# Patient Record
Sex: Male | Born: 1945 | Race: White | Hispanic: No | Marital: Married | Smoking: Former smoker
Health system: Southern US, Community
[De-identification: ages and names within clinical notes are randomized; demographics above are authoritative.]

## PROBLEM LIST (undated history)

## (undated) DIAGNOSIS — Z8719 Personal history of other diseases of the digestive system: Secondary | ICD-10-CM

## (undated) DIAGNOSIS — I350 Nonrheumatic aortic (valve) stenosis: Secondary | ICD-10-CM

## (undated) DIAGNOSIS — T8859XA Other complications of anesthesia, initial encounter: Secondary | ICD-10-CM

## (undated) DIAGNOSIS — T4145XA Adverse effect of unspecified anesthetic, initial encounter: Secondary | ICD-10-CM

## (undated) DIAGNOSIS — K219 Gastro-esophageal reflux disease without esophagitis: Secondary | ICD-10-CM

## (undated) DIAGNOSIS — D469 Myelodysplastic syndrome, unspecified: Secondary | ICD-10-CM

## (undated) DIAGNOSIS — M1990 Unspecified osteoarthritis, unspecified site: Secondary | ICD-10-CM

## (undated) DIAGNOSIS — I10 Essential (primary) hypertension: Secondary | ICD-10-CM

## (undated) DIAGNOSIS — I2510 Atherosclerotic heart disease of native coronary artery without angina pectoris: Secondary | ICD-10-CM

## (undated) DIAGNOSIS — E119 Type 2 diabetes mellitus without complications: Secondary | ICD-10-CM

## (undated) DIAGNOSIS — D649 Anemia, unspecified: Secondary | ICD-10-CM

## (undated) HISTORY — DX: Atherosclerotic heart disease of native coronary artery without angina pectoris: I25.10

---
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
## 2008-04-26 ENCOUNTER — Ambulatory Visit: Payer: Self-pay | Admitting: Internal Medicine

## 2008-08-26 ENCOUNTER — Ambulatory Visit: Payer: Self-pay

## 2008-11-26 HISTORY — PX: JOINT REPLACEMENT: SHX530

## 2010-02-20 ENCOUNTER — Ambulatory Visit: Payer: Self-pay | Admitting: General Practice

## 2010-03-14 ENCOUNTER — Inpatient Hospital Stay: Payer: Self-pay | Admitting: General Practice

## 2010-03-20 ENCOUNTER — Ambulatory Visit: Payer: Self-pay | Admitting: General Practice

## 2010-03-23 ENCOUNTER — Ambulatory Visit: Payer: Self-pay | Admitting: General Practice

## 2010-03-28 ENCOUNTER — Ambulatory Visit: Payer: Self-pay

## 2010-03-30 ENCOUNTER — Ambulatory Visit: Payer: Self-pay | Admitting: General Practice

## 2010-04-06 ENCOUNTER — Encounter: Payer: Self-pay | Admitting: General Practice

## 2010-04-26 ENCOUNTER — Encounter: Payer: Self-pay | Admitting: General Practice

## 2010-11-26 HISTORY — PX: CARPAL TUNNEL RELEASE: SHX101

## 2011-03-07 ENCOUNTER — Ambulatory Visit: Payer: Self-pay | Admitting: General Practice

## 2014-08-16 DIAGNOSIS — E782 Mixed hyperlipidemia: Secondary | ICD-10-CM | POA: Insufficient documentation

## 2014-08-16 DIAGNOSIS — I10 Essential (primary) hypertension: Secondary | ICD-10-CM | POA: Insufficient documentation

## 2014-08-16 DIAGNOSIS — N289 Disorder of kidney and ureter, unspecified: Secondary | ICD-10-CM | POA: Insufficient documentation

## 2016-02-14 DIAGNOSIS — E1121 Type 2 diabetes mellitus with diabetic nephropathy: Secondary | ICD-10-CM | POA: Insufficient documentation

## 2018-07-14 DIAGNOSIS — I482 Chronic atrial fibrillation, unspecified: Secondary | ICD-10-CM | POA: Insufficient documentation

## 2018-07-14 DIAGNOSIS — I6523 Occlusion and stenosis of bilateral carotid arteries: Secondary | ICD-10-CM | POA: Insufficient documentation

## 2019-07-22 ENCOUNTER — Other Ambulatory Visit: Payer: Self-pay

## 2019-07-22 ENCOUNTER — Encounter: Payer: Self-pay | Admitting: Dietician

## 2019-07-22 ENCOUNTER — Encounter: Payer: Medicare Other | Attending: Internal Medicine | Admitting: Dietician

## 2019-07-22 VITALS — Ht 69.0 in | Wt 229.1 lb

## 2019-07-22 DIAGNOSIS — E669 Obesity, unspecified: Secondary | ICD-10-CM | POA: Diagnosis not present

## 2019-07-22 DIAGNOSIS — Z713 Dietary counseling and surveillance: Secondary | ICD-10-CM | POA: Insufficient documentation

## 2019-07-22 DIAGNOSIS — E6609 Other obesity due to excess calories: Secondary | ICD-10-CM

## 2019-07-22 DIAGNOSIS — Z6833 Body mass index (BMI) 33.0-33.9, adult: Secondary | ICD-10-CM | POA: Diagnosis not present

## 2019-07-22 NOTE — Progress Notes (Signed)
Medical Nutrition Therapy: Visit start time: 0900  end time: 1000  Assessment:  Diagnosis: obesity Past medical history: Type 2 diabetes, HTN, HLD, gout Psychosocial issues/ stress concerns: none  Preferred learning method:  Nicki [place] . Hands-on  Current weight: 229.1lbs Height: 5'9.5" Medications, supplements: reconciled list in medical record  Progress and evaluation:   Patient reports stable weight for much of his adult life; not much change even when he changes his food intake;   He is working to lose weight now due to abdominal pressure affecting his breathing; he reports having a hiatal hernia but has been advised to lose weight prior to having hernia repair surgery.  Patient does not want to count calories or weigh foods, wants simple strategy.   Diabetes is in good control per patient, most BGs in 140s and HbA1C of 7%.   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   Wife has her own business and often is working until about 7pm evenings; patient eats simple and quick meals when eating alone.     Physical activity: ADLs -- yardwork and gardening  Dietary Intake:  Usual eating pattern includes 3 meals and 1-2 snacks per day. Dining out frequency: 1 meals per week.  Breakfast: cereal; occ biscuit if away from home in am, drinks lactose free skim milk Snack: Premier protein drink Lunch: cheese crackers Snack: usually none -- occasional piece of cake  Supper: quick, easy meal wife works late -- Community education officer; chicken in crock pot + green beans, peas, other vegetables (fresh or home canned); occasionally seafood (fried); loves ice cream Snack: none Beverages: water, diet coke, coffee or tea only rarely -- decaf with Stevia; orange juice with am meds; skim milk in am and occasionally for snack  Nutrition Care Education: Topics covered: weight control Basic nutrition: basic food groups, appropriate nutrient  balance, appropriate meal and snack schedule, general nutrition guidelines    Weight control: benefits of weight control, importance of low fat and low sugar food choices; portion control; calculated energy needs for weight loss at about 1500kcal and provided guidance for 45% CHO, 25% protein, and 30% fat; balanced meal options; role of physical activity; factors affecting weight and metabolism Diabetes:  appropriate meal and snack schedule, appropriate carb intake and balance  Nutritional Diagnosis:  Ken Caryl-3.3 Overweight/obesity As related to history of excess calories.  As evidenced by patient with current BMI of 33.8.  Intervention:   Instruction and discussion as noted above.  Patient states his weight today is several pounds less than at his last MD visit.  Established goals for improved nutritional balance in meals.   Patient will work on diet changes for 1-2 months, and schedule RD follow-up later if needed.  Education Materials given:  . Plate Planner with food lists . Sample meal pattern/ menus . Snacking handout . Goals/ instructions   Learner/ who was taught:  . Patient    Level of understanding: Marland Kitchen Verbalizes/ demonstrates competency   Demonstrated degree of understanding via:   Teach back Learning barriers: . None   Willingness to learn/ readiness for change: . Eager, change in progress   Monitoring and Evaluation:  Dietary intake, exercise, BG control, and body weight      follow up: prn

## 2019-07-22 NOTE — Patient Instructions (Addendum)
   Include a source of protein with each meal; can be simple such as some nuts or lowfat cheese.   Use menus to plan for healthy nutritional balance in meals.   Stay active each day to keep up metabolism/ calorie burning.

## 2019-10-07 ENCOUNTER — Emergency Department: Payer: Medicare Other

## 2019-10-07 ENCOUNTER — Emergency Department
Admission: EM | Admit: 2019-10-07 | Discharge: 2019-10-07 | Disposition: A | Discharge status: Home / Self Care | Payer: Medicare Other | Attending: Emergency Medicine | Admitting: Emergency Medicine

## 2019-10-07 ENCOUNTER — Other Ambulatory Visit: Payer: Self-pay

## 2019-10-07 ENCOUNTER — Encounter: Payer: Self-pay | Admitting: Emergency Medicine

## 2019-10-07 DIAGNOSIS — Y9389 Activity, other specified: Secondary | ICD-10-CM | POA: Diagnosis not present

## 2019-10-07 DIAGNOSIS — R202 Paresthesia of skin: Secondary | ICD-10-CM | POA: Insufficient documentation

## 2019-10-07 DIAGNOSIS — R200 Anesthesia of skin: Secondary | ICD-10-CM | POA: Diagnosis not present

## 2019-10-07 DIAGNOSIS — Y998 Other external cause status: Secondary | ICD-10-CM | POA: Diagnosis not present

## 2019-10-07 DIAGNOSIS — M25511 Pain in right shoulder: Secondary | ICD-10-CM | POA: Diagnosis not present

## 2019-10-07 DIAGNOSIS — Y92410 Unspecified street and highway as the place of occurrence of the external cause: Secondary | ICD-10-CM | POA: Insufficient documentation

## 2019-10-07 DIAGNOSIS — M542 Cervicalgia: Secondary | ICD-10-CM | POA: Insufficient documentation

## 2019-10-07 DIAGNOSIS — Z87891 Personal history of nicotine dependence: Secondary | ICD-10-CM | POA: Diagnosis not present

## 2019-10-07 DIAGNOSIS — Z7901 Long term (current) use of anticoagulants: Secondary | ICD-10-CM | POA: Insufficient documentation

## 2019-10-07 DIAGNOSIS — Z7984 Long term (current) use of oral hypoglycemic drugs: Secondary | ICD-10-CM | POA: Insufficient documentation

## 2019-10-07 DIAGNOSIS — Z79899 Other long term (current) drug therapy: Secondary | ICD-10-CM | POA: Diagnosis not present

## 2019-10-07 MED ORDER — ONDANSETRON HCL 4 MG PO TABS: 4.0000 mg | ORAL_TABLET | Freq: Three times a day (TID) | ORAL | 0 refills | Status: AC | PRN

## 2019-10-07 MED ORDER — HYDROCODONE-ACETAMINOPHEN 5-325 MG PO TABS: 1.0000 | ORAL_TABLET | Freq: Four times a day (QID) | ORAL | 0 refills | Status: AC | PRN

## 2019-10-07 MED ORDER — ONDANSETRON 4 MG PO TBDP
4.0000 mg | ORAL_TABLET | Freq: Once | ORAL | Status: AC
  Administered 2019-10-07: 4 mg via ORAL
  Filled 2019-10-07: qty 1

## 2019-10-07 MED ORDER — OXYCODONE-ACETAMINOPHEN 5-325 MG PO TABS
1.0000 | ORAL_TABLET | Freq: Once | ORAL | Status: AC
  Administered 2019-10-07: 18:00:00 1 via ORAL
  Filled 2019-10-07: qty 1
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
## 2019-10-21 ENCOUNTER — Other Ambulatory Visit: Payer: Self-pay | Admitting: Orthopedic Surgery

## 2019-10-21 DIAGNOSIS — M25511 Pain in right shoulder: Secondary | ICD-10-CM

## 2019-10-27 ENCOUNTER — Other Ambulatory Visit: Payer: Self-pay

## 2019-10-27 ENCOUNTER — Ambulatory Visit
Admission: RE | Admit: 2019-10-27 | Discharge: 2019-10-27 | Disposition: A | Discharge status: Home / Self Care | Payer: Medicare Other | Source: Ambulatory Visit | Attending: Orthopedic Surgery | Admitting: Orthopedic Surgery

## 2019-10-27 DIAGNOSIS — M25511 Pain in right shoulder: Secondary | ICD-10-CM

## 2019-11-02 ENCOUNTER — Other Ambulatory Visit: Payer: Self-pay | Admitting: Orthopedic Surgery

## 2019-11-05 ENCOUNTER — Other Ambulatory Visit: Payer: Self-pay

## 2019-11-05 ENCOUNTER — Encounter
Admission: RE | Admit: 2019-11-05 | Discharge: 2019-11-05 | Disposition: A | Discharge status: Home / Self Care | Payer: Medicare Other | Source: Ambulatory Visit | Attending: Orthopedic Surgery | Admitting: Orthopedic Surgery

## 2019-11-05 DIAGNOSIS — Z20828 Contact with and (suspected) exposure to other viral communicable diseases: Secondary | ICD-10-CM | POA: Insufficient documentation

## 2019-11-05 DIAGNOSIS — I4891 Unspecified atrial fibrillation: Secondary | ICD-10-CM | POA: Diagnosis not present

## 2019-11-05 DIAGNOSIS — Z01818 Encounter for other preprocedural examination: Secondary | ICD-10-CM | POA: Diagnosis present

## 2019-11-05 DIAGNOSIS — R001 Bradycardia, unspecified: Secondary | ICD-10-CM | POA: Insufficient documentation

## 2019-11-05 HISTORY — DX: Unspecified osteoarthritis, unspecified site: M19.90

## 2019-11-05 HISTORY — DX: Other complications of anesthesia, initial encounter: T88.59XA

## 2019-11-05 HISTORY — DX: Gastro-esophageal reflux disease without esophagitis: K21.9

## 2019-11-05 HISTORY — DX: Personal history of other diseases of the digestive system: Z87.19

## 2019-11-05 HISTORY — DX: Type 2 diabetes mellitus without complications: E11.9

## 2019-11-05 HISTORY — DX: Essential (primary) hypertension: I10

## 2019-11-05 HISTORY — DX: Anemia, unspecified: D64.9

## 2019-11-05 LAB — HEMOGLOBIN: Hemoglobin: 9.4 g/dL — ABNORMAL LOW (ref 13.0–17.0)

## 2019-11-05 LAB — SARS CORONAVIRUS 2 (TAT 6-24 HRS): SARS Coronavirus 2: NEGATIVE

## 2019-11-05 NOTE — Pre-Procedure Instructions (Signed)
ECG 12-lead9/30/2020 [place] Component Name Value Ref Range  Vent Rate (bpm) 40   QRS Interval (msec) 70   QT Interval (msec) 452   QTc (msec) 368   Other Result Information  This result has an attachment that is not available.  Result Narrative  Atrial fibrillation with slow ventricular response Low voltage QRS Septal infarct (cited on or before 14-Jul-2018) Abnormal ECG When compared with ECG of 14-Jul-2018 11:40, No significant change was found I reviewed and concur with this report. Electronically signed [id number] MD, Darnell Level [phone]) on 09/01/2019 2:08:30 PM  Status Results Details   Encounter Summary
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
## 2019-11-05 NOTE — Patient Instructions (Signed)
Your procedure is scheduled on: 11-09-19 MONDAY Report to Same Day Surgery 2nd floor medical mall [facility] Entrance-take elevator on left to 2nd floor.  Check in with surgery information desk.) To find out your arrival time please call [phone] between 1PM - 3PM on 11-06-19 FRIDAY  Remember: Instructions that are not followed completely may result in serious medical risk, up to and including death, or upon the discretion of your surgeon and anesthesiologist your surgery may need to be rescheduled.    _x___ 1. Do not eat food after midnight the night before your procedure. NO GUM OR CANDY AFTER MIDNIGHT. You may drink WATER up to 2 hours before you are scheduled to arrive at the hospital for your procedure.  Do not drink WATER within 2 hours of your scheduled arrival to the hospital.  Type 1 and type 2 diabetics should only drink water.   ____Ensure clear carbohydrate drink on the way to the hospital for bariatric patients  _X___GATORADE G2 drink 3 hours PRIOR TO ARRIVAL TIME TO HOSPITAL     __x__ 2. No Alcohol for 24 hours before or after surgery.   __x__3. No Smoking or e-cigarettes for 24 prior to surgery.  Do not use any chewable tobacco products for at least 6 hour prior to surgery   ____  4. Bring all medications with you on the day of surgery if instructed.    __x__ 5. Notify your doctor if there is any change in your medical condition     (cold, fever, infections).    x___6. On the morning of surgery brush your teeth with toothpaste and water.  You may rinse your mouth with mouth wash if you wish.  Do not swallow any toothpaste or mouthwash.   Do not wear jewelry, make-up, hairpins, clips or nail polish.  Do not wear lotions, powders, or perfumes.   Do not shave 48 hours prior to surgery. Men may shave face and neck.  Do not bring valuables to the hospital.    [facility] is not responsible for any belongings or valuables.               Contacts, dentures or  bridgework may not be worn into surgery.  Leave your suitcase in the car. After surgery it may be brought to your room.  For patients admitted to the hospital, discharge time is determined by your  treatment team.  _  Patients discharged the day of surgery will not be allowed to drive home.  You will need someone to drive you home and stay with you the night of your procedure.    Please read over the following fact sheets that you were given:   [facility] Preparing for Surgery   _x___ TAKE THE FOLLOWING MEDICATION THE MORNING OF SURGERY WITH A SMALL SIP OF WATER. These include:  1. METOPROLOL  2.  3.  4.  5.  6.  ____Fleets enema or Magnesium Citrate as directed.   _x___ Use CHG Soap or sage wipes as directed on instruction sheet   ____ Use inhalers on the day of surgery and bring to hospital day of surgery  _X___ Stop Metformin 2 days prior to surgery-LAST DOSE ON Friday 11-06-19    ____ Take 1/2 of usual insulin dose the night before surgery and none on the morning surgery.   _x___ Follow recommendations from Cardiologist, Pulmonologist or PCP regarding stopping Aspirin, Coumadin, Plavix ,Eliquis, Effient, or Pradaxa, and Pletal-PT WAS INSTRUCTED BY OFFICE TO STOP ELIQUIS  3 DAYS PRIOR TO SURGERY-LAST DOSE TODAY (11-05-19)  X____Stop Anti-inflammatories such as Advil, Aleve, Ibuprofen, Motrin, Naproxen, Naprosyn, Goodies powders or aspirin products NOW- OK to take Tylenol    ____ Stop supplements until after surgery.    ____ Bring C-Pap to the hospital.

## 2019-11-05 NOTE — Pre-Procedure Instructions (Signed)
Progress Notes - documented in this encounter Vernon [name], Vernon [name] - 08/26/2019 11:00 AM EDT Formatting of this note might be different from the original. Established Patient Visit   Chief Complaint: Chief Complaint  Patient presents with  . Atrial Fibrillation  Date of Service: 08/26/2019 Date of Birth: [birth year] PCP: Velna Ochs, MD   History of Present Illness: Vernon [name] is a 73 y.o. male with a history of permanent atrial fibrillation, anticoagulated on Eliquis, HTN, HLD, and diabetes mellitus, who presents for a routine six month follow up visit, feeling generally well from a cardiac standpoint. However, he does report having ongoing fatigue, which has been gradually progressive during the last six to eight months. If he is not outside working, he feels very sleepy and tired during the day. He has continued to stay active despite this fatigue, working on his farm on a daily basis. He denies any obvious changes to his exercise tolerance. He also denies exertional chest pain or shortness of breath. He also denies any lightheadedness, syncope, palpitations, or leg swelling.   Of note, he is currently on metoprolol tartrate 50 mg BID for heart rate control. His heart rate is 45 BPM today. His heart rate has been similarly low during the last few visits.   Past Medical and Surgical History  Past Medical History Past Medical History:  Diagnosis Date  . Anemia  . Complete rupture of rotator cuff  . 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 Obesity  . Osteoarthrosis, unspecified whether generalized or localized, lower leg  . Renal insufficiency   Past Surgical History He has a past surgical history that includes Knee arthroscopy (Right) and Endoscopic Carpal Tunnel Release (Left).   Medications and Allergies  Current Medications  Current Outpatient Medications  Medication Sig Dispense Refill  . allopurinol (ZYLOPRIM) 300 MG tablet Take 300 mg by mouth once daily.  Marland Kitchen  apixaban (ELIQUIS) 5 mg tablet Take 1 tablet (5 mg total) by mouth 2 (two) times daily. 60 tablet 11  . ferrous sulfate 325 (65 FE) MG tablet Take 325 mg by mouth daily with breakfast  . flash glucose sensor Kit Use 1 kit as needed. 1 kit 0  . lisinopril (PRINIVIL,ZESTRIL) 40 MG tablet Take 40 mg by mouth once daily.  . metFORMIN (GLUCOPHAGE) 500 MG tablet Take 1 tablet (500 mg total) by mouth 2 (two) times daily with meals 180 tablet 3  . metoprolol tartrate (LOPRESSOR) 50 MG tablet Take 0.5 tablets (25 mg total) by mouth 2 (two) times daily 45 tablet 1  . multivit-mins/iron/folic/lycop (CENTRUM MEN ORAL) Take 1 tablet by mouth once daily  . simvastatin (ZOCOR) 80 MG tablet Take 40 mg by mouth nightly.   No current facility-administered medications for this visit.   Allergies: Patient has no known allergies.  Social and Family History  Social History reports that he quit smoking about 50 years ago. His smoking use included cigarettes. He has a 1.00 pack-year smoking history. He has never used smokeless tobacco. He reports that he does not drink alcohol.  Family History Family History  Problem Relation Age of Onset  . Stroke Mother   Review of Systems   Review of Systems:  The patient denies chest pain, shortness of breath, orthopnea, paroxysmal nocturnal dyspnea, pedal edema, palpitations, heart racing, dizziness, lightheadedness, presyncope, syncope, leg pain, leg cramping.  Review of 12 Systems is negative except as described above.  Physical Examination   Vitals:BP 112/64 (BP Location: Left upper arm, Patient Position:  Sitting, BP Cuff Size: Adult)  Pulse (!) 45  Resp 14  Ht 175.3 cm (_0 )  Wt (!) 106 kg (233 lb 9.6 oz)  SpO2 92%  BMI 34.50 kg/m  Ht:175.3 cm (_1 ) Wt:(!) 106 kg (233 lb 9.6 oz) ZLD:JTTS surface area is 2.27 meters squared. Body mass index is 34.5 kg/m.  Constitutional: Alert, well developed, in no acute distress HEENT: Pupils equally reactive to  light and accomodation  Neck: Supple. Carotid pulse 2+ bilaterally Lungs: Clear to auscultation bilaterally; no wheezes, rales, rhonchi Heart: Regular rate and rhythm. 3/6 systolic murmur heard throughout precordium with radiation to the carotid arteries.  Extremities: No lower extremity edema. No cyanosis.  Peripheral Pulses: 2+ in upper extremities, 2+ in lower extremities   Assessment   73 y.o. male with  1. Chronic a-fib (CMS-HCC)  2. Essential hypertension, benign  3. Type 2 diabetes with nephropathy (CMS-HCC)  4. Hyperlipemia, mixed   EKG as read by me: Atrial fibrillation, Rate of 40 BPM, Normal axis, diffuse T wave flattening ( no significant change from prior EKG done in 06/2018)   Plan   1. Permanent atrial fibrillation: a. Fatigue - CHADS-VASc score of 3 (age 32-74 - 1, HTN - 1, DM - 1). He remains anticoagulated with Eliquis 5 mg BID. No dose reduction needed given age <80 years, weight >60 kg, and creatinine <1.5. He reports no bleeding symptoms while on Eliquis.  - Rate controlled with metoprolol tartrate 50 mg BID. Heart rate was 45 BPM in clinic today. Review of prior office vitals shows bradycardia in the 40s during last two visits. Patient also reports increased fatigue during the last six months, so we will plan to decrease metoprolol dosing to minimize bradycardia and fatigue.  - Decrease metoprolol from 50 mg BID to 25 mg BID  - Return to clinic in 4 weeks, plan to reassess heart rate. If he remains bradycardic, will likely discontinue daily metoprolol use and only use PRN. Consider holter monitoring if patient remains bradycardic without use of beta blocker to evaluate for sick sinus syndrome.   2. HTN: Currently on lisinopril 40 mg daily and metoprolol tartrate 50 mg BID for blood pressure control. Blood pressure is well controlled today. Continue current regimen.   3. HLD: Currently on moderate intensity statin therapy with simvastatin 80 mg daily. Last lipid  panel from 02/2019 reviewed - total cholesterol 79, HDL 36, LDL 26. Tolerating statin therapy well. Continue current regimen.   4. Diabetes mellitus:  Last A1c from 05/2019 was 7.1%. Goal A1c is between <7.0% to minimize cardiovascular risk. Continue current medications, diet, and lifestyle modifications.  Orders Placed This Encounter  Procedures  . ECG 12-lead   Return in about 4 weeks (around 09/23/2019) for [place].  MICHELLE Jimmey Ralph, PA-C    Electronically signed by Vernon [name], [place] at 08/26/2019 2:12 PM EDT

## 2019-11-09 ENCOUNTER — Ambulatory Visit: Payer: Medicare Other | Admitting: Anesthesiology

## 2019-11-09 ENCOUNTER — Encounter: Payer: Self-pay | Admitting: Orthopedic Surgery

## 2019-11-09 ENCOUNTER — Ambulatory Visit: Payer: Medicare Other

## 2019-11-09 ENCOUNTER — Encounter
Admission: RE | Disposition: A | Discharge status: Home / Self Care | Payer: Self-pay | Source: Home / Self Care | Attending: Orthopedic Surgery

## 2019-11-09 ENCOUNTER — Ambulatory Visit
Admission: RE | Admit: 2019-11-09 | Discharge: 2019-11-09 | Disposition: A | Discharge status: Home / Self Care | Payer: Medicare Other | Attending: Orthopedic Surgery | Admitting: Orthopedic Surgery

## 2019-11-09 ENCOUNTER — Other Ambulatory Visit: Payer: Self-pay

## 2019-11-09 DIAGNOSIS — I10 Essential (primary) hypertension: Secondary | ICD-10-CM | POA: Diagnosis not present

## 2019-11-09 DIAGNOSIS — Z9889 Other specified postprocedural states: Secondary | ICD-10-CM

## 2019-11-09 DIAGNOSIS — Z6832 Body mass index (BMI) 32.0-32.9, adult: Secondary | ICD-10-CM | POA: Insufficient documentation

## 2019-11-09 DIAGNOSIS — Z7901 Long term (current) use of anticoagulants: Secondary | ICD-10-CM | POA: Diagnosis not present

## 2019-11-09 DIAGNOSIS — S46211A Strain of muscle, fascia and tendon of other parts of biceps, right arm, initial encounter: Secondary | ICD-10-CM | POA: Diagnosis not present

## 2019-11-09 DIAGNOSIS — S46011A Strain of muscle(s) and tendon(s) of the rotator cuff of right shoulder, initial encounter: Secondary | ICD-10-CM | POA: Insufficient documentation

## 2019-11-09 DIAGNOSIS — E669 Obesity, unspecified: Secondary | ICD-10-CM | POA: Diagnosis not present

## 2019-11-09 DIAGNOSIS — E119 Type 2 diabetes mellitus without complications: Secondary | ICD-10-CM | POA: Insufficient documentation

## 2019-11-09 DIAGNOSIS — M25811 Other specified joint disorders, right shoulder: Secondary | ICD-10-CM | POA: Insufficient documentation

## 2019-11-09 DIAGNOSIS — I4891 Unspecified atrial fibrillation: Secondary | ICD-10-CM | POA: Insufficient documentation

## 2019-11-09 DIAGNOSIS — M1990 Unspecified osteoarthritis, unspecified site: Secondary | ICD-10-CM | POA: Diagnosis not present

## 2019-11-09 HISTORY — PX: SHOULDER ARTHROSCOPY WITH ROTATOR CUFF REPAIR AND OPEN BICEPS TENODESIS: SHX6677

## 2019-11-09 LAB — GLUCOSE, CAPILLARY
Glucose-Capillary: 137 mg/dL — ABNORMAL HIGH (ref 70–99)
Glucose-Capillary: 140 mg/dL — ABNORMAL HIGH (ref 70–99)

## 2019-11-09 SURGERY — SHOULDER ARTHROSCOPY WITH ROTATOR CUFF REPAIR AND OPEN BICEPS TENODESIS
Anesthesia: General | Laterality: Right

## 2019-11-09 MED ORDER — ONDANSETRON 4 MG PO TBDP: 4.0000 mg | ORAL_TABLET | Freq: Three times a day (TID) | ORAL | 0 refills | Status: DC | PRN

## 2019-11-09 MED ORDER — BUPIVACAINE LIPOSOME 1.3 % IJ SUSP
INTRAMUSCULAR | Status: DC | PRN
  Administered 2019-11-09: 20 mL via PERINEURAL

## 2019-11-09 MED ORDER — BUPIVACAINE-EPINEPHRINE (PF) 0.5% -1:200000 IJ SOLN
INTRAMUSCULAR | Status: AC
  Filled 2019-11-09: qty 30

## 2019-11-09 MED ORDER — FAMOTIDINE 20 MG PO TABS
ORAL_TABLET | ORAL | Status: AC
  Filled 2019-11-09: qty 1

## 2019-11-09 MED ORDER — EPINEPHRINE PF 1 MG/ML IJ SOLN
INTRAMUSCULAR | Status: AC
  Filled 2019-11-09: qty 4

## 2019-11-09 MED ORDER — SODIUM CHLORIDE 0.9 % IV SOLN
INTRAVENOUS | Status: DC
  Administered 2019-11-09 (×2): via INTRAVENOUS

## 2019-11-09 MED ORDER — FAMOTIDINE 20 MG PO TABS
20.0000 mg | ORAL_TABLET | Freq: Once | ORAL | Status: AC
  Administered 2019-11-09: 20 mg via ORAL

## 2019-11-09 MED ORDER — OXYCODONE HCL 5 MG PO TABS: 5.0000 mg | ORAL_TABLET | ORAL | 0 refills | Status: DC | PRN

## 2019-11-09 MED ORDER — LIDOCAINE HCL (PF) 2 % IJ SOLN
INTRAMUSCULAR | Status: AC
  Filled 2019-11-09: qty 10

## 2019-11-09 MED ORDER — NEOMYCIN-POLYMYXIN B GU 40-200000 IR SOLN
Status: AC
  Filled 2019-11-09: qty 20

## 2019-11-09 MED ORDER — PROPOFOL 10 MG/ML IV BOLUS
INTRAVENOUS | Status: DC | PRN
  Administered 2019-11-09: 20 mg via INTRAVENOUS
  Administered 2019-11-09: 160 mg via INTRAVENOUS

## 2019-11-09 MED ORDER — CEFAZOLIN SODIUM-DEXTROSE 2-4 GM/100ML-% IV SOLN
INTRAVENOUS | Status: AC
  Filled 2019-11-09: qty 100

## 2019-11-09 MED ORDER — LIDOCAINE HCL (CARDIAC) PF 100 MG/5ML IV SOSY
PREFILLED_SYRINGE | INTRAVENOUS | Status: DC | PRN
  Administered 2019-11-09: 100 mg via INTRAVENOUS

## 2019-11-09 MED ORDER — MIDAZOLAM HCL 2 MG/2ML IJ SOLN
INTRAMUSCULAR | Status: AC
  Filled 2019-11-09: qty 2

## 2019-11-09 MED ORDER — ONDANSETRON HCL 4 MG/2ML IJ SOLN: 4.0000 mg | Freq: Once | INTRAMUSCULAR | Status: DC | PRN

## 2019-11-09 MED ORDER — FENTANYL CITRATE (PF) 100 MCG/2ML IJ SOLN
50.0000 ug | Freq: Once | INTRAMUSCULAR | Status: AC
  Administered 2019-11-09: 50 ug via INTRAVENOUS

## 2019-11-09 MED ORDER — PHENYLEPHRINE HCL (PRESSORS) 10 MG/ML IV SOLN
INTRAVENOUS | Status: AC
  Filled 2019-11-09: qty 1

## 2019-11-09 MED ORDER — CEFAZOLIN SODIUM-DEXTROSE 2-4 GM/100ML-% IV SOLN
2.0000 g | INTRAVENOUS | Status: AC
  Administered 2019-11-09: 2 g via INTRAVENOUS

## 2019-11-09 MED ORDER — EPHEDRINE SULFATE 50 MG/ML IJ SOLN
INTRAMUSCULAR | Status: DC | PRN
  Administered 2019-11-09 (×2): 5 mg via INTRAVENOUS

## 2019-11-09 MED ORDER — BUPIVACAINE LIPOSOME 1.3 % IJ SUSP
INTRAMUSCULAR | Status: AC
  Filled 2019-11-09: qty 20

## 2019-11-09 MED ORDER — FENTANYL CITRATE (PF) 100 MCG/2ML IJ SOLN: 25.0000 ug | INTRAMUSCULAR | Status: DC | PRN

## 2019-11-09 MED ORDER — CHLORHEXIDINE GLUCONATE 4 % EX LIQD
60.0000 mL | Freq: Once | CUTANEOUS | Status: AC
  Administered 2019-11-09: 4 via TOPICAL

## 2019-11-09 MED ORDER — LIDOCAINE HCL (PF) 1 % IJ SOLN
INTRAMUSCULAR | Status: DC | PRN
  Administered 2019-11-09: 3 mL

## 2019-11-09 MED ORDER — LIDOCAINE HCL (PF) 1 % IJ SOLN
INTRAMUSCULAR | Status: AC
  Filled 2019-11-09: qty 30

## 2019-11-09 MED ORDER — BUPIVACAINE HCL (PF) 0.5 % IJ SOLN
INTRAMUSCULAR | Status: DC | PRN
  Administered 2019-11-09: 10 mL

## 2019-11-09 MED ORDER — MIDAZOLAM HCL 2 MG/2ML IJ SOLN
1.0000 mg | Freq: Once | INTRAMUSCULAR | Status: AC
  Administered 2019-11-09: 1 mg via INTRAVENOUS

## 2019-11-09 MED ORDER — ACETAMINOPHEN 500 MG PO TABS: 1000.0000 mg | ORAL_TABLET | Freq: Three times a day (TID) | ORAL | 2 refills | Status: DC

## 2019-11-09 MED ORDER — SUGAMMADEX SODIUM 200 MG/2ML IV SOLN
INTRAVENOUS | Status: AC
  Filled 2019-11-09: qty 2

## 2019-11-09 MED ORDER — ROCURONIUM BROMIDE 50 MG/5ML IV SOLN
INTRAVENOUS | Status: AC
  Filled 2019-11-09: qty 2

## 2019-11-09 MED ORDER — GLYCOPYRROLATE 0.2 MG/ML IJ SOLN
INTRAMUSCULAR | Status: AC
  Filled 2019-11-09: qty 1

## 2019-11-09 MED ORDER — ONDANSETRON HCL 4 MG/2ML IJ SOLN
INTRAMUSCULAR | Status: DC | PRN
  Administered 2019-11-09: 4 mg via INTRAVENOUS

## 2019-11-09 MED ORDER — GLYCOPYRROLATE 0.2 MG/ML IJ SOLN
INTRAMUSCULAR | Status: DC | PRN
  Administered 2019-11-09: .2 mg via INTRAVENOUS

## 2019-11-09 MED ORDER — LACTATED RINGERS IV SOLN
INTRAVENOUS | Status: DC | PRN
  Administered 2019-11-09: 08:00:00 4 mL

## 2019-11-09 MED ORDER — NEOMYCIN-POLYMYXIN B GU 40-200000 IR SOLN
Status: DC | PRN
  Administered 2019-11-09: 2 mL

## 2019-11-09 MED ORDER — BUPIVACAINE HCL (PF) 0.5 % IJ SOLN
INTRAMUSCULAR | Status: AC
  Filled 2019-11-09: qty 10

## 2019-11-09 MED ORDER — FENTANYL CITRATE (PF) 100 MCG/2ML IJ SOLN
INTRAMUSCULAR | Status: AC
  Filled 2019-11-09: qty 2

## 2019-11-09 MED ORDER — ROCURONIUM BROMIDE 100 MG/10ML IV SOLN
INTRAVENOUS | Status: DC | PRN
  Administered 2019-11-09: 20 mg via INTRAVENOUS
  Administered 2019-11-09: 80 mg via INTRAVENOUS

## 2019-11-09 MED ORDER — SUGAMMADEX SODIUM 200 MG/2ML IV SOLN
INTRAVENOUS | Status: DC | PRN
  Administered 2019-11-09: 200 mg via INTRAVENOUS

## 2019-11-09 MED ORDER — PHENYLEPHRINE HCL-NACL 10-0.9 MG/250ML-% IV SOLN
INTRAVENOUS | Status: DC | PRN
  Administered 2019-11-09: 30 ug/min via INTRAVENOUS

## 2019-11-09 MED ORDER — FENTANYL CITRATE (PF) 250 MCG/5ML IJ SOLN
INTRAMUSCULAR | Status: AC
  Filled 2019-11-09: qty 5

## 2019-11-09 MED ORDER — PROPOFOL 10 MG/ML IV BOLUS
INTRAVENOUS | Status: AC
  Filled 2019-11-09: qty 20

## 2019-11-09 MED ORDER — FENTANYL CITRATE (PF) 100 MCG/2ML IJ SOLN
INTRAMUSCULAR | Status: DC | PRN
  Administered 2019-11-09: 25 ug via INTRAVENOUS
  Administered 2019-11-09: 50 ug via INTRAVENOUS
  Administered 2019-11-09: 25 ug via INTRAVENOUS

## 2019-11-09 MED ORDER — LIDOCAINE HCL (PF) 1 % IJ SOLN
INTRAMUSCULAR | Status: AC
  Filled 2019-11-09: qty 5

## 2019-11-09 SURGICAL SUPPLY — 71 items
ADAPTER IRRIG TUBE 2 SPIKE SOL (ADAPTER) ×4 IMPLANT
ANCHOR 2.3 SP SGL 1.2 XBRAID (Anchor) ×4 IMPLANT
ANCHOR SUT BIO SW 4.75X19.1 (Anchor) ×2 IMPLANT
BRUSH SCRUB EZ  4% CHG (MISCELLANEOUS)
BRUSH SCRUB EZ 4% CHG (MISCELLANEOUS) IMPLANT
BUR BR 5.5 12 FLUTE (BURR) IMPLANT
BUR RADIUS 4.0X18.5 (BURR) ×2 IMPLANT
BUR RADIUS 5.5 (BURR) IMPLANT
CANNULA PART THRD DISP 5.75X7 (CANNULA) ×4 IMPLANT
CANNULA PARTIAL THREAD 2X7 (CANNULA) ×4 IMPLANT
CANNULA TWIST IN 8.25X9CM (CANNULA) IMPLANT
CHLORAPREP W/TINT 26 (MISCELLANEOUS) ×2 IMPLANT
COOLER ICEMAN CLASSIC (MISCELLANEOUS) ×2 IMPLANT
COVER WAND RF STERILE (DRAPES) ×2 IMPLANT
CRADLE LAMINECT ARM (MISCELLANEOUS) ×2 IMPLANT
DEVICE SUCT BLK HOLE OR FLOOR (MISCELLANEOUS) IMPLANT
DRAPE 3/4 80X56 (DRAPES) ×2 IMPLANT
DRAPE INCISE IOBAN 66X45 STRL (DRAPES) ×2 IMPLANT
DRAPE SPLIT 6X30 W/TAPE (DRAPES) ×4 IMPLANT
DRAPE STERI 35X30 U-POUCH (DRAPES) ×2 IMPLANT
DRSG TEGADERM 4X4.75 (GAUZE/BANDAGES/DRESSINGS) ×4 IMPLANT
ELECT REM PT RETURN 9FT ADLT (ELECTROSURGICAL)
ELECTRODE REM PT RTRN 9FT ADLT (ELECTROSURGICAL) IMPLANT
GAUZE SPONGE 4X4 12PLY STRL (GAUZE/BANDAGES/DRESSINGS) ×2 IMPLANT
GAUZE XEROFORM 1X8 LF (GAUZE/BANDAGES/DRESSINGS) ×2 IMPLANT
GLOVE BIO SURGEON STRL SZ7.5 (GLOVE) ×2 IMPLANT
GLOVE BIOGEL PI IND STRL 8 (GLOVE) ×2 IMPLANT
GLOVE BIOGEL PI INDICATOR 8 (GLOVE) ×2
GLOVE SURG SYN 7.5  E (GLOVE) ×1
GLOVE SURG SYN 7.5 E (GLOVE) ×1 IMPLANT
GOWN STRL REUS W/ TWL LRG LVL3 (GOWN DISPOSABLE) ×2 IMPLANT
GOWN STRL REUS W/TWL LRG LVL3 (GOWN DISPOSABLE) ×2
GOWN STRL REUS W/TWL LRG LVL4 (GOWN DISPOSABLE) ×2 IMPLANT
IV LACTATED RINGER IRRG 3000ML (IV SOLUTION) ×10
IV LR IRRIG 3000ML ARTHROMATIC (IV SOLUTION) ×10 IMPLANT
KIT STABILIZATION SHOULDER (MISCELLANEOUS) ×2 IMPLANT
KIT SUTURETAK 3.0 INSERT PERC (KITS) IMPLANT
KIT TURNOVER KIT A (KITS) ×2 IMPLANT
MANIFOLD NEPTUNE II (INSTRUMENTS) ×2 IMPLANT
MASK FACE SPIDER DISP (MASK) ×2 IMPLANT
MAT ABSORB  FLUID 56X50 GRAY (MISCELLANEOUS) ×2
MAT ABSORB FLUID 56X50 GRAY (MISCELLANEOUS) ×2 IMPLANT
NDL SAFETY ECLIPSE 18X1.5 (NEEDLE) ×1 IMPLANT
NEEDLE HYPO 18GX1.5 SHARP (NEEDLE) ×1
NEEDLE HYPO 22GX1.5 SAFETY (NEEDLE) ×2 IMPLANT
NEEDLE SCORPION MULTI FIRE (NEEDLE) ×2 IMPLANT
NS IRRIG 500ML POUR BTL (IV SOLUTION) ×2 IMPLANT
PACK ARTHROSCOPY SHOULDER (MISCELLANEOUS) ×2 IMPLANT
PAD ABD DERMACEA PRESS 5X9 (GAUZE/BANDAGES/DRESSINGS) ×2 IMPLANT
PAD WRAPON POLAR SHDR XLG (MISCELLANEOUS) ×1 IMPLANT
PENCIL SMOKE ULTRAEVAC 22 CON (MISCELLANEOUS) ×2 IMPLANT
SET TUBE SUCT SHAVER OUTFL 24K (TUBING) ×2 IMPLANT
SET TUBE TIP INTRA-ARTICULAR (MISCELLANEOUS) ×2 IMPLANT
SLING ULTRA II M (MISCELLANEOUS) IMPLANT
STRAP SAFETY 5IN WIDE (MISCELLANEOUS) ×2 IMPLANT
SUT ETHILON 3-0 FS-10 30 BLK (SUTURE) ×2
SUT LASSO 90 DEG SD STR (SUTURE) IMPLANT
SUT MNCRL 4-0 (SUTURE) ×1
SUT MNCRL 4-0 27XMFL (SUTURE) ×1
SUT PDS AB 0 CT1 27 (SUTURE) ×2 IMPLANT
SUTURE EHLN 3-0 FS-10 30 BLK (SUTURE) ×1 IMPLANT
SUTURE MNCRL 4-0 27XMF (SUTURE) ×1 IMPLANT
SUTURE TAPE 1.3 40 TPR END (SUTURE) ×2 IMPLANT
SUTURETAPE 1.3 40 TPR END (SUTURE) ×4
SYR 10ML LL (SYRINGE) ×2 IMPLANT
SYSTEM ANCHOR SUT KNTLESS 4.75 (Anchor) ×4 IMPLANT
TAPE CLOTH 3X10 WHT NS LF (GAUZE/BANDAGES/DRESSINGS) ×2 IMPLANT
TUBING ARTHRO INFLOW-ONLY STRL (TUBING) ×2 IMPLANT
TUBING CONNECTING 10 (TUBING) ×2 IMPLANT
WAND WEREWOLF FLOW 90D (MISCELLANEOUS) IMPLANT
WRAPON POLAR PAD SHDR XLG (MISCELLANEOUS) ×2
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
## 2019-11-09 NOTE — Anesthesia Post-op Follow-up Note (Signed)
Anesthesia QCDR form completed.        

## 2019-11-09 NOTE — Anesthesia Postprocedure Evaluation (Signed)
Anesthesia Post Note  Patient: Vernon [name]  Procedure(s) Performed: RIGHT SHOULDER ARTHROSCOPY WITH SUBSCAPULARIS REPAIR, SUBACROMIAL DECOMPRESSION,MINI OPEN ROTATOR CUFF REPAIR (Right )  Patient location during evaluation: PACU Anesthesia Type: General Level of consciousness: awake and alert and oriented Pain management: pain level controlled Vital Signs Assessment: post-procedure vital signs reviewed and stable Respiratory status: spontaneous breathing, nonlabored ventilation and respiratory function stable Cardiovascular status: blood pressure returned to baseline and stable Postop Assessment: no signs of nausea or vomiting Anesthetic complications: no     Last Vitals:  Vitals:   11/09/19 1210 11/09/19 1216  BP: (!) 111/54 (!) 120/48  Pulse: (!) 57 (!) 52  Resp: (!) 22 14  Temp: (!) 36.2 C 36.6 C  SpO2: 97% 93%    Last Pain:  Vitals:   11/09/19 1216  TempSrc: Temporal  PainSc: 0-No pain                 Alesandro Stueve

## 2019-11-09 NOTE — Transfer of Care (Signed)
Immediate Anesthesia Transfer of Care Note  Patient: Vernon [name]  Procedure(s) Performed: RIGHT SHOULDER ARTHROSCOPY WITH SUBSCAPULARIS REPAIR, SUBACROMIAL DECOMPRESSION,MINI OPEN ROTATOR CUFF REPAIR (Right )  Patient Location: PACU  Anesthesia Type:General  Level of Consciousness: awake, alert  and oriented  Airway & Oxygen Therapy: Patient Spontanous Breathing and Patient connected to face mask oxygen  Post-op Assessment: Report given to RN and Post -op Vital signs reviewed and stable  Post vital signs: Reviewed and stable  Last Vitals:  Vitals Value Taken Time  BP    Temp    Pulse 53 11/09/19 1054  Resp 24 11/09/19 1054  SpO2 96 % 11/09/19 1054  Vitals shown include unvalidated device data.  Last Pain:  Vitals:   11/09/19 0617  TempSrc: Tympanic  PainSc: 0-No pain         Complications: No apparent anesthesia complications

## 2019-11-09 NOTE — Anesthesia Procedure Notes (Signed)
Anesthesia Regional Block: Interscalene brachial plexus block   Pre-Anesthetic Checklist: ,, timeout performed, Correct Patient, Correct Site, Correct Laterality, Correct Procedure, Correct Position, site marked, Risks and benefits discussed,  Surgical consent,  Pre-op evaluation,  At surgeon's request and post-op pain management  Laterality: Right  Prep: chloraprep       Needles:  Injection technique: Single-shot  Needle Type: Stimiplex     Needle Length: 10cm  Needle Gauge: 21     Additional Needles:   Procedures:,,,, ultrasound used (permanent image in chart),,,,  Narrative:  Start time: 11/09/2019 7:27 AM End time: 11/09/2019 7:31 AM Injection made incrementally with aspirations every 5 mL.  Performed by: Personally  Anesthesiologist: Emmie Niemann, MD  Additional Notes: Functioning IV was confirmed and monitors were applied.  A Stimuplex needle was used. Sterile prep and drape,hand hygiene and sterile gloves were used.  Negative aspiration and negative test dose prior to incremental administration of local anesthetic. The patient tolerated the procedure well.

## 2019-11-09 NOTE — Discharge Instructions (Addendum)
Post-Op Instructions - Rotator Cuff Repair  1. Bracing: You will wear a shoulder immobilizer or sling for 6 weeks.   2. Driving: No driving for 3 weeks post-op. When driving, do not wear the immobilizer. Ideally, we recommend no driving for 6 weeks while sling is in place as one arm will be immobilized.   3. Activity: No active lifting for 2 months. Wrist, hand, and elbow motion only. Avoid lifting the upper arm away from the body except for hygiene. You are permitted to bend and straighten the elbow passively only (no active elbow motion). You may use your hand and wrist for typing, writing, and managing utensils (cutting food). Do not lift more than a coffee cup for 8 weeks.  When sleeping or resting, inclined positions (recliner chair or wedge pillow) and a pillow under the forearm for support may provide better comfort for up to 4 weeks.  Avoid long distance travel for 4 weeks.  Return to normal activities after rotator cuff repair repair normally takes 6 months on average. If rehab goes very well, may be able to do most activities at 4 months, except overhead or contact sports.  4. Physical Therapy: Begins 3-4 days after surgery, and proceed 1 time per week for the first 6 weeks, then 1-2 times per week from weeks 6-20 post-op.  5. Medications:  - You will be provided a prescription for narcotic pain medicine. After surgery, take 1-2 narcotic tablets every 4 hours if needed for severe pain.  - A prescription for anti-nausea medication will be provided in case the narcotic medicine causes nausea - take 1 tablet every 6 hours only if nauseated.   - Take tylenol 1000 mg (2 Extra Strength tablets or 3 regular strength) every 8 hours for pain.  May decrease or stop tylenol 5 days after surgery if you are having minimal pain. - Resume Eliquis the day after surgery.  - DO NOT take ANY nonsteroidal anti-inflammatory pain medications (Advil, Motrin, Ibuprofen, Aleve, Naproxen, or Naprosyn). These  medicines can inhibit healing of your shoulder repair.    If you are taking prescription medication for anxiety, depression, insomnia, muscle spasm, chronic pain, or for attention deficit disorder, you are advised that you are at a higher risk of adverse effects with use of narcotics post-op, including narcotic addiction/dependence, depressed breathing, death. If you use non-prescribed substances: alcohol, marijuana, cocaine, heroin, methamphetamines, etc., you are at a higher risk of adverse effects with use of narcotics post-op, including narcotic addiction/dependence, depressed breathing, death. You are advised that taking > 50 morphine milligram equivalents (MME) of narcotic pain medication per day results in twice the risk of overdose or death. For your prescription provided: oxycodone 5 mg - taking more than 6 tablets per day would result in > 50 morphine milligram equivalents (MME) of narcotic pain medication. Be advised that we will prescribe narcotics short-term, for acute post-operative pain only - 3 weeks for major operations such as shoulder repair/reconstruction surgeries.     6. Post-Op Appointment:  Your first post-op appointment will be 10-14 days post-op. Please call the office at [phone] if there is any question about appointments.  7. Work or School: For most, but not all procedures, we advise staying out of work or school for at least 1 to 2 weeks in order to recover from the stress of surgery and to allow time for healing.   If you need a work or school note this can be provided.   8. Smoking: If you are  a smoker, you need to refrain from smoking in the postoperative period. The nicotine in cigarettes will inhibit healing of your shoulder repair and decrease the chance of successful repair. Similarly, nicotine containing products (gum, patches) should be avoided.   Post-operative Brace: Apply and remove the brace you received as you were instructed to at the time of  fitting and as described in detail as the brace's instructions for use indicate.  Wear the brace for the period of time prescribed by your physician.  The brace can be cleaned with soap and water and allowed to air dry only.  Should the brace result in increased pain, decreased feeling (numbness/tingling), increased swelling or an overall worsening of your medical condition, please contact your doctor immediately.  If an emergency situation occurs as a result of wearing the brace after normal business hours, please dial 911 and seek immediate medical attention.  Let your doctor know if you have any further questions about the brace issued to you. Refer to the shoulder sling instructions for use if you have any questions regarding the correct fit of your shoulder sling.  [facility] [facility]: [phone]  Video that illustrates how to properly use a shoulder sling: "Instructions for Proper Use of an Orthopaedic Sling" [email or web address]   AMBULATORY SURGERY  DISCHARGE INSTRUCTIONS   1) The drugs that you were given will stay in your system until tomorrow so for the next 24 hours you should not:  A) Drive an automobile B) Make any legal decisions C) Drink any alcoholic beverage   2) You may resume regular meals tomorrow.  Today it is better to start with liquids and gradually work up to solid foods.  You may eat anything you prefer, but it is better to start with liquids, then soup and crackers, and gradually work up to solid foods.   3) Please notify your doctor immediately if you have any unusual bleeding, trouble breathing, redness and pain at the surgery site, drainage, fever, or pain not relieved by medication.    4) Additional Instructions:        Please contact your physician with any problems or Same Day Surgery at [phone], Monday through Friday 6 am to 4 pm, or [place] at [facility] number at [phone].

## 2019-11-09 NOTE — OR Nursing (Signed)
Updated Dr. Randa Lynn regarding pts o2 sats and hypotension.Sats have been stable on RA 92 to 98 % no longer displaying signs of dyspnea.  Blood pressure 103/52  Pt is awake and alert move from bed to chair without incident no signs of being orthostatic. Pt cleared to move to the post -op area.

## 2019-11-09 NOTE — H&P (Signed)
Paper H&P to be scanned into permanent record. H&P reviewed. No significant changes noted.  

## 2019-11-09 NOTE — Anesthesia Procedure Notes (Signed)
Procedure Name: Intubation Date/Time: 11/09/2019 7:53 AM Performed by: Bernardo Heater, CRNA Pre-anesthesia Checklist: Patient identified and Emergency Drugs available Patient Re-evaluated:Patient Re-evaluated prior to induction Oxygen Delivery Method: Circle system utilized Preoxygenation: Pre-oxygenation with 100% oxygen Induction Type: IV induction Laryngoscope Size: Mac and 3 Grade View: Grade I Tube type: Oral Tube size: 7.0 mm Number of attempts: 1 Placement Confirmation: ETT inserted through vocal cords under direct vision,  positive ETCO2 and breath sounds checked- equal and bilateral Secured at: 21 cm Tube secured with: Tape Dental Injury: Teeth and Oropharynx as per pre-operative assessment

## 2019-11-09 NOTE — Anesthesia Preprocedure Evaluation (Signed)
Anesthesia Evaluation  Patient identified by MRN, date of birth, ID band Patient awake    Reviewed: Allergy & Precautions, NPO status , Patient's Chart, lab work & pertinent test results  History of Anesthesia Complications Negative for: history of anesthetic complications  Airway Mallampati: II  TM Distance: >3 FB Neck ROM: Full    Dental  (+) Edentulous Upper, Poor Dentition, Missing   Pulmonary neg sleep apnea, neg COPD, former smoker,    breath sounds clear to auscultation- rhonchi (-) wheezing      Cardiovascular hypertension, Pt. on medications (-) CAD, (-) Past MI, (-) Cardiac Stents and (-) CABG + dysrhythmias Atrial Fibrillation  Rhythm:Regular Rate:Normal - Systolic murmurs and - Diastolic murmurs    Neuro/Psych neg Seizures negative neurological ROS  negative psych ROS   GI/Hepatic Neg liver ROS, hiatal hernia, GERD  ,  Endo/Other  diabetes, Type 2, Oral Hypoglycemic Agents  Renal/GU negative Renal ROS     Musculoskeletal  (+) Arthritis ,   Abdominal (+) + obese,   Peds  Hematology  (+) anemia ,   Anesthesia Other Findings Past Medical History: No date: Anemia No date: Arthritis No date: Complication of anesthesia     Comment:  hard time getting bp up after knee replacement No date: Diabetes mellitus without complication (HCC) No date: GERD (gastroesophageal reflux disease)     Comment:  occ tums prn No date: History of hiatal hernia No date: Hypertension   Reproductive/Obstetrics                             Anesthesia Physical Anesthesia Plan  ASA: III  Anesthesia Plan: General   Post-op Pain Management:  Regional for Post-op pain   Induction: Intravenous  PONV Risk Score and Plan: 1 and Ondansetron and Dexamethasone  Airway Management Planned: Oral ETT  Additional Equipment:   Intra-op Plan:   Post-operative Plan: Extubation in OR  Informed Consent: I  have reviewed the patients History and Physical, chart, labs and discussed the procedure including the risks, benefits and alternatives for the proposed anesthesia with the patient or authorized representative who has indicated his/her understanding and acceptance.     Dental advisory given  Plan Discussed with: CRNA and Anesthesiologist  Anesthesia Plan Comments:         Anesthesia Quick Evaluation

## 2020-01-12 ENCOUNTER — Other Ambulatory Visit: Payer: Self-pay

## 2020-01-13 ENCOUNTER — Inpatient Hospital Stay: Payer: Medicare Other | Attending: Internal Medicine | Admitting: Internal Medicine

## 2020-01-13 ENCOUNTER — Encounter (INDEPENDENT_AMBULATORY_CARE_PROVIDER_SITE_OTHER): Payer: Self-pay

## 2020-01-13 ENCOUNTER — Inpatient Hospital Stay: Payer: Medicare Other

## 2020-01-13 ENCOUNTER — Other Ambulatory Visit: Payer: Self-pay

## 2020-01-13 ENCOUNTER — Encounter: Payer: Self-pay | Admitting: Internal Medicine

## 2020-01-13 DIAGNOSIS — D539 Nutritional anemia, unspecified: Secondary | ICD-10-CM

## 2020-01-13 DIAGNOSIS — Z87891 Personal history of nicotine dependence: Secondary | ICD-10-CM

## 2020-01-13 DIAGNOSIS — Z7901 Long term (current) use of anticoagulants: Secondary | ICD-10-CM | POA: Insufficient documentation

## 2020-01-13 DIAGNOSIS — Z79899 Other long term (current) drug therapy: Secondary | ICD-10-CM | POA: Diagnosis not present

## 2020-01-13 DIAGNOSIS — E119 Type 2 diabetes mellitus without complications: Secondary | ICD-10-CM | POA: Diagnosis not present

## 2020-01-13 DIAGNOSIS — I4891 Unspecified atrial fibrillation: Secondary | ICD-10-CM | POA: Diagnosis not present

## 2020-01-13 DIAGNOSIS — I10 Essential (primary) hypertension: Secondary | ICD-10-CM

## 2020-01-13 DIAGNOSIS — R161 Splenomegaly, not elsewhere classified: Secondary | ICD-10-CM | POA: Insufficient documentation

## 2020-01-13 LAB — TECHNOLOGIST SMEAR REVIEW: Plt Morphology: ADEQUATE

## 2020-01-13 LAB — RETICULOCYTES
Immature Retic Fract: 14.2 % (ref 2.3–15.9)
RBC.: 2.55 MIL/uL — ABNORMAL LOW (ref 4.22–5.81)
Retic Count, Absolute: 30.1 10*3/uL (ref 19.0–186.0)
Retic Ct Pct: 1.2 % (ref 0.4–3.1)

## 2020-01-13 LAB — COMPREHENSIVE METABOLIC PANEL
ALT: 14 U/L (ref 0–44)
AST: 18 U/L (ref 15–41)
Albumin: 4.5 g/dL (ref 3.5–5.0)
Alkaline Phosphatase: 87 U/L (ref 38–126)
Anion gap: 9 (ref 5–15)
BUN: 29 mg/dL — ABNORMAL HIGH (ref 8–23)
CO2: 21 mmol/L — ABNORMAL LOW (ref 22–32)
Calcium: 9.5 mg/dL (ref 8.9–10.3)
Chloride: 110 mmol/L (ref 98–111)
Creatinine, Ser: 1.22 mg/dL (ref 0.61–1.24)
GFR calc Af Amer: >60 mL/min (ref >60)
GFR calc non Af Amer: 58 mL/min — ABNORMAL LOW (ref >60)
Glucose, Bld: 122 mg/dL — ABNORMAL HIGH (ref 70–99)
Potassium: 5 mmol/L (ref 3.5–5.1)
Sodium: 140 mmol/L (ref 135–145)
Total Bilirubin: 2.8 mg/dL — ABNORMAL HIGH (ref 0.3–1.2)
Total Protein: 7.5 g/dL (ref 6.5–8.1)

## 2020-01-13 LAB — CBC WITH DIFFERENTIAL/PLATELET
Abs Immature Granulocytes: 0.02 10*3/uL (ref 0.00–0.07)
Basophils Absolute: 0 10*3/uL (ref 0.0–0.1)
Basophils Relative: 0 %
Eosinophils Absolute: 0.1 10*3/uL (ref 0.0–0.5)
Eosinophils Relative: 1 %
HCT: 26.4 % — ABNORMAL LOW (ref 39.0–52.0)
Hemoglobin: 8.1 g/dL — ABNORMAL LOW (ref 13.0–17.0)
Immature Granulocytes: 0 %
Lymphocytes Relative: 20 %
Lymphs Abs: 1 10*3/uL (ref 0.7–4.0)
MCH: 31.9 pg (ref 26.0–34.0)
MCHC: 30.7 g/dL (ref 30.0–36.0)
MCV: 103.9 fL — ABNORMAL HIGH (ref 80.0–100.0)
Monocytes Absolute: 0.4 10*3/uL (ref 0.1–1.0)
Monocytes Relative: 9 %
Neutro Abs: 3.5 10*3/uL (ref 1.7–7.7)
Neutrophils Relative %: 70 %
Platelets: 245 10*3/uL (ref 150–400)
RBC: 2.54 MIL/uL — ABNORMAL LOW (ref 4.22–5.81)
RDW: 26.1 % — ABNORMAL HIGH (ref 11.5–15.5)
WBC: 5 10*3/uL (ref 4.0–10.5)
nRBC: 0.6 % — ABNORMAL HIGH (ref 0.0–0.2)

## 2020-01-13 LAB — DAT, POLYSPECIFIC AHG (ARMC ONLY): Polyspecific AHG test: NEGATIVE

## 2020-01-13 LAB — VITAMIN B12: Vitamin B-12: 1027 pg/mL — ABNORMAL HIGH (ref 180–914)

## 2020-01-13 LAB — LACTATE DEHYDROGENASE: LDH: 151 U/L (ref 98–192)

## 2020-01-13 LAB — FOLATE: Folate: 22 ng/mL (ref >5.9)

## 2020-01-13 NOTE — Assessment & Plan Note (Addendum)
#  Macrocytic anemia-chronic/slowly trending down most recent hemoglobin February 2021 8.3.  No evidence of iron deficiency.  Had a long discussion with the patient regarding multiple etiologies of anemia including but not limited ZD-G64 deficiency folic acid deficiency liver disease/hypersplenism; hemolysis and also MDS.  #Recommend checking CBC CMP LDH; haptoglobin Q03 folic acid; reticulocyte count review of peripheral smear; also abdomen ultrasound.  #Discussed with the patient that treatment options-we will place upon the etiology of his anemia.  We will hold off blood transfusion hemoglobin is 8.3/patient is asymptomatic.  Also discussed the possible need for bone marrow biopsy if above work-up is negative.  Thank you Dr.Johnston for allowing me to participate in the care of your pleasant patient. Please do not hesitate to contact me with questions or concerns in the interim.  # DISPOSITION: # Labs today # [place] asap # follow up in 2 weeks- MD; no labs- Dr.B

## 2020-01-13 NOTE — Progress Notes (Signed)
[place] [place] NOTE  Patient Care Team: Baxter Hire, MD as PCP - General (Internal Medicine)  CHIEF COMPLAINTS/PURPOSE OF CONSULTATION:    HEMATOLOGY HISTORY  # MACROCYTIC ANEMIA ~102- chronic; FEB 2021- IDA- Normal/PCP] EGD-none; colonoscopy-never [Cologurad x2- NEG]  # A.fib on eliquis.   HISTORY OF PRESENTING ILLNESS:  Vernon [name] 74 y.o.  male has been referred to [place] for further evaluation/work-up for anemia.  Patient noted to have fatigue many months ago-however since taking off metoprolol seems to have improved his fatigue  Denies any worsening shortness of breath or cough.  Chronic mild swelling in the legs.  No blood in stools no black or stools.  Never had a colonoscopy.  Blood in stools: none Change in bowel habits- None Blood in urine: None Difficulty swallowing: None Abnormal weight loss: None Iron supplementation:none Prior Blood transfusions: none   Review of Systems  Constitutional: Negative for chills, diaphoresis, fever, malaise/fatigue and weight loss.  HENT: Negative for nosebleeds and sore throat.   Eyes: Negative for double vision.  Respiratory: Negative for cough, hemoptysis, sputum production, shortness of breath and wheezing.   Cardiovascular: Negative for chest pain, palpitations, orthopnea and leg swelling.  Gastrointestinal: Negative for abdominal pain, blood in stool, constipation, diarrhea, heartburn, melena, nausea and vomiting.  Genitourinary: Negative for dysuria, frequency and urgency.  Musculoskeletal: Negative for back pain and joint pain.  Skin: Negative.  Negative for itching and rash.  Neurological: Negative for dizziness, tingling, focal weakness, weakness and headaches.  Endo/Heme/Allergies: Does not bruise/bleed easily.  Psychiatric/Behavioral: Negative for depression. The patient is not nervous/anxious and does not have insomnia.     MEDICAL HISTORY:  Past Medical History:  Diagnosis Date  . Anemia    . Arthritis   . Complication of anesthesia    hard time getting bp up after knee replacement  . Diabetes mellitus without complication ([place])   . GERD (gastroesophageal reflux disease)    occ tums prn  . History of hiatal hernia   . Hypertension     SURGICAL HISTORY: Past Surgical History:  Procedure Laterality Date  . CARPAL TUNNEL RELEASE  2012  . JOINT REPLACEMENT Right 2010  . SHOULDER ARTHROSCOPY WITH ROTATOR CUFF REPAIR AND OPEN BICEPS TENODESIS Right 11/09/2019   Procedure: RIGHT SHOULDER ARTHROSCOPY WITH SUBSCAPULARIS REPAIR, SUBACROMIAL DECOMPRESSION,MINI OPEN ROTATOR CUFF REPAIR;  Surgeon: Leim Fabry, MD;  Location: [facility] ORS;  Service: Orthopedics;  Laterality: Right;    SOCIAL HISTORY: Social History   Socioeconomic History  . Marital status: Married    Spouse name: Not on file  . Number of children: Not on file  . Years of education: Not on file  . Highest education level: Not on file  Occupational History  . Not on file  Tobacco Use  . Smoking status: Former Smoker    Packs/day: 0.50    Years: 8.00    Pack years: 4.00    Types: Cigarettes    Quit date: 07/21/1978    Years since quitting: 41.5  . Smokeless tobacco: Never Used  Substance and Sexual Activity  . Alcohol use: Yes    Alcohol/week: 0.0 - 1.0 standard drinks    Comment: rare beer  . Drug use: Never  . Sexual activity: Not on file  Other Topics Concern  . Not on file  Social History Narrative   Lives in [place]; with wife; quit smoking in early 75s; ocassional/ rare [may be 1 a month beer]. 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  Social Determinants of Health   Financial Resource Strain:   . Difficulty of Paying Living Expenses: Not on file  Food Insecurity:   . Worried About Charity fundraiser in the Last Year: Not on file  . Ran Out of Food in the Last Year: Not on file  Transportation Needs:   . Lack of Transportation (Medical): Not on file  . Lack of Transportation  (Non-Medical): Not on file  Physical Activity:   . Days of Exercise per Week: Not on file  . Minutes of Exercise per Session: Not on file  Stress:   . Feeling of Stress : Not on file  Social Connections:   . Frequency of Communication with Friends and Family: Not on file  . Frequency of Social Gatherings with Friends and Family: Not on file  . Attends Religious Services: Not on file  . Active Member of Clubs or Organizations: Not on file  . Attends Archivist Meetings: Not on file  . Marital Status: Not on file  Intimate Partner Violence:   . Fear of Current or Ex-Partner: Not on file  . Emotionally Abused: Not on file  . Physically Abused: Not on file  . Sexually Abused: Not on file    FAMILY HISTORY: Family History  Problem Relation Age of Onset  . Cancer Maternal Uncle     ALLERGIES:  has No Known Allergies.  MEDICATIONS:  Current Outpatient Medications  Medication Sig Dispense Refill  . acetaminophen (TYLENOL) 500 MG tablet Take 2 tablets (1,000 mg total) by mouth every 8 (eight) hours. 90 tablet 2  . allopurinol (ZYLOPRIM) 300 MG tablet Take 300 mg by mouth at bedtime.     Marland Kitchen apixaban (ELIQUIS) 5 MG TABS tablet Take 5 mg by mouth 2 (two) times daily.     . blood glucose meter kit and supplies Use 1 kit as needed.    . Cyanocobalamin (B-12) 3000 MCG CAPS Take 3,000 mcg by mouth daily.    Marland Kitchen lisinopril (ZESTRIL) 40 MG tablet Take 40 mg by mouth at bedtime.     . Multiple Vitamins-Minerals (CENTRUM SILVER PO) Take 1 tablet by mouth daily.    . simvastatin (ZOCOR) 80 MG tablet Take 40 mg by mouth daily at 6 PM.     . metFORMIN (GLUCOPHAGE) 500 MG tablet Take 500 mg by mouth 2 (two) times daily with a meal.      No current facility-administered medications for this visit.      PHYSICAL EXAMINATION:   Vitals:   01/13/20 1120  BP: (!) 158/76  Pulse: 80  Temp: (!) 97 F (36.1 C)   Filed Weights   01/13/20 1120  Weight: 226 lb (102.5 kg)    Physical  Exam  Constitutional: He is oriented to person, place, and time and well-developed, well-nourished, and in no distress.  HENT:  Head: Normocephalic and atraumatic.  Mouth/Throat: Oropharynx is clear and moist. No oropharyngeal exudate.  Eyes: Pupils are equal, round, and reactive to light.  Cardiovascular: Normal rate and regular rhythm.  Pulmonary/Chest: Effort normal and breath sounds normal. No respiratory distress. He has no wheezes.  Abdominal: Soft. Bowel sounds are normal. He exhibits no distension and no mass. There is no abdominal tenderness. There is no rebound and no guarding.  Musculoskeletal:        General: No tenderness or edema. Normal range of motion.     Cervical back: Normal range of motion and neck supple.  Neurological: He is alert and oriented  to person, place, and time.  Skin: Skin is warm.  Psychiatric: Affect normal.   LABORATORY DATA:  I have reviewed the data as listed Lab Results  Component Value Date   HGB 9.4 (L) 11/05/2019   No results for input(s): NA, K, CL, CO2, GLUCOSE, BUN, CREATININE, CALCIUM, GFRNONAA, GFRAA, PROT, ALBUMIN, AST, ALT, ALKPHOS, BILITOT, BILIDIR, IBILI in the last 8760 hours.   No results found.  Macrocytic anemia #Macrocytic anemia-chronic/slowly trending down most recent hemoglobin February 2021 8.3.  No evidence of iron deficiency.  Had a long discussion with the patient regarding multiple etiologies of anemia including but not limited WU-J81 deficiency folic acid deficiency liver disease/hypersplenism; hemolysis and also MDS.  #Recommend checking CBC CMP LDH; haptoglobin X91 folic acid; reticulocyte count review of peripheral smear; also abdomen ultrasound.  #Discussed with the patient that treatment options-we will place upon the etiology of his anemia.  We will hold off blood transfusion hemoglobin is 8.3/patient is asymptomatic.  Also discussed the possible need for bone marrow biopsy if above work-up is negative.  Thank you  Dr.Johnston for allowing me to participate in the care of your pleasant patient. Please do not hesitate to contact me with questions or concerns in the interim.  # DISPOSITION: # Labs today # [place] as[place] # follow up in 2 weeks- MD; no labs- Dr.B  All questions were answered. The patient knows to call the clinic with any problems, questions or concerns.    Cammie Sickle, MD 01/13/2020 12:40 PM

## 2020-01-14 LAB — HAPTOGLOBIN: Haptoglobin: 88 mg/dL (ref 34–355)

## 2020-01-15 ENCOUNTER — Ambulatory Visit: Payer: Medicare Other | Attending: Internal Medicine

## 2020-01-15 ENCOUNTER — Telehealth: Payer: Self-pay | Admitting: *Deleted

## 2020-01-15 NOTE — Telephone Encounter (Signed)
Pt returned phone call. He has PT on every Wed and Fridays. He said that he was unable to do this u/s on Friday  He would appreciate a phone call back to r/s his u/s either on a Monday Tuesday or Thursday.

## 2020-01-15 NOTE — Telephone Encounter (Signed)
Kim form [place] called reporting that patient did not show up for his appointment this morning.

## 2020-01-15 NOTE — Telephone Encounter (Signed)
Colette - contact patient to determine when he is able to r/s this u/s

## 2020-01-19 ENCOUNTER — Ambulatory Visit
Admission: RE | Admit: 2020-01-19 | Discharge: 2020-01-19 | Disposition: A | Discharge status: Home / Self Care | Payer: Medicare Other | Source: Ambulatory Visit | Attending: Internal Medicine | Admitting: Internal Medicine

## 2020-01-19 ENCOUNTER — Other Ambulatory Visit: Payer: Self-pay

## 2020-01-19 DIAGNOSIS — R161 Splenomegaly, not elsewhere classified: Secondary | ICD-10-CM

## 2020-01-19 DIAGNOSIS — D539 Nutritional anemia, unspecified: Secondary | ICD-10-CM | POA: Insufficient documentation

## 2020-01-27 ENCOUNTER — Encounter: Payer: Self-pay | Admitting: Internal Medicine

## 2020-01-27 ENCOUNTER — Other Ambulatory Visit: Payer: Self-pay

## 2020-01-27 ENCOUNTER — Inpatient Hospital Stay: Payer: Medicare Other | Attending: Internal Medicine | Admitting: Internal Medicine

## 2020-01-27 DIAGNOSIS — Z79899 Other long term (current) drug therapy: Secondary | ICD-10-CM | POA: Insufficient documentation

## 2020-01-27 DIAGNOSIS — E119 Type 2 diabetes mellitus without complications: Secondary | ICD-10-CM | POA: Diagnosis not present

## 2020-01-27 DIAGNOSIS — N183 Chronic kidney disease, stage 3 unspecified: Secondary | ICD-10-CM | POA: Insufficient documentation

## 2020-01-27 DIAGNOSIS — D539 Nutritional anemia, unspecified: Secondary | ICD-10-CM

## 2020-01-27 DIAGNOSIS — Z7901 Long term (current) use of anticoagulants: Secondary | ICD-10-CM | POA: Diagnosis not present

## 2020-01-27 DIAGNOSIS — Z87891 Personal history of nicotine dependence: Secondary | ICD-10-CM | POA: Insufficient documentation

## 2020-01-27 DIAGNOSIS — D469 Myelodysplastic syndrome, unspecified: Secondary | ICD-10-CM | POA: Diagnosis not present

## 2020-01-27 DIAGNOSIS — Z7984 Long term (current) use of oral hypoglycemic drugs: Secondary | ICD-10-CM | POA: Diagnosis not present

## 2020-01-27 DIAGNOSIS — I10 Essential (primary) hypertension: Secondary | ICD-10-CM | POA: Diagnosis not present

## 2020-01-27 DIAGNOSIS — R5383 Other fatigue: Secondary | ICD-10-CM | POA: Diagnosis not present

## 2020-01-27 NOTE — Progress Notes (Signed)
[place] NOTE  Patient Care Team: Baxter Hire, MD as PCP - General (Internal Medicine)  CHIEF COMPLAINTS/PURPOSE OF CONSULTATION: Macrocytic anemia  HEMATOLOGY HISTORY  # MACROCYTIC ANEMIA ~102- chronic; FEB 2021- IDA- Normal/PCP] EGD-none; colonoscopy-never [Cologurad x2- NEG];FEB 2021- US- abdomen-no liver disease/ Mild splenic enlargement [460cc; kidney cysts]; G64 folic acid/LDH haptoglobin normal.   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   HISTORY OF PRESENTING ILLNESS:  Vernon [name] 74 y.o.  male with history of macrocytic anemia is here for follow-up.  Patient continues to have mild fatigue.  However his fatigue is improved since stopping his metoprolol.  Denies any worsening shortness of breath or cough.  Chronic mild swelling in the legs.   Review of Systems  Constitutional: Positive for malaise/fatigue. Negative for chills, diaphoresis, fever and weight loss.  HENT: Negative for nosebleeds and sore throat.   Eyes: Negative for double vision.  Respiratory: Negative for cough, hemoptysis, sputum production, shortness of breath and wheezing.   Cardiovascular: Negative for chest pain, palpitations, orthopnea and leg swelling.  Gastrointestinal: Negative for abdominal pain, blood in stool, constipation, diarrhea, heartburn, melena, nausea and vomiting.  Genitourinary: Negative for dysuria, frequency and urgency.  Musculoskeletal: Negative for back pain and joint pain.  Skin: Negative.  Negative for itching and rash.  Neurological: Negative for dizziness, tingling, focal weakness, weakness and headaches.  Endo/Heme/Allergies: Does not bruise/bleed easily.  Psychiatric/Behavioral: Negative for depression. The patient is not nervous/anxious and does not have insomnia.     MEDICAL HISTORY:  Past Medical History:  Diagnosis Date  . Anemia   . Arthritis   . Complication of anesthesia    hard time getting bp up after knee replacement  .  Diabetes mellitus without complication ([place])   . GERD (gastroesophageal reflux disease)    occ tums prn  . History of hiatal hernia   . Hypertension     SURGICAL HISTORY: Past Surgical History:  Procedure Laterality Date  . CARPAL TUNNEL RELEASE  2012  . JOINT REPLACEMENT Right 2010  . SHOULDER ARTHROSCOPY WITH ROTATOR CUFF REPAIR AND OPEN BICEPS TENODESIS Right 11/09/2019   Procedure: RIGHT SHOULDER ARTHROSCOPY WITH SUBSCAPULARIS REPAIR, SUBACROMIAL DECOMPRESSION,MINI OPEN ROTATOR CUFF REPAIR;  Surgeon: Leim Fabry, MD;  Location: [facility] ORS;  Service: Orthopedics;  Laterality: Right;    SOCIAL HISTORY: Social History   Socioeconomic History  . Marital status: Married    Spouse name: Not on file  . Number of children: Not on file  . Years of education: Not on file  . Highest education level: Not on file  Occupational History  . Not on file  Tobacco Use  . Smoking status: Former Smoker    Packs/day: 0.50    Years: 8.00    Pack years: 4.00    Types: Cigarettes    Quit date: 07/21/1978    Years since quitting: 41.5  . Smokeless tobacco: Never Used  Substance and Sexual Activity  . Alcohol use: Yes    Alcohol/week: 0.0 - 1.0 standard drinks    Comment: rare beer  . Drug use: Never  . Sexual activity: Not on file  Other Topics Concern  . Not on file  Social History Narrative   Lives in [place]; with wife; quit smoking in early 28s; ocassional/ rare [may be 1 a month beer]. 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    Social Determinants of Health   Financial Resource Strain:   . Difficulty of Paying Living  Expenses: Not on file  Food Insecurity:   . Worried About Charity fundraiser in the Last Year: Not on file  . Ran Out of Food in the Last Year: Not on file  Transportation Needs:   . Lack of Transportation (Medical): Not on file  . Lack of Transportation (Non-Medical): Not on file  Physical Activity:   . Days of Exercise per Week: Not on file  . Minutes of  Exercise per Session: Not on file  Stress:   . Feeling of Stress : Not on file  Social Connections:   . Frequency of Communication with Friends and Family: Not on file  . Frequency of Social Gatherings with Friends and Family: Not on file  . Attends Religious Services: Not on file  . Active Member of Clubs or Organizations: Not on file  . Attends Archivist Meetings: Not on file  . Marital Status: Not on file  Intimate Partner Violence:   . Fear of Current or Ex-Partner: Not on file  . Emotionally Abused: Not on file  . Physically Abused: Not on file  . Sexually Abused: Not on file    FAMILY HISTORY: Family History  Problem Relation Age of Onset  . Cancer Maternal Uncle     ALLERGIES:  has No Known Allergies.  MEDICATIONS:  Current Outpatient Medications  Medication Sig Dispense Refill  . acetaminophen (TYLENOL) 500 MG tablet Take 2 tablets (1,000 mg total) by mouth every 8 (eight) hours. 90 tablet 2  . allopurinol (ZYLOPRIM) 300 MG tablet Take 300 mg by mouth at bedtime.     Marland Kitchen apixaban (ELIQUIS) 5 MG TABS tablet Take 5 mg by mouth 2 (two) times daily.     . blood glucose meter kit and supplies Use 1 kit as needed.    . Cyanocobalamin (B-12) 3000 MCG CAPS Take 3,000 mcg by mouth daily.    Marland Kitchen lisinopril (ZESTRIL) 40 MG tablet Take 40 mg by mouth at bedtime.     . Multiple Vitamins-Minerals (CENTRUM SILVER PO) Take 1 tablet by mouth daily.    . simvastatin (ZOCOR) 80 MG tablet Take 40 mg by mouth daily at 6 PM.     . metFORMIN (GLUCOPHAGE) 500 MG tablet Take 500 mg by mouth 2 (two) times daily with a meal.      No current facility-administered medications for this visit.      PHYSICAL EXAMINATION:   Vitals:   01/27/20 1022  BP: (!) 137/57  Pulse: (!) 54  Temp: 97.7 F (36.5 C)   Filed Weights   01/27/20 1022  Weight: 226 lb (102.5 kg)    Physical Exam  Constitutional: He is oriented to person, place, and time and well-developed, well-nourished, and in  no distress.  HENT:  Head: Normocephalic and atraumatic.  Mouth/Throat: Oropharynx is clear and moist. No oropharyngeal exudate.  Eyes: Pupils are equal, round, and reactive to light.  Cardiovascular: Normal rate and regular rhythm.  Pulmonary/Chest: Effort normal and breath sounds normal. No respiratory distress. He has no wheezes.  Abdominal: Soft. Bowel sounds are normal. He exhibits no distension and no mass. There is no abdominal tenderness. There is no rebound and no guarding.  Musculoskeletal:        General: No tenderness or edema. Normal range of motion.     Cervical back: Normal range of motion and neck supple.  Neurological: He is alert and oriented to person, place, and time.  Skin: Skin is warm.  Psychiatric: Affect normal.  LABORATORY DATA:  I have reviewed the data as listed Lab Results  Component Value Date   WBC 5.0 01/13/2020   HGB 8.1 (L) 01/13/2020   HCT 26.4 (L) 01/13/2020   MCV 103.9 (H) 01/13/2020   PLT 245 01/13/2020   Recent Labs    01/13/20 1250  NA 140  K 5.0  CL 110  CO2 21*  GLUCOSE 122*  BUN 29*  CREATININE 1.22  CALCIUM 9.5  GFRNONAA 58*  GFRAA >60  PROT 7.5  ALBUMIN 4.5  AST 18  ALT 14  ALKPHOS 87  BILITOT 2.8*     US Abdomen Complete  Result Date: 01/19/2020 CLINICAL DATA:  Macrocytic anemia, splenic enlargement EXAM: ABDOMEN ULTRASOUND COMPLETE COMPARISON:  None FINDINGS: Gallbladder: Normally distended without stones or wall thickening. No pericholecystic fluid or sonographic Murphy sign. Common bile duct: Diameter: 4 mm, normal Liver: Normal echogenicity without mass. No intrahepatic biliary dilatation. Portal vein is patent on color Doppler imaging with normal direction of blood flow towards the liver. IVC: Normal appearance Pancreas: Normal appearance Spleen: No focal abnormalities. 12.3 x 12.3 x 5.8 cm (volume = 460 cm3). Right Kidney: Length: 11.2 cm. Cortical thinning. Increased cortical echogenicity. Two adjacent cysts at  inferior pole with a thin intervening septation, measuring 6.7 x 6.2 x 5.0 cm and 6.5 x 6.6 x 7.4 cm. Smaller cyst at upper pole. No hydronephrosis. Left Kidney: Length: 11.8 cm. Cortical thinning. Increased cortical echogenicity. Cyst at inferior pole LEFT kidney 6.7 x 5.4 x 6.4 cm. Additional smaller cyst at upper pole 4.1 x 3.8 x 4.0 cm. No hydronephrosis. Abdominal aorta: Normal caliber Other findings: Small amount of ascites. IMPRESSION: Large BILATERAL renal cysts. Mild splenic enlargement. Medical renal disease changes of both kidneys. Electronically Signed   By: Lavonia Dana M.D.   On: 01/19/2020 16:53    Macrocytic anemia #Macrocytic anemia-chronic/slowly trending down most recent hemoglobin February 2021 8.3.  No obvious etiology noted based upon most recent blood work to explain his ongoing macrocytic anemia.  Ultrasound showed mild splenomegaly which would not explain the severe macrocytic anemia.  Reviewed the work-up blood work with the patient in detail.  #Again reviewed with the patient the need for bone marrow biopsy given the absence of any clear explanation for his ongoing macrocytic anemia.  I discussed my significant concerns regarding low-grade MDS as a possibility.  Also discussed that if this is truly low-grade MDS-patient might benefit from erythropoietin stimulating injections.  # DISPOSITION: # bone marrow in 2 weeks # follow up in 3 weeks- MD; labs- cbc/hold tube/ epo level- dr.B  All questions were answered. The patient knows to call the clinic with any problems, questions or concerns.    Cammie Sickle, MD 02/02/2020 7:24 AM

## 2020-01-27 NOTE — Assessment & Plan Note (Addendum)
#  Macrocytic anemia-chronic/slowly trending down most recent hemoglobin February 2021 8.3.  No obvious etiology noted based upon most recent blood work to explain his ongoing macrocytic anemia.  Ultrasound showed mild splenomegaly which would not explain the severe macrocytic anemia.  Reviewed the work-up blood work with the patient in detail.  #Again reviewed with the patient the need for bone marrow biopsy given the absence of any clear explanation for his ongoing macrocytic anemia.  I discussed my significant concerns regarding low-grade MDS as a possibility.  Also discussed that if this is truly low-grade MDS-patient might benefit from erythropoietin stimulating injections.  # DISPOSITION: # bone marrow in 2 weeks # follow up in 3 weeks- MD; labs- cbc/hold tube/ epo level- dr.B

## 2020-02-02 ENCOUNTER — Other Ambulatory Visit: Payer: Self-pay | Admitting: Radiology

## 2020-02-03 ENCOUNTER — Ambulatory Visit
Admission: RE | Admit: 2020-02-03 | Discharge: 2020-02-03 | Disposition: A | Discharge status: Home / Self Care | Payer: Medicare Other | Source: Ambulatory Visit | Attending: Internal Medicine | Admitting: Internal Medicine

## 2020-02-03 ENCOUNTER — Other Ambulatory Visit: Payer: Self-pay

## 2020-02-03 DIAGNOSIS — I10 Essential (primary) hypertension: Secondary | ICD-10-CM | POA: Diagnosis not present

## 2020-02-03 DIAGNOSIS — D539 Nutritional anemia, unspecified: Secondary | ICD-10-CM | POA: Diagnosis present

## 2020-02-03 DIAGNOSIS — E119 Type 2 diabetes mellitus without complications: Secondary | ICD-10-CM | POA: Diagnosis not present

## 2020-02-03 DIAGNOSIS — Z87891 Personal history of nicotine dependence: Secondary | ICD-10-CM | POA: Diagnosis not present

## 2020-02-03 DIAGNOSIS — Z96659 Presence of unspecified artificial knee joint: Secondary | ICD-10-CM | POA: Insufficient documentation

## 2020-02-03 DIAGNOSIS — Z7984 Long term (current) use of oral hypoglycemic drugs: Secondary | ICD-10-CM | POA: Diagnosis not present

## 2020-02-03 DIAGNOSIS — K219 Gastro-esophageal reflux disease without esophagitis: Secondary | ICD-10-CM | POA: Insufficient documentation

## 2020-02-03 DIAGNOSIS — M1990 Unspecified osteoarthritis, unspecified site: Secondary | ICD-10-CM | POA: Insufficient documentation

## 2020-02-03 DIAGNOSIS — D469 Myelodysplastic syndrome, unspecified: Secondary | ICD-10-CM | POA: Diagnosis not present

## 2020-02-03 DIAGNOSIS — Z7901 Long term (current) use of anticoagulants: Secondary | ICD-10-CM | POA: Diagnosis not present

## 2020-02-03 DIAGNOSIS — Z79899 Other long term (current) drug therapy: Secondary | ICD-10-CM | POA: Diagnosis not present

## 2020-02-03 LAB — CBC WITH DIFFERENTIAL/PLATELET
Abs Immature Granulocytes: 0.05 10*3/uL (ref 0.00–0.07)
Basophils Absolute: 0.1 10*3/uL (ref 0.0–0.1)
Basophils Relative: 1 %
Eosinophils Absolute: 0.2 10*3/uL (ref 0.0–0.5)
Eosinophils Relative: 3 %
HCT: 26.8 % — ABNORMAL LOW (ref 39.0–52.0)
Hemoglobin: 8.5 g/dL — ABNORMAL LOW (ref 13.0–17.0)
Immature Granulocytes: 1 %
Lymphocytes Relative: 21 %
Lymphs Abs: 1.4 10*3/uL (ref 0.7–4.0)
MCH: 31.8 pg (ref 26.0–34.0)
MCHC: 31.7 g/dL (ref 30.0–36.0)
MCV: 100.4 fL — ABNORMAL HIGH (ref 80.0–100.0)
Monocytes Absolute: 0.5 10*3/uL (ref 0.1–1.0)
Monocytes Relative: 7 %
Neutro Abs: 4.3 10*3/uL (ref 1.7–7.7)
Neutrophils Relative %: 67 %
Platelets: 245 10*3/uL (ref 150–400)
RBC: 2.67 MIL/uL — ABNORMAL LOW (ref 4.22–5.81)
RDW: 26.3 % — ABNORMAL HIGH (ref 11.5–15.5)
WBC: 6.4 10*3/uL (ref 4.0–10.5)
nRBC: 0.6 % — ABNORMAL HIGH (ref 0.0–0.2)

## 2020-02-03 LAB — GLUCOSE, CAPILLARY: Glucose-Capillary: 132 mg/dL — ABNORMAL HIGH (ref 70–99)

## 2020-02-03 MED ORDER — HEPARIN SOD (PORK) LOCK FLUSH 100 UNIT/ML IV SOLN
INTRAVENOUS | Status: AC
  Filled 2020-02-03: qty 5

## 2020-02-03 MED ORDER — FENTANYL CITRATE (PF) 100 MCG/2ML IJ SOLN
INTRAMUSCULAR | Status: AC | PRN
  Administered 2020-02-03 (×2): 50 ug via INTRAVENOUS

## 2020-02-03 MED ORDER — SODIUM CHLORIDE 0.9 % IV SOLN: INTRAVENOUS | Status: DC

## 2020-02-03 MED ORDER — MIDAZOLAM HCL 2 MG/2ML IJ SOLN
INTRAMUSCULAR | Status: AC | PRN
  Administered 2020-02-03 (×2): 1 mg via INTRAVENOUS

## 2020-02-03 MED ORDER — FENTANYL CITRATE (PF) 100 MCG/2ML IJ SOLN
INTRAMUSCULAR | Status: AC
  Filled 2020-02-03: qty 2

## 2020-02-03 MED ORDER — MIDAZOLAM HCL 2 MG/2ML IJ SOLN
INTRAMUSCULAR | Status: AC
  Filled 2020-02-03: qty 2

## 2020-02-03 NOTE — Discharge Instructions (Signed)
Bone Marrow Aspiration and Bone Marrow Biopsy, Adult, Care After This sheet gives you information about how to care for yourself after your procedure. Your health care provider may also give you more specific instructions. If you have problems or questions, contact your health care provider. What can I expect after the procedure? After the procedure, it is common to have:  Mild pain and tenderness.  Swelling.  Bruising. Follow these instructions at home: Puncture site care   Follow instructions from your health care provider about how to take care of the puncture site. Make sure you: ? Wash your hands with soap and water before and after you change your bandage (dressing). If soap and water are not available, use hand sanitizer. ? Change your dressing as told by your health care provider.  Check your puncture site every day for signs of infection. Check for: ? More redness, swelling, or pain. ? Fluid or blood. ? Warmth. ? Pus or a bad smell. Activity  Return to your normal activities as told by your health care provider. Ask your health care provider what activities are safe for you.  Do not lift anything that is heavier than 10 lb (4.5 kg), or the limit that you are told, until your health care provider says that it is safe.  Do not drive for 24 hours if you were given a sedative during your procedure. General instructions   Take over-the-counter and prescription medicines only as told by your health care provider.  Do not take baths, swim, or use a hot tub until your health care provider approves. Ask your health care provider if you may take showers. You may only be allowed to take sponge baths.  If directed, put ice on the affected area. To do this: ? Put ice in a plastic bag. ? Place a towel between your skin and the bag. ? Leave the ice on for 20 minutes, 2-3 times a day.  Keep all follow-up visits as told by your health care provider. This is important. Contact a  health care provider if:  Your pain is not controlled with medicine.  You have a fever.  You have more redness, swelling, or pain around the puncture site.  You have fluid or blood coming from the puncture site.  Your puncture site feels warm to the touch.  You have pus or a bad smell coming from the puncture site. Summary  After the procedure, it is common to have mild pain, tenderness, swelling, and bruising.  Follow instructions from your health care provider about how to take care of the puncture site and what activities are safe for you.  Take over-the-counter and prescription medicines only as told by your health care provider.  Contact a health care provider if you have any signs of infection, such as fluid or blood coming from the puncture site. This information is not intended to replace advice given to you by your health care provider. Make sure you discuss any questions you have with your health care provider. Document Revised: 03/31/2019 Document Reviewed: 03/31/2019 Elsevier Patient Education  2020 Elsevier Inc.  

## 2020-02-03 NOTE — Procedures (Signed)
Interventional Radiology Procedure Note  Procedure: CT guided aspirate and core biopsy of right posterior iliac bone Complications: None Recommendations: - Bedrest supine x 1 hrs - OTC's PRN  Pain - Follow biopsy results  Signed,  Tiffney Haughton S. Catherine Oak, DO    

## 2020-02-03 NOTE — H&P (Signed)
Chief Complaint: Macrocytic anemia  Referring Physician(s): Cammie Sickle  Supervising Physician: Corrie Mckusick  Patient Status: [facility] - Out-pt  History of Present Illness: Vernon [name] is a 74 y.o. male with history of macrocytic anemia, presenting for bone marrow biopsy.   He denies any new symptoms.     Past Medical History:  Diagnosis Date  . Anemia   . Arthritis   . Complication of anesthesia    hard time getting bp up after knee replacement  . Diabetes mellitus without complication ([place])   . GERD (gastroesophageal reflux disease)    occ tums prn  . History of hiatal hernia   . Hypertension     Past Surgical History:  Procedure Laterality Date  . CARPAL TUNNEL RELEASE  2012  . JOINT REPLACEMENT Right 2010  . SHOULDER ARTHROSCOPY WITH ROTATOR CUFF REPAIR AND OPEN BICEPS TENODESIS Right 11/09/2019   Procedure: RIGHT SHOULDER ARTHROSCOPY WITH SUBSCAPULARIS REPAIR, SUBACROMIAL DECOMPRESSION,MINI OPEN ROTATOR CUFF REPAIR;  Surgeon: Leim Fabry, MD;  Location: [facility] ORS;  Service: Orthopedics;  Laterality: Right;    Allergies: Patient has no known allergies.  Medications: Prior to Admission medications   Medication Sig Start Date End Date Taking? Authorizing Provider  allopurinol (ZYLOPRIM) 300 MG tablet Take 300 mg by mouth at bedtime.    Yes [provider]  apixaban (ELIQUIS) 5 MG TABS tablet Take 5 mg by mouth 2 (two) times daily.  02/06/17  Yes [provider]  Cyanocobalamin (B-12) 3000 MCG CAPS Take 3,000 mcg by mouth daily.   Yes [provider]  metFORMIN (GLUCOPHAGE) 500 MG tablet Take 500 mg by mouth 2 (two) times daily with a meal.  09/19/18 02/03/20 Yes [provider]  Multiple Vitamins-Minerals (CENTRUM SILVER PO) Take 1 tablet by mouth daily.   Yes [provider]  acetaminophen (TYLENOL) 500 MG tablet Take 2 tablets (1,000 mg total) by mouth every 8 (eight) hours. 11/09/19 11/08/20  Leim Fabry,  MD  blood glucose meter kit and supplies Use 1 kit as needed. 08/16/17   [provider]  lisinopril (ZESTRIL) 40 MG tablet Take 40 mg by mouth at bedtime.     [provider]  simvastatin (ZOCOR) 80 MG tablet Take 40 mg by mouth daily at 6 PM.     [provider]     Family History  Problem Relation Age of Onset  . Cancer Maternal Uncle     Social History   Socioeconomic History  . Marital status: Married    Spouse name: Not on file  . Number of children: Not on file  . Years of education: Not on file  . Highest education level: Not on file  Occupational History  . Not on file  Tobacco Use  . Smoking status: Former Smoker    Packs/day: 0.50    Years: 8.00    Pack years: 4.00    Types: Cigarettes    Quit date: 07/21/1978    Years since quitting: 41.5  . Smokeless tobacco: Never Used  Substance and Sexual Activity  . Alcohol use: Yes    Alcohol/week: 0.0 - 1.0 standard drinks    Comment: rare beer  . Drug use: Never  . Sexual activity: Not on file  Other Topics Concern  . Not on file  Social History Narrative   Lives in [place]; with wife; quit smoking in early 65s; ocassional/ rare [may be 1 a month beer]. 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    Social Determinants  of Health   Financial Resource Strain:   . Difficulty of Paying Living Expenses: Not on file  Food Insecurity:   . Worried About Charity fundraiser in the Last Year: Not on file  . Ran Out of Food in the Last Year: Not on file  Transportation Needs:   . Lack of Transportation (Medical): Not on file  . Lack of Transportation (Non-Medical): Not on file  Physical Activity:   . Days of Exercise per Week: Not on file  . Minutes of Exercise per Session: Not on file  Stress:   . Feeling of Stress : Not on file  Social Connections:   . Frequency of Communication with Friends and Family: Not on file  . Frequency of Social Gatherings with Friends and Family: Not on file  .  Attends Religious Services: Not on file  . Active Member of Clubs or Organizations: Not on file  . Attends Archivist Meetings: Not on file  . Marital Status: Not on file      Review of Systems: A 12 point ROS discussed and pertinent positives are indicated in the HPI above.  All other systems are negative.  Review of Systems  Vital Signs: BP (!) 158/67   Pulse 70   Temp 98 F (36.7 C) (Oral)   Ht '5\' 9"'$  (1.753 m)   Wt 102.5 kg   SpO2 98%   BMI 33.37 kg/m   Physical Exam General: 74 yo male appearing stated age.  Well-developed, well-nourished.  No distress. HEENT: Atraumatic, normocephalic.  Conjugate gaze, extra-ocular motor intact. No scleral icterus or scleral injection. No lesions on external ears, nose, lips, or gums.  Oral mucosa moist, pink.  Neck: Symmetric with no goiter enlargement.  Chest/Lungs:  Symmetric chest with inspiration/expiration.  No labored breathing.  Clear to auscultation with no wheezes, rhonchi, or rales.  Heart:  RRR, with mild systolic murmur appreciated. No JVD appreciated.  Abdomen:  Soft, NT/ND, with + bowel sounds.   Genito-urinary: Deferred Neurologic: Alert & Oriented to person, place, and time.   Normal affect and insight.  Appropriate questions.  Moving all 4 extremities with gross sensory intact.     Imaging: US Abdomen Complete  Result Date: 01/19/2020 CLINICAL DATA:  Macrocytic anemia, splenic enlargement EXAM: ABDOMEN ULTRASOUND COMPLETE COMPARISON:  None FINDINGS: Gallbladder: Normally distended without stones or wall thickening. No pericholecystic fluid or sonographic Murphy sign. Common bile duct: Diameter: 4 mm, normal Liver: Normal echogenicity without mass. No intrahepatic biliary dilatation. Portal vein is patent on color Doppler imaging with normal direction of blood flow towards the liver. IVC: Normal appearance Pancreas: Normal appearance Spleen: No focal abnormalities. 12.3 x 12.3 x 5.8 cm (volume = 460 cm3). Right  Kidney: Length: 11.2 cm. Cortical thinning. Increased cortical echogenicity. Two adjacent cysts at inferior pole with a thin intervening septation, measuring 6.7 x 6.2 x 5.0 cm and 6.5 x 6.6 x 7.4 cm. Smaller cyst at upper pole. No hydronephrosis. Left Kidney: Length: 11.8 cm. Cortical thinning. Increased cortical echogenicity. Cyst at inferior pole LEFT kidney 6.7 x 5.4 x 6.4 cm. Additional smaller cyst at upper pole 4.1 x 3.8 x 4.0 cm. No hydronephrosis. Abdominal aorta: Normal caliber Other findings: Small amount of ascites. IMPRESSION: Large BILATERAL renal cysts. Mild splenic enlargement. Medical renal disease changes of both kidneys. Electronically Signed   By: Lavonia Dana M.D.   On: 01/19/2020 16:53    Labs:  CBC: Recent Labs    11/05/19 1118 01/13/20 1250 02/03/20  0740  WBC  --  5.0 6.4  HGB 9.4* 8.1* 8.5*  HCT  --  26.4* 26.8*  PLT  --  245 245    COAGS: No results for input(s): INR, APTT in the last 8760 hours.  BMP: Recent Labs    01/13/20 1250  NA 140  K 5.0  CL 110  CO2 21*  GLUCOSE 122*  BUN 29*  CALCIUM 9.5  CREATININE 1.22  GFRNONAA 58*  GFRAA >60    LIVER FUNCTION TESTS: Recent Labs    01/13/20 1250  BILITOT 2.8*  AST 18  ALT 14  ALKPHOS 87  PROT 7.5  ALBUMIN 4.5    TUMOR MARKERS: No results for input(s): AFPTM, CEA, CA199, CHROMGRNA in the last 8760 hours.  Assessment and Plan:  Vernon [name] is 74 yo male with macrocytic anemia, presenting for bone marrow biopsy.   Risks and benefits of bone marrow biopsy was discussed with the patient and/or patient's family including, but not limited to bleeding, infection, damage to adjacent structures or low yield requiring additional tests.  All of the questions were answered and there is agreement to proceed.  Consent signed and in chart.    Thank you for this interesting consult.  I greatly enjoyed meeting KAYLIN SCHELLENBERG and look forward to participating in their care.  A copy of this report was sent  to the requesting provider on this date.  Electronically Signed: Corrie Mckusick, DO 02/03/2020, 8:11 AM   I spent a total of  15 Minutes   in face to face in clinical consultation, greater than 50% of which was counseling/coordinating care for bone marrow biopsy

## 2020-02-04 LAB — SURGICAL PATHOLOGY

## 2020-02-11 ENCOUNTER — Encounter (HOSPITAL_COMMUNITY): Payer: Self-pay | Admitting: Internal Medicine

## 2020-02-17 ENCOUNTER — Ambulatory Visit: Payer: Medicare Other | Admitting: Internal Medicine

## 2020-02-17 ENCOUNTER — Other Ambulatory Visit: Payer: Medicare Other

## 2020-02-18 ENCOUNTER — Encounter: Payer: Self-pay | Admitting: Internal Medicine

## 2020-02-18 ENCOUNTER — Inpatient Hospital Stay: Payer: Medicare Other

## 2020-02-18 ENCOUNTER — Other Ambulatory Visit: Payer: Self-pay

## 2020-02-18 ENCOUNTER — Inpatient Hospital Stay (HOSPITAL_BASED_OUTPATIENT_CLINIC_OR_DEPARTMENT_OTHER): Payer: Medicare Other | Admitting: Internal Medicine

## 2020-02-18 DIAGNOSIS — D4620 Refractory anemia with excess of blasts, unspecified: Secondary | ICD-10-CM | POA: Insufficient documentation

## 2020-02-18 DIAGNOSIS — D469 Myelodysplastic syndrome, unspecified: Secondary | ICD-10-CM | POA: Insufficient documentation

## 2020-02-18 DIAGNOSIS — D539 Nutritional anemia, unspecified: Secondary | ICD-10-CM

## 2020-02-18 LAB — CBC WITH DIFFERENTIAL/PLATELET
Abs Immature Granulocytes: 0.04 10*3/uL (ref 0.00–0.07)
Basophils Absolute: 0 10*3/uL (ref 0.0–0.1)
Basophils Relative: 1 %
Eosinophils Absolute: 0.1 10*3/uL (ref 0.0–0.5)
Eosinophils Relative: 2 %
HCT: 27.2 % — ABNORMAL LOW (ref 39.0–52.0)
Hemoglobin: 8.9 g/dL — ABNORMAL LOW (ref 13.0–17.0)
Immature Granulocytes: 1 %
Lymphocytes Relative: 21 %
Lymphs Abs: 1.4 10*3/uL (ref 0.7–4.0)
MCH: 32.1 pg (ref 26.0–34.0)
MCHC: 32.7 g/dL (ref 30.0–36.0)
MCV: 98.2 fL (ref 80.0–100.0)
Monocytes Absolute: 0.5 10*3/uL (ref 0.1–1.0)
Monocytes Relative: 7 %
Neutro Abs: 4.8 10*3/uL (ref 1.7–7.7)
Neutrophils Relative %: 68 %
Platelets: 265 10*3/uL (ref 150–400)
RBC: 2.77 MIL/uL — ABNORMAL LOW (ref 4.22–5.81)
RDW: 26.7 % — ABNORMAL HIGH (ref 11.5–15.5)
WBC: 7 10*3/uL (ref 4.0–10.5)
nRBC: 0.6 % — ABNORMAL HIGH (ref 0.0–0.2)

## 2020-02-18 LAB — SAMPLE TO BLOOD BANK

## 2020-02-18 NOTE — Assessment & Plan Note (Addendum)
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  Normal FISH-very low risk.  Will order NGS at next visit.  #Reviewed the natural history of low-grade myelodysplastic syndrome-reviewed the revised IPSS-very low risk ~median survival around 8 years.   #Patient hemoglobin 8.5.  Patient is symptomatic.  Recommend starting Retacrit to improve hemoglobin-which will improve his energy levels/fatigue.  Discussed the potential side effects of Retacrit including but not limited to-fluid retention elevated blood pressure; rare risk of stroke/cardiac events if hemoglobin above 12.   # DISPOSITION: #  1 week- labs-H&H; iron studies/ ferritin- Aranesp  # follow up in 3 weeks- MD; labs-cbc/ aranesp- dr.B

## 2020-02-18 NOTE — Progress Notes (Signed)
[facility] NOTE  Patient Care Team: Baxter Hire, MD as PCP - General (Internal Medicine)  CHIEF COMPLAINTS/PURPOSE OF CONSULTATION: MDS  Oncology History Overview Note  # EGD-none; colonoscopy-never [[facility] x2- NEG];FEB 2021- US- abdomen-no liver disease/ Mild splenic enlargement [460cc; kidney cysts]; F63 folic acid/LDH haptoglobin normal.  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   # NGS/MOLECULAR TESTS:P  # PALLIATIVE CARE EVALUATION: NA   DIAGNOSIS: MDS low risk   GOALS: Control  CURRENT/MOST RECENT THERAPY : Retacrit    MDS (myelodysplastic syndrome), low grade ([place])  02/18/2020 Initial Diagnosis   MDS (myelodysplastic syndrome), low grade ([facility])       HISTORY OF PRESENTING ILLNESS:  Vernon [name] 74 y.o.  male with history of macrocytic anemia i is accompanied by his wife to discuss the results of his bone marrow biopsy.  Patient continues to have mild fatigue.  He denies any worsening shortness of breath or cough but denies any blood in stool stools or black or stools.   Review of Systems  Constitutional: Positive for malaise/fatigue. Negative for chills, diaphoresis, fever and weight loss.  HENT: Negative for nosebleeds and sore throat.   Eyes: Negative for double vision.  Respiratory: Negative for cough, hemoptysis, sputum production, shortness of breath and wheezing.   Cardiovascular: Negative for chest pain, palpitations, orthopnea and leg swelling.  Gastrointestinal: Negative for abdominal pain, blood in stool, constipation, diarrhea, heartburn, melena, nausea and vomiting.  Genitourinary: Negative for dysuria, frequency and urgency.   Musculoskeletal: Negative for back pain and joint pain.  Skin: Negative.  Negative for itching and rash.  Neurological: Negative for dizziness, tingling, focal weakness, weakness and headaches.  Endo/Heme/Allergies: Does not bruise/bleed easily.  Psychiatric/Behavioral: Negative for depression. The patient is not nervous/anxious and does not have insomnia.     MEDICAL HISTORY:  Past Medical History:  Diagnosis Date  . Anemia   . Arthritis   . Complication of anesthesia    hard time getting bp up after knee replacement  . Diabetes mellitus without complication ([place])   . GERD (gastroesophageal reflux disease)    occ tums prn  . History of hiatal hernia   . Hypertension     SURGICAL HISTORY: Past Surgical History:  Procedure Laterality Date  . CARPAL TUNNEL RELEASE  2012  . JOINT REPLACEMENT Right 2010  . SHOULDER ARTHROSCOPY WITH ROTATOR CUFF REPAIR AND OPEN BICEPS TENODESIS Right 11/09/2019   Procedure: RIGHT SHOULDER ARTHROSCOPY WITH SUBSCAPULARIS REPAIR, SUBACROMIAL DECOMPRESSION,MINI OPEN ROTATOR CUFF REPAIR;  Surgeon: Leim Fabry, MD;  Location: [facility] ORS;  Service: Orthopedics;  Laterality: Right;    SOCIAL HISTORY: Social History   Socioeconomic History  . Marital status: Married    Spouse name: Not on file  . Number of children: Not on file  . Years of education: Not on file  . Highest education level: Not on file  Occupational History  . Not on file  Tobacco Use  . Smoking status: Former Smoker    Packs/day: 0.50    Years: 8.00    Pack years: 4.00    Types: Cigarettes    Quit date: 07/21/1978    Years since quitting: 41.6  . Smokeless tobacco: Never Used  Substance and Sexual Activity  .  Alcohol use: Yes    Alcohol/week: 0.0 - 1.0 standard drinks    Comment: rare beer  . Drug use: Never  . Sexual activity: Not on file  Other Topics Concern  . Not on file  Social History Narrative   Lives in [place]; with wife; quit smoking in early 91s;  ocassional/ rare [may be 1 a month beer]. 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    Social Determinants of Health   Financial Resource Strain:   . Difficulty of Paying Living Expenses:   Food Insecurity:   . Worried About Charity fundraiser in the Last Year:   . Arboriculturist in the Last Year:   Transportation Needs:   . Film/video editor (Medical):   Marland Kitchen Lack of Transportation (Non-Medical):   Physical Activity:   . Days of Exercise per Week:   . Minutes of Exercise per Session:   Stress:   . Feeling of Stress :   Social Connections:   . Frequency of Communication with Friends and Family:   . Frequency of Social Gatherings with Friends and Family:   . Attends Religious Services:   . Active Member of Clubs or Organizations:   . Attends Archivist Meetings:   Marland Kitchen Marital Status:   Intimate Partner Violence:   . Fear of Current or Ex-Partner:   . Emotionally Abused:   Marland Kitchen Physically Abused:   . Sexually Abused:     FAMILY HISTORY: Family History  Problem Relation Age of Onset  . Cancer Maternal Uncle     ALLERGIES:  has No Known Allergies.  MEDICATIONS:  Current Outpatient Medications  Medication Sig Dispense Refill  . acetaminophen (TYLENOL) 500 MG tablet Take 2 tablets (1,000 mg total) by mouth every 8 (eight) hours. 90 tablet 2  . allopurinol (ZYLOPRIM) 300 MG tablet Take 300 mg by mouth at bedtime.     Marland Kitchen apixaban (ELIQUIS) 5 MG TABS tablet Take 5 mg by mouth 2 (two) times daily.     . blood glucose meter kit and supplies Use 1 kit as needed.    . Cyanocobalamin (B-12) 3000 MCG CAPS Take 3,000 mcg by mouth daily.    . ferrous sulfate 325 (65 FE) MG EC tablet Take 325 mg by mouth 3 (three) times daily with meals.    Marland Kitchen lisinopril (ZESTRIL) 40 MG tablet Take 40 mg by mouth at bedtime.     . metFORMIN (GLUCOPHAGE) 500 MG tablet Take 500 mg by mouth 2 (two) times daily with a meal.     . Multiple Vitamins-Minerals (CENTRUM SILVER PO) Take 1 tablet by mouth  daily.    . simvastatin (ZOCOR) 80 MG tablet Take 40 mg by mouth daily at 6 PM.      No current facility-administered medications for this visit.      PHYSICAL EXAMINATION:   Vitals:   02/18/20 0946  BP: (!) 142/79  Pulse: (!) 59  Resp: 20  Temp: 98.2 F (36.8 C)   Filed Weights   02/18/20 0946  Weight: 218 lb (98.9 kg)    Physical Exam  Constitutional: He is oriented to person, place, and time and well-developed, well-nourished, and in no distress.  HENT:  Head: Normocephalic and atraumatic.  Mouth/Throat: Oropharynx is clear and moist. No oropharyngeal exudate.  Eyes: Pupils are equal, round, and reactive to light.  Cardiovascular: Normal rate and regular rhythm.  Pulmonary/Chest: Effort normal and breath sounds normal. No respiratory distress. He has no wheezes.  Abdominal:  Soft. Bowel sounds are normal. He exhibits no distension and no mass. There is no abdominal tenderness. There is no rebound and no guarding.  Musculoskeletal:        General: No tenderness or edema. Normal range of motion.     Cervical back: Normal range of motion and neck supple.  Neurological: He is alert and oriented to person, place, and time.  Skin: Skin is warm.  Psychiatric: Affect normal.   LABORATORY DATA:  I have reviewed the data as listed Lab Results  Component Value Date   WBC 7.0 02/18/2020   HGB 8.9 (L) 02/18/2020   HCT 27.2 (L) 02/18/2020   MCV 98.2 02/18/2020   PLT 265 02/18/2020   Recent Labs    01/13/20 1250  NA 140  K 5.0  CL 110  CO2 21*  GLUCOSE 122*  BUN 29*  CREATININE 1.22  CALCIUM 9.5  GFRNONAA 58*  GFRAA >60  PROT 7.5  ALBUMIN 4.5  AST 18  ALT 14  ALKPHOS 87  BILITOT 2.8*     CT BONE MARROW BIOPSY & ASPIRATION  Result Date: 02/03/2020 INDICATION: 74 year old male with a history of macrocytic anemia EXAM: CT BONE MARROW BIOPSY AND ASPIRATION MEDICATIONS: None. ANESTHESIA/SEDATION: Moderate (conscious) sedation was employed during this procedure.  A total of Versed 2.0 mg and Fentanyl 100 mcg was administered intravenously. Moderate Sedation Time: 13 minutes. The patient's level of consciousness and vital signs were monitored continuously by radiology nursing throughout the procedure under my direct supervision. FLUOROSCOPY TIME:  CT COMPLICATIONS: None PROCEDURE: The procedure risks, benefits, and alternatives were explained to the patient. Questions regarding the procedure were encouraged and answered. The patient understands and consents to the procedure. Scout CT of the pelvis was performed for surgical planning purposes. The right posterior pelvis was prepped with Chlorhexidine in a sterile fashion, and a sterile drape was applied covering the operative field. A sterile gown and sterile gloves were used for the procedure. Local anesthesia was provided with 1% Lidocaine. Right posterior iliac bone was targeted for biopsy. The skin and subcutaneous tissues were infiltrated with 1% lidocaine without epinephrine. A small stab incision was made with an 11 blade scalpel, and an 11 gauge Murphy needle was advanced with CT guidance to the posterior cortex. Manual forced was used to advance the needle through the posterior cortex and the stylet was removed. A bone marrow aspirate was retrieved and passed to a cytotechnologist in the room. The Murphy needle was then advanced without the stylet for a core biopsy. The core biopsy was retrieved and also passed to a cytotechnologist. Manual pressure was used for hemostasis and a sterile dressing was placed. No complications were encountered no significant blood loss was encountered. Patient tolerated the procedure well and remained hemodynamically stable throughout. IMPRESSION: Status post CT-guided bone marrow biopsy, with tissue specimen sent to pathology for complete histopathologic analysis Signed, Dulcy Fanny. Earleen Newport, DO [facility] Electronically Signed    By: Corrie Mckusick D.O.   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  Normal FISH-very low risk.  Will order NGS at next visit.  #Reviewed the natural history of low-grade myelodysplastic syndrome-reviewed the revised IPSS-very low risk ~median survival around 8 years.   #Patient hemoglobin 8.5.  Patient is symptomatic.  Recommend starting Retacrit to improve hemoglobin-which will improve his energy levels/fatigue.  Discussed the potential side effects of Retacrit including but not  limited to-fluid retention elevated blood pressure; rare risk of stroke/cardiac events if hemoglobin above 12.   # DISPOSITION: #  1 week- labs-H&H; iron studies/ ferritin- Aranesp  # follow up in 3 weeks- MD; labs-cbc/ aranesp- dr.B  All questions were answered. The patient knows to call the clinic with any problems, questions or concerns.    Cammie Sickle, MD 02/25/2020 8:01 AM

## 2020-02-19 LAB — ERYTHROPOIETIN: Erythropoietin: 94 m[IU]/mL — ABNORMAL HIGH (ref 2.6–18.5)

## 2020-02-23 LAB — SURGICAL PATHOLOGY

## 2020-02-25 ENCOUNTER — Other Ambulatory Visit: Payer: Self-pay

## 2020-02-25 ENCOUNTER — Inpatient Hospital Stay: Payer: Medicare Other

## 2020-02-25 ENCOUNTER — Inpatient Hospital Stay: Payer: Medicare Other | Attending: Internal Medicine

## 2020-02-25 VITALS — BP 150/78 | HR 102 | Temp 96.4°F

## 2020-02-25 DIAGNOSIS — D4620 Refractory anemia with excess of blasts, unspecified: Secondary | ICD-10-CM | POA: Insufficient documentation

## 2020-02-25 DIAGNOSIS — N183 Chronic kidney disease, stage 3 unspecified: Secondary | ICD-10-CM | POA: Diagnosis not present

## 2020-02-25 LAB — IRON AND TIBC
Iron: 85 ug/dL (ref 45–182)
Saturation Ratios: 24 % (ref 17.9–39.5)
TIBC: 361 ug/dL (ref 250–450)
UIBC: 276 ug/dL

## 2020-02-25 LAB — FERRITIN: Ferritin: 837 ng/mL — ABNORMAL HIGH (ref 24–336)

## 2020-02-25 LAB — HEMOGLOBIN: Hemoglobin: 9 g/dL — ABNORMAL LOW (ref 13.0–17.0)

## 2020-02-25 LAB — HEMATOCRIT: HCT: 27.9 % — ABNORMAL LOW (ref 39.0–52.0)

## 2020-02-25 MED ORDER — EPOETIN ALFA-EPBX 10000 UNIT/ML IJ SOLN
20000.0000 [IU] | Freq: Once | INTRAMUSCULAR | Status: AC
  Administered 2020-02-25: 11:00:00 20000 [IU] via SUBCUTANEOUS
  Filled 2020-02-25: qty 2

## 2020-03-08 DIAGNOSIS — I350 Nonrheumatic aortic (valve) stenosis: Secondary | ICD-10-CM | POA: Insufficient documentation

## 2020-03-10 ENCOUNTER — Inpatient Hospital Stay: Payer: Medicare Other

## 2020-03-10 ENCOUNTER — Other Ambulatory Visit: Payer: Self-pay

## 2020-03-10 ENCOUNTER — Inpatient Hospital Stay: Payer: Medicare Other | Admitting: Internal Medicine

## 2020-03-10 DIAGNOSIS — D4620 Refractory anemia with excess of blasts, unspecified: Secondary | ICD-10-CM | POA: Diagnosis not present

## 2020-03-10 LAB — CBC WITH DIFFERENTIAL/PLATELET
Abs Immature Granulocytes: 0.03 10*3/uL (ref 0.00–0.07)
Basophils Absolute: 0.1 10*3/uL (ref 0.0–0.1)
Basophils Relative: 1 %
Eosinophils Absolute: 0.1 10*3/uL (ref 0.0–0.5)
Eosinophils Relative: 1 %
HCT: 28 % — ABNORMAL LOW (ref 39.0–52.0)
Hemoglobin: 9 g/dL — ABNORMAL LOW (ref 13.0–17.0)
Immature Granulocytes: 0 %
Lymphocytes Relative: 22 %
Lymphs Abs: 1.5 10*3/uL (ref 0.7–4.0)
MCH: 31.4 pg (ref 26.0–34.0)
MCHC: 32.1 g/dL (ref 30.0–36.0)
MCV: 97.6 fL (ref 80.0–100.0)
Monocytes Absolute: 0.5 10*3/uL (ref 0.1–1.0)
Monocytes Relative: 8 %
Neutro Abs: 4.5 10*3/uL (ref 1.7–7.7)
Neutrophils Relative %: 68 %
Platelets: 226 10*3/uL (ref 150–400)
RBC: 2.87 MIL/uL — ABNORMAL LOW (ref 4.22–5.81)
RDW: 26.4 % — ABNORMAL HIGH (ref 11.5–15.5)
WBC: 6.7 10*3/uL (ref 4.0–10.5)
nRBC: 0.7 % — ABNORMAL HIGH (ref 0.0–0.2)

## 2020-03-10 MED ORDER — EPOETIN ALFA-EPBX 10000 UNIT/ML IJ SOLN
20000.0000 [IU] | Freq: Once | INTRAMUSCULAR | Status: AC
  Administered 2020-03-10: 20000 [IU] via SUBCUTANEOUS
  Filled 2020-03-10: qty 2

## 2020-03-10 NOTE — Assessment & Plan Note (Addendum)
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  Normal FISH-very low risk. + We will discuss NGS/next visit.  #Continue Retacrit every 2 weeks for now.  Goal is greater than 10.  Hemoglobin today is 9.0.  No evidence of iron deficiency.  # DISPOSITION: [facility]- will disucss.  # Retacrit today # in 2 week- lab- H&H; possible Retacrit # in 4 weeks- lab- H&H; possible Retacrit # follow up in 6 weeks- MD; labs-cbc/bmp/retacrit; [facility]-Dr.B

## 2020-03-10 NOTE — Progress Notes (Signed)
[place] NOTE  Patient Care Team: Baxter Hire, MD as PCP - General (Internal Medicine)  CHIEF COMPLAINTS/PURPOSE OF CONSULTATION: MDS  Oncology History Overview Note  # EGD-none; colonoscopy-never [Cologurad x2- NEG];FEB 2021- US- abdomen-no liver disease/ Mild splenic enlargement [460cc; kidney cysts]; X32 folic acid/LDH haptoglobin normal.  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   # NGS/MOLECULAR TESTS:P  # PALLIATIVE CARE EVALUATION: NA   DIAGNOSIS: MDS low risk   GOALS: Control  CURRENT/MOST RECENT THERAPY : Retacrit    MDS (myelodysplastic syndrome), low grade ([place])  02/18/2020 Initial Diagnosis   MDS (myelodysplastic syndrome), low grade ([facility])       HISTORY OF PRESENTING ILLNESS:  Vernon [name] 74 y.o.  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  Patient denies any headaches or weight gain or fluid retention.  States his breathing is slightly better/energy level slightly better on Retacrit.   Review of Systems  Constitutional: Positive for malaise/fatigue. Negative for chills, diaphoresis, fever and weight loss.  HENT: Negative for nosebleeds and sore throat.   Eyes: Negative for double vision.  Respiratory: Negative for cough, hemoptysis, sputum production, shortness of breath and wheezing.   Cardiovascular: Negative for chest pain, palpitations, orthopnea and leg swelling.  Gastrointestinal: Negative for abdominal pain, blood in stool, constipation, diarrhea, heartburn, melena, nausea and vomiting.  Genitourinary: Negative for dysuria, frequency and urgency.  Musculoskeletal:  Negative for back pain and joint pain.  Skin: Negative.  Negative for itching and rash.  Neurological: Negative for dizziness, tingling, focal weakness, weakness and headaches.  Endo/Heme/Allergies: Does not bruise/bleed easily.  Psychiatric/Behavioral: Negative for depression. The patient is not nervous/anxious and does not have insomnia.     MEDICAL HISTORY:  Past Medical History:  Diagnosis Date  . Anemia   . Arthritis   . Complication of anesthesia    hard time getting bp up after knee replacement  . Diabetes mellitus without complication ([place])   . GERD (gastroesophageal reflux disease)    occ tums prn  . History of hiatal hernia   . Hypertension     SURGICAL HISTORY: Past Surgical History:  Procedure Laterality Date  . CARPAL TUNNEL RELEASE  2012  . JOINT REPLACEMENT Right 2010  . SHOULDER ARTHROSCOPY WITH ROTATOR CUFF REPAIR AND OPEN BICEPS TENODESIS Right 11/09/2019   Procedure: RIGHT SHOULDER ARTHROSCOPY WITH SUBSCAPULARIS REPAIR, SUBACROMIAL DECOMPRESSION,MINI OPEN ROTATOR CUFF REPAIR;  Surgeon: Leim Fabry, MD;  Location: [facility] ORS;  Service: Orthopedics;  Laterality: Right;    SOCIAL HISTORY: Social History   Socioeconomic History  . Marital status: Married    Spouse name: Not on file  . Number of children: Not on file  . Years of education: Not on file  . Highest education level: Not on file  Occupational History  . Not on file  Tobacco Use  . Smoking status: Former Smoker    Packs/day: 0.50    Years: 8.00    Pack years: 4.00    Types: Cigarettes    Quit date: 07/21/1978    Years since quitting: 41.6  . Smokeless tobacco: Never Used  Substance and Sexual Activity  . Alcohol use: Yes    Alcohol/week: 0.0 - 1.0 standard  drinks    Comment: rare beer  . Drug use: Never  . Sexual activity: Not on file  Other Topics Concern  . Not on file  Social History Narrative   Lives in [place]; with wife; quit smoking in early 45s; ocassional/ rare [may be 1  a month beer]. 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    Social Determinants of Health   Financial Resource Strain:   . Difficulty of Paying Living Expenses:   Food Insecurity:   . Worried About Charity fundraiser in the Last Year:   . Arboriculturist in the Last Year:   Transportation Needs:   . Film/video editor (Medical):   Marland Kitchen Lack of Transportation (Non-Medical):   Physical Activity:   . Days of Exercise per Week:   . Minutes of Exercise per Session:   Stress:   . Feeling of Stress :   Social Connections:   . Frequency of Communication with Friends and Family:   . Frequency of Social Gatherings with Friends and Family:   . Attends Religious Services:   . Active Member of Clubs or Organizations:   . Attends Archivist Meetings:   Marland Kitchen Marital Status:   Intimate Partner Violence:   . Fear of Current or Ex-Partner:   . Emotionally Abused:   Marland Kitchen Physically Abused:   . Sexually Abused:     FAMILY HISTORY: Family History  Problem Relation Age of Onset  . Cancer Maternal Uncle     ALLERGIES:  has No Known Allergies.  MEDICATIONS:  Current Outpatient Medications  Medication Sig Dispense Refill  . allopurinol (ZYLOPRIM) 300 MG tablet Take 300 mg by mouth at bedtime.     Marland Kitchen apixaban (ELIQUIS) 5 MG TABS tablet Take 5 mg by mouth 2 (two) times daily.     . blood glucose meter kit and supplies Use 1 kit as needed.    . Cyanocobalamin (B-12) 3000 MCG CAPS Take 3,000 mcg by mouth daily.    . ferrous sulfate 325 (65 FE) MG EC tablet Take 325 mg by mouth 3 (three) times daily with meals.    Marland Kitchen lisinopril (ZESTRIL) 40 MG tablet Take 40 mg by mouth at bedtime.     . metFORMIN (GLUCOPHAGE) 500 MG tablet Take 500 mg by mouth 2 (two) times daily with a meal.     . Multiple Vitamins-Minerals (CENTRUM SILVER PO) Take 1 tablet by mouth daily.    . simvastatin (ZOCOR) 80 MG tablet Take 40 mg by mouth daily at 6 PM.     . acetaminophen (TYLENOL) 500 MG tablet Take 2 tablets  (1,000 mg total) by mouth every 8 (eight) hours. (Patient not taking: Reported on 03/10/2020) 90 tablet 2   No current facility-administered medications for this visit.   Facility-Administered Medications Ordered in Other Visits  Medication Dose Route Frequency Provider Last Rate Last Admin  . epoetin alfa-epbx (RETACRIT) injection 20,000 Units  20,000 Units Subcutaneous Once Charlaine Dalton R, MD          PHYSICAL EXAMINATION:   Vitals:   03/10/20 1054  BP: (!) 145/67  Pulse: 87  Resp: 20  Temp: (!) 97.3 F (36.3 C)   Filed Weights   03/10/20 1054  Weight: 216 lb (98 kg)    Physical Exam  Constitutional: He is oriented to person, place, and time and well-developed, well-nourished, and in no distress.  HENT:  Head: Normocephalic and atraumatic.  Mouth/Throat: Oropharynx is clear and moist. No oropharyngeal exudate.  Eyes: Pupils are equal, round, and reactive to light.  Cardiovascular: Normal rate and regular rhythm.  Pulmonary/Chest: Effort normal and breath sounds normal. No respiratory distress. He has no wheezes.  Abdominal: Soft. Bowel sounds are normal. He exhibits no distension and no mass. There is no abdominal tenderness. There is no rebound and no guarding.  Musculoskeletal:        General: No tenderness or edema. Normal range of motion.     Cervical back: Normal range of motion and neck supple.  Neurological: He is alert and oriented to person, place, and time.  Skin: Skin is warm.  Psychiatric: Affect normal.   LABORATORY DATA:  I have reviewed the data as listed Lab Results  Component Value Date   WBC 6.7 03/10/2020   HGB 9.0 (L) 03/10/2020   HCT 28.0 (L) 03/10/2020   MCV 97.6 03/10/2020   PLT 226 03/10/2020   Recent Labs    01/13/20 1250  NA 140  K 5.0  CL 110  CO2 21*  GLUCOSE 122*  BUN 29*  CREATININE 1.22  CALCIUM 9.5  GFRNONAA 58*  GFRAA >60  PROT 7.5  ALBUMIN 4.5  AST 18  ALT 14  ALKPHOS 87  BILITOT 2.8*     No results  found.  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  Normal FISH-very low risk. + We will discuss NGS/next visit.  #Continue Retacrit every 2 weeks for now.  Goal is greater than 10.  Hemoglobin today is 9.0.  No evidence of iron deficiency.  # DISPOSITION: [facility] One-Hem- will disucss.  # Retacrit today # in 2 week- lab- H&H; possible Retacrit # in 4 weeks- lab- H&H; possible Retacrit # follow up in 6 weeks- MD; labs-cbc/bmp/retacrit; [facility]-Dr.B  All questions were answered. The patient knows to call the clinic with any problems, questions or concerns.    Cammie Sickle, MD 03/10/2020 11:41 AM

## 2020-03-23 ENCOUNTER — Other Ambulatory Visit: Payer: Self-pay | Admitting: *Deleted

## 2020-03-23 DIAGNOSIS — D4620 Refractory anemia with excess of blasts, unspecified: Secondary | ICD-10-CM

## 2020-03-23 DIAGNOSIS — D539 Nutritional anemia, unspecified: Secondary | ICD-10-CM

## 2020-03-24 ENCOUNTER — Other Ambulatory Visit: Payer: Self-pay

## 2020-03-24 ENCOUNTER — Inpatient Hospital Stay: Payer: Medicare Other

## 2020-03-24 VITALS — BP 128/74

## 2020-03-24 DIAGNOSIS — D539 Nutritional anemia, unspecified: Secondary | ICD-10-CM

## 2020-03-24 DIAGNOSIS — D4620 Refractory anemia with excess of blasts, unspecified: Secondary | ICD-10-CM

## 2020-03-24 LAB — HEMOGLOBIN: Hemoglobin: 9 g/dL — ABNORMAL LOW (ref 13.0–17.0)

## 2020-03-24 LAB — HEMATOCRIT: HCT: 27.7 % — ABNORMAL LOW (ref 39.0–52.0)

## 2020-03-24 MED ORDER — EPOETIN ALFA-EPBX 10000 UNIT/ML IJ SOLN
20000.0000 [IU] | Freq: Once | INTRAMUSCULAR | Status: AC
  Administered 2020-03-24: 10:00:00 20000 [IU] via SUBCUTANEOUS
  Filled 2020-03-24: qty 2

## 2020-04-07 ENCOUNTER — Other Ambulatory Visit: Payer: Self-pay

## 2020-04-07 ENCOUNTER — Inpatient Hospital Stay: Payer: Medicare Other

## 2020-04-07 ENCOUNTER — Inpatient Hospital Stay: Payer: Medicare Other | Attending: Internal Medicine

## 2020-04-07 VITALS — BP 137/66 | HR 53

## 2020-04-07 DIAGNOSIS — D4620 Refractory anemia with excess of blasts, unspecified: Secondary | ICD-10-CM

## 2020-04-07 DIAGNOSIS — D539 Nutritional anemia, unspecified: Secondary | ICD-10-CM

## 2020-04-07 LAB — HEMOGLOBIN: Hemoglobin: 9.2 g/dL — ABNORMAL LOW (ref 13.0–17.0)

## 2020-04-07 LAB — HEMATOCRIT: HCT: 28.1 % — ABNORMAL LOW (ref 39.0–52.0)

## 2020-04-07 MED ORDER — EPOETIN ALFA-EPBX 10000 UNIT/ML IJ SOLN
20000.0000 [IU] | Freq: Once | INTRAMUSCULAR | Status: AC
  Administered 2020-04-07: 20000 [IU] via SUBCUTANEOUS
  Filled 2020-04-07: qty 2

## 2020-04-20 ENCOUNTER — Encounter: Payer: Self-pay | Admitting: Internal Medicine

## 2020-04-20 NOTE — Progress Notes (Signed)
Patient called for pre assessment. He denies any pain or concerns at this time.  

## 2020-04-21 ENCOUNTER — Other Ambulatory Visit: Payer: Self-pay

## 2020-04-21 ENCOUNTER — Inpatient Hospital Stay: Payer: Medicare Other | Admitting: Internal Medicine

## 2020-04-21 ENCOUNTER — Inpatient Hospital Stay: Payer: Medicare Other

## 2020-04-21 DIAGNOSIS — D4620 Refractory anemia with excess of blasts, unspecified: Secondary | ICD-10-CM | POA: Diagnosis not present

## 2020-04-21 DIAGNOSIS — D539 Nutritional anemia, unspecified: Secondary | ICD-10-CM

## 2020-04-21 LAB — CBC WITH DIFFERENTIAL/PLATELET
Abs Immature Granulocytes: 0.02 10*3/uL (ref 0.00–0.07)
Basophils Absolute: 0 10*3/uL (ref 0.0–0.1)
Basophils Relative: 1 %
Eosinophils Absolute: 0.2 10*3/uL (ref 0.0–0.5)
Eosinophils Relative: 3 %
HCT: 26.1 % — ABNORMAL LOW (ref 39.0–52.0)
Hemoglobin: 8.8 g/dL — ABNORMAL LOW (ref 13.0–17.0)
Immature Granulocytes: 0 %
Lymphocytes Relative: 26 %
Lymphs Abs: 1.6 10*3/uL (ref 0.7–4.0)
MCH: 32.6 pg (ref 26.0–34.0)
MCHC: 33.7 g/dL (ref 30.0–36.0)
MCV: 96.7 fL (ref 80.0–100.0)
Monocytes Absolute: 0.5 10*3/uL (ref 0.1–1.0)
Monocytes Relative: 8 %
Neutro Abs: 3.9 10*3/uL (ref 1.7–7.7)
Neutrophils Relative %: 62 %
Platelets: 220 10*3/uL (ref 150–400)
RBC: 2.7 MIL/uL — ABNORMAL LOW (ref 4.22–5.81)
RDW: 26.5 % — ABNORMAL HIGH (ref 11.5–15.5)
WBC: 6.2 10*3/uL (ref 4.0–10.5)
nRBC: 0.5 % — ABNORMAL HIGH (ref 0.0–0.2)

## 2020-04-21 LAB — BASIC METABOLIC PANEL
Anion gap: 6 (ref 5–15)
BUN: 36 mg/dL — ABNORMAL HIGH (ref 8–23)
CO2: 23 mmol/L (ref 22–32)
Calcium: 9.4 mg/dL (ref 8.9–10.3)
Chloride: 110 mmol/L (ref 98–111)
Creatinine, Ser: 1.47 mg/dL — ABNORMAL HIGH (ref 0.61–1.24)
GFR calc Af Amer: 54 mL/min — ABNORMAL LOW (ref >60)
GFR calc non Af Amer: 47 mL/min — ABNORMAL LOW (ref >60)
Glucose, Bld: 154 mg/dL — ABNORMAL HIGH (ref 70–99)
Potassium: 4.8 mmol/L (ref 3.5–5.1)
Sodium: 139 mmol/L (ref 135–145)

## 2020-04-21 MED ORDER — EPOETIN ALFA-EPBX 10000 UNIT/ML IJ SOLN
20000.0000 [IU] | Freq: Once | INTRAMUSCULAR | Status: AC
  Administered 2020-04-21: 20000 [IU] via SUBCUTANEOUS
  Filled 2020-04-21: qty 2

## 2020-04-21 NOTE — Assessment & Plan Note (Addendum)
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  Normal FISH-very low risk.  Discussed regarding use of foundation 1 heme-to help evaluate the molecular genetics/prognostic markers.  #Continue Retacrit every 2 weeks for now.  Goal is greater than 10.  Hemoglobin today is 8.8;  No evidence of iron deficiency; continue PO iron TID.   # DISPOSITION:  # Retacrit today # in 2 week- lab- H&H; possible Retacrit # in 4 weeks- lab- H&H; possible Retacrit # follow up in 6 weeks- MD; labs-cbc/bmp/iron studies/ferritin; retacrit;-Dr.B

## 2020-04-21 NOTE — Progress Notes (Signed)
[facility] CONSULT NOTE  Patient Care Team: Johnston, John D, MD as PCP - General (Internal Medicine)  CHIEF COMPLAINTS/PURPOSE OF CONSULTATION: MDS  Oncology History Overview Note  # EGD-none; colonoscopy-never [Cologurad x2- NEG];FEB 2021- US- abdomen-no liver disease/ Mild splenic enlargement [460cc; kidney cysts]; B12 folic acid/LDH haptoglobin normal.  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   # NGS/MOLECULAR TESTS:P  # PALLIATIVE CARE EVALUATION: NA   DIAGNOSIS: MDS low risk   GOALS: Control  CURRENT/MOST RECENT THERAPY : Retacrit    MDS (myelodysplastic syndrome), low grade ([facility])  02/18/2020 Initial Diagnosis   MDS (myelodysplastic syndrome), low grade ([facility])       HISTORY OF PRESENTING ILLNESS:  Vernon [name] 73 y.o.  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  Patient continues to deny any unusual headaches.  Denies any weight gain or fluid retention or swelling in the legs.  His energy levels are slightly improved while on Retacrit.   Review of Systems  Constitutional: Positive for malaise/fatigue. Negative for chills, diaphoresis, fever and weight loss.  HENT: Negative for nosebleeds and sore throat.   Eyes: Negative for double vision.  Respiratory: Negative for cough, hemoptysis, sputum production, shortness of breath and wheezing.   Cardiovascular: Negative for chest pain, palpitations, orthopnea and leg swelling.  Gastrointestinal: Negative for abdominal pain, blood in stool, constipation, diarrhea, heartburn, melena, nausea and vomiting.  Genitourinary: Negative for dysuria, frequency and  urgency.  Musculoskeletal: Negative for back pain and joint pain.  Skin: Negative.  Negative for itching and rash.  Neurological: Negative for dizziness, tingling, focal weakness, weakness and headaches.  Endo/Heme/Allergies: Does not bruise/bleed easily.  Psychiatric/Behavioral: Negative for depression. The patient is not nervous/anxious and does not have insomnia.     MEDICAL HISTORY:  Past Medical History:  Diagnosis Date  . Anemia   . Arthritis   . Complication of anesthesia    hard time getting bp up after knee replacement  . Diabetes mellitus without complication ([facility])   . GERD (gastroesophageal reflux disease)    occ tums prn  . History of hiatal hernia   . Hypertension     SURGICAL HISTORY: Past Surgical History:  Procedure Laterality Date  . CARPAL TUNNEL RELEASE  2012  . JOINT REPLACEMENT Right 2010  . SHOULDER ARTHROSCOPY WITH ROTATOR CUFF REPAIR AND OPEN BICEPS TENODESIS Right 11/09/2019   Procedure: RIGHT SHOULDER ARTHROSCOPY WITH SUBSCAPULARIS REPAIR, SUBACROMIAL DECOMPRESSION,MINI OPEN ROTATOR CUFF REPAIR;  Surgeon: Patel, Sunny, MD;  Location: [facility] ORS;  Service: Orthopedics;  Laterality: Right;    SOCIAL HISTORY: Social History   Socioeconomic History  . Marital status: Married    Spouse name: Not on file  . Number of children: Not on file  . Years of education: Not on file  . Highest education level: Not on file  Occupational History  . Not on file  Tobacco Use  . Smoking status: Former Smoker    Packs/day: 0.50    Years: 8.00    Pack years: 4.00    Types: Cigarettes    Quit date: 07/21/1978    Years since quitting: 41.7  . Smokeless tobacco: Never Used  Substance and Sexual Activity  . Alcohol use: Yes      Alcohol/week: 0.0 - 1.0 standard drinks    Comment: rare beer  . Drug use: Never  . Sexual activity: Not on file  Other Topics Concern  . Not on file  Social History Narrative   Lives in [place]; with wife; quit smoking in early 77s;  ocassional/ rare [may be 1 a month beer]. 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    Social Determinants of Health   Financial Resource Strain:   . Difficulty of Paying Living Expenses:   Food Insecurity:   . Worried About Charity fundraiser in the Last Year:   . Arboriculturist in the Last Year:   Transportation Needs:   . Film/video editor (Medical):   Marland Kitchen Lack of Transportation (Non-Medical):   Physical Activity:   . Days of Exercise per Week:   . Minutes of Exercise per Session:   Stress:   . Feeling of Stress :   Social Connections:   . Frequency of Communication with Friends and Family:   . Frequency of Social Gatherings with Friends and Family:   . Attends Religious Services:   . Active Member of Clubs or Organizations:   . Attends Archivist Meetings:   Marland Kitchen Marital Status:   Intimate Partner Violence:   . Fear of Current or Ex-Partner:   . Emotionally Abused:   Marland Kitchen Physically Abused:   . Sexually Abused:     FAMILY HISTORY: Family History  Problem Relation Age of Onset  . Cancer Maternal Uncle     ALLERGIES:  has No Known Allergies.  MEDICATIONS:  Current Outpatient Medications  Medication Sig Dispense Refill  . acetaminophen (TYLENOL) 650 MG CR tablet Take 650 mg by mouth every 8 (eight) hours as needed for pain.    Marland Kitchen allopurinol (ZYLOPRIM) 300 MG tablet Take 300 mg by mouth at bedtime.     Marland Kitchen apixaban (ELIQUIS) 5 MG TABS tablet Take 5 mg by mouth 2 (two) times daily.     . blood glucose meter kit and supplies Use 1 kit as needed.    . Cyanocobalamin (B-12) 3000 MCG CAPS Take 3,000 mcg by mouth daily.    . ferrous sulfate 325 (65 FE) MG EC tablet Take 325 mg by mouth 3 (three) times daily with meals. Patient reports takes once a day    . lisinopril (ZESTRIL) 40 MG tablet Take 40 mg by mouth at bedtime.     . metFORMIN (GLUCOPHAGE) 500 MG tablet Take 500 mg by mouth 2 (two) times daily with a meal.     . Multiple Vitamins-Minerals (CENTRUM SILVER  PO) Take 1 tablet by mouth daily.    . simvastatin (ZOCOR) 80 MG tablet Take 40 mg by mouth daily at 6 PM.      No current facility-administered medications for this visit.      PHYSICAL EXAMINATION:   Vitals:   04/21/20 0952  BP: 130/64  Pulse: 72  Resp: 18  Temp: (!) 97.4 F (36.3 C)  SpO2: 100%   Filed Weights   04/21/20 0952  Weight: 218 lb 6.4 oz (99.1 kg)    Physical Exam  Constitutional: He is oriented to person, place, and time and well-developed, well-nourished, and in no distress.  HENT:  Head: Normocephalic and atraumatic.  Mouth/Throat: Oropharynx is clear and moist. No oropharyngeal exudate.  Eyes: Pupils are equal, round, and reactive to light.  Cardiovascular: Normal rate and regular rhythm.  Pulmonary/Chest: Effort normal and breath sounds normal. No respiratory distress. He  has no wheezes.  Abdominal: Soft. Bowel sounds are normal. He exhibits no distension and no mass. There is no abdominal tenderness. There is no rebound and no guarding.  Musculoskeletal:        General: No tenderness or edema. Normal range of motion.     Cervical back: Normal range of motion and neck supple.  Neurological: He is alert and oriented to person, place, and time.  Skin: Skin is warm.  Psychiatric: Affect normal.   LABORATORY DATA:  I have reviewed the data as listed Lab Results  Component Value Date   WBC 6.2 04/21/2020   HGB 8.8 (L) 04/21/2020   HCT 26.1 (L) 04/21/2020   MCV 96.7 04/21/2020   PLT 220 04/21/2020   Recent Labs    01/13/20 1250 04/21/20 0924  NA 140 139  K 5.0 4.8  CL 110 110  CO2 21* 23  GLUCOSE 122* 154*  BUN 29* 36*  CREATININE 1.22 1.47*  CALCIUM 9.5 9.4  GFRNONAA 58* 47*  GFRAA >60 54*  PROT 7.5  --   ALBUMIN 4.5  --   AST 18  --   ALT 14  --   ALKPHOS 87  --   BILITOT 2.8*  --      No results found.  MDS (myelodysplastic syndrome), low grade ([facility]) #Low-grade myelodysplastic syndrome-refractory anemia with ringed  sideroblasts.  Normal FISH-very low risk.  Discussed regarding use of foundation 1 heme-to help evaluate the molecular genetics/prognostic markers.  #Continue Retacrit every 2 weeks for now.  Goal is greater than 10.  Hemoglobin today is 8.8;  No evidence of iron deficiency; continue PO iron TID.   # DISPOSITION:  # Retacrit today # in 2 week- lab- H&H; possible Retacrit # in 4 weeks- lab- H&H; possible Retacrit # follow up in 6 weeks- MD; labs-cbc/bmp/iron studies/ferritin; retacrit;-Dr.B  All questions were answered. The patient knows to call the clinic with any problems, questions or concerns.     R , MD 04/25/2020 7:12 PM   

## 2020-05-05 ENCOUNTER — Inpatient Hospital Stay: Payer: Medicare Other

## 2020-05-05 ENCOUNTER — Other Ambulatory Visit: Payer: Self-pay

## 2020-05-05 ENCOUNTER — Inpatient Hospital Stay: Payer: Medicare Other | Attending: Internal Medicine

## 2020-05-05 VITALS — BP 114/62 | HR 67

## 2020-05-05 DIAGNOSIS — D4620 Refractory anemia with excess of blasts, unspecified: Secondary | ICD-10-CM

## 2020-05-05 LAB — HEMOGLOBIN: Hemoglobin: 8.7 g/dL — ABNORMAL LOW (ref 13.0–17.0)

## 2020-05-05 LAB — HEMATOCRIT: HCT: 25.7 % — ABNORMAL LOW (ref 39.0–52.0)

## 2020-05-05 MED ORDER — EPOETIN ALFA-EPBX 10000 UNIT/ML IJ SOLN
20000.0000 [IU] | Freq: Once | INTRAMUSCULAR | Status: AC
  Administered 2020-05-05: 20000 [IU] via SUBCUTANEOUS
  Filled 2020-05-05: qty 2

## 2020-05-06 ENCOUNTER — Encounter: Payer: Self-pay | Admitting: Internal Medicine

## 2020-05-19 ENCOUNTER — Inpatient Hospital Stay: Payer: Medicare Other

## 2020-05-19 ENCOUNTER — Other Ambulatory Visit: Payer: Self-pay

## 2020-05-19 VITALS — BP 111/63 | HR 63

## 2020-05-19 DIAGNOSIS — D4620 Refractory anemia with excess of blasts, unspecified: Secondary | ICD-10-CM

## 2020-05-19 LAB — HEMOGLOBIN: Hemoglobin: 8.2 g/dL — ABNORMAL LOW (ref 13.0–17.0)

## 2020-05-19 LAB — HEMATOCRIT: HCT: 24.3 % — ABNORMAL LOW (ref 39.0–52.0)

## 2020-05-19 MED ORDER — EPOETIN ALFA-EPBX 10000 UNIT/ML IJ SOLN
20000.0000 [IU] | Freq: Once | INTRAMUSCULAR | Status: AC
  Administered 2020-05-19: 20000 [IU] via SUBCUTANEOUS
  Filled 2020-05-19: qty 2

## 2020-06-01 ENCOUNTER — Other Ambulatory Visit: Payer: Self-pay

## 2020-06-02 ENCOUNTER — Other Ambulatory Visit: Payer: Self-pay

## 2020-06-02 ENCOUNTER — Encounter (INDEPENDENT_AMBULATORY_CARE_PROVIDER_SITE_OTHER): Payer: Self-pay

## 2020-06-02 ENCOUNTER — Inpatient Hospital Stay: Payer: Medicare Other

## 2020-06-02 ENCOUNTER — Inpatient Hospital Stay: Payer: Medicare Other | Admitting: Internal Medicine

## 2020-06-02 ENCOUNTER — Inpatient Hospital Stay: Payer: Medicare Other | Attending: Internal Medicine

## 2020-06-02 ENCOUNTER — Encounter: Payer: Self-pay | Admitting: Internal Medicine

## 2020-06-02 DIAGNOSIS — D4620 Refractory anemia with excess of blasts, unspecified: Secondary | ICD-10-CM

## 2020-06-02 DIAGNOSIS — N183 Chronic kidney disease, stage 3 unspecified: Secondary | ICD-10-CM | POA: Insufficient documentation

## 2020-06-02 DIAGNOSIS — D460 Refractory anemia without ring sideroblasts, so stated: Secondary | ICD-10-CM | POA: Insufficient documentation

## 2020-06-02 LAB — CBC WITH DIFFERENTIAL/PLATELET
Abs Immature Granulocytes: 0.03 10*3/uL (ref 0.00–0.07)
Basophils Absolute: 0 10*3/uL (ref 0.0–0.1)
Basophils Relative: 0 %
Eosinophils Absolute: 0.1 10*3/uL (ref 0.0–0.5)
Eosinophils Relative: 2 %
HCT: 25.7 % — ABNORMAL LOW (ref 39.0–52.0)
Hemoglobin: 8.6 g/dL — ABNORMAL LOW (ref 13.0–17.0)
Immature Granulocytes: 1 %
Lymphocytes Relative: 27 %
Lymphs Abs: 1.4 10*3/uL (ref 0.7–4.0)
MCH: 33.1 pg (ref 26.0–34.0)
MCHC: 33.5 g/dL (ref 30.0–36.0)
MCV: 98.8 fL (ref 80.0–100.0)
Monocytes Absolute: 0.4 10*3/uL (ref 0.1–1.0)
Monocytes Relative: 8 %
Neutro Abs: 3.4 10*3/uL (ref 1.7–7.7)
Neutrophils Relative %: 62 %
Platelets: 229 10*3/uL (ref 150–400)
RBC: 2.6 MIL/uL — ABNORMAL LOW (ref 4.22–5.81)
RDW: 25.2 % — ABNORMAL HIGH (ref 11.5–15.5)
WBC: 5.3 10*3/uL (ref 4.0–10.5)
nRBC: 0.7 % — ABNORMAL HIGH (ref 0.0–0.2)

## 2020-06-02 LAB — IRON AND TIBC
Iron: 166 ug/dL (ref 45–182)
Saturation Ratios: 53 % — ABNORMAL HIGH (ref 17.9–39.5)
TIBC: 312 ug/dL (ref 250–450)
UIBC: 146 ug/dL

## 2020-06-02 LAB — BASIC METABOLIC PANEL
Anion gap: 9 (ref 5–15)
BUN: 29 mg/dL — ABNORMAL HIGH (ref 8–23)
CO2: 22 mmol/L (ref 22–32)
Calcium: 9.4 mg/dL (ref 8.9–10.3)
Chloride: 108 mmol/L (ref 98–111)
Creatinine, Ser: 1.38 mg/dL — ABNORMAL HIGH (ref 0.61–1.24)
GFR calc Af Amer: 58 mL/min — ABNORMAL LOW (ref >60)
GFR calc non Af Amer: 50 mL/min — ABNORMAL LOW (ref >60)
Glucose, Bld: 169 mg/dL — ABNORMAL HIGH (ref 70–99)
Potassium: 4.9 mmol/L (ref 3.5–5.1)
Sodium: 139 mmol/L (ref 135–145)

## 2020-06-02 LAB — FERRITIN: Ferritin: 707 ng/mL — ABNORMAL HIGH (ref 24–336)

## 2020-06-02 MED ORDER — EPOETIN ALFA-EPBX 10000 UNIT/ML IJ SOLN
20000.0000 [IU] | Freq: Once | INTRAMUSCULAR | Status: AC
  Administered 2020-06-02: 20000 [IU] via SUBCUTANEOUS

## 2020-06-02 MED ORDER — EPOETIN ALFA-EPBX 40000 UNIT/ML IJ SOLN
20000.0000 [IU] | Freq: Once | INTRAMUSCULAR | Status: DC
  Filled 2020-06-02: qty 1

## 2020-06-02 NOTE — Progress Notes (Signed)
[place] NOTE  Patient Care Team: Baxter Hire, MD as PCP - General (Internal Medicine) Cammie Sickle, MD as Consulting Physician (Hematology and Oncology)  CHIEF COMPLAINTS/PURPOSE OF CONSULTATION: MDS  Oncology History Overview Note  # EGD-none; colonoscopy-never [Cologurad x2- NEG];FEB 2021- US- abdomen-no liver disease/ Mild splenic enlargement [460cc; kidney cysts]; W10 folic acid/LDH haptoglobin normal.  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  male with history of recently diagnosed low-grade MDS currently on Retacrit is here for follow-up/review results of the foundation 1.  Denies any swelling in the legs.  Admits to mild improvement of his energy levels.  No blood in stools or black or stools..   Review of Systems  Constitutional: Positive for malaise/fatigue. Negative for chills, diaphoresis, fever and weight loss.  HENT: Negative for nosebleeds and sore throat.   Eyes: Negative for double vision.  Respiratory: Negative for cough, hemoptysis, sputum production, shortness of breath and wheezing.   Cardiovascular: Negative for chest pain, palpitations, orthopnea and leg swelling.  Gastrointestinal: Negative for abdominal pain, blood in stool,  constipation, diarrhea, heartburn, melena, nausea and vomiting.  Genitourinary: Negative for dysuria, frequency and urgency.  Musculoskeletal: Negative for back pain and joint pain.  Skin: Negative.  Negative for itching and rash.  Neurological: Negative for dizziness, tingling, focal weakness, weakness and headaches.  Endo/Heme/Allergies: Does not bruise/bleed easily.  Psychiatric/Behavioral: Negative for depression. The patient is not nervous/anxious and does not have insomnia.     MEDICAL HISTORY:  Past Medical History:  Diagnosis Date  . Anemia   . Arthritis   . Complication of anesthesia    hard time getting bp up after knee replacement  . Diabetes mellitus without complication ([place])   . GERD (gastroesophageal reflux disease)    occ tums prn  . History of hiatal hernia   . Hypertension     SURGICAL HISTORY: Past Surgical History:  Procedure Laterality Date  . CARPAL TUNNEL RELEASE  2012  . JOINT REPLACEMENT Right 2010  . SHOULDER ARTHROSCOPY WITH ROTATOR CUFF REPAIR AND OPEN BICEPS TENODESIS Right 11/09/2019   Procedure: RIGHT SHOULDER ARTHROSCOPY WITH SUBSCAPULARIS REPAIR, SUBACROMIAL DECOMPRESSION,MINI OPEN ROTATOR CUFF REPAIR;  Surgeon: Leim Fabry, MD;  Location: [facility] ORS;  Service: Orthopedics;  Laterality: Right;    SOCIAL HISTORY: Social History   Socioeconomic History  . Marital status: Married    Spouse name: Not on file  . Number of children: Not on file  . Years of education: Not on file  . Highest education level: Not on file  Occupational History  . Not on file  Tobacco Use  . Smoking status: Former Smoker    Packs/day: 0.50    Years: 8.00    Pack years: 4.00    Types: Cigarettes    Quit date: 07/21/1978    Years since quitting: 41.9  .  Smokeless tobacco: Never Used  Substance and Sexual Activity  . Alcohol use: Yes    Alcohol/week: 0.0 - 1.0 standard drinks    Comment: rare beer  . Drug use: Never  . Sexual activity: Not on file  Other  Topics Concern  . Not on file  Social History Narrative   Lives in [place]; with wife; quit smoking in early 38s; ocassional/ rare [may be 1 a month beer]. 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    Social Determinants of Health   Financial Resource Strain:   . Difficulty of Paying Living Expenses:   Food Insecurity:   . Worried About Charity fundraiser in the Last Year:   . Arboriculturist in the Last Year:   Transportation Needs:   . Film/video editor (Medical):   Marland Kitchen Lack of Transportation (Non-Medical):   Physical Activity:   . Days of Exercise per Week:   . Minutes of Exercise per Session:   Stress:   . Feeling of Stress :   Social Connections:   . Frequency of Communication with Friends and Family:   . Frequency of Social Gatherings with Friends and Family:   . Attends Religious Services:   . Active Member of Clubs or Organizations:   . Attends Archivist Meetings:   Marland Kitchen Marital Status:   Intimate Partner Violence:   . Fear of Current or Ex-Partner:   . Emotionally Abused:   Marland Kitchen Physically Abused:   . Sexually Abused:     FAMILY HISTORY: Family History  Problem Relation Age of Onset  . Cancer Maternal Uncle     ALLERGIES:  has No Known Allergies.  MEDICATIONS:  Current Outpatient Medications  Medication Sig Dispense Refill  . acetaminophen (TYLENOL) 650 MG CR tablet Take 650 mg by mouth every 8 (eight) hours as needed for pain.    Marland Kitchen allopurinol (ZYLOPRIM) 300 MG tablet Take 300 mg by mouth at bedtime.     Marland Kitchen apixaban (ELIQUIS) 5 MG TABS tablet Take 5 mg by mouth 2 (two) times daily.     . blood glucose meter kit and supplies Use 1 kit as needed.    . Cyanocobalamin (B-12) 3000 MCG CAPS Take 3,000 mcg by mouth daily.    . ferrous sulfate 325 (65 FE) MG EC tablet Take 325 mg by mouth 3 (three) times daily with meals. Patient reports takes once a day    . lisinopril (ZESTRIL) 40 MG tablet Take 40 mg by mouth at bedtime.     . metFORMIN  (GLUCOPHAGE) 500 MG tablet Take 500 mg by mouth 2 (two) times daily with a meal.     . Multiple Vitamins-Minerals (CENTRUM SILVER PO) Take 1 tablet by mouth daily.    . simvastatin (ZOCOR) 80 MG tablet Take 40 mg by mouth daily at 6 PM.      No current facility-administered medications for this visit.      PHYSICAL EXAMINATION:   Vitals:   06/02/20 0933  BP: (!) 110/48  Pulse: (!) 52  Resp: 18  Temp: (!) 97.5 F (36.4 C)  SpO2: 100%   Filed Weights   06/02/20 0933  Weight: 214 lb (97.1 kg)    Physical Exam HENT:     Head: Normocephalic and atraumatic.     Mouth/Throat:     Pharynx: No oropharyngeal exudate.  Eyes:     Pupils: Pupils are equal, round, and reactive to light.  Cardiovascular:     Rate and Rhythm:  Normal rate and regular rhythm.  Pulmonary:     Effort: Pulmonary effort is normal. No respiratory distress.     Breath sounds: Normal breath sounds. No wheezing.  Abdominal:     General: Bowel sounds are normal. There is no distension.     Palpations: Abdomen is soft. There is no mass.     Tenderness: There is no abdominal tenderness. There is no guarding or rebound.  Musculoskeletal:        General: No tenderness. Normal range of motion.     Cervical back: Normal range of motion and neck supple.  Skin:    General: Skin is warm.  Neurological:     Mental Status: He is alert and oriented to person, place, and time.  Psychiatric:        Mood and Affect: Affect normal.    LABORATORY DATA:  I have reviewed the data as listed Lab Results  Component Value Date   WBC 5.3 06/02/2020   HGB 8.6 (L) 06/02/2020   HCT 25.7 (L) 06/02/2020   MCV 98.8 06/02/2020   PLT 229 06/02/2020   Recent Labs    01/13/20 1250 04/21/20 0924 06/02/20 0901  NA 140 139 139  K 5.0 4.8 4.9  CL 110 110 108  CO2 21* 23 22  GLUCOSE 122* 154* 169*  BUN 29* 36* 29*  CREATININE 1.22 1.47* 1.38*  CALCIUM 9.5 9.4 9.4  GFRNONAA 58* 47* 50*  GFRAA >60 54* 58*  PROT 7.5  --    --   ALBUMIN 4.5  --   --   AST 18  --   --   ALT 14  --   --   ALKPHOS 87  --   --   BILITOT 2.8*  --   --      No results found.  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  Normal FISH-very low risk.  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 Discussed the absence of any aggressive genetic alterations.   #Continue Retacrit every 2 weeks for now.  Goal is ~10.  Hemoglobin today is 8.8; currently on PO iron TID. Awaiting iron studies from today.  # Stage kidney disease-stage III [etiologies include-diabetes hypertension vascular disease]-recommend increase hydration; control of risk factors.   I spoke at length with the patient's wife regarding the patient's clinical status/plan of care.  Family agreement. Given a copy of foundation 1 heme.  # DISPOSITION:  # Retacrit today # in 2 week- lab- H&H; possible Retacrit # in 4 weeks- lab- H&H; possible Retacrit # # in 6 weeks- lab- H&H; possible Retacrit # follow up in 8 weeks- MD; labs-cbc/bmp/ retacrit;-Dr.B  All questions were answered. The patient knows to call the clinic with any problems, questions or concerns.    Cammie Sickle, MD 06/07/2020 11:11 PM

## 2020-06-02 NOTE — Assessment & Plan Note (Signed)
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  Normal FISH-very low risk.  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 Discussed the absence of any aggressive genetic alterations.   #Continue Retacrit every 2 weeks for now.  Goal is ~10.  Hemoglobin today is 8.8; currently on PO iron TID. Awaiting iron studies from today.  # Stage kidney disease-stage III [etiologies include-diabetes hypertension vascular disease]-recommend increase hydration; control of risk factors.   I spoke at length with the patient's wife regarding the patient's clinical status/plan of care.  Family agreement. Given a copy of foundation 1 heme.  # DISPOSITION:  # Retacrit today # in 2 week- lab- H&H; possible Retacrit # in 4 weeks- lab- H&H; possible Retacrit # # in 6 weeks- lab- H&H; possible Retacrit # follow up in 8 weeks- MD; labs-cbc/bmp/ retacrit;-Dr.B

## 2020-06-16 ENCOUNTER — Other Ambulatory Visit: Payer: Self-pay

## 2020-06-16 ENCOUNTER — Inpatient Hospital Stay: Payer: Medicare Other

## 2020-06-16 VITALS — BP 118/70 | HR 67

## 2020-06-16 DIAGNOSIS — D4620 Refractory anemia with excess of blasts, unspecified: Secondary | ICD-10-CM

## 2020-06-16 DIAGNOSIS — D460 Refractory anemia without ring sideroblasts, so stated: Secondary | ICD-10-CM | POA: Diagnosis not present

## 2020-06-16 LAB — HEMATOCRIT: HCT: 25.5 % — ABNORMAL LOW (ref 39.0–52.0)

## 2020-06-16 LAB — HEMOGLOBIN: Hemoglobin: 8.5 g/dL — ABNORMAL LOW (ref 13.0–17.0)

## 2020-06-16 MED ORDER — EPOETIN ALFA-EPBX 10000 UNIT/ML IJ SOLN
20000.0000 [IU] | Freq: Once | INTRAMUSCULAR | Status: AC
  Administered 2020-06-16: 20000 [IU] via SUBCUTANEOUS
  Filled 2020-06-16: qty 2

## 2020-06-30 ENCOUNTER — Other Ambulatory Visit: Payer: Self-pay

## 2020-06-30 ENCOUNTER — Inpatient Hospital Stay: Payer: Medicare Other

## 2020-06-30 ENCOUNTER — Inpatient Hospital Stay: Payer: Medicare Other | Attending: Internal Medicine

## 2020-06-30 VITALS — BP 126/70 | HR 70

## 2020-06-30 DIAGNOSIS — D461 Refractory anemia with ring sideroblasts: Secondary | ICD-10-CM | POA: Diagnosis not present

## 2020-06-30 DIAGNOSIS — D4620 Refractory anemia with excess of blasts, unspecified: Secondary | ICD-10-CM

## 2020-06-30 DIAGNOSIS — N183 Chronic kidney disease, stage 3 unspecified: Secondary | ICD-10-CM | POA: Diagnosis not present

## 2020-06-30 LAB — HEMOGLOBIN: Hemoglobin: 8.8 g/dL — ABNORMAL LOW (ref 13.0–17.0)

## 2020-06-30 LAB — HEMATOCRIT: HCT: 26.3 % — ABNORMAL LOW (ref 39.0–52.0)

## 2020-06-30 MED ORDER — EPOETIN ALFA-EPBX 10000 UNIT/ML IJ SOLN
20000.0000 [IU] | Freq: Once | INTRAMUSCULAR | Status: AC
  Administered 2020-06-30: 20000 [IU] via SUBCUTANEOUS
  Filled 2020-06-30: qty 2

## 2020-07-14 ENCOUNTER — Inpatient Hospital Stay: Payer: Medicare Other

## 2020-07-14 ENCOUNTER — Other Ambulatory Visit: Payer: Self-pay

## 2020-07-14 VITALS — BP 134/62 | HR 67

## 2020-07-14 DIAGNOSIS — D4620 Refractory anemia with excess of blasts, unspecified: Secondary | ICD-10-CM

## 2020-07-14 DIAGNOSIS — D461 Refractory anemia with ring sideroblasts: Secondary | ICD-10-CM | POA: Diagnosis not present

## 2020-07-14 LAB — HEMOGLOBIN: Hemoglobin: 8.3 g/dL — ABNORMAL LOW (ref 13.0–17.0)

## 2020-07-14 LAB — HEMATOCRIT: HCT: 24.6 % — ABNORMAL LOW (ref 39.0–52.0)

## 2020-07-14 MED ORDER — EPOETIN ALFA-EPBX 10000 UNIT/ML IJ SOLN
20000.0000 [IU] | Freq: Once | INTRAMUSCULAR | Status: AC
  Administered 2020-07-14: 20000 [IU] via SUBCUTANEOUS
  Filled 2020-07-14: qty 2

## 2020-07-28 ENCOUNTER — Encounter: Payer: Self-pay | Admitting: Internal Medicine

## 2020-07-28 ENCOUNTER — Other Ambulatory Visit: Payer: Self-pay

## 2020-07-28 ENCOUNTER — Inpatient Hospital Stay: Payer: Medicare Other

## 2020-07-28 ENCOUNTER — Inpatient Hospital Stay: Payer: Medicare Other | Admitting: Internal Medicine

## 2020-07-28 ENCOUNTER — Inpatient Hospital Stay: Payer: Medicare Other | Attending: Internal Medicine

## 2020-07-28 DIAGNOSIS — N183 Chronic kidney disease, stage 3 unspecified: Secondary | ICD-10-CM | POA: Diagnosis not present

## 2020-07-28 DIAGNOSIS — D460 Refractory anemia without ring sideroblasts, so stated: Secondary | ICD-10-CM | POA: Diagnosis not present

## 2020-07-28 DIAGNOSIS — D4620 Refractory anemia with excess of blasts, unspecified: Secondary | ICD-10-CM

## 2020-07-28 LAB — CBC WITH DIFFERENTIAL/PLATELET
Abs Immature Granulocytes: 0.03 10*3/uL (ref 0.00–0.07)
Basophils Absolute: 0 10*3/uL (ref 0.0–0.1)
Basophils Relative: 1 %
Eosinophils Absolute: 0.1 10*3/uL (ref 0.0–0.5)
Eosinophils Relative: 2 %
HCT: 24 % — ABNORMAL LOW (ref 39.0–52.0)
Hemoglobin: 8.1 g/dL — ABNORMAL LOW (ref 13.0–17.0)
Immature Granulocytes: 1 %
Lymphocytes Relative: 25 %
Lymphs Abs: 1.3 10*3/uL (ref 0.7–4.0)
MCH: 34.8 pg — ABNORMAL HIGH (ref 26.0–34.0)
MCHC: 33.8 g/dL (ref 30.0–36.0)
MCV: 103 fL — ABNORMAL HIGH (ref 80.0–100.0)
Monocytes Absolute: 0.4 10*3/uL (ref 0.1–1.0)
Monocytes Relative: 8 %
Neutro Abs: 3.5 10*3/uL (ref 1.7–7.7)
Neutrophils Relative %: 63 %
Platelets: 198 10*3/uL (ref 150–400)
RBC: 2.33 MIL/uL — ABNORMAL LOW (ref 4.22–5.81)
RDW: 23.3 % — ABNORMAL HIGH (ref 11.5–15.5)
WBC: 5.4 10*3/uL (ref 4.0–10.5)
nRBC: 0.9 % — ABNORMAL HIGH (ref 0.0–0.2)

## 2020-07-28 LAB — BASIC METABOLIC PANEL
Anion gap: 11 (ref 5–15)
BUN: 28 mg/dL — ABNORMAL HIGH (ref 8–23)
CO2: 21 mmol/L — ABNORMAL LOW (ref 22–32)
Calcium: 8.6 mg/dL — ABNORMAL LOW (ref 8.9–10.3)
Chloride: 107 mmol/L (ref 98–111)
Creatinine, Ser: 1.26 mg/dL — ABNORMAL HIGH (ref 0.61–1.24)
GFR calc Af Amer: >60 mL/min (ref >60)
GFR calc non Af Amer: 56 mL/min — ABNORMAL LOW (ref >60)
Glucose, Bld: 156 mg/dL — ABNORMAL HIGH (ref 70–99)
Potassium: 4.6 mmol/L (ref 3.5–5.1)
Sodium: 139 mmol/L (ref 135–145)

## 2020-07-28 MED ORDER — EPOETIN ALFA-EPBX 10000 UNIT/ML IJ SOLN
20000.0000 [IU] | Freq: Once | INTRAMUSCULAR | Status: AC
  Administered 2020-07-28: 20000 [IU] via SUBCUTANEOUS
  Filled 2020-07-28: qty 2

## 2020-07-28 NOTE — Assessment & Plan Note (Addendum)
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  Goal is ~10.  Hemoglobin today is 8.; currently on PO iron TID. Iron studies- adequate. Discussed that we could go up on the dose to 40,K if pt having any worsening fatigue; also discussed re: lupatercept SQ. as patient is overall asymptomatic recommend continue dose/frequency.  # Stage kidney disease-stage III- STABLE.    # DISPOSITION:  # Retacrit today # in 2 week- lab- H&H; possible Retacrit # in 4 weeks- lab- H&H; possible Retacrit # # in 6 weeks- lab- H&H; possible Retacrit # follow up in 8 weeks- MD; labs-cbc/bmp/ retacrit;-Dr.B

## 2020-07-28 NOTE — Progress Notes (Signed)
[place] NOTE  Patient Care Team: Baxter Hire, MD as PCP - General (Internal Medicine) Cammie Sickle, MD as Consulting Physician (Hematology and Oncology)  CHIEF COMPLAINTS/PURPOSE OF CONSULTATION: MDS  Oncology History Overview Note  # EGD-none; colonoscopy-never [Cologurad x2- NEG];FEB 2021- US- abdomen-no liver disease/ Mild splenic enlargement [460cc; kidney cysts]; Z32 folic acid/LDH haptoglobin normal.  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   He has mild fatigue.  Not any worse.  Denies any swelling in the legs.  No cough.   Review of Systems  Constitutional: Positive for malaise/fatigue. Negative for chills, diaphoresis, fever and weight loss.  HENT: Negative for nosebleeds and sore throat.   Eyes: Negative for double vision.  Respiratory: Negative for cough, hemoptysis, sputum production, shortness of breath and wheezing.   Cardiovascular: Negative for chest pain, palpitations, orthopnea and leg swelling.  Gastrointestinal: Negative for abdominal pain, blood in stool, constipation, diarrhea, heartburn, melena, nausea and vomiting.  Genitourinary:  Negative for dysuria, frequency and urgency.  Musculoskeletal: Negative for back pain and joint pain.  Skin: Negative.  Negative for itching and rash.  Neurological: Negative for dizziness, tingling, focal weakness, weakness and headaches.  Endo/Heme/Allergies: Does not bruise/bleed easily.  Psychiatric/Behavioral: Negative for depression. The patient is not nervous/anxious and does not have insomnia.     MEDICAL HISTORY:  Past Medical History:  Diagnosis Date  . Anemia   . Arthritis   . Complication of anesthesia    hard time getting bp up after knee replacement  . Diabetes mellitus without complication ([place])   . GERD (gastroesophageal reflux disease)    occ tums prn  . History of hiatal hernia   . Hypertension     SURGICAL HISTORY: Past Surgical History:  Procedure Laterality Date  . CARPAL TUNNEL RELEASE  2012  . JOINT REPLACEMENT Right 2010  . SHOULDER ARTHROSCOPY WITH ROTATOR CUFF REPAIR AND OPEN BICEPS TENODESIS Right 11/09/2019   Procedure: RIGHT SHOULDER ARTHROSCOPY WITH SUBSCAPULARIS REPAIR, SUBACROMIAL DECOMPRESSION,MINI OPEN ROTATOR CUFF REPAIR;  Surgeon: Leim Fabry, MD;  Location: [facility] ORS;  Service: Orthopedics;  Laterality: Right;    SOCIAL HISTORY: Social History   Socioeconomic History  . Marital status: Married    Spouse name: Not on file  . Number of children: Not on file  . Years of education: Not on file  . Highest education level: Not on file  Occupational History  . Not on file  Tobacco Use  . Smoking status: Former Smoker    Packs/day: 0.50    Years: 8.00    Pack years: 4.00    Types: Cigarettes    Quit date: 07/21/1978    Years since quitting: 42.0  . Smokeless tobacco: Never Used  Substance and Sexual  Activity  . Alcohol use: Yes    Alcohol/week: 0.0 - 1.0 standard drinks    Comment: rare beer  . Drug use: Never  . Sexual activity: Not on file  Other Topics Concern  . Not on file  Social History Narrative   Lives in [place];  with wife; quit smoking in early 97s; ocassional/ rare [may be 1 a month beer]. 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    Social Determinants of Health   Financial Resource Strain:   . Difficulty of Paying Living Expenses: Not on file  Food Insecurity:   . Worried About Charity fundraiser in the Last Year: Not on file  . Ran Out of Food in the Last Year: Not on file  Transportation Needs:   . Lack of Transportation (Medical): Not on file  . Lack of Transportation (Non-Medical): Not on file  Physical Activity:   . Days of Exercise per Week: Not on file  . Minutes of Exercise per Session: Not on file  Stress:   . Feeling of Stress : Not on file  Social Connections:   . Frequency of Communication with Friends and Family: Not on file  . Frequency of Social Gatherings with Friends and Family: Not on file  . Attends Religious Services: Not on file  . Active Member of Clubs or Organizations: Not on file  . Attends Archivist Meetings: Not on file  . Marital Status: Not on file  Intimate Partner Violence:   . Fear of Current or Ex-Partner: Not on file  . Emotionally Abused: Not on file  . Physically Abused: Not on file  . Sexually Abused: Not on file    FAMILY HISTORY: Family History  Problem Relation Age of Onset  . Cancer Maternal Uncle     ALLERGIES:  has No Known Allergies.  MEDICATIONS:  Current Outpatient Medications  Medication Sig Dispense Refill  . acetaminophen (TYLENOL) 650 MG CR tablet Take 650 mg by mouth every 8 (eight) hours as needed for pain.    Marland Kitchen allopurinol (ZYLOPRIM) 300 MG tablet Take 300 mg by mouth at bedtime.     Marland Kitchen apixaban (ELIQUIS) 5 MG TABS tablet Take 5 mg by mouth 2 (two) times daily.     . blood glucose meter kit and supplies Use 1 kit as needed.    . Cyanocobalamin (B-12) 3000 MCG CAPS Take 3,000 mcg by mouth daily.    . ferrous sulfate 325 (65 FE) MG EC tablet Take 325 mg by mouth 3 (three) times daily with meals. Patient reports  takes once a day    . lisinopril (ZESTRIL) 40 MG tablet Take 40 mg by mouth at bedtime.     . metFORMIN (GLUCOPHAGE) 500 MG tablet Take 500 mg by mouth 2 (two) times daily with a meal.     . Multiple Vitamins-Minerals (CENTRUM SILVER PO) Take 1 tablet by mouth daily.    . simvastatin (ZOCOR) 80 MG tablet Take 40 mg by mouth daily at 6 PM.      No current facility-administered medications for this visit.      PHYSICAL EXAMINATION:   Vitals:   07/28/20 1025  BP: (!) 104/40  Pulse: (!) 43  Resp: 16  Temp: 98.1 F (36.7 C)  SpO2: 100%   Filed Weights   07/28/20 1025  Weight: 221 lb (100.2 kg)    Physical Exam HENT:     Head: Normocephalic and atraumatic.     Mouth/Throat:     Pharynx:  No oropharyngeal exudate.  Eyes:     Pupils: Pupils are equal, round, and reactive to light.  Cardiovascular:     Rate and Rhythm: Normal rate and regular rhythm.  Pulmonary:     Effort: Pulmonary effort is normal. No respiratory distress.     Breath sounds: Normal breath sounds. No wheezing.  Abdominal:     General: Bowel sounds are normal. There is no distension.     Palpations: Abdomen is soft. There is no mass.     Tenderness: There is no abdominal tenderness. There is no guarding or rebound.  Musculoskeletal:        General: No tenderness. Normal range of motion.     Cervical back: Normal range of motion and neck supple.  Skin:    General: Skin is warm.  Neurological:     Mental Status: He is alert and oriented to person, place, and time.  Psychiatric:        Mood and Affect: Affect normal.    LABORATORY DATA:  I have reviewed the data as listed Lab Results  Component Value Date   WBC 5.4 07/28/2020   HGB 8.1 (L) 07/28/2020   HCT 24.0 (L) 07/28/2020   MCV 103.0 (H) 07/28/2020   PLT 198 07/28/2020   Recent Labs    01/13/20 1250 01/13/20 1250 04/21/20 0924 06/02/20 0901 07/28/20 0938  NA 140   < > 139 139 139  K 5.0   < > 4.8 4.9 4.6  CL 110   < > 110 108 107   CO2 21*   < > 23 22 21*  GLUCOSE 122*   < > 154* 169* 156*  BUN 29*   < > 36* 29* 28*  CREATININE 1.22   < > 1.47* 1.38* 1.26*  CALCIUM 9.5   < > 9.4 9.4 8.6*  GFRNONAA 58*   < > 47* 50* 56*  GFRAA >60   < > 54* 58* >60  PROT 7.5  --   --   --   --   ALBUMIN 4.5  --   --   --   --   AST 18  --   --   --   --   ALT 14  --   --   --   --   ALKPHOS 87  --   --   --   --   BILITOT 2.8*  --   --   --   --    < > = values in this interval not displayed.     No results found.  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  Goal is ~10.  Hemoglobin today is 8.; currently on PO iron TID. Iron studies- adequate. Discussed that we could go up on the dose to 40,K if pt having any worsening fatigue; also discussed re: lupatercept SQ. as patient is overall asymptomatic recommend continue dose/frequency.  # Stage kidney disease-stage III- STABLE.    # DISPOSITION:  # Retacrit today # in 2 week- lab- H&H; possible Retacrit # in 4 weeks- lab- H&H; possible Retacrit # # in 6 weeks- lab- H&H; possible Retacrit # follow up in 8 weeks- MD; labs-cbc/bmp/ retacrit;-Dr.B  All questions were answered. The patient knows to call the clinic with any problems, questions or concerns.    Cammie Sickle, MD 07/28/2020 12:45 PM

## 2020-08-11 ENCOUNTER — Inpatient Hospital Stay: Payer: Medicare Other

## 2020-08-11 ENCOUNTER — Other Ambulatory Visit: Payer: Self-pay

## 2020-08-11 VITALS — BP 155/65 | HR 50

## 2020-08-11 DIAGNOSIS — D4620 Refractory anemia with excess of blasts, unspecified: Secondary | ICD-10-CM

## 2020-08-11 DIAGNOSIS — D460 Refractory anemia without ring sideroblasts, so stated: Secondary | ICD-10-CM | POA: Diagnosis not present

## 2020-08-11 LAB — HEMATOCRIT: HCT: 23.4 % — ABNORMAL LOW (ref 39.0–52.0)

## 2020-08-11 LAB — HEMOGLOBIN: Hemoglobin: 7.9 g/dL — ABNORMAL LOW (ref 13.0–17.0)

## 2020-08-11 MED ORDER — EPOETIN ALFA-EPBX 10000 UNIT/ML IJ SOLN
20000.0000 [IU] | Freq: Once | INTRAMUSCULAR | Status: AC
  Administered 2020-08-11: 20000 [IU] via SUBCUTANEOUS
  Filled 2020-08-11: qty 2

## 2020-08-25 ENCOUNTER — Other Ambulatory Visit: Payer: Self-pay

## 2020-08-25 ENCOUNTER — Inpatient Hospital Stay: Payer: Medicare Other

## 2020-08-25 VITALS — BP 129/70 | HR 41

## 2020-08-25 DIAGNOSIS — D460 Refractory anemia without ring sideroblasts, so stated: Secondary | ICD-10-CM | POA: Diagnosis not present

## 2020-08-25 DIAGNOSIS — D4620 Refractory anemia with excess of blasts, unspecified: Secondary | ICD-10-CM

## 2020-08-25 LAB — HEMATOCRIT: HCT: 24.7 % — ABNORMAL LOW (ref 39.0–52.0)

## 2020-08-25 LAB — HEMOGLOBIN: Hemoglobin: 8.2 g/dL — ABNORMAL LOW (ref 13.0–17.0)

## 2020-08-25 MED ORDER — EPOETIN ALFA-EPBX 10000 UNIT/ML IJ SOLN
20000.0000 [IU] | Freq: Once | INTRAMUSCULAR | Status: AC
  Administered 2020-08-25: 20000 [IU] via SUBCUTANEOUS
  Filled 2020-08-25: qty 2

## 2020-09-08 ENCOUNTER — Other Ambulatory Visit: Payer: Self-pay

## 2020-09-08 ENCOUNTER — Inpatient Hospital Stay: Payer: Medicare Other

## 2020-09-08 ENCOUNTER — Inpatient Hospital Stay: Payer: Medicare Other | Attending: Internal Medicine

## 2020-09-08 VITALS — BP 115/45 | HR 41 | Temp 97.7°F | Resp 20

## 2020-09-08 DIAGNOSIS — D4620 Refractory anemia with excess of blasts, unspecified: Secondary | ICD-10-CM

## 2020-09-08 DIAGNOSIS — D460 Refractory anemia without ring sideroblasts, so stated: Secondary | ICD-10-CM | POA: Insufficient documentation

## 2020-09-08 LAB — HEMOGLOBIN: Hemoglobin: 7.8 g/dL — ABNORMAL LOW (ref 13.0–17.0)

## 2020-09-08 LAB — HEMATOCRIT: HCT: 24 % — ABNORMAL LOW (ref 39.0–52.0)

## 2020-09-08 MED ORDER — EPOETIN ALFA-EPBX 10000 UNIT/ML IJ SOLN
20000.0000 [IU] | Freq: Once | INTRAMUSCULAR | Status: AC
  Administered 2020-09-08: 20000 [IU] via SUBCUTANEOUS
  Filled 2020-09-08: qty 2

## 2020-09-22 ENCOUNTER — Inpatient Hospital Stay: Payer: Medicare Other

## 2020-09-22 ENCOUNTER — Encounter: Payer: Self-pay | Admitting: Internal Medicine

## 2020-09-22 ENCOUNTER — Inpatient Hospital Stay: Payer: Medicare Other | Admitting: Internal Medicine

## 2020-09-22 ENCOUNTER — Other Ambulatory Visit: Payer: Self-pay

## 2020-09-22 DIAGNOSIS — D4620 Refractory anemia with excess of blasts, unspecified: Secondary | ICD-10-CM

## 2020-09-22 DIAGNOSIS — D460 Refractory anemia without ring sideroblasts, so stated: Secondary | ICD-10-CM | POA: Diagnosis not present

## 2020-09-22 LAB — BASIC METABOLIC PANEL
Anion gap: 7 (ref 5–15)
BUN: 30 mg/dL — ABNORMAL HIGH (ref 8–23)
CO2: 23 mmol/L (ref 22–32)
Calcium: 9.2 mg/dL (ref 8.9–10.3)
Chloride: 110 mmol/L (ref 98–111)
Creatinine, Ser: 1.38 mg/dL — ABNORMAL HIGH (ref 0.61–1.24)
GFR, Estimated: 54 mL/min — ABNORMAL LOW (ref >60)
Glucose, Bld: 178 mg/dL — ABNORMAL HIGH (ref 70–99)
Potassium: 4.9 mmol/L (ref 3.5–5.1)
Sodium: 140 mmol/L (ref 135–145)

## 2020-09-22 LAB — CBC WITH DIFFERENTIAL/PLATELET
Abs Immature Granulocytes: 0.03 10*3/uL (ref 0.00–0.07)
Basophils Absolute: 0 10*3/uL (ref 0.0–0.1)
Basophils Relative: 1 %
Eosinophils Absolute: 0.2 10*3/uL (ref 0.0–0.5)
Eosinophils Relative: 3 %
HCT: 25.8 % — ABNORMAL LOW (ref 39.0–52.0)
Hemoglobin: 8.4 g/dL — ABNORMAL LOW (ref 13.0–17.0)
Immature Granulocytes: 1 %
Lymphocytes Relative: 22 %
Lymphs Abs: 1.3 10*3/uL (ref 0.7–4.0)
MCH: 34.1 pg — ABNORMAL HIGH (ref 26.0–34.0)
MCHC: 32.6 g/dL (ref 30.0–36.0)
MCV: 104.9 fL — ABNORMAL HIGH (ref 80.0–100.0)
Monocytes Absolute: 0.5 10*3/uL (ref 0.1–1.0)
Monocytes Relative: 8 %
Neutro Abs: 4 10*3/uL (ref 1.7–7.7)
Neutrophils Relative %: 65 %
Platelets: 203 10*3/uL (ref 150–400)
RBC: 2.46 MIL/uL — ABNORMAL LOW (ref 4.22–5.81)
RDW: 25.2 % — ABNORMAL HIGH (ref 11.5–15.5)
WBC: 6 10*3/uL (ref 4.0–10.5)
nRBC: 0.3 % — ABNORMAL HIGH (ref 0.0–0.2)

## 2020-09-22 MED ORDER — EPOETIN ALFA-EPBX 10000 UNIT/ML IJ SOLN
20000.0000 [IU] | Freq: Once | INTRAMUSCULAR | Status: AC
  Administered 2020-09-22: 20000 [IU] via SUBCUTANEOUS
  Filled 2020-09-22: qty 2

## 2020-09-22 NOTE — Assessment & Plan Note (Addendum)
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  Patient currently on Retacrit [since April 2021]; hemoglobin overall stable between 7.8-8.  #Continues to be fairly asymptomatic [except for his wife that he is "nods off to sleep"].  Continue p.o. iron at this time.  #I discussed at length regarding use of Luspatercept especially given SF 3 B1 mutation.  I discussed the mechanism of action/targeted therapy helps with maturation process of the blood cells.  Again reviewed the potential side effects including but not limited to fatigue; arthritis and nausea vomiting diarrhea.  In general the potential effects are quite mild; and should be well-tolerated.  The response rates in order of 40%.  After lengthy discussion patient/and his wife want to monitor closely over the next few weeks.however if no significant benefit noted with Retacrit/or patient gets more symptomatic-we will switch to Luspatercept.  # Stage kidney disease-stage III- STABLE.  Again reviewed the renal function.  Discussed importance of avoiding nephrotoxic agents.   # DISPOSITION:  # Retacrit today # in 2 week- lab- H&H; possible Retacrit # in 4 weeks- lab- H&H; possible Retacrit # # in 6 weeks- MD; labs-cbc/bmp/ retacrit;-Dr.B

## 2020-09-23 NOTE — Progress Notes (Signed)
[place] NOTE  Patient Care Team: Baxter Hire, MD as PCP - General (Internal Medicine) Cammie Sickle, MD as Consulting Physician (Hematology and Oncology)  CHIEF COMPLAINTS/PURPOSE OF CONSULTATION: MDS  Oncology History Overview Note  # EGD-none; colonoscopy-never [Cologurad x2- NEG];FEB 2021- US- abdomen-no liver disease/ Mild splenic enlargement [460cc; kidney cysts]; G25 folic acid/LDH haptoglobin normal.  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   # NGS/MOLECULAR TESTS:[facility] ON hem- May 2021- Cux-1; DNMT3A; SF3B**  # PALLIATIVE CARE EVALUATION: NA   DIAGNOSIS: MDS low risk   GOALS: Control  CURRENT/MOST RECENT THERAPY : Retacrit    MDS (myelodysplastic syndrome), low grade ([place])  02/18/2020 Initial Diagnosis   MDS (myelodysplastic syndrome), low grade ([facility])       HISTORY OF PRESENTING ILLNESS:  Vernon [name] 74 y.o.  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  Patient denies any complaints of worsening fatigue.  Denies any swelling in the legs.  Denies any cough.  However as per wife patient has been nodding off more frequently.  Patient hemoglobin has been around 8.  Has not needed any PRBC transfusion for last 6 months.  Review of Systems  Constitutional: Positive for malaise/fatigue. Negative for chills, diaphoresis, fever and weight loss.  HENT: Negative for nosebleeds and sore throat.   Eyes: Negative for double vision.  Respiratory: Negative for cough, hemoptysis, sputum production, shortness of breath and wheezing.   Cardiovascular: Negative for chest pain,  palpitations, orthopnea and leg swelling.  Gastrointestinal: Negative for abdominal pain, blood in stool, constipation, diarrhea, heartburn, melena, nausea and vomiting.  Genitourinary: Negative for dysuria, frequency and urgency.  Musculoskeletal: Negative for back pain and joint pain.  Skin: Negative.  Negative for itching and rash.  Neurological: Negative for dizziness, tingling, focal weakness, weakness and headaches.  Endo/Heme/Allergies: Does not bruise/bleed easily.  Psychiatric/Behavioral: Negative for depression. The patient is not nervous/anxious and does not have insomnia.     MEDICAL HISTORY:  Past Medical History:  Diagnosis Date  . Anemia   . Arthritis   . Complication of anesthesia    hard time getting bp up after knee replacement  . Diabetes mellitus without complication ([place])   . GERD (gastroesophageal reflux disease)    occ tums prn  . History of hiatal hernia   . Hypertension     SURGICAL HISTORY: Past Surgical History:  Procedure Laterality Date  . CARPAL TUNNEL RELEASE  2012  . JOINT REPLACEMENT Right 2010  . SHOULDER ARTHROSCOPY WITH ROTATOR CUFF REPAIR AND OPEN BICEPS TENODESIS Right 11/09/2019   Procedure: RIGHT SHOULDER ARTHROSCOPY WITH SUBSCAPULARIS REPAIR, SUBACROMIAL DECOMPRESSION,MINI OPEN ROTATOR CUFF REPAIR;  Surgeon: Leim Fabry, MD;  Location: [facility] ORS;  Service: Orthopedics;  Laterality: Right;    SOCIAL HISTORY: Social History   Socioeconomic History  . Marital status: Married    Spouse name: Not on file  . Number of children: Not on file  . Years of education: Not on file  . Highest education level: Not on file  Occupational History  . Not on file  Tobacco Use  . Smoking status: Former Smoker    Packs/day: 0.50    Years: 8.00    Pack years: 4.00  Types: Cigarettes    Quit date: 07/21/1978    Years since quitting: 42.2  . Smokeless tobacco: Never Used  Substance and Sexual Activity  . Alcohol use: Yes    Alcohol/week: 0.0 -  1.0 standard drinks    Comment: rare beer  . Drug use: Never  . Sexual activity: Not on file  Other Topics Concern  . Not on file  Social History Narrative   Lives in [place]; with wife; quit smoking in early 94s; ocassional/ rare [may be 1 a month beer]. 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    Social Determinants of Health   Financial Resource Strain:   . Difficulty of Paying Living Expenses: Not on file  Food Insecurity:   . Worried About Charity fundraiser in the Last Year: Not on file  . Ran Out of Food in the Last Year: Not on file  Transportation Needs:   . Lack of Transportation (Medical): Not on file  . Lack of Transportation (Non-Medical): Not on file  Physical Activity:   . Days of Exercise per Week: Not on file  . Minutes of Exercise per Session: Not on file  Stress:   . Feeling of Stress : Not on file  Social Connections:   . Frequency of Communication with Friends and Family: Not on file  . Frequency of Social Gatherings with Friends and Family: Not on file  . Attends Religious Services: Not on file  . Active Member of Clubs or Organizations: Not on file  . Attends Archivist Meetings: Not on file  . Marital Status: Not on file  Intimate Partner Violence:   . Fear of Current or Ex-Partner: Not on file  . Emotionally Abused: Not on file  . Physically Abused: Not on file  . Sexually Abused: Not on file    FAMILY HISTORY: Family History  Problem Relation Age of Onset  . Cancer Maternal Uncle     ALLERGIES:  has No Known Allergies.  MEDICATIONS:  Current Outpatient Medications  Medication Sig Dispense Refill  . acetaminophen (TYLENOL) 650 MG CR tablet Take 650 mg by mouth every 8 (eight) hours as needed for pain.    Marland Kitchen allopurinol (ZYLOPRIM) 300 MG tablet Take 300 mg by mouth at bedtime.     Marland Kitchen apixaban (ELIQUIS) 5 MG TABS tablet Take 5 mg by mouth 2 (two) times daily.     . blood glucose meter kit and supplies Use 1 kit as needed.    .  ferrous sulfate 325 (65 FE) MG EC tablet Take 325 mg by mouth 3 (three) times daily with meals. Patient reports takes once a day    . lisinopril (ZESTRIL) 40 MG tablet Take 40 mg by mouth at bedtime.     . metFORMIN (GLUCOPHAGE) 500 MG tablet Take 500 mg by mouth 2 (two) times daily with a meal.     . Multiple Vitamins-Minerals (CENTRUM SILVER PO) Take 1 tablet by mouth daily.    . simvastatin (ZOCOR) 80 MG tablet Take 40 mg by mouth daily at 6 PM.      No current facility-administered medications for this visit.      PHYSICAL EXAMINATION:   Vitals:   09/22/20 0949  BP: 131/66  Pulse: (!) 55  Resp: 16  Temp: 98.3 F (36.8 C)  SpO2: 99%   Filed Weights   09/22/20 0949  Weight: 218 lb (98.9 kg)    Physical Exam HENT:     Head: Normocephalic and atraumatic.  Mouth/Throat:     Pharynx: No oropharyngeal exudate.  Eyes:     Pupils: Pupils are equal, round, and reactive to light.  Cardiovascular:     Rate and Rhythm: Normal rate and regular rhythm.  Pulmonary:     Effort: Pulmonary effort is normal. No respiratory distress.     Breath sounds: Normal breath sounds. No wheezing.  Abdominal:     General: Bowel sounds are normal. There is no distension.     Palpations: Abdomen is soft. There is no mass.     Tenderness: There is no abdominal tenderness. There is no guarding or rebound.  Musculoskeletal:        General: No tenderness. Normal range of motion.     Cervical back: Normal range of motion and neck supple.  Skin:    General: Skin is warm.  Neurological:     Mental Status: He is alert and oriented to person, place, and time.  Psychiatric:        Mood and Affect: Affect normal.    LABORATORY DATA:  I have reviewed the data as listed Lab Results  Component Value Date   WBC 6.0 09/22/2020   HGB 8.4 (L) 09/22/2020   HCT 25.8 (L) 09/22/2020   MCV 104.9 (H) 09/22/2020   PLT 203 09/22/2020   Recent Labs    01/13/20 1250 01/13/20 1250 04/21/20 0924  04/21/20 0924 06/02/20 0901 07/28/20 0938 09/22/20 0937  NA 140   < > 139   < > 139 139 140  K 5.0   < > 4.8   < > 4.9 4.6 4.9  CL 110   < > 110   < > 108 107 110  CO2 21*   < > 23   < > 22 21* 23  GLUCOSE 122*   < > 154*   < > 169* 156* 178*  BUN 29*   < > 36*   < > 29* 28* 30*  CREATININE 1.22   < > 1.47*   < > 1.38* 1.26* 1.38*  CALCIUM 9.5   < > 9.4   < > 9.4 8.6* 9.2  GFRNONAA 58*   < > 47*   < > 50* 56* 54*  GFRAA >60   < > 54*  --  58* >60  --   PROT 7.5  --   --   --   --   --   --   ALBUMIN 4.5  --   --   --   --   --   --   AST 18  --   --   --   --   --   --   ALT 14  --   --   --   --   --   --   ALKPHOS 87  --   --   --   --   --   --   BILITOT 2.8*  --   --   --   --   --   --    < > = values in this interval not displayed.     No results found.  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  Patient currently on Retacrit [since April 2021]; hemoglobin overall stable between 7.8-8.  #Continues to be fairly asymptomatic [except for his wife that he is "nods off to sleep"].  Continue p.o. iron at this time.  #I discussed at length regarding use  of Luspatercept especially given SF 3 B1 mutation.  I discussed the mechanism of action/targeted therapy helps with maturation process of the blood cells.  Again reviewed the potential side effects including but not limited to fatigue; arthritis and nausea vomiting diarrhea.  In general the potential effects are quite mild; and should be well-tolerated.  The response rates in order of 40%.  After lengthy discussion patient/and his wife want to monitor closely over the next few weeks.however if no significant benefit noted with Retacrit/or patient gets more symptomatic-we will switch to Luspatercept.  # Stage kidney disease-stage III- STABLE.  Again reviewed the renal function.  Discussed importance of avoiding nephrotoxic agents.    # DISPOSITION:  # Retacrit today # in 2 week- lab- H&H; possible Retacrit # in 4 weeks- lab- H&H; possible Retacrit # # in 6 weeks- MD; labs-cbc/bmp/ retacrit;-Dr.B  # follow up in 8 weeks-   All questions were answered. The patient knows to call the clinic with any problems, questions or concerns.    Cammie Sickle, MD 09/23/2020 12:20 PM

## 2020-10-06 ENCOUNTER — Other Ambulatory Visit: Payer: Self-pay

## 2020-10-06 ENCOUNTER — Inpatient Hospital Stay: Payer: Medicare Other

## 2020-10-06 ENCOUNTER — Inpatient Hospital Stay: Payer: Medicare Other | Attending: Internal Medicine

## 2020-10-06 VITALS — BP 137/71 | HR 55

## 2020-10-06 DIAGNOSIS — D4620 Refractory anemia with excess of blasts, unspecified: Secondary | ICD-10-CM | POA: Diagnosis not present

## 2020-10-06 DIAGNOSIS — D539 Nutritional anemia, unspecified: Secondary | ICD-10-CM

## 2020-10-06 LAB — BASIC METABOLIC PANEL
Anion gap: 8 (ref 5–15)
BUN: 24 mg/dL — ABNORMAL HIGH (ref 8–23)
CO2: 23 mmol/L (ref 22–32)
Calcium: 9.4 mg/dL (ref 8.9–10.3)
Chloride: 108 mmol/L (ref 98–111)
Creatinine, Ser: 1.31 mg/dL — ABNORMAL HIGH (ref 0.61–1.24)
GFR, Estimated: 57 mL/min — ABNORMAL LOW (ref >60)
Glucose, Bld: 146 mg/dL — ABNORMAL HIGH (ref 70–99)
Potassium: 4.9 mmol/L (ref 3.5–5.1)
Sodium: 139 mmol/L (ref 135–145)

## 2020-10-06 LAB — CBC WITH DIFFERENTIAL/PLATELET
Abs Immature Granulocytes: 0.02 10*3/uL (ref 0.00–0.07)
Basophils Absolute: 0 10*3/uL (ref 0.0–0.1)
Basophils Relative: 0 %
Eosinophils Absolute: 0.1 10*3/uL (ref 0.0–0.5)
Eosinophils Relative: 3 %
HCT: 25.3 % — ABNORMAL LOW (ref 39.0–52.0)
Hemoglobin: 8.2 g/dL — ABNORMAL LOW (ref 13.0–17.0)
Immature Granulocytes: 0 %
Lymphocytes Relative: 27 %
Lymphs Abs: 1.3 10*3/uL (ref 0.7–4.0)
MCH: 33.2 pg (ref 26.0–34.0)
MCHC: 32.4 g/dL (ref 30.0–36.0)
MCV: 102.4 fL — ABNORMAL HIGH (ref 80.0–100.0)
Monocytes Absolute: 0.3 10*3/uL (ref 0.1–1.0)
Monocytes Relative: 7 %
Neutro Abs: 2.9 10*3/uL (ref 1.7–7.7)
Neutrophils Relative %: 63 %
Platelets: 229 10*3/uL (ref 150–400)
RBC: 2.47 MIL/uL — ABNORMAL LOW (ref 4.22–5.81)
RDW: 25.2 % — ABNORMAL HIGH (ref 11.5–15.5)
WBC: 4.7 10*3/uL (ref 4.0–10.5)
nRBC: 0 % (ref 0.0–0.2)

## 2020-10-06 MED ORDER — EPOETIN ALFA-EPBX 10000 UNIT/ML IJ SOLN
20000.0000 [IU] | Freq: Once | INTRAMUSCULAR | Status: AC
  Administered 2020-10-06: 20000 [IU] via SUBCUTANEOUS
  Filled 2020-10-06: qty 2

## 2020-10-19 ENCOUNTER — Other Ambulatory Visit: Payer: Self-pay | Admitting: *Deleted

## 2020-10-19 ENCOUNTER — Inpatient Hospital Stay: Payer: Medicare Other

## 2020-10-19 ENCOUNTER — Other Ambulatory Visit: Payer: Self-pay

## 2020-10-19 VITALS — BP 132/70 | HR 64

## 2020-10-19 DIAGNOSIS — D4620 Refractory anemia with excess of blasts, unspecified: Secondary | ICD-10-CM | POA: Diagnosis not present

## 2020-10-19 DIAGNOSIS — D539 Nutritional anemia, unspecified: Secondary | ICD-10-CM

## 2020-10-19 LAB — HEMOGLOBIN AND HEMATOCRIT, BLOOD
HCT: 26.6 % — ABNORMAL LOW (ref 39.0–52.0)
Hemoglobin: 8.6 g/dL — ABNORMAL LOW (ref 13.0–17.0)

## 2020-10-19 MED ORDER — EPOETIN ALFA-EPBX 10000 UNIT/ML IJ SOLN
20000.0000 [IU] | Freq: Once | INTRAMUSCULAR | Status: AC
  Administered 2020-10-19: 20000 [IU] via SUBCUTANEOUS
  Filled 2020-10-19: qty 2

## 2020-11-03 ENCOUNTER — Other Ambulatory Visit: Payer: Self-pay

## 2020-11-03 ENCOUNTER — Inpatient Hospital Stay: Payer: Medicare Other | Admitting: Internal Medicine

## 2020-11-03 ENCOUNTER — Inpatient Hospital Stay: Payer: Medicare Other | Attending: Internal Medicine

## 2020-11-03 ENCOUNTER — Inpatient Hospital Stay: Payer: Medicare Other

## 2020-11-03 DIAGNOSIS — D4620 Refractory anemia with excess of blasts, unspecified: Secondary | ICD-10-CM

## 2020-11-03 DIAGNOSIS — D461 Refractory anemia with ring sideroblasts: Secondary | ICD-10-CM | POA: Insufficient documentation

## 2020-11-03 DIAGNOSIS — D539 Nutritional anemia, unspecified: Secondary | ICD-10-CM

## 2020-11-03 LAB — CBC WITH DIFFERENTIAL/PLATELET
Abs Immature Granulocytes: 0.03 10*3/uL (ref 0.00–0.07)
Basophils Absolute: 0 10*3/uL (ref 0.0–0.1)
Basophils Relative: 1 %
Eosinophils Absolute: 0.1 10*3/uL (ref 0.0–0.5)
Eosinophils Relative: 2 %
HCT: 28 % — ABNORMAL LOW (ref 39.0–52.0)
Hemoglobin: 9.3 g/dL — ABNORMAL LOW (ref 13.0–17.0)
Immature Granulocytes: 1 %
Lymphocytes Relative: 28 %
Lymphs Abs: 1.6 10*3/uL (ref 0.7–4.0)
MCH: 33.1 pg (ref 26.0–34.0)
MCHC: 33.2 g/dL (ref 30.0–36.0)
MCV: 99.6 fL (ref 80.0–100.0)
Monocytes Absolute: 0.4 10*3/uL (ref 0.1–1.0)
Monocytes Relative: 8 %
Neutro Abs: 3.3 10*3/uL (ref 1.7–7.7)
Neutrophils Relative %: 60 %
Platelets: 212 10*3/uL (ref 150–400)
RBC: 2.81 MIL/uL — ABNORMAL LOW (ref 4.22–5.81)
RDW: 27.3 % — ABNORMAL HIGH (ref 11.5–15.5)
WBC: 5.5 10*3/uL (ref 4.0–10.5)
nRBC: 0.7 % — ABNORMAL HIGH (ref 0.0–0.2)

## 2020-11-03 LAB — BASIC METABOLIC PANEL
Anion gap: 10 (ref 5–15)
BUN: 32 mg/dL — ABNORMAL HIGH (ref 8–23)
CO2: 22 mmol/L (ref 22–32)
Calcium: 9.5 mg/dL (ref 8.9–10.3)
Chloride: 106 mmol/L (ref 98–111)
Creatinine, Ser: 1.18 mg/dL (ref 0.61–1.24)
GFR, Estimated: >60 mL/min (ref >60)
Glucose, Bld: 143 mg/dL — ABNORMAL HIGH (ref 70–99)
Potassium: 4.4 mmol/L (ref 3.5–5.1)
Sodium: 138 mmol/L (ref 135–145)

## 2020-11-03 LAB — IRON AND TIBC
Iron: 134 ug/dL (ref 45–182)
Saturation Ratios: 40 % — ABNORMAL HIGH (ref 17.9–39.5)
TIBC: 339 ug/dL (ref 250–450)
UIBC: 205 ug/dL

## 2020-11-03 LAB — FERRITIN: Ferritin: 917 ng/mL — ABNORMAL HIGH (ref 24–336)

## 2020-11-03 MED ORDER — EPOETIN ALFA-EPBX 10000 UNIT/ML IJ SOLN
20000.0000 [IU] | Freq: Once | INTRAMUSCULAR | Status: AC
  Administered 2020-11-03: 20000 [IU] via SUBCUTANEOUS
  Filled 2020-11-03: qty 2

## 2020-11-03 NOTE — Progress Notes (Signed)
[place] NOTE  Patient Care Team: Baxter Hire, MD as PCP - General (Internal Medicine) Cammie Sickle, MD as Consulting Physician (Hematology and Oncology)  CHIEF COMPLAINTS/PURPOSE OF CONSULTATION: MDS  Oncology History Overview Note  # EGD-none; colonoscopy-never [Cologurad x2- NEG];FEB 2021- US- abdomen-no liver disease/ Mild splenic enlargement [460cc; kidney cysts]; Q76 folic acid/LDH haptoglobin normal.  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   # NGS/MOLECULAR TESTS:[facility] ON hem- May 2021- Cux-1; DNMT3A; SF3B**  # PALLIATIVE CARE EVALUATION: NA   DIAGNOSIS: MDS low risk   GOALS: Control  CURRENT/MOST RECENT THERAPY : Retacrit    MDS (myelodysplastic syndrome), low grade ([place])  02/18/2020 Initial Diagnosis   MDS (myelodysplastic syndrome), low grade ([facility])       HISTORY OF PRESENTING ILLNESS:  Vernon [name] 74 y.o.  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  Patient denies any worsening fatigue.  Denies any swelling in the legs.  No new shortness of breath or cough.  Patient has not needed blood transfusion.  Review of Systems  Constitutional: Positive for malaise/fatigue. Negative for chills, diaphoresis, fever and weight loss.  HENT: Negative for nosebleeds and sore throat.   Eyes: Negative for double vision.  Respiratory: Negative for cough, hemoptysis, sputum production, shortness of breath and wheezing.   Cardiovascular: Negative for chest pain, palpitations, orthopnea and leg swelling.  Gastrointestinal: Negative for abdominal pain, blood in stool, constipation,  diarrhea, heartburn, melena, nausea and vomiting.  Genitourinary: Negative for dysuria, frequency and urgency.  Musculoskeletal: Negative for back pain and joint pain.  Skin: Negative.  Negative for itching and rash.  Neurological: Negative for dizziness, tingling, focal weakness, weakness and headaches.  Endo/Heme/Allergies: Does not bruise/bleed easily.  Psychiatric/Behavioral: Negative for depression. The patient is not nervous/anxious and does not have insomnia.     MEDICAL HISTORY:  Past Medical History:  Diagnosis Date  . Anemia   . Arthritis   . Complication of anesthesia    hard time getting bp up after knee replacement  . Diabetes mellitus without complication ([place])   . GERD (gastroesophageal reflux disease)    occ tums prn  . History of hiatal hernia   . Hypertension     SURGICAL HISTORY: Past Surgical History:  Procedure Laterality Date  . CARPAL TUNNEL RELEASE  2012  . JOINT REPLACEMENT Right 2010  . SHOULDER ARTHROSCOPY WITH ROTATOR CUFF REPAIR AND OPEN BICEPS TENODESIS Right 11/09/2019   Procedure: RIGHT SHOULDER ARTHROSCOPY WITH SUBSCAPULARIS REPAIR, SUBACROMIAL DECOMPRESSION,MINI OPEN ROTATOR CUFF REPAIR;  Surgeon: Leim Fabry, MD;  Location: [facility] ORS;  Service: Orthopedics;  Laterality: Right;    SOCIAL HISTORY: Social History   Socioeconomic History  . Marital status: Married    Spouse name: Not on file  . Number of children: Not on file  . Years of education: Not on file  . Highest education level: Not on file  Occupational History  . Not on file  Tobacco Use  . Smoking status: Former Smoker    Packs/day: 0.50    Years: 8.00    Pack years: 4.00    Types: Cigarettes    Quit date: 07/21/1978    Years since quitting: 42.3  . Smokeless  tobacco: Never Used  Substance and Sexual Activity  . Alcohol use: Yes    Alcohol/week: 0.0 - 1.0 standard drinks    Comment: rare beer  . Drug use: Never  . Sexual activity: Not on file  Other Topics Concern  .  Not on file  Social History Narrative   Lives in [place]; with wife; quit smoking in early 72s; ocassional/ rare [may be 1 a month beer]. 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    Social Determinants of Health   Financial Resource Strain: Not on file  Food Insecurity: Not on file  Transportation Needs: Not on file  Physical Activity: Not on file  Stress: Not on file  Social Connections: Not on file  Intimate Partner Violence: Not on file    FAMILY HISTORY: Family History  Problem Relation Age of Onset  . Cancer Maternal Uncle     ALLERGIES:  has No Known Allergies.  MEDICATIONS:  Current Outpatient Medications  Medication Sig Dispense Refill  . acetaminophen (TYLENOL) 650 MG CR tablet Take 650 mg by mouth every 8 (eight) hours as needed for pain.    Marland Kitchen allopurinol (ZYLOPRIM) 300 MG tablet Take 300 mg by mouth at bedtime.     Marland Kitchen apixaban (ELIQUIS) 5 MG TABS tablet Take 5 mg by mouth 2 (two) times daily.     . blood glucose meter kit and supplies Use 1 kit as needed.    . ferrous sulfate 325 (65 FE) MG EC tablet Take 325 mg by mouth 3 (three) times daily with meals. Patient reports takes once a day    . lisinopril (ZESTRIL) 40 MG tablet Take 40 mg by mouth at bedtime.     . metFORMIN (GLUCOPHAGE) 500 MG tablet Take 500 mg by mouth 2 (two) times daily with a meal.     . Multiple Vitamins-Minerals (CENTRUM SILVER PO) Take 1 tablet by mouth daily.    . simvastatin (ZOCOR) 80 MG tablet Take 40 mg by mouth daily at 6 PM.      No current facility-administered medications for this visit.      PHYSICAL EXAMINATION:   Vitals:   11/03/20 1021  BP: 140/72  Pulse: (!) 46  Resp: 20  Temp: (!) 96.8 F (36 C)   Filed Weights   11/03/20 1019  Weight: 217 lb (98.4 kg)    Physical Exam HENT:     Head: Normocephalic and atraumatic.     Mouth/Throat:     Pharynx: No oropharyngeal exudate.  Eyes:     Pupils: Pupils are equal, round, and reactive to light.   Cardiovascular:     Rate and Rhythm: Normal rate and regular rhythm.  Pulmonary:     Effort: Pulmonary effort is normal. No respiratory distress.     Breath sounds: Normal breath sounds. No wheezing.  Abdominal:     General: Bowel sounds are normal. There is no distension.     Palpations: Abdomen is soft. There is no mass.     Tenderness: There is no abdominal tenderness. There is no guarding or rebound.  Musculoskeletal:        General: No tenderness. Normal range of motion.     Cervical back: Normal range of motion and neck supple.  Skin:    General: Skin is warm.  Neurological:     Mental Status: He is alert and oriented to person, place, and time.  Psychiatric:        Mood and Affect: Affect normal.    LABORATORY  DATA:  I have reviewed the data as listed Lab Results  Component Value Date   WBC 5.5 11/03/2020   HGB 9.3 (L) 11/03/2020   HCT 28.0 (L) 11/03/2020   MCV 99.6 11/03/2020   PLT 212 11/03/2020   Recent Labs    01/13/20 1250 04/21/20 0924 06/02/20 0901 07/28/20 0938 09/22/20 0937 10/06/20 1027 11/03/20 1008  NA 140 139 139 139 140 139 138  K 5.0 4.8 4.9 4.6 4.9 4.9 4.4  CL 110 110 108 107 110 108 106  CO2 21* 23 22 21* $Remo'23 23 22  'EJxvn$ GLUCOSE 122* 154* 169* 156* 178* 146* 143*  BUN 29* 36* 29* 28* 30* 24* 32*  CREATININE 1.22 1.47* 1.38* 1.26* 1.38* 1.31* 1.18  CALCIUM 9.5 9.4 9.4 8.6* 9.2 9.4 9.5  GFRNONAA 58* 47* 50* 56* 54* 57* >60  GFRAA >60 54* 58* >60  --   --   --   PROT 7.5  --   --   --   --   --   --   ALBUMIN 4.5  --   --   --   --   --   --   AST 18  --   --   --   --   --   --   ALT 14  --   --   --   --   --   --   ALKPHOS 87  --   --   --   --   --   --   BILITOT 2.8*  --   --   --   --   --   --      No results found.  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  Patient currently on Retacrit [since April 2021];  #Today hemoglobin  is 9.4.  Stable.  Continue Aranesp for now.  Plan Luspatercept/Revlimid for second line therapies.  # Stage kidney disease-stage III-stable  # DISPOSITION:  # Retacrit today # in 2 week- lab- H&H; possible Retacrit # in 4 weeks- lab- H&H; possible Retacrit # in 6 weeks- lab- H&H; possible Retacrit # # in 8 weeks- MD; labs-cbc/bmp/ retacrit;-Dr.B  All questions were answered. The patient knows to call the clinic with any problems, questions or concerns.    Cammie Sickle, MD 11/04/2020 10:13 PM

## 2020-11-03 NOTE — Assessment & Plan Note (Addendum)
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  Patient currently on Retacrit [since April 2021];  #Today hemoglobin is 9.4.  Stable.  Continue Aranesp for now.  Plan Luspatercept/Revlimid for second line therapies.  # Stage kidney disease-stage III-stable  # DISPOSITION:  # Retacrit today # in 2 week- lab- H&H; possible Retacrit # in 4 weeks- lab- H&H; possible Retacrit # in 6 weeks- lab- H&H; possible Retacrit # # in 8 weeks- MD; labs-cbc/bmp/ retacrit;-Dr.B

## 2020-11-17 ENCOUNTER — Inpatient Hospital Stay: Payer: Medicare Other

## 2020-11-17 VITALS — BP 113/60 | HR 56

## 2020-11-17 DIAGNOSIS — D4620 Refractory anemia with excess of blasts, unspecified: Secondary | ICD-10-CM

## 2020-11-17 DIAGNOSIS — D461 Refractory anemia with ring sideroblasts: Secondary | ICD-10-CM | POA: Diagnosis not present

## 2020-11-17 LAB — HEMOGLOBIN AND HEMATOCRIT, BLOOD
HCT: 27.6 % — ABNORMAL LOW (ref 39.0–52.0)
Hemoglobin: 9 g/dL — ABNORMAL LOW (ref 13.0–17.0)

## 2020-11-17 MED ORDER — EPOETIN ALFA-EPBX 10000 UNIT/ML IJ SOLN
20000.0000 [IU] | Freq: Once | INTRAMUSCULAR | Status: AC
  Administered 2020-11-17: 20000 [IU] via SUBCUTANEOUS
  Filled 2020-11-17: qty 2

## 2020-12-01 ENCOUNTER — Inpatient Hospital Stay: Payer: Medicare Other

## 2020-12-01 ENCOUNTER — Inpatient Hospital Stay: Payer: Medicare Other | Attending: Internal Medicine

## 2020-12-01 VITALS — BP 131/67 | HR 54

## 2020-12-01 DIAGNOSIS — D4620 Refractory anemia with excess of blasts, unspecified: Secondary | ICD-10-CM

## 2020-12-01 DIAGNOSIS — N183 Chronic kidney disease, stage 3 unspecified: Secondary | ICD-10-CM | POA: Diagnosis not present

## 2020-12-01 LAB — HEMOGLOBIN AND HEMATOCRIT, BLOOD
HCT: 27.2 % — ABNORMAL LOW (ref 39.0–52.0)
Hemoglobin: 9.1 g/dL — ABNORMAL LOW (ref 13.0–17.0)

## 2020-12-01 MED ORDER — EPOETIN ALFA-EPBX 10000 UNIT/ML IJ SOLN
20000.0000 [IU] | Freq: Once | INTRAMUSCULAR | Status: AC
  Administered 2020-12-01: 20000 [IU] via SUBCUTANEOUS
  Filled 2020-12-01: qty 2

## 2020-12-15 ENCOUNTER — Inpatient Hospital Stay: Payer: Medicare Other

## 2020-12-15 VITALS — BP 123/67 | HR 54

## 2020-12-15 DIAGNOSIS — D4620 Refractory anemia with excess of blasts, unspecified: Secondary | ICD-10-CM

## 2020-12-15 LAB — HEMOGLOBIN AND HEMATOCRIT, BLOOD
HCT: 28.1 % — ABNORMAL LOW (ref 39.0–52.0)
Hemoglobin: 9.3 g/dL — ABNORMAL LOW (ref 13.0–17.0)

## 2020-12-15 MED ORDER — EPOETIN ALFA-EPBX 10000 UNIT/ML IJ SOLN
20000.0000 [IU] | Freq: Once | INTRAMUSCULAR | Status: AC
  Administered 2020-12-15: 20000 [IU] via SUBCUTANEOUS
  Filled 2020-12-15: qty 2

## 2020-12-29 ENCOUNTER — Inpatient Hospital Stay: Payer: Medicare Other

## 2020-12-29 ENCOUNTER — Inpatient Hospital Stay: Payer: Medicare Other | Attending: Internal Medicine

## 2020-12-29 ENCOUNTER — Other Ambulatory Visit: Payer: Self-pay

## 2020-12-29 ENCOUNTER — Inpatient Hospital Stay: Payer: Medicare Other | Admitting: Internal Medicine

## 2020-12-29 ENCOUNTER — Encounter: Payer: Self-pay | Admitting: Internal Medicine

## 2020-12-29 VITALS — BP 128/62 | HR 55 | Temp 97.8°F | Resp 16 | Ht 69.0 in | Wt 220.8 lb

## 2020-12-29 DIAGNOSIS — D461 Refractory anemia with ring sideroblasts: Secondary | ICD-10-CM | POA: Diagnosis present

## 2020-12-29 DIAGNOSIS — D4620 Refractory anemia with excess of blasts, unspecified: Secondary | ICD-10-CM

## 2020-12-29 DIAGNOSIS — D539 Nutritional anemia, unspecified: Secondary | ICD-10-CM

## 2020-12-29 DIAGNOSIS — N183 Chronic kidney disease, stage 3 unspecified: Secondary | ICD-10-CM | POA: Diagnosis not present

## 2020-12-29 DIAGNOSIS — Z79899 Other long term (current) drug therapy: Secondary | ICD-10-CM | POA: Diagnosis not present

## 2020-12-29 LAB — FERRITIN: Ferritin: 912 ng/mL — ABNORMAL HIGH (ref 24–336)

## 2020-12-29 LAB — IRON AND TIBC
Iron: 65 ug/dL (ref 45–182)
Saturation Ratios: 21 % (ref 17.9–39.5)
TIBC: 318 ug/dL (ref 250–450)
UIBC: 253 ug/dL

## 2020-12-29 LAB — COMPREHENSIVE METABOLIC PANEL
ALT: 21 U/L (ref 0–44)
AST: 24 U/L (ref 15–41)
Albumin: 4.3 g/dL (ref 3.5–5.0)
Alkaline Phosphatase: 92 U/L (ref 38–126)
Anion gap: 8 (ref 5–15)
BUN: 32 mg/dL — ABNORMAL HIGH (ref 8–23)
CO2: 22 mmol/L (ref 22–32)
Calcium: 9.3 mg/dL (ref 8.9–10.3)
Chloride: 111 mmol/L (ref 98–111)
Creatinine, Ser: 1.27 mg/dL — ABNORMAL HIGH (ref 0.61–1.24)
GFR, Estimated: 59 mL/min — ABNORMAL LOW (ref >60)
Glucose, Bld: 149 mg/dL — ABNORMAL HIGH (ref 70–99)
Potassium: 4.9 mmol/L (ref 3.5–5.1)
Sodium: 141 mmol/L (ref 135–145)
Total Bilirubin: 2.5 mg/dL — ABNORMAL HIGH (ref 0.3–1.2)
Total Protein: 7.3 g/dL (ref 6.5–8.1)

## 2020-12-29 LAB — CBC WITH DIFFERENTIAL/PLATELET
Abs Immature Granulocytes: 0.04 10*3/uL (ref 0.00–0.07)
Basophils Absolute: 0 10*3/uL (ref 0.0–0.1)
Basophils Relative: 1 %
Eosinophils Absolute: 0.1 10*3/uL (ref 0.0–0.5)
Eosinophils Relative: 2 %
HCT: 25.2 % — ABNORMAL LOW (ref 39.0–52.0)
Hemoglobin: 8.4 g/dL — ABNORMAL LOW (ref 13.0–17.0)
Immature Granulocytes: 1 %
Lymphocytes Relative: 21 %
Lymphs Abs: 1.3 10*3/uL (ref 0.7–4.0)
MCH: 32.7 pg (ref 26.0–34.0)
MCHC: 33.3 g/dL (ref 30.0–36.0)
MCV: 98.1 fL (ref 80.0–100.0)
Monocytes Absolute: 0.5 10*3/uL (ref 0.1–1.0)
Monocytes Relative: 8 %
Neutro Abs: 4.1 10*3/uL (ref 1.7–7.7)
Neutrophils Relative %: 67 %
Platelets: 188 10*3/uL (ref 150–400)
RBC: 2.57 MIL/uL — ABNORMAL LOW (ref 4.22–5.81)
RDW: 27.9 % — ABNORMAL HIGH (ref 11.5–15.5)
WBC: 6 10*3/uL (ref 4.0–10.5)
nRBC: 0.3 % — ABNORMAL HIGH (ref 0.0–0.2)

## 2020-12-29 MED ORDER — EPOETIN ALFA-EPBX 10000 UNIT/ML IJ SOLN
20000.0000 [IU] | Freq: Once | INTRAMUSCULAR | Status: AC
  Administered 2020-12-29: 20000 [IU] via SUBCUTANEOUS
  Filled 2020-12-29: qty 2

## 2020-12-29 NOTE — Progress Notes (Signed)
[place] NOTE  Patient Care Team: Baxter Hire, MD as PCP - General (Internal Medicine) Cammie Sickle, MD as Consulting Physician (Hematology and Oncology)  CHIEF COMPLAINTS/PURPOSE OF CONSULTATION: MDS  Oncology History Overview Note  # EGD-none; colonoscopy-never [Cologurad x2- NEG];FEB 2021- US- abdomen-no liver disease/ Mild splenic enlargement [460cc; kidney cysts]; T24 folic acid/LDH haptoglobin normal.  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   # NGS/MOLECULAR TESTS:[facility] ON hem- May 2021- Cux-1; DNMT3A; SF3B**  # PALLIATIVE CARE EVALUATION: NA   DIAGNOSIS: MDS low risk   GOALS: Control  CURRENT/MOST RECENT THERAPY : Retacrit    MDS (myelodysplastic syndrome), low grade ([place])  02/18/2020 Initial Diagnosis   MDS (myelodysplastic syndrome), low grade ([facility])       HISTORY OF PRESENTING ILLNESS:  Vernon [name] 75 y.o.  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  Patient denies any worsening fatigue.  Denies any swelling in the legs.  Denies any new shortness of breath or cough.  Patient has not needed blood transfusions.  No headaches.  Review of Systems  Constitutional: Positive for malaise/fatigue. Negative for chills, diaphoresis, fever and weight loss.  HENT: Negative for nosebleeds and sore throat.   Eyes: Negative for double vision.  Respiratory: Negative for cough, hemoptysis, sputum production, shortness of breath and wheezing.   Cardiovascular: Negative for chest pain, palpitations, orthopnea and leg swelling.  Gastrointestinal: Negative for abdominal pain, blood in  stool, constipation, diarrhea, heartburn, melena, nausea and vomiting.  Genitourinary: Negative for dysuria, frequency and urgency.  Musculoskeletal: Negative for back pain and joint pain.  Skin: Negative.  Negative for itching and rash.  Neurological: Negative for dizziness, tingling, focal weakness, weakness and headaches.  Endo/Heme/Allergies: Does not bruise/bleed easily.  Psychiatric/Behavioral: Negative for depression. The patient is not nervous/anxious and does not have insomnia.     MEDICAL HISTORY:  Past Medical History:  Diagnosis Date  . Anemia   . Arthritis   . Complication of anesthesia    hard time getting bp up after knee replacement  . Diabetes mellitus without complication ([place])   . GERD (gastroesophageal reflux disease)    occ tums prn  . History of hiatal hernia   . Hypertension     SURGICAL HISTORY: Past Surgical History:  Procedure Laterality Date  . CARPAL TUNNEL RELEASE  2012  . JOINT REPLACEMENT Right 2010  . SHOULDER ARTHROSCOPY WITH ROTATOR CUFF REPAIR AND OPEN BICEPS TENODESIS Right 11/09/2019   Procedure: RIGHT SHOULDER ARTHROSCOPY WITH SUBSCAPULARIS REPAIR, SUBACROMIAL DECOMPRESSION,MINI OPEN ROTATOR CUFF REPAIR;  Surgeon: Leim Fabry, MD;  Location: [facility] ORS;  Service: Orthopedics;  Laterality: Right;    SOCIAL HISTORY: Social History   Socioeconomic History  . Marital status: Married    Spouse name: Not on file  . Number of children: Not on file  . Years of education: Not on file  . Highest education level: Not on file  Occupational History  . Not on file  Tobacco Use  . Smoking status: Former Smoker    Packs/day: 0.50    Years: 8.00    Pack years: 4.00    Types: Cigarettes    Quit date: 07/21/1978    Years since quitting:  42.4  . Smokeless tobacco: Never Used  Substance and Sexual Activity  . Alcohol use: Yes    Alcohol/week: 0.0 - 1.0 standard drinks    Comment: rare beer  . Drug use: Never  . Sexual activity: Not on file   Other Topics Concern  . Not on file  Social History Narrative   Lives in [place]; with wife; quit smoking in early 2s; ocassional/ rare [may be 1 a month beer]. 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    Social Determinants of Health   Financial Resource Strain: Not on file  Food Insecurity: Not on file  Transportation Needs: Not on file  Physical Activity: Not on file  Stress: Not on file  Social Connections: Not on file  Intimate Partner Violence: Not on file    FAMILY HISTORY: Family History  Problem Relation Age of Onset  . Cancer Maternal Uncle     ALLERGIES:  has No Known Allergies.  MEDICATIONS:  Current Outpatient Medications  Medication Sig Dispense Refill  . acetaminophen (TYLENOL) 650 MG CR tablet Take 650 mg by mouth every 8 (eight) hours as needed for pain.    Marland Kitchen allopurinol (ZYLOPRIM) 300 MG tablet Take 300 mg by mouth at bedtime.     Marland Kitchen apixaban (ELIQUIS) 5 MG TABS tablet Take 5 mg by mouth 2 (two) times daily.     . blood glucose meter kit and supplies Use 1 kit as needed.    . ferrous sulfate 325 (65 FE) MG EC tablet Take 325 mg by mouth 3 (three) times daily with meals. Patient reports takes once a day    . lisinopril (ZESTRIL) 40 MG tablet Take 40 mg by mouth at bedtime.     . metFORMIN (GLUCOPHAGE) 500 MG tablet Take 500 mg by mouth 2 (two) times daily with a meal.     . Multiple Vitamins-Minerals (CENTRUM SILVER PO) Take 1 tablet by mouth daily.    . simvastatin (ZOCOR) 80 MG tablet Take 40 mg by mouth daily at 6 PM.      No current facility-administered medications for this visit.   Facility-Administered Medications Ordered in Other Visits  Medication Dose Route Frequency Provider Last Rate Last Admin  . epoetin alfa-epbx (RETACRIT) injection 20,000 Units  20,000 Units Subcutaneous Once Charlaine Dalton R, MD          PHYSICAL EXAMINATION:   Vitals:   12/29/20 0946  BP: 128/62  Pulse: (!) 55  Resp: 16  Temp: 97.8 F (36.6 C)   SpO2: 99%   Filed Weights   12/29/20 0946  Weight: 220 lb 12.8 oz (100.2 kg)    Physical Exam HENT:     Head: Normocephalic and atraumatic.     Mouth/Throat:     Pharynx: No oropharyngeal exudate.  Eyes:     Pupils: Pupils are equal, round, and reactive to light.  Cardiovascular:     Rate and Rhythm: Normal rate and regular rhythm.  Pulmonary:     Effort: Pulmonary effort is normal. No respiratory distress.     Breath sounds: Normal breath sounds. No wheezing.  Abdominal:     General: Bowel sounds are normal. There is no distension.     Palpations: Abdomen is soft. There is no mass.     Tenderness: There is no abdominal tenderness. There is no guarding or rebound.  Musculoskeletal:        General: No tenderness. Normal range of motion.     Cervical back: Normal range of motion and  neck supple.  Skin:    General: Skin is warm.  Neurological:     Mental Status: He is alert and oriented to person, place, and time.  Psychiatric:        Mood and Affect: Affect normal.    LABORATORY DATA:  I have reviewed the data as listed Lab Results  Component Value Date   WBC 6.0 12/29/2020   HGB 8.4 (L) 12/29/2020   HCT 25.2 (L) 12/29/2020   MCV 98.1 12/29/2020   PLT 188 12/29/2020   Recent Labs    01/13/20 1250 04/21/20 0924 06/02/20 0901 07/28/20 0938 09/22/20 0937 10/06/20 1027 11/03/20 1008 12/29/20 0930  NA 140 139 139 139   < > 139 138 141  K 5.0 4.8 4.9 4.6   < > 4.9 4.4 4.9  CL 110 110 108 107   < > 108 106 111  CO2 21* 23 22 21*   < > _0 GLUCOSE 122* 154* 169* 156*   < > 146* 143* 149*  BUN 29* 36* 29* 28*   < > 24* 32* 32*  CREATININE 1.22 1.47* 1.38* 1.26*   < > 1.31* 1.18 1.27*  CALCIUM 9.5 9.4 9.4 8.6*   < > 9.4 9.5 9.3  GFRNONAA 58* 47* 50* 56*   < > 57* >60 59*  GFRAA >60 54* 58* >60  --   --   --   --   PROT 7.5  --   --   --   --   --   --  7.3  ALBUMIN 4.5  --   --   --   --   --   --  4.3  AST 18  --   --   --   --   --   --  24  ALT 14  --    --   --   --   --   --  21  ALKPHOS 87  --   --   --   --   --   --  92  BILITOT 2.8*  --   --   --   --   --   --  2.5*   < > = values in this interval not displayed.     No results found.  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  Patient currently on Retacrit [since April 2021]; hemoglobin between 8-9.  Patient is fairly asymptomatic.  Discussed with the patient that if he gets more fatigue/symptomatic the options include Luspatercept/Revlimid.  Patient reaffirms that he is not feeling any worse at this time.  #Today hemoglobin is 8.4.  STABLE.  Continue Aranesp for now.  Check iron studies.  # Stage kidney disease-stage III-STABLE.   # DISPOSITION:  # Retacrit today # in 2 week- lab- H&H; possible Retacrit # in 4 weeks- lab- H&H; possible Retacrit # in 6 weeks- lab- H&H; possible Retacrit # # in 8 weeks- MD; labs-cbc/bmp/ retacrit;-Dr.B  All questions were answered. The patient knows to call the clinic with any problems, questions or concerns.    Cammie Sickle, MD 12/29/2020 10:32 AM

## 2020-12-29 NOTE — Assessment & Plan Note (Addendum)
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  Patient currently on Retacrit [since April 2021]; hemoglobin between 8-9.  Patient is fairly asymptomatic.  Discussed with the patient that if he gets more fatigue/symptomatic the options include Luspatercept/Revlimid.  Patient reaffirms that he is not feeling any worse at this time.  #Today hemoglobin is 8.4.  STABLE.  Continue Aranesp for now.  Check iron studies.  # Stage kidney disease-stage III-STABLE.   # DISPOSITION:  # Retacrit today # in 2 week- lab- H&H; possible Retacrit # in 4 weeks- lab- H&H; possible Retacrit # in 6 weeks- lab- H&H; possible Retacrit # # in 8 weeks- MD; labs-cbc/bmp/ retacrit;-Dr.B

## 2021-01-12 ENCOUNTER — Inpatient Hospital Stay: Payer: Medicare Other

## 2021-01-12 VITALS — BP 139/73 | HR 62

## 2021-01-12 DIAGNOSIS — D46Z Other myelodysplastic syndromes: Secondary | ICD-10-CM

## 2021-01-12 DIAGNOSIS — D4620 Refractory anemia with excess of blasts, unspecified: Secondary | ICD-10-CM

## 2021-01-12 DIAGNOSIS — D539 Nutritional anemia, unspecified: Secondary | ICD-10-CM

## 2021-01-12 DIAGNOSIS — D461 Refractory anemia with ring sideroblasts: Secondary | ICD-10-CM | POA: Diagnosis not present

## 2021-01-12 LAB — HEMOGLOBIN AND HEMATOCRIT, BLOOD
HCT: 25.1 % — ABNORMAL LOW (ref 39.0–52.0)
Hemoglobin: 8.3 g/dL — ABNORMAL LOW (ref 13.0–17.0)

## 2021-01-12 MED ORDER — EPOETIN ALFA-EPBX 10000 UNIT/ML IJ SOLN
20000.0000 [IU] | Freq: Once | INTRAMUSCULAR | Status: AC
  Administered 2021-01-12: 20000 [IU] via SUBCUTANEOUS
  Filled 2021-01-12: qty 2

## 2021-01-26 ENCOUNTER — Inpatient Hospital Stay: Payer: Medicare Other

## 2021-01-26 ENCOUNTER — Inpatient Hospital Stay: Payer: Medicare Other | Attending: Internal Medicine

## 2021-01-26 ENCOUNTER — Other Ambulatory Visit: Payer: Self-pay

## 2021-01-26 VITALS — BP 132/74 | HR 62

## 2021-01-26 DIAGNOSIS — D4620 Refractory anemia with excess of blasts, unspecified: Secondary | ICD-10-CM

## 2021-01-26 DIAGNOSIS — D539 Nutritional anemia, unspecified: Secondary | ICD-10-CM

## 2021-01-26 DIAGNOSIS — D461 Refractory anemia with ring sideroblasts: Secondary | ICD-10-CM | POA: Insufficient documentation

## 2021-01-26 LAB — HEMOGLOBIN AND HEMATOCRIT, BLOOD
HCT: 27.6 % — ABNORMAL LOW (ref 39.0–52.0)
Hemoglobin: 8.9 g/dL — ABNORMAL LOW (ref 13.0–17.0)

## 2021-01-26 MED ORDER — EPOETIN ALFA-EPBX 10000 UNIT/ML IJ SOLN
20000.0000 [IU] | Freq: Once | INTRAMUSCULAR | Status: AC
  Administered 2021-01-26: 20000 [IU] via SUBCUTANEOUS
  Filled 2021-01-26: qty 2

## 2021-02-09 ENCOUNTER — Inpatient Hospital Stay: Payer: Medicare Other

## 2021-02-09 VITALS — BP 134/76 | HR 47

## 2021-02-09 DIAGNOSIS — D4620 Refractory anemia with excess of blasts, unspecified: Secondary | ICD-10-CM

## 2021-02-09 DIAGNOSIS — D46Z Other myelodysplastic syndromes: Secondary | ICD-10-CM

## 2021-02-09 DIAGNOSIS — D539 Nutritional anemia, unspecified: Secondary | ICD-10-CM

## 2021-02-09 DIAGNOSIS — D461 Refractory anemia with ring sideroblasts: Secondary | ICD-10-CM | POA: Diagnosis not present

## 2021-02-09 LAB — HEMOGLOBIN AND HEMATOCRIT, BLOOD
HCT: 29.7 % — ABNORMAL LOW (ref 39.0–52.0)
Hemoglobin: 9.6 g/dL — ABNORMAL LOW (ref 13.0–17.0)

## 2021-02-09 MED ORDER — EPOETIN ALFA-EPBX 40000 UNIT/ML IJ SOLN
20000.0000 [IU] | Freq: Once | INTRAMUSCULAR | Status: DC
  Filled 2021-02-09: qty 1

## 2021-02-09 MED ORDER — EPOETIN ALFA-EPBX 10000 UNIT/ML IJ SOLN
20000.0000 [IU] | Freq: Once | INTRAMUSCULAR | Status: AC
  Administered 2021-02-09: 20000 [IU] via SUBCUTANEOUS

## 2021-02-17 ENCOUNTER — Other Ambulatory Visit: Payer: Self-pay

## 2021-02-17 DIAGNOSIS — D4620 Refractory anemia with excess of blasts, unspecified: Secondary | ICD-10-CM

## 2021-02-23 ENCOUNTER — Inpatient Hospital Stay: Payer: Medicare Other

## 2021-02-23 ENCOUNTER — Inpatient Hospital Stay: Payer: Medicare Other | Admitting: Internal Medicine

## 2021-02-23 ENCOUNTER — Other Ambulatory Visit: Payer: Self-pay

## 2021-02-23 DIAGNOSIS — D46Z Other myelodysplastic syndromes: Secondary | ICD-10-CM

## 2021-02-23 DIAGNOSIS — D461 Refractory anemia with ring sideroblasts: Secondary | ICD-10-CM | POA: Diagnosis not present

## 2021-02-23 DIAGNOSIS — D4620 Refractory anemia with excess of blasts, unspecified: Secondary | ICD-10-CM

## 2021-02-23 DIAGNOSIS — D539 Nutritional anemia, unspecified: Secondary | ICD-10-CM

## 2021-02-23 LAB — CBC WITH DIFFERENTIAL/PLATELET
Abs Immature Granulocytes: 0.02 10*3/uL (ref 0.00–0.07)
Basophils Absolute: 0 10*3/uL (ref 0.0–0.1)
Basophils Relative: 1 %
Eosinophils Absolute: 0.3 10*3/uL (ref 0.0–0.5)
Eosinophils Relative: 4 %
HCT: 28.7 % — ABNORMAL LOW (ref 39.0–52.0)
Hemoglobin: 9.3 g/dL — ABNORMAL LOW (ref 13.0–17.0)
Immature Granulocytes: 0 %
Lymphocytes Relative: 28 %
Lymphs Abs: 1.7 10*3/uL (ref 0.7–4.0)
MCH: 31 pg (ref 26.0–34.0)
MCHC: 32.4 g/dL (ref 30.0–36.0)
MCV: 95.7 fL (ref 80.0–100.0)
Monocytes Absolute: 0.5 10*3/uL (ref 0.1–1.0)
Monocytes Relative: 7 %
Neutro Abs: 3.8 10*3/uL (ref 1.7–7.7)
Neutrophils Relative %: 60 %
Platelets: 198 10*3/uL (ref 150–400)
RBC: 3 MIL/uL — ABNORMAL LOW (ref 4.22–5.81)
RDW: 28.5 % — ABNORMAL HIGH (ref 11.5–15.5)
WBC: 6.3 10*3/uL (ref 4.0–10.5)
nRBC: 0.5 % — ABNORMAL HIGH (ref 0.0–0.2)

## 2021-02-23 LAB — BASIC METABOLIC PANEL
Anion gap: 9 (ref 5–15)
BUN: 36 mg/dL — ABNORMAL HIGH (ref 8–23)
CO2: 22 mmol/L (ref 22–32)
Calcium: 9.4 mg/dL (ref 8.9–10.3)
Chloride: 108 mmol/L (ref 98–111)
Creatinine, Ser: 1.27 mg/dL — ABNORMAL HIGH (ref 0.61–1.24)
GFR, Estimated: 59 mL/min — ABNORMAL LOW (ref >60)
Glucose, Bld: 150 mg/dL — ABNORMAL HIGH (ref 70–99)
Potassium: 4.9 mmol/L (ref 3.5–5.1)
Sodium: 139 mmol/L (ref 135–145)

## 2021-02-23 MED ORDER — EPOETIN ALFA-EPBX 10000 UNIT/ML IJ SOLN
20000.0000 [IU] | Freq: Once | INTRAMUSCULAR | Status: AC
  Administered 2021-02-23: 20000 [IU] via SUBCUTANEOUS
  Filled 2021-02-23: qty 2

## 2021-02-23 NOTE — Progress Notes (Signed)
Pt in for follow up, denies any concerns today. 

## 2021-02-23 NOTE — Progress Notes (Signed)
[place] NOTE  Patient Care Team: Baxter Hire, MD as PCP - General (Internal Medicine) Cammie Sickle, MD as Consulting Physician (Hematology and Oncology)  CHIEF COMPLAINTS/PURPOSE OF CONSULTATION: MDS  Oncology History Overview Note  # EGD-none; colonoscopy-never [Cologurad x2- NEG];FEB 2021- US- abdomen-no liver disease/ Mild splenic enlargement [460cc; kidney cysts]; D74 folic acid/LDH haptoglobin normal.  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   # NGS/MOLECULAR TESTS:[facility] ON hem- May 2021- Cux-1; DNMT3A; SF3B**  # PALLIATIVE CARE EVALUATION: NA   DIAGNOSIS: MDS low risk   GOALS: Control  CURRENT/MOST RECENT THERAPY : Retacrit    MDS (myelodysplastic syndrome), low grade ([place])  02/18/2020 Initial Diagnosis   MDS (myelodysplastic syndrome), low grade ([facility])       HISTORY OF PRESENTING ILLNESS:  Vernon [name] 75 y.o.  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  Patient denies any worsening shortness of breath or cough he denies any swelling in the legs.  Not need any blood transfusions.  No headaches.  No falls.  No dizzy spells.  Review of Systems  Constitutional: Positive for malaise/fatigue. Negative for chills, diaphoresis, fever and weight loss.  HENT: Negative for nosebleeds and sore throat.   Eyes: Negative for double vision.  Respiratory: Negative for cough, hemoptysis, sputum production, shortness of breath and wheezing.   Cardiovascular: Negative for chest pain, palpitations, orthopnea and leg swelling.  Gastrointestinal: Negative for abdominal pain, blood in stool,  constipation, diarrhea, heartburn, melena, nausea and vomiting.  Genitourinary: Negative for dysuria, frequency and urgency.  Musculoskeletal: Negative for back pain and joint pain.  Skin: Negative.  Negative for itching and rash.  Neurological: Negative for dizziness, tingling, focal weakness, weakness and headaches.  Endo/Heme/Allergies: Does not bruise/bleed easily.  Psychiatric/Behavioral: Negative for depression. The patient is not nervous/anxious and does not have insomnia.     MEDICAL HISTORY:  Past Medical History:  Diagnosis Date  . Anemia   . Arthritis   . Complication of anesthesia    hard time getting bp up after knee replacement  . Diabetes mellitus without complication ([place])   . GERD (gastroesophageal reflux disease)    occ tums prn  . History of hiatal hernia   . Hypertension     SURGICAL HISTORY: Past Surgical History:  Procedure Laterality Date  . CARPAL TUNNEL RELEASE  2012  . JOINT REPLACEMENT Right 2010  . SHOULDER ARTHROSCOPY WITH ROTATOR CUFF REPAIR AND OPEN BICEPS TENODESIS Right 11/09/2019   Procedure: RIGHT SHOULDER ARTHROSCOPY WITH SUBSCAPULARIS REPAIR, SUBACROMIAL DECOMPRESSION,MINI OPEN ROTATOR CUFF REPAIR;  Surgeon: Leim Fabry, MD;  Location: [facility] ORS;  Service: Orthopedics;  Laterality: Right;    SOCIAL HISTORY: Social History   Socioeconomic History  . Marital status: Married    Spouse name: Not on file  . Number of children: Not on file  . Years of education: Not on file  . Highest education level: Not on file  Occupational History  . Not on file  Tobacco Use  . Smoking status: Former Smoker    Packs/day: 0.50    Years: 8.00    Pack years: 4.00    Types: Cigarettes    Quit date: 07/21/1978    Years since  quitting: 42.6  . Smokeless tobacco: Never Used  Substance and Sexual Activity  . Alcohol use: Yes    Alcohol/week: 0.0 - 1.0 standard drinks    Comment: rare beer  . Drug use: Never  . Sexual activity: Not on file  Other  Topics Concern  . Not on file  Social History Narrative   Lives in [place]; with wife; quit smoking in early 55s; ocassional/ rare [may be 1 a month beer]. 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    Social Determinants of Health   Financial Resource Strain: Not on file  Food Insecurity: Not on file  Transportation Needs: Not on file  Physical Activity: Not on file  Stress: Not on file  Social Connections: Not on file  Intimate Partner Violence: Not on file    FAMILY HISTORY: Family History  Problem Relation Age of Onset  . Cancer Maternal Uncle     ALLERGIES:  has No Known Allergies.  MEDICATIONS:  Current Outpatient Medications  Medication Sig Dispense Refill  . acetaminophen (TYLENOL) 650 MG CR tablet Take 650 mg by mouth every 8 (eight) hours as needed for pain.    Marland Kitchen allopurinol (ZYLOPRIM) 300 MG tablet Take 300 mg by mouth at bedtime.     Marland Kitchen apixaban (ELIQUIS) 5 MG TABS tablet Take 5 mg by mouth 2 (two) times daily.     . blood glucose meter kit and supplies Use 1 kit as needed.    . chlorhexidine (PERIDEX) 0.12 % solution PLEASE SEE ATTACHED FOR DETAILED DIRECTIONS    . Cyanocobalamin (VITAMIN B 12) 500 MCG TABS Take by mouth.    . ferrous sulfate 325 (65 FE) MG EC tablet Take 325 mg by mouth 2 (two) times daily. Patient reports takes once a day    . lisinopril (ZESTRIL) 40 MG tablet Take 40 mg by mouth at bedtime.     . metFORMIN (GLUCOPHAGE) 500 MG tablet Take 500 mg by mouth 2 (two) times daily with a meal.     . Multiple Vitamins-Minerals (CENTRUM SILVER PO) Take 1 tablet by mouth daily.    . simvastatin (ZOCOR) 80 MG tablet Take 40 mg by mouth daily at 6 PM.      No current facility-administered medications for this visit.   Facility-Administered Medications Ordered in Other Visits  Medication Dose Route Frequency Provider Last Rate Last Admin  . epoetin alfa-epbx (RETACRIT) injection 20,000 Units  20,000 Units Subcutaneous Once Charlaine Dalton R, MD           PHYSICAL EXAMINATION:   Vitals:   02/23/21 0953  BP: 126/63  Pulse: 60  Resp: 18  Temp: 97.9 F (36.6 C)  SpO2: 100%   Filed Weights   02/23/21 0953  Weight: 217 lb 12.8 oz (98.8 kg)    Physical Exam HENT:     Head: Normocephalic and atraumatic.     Mouth/Throat:     Pharynx: No oropharyngeal exudate.  Eyes:     Pupils: Pupils are equal, round, and reactive to light.  Cardiovascular:     Rate and Rhythm: Normal rate and regular rhythm.  Pulmonary:     Effort: Pulmonary effort is normal. No respiratory distress.     Breath sounds: Normal breath sounds. No wheezing.  Abdominal:     General: Bowel sounds are normal. There is no distension.     Palpations: Abdomen is soft. There is no mass.     Tenderness: There is no abdominal tenderness. There is no guarding or rebound.  Musculoskeletal:        General: No tenderness. Normal range of motion.     Cervical back: Normal range of motion and neck supple.  Skin:    General: Skin is warm.  Neurological:     Mental Status: He is alert and oriented to person, place, and time.  Psychiatric:        Mood and Affect: Affect normal.    LABORATORY DATA:  I have reviewed the data as listed Lab Results  Component Value Date   WBC 6.3 02/23/2021   HGB 9.3 (L) 02/23/2021   HCT 28.7 (L) 02/23/2021   MCV 95.7 02/23/2021   PLT 198 02/23/2021   Recent Labs    04/21/20 0924 06/02/20 0901 07/28/20 0938 09/22/20 0937 11/03/20 1008 12/29/20 0930 02/23/21 0912  NA 139 139 139   < > 138 141 139  K 4.8 4.9 4.6   < > 4.4 4.9 4.9  CL 110 108 107   < > 106 111 108  CO2 23 22 21*   < > _0 GLUCOSE 154* 169* 156*   < > 143* 149* 150*  BUN 36* 29* 28*   < > 32* 32* 36*  CREATININE 1.47* 1.38* 1.26*   < > 1.18 1.27* 1.27*  CALCIUM 9.4 9.4 8.6*   < > 9.5 9.3 9.4  GFRNONAA 47* 50* 56*   < > >60 59* 59*  GFRAA 54* 58* >60  --   --   --   --   PROT  --   --   --   --   --  7.3  --   ALBUMIN  --   --   --   --   --  4.3   --   AST  --   --   --   --   --  24  --   ALT  --   --   --   --   --  21  --   ALKPHOS  --   --   --   --   --  92  --   BILITOT  --   --   --   --   --  2.5*  --    < > = values in this interval not displayed.     No results found.  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  Patient currently on Retacrit [since April 2021]; hemoglobin between 8-9.  Patient is fairly asymptomatic. STABLE.    Discussed with the patient that if he gets more fatigue/symptomatic the options include Luspatercept/Revlimid.  #Today hemoglobin is 9.2.  STABLE.  Continue Aranesp for now.  FEB 2022- Ferritin-900+  # Stage kidney disease-stage III-STABLE.   # DISPOSITION:  # Retacrit today # in 2 week- lab- H&H; possible Retacrit # in 4 weeks- lab- H&H; possible Retacrit # in 6 weeks- lab- H&H; possible Retacrit # # in 8 weeks- MD; labs-cbc/bmp/ retacrit;-Dr.B  All questions were answered. The patient knows to call the clinic with any problems, questions or concerns.    Cammie Sickle, MD 02/23/2021 10:28 AM

## 2021-02-23 NOTE — Assessment & Plan Note (Signed)
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  Patient currently on Retacrit [since April 2021]; hemoglobin between 8-9.  Patient is fairly asymptomatic. STABLE.    Discussed with the patient that if he gets more fatigue/symptomatic the options include Luspatercept/Revlimid.  #Today hemoglobin is 9.2.  STABLE.  Continue Aranesp for now.  FEB 2022- Ferritin-900+  # Stage kidney disease-stage III-STABLE.   # DISPOSITION:  # Retacrit today # in 2 week- lab- H&H; possible Retacrit # in 4 weeks- lab- H&H; possible Retacrit # in 6 weeks- lab- H&H; possible Retacrit # # in 8 weeks- MD; labs-cbc/bmp/ retacrit;-Dr.B

## 2021-03-09 ENCOUNTER — Inpatient Hospital Stay: Payer: Medicare Other

## 2021-03-09 ENCOUNTER — Other Ambulatory Visit: Payer: Self-pay

## 2021-03-09 ENCOUNTER — Inpatient Hospital Stay: Payer: Medicare Other | Attending: Internal Medicine

## 2021-03-09 VITALS — BP 121/64

## 2021-03-09 DIAGNOSIS — D461 Refractory anemia with ring sideroblasts: Secondary | ICD-10-CM | POA: Diagnosis present

## 2021-03-09 DIAGNOSIS — D4620 Refractory anemia with excess of blasts, unspecified: Secondary | ICD-10-CM

## 2021-03-09 DIAGNOSIS — D539 Nutritional anemia, unspecified: Secondary | ICD-10-CM

## 2021-03-09 LAB — HEMOGLOBIN AND HEMATOCRIT, BLOOD
HCT: 26.7 % — ABNORMAL LOW (ref 39.0–52.0)
Hemoglobin: 8.9 g/dL — ABNORMAL LOW (ref 13.0–17.0)

## 2021-03-09 MED ORDER — EPOETIN ALFA-EPBX 10000 UNIT/ML IJ SOLN
20000.0000 [IU] | Freq: Once | INTRAMUSCULAR | Status: AC
Start: 2021-03-09 — End: 2021-03-09
  Administered 2021-03-09: 20000 [IU] via SUBCUTANEOUS
  Filled 2021-03-09: qty 2

## 2021-03-23 ENCOUNTER — Other Ambulatory Visit: Payer: Self-pay

## 2021-03-23 ENCOUNTER — Inpatient Hospital Stay: Payer: Medicare Other

## 2021-03-23 VITALS — BP 133/69 | HR 40

## 2021-03-23 DIAGNOSIS — D539 Nutritional anemia, unspecified: Secondary | ICD-10-CM

## 2021-03-23 DIAGNOSIS — D4620 Refractory anemia with excess of blasts, unspecified: Secondary | ICD-10-CM

## 2021-03-23 DIAGNOSIS — D461 Refractory anemia with ring sideroblasts: Secondary | ICD-10-CM | POA: Diagnosis not present

## 2021-03-23 LAB — HEMOGLOBIN AND HEMATOCRIT, BLOOD
HCT: 27.9 % — ABNORMAL LOW (ref 39.0–52.0)
Hemoglobin: 9.1 g/dL — ABNORMAL LOW (ref 13.0–17.0)

## 2021-03-23 MED ORDER — EPOETIN ALFA-EPBX 10000 UNIT/ML IJ SOLN
20000.0000 [IU] | Freq: Once | INTRAMUSCULAR | Status: AC
Start: 2021-03-23 — End: 2021-03-23
  Administered 2021-03-23: 20000 [IU] via SUBCUTANEOUS
  Filled 2021-03-23: qty 2

## 2021-04-06 ENCOUNTER — Inpatient Hospital Stay: Payer: Medicare Other | Attending: Internal Medicine

## 2021-04-06 ENCOUNTER — Inpatient Hospital Stay: Payer: Medicare Other

## 2021-04-06 VITALS — BP 139/71 | HR 44

## 2021-04-06 DIAGNOSIS — D461 Refractory anemia with ring sideroblasts: Secondary | ICD-10-CM | POA: Diagnosis present

## 2021-04-06 DIAGNOSIS — D539 Nutritional anemia, unspecified: Secondary | ICD-10-CM

## 2021-04-06 DIAGNOSIS — D4620 Refractory anemia with excess of blasts, unspecified: Secondary | ICD-10-CM

## 2021-04-06 LAB — HEMOGLOBIN AND HEMATOCRIT, BLOOD
HCT: 27.3 % — ABNORMAL LOW (ref 39.0–52.0)
Hemoglobin: 9 g/dL — ABNORMAL LOW (ref 13.0–17.0)

## 2021-04-06 MED ORDER — EPOETIN ALFA-EPBX 10000 UNIT/ML IJ SOLN
20000.0000 [IU] | Freq: Once | INTRAMUSCULAR | Status: AC
Start: 2021-04-06 — End: 2021-04-06
  Administered 2021-04-06: 20000 [IU] via SUBCUTANEOUS
  Filled 2021-04-06: qty 2

## 2021-04-11 ENCOUNTER — Other Ambulatory Visit: Payer: Self-pay | Admitting: *Deleted

## 2021-04-11 DIAGNOSIS — D4620 Refractory anemia with excess of blasts, unspecified: Secondary | ICD-10-CM

## 2021-04-11 DIAGNOSIS — D539 Nutritional anemia, unspecified: Secondary | ICD-10-CM

## 2021-04-11 DIAGNOSIS — D46Z Other myelodysplastic syndromes: Secondary | ICD-10-CM

## 2021-04-20 ENCOUNTER — Inpatient Hospital Stay: Payer: Medicare Other | Admitting: Internal Medicine

## 2021-04-20 ENCOUNTER — Encounter: Payer: Self-pay | Admitting: Internal Medicine

## 2021-04-20 ENCOUNTER — Inpatient Hospital Stay: Payer: Medicare Other

## 2021-04-20 DIAGNOSIS — D4620 Refractory anemia with excess of blasts, unspecified: Secondary | ICD-10-CM

## 2021-04-20 DIAGNOSIS — D539 Nutritional anemia, unspecified: Secondary | ICD-10-CM

## 2021-04-20 DIAGNOSIS — D461 Refractory anemia with ring sideroblasts: Secondary | ICD-10-CM | POA: Diagnosis not present

## 2021-04-20 LAB — CBC WITH DIFFERENTIAL/PLATELET
Abs Immature Granulocytes: 0.02 10*3/uL (ref 0.00–0.07)
Basophils Absolute: 0 10*3/uL (ref 0.0–0.1)
Basophils Relative: 1 %
Eosinophils Absolute: 0.2 10*3/uL (ref 0.0–0.5)
Eosinophils Relative: 5 %
HCT: 28.4 % — ABNORMAL LOW (ref 39.0–52.0)
Hemoglobin: 9.3 g/dL — ABNORMAL LOW (ref 13.0–17.0)
Immature Granulocytes: 0 %
Lymphocytes Relative: 37 %
Lymphs Abs: 1.7 10*3/uL (ref 0.7–4.0)
MCH: 31.8 pg (ref 26.0–34.0)
MCHC: 32.7 g/dL (ref 30.0–36.0)
MCV: 97.3 fL (ref 80.0–100.0)
Monocytes Absolute: 0.5 10*3/uL (ref 0.1–1.0)
Monocytes Relative: 10 %
Neutro Abs: 2.2 10*3/uL (ref 1.7–7.7)
Neutrophils Relative %: 47 %
Platelets: 213 10*3/uL (ref 150–400)
RBC: 2.92 MIL/uL — ABNORMAL LOW (ref 4.22–5.81)
RDW: 29.2 % — ABNORMAL HIGH (ref 11.5–15.5)
WBC: 4.7 10*3/uL (ref 4.0–10.5)
nRBC: 0.4 % — ABNORMAL HIGH (ref 0.0–0.2)

## 2021-04-20 LAB — BASIC METABOLIC PANEL
Anion gap: 9 (ref 5–15)
BUN: 36 mg/dL — ABNORMAL HIGH (ref 8–23)
CO2: 22 mmol/L (ref 22–32)
Calcium: 9.1 mg/dL (ref 8.9–10.3)
Chloride: 109 mmol/L (ref 98–111)
Creatinine, Ser: 1.32 mg/dL — ABNORMAL HIGH (ref 0.61–1.24)
GFR, Estimated: 57 mL/min — ABNORMAL LOW (ref >60)
Glucose, Bld: 181 mg/dL — ABNORMAL HIGH (ref 70–99)
Potassium: 4.6 mmol/L (ref 3.5–5.1)
Sodium: 140 mmol/L (ref 135–145)

## 2021-04-20 MED ORDER — EPOETIN ALFA-EPBX 10000 UNIT/ML IJ SOLN
20000.0000 [IU] | Freq: Once | INTRAMUSCULAR | Status: AC
  Administered 2021-04-20: 20000 [IU] via SUBCUTANEOUS
  Filled 2021-04-20: qty 2

## 2021-04-20 NOTE — Progress Notes (Signed)
[facility] NOTE  Patient Care Team: Baxter Hire, MD as PCP - General (Internal Medicine) Cammie Sickle, MD as Consulting Physician (Hematology and Oncology)  CHIEF COMPLAINTS/PURPOSE OF CONSULTATION: MDS  Oncology History Overview Note  # EGD-none; colonoscopy-never [Cologurad x2- NEG];FEB 2021- US- abdomen-no liver disease/ Mild splenic enlargement [460cc; kidney cysts]; O75 folic acid/LDH haptoglobin normal.  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   # NGS/MOLECULAR TESTS:[facility] ON hem- May 2021- Cux-1; DNMT3A; SF3B**  # PALLIATIVE CARE EVALUATION: NA   DIAGNOSIS: MDS low risk   GOALS: Control  CURRENT/MOST RECENT THERAPY : Retacrit    MDS (myelodysplastic syndrome), low grade ([facility])  02/18/2020 Initial Diagnosis   MDS (myelodysplastic syndrome), low grade ([facility])       HISTORY OF PRESENTING ILLNESS:  Vernon [name] 75 y.o.  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  Patient denies any worsening shortness of breath or cough.  Denies any worsening swelling in her legs.  Patient has not needed blood transfusions.  Chronic mild fatigue.  Review of Systems  Constitutional: Positive for malaise/fatigue. Negative for chills, diaphoresis, fever and weight loss.  HENT: Negative for nosebleeds and sore throat.   Eyes: Negative for double vision.  Respiratory: Negative for cough, hemoptysis, sputum production, shortness of breath and wheezing.   Cardiovascular: Negative for chest pain, palpitations, orthopnea and leg swelling.  Gastrointestinal: Negative for abdominal pain, blood in stool,  constipation, diarrhea, heartburn, melena, nausea and vomiting.  Genitourinary: Negative for dysuria, frequency and urgency.  Musculoskeletal: Negative for back pain and joint pain.  Skin: Negative.  Negative for itching and rash.  Neurological: Negative for dizziness, tingling, focal weakness, weakness and headaches.  Endo/Heme/Allergies: Does not bruise/bleed easily.  Psychiatric/Behavioral: Negative for depression. The patient is not nervous/anxious and does not have insomnia.     MEDICAL HISTORY:  Past Medical History:  Diagnosis Date  . Anemia   . Arthritis   . Complication of anesthesia    hard time getting bp up after knee replacement  . Diabetes mellitus without complication ([place])   . GERD (gastroesophageal reflux disease)    occ tums prn  . History of hiatal hernia   . Hypertension     SURGICAL HISTORY: Past Surgical History:  Procedure Laterality Date  . CARPAL TUNNEL RELEASE  2012  . JOINT REPLACEMENT Right 2010  . SHOULDER ARTHROSCOPY WITH ROTATOR CUFF REPAIR AND OPEN BICEPS TENODESIS Right 11/09/2019   Procedure: RIGHT SHOULDER ARTHROSCOPY WITH SUBSCAPULARIS REPAIR, SUBACROMIAL DECOMPRESSION,MINI OPEN ROTATOR CUFF REPAIR;  Surgeon: Leim Fabry, MD;  Location: [facility] ORS;  Service: Orthopedics;  Laterality: Right;    SOCIAL HISTORY: Social History   Socioeconomic History  . Marital status: Married    Spouse name: Not on file  . Number of children: Not on file  . Years of education: Not on file  . Highest education level: Not on file  Occupational History  . Not on file  Tobacco Use  . Smoking status: Former Smoker    Packs/day: 0.50    Years: 8.00    Pack years: 4.00    Types: Cigarettes    Quit date: 07/21/1978    Years since quitting: 42.7  .  Smokeless tobacco: Never Used  Substance and Sexual Activity  . Alcohol use: Yes    Alcohol/week: 0.0 - 1.0 standard drinks    Comment: rare beer  . Drug use: Never  . Sexual activity: Not on file  Other  Topics Concern  . Not on file  Social History Narrative   Lives in [place]; with wife; quit smoking in early 57s; ocassional/ rare [may be 1 a month beer]. 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    Social Determinants of Health   Financial Resource Strain: Not on file  Food Insecurity: Not on file  Transportation Needs: Not on file  Physical Activity: Not on file  Stress: Not on file  Social Connections: Not on file  Intimate Partner Violence: Not on file    FAMILY HISTORY: Family History  Problem Relation Age of Onset  . Cancer Maternal Uncle     ALLERGIES:  has No Known Allergies.  MEDICATIONS:  Current Outpatient Medications  Medication Sig Dispense Refill  . acetaminophen (TYLENOL) 650 MG CR tablet Take 650 mg by mouth every 8 (eight) hours as needed for pain.    Marland Kitchen allopurinol (ZYLOPRIM) 300 MG tablet Take 300 mg by mouth at bedtime.     Marland Kitchen apixaban (ELIQUIS) 5 MG TABS tablet Take 5 mg by mouth 2 (two) times daily.     . blood glucose meter kit and supplies Use 1 kit as needed.    . chlorhexidine (PERIDEX) 0.12 % solution PLEASE SEE ATTACHED FOR DETAILED DIRECTIONS    . Cyanocobalamin (VITAMIN B 12) 500 MCG TABS Take by mouth.    . ferrous sulfate 325 (65 FE) MG EC tablet Take 325 mg by mouth 2 (two) times daily. Patient reports takes once a day    . lisinopril (ZESTRIL) 40 MG tablet Take 40 mg by mouth at bedtime.     . metFORMIN (GLUCOPHAGE) 500 MG tablet Take 500 mg by mouth 2 (two) times daily with a meal.     . Multiple Vitamins-Minerals (CENTRUM SILVER PO) Take 1 tablet by mouth daily.    . simvastatin (ZOCOR) 80 MG tablet Take 40 mg by mouth daily at 6 PM.      No current facility-administered medications for this visit.      PHYSICAL EXAMINATION:   Vitals:   04/20/21 1034  BP: 130/61  Pulse: (!) 50  Resp: 16  Temp: (!) 97.1 F (36.2 C)  SpO2: 100%   Filed Weights   04/20/21 1034  Weight: 212 lb (96.2 kg)    Physical Exam HENT:     Head:  Normocephalic and atraumatic.     Mouth/Throat:     Pharynx: No oropharyngeal exudate.  Eyes:     Pupils: Pupils are equal, round, and reactive to light.  Cardiovascular:     Rate and Rhythm: Normal rate and regular rhythm.  Pulmonary:     Effort: Pulmonary effort is normal. No respiratory distress.     Breath sounds: Normal breath sounds. No wheezing.  Abdominal:     General: Bowel sounds are normal. There is no distension.     Palpations: Abdomen is soft. There is no mass.     Tenderness: There is no abdominal tenderness. There is no guarding or rebound.  Musculoskeletal:        General: No tenderness. Normal range of motion.     Cervical back: Normal range of motion and neck supple.  Skin:    General: Skin is warm.  Neurological:  Mental Status: He is alert and oriented to person, place, and time.  Psychiatric:        Mood and Affect: Affect normal.    LABORATORY DATA:  I have reviewed the data as listed Lab Results  Component Value Date   WBC 4.7 04/20/2021   HGB 9.3 (L) 04/20/2021   HCT 28.4 (L) 04/20/2021   MCV 97.3 04/20/2021   PLT 213 04/20/2021   Recent Labs    04/21/20 0924 06/02/20 0901 07/28/20 0938 09/22/20 0937 12/29/20 0930 02/23/21 0912 04/20/21 1016  NA 139 139 139   < > 141 139 140  K 4.8 4.9 4.6   < > 4.9 4.9 4.6  CL 110 108 107   < > 111 108 109  CO2 23 22 21*   < > _0 GLUCOSE 154* 169* 156*   < > 149* 150* 181*  BUN 36* 29* 28*   < > 32* 36* 36*  CREATININE 1.47* 1.38* 1.26*   < > 1.27* 1.27* 1.32*  CALCIUM 9.4 9.4 8.6*   < > 9.3 9.4 9.1  GFRNONAA 47* 50* 56*   < > 59* 59* 57*  GFRAA 54* 58* >60  --   --   --   --   PROT  --   --   --   --  7.3  --   --   ALBUMIN  --   --   --   --  4.3  --   --   AST  --   --   --   --  24  --   --   ALT  --   --   --   --  21  --   --   ALKPHOS  --   --   --   --  92  --   --   BILITOT  --   --   --   --  2.5*  --   --    < > = values in this interval not displayed.     No results  found.  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  Patient currently on Retacrit [since April 2021]; hemoglobin between 8-9.  Patient is fairly asymptomatic. STABLE.    Discussed with the patient that if he gets more fatigue/symptomatic the options include Luspatercept/Revlimid.  #Today hemoglobin is 9.3.  STABLE  Continue Aranesp for now.  FEB 2022- Ferritin-900+  # Stage kidney disease-stage III-STABLE.   # DISPOSITION:  # Retacrit today # in 2 week- lab- H&H; possible Retacrit # in 4 weeks- lab- H&H; possible Retacrit # in 6 weeks- lab- H&H; possible Retacrit # # in 8 weeks- MD; labs-cbc/bmp/ retacrit;-Dr.B  All questions were answered. The patient knows to call the clinic with any problems, questions or concerns.    Cammie Sickle, MD 04/20/2021 12:48 PM

## 2021-04-20 NOTE — Assessment & Plan Note (Signed)
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  Patient currently on Retacrit [since April 2021]; hemoglobin between 8-9.  Patient is fairly asymptomatic. STABLE.    Discussed with the patient that if he gets more fatigue/symptomatic the options include Luspatercept/Revlimid.  #Today hemoglobin is 9.3.  STABLE  Continue Aranesp for now.  FEB 2022- Ferritin-900+  # Stage kidney disease-stage III-STABLE.   # DISPOSITION:  # Retacrit today # in 2 week- lab- H&H; possible Retacrit # in 4 weeks- lab- H&H; possible Retacrit # in 6 weeks- lab- H&H; possible Retacrit # # in 8 weeks- MD; labs-cbc/bmp/ retacrit;-Dr.B

## 2021-05-04 ENCOUNTER — Inpatient Hospital Stay: Payer: Medicare Other

## 2021-05-04 ENCOUNTER — Inpatient Hospital Stay: Payer: Medicare Other | Attending: Internal Medicine

## 2021-05-04 VITALS — BP 154/73 | HR 47

## 2021-05-04 DIAGNOSIS — D539 Nutritional anemia, unspecified: Secondary | ICD-10-CM

## 2021-05-04 DIAGNOSIS — D4620 Refractory anemia with excess of blasts, unspecified: Secondary | ICD-10-CM | POA: Diagnosis present

## 2021-05-04 LAB — HEMATOCRIT: HCT: 26.1 % — ABNORMAL LOW (ref 39.0–52.0)

## 2021-05-04 LAB — HEMOGLOBIN: Hemoglobin: 8.8 g/dL — ABNORMAL LOW (ref 13.0–17.0)

## 2021-05-04 MED ORDER — EPOETIN ALFA-EPBX 10000 UNIT/ML IJ SOLN
20000.0000 [IU] | Freq: Once | INTRAMUSCULAR | Status: AC
  Administered 2021-05-04: 20000 [IU] via SUBCUTANEOUS

## 2021-05-18 ENCOUNTER — Inpatient Hospital Stay: Payer: Medicare Other

## 2021-05-18 VITALS — BP 153/68 | HR 50

## 2021-05-18 DIAGNOSIS — D539 Nutritional anemia, unspecified: Secondary | ICD-10-CM

## 2021-05-18 DIAGNOSIS — D4620 Refractory anemia with excess of blasts, unspecified: Secondary | ICD-10-CM | POA: Diagnosis not present

## 2021-05-18 LAB — HEMOGLOBIN: Hemoglobin: 8.6 g/dL — ABNORMAL LOW (ref 13.0–17.0)

## 2021-05-18 LAB — HEMATOCRIT: HCT: 26 % — ABNORMAL LOW (ref 39.0–52.0)

## 2021-05-18 MED ORDER — EPOETIN ALFA-EPBX 10000 UNIT/ML IJ SOLN
20000.0000 [IU] | Freq: Once | INTRAMUSCULAR | Status: AC
  Administered 2021-05-18: 20000 [IU] via SUBCUTANEOUS
  Filled 2021-05-18: qty 2

## 2021-06-01 ENCOUNTER — Inpatient Hospital Stay: Payer: Medicare Other | Attending: Internal Medicine

## 2021-06-01 ENCOUNTER — Inpatient Hospital Stay: Payer: Medicare Other

## 2021-06-01 VITALS — BP 135/75 | HR 58

## 2021-06-01 DIAGNOSIS — D4620 Refractory anemia with excess of blasts, unspecified: Secondary | ICD-10-CM

## 2021-06-01 DIAGNOSIS — D539 Nutritional anemia, unspecified: Secondary | ICD-10-CM

## 2021-06-01 LAB — HEMATOCRIT: HCT: 26.7 % — ABNORMAL LOW (ref 39.0–52.0)

## 2021-06-01 LAB — HEMOGLOBIN: Hemoglobin: 8.7 g/dL — ABNORMAL LOW (ref 13.0–17.0)

## 2021-06-01 MED ORDER — EPOETIN ALFA-EPBX 10000 UNIT/ML IJ SOLN
20000.0000 [IU] | Freq: Once | INTRAMUSCULAR | Status: AC
  Administered 2021-06-01: 20000 [IU] via SUBCUTANEOUS
  Filled 2021-06-01: qty 2

## 2021-06-15 ENCOUNTER — Inpatient Hospital Stay: Payer: Medicare Other | Admitting: Internal Medicine

## 2021-06-15 ENCOUNTER — Inpatient Hospital Stay: Payer: Medicare Other

## 2021-06-15 ENCOUNTER — Encounter: Payer: Self-pay | Admitting: Internal Medicine

## 2021-06-15 DIAGNOSIS — D4620 Refractory anemia with excess of blasts, unspecified: Secondary | ICD-10-CM

## 2021-06-15 LAB — CBC WITH DIFFERENTIAL/PLATELET
Abs Immature Granulocytes: 0.03 10*3/uL (ref 0.00–0.07)
Basophils Absolute: 0 10*3/uL (ref 0.0–0.1)
Basophils Relative: 1 %
Eosinophils Absolute: 0.2 10*3/uL (ref 0.0–0.5)
Eosinophils Relative: 3 %
HCT: 26.5 % — ABNORMAL LOW (ref 39.0–52.0)
Hemoglobin: 8.6 g/dL — ABNORMAL LOW (ref 13.0–17.0)
Immature Granulocytes: 1 %
Lymphocytes Relative: 25 %
Lymphs Abs: 1.3 10*3/uL (ref 0.7–4.0)
MCH: 32.6 pg (ref 26.0–34.0)
MCHC: 32.5 g/dL (ref 30.0–36.0)
MCV: 100.4 fL — ABNORMAL HIGH (ref 80.0–100.0)
Monocytes Absolute: 0.4 10*3/uL (ref 0.1–1.0)
Monocytes Relative: 7 %
Neutro Abs: 3.3 10*3/uL (ref 1.7–7.7)
Neutrophils Relative %: 63 %
Platelets: 234 10*3/uL (ref 150–400)
RBC: 2.64 MIL/uL — ABNORMAL LOW (ref 4.22–5.81)
RDW: 28.5 % — ABNORMAL HIGH (ref 11.5–15.5)
WBC: 5.2 10*3/uL (ref 4.0–10.5)
nRBC: 0.6 % — ABNORMAL HIGH (ref 0.0–0.2)

## 2021-06-15 LAB — BASIC METABOLIC PANEL
Anion gap: 7 (ref 5–15)
BUN: 29 mg/dL — ABNORMAL HIGH (ref 8–23)
CO2: 22 mmol/L (ref 22–32)
Calcium: 9.7 mg/dL (ref 8.9–10.3)
Chloride: 109 mmol/L (ref 98–111)
Creatinine, Ser: 1.42 mg/dL — ABNORMAL HIGH (ref 0.61–1.24)
GFR, Estimated: 52 mL/min — ABNORMAL LOW (ref >60)
Glucose, Bld: 192 mg/dL — ABNORMAL HIGH (ref 70–99)
Potassium: 5 mmol/L (ref 3.5–5.1)
Sodium: 138 mmol/L (ref 135–145)

## 2021-06-15 MED ORDER — EPOETIN ALFA-EPBX 10000 UNIT/ML IJ SOLN
20000.0000 [IU] | Freq: Once | INTRAMUSCULAR | Status: AC
  Administered 2021-06-15: 20000 [IU] via SUBCUTANEOUS
  Filled 2021-06-15: qty 2

## 2021-06-15 NOTE — Assessment & Plan Note (Addendum)
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  Patient currently on Retacrit [since April 2021]; hemoglobin between 8-9.  Patient is fairly asymptomatic. STABLE.   # Discussed with the patient that if he gets more fatigue/symptomatic the options include Luspatercept/Revlimid.  #Today hemoglobin is 8.4 STABLE  Continue Aranesp for now.  FEB 2022- Ferritin-900+  # Stage kidney disease-stage III-STABLE.   # DISPOSITION:  # Retacrit today # in 2 week- lab- H&H; possible Retacrit # in 4 weeks- lab- H&H; possible Retacrit # in 6 weeks- lab- H&H; possible Retacrit # # in 8 weeks- MD; labs-cbc/bmp/ retacrit;-Dr.B

## 2021-06-15 NOTE — Progress Notes (Signed)
[facility] NOTE  Patient Care Team: Baxter Hire, MD as PCP - General (Internal Medicine) Cammie Sickle, MD as Consulting Physician (Hematology and Oncology)  CHIEF COMPLAINTS/PURPOSE OF CONSULTATION: MDS  Oncology History Overview Note  # EGD-none; colonoscopy-never [Cologurad x2- NEG];FEB 2021- US- abdomen-no liver disease/ Mild splenic enlargement [460cc; kidney cysts]; N68 folic acid/LDH haptoglobin normal.  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   # NGS/MOLECULAR TESTS:[facility] ON hem- May 2021- Cux-1; DNMT3A; SF3B**  # PALLIATIVE CARE EVALUATION: NA   DIAGNOSIS: MDS low risk   GOALS: Control  CURRENT/MOST RECENT THERAPY : Retacrit    MDS (myelodysplastic syndrome), low grade ([place])  02/18/2020 Initial Diagnosis   MDS (myelodysplastic syndrome), low grade ([facility])       HISTORY OF PRESENTING ILLNESS:  Vernon [name] 75 y.o.  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  Patient denies any worsening fatigue.  Denies any worsening shortness of breath.  Denies any swelling in the legs.  Has not needed any transfusions.  Review of Systems  Constitutional:  Positive for malaise/fatigue. Negative for chills, diaphoresis, fever and weight loss.  HENT:  Negative for nosebleeds and sore throat.   Eyes:  Negative for double vision.  Respiratory:  Negative for cough, hemoptysis, sputum production, shortness of breath and wheezing.   Cardiovascular:  Negative for chest pain, palpitations, orthopnea and leg swelling.  Gastrointestinal:  Negative for abdominal pain, blood in stool, constipation,  diarrhea, heartburn, melena, nausea and vomiting.  Genitourinary:  Negative for dysuria, frequency and urgency.  Musculoskeletal:  Negative for back pain and joint pain.  Skin: Negative.  Negative for itching and rash.  Neurological:  Negative for dizziness, tingling, focal weakness, weakness and headaches.  Endo/Heme/Allergies:  Does not bruise/bleed easily.  Psychiatric/Behavioral:  Negative for depression. The patient is not nervous/anxious and does not have insomnia.    MEDICAL HISTORY:  Past Medical History:  Diagnosis Date   Anemia    Arthritis    Complication of anesthesia    hard time getting bp up after knee replacement   Diabetes mellitus without complication ([facility])    GERD (gastroesophageal reflux disease)    occ tums prn   History of hiatal hernia    Hypertension     SURGICAL HISTORY: Past Surgical History:  Procedure Laterality Date   CARPAL TUNNEL RELEASE  2012   JOINT REPLACEMENT Right 2010   SHOULDER ARTHROSCOPY WITH ROTATOR CUFF REPAIR AND OPEN BICEPS TENODESIS Right 11/09/2019   Procedure: RIGHT SHOULDER ARTHROSCOPY WITH SUBSCAPULARIS REPAIR, SUBACROMIAL DECOMPRESSION,MINI OPEN ROTATOR CUFF REPAIR;  Surgeon: Leim Fabry, MD;  Location: [facility] ORS;  Service: Orthopedics;  Laterality: Right;    SOCIAL HISTORY: Social History   Socioeconomic History   Marital status: Married    Spouse name: Not on file   Number of children: Not on file   Years of education: Not on file   Highest education level: Not on file  Occupational History   Not on file  Tobacco Use   Smoking status: Former    Packs/day: 0.50    Years: 8.00    Pack years: 4.00    Types: Cigarettes    Quit date: 07/21/1978  Years since quitting: 42.9   Smokeless tobacco: Never  Substance and Sexual Activity   Alcohol use: Yes    Alcohol/week: 0.0 - 1.0 standard drinks    Comment: rare beer   Drug use: Never   Sexual activity: Not on file  Other Topics Concern   Not on file  Social History  Narrative   Lives in [place]; with wife; quit smoking in early 29s; ocassional/ rare [may be 1 a month beer]. 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    Social Determinants of Health   Financial Resource Strain: Not on file  Food Insecurity: Not on file  Transportation Needs: Not on file  Physical Activity: Not on file  Stress: Not on file  Social Connections: Not on file  Intimate Partner Violence: Not on file    FAMILY HISTORY: Family History  Problem Relation Age of Onset   Cancer Maternal Uncle     ALLERGIES:  has No Known Allergies.  MEDICATIONS:  Current Outpatient Medications  Medication Sig Dispense Refill   acetaminophen (TYLENOL) 650 MG CR tablet Take 650 mg by mouth every 8 (eight) hours as needed for pain.     allopurinol (ZYLOPRIM) 300 MG tablet Take 300 mg by mouth at bedtime.      apixaban (ELIQUIS) 5 MG TABS tablet Take 5 mg by mouth 2 (two) times daily.      blood glucose meter kit and supplies Use 1 kit as needed.     Cyanocobalamin (VITAMIN B 12) 500 MCG TABS Take by mouth.     ferrous sulfate 325 (65 FE) MG EC tablet Take 325 mg by mouth 2 (two) times daily. Patient reports takes once a day     lisinopril (ZESTRIL) 40 MG tablet Take 40 mg by mouth at bedtime.      metFORMIN (GLUCOPHAGE) 500 MG tablet Take 500 mg by mouth 2 (two) times daily with a meal.      Multiple Vitamins-Minerals (CENTRUM SILVER PO) Take 1 tablet by mouth daily.     simvastatin (ZOCOR) 80 MG tablet Take 40 mg by mouth daily at 6 PM.      No current facility-administered medications for this visit.      PHYSICAL EXAMINATION:   Vitals:   06/15/21 1035  BP: 120/63  Pulse: (!) 50  Resp: 16  Temp: 98.1 F (36.7 C)  SpO2: 98%   Filed Weights   06/15/21 1035  Weight: 216 lb (98 kg)    Physical Exam HENT:     Head: Normocephalic and atraumatic.     Mouth/Throat:     Pharynx: No oropharyngeal exudate.  Eyes:     Pupils: Pupils are equal, round, and reactive to  light.  Cardiovascular:     Rate and Rhythm: Normal rate and regular rhythm.  Pulmonary:     Effort: Pulmonary effort is normal. No respiratory distress.     Breath sounds: Normal breath sounds. No wheezing.  Abdominal:     General: Bowel sounds are normal. There is no distension.     Palpations: Abdomen is soft. There is no mass.     Tenderness: no abdominal tenderness There is no guarding or rebound.  Musculoskeletal:        General: No tenderness. Normal range of motion.     Cervical back: Normal range of motion and neck supple.  Skin:    General: Skin is warm.  Neurological:     Mental Status: He is alert and oriented to person, place, and time.  Psychiatric:        Mood and Affect: Affect normal.   LABORATORY DATA:  I have reviewed the data as listed Lab Results  Component Value Date   WBC 5.2 06/15/2021   HGB 8.6 (L) 06/15/2021   HCT 26.5 (L) 06/15/2021   MCV 100.4 (H) 06/15/2021   PLT 234 06/15/2021   Recent Labs    07/28/20 0938 09/22/20 0937 12/29/20 0930 02/23/21 0912 04/20/21 1016 06/15/21 1001  NA 139   < > 141 139 140 138  K 4.6   < > 4.9 4.9 4.6 5.0  CL 107   < > 111 108 109 109  CO2 21*   < > _0 GLUCOSE 156*   < > 149* 150* 181* 192*  BUN 28*   < > 32* 36* 36* 29*  CREATININE 1.26*   < > 1.27* 1.27* 1.32* 1.42*  CALCIUM 8.6*   < > 9.3 9.4 9.1 9.7  GFRNONAA 56*   < > 59* 59* 57* 52*  GFRAA >60  --   --   --   --   --   PROT  --   --  7.3  --   --   --   ALBUMIN  --   --  4.3  --   --   --   AST  --   --  24  --   --   --   ALT  --   --  21  --   --   --   ALKPHOS  --   --  92  --   --   --   BILITOT  --   --  2.5*  --   --   --    < > = values in this interval not displayed.     No results found.  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  Patient currently on Retacrit [since April 2021]; hemoglobin between 8-9.  Patient is  fairly asymptomatic. STABLE.   # Discussed with the patient that if he gets more fatigue/symptomatic the options include Luspatercept/Revlimid.  #Today hemoglobin is 8.4 STABLE  Continue Aranesp for now.  FEB 2022- Ferritin-900+  # Stage kidney disease-stage III-STABLE.   # DISPOSITION:  # Retacrit today # in 2 week- lab- H&H; possible Retacrit # in 4 weeks- lab- H&H; possible Retacrit # in 6 weeks- lab- H&H; possible Retacrit # # in 8 weeks- MD; labs-cbc/bmp/ retacrit;-Dr.B  All questions were answered. The patient knows to call the clinic with any problems, questions or concerns.    Cammie Sickle, MD 06/18/2021 5:16 PM

## 2021-06-18 ENCOUNTER — Encounter: Payer: Self-pay | Admitting: Internal Medicine

## 2021-06-29 ENCOUNTER — Other Ambulatory Visit: Payer: Self-pay

## 2021-06-29 ENCOUNTER — Inpatient Hospital Stay: Payer: Medicare Other

## 2021-06-29 ENCOUNTER — Inpatient Hospital Stay: Payer: Medicare Other | Attending: Internal Medicine

## 2021-06-29 VITALS — BP 137/56 | HR 55

## 2021-06-29 DIAGNOSIS — D539 Nutritional anemia, unspecified: Secondary | ICD-10-CM

## 2021-06-29 DIAGNOSIS — N183 Chronic kidney disease, stage 3 unspecified: Secondary | ICD-10-CM | POA: Diagnosis not present

## 2021-06-29 DIAGNOSIS — D4620 Refractory anemia with excess of blasts, unspecified: Secondary | ICD-10-CM

## 2021-06-29 DIAGNOSIS — D461 Refractory anemia with ring sideroblasts: Secondary | ICD-10-CM | POA: Insufficient documentation

## 2021-06-29 LAB — HEMOGLOBIN: Hemoglobin: 9.2 g/dL — ABNORMAL LOW (ref 13.0–17.0)

## 2021-06-29 LAB — HEMATOCRIT: HCT: 28 % — ABNORMAL LOW (ref 39.0–52.0)

## 2021-06-29 MED ORDER — EPOETIN ALFA-EPBX 10000 UNIT/ML IJ SOLN
20000.0000 [IU] | Freq: Once | INTRAMUSCULAR | Status: AC
  Administered 2021-06-29: 20000 [IU] via SUBCUTANEOUS
  Filled 2021-06-29: qty 2

## 2021-07-13 ENCOUNTER — Inpatient Hospital Stay: Payer: Medicare Other

## 2021-07-13 VITALS — BP 129/75 | HR 64

## 2021-07-13 DIAGNOSIS — D461 Refractory anemia with ring sideroblasts: Secondary | ICD-10-CM | POA: Diagnosis not present

## 2021-07-13 DIAGNOSIS — D4620 Refractory anemia with excess of blasts, unspecified: Secondary | ICD-10-CM

## 2021-07-13 DIAGNOSIS — D539 Nutritional anemia, unspecified: Secondary | ICD-10-CM

## 2021-07-13 LAB — HEMOGLOBIN: Hemoglobin: 8.7 g/dL — ABNORMAL LOW (ref 13.0–17.0)

## 2021-07-13 LAB — HEMATOCRIT: HCT: 26.7 % — ABNORMAL LOW (ref 39.0–52.0)

## 2021-07-13 MED ORDER — EPOETIN ALFA-EPBX 10000 UNIT/ML IJ SOLN
20000.0000 [IU] | Freq: Once | INTRAMUSCULAR | Status: AC
  Administered 2021-07-13: 20000 [IU] via SUBCUTANEOUS
  Filled 2021-07-13: qty 2

## 2021-07-26 DIAGNOSIS — I071 Rheumatic tricuspid insufficiency: Secondary | ICD-10-CM | POA: Insufficient documentation

## 2021-07-27 ENCOUNTER — Inpatient Hospital Stay: Payer: Medicare Other | Attending: Internal Medicine

## 2021-07-27 ENCOUNTER — Inpatient Hospital Stay: Payer: Medicare Other

## 2021-07-27 DIAGNOSIS — N183 Chronic kidney disease, stage 3 unspecified: Secondary | ICD-10-CM | POA: Diagnosis not present

## 2021-07-27 DIAGNOSIS — D461 Refractory anemia with ring sideroblasts: Secondary | ICD-10-CM | POA: Insufficient documentation

## 2021-07-27 DIAGNOSIS — D539 Nutritional anemia, unspecified: Secondary | ICD-10-CM

## 2021-07-27 DIAGNOSIS — D4620 Refractory anemia with excess of blasts, unspecified: Secondary | ICD-10-CM

## 2021-07-27 LAB — HEMOGLOBIN: Hemoglobin: 9.1 g/dL — ABNORMAL LOW (ref 13.0–17.0)

## 2021-07-27 MED ORDER — EPOETIN ALFA-EPBX 20000 UNIT/ML IJ SOLN
20000.0000 [IU] | Freq: Once | INTRAMUSCULAR | Status: AC
  Administered 2021-07-27: 20000 [IU] via SUBCUTANEOUS
  Filled 2021-07-27: qty 1

## 2021-08-09 ENCOUNTER — Other Ambulatory Visit: Payer: Self-pay | Admitting: *Deleted

## 2021-08-09 DIAGNOSIS — D4620 Refractory anemia with excess of blasts, unspecified: Secondary | ICD-10-CM

## 2021-08-09 DIAGNOSIS — D539 Nutritional anemia, unspecified: Secondary | ICD-10-CM

## 2021-08-10 ENCOUNTER — Encounter: Payer: Self-pay | Admitting: Internal Medicine

## 2021-08-10 ENCOUNTER — Other Ambulatory Visit: Payer: Self-pay

## 2021-08-10 ENCOUNTER — Inpatient Hospital Stay: Payer: Medicare Other

## 2021-08-10 ENCOUNTER — Inpatient Hospital Stay: Payer: Medicare Other | Admitting: Internal Medicine

## 2021-08-10 DIAGNOSIS — D461 Refractory anemia with ring sideroblasts: Secondary | ICD-10-CM | POA: Diagnosis not present

## 2021-08-10 DIAGNOSIS — D539 Nutritional anemia, unspecified: Secondary | ICD-10-CM

## 2021-08-10 DIAGNOSIS — D4620 Refractory anemia with excess of blasts, unspecified: Secondary | ICD-10-CM

## 2021-08-10 LAB — CBC WITH DIFFERENTIAL/PLATELET
Abs Immature Granulocytes: 0.05 10*3/uL (ref 0.00–0.07)
Basophils Absolute: 0 10*3/uL (ref 0.0–0.1)
Basophils Relative: 1 %
Eosinophils Absolute: 0.2 10*3/uL (ref 0.0–0.5)
Eosinophils Relative: 2 %
HCT: 26.7 % — ABNORMAL LOW (ref 39.0–52.0)
Hemoglobin: 8.7 g/dL — ABNORMAL LOW (ref 13.0–17.0)
Immature Granulocytes: 1 %
Lymphocytes Relative: 22 %
Lymphs Abs: 1.4 10*3/uL (ref 0.7–4.0)
MCH: 32 pg (ref 26.0–34.0)
MCHC: 32.6 g/dL (ref 30.0–36.0)
MCV: 98.2 fL (ref 80.0–100.0)
Monocytes Absolute: 0.4 10*3/uL (ref 0.1–1.0)
Monocytes Relative: 7 %
Neutro Abs: 4.2 10*3/uL (ref 1.7–7.7)
Neutrophils Relative %: 67 %
Platelets: 193 10*3/uL (ref 150–400)
RBC: 2.72 MIL/uL — ABNORMAL LOW (ref 4.22–5.81)
RDW: 27.5 % — ABNORMAL HIGH (ref 11.5–15.5)
WBC: 6.2 10*3/uL (ref 4.0–10.5)
nRBC: 0.5 % — ABNORMAL HIGH (ref 0.0–0.2)

## 2021-08-10 LAB — BASIC METABOLIC PANEL
Anion gap: 6 (ref 5–15)
BUN: 31 mg/dL — ABNORMAL HIGH (ref 8–23)
CO2: 23 mmol/L (ref 22–32)
Calcium: 9.3 mg/dL (ref 8.9–10.3)
Chloride: 107 mmol/L (ref 98–111)
Creatinine, Ser: 1.29 mg/dL — ABNORMAL HIGH (ref 0.61–1.24)
GFR, Estimated: 58 mL/min — ABNORMAL LOW (ref >60)
Glucose, Bld: 143 mg/dL — ABNORMAL HIGH (ref 70–99)
Potassium: 5.1 mmol/L (ref 3.5–5.1)
Sodium: 136 mmol/L (ref 135–145)

## 2021-08-10 MED ORDER — EPOETIN ALFA-EPBX 10000 UNIT/ML IJ SOLN
20000.0000 [IU] | Freq: Once | INTRAMUSCULAR | Status: AC
Start: 2021-08-10 — End: 2021-08-10
  Administered 2021-08-10: 20000 [IU] via SUBCUTANEOUS
  Filled 2021-08-10: qty 2

## 2021-08-10 NOTE — Progress Notes (Signed)
[facility] NOTE  Patient Care Team: Baxter Hire, MD as PCP - General (Internal Medicine) Cammie Sickle, MD as Consulting Physician (Hematology and Oncology)  CHIEF COMPLAINTS/PURPOSE OF CONSULTATION: MDS  Oncology History Overview Note  # EGD-none; colonoscopy-never [Cologurad x2- NEG];FEB 2021- US- abdomen-no liver disease/ Mild splenic enlargement [460cc; kidney cysts]; H88 folic acid/LDH haptoglobin normal.  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   # NGS/MOLECULAR TESTS:[facility] ON hem- May 2021- Cux-1; DNMT3A; SF3B**  # PALLIATIVE CARE EVALUATION: NA   DIAGNOSIS: MDS low risk   GOALS: Control  CURRENT/MOST RECENT THERAPY : Retacrit    MDS (myelodysplastic syndrome), low grade ([place])  02/18/2020 Initial Diagnosis   MDS (myelodysplastic syndrome), low grade (HCC)       HISTORY OF PRESENTING ILLNESS: Ambulating independently. Vernon [name] 75 y.o.  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  Denies any worsening fatigue or swelling in the legs.  Denies any shortness of breath or cough.  No transfusion needed  Review of Systems  Constitutional:  Positive for malaise/fatigue. Negative for chills, diaphoresis, fever and weight loss.  HENT:  Negative for nosebleeds and sore throat.   Eyes:  Negative for double vision.  Respiratory:  Negative for cough, hemoptysis, sputum production, shortness of breath and wheezing.   Cardiovascular:  Negative for chest pain, palpitations, orthopnea and leg swelling.  Gastrointestinal:  Negative for abdominal pain, blood in stool, constipation,  diarrhea, heartburn, melena, nausea and vomiting.  Genitourinary:  Negative for dysuria, frequency and urgency.  Musculoskeletal:  Negative for back pain and joint pain.  Skin: Negative.  Negative for itching and rash.  Neurological:  Negative for dizziness, tingling, focal weakness, weakness and headaches.  Endo/Heme/Allergies:  Does not bruise/bleed easily.  Psychiatric/Behavioral:  Negative for depression. The patient is not nervous/anxious and does not have insomnia.    MEDICAL HISTORY:  Past Medical History:  Diagnosis Date   Anemia    Arthritis    Complication of anesthesia    hard time getting bp up after knee replacement   Diabetes mellitus without complication (HCC)    GERD (gastroesophageal reflux disease)    occ tums prn   History of hiatal hernia    Hypertension     SURGICAL HISTORY: Past Surgical History:  Procedure Laterality Date   CARPAL TUNNEL RELEASE  2012   JOINT REPLACEMENT Right 2010   SHOULDER ARTHROSCOPY WITH ROTATOR CUFF REPAIR AND OPEN BICEPS TENODESIS Right 11/09/2019   Procedure: RIGHT SHOULDER ARTHROSCOPY WITH SUBSCAPULARIS REPAIR, SUBACROMIAL DECOMPRESSION,MINI OPEN ROTATOR CUFF REPAIR;  Surgeon: Leim Fabry, MD;  Location: [facility] ORS;  Service: Orthopedics;  Laterality: Right;    SOCIAL HISTORY: Social History   Socioeconomic History   Marital status: Married    Spouse name: Not on file   Number of children: Not on file   Years of education: Not on file   Highest education level: Not on file  Occupational History   Not on file  Tobacco Use   Smoking status: Former    Packs/day: 0.50    Years: 8.00    Pack years: 4.00    Types: Cigarettes    Quit date: 07/21/1978    Years since quitting:  43.0   Smokeless tobacco: Never  Substance and Sexual Activity   Alcohol use: Yes    Alcohol/week: 0.0 - 1.0 standard drinks    Comment: rare beer   Drug use: Never   Sexual activity: Not on file  Other Topics Concern   Not on file  Social History  Narrative   Lives in [place]; with wife; quit smoking in early 54s; ocassional/ rare [may be 1 a month beer]. 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    Social Determinants of Health   Financial Resource Strain: Not on file  Food Insecurity: Not on file  Transportation Needs: Not on file  Physical Activity: Not on file  Stress: Not on file  Social Connections: Not on file  Intimate Partner Violence: Not on file    FAMILY HISTORY: Family History  Problem Relation Age of Onset   Cancer Maternal Uncle     ALLERGIES:  has No Known Allergies.  MEDICATIONS:  Current Outpatient Medications  Medication Sig Dispense Refill   acetaminophen (TYLENOL) 650 MG CR tablet Take 650 mg by mouth every 8 (eight) hours as needed for pain.     allopurinol (ZYLOPRIM) 300 MG tablet Take 300 mg by mouth at bedtime.      apixaban (ELIQUIS) 5 MG TABS tablet Take 5 mg by mouth 2 (two) times daily.      blood glucose meter kit and supplies Use 1 kit as needed.     Cyanocobalamin (VITAMIN B 12) 500 MCG TABS Take by mouth.     ferrous sulfate 325 (65 FE) MG EC tablet Take 325 mg by mouth 2 (two) times daily. Patient reports takes once a day     lisinopril (ZESTRIL) 40 MG tablet Take 40 mg by mouth at bedtime.      metFORMIN (GLUCOPHAGE) 500 MG tablet Take 500 mg by mouth 2 (two) times daily with a meal.      Multiple Vitamins-Minerals (CENTRUM SILVER PO) Take 1 tablet by mouth daily.     simvastatin (ZOCOR) 80 MG tablet Take 40 mg by mouth daily at 6 PM.      No current facility-administered medications for this visit.      PHYSICAL EXAMINATION:   Vitals:   08/10/21 1147  BP: 127/78  Pulse: 69  Resp: 18  Temp: (!) 97.4 F (36.3 C)  SpO2: 99%   Filed Weights   08/10/21 1147  Weight: 217 lb (98.4 kg)    Physical Exam HENT:     Head: Normocephalic and atraumatic.     Mouth/Throat:     Pharynx: No oropharyngeal exudate.  Eyes:     Pupils: Pupils are equal, round, and reactive to  light.  Cardiovascular:     Rate and Rhythm: Normal rate and regular rhythm.  Pulmonary:     Effort: Pulmonary effort is normal. No respiratory distress.     Breath sounds: Normal breath sounds. No wheezing.  Abdominal:     General: Bowel sounds are normal. There is no distension.     Palpations: Abdomen is soft. There is no mass.     Tenderness: There is no abdominal tenderness. There is no guarding or rebound.  Musculoskeletal:        General: No tenderness. Normal range of motion.     Cervical back: Normal range of motion and neck supple.  Skin:    General: Skin is warm.  Neurological:     Mental Status: He is alert and oriented to person, place, and time.  Psychiatric:        Mood and Affect: Affect normal.   LABORATORY DATA:  I have reviewed the data as listed Lab Results  Component Value Date   WBC 6.2 08/10/2021   HGB 8.7 (L) 08/10/2021   HCT 26.7 (L) 08/10/2021   MCV 98.2 08/10/2021   PLT 193 08/10/2021   Recent Labs    12/29/20 0930 02/23/21 0912 04/20/21 1016 06/15/21 1001 08/10/21 1019  NA 141   < > 140 138 136  K 4.9   < > 4.6 5.0 5.1  CL 111   < > 109 109 107  CO2 22   < > $R'22 22 23  'LQ$ GLUCOSE 149*   < > 181* 192* 143*  BUN 32*   < > 36* 29* 31*  CREATININE 1.27*   < > 1.32* 1.42* 1.29*  CALCIUM 9.3   < > 9.1 9.7 9.3  GFRNONAA 59*   < > 57* 52* 58*  PROT 7.3  --   --   --   --   ALBUMIN 4.3  --   --   --   --   AST 24  --   --   --   --   ALT 21  --   --   --   --   ALKPHOS 92  --   --   --   --   BILITOT 2.5*  --   --   --   --    < > = values in this interval not displayed.     No results found.  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  Patient currently on Retacrit [since April 2021]; hemoglobin between 8-9.  Patient is fairly asymptomatic. STABLE.   # Discussed with the patient that if he gets more fatigue/symptomatic the options include  Luspatercept/Revlimid.  #Today hemoglobin is 8.7 STABLE  Continue Aranesp for now.  FEB 2022- Ferritin-900+  # Stage kidney disease-stage III-STABLE.   # # Vaccination counselling: Given, immunocompromise state/age recommend Flu shot [october1st]; and also recommend #2 booster.  # DISPOSITION:  # Retacrit today # in 2 week- lab- H&H; possible Retacrit # in 4 weeks- lab- H&H; possible Retacrit # in 6 weeks- lab- H&H; possible Retacrit # # in 8 weeks- MD; labs-cbc/bmp/ retacrit;-Dr.B  All questions were answered. The patient knows to call the clinic with any problems, questions or concerns.    Cammie Sickle, MD 08/10/2021 7:13 PM

## 2021-08-10 NOTE — Progress Notes (Signed)
Pt in for follow up and retacrit today.  Denies any concerns today.

## 2021-08-10 NOTE — Assessment & Plan Note (Addendum)
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  Patient currently on Retacrit [since April 2021]; hemoglobin between 8-9.  Patient is fairly asymptomatic. STABLE.   # Discussed with the patient that if he gets more fatigue/symptomatic the options include Luspatercept/Revlimid.  #Today hemoglobin is 8.7 STABLE  Continue Aranesp for now.  FEB 2022- Ferritin-900+  # Stage kidney disease-stage III-STABLE.   # # Vaccination counselling: Given, immunocompromise state/age recommend Flu shot [october1st]; and also recommend #2 booster.  # DISPOSITION:  # Retacrit today # in 2 week- lab- H&H; possible Retacrit # in 4 weeks- lab- H&H; possible Retacrit # in 6 weeks- lab- H&H; possible Retacrit # # in 8 weeks- MD; labs-cbc/bmp/ retacrit;-Dr.B

## 2021-08-24 ENCOUNTER — Inpatient Hospital Stay: Payer: Medicare Other

## 2021-08-24 VITALS — BP 117/66 | HR 52

## 2021-08-24 DIAGNOSIS — D4620 Refractory anemia with excess of blasts, unspecified: Secondary | ICD-10-CM

## 2021-08-24 DIAGNOSIS — D539 Nutritional anemia, unspecified: Secondary | ICD-10-CM

## 2021-08-24 DIAGNOSIS — D461 Refractory anemia with ring sideroblasts: Secondary | ICD-10-CM | POA: Diagnosis not present

## 2021-08-24 LAB — HEMATOCRIT: HCT: 26.3 % — ABNORMAL LOW (ref 39.0–52.0)

## 2021-08-24 LAB — HEMOGLOBIN: Hemoglobin: 8.4 g/dL — ABNORMAL LOW (ref 13.0–17.0)

## 2021-08-24 MED ORDER — EPOETIN ALFA-EPBX 10000 UNIT/ML IJ SOLN
20000.0000 [IU] | Freq: Once | INTRAMUSCULAR | Status: AC
  Administered 2021-08-24: 20000 [IU] via SUBCUTANEOUS
  Filled 2021-08-24: qty 2

## 2021-09-07 ENCOUNTER — Inpatient Hospital Stay: Payer: Medicare Other | Attending: Internal Medicine

## 2021-09-07 ENCOUNTER — Inpatient Hospital Stay: Payer: Medicare Other

## 2021-09-07 VITALS — BP 137/76 | HR 48

## 2021-09-07 DIAGNOSIS — D4620 Refractory anemia with excess of blasts, unspecified: Secondary | ICD-10-CM

## 2021-09-07 DIAGNOSIS — D539 Nutritional anemia, unspecified: Secondary | ICD-10-CM

## 2021-09-07 DIAGNOSIS — D461 Refractory anemia with ring sideroblasts: Secondary | ICD-10-CM | POA: Diagnosis present

## 2021-09-07 DIAGNOSIS — N183 Chronic kidney disease, stage 3 unspecified: Secondary | ICD-10-CM | POA: Diagnosis not present

## 2021-09-07 LAB — HEMOGLOBIN: Hemoglobin: 8.6 g/dL — ABNORMAL LOW (ref 13.0–17.0)

## 2021-09-07 LAB — HEMATOCRIT: HCT: 26.9 % — ABNORMAL LOW (ref 39.0–52.0)

## 2021-09-07 MED ORDER — EPOETIN ALFA-EPBX 10000 UNIT/ML IJ SOLN
20000.0000 [IU] | Freq: Once | INTRAMUSCULAR | Status: AC
  Administered 2021-09-07: 20000 [IU] via SUBCUTANEOUS
  Filled 2021-09-07: qty 2

## 2021-09-21 ENCOUNTER — Inpatient Hospital Stay: Payer: Medicare Other

## 2021-09-21 ENCOUNTER — Other Ambulatory Visit: Payer: Self-pay

## 2021-09-21 VITALS — BP 127/64 | HR 58

## 2021-09-21 DIAGNOSIS — D461 Refractory anemia with ring sideroblasts: Secondary | ICD-10-CM | POA: Diagnosis not present

## 2021-09-21 DIAGNOSIS — D4620 Refractory anemia with excess of blasts, unspecified: Secondary | ICD-10-CM

## 2021-09-21 DIAGNOSIS — D539 Nutritional anemia, unspecified: Secondary | ICD-10-CM

## 2021-09-21 LAB — HEMOGLOBIN: Hemoglobin: 8.9 g/dL — ABNORMAL LOW (ref 13.0–17.0)

## 2021-09-21 LAB — HEMATOCRIT: HCT: 27.9 % — ABNORMAL LOW (ref 39.0–52.0)

## 2021-09-21 MED ORDER — EPOETIN ALFA-EPBX 10000 UNIT/ML IJ SOLN
20000.0000 [IU] | Freq: Once | INTRAMUSCULAR | Status: AC
  Administered 2021-09-21: 20000 [IU] via SUBCUTANEOUS
  Filled 2021-09-21: qty 2

## 2021-10-09 ENCOUNTER — Inpatient Hospital Stay: Payer: Medicare Other | Attending: Internal Medicine

## 2021-10-09 ENCOUNTER — Inpatient Hospital Stay: Payer: Medicare Other

## 2021-10-09 ENCOUNTER — Inpatient Hospital Stay: Payer: Medicare Other | Admitting: Internal Medicine

## 2021-10-09 ENCOUNTER — Encounter: Payer: Self-pay | Admitting: Internal Medicine

## 2021-10-09 ENCOUNTER — Other Ambulatory Visit: Payer: Self-pay

## 2021-10-09 DIAGNOSIS — D4620 Refractory anemia with excess of blasts, unspecified: Secondary | ICD-10-CM | POA: Diagnosis not present

## 2021-10-09 DIAGNOSIS — N183 Chronic kidney disease, stage 3 unspecified: Secondary | ICD-10-CM | POA: Insufficient documentation

## 2021-10-09 DIAGNOSIS — D461 Refractory anemia with ring sideroblasts: Secondary | ICD-10-CM | POA: Insufficient documentation

## 2021-10-09 LAB — CBC WITH DIFFERENTIAL/PLATELET
Abs Immature Granulocytes: 0.02 10*3/uL (ref 0.00–0.07)
Basophils Absolute: 0 10*3/uL (ref 0.0–0.1)
Basophils Relative: 1 %
Eosinophils Absolute: 0.2 10*3/uL (ref 0.0–0.5)
Eosinophils Relative: 3 %
HCT: 27.9 % — ABNORMAL LOW (ref 39.0–52.0)
Hemoglobin: 9.1 g/dL — ABNORMAL LOW (ref 13.0–17.0)
Immature Granulocytes: 0 %
Lymphocytes Relative: 28 %
Lymphs Abs: 1.6 10*3/uL (ref 0.7–4.0)
MCH: 32.5 pg (ref 26.0–34.0)
MCHC: 32.6 g/dL (ref 30.0–36.0)
MCV: 99.6 fL (ref 80.0–100.0)
Monocytes Absolute: 0.4 10*3/uL (ref 0.1–1.0)
Monocytes Relative: 6 %
Neutro Abs: 3.5 10*3/uL (ref 1.7–7.7)
Neutrophils Relative %: 62 %
Platelets: 186 10*3/uL (ref 150–400)
RBC: 2.8 MIL/uL — ABNORMAL LOW (ref 4.22–5.81)
RDW: 29.2 % — ABNORMAL HIGH (ref 11.5–15.5)
WBC: 5.6 10*3/uL (ref 4.0–10.5)
nRBC: 0.4 % — ABNORMAL HIGH (ref 0.0–0.2)

## 2021-10-09 LAB — BASIC METABOLIC PANEL
Anion gap: 6 (ref 5–15)
BUN: 33 mg/dL — ABNORMAL HIGH (ref 8–23)
CO2: 24 mmol/L (ref 22–32)
Calcium: 9.4 mg/dL (ref 8.9–10.3)
Chloride: 108 mmol/L (ref 98–111)
Creatinine, Ser: 1.39 mg/dL — ABNORMAL HIGH (ref 0.61–1.24)
GFR, Estimated: 53 mL/min — ABNORMAL LOW (ref >60)
Glucose, Bld: 142 mg/dL — ABNORMAL HIGH (ref 70–99)
Potassium: 4.8 mmol/L (ref 3.5–5.1)
Sodium: 138 mmol/L (ref 135–145)

## 2021-10-09 MED ORDER — EPOETIN ALFA-EPBX 10000 UNIT/ML IJ SOLN
20000.0000 [IU] | Freq: Once | INTRAMUSCULAR | Status: AC
  Administered 2021-10-09: 20000 [IU] via SUBCUTANEOUS
  Filled 2021-10-09: qty 2

## 2021-10-09 NOTE — Progress Notes (Signed)
[facility] NOTE  Patient Care Team: Baxter Hire, MD as PCP - General (Internal Medicine) Cammie Sickle, MD as Consulting Physician (Hematology and Oncology)  CHIEF COMPLAINTS/PURPOSE OF CONSULTATION: MDS  Oncology History Overview Note  # EGD-none; colonoscopy-never [Cologurad x2- NEG];FEB 2021- US- abdomen-no liver disease/ Mild splenic enlargement [460cc; kidney cysts]; J19 folic acid/LDH haptoglobin normal.  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   # NGS/MOLECULAR TESTS:[facility] ON hem- May 2021- Cux-1; DNMT3A; SF3B**  # PALLIATIVE CARE EVALUATION: NA   DIAGNOSIS: MDS low risk   GOALS: Control  CURRENT/MOST RECENT THERAPY : Retacrit    MDS (myelodysplastic syndrome), low grade ([place])  02/18/2020 Initial Diagnosis   MDS (myelodysplastic syndrome), low grade (HCC)       HISTORY OF PRESENTING ILLNESS: Ambulating independently.  Alone.  Vernon [name] 75 y.o.  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  Patient denies any worsening fatigue.  Denies any shortness of breath or cough.  Has not needed transfusions.   Review of Systems  Constitutional:  Positive for malaise/fatigue. Negative for chills, diaphoresis, fever and weight loss.  HENT:  Negative for nosebleeds and sore throat.   Eyes:  Negative for double vision.  Respiratory:  Negative for cough, hemoptysis, sputum production, shortness of breath and wheezing.   Cardiovascular:  Negative for chest pain, palpitations, orthopnea and leg swelling.  Gastrointestinal:  Negative for abdominal pain, blood in stool, constipation,  diarrhea, heartburn, melena, nausea and vomiting.  Genitourinary:  Negative for dysuria, frequency and urgency.  Musculoskeletal:  Negative for back pain and joint pain.  Skin: Negative.  Negative for itching and rash.  Neurological:  Negative for dizziness, tingling, focal weakness, weakness and headaches.  Endo/Heme/Allergies:  Does not bruise/bleed easily.  Psychiatric/Behavioral:  Negative for depression. The patient is not nervous/anxious and does not have insomnia.    MEDICAL HISTORY:  Past Medical History:  Diagnosis Date   Anemia    Arthritis    Complication of anesthesia    hard time getting bp up after knee replacement   Diabetes mellitus without complication (HCC)    GERD (gastroesophageal reflux disease)    occ tums prn   History of hiatal hernia    Hypertension     SURGICAL HISTORY: Past Surgical History:  Procedure Laterality Date   CARPAL TUNNEL RELEASE  2012   JOINT REPLACEMENT Right 2010   SHOULDER ARTHROSCOPY WITH ROTATOR CUFF REPAIR AND OPEN BICEPS TENODESIS Right 11/09/2019   Procedure: RIGHT SHOULDER ARTHROSCOPY WITH SUBSCAPULARIS REPAIR, SUBACROMIAL DECOMPRESSION,MINI OPEN ROTATOR CUFF REPAIR;  Surgeon: Leim Fabry, MD;  Location: [facility] ORS;  Service: Orthopedics;  Laterality: Right;    SOCIAL HISTORY: Social History   Socioeconomic History   Marital status: Married    Spouse name: Not on file   Number of children: Not on file   Years of education: Not on file   Highest education level: Not on file  Occupational History   Not on file  Tobacco Use   Smoking status: Former    Packs/day: 0.50    Years: 8.00    Pack years: 4.00    Types: Cigarettes    Quit date: 07/21/1978    Years since  quitting: 43.2   Smokeless tobacco: Never  Substance and Sexual Activity   Alcohol use: Yes    Alcohol/week: 0.0 - 1.0 standard drinks    Comment: rare beer   Drug use: Never   Sexual activity: Not on file  Other Topics Concern   Not on file  Social History  Narrative   Lives in [place]; with wife; quit smoking in early 53s; ocassional/ rare [may be 1 a month beer]. 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    Social Determinants of Health   Financial Resource Strain: Not on file  Food Insecurity: Not on file  Transportation Needs: Not on file  Physical Activity: Not on file  Stress: Not on file  Social Connections: Not on file  Intimate Partner Violence: Not on file    FAMILY HISTORY: Family History  Problem Relation Age of Onset   Cancer Maternal Uncle     ALLERGIES:  has No Known Allergies.  MEDICATIONS:  Current Outpatient Medications  Medication Sig Dispense Refill   acetaminophen (TYLENOL) 650 MG CR tablet Take 650 mg by mouth every 8 (eight) hours as needed for pain.     allopurinol (ZYLOPRIM) 300 MG tablet Take 300 mg by mouth at bedtime.      apixaban (ELIQUIS) 5 MG TABS tablet Take 5 mg by mouth 2 (two) times daily.      blood glucose meter kit and supplies Use 1 kit as needed.     Cyanocobalamin (VITAMIN B 12) 500 MCG TABS Take by mouth.     ferrous sulfate 325 (65 FE) MG EC tablet Take 325 mg by mouth 2 (two) times daily. Patient reports takes once a day     lisinopril (ZESTRIL) 40 MG tablet Take 40 mg by mouth at bedtime.      metFORMIN (GLUCOPHAGE) 500 MG tablet Take 500 mg by mouth 2 (two) times daily with a meal.      Multiple Vitamins-Minerals (CENTRUM SILVER PO) Take 1 tablet by mouth daily.     simvastatin (ZOCOR) 80 MG tablet Take 40 mg by mouth daily at 6 PM.      No current facility-administered medications for this visit.      PHYSICAL EXAMINATION:   Vitals:   10/09/21 1019  BP: (!) 153/64  Pulse: (!) 58  Resp: 18  Temp: 97.7 F (36.5 C)   Filed Weights   10/09/21 1019  Weight: 220 lb 3.2 oz (99.9 kg)    Physical Exam HENT:     Head: Normocephalic and atraumatic.     Mouth/Throat:     Pharynx: No oropharyngeal exudate.  Eyes:     Pupils: Pupils are equal, round, and reactive to  light.  Cardiovascular:     Rate and Rhythm: Normal rate and regular rhythm.  Pulmonary:     Effort: Pulmonary effort is normal. No respiratory distress.     Breath sounds: Normal breath sounds. No wheezing.  Abdominal:     General: Bowel sounds are normal. There is no distension.     Palpations: Abdomen is soft. There is no mass.     Tenderness: There is no abdominal tenderness. There is no guarding or rebound.  Musculoskeletal:        General: No tenderness. Normal range of motion.     Cervical back: Normal range of motion and neck supple.  Skin:    General: Skin is warm.  Neurological:     Mental Status: He is alert and oriented to person, place, and time.  Psychiatric:        Mood and Affect: Affect normal.   LABORATORY DATA:  I have reviewed the data as listed Lab Results  Component Value Date   WBC 5.6 10/09/2021   HGB 9.1 (L) 10/09/2021   HCT 27.9 (L) 10/09/2021   MCV 99.6 10/09/2021   PLT 186 10/09/2021   Recent Labs    12/29/20 0930 02/23/21 0912 06/15/21 1001 08/10/21 1019 10/09/21 0945  NA 141   < > 138 136 138  K 4.9   < > 5.0 5.1 4.8  CL 111   < > 109 107 108  CO2 22   < > $R'22 23 24  'rQ$ GLUCOSE 149*   < > 192* 143* 142*  BUN 32*   < > 29* 31* 33*  CREATININE 1.27*   < > 1.42* 1.29* 1.39*  CALCIUM 9.3   < > 9.7 9.3 9.4  GFRNONAA 59*   < > 52* 58* 53*  PROT 7.3  --   --   --   --   ALBUMIN 4.3  --   --   --   --   AST 24  --   --   --   --   ALT 21  --   --   --   --   ALKPHOS 92  --   --   --   --   BILITOT 2.5*  --   --   --   --    < > = values in this interval not displayed.     No results found.  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  Patient currently on Retacrit [since April 2021]; hemoglobin between 8-9.  Patient is fairly asymptomatic. STABLE.   # Discussed with the patient that if he gets more fatigue/symptomatic the options include  Luspatercept/Revlimid.  #Today hemoglobin is 9.2- STABLE. Continue Aranesp for now.  FEB 2022- Ferritin-900+  # Stage kidney disease-stage III-STABLE. .  # DISPOSITION:  # Retacrit today # in 2 week- lab- H&H; possible Retacrit # in 4 weeks- lab- H&H; possible Retacrit # in 6 weeks- lab- H&H; possible Retacrit # in 8 weeks- lab- H&H; possible Retacrit # # in 12  weeks- MD; labs-cbc/bmp/ retacrit;-Dr.B  All questions were answered. The patient knows to call the clinic with any problems, questions or concerns.    Cammie Sickle, MD 10/09/2021 11:40 AM

## 2021-10-09 NOTE — Progress Notes (Signed)
Patient denies new problems/concerns today.   °

## 2021-10-09 NOTE — Assessment & Plan Note (Addendum)
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  Patient currently on Retacrit [since April 2021]; hemoglobin between 8-9.  Patient is fairly asymptomatic. STABLE.   # Discussed with the patient that if he gets more fatigue/symptomatic the options include Luspatercept/Revlimid.  #Today hemoglobin is 9.2- STABLE. Continue Aranesp for now.  FEB 2022- Ferritin-900+  # Stage kidney disease-stage III-STABLE. .  # DISPOSITION:  # Retacrit today # in 2 week- lab- H&H; possible Retacrit # in 4 weeks- lab- H&H; possible Retacrit # in 6 weeks- lab- H&H; possible Retacrit # in 8 weeks- lab- H&H; possible Retacrit # # in 12  weeks- MD; labs-cbc/bmp/ retacrit;-Dr.B

## 2021-10-23 ENCOUNTER — Inpatient Hospital Stay: Payer: Medicare Other

## 2021-10-23 ENCOUNTER — Other Ambulatory Visit: Payer: Self-pay

## 2021-10-23 VITALS — BP 160/73 | HR 73

## 2021-10-23 DIAGNOSIS — D4620 Refractory anemia with excess of blasts, unspecified: Secondary | ICD-10-CM

## 2021-10-23 DIAGNOSIS — D461 Refractory anemia with ring sideroblasts: Secondary | ICD-10-CM | POA: Diagnosis not present

## 2021-10-23 DIAGNOSIS — D539 Nutritional anemia, unspecified: Secondary | ICD-10-CM

## 2021-10-23 LAB — HEMATOCRIT: HCT: 28.7 % — ABNORMAL LOW (ref 39.0–52.0)

## 2021-10-23 LAB — HEMOGLOBIN: Hemoglobin: 9.3 g/dL — ABNORMAL LOW (ref 13.0–17.0)

## 2021-10-23 MED ORDER — EPOETIN ALFA-EPBX 10000 UNIT/ML IJ SOLN
20000.0000 [IU] | Freq: Once | INTRAMUSCULAR | Status: AC
  Administered 2021-10-23: 11:00:00 20000 [IU] via SUBCUTANEOUS
  Filled 2021-10-23: qty 2

## 2021-11-06 ENCOUNTER — Inpatient Hospital Stay: Payer: Medicare Other

## 2021-11-06 ENCOUNTER — Other Ambulatory Visit: Payer: Self-pay

## 2021-11-06 ENCOUNTER — Inpatient Hospital Stay: Payer: Medicare Other | Attending: Internal Medicine

## 2021-11-06 VITALS — BP 129/68 | HR 54

## 2021-11-06 DIAGNOSIS — D539 Nutritional anemia, unspecified: Secondary | ICD-10-CM

## 2021-11-06 DIAGNOSIS — D461 Refractory anemia with ring sideroblasts: Secondary | ICD-10-CM | POA: Diagnosis present

## 2021-11-06 DIAGNOSIS — D4620 Refractory anemia with excess of blasts, unspecified: Secondary | ICD-10-CM

## 2021-11-06 LAB — HEMOGLOBIN: Hemoglobin: 9 g/dL — ABNORMAL LOW (ref 13.0–17.0)

## 2021-11-06 LAB — HEMATOCRIT: HCT: 27.6 % — ABNORMAL LOW (ref 39.0–52.0)

## 2021-11-06 MED ORDER — EPOETIN ALFA-EPBX 10000 UNIT/ML IJ SOLN
20000.0000 [IU] | Freq: Once | INTRAMUSCULAR | Status: AC
  Administered 2021-11-06: 20000 [IU] via SUBCUTANEOUS
  Filled 2021-11-06: qty 2

## 2021-11-21 ENCOUNTER — Other Ambulatory Visit: Payer: Self-pay

## 2021-11-21 ENCOUNTER — Inpatient Hospital Stay: Payer: Medicare Other | Attending: Internal Medicine

## 2021-11-21 ENCOUNTER — Inpatient Hospital Stay: Payer: Medicare Other

## 2021-11-21 VITALS — BP 131/77 | HR 62

## 2021-11-21 DIAGNOSIS — D461 Refractory anemia with ring sideroblasts: Secondary | ICD-10-CM | POA: Diagnosis present

## 2021-11-21 DIAGNOSIS — D4620 Refractory anemia with excess of blasts, unspecified: Secondary | ICD-10-CM

## 2021-11-21 DIAGNOSIS — D539 Nutritional anemia, unspecified: Secondary | ICD-10-CM

## 2021-11-21 LAB — HEMATOCRIT: HCT: 28 % — ABNORMAL LOW (ref 39.0–52.0)

## 2021-11-21 LAB — HEMOGLOBIN: Hemoglobin: 9.1 g/dL — ABNORMAL LOW (ref 13.0–17.0)

## 2021-11-21 MED ORDER — EPOETIN ALFA-EPBX 10000 UNIT/ML IJ SOLN
20000.0000 [IU] | Freq: Once | INTRAMUSCULAR | Status: AC
  Administered 2021-11-21: 11:00:00 20000 [IU] via SUBCUTANEOUS
  Filled 2021-11-21: qty 2

## 2021-11-29 ENCOUNTER — Other Ambulatory Visit: Payer: Self-pay | Admitting: *Deleted

## 2021-11-29 DIAGNOSIS — D4620 Refractory anemia with excess of blasts, unspecified: Secondary | ICD-10-CM

## 2021-12-04 ENCOUNTER — Other Ambulatory Visit: Payer: Self-pay

## 2021-12-04 ENCOUNTER — Inpatient Hospital Stay: Payer: Medicare Other

## 2021-12-04 ENCOUNTER — Inpatient Hospital Stay: Payer: Medicare Other | Attending: Internal Medicine

## 2021-12-04 VITALS — BP 168/78 | HR 65

## 2021-12-04 DIAGNOSIS — D4620 Refractory anemia with excess of blasts, unspecified: Secondary | ICD-10-CM

## 2021-12-04 DIAGNOSIS — N183 Chronic kidney disease, stage 3 unspecified: Secondary | ICD-10-CM | POA: Diagnosis not present

## 2021-12-04 DIAGNOSIS — D461 Refractory anemia with ring sideroblasts: Secondary | ICD-10-CM | POA: Insufficient documentation

## 2021-12-04 DIAGNOSIS — D539 Nutritional anemia, unspecified: Secondary | ICD-10-CM

## 2021-12-04 LAB — HEMOGLOBIN: Hemoglobin: 9.2 g/dL — ABNORMAL LOW (ref 13.0–17.0)

## 2021-12-04 LAB — HEMATOCRIT: HCT: 28.8 % — ABNORMAL LOW (ref 39.0–52.0)

## 2021-12-04 MED ORDER — EPOETIN ALFA-EPBX 10000 UNIT/ML IJ SOLN
20000.0000 [IU] | Freq: Once | INTRAMUSCULAR | Status: AC
  Administered 2021-12-04: 20000 [IU] via SUBCUTANEOUS
  Filled 2021-12-04: qty 2

## 2021-12-18 ENCOUNTER — Encounter: Payer: Self-pay | Admitting: Internal Medicine

## 2021-12-18 ENCOUNTER — Inpatient Hospital Stay: Payer: Medicare Other

## 2021-12-18 ENCOUNTER — Inpatient Hospital Stay: Payer: Medicare Other | Admitting: Internal Medicine

## 2021-12-18 ENCOUNTER — Other Ambulatory Visit: Payer: Self-pay

## 2021-12-18 DIAGNOSIS — D4620 Refractory anemia with excess of blasts, unspecified: Secondary | ICD-10-CM | POA: Diagnosis not present

## 2021-12-18 DIAGNOSIS — D461 Refractory anemia with ring sideroblasts: Secondary | ICD-10-CM | POA: Diagnosis not present

## 2021-12-18 LAB — CBC WITH DIFFERENTIAL/PLATELET
Abs Immature Granulocytes: 0.03 10*3/uL (ref 0.00–0.07)
Basophils Absolute: 0 10*3/uL (ref 0.0–0.1)
Basophils Relative: 1 %
Eosinophils Absolute: 0.3 10*3/uL (ref 0.0–0.5)
Eosinophils Relative: 4 %
HCT: 29.5 % — ABNORMAL LOW (ref 39.0–52.0)
Hemoglobin: 9.5 g/dL — ABNORMAL LOW (ref 13.0–17.0)
Immature Granulocytes: 1 %
Lymphocytes Relative: 22 %
Lymphs Abs: 1.5 10*3/uL (ref 0.7–4.0)
MCH: 32.2 pg (ref 26.0–34.0)
MCHC: 32.2 g/dL (ref 30.0–36.0)
MCV: 100 fL (ref 80.0–100.0)
Monocytes Absolute: 0.5 10*3/uL (ref 0.1–1.0)
Monocytes Relative: 7 %
Neutro Abs: 4.4 10*3/uL (ref 1.7–7.7)
Neutrophils Relative %: 65 %
Platelets: 230 10*3/uL (ref 150–400)
RBC: 2.95 MIL/uL — ABNORMAL LOW (ref 4.22–5.81)
RDW: 28.3 % — ABNORMAL HIGH (ref 11.5–15.5)
WBC: 6.7 10*3/uL (ref 4.0–10.5)
nRBC: 0.3 % — ABNORMAL HIGH (ref 0.0–0.2)

## 2021-12-18 LAB — BASIC METABOLIC PANEL
Anion gap: 6 (ref 5–15)
BUN: 35 mg/dL — ABNORMAL HIGH (ref 8–23)
CO2: 24 mmol/L (ref 22–32)
Calcium: 9.7 mg/dL (ref 8.9–10.3)
Chloride: 109 mmol/L (ref 98–111)
Creatinine, Ser: 1.29 mg/dL — ABNORMAL HIGH (ref 0.61–1.24)
GFR, Estimated: 58 mL/min — ABNORMAL LOW (ref >60)
Glucose, Bld: 157 mg/dL — ABNORMAL HIGH (ref 70–99)
Potassium: 4.6 mmol/L (ref 3.5–5.1)
Sodium: 139 mmol/L (ref 135–145)

## 2021-12-18 MED ORDER — EPOETIN ALFA-EPBX 40000 UNIT/ML IJ SOLN
20000.0000 [IU] | Freq: Once | INTRAMUSCULAR | Status: DC
  Filled 2021-12-18: qty 1

## 2021-12-18 MED ORDER — EPOETIN ALFA-EPBX 10000 UNIT/ML IJ SOLN
20000.0000 [IU] | Freq: Once | INTRAMUSCULAR | Status: AC
  Administered 2021-12-18: 20000 [IU] via SUBCUTANEOUS
  Filled 2021-12-18: qty 2

## 2021-12-18 NOTE — Progress Notes (Signed)
[facility] CONSULT NOTE  Patient Care Team: Gracelyn Nurse, MD as PCP - General (Internal Medicine) Earna Coder, MD as Consulting Physician (Hematology and Oncology)  CHIEF COMPLAINTS/PURPOSE OF CONSULTATION: MDS  Oncology History Overview Note  # EGD-none; colonoscopy-never [Cologurad x2- NEG];FEB 2021- US- abdomen-no liver disease/ Mild splenic enlargement [460cc; kidney cysts]; B12 folic acid/LDH haptoglobin normal.  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   # NGS/MOLECULAR TESTS:[facility] ON hem- May 2021- Cux-1; DNMT3A; SF3B**  # PALLIATIVE CARE EVALUATION: NA   DIAGNOSIS: MDS low risk   GOALS: Control  CURRENT/MOST RECENT THERAPY : Retacrit    MDS (myelodysplastic syndrome), low grade (HCC)  02/18/2020 Initial Diagnosis   MDS (myelodysplastic syndrome), low grade (HCC)       HISTORY OF PRESENTING ILLNESS: Ambulating independently.  Alone.  Vernon [name] 76 y.o.  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  Denies any worsening fatigue.  Denies any nausea vomiting abdominal pain.  No weight loss.  No worsening shortness of breath or cough.  Review of Systems  Constitutional:  Positive for malaise/fatigue. Negative for chills, diaphoresis, fever and weight loss.  HENT:  Negative for nosebleeds and sore throat.   Eyes:  Negative for double vision.  Respiratory:  Negative for cough, hemoptysis, sputum production, shortness of breath and wheezing.   Cardiovascular:  Negative for chest pain, palpitations, orthopnea and leg swelling.  Gastrointestinal:  Negative for abdominal pain, blood in  stool, constipation, diarrhea, heartburn, melena, nausea and vomiting.  Genitourinary:  Negative for dysuria, frequency and urgency.  Musculoskeletal:  Negative for back pain and joint pain.  Skin: Negative.  Negative for itching and rash.  Neurological:  Negative for dizziness, tingling, focal weakness, weakness and headaches.  Endo/Heme/Allergies:  Does not bruise/bleed easily.  Psychiatric/Behavioral:  Negative for depression. The patient is not nervous/anxious and does not have insomnia.    MEDICAL HISTORY:  Past Medical History:  Diagnosis Date   Anemia    Arthritis    Complication of anesthesia    hard time getting bp up after knee replacement   Diabetes mellitus without complication (HCC)    GERD (gastroesophageal reflux disease)    occ tums prn   History of hiatal hernia    Hypertension     SURGICAL HISTORY: Past Surgical History:  Procedure Laterality Date   CARPAL TUNNEL RELEASE  2012   JOINT REPLACEMENT Right 2010   SHOULDER ARTHROSCOPY WITH ROTATOR CUFF REPAIR AND OPEN BICEPS TENODESIS Right 11/09/2019   Procedure: RIGHT SHOULDER ARTHROSCOPY WITH SUBSCAPULARIS REPAIR, SUBACROMIAL DECOMPRESSION,MINI OPEN ROTATOR CUFF REPAIR;  Surgeon: Signa Kell, MD;  Location: [facility] ORS;  Service: Orthopedics;  Laterality: Right;    SOCIAL HISTORY: Social History   Socioeconomic History   Marital status: Married    Spouse name: Not on file   Number of children: Not on file   Years of education: Not on file   Highest education level: Not on file  Occupational History   Not on file  Tobacco Use   Smoking status: Former    Packs/day: 0.50    Years: 8.00    Pack years: 4.00    Types: Cigarettes    Quit date: 07/21/1978  Years since quitting: 43.4   Smokeless tobacco: Never  Substance and Sexual Activity   Alcohol use: Yes    Alcohol/week: 0.0 - 1.0 standard drinks    Comment: rare beer   Drug use: Never   Sexual activity: Not on file  Other Topics Concern   Not on  file  Social History Narrative   Lives in [place]; with wife; quit smoking in early 28s; ocassional/ rare [may be 1 a month beer]. 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    Social Determinants of Health   Financial Resource Strain: Not on file  Food Insecurity: Not on file  Transportation Needs: Not on file  Physical Activity: Not on file  Stress: Not on file  Social Connections: Not on file  Intimate Partner Violence: Not on file    FAMILY HISTORY: Family History  Problem Relation Age of Onset   Cancer Maternal Uncle     ALLERGIES:  has No Known Allergies.  MEDICATIONS:  Current Outpatient Medications  Medication Sig Dispense Refill   acetaminophen (TYLENOL) 650 MG CR tablet Take 650 mg by mouth every 8 (eight) hours as needed for pain.     allopurinol (ZYLOPRIM) 300 MG tablet Take 300 mg by mouth at bedtime.      apixaban (ELIQUIS) 5 MG TABS tablet Take 5 mg by mouth 2 (two) times daily.      blood glucose meter kit and supplies Use 1 kit as needed.     Cyanocobalamin (VITAMIN B 12) 500 MCG TABS Take by mouth.     ferrous sulfate 325 (65 FE) MG EC tablet Take 325 mg by mouth 2 (two) times daily. Patient reports takes once a day     lisinopril (ZESTRIL) 40 MG tablet Take 40 mg by mouth at bedtime.      metFORMIN (GLUCOPHAGE) 500 MG tablet Take 500 mg by mouth 2 (two) times daily with a meal.      Multiple Vitamins-Minerals (CENTRUM SILVER PO) Take 1 tablet by mouth daily.     simvastatin (ZOCOR) 80 MG tablet Take 40 mg by mouth daily at 6 PM.      No current facility-administered medications for this visit.      PHYSICAL EXAMINATION:   Vitals:   12/18/21 0949  BP: 140/71  Pulse: 62  Temp: 97.6 F (36.4 C)   Filed Weights   12/18/21 0949  Weight: 215 lb 3.2 oz (97.6 kg)    Physical Exam HENT:     Head: Normocephalic and atraumatic.     Mouth/Throat:     Pharynx: No oropharyngeal exudate.  Eyes:     Pupils: Pupils are equal, round, and reactive to  light.  Cardiovascular:     Rate and Rhythm: Normal rate and regular rhythm.  Pulmonary:     Effort: Pulmonary effort is normal. No respiratory distress.     Breath sounds: Normal breath sounds. No wheezing.  Abdominal:     General: Bowel sounds are normal. There is no distension.     Palpations: Abdomen is soft. There is no mass.     Tenderness: There is no abdominal tenderness. There is no guarding or rebound.  Musculoskeletal:        General: No tenderness. Normal range of motion.     Cervical back: Normal range of motion and neck supple.  Skin:    General: Skin is warm.  Neurological:     Mental Status: He is alert and oriented to person, place, and time.  Psychiatric:  Mood and Affect: Affect normal.   LABORATORY DATA:  I have reviewed the data as listed Lab Results  Component Value Date   WBC 6.7 12/18/2021   HGB 9.5 (L) 12/18/2021   HCT 29.5 (L) 12/18/2021   MCV 100.0 12/18/2021   PLT 230 12/18/2021   Recent Labs    12/29/20 0930 02/23/21 0912 08/10/21 1019 10/09/21 0945 12/18/21 0910  NA 141   < > 136 138 139  K 4.9   < > 5.1 4.8 4.6  CL 111   < > 107 108 109  CO2 22   < > $R'23 24 24  'xc$ GLUCOSE 149*   < > 143* 142* 157*  BUN 32*   < > 31* 33* 35*  CREATININE 1.27*   < > 1.29* 1.39* 1.29*  CALCIUM 9.3   < > 9.3 9.4 9.7  GFRNONAA 59*   < > 58* 53* 58*  PROT 7.3  --   --   --   --   ALBUMIN 4.3  --   --   --   --   AST 24  --   --   --   --   ALT 21  --   --   --   --   ALKPHOS 92  --   --   --   --   BILITOT 2.5*  --   --   --   --    < > = values in this interval not displayed.     No results found.  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  Patient currently on Retacrit [since April 2021]; hemoglobin between 8-9.  Patient is fairly asymptomatic. STABLE.  Continue Retacrit every 2 weeks.  # Discussed with the patient that if he gets more  fatigue/symptomatic the options include Luspatercept/Revlimid.  #Today hemoglobin is 9.2- STABLE. Continue Aranesp for now.  FEB 2022- Ferritin-900; will recheck iron studies in 3 months.  # Stage kidney disease-stage III-STABLE  # DISPOSITION:  # Retacrit today # in 2 week- lab- H&H; possible Retacrit # in 4 weeks- lab- H&H; possible Retacrit # in 6 weeks- lab- H&H; possible Retacrit # in 8 weeks- lab- H&H; possible Retacrit # # in 12  weeks- MD; labs-cbc/bmp/ retacrit;iron studies/ferritin; B12 levels-Dr.B  All questions were answered. The patient knows to call the clinic with any problems, questions or concerns.    Cammie Sickle, MD 12/18/2021 11:27 AM

## 2021-12-18 NOTE — Assessment & Plan Note (Addendum)
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  Patient currently on Retacrit [since April 2021]; hemoglobin between 8-9.  Patient is fairly asymptomatic. STABLE.  Continue Retacrit every 2 weeks.  # Discussed with the patient that if he gets more fatigue/symptomatic the options include Luspatercept/Revlimid.  #Today hemoglobin is 9.2- STABLE. Continue Aranesp for now.  FEB 2022- Ferritin-900; [recently started Fe/B12] will recheck iron studies/B12 in 3 months.  # Stage kidney disease-stage III-STABLE  # DISPOSITION:  # Retacrit today # in 2 week- lab- H&H; possible Retacrit # in 4 weeks- lab- H&H; possible Retacrit # in 6 weeks- lab- H&H; possible Retacrit # in 8 weeks- lab- H&H; possible Retacrit # # in 12  weeks- MD; labs-cbc/bmp/ retacrit;iron studies/ferritin; B12 levels-Dr.B

## 2021-12-26 ENCOUNTER — Other Ambulatory Visit: Payer: Self-pay | Admitting: *Deleted

## 2021-12-26 DIAGNOSIS — D4620 Refractory anemia with excess of blasts, unspecified: Secondary | ICD-10-CM

## 2021-12-26 DIAGNOSIS — D539 Nutritional anemia, unspecified: Secondary | ICD-10-CM

## 2022-01-01 ENCOUNTER — Inpatient Hospital Stay: Payer: Medicare Other | Attending: Internal Medicine

## 2022-01-01 ENCOUNTER — Other Ambulatory Visit: Payer: Self-pay

## 2022-01-01 ENCOUNTER — Inpatient Hospital Stay: Payer: Medicare Other

## 2022-01-01 VITALS — BP 148/82 | HR 82

## 2022-01-01 DIAGNOSIS — D4620 Refractory anemia with excess of blasts, unspecified: Secondary | ICD-10-CM

## 2022-01-01 DIAGNOSIS — D539 Nutritional anemia, unspecified: Secondary | ICD-10-CM

## 2022-01-01 DIAGNOSIS — D461 Refractory anemia with ring sideroblasts: Secondary | ICD-10-CM | POA: Diagnosis present

## 2022-01-01 LAB — HEMATOCRIT: HCT: 30.6 % — ABNORMAL LOW (ref 39.0–52.0)

## 2022-01-01 LAB — HEMOGLOBIN: Hemoglobin: 9.9 g/dL — ABNORMAL LOW (ref 13.0–17.0)

## 2022-01-01 MED ORDER — EPOETIN ALFA-EPBX 10000 UNIT/ML IJ SOLN
20000.0000 [IU] | Freq: Once | INTRAMUSCULAR | Status: AC
  Administered 2022-01-01: 20000 [IU] via SUBCUTANEOUS
  Filled 2022-01-01: qty 2

## 2022-01-15 ENCOUNTER — Inpatient Hospital Stay: Payer: Medicare Other

## 2022-01-15 ENCOUNTER — Other Ambulatory Visit: Payer: Self-pay

## 2022-01-15 VITALS — BP 147/77 | HR 60

## 2022-01-15 DIAGNOSIS — D539 Nutritional anemia, unspecified: Secondary | ICD-10-CM

## 2022-01-15 DIAGNOSIS — D461 Refractory anemia with ring sideroblasts: Secondary | ICD-10-CM | POA: Diagnosis not present

## 2022-01-15 DIAGNOSIS — D4620 Refractory anemia with excess of blasts, unspecified: Secondary | ICD-10-CM

## 2022-01-15 LAB — HEMOGLOBIN: Hemoglobin: 9.6 g/dL — ABNORMAL LOW (ref 13.0–17.0)

## 2022-01-15 LAB — HEMATOCRIT: HCT: 30.2 % — ABNORMAL LOW (ref 39.0–52.0)

## 2022-01-15 MED ORDER — EPOETIN ALFA-EPBX 10000 UNIT/ML IJ SOLN
20000.0000 [IU] | Freq: Once | INTRAMUSCULAR | Status: AC
  Administered 2022-01-15: 20000 [IU] via SUBCUTANEOUS
  Filled 2022-01-15: qty 2

## 2022-01-29 ENCOUNTER — Other Ambulatory Visit: Payer: Self-pay

## 2022-01-29 ENCOUNTER — Inpatient Hospital Stay: Payer: Medicare Other

## 2022-01-29 ENCOUNTER — Inpatient Hospital Stay: Payer: Medicare Other | Attending: Internal Medicine

## 2022-01-29 VITALS — BP 135/70 | HR 65

## 2022-01-29 DIAGNOSIS — D539 Nutritional anemia, unspecified: Secondary | ICD-10-CM

## 2022-01-29 DIAGNOSIS — D461 Refractory anemia with ring sideroblasts: Secondary | ICD-10-CM | POA: Insufficient documentation

## 2022-01-29 DIAGNOSIS — D4620 Refractory anemia with excess of blasts, unspecified: Secondary | ICD-10-CM

## 2022-01-29 LAB — HEMOGLOBIN: Hemoglobin: 9 g/dL — ABNORMAL LOW (ref 13.0–17.0)

## 2022-01-29 LAB — HEMATOCRIT: HCT: 28.4 % — ABNORMAL LOW (ref 39.0–52.0)

## 2022-01-29 MED ORDER — EPOETIN ALFA-EPBX 10000 UNIT/ML IJ SOLN
20000.0000 [IU] | Freq: Once | INTRAMUSCULAR | Status: AC
  Administered 2022-01-29: 20000 [IU] via SUBCUTANEOUS
  Filled 2022-01-29: qty 2

## 2022-02-10 ENCOUNTER — Emergency Department: Payer: Medicare Other

## 2022-02-10 ENCOUNTER — Inpatient Hospital Stay (HOSPITAL_COMMUNITY)
Admission: EM | Admit: 2022-02-10 | Discharge: 2022-02-26 | DRG: 023 | Disposition: A | Discharge status: Rehabilitation Facility | Payer: Medicare Other | Source: Other Acute Inpatient Hospital | Attending: Internal Medicine | Admitting: Internal Medicine

## 2022-02-10 ENCOUNTER — Inpatient Hospital Stay (HOSPITAL_COMMUNITY): Payer: Medicare Other | Admitting: Anesthesiology

## 2022-02-10 ENCOUNTER — Inpatient Hospital Stay (HOSPITAL_COMMUNITY): Payer: Medicare Other

## 2022-02-10 ENCOUNTER — Encounter (HOSPITAL_COMMUNITY)
Admission: EM | Disposition: A | Discharge status: Rehabilitation Facility | Payer: Self-pay | Source: Other Acute Inpatient Hospital | Attending: Neurology

## 2022-02-10 ENCOUNTER — Emergency Department
Admission: EM | Admit: 2022-02-10 | Discharge: 2022-02-10 | Disposition: A | Discharge status: Other Acute Inpatient Hospital | Payer: Medicare Other | Attending: Emergency Medicine | Admitting: Emergency Medicine

## 2022-02-10 ENCOUNTER — Other Ambulatory Visit: Payer: Self-pay

## 2022-02-10 DIAGNOSIS — N1831 Chronic kidney disease, stage 3a: Secondary | ICD-10-CM | POA: Diagnosis present

## 2022-02-10 DIAGNOSIS — E669 Obesity, unspecified: Secondary | ICD-10-CM | POA: Diagnosis present

## 2022-02-10 DIAGNOSIS — E1122 Type 2 diabetes mellitus with diabetic chronic kidney disease: Secondary | ICD-10-CM | POA: Diagnosis present

## 2022-02-10 DIAGNOSIS — N170 Acute kidney failure with tubular necrosis: Secondary | ICD-10-CM | POA: Diagnosis not present

## 2022-02-10 DIAGNOSIS — R112 Nausea with vomiting, unspecified: Secondary | ICD-10-CM | POA: Diagnosis present

## 2022-02-10 DIAGNOSIS — G9349 Other encephalopathy: Secondary | ICD-10-CM | POA: Diagnosis present

## 2022-02-10 DIAGNOSIS — I614 Nontraumatic intracerebral hemorrhage in cerebellum: Secondary | ICD-10-CM | POA: Diagnosis present

## 2022-02-10 DIAGNOSIS — E875 Hyperkalemia: Secondary | ICD-10-CM

## 2022-02-10 DIAGNOSIS — I69391 Dysphagia following cerebral infarction: Secondary | ICD-10-CM | POA: Diagnosis not present

## 2022-02-10 DIAGNOSIS — I619 Nontraumatic intracerebral hemorrhage, unspecified: Secondary | ICD-10-CM | POA: Diagnosis not present

## 2022-02-10 DIAGNOSIS — M542 Cervicalgia: Secondary | ICD-10-CM | POA: Insufficient documentation

## 2022-02-10 DIAGNOSIS — E878 Other disorders of electrolyte and fluid balance, not elsewhere classified: Secondary | ICD-10-CM | POA: Diagnosis not present

## 2022-02-10 DIAGNOSIS — G9340 Encephalopathy, unspecified: Secondary | ICD-10-CM | POA: Diagnosis not present

## 2022-02-10 DIAGNOSIS — D649 Anemia, unspecified: Secondary | ICD-10-CM

## 2022-02-10 DIAGNOSIS — D461 Refractory anemia with ring sideroblasts: Secondary | ICD-10-CM | POA: Diagnosis not present

## 2022-02-10 DIAGNOSIS — N1832 Chronic kidney disease, stage 3b: Secondary | ICD-10-CM | POA: Diagnosis not present

## 2022-02-10 DIAGNOSIS — I959 Hypotension, unspecified: Secondary | ICD-10-CM | POA: Diagnosis present

## 2022-02-10 DIAGNOSIS — R609 Edema, unspecified: Secondary | ICD-10-CM | POA: Diagnosis not present

## 2022-02-10 DIAGNOSIS — D696 Thrombocytopenia, unspecified: Secondary | ICD-10-CM | POA: Diagnosis present

## 2022-02-10 DIAGNOSIS — R29810 Facial weakness: Secondary | ICD-10-CM | POA: Diagnosis present

## 2022-02-10 DIAGNOSIS — K219 Gastro-esophageal reflux disease without esophagitis: Secondary | ICD-10-CM

## 2022-02-10 DIAGNOSIS — R1313 Dysphagia, pharyngeal phase: Secondary | ICD-10-CM | POA: Diagnosis present

## 2022-02-10 DIAGNOSIS — G9341 Metabolic encephalopathy: Secondary | ICD-10-CM

## 2022-02-10 DIAGNOSIS — J9601 Acute respiratory failure with hypoxia: Secondary | ICD-10-CM | POA: Diagnosis present

## 2022-02-10 DIAGNOSIS — Z7984 Long term (current) use of oral hypoglycemic drugs: Secondary | ICD-10-CM

## 2022-02-10 DIAGNOSIS — T45515A Adverse effect of anticoagulants, initial encounter: Secondary | ICD-10-CM | POA: Diagnosis present

## 2022-02-10 DIAGNOSIS — G936 Cerebral edema: Secondary | ICD-10-CM

## 2022-02-10 DIAGNOSIS — E870 Hyperosmolality and hypernatremia: Secondary | ICD-10-CM | POA: Diagnosis not present

## 2022-02-10 DIAGNOSIS — E1165 Type 2 diabetes mellitus with hyperglycemia: Secondary | ICD-10-CM | POA: Diagnosis present

## 2022-02-10 DIAGNOSIS — I480 Paroxysmal atrial fibrillation: Secondary | ICD-10-CM | POA: Diagnosis present

## 2022-02-10 DIAGNOSIS — D6832 Hemorrhagic disorder due to extrinsic circulating anticoagulants: Secondary | ICD-10-CM | POA: Diagnosis present

## 2022-02-10 DIAGNOSIS — E119 Type 2 diabetes mellitus without complications: Secondary | ICD-10-CM | POA: Diagnosis not present

## 2022-02-10 DIAGNOSIS — G911 Obstructive hydrocephalus: Secondary | ICD-10-CM

## 2022-02-10 DIAGNOSIS — D469 Myelodysplastic syndrome, unspecified: Secondary | ICD-10-CM

## 2022-02-10 DIAGNOSIS — Z87891 Personal history of nicotine dependence: Secondary | ICD-10-CM

## 2022-02-10 DIAGNOSIS — I272 Pulmonary hypertension, unspecified: Secondary | ICD-10-CM | POA: Diagnosis present

## 2022-02-10 DIAGNOSIS — I4821 Permanent atrial fibrillation: Secondary | ICD-10-CM | POA: Diagnosis not present

## 2022-02-10 DIAGNOSIS — I952 Hypotension due to drugs: Secondary | ICD-10-CM | POA: Diagnosis not present

## 2022-02-10 DIAGNOSIS — I482 Chronic atrial fibrillation, unspecified: Secondary | ICD-10-CM | POA: Diagnosis not present

## 2022-02-10 DIAGNOSIS — G935 Compression of brain: Secondary | ICD-10-CM | POA: Diagnosis present

## 2022-02-10 DIAGNOSIS — G919 Hydrocephalus, unspecified: Secondary | ICD-10-CM | POA: Diagnosis not present

## 2022-02-10 DIAGNOSIS — I4891 Unspecified atrial fibrillation: Secondary | ICD-10-CM | POA: Diagnosis not present

## 2022-02-10 DIAGNOSIS — Z79899 Other long term (current) drug therapy: Secondary | ICD-10-CM

## 2022-02-10 DIAGNOSIS — I6389 Other cerebral infarction: Secondary | ICD-10-CM | POA: Diagnosis not present

## 2022-02-10 DIAGNOSIS — I10 Essential (primary) hypertension: Secondary | ICD-10-CM | POA: Insufficient documentation

## 2022-02-10 DIAGNOSIS — R34 Anuria and oliguria: Secondary | ICD-10-CM | POA: Diagnosis not present

## 2022-02-10 DIAGNOSIS — I629 Nontraumatic intracranial hemorrhage, unspecified: Secondary | ICD-10-CM | POA: Diagnosis not present

## 2022-02-10 DIAGNOSIS — R059 Cough, unspecified: Secondary | ICD-10-CM

## 2022-02-10 DIAGNOSIS — G8324 Monoplegia of upper limb affecting left nondominant side: Secondary | ICD-10-CM | POA: Diagnosis present

## 2022-02-10 DIAGNOSIS — J690 Pneumonitis due to inhalation of food and vomit: Secondary | ICD-10-CM | POA: Diagnosis not present

## 2022-02-10 DIAGNOSIS — J90 Pleural effusion, not elsewhere classified: Secondary | ICD-10-CM | POA: Diagnosis not present

## 2022-02-10 DIAGNOSIS — K5900 Constipation, unspecified: Secondary | ICD-10-CM | POA: Diagnosis present

## 2022-02-10 DIAGNOSIS — Z7901 Long term (current) use of anticoagulants: Secondary | ICD-10-CM | POA: Diagnosis not present

## 2022-02-10 DIAGNOSIS — R451 Restlessness and agitation: Secondary | ICD-10-CM | POA: Diagnosis not present

## 2022-02-10 DIAGNOSIS — R1310 Dysphagia, unspecified: Secondary | ICD-10-CM

## 2022-02-10 DIAGNOSIS — R1312 Dysphagia, oropharyngeal phase: Secondary | ICD-10-CM | POA: Diagnosis not present

## 2022-02-10 DIAGNOSIS — I129 Hypertensive chronic kidney disease with stage 1 through stage 4 chronic kidney disease, or unspecified chronic kidney disease: Secondary | ICD-10-CM | POA: Diagnosis present

## 2022-02-10 DIAGNOSIS — Z6833 Body mass index (BMI) 33.0-33.9, adult: Secondary | ICD-10-CM

## 2022-02-10 DIAGNOSIS — D6489 Other specified anemias: Secondary | ICD-10-CM | POA: Diagnosis present

## 2022-02-10 DIAGNOSIS — G8929 Other chronic pain: Secondary | ICD-10-CM | POA: Diagnosis not present

## 2022-02-10 DIAGNOSIS — G8194 Hemiplegia, unspecified affecting left nondominant side: Secondary | ICD-10-CM | POA: Diagnosis present

## 2022-02-10 DIAGNOSIS — Z856 Personal history of leukemia: Secondary | ICD-10-CM

## 2022-02-10 DIAGNOSIS — Z20822 Contact with and (suspected) exposure to covid-19: Secondary | ICD-10-CM | POA: Diagnosis not present

## 2022-02-10 DIAGNOSIS — Z9889 Other specified postprocedural states: Secondary | ICD-10-CM | POA: Diagnosis not present

## 2022-02-10 DIAGNOSIS — N179 Acute kidney failure, unspecified: Secondary | ICD-10-CM

## 2022-02-10 DIAGNOSIS — Z66 Do not resuscitate: Secondary | ICD-10-CM | POA: Diagnosis not present

## 2022-02-10 DIAGNOSIS — E785 Hyperlipidemia, unspecified: Secondary | ICD-10-CM

## 2022-02-10 DIAGNOSIS — Z515 Encounter for palliative care: Secondary | ICD-10-CM | POA: Diagnosis not present

## 2022-02-10 DIAGNOSIS — J9690 Respiratory failure, unspecified, unspecified whether with hypoxia or hypercapnia: Secondary | ICD-10-CM

## 2022-02-10 DIAGNOSIS — D6189 Other specified aplastic anemias and other bone marrow failure syndromes: Secondary | ICD-10-CM | POA: Diagnosis not present

## 2022-02-10 DIAGNOSIS — I4819 Other persistent atrial fibrillation: Secondary | ICD-10-CM | POA: Diagnosis not present

## 2022-02-10 DIAGNOSIS — I350 Nonrheumatic aortic (valve) stenosis: Secondary | ICD-10-CM | POA: Diagnosis present

## 2022-02-10 DIAGNOSIS — J9621 Acute and chronic respiratory failure with hypoxia: Secondary | ICD-10-CM | POA: Diagnosis not present

## 2022-02-10 DIAGNOSIS — J9622 Acute and chronic respiratory failure with hypercapnia: Secondary | ICD-10-CM | POA: Diagnosis not present

## 2022-02-10 DIAGNOSIS — Z7189 Other specified counseling: Secondary | ICD-10-CM | POA: Diagnosis not present

## 2022-02-10 HISTORY — PX: CRANIOTOMY: SHX93

## 2022-02-10 HISTORY — DX: Nonrheumatic aortic (valve) stenosis: I35.0

## 2022-02-10 HISTORY — DX: Myelodysplastic syndrome, unspecified: D46.9

## 2022-02-10 LAB — COMPREHENSIVE METABOLIC PANEL
ALT: 18 U/L (ref 0–44)
AST: 24 U/L (ref 15–41)
Albumin: 3.9 g/dL (ref 3.5–5.0)
Alkaline Phosphatase: 92 U/L (ref 38–126)
Anion gap: 4 — ABNORMAL LOW (ref 5–15)
BUN: 33 mg/dL — ABNORMAL HIGH (ref 8–23)
CO2: 23 mmol/L (ref 22–32)
Calcium: 9.1 mg/dL (ref 8.9–10.3)
Chloride: 110 mmol/L (ref 98–111)
Creatinine, Ser: 1.24 mg/dL (ref 0.61–1.24)
GFR, Estimated: >60 mL/min (ref >60)
Glucose, Bld: 171 mg/dL — ABNORMAL HIGH (ref 70–99)
Potassium: 4.7 mmol/L (ref 3.5–5.1)
Sodium: 137 mmol/L (ref 135–145)
Total Bilirubin: 3.3 mg/dL — ABNORMAL HIGH (ref 0.3–1.2)
Total Protein: 7.2 g/dL (ref 6.5–8.1)

## 2022-02-10 LAB — DIFFERENTIAL
Abs Immature Granulocytes: 0.04 10*3/uL (ref 0.00–0.07)
Basophils Absolute: 0 10*3/uL (ref 0.0–0.1)
Basophils Relative: 0 %
Eosinophils Absolute: 0 10*3/uL (ref 0.0–0.5)
Eosinophils Relative: 0 %
Immature Granulocytes: 1 %
Lymphocytes Relative: 5 %
Lymphs Abs: 0.4 10*3/uL — ABNORMAL LOW (ref 0.7–4.0)
Monocytes Absolute: 0.3 10*3/uL (ref 0.1–1.0)
Monocytes Relative: 4 %
Neutro Abs: 7.4 10*3/uL (ref 1.7–7.7)
Neutrophils Relative %: 90 %
Smear Review: NORMAL

## 2022-02-10 LAB — POCT I-STAT 7, (LYTES, BLD GAS, ICA,H+H)
Acid-base deficit: 8 mmol/L — ABNORMAL HIGH (ref 0.0–2.0)
Bicarbonate: 18.6 mmol/L — ABNORMAL LOW (ref 20.0–28.0)
Calcium, Ion: 1.32 mmol/L (ref 1.15–1.40)
HCT: 35 % — ABNORMAL LOW (ref 39.0–52.0)
Hemoglobin: 11.9 g/dL — ABNORMAL LOW (ref 13.0–17.0)
O2 Saturation: 94 %
Patient temperature: 36.3
Potassium: 4.8 mmol/L (ref 3.5–5.1)
Sodium: 138 mmol/L (ref 135–145)
TCO2: 20 mmol/L — ABNORMAL LOW (ref 22–32)
pCO2 arterial: 42.5 mmHg (ref 32–48)
pH, Arterial: 7.247 — ABNORMAL LOW (ref 7.35–7.45)
pO2, Arterial: 81 mmHg — ABNORMAL LOW (ref 83–108)

## 2022-02-10 LAB — ABO/RH: ABO/RH(D): O POS

## 2022-02-10 LAB — TROPONIN I (HIGH SENSITIVITY): Troponin I (High Sensitivity): 136 ng/L (ref <18)

## 2022-02-10 LAB — PREPARE RBC (CROSSMATCH)

## 2022-02-10 LAB — CBC
HCT: 28.4 % — ABNORMAL LOW (ref 39.0–52.0)
Hemoglobin: 8.9 g/dL — ABNORMAL LOW (ref 13.0–17.0)
MCH: 30 pg (ref 26.0–34.0)
MCHC: 31.3 g/dL (ref 30.0–36.0)
MCV: 95.6 fL (ref 80.0–100.0)
Platelets: 163 10*3/uL (ref 150–400)
RBC: 2.97 MIL/uL — ABNORMAL LOW (ref 4.22–5.81)
RDW: 28.5 % — ABNORMAL HIGH (ref 11.5–15.5)
WBC: 8.2 10*3/uL (ref 4.0–10.5)
nRBC: 0.4 % — ABNORMAL HIGH (ref 0.0–0.2)

## 2022-02-10 LAB — MRSA NEXT GEN BY PCR, NASAL: MRSA by PCR Next Gen: NOT DETECTED

## 2022-02-10 LAB — PROTIME-INR
INR: 1.5 — ABNORMAL HIGH (ref 0.8–1.2)
Prothrombin Time: 17.9 seconds — ABNORMAL HIGH (ref 11.4–15.2)

## 2022-02-10 LAB — RESP PANEL BY RT-PCR (FLU A&B, COVID) ARPGX2
Influenza A by PCR: NEGATIVE
Influenza B by PCR: NEGATIVE
SARS Coronavirus 2 by RT PCR: NEGATIVE

## 2022-02-10 LAB — CBG MONITORING, ED: Glucose-Capillary: 158 mg/dL — ABNORMAL HIGH (ref 70–99)

## 2022-02-10 LAB — SODIUM: Sodium: 140 mmol/L (ref 135–145)

## 2022-02-10 LAB — APTT: aPTT: 40 seconds — ABNORMAL HIGH (ref 24–36)

## 2022-02-10 SURGERY — CRANIOTOMY HEMATOMA EVACUATION SUBDURAL
Anesthesia: General | Site: Head

## 2022-02-10 MED ORDER — THROMBIN 20000 UNITS EX SOLR
CUTANEOUS | Status: DC | PRN
  Administered 2022-02-10: 20 mL via TOPICAL

## 2022-02-10 MED ORDER — CHLORHEXIDINE GLUCONATE 0.12% ORAL RINSE (MEDLINE KIT)
15.0000 mL | Freq: Two times a day (BID) | OROMUCOSAL | Status: DC
  Administered 2022-02-10 – 2022-02-26 (×28): 15 mL via OROMUCOSAL

## 2022-02-10 MED ORDER — BACITRACIN ZINC 500 UNIT/GM EX OINT
TOPICAL_OINTMENT | CUTANEOUS | Status: AC
  Filled 2022-02-10: qty 28.35

## 2022-02-10 MED ORDER — ROCURONIUM BROMIDE 10 MG/ML (PF) SYRINGE
PREFILLED_SYRINGE | INTRAVENOUS | Status: DC | PRN
  Administered 2022-02-10: 30 mg via INTRAVENOUS
  Administered 2022-02-10: 50 mg via INTRAVENOUS
  Administered 2022-02-11: 20 mg via INTRAVENOUS

## 2022-02-10 MED ORDER — SODIUM CHLORIDE 0.9 % IV SOLN: INTRAVENOUS | Status: DC | PRN

## 2022-02-10 MED ORDER — THROMBIN 20000 UNITS EX SOLR
CUTANEOUS | Status: AC
  Filled 2022-02-10: qty 20000

## 2022-02-10 MED ORDER — MIDAZOLAM HCL 2 MG/2ML IJ SOLN
4.0000 mg | Freq: Once | INTRAMUSCULAR | Status: AC
  Administered 2022-02-10: 4 mg via INTRAVENOUS

## 2022-02-10 MED ORDER — PANTOPRAZOLE SODIUM 40 MG IV SOLR: 40.0000 mg | Freq: Every day | INTRAVENOUS | Status: DC

## 2022-02-10 MED ORDER — SODIUM CHLORIDE 0.9% IV SOLUTION: Freq: Once | INTRAVENOUS | Status: DC

## 2022-02-10 MED ORDER — CEFAZOLIN SODIUM-DEXTROSE 2-3 GM-%(50ML) IV SOLR
INTRAVENOUS | Status: DC | PRN
  Administered 2022-02-10: 2 g via INTRAVENOUS

## 2022-02-10 MED ORDER — SODIUM CHLORIDE 3 % IV SOLN
INTRAVENOUS | Status: DC
  Filled 2022-02-10 (×7): qty 500

## 2022-02-10 MED ORDER — SODIUM CHLORIDE 3 % IV BOLUS
250.0000 mL | Freq: Once | INTRAVENOUS | Status: AC
  Administered 2022-02-10: 250 mL via INTRAVENOUS
  Filled 2022-02-10: qty 500

## 2022-02-10 MED ORDER — LABETALOL HCL 5 MG/ML IV SOLN
10.0000 mg | Freq: Once | INTRAVENOUS | Status: AC
  Administered 2022-02-10: 10 mg via INTRAVENOUS
  Filled 2022-02-10: qty 4

## 2022-02-10 MED ORDER — SENNOSIDES-DOCUSATE SODIUM 8.6-50 MG PO TABS
1.0000 | ORAL_TABLET | Freq: Two times a day (BID) | ORAL | Status: DC
  Administered 2022-02-12 – 2022-02-26 (×20): 1
  Filled 2022-02-10 (×21): qty 1

## 2022-02-10 MED ORDER — LIDOCAINE-EPINEPHRINE 1 %-1:100000 IJ SOLN
INTRAMUSCULAR | Status: DC | PRN
  Administered 2022-02-10: 5 mL via INTRADERMAL

## 2022-02-10 MED ORDER — ETOMIDATE 2 MG/ML IV SOLN
20.0000 mg | Freq: Once | INTRAVENOUS | Status: AC
  Administered 2022-02-10: 20 mg via INTRAVENOUS
  Filled 2022-02-10: qty 10

## 2022-02-10 MED ORDER — ROCURONIUM BROMIDE 10 MG/ML (PF) SYRINGE
PREFILLED_SYRINGE | INTRAVENOUS | Status: AC
  Filled 2022-02-10: qty 20

## 2022-02-10 MED ORDER — FENTANYL CITRATE PF 50 MCG/ML IJ SOSY: 25.0000 ug | PREFILLED_SYRINGE | Freq: Once | INTRAMUSCULAR | Status: DC

## 2022-02-10 MED ORDER — ORAL CARE MOUTH RINSE
15.0000 mL | OROMUCOSAL | Status: DC
  Administered 2022-02-11 – 2022-02-19 (×78): 15 mL via OROMUCOSAL

## 2022-02-10 MED ORDER — SUCCINYLCHOLINE CHLORIDE 200 MG/10ML IV SOSY
100.0000 mg | PREFILLED_SYRINGE | Freq: Once | INTRAVENOUS | Status: AC
  Administered 2022-02-10: 100 mg via INTRAVENOUS
  Filled 2022-02-10: qty 10

## 2022-02-10 MED ORDER — PROPOFOL 1000 MG/100ML IV EMUL
5.0000 ug/kg/min | INTRAVENOUS | Status: DC
  Administered 2022-02-10: 10 ug/kg/min via INTRAVENOUS
  Administered 2022-02-11: 20 ug/kg/min via INTRAVENOUS
  Administered 2022-02-11: 30 ug/kg/min via INTRAVENOUS
  Administered 2022-02-12 – 2022-02-13 (×2): 10 ug/kg/min via INTRAVENOUS
  Filled 2022-02-10 (×6): qty 100

## 2022-02-10 MED ORDER — IOHEXOL 350 MG/ML SOLN
75.0000 mL | Freq: Once | INTRAVENOUS | Status: AC | PRN
  Administered 2022-02-10: 75 mL via INTRAVENOUS

## 2022-02-10 MED ORDER — ACETAMINOPHEN 650 MG RE SUPP: 650.0000 mg | RECTAL | Status: DC | PRN

## 2022-02-10 MED ORDER — PROPOFOL 10 MG/ML IV BOLUS
INTRAVENOUS | Status: AC
  Filled 2022-02-10: qty 20

## 2022-02-10 MED ORDER — THROMBIN 5000 UNITS EX SOLR
CUTANEOUS | Status: AC
  Filled 2022-02-10: qty 5000

## 2022-02-10 MED ORDER — FENTANYL 2500MCG IN NS 250ML (10MCG/ML) PREMIX INFUSION
25.0000 ug/h | INTRAVENOUS | Status: DC
  Administered 2022-02-10 – 2022-02-13 (×3): 50 ug/h via INTRAVENOUS
  Filled 2022-02-10 (×2): qty 250

## 2022-02-10 MED ORDER — ONDANSETRON HCL 4 MG/2ML IJ SOLN
4.0000 mg | Freq: Once | INTRAMUSCULAR | Status: AC
  Administered 2022-02-10: 4 mg via INTRAVENOUS
  Filled 2022-02-10: qty 2

## 2022-02-10 MED ORDER — ACETAMINOPHEN 160 MG/5ML PO SOLN
650.0000 mg | ORAL | Status: DC | PRN
  Administered 2022-02-12 – 2022-02-15 (×5): 650 mg
  Filled 2022-02-10 (×5): qty 20.3

## 2022-02-10 MED ORDER — STROKE: EARLY STAGES OF RECOVERY BOOK: Freq: Once | Status: AC

## 2022-02-10 MED ORDER — MIDAZOLAM HCL 2 MG/2ML IJ SOLN
INTRAMUSCULAR | Status: AC
  Filled 2022-02-10: qty 4

## 2022-02-10 MED ORDER — ROCURONIUM BROMIDE 50 MG/5ML IV SOLN: 100.0000 mg | Freq: Once | INTRAVENOUS | Status: DC

## 2022-02-10 MED ORDER — PROPOFOL 1000 MG/100ML IV EMUL
5.0000 ug/kg/min | INTRAVENOUS | Status: DC
  Administered 2022-02-10: 10 ug/kg/min via INTRAVENOUS
  Administered 2022-02-10: 50 ug/kg/min via INTRAVENOUS
  Filled 2022-02-10: qty 100

## 2022-02-10 MED ORDER — FENTANYL 2500MCG IN NS 250ML (10MCG/ML) PREMIX INFUSION
INTRAVENOUS | Status: AC
  Administered 2022-02-10: 50 ug/h
  Filled 2022-02-10: qty 250

## 2022-02-10 MED ORDER — SODIUM CHLORIDE 0.9 % IV BOLUS
1000.0000 mL | Freq: Once | INTRAVENOUS | Status: AC
  Administered 2022-02-10: 1000 mL via INTRAVENOUS

## 2022-02-10 MED ORDER — BACITRACIN ZINC 500 UNIT/GM EX OINT
TOPICAL_OINTMENT | CUTANEOUS | Status: DC | PRN
  Administered 2022-02-10: 1 via TOPICAL

## 2022-02-10 MED ORDER — CEFAZOLIN SODIUM 1 G IJ SOLR
INTRAMUSCULAR | Status: AC
  Filled 2022-02-10: qty 20

## 2022-02-10 MED ORDER — PROPOFOL 10 MG/ML IV BOLUS
INTRAVENOUS | Status: DC | PRN
  Administered 2022-02-10: 50 mg via INTRAVENOUS

## 2022-02-10 MED ORDER — GELATIN ABSORBABLE MT POWD
OROMUCOSAL | Status: DC | PRN
  Administered 2022-02-10 – 2022-02-11 (×3): 5 mL

## 2022-02-10 MED ORDER — LIDOCAINE-EPINEPHRINE 1 %-1:100000 IJ SOLN
INTRAMUSCULAR | Status: AC
  Filled 2022-02-10: qty 1

## 2022-02-10 MED ORDER — 0.9 % SODIUM CHLORIDE (POUR BTL) OPTIME
TOPICAL | Status: DC | PRN
  Administered 2022-02-10 (×2): 1000 mL

## 2022-02-10 MED ORDER — GLYCOPYRROLATE PF 0.2 MG/ML IJ SOSY
PREFILLED_SYRINGE | INTRAMUSCULAR | Status: DC | PRN
Start: 2022-02-10 — End: 2022-02-11
  Administered 2022-02-10: .2 mg via INTRAVENOUS

## 2022-02-10 MED ORDER — FENTANYL BOLUS VIA INFUSION
25.0000 ug | INTRAVENOUS | Status: DC | PRN
  Administered 2022-02-12 (×3): 50 ug via INTRAVENOUS
  Administered 2022-02-12: 25 ug via INTRAVENOUS
  Administered 2022-02-12: 50 ug via INTRAVENOUS
  Filled 2022-02-10: qty 100

## 2022-02-10 MED ORDER — SODIUM CHLORIDE 0.9 % IV SOLN: 250.0000 mL | INTRAVENOUS | Status: DC

## 2022-02-10 MED ORDER — EMPTY CONTAINERS FLEXIBLE MISC
900.0000 mg | Freq: Once | Status: AC
  Administered 2022-02-10: 900 mg via INTRAVENOUS
  Filled 2022-02-10: qty 90

## 2022-02-10 MED ORDER — CHLORHEXIDINE GLUCONATE CLOTH 2 % EX PADS
6.0000 | MEDICATED_PAD | Freq: Every day | CUTANEOUS | Status: DC
  Administered 2022-02-10 – 2022-02-19 (×11): 6 via TOPICAL

## 2022-02-10 MED ORDER — NOREPINEPHRINE 4 MG/250ML-% IV SOLN
2.0000 ug/min | INTRAVENOUS | Status: DC
  Administered 2022-02-10: 2 ug/min via INTRAVENOUS
  Administered 2022-02-11: 8 ug/min via INTRAVENOUS
  Administered 2022-02-11: 10 ug/min via INTRAVENOUS
  Administered 2022-02-11: 11 ug/min via INTRAVENOUS
  Administered 2022-02-12: 9 ug/min via INTRAVENOUS
  Filled 2022-02-10 (×5): qty 250

## 2022-02-10 MED ORDER — ACETAMINOPHEN 325 MG PO TABS: 650.0000 mg | ORAL_TABLET | ORAL | Status: DC | PRN

## 2022-02-10 MED ORDER — CLEVIDIPINE BUTYRATE 0.5 MG/ML IV EMUL
INTRAVENOUS | Status: AC
  Administered 2022-02-10: 2 mg/h via INTRAVENOUS
  Filled 2022-02-10: qty 50

## 2022-02-10 MED ORDER — BUPIVACAINE HCL (PF) 0.5 % IJ SOLN
INTRAMUSCULAR | Status: AC
  Filled 2022-02-10: qty 30

## 2022-02-10 MED ORDER — FENTANYL CITRATE (PF) 250 MCG/5ML IJ SOLN
INTRAMUSCULAR | Status: AC
  Filled 2022-02-10: qty 5

## 2022-02-10 MED ORDER — CLEVIDIPINE BUTYRATE 0.5 MG/ML IV EMUL: 0.0000 mg/h | INTRAVENOUS | Status: DC

## 2022-02-10 MED ORDER — BUPIVACAINE HCL (PF) 0.5 % IJ SOLN
INTRAMUSCULAR | Status: DC | PRN
  Administered 2022-02-10: 5 mL

## 2022-02-10 SURGICAL SUPPLY — 62 items
BAG COUNTER SPONGE SURGICOUNT (BAG) ×3 IMPLANT
BAG SPNG CNTER NS LX DISP (BAG) ×2
BASKET BONE COLLECTION (BASKET) IMPLANT
BATTERY IQ STERILE (MISCELLANEOUS) ×1 IMPLANT
BIT DRILL WIRE PASS 1.3MM (BIT) IMPLANT
BNDG GAUZE ELAST 4 BULKY (GAUZE/BANDAGES/DRESSINGS) ×2 IMPLANT
BTRY SRG DRVR 1.5 IQ (MISCELLANEOUS)
BUR ACORN 6.0 PRECISION (BURR) ×2 IMPLANT
BUR SPIRAL ROUTER 2.3 (BUR) IMPLANT
CANISTER SUCT 3000ML PPV (MISCELLANEOUS) ×3 IMPLANT
CATH VENTRIC 35X38 W/TROCAR LG (CATHETERS) ×1 IMPLANT
CLIP VESOCCLUDE MED 6/CT (CLIP) IMPLANT
DECANTER SPIKE VIAL GLASS SM (MISCELLANEOUS) ×2 IMPLANT
DEVICE DISSECT PLASMABLAD 3.0S (MISCELLANEOUS) IMPLANT
DRAIN CHANNEL 10M FLAT 3/4 FLT (DRAIN) IMPLANT
DRAIN PENROSE 1/2X12 LTX STRL (WOUND CARE) IMPLANT
DRAPE WARM FLUID 44X44 (DRAPES) ×2 IMPLANT
DRILL WIRE PASS 1.3MM (BIT)
DRSG ADAPTIC 3X8 NADH LF (GAUZE/BANDAGES/DRESSINGS) IMPLANT
DRSG PAD ABDOMINAL 8X10 ST (GAUZE/BANDAGES/DRESSINGS) IMPLANT
DURAPREP 6ML APPLICATOR 50/CS (WOUND CARE) ×2 IMPLANT
ELECT REM PT RETURN 9FT ADLT (ELECTROSURGICAL) ×2
ELECTRODE REM PT RTRN 9FT ADLT (ELECTROSURGICAL) ×1 IMPLANT
EVACUATOR 1/8 PVC DRAIN (DRAIN) ×1 IMPLANT
EVACUATOR SILICONE 100CC (DRAIN) IMPLANT
FORCEPS BIPOLAR SPETZLER 8 1.0 (NEUROSURGERY SUPPLIES) ×1 IMPLANT
GAUZE 4X4 16PLY ~~LOC~~+RFID DBL (SPONGE) ×2 IMPLANT
GAUZE SPONGE 4X4 12PLY STRL (GAUZE/BANDAGES/DRESSINGS) ×1 IMPLANT
GLOVE EXAM NITRILE XL STR (GLOVE) IMPLANT
GLOVE SURG LTX SZ8.5 (GLOVE) ×2 IMPLANT
GLOVE SURG UNDER POLY LF SZ8.5 (GLOVE) ×2 IMPLANT
GOWN STRL REUS W/ TWL LRG LVL3 (GOWN DISPOSABLE) IMPLANT
GOWN STRL REUS W/ TWL XL LVL3 (GOWN DISPOSABLE) ×1 IMPLANT
GOWN STRL REUS W/TWL 2XL LVL3 (GOWN DISPOSABLE) ×2 IMPLANT
GOWN STRL REUS W/TWL LRG LVL3 (GOWN DISPOSABLE) ×2
GOWN STRL REUS W/TWL XL LVL3 (GOWN DISPOSABLE)
HEMOSTAT SURGICEL 2X14 (HEMOSTASIS) IMPLANT
KIT BASIN OR (CUSTOM PROCEDURE TRAY) ×2 IMPLANT
KIT TURNOVER KIT B (KITS) ×2 IMPLANT
NEEDLE HYPO 22GX1.5 SAFETY (NEEDLE) ×2 IMPLANT
NS IRRIG 1000ML POUR BTL (IV SOLUTION) ×3 IMPLANT
PACK CRANIOTOMY CUSTOM (CUSTOM PROCEDURE TRAY) ×2 IMPLANT
PATTIES SURGICAL .5 X.5 (GAUZE/BANDAGES/DRESSINGS) IMPLANT
PATTIES SURGICAL .5 X1 (DISPOSABLE) ×1 IMPLANT
PATTIES SURGICAL .5 X3 (DISPOSABLE) ×1 IMPLANT
PATTIES SURGICAL 1X1 (DISPOSABLE) IMPLANT
PIN MAYFIELD SKULL DISP (PIN) ×1 IMPLANT
PLASMABLADE 3.0S (MISCELLANEOUS) ×2
SPECIMEN JAR SMALL (MISCELLANEOUS) IMPLANT
SPONGE NEURO XRAY DETECT 1X3 (DISPOSABLE) ×1 IMPLANT
SPONGE SURGIFOAM ABS GEL 100 (HEMOSTASIS) ×1 IMPLANT
STAPLER SKIN PROX WIDE 3.9 (STAPLE) ×2 IMPLANT
SUT ETHILON 3 0 FSL (SUTURE) IMPLANT
SUT NURALON 4 0 TR CR/8 (SUTURE) ×4 IMPLANT
SUT VIC AB 2-0 CP2 18 (SUTURE) ×4 IMPLANT
SUT VIC AB 4-0 RB1 18 (SUTURE) ×1 IMPLANT
TAPE CLOTH SURG 4X10 WHT LF (GAUZE/BANDAGES/DRESSINGS) ×1 IMPLANT
TOWEL GREEN STERILE (TOWEL DISPOSABLE) ×2 IMPLANT
TOWEL GREEN STERILE FF (TOWEL DISPOSABLE) ×2 IMPLANT
TRAY FOLEY MTR SLVR 16FR STAT (SET/KITS/TRAYS/PACK) IMPLANT
UNDERPAD 30X36 HEAVY ABSORB (UNDERPADS AND DIAPERS) IMPLANT
WATER STERILE IRR 1000ML POUR (IV SOLUTION) ×2 IMPLANT

## 2022-02-10 NOTE — ED Notes (Signed)
Now back in ED room after CT. Placed new IV and drew labs. Pt last took Eliquis (for a fib) this morning at 0900. Pt LKW was 127min shower with LLE and LUE weakness, and HA behind L eye, 3/10. CT without contrast was positive for ICH. Pharmacy was called to come to CT and neurologist spoke with pharmacist about Eliquis reversal. Plan will be to transfer, SBP to remain less than 150 and HOB to remain elevated at 30 degrees. ?

## 2022-02-10 NOTE — Progress Notes (Signed)
eLink Physician-Brief Progress Note ?Patient Name: Vernon [name] ?DOB: 1946/03/13 ?MRN: [id number] ? ? ?Date of Service ? 02/10/2022  ?HPI/Events of Note ? Patient with atrial fibrillation on Eliquis, transferred from OSH secondary to [place], intubated for airway protection, and now on the ventilator. Eliquis was reversed with [name], patient has had seizures which are being treated, neurology is following.  ?eICU Interventions ? New Patient Evaluation.  ? ? ? ?  ? ?Frederik Pear ?02/10/2022, 8:04 PM ?

## 2022-02-10 NOTE — ED Notes (Signed)
CODE STROKE called to [facility] spoke with Taryn ?

## 2022-02-10 NOTE — ED Notes (Signed)
Neurologist came and saw pt again at bedside. Pt leaving with [facility]. Unable to obtain signature for transfer due to pt being intubated. ?

## 2022-02-10 NOTE — Anesthesia Procedure Notes (Signed)
Arterial Line Insertion ?Start/End3/18/2023 10:34 PM ?Performed by: Trinna Post., CRNA, CRNA ? Patient location: OR. ?Preanesthetic checklist: patient identified, IV checked, site marked, risks and benefits discussed, surgical consent, monitors and equipment checked, pre-op evaluation, timeout performed and anesthesia consent ?Emergency situation ?Patient sedated ?Left, radial was placed ?Catheter size: 20 G ?Hand hygiene performed  and maximum sterile barriers used  ? ?Attempts: 1 ?Procedure performed without using ultrasound guided technique. ?Following insertion, dressing applied and Biopatch. ?Post procedure assessment: normal ? ?Patient tolerated the procedure well with no immediate complications. ? ? ?

## 2022-02-10 NOTE — ED Notes (Signed)
Neurologist at bedside talking with pt and son. ?

## 2022-02-10 NOTE — ED Notes (Signed)
EDP told nurse to call secretary to call code stroke. ?

## 2022-02-10 NOTE — Consult Note (Signed)
NEUROLOGY CONSULTATION NOTE  ? ?Date of service: February 10, 2022 ?Patient Name: Vernon [name] ?MRN:  [id number] ?DOB:  1946/10/24 ?Reason for consult: stroke code ?Requesting physician: Dr. Arta Silence ?_ _ _   _ __   _ __ _ _  __ __   _ __   __ _ ? ?History of Present Illness  ? ?76 yo gentleman with hx a fib on eliquis, myelodysplastic syndrome, DM2, HTN who presents with L sided incoordination since 12pm today and vomiting. Last dose of eliquis 0900. NIHSS = 3 for level of consciousness, LUE ataxia, and dysarthria. 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 CTA showed no cerebrovascular malformation. Eliquis reversed with andexxa. Patient drowsy with increasing oxygen requirements and was intubated prior to transfer to [facility]. ? ?CNS imaging personally reviewed and discussed with radiology by phone.  ?  ?ROS  ? ?Per HPI: all other systems reviewed and are negative ? ?Past History  ? ?I have reviewed the following: ? ?Past Medical History:  ?Diagnosis Date  ? Anemia   ? Arthritis   ? Complication of anesthesia   ? hard time getting bp up after knee replacement  ? Diabetes mellitus without complication ([place])   ? GERD (gastroesophageal reflux disease)   ? occ tums prn  ? History of hiatal hernia   ? Hypertension   ? ?Past Surgical History:  ?Procedure Laterality Date  ? CARPAL TUNNEL RELEASE  2012  ? JOINT REPLACEMENT Right 2010  ? SHOULDER ARTHROSCOPY WITH ROTATOR CUFF REPAIR AND OPEN BICEPS TENODESIS Right 11/09/2019  ? Procedure: RIGHT SHOULDER ARTHROSCOPY WITH SUBSCAPULARIS REPAIR, SUBACROMIAL DECOMPRESSION,MINI OPEN ROTATOR CUFF REPAIR;  Surgeon: Leim Fabry, MD;  Location: [facility] ORS;  Service: Orthopedics;  Laterality: Right;  ? ?Family History  ?Problem Relation Age of Onset  ? Cancer Maternal Uncle   ? ?Social History  ? ?Socioeconomic History  ? Marital status: Married  ?  Spouse name: Not on file  ? Number of children: Not on file  ? Years of education: Not on file  ?  Highest education level: Not on file  ?Occupational History  ? Not on file  ?Tobacco Use  ? Smoking status: Former  ?  Packs/day: 0.50  ?  Years: 8.00  ?  Pack years: 4.00  ?  Types: Cigarettes  ?  Quit date: 07/21/1978  ?  Years since quitting: 43.5  ? Smokeless tobacco: Never  ?Substance and Sexual Activity  ? Alcohol use: Yes  ?  Alcohol/week: 0.0 - 1.0 standard drinks  ?  Comment: rare beer  ? Drug use: Never  ? Sexual activity: Not on file  ?Other Topics Concern  ? Not on file  ?Social History Narrative  ? Lives in [place]; with wife; quit smoking in early 31s; ocassional/ rare [may be 1 a month beer]. 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   ? ?Social Determinants of Health  ? ?Financial Resource Strain: Not on file  ?Food Insecurity: Not on file  ?Transportation Needs: Not on file  ?Physical Activity: Not on file  ?Stress: Not on file  ?Social Connections: Not on file  ? ?No Known Allergies ? ?Medications  ? ?(Not in a hospital admission) ?  ? ? ?Current Facility-Administered Medications:  ?  labetalol (NORMODYNE) injection 10 mg, 10 mg, Intravenous, Once, Arta Silence, MD ?  ondansetron (ZOFRAN) injection 4 mg, 4 mg, Intravenous, Once, Arta Silence, MD ? ?Current Outpatient Medications:  ?  acetaminophen (TYLENOL)  650 MG CR tablet, Take 650 mg by mouth every 8 (eight) hours as needed for pain., Disp: , Rfl:  ?  allopurinol (ZYLOPRIM) 300 MG tablet, Take 300 mg by mouth at bedtime. , Disp: , Rfl:  ?  apixaban (ELIQUIS) 5 MG TABS tablet, Take 5 mg by mouth 2 (two) times daily. , Disp: , Rfl:  ?  blood glucose meter kit and supplies, Use 1 kit as needed., Disp: , Rfl:  ?  Cyanocobalamin (VITAMIN B 12) 500 MCG TABS, Take by mouth., Disp: , Rfl:  ?  ferrous sulfate 325 (65 FE) MG EC tablet, Take 325 mg by mouth 2 (two) times daily. Patient reports takes once a day, Disp: , Rfl:  ?  lisinopril (ZESTRIL) 40 MG tablet, Take 40 mg by mouth at bedtime. , Disp: , Rfl:  ?  metFORMIN (GLUCOPHAGE) 500  MG tablet, Take 500 mg by mouth 2 (two) times daily with a meal. , Disp: , Rfl:  ?  Multiple Vitamins-Minerals (CENTRUM SILVER PO), Take 1 tablet by mouth daily., Disp: , Rfl:  ?  simvastatin (ZOCOR) 80 MG tablet, Take 40 mg by mouth daily at 6 PM. , Disp: , Rfl:  ? ?Vitals  ? ?Vitals:  ? 02/10/22 1548 02/10/22 1549  ?BP: (!) 184/77   ?Pulse: 65   ?Resp: (!) 25   ?Temp: 97.8 ?F (36.6 ?C)   ?TempSrc: Oral   ?SpO2: 92%   ?Weight:  97.5 kg  ?Height:  5' 9" (1.753 m)  ?  ? ?Body mass index is 31.75 kg/m?. ? ?Physical Exam  ? ?Exam in CT scan prior to intubation 45 min later ? ?Physical Exam ?Gen: asleep but arousable, oriented x3, follows simple commands ?HEENT: Atraumatic, normocephalic;mucous membranes moist; oropharynx clear, tongue without atrophy or fasciculations. ?Neck: Supple, trachea midline. ?Resp: CTAB, no w/r/r ?CV: RRR, no m/g/r; nml S1 and S2. 2+ symmetric peripheral pulses. ?Abd: soft/NT/ND; nabs x 4 quad ?Extrem: Nml bulk; no cyanosis, clubbing, or edema. ? ?Neuro: ?*MS: asleep but arousable, oriented x3, follows simple commands ?*Speech: moderate dysarthria, able to name and repeat ?*CN:  ?  I: Deferred ?  II,III: PERRLA, blinks to threat bilat, optic discs unable to be visualized 2/2 pupillary constriction ?  III,IV,VI: EOMI w/o nystagmus, no ptosis ?  V: Sensation intact from V1 to V3 to LT ?  VII: Eyelid closure was full.  Smile symmetric. ?  VIII: Hearing intact to voice ?  IX,X: Voice normal, palate elevates symmetrically  ?  XI: SCM/trap 5/5 bilat   ?XII: Tongue protrudes midline, no atrophy or fasciculations  ?*Motor:   Normal bulk.  No tremor, rigidity or bradykinesia. Strength 5/5 throughout without drift in any extremity. ?*Sensory: SILT. No double-simultaneous extinction.  ?*Coordination:  Ataxia L FNF, intact on R ?*Gait: deferred   ? ?NIHSS ? ?1a Level of Conscious.: 1 ?1b LOC Questions: 0 ?1c LOC Commands: 0 ?2 Best Gaze: 0 ?3 Visual: 0 ?4 Facial Palsy: 0 ?5a Motor Arm - left: 0 ?5b Motor  Arm - Right: 0 ?6a Motor Leg - Left: 0 ?6b Motor Leg - Right: 0 ?7 Limb Ataxia: 1 ?8 Sensory: 0 ?9 Best Language: 0 ?10 Dysarthria: 1 ?11 Extinct. and Inatten.: 0 ? ?TOTAL: 3 ? ? ?Premorbid mRS = 1 ? ? ?Labs  ? ?CBC: No results for input(s): WBC, NEUTROABS, HGB, HCT, MCV, PLT in the last 168 hours. ? ?Basic Metabolic Panel:  ?Lab Results  ?Component Value Date  ? NA 139 12/18/2021  ?   K 4.6 12/18/2021  ? CO2 24 12/18/2021  ? GLUCOSE 157 (H) 12/18/2021  ? BUN 35 (H) 12/18/2021  ? CREATININE 1.29 (H) 12/18/2021  ? CALCIUM 9.7 12/18/2021  ? GFRNONAA 58 (L) 12/18/2021  ? GFRAA >60 07/28/2020  ? ?Lipid Panel: No results found for: [place] ?HgbA1c: No results found for: HGBA1C ?Urine Drug Screen: No results found for: LABOPIA, COCAINSCRNUR, [place], [place], [facility]CU, LABBARB  ?Alcohol Level No results found for: ETH ? ? ?Impression  ? 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 Patient was lethargic and had increasing O2 requirements and consented to intubation prior to transfer. He wishes to be full code. Care plan discussed with son at bedside. ? ?Recommendations  ? ?- Transfer to [facility] neuro ICU for higher level of care - accepting MD Dr. Roland Rack ?- Eliquis reversed with andexxa; hold anticoagulation ?- Clevidipine for goal SBP <150 ?- SCDs for DVT prophylaxis ?- Head CT q 6 hrs assess stability of ICH ?- Intubated on propofol ?- HOB elevated 30 degrees ?- Recommend MRI brain with and without contrast after acute ICH addressed to r/o underlying mass lesion ?- STAT head CT for any decline in neurologic exam ?- FULL CODE ?  ?This patient is critically ill and at significant risk of neurological worsening, death and care requires constant monitoring of vital signs, hemodynamics,respiratory and cardiac monitoring, neurological assessment, discussion with family, other  specialists and medical decision making of high complexity. I spent 105 minutes of neurocritical care time  in the care of  this patient. This was time spent independent of any time provided by nurse practitioner

## 2022-02-10 NOTE — Consult Note (Addendum)
Chaplain met with Pt. and son at bedside due to code stroke pager. Introduced Animator. Pt and son were receptive to support prior to being incubated.   ?

## 2022-02-10 NOTE — Progress Notes (Signed)
An USGPIV (ultrasound guided PIV) has been placed for short-term vasopressor infusion. A correctly placed ivWatch must be used when administering Vasopressors. Should this treatment be needed beyond 72 hours, central line access should be obtained.  It will be the responsibility of the bedside nurse to follow best practice to prevent extravasations.   ?

## 2022-02-10 NOTE — ED Notes (Signed)
Desaturating to 88%, placed on 2L nasal cannula. ?

## 2022-02-10 NOTE — ED Notes (Signed)
Pt to CT

## 2022-02-10 NOTE — ED Provider Notes (Signed)
? ?[facility] ?Provider Note ? ? ? Event Date/Time  ? First MD Initiated Contact with Patient 02/10/22 1546   ?  (approximate) ? ? ?History  ? ?Emesis and Extremity Weakness ? ? ?HPI ? ?Vernon [name] is a 76 y.o. male with history of atrial fibrillation on Eliquis and mild dysplastic syndrome who presents with acute onset of difficulty controlling his left arm and left leg around noon today.  They are improved now.  The patient states that he took a shower and when he was coming out of the shower he suddenly felt like he could not control his left arm and left leg.  This lasted for a couple of hours and now seems to have improved while he was coming to the hospital.  The patient states that after this happened, he started having some pain in the left side of his neck, however he states that this pain is chronic and he believes that is due to arthritis.  He states that subsequently he became nauseous and vomited.  He was given a nausea medication by EMS but states he still feels somewhat nauseated and weak.  He denies any prior history of similar weakness or ataxia.  He has no headache. ? ? ?Physical Exam  ? ?Triage Vital Signs: ?ED Triage Vitals  ?Enc Vitals Group  ?   BP 02/10/22 1548 (!) 184/77  ?   Pulse Rate 02/10/22 1548 65  ?   Resp 02/10/22 1548 (!) 25  ?   Temp 02/10/22 1548 97.8 ?F (36.6 ?C)  ?   Temp Source 02/10/22 1548 Oral  ?   SpO2 02/10/22 1548 92 %  ?   Weight 02/10/22 1549 215 lb (97.5 kg)  ?   Height 02/10/22 1549 '5\' 9"'$  (1.753 m)  ?   Head Circumference --   ?   Peak Flow --   ?   Pain Score --   ?   Pain Loc --   ?   Pain Edu? --   ?   Excl. in [place]? --   ? ? ?Most recent vital signs: ?Vitals:  ? 02/10/22 1710 02/10/22 1712  ?BP: (!) 208/146   ?Pulse: (!) 105 (!) 101  ?Resp: 20 (!) 21  ?Temp:    ?SpO2: 100% 98%  ? ? ? ?General: Alert and oriented.  Somewhat uncomfortable appearing but in no acute distress. ?CV:  Good peripheral perfusion.  ?Resp:  Normal effort.  ?Abd:  No  distention.  ?Other:  EOMI.  PERRLA.  Cranial nerves III through XII intact.  Normal speech.  Ataxia to the left upper extremity.  Normal coordination and other extremities on finger-to-nose and heel-to-shin.  No pronator drift.  5/5 motor strength and intact sensation to all 4 extremities. ? ? ?ED Results / Procedures / Treatments  ? ?Labs ?(all labs ordered are listed, but only abnormal results are displayed) ?Labs Reviewed  ?PROTIME-INR - Abnormal; Notable for the following components:  ?    Result Value  ? Prothrombin Time 17.9 (*)   ? INR 1.5 (*)   ? All other components within normal limits  ?APTT - Abnormal; Notable for the following components:  ? aPTT 40 (*)   ? All other components within normal limits  ?CBC - Abnormal; Notable for the following components:  ? RBC 2.97 (*)   ? Hemoglobin 8.9 (*)   ? HCT 28.4 (*)   ? RDW 28.5 (*)   ? nRBC 0.4 (*)   ?  All other components within normal limits  ?DIFFERENTIAL - Abnormal; Notable for the following components:  ? Lymphs Abs 0.4 (*)   ? All other components within normal limits  ?COMPREHENSIVE METABOLIC PANEL - Abnormal; Notable for the following components:  ? Glucose, Bld 171 (*)   ? BUN 33 (*)   ? Total Bilirubin 3.3 (*)   ? Anion gap 4 (*)   ? All other components within normal limits  ?CBG MONITORING, ED - Abnormal; Notable for the following components:  ? Glucose-Capillary 158 (*)   ? All other components within normal limits  ?TROPONIN I (HIGH SENSITIVITY) - Abnormal; Notable for the following components:  ? Troponin I (High Sensitivity) 136 (*)   ? All other components within normal limits  ?RESP PANEL BY RT-PCR (FLU A&B, COVID) ARPGX2  ?URINALYSIS, ROUTINE W REFLEX MICROSCOPIC  ?ETHANOL  ?TROPONIN I (HIGH SENSITIVITY)  ? ? ? ?EKG ? ?ED ECG REPORT ?IArta Silence, the attending physician, personally viewed and interpreted this ECG. ? ?Date: 02/10/2022 ?EKG Time: 1543 ?Rate: 68 ?Rhythm: Atrial fibrillation ?QRS Axis: normal ?Intervals: LAFB ?ST/T  Wave abnormalities: Nonspecific ST abnormality ?Narrative Interpretation: Nonspecific abnormalities with no evidence of acute ischemia ? ? ? ?RADIOLOGY ? 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 ?PROCEDURES: ? ?Critical Care performed: No ? ?.Critical Care ?Performed by: Arta Silence, MD ?Authorized by: Arta Silence, MD  ? ?Critical care provider statement:  ?  Critical care time (minutes):  45 ?  Critical care was necessary to treat or prevent imminent or life-threatening deterioration of the following conditions:  CNS failure or compromise ?  Critical care was time spent personally by me on the following activities:  Development of treatment plan with patient or surrogate, discussions with consultants, evaluation of patient's response to treatment, examination of patient, ordering and review of laboratory studies, ordering and review of radiographic studies, ordering and performing treatments and interventions, pulse oximetry, re-evaluation of patient's condition and review of old charts ?  Care discussed with: admitting provider   ?Procedure Name: Intubation ?Date/Time: 02/10/2022 5:29 PM ?Performed by: Arta Silence, MD ?Pre-anesthesia Checklist: Patient identified, Patient being monitored, Emergency Drugs available, Timeout performed and Suction available ?Oxygen Delivery Method: Non-rebreather mask ?Preoxygenation: Pre-oxygenation with 100% oxygen ?Induction Type: Rapid sequence ?Ventilation: Mask ventilation without difficulty ?Laryngoscope Size: Glidescope and 4 ?Grade View: Grade I ?Tube size: 8.0 mm ?Number of attempts: 1 ?Placement Confirmation: ETT inserted through vocal cords under direct vision, CO2 detector and Breath sounds checked- equal and bilateral ?Secured at: 25 cm ?Tube secured with: ETT holder ? ? ? ? ? ?MEDICATIONS ORDERED IN ED: ?Medications  ?propofol (DIPRIVAN) 1000 MG/100ML  infusion (50 mcg/kg/min ? 97.5 kg Intravenous Bolus 02/10/22 1724)  ?clevidipine (CLEVIPREX) infusion 0.5 mg/mL (2 mg/hr Intravenous New Bag/Given 02/10/22 1716)  ?ondansetron [place]) injection 4 mg (4 mg Intravenous Given 02/10/22 1632)  ?labetalol (NORMODYNE) injection 10 mg (10 mg Intravenous Given 02/10/22 1711)  ?coag fact Xa recombinant ([name]) low dose infusion 900 mg (900 mg Intravenous New Bag/Given 02/10/22 1653)  ?iohexol (OMNIPAQUE) 350 MG/ML injection 75 mL (75 mLs Intravenous Contrast Given 02/10/22 1621)  ?etomidate (AMIDATE) injection 20 mg (20 mg Intravenous Given 02/10/22 1659)  ?succinylcholine (ANECTINE) syringe 100 mg (100 mg Intravenous Given 02/10/22 1701)  ? ? ? ?IMPRESSION / MDM / ASSESSMENT AND PLAN / ED COURSE  ?I reviewed the triage vital signs and the nursing notes. ? ?Sarina Ill  49-year-old male with PMH as noted above presents with acute onset of ataxia in his left arm and left leg about 4 hours ago which he states now seem to have resolved, associated with an exacerbation of chronic left neck pain as well as some nausea and vomiting. ? ?I reviewed the past medical records.  The patient has no recent ED visits or admissions.  He was last evaluated by hematology on 1/23 for his myelodysplastic syndrome.  He is on Eliquis for atrial fibrillation and states he took it today. ? ?Exam is significant for hypertension as well as left upper extremity ataxia.  Neurologic exam is otherwise normal.  He does not have any extremity weakness or facial droop at this time. ? ?Differential diagnosis includes, but is not limited to, CVA, TIA, intracranial hemorrhage, aneurysm, complex migraine.  I have a lower suspicion for vertebral artery dissection or other acute vascular etiology given the relatively limited neurologic deficit and the fact that the neck pain is chronic. ? ?I have activated code stroke.  We will give labetalol to control the blood pressure, obtain a CT, lab work-up, and consult with  neurology. ? ?The patient is on the cardiac monitor to evaluate for evidence of arrhythmia and/or significant heart rate changes. ? ?----------------------------------------- ?4:25 PM on 02/10/2022 ?-------------------

## 2022-02-10 NOTE — ED Notes (Signed)
Bp now reading low. Neurologist back at bedside. ? ?Propofol and cleveprex paused. ?

## 2022-02-10 NOTE — ED Notes (Signed)
LKW was 12pm. L upper and L lower extremity weakness. ?

## 2022-02-10 NOTE — ED Triage Notes (Signed)
Pt to ED [facility] ?About 2 hr ago pt was in shower and had pain in L neck, shoulder and felt nauseous and had L arm and leg weakness ?Pt then began vomiting and dry heaving ? ?Pt is DM, HTN ?No hx stroke or MI ? ?Was 88% on RA, placed on 3L, then 90s ?A fib about 60 HR ? ?Has 20g R forearm ?Received '25mg'$  phenergan iv ? ?Pt stopped vomiting en route, now vomited again ? ?Performed bedside NIH, negative except mild ataxia LLE with finger to nose excercise ?

## 2022-02-10 NOTE — ED Notes (Signed)
EDP was just at bedside, giving fluid bolus now, 1L. Starting fentanyl and propofol was stopped. Pressure low. ?

## 2022-02-10 NOTE — Anesthesia Procedure Notes (Signed)
Central Venous Catheter Insertion ?Performed by: Santa Lighter, MD, anesthesiologist ?Start/End3/18/2023 10:20 PM, 02/10/2022 10:27 PM ?Patient location: OR. ?Preanesthetic checklist: patient identified, IV checked, site marked, risks and benefits discussed, surgical consent, monitors and equipment checked, pre-op evaluation, timeout performed and anesthesia consent ?Position: Trendelenburg ?Lidocaine 1% used for infiltration and patient sedated ?Hand hygiene performed , maximum sterile barriers used  and Seldinger technique used ?Catheter size: 8 Fr ?Total catheter length 16. ?Central line was placed.Double lumen ?Procedure performed using ultrasound guided technique. ?Ultrasound Notes:anatomy identified, needle tip was noted to be adjacent to the nerve/plexus identified, no ultrasound evidence of intravascular and/or intraneural injection and image(s) printed for medical record ?Attempts: 1 ?Following insertion, line sutured, dressing applied and Biopatch. ?Post procedure assessment: blood return through all ports, free fluid flow and no air ? ?Patient tolerated the procedure well with no immediate complications. ? ? ? ? ?

## 2022-02-10 NOTE — Anesthesia Preprocedure Evaluation (Addendum)
Anesthesia Evaluation  ?Patient identified by MRN, date of birth, ID band ?Patient unresponsive ? ? ? ?Reviewed: ?Allergy & Precautions, NPO status , Patient's Chart, lab work & pertinent test results ? ?Airway ?Mallampati: Intubated ? ?TM Distance: >3 FB ?Neck ROM: Full ? ? ? Dental ?  ?Pulmonary ?former smoker,  ?  ?breath sounds clear to auscultation ? ? ?+ intubated ? ? ? Cardiovascular ?hypertension, Pt. on medications ?Normal cardiovascular exam ?Rhythm:Regular Rate:Normal ? ? ?  ?Neuro/Psych ?Cerebellar hemorrhage ? ?negative psych ROS  ? GI/Hepatic ?Neg liver ROS, hiatal hernia, GERD  ,  ?Endo/Other  ?diabetes, Type 2, Oral Hypoglycemic AgentsObesity ? ? Renal/GU ?Renal InsufficiencyRenal disease  ? ?  ?Musculoskeletal ? ?(+) Arthritis ,  ? Abdominal ?  ?Peds ? Hematology ? ?(+) Blood dyscrasia (Eliquis), anemia ,   ?Anesthesia Other Findings ?Day of surgery medications reviewed with the patient. ? Reproductive/Obstetrics ? ?  ? ? ? ? ? ? ? ? ? ? ? ? ? ?  ?  ? ? ? ? ? ? ? ?Anesthesia Physical ?Anesthesia Plan ? ?ASA: 5 and emergent ? ?Anesthesia Plan: General  ? ?Post-op Pain Management:   ? ?Induction: Intravenous ? ?PONV Risk Score and Plan: 2 ? ?Airway Management Planned: Oral ETT ? ?Additional Equipment: Arterial line ? ?Intra-op Plan:  ? ?Post-operative Plan: Post-operative intubation/ventilation ? ?Informed Consent: I have reviewed the patients History and Physical, chart, labs and discussed the procedure including the risks, benefits and alternatives for the proposed anesthesia with the patient or authorized representative who has indicated his/her understanding and acceptance.  ? ? ? ?Only emergency history available ? ?Plan Discussed with: CRNA ? ?Anesthesia Plan Comments:   ? ? ? ? ? ?Anesthesia Quick Evaluation ? ?

## 2022-02-10 NOTE — ED Notes (Signed)
Chaplain at bedside

## 2022-02-10 NOTE — ED Notes (Signed)
Bolus '50mg'$  propofol given. Pt twitching extremities. ? ?Gave report to [facility]. ?

## 2022-02-10 NOTE — Consult Note (Signed)
? ?NAME:  Vernon [name], MRN:  [id number], DOB:  03-Mar-1946, LOS: 0 ?ADMISSION DATE:  02/10/2022, CONSULTATION DATE: February 10, 2022 ?REFERRING MD: Neurology service, CHIEF COMPLAINT: Headache and weakness ? ?History of Present Illness:  ?Patient came to here from [facility] intubated ?He was admitted by neuro service he has past medical history of hypertension anemia arthritis diabetes hiatal hernia who presented to an outside hospital with vomiting and extremity weakness and had history of A-fib on Eliquis mild dysplastic syndrome start feeling he is having difficulty with moving his left arm and leg he had a CAT scan which showed cerebellar hemorrhage later on he became hypoxic after he vomited and he was intubated for airway protection. ?When he came in here he had another CAT scan which showed increase in the size of cerebellar hemorrhage so he was started on hypertonic saline and neurology is calling neurosurgery. ? ?Objective   ?Blood pressure 118/76, pulse (!) 59, resp. rate 18, height 5' 9" (1.753 m), SpO2 95 %. ?   ?Vent Mode: PRVC ?FiO2 (%):  [60 %] 60 % ?Set Rate:  [16 bmp] 16 bmp ?Vt Set:  [500 mL-560 mL] 560 mL ?PEEP:  [5 cmH20] 5 cmH20 ?Plateau Pressure:  [13 cmH20-14 cmH20] 14 cmH20  ?No intake or output data in the 24 hours ending 02/10/22 2020 ?There were no vitals filed for this visit. ? ?Examination: ?General:  NAD , pleasant and hungry , wants to eat . ?Neuro:  WNL , AOX3 , EOMI , CN II-XII intact , UL , LL strength is symmetrical and 5/5 ?HEENT:  atraumatic , no jaundice , dry mucous membranes  ?Cardiovascular:  Irregular irregular , ESM 2/6 in the aortic area  ?Lungs:  CTA bilateral , no wheezing or crackles  ?Abdomen:  Soft lax +BS , no tenderness . ?Musculoskeletal:  WNL , normal pulses  ?Skin:  No rash   ? ? ? ?Assessment & Plan:  ? 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 ?-- Stop  all anticoagulation antithrombotic ? ? ?-- Keep the blood pressure below 140 he might need pressors if he is very hypotensive we will put him on fentanyl and propofol ? ? ?-- SCD for DVT prophylaxis and depending on his neuro course will be decided on the ventilator course. ?Labs   ?CBC: ?Recent Labs  ?Lab 02/10/22 ?1553 02/10/22 ?1956  ?WBC 8.2  --   ?NEUTROABS 7.4  --   ?HGB 8.9* 11.9*  ?HCT 28.4* 35.0*  ?MCV 95.6  --   ?PLT 163  --   ? ? ?Basic Metabolic Panel: ?Recent Labs  ?Lab 02/10/22 ?1553 02/10/22 ?1956  ?NA 137 138  ?K 4.7 4.8  ?CL 110  --   ?CO2 23  --   ?GLUCOSE 171*  --   ?BUN 33*  --   ?CREATININE 1.24  --   ?CALCIUM 9.1  --   ? ?GFR: ?Estimated Creatinine Clearance: 59.3 mL/min (by C-G formula based on SCr of 1.24 mg/dL). ?Recent Labs  ?Lab 02/10/22 ?1553  ?WBC 8.2  ? ? ?Liver Function Tests: ?Recent Labs  ?Lab 02/10/22 ?1553  ?AST 24  ?ALT 18  ?ALKPHOS 92  ?BILITOT 3.3*  ?PROT 7.2  ?ALBUMIN 3.9  ? ?No results for input(s): LIPASE, AMYLASE in the last 168 hours. ?No results for input(s): AMMONIA in the last 168 hours. ? ?ABG ?   ?Component Value Date/Time  ? PHART 7.247 (L) 02/10/2022  1956  ? PCO2ART 42.5 02/10/2022 1956  ? PO2ART 81 (L) 02/10/2022 1956  ? HCO3 18.6 (L) 02/10/2022 1956  ? TCO2 20 (L) 02/10/2022 1956  ? ACIDBASEDEF 8.0 (H) 02/10/2022 1956  ? O2SAT 94 02/10/2022 1956  ?  ? ?Coagulation Profile: ?Recent Labs  ?Lab 02/10/22 ?1553  ?INR 1.5*  ? ? ?Cardiac Enzymes: ?No results for input(s): CKTOTAL, CKMB, CKMBINDEX, TROPONINI in the last 168 hours. ? ?HbA1C: ?No results found for: HGBA1C ? ?CBG: ?Recent Labs  ?Lab 02/10/22 ?1548  ?GLUCAP 158*  ? ? ?Review of Systems:   ?Unable to obtain due to patient condition ? ?Past Medical History:  ?He,  has a past medical history of Anemia, Arthritis, Complication of anesthesia, Diabetes mellitus without complication ([place]), GERD (gastroesophageal reflux disease), History of hiatal hernia, and Hypertension.  ? ?Surgical History:  ? ?Past Surgical  History:  ?Procedure Laterality Date  ? CARPAL TUNNEL RELEASE  2012  ? JOINT REPLACEMENT Right 2010  ? SHOULDER ARTHROSCOPY WITH ROTATOR CUFF REPAIR AND OPEN BICEPS TENODESIS Right 11/09/2019  ? Procedure: RIGHT SHOULDER ARTHROSCOPY WITH SUBSCAPULARIS REPAIR, SUBACROMIAL DECOMPRESSION,MINI OPEN ROTATOR CUFF REPAIR;  Surgeon: Leim Fabry, MD;  Location: [facility] ORS;  Service: Orthopedics;  Laterality: Right;  ?  ? ?Social History:  ? reports that he quit smoking about 43 years ago. His smoking use included cigarettes. He has a 4.00 pack-year smoking history. He has never used smokeless tobacco. He reports current alcohol use. He reports that he does not use drugs.  ? ?Family History:  ?His family history includes Cancer in his maternal uncle.  ? ?Allergies ?No Known Allergies  ? ?Home Medications  ?Prior to Admission medications   ?Medication Sig Start Date End Date Taking? Authorizing Provider  ?acetaminophen (TYLENOL) 650 MG CR tablet Take 650 mg by mouth every 8 (eight) hours as needed for pain.    [provider]  ?allopurinol (ZYLOPRIM) 300 MG tablet Take 300 mg by mouth at bedtime.     [provider]  ?apixaban (ELIQUIS) 5 MG TABS tablet Take 5 mg by mouth 2 (two) times daily.  02/06/17   [provider]  ?blood glucose meter kit and supplies Use 1 kit as needed. 08/16/17   [provider]  ?Cyanocobalamin (VITAMIN B 12) 500 MCG TABS Take by mouth.    [provider]  ?ferrous sulfate 325 (65 FE) MG EC tablet Take 325 mg by mouth 2 (two) times daily. Patient reports takes once a day    [provider]  ?lisinopril (ZESTRIL) 40 MG tablet Take 40 mg by mouth at bedtime.     [provider]  ?metFORMIN (GLUCOPHAGE) 500 MG tablet Take 500 mg by mouth 2 (two) times daily with a meal.  09/19/18 02/17/26  [provider]  ?Multiple Vitamins-Minerals (CENTRUM SILVER PO) Take 1 tablet by mouth daily.    [provider]  ?simvastatin (ZOCOR) 80  MG tablet Take 40 mg by mouth daily at 6 PM.     [provider]  ?  ? ? ? ?  ?

## 2022-02-10 NOTE — H&P (Signed)
NEUROLOGY CONSULTATION NOTE  ? ?Date of service: February 10, 2022 ?Patient Name: Vernon [name] ?MRN:  [id number] ?DOB:  1946/08/24 ? ?_ _ _   _ __   _ __ _ _  __ __   _ __   __ _ ? ?History of Present Illness  ?Vernon [name] is a 76 y.o. male with PMH significant for anemia, arthritis, GERD, DM2, GERD, HTN, afibb on Eliquis who presented with worsening L side coordination along with feeling sick to his stomach and vomiting. He took his Eliquis in AM on 0900. Family took him to [facility] where STAT [facility] demonstrated a 2.2cm L cerebellar peduncle ICH. Eliquis was reversed with [name], SBP kept less than 150. His somnolence worsened at [facility] and he was intubated and started on propofol. ? ?On arrival, he was noted to have intact brainstem reflexes but noted to not be withdrawing BL upper extremities but would withdraw BL lower extremities. A stat CTH was obtained for further evaluation and notable for increase in the size of IPH with effaced 4th ventricle and basal cisterns. No hydrocephalus yet. ? ?LKW: 1200 on 02/10/22. ?mRS: 0 ?tNKASE/Thrombectomy: Not offered 2/2 ICH ?ICH score: 2. ?NIHSS components Score: Comment  ?1a Level of Conscious 0$RemoveBefor'[]'vRSowqQbbbmD$  '[]'$  2$R'[]'gu$  '[x]'$      ?1b LOC Questions 0$RemoveBefore'[]'oxfoKAmOISbTD$  '[]'$  2$R'[x]'XD$       ?1c LOC Commands 0$RemoveBefor'[]'cpSqcIEvtDTY$  '[]'$  2$R'[x]'XL$       ?2 Best Gaze 0$RemoveBe'[]'McQzziQTc$  '[]'$  2$R'[x]'Pe$       ?3 Visual 0$RemoveBe'[x]'YxyyAMEHU$  '[]'$  2$R'[]'qG$  '[]'$      ?4 Facial Palsy 0$RemoveBefor'[x]'FKcVeemEmdaS$  '[]'$  2$R'[]'LV$  '[]'$      ?5a Motor Arm - left 0$Remov'[x]'AfBaIz$  '[]'$  2$R'[]'lf$  '[x]'$  4$Re'[]'oDe$  U'[]'$    ?5b Motor Arm - Right 0$Remove'[]'tCholMN$  '[]'$  2$R'[]'ZM$  '[x]'$  4$Re'[]'GvR$  U'[]'$    ?6a Motor Leg - Left 0$Remov'[]'Tzuwhh$  '[]'$  2$R'[]'gS$  '[x]'$  4$Re'[]'aWI$  U'[]'$    ?6b Motor Leg - Right 0$Remove'[]'EotIxAG$  '[]'$  2$R'[]'Ou$  '[x]'$  4$Re'[]'nBR$  U'[]'$    ?7 Limb Ataxia 0$RemoveBefo'[]'coqCDjGkJuh$  '[]'$  2$R'[]'Rg$  '[]'$  UN$R'[x]'XW$     ?8 Sensory 0$RemoveBef'[x]'DPBwtCUWwH$  '[]'$  2$R'[]'EM$  U'[]'$      ?9 Best Language 0$RemoveBefore'[]'hKTKmghwMmata$  '[]'$  2$R'[]'Wg$  '[x]'$      ?10 Dysarthria 0$RemoveBeforeD'[]'irrvwUnfPuLNQw$  '[]'$  2$R'[x]'iH$  U'[]'$      ?11 Extinct. and Inattention 0$RemoveBeforeDE'[x]'qjSIoYCsBKTACLk$  '[]'$  2$R'[]'rE$       ?TOTAL:    ? ?  ?ROS  ? ?Unable to obtain given intubation and sedation. ? ?Past History  ? ?Past Medical History:  ?Diagnosis Date  ?? Anemia   ?? Arthritis   ?? Complication of anesthesia   ? hard time getting bp up  after knee replacement  ?? Diabetes mellitus without complication ([place])   ?? GERD (gastroesophageal reflux disease)   ? occ tums prn  ?? History of hiatal hernia   ?? Hypertension   ? ?Past Surgical History:  ?Procedure Laterality Date  ?? CARPAL TUNNEL RELEASE  2012  ?? JOINT REPLACEMENT Right 2010  ?? SHOULDER ARTHROSCOPY WITH ROTATOR CUFF REPAIR AND OPEN BICEPS TENODESIS Right 11/09/2019  ? Procedure: RIGHT SHOULDER ARTHROSCOPY WITH SUBSCAPULARIS REPAIR, SUBACROMIAL DECOMPRESSION,MINI OPEN ROTATOR CUFF REPAIR;  Surgeon: Leim Fabry, MD;  Location: [facility] ORS;  Service: Orthopedics;  Laterality: Right;  ? ?Family History  ?Problem Relation Age of Onset  ?? Cancer Maternal Uncle   ? ?Social History  ? ?Socioeconomic History  ?? Marital status: Married  ?  Spouse name: Not on file  ?? Number of children: Not on file  ?? Years of education: Not on file  ?? Highest education level: Not on file  ?Occupational History  ?? Not on file  ?  Tobacco Use  ?? Smoking status: Former  ?  Packs/day: 0.50  ?  Years: 8.00  ?  Pack years: 4.00  ?  Types: Cigarettes  ?  Quit date: 07/21/1978  ?  Years since quitting: 43.5  ?? Smokeless tobacco: Never  ?Substance and Sexual Activity  ?? Alcohol use: Yes  ?  Alcohol/week: 0.0 - 1.0 standard drinks  ?  Comment: rare beer  ?? Drug use: Never  ?? Sexual activity: Not on file  ?Other Topics Concern  ?? Not on file  ?Social History Narrative  ? Lives in [place]; with wife; quit smoking in early 37s; ocassional/ rare [may be 1 a month beer]. 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   ? ?Social Determinants of Health  ? ?Financial Resource Strain: Not on file  ?Food Insecurity: Not on file  ?Transportation Needs: Not on file  ?Physical Activity: Not on file  ?Stress: Not on file  ?Social Connections: Not on file  ? ?No Known Allergies ? ?Medications  ? ?Medications Prior to Admission  ?Medication Sig Dispense Refill Last Dose  ?? acetaminophen (TYLENOL) 650 MG CR tablet Take 650 mg by mouth  every 8 (eight) hours as needed for pain.     ?? allopurinol (ZYLOPRIM) 300 MG tablet Take 300 mg by mouth at bedtime.      ?? apixaban (ELIQUIS) 5 MG TABS tablet Take 5 mg by mouth 2 (two) times daily.      ?? blood glucose meter kit and supplies Use 1 kit as needed.     ?? Cyanocobalamin (VITAMIN B 12) 500 MCG TABS Take by mouth.     ?? ferrous sulfate 325 (65 FE) MG EC tablet Take 325 mg by mouth 2 (two) times daily. Patient reports takes once a day     ?? lisinopril (ZESTRIL) 40 MG tablet Take 40 mg by mouth at bedtime.      ?? metFORMIN (GLUCOPHAGE) 500 MG tablet Take 500 mg by mouth 2 (two) times daily with a meal.      ?? Multiple Vitamins-Minerals (CENTRUM SILVER PO) Take 1 tablet by mouth daily.     ?? simvastatin (ZOCOR) 80 MG tablet Take 40 mg by mouth daily at 6 PM.      ?  ? ?Vitals  ? ?Vitals:  ? 02/10/22 1840 02/10/22 1841  ?BP: 118/76   ?Pulse: (!) 52   ?Resp: 14   ?SpO2: 98% 98%  ?Height:  $RemoveB'5\' 9"'ZNshIYox$  (1.753 m)  ?  ? ?Body mass index is 31.75 kg/m?. ? ?Physical Exam  ? ?General: Laying comfortably in bed; attempting to get up. ?HENT: Normal oropharynx and mucosa. Normal external appearance of ears and nose. ?Neck: Supple, no pain or tenderness ?CV: No JVD. No peripheral edema. ?Pulmonary: Symmetric Chest rise. Normal respiratory effort. ?Abdomen: Soft to touch, non-tender. ?Ext: No cyanosis, edema, or deformity ?Skin: No rash. Normal palpation of skin.  ?Musculoskeletal: Normal digits and nails by inspection. No clubbing. ? ?Neurologic Examination  ?Mental status/Cognition: eyes closed, opens eyes to vigorous tactile stimulation. Does not make eye contact. ?Speech/language: no speech, intubation and sedation precludes detailed assessment of speech. No attempts to communicate. ?Cranial nerves:  ? CN II L pupil slightly larger than right. Both pupils are round and reactive to light. Unable to assess for VF deficit.  ? CN III,IV,VI EOM intact to dolls eyes, no gaze preference or deviation, no nystagmus  ? CN  V Corneals intact BL  ? CN VII no asymmetry, no nasolabial fold  flattening. Symmetric facial grimace.  ? CN VIII Does not turn head towards speech  ? CN IX & X Cough and gag intact.  ? CN XI Head midline  ? CN XII midline tongue but does not protrude on command.  ? ?Motor:  ?Muscle bulk: normal, tone normal. ?Unable to do detailed strength testing secondary to intubation and sedation but moves all extremities spontaneously. ? ?Sensation: ? Light touch Localizes to pain in all extremities.  ? Pin prick   ? Temperature   ? Vibration   ?Proprioception   ? ?Coordination/Complex Motor:  ?Unable to assess. ?Gait: unsafe to assess and thus deferred. ? ?Labs  ? ?CBC:  ?Recent Labs  ?Lab 02/10/22 ?1553  ?WBC 8.2  ?NEUTROABS 7.4  ?HGB 8.9*  ?HCT 28.4*  ?MCV 95.6  ?PLT 163  ? ? ?Basic Metabolic Panel:  ?Lab Results  ?Component Value Date  ? NA 137 02/10/2022  ? K 4.7 02/10/2022  ? CO2 23 02/10/2022  ? GLUCOSE 171 (H) 02/10/2022  ? BUN 33 (H) 02/10/2022  ? CREATININE 1.24 02/10/2022  ? CALCIUM 9.1 02/10/2022  ? GFRNONAA >60 02/10/2022  ? GFRAA >60 07/28/2020  ? ?Lipid Panel: No results found for: [place] ?HgbA1c: No results found for: HGBA1C ?Urine Drug Screen: No results found for: LABOPIA, COCAINSCRNUR, [place], [place], THCU, LABBARB  ?Alcohol Level No results found for: ETH ? ?CT Head without contrast(Personally reviewed): ?Acute 2.2 cm intraparenchymal hemorrhage in the left middle ?cerebellar peduncle with probable small volume adjacent extra-axial ?extension. Surrounding edema with mild effacement of the fourth ?ventricle. No hydrocephalus. While nonspecific, consider follow-up ?MRI with contrast after resolution of hemorrhage to exclude ?underlying lesion. ? ? ?Repeat CTH without contrast(Personally reviewed): ?1. Increased size of intraparenchymal hematoma in the left ?cerebellum, with effaced fourth ventricle and basal cisterns. ?2. No hydrocephalus, but risk of developing obstructive ?hydrocephalus is high. ?3.  Increased subdural blood along the posterior falx cerebri. ? ?CT angio Head and Neck with contrast(Personally reviewed): ?No LVO ? ?MRI Brain(Personally reviewed): ?Pending. ? ?Impression  ? ?Tad Moore

## 2022-02-10 NOTE — ED Notes (Signed)
Per neurologist, only give ordered labetalol if SBP at or above 150. ?

## 2022-02-10 NOTE — ED Notes (Signed)
Titrated to 4L and changed SPO2 site to ear. ?

## 2022-02-10 NOTE — Consult Note (Signed)
Reason for Consult: Cerebellar hemorrhage ?Referring Physician: Dr. Milas Gain ? ?Vernon [name] is an 76 y.o. male.  ?HPI: Patient is a 76 year old right-handed individual who presented with some difficulty with coordination earlier today.  He was taken to [facility] where a CT scan showed the presence of a small cerebellar hemorrhage.  He has been on Eliquis anticoagulation for atrial fibrillation and this was reversed.  He was transferred to [facility] hospital for further monitoring and during that time he became progressively more obtunded requiring ventilatory assistance.  Repeat CT demonstrates that the cerebellar hemorrhage had increased in size to about 3 or 4 times larger than it had been creating shift and mass effect and early hydrocephalus.  Because of the acute deteriorating neurologic status I was consulted regarding emergent surgical decompression. ? ?Past Medical History:  ?Diagnosis Date  ? Anemia   ? Arthritis   ? Complication of anesthesia   ? hard time getting bp up after knee replacement  ? Diabetes mellitus without complication ([place])   ? GERD (gastroesophageal reflux disease)   ? occ tums prn  ? History of hiatal hernia   ? Hypertension   ? ? ?Past Surgical History:  ?Procedure Laterality Date  ? CARPAL TUNNEL RELEASE  2012  ? JOINT REPLACEMENT Right 2010  ? SHOULDER ARTHROSCOPY WITH ROTATOR CUFF REPAIR AND OPEN BICEPS TENODESIS Right 11/09/2019  ? Procedure: RIGHT SHOULDER ARTHROSCOPY WITH SUBSCAPULARIS REPAIR, SUBACROMIAL DECOMPRESSION,MINI OPEN ROTATOR CUFF REPAIR;  Surgeon: Leim Fabry, MD;  Location: [facility] ORS;  Service: Orthopedics;  Laterality: Right;  ? ? ?Family History  ?Problem Relation Age of Onset  ? Cancer Maternal Uncle   ? ? ?Social History:  reports that he quit smoking about 43 years ago. His smoking use included cigarettes. He has a 4.00 pack-year smoking history. He has never used smokeless tobacco. He reports current alcohol use. He reports that he does not use  drugs. ? ?Allergies: No Known Allergies ? ?Medications: I have not reviewed the patient's medications ? ?Results for orders placed or performed during the hospital encounter of 02/10/22 (from the past 48 hour(s))  ?MRSA Next Gen by PCR, Nasal     Status: None  ? Collection Time: 02/10/22  6:42 PM  ? Specimen: Nasal Mucosa; Nasal Swab  ?Result Value Ref Range  ? MRSA by PCR Next Gen NOT DETECTED NOT DETECTED  ?  Comment: (NOTE) ?The GeneXpert MRSA Assay (FDA approved for NASAL specimens only), ?is one component of a comprehensive MRSA colonization surveillance ?program. It is not intended to diagnose MRSA infection nor to guide ?or monitor treatment for MRSA infections. ?Test performance is not FDA approved in patients less than 2 years ?old. ?Performed at [facility], [place] [address], [place], [place] ?[address] ?  ?I-STAT 7, (LYTES, BLD GAS, ICA, H+H)     Status: Abnormal  ? Collection Time: 02/10/22  7:56 PM  ?Result Value Ref Range  ? pH, Arterial 7.247 (L) 7.35 - 7.45  ? pCO2 arterial 42.5 32 - 48 mmHg  ? pO2, Arterial 81 (L) 83 - 108 mmHg  ? Bicarbonate 18.6 (L) 20.0 - 28.0 mmol/L  ? TCO2 20 (L) 22 - 32 mmol/L  ? O2 Saturation 94 %  ? Acid-base deficit 8.0 (H) 0.0 - 2.0 mmol/L  ? Sodium 138 135 - 145 mmol/L  ? Potassium 4.8 3.5 - 5.1 mmol/L  ? Calcium, Ion 1.32 1.15 - 1.40 mmol/L  ? HCT 35.0 (L) 39.0 - 52.0 %  ? Hemoglobin  11.9 (L) 13.0 - 17.0 g/dL  ? Patient temperature 36.3 C   ? Collection site RADIAL, ALLEN'S TEST ACCEPTABLE   ? Drawn by Operator   ? Sample type ARTERIAL   ? ? ?CT HEAD WO CONTRAST (5MM) ? ?Result Date: 02/10/2022 ?CLINICAL DATA:  Intracranial hemorrhage EXAM: CT HEAD WITHOUT CONTRAST TECHNIQUE: Contiguous axial images were obtained from the base of the skull through the vertex without intravenous contrast. RADIATION DOSE REDUCTION: This exam was performed according to the departmental dose-optimization program which includes automated exposure control, adjustment of the mA and/or kV  according to patient size and/or use of iterative reconstruction technique. COMPARISON:  02/10/2022 FINDINGS: Brain: 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 hematoma in the left cerebellum has enlarged and now measures 3.6 x 1.8 cm, previously 2.1 x 2.2 cm. The fourth ventricle is effaced. There is some increased subdural blood along the posterior falx cerebri. The basal cisterns are effaced. There is periventricular hypoattenuation compatible with chronic microvascular disease. Vascular: Negative Skull: Normal. Negative for fracture or focal lesion. Sinuses/Orbits: No acute finding. Other: None. IMPRESSION: 1. Increased size of intraparenchymal hematoma in the left cerebellum, with effaced fourth ventricle and basal cisterns. 2. No hydrocephalus, but risk of developing obstructive hydrocephalus is high. 3. Increased subdural blood along the posterior falx cerebri. These results were communicated to Dr. Donnetta Simpers at 7:45 pm on 02/10/2022 by text page via the [facility] messaging system. Electronically Signed   By: Ulyses Jarred M.D.   On: 02/10/2022 19:46  ? ?DG CHEST PORT 1 VIEW ? ?Result Date: 02/10/2022 ?CLINICAL DATA:  Respiratory failure EXAM: PORTABLE CHEST 1 VIEW COMPARISON:  None. FINDINGS: Endotracheal tube is in place 9 mm above the carina. Retaining cuff mildly distends the trachea. Nasogastric tube extends into the upper abdomen beyond the margin of the examination. Bibasilar pulmonary infiltrates are present, infection versus edema. No pneumothorax or pleural effusion. Cardiac size is at the upper limits of normal. No acute bone abnormality. IMPRESSION: Endotracheal tube 9 mm above the carina. Withdrawal by 1-2 cm may more optimally position the catheter. Endotracheal tube retaining cuff appears to distend the trachea and revision may be warranted. Bibasilar pulmonary infiltrate, infection versus edema. Electronically Signed   By: Fidela Salisbury M.D.   On: 02/10/2022 20:21  ? ?CT HEAD CODE STROKE WO CONTRAST ? ?Result Date:  02/10/2022 ?CLINICAL DATA:  Code stroke.  Neuro deficit, acute, stroke suspected EXAM: CT HEAD WITHOUT CONTRAST TECHNIQUE: Contiguous axial images were obtained from the base of the skull through the vertex without intravenous contrast. RADIATION DOSE REDUCTION: This exam was performed according to the departmental dose-optimization program which includes automated exposure control, adjustment of the mA and/or kV according to patient size and/or use of iterative reconstruction technique. COMPARISON:  CT of the head October 07, 2019. FINDINGS: Brain: Approximately 2.1 x 2.2 cm area of acute intraparenchymal hemorrhage in the left middle cerebellar peduncle with probable adjacent extra-axial extension. Surrounding edema with mild effacement the fourth ventricle. No hydrocephalus. Mild patchy white matter hypoattenuation supratentorially, nonspecific but probably related to chronic microvascular ischemic disease. No midline shift. Vascular: No hyperdense vessel identified. Calcific intracranial atherosclerosis. Skull: No acute fracture. Sinuses/Orbits: Clear visualized sinuses.  No acute orbital findings Other: No mastoid effusions. IMPRESSION: Acute 2.2 cm intraparenchymal hemorrhage in the left middle cerebellar peduncle with probable small volume adjacent extra-axial extension. Surrounding edema with mild effacement of the fourth ventricle. No hydrocephalus. While nonspecific, consider follow-up MRI with contrast after resolution of hemorrhage to exclude underlying lesion. Findings discussed with Dr.  Stack via telephone at 4:10 p.m. Electronically Signed   By: Margaretha Sheffield M.D.   On: 02/10/2022 16:17  ? ?CT ANGIO HEAD NECK W WO CM (CODE STROKE) ? ?Result Date: 02/10/2022 ?CLINICAL DATA:  Neuro deficit, acute, stroke suspected r/o vascular malformation - ICH EXAM: CT ANGIOGRAPHY HEAD AND NECK TECHNIQUE: Multidetector CT imaging of the head and neck was performed using the standard protocol during bolus  administration of intravenous contrast. Multiplanar CT image reconstructions and MIPs were obtained to evaluate the vascular anatomy. Carotid stenosis measurements (when applicable) are obtained utilizing NASCET criteria, using th

## 2022-02-10 NOTE — ED Notes (Signed)
EDP at bedside  

## 2022-02-10 NOTE — ED Notes (Signed)
Per EDP, start propofol at 30mg/kg/hr. ?

## 2022-02-10 NOTE — ED Notes (Signed)
EDP and RT intubating pt. ?

## 2022-02-11 ENCOUNTER — Encounter (HOSPITAL_COMMUNITY): Payer: Self-pay | Admitting: Neurology

## 2022-02-11 ENCOUNTER — Inpatient Hospital Stay (HOSPITAL_COMMUNITY): Payer: Medicare Other

## 2022-02-11 DIAGNOSIS — I4821 Permanent atrial fibrillation: Secondary | ICD-10-CM

## 2022-02-11 DIAGNOSIS — I952 Hypotension due to drugs: Secondary | ICD-10-CM

## 2022-02-11 DIAGNOSIS — I6389 Other cerebral infarction: Secondary | ICD-10-CM

## 2022-02-11 DIAGNOSIS — Z9889 Other specified postprocedural states: Secondary | ICD-10-CM | POA: Diagnosis not present

## 2022-02-11 DIAGNOSIS — I614 Nontraumatic intracerebral hemorrhage in cerebellum: Secondary | ICD-10-CM

## 2022-02-11 DIAGNOSIS — N179 Acute kidney failure, unspecified: Secondary | ICD-10-CM

## 2022-02-11 DIAGNOSIS — G911 Obstructive hydrocephalus: Secondary | ICD-10-CM | POA: Diagnosis not present

## 2022-02-11 LAB — LIPID PANEL
Cholesterol: 68 mg/dL (ref 0–200)
HDL: 27 mg/dL — ABNORMAL LOW (ref >40)
LDL Cholesterol: 25 mg/dL (ref 0–99)
Total CHOL/HDL Ratio: 2.5 RATIO
Triglycerides: 79 mg/dL (ref <150)
VLDL: 16 mg/dL (ref 0–40)

## 2022-02-11 LAB — ECHOCARDIOGRAM COMPLETE
AR max vel: 1.12 cm2
AV Area VTI: 1.03 cm2
AV Area mean vel: 1.21 cm2
AV Mean grad: 19 mmHg
AV Peak grad: 41 mmHg
Ao pk vel: 3.2 m/s
Height: 69 in
S' Lateral: 3.5 cm
Weight: 3440 oz

## 2022-02-11 LAB — TRIGLYCERIDES: Triglycerides: 77 mg/dL (ref <150)

## 2022-02-11 LAB — BASIC METABOLIC PANEL
Anion gap: 7 (ref 5–15)
Anion gap: 8 (ref 5–15)
BUN: 34 mg/dL — ABNORMAL HIGH (ref 8–23)
BUN: 37 mg/dL — ABNORMAL HIGH (ref 8–23)
CO2: 18 mmol/L — ABNORMAL LOW (ref 22–32)
CO2: 18 mmol/L — ABNORMAL LOW (ref 22–32)
Calcium: 8.4 mg/dL — ABNORMAL LOW (ref 8.9–10.3)
Calcium: 8 mg/dL — ABNORMAL LOW (ref 8.9–10.3)
Chloride: 117 mmol/L — ABNORMAL HIGH (ref 98–111)
Chloride: 123 mmol/L — ABNORMAL HIGH (ref 98–111)
Creatinine, Ser: 1.62 mg/dL — ABNORMAL HIGH (ref 0.61–1.24)
Creatinine, Ser: 1.73 mg/dL — ABNORMAL HIGH (ref 0.61–1.24)
GFR, Estimated: 44 mL/min — ABNORMAL LOW (ref >60)
GFR, Estimated: 41 mL/min — ABNORMAL LOW (ref >60)
Glucose, Bld: 146 mg/dL — ABNORMAL HIGH (ref 70–99)
Glucose, Bld: 136 mg/dL — ABNORMAL HIGH (ref 70–99)
Potassium: 5.5 mmol/L — ABNORMAL HIGH (ref 3.5–5.1)
Potassium: 5.6 mmol/L — ABNORMAL HIGH (ref 3.5–5.1)
Sodium: 142 mmol/L (ref 135–145)
Sodium: 149 mmol/L — ABNORMAL HIGH (ref 135–145)

## 2022-02-11 LAB — POCT I-STAT 7, (LYTES, BLD GAS, ICA,H+H)
Acid-base deficit: 7 mmol/L — ABNORMAL HIGH (ref 0.0–2.0)
Acid-base deficit: 8 mmol/L — ABNORMAL HIGH (ref 0.0–2.0)
Acid-base deficit: 7 mmol/L — ABNORMAL HIGH (ref 0.0–2.0)
Acid-base deficit: 7 mmol/L — ABNORMAL HIGH (ref 0.0–2.0)
Bicarbonate: 18.3 mmol/L — ABNORMAL LOW (ref 20.0–28.0)
Bicarbonate: 19.1 mmol/L — ABNORMAL LOW (ref 20.0–28.0)
Bicarbonate: 19.7 mmol/L — ABNORMAL LOW (ref 20.0–28.0)
Bicarbonate: 19.4 mmol/L — ABNORMAL LOW (ref 20.0–28.0)
Calcium, Ion: 1.26 mmol/L (ref 1.15–1.40)
Calcium, Ion: 1.3 mmol/L (ref 1.15–1.40)
Calcium, Ion: 1.25 mmol/L (ref 1.15–1.40)
Calcium, Ion: 1.27 mmol/L (ref 1.15–1.40)
HCT: 30 % — ABNORMAL LOW (ref 39.0–52.0)
HCT: 31 % — ABNORMAL LOW (ref 39.0–52.0)
HCT: 28 % — ABNORMAL LOW (ref 39.0–52.0)
HCT: 28 % — ABNORMAL LOW (ref 39.0–52.0)
Hemoglobin: 10.2 g/dL — ABNORMAL LOW (ref 13.0–17.0)
Hemoglobin: 10.5 g/dL — ABNORMAL LOW (ref 13.0–17.0)
Hemoglobin: 9.5 g/dL — ABNORMAL LOW (ref 13.0–17.0)
Hemoglobin: 9.5 g/dL — ABNORMAL LOW (ref 13.0–17.0)
O2 Saturation: 100 %
O2 Saturation: 100 %
O2 Saturation: 91 %
O2 Saturation: 96 %
Patient temperature: 36.1
Patient temperature: 36.2
Potassium: 6.1 mmol/L — ABNORMAL HIGH (ref 3.5–5.1)
Potassium: 6.1 mmol/L — ABNORMAL HIGH (ref 3.5–5.1)
Potassium: 5.7 mmol/L — ABNORMAL HIGH (ref 3.5–5.1)
Potassium: 5.5 mmol/L — ABNORMAL HIGH (ref 3.5–5.1)
Sodium: 142 mmol/L (ref 135–145)
Sodium: 142 mmol/L (ref 135–145)
Sodium: 143 mmol/L (ref 135–145)
Sodium: 144 mmol/L (ref 135–145)
TCO2: 19 mmol/L — ABNORMAL LOW (ref 22–32)
TCO2: 20 mmol/L — ABNORMAL LOW (ref 22–32)
TCO2: 21 mmol/L — ABNORMAL LOW (ref 22–32)
TCO2: 21 mmol/L — ABNORMAL LOW (ref 22–32)
pCO2 arterial: 38.5 mmHg (ref 32–48)
pCO2 arterial: 39.8 mmHg (ref 32–48)
pCO2 arterial: 41 mmHg (ref 32–48)
pCO2 arterial: 41.5 mmHg (ref 32–48)
pH, Arterial: 7.286 — ABNORMAL LOW (ref 7.35–7.45)
pH, Arterial: 7.29 — ABNORMAL LOW (ref 7.35–7.45)
pH, Arterial: 7.285 — ABNORMAL LOW (ref 7.35–7.45)
pH, Arterial: 7.274 — ABNORMAL LOW (ref 7.35–7.45)
pO2, Arterial: 220 mmHg — ABNORMAL HIGH (ref 83–108)
pO2, Arterial: 249 mmHg — ABNORMAL HIGH (ref 83–108)
pO2, Arterial: 66 mmHg — ABNORMAL LOW (ref 83–108)
pO2, Arterial: 87 mmHg (ref 83–108)

## 2022-02-11 LAB — CBC
HCT: 28.7 % — ABNORMAL LOW (ref 39.0–52.0)
Hemoglobin: 9.3 g/dL — ABNORMAL LOW (ref 13.0–17.0)
MCH: 30.5 pg (ref 26.0–34.0)
MCHC: 32.4 g/dL (ref 30.0–36.0)
MCV: 94.1 fL (ref 80.0–100.0)
Platelets: 270 10*3/uL (ref 150–400)
RBC: 3.05 MIL/uL — ABNORMAL LOW (ref 4.22–5.81)
RDW: 29 % — ABNORMAL HIGH (ref 11.5–15.5)
WBC: 9.6 10*3/uL (ref 4.0–10.5)
nRBC: 0.6 % — ABNORMAL HIGH (ref 0.0–0.2)

## 2022-02-11 LAB — SODIUM
Sodium: 144 mmol/L (ref 135–145)
Sodium: 145 mmol/L (ref 135–145)
Sodium: 151 mmol/L — ABNORMAL HIGH (ref 135–145)

## 2022-02-11 LAB — HEMOGLOBIN A1C
Hgb A1c MFr Bld: 6.2 % — ABNORMAL HIGH (ref 4.8–5.6)
Mean Plasma Glucose: 131.24 mg/dL

## 2022-02-11 LAB — PHOSPHORUS
Phosphorus: 4.9 mg/dL — ABNORMAL HIGH (ref 2.5–4.6)
Phosphorus: 4.6 mg/dL (ref 2.5–4.6)

## 2022-02-11 LAB — GLUCOSE, CAPILLARY
Glucose-Capillary: 137 mg/dL — ABNORMAL HIGH (ref 70–99)
Glucose-Capillary: 137 mg/dL — ABNORMAL HIGH (ref 70–99)
Glucose-Capillary: 169 mg/dL — ABNORMAL HIGH (ref 70–99)
Glucose-Capillary: 145 mg/dL — ABNORMAL HIGH (ref 70–99)

## 2022-02-11 LAB — MAGNESIUM
Magnesium: 1.6 mg/dL — ABNORMAL LOW (ref 1.7–2.4)
Magnesium: 1.6 mg/dL — ABNORMAL LOW (ref 1.7–2.4)

## 2022-02-11 MED ORDER — BISACODYL 10 MG RE SUPP
10.0000 mg | Freq: Every day | RECTAL | Status: DC | PRN
  Administered 2022-02-14: 10 mg via RECTAL
  Filled 2022-02-11: qty 1

## 2022-02-11 MED ORDER — PROSOURCE TF PO LIQD
45.0000 mL | Freq: Two times a day (BID) | ORAL | Status: DC
  Administered 2022-02-11 – 2022-02-12 (×3): 45 mL
  Filled 2022-02-11 (×3): qty 45

## 2022-02-11 MED ORDER — PANTOPRAZOLE SODIUM 40 MG IV SOLR
40.0000 mg | Freq: Every day | INTRAVENOUS | Status: DC
  Administered 2022-02-11 – 2022-02-12 (×2): 40 mg via INTRAVENOUS
  Filled 2022-02-11 (×2): qty 10

## 2022-02-11 MED ORDER — ONDANSETRON HCL 4 MG/2ML IJ SOLN
INTRAMUSCULAR | Status: AC
  Filled 2022-02-11: qty 2

## 2022-02-11 MED ORDER — PHENYLEPHRINE 40 MCG/ML (10ML) SYRINGE FOR IV PUSH (FOR BLOOD PRESSURE SUPPORT)
PREFILLED_SYRINGE | INTRAVENOUS | Status: AC
  Filled 2022-02-11: qty 10

## 2022-02-11 MED ORDER — SODIUM BICARBONATE 8.4 % IV SOLN
INTRAVENOUS | Status: DC | PRN
  Administered 2022-02-11: 50 meq via INTRAVENOUS

## 2022-02-11 MED ORDER — POLYETHYLENE GLYCOL 3350 17 G PO PACK: 17.0000 g | PACK | Freq: Every day | ORAL | Status: DC | PRN

## 2022-02-11 MED ORDER — SODIUM ZIRCONIUM CYCLOSILICATE 10 G PO PACK
10.0000 g | PACK | Freq: Once | ORAL | Status: AC
  Administered 2022-02-11: 10 g
  Filled 2022-02-11: qty 1

## 2022-02-11 MED ORDER — FLEET ENEMA 7-19 GM/118ML RE ENEM
1.0000 | ENEMA | Freq: Once | RECTAL | Status: AC | PRN
  Administered 2022-02-14: 1 via RECTAL
  Filled 2022-02-11: qty 1

## 2022-02-11 MED ORDER — EPHEDRINE 5 MG/ML INJ
INTRAVENOUS | Status: AC
  Filled 2022-02-11: qty 5

## 2022-02-11 MED ORDER — INSULIN ASPART 100 UNIT/ML IJ SOLN
0.0000 [IU] | INTRAMUSCULAR | Status: DC
  Administered 2022-02-11: 3 [IU] via SUBCUTANEOUS
  Administered 2022-02-11 – 2022-02-12 (×4): 2 [IU] via SUBCUTANEOUS
  Administered 2022-02-12 – 2022-02-13 (×5): 3 [IU] via SUBCUTANEOUS
  Administered 2022-02-13: 2 [IU] via SUBCUTANEOUS
  Administered 2022-02-13 (×2): 3 [IU] via SUBCUTANEOUS
  Administered 2022-02-13: 2 [IU] via SUBCUTANEOUS
  Administered 2022-02-13: 3 [IU] via SUBCUTANEOUS
  Administered 2022-02-14: 2 [IU] via SUBCUTANEOUS
  Administered 2022-02-14: 5 [IU] via SUBCUTANEOUS
  Administered 2022-02-14: 8 [IU] via SUBCUTANEOUS
  Administered 2022-02-14 (×2): 3 [IU] via SUBCUTANEOUS
  Administered 2022-02-14: 5 [IU] via SUBCUTANEOUS
  Administered 2022-02-15: 2 [IU] via SUBCUTANEOUS
  Administered 2022-02-15 (×2): 3 [IU] via SUBCUTANEOUS
  Administered 2022-02-15: 5 [IU] via SUBCUTANEOUS
  Administered 2022-02-15 (×2): 3 [IU] via SUBCUTANEOUS
  Administered 2022-02-15: 2 [IU] via SUBCUTANEOUS
  Administered 2022-02-16: 3 [IU] via SUBCUTANEOUS
  Administered 2022-02-16: 5 [IU] via SUBCUTANEOUS
  Administered 2022-02-16: 3 [IU] via SUBCUTANEOUS
  Administered 2022-02-16: 5 [IU] via SUBCUTANEOUS
  Administered 2022-02-16 – 2022-02-17 (×6): 3 [IU] via SUBCUTANEOUS
  Administered 2022-02-17: 5 [IU] via SUBCUTANEOUS
  Administered 2022-02-18: 3 [IU] via SUBCUTANEOUS
  Administered 2022-02-18: 2 [IU] via SUBCUTANEOUS
  Administered 2022-02-18 – 2022-02-19 (×5): 3 [IU] via SUBCUTANEOUS
  Administered 2022-02-19: 2 [IU] via SUBCUTANEOUS
  Administered 2022-02-19: 3 [IU] via SUBCUTANEOUS
  Administered 2022-02-19 (×2): 2 [IU] via SUBCUTANEOUS
  Administered 2022-02-19: 3 [IU] via SUBCUTANEOUS
  Administered 2022-02-20: 5 [IU] via SUBCUTANEOUS
  Administered 2022-02-20 (×3): 3 [IU] via SUBCUTANEOUS
  Administered 2022-02-20: 2 [IU] via SUBCUTANEOUS
  Administered 2022-02-20 – 2022-02-21 (×2): 3 [IU] via SUBCUTANEOUS
  Administered 2022-02-21: 2 [IU] via SUBCUTANEOUS
  Administered 2022-02-21: 3 [IU] via SUBCUTANEOUS
  Administered 2022-02-21: 2 [IU] via SUBCUTANEOUS
  Administered 2022-02-21: 3 [IU] via SUBCUTANEOUS
  Administered 2022-02-22: 2 [IU] via SUBCUTANEOUS
  Administered 2022-02-22 (×4): 3 [IU] via SUBCUTANEOUS
  Administered 2022-02-23: 2 [IU] via SUBCUTANEOUS
  Administered 2022-02-23: 3 [IU] via SUBCUTANEOUS
  Administered 2022-02-23 – 2022-02-24 (×4): 2 [IU] via SUBCUTANEOUS
  Administered 2022-02-24 (×3): 3 [IU] via SUBCUTANEOUS
  Administered 2022-02-24: 2 [IU] via SUBCUTANEOUS
  Administered 2022-02-24: 3 [IU] via SUBCUTANEOUS
  Administered 2022-02-25: 2 [IU] via SUBCUTANEOUS
  Administered 2022-02-25: 3 [IU] via SUBCUTANEOUS
  Administered 2022-02-25: 2 [IU] via SUBCUTANEOUS
  Administered 2022-02-25 – 2022-02-26 (×4): 3 [IU] via SUBCUTANEOUS
  Administered 2022-02-26: 2 [IU] via SUBCUTANEOUS
  Administered 2022-02-26: 3 [IU] via SUBCUTANEOUS

## 2022-02-11 MED ORDER — LEVETIRACETAM IN NACL 500 MG/100ML IV SOLN
500.0000 mg | Freq: Two times a day (BID) | INTRAVENOUS | Status: DC
  Administered 2022-02-11 – 2022-02-18 (×15): 500 mg via INTRAVENOUS
  Filled 2022-02-11 (×15): qty 100

## 2022-02-11 MED ORDER — ONDANSETRON HCL 4 MG PO TABS: 4.0000 mg | ORAL_TABLET | ORAL | Status: DC | PRN

## 2022-02-11 MED ORDER — PROPOFOL 1000 MG/100ML IV EMUL
INTRAVENOUS | Status: AC
  Filled 2022-02-11: qty 100

## 2022-02-11 MED ORDER — MICROFIBRILLAR COLL HEMOSTAT EX PADS
MEDICATED_PAD | CUTANEOUS | Status: DC | PRN
  Administered 2022-02-11: 1 via TOPICAL

## 2022-02-11 MED ORDER — ONDANSETRON HCL 4 MG/2ML IJ SOLN: 4.0000 mg | INTRAMUSCULAR | Status: DC | PRN

## 2022-02-11 MED ORDER — ONDANSETRON HCL 4 MG/2ML IJ SOLN
INTRAMUSCULAR | Status: DC | PRN
  Administered 2022-02-11: 4 mg via INTRAVENOUS

## 2022-02-11 MED ORDER — VITAL HIGH PROTEIN PO LIQD
1000.0000 mL | ORAL | Status: DC
  Administered 2022-02-11: 1000 mL

## 2022-02-11 MED ORDER — SODIUM CHLORIDE 23.4 % INJECTION (4 MEQ/ML) FOR IV ADMINISTRATION
120.0000 meq | Freq: Once | INTRAVENOUS | Status: AC
  Administered 2022-02-11: 120 meq via INTRAVENOUS
  Filled 2022-02-11: qty 30

## 2022-02-11 MED ORDER — MAGNESIUM SULFATE 4 GM/100ML IV SOLN
4.0000 g | Freq: Once | INTRAVENOUS | Status: AC
  Administered 2022-02-11: 4 g via INTRAVENOUS
  Filled 2022-02-11: qty 100

## 2022-02-11 MED ORDER — SODIUM CHLORIDE 3 % IV SOLN
INTRAVENOUS | Status: DC
  Filled 2022-02-11: qty 500

## 2022-02-11 MED ORDER — CALCIUM CHLORIDE 10 % IV SOLN
INTRAVENOUS | Status: DC | PRN
  Administered 2022-02-10 – 2022-02-11 (×2): 250 mg via INTRAVENOUS

## 2022-02-11 MED ORDER — PROMETHAZINE HCL 12.5 MG PO TABS
12.5000 mg | ORAL_TABLET | ORAL | Status: DC | PRN
  Filled 2022-02-11: qty 2

## 2022-02-11 MED ORDER — LABETALOL HCL 5 MG/ML IV SOLN
10.0000 mg | INTRAVENOUS | Status: DC | PRN
  Administered 2022-02-13 (×3): 20 mg via INTRAVENOUS
  Administered 2022-02-13: 40 mg via INTRAVENOUS
  Administered 2022-02-13: 20 mg via INTRAVENOUS
  Administered 2022-02-14: 10 mg via INTRAVENOUS
  Administered 2022-02-14: 20 mg via INTRAVENOUS
  Administered 2022-02-16: 10 mg via INTRAVENOUS
  Filled 2022-02-11 (×3): qty 4
  Filled 2022-02-11: qty 8
  Filled 2022-02-11 (×4): qty 4
  Filled 2022-02-11: qty 8

## 2022-02-11 MED ORDER — THROMBIN 5000 UNITS EX SOLR
CUTANEOUS | Status: AC
  Filled 2022-02-11: qty 10000

## 2022-02-11 MED ORDER — SODIUM CHLORIDE 0.9 % IV BOLUS
500.0000 mL | Freq: Once | INTRAVENOUS | Status: AC
  Administered 2022-02-11: 500 mL via INTRAVENOUS

## 2022-02-11 MED ORDER — SENNOSIDES 8.8 MG/5ML PO SYRP: 8.8000 mg | ORAL_SOLUTION | Freq: Two times a day (BID) | ORAL | Status: DC

## 2022-02-11 MED ORDER — DOCUSATE SODIUM 50 MG/5ML PO LIQD
100.0000 mg | Freq: Two times a day (BID) | ORAL | Status: DC
  Administered 2022-02-11 – 2022-02-13 (×5): 100 mg
  Filled 2022-02-11 (×6): qty 10

## 2022-02-11 MED ORDER — ALBUMIN HUMAN 5 % IV SOLN: INTRAVENOUS | Status: DC | PRN

## 2022-02-11 MED ORDER — ROCURONIUM BROMIDE 10 MG/ML (PF) SYRINGE
PREFILLED_SYRINGE | INTRAVENOUS | Status: AC
  Filled 2022-02-11: qty 10

## 2022-02-11 NOTE — Progress Notes (Signed)
Patient ID: Vernon [name], male   DOB: 07-Apr-1946, 77 y.o.   MRN: [id number] ?Vital signs are stable but on pressors ?Remains on the ventilator ?Earlier this morning per nursing assessment with fentanyl sedation being discontinued briefly the patient would open eyes and follow commands moving all 4 extremities although his left side was weaker.  His Hemovac drain seems to be putting out considerable amount at this time and this will remain in place. ? ?The CT scan obtained at 9 AM today demonstrates that the cerebellar hemorrhage is smaller and there has been clearly a decompression with the ambient cisterns being open and no evidence of hydrocephalus.  Fourth ventricle is also visualized on today's scan.  In that regard the scan looks better.  The amount of blood that is really accumulated though is a bit bothersome.  We will watch this closely.  No hydrocephalus is noted on the scan. ? ?I discussed the situation with the patient's daughter and son who are present in the room this morning. ?

## 2022-02-11 NOTE — Op Note (Signed)
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  Placement of right parieto-occipital ventriculostomy. ?Surgeon: Kristeen Miss ?Anesthesia: General endotracheal ?Indications: Vernon [name] is a 76 year old individual who has had significant hemorrhage in the cerebellum.  This had developed during the day with some initial mild left-sided discoordination and a small hemorrhage but this increased in size about fourfold during the daytime.  A CTA demonstrated no obvious vascular abnormality.  Patient required ventilatory assistance and was intubated.  He is taken to the operating room emergently as he was deteriorating neurologically. ? ?Procedure the patient was brought to the operating room supine on the stretcher already intubated a central line and arterial line was placed.  Patient was then placed in the 3 pin headrest and carefully turned to the prone position with the neck flexed he was secured in that position.  The shoulders were taped inferiorly and the back of the neck was shaved up to the occipital region.  The skin was cleansed with alcohol and DuraPrep and draped in a sterile fashion.  Then approximately 3 cm lateral to the midline on the right and 7 cm up from the inion a vertical incision was created and the galea was separated from the periosteum.  A bur hole was created in this region.  Then with a singular pass of a ventriculostomy catheter CSF was reached at 7 cm under some moderate pressure the fluid initially drained briskly but then became hemorrhagic.  The catheter then clotted off.  This was after the initial closure of the wound with 2-0 Vicryl and surgical staples.  The catheter was then removed it was not then reinserted.  In the meantime we started the craniectomy with a midline incision which was dissected down to the galea  into the suboccipital region splitting the capitis muscles.  The muscles were dissected from the suboccipital region and a self-retaining cerebellar retractor was placed into this area.  Then using a high-speed bur and the acorn bit of bone was shaved from the midportion of the suboccipital area until the dura was identified.  Using a 2 3 and a 4 mm Kerrison punch and a duckbill Leksell rongeur then created a craniectomy in the suboccipital area.  The craniectomy was brought all the way to the edge of the transverse sinus.  Then the dura was opened in a horseshoe fashion basing it on the transverse sinus.  The underlying cerebellum was noted to be somewhat edematous and under modest amount of swelling.  A corticectomy was then performed into the cerebellar tissue and in the depths of approximately 5 cm hemorrhagic cavity was entered.  The hematoma was evacuated from this region there was a significant brisk bleeding in this area and this required further removal of tissue and several efforts at tamponade during this time approximately 750 cc of blood loss was encountered until good hemostasis could be achieved.  No abnormal tissue was removed but there was some suspicion that this may have represented a small AVM.  Further dissection yielded other pockets of the hematoma and these were evacuated using the bipolar cautery and saline irrigation with mild suction.  In the end hemostasis in the cavity was obtained meticulously.  Once this was established retractors were removed and the dura was closed in original position.  The muscular tissue was closed with several interrupted 2-0 Vicryl sutures the cervical dorsal fascia was then closed with 2-0 Vicryl sutures  in interrupted fashion surgical staples were placed into the galea and the scalp.  A dry sterile dressing was applied and the patient was then turned into the supine position and removed from the 3 pin headrest.  He was then returned to the ICU. ?

## 2022-02-11 NOTE — Transfer of Care (Signed)
Immediate Anesthesia Transfer of Care Note ? ?Patient: Vernon [name] ? ?Procedure(s) Performed: SUBOCCIPITAL CRANIECTOMY FOR EVACUATION OF CEREBELLAR HEMATOMA (Head) ? ?Patient Location: PACU and ICU ? ?Anesthesia Type:General ? ?Level of Consciousness: sedated and Patient remains intubated per anesthesia plan ? ?Airway & Oxygen Therapy: Patient remains intubated per anesthesia plan and Patient placed on Ventilator (see vital sign flow sheet for setting) ? ?Post-op Assessment: Report given to RN and Post -op Vital signs reviewed and stable ? ?Post vital signs: Reviewed and stable ? ?Last Vitals:  ?Vitals Value Taken Time  ?BP 101/62 02/11/22 0136  ?Temp    ?Pulse 55 02/11/22 0136  ?Resp 16 02/11/22 0136  ?SpO2 94 % 02/11/22 0136  ? ? ?Last Pain: There were no vitals filed for this visit.   ? ?  ? ?Complications: No notable events documented. ?

## 2022-02-11 NOTE — Anesthesia Postprocedure Evaluation (Signed)
Anesthesia Post Note ? ?Patient: CALDWELL KRONENBERGER ? ?Procedure(s) Performed: SUBOCCIPITAL CRANIECTOMY FOR EVACUATION OF CEREBELLAR HEMATOMA (Head) ? ?  ? ?Patient location during evaluation: SICU ?Anesthesia Type: General ?Level of consciousness: sedated ?Pain management: pain level controlled ?Vital Signs Assessment: post-procedure vital signs reviewed and stable ?Respiratory status: patient remains intubated per anesthesia plan ?Cardiovascular status: stable ?Postop Assessment: no apparent nausea or vomiting ?Anesthetic complications: no ? ? ?No notable events documented. ? ?Last Vitals:  ?Vitals:  ? 02/11/22 2000 02/11/22 2030  ?BP: 120/63 98/62  ?Pulse: 65 (!) 53  ?Resp: 16 16  ?Temp: 37.2 ?C 37.2 ?C  ?SpO2: 95% 94%  ?  ?Last Pain: There were no vitals filed for this visit. ? ?  ?  ?  ?  ?  ?  ? ?Santa Lighter ? ? ? ? ?

## 2022-02-11 NOTE — Progress Notes (Addendum)
STROKE TEAM PROGRESS NOTE   INTERVAL HISTORY Patient is seen in his room with multiple family members at the bedside.  Yesterday, he presented to [name] with vomiting and incoordiantion on the left side.  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  His Eliquis was reversed with [name] and he was transferred here.  His neurological status began to worsen and he was intubated.  Repeat CT demonstrated enlarging ICH.  Neurosurgery was consulted and decompressive suboccipital craniectomy was performed.  3% HTS was started for cerebral edema.. 23.5% HTS bolus will be given as Na is still at 145 despite 3% HTS.  Vitals:   02/11/22 0905 02/11/22 0933 02/11/22 1000 02/11/22 1146  BP:    (!) 96/58  Pulse:   (!) 47 (!) 47  Resp:   14 16  Temp:   98.8 F (37.1 C) 99 F (37.2 C)  SpO2: 100% 100%  100%  Height:       CBC:  Recent Labs  Lab 02/10/22 1553 02/10/22 1956 02/11/22 0309 02/11/22 0556  WBC 8.2  --   --  9.6  NEUTROABS 7.4  --   --   --   HGB 8.9*   < > 9.5* 9.3*  HCT 28.4*   < > 28.0* 28.7*  MCV 95.6  --   --  94.1  PLT 163  --   --  270   < > = values in this interval not displayed.   Basic Metabolic Panel:  Recent Labs  Lab 02/10/22 1553 02/10/22 1956 02/11/22 0309 02/11/22 0423 02/11/22 0556 02/11/22 1057 02/11/22 1217  NA 137   < > 144   < > 142 145  --   K 4.7   < > 5.5*  --  5.5*  --   --   CL 110  --   --   --  117*  --   --   CO2 23  --   --   --  18*  --   --   GLUCOSE 171*  --   --   --  146*  --   --   BUN 33*  --   --   --  34*  --   --   CREATININE 1.24  --   --   --  1.62*  --   --   CALCIUM 9.1  --   --   --  8.4*  --   --   MG  --   --   --   --   --   --  1.6*  PHOS  --   --   --   --   --   --  4.9*   < > = values in this interval not displayed.   Lipid Panel:  Recent Labs  Lab 02/11/22 0423  CHOL 68  TRIG 79  77  HDL 27*  CHOLHDL 2.5  VLDL 16  LDLCALC 25   HgbA1c:  Recent Labs  Lab 02/11/22 0423  HGBA1C 6.2*    Urine Drug Screen: No results for input(s): LABOPIA, COCAINSCRNUR, LABBENZ, AMPHETMU, THCU, LABBARB in the last 168 hours.  Alcohol Level No results for input(s): ETH in the last 168 hours.  IMAGING past 24 hours CT HEAD WO CONTRAST  Result Date: 02/11/2022 CLINICAL DATA:  76 year old male postoperative day 1 status post suboccipital craniotomy and hematoma resection following acute left cerebellar hemorrhage. History of leukemia. EXAM: CT HEAD WITHOUT CONTRAST TECHNIQUE: Contiguous  axial images were obtained from the base of the skull through the vertex without intravenous contrast. RADIATION DOSE REDUCTION: This exam was performed according to the departmental dose-optimization program which includes automated exposure control, adjustment of the mA and/or kV according to patient size and/or use of iterative reconstruction technique. COMPARISON:  Preoperative CT 02/10/2022 at 1930 hours. FINDINGS: Brain: Dr. Barnett Abu advises that a right posterior approach supratentorial external ventricular drain was placed in the OR but later removed. And this is felt to explain a small volume of hemorrhage in heterogeneity in the right parieto-occipital region tracking toward the atrium of the right lateral ventricle. There is trace additional supratentorial subarachnoid hemorrhage. No intraventricular hemorrhage. No supratentorial midline shift. Elsewhere stable supratentorial gray-white matter differentiation. Postoperative changes to the left posterior fossa with debulking of left cerebellar hematoma. Residual cerebellar edema and mass effect but improved patency of the 4th ventricle now visible on series 3, image 24. Small volume of 4th ventricular hemorrhage. Other basilar cisterns appear stable to mildly improved. And lateral and 3rd ventricle size and configuration also appears improved since last night, slightly smaller. No acute cortically based infarct identified. Vascular: Calcified atherosclerosis at  the skull base. No suspicious intracranial vascular hyperdensity. Skull: Left suboccipital craniectomy. Also there is a small right parieto-occipital burr hole on series 4, image 38. Otherwise stable and intact. Sinuses/Orbits: Visualized paranasal sinuses and mastoids are stable and well aerated. Other: Postoperative changes to the posterior scalp soft tissues with skin staples in place. Visualized orbit soft tissues are within normal limits. IMPRESSION: 1. Status post left suboccipital craniectomy and cerebellar hematoma evacuation with a moderate residual volume of posterior fossa blood, but improved patency of the 4th ventricle, and decreased lateral and 3rd ventricle size since last night. 2. Sequelae of temporary right parieto-occipital EVD placement, with a small volume of blood along the tract and trace bilateral supratentorial SAH. 3. Otherwise stable noncontrast CT appearance of the brain. 4. Study discussed by telephone with Dr. Barnett Abu on 02/11/2022 at 1002 hours. Electronically Signed   By: Odessa Fleming M.D.   On: 02/11/2022 10:26   CT HEAD WO CONTRAST ( )  Result Date: 02/10/2022 CLINICAL DATA:  Intracranial hemorrhage EXAM: CT HEAD WITHOUT CONTRAST TECHNIQUE: Contiguous axial images were obtained from the base of the skull through the vertex without intravenous contrast. RADIATION DOSE REDUCTION: This exam was performed according to the departmental dose-optimization program which includes automated exposure control, adjustment of the mA and/or kV according to patient size and/or use of iterative reconstruction technique. COMPARISON:  02/10/2022 FINDINGS: Brain: 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 hematoma in the left cerebellum has enlarged and now measures 3.6 x 1.8 cm, previously 2.1 x 2.2 cm. The fourth ventricle is effaced. There is some increased subdural blood along the posterior falx cerebri. The basal cisterns are effaced. There is periventricular hypoattenuation compatible with chronic microvascular  disease. Vascular: Negative Skull: Normal. Negative for fracture or focal lesion. Sinuses/Orbits: No acute finding. Other: None. IMPRESSION: 1. Increased size of intraparenchymal hematoma in the left cerebellum, with effaced fourth ventricle and basal cisterns. 2. No hydrocephalus, but risk of developing obstructive hydrocephalus is high. 3. Increased subdural blood along the posterior falx cerebri. These results were communicated to Dr. Erick Blinks at 7:45 pm on 02/10/2022 by text page via the [facility] messaging system. Electronically Signed   By: Deatra Robinson M.D.   On: 02/10/2022 19:46   DG CHEST PORT 1 VIEW  Result Date: 02/11/2022 CLINICAL DATA:  Respiratory failure EXAM: PORTABLE CHEST 1 VIEW  COMPARISON:  02/10/2022 FINDINGS: Endotracheal tube seen 2.3 cm above the carina. Nasogastric tube extends into the upper abdomen beyond the margin of the examination. Right internal jugular central venous catheter tip noted at the superior cavoatrial junction. Lung volumes are small, but are symmetric. Bibasilar pulmonary infiltrate has improved, likely reflecting improving pulmonary edema given its relatively rapid improvement. Persistent air bronchograms noted within the retrocardiac region in keeping with focal consolidation within this region. Cardiac size mildly enlarged, unchanged. No acute bone abnormality. IMPRESSION: Support lines and tubes in appropriate position. Stable pulmonary hypoinflation. Improving bibasilar pulmonary infiltrate likely reflecting improving pulmonary edema. Persistent consolidation within the retrocardiac region. Electronically Signed   By: Helyn Numbers M.D.   On: 02/11/2022 03:20   DG CHEST PORT 1 VIEW  Result Date: 02/10/2022 CLINICAL DATA:  Respiratory failure EXAM: PORTABLE CHEST 1 VIEW COMPARISON:  None. FINDINGS: Endotracheal tube is in place 9 mm above the carina. Retaining cuff mildly distends the trachea. Nasogastric tube extends into the upper abdomen beyond the  margin of the examination. Bibasilar pulmonary infiltrates are present, infection versus edema. No pneumothorax or pleural effusion. Cardiac size is at the upper limits of normal. No acute bone abnormality. IMPRESSION: Endotracheal tube 9 mm above the carina. Withdrawal by 1-2 cm may more optimally position the catheter. Endotracheal tube retaining cuff appears to distend the trachea and revision may be warranted. Bibasilar pulmonary infiltrate, infection versus edema. Electronically Signed   By: Helyn Numbers M.D.   On: 02/10/2022 20:21   ECHOCARDIOGRAM COMPLETE  Result Date: 02/11/2022    ECHOCARDIOGRAM REPORT   Patient Name:   Vernon [name] Date of Exam: 02/11/2022 Medical Rec #:  [id number]     Height:       69.0 in Accession #:    [id number]    Weight:       215.0 lb Date of Birth:  December 04, 1945     BSA:          2.130 m Patient Age:    75 years      BP:           90/54 mmHg Patient Gender: M             HR:           44 bpm. Exam Location:  Inpatient Procedure: 2D Echo, Cardiac Doppler and Color Doppler Indications:    Stroke  History:        Patient has no prior history of Echocardiogram examinations.                 Arrythmias:Atrial Fibrillation; Risk Factors:Diabetes and                 Dyslipidemia.  Sonographer:    Ross Ludwig RDCS (AE) Referring Phys: [id number] [facility]  Sonographer Comments: Echo performed with patient supine and on artificial respirator. IMPRESSIONS  1. Left ventricular ejection fraction, by estimation, is 55 to 60%. The left ventricle has normal function. The left ventricle has no regional wall motion abnormalities. There is moderate left ventricular hypertrophy. Left ventricular diastolic parameters are indeterminate.  2. Right ventricular systolic function is normal. The right ventricular size is normal. There is moderately elevated pulmonary artery systolic pressure.  3. Left atrial size was severely dilated.  4. Right atrial size was mild to moderately dilated.  5. A small  pericardial effusion is present. There is no evidence of cardiac tamponade.  6. Mild mitral valve regurgitation.  7. There is moderate calcification  of the aortic valve. Aortic valve regurgitation is not visualized. Mild aortic valve stenosis. Aortic valve area, by VTI measures 1.03 cm. Aortic valve mean gradient measures 19.0 mmHg.  8. The inferior vena cava is dilated in size with <50% respiratory variability, suggesting right atrial pressure of 15 mmHg. FINDINGS  Left Ventricle: Left ventricular ejection fraction, by estimation, is 55 to 60%. The left ventricle has normal function. The left ventricle has no regional wall motion abnormalities. The left ventricular internal cavity size was normal in size. There is  moderate left ventricular hypertrophy. Left ventricular diastolic parameters are indeterminate. Right Ventricle: The right ventricular size is normal. No increase in right ventricular wall thickness. Right ventricular systolic function is normal. There is moderately elevated pulmonary artery systolic pressure. The tricuspid regurgitant velocity is 3.06 m/s, and with an assumed right atrial pressure of 15 mmHg, the estimated right ventricular systolic pressure is 52.5 mmHg. Left Atrium: Left atrial size was severely dilated. Right Atrium: Right atrial size was mild to moderately dilated. Pericardium: A small pericardial effusion is present. There is no evidence of cardiac tamponade. Mitral Valve: Mild mitral annular calcification. Mild mitral valve regurgitation. Tricuspid Valve: Tricuspid valve regurgitation is mild. Aortic Valve: There is moderate calcification of the aortic valve. Aortic valve regurgitation is not visualized. Mild aortic stenosis is present. Aortic valve mean gradient measures 19.0 mmHg. Aortic valve peak gradient measures 41.0 mmHg. Aortic valve area, by VTI measures 1.03 cm. Pulmonic Valve: Pulmonic valve regurgitation is mild. Aorta: The aortic root is normal in size and  structure. Venous: The inferior vena cava is dilated in size with less than 50% respiratory variability, suggesting right atrial pressure of 15 mmHg. IAS/Shunts: The interatrial septum was not well visualized.  LEFT VENTRICLE PLAX 2D LVIDd:         5.20 cm LVIDs:         3.50 cm LV PW:         1.40 cm LV IVS:        1.40 cm LVOT diam:     1.90 cm LV SV:         65 LV SV Index:   30 LVOT Area:     2.84 cm  RIGHT VENTRICLE             IVC RV Basal diam:  3.40 cm     IVC diam: 2.60 cm RV S prime:     11.20 cm/s TAPSE (M-mode): 1.1 cm LEFT ATRIUM              Index        RIGHT ATRIUM           Index LA diam:        4.60 cm  2.16 cm/m   RA Area:     24.30 cm LA Vol (A2C):   93.8 ml  44.03 ml/m  RA Volume:   79.80 ml  37.46 ml/m LA Vol (A4C):   133.0 ml 62.43 ml/m LA Biplane Vol: 124.0 ml 58.20 ml/m  AORTIC VALVE AV Area (Vmax):    1.12 cm AV Area (Vmean):   1.21 cm AV Area (VTI):     1.03 cm AV Vmax:           320.00 cm/s AV Vmean:          197.000 cm/s AV VTI:            0.628 m AV Peak Grad:      41.0 mmHg AV Mean  Grad:      19.0 mmHg LVOT Vmax:         126.00 cm/s LVOT Vmean:        84.300 cm/s LVOT VTI:          0.228 m LVOT/AV VTI ratio: 0.36  AORTA Ao Root diam: 3.50 cm Ao Asc diam:  3.60 cm TRICUSPID VALVE TR Peak grad:   37.5 mmHg TR Vmax:        306.00 cm/s  SHUNTS Systemic VTI:  0.23 m Systemic Diam: 1.90 cm Carolan Clines Electronically signed by Carolan Clines Signature Date/Time: 02/11/2022/1:07:19 PM    Final    CT HEAD CODE STROKE WO CONTRAST  Result Date: 02/10/2022 CLINICAL DATA:  Code stroke.  Neuro deficit, acute, stroke suspected EXAM: CT HEAD WITHOUT CONTRAST TECHNIQUE: Contiguous axial images were obtained from the base of the skull through the vertex without intravenous contrast. RADIATION DOSE REDUCTION: This exam was performed according to the departmental dose-optimization program which includes automated exposure control, adjustment of the mA and/or kV according to patient size and/or  use of iterative reconstruction technique. COMPARISON:  CT of the head October 07, 2019. FINDINGS: Brain: Approximately 2.1 x 2.2 cm area of acute intraparenchymal hemorrhage in the left middle cerebellar peduncle with probable adjacent extra-axial extension. Surrounding edema with mild effacement the fourth ventricle. No hydrocephalus. Mild patchy white matter hypoattenuation supratentorially, nonspecific but probably related to chronic microvascular ischemic disease. No midline shift. Vascular: No hyperdense vessel identified. Calcific intracranial atherosclerosis. Skull: No acute fracture. Sinuses/Orbits: Clear visualized sinuses.  No acute orbital findings Other: No mastoid effusions. IMPRESSION: Acute 2.2 cm intraparenchymal hemorrhage in the left middle cerebellar peduncle with probable small volume adjacent extra-axial extension. Surrounding edema with mild effacement of the fourth ventricle. No hydrocephalus. While nonspecific, consider follow-up MRI with contrast after resolution of hemorrhage to exclude underlying lesion. Findings discussed with Dr. Selina Cooley via telephone at 4:10 p.m. Electronically Signed   By: Feliberto Harts M.D.   On: 02/10/2022 16:17   CT ANGIO HEAD NECK W WO CM (CODE STROKE)  Result Date: 02/10/2022 CLINICAL DATA:  Neuro deficit, acute, stroke suspected r/o vascular malformation - ICH EXAM: CT ANGIOGRAPHY HEAD AND NECK TECHNIQUE: Multidetector CT imaging of the head and neck was performed using the standard protocol during bolus administration of intravenous contrast. Multiplanar CT image reconstructions and MIPs were obtained to evaluate the vascular anatomy. Carotid stenosis measurements (when applicable) are obtained utilizing NASCET criteria, using the distal internal carotid diameter as the denominator. RADIATION DOSE REDUCTION: This exam was performed according to the departmental dose-optimization program which includes automated exposure control, adjustment of the mA  and/or kV according to patient size and/or use of iterative reconstruction technique. CONTRAST:  75mL OMNIPAQUE IOHEXOL 350 MG/ML SOLN COMPARISON:  None. FINDINGS: CTA NECK FINDINGS Aortic arch: Atherosclerosis. Great vessel origins are patent. Mild-to-moderate narrowing of the right subclavian artery origin. Right carotid system: No evidence of dissection, stenosis (50% or greater) or occlusion. Retropharyngeal course of the ICA. Mild predominately calcific atherosclerosis involving the carotid bifurcation proximal ICA. Left carotid system: No evidence of dissection, stenosis (50% or greater) or occlusion. Mild calcific atherosclerosis at the carotid bifurcation involving the proximal ICA. Tortuous ICA. Vertebral arteries: Moderate stenosis of the right vertebral artery origin due to atherosclerosis. Otherwise, vertebral arteries are patent without evidence of significant (greater than 50%) stenosis. Left dominant. Skeleton: Severe multilevel degenerative change in the cervical spine. 1. Other neck: No acute findings. Upper chest: Moderate layering right pleural effusion.  Motion limited evaluation without definite consolidation the visualized lung apices. Review of the MIP images confirms the above findings CTA HEAD FINDINGS Anterior circulation: Mild intracranial ICA atherosclerosis without significant stenosis. Bilateral MCAs and ACAs are patent without proximal hemodynamically significant stenosis. No aneurysm identified. Posterior circulation: Left dominant intradural vertebral artery. Bilateral intradural vertebral arteries, basilar artery, and posterior cerebral arteries are patent without proximal hemodynamically significant. No clear vascular malformation or aneurysm in the region of acute hemorrhage; however, acute blood products limit assessment. Venous sinuses: As permitted by contrast timing, patent. Review of the MIP images confirms the above findings IMPRESSION: CTA head: 1. No large vessel occlusion  or proximal hemodynamically significant stenosis. 2. No evidence of a vascular malformation or aneurysm in the region of acute hemorrhage; however, acute blood products limits assessment. A follow-up study after resolution of the hemorrhage could further assess if clinically warranted. CTA neck: 1. Moderate nondominant right vertebral artery origin stenosis. 2. Otherwise, mild atherosclerosis without greater than 50% stenosis. 3. Moderate layering right pleural effusion. Recommend dedicated chest imaging. Electronically Signed   By: Feliberto Harts M.D.   On: 02/10/2022 16:37    PHYSICAL EXAM General:  Intubated, well-developed patient in no acute distress with hemovac drain in suboccipital regio draining sanguineous drainage Respiratory: Respirations synchronous with ventilator Neurological  On exam, son and daughter are at the bedside. Pt still intubated on sedation, eyes closed, not following commands. With forced eye opening, eyes in mid position, not blinking to visual threat, spontaneous eye rolling movement, not tracking, PERRL. Corneal reflex present, gag and cough present. Breathing  over the vent.  Facial symmetry not able to test due to ET tube.  Tongue protrusion not cooperative. On pain stimulation, no movement in all extremities. However, pt RN, pt following commands on the extremities when sedation on pause. No babinski. Sensation, coordination and gait not tested.  ASSESSMENT/PLAN Vernon [name] is a 76 y.o. male with history of anemia, arthritis, GERD, DM2, HTN and a-fib on Eliquis presenting with vomiting and incoordiantion on the left side.  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  His Eliquis was reversed with [name] and he was transferred here.  His neurological status began to worsen and he was intubated.  Repeat CT demonstrated enlarging ICH.  Neurosurgery was consulted and decompressive suboccipital craniectomy was performed.  3% HTS was started for cerebral edema.. 23.5%  HTS bolus will be given as Na is still at 145 despite 3% HTS.  ICH:  left cerebellar ICH s/p suboccipital decompression and hematoma evacuation likely due to Eliquis use and hypertension Code Stroke CT head acute 2.2 cm IPH in middle cerebellar peduncle with surrounding edema CTA head & neck no LVO or hemodynamically significant stenosis, no evidence of AVM of aneurysm, moderate right vertebral artery stenosis Follow up CT 3/18 increased size of left cerebellar IPH with effaced fourth ventricle Follow up CT 3/19 s/p left suboccipital craniectomy and cerebellar hematoma evacuation with moderate volume of residual posterior fossa blood MRI  pending 2D Echo EF 55-60% LDL 25 HgbA1c 6.2 VTE prophylaxis - SCDs Eliquis (apixaban) daily prior to admission, now on No antithrombotic secondary to IPH Therapy recommendations:  pending Disposition:  pending  Risk for obstructive hydrocephalus CT 3/18 demonstrated effacement of fourth ventricle with risk for obstructive hydrocephalus Suboccipital craniectomy performed by neurosurgery Parieto-occipital hemovac drain placed  Cerebral Edema Edema seen surrounding left cerebellar ICH on CT scan with risk for hydrocephalus 3% HTS at 75 mL/hr Na goal 150-155  23.5% bolus given as Na remains at 145 Monitor Na every 6 hours  History of Hypertension Now hypotensive Home meds:  lisinopril 40 mg daily Unstable Keep SBP 105-160, MAP 75 Currently hypotensive, requiring norepinephrine Long-term BP goal normotensive  Hyperlipidemia Home meds:  simvastatin 80 mg daily LDL 25, goal < 70 Hold off statin due to low LDL  Diabetes type II Controlled Home meds:  metformin 500 mg BID HgbA1c 6.2, goal < 7.0 CBGs  SSI  Respiratory failure Patient intubated due to inability to protect airway CCM on board Ventilator management per CCM  Afib  On eliquis PTA Now bradycardia  No antithrombotics for now given ICH  AKI Cre 1.24-1.62-1.73 Hyperkalemia K  6.1-5.7-5.6, status post Lokelma Likely due to hypotension Taper off propofol as able Continue Levophed, keep MAP more than 75  Other Stroke Risk Factors Advanced Age >/= 106  Former Cigarette smoker Obesity, Body mass index is 31.75 kg/m., BMI >/= 30 associated with increased stroke risk, recommend weight loss, diet and exercise as appropriate   Other Active Problems Hyperkalemia, K 6.1-5.7-5.6, status post [place] day # 1  This patient is critically ill due to cerebral ICH, hydrocephalus, status post suboccipital decompression and hematoma evacuation, hypotension, AKI, cerebral edema, A-fib and at significant risk of neurological worsening, death form brain herniation, obstructive hydrocephalus, renal failure, heart failure, shock. This patient's care requires constant monitoring of vital signs, hemodynamics, respiratory and cardiac monitoring, review of multiple databases, neurological assessment, discussion with family, other specialists and medical decision making of high complexity. I spent 40 minutes of neurocritical care time in the care of this patient.  Marvel Plan, MD PhD Stroke Neurology 02/11/2022 3:04 PM      To contact Stroke Continuity provider, please refer to [email or web address]. After hours, contact General Neurology

## 2022-02-11 NOTE — Progress Notes (Signed)
Transported pt to CT Scan and back on full support.  Tolerated well. ?

## 2022-02-11 NOTE — Progress Notes (Signed)
? ?NAME:  Vernon [name], MRN:  [id number], DOB:  1945-12-11, LOS: 1 ?ADMISSION DATE:  02/10/2022, CONSULTATION DATE:  3/18 ?REFERRING MD:  Sarita Haver, CHIEF COMPLAINT:  headache, weakness, nausea  ? ?History of Present Illness:  ?76 y/o male went to [facility] on 3/18 with weakness and nausea, found to have a cerebellar hemorrhage.  Repeat head CT showed expansion. He vomited and developed hypoxemia requiring intubation for airway protection.  He went for an emergent craniotomy on 3/18, hematoma evacuation and drain placement. PCCM consulted for ventilator management.  ? ?Pertinent  Medical History  ?Hypertension ?Anemia ?GERD ?DM2 ?Hiatal hernia ?Atrial fibrillation on eliquis ?Aortic stenosis ?Hypertension ?myelodysplasia ? ?Significant Hospital Events: ?Including procedures, antibiotic start and stop dates in addition to other pertinent events   ?3/18 admission, intubation for airway protection after vomiting, underwent Suboccipital craniectomy and evacuation of cerebellar hemorrhage.  Placement of right parieto-occipital ventriculostomy by Dr. Ellene Route ?3/19 repeat CT head > s/p left suboccipital craniectomy and cerebellar hematoma evacuation with moderate residual volume of posterior fossa blood, improved patency of 4th ventricle, decreased lateral and 3rd ventricle size; EVD in place, otherwise stable CT ? ?Interim History / Subjective:  ? ?Remains mechanically ventilated ?CT head showed moderate residual blood ? ?Objective   ?Blood pressure (!) 90/54, pulse (!) 47, temperature 98.8 ?F (37.1 ?C), resp. rate 14, height '5\' 9"'$  (1.753 m), SpO2 100 %. ?   ?Vent Mode: PRVC ?FiO2 (%):  [60 %-100 %] 80 % ?Set Rate:  [16 bmp] 16 bmp ?Vt Set:  [500 mL-560 mL] 560 mL ?PEEP:  [5 cmH20-7 cmH20] 7 cmH20 ?Plateau Pressure:  [13 CZY60-63 cmH20] 17 cmH20  ? ?Intake/Output Summary (Last 24 hours) at 02/11/2022 1139 ?Last data filed at 02/11/2022 1000 ?Gross per 24 hour  ?Intake 2630.84 ml  ?Output 1110 ml  ?Net 1520.84 ml  ? ?There were no  vitals filed for this visit. ? ?Examination: ? ?General:  In bed on vent ?HENT: suboccipital drain in place, dressing in place, no drainage or redness, ETT in place ?PULM: CTA B, vent supported breathing ?CV: RRR, no mgr ?GI: BS+, soft, nontender ?MSK: normal bulk and tone ?Neuro: sedated on vent ? ?3/19 CXR > improving bibasilar infiltrates ? ?Resolved Hospital Problem list   ? ? ?Assessment & Plan:  ?Acute respiratory failure with hypoxemia due inability to protect airway  ?Concern for aspiration of vomit, but oxygnation and X-ray improving ?Monitor off antibiotics ?Wean down PEEP/FiO2 for Goal SaO2 > 88% ?Full mechanical vent support for today, plan weaning attempt on 3/19 ?VAP prevention ?Daily WUA/SBT ? ?Spontaneous Cerebellar hematoma, s/p evacuation on 3/18 ?At risk for ongoing oozing today ?Continue to monitor neuro exam, hold off on intubation 3/19 and consider extubation if CT stable on 3/20 ?Secondary stroke prevention per neurology/stroke service/nsgy ?BP goals and glucose goals per stroke service ?Statin, antiplatelet agent per stroke service ?Imaging per stroke service ?PT/OT/SLP ?Drain management per neurosurgery ?Hold eliquis ? 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 ?DM2 ?Add SSI ? ?Myelodysplasia, received 1 U PRBC in OR 3/18 ?Will need onc follow up after hospital visit ?Monitor for bleeding ?Transfuse PRBC for Hgb < 7 gm/dL ? ?AKI> new 3/19, oliguric ?Hold lisinopril ?Repeat BMET ?Monitor BMET and UOP ?Replace electrolytes as needed ?Bolus saline 500cc IV now ?Repeat BMET later today ? ? ?Best Practice (right click and "Reselect all SmartList Selections" daily)  ? ?Diet/type: tubefeeds ?DVT prophylaxis: SCD ?GI prophylaxis: N/A ?  Lines: Central line and yes and it is still needed ?Foley:  Yes, and it is still needed ?Code Status:  full code ?Last date of multidisciplinary goals of  care discussion [per primary service] ? ?Labs   ?CBC: ?Recent Labs  ?Lab 02/10/22 ?1553 02/10/22 ?1956 02/10/22 ?2357 02/11/22 ?0044 02/11/22 ?0157 02/11/22 ?0309 02/11/22 ?7353  ?WBC 8.2  --   --   --   --   --  9.6  ?NEUTROABS 7.4  --   --   --   --   --   --   ?HGB 8.9*   < > 10.5* 10.2* 9.5* 9.5* 9.3*  ?HCT 28.4*   < > 31.0* 30.0* 28.0* 28.0* 28.7*  ?MCV 95.6  --   --   --   --   --  94.1  ?PLT 163  --   --   --   --   --  270  ? < > = values in this interval not displayed.  ? ? ?Basic Metabolic Panel: ?Recent Labs  ?Lab 02/10/22 ?1553 02/10/22 ?1956 02/10/22 ?2357 02/11/22 ?0044 02/11/22 ?0157 02/11/22 ?0309 02/11/22 ?0423 02/11/22 ?2992  ?NA 137   < > 142 142 143 144 144 142  ?K 4.7   < > 6.1* 6.1* 5.7* 5.5*  --  5.5*  ?CL 110  --   --   --   --   --   --  117*  ?CO2 23  --   --   --   --   --   --  18*  ?GLUCOSE 171*  --   --   --   --   --   --  146*  ?BUN 33*  --   --   --   --   --   --  34*  ?CREATININE 1.24  --   --   --   --   --   --  1.62*  ?CALCIUM 9.1  --   --   --   --   --   --  8.4*  ? < > = values in this interval not displayed.  ? ?GFR: ?Estimated Creatinine Clearance: 45.4 mL/min (A) (by C-G formula based on SCr of 1.62 mg/dL (H)). ?Recent Labs  ?Lab 02/10/22 ?1553 02/11/22 ?4268  ?WBC 8.2 9.6  ? ? ?Liver Function Tests: ?Recent Labs  ?Lab 02/10/22 ?1553  ?AST 24  ?ALT 18  ?ALKPHOS 92  ?BILITOT 3.3*  ?PROT 7.2  ?ALBUMIN 3.9  ? ?No results for input(s): LIPASE, AMYLASE in the last 168 hours. ?No results for input(s): AMMONIA in the last 168 hours. ? ?ABG ?   ?Component Value Date/Time  ? PHART 7.274 (L) 02/11/2022 0309  ? PCO2ART 41.5 02/11/2022 0309  ? PO2ART 87 02/11/2022 0309  ? HCO3 19.4 (L) 02/11/2022 0309  ? TCO2 21 (L) 02/11/2022 0309  ? ACIDBASEDEF 7.0 (H) 02/11/2022 0309  ? O2SAT 96 02/11/2022 0309  ?  ? ?Coagulation Profile: ?Recent Labs  ?Lab 02/10/22 ?1553  ?INR 1.5*  ? ? ?Cardiac Enzymes: ?No results for input(s): CKTOTAL, CKMB, CKMBINDEX, TROPONINI in the last 168  hours. ? ?HbA1C: ?Hgb A1c MFr Bld  ?Date/Time Value Ref Range Status  ?02/11/2022 04:23 AM 6.2 (H) 4.8 - 5.6 % Final  ?  Comment:  ?  (NOTE) ?Pre diabetes:          5.7%-6.4% ? ?Diabetes:              >6.4% ? ?  Glycemic control for   <7.0% ?adults with diabetes ?  ? ? ?CBG: ?Recent Labs  ?Lab 02/10/22 ?1548  ?GLUCAP 158*  ? ? ?Critical care time: 45 minutes ?  ? ? ?Roselie Awkward, MD ?[place] [facility] ?Pager: [phone] ?Cell: [phone] ?After 7:00 pm call Elink  [phone] ? ?

## 2022-02-11 NOTE — Progress Notes (Signed)
PT Cancellation Note ? ?Patient Details ?Name: TUKKER BYRNS ?MRN: [id number] ?DOB: 05/29/1946 ? ? ?Cancelled Treatment:    Reason Eval/Treat Not Completed: Medical issues which prohibited therapy as pt remains intubated and sedated at this time. Will continue to follow and evaluate when medically appropriate.  ? ?West Carbo, PT, DPT  ? ?Acute Rehabilitation Department ?Pager #: [phone] ? ? ?Sandra Cockayne ?02/11/2022, 12:30 PM ?

## 2022-02-11 NOTE — Progress Notes (Signed)
eLink Physician-Brief Progress Note ?Patient Name: Vernon [name] ?DOB: Apr 03, 1946 ?MRN: [id number] ? ? ?Date of Service ? 02/11/2022  ?HPI/Events of Note ? Saturation is 89-90 % post-procedure in the OR, radiologist recommended withdrawing ET tube 2 cm, he had modest volume resuscitation perioperatively.  ?eICU Interventions ? ET tube to be withdrawn 1 more cm for a total of 2 cm per recommendation, CXR after ET tube is withdrawn, PEEP increased by 2 cm, ABG in 45 minutes.  ? ? ? ?  ? ?Frederik Pear ?02/11/2022, 1:55 AM ?

## 2022-02-11 NOTE — Progress Notes (Signed)
?  Echocardiogram ?2D Echocardiogram has been performed. ? ?Vernon [name] ?02/11/2022, 11:17 AM ?

## 2022-02-12 ENCOUNTER — Inpatient Hospital Stay (HOSPITAL_COMMUNITY): Payer: Medicare Other

## 2022-02-12 ENCOUNTER — Other Ambulatory Visit: Payer: Self-pay

## 2022-02-12 ENCOUNTER — Inpatient Hospital Stay: Payer: Medicare Other

## 2022-02-12 ENCOUNTER — Encounter (HOSPITAL_COMMUNITY): Payer: Self-pay | Admitting: Neurological Surgery

## 2022-02-12 ENCOUNTER — Telehealth: Payer: Self-pay | Admitting: *Deleted

## 2022-02-12 DIAGNOSIS — J9601 Acute respiratory failure with hypoxia: Secondary | ICD-10-CM

## 2022-02-12 DIAGNOSIS — N170 Acute kidney failure with tubular necrosis: Secondary | ICD-10-CM | POA: Diagnosis not present

## 2022-02-12 DIAGNOSIS — G936 Cerebral edema: Secondary | ICD-10-CM | POA: Diagnosis not present

## 2022-02-12 DIAGNOSIS — I614 Nontraumatic intracerebral hemorrhage in cerebellum: Secondary | ICD-10-CM | POA: Diagnosis not present

## 2022-02-12 DIAGNOSIS — E875 Hyperkalemia: Secondary | ICD-10-CM

## 2022-02-12 DIAGNOSIS — G911 Obstructive hydrocephalus: Secondary | ICD-10-CM | POA: Diagnosis not present

## 2022-02-12 DIAGNOSIS — J9622 Acute and chronic respiratory failure with hypercapnia: Secondary | ICD-10-CM | POA: Diagnosis not present

## 2022-02-12 LAB — BASIC METABOLIC PANEL
BUN: 44 mg/dL — ABNORMAL HIGH (ref 8–23)
CO2: 18 mmol/L — ABNORMAL LOW (ref 22–32)
Calcium: 8.1 mg/dL — ABNORMAL LOW (ref 8.9–10.3)
Chloride: >130 mmol/L (ref 98–111)
Creatinine, Ser: 1.83 mg/dL — ABNORMAL HIGH (ref 0.61–1.24)
GFR, Estimated: 38 mL/min — ABNORMAL LOW (ref >60)
Glucose, Bld: 195 mg/dL — ABNORMAL HIGH (ref 70–99)
Potassium: 5.4 mmol/L — ABNORMAL HIGH (ref 3.5–5.1)
Sodium: 156 mmol/L — ABNORMAL HIGH (ref 135–145)

## 2022-02-12 LAB — CBC WITH DIFFERENTIAL/PLATELET
Abs Immature Granulocytes: 0.06 10*3/uL (ref 0.00–0.07)
Basophils Absolute: 0 10*3/uL (ref 0.0–0.1)
Basophils Relative: 0 %
Eosinophils Absolute: 0 10*3/uL (ref 0.0–0.5)
Eosinophils Relative: 0 %
HCT: 28.9 % — ABNORMAL LOW (ref 39.0–52.0)
Hemoglobin: 9 g/dL — ABNORMAL LOW (ref 13.0–17.0)
Immature Granulocytes: 1 %
Lymphocytes Relative: 11 %
Lymphs Abs: 1.2 10*3/uL (ref 0.7–4.0)
MCH: 30.7 pg (ref 26.0–34.0)
MCHC: 31.1 g/dL (ref 30.0–36.0)
MCV: 98.6 fL (ref 80.0–100.0)
Monocytes Absolute: 0.8 10*3/uL (ref 0.1–1.0)
Monocytes Relative: 8 %
Neutro Abs: 8.5 10*3/uL — ABNORMAL HIGH (ref 1.7–7.7)
Neutrophils Relative %: 80 %
Platelets: 190 10*3/uL (ref 150–400)
RBC: 2.93 MIL/uL — ABNORMAL LOW (ref 4.22–5.81)
RDW: 29.3 % — ABNORMAL HIGH (ref 11.5–15.5)
WBC: 10.6 10*3/uL — ABNORMAL HIGH (ref 4.0–10.5)
nRBC: 0.3 % — ABNORMAL HIGH (ref 0.0–0.2)

## 2022-02-12 LAB — CBC
HCT: 29 % — ABNORMAL LOW (ref 39.0–52.0)
Hemoglobin: 8.7 g/dL — ABNORMAL LOW (ref 13.0–17.0)
MCH: 30 pg (ref 26.0–34.0)
MCHC: 30 g/dL (ref 30.0–36.0)
MCV: 100 fL (ref 80.0–100.0)
Platelets: 207 10*3/uL (ref 150–400)
RBC: 2.9 MIL/uL — ABNORMAL LOW (ref 4.22–5.81)
RDW: 29.3 % — ABNORMAL HIGH (ref 11.5–15.5)
WBC: 11 10*3/uL — ABNORMAL HIGH (ref 4.0–10.5)
nRBC: 0.5 % — ABNORMAL HIGH (ref 0.0–0.2)

## 2022-02-12 LAB — BRAIN NATRIURETIC PEPTIDE: B Natriuretic Peptide: 854.2 pg/mL — ABNORMAL HIGH (ref 0.0–100.0)

## 2022-02-12 LAB — COMPREHENSIVE METABOLIC PANEL
ALT: 19 U/L (ref 0–44)
AST: 21 U/L (ref 15–41)
Albumin: 3.3 g/dL — ABNORMAL LOW (ref 3.5–5.0)
Alkaline Phosphatase: 72 U/L (ref 38–126)
Anion gap: 4 — ABNORMAL LOW (ref 5–15)
BUN: 41 mg/dL — ABNORMAL HIGH (ref 8–23)
CO2: 19 mmol/L — ABNORMAL LOW (ref 22–32)
Calcium: 7.9 mg/dL — ABNORMAL LOW (ref 8.9–10.3)
Chloride: 127 mmol/L — ABNORMAL HIGH (ref 98–111)
Creatinine, Ser: 1.86 mg/dL — ABNORMAL HIGH (ref 0.61–1.24)
GFR, Estimated: 37 mL/min — ABNORMAL LOW (ref >60)
Glucose, Bld: 156 mg/dL — ABNORMAL HIGH (ref 70–99)
Potassium: 5.2 mmol/L — ABNORMAL HIGH (ref 3.5–5.1)
Sodium: 150 mmol/L — ABNORMAL HIGH (ref 135–145)
Total Bilirubin: 3.2 mg/dL — ABNORMAL HIGH (ref 0.3–1.2)
Total Protein: 5.9 g/dL — ABNORMAL LOW (ref 6.5–8.1)

## 2022-02-12 LAB — PHOSPHORUS
Phosphorus: 4.4 mg/dL (ref 2.5–4.6)
Phosphorus: 3.6 mg/dL (ref 2.5–4.6)

## 2022-02-12 LAB — GLUCOSE, CAPILLARY
Glucose-Capillary: 132 mg/dL — ABNORMAL HIGH (ref 70–99)
Glucose-Capillary: 140 mg/dL — ABNORMAL HIGH (ref 70–99)
Glucose-Capillary: 158 mg/dL — ABNORMAL HIGH (ref 70–99)
Glucose-Capillary: 159 mg/dL — ABNORMAL HIGH (ref 70–99)
Glucose-Capillary: 163 mg/dL — ABNORMAL HIGH (ref 70–99)
Glucose-Capillary: 171 mg/dL — ABNORMAL HIGH (ref 70–99)

## 2022-02-12 LAB — MAGNESIUM
Magnesium: 2.5 mg/dL — ABNORMAL HIGH (ref 1.7–2.4)
Magnesium: 2.4 mg/dL (ref 1.7–2.4)

## 2022-02-12 LAB — SODIUM
Sodium: 157 mmol/L — ABNORMAL HIGH (ref 135–145)
Sodium: 158 mmol/L — ABNORMAL HIGH (ref 135–145)

## 2022-02-12 MED ORDER — VITAL 1.5 CAL PO LIQD
1000.0000 mL | ORAL | Status: DC
  Administered 2022-02-12 – 2022-02-22 (×9): 1000 mL
  Filled 2022-02-12 (×8): qty 1000

## 2022-02-12 MED ORDER — PROSOURCE TF PO LIQD
45.0000 mL | Freq: Every day | ORAL | Status: DC
  Administered 2022-02-13 – 2022-02-14 (×2): 45 mL
  Filled 2022-02-12 (×2): qty 45

## 2022-02-12 MED ORDER — NOREPINEPHRINE 4 MG/250ML-% IV SOLN
0.0000 ug/min | INTRAVENOUS | Status: DC
  Administered 2022-02-12: 8 ug/min via INTRAVENOUS
  Administered 2022-02-13 (×2): 10 ug/min via INTRAVENOUS
  Administered 2022-02-14: 11 ug/min via INTRAVENOUS
  Administered 2022-02-14: 9 ug/min via INTRAVENOUS
  Filled 2022-02-12 (×5): qty 250

## 2022-02-12 MED ORDER — HEPARIN SODIUM (PORCINE) 5000 UNIT/ML IJ SOLN
5000.0000 [IU] | Freq: Three times a day (TID) | INTRAMUSCULAR | Status: DC
  Administered 2022-02-12 – 2022-02-26 (×42): 5000 [IU] via SUBCUTANEOUS
  Filled 2022-02-12 (×42): qty 1

## 2022-02-12 MED ORDER — SODIUM ZIRCONIUM CYCLOSILICATE 10 G PO PACK
10.0000 g | PACK | Freq: Once | ORAL | Status: AC
Start: 2022-02-12 — End: 2022-02-12
  Administered 2022-02-12: 10 g
  Filled 2022-02-12: qty 1

## 2022-02-12 MED ORDER — SODIUM CHLORIDE 3 % IV SOLN
INTRAVENOUS | Status: DC
  Filled 2022-02-12: qty 500

## 2022-02-12 MED ORDER — SODIUM ZIRCONIUM CYCLOSILICATE 10 G PO PACK
10.0000 g | PACK | Freq: Once | ORAL | Status: AC
  Administered 2022-02-12: 10 g
  Filled 2022-02-12: qty 1

## 2022-02-12 MED ORDER — FUROSEMIDE 10 MG/ML IJ SOLN
40.0000 mg | Freq: Once | INTRAMUSCULAR | Status: AC
  Administered 2022-02-12: 40 mg via INTRAVENOUS
  Filled 2022-02-12: qty 4

## 2022-02-12 NOTE — Progress Notes (Signed)
?  Transition of Care (TOC) Screening Note ? ? ?Patient Details  ?Name: Vernon [name] ?Date of Birth: Oct 01, 1946 ? ? ?Transition of Care (TOC) CM/SW Contact:    ?Benard Halsted, LCSW ?Phone Number: ?02/12/2022, 9:42 AM ? ? ? ?Transition of Care Department [facility]) has reviewed patient and no TOC needs have been identified at this time. We will continue to monitor patient advancement through interdisciplinary progression rounds. If new patient transition needs arise, please place a TOC consult. ? ? ?

## 2022-02-12 NOTE — Telephone Encounter (Signed)
Wife called to cancel appointment and to inform care team that patient is in hospital. ?

## 2022-02-12 NOTE — Progress Notes (Signed)
Transported patient to CT Scan and back on full support.  Tolerated procedure well. ?

## 2022-02-12 NOTE — TOC CAGE-AID Note (Signed)
Transition of Care (TOC) - CAGE-AID Screening ? ? ?Patient Details  ?Name: Vernon [name] ?MRN: [id number] ?Date of Birth: 1946/05/31 ? ?Transition of Care (TOC) CM/SW Contact:    ?Vernis Eid C Tarpley-Carter, LCSWA ?Phone Number: ?02/12/2022, 3:00 PM ? ? ?Clinical Narrative: ?Pt is unable to participate in Cage Aid. ?CSW will assess at a better time. ? ?Passenger transport manager, MSW, LCSW-A ?Pronouns:  She/Her/Hers ?[facility] of [facility] ?Clinical Social Worker ?Direct Number:  [phone] ?[email or web address] ? ?CAGE-AID Screening: ?Substance Abuse Screening unable to be completed due to: : Patient unable to participate ? ?  ?  ?  ?  ?  ? ?Substance Abuse Education Offered: No ? ?  ? ? ? ? ? ? ?

## 2022-02-12 NOTE — Progress Notes (Signed)
Patient ID: Vernon [name], male   DOB: [birth year], 76 y.o.   MRN: [id number] ?Patient remains on ventilator support with sedation. ? ?CT today demonstrates no change in residual cerebellar hemorrhage.  Fourth ventricle remains open ventricles remain small.  I am pleased that there have been no further deteriorating changes.  We will continue to observe him and treat him medically for this process. ?

## 2022-02-12 NOTE — Progress Notes (Signed)
SLP Cancellation Note ? ?Patient Details ?Name: ROHIL LESCH ?MRN: [id number] ?DOB: 1946-02-07 ? ? ?Cancelled treatment:       Reason Eval/Treat Not Completed: Patient not medically ready (remains intubated). Will f/u if able. ? ? ? ?Osie Bond., M.A. CCC-SLP ?Acute Rehabilitation Services ?Pager [phone] ?Office [phone] ? ?02/12/2022, 10:46 AM ?

## 2022-02-12 NOTE — Telephone Encounter (Signed)
Noted  

## 2022-02-12 NOTE — Progress Notes (Signed)
STROKE TEAM PROGRESS NOTE  ? ?INTERVAL HISTORY ?Wife and son are at bedside.  Patient still intubated, on propofol and Levophed.  Not able to wean off propofol due to agitation.  Slight withdrawal in all extremities while on sedation. Na 157.  Repeat CT and MRI showed stable mass effect and no hydrocephalus. ? ? ?Vitals:  ? 02/12/22 0915 02/12/22 0930 02/12/22 0945 02/12/22 1000  ?BP:  (!) 114/58  121/63  ?Pulse: 63 66 67 68  ?Resp: '19 17 20 '$ (!) 25  ?Temp: 99.1 ?F (37.3 ?C) 99.1 ?F (37.3 ?C) 99.1 ?F (37.3 ?C) 99.1 ?F (37.3 ?C)  ?TempSrc:      ?SpO2: 90% 90% 90% (!) 88%  ?Weight:      ?Height:      ? ?CBC:  ?Recent Labs  ?Lab 02/10/22 ?1553 02/10/22 ?1956 02/12/22 ?5916 02/12/22 ?3846  ?WBC 8.2   < > 10.6* 11.0*  ?NEUTROABS 7.4  --  8.5*  --   ?HGB 8.9*   < > 9.0* 8.7*  ?HCT 28.4*   < > 28.9* 29.0*  ?MCV 95.6   < > 98.6 100.0  ?PLT 163   < > 190 207  ? < > = values in this interval not displayed.  ? ?Basic Metabolic Panel:  ?Recent Labs  ?Lab 02/11/22 ?1629 02/11/22 ?2234 02/12/22 ?0415  ?NA 149* 151* 150*  ?K 5.6*  --  5.2*  ?CL 123*  --  127*  ?CO2 18*  --  19*  ?GLUCOSE 136*  --  156*  ?BUN 37*  --  41*  ?CREATININE 1.73*  --  1.86*  ?CALCIUM 8.0*  --  7.9*  ?MG 1.6*  --  2.5*  ?PHOS 4.6  --  4.4  ? ?Lipid Panel:  ?Recent Labs  ?Lab 02/11/22 ?0423  ?CHOL 68  ?TRIG 79  77  ?HDL 27*  ?CHOLHDL 2.5  ?VLDL 16  ?[place] 25  ? ?HgbA1c:  ?Recent Labs  ?Lab 02/11/22 ?0423  ?HGBA1C 6.2*  ? ?Urine Drug Screen: No results for input(s): LABOPIA, COCAINSCRNUR, LABBENZ, AMPHETMU, THCU, LABBARB in the last 168 hours.  ?Alcohol Level No results for input(s): ETH in the last 168 hours. ? ?IMAGING past 24 hours ?MR BRAIN WO CONTRAST ? ?Result Date: 02/12/2022 ?CLINICAL DATA:  76 year old male postoperative day 2 status post suboccipital craniotomy and hematoma resection following acute left cerebellar hemorrhage. History of leukemia. Temporary posterior approach EVD in the OR. EXAM: MRI HEAD WITHOUT CONTRAST TECHNIQUE: Multiplanar,  multiecho pulse sequences of the brain and surrounding structures were obtained without intravenous contrast. COMPARISON:  Postoperative head CT 02/11/2022 and earlier. FINDINGS: Brain: Mixed hemorrhage and edema in the left cerebellar hemisphere and involving the left cerebellar peduncle. T2 hypointense blood products extending slightly across midline to the right and encompassing about 32 by 35 x 23 mm (AP by transverse by CC) for an estimated volume of 13 mL. Cerebellar edema involves the vermis. Mass effect on the 4th ventricle which does remain partially patent (series 10, image 6). And other basilar cisterns are patent with no tonsillar descent. Linear roughly 6 cm tract with edema and blood products from the right posterior occiput put toward the septum vergae toward splenium of the corpus callosum (series 10, image 13 and series 14, image 30) from attempted EVD placement. And heterogeneous DWI signal appears to traverse the splenium as seen on series 5, images 76 and 77. Trace IVH in the occipital horns.  No ventriculomegaly. Other punctate foci of trace DWI such as  series 5, image 82 is probably related to trace subarachnoid hemorrhage. No convincing acute infarct. Outside of the above findings there is patchy mild to moderate for age nonspecific white matter T2 and FLAIR hyperintensity. No underlying chronic intracranial blood products. No midline shift. Cervicomedullary junction and pituitary are within normal limits. Vascular: Major intracranial vascular flow voids are preserved. Skull and upper cervical spine: Suboccipital craniectomy on the left better demonstrated by CT. Decreased nonspecific T1 marrow signal in the visible spine. Calvarium marrow signal is more normal. Sinuses/Orbits: Negative orbits. Retained secretions in the pharynx. Mild right mastoid effusion and paranasal sinus mucosal thickening. Other: Intubated.  Postoperative changes to the scalp. IMPRESSION: 1. Left suboccipital  craniectomy for hematoma evacuation. Roughly 13 mL residual left cerebellar blood tracking across midline to the right. Cerebellar edema with partial effacement of the 4th ventricle. But basilar cistern patency is adequate, with no ventriculomegaly. 2. Sequelae of attempted EVD placement across the splenium of the corpus callosum. Trace intraventricular and subarachnoid hemorrhage. 3. Background mild to moderate nonspecific cerebral white matter signal changes. 4. 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 Electronically Signed   By: [name] M.D.   On: 02/12/2022 06:30  ? ?DG Abd Portable 1V ? ?Result Date: 02/11/2022 ?CLINICAL DATA:  Orogastric tube placement. EXAM: PORTABLE ABDOMEN - 1 VIEW COMPARISON:  Radiograph earlier today. FINDINGS: The tip and side port of the enteric tube are below the diaphragm in the region of the mid stomach. No small bowel dilatation in the upper abdomen. IMPRESSION: Tip and side port of the enteric tube below the diaphragm in the mid stomach. Electronically Signed   By: [name] M.D.   On: 02/11/2022 23:09  ? ?DG Abd Portable 1V ? ?Result Date: 02/11/2022 ?CLINICAL DATA:  NG tube placement. EXAM: PORTABLE ABDOMEN - 1 VIEW COMPARISON:  02/11/2022 chest radiograph FINDINGS: As the NG tube is not identified on the abdominal film, chest radiograph was performed as well. The tip of the NG tube is located overlying the UPPER mediastinum which may be in the esophagus or trachea. An endotracheal tube is again identified with tip a RIGHT IJ central venous catheter is again noted. Increasing bilateral LOWER lung opacities noted. No pneumothorax. IMPRESSION: 1. NG tube with tip overlying the UPPER mediastinum which may be in the esophagus or trachea. Recommend repositioning. 2. Increasing bilateral LOWER lung opacities/atelectasis/effusion. Electronically Signed   By: [name] M.D.   On: 02/11/2022 20:00  ? ?ECHOCARDIOGRAM COMPLETE ? ?Result  Date: 02/11/2022 ?   ECHOCARDIOGRAM REPORT   Patient Name:   Vernon [name] Date of Exam: 02/11/2022 Medical Rec #:  [id number]     Height:       69.0 in Accession #:    [id number]    Weight:       215.0 lb Date of Birth:  [birth year]     BSA:          2.130 m? Patient Age:    6 years      BP:           90/54 mmHg Patient Gender: M             HR:           44 bpm. Exam Location:  Inpatient Procedure: 2D Echo, Cardiac Doppler and Color Doppler Indications:    Stroke  History:        Patient has no prior history of Echocardiogram examinations.  Arrythmias:Atrial Fibrillation; Risk Factors:Diabetes and                 Dyslipidemia.  Sonographer:    Clayton Lefort RDCS (AE) Referring Phys: [place]  Sonographer Comments: Echo performed with patient supine and on artificial respirator. IMPRESSIONS  1. Left ventricular ejection fraction, by estimation, is 55 to 60%. The left ventricle has normal function. The left ventricle has no regional wall motion abnormalities. There is moderate left ventricular hypertrophy. Left ventricular diastolic parameters are indeterminate.  2. Right ventricular systolic function is normal. The right ventricular size is normal. There is moderately elevated pulmonary artery systolic pressure.  3. Left atrial size was severely dilated.  4. Right atrial size was mild to moderately dilated.  5. A small pericardial effusion is present. There is no evidence of cardiac tamponade.  6. Mild mitral valve regurgitation.  7. There is moderate calcification of the aortic valve. Aortic valve regurgitation is not visualized. Mild aortic valve stenosis. Aortic valve area, by VTI measures 1.03 cm?Marland Kitchen Aortic valve mean gradient measures 19.0 mmHg.  8. The inferior vena cava is dilated in size with <50% respiratory variability, suggesting right atrial pressure of 15 mmHg. FINDINGS  Left Ventricle: Left ventricular ejection fraction, by estimation, is 55 to 60%. The left ventricle has normal  function. The left ventricle has no regional wall motion abnormalities. The left ventricular internal cavity size was normal in size. There is  moderate left ventricular hypertrophy. Left ventricular diastolic paramet

## 2022-02-12 NOTE — Procedures (Signed)
[name]  Person Inserting Tube:  Amma Crear D, RD Tube Type:  [name] - 43 inches Tube Size:  10 Tube Location:  Left nare Secured by: Bridle Technique Used to Measure Tube Placement:  Marking at nare/corner of mouth [name] Secured At:  69 cm  [name] Tube Team Note:  Consult received to place a [name] feeding tube.   X-ray is required, abdominal x-ray has been ordered by the [name] team. Please confirm tube placement before using the [name] tube.   If the tube becomes dislodged please keep the tube and contact the [name] team at [email or web address] (password [email or web address]) for replacement.  If after hours and replacement cannot be delayed, place a NG tube and confirm placement with an abdominal x-ray.    Aj Crunkleton, RD, LDN Clinical Dietitian RD pager # available in AMION  After hours/weekend pager # available in AMION  

## 2022-02-12 NOTE — Progress Notes (Signed)
? ?NAME:  Vernon [name], MRN:  [id number], DOB:  1946-03-13, LOS: 2 ?ADMISSION DATE:  02/10/2022, CONSULTATION DATE:  3/18 ?REFERRING MD:  Sarita Haver, CHIEF COMPLAINT:  headache, weakness, nausea  ? ?History of Present Illness:  ?76 y/o male went to [facility] on 3/18 with weakness and nausea, found to have a cerebellar hemorrhage.  Repeat head CT showed expansion. He vomited and developed hypoxemia requiring intubation for airway protection.  He went for an emergent craniotomy on 3/18, hematoma evacuation and drain placement. PCCM consulted for ventilator management.  ? ?Pertinent  Medical History  ?Hypertension ?Anemia ?GERD ?DM2 ?Hiatal hernia ?Atrial fibrillation on eliquis ?Aortic stenosis ?Hypertension ?myelodysplasia ? ?Significant Hospital Events: ?Including procedures, antibiotic start and stop dates in addition to other pertinent events   ?3/18 admission, intubation for airway protection after vomiting, underwent Suboccipital craniectomy and evacuation of cerebellar hemorrhage.  Placement of right parieto-occipital ventriculostomy by Dr. Ellene Route ?3/19 repeat CT head > s/p left suboccipital craniectomy and cerebellar hematoma evacuation with moderate residual volume of posterior fossa blood, improved patency of 4th ventricle, decreased lateral and 3rd ventricle size; EVD in place, otherwise stable CT ?3/20  MRI Brain>>Left suboccipital craniectomy for hematoma evacuation. Roughly 13 mL residual left cerebellar blood tracking across midline to the right. Cerebellar edema with partial effacement of the 4th ventricle. But basilar cistern patency is adequate, with no ventriculomegaly.  ?Sequelae of attempted EVD placement across the splenium of the ?corpus callosum. Trace intraventricular and subarachnoid hemorrhage. ?Background mild to moderate nonspecific cerebral white matter ?signal changes. ?Nonspecific decreased bone marrow signal, might be sequelae of ?leukemia in this setting. ?  ?Interim History / Subjective:   ?Remains on 3% saline, propofol, fentanyl and Levophed ?Remains mechanically ventilated and sedated but weaning on CPAP PS of 8/5 ?If no plan for further neurosurgery, plan is to wean and possible extubated 3/20 ?BP elevated, HR low 60's to upper 50"s ? ?Labs reviewed : ?Na 150, K 5.2, Chloride 127,BUN 41, Creatinine 1.86 ( 1.73), Albumin 3.3  ?WBC 10.6, HGB 9.0, Platelets 190 ?Objective   ?Blood pressure 121/63, pulse 68, temperature 99.1 ?F (37.3 ?C), resp. rate (!) 25, height '5\' 9"'$  (1.753 m), weight 103.1 kg, SpO2 (!) 88 %. ?   ?Vent Mode: PRVC ?FiO2 (%):  [40 %-70 %] 50 % ?Set Rate:  [16 bmp] 16 bmp ?Vt Set:  [560 mL] 560 mL ?PEEP:  [5 cmH20-7 cmH20] 5 cmH20 ?Plateau Pressure:  [15 cmH20-17 cmH20] 15 cmH20  ? ?Intake/Output Summary (Last 24 hours) at 02/12/2022 1016 ?Last data filed at 02/12/2022 0900 ?Gross per 24 hour  ?Intake 4134.25 ml  ?Output 845 ml  ?Net 3289.25 ml  ? ?Filed Weights  ? 02/12/22 0423  ?Weight: 103.1 kg  ? ? ?Examination: ? ?General:  In bed on vent, currently sedated , per nursing does follow commands with decrease in sedation  ?HENT: suboccipital drain in place, dressing in place, scant bloody  drainage no redness to insertion site, ETT in place and secure ?PULM: Bilateral chest excursion, coarse with few rhonchi, diminished per bases ?CV: IRR, atrial fibrillation , Bounding heart sounds , no mgr ?GI: BS+, soft, nontender, ND, Body mass index is 33.57 kg/m?. ?MSK: normal bulk and tone, no obvious deformities ?Neuro: sedated, intubated  on vent ? ?3/19 CXR > improving bibasilar infiltrates ? ?Resolved Hospital Problem list   ? ? ?Assessment & Plan:  ?Acute respiratory failure with hypoxemia due inability to protect airway  ?Concern for aspiration of vomit,  ?Monitor off antibiotics ?Wean  down PEEP/FiO2 for Goal SaO2 > 88% ?CPAP Trial 3/20>> Consider Lasix as + Net fluid balance  ?VAP prevention ?Daily WUA/SBT ? ?Spontaneous Cerebellar hematoma, s/p evacuation on 3/18 ?At risk for ongoing  oozing today ?Continue to monitor neuro exam,  consider extubation if CT stable on 3/20 ?Secondary stroke prevention per neurology/stroke service/nsgy ?BP goals and glucose goals per stroke service ?Statin, antiplatelet agent per stroke service ?Imaging per stroke service ?PT/OT/SLP ?Drain management per neurosurgery ?Hold eliquis ? ?Moderate aortic valve stenosis, moderate MR/TR ?Hypertension ?Hyperlipidemia ?Chronic atrial fibrillation on eliquis ?Echo 3/19>> EF 55-60%, LV normal function, moderate LV hypertrophy, RV Normal function, Moderately elevated PASP, LA severely dilated, Small pericardial effusion , No tamponade, Mild MV regurg, moderate calcification of the aortic valve. Mild aortic valve stenosis. IVC dilated in size RAP about 15 mm HG ?Tele ?Hold eliquis ?Hold lisinopril ?Consider Lasix ? ?DM2 ?Add SSI ? ?Myelodysplasia, received 1 U PRBC in OR 3/18 ?Will need onc follow up after hospital visit ?Monitor for bleeding ?Transfuse PRBC for Hgb < 7 gm/dL ? ?AKI> new 3/19, oliguric ?615 cc UO last 24 hours ?Hold lisinopril ?Trend  BMET and UO ?Replace electrolytes as needed ?Repeat BMET later today ? ? ?Best Practice (right click and "Reselect all SmartList Selections" daily)  ? ?Diet/type: tubefeeds ?DVT prophylaxis: SCD ?GI prophylaxis: N/A ?Lines: Central line and yes and it is still needed ?Foley:  Yes, and it is still needed ?Code Status:  full code ?Last date of multidisciplinary goals of care discussion [per primary service] ? ?Labs   ?CBC: ?Recent Labs  ?Lab 02/10/22 ?1553 02/10/22 ?1956 02/11/22 ?0044 02/11/22 ?0157 02/11/22 ?0309 02/11/22 ?7829 02/12/22 ?0415  ?WBC 8.2  --   --   --   --  9.6 10.6*  ?NEUTROABS 7.4  --   --   --   --   --  8.5*  ?HGB 8.9*   < > 10.2* 9.5* 9.5* 9.3* 9.0*  ?HCT 28.4*   < > 30.0* 28.0* 28.0* 28.7* 28.9*  ?MCV 95.6  --   --   --   --  94.1 98.6  ?PLT 163  --   --   --   --  270 190  ? < > = values in this interval not displayed.  ? ? ?Basic Metabolic Panel: ?Recent Labs   ?Lab 02/10/22 ?1553 02/10/22 ?1956 02/11/22 ?0157 02/11/22 ?0309 02/11/22 ?0423 02/11/22 ?5621 02/11/22 ?1057 02/11/22 ?1217 02/11/22 ?1629 02/11/22 ?2234 02/12/22 ?0415  ?NA 137   < > 143 144   < > 142 145  --  149* 151* 150*  ?K 4.7   < > 5.7* 5.5*  --  5.5*  --   --  5.6*  --  5.2*  ?CL 110  --   --   --   --  117*  --   --  123*  --  127*  ?CO2 23  --   --   --   --  18*  --   --  18*  --  19*  ?GLUCOSE 171*  --   --   --   --  146*  --   --  136*  --  156*  ?BUN 33*  --   --   --   --  34*  --   --  37*  --  41*  ?CREATININE 1.24  --   --   --   --  1.62*  --   --  1.73*  --  1.86*  ?CALCIUM 9.1  --   --   --   --  8.4*  --   --  8.0*  --  7.9*  ?MG  --   --   --   --   --   --   --  1.6* 1.6*  --  2.5*  ?PHOS  --   --   --   --   --   --   --  4.9* 4.6  --  4.4  ? < > = values in this interval not displayed.  ? ?GFR: ?Estimated Creatinine Clearance: 40.6 mL/min (A) (by C-G formula based on SCr of 1.86 mg/dL (H)). ?Recent Labs  ?Lab 02/10/22 ?1553 02/11/22 ?3299 02/12/22 ?0415  ?WBC 8.2 9.6 10.6*  ? ? ?Liver Function Tests: ?Recent Labs  ?Lab 02/10/22 ?1553 02/12/22 ?0415  ?AST 24 21  ?ALT 18 19  ?ALKPHOS 92 72  ?BILITOT 3.3* 3.2*  ?PROT 7.2 5.9*  ?ALBUMIN 3.9 3.3*  ? ?No results for input(s): LIPASE, AMYLASE in the last 168 hours. ?No results for input(s): AMMONIA in the last 168 hours. ? ?ABG ?   ?Component Value Date/Time  ? PHART 7.274 (L) 02/11/2022 0309  ? PCO2ART 41.5 02/11/2022 0309  ? PO2ART 87 02/11/2022 0309  ? HCO3 19.4 (L) 02/11/2022 0309  ? TCO2 21 (L) 02/11/2022 0309  ? ACIDBASEDEF 7.0 (H) 02/11/2022 0309  ? O2SAT 96 02/11/2022 0309  ?  ? ?Coagulation Profile: ?Recent Labs  ?Lab 02/10/22 ?1553  ?INR 1.5*  ? ? ?Cardiac Enzymes: ?No results for input(s): CKTOTAL, CKMB, CKMBINDEX, TROPONINI in the last 168 hours. ? ?HbA1C: ?Hgb A1c MFr Bld  ?Date/Time Value Ref Range Status  ?02/11/2022 04:23 AM 6.2 (H) 4.8 - 5.6 % Final  ?  Comment:  ?  (NOTE) ?Pre diabetes:          5.7%-6.4% ? ?Diabetes:               >6.4% ? ?Glycemic control for   <7.0% ?adults with diabetes ?  ? ? ?CBG: ?Recent Labs  ?Lab 02/11/22 ?1614 02/11/22 ?1948 02/11/22 ?2319 02/12/22 ?2426 02/12/22 ?0825  ?GLUCAP 137* 169* 145* 132* 1

## 2022-02-12 NOTE — Progress Notes (Signed)
PT Cancellation Note ? ?Patient Details ?Name: Vernon [name] ?MRN: [id number] ?DOB: Sep 18, 1946 ? ? ?Cancelled Treatment:    Reason Eval/Treat Not Completed: Medical issues which prohibited therapy - awaiting extubation,  PT to check back. ? ?Stacie Glaze, PT DPT ?Acute Rehabilitation Services ?Pager [phone]  ?Office [phone] ? ? ? ?[name] ?02/12/2022, 3:11 PM ?

## 2022-02-12 NOTE — Progress Notes (Signed)
Initial Nutrition Assessment ? ?DOCUMENTATION CODES:  ? ?Not applicable ? ?INTERVENTION:  ? ?Tube Feeds via [name]/OG: ?Vital 1.5 @ 60 mL/hr (1440 mL/day) ?45 mL ProSource TF - Daily ?Provides 2200 kcal, 108 grams of protein, 1100 mL free water daily.  ? ?NUTRITION DIAGNOSIS:  ? ?Inadequate oral intake related to inability to eat as evidenced by NPO status. ? ?GOAL:  ? ?Patient will meet greater than or equal to 90% of their needs ? ?MONITOR:  ? ?Vent status, Labs, Weight trends ? ?REASON FOR ASSESSMENT:  ? ?Consult, Ventilator ?Enteral/tube feeding initiation and management ? ?ASSESSMENT:  ? ?76 y.o. male presented to [facility] ED with worsening L side coordination, vomiting, and upset stomach. PMH includes GERD, T2DM, HTN, and A. Fib. Pt admitted with an L ICH.  ? ?3/18 - Intubated  ?3/19 - suboccipital craniectomy w/ R ventriculostomy ? ?Spoke with family at bedside.  ?Family reports that they have noticed that he is eating less, but has not noticed a change in his appetite. Reports that pt has ONS at home and knows that he is drinking it, but unsure how often that he drinks it. Reports that pt has dentures and that sometimes that can impact his chewing.  ? ?Family reports that he has lost some weight, states that he was 230# and now is 215#. Family reports that weight loss has occurred over a period of a time.  ? ?Patient is currently intubated on ventilator support. ?MV: 10.1 L/min ?Temp (24hrs), Avg:99.1 ?F (37.3 ?C), Min:98.8 ?F (37.1 ?C), Max:99.3 ?F (37.4 ?C) ?Continuous Medications: ?Propofol: 5.9 ml/hr (156 kcal/day) ?Fentanyl ?Levophed ? ?Plan to place [name] today.  ? ?Medications reviewed and include: Colace, SSI 0-15 units q4h, Protonix, Senokot ?Labs reviewed: Sodium 150, Potassium 5.2, BUN 41, Creatinine 1.86, Magnesium 2.5, Hgb A1c 6.2%, 24 hr CBG 132-169 ? ?NUTRITION - FOCUSED PHYSICAL EXAM: ? ?Flowsheet Row Most Recent Value  ?Orbital Region Unable to assess  ?Upper Arm Region No depletion   ?Thoracic and Lumbar Region No depletion  ?Buccal Region Unable to assess  ?Temple Region No depletion  ?Clavicle Bone Region Mild depletion  ?Clavicle and Acromion Bone Region Mild depletion  ?Scapular Bone Region Unable to assess  ?Dorsal Hand Unable to assess  ?Patellar Region No depletion  ?Anterior Thigh Region No depletion  ?Posterior Calf Region No depletion  ?Edema (RD Assessment) None  ?Hair Reviewed  ?Eyes Unable to assess  ?Mouth Unable to assess  ?Skin Reviewed  ?Nails Unable to assess  ? ?  ? ? ?Diet Order:   ?Diet Order   ? ?       ?  Diet NPO time specified  Diet effective now       ?  ? ?  ?  ? ?  ? ?EDUCATION NEEDS:  ? ?Not appropriate for education at this time ? ?Skin:  Skin Assessment: Reviewed RN Assessment ? ?Last BM:  Prior to Admission ? ?Height:  ?Ht Readings from Last 1 Encounters:  ?02/10/22 '5\' 9"'$  (1.753 m)  ? ?Weight:  ?Wt Readings from Last 1 Encounters:  ?02/12/22 103.1 kg  ? ?Ideal Body Weight:  72.7 kg ? ?BMI:  Body mass index is 33.57 kg/m?. ? ?Estimated Nutritional Needs:  ? ?Kcal:  2100-2300 ? ?Protein:  105-120 grams ? ?Fluid:  2.1-2.3 L ? ? ?Hermina Barters RD, LDN ?Clinical Dietitian ?See AMiON for contact information.  ? ?

## 2022-02-13 ENCOUNTER — Encounter: Payer: Self-pay | Admitting: Internal Medicine

## 2022-02-13 ENCOUNTER — Inpatient Hospital Stay (HOSPITAL_COMMUNITY): Payer: Medicare Other

## 2022-02-13 DIAGNOSIS — I952 Hypotension due to drugs: Secondary | ICD-10-CM | POA: Diagnosis not present

## 2022-02-13 DIAGNOSIS — J9621 Acute and chronic respiratory failure with hypoxia: Secondary | ICD-10-CM

## 2022-02-13 DIAGNOSIS — G9340 Encephalopathy, unspecified: Secondary | ICD-10-CM | POA: Diagnosis not present

## 2022-02-13 DIAGNOSIS — J90 Pleural effusion, not elsewhere classified: Secondary | ICD-10-CM | POA: Diagnosis not present

## 2022-02-13 DIAGNOSIS — J9601 Acute respiratory failure with hypoxia: Secondary | ICD-10-CM | POA: Diagnosis not present

## 2022-02-13 DIAGNOSIS — G911 Obstructive hydrocephalus: Secondary | ICD-10-CM | POA: Diagnosis not present

## 2022-02-13 DIAGNOSIS — Z9889 Other specified postprocedural states: Secondary | ICD-10-CM | POA: Diagnosis not present

## 2022-02-13 DIAGNOSIS — I614 Nontraumatic intracerebral hemorrhage in cerebellum: Secondary | ICD-10-CM | POA: Diagnosis not present

## 2022-02-13 LAB — COMPREHENSIVE METABOLIC PANEL
ALT: 17 U/L (ref 0–44)
AST: 21 U/L (ref 15–41)
Albumin: 3.2 g/dL — ABNORMAL LOW (ref 3.5–5.0)
Alkaline Phosphatase: 74 U/L (ref 38–126)
BUN: 45 mg/dL — ABNORMAL HIGH (ref 8–23)
CO2: 19 mmol/L — ABNORMAL LOW (ref 22–32)
Calcium: 8.1 mg/dL — ABNORMAL LOW (ref 8.9–10.3)
Chloride: >130 mmol/L (ref 98–111)
Creatinine, Ser: 2.03 mg/dL — ABNORMAL HIGH (ref 0.61–1.24)
GFR, Estimated: 34 mL/min — ABNORMAL LOW (ref >60)
Glucose, Bld: 199 mg/dL — ABNORMAL HIGH (ref 70–99)
Potassium: 4.5 mmol/L (ref 3.5–5.1)
Sodium: 161 mmol/L (ref 135–145)
Total Bilirubin: 2.6 mg/dL — ABNORMAL HIGH (ref 0.3–1.2)
Total Protein: 5.8 g/dL — ABNORMAL LOW (ref 6.5–8.1)

## 2022-02-13 LAB — BODY FLUID CELL COUNT WITH DIFFERENTIAL
Eos, Fluid: 0 %
Lymphs, Fluid: 88 %
Monocyte-Macrophage-Serous Fluid: 6 % — ABNORMAL LOW (ref 50–90)
Neutrophil Count, Fluid: 6 % (ref 0–25)
Total Nucleated Cell Count, Fluid: 480 cu mm (ref 0–1000)

## 2022-02-13 LAB — CBC WITH DIFFERENTIAL/PLATELET
Abs Immature Granulocytes: 0.1 10*3/uL — ABNORMAL HIGH (ref 0.00–0.07)
Basophils Absolute: 0 10*3/uL (ref 0.0–0.1)
Basophils Relative: 0 %
Eosinophils Absolute: 0 10*3/uL (ref 0.0–0.5)
Eosinophils Relative: 0 %
HCT: 26.9 % — ABNORMAL LOW (ref 39.0–52.0)
Hemoglobin: 7.9 g/dL — ABNORMAL LOW (ref 13.0–17.0)
Immature Granulocytes: 1 %
Lymphocytes Relative: 7 %
Lymphs Abs: 0.7 10*3/uL (ref 0.7–4.0)
MCH: 29.9 pg (ref 26.0–34.0)
MCHC: 29.4 g/dL — ABNORMAL LOW (ref 30.0–36.0)
MCV: 101.9 fL — ABNORMAL HIGH (ref 80.0–100.0)
Monocytes Absolute: 0.6 10*3/uL (ref 0.1–1.0)
Monocytes Relative: 7 %
Neutro Abs: 7.8 10*3/uL — ABNORMAL HIGH (ref 1.7–7.7)
Neutrophils Relative %: 85 %
Platelets: 170 10*3/uL (ref 150–400)
RBC: 2.64 MIL/uL — ABNORMAL LOW (ref 4.22–5.81)
RDW: 29 % — ABNORMAL HIGH (ref 11.5–15.5)
WBC: 9.3 10*3/uL (ref 4.0–10.5)
nRBC: 0.4 % — ABNORMAL HIGH (ref 0.0–0.2)

## 2022-02-13 LAB — SODIUM
Sodium: 156 mmol/L — ABNORMAL HIGH (ref 135–145)
Sodium: 162 mmol/L (ref 135–145)

## 2022-02-13 LAB — PROTEIN, PLEURAL OR PERITONEAL FLUID: Total protein, fluid: <3 g/dL

## 2022-02-13 LAB — PHOSPHORUS: Phosphorus: 3.4 mg/dL (ref 2.5–4.6)

## 2022-02-13 LAB — POCT I-STAT 7, (LYTES, BLD GAS, ICA,H+H)
Acid-base deficit: 5 mmol/L — ABNORMAL HIGH (ref 0.0–2.0)
Bicarbonate: 20.2 mmol/L (ref 20.0–28.0)
Calcium, Ion: 1.22 mmol/L (ref 1.15–1.40)
HCT: 24 % — ABNORMAL LOW (ref 39.0–52.0)
Hemoglobin: 8.2 g/dL — ABNORMAL LOW (ref 13.0–17.0)
O2 Saturation: 92 %
Patient temperature: 37.9
Potassium: 5.4 mmol/L — ABNORMAL HIGH (ref 3.5–5.1)
Sodium: 164 mmol/L (ref 135–145)
TCO2: 21 mmol/L — ABNORMAL LOW (ref 22–32)
pCO2 arterial: 38 mmHg (ref 32–48)
pH, Arterial: 7.338 — ABNORMAL LOW (ref 7.35–7.45)
pO2, Arterial: 72 mmHg — ABNORMAL LOW (ref 83–108)

## 2022-02-13 LAB — LACTATE DEHYDROGENASE: LDH: 181 U/L (ref 98–192)

## 2022-02-13 LAB — GLUCOSE, CAPILLARY
Glucose-Capillary: 153 mg/dL — ABNORMAL HIGH (ref 70–99)
Glucose-Capillary: 159 mg/dL — ABNORMAL HIGH (ref 70–99)
Glucose-Capillary: 164 mg/dL — ABNORMAL HIGH (ref 70–99)
Glucose-Capillary: 167 mg/dL — ABNORMAL HIGH (ref 70–99)
Glucose-Capillary: 191 mg/dL — ABNORMAL HIGH (ref 70–99)
Glucose-Capillary: 254 mg/dL — ABNORMAL HIGH (ref 70–99)

## 2022-02-13 LAB — MAGNESIUM: Magnesium: 2.3 mg/dL (ref 1.7–2.4)

## 2022-02-13 LAB — PROTEIN, TOTAL: Total Protein: 5.7 g/dL — ABNORMAL LOW (ref 6.5–8.1)

## 2022-02-13 LAB — LACTATE DEHYDROGENASE, PLEURAL OR PERITONEAL FLUID: LD, Fluid: 85 U/L — ABNORMAL HIGH (ref 3–23)

## 2022-02-13 MED ORDER — FUROSEMIDE 10 MG/ML IJ SOLN
40.0000 mg | Freq: Once | INTRAMUSCULAR | Status: AC
  Administered 2022-02-13: 40 mg via INTRAVENOUS
  Filled 2022-02-13: qty 4

## 2022-02-13 MED ORDER — PANTOPRAZOLE 2 MG/ML SUSPENSION
40.0000 mg | Freq: Every day | ORAL | Status: DC
  Administered 2022-02-13 – 2022-02-14 (×2): 40 mg
  Filled 2022-02-13 (×2): qty 20

## 2022-02-13 MED ORDER — DEXMEDETOMIDINE HCL IN NACL 400 MCG/100ML IV SOLN
0.0000 ug/kg/h | INTRAVENOUS | Status: DC
  Administered 2022-02-13: 1.2 ug/kg/h via INTRAVENOUS
  Administered 2022-02-13: 0.6 ug/kg/h via INTRAVENOUS
  Administered 2022-02-13: 0.4 ug/kg/h via INTRAVENOUS
  Administered 2022-02-13: 0.8 ug/kg/h via INTRAVENOUS
  Administered 2022-02-14: 0.4 ug/kg/h via INTRAVENOUS
  Administered 2022-02-14: 0.8 ug/kg/h via INTRAVENOUS
  Administered 2022-02-14 – 2022-02-15 (×3): 0.7 ug/kg/h via INTRAVENOUS
  Filled 2022-02-13 (×8): qty 100

## 2022-02-13 MED ORDER — SODIUM CHLORIDE 0.9 % IV SOLN
3.0000 g | Freq: Three times a day (TID) | INTRAVENOUS | Status: AC
  Administered 2022-02-13 – 2022-02-18 (×15): 3 g via INTRAVENOUS
  Filled 2022-02-13 (×15): qty 8

## 2022-02-13 MED ORDER — FENTANYL CITRATE PF 50 MCG/ML IJ SOSY
25.0000 ug | PREFILLED_SYRINGE | INTRAMUSCULAR | Status: DC | PRN
  Administered 2022-02-13 – 2022-02-14 (×6): 50 ug via INTRAVENOUS
  Administered 2022-02-14: 25 ug via INTRAVENOUS
  Filled 2022-02-13 (×8): qty 1

## 2022-02-13 MED ORDER — FUROSEMIDE 10 MG/ML IJ SOLN: 40.0000 mg | Freq: Once | INTRAMUSCULAR | Status: DC

## 2022-02-13 MED ORDER — FENTANYL CITRATE PF 50 MCG/ML IJ SOSY
25.0000 ug | PREFILLED_SYRINGE | INTRAMUSCULAR | Status: AC | PRN
  Administered 2022-02-13 – 2022-02-14 (×3): 25 ug via INTRAVENOUS

## 2022-02-13 MED ORDER — POLYETHYLENE GLYCOL 3350 17 G PO PACK
17.0000 g | PACK | Freq: Every day | ORAL | Status: DC
  Administered 2022-02-13: 17 g
  Filled 2022-02-13: qty 1

## 2022-02-13 MED ORDER — QUETIAPINE FUMARATE 25 MG PO TABS
25.0000 mg | ORAL_TABLET | Freq: Two times a day (BID) | ORAL | Status: DC
  Administered 2022-02-13: 25 mg
  Filled 2022-02-13: qty 1

## 2022-02-13 NOTE — Procedures (Signed)
Bed side chest [place]: ? ?Small L sided pleural effusion with lung atelectasis and Moderate to large R sided pleural effusion. ? ? ? ? ? ? ? ? ?

## 2022-02-13 NOTE — Progress Notes (Signed)
PT Cancellation Note ? ?Patient Details ?Name: DEANTHONY MAULL ?MRN: [id number] ?DOB: 1946-04-24 ? ? ?Cancelled Treatment:    Reason Eval/Treat Not Completed: Medical issues which prohibited therapy (pt weaning this AM, may be able to see in the PM).  ? ?Leighton Roach, PT  ?Acute Rehab Services ? Pager [phone] ?Office [phone] ? ? ? ?[place] ?02/13/2022, 8:24 AM ?

## 2022-02-13 NOTE — Progress Notes (Signed)
Date and time results received: 02/13/22 1445 ?(use smartphrase ".now" to insert current time) ? ?Test: Na ?Critical Value: 162 ? ?Name of Provider Notified: Chand ? ?Orders Received? Or Actions Taken?:  no new orders ?

## 2022-02-13 NOTE — Progress Notes (Signed)
STROKE TEAM PROGRESS NOTE  ? ?INTERVAL HISTORY ?Son stepped out for the thoracentesis procedure. Pt still intubated on sedation. Not responsive. Na 161, d/c 3% saline.  ? ? ?Vitals:  ? 02/13/22 0600 02/13/22 0700 02/13/22 0800 02/13/22 0811  ?BP: (!) 122/54 133/63 (!) 189/82   ?Pulse: 60 67 98 60  ?Resp: 15 20 (!) 25 (!) 23  ?Temp: 99.3 ?F (37.4 ?C) 99.3 ?F (37.4 ?C) 99 ?F (37.2 ?C) 99.1 ?F (37.3 ?C)  ?TempSrc:   Bladder   ?SpO2: 95% 94% 94% 92%  ?Weight:      ?Height:      ? ?CBC:  ?Recent Labs  ?Lab 02/10/22 ?1553 02/10/22 ?1956 02/12/22 ?4496 02/12/22 ?7591  ?WBC 8.2   < > 10.6* 11.0*  ?NEUTROABS 7.4  --  8.5*  --   ?HGB 8.9*   < > 9.0* 8.7*  ?HCT 28.4*   < > 28.9* 29.0*  ?MCV 95.6   < > 98.6 100.0  ?PLT 163   < > 190 207  ? < > = values in this interval not displayed.  ? ?Basic Metabolic Panel:  ?Recent Labs  ?Lab 02/12/22 ?0415 02/12/22 ?6384 02/12/22 ?1341 02/12/22 ?1601 02/12/22 ?2200 02/13/22 ?0426  ?NA 150* 156*   < >  --  158* 156*  ?K 5.2* 5.4*  --   --   --   --   ?CL 127* >130*  --   --   --   --   ?CO2 19* 18*  --   --   --   --   ?GLUCOSE 156* 195*  --   --   --   --   ?BUN 41* 44*  --   --   --   --   ?CREATININE 1.86* 1.83*  --   --   --   --   ?CALCIUM 7.9* 8.1*  --   --   --   --   ?MG 2.5*  --   --  2.4  --   --   ?PHOS 4.4  --   --  3.6  --   --   ? < > = values in this interval not displayed.  ? ?Lipid Panel:  ?Recent Labs  ?Lab 02/11/22 ?0423  ?CHOL 68  ?TRIG 79  77  ?HDL 27*  ?CHOLHDL 2.5  ?VLDL 16  ?Switzer 25  ? ?HgbA1c:  ?Recent Labs  ?Lab 02/11/22 ?0423  ?HGBA1C 6.2*  ? ?Urine Drug Screen: No results for input(s): LABOPIA, COCAINSCRNUR, LABBENZ, AMPHETMU, THCU, LABBARB in the last 168 hours.  ?Alcohol Level No results for input(s): ETH in the last 168 hours. ? ?IMAGING past 24 hours ?DG Abd 1 View ? ?Result Date: 02/12/2022 ?CLINICAL DATA:  Encounter for feeding tube placement. EXAM: ABDOMEN - 1 VIEW COMPARISON:  Abdominal radiograph yesterday FINDINGS: The previous enteric tube has been  removed. There is a new weighted enteric tube in place with tip below the diaphragm to the right of midline, in the region of the distal stomach or proximal duodenum. Small amount of high-density material in the gastric cardia. IMPRESSION: New weighted enteric tube in place with tip below the diaphragm to the right of midline, in the region of the distal stomach or proximal duodenum. Electronically Signed   By: [name] M.D.   On: 02/12/2022 16:13  ? ?CT HEAD WO CONTRAST (5MM) ? ?Result Date: 02/12/2022 ?CLINICAL DATA:  Hemorrhagic stroke.  Altered mental status. EXAM: CT HEAD WITHOUT CONTRAST TECHNIQUE: Contiguous  axial images were obtained from the base of the skull through the vertex without intravenous contrast. RADIATION DOSE REDUCTION: This exam was performed according to the departmental dose-optimization program which includes automated exposure control, adjustment of the mA and/or kV according to patient size and/or use of iterative reconstruction technique. COMPARISON:  Head CT yesterday.  MRI today. FINDINGS: Brain: Previous left occipital craniectomy for hematoma evacuation and decompression. Similar appearance of the residual blood products within the left cerebellum. No evidence of additional bleeding or mass effect. Mild mass-effect upon the pons as seen previously. Previous rib right posterior parietal burr hole for ventriculostomy. Ventriculostomy subsequently removed with mild edema and a small amount of hemorrhage along at track. Small amount of subarachnoid blood previously seen is resolving. Ventricular size is stable. No layering intraventricular blood. Vascular: No vascular finding. Skull: Otherwise negative. Sinuses/Orbits: Clear/normal Other: None IMPRESSION: No unexpected or new unfavorable finding. No additional hemorrhage in the left cerebellum following surgery. Only mild mass-effect upon the pons presently. Ventricular size is stable. Mild edema and hemorrhage along the previous  right parietal intervention track, not increased. Small amount of subarachnoid blood, diminishing by CT. Electronically Signed   By: Nelson Chimes M.D.   On: 02/12/2022 14:42  ? ?DG CHEST PORT 1 VIEW ? ?Result Date: 02/13/2022 ?CLINICAL DATA:  ETT,HX left cerebellar ICH,CRANIOTOMY EXAM: PORTABLE CHEST 1 VIEW COMPARISON:  February 12, 2022. FINDINGS: Similar versus slightly increased hazy right greater than left basilar opacities, likely a combination of layering pleural effusions and overlying atelectasis and/or consolidation. No visible pneumothorax. Right IJ central venous catheter with the tip projecting near the superior cavoatrial junction when accounting for patient rotation. Enteric tube courses below diaphragm in tip is outside the field of view. Similar enlarged cardiac silhouette. Endotracheal tube tip projects approximately 1 cm above the level of the clavicular heads. IMPRESSION: 1. Endotracheal tube tip projects approximately 1 cm above the level of the clavicular heads. Consider advancement. 2. Similar versus slightly increased hazy right greater than left basilar opacities, likely a combination of layering pleural effusions and overlying atelectasis and/or consolidation. Electronically Signed   By: Margaretha Sheffield M.D.   On: 02/13/2022 07:58  ? ?DG CHEST PORT 1 VIEW ? ?Result Date: 02/12/2022 ?CLINICAL DATA:  Evaluate lungs EXAM: PORTABLE CHEST 1 VIEW COMPARISON:  Chest radiograph dated February 11, 2022 FINDINGS: Unchanged position of ET tube, enteric tube and right IJ line. Cardiac and mediastinal contours are unchanged. Right greater than left lower lung predominant opacities which are likely combination of atelectasis and layering pleural effusions. No evidence of pneumothorax. IMPRESSION: Right greater than left lower lung predominant opacities which are likely combination of atelectasis and layering pleural effusions. Electronically Signed   By: Yetta Glassman M.D.   On: 02/12/2022 12:51    ? ?PHYSICAL EXAM ?General:  Intubated, well-developed patient in no acute distress with hemovac drain in suboccipital regio draining sanguineous drainage ?Respiratory: Respirations synchronous with ventilator ?Cardio: irregularly irregular heart rate and rhythm. ?Neurological  ?On exam, intubated on sedation, eyes closed, hardly open with voice, but not following commands. With forced eye opening, eyes in mid position, not blinking to visual threat, spontaneous eye rolling movement, not tracking, PERRL. Corneal reflex bilaterally present, gag and cough present. Breathing  over the vent.  Facial symmetry not able to test due to ET tube.  Tongue protrusion not cooperative. On pain stimulation, slight withdrawal movement in all extremities. No babinski. Sensation, coordination and gait not tested. ? ?ASSESSMENT/PLAN ?Vernon [name] is  a 76 y.o. male with history of anemia, arthritis, GERD, DM2, HTN and a-fib on Eliquis presenting with vomiting and incoordiantion on the left side.  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  His Eliquis was reversed with [name] and he was transferred here.  His neurological status began to worsen and he was intubated.  Repeat CT demonstrated enlarging ICH.  Neurosurgery was consulted and decompressive suboccipital craniectomy was performed on 3/19 by Dr Ellene Route.  3% HTS was started for cerebral edema.. 23.5% HTS bolus will be given as Na is still at 145 despite 3% HTS. ? ?ICH:  left cerebellar ICH s/p suboccipital decompression and hematoma evacuation likely due to Eliquis use and hypertension ?Code Stroke CT head acute 2.2 cm IPH in middle cerebellar peduncle with surrounding edema ?CTA head & neck no LVO or hemodynamically significant stenosis, no evidence of AVM of aneurysm, moderate right vertebral artery stenosis ?Follow up CT 3/18 increased size of left cerebellar IPH with effaced fourth ventricle ?Follow up CT 3/19 s/p left suboccipital craniectomy and cerebellar hematoma  evacuation with moderate volume of residual posterior fossa blood ?MRI - 02/12/22 - Left suboccipital craniectomy for hematoma evacuation. Roughly 13 mL residual left cerebellar blood tracking across midline to the

## 2022-02-13 NOTE — Progress Notes (Signed)
Date and time results received: 02/13/22 1140 ?(use smartphrase ".now" to insert current time) ? ?Test: Na ?Critical Value: 161 ? ?Test: Cl ?Critical Value: >130 ? ?Name of Provider Notified: Tacy Learn and Erlinda Hong ? ?Orders Received? Or Actions Taken?:     stop 3% ?

## 2022-02-13 NOTE — Procedures (Signed)
Thoracentesis  Procedure Note ? ?Vernon [name]  ?[id number]  ?1946/08/12 ? ?Date:02/13/22  ?Time:10:42 AM  ? ?Provider Performing:Armen Waring  ? ?Procedure: Thoracentesis with imaging guidance (57903) ? ?Indication(s) ?Pleural Effusion ? ?Consent ?Risks of the procedure as well as the alternatives and risks of each were explained to the patient and/or caregiver.  Consent for the procedure was obtained and is signed in the bedside chart ? ?Anesthesia ?Topical only with 1% lidocaine  ? ? ?Time Out ?Verified patient identification, verified procedure, site/side was marked, verified correct patient position, special equipment/implants available, medications/allergies/relevant history reviewed, required imaging and test results available. ? ? ?Sterile Technique ?Maximal sterile technique including full sterile barrier drape, hand hygiene, sterile gown, sterile gloves, mask, hair covering, sterile ultrasound probe cover (if used). ? ?Procedure Description ?Ultrasound was used to identify appropriate pleural anatomy for placement and overlying skin marked.  Area of drainage cleaned and draped in sterile fashion. Lidocaine was used to anesthetize the skin and subcutaneous tissue.  1250 cc's of Dark yellow appearing fluid was drained from the right pleural space. Catheter then removed and bandaid applied to site. ? ? ?Complications/Tolerance ?None; patient tolerated the procedure well. ?Chest X-ray is ordered to confirm no post-procedural complication. ? ? ?EBL ?Minimal ? ? ?Specimen(s) ?Pleural fluid ? ? ?

## 2022-02-13 NOTE — Progress Notes (Signed)
OT Cancellation Note ? ?Patient Details ?Name: Vernon [name] ?MRN: [id number] ?DOB: 1946-04-29 ? ? ?Cancelled Treatment:    Reason Eval/Treat Not Completed: Medical issues which prohibited therapy (Planning for extubation this am, OT evaluation to f/u as appropriate.) ? ?Vani Gunner A Sarenity Ramaker ?02/13/2022, 8:18 AM ?

## 2022-02-13 NOTE — Progress Notes (Addendum)
? ?NAME:  CONO GEBHARD, MRN:  [id number], DOB:  05/27/1946, LOS: 3 ?ADMISSION DATE:  02/10/2022, CONSULTATION DATE:  3/18 ?REFERRING MD:  Sarita Haver, CHIEF COMPLAINT:  headache, weakness, nausea  ? ?History of Present Illness:  ?76 y/o male went to [facility] on 3/18 with weakness and nausea, found to have a cerebellar hemorrhage.  Repeat head CT showed expansion. He vomited and developed hypoxemia requiring intubation for airway protection.  He went for an emergent craniotomy on 3/18, hematoma evacuation and drain placement. PCCM consulted for ventilator management.  ? ?Pertinent  Medical History  ?Hypertension ?Anemia ?GERD ?DM2 ?Hiatal hernia ?Atrial fibrillation on eliquis ?Aortic stenosis ?Hypertension ?myelodysplasia ? ?Significant Hospital Events: ?Including procedures, antibiotic start and stop dates in addition to other pertinent events   ?3/18 admission, intubation for airway protection after vomiting, underwent Suboccipital craniectomy and evacuation of cerebellar hemorrhage.  Placement of right parieto-occipital ventriculostomy by Dr. Ellene Route ?3/19 repeat CT head > s/p left suboccipital craniectomy and cerebellar hematoma evacuation with moderate residual volume of posterior fossa blood, improved patency of 4th ventricle, decreased lateral and 3rd ventricle size; EVD in place, otherwise stable CT ?3/20  MRI Brain>>Left suboccipital craniectomy for hematoma evacuation. Roughly 13 mL residual left cerebellar blood tracking across midline to the right. Cerebellar edema with partial effacement of the 4th ventricle. But basilar cistern patency is adequate, with no ventriculomegaly. Sequelae of attempted EVD placement across the splenium of the corpus callosum. Trace intraventricular and subarachnoid hemorrhage. Background mild to moderate nonspecific cerebral white matter signal changes. 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 ?3/21: patient on PSV; possibly extubate  today; thoracentesis for R pleural effusion; off pressors ? ?  ?Interim History / Subjective:  ?On hypertonic saline ?Sedation just turned off; patient wiggling extremities but not following commands ?Intubated on PSV 8/5 doing well ?BP elevated after sedation turned off ? ?Objective   ?Blood pressure (!) 122/54, pulse 60, temperature 99.3 ?F (37.4 ?C), resp. rate 15, height '5\' 9"'$  (1.753 m), weight 102.6 kg, SpO2 95 %. ?   ?Vent Mode: PRVC ?FiO2 (%):  [40 %-50 %] 50 % ?Set Rate:  [16 bmp] 16 bmp ?Vt Set:  [560 mL] 560 mL ?PEEP:  [5 cmH20] 5 cmH20 ?Pressure Support:  [8 cmH20] 8 cmH20 ?Plateau Pressure:  [11 cmH20-15 cmH20] 12 cmH20  ? ?Intake/Output Summary (Last 24 hours) at 02/13/2022 4008 ?Last data filed at 02/13/2022 0700 ?Gross per 24 hour  ?Intake 3644.16 ml  ?Output 3170 ml  ?Net 474.16 ml  ? ? ?Filed Weights  ? 02/12/22 0423 02/13/22 0500  ?Weight: 103.1 kg 102.6 kg  ? ? ?Examination: ? ?General:  critically ill appearing on mech vent ?HEENT: MM pink/moist; ETT/EVD in place ?Neuro: Moving extremities involuntarily; not following commands; PERRL ?CV: s1s2, no m/r/g ?PULM:  dim clear BS bilaterally; on mech vent PRVC ?GI: soft, bsx4 active  ?Extremities: warm/dry ?Skin: no rashes or lesions appreciated ? ? ?Resolved Hospital Problem list   ? ? ?Assessment & Plan:  ?Acute respiratory failure with hypoxemia due inability to protect airway  ?Right sided Pleural effusion ?P: ?-continue PSV trials ?-consider extubation today ?-VAP prevention in place ?-CXR w/ large R pleural effusion: will perform thoracentesis and send off pleural studies ?-repeat CXR post thoracentesis and in am ?-will give 40 IV lasix ? ?Spontaneous Cerebellar hematoma, s/p evacuation on 3/18  ?Cerebral Edema ?Obstructive hydrocephalus ?P: ?-Nsg and neuro following: appreciate recs ?-frequent neuro checks ?-limit sedating meds ?-continue hypertonic for  Na goal 150-155; frequent neuro checks ?-SBP goal 105-160 per neuro; off pressors today; prn  labetalol ?-cont suboccipital drain per neuro ?-continue keppra ?-PT/OT/SLP ? ?Moderate aortic valve stenosis, moderate MR/TR ?Hypertension ?Hyperlipidemia ?Chronic atrial fibrillation on eliquis ?Echo 3/19>> EF 55-60%, LV normal function, moderate LV hypertrophy, RV Normal function, Moderately elevated PASP, LA severely dilated, Small pericardial effusion , No tamponade, Mild MV regurg, moderate calcification of the aortic valve. Mild aortic valve stenosis. IVC dilated in size RAP about 15 mm HG ?P: ?-continuous telemetry ?-IV lasix ?-continue to hold eliquis; resume per neuro ?-prn labetalol for SBP goal above ? ?DM2 ?P: ?-ssi and cbg monitoring ? ?Myelodysplasia, received 1 U PRBC in OR 3/18 ?P: ?-onc f/u outpatient ?-trend cbc ? ?AKI> new 3/19, oliguric ?615 cc UO last 24 hours ?P: ?-IV lasix today ?-Trend BMP / urinary output ?-Replace electrolytes as indicated ?-Avoid nephrotoxic agents, ensure adequate renal perfusion ? ? ?Best Practice (right click and "Reselect all SmartList Selections" daily)  ? ?Diet/type: tubefeeds ?DVT prophylaxis: SCD ?GI prophylaxis: N/A ?Lines: Central line and yes and it is still needed ?Foley:  Yes, and it is still needed ?Code Status:  full code ?Last date of multidisciplinary goals of care discussion [per primary service; 3/21 spoke w/ wife at bedside and updated] ? ?Critical care time: 35 minutes ?  ? ?Mikki Harbor, PA-C ?[facility] ?02/13/2022, 8:44 AM ? ?Please see [email or web address] for pager details. ? ?From 7A-7P if no response, please call [phone]. ?After hours, please call Warren Lacy [phone]. ? ? ? ? ?

## 2022-02-13 NOTE — Progress Notes (Signed)
Patient ID: Vernon [name], male   DOB: January 16, 1946, 76 y.o.   MRN: [id number] ?Vital signs are improving as patient is weaned from his meds.  He will follow some commands when sedation is lightened.  Still needs some respiratory support but ventilatory trials are going on per hospitalist care. ?Neurologically the patient appears to be stable. ? ?

## 2022-02-13 NOTE — TOC CAGE-AID Note (Signed)
Transition of Care (TOC) - CAGE-AID Screening ? ? ?Patient Details  ?Name: Vernon [name] ?MRN: [id number] ?Date of Birth: 28-Feb-1946 ? ?Transition of Care (TOC) CM/SW Contact:    ?Yarielys Beed C Tarpley-Carter, LCSWA ?Phone Number: ?02/13/2022, 1:24 PM ? ? ?Clinical Narrative: ?Pt is unable to participate in Cage Aid. ?CSW will assess at a better time. ? ?Passenger transport manager, MSW, LCSW-A ?Pronouns:  She/Her/Hers ?[facility] ?Clinical Social Worker ?Direct Number:  [phone] ?[email or web address] ? ?CAGE-AID Screening: ?Substance Abuse Screening unable to be completed due to: : Patient unable to participate ? ?  ?  ?  ?  ?  ? ?Substance Abuse Education Offered: No ? ?  ? ? ? ? ? ? ?

## 2022-02-13 NOTE — Progress Notes (Signed)
Patient's vent alarming, entered room and patient's head was thrashing side to side. Noticed that patients hemovac drain had become dislodged and was no longer under the dressing. Patient's dressing is dry and intact with old drainage noted. On call neurosurgery paged and told to continue to monitor neuro status.  ?

## 2022-02-13 NOTE — Progress Notes (Signed)
Pharmacy Antibiotic Note ? ?Vernon [name] is a 76 y.o. male admitted on 02/10/2022 with L cerebellar peduncle ICH, now with concern for aspiration pneumonia. Pharmacy has been consulted for Unasyn dosing. Bedside chest ultrasound showing small L sided pleural effusion with lung atelectasis and moderate to large R sided pleural effusion. Thoracentesis performed 3/21, with 1250 cc's of dark yellow fluid drained from right pleural space. ? ?Plan: ?Unasyn 3g IV q8h  ?Follow pleural fluid Cx and de-escalate as appropriate ?Monitor renal function daily  ?Follow up duration of therapy  ? ?Height: '5\' 9"'$  (175.3 cm) ?Weight: 102.6 kg (226 lb 3.1 oz) ?IBW/kg (Calculated) : 70.7 ? ?Temp (24hrs), Avg:99.4 ?F (37.4 ?C), Min:99 ?F (37.2 ?C), Max:100.6 ?F (38.1 ?C) ? ?Recent Labs  ?Lab 02/10/22 ?1553 02/11/22 ?9758 02/11/22 ?1629 02/12/22 ?8325 02/12/22 ?4982  ?WBC 8.2 9.6  --  10.6* 11.0*  ?CREATININE 1.24 1.62* 1.73* 1.86* 1.83*  ?  ?Estimated Creatinine Clearance: 41.2 mL/min (A) (by C-G formula based on SCr of 1.83 mg/dL (H)).   ? ?No Known Allergies ? ?Antimicrobials this admission: ?N/A ? ?Microbiology results: ?3/21 Pleural fluid Cx: in process  ?3/18 MRSA PCR: negative ? ?Thank you for allowing pharmacy to be a part of this patient?s care. ? ?Eliseo Gum, PharmD Candidate  ?02/13/2022 11:15 AM  ? ?

## 2022-02-14 ENCOUNTER — Inpatient Hospital Stay (HOSPITAL_COMMUNITY): Payer: Medicare Other

## 2022-02-14 DIAGNOSIS — J9601 Acute respiratory failure with hypoxia: Secondary | ICD-10-CM | POA: Diagnosis not present

## 2022-02-14 DIAGNOSIS — J90 Pleural effusion, not elsewhere classified: Secondary | ICD-10-CM

## 2022-02-14 DIAGNOSIS — E1165 Type 2 diabetes mellitus with hyperglycemia: Secondary | ICD-10-CM

## 2022-02-14 DIAGNOSIS — G936 Cerebral edema: Secondary | ICD-10-CM | POA: Diagnosis not present

## 2022-02-14 DIAGNOSIS — J9690 Respiratory failure, unspecified, unspecified whether with hypoxia or hypercapnia: Secondary | ICD-10-CM

## 2022-02-14 DIAGNOSIS — I614 Nontraumatic intracerebral hemorrhage in cerebellum: Secondary | ICD-10-CM | POA: Diagnosis not present

## 2022-02-14 DIAGNOSIS — I482 Chronic atrial fibrillation, unspecified: Secondary | ICD-10-CM

## 2022-02-14 LAB — CBC
HCT: 26.9 % — ABNORMAL LOW (ref 39.0–52.0)
Hemoglobin: 8 g/dL — ABNORMAL LOW (ref 13.0–17.0)
MCH: 29.7 pg (ref 26.0–34.0)
MCHC: 29.7 g/dL — ABNORMAL LOW (ref 30.0–36.0)
MCV: 100 fL (ref 80.0–100.0)
Platelets: 220 10*3/uL (ref 150–400)
RBC: 2.69 MIL/uL — ABNORMAL LOW (ref 4.22–5.81)
RDW: 29.7 % — ABNORMAL HIGH (ref 11.5–15.5)
WBC: 8.4 10*3/uL (ref 4.0–10.5)
nRBC: 0.2 % (ref 0.0–0.2)

## 2022-02-14 LAB — BASIC METABOLIC PANEL
BUN: 46 mg/dL — ABNORMAL HIGH (ref 8–23)
CO2: 19 mmol/L — ABNORMAL LOW (ref 22–32)
Calcium: 8.3 mg/dL — ABNORMAL LOW (ref 8.9–10.3)
Chloride: >130 mmol/L (ref 98–111)
Creatinine, Ser: 1.99 mg/dL — ABNORMAL HIGH (ref 0.61–1.24)
GFR, Estimated: 34 mL/min — ABNORMAL LOW (ref >60)
Glucose, Bld: 237 mg/dL — ABNORMAL HIGH (ref 70–99)
Potassium: 4.5 mmol/L (ref 3.5–5.1)
Sodium: 158 mmol/L — ABNORMAL HIGH (ref 135–145)

## 2022-02-14 LAB — GLUCOSE, CAPILLARY
Glucose-Capillary: 144 mg/dL — ABNORMAL HIGH (ref 70–99)
Glucose-Capillary: 184 mg/dL — ABNORMAL HIGH (ref 70–99)
Glucose-Capillary: 189 mg/dL — ABNORMAL HIGH (ref 70–99)
Glucose-Capillary: 191 mg/dL — ABNORMAL HIGH (ref 70–99)
Glucose-Capillary: 203 mg/dL — ABNORMAL HIGH (ref 70–99)
Glucose-Capillary: 206 mg/dL — ABNORMAL HIGH (ref 70–99)

## 2022-02-14 LAB — BPAM RBC
Blood Product Expiration Date: 202304112359
Blood Product Expiration Date: 202304112359
ISSUE DATE / TIME: 202303182339
ISSUE DATE / TIME: 202303182339
Unit Type and Rh: 5100
Unit Type and Rh: 5100

## 2022-02-14 LAB — TYPE AND SCREEN
ABO/RH(D): O POS
Antibody Screen: NEGATIVE
Unit division: 0
Unit division: 0

## 2022-02-14 LAB — MAGNESIUM: Magnesium: 2.1 mg/dL (ref 1.7–2.4)

## 2022-02-14 LAB — SODIUM
Sodium: 162 mmol/L (ref 135–145)
Sodium: 162 mmol/L (ref 135–145)
Sodium: 162 mmol/L (ref 135–145)
Sodium: 160 mmol/L — ABNORMAL HIGH (ref 135–145)

## 2022-02-14 LAB — TRIGLYCERIDES: Triglycerides: 46 mg/dL (ref <150)

## 2022-02-14 LAB — CYTOLOGY - NON PAP

## 2022-02-14 MED ORDER — SODIUM CHLORIDE 3 % IN NEBU: 4.0000 mL | INHALATION_SOLUTION | Freq: Four times a day (QID) | RESPIRATORY_TRACT | Status: DC

## 2022-02-14 MED ORDER — FUROSEMIDE 10 MG/ML IJ SOLN
40.0000 mg | Freq: Once | INTRAMUSCULAR | Status: AC
  Administered 2022-02-14: 40 mg via INTRAVENOUS
  Filled 2022-02-14: qty 4

## 2022-02-14 MED ORDER — ONDANSETRON HCL 4 MG PO TABS: 4.0000 mg | ORAL_TABLET | ORAL | Status: DC | PRN

## 2022-02-14 MED ORDER — FREE WATER
200.0000 mL | Status: DC
  Administered 2022-02-14 – 2022-02-15 (×4): 200 mL

## 2022-02-14 MED ORDER — POLYETHYLENE GLYCOL 3350 17 G PO PACK
17.0000 g | PACK | Freq: Two times a day (BID) | ORAL | Status: DC
  Administered 2022-02-14: 17 g
  Filled 2022-02-14: qty 1

## 2022-02-14 MED ORDER — QUETIAPINE FUMARATE 25 MG PO TABS
50.0000 mg | ORAL_TABLET | Freq: Two times a day (BID) | ORAL | Status: DC
  Administered 2022-02-14: 50 mg
  Filled 2022-02-14: qty 2

## 2022-02-14 MED ORDER — POLYETHYLENE GLYCOL 3350 17 G PO PACK
17.0000 g | PACK | Freq: Every day | ORAL | Status: DC | PRN
Start: 2022-02-14 — End: 2022-02-16

## 2022-02-14 MED ORDER — ALBUTEROL SULFATE (2.5 MG/3ML) 0.083% IN NEBU
2.5000 mg | INHALATION_SOLUTION | Freq: Four times a day (QID) | RESPIRATORY_TRACT | Status: DC
Start: 2022-02-14 — End: 2022-02-14

## 2022-02-14 MED ORDER — PANTOPRAZOLE SODIUM 40 MG PO TBEC
40.0000 mg | DELAYED_RELEASE_TABLET | Freq: Every day | ORAL | Status: DC
  Filled 2022-02-14: qty 1

## 2022-02-14 MED ORDER — GUAIFENESIN 100 MG/5ML PO LIQD
10.0000 mL | Freq: Four times a day (QID) | ORAL | Status: DC
  Administered 2022-02-14: 10 mL

## 2022-02-14 MED ORDER — QUETIAPINE FUMARATE 25 MG PO TABS
50.0000 mg | ORAL_TABLET | Freq: Two times a day (BID) | ORAL | Status: DC
  Administered 2022-02-14 – 2022-02-17 (×7): 50 mg
  Filled 2022-02-14 (×6): qty 2

## 2022-02-14 MED ORDER — GUAIFENESIN 100 MG/5ML PO LIQD
10.0000 mL | Freq: Four times a day (QID) | ORAL | Status: DC
  Administered 2022-02-14 – 2022-02-18 (×15): 10 mL
  Filled 2022-02-14 (×14): qty 15

## 2022-02-14 MED ORDER — QUETIAPINE FUMARATE 25 MG PO TABS
50.0000 mg | ORAL_TABLET | Freq: Two times a day (BID) | ORAL | Status: DC
  Filled 2022-02-14: qty 2

## 2022-02-14 MED ORDER — POLYETHYLENE GLYCOL 3350 17 G PO PACK
17.0000 g | PACK | Freq: Two times a day (BID) | ORAL | Status: DC
  Administered 2022-02-14: 17 g

## 2022-02-14 MED ORDER — GUAIFENESIN 200 MG PO TABS: 200.0000 mg | ORAL_TABLET | Freq: Four times a day (QID) | ORAL | Status: DC

## 2022-02-14 MED ORDER — FREE WATER
200.0000 mL | Status: AC
  Administered 2022-02-14 (×2): 200 mL

## 2022-02-14 MED ORDER — GUAIFENESIN 100 MG/5ML PO LIQD
10.0000 mL | Freq: Four times a day (QID) | ORAL | Status: DC
  Filled 2022-02-14: qty 15

## 2022-02-14 MED ORDER — ALBUTEROL SULFATE (2.5 MG/3ML) 0.083% IN NEBU
2.5000 mg | INHALATION_SOLUTION | Freq: Four times a day (QID) | RESPIRATORY_TRACT | Status: DC
  Administered 2022-02-14 – 2022-02-17 (×12): 2.5 mg via RESPIRATORY_TRACT
  Filled 2022-02-14 (×12): qty 3

## 2022-02-14 MED ORDER — POLYETHYLENE GLYCOL 3350 17 G PO PACK
17.0000 g | PACK | Freq: Two times a day (BID) | ORAL | Status: DC
  Filled 2022-02-14: qty 1

## 2022-02-14 MED ORDER — ONDANSETRON HCL 4 MG/2ML IJ SOLN: 4.0000 mg | INTRAMUSCULAR | Status: DC | PRN

## 2022-02-14 MED ORDER — INSULIN ASPART 100 UNIT/ML IJ SOLN
3.0000 [IU] | INTRAMUSCULAR | Status: DC
  Administered 2022-02-14 (×2): 3 [IU] via SUBCUTANEOUS

## 2022-02-14 MED ORDER — PROMETHAZINE HCL 12.5 MG PO TABS
12.5000 mg | ORAL_TABLET | ORAL | Status: DC | PRN
  Filled 2022-02-14: qty 2

## 2022-02-14 MED ORDER — SODIUM CHLORIDE 3 % IN NEBU
4.0000 mL | INHALATION_SOLUTION | Freq: Four times a day (QID) | RESPIRATORY_TRACT | Status: AC
  Administered 2022-02-14 – 2022-02-17 (×11): 4 mL via RESPIRATORY_TRACT
  Filled 2022-02-14 (×11): qty 4

## 2022-02-14 NOTE — Progress Notes (Addendum)
OT impression: Pt seen on 8L HFNC, VSS throughout. Pt was evaluated s/p the below admission list, per report from his son, Vernon [name], pt is indep at baseline without AD and drives. He lives at home with his wife who still works full time, and has family near by who can assist as needed. Upon evaluation pt required total A +2 for all aspects of his care and bed mobility. Heavy assist levels due to level of arousal which did not improve with dangling at the EOB. Pt followed <25% of commands and kept his eyes closed for the majority of the session. Ot to follow acutely to continue to further assess pt. Due to indep baseline, recommend intensive therapies at the CIR level pending pt's progress acutely.  ? ? ? 02/14/22 1600  ?OT Visit Information  ?Assistance Needed +2  ?Reason for Co-Treatment Complexity of the patient's impairments (multi-system involvement);Necessary to address cognition/behavior during functional activity;For patient/therapist safety  ?PT goals addressed during session Mobility/safety with mobility;Balance;Strengthening/ROM  ?History of Present Illness Pt. is a 76 y.o. male presenting to [facility] on 3/18 with worseing L side coordination along with N/V. CTH demonstrated a 2,2 cm L cerebellar peduncle ICH. He had an epsidoe fo emisis and developed hypoxemia, requiring intubation as well as undergoing an emergent craniotomy on 3/18; hematoma evacuation and drain placement. PMH significant for anemia, arthritis, GERD, DM2, GERD, HTN, afibb on Eliquis.  ?Precautions  ?Precautions Fall  ?Restrictions  ?Weight Bearing Restrictions No  ?Home Living  ?Family/patient expects to be discharged to: Private residence ?  ?Living Arrangements Spouse/significant other  ?Available Help at Discharge Family;Available 24 hours/day  ?Type of Home House  ?Home Access Stairs to enter  ?Entrance Stairs-Number of Steps 5  ?Entrance Stairs-Rails Left;Right  ?Home Layout Multi-level;Able to live on main level with bedroom/bathroom   ?Bathroom Shower/Tub Walk-in shower  ?Bathroom Toilet Standard  ?Home Equipment None  ?Additional Comments supportive family, wife wroks full time ?  ?Prior Function  ?Prior Level of Function  Independent/Modified Independent;Working/employed  ?Mobility Comments no AD  ?ADLs Comments drives, retired  ?Communication  ?Communication Expressive difficulties ?  ?Pain Assessment  ?Pain Assessment Faces ?  ?Faces Pain Scale 0  ?Facial Expression 0 ?  ?Body Movements 0 ?  ?Muscle Tension 0 ?  ?Compliance with ventilator (intubated pts.) N/A ?  ?Vocalization (extubated pts.) N/A ?  ?CPOT Total 0 ?  ?Pain Intervention(s) Monitored during session  ?Cognition  ?Arousal/Alertness Lethargic ?  ?Behavior During Therapy Flat affect ?  ?Overall Cognitive Status Difficult to assess ?  ?Area of Impairment Following commands  ?Following Commands Follows one step commands inconsistently  ?General Comments eyes closed 75% of the session followed <25% of commands ?  ?Difficult to assess due to Level of arousal ?  ?Upper Extremity Assessment  ?Upper Extremity Assessment Difficult to assess due to impaired cognition ?(pt gave R thumbs up, with minimal spontaneous movements of BUEs   ?Lower Extremity Assessment  ?Lower Extremity Assessment Defer to PT evaluation ?  ?RLE Deficits / Details kicks RLE against gravity, wiggles toes, bends knee in gravits neutral position, otherwise difficult to formally assess strength due to lethargy and impaired command following  ?LLE Deficits / Details wiggles toes, extends L knee in gravity-eliminated position, difficult to assess further  ?Cervical / Trunk Assessment  ?Cervical / Trunk Assessment Other exceptions ?  ?Cervical / Trunk Exceptions crani  ?Vision- Assessment  ?Vision Assessment? Vision impaired- to be further tested in functional context  ?Additional Comments disconjugate gaze  noted. not tracking stimulus, maintained R gaze  ?Perception  ?Perception Tested? No  ?Praxis  ?Praxis tested? Not  tested  ?ADL  ?Overall ADL's  Needs assistance/impaired  ?Eating/Feeding NPO  ?Functional mobility during ADLs Total assistance;+2 for physical assistance;+2 for safety/equipment  ?General ADL Comments pt is total A for all aspects of care at bed level due to level of arousal  ?Bed Mobility  ?Overal bed mobility Needs Assistance ?  ?Bed Mobility Supine to Sit;Sit to Supine ?  ?Supine to sit Total assist;+2 for physical assistance;+2 for safety/equipment ?  ?Sit to supine Total assist;+2 for safety/equipment;+2 for physical assistance ?  ?Transfers  ?Overall transfer level Needs assistance  ?General transfer comment unable to attempt  ?Balance  ?Overall balance assessment Needs assistance  ?Sitting-balance support No upper extremity supported;Feet supported  ?Sitting balance-Leahy Scale Poor  ?Sitting balance - Comments totalA initially, pt demonstrates some righting later in session but remains max-total  ?Postural control Posterior lean  ?General Comments  ?General comments (skin integrity, edema, etc.) no adverse reactions to therapy this date ?  ?OT - End of Session  ?Equipment Utilized During Treatment Oxygen  ?Activity Tolerance Patient tolerated treatment well;Patient limited by lethargy  ?Patient left in bed;with call bell/phone within reach;with bed alarm set  ?Nurse Communication Mobility status  ?OT Assessment  ?OT Recommendation/Assessment Patient needs continued OT Services  ?OT Visit Diagnosis Repeated falls (R29.6);Other abnormalities of gait and mobility (R26.89);Unsteadiness on feet (R26.81);Muscle weakness (generalized) (M62.81);Pain;Other (comment)  ?OT Problem List Decreased strength;Decreased range of motion;Decreased activity tolerance;Impaired balance (sitting and/or standing);Decreased coordination;Impaired vision/perception;Decreased cognition;Decreased safety awareness;Impaired UE functional use  ?OT Plan  ?OT Frequency (ACUTE ONLY) Min 2X/week  ?OT Treatment/Interventions (ACUTE ONLY)  Self-care/ADL training;Therapeutic exercise;Balance training;Patient/family education;Therapeutic activities;DME and/or AE instruction  ?AM-PAC OT "6 Clicks" Daily Activity Outcome Measure (Version 2)  ?Help from another person eating meals? 1  ?Help from another person taking care of personal grooming? 1  ?Help from another person toileting, which includes using toliet, bedpan, or urinal? 1  ?Help from another person bathing (including washing, rinsing, drying)? 1  ?Help from another person to put on and taking off regular upper body clothing? 1  ?Help from another person to put on and taking off regular lower body clothing? 1  ?6 Click Score 6  ?Progressive Mobility  ?What is the highest level of mobility based on the progressive mobility assessment? Level 1 (Bedfast) - Unable to balance while sitting on edge of bed ?  ?Activity Dangled on edge of bed ?  ?OT Recommendation  ?Recommendations for Other Services Rehab consult  ?Follow Up Recommendations Acute inpatient rehab (3hours/day)  ?Assistance recommended at discharge Frequent or constant Supervision/Assistance  ?Patient can return home with the following Two people to help with walking and/or transfers;Two people to help with bathing/dressing/bathroom;Assistance with cooking/housework;Direct supervision/assist for medications management;Direct supervision/assist for financial management;Assist for transportation;Help with stairs or ramp for entrance  ?Functional Status Assessent Patient has had a recent decline in their functional status and demonstrates the ability to make significant improvements in function in a reasonable and predictable amount of time.  ?OT Equipment Other (comment) ?(will further assess needs)  ?Individuals Consulted  ?Consulted and Agree with Results and Recommendations Patient  ?Acute Rehab OT Goals  ?Patient Stated Goal unable to state  ?OT Goal Formulation Patient unable to participate in goal setting  ?Time For Goal Achievement  02/28/22  ?Potential to Achieve Goals Good  ?OT Time Calculation  ?OT Start Time (ACUTE ONLY) 1537  ?  OT Stop Time (ACUTE ONLY) 1600  ?OT Time Calculation (min) 23 min  ?OT General Charges  ?$OT Visit 1 Visit  ?OT E

## 2022-02-14 NOTE — Progress Notes (Signed)
Date and time results received: 02/14/22 0125 ? ?Test: sodium ?Critical Value: 162 ? ?Name of Provider Notified: Elink ? ?Orders Received? Or Actions Taken?: no new orders at this time ?

## 2022-02-14 NOTE — Procedures (Signed)
Extubation Procedure Note ? ?Patient Details:   ?Name: Vernon [name] ?DOB: 21-Feb-1946 ?MRN: [id number] ?  ?Airway Documentation:  ?  ?Vent end date: 02/14/22 Vent end time: 1005  ? ?Evaluation ? O2 sats: stable throughout ?Complications: No apparent complications ?Patient did tolerate procedure well. ?Bilateral Breath Sounds: Clear, Diminished ?  ?No ? ?RT extubated patient to 10L [place] per MD order with PA and RN at bedside. Positive cuff leak noted. Patient tolerated well. Patient sating 98% on 10L. No stridor noted at this time. RT will continue to monitor as needed.  ? ? ?Fabiola Backer ?02/14/2022, 10:13 AM ? ?

## 2022-02-14 NOTE — Progress Notes (Signed)
   Inpatient Rehab Admissions Coordinator :  Per therapy recommendations patient was screened for CIR candidacy by Jayanna Kroeger RN MSN. Patient is not yet at a level to tolerate the intensity required to pursue a CIR admit . Patient may have the potential to progress to become a candidate. The CIR admissions team will follow and monitor for progress and place a Rehab Consult order if felt to be appropriate. Please contact me with any questions.  Asheton Scheffler RN MSN Admissions Coordinator [phone]  

## 2022-02-14 NOTE — Progress Notes (Signed)
Date and time results received: 02/14/22 0900 ?(use smartphrase ".now" to insert current time) ? ?Test: Na ?Critical Value: 162 ? ?Name of Provider Notified: Chand ? ?Orders Received? Or Actions Taken?: Actions Taken: no new orders ?

## 2022-02-14 NOTE — Progress Notes (Signed)
SLP Cancellation Note ? ?Patient Details ?Name: Vernon [name] ?MRN: [id number] ?DOB: 18-Mar-1946 ? ? ?Cancelled treatment:       Reason Eval/Treat Not Completed: Patient not medically ready, remains intubated. Will sign off at this time.  ? ? ?Deliyah Muckle, Katherene Ponto ?02/14/2022, 7:57 AM ?

## 2022-02-14 NOTE — Progress Notes (Signed)
[facility] Swallow Screen not completed due to patient's inability to follow directions at this time. ?

## 2022-02-14 NOTE — Evaluation (Signed)
Physical Therapy Evaluation Patient Details Name: Vernon [name] MRN: [id number] DOB: 04-08-1946 Today's Date: 02/14/2022  History of Present Illness  Pt. is a 76 y.o. male presenting to [facility] on 3/18 with worseing L side coordination along with N/V. CTH demonstrated a 2,2 cm L cerebellar peduncle ICH. He had an epsidoe fo emisis and developed hypoxemia, requiring intubation as well as undergoing an emergent craniotomy on 3/18; hematoma evacuation and drain placement. PMH significant for anemia, arthritis, GERD, DM2, GERD, HTN, afibb on Eliquis.  Clinical Impression  Pt presents to PT with deficits in arousal, functional mobility, balance, strength, power, endurance, communication and cognition. Pt is lethargic throughout eval, with poor command following at this time. Pt moves all extremities during session but demonstrates significant weakness and requires +2 physical assistance for all mobility activities. Pt will benefit from continued acute PT services in an effort to reduce caregiver burden and improve mobility quality.     Recommendations for follow up therapy are one component of a multi-disciplinary discharge planning process, led by the attending physician.  Recommendations may be updated based on patient status, additional functional criteria and insurance authorization.  Follow Up Recommendations Acute inpatient rehab (3hours/day) (will need to demonstrate improved arousal and ability to follow commands)    Assistance Recommended at Discharge Frequent or constant Supervision/Assistance  Patient can return home with the following  Two people to help with walking and/or transfers;Two people to help with bathing/dressing/bathroom;Assistance with cooking/housework;Assistance with feeding;Direct supervision/assist for financial management;Direct supervision/assist for medications management;Assist for transportation;Help with stairs or ramp for entrance    Equipment Recommendations Wheelchair  (measurements PT);Wheelchair cushion (measurements PT);Hospital bed;Other (comment) (hoyer lift)  Recommendations for Other Services  Rehab consult    Functional Status Assessment Patient has had a recent decline in their functional status and demonstrates the ability to make significant improvements in function in a reasonable and predictable amount of time.     Precautions / Restrictions Precautions Precautions: Fall Restrictions Weight Bearing Restrictions: No      Mobility  Bed Mobility                    Transfers                        Ambulation/Gait                  Stairs            Wheelchair Mobility    Modified Rankin (Stroke Patients Only) Modified Rankin (Stroke Patients Only) Pre-Morbid Rankin Score:  (unsure) Modified Rankin: Severe disability     Balance Overall balance assessment: Needs assistance Sitting-balance support: No upper extremity supported, Feet supported Sitting balance-Leahy Scale: Poor Sitting balance - Comments: totalA initially, pt demonstrates some righting later in session but remains max-total Postural control: Posterior lean                                   Pertinent Vitals/Pain Pain Assessment Facial Expression: Relaxed, neutral (Simultaneous filing. User may not have seen previous data.) Body Movements: Absence of movements (Simultaneous filing. User may not have seen previous data.) Muscle Tension: Relaxed (Simultaneous filing. User may not have seen previous data.) Compliance with ventilator (intubated pts.): N/A (Simultaneous filing. User may not have seen previous data.) Vocalization (extubated pts.): N/A (Simultaneous filing. User may not have seen previous data.) CPOT Total: 0 (Simultaneous filing. User  may not have seen previous data.)    Home Living Family/patient expects to be discharged to:: Private residence (Simultaneous filing. User may not have seen previous  data.) Living Arrangements: Spouse/significant other Available Help at Discharge: Family;Available 24 hours/day Type of Home: House Home Access: Stairs to enter Entrance Stairs-Rails: Lawyer of Steps: 5   Home Layout: Multi-level;Able to live on main level with bedroom/bathroom Home Equipment: None Additional Comments: supportive family, wife wroks full time (Simultaneous filing. User may not have seen previous data.)    Prior Function Prior Level of Function : Independent/Modified Independent;Working/employed             Mobility Comments: no AD ADLs Comments: drives, retired     Higher education careers adviser        Extremity/Trunk Assessment   Upper Extremity Assessment Upper Extremity Assessment: Difficult to assess due to impaired cognition (pt gave R thumbs up, with minimal spontaneous movements of BUEs  Simultaneous filing. User may not have seen previous data.)    Lower Extremity Assessment Lower Extremity Assessment: Defer to PT evaluation (Simultaneous filing. User may not have seen previous data.) RLE Deficits / Details: kicks RLE against gravity, wiggles toes, bends knee in gravits neutral position, otherwise difficult to formally assess strength due to lethargy and impaired command following LLE Deficits / Details: wiggles toes, extends L knee in gravity-eliminated position, difficult to assess further    Cervical / Trunk Assessment Cervical / Trunk Assessment: Other exceptions (Simultaneous filing. User may not have seen previous data.) Cervical / Trunk Exceptions: crani  Communication   Communication: Expressive difficulties (Simultaneous filing. User may not have seen previous data.)  Cognition       Area of Impairment: Following commands                       Following Commands: Follows one step commands inconsistently                General Comments General comments (skin integrity, edema, etc.): no adverse reactions to  therapy this date (Simultaneous filing. User may not have seen previous data.)    Exercises     Assessment/Plan    PT Assessment Patient needs continued PT services  PT Problem List Decreased strength;Decreased activity tolerance;Decreased balance;Decreased mobility;Decreased cognition;Decreased knowledge of use of DME;Decreased safety awareness;Decreased knowledge of precautions       PT Treatment Interventions DME instruction;Gait training;Functional mobility training;Therapeutic activities;Stair training;Therapeutic exercise;Balance training;Neuromuscular re-education;Patient/family education;Cognitive remediation;Wheelchair mobility training    PT Goals (Current goals can be found in the Care Plan section)  Acute Rehab PT Goals Patient Stated Goal: PT goal to improve functional mobility quality in an effort to reduce caregiver burden. Pt unable to state goal PT Goal Formulation: Patient unable to participate in goal setting Time For Goal Achievement: 02/28/22 Potential to Achieve Goals: Fair    Frequency Min 3X/week     Co-evaluation PT/OT/SLP Co-Evaluation/Treatment: Yes Reason for Co-Treatment: Complexity of the patient's impairments (multi-system involvement);Necessary to address cognition/behavior during functional activity;For patient/therapist safety PT goals addressed during session: Mobility/safety with mobility;Balance;Strengthening/ROM         AM-PAC PT "6 Clicks" Mobility  Outcome Measure Help needed turning from your back to your side while in a flat bed without using bedrails?: Total Help needed moving from lying on your back to sitting on the side of a flat bed without using bedrails?: Total Help needed moving to and from a bed to a chair (including a wheelchair)?: Total Help  needed standing up from a chair using your arms (e.g., wheelchair or bedside chair)?: Total Help needed to walk in hospital room?: Total Help needed climbing 3-5 steps with a railing? :  Total 6 Click Score: 6    End of Session Equipment Utilized During Treatment: Oxygen Activity Tolerance: Patient limited by lethargy Patient left: in bed;with call bell/phone within reach;with bed alarm set;with restraints reapplied (donned mit) Nurse Communication: Mobility status;Need for lift equipment PT Visit Diagnosis: Other abnormalities of gait and mobility (R26.89);Muscle weakness (generalized) (M62.81);Other symptoms and signs involving the nervous system (R29.898)    Time: 1537-1600 PT Time Calculation (min) (ACUTE ONLY): 23 min   Charges:   PT Evaluation $PT Eval Moderate Complexity: 1 Mod          Arlyss Gandy, PT, DPT Acute Rehabilitation Pager: [phone] Office [phone]   Arlyss Gandy 02/14/2022, 4:43 PM

## 2022-02-14 NOTE — TOC CAGE-AID Note (Signed)
Transition of Care (TOC) - CAGE-AID Screening ? ? ?Patient Details  ?Name: Vernon [name] ?MRN: [id number] ?Date of Birth: 02/01/1946 ? ?Transition of Care (TOC) CM/SW Contact:    ?Kaevion Sinclair C Tarpley-Carter, LCSWA ?Phone Number: ?02/14/2022, 1:28 PM ? ? ?Clinical Narrative: ?Pt is unable to participate in Cage Aid.  CSW will assess at a better time.   ? ?Passenger transport manager, MSW, LCSW-A ?Pronouns:  She/Her/Hers ?[facility]itions of Care ?Clinical Social Worker ?Direct Number:  [phone] ?[email or web address] ? ?CAGE-AID Screening: ?Substance Abuse Screening unable to be completed due to: : Patient unable to participate ? ?  ?  ?  ?  ?  ? ?Substance Abuse Education Offered: No ? ?  ? ? ? ? ? ? ?

## 2022-02-14 NOTE — Progress Notes (Signed)
eLink Physician-Brief Progress Note ?Patient Name: Vernon [name] ?DOB: [birth year] ?MRN: [id number] ? ? ?Date of Service ? 02/14/2022  ?HPI/Events of Note ? Oliguria - Na+ = 162 d/t 3% NaCl administration which is now stopped. Very hard to give him Lasix emperically d/t hypernatremia.   ?eICU Interventions ? Plan: ?Monitor CVP now and Q 4 hours.   ? ? ? ?Intervention Category ?Major Interventions: Other: ? ?[name] ?02/14/2022, 4:44 AM ?

## 2022-02-14 NOTE — Progress Notes (Signed)
eLink Physician-Brief Progress Note ?Patient Name: Vernon [name] ?DOB: 27-Nov-1945 ?MRN: [id number] ? ? ?Date of Service ? 02/14/2022  ?HPI/Events of Note ? Oliguria - CVP = 16 and last Na+ = 162. Last Creatinine = 2.03. Difficult situation as loop diuretic may make Na+ higher.   ?eICU Interventions ? Plan: ?Lasix 40 mg IV X 1 now. ?Free water 200 mL per tube now and Q 4 hours X 2 doses.   ? ? ? ?Intervention Category ?Major Interventions: Other:;Electrolyte abnormality - evaluation and management ? ?[name] ?02/14/2022, 5:30 AM ?

## 2022-02-14 NOTE — Progress Notes (Addendum)
? ?NAME:  PROSPER PAFF, MRN:  [id number], DOB:  [birth year], LOS: 4 ?ADMISSION DATE:  02/10/2022, CONSULTATION DATE:  3/18 ?REFERRING MD:  Sarita Haver, CHIEF COMPLAINT:  headache, weakness, nausea  ? ?History of Present Illness:  ?76 y/o male went to [facility] on 3/18 with weakness and nausea, found to have a cerebellar hemorrhage.  Repeat head CT showed expansion. He vomited and developed hypoxemia requiring intubation for airway protection.  He went for an emergent craniotomy on 3/18, hematoma evacuation and drain placement. PCCM consulted for ventilator management.  ? ?Pertinent  Medical History  ?Hypertension ?Anemia ?GERD ?DM2 ?Hiatal hernia ?Atrial fibrillation on eliquis ?Aortic stenosis ?Hypertension ?myelodysplasia ? ?Significant Hospital Events: ?Including procedures, antibiotic start and stop dates in addition to other pertinent events   ?3/18 admission, intubation for airway protection after vomiting, underwent Suboccipital craniectomy and evacuation of cerebellar hemorrhage.  Placement of right parieto-occipital ventriculostomy by Dr. Ellene Route ?3/19 repeat CT head > s/p left suboccipital craniectomy and cerebellar hematoma evacuation with moderate residual volume of posterior fossa blood, improved patency of 4th ventricle, decreased lateral and 3rd ventricle size; EVD in place, otherwise stable CT ?3/20  MRI Brain>>Left suboccipital craniectomy for hematoma evacuation. Roughly 13 mL residual left cerebellar blood tracking across midline to the right. Cerebellar edema with partial effacement of the 4th ventricle. But basilar cistern patency is adequate, with no ventriculomegaly. Sequelae of attempted EVD placement across the splenium of the corpus callosum. Trace intraventricular and subarachnoid hemorrhage. Background mild to moderate nonspecific cerebral white matter signal changes. 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 ?3/21: patient on PSV; possibly extubate  today; thoracentesis for R pleural effusion; off pressors; sedation switched to cleviprex; hypertonic saline stopped due to hypernatremia ?3/22: pulled EVD out; NSG aware and will just continue to monitor ? ?  ?Interim History / Subjective:  ?On mech vent +10 and 70% fio2; sats 95% ?Sedation switched to precedex ?According to nurse patient wiggled toes to command ?EVD pulled out yesterday; nsg recommend continue to monitor ? ?Objective   ?Blood pressure (!) 128/58, pulse (!) 52, temperature 99.1 ?F (37.3 ?C), temperature source Axillary, resp. rate 20, height '5\' 9"'$  (6.283 m), weight 100.5 kg, SpO2 99 %. ?CVP:  [16 mmHg-17 mmHg] 17 mmHg  ?Vent Mode: PRVC ?FiO2 (%):  [40 %-70 %] 70 % ?Set Rate:  [16 bmp] 16 bmp ?Vt Set:  [560 mL] 560 mL ?PEEP:  [5 cmH20-10 cmH20] 10 cmH20 ?Pressure Support:  [8 cmH20] 8 cmH20 ?Plateau Pressure:  [19 cmH20-21 cmH20] 19 cmH20  ? ?Intake/Output Summary (Last 24 hours) at 02/14/2022 0718 ?Last data filed at 02/14/2022 0400 ?Gross per 24 hour  ?Intake 2177.84 ml  ?Output 3125 ml  ?Net -947.16 ml  ? ? ?Filed Weights  ? 02/12/22 0423 02/13/22 0500 02/14/22 0500  ?Weight: 103.1 kg 102.6 kg 100.5 kg  ? ? ?Examination: ? ?General:  critically ill appearing on mech vent ?HEENT: MM pink/moist; ETT/EVD in place ?Neuro: according to nurse patient wiggled toes to command; sedated for me on exam; PERRL; cough/gag reflex in tact ?CV: s1s2, no m/r/g ?PULM:  dim clear BS bilaterally; thick ETT secretions; on mech vent PRVC 70% +10 ?GI: soft, bsx4 active  ?Extremities: warm/dry ?Skin: no rashes or lesions appreciated ? ? ?Resolved Hospital Problem list   ? ? ?Assessment & Plan:  ?Acute respiratory failure with hypoxemia due inability to protect airway  ?Right sided Pleural effusion ?P: ?-continue PRVC 6-8 cc/kg ?-wean fio2/peep for sats >92% ?-  consider repeat SBT later this am ?-consider extubation today able to pass SBT without desaturation ?-VAP prevention in place ?-Unasyn started yesterday for possible  exudative effusion; follow pleural cultures/trach aspirate ?-trend CXR ?-given lasix this am ?-Pulm toiletry: CPT scheduled; adding hypertonic neb/albuterol neb ? 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 ?Moderate aortic valve stenosis, moderate MR/TR ?Hypertension ?Hyperlipidemia ?Chronic atrial fibrillation on eliquis ?Echo 3/19>> EF 55-60%, LV normal function, moderate LV hypertrophy, RV Normal function, Moderately elevated PASP, LA severely dilated, Small pericardial effusion , No tamponade, Mild MV regurg, moderate calcification of the aortic valve. Mild aortic valve stenosis. IVC dilated in size RAP about 15 mm HG ?P: ?-continuous telemetry ?-CVP monitoring ?-IV lasix as needed ?-continue to hold eliquis; resume per neuro ?-prn labetalol for SBP goal above ? ?DM2 ?P: ?-ssi and cbg monitoring ?-adding TF coverage w/ elevated glucose  ? ?Myelodysplasia, received 1 U PRBC in OR 3/18 ?P: ?-onc f/u outpatient ?-trend cbc ? ?AKI> new 3/19, oliguric ?615 cc UO last 24 hours ?P: ?-IV lasix today ?-Trend BMP / urinary output ?-Replace electrolytes as indicated ?-Avoid nephrotoxic agents, ensure adequate renal perfusion ? ?Constipation ?P: ?-colace, miralax, senna ?-will give dulcolax suppository and fleet enema ?-continue to monitor for BM ? ? ?Best Practice (right click and "Reselect all SmartList Selections" daily)  ? ?Diet/type: tubefeeds ?DVT prophylaxis: SCD ?GI prophylaxis: PPI ?Lines: Central line and yes  and it is still needed ?Foley:  Yes, and it is still needed ?Code Status:  full code ?Last date of multidisciplinary goals of care discussion [per primary service; 3/22 spoke w/ son at bedside and updated] ? ?Critical care time: 35 minutes ?  ? ?Mikki Harbor, PA-C ?[facility] ?02/14/2022, 7:18 AM ? ?Please see [email or web address] for pager details. ? ?From 7A-7P if no response, please call [phone]. ?After hours, please call Warren Lacy [phone]. ? ? ? ? ?

## 2022-02-14 NOTE — Progress Notes (Signed)
Patient ID: Vernon [name], male   DOB: 1946-05-11, 76 y.o.   MRN: [id number] ?Vital signs are stable ?Patient pulled his wound drain which was a small Hemovac in the back of his neck.  This was about ready to come out anyway ?His motor function appears to be good ?He is at the brink of being extubated ?We will continue to follow clinically ?

## 2022-02-14 NOTE — Progress Notes (Signed)
STROKE TEAM PROGRESS NOTE  ? ?INTERVAL HISTORY ?Son and wife are at bedside.  Patient still intubated, on Precedex.  Per RN, was present at Toradol, patient become agitated again.  Per CCM Dr. Tacy [name] patient is a veteran was in [place] war and likely suffered PTSD.  Will try to extubate to see whether he is able to tolerate extubation.  We will repeat CT today.  He has pulled off his Hemovac drain earlier today and neurosurgery Dr. Ellene [name] no plan of further drainage. ? ? ?Vitals:  ? 02/14/22 1500 02/14/22 1511 02/14/22 1600 02/14/22 1700  ?BP: 124/64  137/60 134/83  ?Pulse: 78  82 86  ?Resp: (!) 23  19 (!) 24  ?Temp:   98.7 ?F (37.1 ?C)   ?TempSrc:   Axillary   ?SpO2: 100% 100% 96% 99%  ?Weight:      ?Height:      ? ?CBC:  ?Recent Labs  ?Lab 02/12/22 ?0415 02/12/22 ?0958 02/13/22 ?1012 02/13/22 ?1451 02/14/22 ?0450  ?WBC 10.6*   < > 9.3  --  8.4  ?NEUTROABS 8.5*  --  7.8*  --   --   ?HGB 9.0*   < > 7.9* 8.2* 8.0*  ?HCT 28.9*   < > 26.9* 24.0* 26.9*  ?MCV 98.6   < > 101.9*  --  100.0  ?PLT 190   < > 170  --  220  ? < > = values in this interval not displayed.  ? ?Basic Metabolic Panel:  ?Recent Labs  ?Lab 02/12/22 ?1601 02/12/22 ?2200 02/13/22 ?1012 02/13/22 ?1416 02/13/22 ?1451 02/14/22 ?9629 02/14/22 ?0450 02/14/22 ?5284 02/14/22 ?1457  ?NA  --    < > 161*   < > 164*   < > 158* 162* 162*  ?K  --   --  4.5  --  5.4*  --  4.5  --   --   ?CL  --   --  >130*  --   --   --  >130*  --   --   ?CO2  --   --  19*  --   --   --  19*  --   --   ?GLUCOSE  --   --  199*  --   --   --  237*  --   --   ?BUN  --   --  45*  --   --   --  46*  --   --   ?CREATININE  --   --  2.03*  --   --   --  1.99*  --   --   ?CALCIUM  --   --  8.1*  --   --   --  8.3*  --   --   ?MG 2.4  --  2.3  --   --   --  2.1  --   --   ?PHOS 3.6  --  3.4  --   --   --   --   --   --   ? < > = values in this interval not displayed.  ? ?Lipid Panel:  ?Recent Labs  ?Lab 02/11/22 ?0423 02/14/22 ?1324  ?CHOL 68  --   ?TRIG 79  77 46  ?HDL 27*  --   ?CHOLHDL 2.5   --   ?VLDL 16  --   ?[place] 25  --   ? ?HgbA1c:  ?Recent Labs  ?Lab 02/11/22 ?0423  ?HGBA1C 6.2*  ? ?Urine Drug  Screen: No results for input(s): LABOPIA, COCAINSCRNUR, LABBENZ, AMPHETMU, THCU, LABBARB in the last 168 hours.  ?Alcohol Level No results for input(s): ETH in the last 168 hours. ? ?IMAGING past 24 hours ?DG Chest Port 1 View ? ?Result Date: 02/14/2022 ?CLINICAL DATA:  Provided history: ETT, acute respiratory failure with hypoxia. EXAM: PORTABLE CHEST 1 VIEW COMPARISON:  Prior chest radiographs 02/13/2022 and earlier. FINDINGS: The ET tube terminates at the level of the clavicular heads. A right IJ approach central venous catheter is present with tip projecting at the level of the lower SVC. Partially visualized enteric tube coursing below the level of the left hemidiaphragm. Cardiomegaly, unchanged. Aortic atherosclerosis. Interval decrease in size of a left pleural effusion. Persistent although decreased opacity within the left lung base, which may reflect atelectasis and/or pneumonia. Unchanged mild ill-defined opacity within the right lung base. No evidence of pneumothorax. No acute bony abnormality identified. IMPRESSION: Position of lines and tubes, as described. Interval decrease in size of a left pleural effusion. Persistent although decreased opacity within the left lung base, which may reflect atelectasis and/or pneumonia. Unchanged mild ill-defined opacity within the right lung base, which may reflect atelectasis and/or pneumonia. Aortic Atherosclerosis (ICD10-I70.0). Electronically Signed   By: Kellie Simmering D.O.   On: 02/14/2022 08:08   ? ?PHYSICAL EXAM ?General:  Intubated, well-developed patient in no acute distress ?Respiratory: Respirations synchronous with ventilator ?Cardio: irregularly irregular heart rate and rhythm. ?Neurological  ?On exam, intubated on sedation, eyes closed, not open with voice, not following commands. With forced eye opening, eyes in mid position, not blinking to  visual threat, not tracking, PERRL. Corneal reflex bilaterally weakly present, gag and cough present. Breathing  over the vent.  Facial symmetry not able to test due to ET tube.  Tongue protrusion not cooperative. On pain stimulation, no withdrawal all extremities. No babinski. Sensation, coordination and gait not tested. ? ?ASSESSMENT/PLAN ?Vernon [name] is a 76 y.o. [name] with history of anemia, arthritis, GERD, DM2, HTN and a-fib on Eliquis presenting with vomiting and incoordiantion on the left side.  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  His Eliquis was reversed with [name] and he was transferred here.  His neurological status began to worsen and he was intubated.  Repeat CT demonstrated enlarging ICH.  Neurosurgery was consulted and decompressive suboccipital craniectomy was performed on 3/19 by Dr Vernon [name].  3% HTS was started for cerebral edema.. 23.5% HTS bolus will be given as Na is still at 145 despite 3% HTS. ? ?ICH:  left cerebellar ICH s/p suboccipital decompression and hematoma evacuation likely due to Eliquis use and hypertension ?Code Stroke CT head acute 2.2 cm IPH in middle cerebellar peduncle with surrounding edema ?CTA head & neck no LVO or hemodynamically significant stenosis, no evidence of AVM of aneurysm, moderate right vertebral artery stenosis ?Follow up CT 3/18 increased size of left cerebellar IPH with effaced fourth ventricle ?Follow up CT 3/19 s/p left suboccipital craniectomy and cerebellar hematoma evacuation with moderate volume of residual posterior fossa blood ?MRI - 02/12/22 - Left suboccipital craniectomy for hematoma evacuation. Roughly 13 mL residual left cerebellar blood tracking across midline to the right. Cerebellar edema with partial effacement of the 4th ventricle. But basilar cistern patency is adequate, with no ventriculomegaly. Trace intraventricular and subarachnoid hemorrhage.  ?CT 3/20 stable mass effect and no hydrocephalus ?CT repeat pending ?2D Echo EF  55-60% ?LDL 25 ?HgbA1c 6.2 ?VTE prophylaxis - heparin subcu ?Eliquis (apixaban) daily prior to admission,  now on No antithrombotic secondary to IPH ?Therapy recommendations:  not medically ready for therapy ?Disposition:  pending ? ?Obstructive hydrocephalus  ?S/p suboccipital decompression and hematoma evacuation ?CT 3/18 demonstrated effacement of fourth ventricle with developing obstructive hydrocephalus ?Suboccipital craniectomy performed by neurosurgery ?Parieto-occipital hemovac drain placed ?CT and MRI 3/20 no hydrocephalus ?CT repeat pending ?On keppra post op ? ?Cerebral Edema ?Edema seen surrounding left cerebellar ICH on CT scan with risk for hydrocephalus ?3% HTS at 75 mL/hr->40cc->off ?Na goal 150-155 ?23.5% bolus given x 1 ?Na  145->149->151->157->156->158->156->161->162 ?Monitor Na every 6 hours ? ?Hypertension ?Hypotensive ?Home meds:  lisinopril 40 mg daily ?Unstable ?Keep SBP 105-160, MAP 75 ?Currently off norepinephrine ?Long-term BP goal normotensive ? ?Hyperlipidemia ?Home meds:  simvastatin 80 mg daily ?LDL 25, goal < 70 ?Hold off statin due to low LDL ? ?Diabetes type II Controlled ?Home meds:  metformin 500 mg BID ?HgbA1c 6.2, goal < 7.0 ?CBGs  ?SSI ? ?Respiratory failure ?Pleural effusion ?Patient intubated due to inability to protect airway ?CCM on board ?Ventilator management per CCM ?S/p right thoracentesis ? ?Afib  ?On eliquis PTA ?Now bradycardia  ?No antithrombotics for now given ICH ? ?AKI ?Cre 1.24-1.Vernon-1.73->1.86->1.83->2.03->1.99 ?Hyperkalemia K 6.1-5.7-5.6 s/p Lokelma ->5.4 s/p Lokelma->4.5 ?BMP monitoring ?CCM on board ? ?Other Stroke Risk Factors ?Advanced Age >/= 72  ?Former Cigarette smoker ?Obesity, Body mass index is 32.72 kg/m?., BMI >/= 30 associated with increased stroke risk, recommend weight loss, diet and exercise as appropriate  ? ?Other Active Problems ?Hyperkalemia, K 6.1-5.7-5.6 s/p Lokelma ->5.4 s/p Lokelma->4.5 ?Mild leukocytosis - WBC's  8.2->10.6->11.0->9.3->8.4 ?Hypomagnesemia - Magnesium - 1.6->2.5 ->2.3 ? ? ?Hospital day # 4 ? ?This patient is critically ill due to cerebellar ICH, hydrocephalus, status post suboccipital decompression and hematoma evacuation

## 2022-02-15 ENCOUNTER — Inpatient Hospital Stay (HOSPITAL_COMMUNITY): Payer: Medicare Other

## 2022-02-15 DIAGNOSIS — E875 Hyperkalemia: Secondary | ICD-10-CM

## 2022-02-15 DIAGNOSIS — N179 Acute kidney failure, unspecified: Secondary | ICD-10-CM | POA: Diagnosis not present

## 2022-02-15 DIAGNOSIS — D6189 Other specified aplastic anemias and other bone marrow failure syndromes: Secondary | ICD-10-CM

## 2022-02-15 DIAGNOSIS — J9601 Acute respiratory failure with hypoxia: Secondary | ICD-10-CM | POA: Diagnosis not present

## 2022-02-15 DIAGNOSIS — D649 Anemia, unspecified: Secondary | ICD-10-CM

## 2022-02-15 DIAGNOSIS — I614 Nontraumatic intracerebral hemorrhage in cerebellum: Secondary | ICD-10-CM | POA: Diagnosis not present

## 2022-02-15 DIAGNOSIS — G936 Cerebral edema: Secondary | ICD-10-CM | POA: Diagnosis not present

## 2022-02-15 LAB — CBC
HCT: 22.3 % — ABNORMAL LOW (ref 39.0–52.0)
HCT: 25.8 % — ABNORMAL LOW (ref 39.0–52.0)
Hemoglobin: 6.9 g/dL — CL (ref 13.0–17.0)
Hemoglobin: 7.9 g/dL — ABNORMAL LOW (ref 13.0–17.0)
MCH: 30.7 pg (ref 26.0–34.0)
MCH: 30.4 pg (ref 26.0–34.0)
MCHC: 30.9 g/dL (ref 30.0–36.0)
MCHC: 30.6 g/dL (ref 30.0–36.0)
MCV: 99.1 fL (ref 80.0–100.0)
MCV: 99.2 fL (ref 80.0–100.0)
Platelets: 123 10*3/uL — ABNORMAL LOW (ref 150–400)
Platelets: 130 10*3/uL — ABNORMAL LOW (ref 150–400)
RBC: 2.25 MIL/uL — ABNORMAL LOW (ref 4.22–5.81)
RBC: 2.6 MIL/uL — ABNORMAL LOW (ref 4.22–5.81)
RDW: 29.3 % — ABNORMAL HIGH (ref 11.5–15.5)
RDW: 27.8 % — ABNORMAL HIGH (ref 11.5–15.5)
WBC: 4 10*3/uL (ref 4.0–10.5)
WBC: 5 10*3/uL (ref 4.0–10.5)
nRBC: 0 % (ref 0.0–0.2)
nRBC: 0 % (ref 0.0–0.2)

## 2022-02-15 LAB — GLUCOSE, CAPILLARY
Glucose-Capillary: 221 mg/dL — ABNORMAL HIGH (ref 70–99)
Glucose-Capillary: 147 mg/dL — ABNORMAL HIGH (ref 70–99)
Glucose-Capillary: 149 mg/dL — ABNORMAL HIGH (ref 70–99)
Glucose-Capillary: 184 mg/dL — ABNORMAL HIGH (ref 70–99)
Glucose-Capillary: 200 mg/dL — ABNORMAL HIGH (ref 70–99)
Glucose-Capillary: 192 mg/dL — ABNORMAL HIGH (ref 70–99)

## 2022-02-15 LAB — BASIC METABOLIC PANEL
BUN: 48 mg/dL — ABNORMAL HIGH (ref 8–23)
BUN: 44 mg/dL — ABNORMAL HIGH (ref 8–23)
CO2: 21 mmol/L — ABNORMAL LOW (ref 22–32)
CO2: 21 mmol/L — ABNORMAL LOW (ref 22–32)
Calcium: 8.3 mg/dL — ABNORMAL LOW (ref 8.9–10.3)
Calcium: 8.8 mg/dL — ABNORMAL LOW (ref 8.9–10.3)
Chloride: >130 mmol/L (ref 98–111)
Chloride: >130 mmol/L (ref 98–111)
Creatinine, Ser: 1.77 mg/dL — ABNORMAL HIGH (ref 0.61–1.24)
Creatinine, Ser: 1.66 mg/dL — ABNORMAL HIGH (ref 0.61–1.24)
GFR, Estimated: 40 mL/min — ABNORMAL LOW (ref >60)
GFR, Estimated: 43 mL/min — ABNORMAL LOW (ref >60)
Glucose, Bld: 232 mg/dL — ABNORMAL HIGH (ref 70–99)
Glucose, Bld: 207 mg/dL — ABNORMAL HIGH (ref 70–99)
Potassium: 5.4 mmol/L — ABNORMAL HIGH (ref 3.5–5.1)
Potassium: 4.3 mmol/L (ref 3.5–5.1)
Sodium: 158 mmol/L — ABNORMAL HIGH (ref 135–145)
Sodium: 158 mmol/L — ABNORMAL HIGH (ref 135–145)

## 2022-02-15 LAB — SODIUM
Sodium: 159 mmol/L — ABNORMAL HIGH (ref 135–145)
Sodium: 158 mmol/L — ABNORMAL HIGH (ref 135–145)

## 2022-02-15 LAB — PREPARE RBC (CROSSMATCH)

## 2022-02-15 MED ORDER — INSULIN GLARGINE-YFGN 100 UNIT/ML ~~LOC~~ SOLN
15.0000 [IU] | Freq: Every day | SUBCUTANEOUS | Status: DC
  Administered 2022-02-15: 15 [IU] via SUBCUTANEOUS
  Filled 2022-02-15 (×2): qty 0.15

## 2022-02-15 MED ORDER — FUROSEMIDE 10 MG/ML IJ SOLN
40.0000 mg | Freq: Once | INTRAMUSCULAR | Status: AC
  Administered 2022-02-15: 40 mg via INTRAVENOUS
  Filled 2022-02-15: qty 4

## 2022-02-15 MED ORDER — SODIUM CHLORIDE 0.9% IV SOLUTION: Freq: Once | INTRAVENOUS | Status: DC

## 2022-02-15 MED ORDER — CALCIUM GLUCONATE-NACL 1-0.675 GM/50ML-% IV SOLN
1.0000 g | Freq: Once | INTRAVENOUS | Status: AC
  Administered 2022-02-15: 1000 mg via INTRAVENOUS
  Filled 2022-02-15: qty 50

## 2022-02-15 MED ORDER — FREE WATER
200.0000 mL | Freq: Four times a day (QID) | Status: DC
  Administered 2022-02-15 – 2022-02-16 (×4): 200 mL

## 2022-02-15 MED ORDER — FREE WATER
250.0000 mL | Status: DC
  Administered 2022-02-15: 250 mL

## 2022-02-15 MED ORDER — SODIUM ZIRCONIUM CYCLOSILICATE 10 G PO PACK
10.0000 g | PACK | Freq: Once | ORAL | Status: AC
  Administered 2022-02-15: 10 g
  Filled 2022-02-15: qty 1

## 2022-02-15 NOTE — Progress Notes (Signed)
Patient ID: Vernon [name], male   DOB: 1946-03-04, 76 y.o.   MRN: [id number] ?Patient opens eyes and speaks softly answering in one-word sentences.  Moves all 4 extremities and is following commands.  Seems somewhat restless and a bit agitated.  Maintaining his sats well. ?

## 2022-02-15 NOTE — Progress Notes (Addendum)
Nutrition Follow-up ? ?DOCUMENTATION CODES:  ? ?Not applicable ? ?INTERVENTION:  ? ?Tube feeding via [name]: ?Vital 1.5 at 60 ml/h (1440 ml per day) ?Prosource TF 45 ml daily ? ?Provides 2200 kcal, 108 gm protein, 1100 ml free water daily ? ? ?200 ml free water every 6 hours ?Total free water: 1900 ml  ? ? ?NUTRITION DIAGNOSIS:  ? ?Inadequate oral intake related to inability to eat as evidenced by NPO status. ?Ongoing.  ? ?GOAL:  ? ?Patient will meet greater than or equal to 90% of their needs ?Met with TF.  ? ?MONITOR:  ? ?Vent status, Labs, Weight trends ? ?REASON FOR ASSESSMENT:  ? ?Consult, Ventilator ?Enteral/tube feeding initiation and management ? ?ASSESSMENT:  ? ?76 y.o. male presented to [facility] ED with worsening L side coordination, vomiting, and upset stomach. PMH includes GERD, T2DM, HTN, and A. Fib. Pt admitted with an L ICH. ? ?Pt discussed during ICU rounds and with RN. Failed swallow eval. Remains on precedex.  ? ?3/18 - Intubated  ?3/19 - suboccipital craniectomy w/ R ventriculostomy ?3/20 - cortrak placed; tip in distal stomach/proximal duodenum  ?3/22 - EVD out, extubated ? ?Medications reviewed and include: SSI, 15 units semglee daily, protonix, senokot-s ?Precedex  ? ?Labs reviewed: Na 158, K 5.4, Cl >130 ?A1C: 6.2 ?CBG's: 149-221 ? ?Diet Order:   ?Diet Order   ? ?       ?  Diet NPO time specified  Diet effective now       ?  ? ?  ?  ? ?  ? ? ?EDUCATION NEEDS:  ? ?Not appropriate for education at this time ? ?Skin:  Skin Assessment: Reviewed RN Assessment ? ?Last BM:  Prior to Admission ? ?Height:  ? ?Ht Readings from Last 1 Encounters:  ?02/10/22 $RemoveBe'5\' 9"'ujnMPKyEs$  (1.753 m)  ? ? ?Weight:  ? ?Wt Readings from Last 1 Encounters:  ?02/14/22 100.5 kg  ? ? ?Ideal Body Weight:  72.7 kg ? ?BMI:  Body mass index is 32.72 kg/m?. ? ?Estimated Nutritional Needs:  ? ?Kcal:  2100-2300 ? ?Protein:  105-120 grams ? ?Fluid:  2.1-2.3 L ? ?Lockie Pares., RD, LDN, CNSC ?See AMiON for contact information  ? ?

## 2022-02-15 NOTE — Progress Notes (Signed)
Patient's chloride greater than 130. CCM notified.  ?

## 2022-02-15 NOTE — Progress Notes (Signed)
Hemoglobin level has dropped to 6.9 relative to 8.0 yesterday. Type and cross with transfusion of 2 units PRBC has been ordered.  ? ?Hemoccult all stools.  ? ?Electronically signed: Dr. Kerney Elbe ? ?

## 2022-02-15 NOTE — Progress Notes (Signed)
? ?NAME:  Vernon [name], MRN:  [id number], DOB:  1946-02-07, LOS: 5 ?ADMISSION DATE:  02/10/2022, CONSULTATION DATE:  3/18 ?REFERRING MD:  Sarita Haver, CHIEF COMPLAINT:  headache, weakness, nausea  ? ?History of Present Illness:  ?76 y/o male went to [facility] on 3/18 with weakness and nausea, found to have a cerebellar hemorrhage.  Repeat head CT showed expansion. He vomited and developed hypoxemia requiring intubation for airway protection.  He went for an emergent craniotomy on 3/18, hematoma evacuation and drain placement. PCCM consulted for ventilator management.  ? ?Pertinent  Medical History  ?Hypertension ?Anemia ?GERD ?DM2 ?Hiatal hernia ?Atrial fibrillation on eliquis ?Aortic stenosis ?Hypertension ?myelodysplasia ? ?Significant Hospital Events: ?Including procedures, antibiotic start and stop dates in addition to other pertinent events   ?3/18 admission, intubation for airway protection after vomiting, underwent Suboccipital craniectomy and evacuation of cerebellar hemorrhage.  Placement of right parieto-occipital ventriculostomy by Dr. Ellene Route ?3/19 repeat CT head > s/p left suboccipital craniectomy and cerebellar hematoma evacuation with moderate residual volume of posterior fossa blood, improved patency of 4th ventricle, decreased lateral and 3rd ventricle size; EVD in place, otherwise stable CT ?3/20  MRI Brain>>Left suboccipital craniectomy for hematoma evacuation. Roughly 13 mL residual left cerebellar blood tracking across midline to the right. Cerebellar edema with partial effacement of the 4th ventricle. But basilar cistern patency is adequate, with no ventriculomegaly. Sequelae of attempted EVD placement across the splenium of the corpus callosum. Trace intraventricular and subarachnoid hemorrhage. Background mild to moderate nonspecific cerebral white matter signal changes. 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 ?3/21: patient on PSV; possibly extubate  today; thoracentesis for R pleural effusion; off pressors; sedation switched to cleviprex; hypertonic saline stopped due to hypernatremia ?3/22: pulled EVD out; NSG aware and will just continue to monitor; extubated ?3/23: remains on precedex; off levo ? ?  ?Interim History / Subjective:  ?Extubated yesterday; doing well on [place] ?Overnight hgb 6.9; given 1u prbc ?K 5.4 and glucose elevated 232 ?Remains on precedex for delirium ?Wiggles toes and tries to stick tongue out to command ? ?Objective   ?Blood pressure (!) 112/58, pulse 81, temperature 98.5 ?F (36.9 ?C), temperature source Axillary, resp. rate (!) 24, height '5\' 9"'$  (1.753 m), weight 100.5 kg, SpO2 95 %. ?CVP:  [10 mmHg-32 mmHg] 31 mmHg  ?Vent Mode: CPAP;PSV ?FiO2 (%):  [40 %-70 %] 40 % ?PEEP:  [5 cmH20] 5 cmH20 ?Pressure Support:  [5 cmH20] 5 cmH20  ? ?Intake/Output Summary (Last 24 hours) at 02/15/2022 0745 ?Last data filed at 02/15/2022 0500 ?Gross per 24 hour  ?Intake 2633.41 ml  ?Output 2575 ml  ?Net 58.41 ml  ? ? ?Filed Weights  ? 02/12/22 0423 02/13/22 0500 02/14/22 0500  ?Weight: 103.1 kg 102.6 kg 100.5 kg  ? ? ?Examination: ? ?General: ill appearing male patient ?HEENT: MM pink/dry; [place] in place ?Neuro: wiggled toes and sticks tongue out to command; remains on precedex; PERRL ?CV: s1s2, no m/r/g; afib w/ rate 60s ?PULM:  dim clear BS bilaterally; on [place] ?GI: soft, bsx4 active  ?Extremities: warm/dry ?Skin: no rashes or lesions appreciated ? ? ?Resolved Hospital Problem list   ? ? ?Assessment & Plan:  ?Acute respiratory failure with hypoxemia due inability to protect airway  ?Right sided Pleural effusion ?P: ?-extubated 3/22 ?-continue to wean [place] for sats >92% ?-continue Unasyn x 7days for possible exudative effusion; follow pleural cultures/trach aspirate ?-Pulm toiletry: CPT scheduled; adding hypertonic neb/albuterol neb; consider adding flutter and IS  when able to be awake to perform ? ?Spontaneous Cerebellar hematoma, s/p evacuation on 3/18  ?Cerebral  Edema ?Obstructive hydrocephalus ?Acute encephalopathy ?P: ?-Nsg and neuro following: appreciate recs ?-frequent neuro checks ?-limit sedating meds ?-hypertonic remains on hold with elevated Na; increasing free water for Na goal 150-155; frequent na checks ?-off levo; SBP goal 105-160 per neuro ?-continue keppra ?-PT/OT/SLP ?-remains on precedex ?-consider increasing dose of seroquel to help come off precedex; check qtc ? ?Moderate aortic valve stenosis, moderate MR/TR ?Hypertension ?Hyperlipidemia ?Chronic atrial fibrillation on eliquis ?Echo 3/19>> EF 55-60%, LV normal function, moderate LV hypertrophy, RV Normal function, Moderately elevated PASP, LA severely dilated, Small pericardial effusion , No tamponade, Mild MV regurg, moderate calcification of the aortic valve. Mild aortic valve stenosis. IVC dilated in size RAP about 15 mm HG ?P: ?-continuous telemetry ?-CVP monitoring ?-IV lasix as needed ?-continue to hold eliquis; resume per neuro ?-prn labetalol for SBP goal above ? ?DM2 ?P: ?-cont ssi and cbg monitoring ?-cont TF coverage  ?-adding basal insulin ? ?Myelodysplasia, received 1 U PRBC in OR 3/18 ?Anemia ?P: ?-hgb 6.9 this am; giving 1 U PRBC  ?-onc f/u outpatient ?-trend cbc ? ?Hyperkalemia ?AKI> new 3/19, oliguric ?P: ?-giving calcium gluconate and lokelma; repeat bmp later today ?-Trend BMP / urinary output ?-Replace electrolytes as indicated ?-Avoid nephrotoxic agents, ensure adequate renal perfusion ? ?Constipation: BM 3/22 ?P: ?-cont senna ?-other Bowel regimen prn for constipation ?-continue to monitor BM ? ? ?Best Practice (right click and "Reselect all SmartList Selections" daily)  ? ?Diet/type: tubefeeds ?DVT prophylaxis: SCD ?GI prophylaxis: PPI ?Lines: No longer needed.  Order written to d/c  ?Foley:  N/A ?Code Status:  full code ?Last date of multidisciplinary goals of care discussion [per primary service; 3/23 spoke w/ son at bedside and updated] ? ?Critical care time: 35 minutes ?  ? ?Mikki Harbor, PA-C ?[facility] ?02/15/2022, 7:45 AM ? ?Please see [email or web address] for pager details. ? ?From 7A-7P if no response, please call [phone]. ?After hours, please call Warren Lacy [phone]. ? ? ? ? ?

## 2022-02-15 NOTE — Progress Notes (Signed)
STROKE TEAM PROGRESS NOTE  ? ?INTERVAL HISTORY ?Son and wife are at bedside.  Patient extubated yesterday, tolerating well so far.  Still has mild restless and agitation, off Precedex.  Repeat CT showed stable cerebral edema and no hydrocephalus.  Hemoglobin down to 6.9 today received PRBC transfusion. ? ? ?Vitals:  ? 02/15/22 1600 02/15/22 1700 02/15/22 1800 02/15/22 1900  ?BP:  (!) 152/73 (!) 168/77 (!) 158/65  ?Pulse: 93 84 89 87  ?Resp: (!) 25  (!) 23 (!) 30  ?Temp: 98.6 ?F (37 ?C)     ?TempSrc:      ?SpO2: 95% 91% 96% 97%  ?Weight:      ?Height:      ? ?CBC:  ?Recent Labs  ?Lab 02/12/22 ?0415 02/12/22 ?0958 02/13/22 ?1012 02/13/22 ?1451 02/15/22 ?2947 02/15/22 ?1540  ?WBC 10.6*   < > 9.3   < > 4.0 5.0  ?NEUTROABS 8.5*  --  7.8*  --   --   --   ?HGB 9.0*   < > 7.9*   < > 6.9* 7.9*  ?HCT 28.9*   < > 26.9*   < > 22.3* 25.8*  ?MCV 98.6   < > 101.9*   < > 99.1 99.2  ?PLT 190   < > 170   < > 123* 130*  ? < > = values in this interval not displayed.  ? ?Basic Metabolic Panel:  ?Recent Labs  ?Lab 02/12/22 ?1601 02/12/22 ?2200 02/13/22 ?1012 02/13/22 ?1416 02/14/22 ?0450 02/14/22 ?0903 02/15/22 ?6546 02/15/22 ?1540  ?NA  --    < > 161*   < > 158*   < > 158* 158*  ?K  --   --  4.5   < > 4.5  --  5.4* 4.3  ?CL  --   --  >130*  --  >130*  --  >130* >130*  ?CO2  --   --  19*  --  19*  --  21* 21*  ?GLUCOSE  --   --  199*  --  237*  --  232* 207*  ?BUN  --   --  45*  --  46*  --  48* 44*  ?CREATININE  --   --  2.03*  --  1.99*  --  1.77* 1.66*  ?CALCIUM  --   --  8.1*  --  8.3*  --  8.3* 8.8*  ?MG 2.4  --  2.3  --  2.1  --   --   --   ?PHOS 3.6  --  3.4  --   --   --   --   --   ? < > = values in this interval not displayed.  ? ?Lipid Panel:  ?Recent Labs  ?Lab 02/11/22 ?0423 02/14/22 ?5035  ?CHOL 68  --   ?TRIG 79  77 46  ?HDL 27*  --   ?CHOLHDL 2.5  --   ?VLDL 16  --   ?Wirt 25  --   ? ?HgbA1c:  ?Recent Labs  ?Lab 02/11/22 ?0423  ?HGBA1C 6.2*  ? ?Urine Drug Screen: No results for input(s): LABOPIA, COCAINSCRNUR,  LABBENZ, AMPHETMU, THCU, LABBARB in the last 168 hours.  ?Alcohol Level No results for input(s): ETH in the last 168 hours. ? ?IMAGING past 24 hours ?CT HEAD WO CONTRAST (5MM) ? ?Result Date: 02/15/2022 ?CLINICAL DATA:  Hemorrhagic stroke.  Patient removed his own drain. EXAM: CT HEAD WITHOUT CONTRAST TECHNIQUE: Contiguous axial images were obtained from the base of  the skull through the vertex without intravenous contrast. RADIATION DOSE REDUCTION: This exam was performed according to the departmental dose-optimization program which includes automated exposure control, adjustment of the mA and/or kV according to patient size and/or use of iterative reconstruction technique. COMPARISON:  Three days ago FINDINGS: Brain: Left superior cerebellar edema and hemorrhage without interval progression. Possibly better seen fourth ventricle, certainly still narrowed. Small volume hemorrhage with edema at the right occipital ventriculostomy tract, hemorrhage is subsiding. No hydrocephalus, acute infarct, or new abnormality. Vascular: No hyperdense vessel or unexpected calcification. Skull: Left suboccipital craniectomy. Sinuses/Orbits: Negative IMPRESSION: No interval progression of cerebellar edema and hemorrhage. No hydrocephalus. Electronically Signed   By: Jorje Guild M.D.   On: 02/15/2022 11:45  ? ?DG Chest Port 1 View ? ?Result Date: 02/15/2022 ?CLINICAL DATA:  Acute respiratory failure with hypoxia. Post extubation. EXAM: PORTABLE CHEST 1 VIEW COMPARISON:  02/14/2022; 02/13/2022 FINDINGS: Grossly unchanged enlarged cardiac silhouette and mediastinal contours given reduced lung volumes. Interval extubation. Otherwise, stable positioning of remaining support apparatus. Pulmonary vasculature appears slightly less distinct with cephalization of flow. Worsening perihilar and left basilar heterogeneous opacities. No definite pleural effusions, though note, the left costophrenic angle is excluded from view. No acute osseous  abnormalities. IMPRESSION: 1. Interval extubation without evidence of pneumothorax. 2. Otherwise, stable positioning of remaining support apparatus. 3. Worsening pulmonary edema and perihilar and left basilar opacities, atelectasis versus infiltrate. Continued attention on follow-up is recommended. Electronically Signed   By: Sandi Mariscal M.D.   On: 02/15/2022 08:05   ? ?PHYSICAL EXAM ?General:  Intubated, well-developed patient, intermittent restless/agitation ?Respiratory: on [place], not in distress ?Cardio: irregularly irregular heart rate and rhythm. ?Neurological  ?On exam, eyes closed, barely open with voice, able to node head on commands but not following other commands. With forced eye opening, eyes in mid position, not blinking to visual threat, not tracking, PERRL. Corneal reflex bilaterally weakly present, gag and cough present. Facial symmetrical.  Tongue protrusion not cooperative. On pain stimulation, withdrawal in all extremities. No babinski. Sensation, coordination and gait not tested. ? ?ASSESSMENT/PLAN ?Vernon [name] is a 76 y.o. male with history of anemia, arthritis, GERD, DM2, HTN and a-fib on Eliquis presenting with vomiting and incoordiantion on the left side.  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  His Eliquis was reversed with [name] and he was transferred here.  His neurological status began to worsen and he was intubated.  Repeat CT demonstrated enlarging ICH.  Neurosurgery was consulted and decompressive suboccipital craniectomy was performed on 3/19 by Dr Ellene Route.  3% HTS was started for cerebral edema.. 23.5% HTS bolus will be given as Na is still at 145 despite 3% HTS. ? ?ICH:  left cerebellar ICH s/p suboccipital decompression and hematoma evacuation likely due to Eliquis use and hypertension ?Code Stroke CT head acute 2.2 cm IPH in middle cerebellar peduncle with surrounding edema ?CTA head & neck no LVO or hemodynamically significant stenosis, no evidence of AVM of aneurysm,  moderate right vertebral artery stenosis ?Follow up CT 3/18 increased size of left cerebellar IPH with effaced fourth ventricle ?Follow up CT 3/19 s/p left suboccipital craniectomy and cerebellar hematoma evacuation with moderate volume of residual posterior fossa blood ?MRI - 02/12/22 - Left suboccipital craniectomy for hematoma evacuation. Roughly 13 mL residual left cerebellar blood tracking across midline to the right. Cerebellar edema with partial effacement of the 4th ventricle. But basilar cistern patency is adequate, with no ventriculomegaly. Trace intraventricular and subarachnoid hemorrhage.  ?  CT 3/20 and 3/22 stable mass effect and no hydrocephalus ?2D Echo EF 55-60% ?LDL 25 ?HgbA1c 6.2 ?VTE prophylaxis - heparin subcu ?Eliquis (apixaban) daily prior to admission, now on No antithrombotic secondary to IPH ?Therapy recommendations:  not medically ready for therapy ?Disposition:  pending ? ?Obstructive hydrocephalus  ?S/p suboccipital decompression and hematoma evacuation ?CT 3/18 demonstrated effacement of fourth ventricle with developing obstructive hydrocephalus ?Suboccipital craniectomy performed by neurosurgery ?Parieto-occipital hemovac drain placed ?CT 3/20 and 3/22 and MRI 3/20 no hydrocephalus ?On keppra post op ? ?Cerebral Edema ?Edema seen surrounding left cerebellar ICH on CT scan with risk for hydrocephalus ?3% HTS at 75 mL/hr->40cc->off ?Na goal 150-155 ?23.5% bolus given x 1 ?Na  145->149->151->157->156->158->156->161->162->158 ?Monitor Na every 6 hours ? ?Hypertension ?Hypotensive ?Home meds:  lisinopril 40 mg daily ?stable ?Currently off norepinephrine ?Long-term BP goal normotensive ? ?Hyperlipidemia ?Home meds:  simvastatin 80 mg daily ?LDL 25, goal < 70 ?Hold off statin due to low LDL ? ?Diabetes type II Controlled ?Home meds:  metformin 500 mg BID ?HgbA1c 6.2, goal < 7.0 ?CBGs  ?SSI ? ?Respiratory failure ?Pleural effusion ?Patient intubated due to inability to protect airway ?CCM on  board ?Ventilator management per CCM ?S/p right thoracentesis ? ?Afib  ?On eliquis PTA ?Now bradycardia  ?No antithrombotics for now given ICH ? ?AKI ?Cre 1.24-1.62-1.73->1.86->1.83->2.03->1.99->1.77->1.66 ?[place]

## 2022-02-15 NOTE — Evaluation (Addendum)
Clinical/Bedside Swallow Evaluation ?Patient Details  ?Name: Vernon [name] ?MRN: [id number] ?Date of Birth: 08-Jul-1946 ? ?Today's Date: 02/15/2022 ?Time: SLP Start Time (ACUTE ONLY): 6063 SLP Stop Time (ACUTE ONLY): 1326 ?SLP Time Calculation (min) (ACUTE ONLY): 12 min ? ?Past Medical History:  ?Past Medical History:  ?Diagnosis Date  ? Anemia   ? Aortic stenosis   ? Arthritis   ? Complication of anesthesia   ? hard time getting bp up after knee replacement  ? Diabetes mellitus without complication (Vernon [name])   ? GERD (gastroesophageal reflux disease)   ? occ tums prn  ? History of hiatal hernia   ? Hypertension   ? 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Vernon [name])   ? ?Past Surgical History:  ?Past Surgical History:  ?Procedure Laterality Date  ? CARPAL TUNNEL RELEASE  2012  ? CRANIOTOMY N/A 02/10/2022  ? Procedure: SUBOCCIPITAL CRANIECTOMY FOR EVACUATION OF CEREBELLAR HEMATOMA;  Surgeon: Kristeen Miss, MD;  Location: [place] [facility];  Service: Neurosurgery;  Laterality: N/A;  ? JOINT REPLACEMENT Right 2010  ? SHOULDER ARTHROSCOPY WITH ROTATOR CUFF REPAIR AND OPEN BICEPS TENODESIS Right 11/09/2019  ? Procedure: RIGHT SHOULDER ARTHROSCOPY WITH SUBSCAPULARIS REPAIR, SUBACROMIAL DECOMPRESSION,MINI OPEN ROTATOR CUFF REPAIR;  Surgeon: Leim Fabry, MD;  Location: [facility];  Service: Orthopedics;  Laterality: Right;  ? ?HPI:  ?Pt is a 76 y.o. male who presented to the ED on 3/18 with worseing L side coordination and N/V. 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 Surrounding edema with mild effacement of the fourth ventricle. Pt had an epsidoe fo emesis and developed hypoxemia, requiring intubation for airway protection. ETT 3/18-3/22. Pt s/p emergent craniotomy on 3/18, hematoma evacuation and drain placement. [name] placed 3/20. PMH: anemia, arthritis, GERD, DM2, GERD, HTN, afibb on Eliquis.  ?  ?Assessment / Plan / Recommendation  ?Clinical  Impression ? Pt was seen for bedside swallow evaluation with his wife and son present. Both parties denied observance of symptoms of oropharyngeal dysphagia at baseline. The evaluation was limited due to pt's lethargy. A complete oral mechanism exam was not completed due to pt's difficulty follow commands. He presented with limited mandibular teeth; his wife reported that he wears full maxillary dentures and partial mandibular dentures and that they are not at the hospital. Pt demonstrated reduced bolus awareness and signs of aspiration with thin liquids via tsp. Bolus manipulation was limited in the absence of verbal prompts. Prolonged coughing was noted with liquids and thick, yellow secretions were expectorated following intake of thin water via spoon. With tactile stimulation to maintain alertness and intermittent prompts for bolus manipulation, pt swallowed individual ice chips without overt s/sx of aspiration. It is recommended that the pt's NPO status be maintained, but that limited ice chips (i.e., ~3 if lethargy persists) be allowed after oral care.   ?SLP Visit Diagnosis: Dysphagia, unspecified (R13.10) ?   ?Aspiration Risk ? Moderate aspiration risk;Severe aspiration risk  ?  ?Diet Recommendation Ice chips PRN after oral care;NPO (3 ice chips/hr if pt's level of alertness remains poor)  ? ?Medication Administration: Via alternative means  ?  ?Other  Recommendations Oral Care Recommendations: Oral care QID;Oral care prior to ice chip/H20   ? ?Recommendations for follow up therapy are one component of a multi-disciplinary discharge planning process, led by the attending physician.  Recommendations may be updated based on patient status, additional functional criteria and insurance authorization. ? ?Follow up Recommendations  (TBD)  ? ? ?  ?  Assistance Recommended at Discharge    ?Functional Status Assessment Patient has had a recent decline in their functional status and demonstrates the ability to make  significant improvements in function in a reasonable and predictable amount of time.  ?Frequency and Duration min 2x/week  ?2 weeks ?  ?   ? ?Prognosis Prognosis for Safe Diet Advancement: Good  ? ?  ? ?Swallow Study   ?General Date of Onset: 02/14/22 ?HPI: Pt is a 76 y.o. male who presented to the ED on 3/18 with worseing L side coordination and N/V. 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 Surrounding edema with mild effacement of the fourth ventricle. Pt had an epsidoe fo emesis and developed hypoxemia, requiring intubation for airway protection. ETT 3/18-3/22. Pt s/p emergent craniotomy on 3/18, hematoma evacuation and drain placement. [name] placed 3/20. PMH: anemia, arthritis, GERD, DM2, GERD, HTN, afibb on Eliquis. ?Type of Study: Bedside Swallow Evaluation ?Previous Swallow Assessment: none ?Diet Prior to this Study: NPO ?Temperature Spikes Noted: No ?Respiratory Status: Nasal cannula ?History of Recent Intubation: Yes ?Length of Intubations (days): 4 days ?Date extubated: 02/14/22 ?Behavior/Cognition: Cooperative;Pleasant mood;Lethargic/Drowsy ?Oral Cavity Assessment: Within Functional Limits ?Oral Care Completed by SLP: No ?Oral Cavity - Dentition: Missing dentition;Dentures, not available ?Vision: Functional for self-feeding ?Self-Feeding Abilities: Able to feed self ?Patient Positioning: Upright in bed;Postural control adequate for testing ?Baseline Vocal Quality: Not observed ?Volitional Cough: Cognitively unable to elicit ?Volitional Swallow: Able to elicit  ?  ?Oral/Motor/Sensory Function Overall Oral Motor/Sensory Function:  (UTA)   ?Ice Chips Ice chips: Impaired ?Presentation: Spoon ?Oral Phase Impairments: Poor awareness of bolus ?Oral Phase Functional Implications: Prolonged oral transit ?Pharyngeal Phase Impairments: Cough - Delayed   ?Thin Liquid Thin Liquid: Impaired ?Presentation:  Spoon ?Oral Phase Impairments: Poor awareness of bolus ?Oral Phase Functional Implications: Prolonged oral transit;Left anterior spillage;Right anterior spillage ?Pharyngeal  Phase Impairments: Cough - Immediate;Cough - Delayed  ?  ?Nectar Thick Nectar Thick Liquid: Not tested   ?Honey Thick Honey Thick Liquid: Not tested   ?Puree Puree: Not tested   ?Solid ? ? ?  Solid: Not tested  ? ?  ?Vihan Santagata I. Hardin Negus, [place], CCC-SLP ?Acute Rehabilitation Services ?Office number [phone] ?Pager [phone] ? ?[name] ?02/15/2022,2:35 PM ? ? ? ? ? ?

## 2022-02-15 NOTE — Progress Notes (Signed)
Paged to bedside for concern of increased work of breathing.  ? ?Blood pressure (!) 168/77, pulse 89, temperature 98.6 ?F (37 ?C), resp. rate (!) 23, height '5\' 9"'$  (1.753 m), weight 100.5 kg, SpO2 96 % on 8L  ? ?Exam: He is awake. Does not follow commands, however this is unchanged from prior per bedside RN. Mouth breathing, intermittent abdominal accessory muscle use. Lung sounds are diminished bilaterally, L>R.  ? ?CXR from earlier today with increased vascular congestion. Oxygen requirement is stable at 8L. Bedside RN who cared for him earlier today notes that breathing appears stable. ? ?Plan ?-lasix '40mg'$  IV once ?-nebs ? ?Mitzi Hansen, MD ?

## 2022-02-15 NOTE — Progress Notes (Signed)
Hemoglobin 6.9 stroke notified and ordered 2 units RBC.  ?

## 2022-02-16 ENCOUNTER — Inpatient Hospital Stay (HOSPITAL_COMMUNITY): Payer: Medicare Other

## 2022-02-16 DIAGNOSIS — N179 Acute kidney failure, unspecified: Secondary | ICD-10-CM | POA: Diagnosis not present

## 2022-02-16 DIAGNOSIS — I614 Nontraumatic intracerebral hemorrhage in cerebellum: Secondary | ICD-10-CM | POA: Diagnosis not present

## 2022-02-16 DIAGNOSIS — E1165 Type 2 diabetes mellitus with hyperglycemia: Secondary | ICD-10-CM

## 2022-02-16 DIAGNOSIS — D6189 Other specified aplastic anemias and other bone marrow failure syndromes: Secondary | ICD-10-CM | POA: Diagnosis not present

## 2022-02-16 DIAGNOSIS — J9601 Acute respiratory failure with hypoxia: Secondary | ICD-10-CM | POA: Diagnosis not present

## 2022-02-16 LAB — TYPE AND SCREEN
ABO/RH(D): O POS
Antibody Screen: NEGATIVE
Unit division: 0
Unit division: 0

## 2022-02-16 LAB — CBC
HCT: 25.1 % — ABNORMAL LOW (ref 39.0–52.0)
Hemoglobin: 7.8 g/dL — ABNORMAL LOW (ref 13.0–17.0)
MCH: 29.9 pg (ref 26.0–34.0)
MCHC: 31.1 g/dL (ref 30.0–36.0)
MCV: 96.2 fL (ref 80.0–100.0)
Platelets: 138 10*3/uL — ABNORMAL LOW (ref 150–400)
RBC: 2.61 MIL/uL — ABNORMAL LOW (ref 4.22–5.81)
RDW: 28.1 % — ABNORMAL HIGH (ref 11.5–15.5)
WBC: 5.5 10*3/uL (ref 4.0–10.5)
nRBC: 0 % (ref 0.0–0.2)

## 2022-02-16 LAB — BASIC METABOLIC PANEL
Anion gap: 6 (ref 5–15)
BUN: 39 mg/dL — ABNORMAL HIGH (ref 8–23)
CO2: 23 mmol/L (ref 22–32)
Calcium: 9.1 mg/dL (ref 8.9–10.3)
Chloride: 127 mmol/L — ABNORMAL HIGH (ref 98–111)
Creatinine, Ser: 1.61 mg/dL — ABNORMAL HIGH (ref 0.61–1.24)
GFR, Estimated: 44 mL/min — ABNORMAL LOW (ref >60)
Glucose, Bld: 232 mg/dL — ABNORMAL HIGH (ref 70–99)
Potassium: 4.1 mmol/L (ref 3.5–5.1)
Sodium: 156 mmol/L — ABNORMAL HIGH (ref 135–145)

## 2022-02-16 LAB — GLUCOSE, CAPILLARY
Glucose-Capillary: 212 mg/dL — ABNORMAL HIGH (ref 70–99)
Glucose-Capillary: 188 mg/dL — ABNORMAL HIGH (ref 70–99)
Glucose-Capillary: 216 mg/dL — ABNORMAL HIGH (ref 70–99)
Glucose-Capillary: 191 mg/dL — ABNORMAL HIGH (ref 70–99)
Glucose-Capillary: 184 mg/dL — ABNORMAL HIGH (ref 70–99)
Glucose-Capillary: 170 mg/dL — ABNORMAL HIGH (ref 70–99)

## 2022-02-16 LAB — BPAM RBC
Blood Product Expiration Date: 202303272359
Blood Product Expiration Date: 202304152359
ISSUE DATE / TIME: 202303231207
Unit Type and Rh: 5100
Unit Type and Rh: 9500

## 2022-02-16 LAB — SODIUM: Sodium: 157 mmol/L — ABNORMAL HIGH (ref 135–145)

## 2022-02-16 LAB — MAGNESIUM: Magnesium: 1.8 mg/dL (ref 1.7–2.4)

## 2022-02-16 LAB — BODY FLUID CULTURE W GRAM STAIN: Culture: NO GROWTH

## 2022-02-16 MED ORDER — INSULIN GLARGINE-YFGN 100 UNIT/ML ~~LOC~~ SOLN
20.0000 [IU] | Freq: Every day | SUBCUTANEOUS | Status: DC
  Administered 2022-02-16 – 2022-02-17 (×2): 20 [IU] via SUBCUTANEOUS
  Filled 2022-02-16 (×2): qty 0.2

## 2022-02-16 MED ORDER — PANTOPRAZOLE 2 MG/ML SUSPENSION
40.0000 mg | Freq: Every day | ORAL | Status: DC
  Administered 2022-02-16 – 2022-02-26 (×11): 40 mg
  Filled 2022-02-16 (×11): qty 20

## 2022-02-16 MED ORDER — POLYETHYLENE GLYCOL 3350 17 G PO PACK
17.0000 g | PACK | Freq: Every day | ORAL | Status: DC
Start: 2022-02-16 — End: 2022-02-16
  Filled 2022-02-16: qty 1

## 2022-02-16 MED ORDER — FUROSEMIDE 10 MG/ML IJ SOLN
40.0000 mg | Freq: Once | INTRAMUSCULAR | Status: AC
  Administered 2022-02-16: 40 mg via INTRAVENOUS
  Filled 2022-02-16: qty 4

## 2022-02-16 MED ORDER — AMLODIPINE BESYLATE 5 MG PO TABS: 5.0000 mg | ORAL_TABLET | Freq: Every day | ORAL | Status: DC

## 2022-02-16 MED ORDER — CARVEDILOL 3.125 MG PO TABS
3.1250 mg | ORAL_TABLET | Freq: Two times a day (BID) | ORAL | Status: DC
  Filled 2022-02-16: qty 1

## 2022-02-16 MED ORDER — MAGNESIUM SULFATE 2 GM/50ML IV SOLN
2.0000 g | Freq: Once | INTRAVENOUS | Status: AC
  Administered 2022-02-16: 2 g via INTRAVENOUS
  Filled 2022-02-16: qty 50

## 2022-02-16 MED ORDER — CARVEDILOL 3.125 MG PO TABS: 3.1250 mg | ORAL_TABLET | Freq: Two times a day (BID) | ORAL | Status: DC

## 2022-02-16 MED ORDER — PROMETHAZINE HCL 12.5 MG PO TABS
12.5000 mg | ORAL_TABLET | ORAL | Status: DC | PRN
  Filled 2022-02-16: qty 2

## 2022-02-16 MED ORDER — INSULIN ASPART 100 UNIT/ML IJ SOLN
2.0000 [IU] | INTRAMUSCULAR | Status: DC
  Administered 2022-02-16 – 2022-02-26 (×60): 2 [IU] via SUBCUTANEOUS

## 2022-02-16 MED ORDER — POLYETHYLENE GLYCOL 3350 17 G PO PACK
17.0000 g | PACK | Freq: Every day | ORAL | Status: DC
  Administered 2022-02-18 – 2022-02-26 (×7): 17 g
  Filled 2022-02-16 (×8): qty 1

## 2022-02-16 MED ORDER — FREE WATER
200.0000 mL | Status: DC
  Administered 2022-02-16 – 2022-02-17 (×7): 200 mL

## 2022-02-16 MED ORDER — CARVEDILOL 3.125 MG PO TABS
3.1250 mg | ORAL_TABLET | Freq: Two times a day (BID) | ORAL | Status: DC
  Administered 2022-02-16 – 2022-02-24 (×17): 3.125 mg
  Filled 2022-02-16 (×16): qty 1

## 2022-02-16 MED ORDER — AMLODIPINE BESYLATE 10 MG PO TABS
10.0000 mg | ORAL_TABLET | Freq: Every day | ORAL | Status: DC
  Administered 2022-02-16: 10 mg
  Filled 2022-02-16: qty 1

## 2022-02-16 MED ORDER — CARVEDILOL 3.125 MG PO TABS
3.1250 mg | ORAL_TABLET | Freq: Two times a day (BID) | ORAL | Status: DC
Start: 2022-02-16 — End: 2022-02-16

## 2022-02-16 NOTE — Progress Notes (Signed)
? ?NAME:  Vernon [name], MRN:  [id number], DOB:  November 19, 1946, LOS: 6 ?ADMISSION DATE:  02/10/2022, CONSULTATION DATE:  3/18 ?REFERRING MD:  Sarita Haver, CHIEF COMPLAINT:  headache, weakness, nausea  ? ?History of Present Illness:  ?76 y/o male went to [facility] on 3/18 with weakness and nausea, found to have a cerebellar hemorrhage.  Repeat head CT showed expansion. He vomited and developed hypoxemia requiring intubation for airway protection.  He went for an emergent craniotomy on 3/18, hematoma evacuation and drain placement. PCCM consulted for ventilator management.  ? ?Pertinent  Medical History  ?Hypertension ?Anemia ?GERD ?DM2 ?Hiatal hernia ?Atrial fibrillation on eliquis ?Aortic stenosis ?Hypertension ?myelodysplasia ? ?Significant Hospital Events: ?Including procedures, antibiotic start and stop dates in addition to other pertinent events   ?3/18 admission, intubation for airway protection after vomiting, underwent Suboccipital craniectomy and evacuation of cerebellar hemorrhage.  Placement of right parieto-occipital ventriculostomy by Dr. Ellene Route ?3/19 repeat CT head > s/p left suboccipital craniectomy and cerebellar hematoma evacuation with moderate residual volume of posterior fossa blood, improved patency of 4th ventricle, decreased lateral and 3rd ventricle size; EVD in place, otherwise stable CT ?3/20  MRI Brain>>Left suboccipital craniectomy for hematoma evacuation. Roughly 13 mL residual left cerebellar blood tracking across midline to the right. Cerebellar edema with partial effacement of the 4th ventricle. But basilar cistern patency is adequate, with no ventriculomegaly. Sequelae of attempted EVD placement across the splenium of the corpus callosum. Trace intraventricular and subarachnoid hemorrhage. Background mild to moderate nonspecific cerebral white matter signal changes. 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 ?3/21: patient on PSV; thoracentesis for R  pleural effusion; off pressors; sedation switched to cleviprex; hypertonic saline stopped due to hypernatremia; started on unasyn, trach asp sent ?3/22: pulled EVD out; NSG aware and will just continue to monitor; extubated ?3/23: remains on precedex; off levo, transfused 1 unit PRBC, repeat CTH stable ? ?  ?Interim History / Subjective:  ?Failed SLP yesterday.  Remains NPO ?Stable on HFNC, weaning now at 6L  ?Slightly hypertensive overnight, otherwise, no events  ? ?Objective   ?Blood pressure (!) 156/72, pulse 73, temperature 98.7 ?F (37.1 ?C), temperature source Axillary, resp. rate (!) 24, height '5\' 9"'$  (1.753 m), weight 102.1 kg, SpO2 94 %. ?CVP:  [23 mmHg-24 mmHg] 23 mmHg  ?   ? ?Intake/Output Summary (Last 24 hours) at 02/16/2022 0857 ?Last data filed at 02/16/2022 0800 ?Gross per 24 hour  ?Intake 3745.36 ml  ?Output 5200 ml  ?Net -1454.64 ml  ? ?Filed Weights  ? 02/13/22 0500 02/14/22 0500 02/16/22 0500  ?Weight: 102.6 kg 100.5 kg 102.1 kg  ? ? ?Examination: ?General:  Ill appearing elderly male sitting upright in bed in mild distress at times ?HEENT: MM pink/moist, poor dentition, thick drool hanging from mouth, left nare cortrak, pupils 4/reactive, mild scleral edema and icterus  ?Neuro:  doesn't open eyes, but responds to verbal, oriented to name and place, says its April, MAE spont except LUE- no response to noxious stimuli, RUE 3/5, slurred speech ?CV: irir, afib, rate controlled ?PULM:  tachypneic but not labored, lungs clear anteriorly, slight occ exp wheeze on right, diminished in bases  ?GI: obese/ protuberant, hypo bs, male purwick, mild tenderness on lower abd ?Extremities: warm/dry, no LE edema, mild dependent edema LUE ?Skin: no rashes  ? ?CXR 3/24 > increased right perihilar opacities concerning for developing edema vs pneumonia (probable) ? ?Resolved Hospital Problem list   ? ? ?Assessment & Plan:  ?  Acute respiratory failure with hypoxemia due inability to protect airway  ?Right sided Pleural  effusion, transudate by lights criteria   ?- right thoracentesis 3/21; GS neg, cytology neg ?-extubated 3/22 ?Suspected aspiration pneumonia ?P: ?- remains intubation risk  ?- remains NPO, SLP following.  Failed SLP 3/23 ?- aspiration precautions  ?- continue to wean [place] for sats >92%.  O2 requirements have been stable and slowly weaning ?- continue Unasyn x 7days  ?- follow pleural culture and trach asp> both NGTD ?- Pulm toiletry: CPT scheduled; hypertonic neb/albuterol neb x 3 days; consider adding flutter and IS when able to be awake to perform ? ?Spontaneous Cerebellar hematoma, s/p evacuation on 3/18  ?Cerebral Edema ?Obstructive hydrocephalus ?Acute encephalopathy ?- ICH secondary to HTN and Eliquis  ?P: ?- NSGY and neuro following: appreciate recs ?- CTH stable 3/23 ?- cont frequent neuro exams  ?- HTS held 3/21 given at goal.  Na remains > 155.  Continue FWF and q 6hrs Na  ?- SBP goal remains < 160  ?- continue keppra per neuro ?- PT/OT/SLP ?- statin on hold given low LDL- per neuro  ? ?Moderate aortic valve stenosis, moderate MR/TR ?Hypertension ?Hyperlipidemia ?Chronic atrial fibrillation on eliquis ?Echo 3/19>> EF 55-60%, LV normal function, moderate LV hypertrophy, RV Normal function, Moderately elevated PASP, LA severely dilated, Small pericardial effusion , No tamponade, Mild MV regurg, moderate calcification of the aortic valve. Mild aortic valve stenosis. IVC dilated in size RAP about 15 mm HG ?P: ?- remains in afib but rate controlled ?- continue to hold eliquis pending NSGY/ neuro recs ?- holding home lisinopril given AKI but starting low dose coreg for blood pressure ?- prn labetalol for SBP goal above 160 ? ?DM2 ?P: ?- glucose range mostly still > 200 ?- cont ssi mod and cbg monitoring ?- adding TF coverage  ?- increase semglee  ? ?Myelodysplasia, received 1 U PRBC in OR 3/18 ?Anemia ?- s/p 1 unit PRBC 3/23 ?P: ?- Hgb 6.9> 7.8 after transfusion yesterday ?- trend CBC, transfuse < 7 ?- onc f/u  outpatient ? ?Hyperkalemia, resolved ?AKI> new 3/19, oliguric ?Hypernatremia, iatrogenic  ?P: ?- K 4.1 today  ?- sCr continues to slowly improve ?- remains net +2.3L, great UOP from diuresis yest ?- continue FWF ?- additional diuresis today  ?-Trend BMP / urinary output ?-Replace electrolytes as indicated ?-Avoid nephrotoxic agents, ensure adequate renal perfusion ? ?Constipation: BM 3/22 ?P: ?-cont senna.  Add miralax  ?-other Bowel regimen prn for constipation ?-continue to monitor BM ? ? ?Best Practice (right click and "Reselect all SmartList Selections" daily)  ? ?Diet/type: tubefeeds ?DVT prophylaxis: SCD ?GI prophylaxis: PPI ?Lines: N/A ?Foley:  N/A ?Code Status:  full code ?Last date of multidisciplinary goals of care discussion [per primary service; 3/23 spoke w/ son at bedside and updated] ? ?Critical care time: 30 minutes ?  ? ? ? ?Kennieth Rad, ACNP ?[facility] ?02/16/2022, 8:57 AM ? ?See Amion for pager ?If no response to pager, please call PCCM consult pager ?After 7:00 pm call Elink   ? ? ? ? ?

## 2022-02-16 NOTE — Progress Notes (Signed)
Physical Therapy Treatment ?Patient Details ?Name: Vernon [name] ?MRN: [id number] ?DOB: June 25, 1946 ?Today's Date: 02/16/2022 ? ? ?History of Present Illness Pt. is a 76 y.o. male presenting to [facility] on 3/18 with worseing L side coordination along with N/V. CTH demonstrated a 2,2 cm L cerebellar peduncle ICH. He had an epsidoe fo emisis and developed hypoxemia, requiring intubation as well as undergoing an emergent craniotomy on 3/18; hematoma evacuation and drain placement. PMH significant for anemia, arthritis, GERD, DM2, GERD, HTN, afibb on Eliquis. ? ?  ?PT Comments  ? ? Pt remains lethargic but does demonstrate improved command following compared to previous session. Pt is totalA for all functional mobility but demonstrates some initiation of postural righting at times this session. Pt will benefit from continued acute PT services in an effort to improve strength and reduce caregiver burden. PT continues to recommend AIR admission as long as the pt's level of arousal continues to improve.  ?Recommendations for follow up therapy are one component of a multi-disciplinary discharge planning process, led by the attending physician.  Recommendations may be updated based on patient status, additional functional criteria and insurance authorization. ? ?Follow Up Recommendations ? Acute inpatient rehab (3hours/day) (some improvement in arousal and command following but further improvement needed) ?  ?  ?Assistance Recommended at Discharge Frequent or constant Supervision/Assistance  ?Patient can return home with the following Two people to help with walking and/or transfers;Two people to help with bathing/dressing/bathroom;Assistance with cooking/housework;Assistance with feeding;Direct supervision/assist for financial management;Direct supervision/assist for medications management;Assist for transportation;Help with stairs or ramp for entrance ?  ?Equipment Recommendations ? Wheelchair (measurements PT);Wheelchair cushion  (measurements PT);Hospital bed;Other (comment) (hoyer lift)  ?  ?Recommendations for Other Services   ? ? ?  ?Precautions / Restrictions Precautions ?Precautions: Fall ?Precaution Comments: [name] ?Restrictions ?Weight Bearing Restrictions: No  ?  ? ?Mobility ? Bed Mobility ?Overal bed mobility: Needs Assistance ?Bed Mobility: Supine to Sit, Sit to Supine ?  ?  ?Supine to sit: Total assist, +2 for physical assistance, HOB elevated ?Sit to supine: Total assist, +2 for physical assistance, HOB elevated ?  ?  ?  ? ?Transfers ?  ?  ?  ?  ?  ?  ?  ?  ?  ?  ?  ? ?Ambulation/Gait ?  ?  ?  ?  ?  ?  ?  ?  ? ? ?Stairs ?  ?  ?  ?  ?  ? ? ?Wheelchair Mobility ?  ? ?Modified Rankin (Stroke Patients Only) ?  ? ? ?  ?Balance Overall balance assessment: Needs assistance ?Sitting-balance support: Single extremity supported, Bilateral upper extremity supported, Feet supported ?Sitting balance-Leahy Scale: Poor ?Sitting balance - Comments: totalA initially, brief periods of postural righting noted although unable to correct postural sway greater than a few inches outside of center of mass ?Postural control: Posterior lean, Right lateral lean, Left lateral lean ?  ?  ?  ?  ?  ?  ?  ?  ?  ?  ?  ?  ?  ?  ?  ? ?  ?Cognition Arousal/Alertness: Lethargic ?Behavior During Therapy: Flat affect ?Overall Cognitive Status: Impaired/Different from baseline ?Area of Impairment: Problem solving, Awareness, Following commands ?  ?  ?  ?  ?  ?  ?  ?  ?  ?  ?  ?Following Commands: Follows one step commands inconsistently, Follows one step commands with increased time ?  ?  ?Problem Solving: Slow processing, Decreased initiation, Requires verbal  cues, Requires tactile cues ?  ?  ?  ? ?  ?Exercises   ? ?  ?General Comments General comments (skin integrity, edema, etc.): VSS on 6L HFNC ?  ?  ? ?Pertinent Vitals/Pain Pain Assessment ?Pain Assessment: Faces ?Faces Pain Scale: No hurt  ? ? ?Home Living   ?  ?  ?  ?  ?  ?  ?  ?  ?  ?   ?  ?Prior Function     ?  ?  ?   ? ?PT Goals (current goals can now be found in the care plan section) Acute Rehab PT Goals ?Patient Stated Goal: PT goal to improve functional mobility quality in an effort to reduce caregiver burden. Pt unable to state goal ?Progress towards PT goals: Progressing toward goals (very slowly) ? ?  ?Frequency ? ? ? Min 3X/week ? ? ? ?  ?PT Plan Current plan remains appropriate  ? ? ?Co-evaluation PT/OT/SLP Co-Evaluation/Treatment: Yes ?Reason for Co-Treatment: Complexity of the patient's impairments (multi-system involvement);Necessary to address cognition/behavior during functional activity;For patient/therapist safety ?PT goals addressed during session: Mobility/safety with mobility;Balance;Strengthening/ROM ?OT goals addressed during session: ADL's and self-care ?  ? ?  ?AM-PAC PT "6 Clicks" Mobility   ?Outcome Measure ? Help needed turning from your back to your side while in a flat bed without using bedrails?: Total ?Help needed moving from lying on your back to sitting on the side of a flat bed without using bedrails?: Total ?Help needed moving to and from a bed to a chair (including a wheelchair)?: Total ?Help needed standing up from a chair using your arms (e.g., wheelchair or bedside chair)?: Total ?Help needed to walk in hospital room?: Total ?Help needed climbing 3-5 steps with a railing? : Total ?6 Click Score: 6 ? ?  ?End of Session Equipment Utilized During Treatment: Oxygen ?Activity Tolerance: Patient limited by lethargy ?Patient left: in bed;with call bell/phone within reach;with bed alarm set;with family/visitor present;with restraints reapplied ?Nurse Communication: Mobility status;Need for lift equipment ?PT Visit Diagnosis: Other abnormalities of gait and mobility (R26.89);Muscle weakness (generalized) (M62.81);Other symptoms and signs involving the nervous system (R29.898) ?  ? ? ?Time: 7209-4709 ?PT Time Calculation (min) (ACUTE ONLY): 39 min ? ?Charges:  $Therapeutic Activity: 8-22  mins          ?          ? ?Zenaida Niece, PT, DPT ?Acute Rehabilitation ?Pager: [phone] ?Office [phone] ? ? ? ?Zenaida Niece ?02/16/2022, 2:32 PM ? ?

## 2022-02-16 NOTE — Progress Notes (Signed)
Occupational Therapy Treatment ?Patient Details ?Name: Vernon [name] ?MRN: [id number] ?DOB: 1946-03-26 ?Today's Date: 02/16/2022 ? ? ?History of present illness Pt. is a 76 y.o. male presenting to [facility] on 3/18 with worseing L side coordination along with N/V. CTH demonstrated a 2,2 cm L cerebellar peduncle ICH. He had an epsidoe fo emisis and developed hypoxemia, requiring intubation as well as undergoing an emergent craniotomy on 3/18; hematoma evacuation and drain placement. PMH significant for anemia, arthritis, GERD, DM2, GERD, HTN, afibb on Eliquis. ?  ?OT comments ? Seen in conjunction with PT due to lethargy and need for additional skilled level of care to elicit responses.  ST was able to join in order to trial liquids for swallowing, as patient did become more alert at times with sitting edge of bed.  OT to continue efforts in the acute setting, with AIR continuing to be recommended for post acute rehab prior to returning home.     ? ?Recommendations for follow up therapy are one component of a multi-disciplinary discharge planning process, led by the attending physician.  Recommendations may be updated based on patient status, additional functional criteria and insurance authorization. ?   ?Follow Up Recommendations ? Acute inpatient rehab (3hours/day)  ?  ?Assistance Recommended at Discharge Frequent or constant Supervision/Assistance  ?Patient can return home with the following ? Two people to help with walking and/or transfers;Two people to help with bathing/dressing/bathroom;Assistance with cooking/housework;Direct supervision/assist for medications management;Direct supervision/assist for financial management;Assist for transportation;Help with stairs or ramp for entrance ?  ?Equipment Recommendations ?    ?  ?Recommendations for Other Services Rehab consult ? ?  ?Precautions / Restrictions Precautions ?Precautions: Fall ?Precaution Comments: [name] ?Restrictions ?Weight Bearing Restrictions: No  ? ? ?   ? ?Mobility Bed Mobility ?  ?Bed Mobility: Supine to Sit, Sit to Supine ?  ?  ?Supine to sit: Total assist, +2 for physical assistance, +2 for safety/equipment ?Sit to supine: Total assist, +2 for safety/equipment, +2 for physical assistance ?  ?  ?  ? ?Transfers ?  ?  ?  ?  ?  ?  ?  ?  ?  ?General transfer comment: unable to attempt ?  ?  ?Balance Overall balance assessment: Needs assistance ?Sitting-balance support: No upper extremity supported, Feet supported ?Sitting balance-Leahy Scale: Poor ?Sitting balance - Comments: Remains essentially total assist.  He did support himself for brief moments, but any challenge to balance required assist ?Postural control: Posterior lean, Left lateral lean, Right lateral lean ?  ?  ?  ?  ?  ?  ?  ?  ?  ?  ?  ?  ?  ?  ?   ? ?ADL either performed or assessed with clinical judgement  ? ?ADL   ?  ?  ?  ?  ?  ?  ?  ?  ?  ?  ?  ?  ?  ?  ?  ?  ?  ?  ?  ?General ADL Comments: pt is total A for all aspects of care at bed level due to level of arousal ?  ? ?Extremity/Trunk Assessment Upper Extremity Assessment ?Upper Extremity Assessment: RUE deficits/detail;LUE deficits/detail ?RUE Deficits / Details: No real AROM noted, family reporting trace thumbs up times one.  No pain reflex ?RUE Sensation: decreased light touch ?RUE Coordination: decreased fine motor;decreased gross motor ?LUE Deficits / Details: No AROM noted, flaccid and hand swollen.  No pain reflex ?LUE Sensation: decreased light touch ?LUE Coordination: decreased  fine motor;decreased gross motor ?  ?Lower Extremity Assessment ?Lower Extremity Assessment: Defer to PT evaluation ?  ?Cervical / Trunk Assessment ?Cervical / Trunk Assessment: Kyphotic ?Cervical / Trunk Exceptions: crani ?  ? ?Vision   ?Vision Assessment?: Vision impaired- to be further tested in functional context ?  ?Perception Perception ?Perception: Not tested ?  ?Praxis Praxis ?Praxis: Not tested ?  ? ?Cognition Arousal/Alertness: Lethargic ?Behavior During  Therapy: Flat affect ?Overall Cognitive Status: Difficult to assess ?Area of Impairment: Following commands, Awareness, Orientation ?  ?  ?  ?  ?  ?  ?  ?  ?Orientation Level: Person, Place ?  ?  ?Following Commands: Follows one step commands inconsistently ?  ?Awareness: Intellectual ?  ?  ?  ?  ?   ?   ? ?  ?   ? ? ?  ?    ? ? ?Pertinent Vitals/ Pain       Pain Assessment ?Pain Assessment: Faces ?Faces Pain Scale: No hurt ?Pain Intervention(s): Monitored during session ? ?   ?  ?  ?  ?  ?  ?  ?  ?  ?  ?  ?  ?  ?  ?  ?  ?  ?  ?  ? ?  ?    ?  ?  ?  ?   ? ?Frequency ? Min 2X/week  ? ? ? ? ?  ?Progress Toward Goals ? ?OT Goals(current goals can now be found in the care plan section) ? Progress towards OT goals: Progressing toward goals ? ?Acute Rehab OT Goals ?OT Goal Formulation: Patient unable to participate in goal setting ?Time For Goal Achievement: 02/28/22 ?Potential to Achieve Goals: Good  ?Plan Discharge plan remains appropriate   ? ?Co-evaluation ? ? ? PT/OT/SLP Co-Evaluation/Treatment: Yes ?Reason for Co-Treatment: Complexity of the patient's impairments (multi-system involvement);Necessary to address cognition/behavior during functional activity;For patient/therapist safety ?  ?OT goals addressed during session: ADL's and self-care ?  ? ?  ?AM-PAC OT "6 Clicks" Daily Activity     ?Outcome Measure ? ? Help from another person eating meals?: Total ?Help from another person taking care of personal grooming?: Total ?Help from another person toileting, which includes using toliet, bedpan, or urinal?: Total ?Help from another person bathing (including washing, rinsing, drying)?: Total ?Help from another person to put on and taking off regular upper body clothing?: Total ?Help from another person to put on and taking off regular lower body clothing?: Total ?6 Click Score: 6 ? ?  ?End of Session Equipment Utilized During Treatment: Oxygen ? ?OT Visit Diagnosis: Repeated falls (R29.6);Other abnormalities of gait and  mobility (R26.89);Unsteadiness on feet (R26.81);Muscle weakness (generalized) (M62.81);Pain;Other (comment) ?  ?Activity Tolerance Patient limited by lethargy ?  ?Patient Left in bed;with call bell/phone within reach;with family/visitor present ?  ?Nurse Communication Mobility status ?  ? ?   ? ?Time: 4259-5638 ?OT Time Calculation (min): 39 min ? ?Charges: OT General Charges ?$OT Visit: 1 Visit ?OT Treatments ?$Therapeutic Activity: 8-22 mins ? ?02/16/2022 ? ?RP, OTR/L ? ?Acute Rehabilitation Services ? ?Office:  [phone] ? ? ?Sophia Sperry D Gelena Klosinski ?02/16/2022, 2:15 PM ?

## 2022-02-16 NOTE — Progress Notes (Signed)
Speech Language Pathology Treatment: Dysphagia  ?Patient Details ?Name: Vernon [name] ?MRN: [id number] ?DOB: [birth year] ?Today's Date: 02/16/2022 ?Time: 3716-9678 ?SLP Time Calculation (min) (ACUTE ONLY): 16 min ? ?Assessment / Plan / Recommendation ?Clinical Impression ? Pt was seen for co-treatment with PT and OT to maximize pt's level of alertness and participation. He was seated on the edge of bed, but still required frequent verbal and tactile cues for alertness. He tolerated ice chips, puree solids, and nectar thick liquids via straw without overt s/sx of aspiration. Bolus manipulation was improved compared to yesterday, but still not WNL and verbal prompts were still necessary. In the absence of prompts, ice chips remained on his tongue and nectar thick liquids spilled out of his mouth without any effort to swallow. Labial seal and stripping were reduced. Coughing was noted with thin liquids via straw, suggesting aspiration. It is recommended that pt's NPO status continue to be maintained; he may still have limited ice chips after oral care if he is adequately alert. SLP will continue to follow pt.  ?  ?HPI HPI: Pt is a 76 y.o. male who presented to the ED on 3/18 with worseing L side coordination and N/V. 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 Surrounding edema with mild effacement of the fourth ventricle. Pt had an epsidoe fo emesis and developed hypoxemia, requiring intubation for airway protection. ETT 3/18-3/22. Pt s/p emergent craniotomy on 3/18, hematoma evacuation and drain placement. [name] placed 3/20. PMH: anemia, arthritis, GERD, DM2, GERD, HTN, afibb on Eliquis. ?  ?   ?SLP Plan ? Continue with current plan of care ? ?  ?  ?Recommendations for follow up therapy are one component of a multi-disciplinary discharge planning process, led by the attending physician.  Recommendations may be  updated based on patient status, additional functional criteria and insurance authorization. ?  ? ?Recommendations  ?Diet recommendations: NPO (Limited (i.e., 3-5/hour pending alertness) ice chips alertness permitting) ?Medication Administration: Via alternative means  ?   ?    ?   ? ? ? ? Oral Care Recommendations: Oral care QID;Oral care prior to ice chip/H20 ?Follow Up Recommendations:  (TBD) ?SLP Visit Diagnosis: Dysphagia, unspecified (R13.10) ?Plan: Continue with current plan of care ? ? ? ? ?  ?  ?Vernon [name] I. Hardin Negus, [place], CCC-SLP ?Acute Rehabilitation Services ?Office number [phone] ?Pager [phone] ? ? ?Vernon [name] ? ?02/16/2022, 3:24 PM ? ? ? ?  ?

## 2022-02-16 NOTE — Progress Notes (Signed)
?  Transition of Care (TOC) Screening Note ? ? ?Patient Details  ?Name: Vernon [name] ?Date of Birth: 27-Apr-1946 ? ? ?Transition of Care (TOC) CM/SW Contact:    ?Benard Halsted, LCSW ?Phone Number: ?02/16/2022, 9:32 AM ? ? ? ?Transition of Care Department [facility]) has reviewed patient. We will continue to monitor patient advancement through interdisciplinary progression rounds. If new patient transition needs arise, please place a TOC consult. ? ? ?

## 2022-02-16 NOTE — Progress Notes (Signed)
Patient ID: Vernon [name], male   DOB: 11-25-1946, 76 y.o.   MRN: [id number] ?Patient is more alert today opening eyes not verbal left upper extremity seems weak but level of consciousness is slightly improved he will occasionally follow some commands.  Respiratory status is big issue.  Making slow progress. ?

## 2022-02-16 NOTE — Progress Notes (Addendum)
STROKE TEAM PROGRESS NOTE  ? ?INTERVAL HISTORY ?Patient is seen in his room with no family at the bedside.  He has been hemodynamically stable and has had no acute events overnight.  HGB is stable at 7.8 following transfusion. ? ? ?Vitals:  ? 02/16/22 0900 02/16/22 1000 02/16/22 1100 02/16/22 1200  ?BP: (!) 160/71 131/67 131/61 (!) 148/87  ?Pulse: 76 84 82 83  ?Resp: (!) 26 (!) 27 (!) 30 (!) 30  ?Temp:    99.5 ?F (37.5 ?C)  ?TempSrc:    Axillary  ?SpO2: 94% 95% 95% 97%  ?Weight:      ?Height:      ? ?CBC:  ?Recent Labs  ?Lab 02/12/22 ?0415 02/12/22 ?0958 02/13/22 ?1012 02/13/22 ?1451 02/15/22 ?1540 02/16/22 ?0223  ?WBC 10.6*   < > 9.3   < > 5.0 5.5  ?NEUTROABS 8.5*  --  7.8*  --   --   --   ?HGB 9.0*   < > 7.9*   < > 7.9* 7.8*  ?HCT 28.9*   < > 26.9*   < > 25.8* 25.1*  ?MCV 98.6   < > 101.9*   < > 99.2 96.2  ?PLT 190   < > 170   < > 130* 138*  ? < > = values in this interval not displayed.  ? ? ?Basic Metabolic Panel:  ?Recent Labs  ?Lab 02/12/22 ?1601 02/12/22 ?2200 02/13/22 ?1012 02/13/22 ?1416 02/14/22 ?0450 02/14/22 ?0903 02/15/22 ?1540 02/15/22 ?2114 02/16/22 ?0223 02/16/22 ?0756  ?NA  --    < > 161*   < > 158*   < > 158*   < > 156* 157*  ?K  --   --  4.5   < > 4.5   < > 4.3  --  4.1  --   ?CL  --   --  >130*  --  >130*   < > >130*  --  127*  --   ?CO2  --   --  19*  --  19*   < > 21*  --  23  --   ?GLUCOSE  --   --  199*  --  237*   < > 207*  --  232*  --   ?BUN  --   --  45*  --  46*   < > 44*  --  39*  --   ?CREATININE  --   --  2.03*  --  1.99*   < > 1.66*  --  1.61*  --   ?CALCIUM  --   --  8.1*  --  8.3*   < > 8.8*  --  9.1  --   ?MG 2.4  --  2.3  --  2.1  --   --   --  1.8  --   ?PHOS 3.6  --  3.4  --   --   --   --   --   --   --   ? < > = values in this interval not displayed.  ? ? ?Lipid Panel:  ?Recent Labs  ?Lab 02/11/22 ?0423 02/14/22 ?8850  ?CHOL 68  --   ?TRIG 79  77 46  ?HDL 27*  --   ?CHOLHDL 2.5  --   ?VLDL 16  --   ?[place] 25  --   ? ? ?HgbA1c:  ?Recent Labs  ?Lab 02/11/22 ?0423  ?HGBA1C  6.2*  ? ? ?Urine Drug Screen: No results for input(s): LABOPIA,  COCAINSCRNUR, LABBENZ, AMPHETMU, THCU, LABBARB in the last 168 hours.  ?Alcohol Level No results for input(s): ETH in the last 168 hours. ? ?IMAGING past 24 hours ?DG Chest Port 1 View ? ?Result Date: 02/16/2022 ?CLINICAL DATA:  Right pleural effusion. EXAM: PORTABLE CHEST 1 VIEW COMPARISON:  Portable chest yesterday at 5:38 a.m. FINDINGS: 3:52 a.m., 02/16/2022. Interval removal right IJ line. No pneumothorax is seen. Feeding tube enters the stomach with the intragastric course not included in the exam. The heart is enlarged. Mild perihilar vascular congestion continues to be seen with central and basilar interstitial edema. There are increased perihilar and lower zonal ill-defined opacities consistent with alveolar edema, pneumonia or combination, with small symmetric underlying pleural effusions. There is a stable mediastinum with aortic atherosclerosis. There is thoracic spondylosis. IMPRESSION: 1. Interval removal right IJ central line. 2. Feeding tube enters the stomach with the intragastric course not seen. 3. Perihilar vascular congestion with worsening ill-defined perihilar and lower zonal opacities consistent with edema, pneumonia or combination. Background interstitial edema. Electronically Signed   By: Telford Nab M.D.   On: 02/16/2022 06:25   ? ?PHYSICAL EXAM ?General:  Well-developed, well-nourished patient, ill-appearing ?Respiratory: on , not in distress ?Neurological  ?Eyes closed, do not open to voice. PERRL, blinks to threat bilaterally.  Oculocephalic reflex present.  Slight left facial droop present. Will withdraw LUE and BLE to noxious stimuli, RUE flaccid ? ?ASSESSMENT/PLAN ?Vernon [name] is a 76 y.o. male with history of anemia, arthritis, GERD, DM2, HTN and a-fib on Eliquis presenting with vomiting and incoordiantion on the left side.  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  His Eliquis was reversed with [name] and  he was transferred here.  His neurological status began to worsen and he was intubated.  Repeat CT demonstrated enlarging ICH.  Neurosurgery was consulted and decompressive suboccipital craniectomy was performed on 3/19 by Dr Ellene Route.  3% HTS was started for cerebral edema.. 23.5% HTS bolus will be given as Na is still at 145 despite 3% HTS.  Patient has been extubated and is doing well. ? ?ICH:  left cerebellar ICH s/p suboccipital decompression and hematoma evacuation likely due to Eliquis use and hypertension ?Code Stroke CT head acute 2.2 cm IPH in middle cerebellar peduncle with surrounding edema ?CTA head & neck no LVO or hemodynamically significant stenosis, no evidence of AVM of aneurysm, moderate right vertebral artery stenosis ?Follow up CT 3/18 increased size of left cerebellar IPH with effaced fourth ventricle ?Follow up CT 3/19 s/p left suboccipital craniectomy and cerebellar hematoma evacuation with moderate volume of residual posterior fossa blood ?MRI - 02/12/22 - Left suboccipital craniectomy for hematoma evacuation. Roughly 13 mL residual left cerebellar blood tracking across midline to the right. Cerebellar edema with partial effacement of the 4th ventricle. But basilar cistern patency is adequate, with no ventriculomegaly. Trace intraventricular and subarachnoid hemorrhage.  ?CT 3/20 and 3/22 stable mass effect and no hydrocephalus ?2D Echo EF 55-60% ?LDL 25 ?HgbA1c 6.2 ?VTE prophylaxis - heparin subcu ?Eliquis (apixaban) daily prior to admission, now on No antithrombotic secondary to IPH ?Therapy recommendations:  CIR ?Disposition:  pending ? ?Obstructive hydrocephalus  ?S/p suboccipital decompression and hematoma evacuation ?CT 3/18 demonstrated effacement of fourth ventricle with developing obstructive hydrocephalus ?Suboccipital craniectomy performed by neurosurgery ?Parieto-occipital hemovac drain placed ?CT 3/20 and 3/22 and MRI 3/20 no hydrocephalus ?On keppra post op  for total 14 days  (ends 02/24/22) ? ?Cerebral Edema ?Edema seen surrounding left cerebellar ICH on  CT scan with risk for hydrocephalus ?3% HTS at 75 mL/hr->40cc->off->FW 200Q4 ?23.5% bolus given x 1 ?Na  145->149->151->157->156->158->156->161->162->158-> 157 ?Allow Na trending down gradually ? ?Hypertension ?Hypotensive ?Home meds:  lisinopril 40 mg daily ?stable ?Currently off norepinephrine ?Long-term BP goal normotensive ? ?Hyperlipidemia ?Home meds:  simvastatin 80 mg daily ?LDL 25, goal < 70 ?Hold off statin due to low LDL ? ?Diabetes type II Controlled ?Home meds:  metformin 500 mg BID ?HgbA1c 6.2, goal < 7.0 ?CBGs  ?SSI ? ?Respiratory failure ?Pleural effusion ?Patient intubated due to inability to protect airway ?CCM on board ?Ventilator management per CCM ?S/p right thoracentesis ?Extubated 3/23 ? ?Afib  ?On eliquis PTA ?Now bradycardia  ?No antithrombotics for now given ICH ? ?AKI ?Cre 1.24-1.62-1.73->1.86->1.83->2.03->1.99->1.77->1.66-> 1.61 ?Hyperkalemia K 6.1-5.7-5.6 s/p Lokelma ->5.4 s/p Lokelma->4.5->4.3-> 4.1 ?BMP monitoring ?CCM on board ? 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 ?Other Stroke Risk Factors ?Advanced Age >/= 75  ?Former Cigarette smoker ?Obesity, Body mass index is 33.24 kg/m?., BMI >/= 30 associated with increased stroke risk, recommend weight loss, diet and exercise as appropriate  ? ?Other Active Problems ?Hyperkalemia, K 6.1-5.7-5.6 s/p Lokelma ->5.4 s/p Lokelma->4.5 ?Hypomagnesemia - Magnesium - 1.6->2.5 ->2.3 ? ? ?Hospital day # 6 ? ?[place] , MSN, AGACNP-BC ?[facility] ?See Amion for schedule and pager information ?02/16/2022 1:15 PM ? ?ATTENDING NOTE: ?I reviewed above note and agree with the assessment and plan. Pt was seen and examined.  ? ?No family at bedside.  Patient lying in bed, lethargic, eyes closed,  barely open to voice.  With forced eye opening, not tracking, doll's eyes present.  Mildly blinking to be described bilaterally.  Mild left facial droop.  Left upper extremity no withdrawal to pain.  Left low

## 2022-02-17 DIAGNOSIS — I614 Nontraumatic intracerebral hemorrhage in cerebellum: Secondary | ICD-10-CM | POA: Diagnosis not present

## 2022-02-17 DIAGNOSIS — J9601 Acute respiratory failure with hypoxia: Secondary | ICD-10-CM | POA: Diagnosis not present

## 2022-02-17 DIAGNOSIS — N179 Acute kidney failure, unspecified: Secondary | ICD-10-CM | POA: Diagnosis not present

## 2022-02-17 LAB — CULTURE, RESPIRATORY W GRAM STAIN: Culture: NORMAL

## 2022-02-17 LAB — CBC
HCT: 26.1 % — ABNORMAL LOW (ref 39.0–52.0)
Hemoglobin: 8.4 g/dL — ABNORMAL LOW (ref 13.0–17.0)
MCH: 31 pg (ref 26.0–34.0)
MCHC: 32.2 g/dL (ref 30.0–36.0)
MCV: 96.3 fL (ref 80.0–100.0)
Platelets: 147 10*3/uL — ABNORMAL LOW (ref 150–400)
RBC: 2.71 MIL/uL — ABNORMAL LOW (ref 4.22–5.81)
RDW: 27.7 % — ABNORMAL HIGH (ref 11.5–15.5)
WBC: 5.2 10*3/uL (ref 4.0–10.5)
nRBC: 0.4 % — ABNORMAL HIGH (ref 0.0–0.2)

## 2022-02-17 LAB — BASIC METABOLIC PANEL
Anion gap: 5 (ref 5–15)
BUN: 35 mg/dL — ABNORMAL HIGH (ref 8–23)
CO2: 28 mmol/L (ref 22–32)
Calcium: 9.2 mg/dL (ref 8.9–10.3)
Chloride: 121 mmol/L — ABNORMAL HIGH (ref 98–111)
Creatinine, Ser: 1.47 mg/dL — ABNORMAL HIGH (ref 0.61–1.24)
GFR, Estimated: 49 mL/min — ABNORMAL LOW (ref >60)
Glucose, Bld: 191 mg/dL — ABNORMAL HIGH (ref 70–99)
Potassium: 4 mmol/L (ref 3.5–5.1)
Sodium: 154 mmol/L — ABNORMAL HIGH (ref 135–145)

## 2022-02-17 LAB — GLUCOSE, CAPILLARY
Glucose-Capillary: 179 mg/dL — ABNORMAL HIGH (ref 70–99)
Glucose-Capillary: 185 mg/dL — ABNORMAL HIGH (ref 70–99)
Glucose-Capillary: 208 mg/dL — ABNORMAL HIGH (ref 70–99)
Glucose-Capillary: 170 mg/dL — ABNORMAL HIGH (ref 70–99)
Glucose-Capillary: 165 mg/dL — ABNORMAL HIGH (ref 70–99)

## 2022-02-17 MED ORDER — ALBUTEROL SULFATE (2.5 MG/3ML) 0.083% IN NEBU: 2.5000 mg | INHALATION_SOLUTION | RESPIRATORY_TRACT | Status: DC | PRN

## 2022-02-17 MED ORDER — INSULIN GLARGINE-YFGN 100 UNIT/ML ~~LOC~~ SOLN
30.0000 [IU] | Freq: Every day | SUBCUTANEOUS | Status: DC
  Administered 2022-02-18 – 2022-02-26 (×9): 30 [IU] via SUBCUTANEOUS
  Filled 2022-02-17 (×9): qty 0.3

## 2022-02-17 NOTE — Progress Notes (Signed)
? ?NAME:  Vernon [name], MRN:  [id number], DOB:  Apr 22, 1946, LOS: 7 ?ADMISSION DATE:  02/10/2022, CONSULTATION DATE:  3/18 ?REFERRING MD:  Sarita Haver, CHIEF COMPLAINT:  headache, weakness, nausea  ? ?History of Present Illness:  ?76 y/o male went to [facility] on 3/18 with weakness and nausea, found to have a cerebellar hemorrhage.  Repeat head CT showed expansion. He vomited and developed hypoxemia requiring intubation for airway protection.  He went for an emergent craniotomy on 3/18, hematoma evacuation and drain placement. PCCM consulted for ventilator management.  ? ?Pertinent  Medical History  ?Hypertension ?Anemia ?GERD ?DM2 ?Hiatal hernia ?Atrial fibrillation on eliquis ?Aortic stenosis ?Hypertension ?myelodysplasia ? ?Significant Hospital Events: ?Including procedures, antibiotic start and stop dates in addition to other pertinent events   ?3/18 admission, intubation for airway protection after vomiting, underwent Suboccipital craniectomy and evacuation of cerebellar hemorrhage.  Placement of right parieto-occipital ventriculostomy by Dr. Ellene Route ?3/19 repeat CT head > s/p left suboccipital craniectomy and cerebellar hematoma evacuation with moderate residual volume of posterior fossa blood, improved patency of 4th ventricle, decreased lateral and 3rd ventricle size; EVD in place, otherwise stable CT ?3/20  MRI Brain>>Left suboccipital craniectomy for hematoma evacuation. Roughly 13 mL residual left cerebellar blood tracking across midline to the right. Cerebellar edema with partial effacement of the 4th ventricle. But basilar cistern patency is adequate, with no ventriculomegaly. Sequelae of attempted EVD placement across the splenium of the corpus callosum. Trace intraventricular and subarachnoid hemorrhage. Background mild to moderate nonspecific cerebral white matter signal changes. 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 ?3/21: patient on PSV; thoracentesis for R  pleural effusion; off pressors; sedation switched to cleviprex; hypertonic saline stopped due to hypernatremia; started on unasyn, trach asp sent ?3/22: pulled EVD out; NSG aware and will just continue to monitor; extubated ?3/23: remains on precedex; off levo, transfused 1 unit PRBC, repeat CTH stable ?Interim History / Subjective:  ? ?Awake, alert, following commands, speaking ? ?Objective   ?Blood pressure (!) 143/73, pulse 60, temperature 98.4 ?F (36.9 ?C), temperature source Oral, resp. rate (!) 23, height '5\' 9"'$  (1.753 m), weight 93.5 kg, SpO2 94 %. ?   ?   ? ?Intake/Output Summary (Last 24 hours) at 02/17/2022 0756 ?Last data filed at 02/17/2022 0730 ?Gross per 24 hour  ?Intake 3189.97 ml  ?Output 5025 ml  ?Net -1835.03 ml  ? ?Filed Weights  ? 02/14/22 0500 02/16/22 0500 02/17/22 0500  ?Weight: 100.5 kg 93.8 kg 93.5 kg  ? ? ?Examination: ? ?General:  Resting comfortably in bed ?HENT: suboccipital scar in place, OP clear ?PULM: CTA B, normal effort ?CV: RRR, no mgr ?GI: BS+, soft, nontender ?MSK: normal bulk and tone ?Neuro: awake, alert, no distress, MAEW ? ?3/19 CXR > improving bibasilar infiltrates ? ?Resolved Hospital Problem list   ? ? ?Assessment & Plan:  ?Acute respiratory failure with hypoxemia due inability to protect airway  ?Right sided transudative pleural effusion ?Aspiration pneumonia ?Aspiration precautions ?NPO ?Unasyn 7 days ?Wean off O2 for O2 saturation > 88% ?Pulm toilette ? ?Spontaneous Cerebellar hematoma, s/p evacuation on 3/18 ?Cerebral edema ?Obstructive hydrocephalus ?Acute encephalopathy due to the above ?F/u NSGY, neuro recs ?Continue to monitor neuro exam ?Secondary stroke prevention per neurology/stroke service ?BP goals and glucose goals per stroke service ?Statin, antiplatelet agent per stroke service ?Imaging per stroke service ?PT/OT/SLP ?Keppra per neuro ? ? ?Moderate aortic valve stenosis, moderate MR/TR ?Hypertension ?Hyperlipidemia ?Chronic atrial fibrillation on  eliquis ?Echo 3/19>> EF  55-60%, LV normal function, moderate LV hypertrophy, RV Normal function, Moderately elevated PASP, LA severely dilated, Small pericardial effusion , No tamponade, Mild MV regurg, moderate calcification of the aortic valve. Mild aortic valve stenosis. IVC dilated in size RAP about 15 mm HG ?May need watchman procedure as outpatient ?Tele ?Hold eliquis ?Prn labetalol ? ?DM2 ?Increase semglee again today ?Continue tube feeding coverage ?Continue SSI ? ?Myelodysplasia, received 1 U PRBC in OR 3/18 ?Monitor for bleeding ?Transfuse PRBC for Hgb < 7 gm/dL ? ?AKI> new 3/19, oliguric ?Hypernatremic > improving ?Replace electrolytes as needed ?Avoid nephrotoxic agents ?Monitor BMET and UOP ?Replace electrolytes as needed ? ?Constipation ?Continue senna, miralax ?Continue bowel regimen ? ?Best Practice (right click and "Reselect all SmartList Selections" daily)  ? ?Diet/type: tubefeeds ?DVT prophylaxis: SCD ?GI prophylaxis: N/A ?Lines: Central line and yes and it is still needed ?Foley:  Yes, and it is still needed ?Code Status:  full code ?Last date of multidisciplinary goals of care discussion [per primary service] ? ?Labs   ?CBC: ?Recent Labs  ?Lab 02/10/22 ?1553 02/10/22 ?1956 02/12/22 ?0415 02/12/22 ?7116 02/13/22 ?1012 02/13/22 ?1451 02/14/22 ?0450 02/15/22 ?5790 02/15/22 ?1540 02/16/22 ?0223 02/17/22 ?0331  ?WBC 8.2   < > 10.6*   < > 9.3  --  8.4 4.0 5.0 5.5 5.2  ?NEUTROABS 7.4  --  8.5*  --  7.8*  --   --   --   --   --   --   ?HGB 8.9*   < > 9.0*   < > 7.9*   < > 8.0* 6.9* 7.9* 7.8* 8.4*  ?HCT 28.4*   < > 28.9*   < > 26.9*   < > 26.9* 22.3* 25.8* 25.1* 26.1*  ?MCV 95.6   < > 98.6   < > 101.9*  --  100.0 99.1 99.2 96.2 96.3  ?PLT 163   < > 190   < > 170  --  220 123* 130* 138* 147*  ? < > = values in this interval not displayed.  ? ? ?Basic Metabolic Panel: ?Recent Labs  ?Lab 02/11/22 ?1217 02/11/22 ?1629 02/11/22 ?2234 02/12/22 ?0415 02/12/22 ?3833 02/12/22 ?1601 02/12/22 ?2200 02/13/22 ?1012  02/13/22 ?1416 02/14/22 ?0450 02/14/22 ?0903 02/15/22 ?3832 02/15/22 ?1540 02/15/22 ?2114 02/16/22 ?9191 02/16/22 ?6606 02/17/22 ?0331  ?NA  --  149*   < > 150*   < >  --    < > 161*   < > 158*   < > 158* 158* 158* 156* 157* 154*  ?K  --  5.6*  --  5.2*   < >  --   --  4.5   < > 4.5  --  5.4* 4.3  --  4.1  --  4.0  ?CL  --  123*  --  127*   < >  --   --  >130*  --  >130*  --  >130* >130*  --  127*  --  121*  ?CO2  --  18*  --  19*   < >  --   --  19*  --  19*  --  21* 21*  --  23  --  28  ?GLUCOSE  --  136*  --  156*   < >  --   --  199*  --  237*  --  232* 207*  --  232*  --  191*  ?BUN  --  37*  --  41*   < >  --   --  45*  --  46*  --  48* 44*  --  39*  --  35*  ?CREATININE  --  1.73*  --  1.86*   < >  --   --  2.03*  --  1.99*  --  1.77* 1.66*  --  1.61*  --  1.47*  ?CALCIUM  --  8.0*  --  7.9*   < >  --   --  8.1*  --  8.3*  --  8.3* 8.8*  --  9.1  --  9.2  ?MG 1.6* 1.6*  --  2.5*  --  2.4  --  2.3  --  2.1  --   --   --   --  1.8  --   --   ?PHOS 4.9* 4.6  --  4.4  --  3.6  --  3.4  --   --   --   --   --   --   --   --   --   ? < > = values in this interval not displayed.  ? ?GFR: ?Estimated Creatinine Clearance: 49 mL/min (A) (by C-G formula based on SCr of 1.47 mg/dL (H)). ?Recent Labs  ?Lab 02/15/22 ?8938 02/15/22 ?1540 02/16/22 ?0223 02/17/22 ?0331  ?WBC 4.0 5.0 5.5 5.2  ? ? ?Liver Function Tests: ?Recent Labs  ?Lab 02/10/22 ?1553 02/12/22 ?0415 02/13/22 ?1012 02/13/22 ?1416  ?AST '24 21 21  '$ --   ?ALT '18 19 17  '$ --   ?ALKPHOS 92 72 74  --   ?BILITOT 3.3* 3.2* 2.6*  --   ?PROT 7.2 5.9* 5.8* 5.7*  ?ALBUMIN 3.9 3.3* 3.2*  --   ? ?No results for input(s): LIPASE, AMYLASE in the last 168 hours. ?No results for input(s): AMMONIA in the last 168 hours. ? ?ABG ?   ?Component Value Date/Time  ? PHART 7.338 (L) 02/13/2022 1451  ? PCO2ART 38.0 02/13/2022 1451  ? PO2ART 72 (L) 02/13/2022 1451  ? HCO3 20.2 02/13/2022 1451  ? TCO2 21 (L) 02/13/2022 1451  ? ACIDBASEDEF 5.0 (H) 02/13/2022 1451  ? O2SAT 92 02/13/2022 1451   ?  ? ?Coagulation Profile: ?Recent Labs  ?Lab 02/10/22 ?1553  ?INR 1.5*  ? ? ?Cardiac Enzymes: ?No results for input(s): CKTOTAL, CKMB, CKMBINDEX, TROPONINI in the last 168 hours. ? ?HbA1C: ?Hgb A1c MFr Bld  ?Date

## 2022-02-17 NOTE — Progress Notes (Addendum)
STROKE TEAM PROGRESS NOTE  ? ?INTERVAL HISTORY ?Patient is seen in his room with no family at the bedside.  He has been hemodynamically stable and has had no acute events overnight.  His neurological exam is much improved today. ? ? ?Vitals:  ? 02/17/22 0817 02/17/22 0900 02/17/22 1000 02/17/22 1100  ?BP:  (!) 146/69 137/76 113/72  ?Pulse: 66 61 60 65  ?Resp:  (!) 'Vernon 18 20  '$ ?Temp:      ?TempSrc:      ?SpO2:  91% 96% 91%  ?Weight:      ?Height:      ? ?CBC:  ?Recent Labs  ?Lab 02/12/22 ?0415 02/12/22 ?0958 03/Vernon/23 ?1012 03/Vernon/23 ?1451 02/16/22 ?0223 02/17/22 ?0331  ?WBC 10.6*   < > 9.3   < > 5.5 5.2  ?NEUTROABS 8.5*  --  7.8*  --   --   --   ?HGB 9.0*   < > 7.9*   < > 7.8* 8.4*  ?HCT 28.9*   < > 26.9*   < > 25.1* 26.1*  ?MCV 98.6   < > 101.9*   < > 96.2 96.3  ?PLT 190   < > 170   < > 138* 147*  ? < > = values in this interval not displayed.  ? ? ?Basic Metabolic Panel:  ?Recent Labs  ?Lab 02/12/22 ?1601 02/12/22 ?2200 03/Vernon/23 ?1012 03/Vernon/23 ?1416 02/14/22 ?0450 02/14/22 ?1245 02/16/22 ?8099 02/16/22 ?8338 02/17/22 ?0331  ?NA  --    < > 161*   < > 158*   < > 156* 157* 154*  ?K  --   --  4.5   < > 4.5   < > 4.1  --  4.0  ?CL  --   --  >130*  --  >130*   < > 127*  --  121*  ?CO2  --   --  19*  --  19*   < > 23  --  28  ?GLUCOSE  --   --  199*  --  237*   < > 232*  --  191*  ?BUN  --   --  45*  --  46*   < > 39*  --  35*  ?CREATININE  --   --  2.03*  --  1.99*   < > 1.61*  --  1.47*  ?CALCIUM  --   --  8.1*  --  8.3*   < > 9.1  --  9.2  ?MG 2.4  --  2.3  --  2.1  --  1.8  --   --   ?PHOS 3.6  --  3.4  --   --   --   --   --   --   ? < > = values in this interval not displayed.  ? ? ?Lipid Panel:  ?Recent Labs  ?Lab 02/11/22 ?0423 02/14/22 ?2505  ?CHOL 68  --   ?TRIG 79  77 46  ?HDL 27*  --   ?CHOLHDL 2.5  --   ?VLDL 16  --   ?[place] 25  --   ? ? ?HgbA1c:  ?Recent Labs  ?Lab 02/11/22 ?0423  ?HGBA1C 6.2*  ? ? ?Urine Drug Screen: No results for input(s): LABOPIA, COCAINSCRNUR, LABBENZ, AMPHETMU, THCU, LABBARB in the  last 168 hours.  ?Alcohol Level No results for input(s): ETH in the last 168 hours. ? ?IMAGING past 24 hours ?No results found. ? ?PHYSICAL EXAM ?General:  Well-developed, well-nourished patient, ill-appearing ?  Respiratory: on [place], not in distress ? ?NEURO:  ?Mental Status: AA&Ox3  ?Speech/Language: speech is without dysarthria or aphasia.  Naming, repetition, fluency, and comprehension intact. ? ?Cranial Nerves:  ?II: PERRL. Visual fields full.  ?III, IV, VI: Right gaze deviation, unable to cross midline  ?V: Sensation is intact to light touch and symmetrical to face.  ?VII: Smile is symmetrical. Able to puff cheeks and raise eyebrows.  ?VIII: hearing intact to voice. ?IX, X: Phonation is normal.  ?XII: tongue is midline without fasciculations. ?Motor: 5/5 strength to RUE and BLE, LUE flaccid ?Tone: is normal and bulk is normal ?Sensation- Intact to light touch bilaterally.   ?Gait- deferred ? ? ?ASSESSMENT/PLAN ?Vernon [name] is a 76 y.o. male with history of anemia, arthritis, GERD, DM2, HTN and a-fib on Eliquis presenting with vomiting and incoordiantion on the left side.  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  His Eliquis was reversed with [name] and he was transferred here.  His neurological status began to worsen and he was intubated.  Repeat CT demonstrated enlarging ICH.  Neurosurgery was consulted and decompressive suboccipital craniectomy was performed on 3/19 by Dr Ellene Route.  3% HTS was started for cerebral edema.. 23.5% HTS bolus will be given as Na is still at 145 despite 3% HTS.  Patient has been extubated and is doing well. ? ?ICH:  left cerebellar ICH s/p suboccipital decompression and hematoma evacuation likely due to Eliquis use and hypertension ?Code Stroke CT head acute 2.2 cm IPH in middle cerebellar peduncle with surrounding edema ?CTA head & neck no LVO or hemodynamically significant stenosis, no evidence of AVM of aneurysm, moderate right vertebral artery stenosis ?Follow up CT 3/18  increased size of left cerebellar IPH with effaced fourth ventricle ?Follow up CT 3/19 s/p left suboccipital craniectomy and cerebellar hematoma evacuation with moderate volume of residual posterior fossa blood ?MRI - 02/12/22 - Left suboccipital craniectomy for hematoma evacuation. Roughly 13 mL residual left cerebellar blood tracking across midline to the right. Cerebellar edema with partial effacement of the 4th ventricle. But basilar cistern patency is adequate, with no ventriculomegaly. Trace intraventricular and subarachnoid hemorrhage.  ?CT 3/20 and 3/22 stable mass effect and no hydrocephalus ?2D Echo EF 55-60% ?LDL 25 ?HgbA1c 6.2 ?VTE prophylaxis - heparin subcu ?Eliquis (apixaban) daily prior to admission, now on No antithrombotic secondary to IPH ?Therapy recommendations:  CIR ?Disposition:  pending ? ?Obstructive hydrocephalus  ?S/p suboccipital decompression and hematoma evacuation ?CT 3/18 demonstrated effacement of fourth ventricle with developing obstructive hydrocephalus ?Suboccipital craniectomy performed by neurosurgery ?Parieto-occipital hemovac drain placed ?CT 3/20 and 3/22 and MRI 3/20 no hydrocephalus ?On keppra post op  for total 14 days (ends 02/24/22) ? ?Cerebral Edema ?Edema seen surrounding left cerebellar ICH on CT scan with risk for hydrocephalus ?3% HTS at 75 mL/hr->40cc->off->FW 200Q4 ?23.5% bolus given x 1 ?Na  145->149->151->157->156->158->156->161->162->158-> 157-> 154 ?Allow Na trending down gradually ? ?Hypertension ?Hypotensive ?Home meds:  lisinopril 40 mg daily ?stable ?Currently off norepinephrine ?Long-term BP goal normotensive ? ?Hyperlipidemia ?Home meds:  simvastatin 80 mg daily ?LDL 25, goal < 70 ?Hold off statin due to low LDL ? ?Diabetes type II Controlled ?Home meds:  metformin 500 mg BID ?HgbA1c 6.2, goal < 7.0 ?CBGs  ?SSI ? ?Respiratory failure ?Pleural effusion ?Patient intubated due to inability to protect airway ?CCM on board ?Ventilator management per CCM ?S/p  right thoracentesis ?Extubated 3/23 ? ?Afib  ?On eliquis PTA ?Now bradycardia  ?No antithrombotics for now given  ICH ? ?AKI ?Cre 1.24-1.62-1.73->1.86->1.83->2.03->1.99->1.77->1.66-> 1.61-> 1.47 ?Hyperkalemia K 6.1-5.7-5.6 s/p Lokelma ->5.4 s/p Lokelma->4.5->4.3-> 4.1-> resolved ?BMP monitoring ?CCM on board ? 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 ?Other Stroke Risk Factors ?Advanced Age >/= 29  ?Former Cigarette smoker ?Obesity, Body mass index is 30.44 kg/m?., BMI >/= 30 associated with increased stroke risk, recommend weight loss, diet and exercise as appropriate  ? ?Other Active Problems ?Hyperkalemia, K 6.1-5.7-5.6 s/p Lokelma ->5.4 s/p Lokelma->4.5 ?Hypomagnesemia - Magnesium - 1.6->2.5 ->2.3 ? ? ?Hospital day # 7 ? ?[place] , MSN, AGACNP-BC ?[facility] ?See Amion for schedule and pager information ?02/17/2022 11:52 AM ? ?ATTENDING ATTESTATION: ? ?Vernon [name] history of A-fib on Eliquis presenting with left cerebellar intracranial hemorrhage.  Eliquis was reversed with [name] despite this he had worsening edema and suboccipital craniotomy was performed on 3/19.  Hypertonic is stopped sodium is trending down. Free water stopped today, will let it decrease gradually.  He is doing markedly better today in terms of being awake alert and responsive.  He can answer questions tell us his name where he is currently the year and follow commands.  He has a right gaze preference and does not cross midline but no visual field cut.  Left upper extremity paralysis.  No drift in the bilateral lower extremities or right upper extremity.  We will have speech reevaluate and start a diet if recommended.  Due to his high risk of bleeding and risk of stroke with A-fib will consider Watchman device in the future  once he stabilizes. ? ?Dr. Reeves Forth evaluated pt independently, reviewed imaging, chart, labs. Discussed and formulated plan with the APP. Please see APP note above for details.    ?  ?This patient is critically

## 2022-02-17 NOTE — Progress Notes (Signed)
Pharmacy Antibiotic Note ? ?Vernon [name] is a 76 y.o. male admitted on 02/10/2022 with asp pna.  Pharmacy has been consulted for Unasyn dosing. ? ?Plan: ?Unasyn 3gm IV q6h - noted end date of 3/26 ?No further dose adjustments should be needed ? ?Height: '5\' 9"'$  (175.3 cm) ?Weight: 93.5 kg (206 lb 2.1 oz) ?IBW/kg (Calculated) : 70.7 ? ?Temp (24hrs), Avg:98.7 ?F (37.1 ?C), Min:97.6 ?F (36.4 ?C), Max:99.4 ?F (37.4 ?C) ? ?Recent Labs  ?Lab 02/14/22 ?0450 02/15/22 ?8466 02/15/22 ?1540 02/16/22 ?0223 02/17/22 ?0331  ?WBC 8.4 4.0 5.0 5.5 5.2  ?CREATININE 1.99* 1.77* 1.66* 1.61* 1.47*  ?  ?Estimated Creatinine Clearance: 49 mL/min (A) (by C-G formula based on SCr of 1.47 mg/dL (H)).   ? ?No Known Allergies ? ?Antimicrobials this admission: ?unasyn 3/21 >> 3/26 ? ?Microbiology results: ?3/21- thoracentesis Cx - neg  ?3/22- tracheal aspirate - neg ? ?Thank you for allowing pharmacy to be a part of this patient?s care. ? ?Sherlon Handing, PharmD, BCPS ?Please see amion for complete clinical pharmacist phone list ?02/17/2022 1:52 PM ? ?

## 2022-02-17 NOTE — Progress Notes (Signed)
Occupational Therapy Treatment ?Patient Details ?Name: Vernon [name] ?MRN: [id number] ?DOB: 1946/04/26 ?Today's Date: 02/17/2022 ? ? ?History of present illness Pt. is a 76 y.o. male presenting to [facility] on 3/18 with worseing L side coordination along with N/V. CTH demonstrated a 2,2 cm L cerebellar peduncle ICH. He had an epsidoe fo emisis and developed hypoxemia, requiring intubation as well as undergoing an emergent craniotomy on 3/18; hematoma evacuation and drain placement. PMH significant for anemia, arthritis, GERD, DM2, GERD, HTN, afibb on Eliquis. ?  ?OT comments ? Patient with improved level of alertness, but remains lethargic.  Able to further assess vision and ROM to all extremities this session.  Patient placed in chair position, and able to perform AA ROM to R UE and PROM to LUE.  Worked on head turns and looking to the left visual field.  Patient blinks to threat, but does appear to have a combination of inattention and possible missing 30 degrees of peripheral vision.  OT will continue efforts, and AIR continues to be recommended wihen patient is cleared medically.     ? ?Recommendations for follow up therapy are one component of a multi-disciplinary discharge planning process, led by the attending physician.  Recommendations may be updated based on patient status, additional functional criteria and insurance authorization. ?   ?Follow Up Recommendations ? Acute inpatient rehab (3hours/day)  ?  ?Assistance Recommended at Discharge Frequent or constant Supervision/Assistance  ?Patient can return home with the following ? Two people to help with walking and/or transfers;Two people to help with bathing/dressing/bathroom;Assistance with cooking/housework;Direct supervision/assist for medications management;Direct supervision/assist for financial management;Assist for transportation;Help with stairs or ramp for entrance ?  ?Equipment Recommendations ?    ?  ?Recommendations for Other Services Rehab consult ? ?   ?Precautions / Restrictions Precautions ?Precautions: Fall ?Precaution Comments: [name] ?Restrictions ?Weight Bearing Restrictions: No  ? ? ?  ? ?Mobility Bed Mobility ?  ?  ?  ?  ?  ?  ?  ?  ?  ? ?Transfers ?  ?  ?  ?  ?  ?  ?  ?  ?  ?  ?  ?  ?Balance   ?  ?  ?  ?  ?  ?  ?  ?  ?  ?  ?  ?  ?  ?  ?  ?  ?  ?  ?   ? ?ADL either performed or assessed with clinical judgement  ? ?ADL   ?Eating/Feeding: NPO ?  ?  ?  ?  ?  ?  ?  ?  ?  ?  ?  ?  ?  ?  ?  ?  ?  ?Functional mobility during ADLs: Total assistance;+2 for physical assistance;+2 for safety/equipment ?General ADL Comments: pt is total A for all aspects of care at bed level due to level of arousal ?  ? ?Extremity/Trunk Assessment Upper Extremity Assessment ?Upper Extremity Assessment: Generalized weakness ?RUE Deficits / Details: 2-5 grossly to RUE.  Weak grasp, unable to perform hand to mouth, minimal wrist flexion and forearm supination.  Prior RCR. ?RUE Sensation: WNL ?RUE Coordination: decreased fine motor;decreased gross motor ?LUE Deficits / Details: Patient with trace shoulder and bicep, otherwise flaccid. ?LUE Sensation: WNL ?LUE Coordination: decreased fine motor;decreased gross motor ?  ?Lower Extremity Assessment ?Lower Extremity Assessment: Defer to PT evaluation ?  ?Cervical / Trunk Assessment ?Cervical / Trunk Assessment: Kyphotic ?  ? ?Vision   ?Vision Assessment?: Yes ?Alignment/Gaze Preference: Gaze  right ?Tracking/Visual Pursuits: Unable to hold eye position out of midline;Requires cues, head turns, or add eye shifts to track;Decreased smoothness of horizontal tracking ?Visual Fields: Left visual field deficit ?  ?Perception Perception ?Perception: Not tested ?  ?Praxis Praxis ?Praxis: Not tested ?  ? ?Cognition Arousal/Alertness: Lethargic ?Behavior During Therapy: Flat affect ?Overall Cognitive Status: Impaired/Different from baseline ?  ?  ?  ?  ?  ?  ?  ?  ?  ?Orientation Level: Person, Place, Situation ?  ?  ?Following Commands: Follows one  step commands inconsistently ?  ?  ?Problem Solving: Slow processing, Decreased initiation, Requires verbal cues, Requires tactile cues ?  ?  ?  ?   ?Exercises General Exercises - Upper Extremity ?Shoulder Flexion: AAROM, PROM, Both, 10 reps ?Shoulder Extension: PROM, AAROM, Both, 10 reps ?Elbow Flexion: PROM, AAROM, Both, 10 reps ?Elbow Extension: PROM, AAROM, Both, 10 reps ?Wrist Flexion: PROM, AAROM, Both, 10 reps ?Wrist Extension: PROM, AAROM, Both, 10 reps ?Digit Composite Flexion: PROM, AAROM, Both, 10 reps ?Composite Extension: PROM, AAROM, Both, 10 reps ? ?  ?Shoulder Instructions   ? ? ?  ?General Comments    ? ? ?Pertinent Vitals/ Pain       Pain Assessment ?Pain Assessment: No/denies pain ?Pain Intervention(s): Monitored during session ? ?   ?  ?  ?  ?  ?  ?  ?  ?  ?  ?  ?  ?  ?  ?  ?  ?  ?  ?  ? ?  ?    ?  ?  ?  ?   ? ?Frequency ? Min 2X/week  ? ? ? ? ?  ?Progress Toward Goals ? ?OT Goals(current goals can now be found in the care plan section) ? Progress towards OT goals: Progressing toward goals ? ?Acute Rehab OT Goals ?OT Goal Formulation: Patient unable to participate in goal setting ?Time For Goal Achievement: 02/28/22 ?Potential to Achieve Goals: Fair  ?Plan Discharge plan remains appropriate   ? ?Co-evaluation ? ? ?   ?  ?  ?  ?  ? ?  ?AM-PAC OT "6 Clicks" Daily Activity     ?Outcome Measure ? ? Help from another person eating meals?: Total ?Help from another person taking care of personal grooming?: Total ?Help from another person toileting, which includes using toliet, bedpan, or urinal?: Total ?Help from another person bathing (including washing, rinsing, drying)?: Total ?Help from another person to put on and taking off regular upper body clothing?: Total ?Help from another person to put on and taking off regular lower body clothing?: Total ?6 Click Score: 6 ? ?  ?End of Session Equipment Utilized During Treatment: Oxygen ? ?OT Visit Diagnosis: Repeated falls (R29.6);Other abnormalities of  gait and mobility (R26.89);Unsteadiness on feet (R26.81);Muscle weakness (generalized) (M62.81);Pain;Other (comment) ?  ?Activity Tolerance Patient limited by lethargy ?  ?Patient Left in bed;with call bell/phone within reach;with family/visitor present ?  ?Nurse Communication Mobility status ?  ? ?   ? ?Time: 6629-4765 ?OT Time Calculation (min): 18 min ? ?Charges: OT General Charges ?$OT Visit: 1 Visit ?OT Treatments ?$Therapeutic Exercise: 8-22 mins ? ?02/17/2022 ? ?RP, OTR/L ? ?Acute Rehabilitation Services ? ?Office:  [phone] ? ? ?Federick Levene D Laylah Riga ?02/17/2022, 4:36 PM ?

## 2022-02-17 NOTE — Progress Notes (Signed)
?  NEUROSURGERY PROGRESS NOTE  ? ?No issues overnight.  ? ?EXAM:  ?BP 115/68 (BP Location: Right Arm)   Pulse 65   Temp 97.6 ?F (36.4 ?C) (Axillary)   Resp 20   Ht '5\' 9"'$  (1.753 m)   Wt 93.5 kg   SpO2 91%   BMI 30.44 kg/m?  ? ?Awake, alert, oriented x 3 ?Speech fluent ?CN grossly intact  ?Follows commands, good strength RUE/BLE ?> antigravity LUE, 2-3/5 grip ?Wound c/d/I, no leak ? ?IMPRESSION:  ?76 y.o. male s/p suboccipital decompression for cerebellar hemorrhage, significantly improved today ? ?PLAN: ?- Cont supportive care ?- Repeat PT/OT and SLP evals ? ? ?Consuella Lose, MD ?[facility]  ? ?

## 2022-02-18 ENCOUNTER — Inpatient Hospital Stay (HOSPITAL_COMMUNITY): Payer: Medicare Other

## 2022-02-18 DIAGNOSIS — I614 Nontraumatic intracerebral hemorrhage in cerebellum: Secondary | ICD-10-CM | POA: Diagnosis not present

## 2022-02-18 LAB — CBC WITH DIFFERENTIAL/PLATELET
Abs Immature Granulocytes: 0 10*3/uL (ref 0.00–0.07)
Basophils Absolute: 0 10*3/uL (ref 0.0–0.1)
Basophils Relative: 1 %
Eosinophils Absolute: 0.5 10*3/uL (ref 0.0–0.5)
Eosinophils Relative: 10 %
HCT: 28.8 % — ABNORMAL LOW (ref 39.0–52.0)
Hemoglobin: 9.1 g/dL — ABNORMAL LOW (ref 13.0–17.0)
Lymphocytes Relative: 24 %
Lymphs Abs: 1.2 10*3/uL (ref 0.7–4.0)
MCH: 30.4 pg (ref 26.0–34.0)
MCHC: 31.6 g/dL (ref 30.0–36.0)
MCV: 96.3 fL (ref 80.0–100.0)
Monocytes Absolute: 0.1 10*3/uL (ref 0.1–1.0)
Monocytes Relative: 2 %
Neutro Abs: 3.1 10*3/uL (ref 1.7–7.7)
Neutrophils Relative %: 63 %
Platelets: 149 10*3/uL — ABNORMAL LOW (ref 150–400)
RBC: 2.99 MIL/uL — ABNORMAL LOW (ref 4.22–5.81)
RDW: 26.7 % — ABNORMAL HIGH (ref 11.5–15.5)
WBC: 4.9 10*3/uL (ref 4.0–10.5)
nRBC: 0 /100 WBC
nRBC: 0.6 % — ABNORMAL HIGH (ref 0.0–0.2)

## 2022-02-18 LAB — GLUCOSE, CAPILLARY
Glucose-Capillary: 147 mg/dL — ABNORMAL HIGH (ref 70–99)
Glucose-Capillary: 147 mg/dL — ABNORMAL HIGH (ref 70–99)
Glucose-Capillary: 152 mg/dL — ABNORMAL HIGH (ref 70–99)
Glucose-Capillary: 154 mg/dL — ABNORMAL HIGH (ref 70–99)
Glucose-Capillary: 170 mg/dL — ABNORMAL HIGH (ref 70–99)
Glucose-Capillary: 174 mg/dL — ABNORMAL HIGH (ref 70–99)
Glucose-Capillary: 179 mg/dL — ABNORMAL HIGH (ref 70–99)

## 2022-02-18 LAB — BASIC METABOLIC PANEL
Anion gap: 4 — ABNORMAL LOW (ref 5–15)
BUN: 37 mg/dL — ABNORMAL HIGH (ref 8–23)
CO2: 26 mmol/L (ref 22–32)
Calcium: 9.2 mg/dL (ref 8.9–10.3)
Chloride: 118 mmol/L — ABNORMAL HIGH (ref 98–111)
Creatinine, Ser: 1.23 mg/dL (ref 0.61–1.24)
GFR, Estimated: >60 mL/min (ref >60)
Glucose, Bld: 173 mg/dL — ABNORMAL HIGH (ref 70–99)
Potassium: 4.7 mmol/L (ref 3.5–5.1)
Sodium: 148 mmol/L — ABNORMAL HIGH (ref 135–145)

## 2022-02-18 MED ORDER — QUETIAPINE FUMARATE 50 MG PO TABS
50.0000 mg | ORAL_TABLET | Freq: Every day | ORAL | Status: DC
  Administered 2022-02-18 – 2022-02-19 (×2): 50 mg
  Filled 2022-02-18 (×2): qty 1

## 2022-02-18 MED ORDER — LEVETIRACETAM 100 MG/ML PO SOLN
500.0000 mg | Freq: Two times a day (BID) | ORAL | Status: DC
  Administered 2022-02-18 – 2022-02-26 (×17): 500 mg
  Filled 2022-02-18 (×18): qty 5

## 2022-02-18 MED ORDER — LEVETIRACETAM 500 MG PO TABS: 500.0000 mg | ORAL_TABLET | Freq: Two times a day (BID) | ORAL | Status: DC

## 2022-02-18 NOTE — Progress Notes (Addendum)
STROKE TEAM PROGRESS NOTE  ? ?INTERVAL HISTORY ?Move out of ICU today - move to hospitalist service ?Speech therapy to re eval today or tomorrow  ?Labs ordered for the morning, todays labs are still pending ?Neurologically and hemodynamically stable. ? ? ?Vitals:  ? 02/18/22 0600 02/18/22 0700 02/18/22 0730 02/18/22 0800  ?BP: (!) 150/85 (!) 164/83 (!) 141/90 (!) 140/92  ?Pulse: 62 76 66 63  ?Resp: (!) 24 (!) 32 19 (!) 21  ?Temp:      ?TempSrc:      ?SpO2: 95% 95% 97% 96%  ?Weight:      ?Height:      ? ?CBC:  ?Recent Labs  ?Lab 02/12/22 ?0415 02/12/22 ?0958 02/13/22 ?1012 02/13/22 ?1451 02/16/22 ?0223 02/17/22 ?0331  ?WBC 10.6*   < > 9.3   < > 5.5 5.2  ?NEUTROABS 8.5*  --  7.8*  --   --   --   ?HGB 9.0*   < > 7.9*   < > 7.8* 8.4*  ?HCT 28.9*   < > 26.9*   < > 25.1* 26.1*  ?MCV 98.6   < > 101.9*   < > 96.2 96.3  ?PLT 190   < > 170   < > 138* 147*  ? < > = values in this interval not displayed.  ? ? ?Basic Metabolic Panel:  ?Recent Labs  ?Lab 02/12/22 ?1601 02/12/22 ?2200 02/13/22 ?1012 02/13/22 ?1416 02/14/22 ?0450 02/14/22 ?5284 02/16/22 ?1324 02/16/22 ?4010 02/17/22 ?0331  ?NA  --    < > 161*   < > 158*   < > 156* 157* 154*  ?K  --   --  4.5   < > 4.5   < > 4.1  --  4.0  ?CL  --   --  >130*  --  >130*   < > 127*  --  121*  ?CO2  --   --  19*  --  19*   < > 23  --  28  ?GLUCOSE  --   --  199*  --  237*   < > 232*  --  191*  ?BUN  --   --  45*  --  46*   < > 39*  --  35*  ?CREATININE  --   --  2.03*  --  1.99*   < > 1.61*  --  1.47*  ?CALCIUM  --   --  8.1*  --  8.3*   < > 9.1  --  9.2  ?MG 2.4  --  2.3  --  2.1  --  1.8  --   --   ?PHOS 3.6  --  3.4  --   --   --   --   --   --   ? < > = values in this interval not displayed.  ? ? ?Lipid Panel:  ?Recent Labs  ?Lab 02/14/22 ?2725  ?TRIG 46  ? ? ?HgbA1c:  ?No results for input(s): HGBA1C in the last 168 hours. ? ?Urine Drug Screen: No results for input(s): LABOPIA, COCAINSCRNUR, LABBENZ, AMPHETMU, THCU, LABBARB in the last 168 hours.  ?Alcohol Level No results for  input(s): ETH in the last 168 hours. ? ?IMAGING past 24 hours ?No results found. ? ?PHYSICAL EXAM ?General:  Well-developed, well-nourished patient, ill-appearing ?Respiratory: on [place], not in distress ? ?NEURO:  ?Mental Status: AA&Ox3  ?Speech/Language: speech is without dysarthria or aphasia.  Naming, repetition, fluency, and comprehension intact. ? ?Cranial Nerves:  ?  II: PERRL. Visual fields full.  ?III, IV, VI: Right gaze deviation, able  to cross midline briefly.  ?V: Sensation is intact to light touch and symmetrical to face.  ?VII: Smile is symmetrical. Able to puff cheeks and raise eyebrows.  ?VIII: hearing intact to voice. ?IX, X: Phonation is normal.  ?XII: tongue is midline without fasciculations. ?Motor: 5/5 strength to RUE and BLE, LUE flaccid ?Tone: is normal and bulk is normal ?Sensation- Intact to light touch bilaterally.   ?Gait- deferred ? ? ?ASSESSMENT/PLAN ?Mr. DEON DUER is a 76 y.o. male with history of anemia, arthritis, GERD, DM2, HTN and a-fib on Eliquis presenting with vomiting and incoordiantion on the left side.  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  His Eliquis was reversed with [name] and he was transferred here.  His neurological status began to worsen and he was intubated.  Repeat CT demonstrated enlarging ICH.  Neurosurgery was consulted and decompressive suboccipital craniectomy was performed on 3/19 by Dr Ellene Route.  3% HTS was started for cerebral edema. Patient has been extubated since 3/23 and is doing well. Repeat head CT stable, off precedex.  ? ?ICH:  left cerebellar ICH s/p suboccipital decompression and hematoma evacuation likely due to Eliquis use and hypertension ?Code Stroke CT head acute 2.2 cm IPH in middle cerebellar peduncle with surrounding edema ?CTA head & neck no LVO or hemodynamically significant stenosis, no evidence of AVM of aneurysm, moderate right vertebral artery stenosis ?Follow up CT 3/18 increased size of left cerebellar IPH with effaced fourth  ventricle ?Follow up CT 3/19 s/p left suboccipital craniectomy and cerebellar hematoma evacuation with moderate volume of residual posterior fossa blood ?MRI - 02/12/22 - Left suboccipital craniectomy for hematoma evacuation. Roughly 13 mL residual left cerebellar blood tracking across midline to the right. Cerebellar edema with partial effacement of the 4th ventricle. But basilar cistern patency is adequate, with no ventriculomegaly. Trace intraventricular and subarachnoid hemorrhage.  ?CT 3/20 and 3/22 stable mass effect and no hydrocephalus ?2D Echo EF 55-60% ?LDL 25 ?HgbA1c 6.2 ?VTE prophylaxis - heparin subcu ?Eliquis (apixaban) daily prior to admission, now on No antithrombotic secondary to IPH ?Therapy recommendations:  CIR ?Disposition:  pending ? ?Obstructive hydrocephalus  ?S/p suboccipital decompression and hematoma evacuation ?CT 3/18 demonstrated effacement of fourth ventricle with developing obstructive hydrocephalus ?Suboccipital craniectomy performed by neurosurgery ?Parieto-occipital hemovac drain placed ?CT 3/20 and 3/22 and MRI 3/20 no hydrocephalus ?On keppra post op  for total 14 days (ends 02/24/22) ? ?Cerebral Edema ?Edema seen surrounding left cerebellar ICH on CT scan with risk for hydrocephalus ?3% HTS at 75 mL/hr->40cc->off->FW 200Q4 ?23.5% bolus given x 1 ?Na  145->149->151->157->156->158->156->161->162->158-> 157-> 154 ?Allow Na trending down gradually ? ?Hypertension ?Hypotensive ?Home meds:  lisinopril 40 mg daily ?stable ?Currently off norepinephrine ?Long-term BP goal normotensive ? ?Hyperlipidemia ?Home meds:  simvastatin 80 mg daily ?LDL 25, goal < 70 ?Hold off statin due to low LDL ? ?Diabetes type II Controlled ?Home meds:  metformin 500 mg BID ?HgbA1c 6.2, goal < 7.0 ?CBGs  ?SSI ? ?Respiratory failure ?Pleural effusion ?Patient intubated due to inability to protect airway after aspiration ?CCM on board ?Ventilator management per CCM ?S/p right thoracentesis 3/21 ?Extubated  3/23 ? ?Afib  ?On eliquis PTA ?Now bradycardia  ?No antithrombotics for now given ICH ? ?AKI ?Cre 1.24-1.62-1.73->1.86->1.83->2.03->1.99->1.77->1.66-> 1.61-> 1.47 ?Hyperkalemia K 6.1-5.7-5.6 s/p Lokelma ->5.4 s/p Lokelma->4.5->4.3-> 4.1-> resolved ?BMP monitoring ?CCM on board ? 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  Hb 8.9->9.5->8.7->7.9->6.9 PRBC->7.9-> 7.8-> 8.4 ?WBC 11.0->9.3->4.0->5.0-> stable ?Platelet 190->207->220->123->130-> 138-> 147 ?CBC monitoring ? ?Other Stroke Risk Factors ?Advanced Age >/= 8  ?Former Cigarette smoker ?Obesity, Body mass index is 30.44 kg/m?., BMI >/= 30 associated with increased stroke risk, recommend weight loss, diet and exercise as appropriate  ? ?Other Active Problems ?Hyperkalemia, K 6.1-5.7-5.6 s/p Lokelma ->5.4 s/p Lokelma->4.5 ?Hypomagnesemia - Magnesium - 1.6->2.5 ->2.3 ? ? ?Hospital day # 8 ? ?Patient seen and examined by NP/APP with MD. MD to update note as needed.  ? ?ATTENDING ATTESTATION: ?76 year old gentleman history of A-fib on Eliquis presenting with left cerebellar intracranial hemorrhage.  Eliquis was reversed with [name] despite this he had worsening edema and suboccipital craniotomy was performed on 3/19.  Hypertonic is stopped sodium is trending down. Free water stopped. Continues to do well. He has a right gaze preference but no visual field cut.  Left upper extremity paralysis.  No drift in the bilateral lower extremities or right upper extremity.  speech asked to reval yesterday, will ask again today so we can start a diet.  Transfer out to floor today. Change keppra to PO. Due to his high risk of bleeding and risk of stroke with A-fib will consider Watchman device in the future once he stabilizes. ? ?Discussed with Dr. Lake Bells, CCM ?  ?Dr. Reeves Forth evaluated pt independently, reviewed imaging, chart, labs. Discussed and formulated plan with the APP. Please see APP note above for details.    ?  ?This patient is critically  ill due to respiratory distress, ICH, cereberal edema/compression and hydrocephalus s/p suboccipital craniotomy  and at significant risk of neurological worsening, death form heart failure, respiratory failure, r

## 2022-02-18 NOTE — Progress Notes (Signed)
Speech Language Pathology Treatment: Dysphagia  ?Patient Details ?Name: Vernon [name] ?MRN: [id number] ?DOB: 1946/09/26 ?Today's Date: 02/18/2022 ?Time: 6387-5643 ?SLP Time Calculation (min) (ACUTE ONLY): 20 min ? ?Assessment / Plan / Recommendation ?Clinical Impression ? Patient seen by SLP for skilled treatment session focused on dysphagia goals. SLP was alerted to this patient initially by PT who reported patient was much more alert than he has been and also patient requesting something to drink. Patient's RN also notified SLP that attending MD was requesting SLP see patient to determine if ready for PO's. Patient was awake and alert, no family in room. SLP observed him with PO intake of thin liquids (water), nectar thick and honey thick juices. With initial sip of water, patient with anterior spillage on right side of mouth and immediate cough response. He presented with suspected delayed swallow initiation and delayed oral transit with nectar thick and honey thick liquids, however no overt s/s aspiration or penetration (no immediate or delayed cough or throat clearing). At this time, SLP is recommending patient be upgraded to full liquids (nectar thick). An objective swallow study (MBS) likely will be needed to fully assess patient's swallow function. ? ?  ?HPI HPI: Pt is a 76 y.o. male who presented to the ED on 3/18 with worseing L side coordination and N/V. 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 Surrounding edema with mild effacement of the fourth ventricle. Pt had an epsidoe fo emesis and developed hypoxemia, requiring intubation for airway protection. ETT 3/18-3/22. Pt s/p emergent craniotomy on 3/18, hematoma evacuation and drain placement. [name] placed 3/20. PMH: anemia, arthritis, GERD, DM2, GERD, HTN, afibb on Eliquis. ?  ?   ?SLP Plan ? Continue with current plan of care ? ?  ?   ?Recommendations for follow up therapy are one component of a multi-disciplinary discharge planning process, led by the attending physician.  Recommendations may be updated based on patient status, additional functional criteria and insurance authorization. ?  ? ?Recommendations  ?Diet recommendations: Nectar-thick liquid;Other(comment) (full liquids) ?Medication Administration: Via alternative means ?Supervision: Full supervision/cueing for compensatory strategies;Trained caregiver to feed patient ?Compensations: Slow rate;Small sips/bites ?Postural Changes and/or Swallow Maneuvers: Seated upright 90 degrees  ?   ?    ?   ? ? ? ? Follow Up Recommendations: Other (comment) (TBD) ?Assistance recommended at discharge: Frequent or constant Supervision/Assistance ?SLP Visit Diagnosis: Dysphagia, unspecified (R13.10) ?Plan: Continue with current plan of care ? ? ? ? ?  ?  ? ?Sonia Baller, MA, CCC-SLP ?Speech Therapy ? ?

## 2022-02-18 NOTE — Progress Notes (Signed)
?  NEUROSURGERY PROGRESS NOTE  ? ?No issues overnight. Pt without complaints. ? ?EXAM:  ?BP (!) 142/69 (BP Location: Right Arm)   Pulse 61   Temp 98.3 ?F (36.8 ?C) (Oral)   Resp 20   Ht '5\' 9"'$  (1.753 m)   Wt 93.5 kg   SpO2 96%   BMI 30.44 kg/m?  ? ?Awake, alert, oriented x 3 ?Speech fluent, dysarthric ?CN grossly intact  ?Follows commands, good strength RUE/BLE ?> antigravity LUE, 2-3/5 grip ?Wound c/d/I, no leak ? ?IMPRESSION:  ?76 y.o. male s/p suboccipital decompression for cerebellar hemorrhage, Improved over the weekend. ? ?PLAN: ?- Cont supportive care ?- Repeat PT/OT and SLP evals ?- Begin dispo planning ? ? ?Consuella Lose, MD ?[facility]  ? ?

## 2022-02-18 NOTE — Progress Notes (Signed)
? ?NAME:  Vernon [name], MRN:  [id number], DOB:  1946/10/24, LOS: 8 ?ADMISSION DATE:  02/10/2022, CONSULTATION DATE:  3/18 ?REFERRING MD:  Sarita Haver, CHIEF COMPLAINT:  headache, weakness, nausea  ? ?History of Present Illness:  ?76 y/o male went to [facility] on 3/18 with weakness and nausea, found to have a cerebellar hemorrhage.  Repeat head CT showed expansion. He vomited and developed hypoxemia requiring intubation for airway protection.  He went for an emergent craniotomy on 3/18, hematoma evacuation and drain placement. PCCM consulted for ventilator management.  ? ?Pertinent  Medical History  ?Hypertension ?Anemia ?GERD ?DM2 ?Hiatal hernia ?Atrial fibrillation on eliquis ?Aortic stenosis ?Hypertension ?myelodysplasia ? ?Significant Hospital Events: ?Including procedures, antibiotic start and stop dates in addition to other pertinent events   ?3/18 admission, intubation for airway protection after vomiting, underwent Suboccipital craniectomy and evacuation of cerebellar hemorrhage.  Placement of right parieto-occipital ventriculostomy by Dr. Ellene Route ?3/19 repeat CT head > s/p left suboccipital craniectomy and cerebellar hematoma evacuation with moderate residual volume of posterior fossa blood, improved patency of 4th ventricle, decreased lateral and 3rd ventricle size; EVD in place, otherwise stable CT ?3/20  MRI Brain>>Left suboccipital craniectomy for hematoma evacuation. Roughly 13 mL residual left cerebellar blood tracking across midline to the right. Cerebellar edema with partial effacement of the 4th ventricle. But basilar cistern patency is adequate, with no ventriculomegaly. Sequelae of attempted EVD placement across the splenium of the corpus callosum. Trace intraventricular and subarachnoid hemorrhage. Background mild to moderate nonspecific cerebral white matter signal changes. 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 ?3/21: patient on PSV; thoracentesis for R  pleural effusion; off pressors; sedation switched to cleviprex; hypertonic saline stopped due to hypernatremia; started on unasyn, trach asp sent ?3/22: pulled EVD out; NSG aware and will just continue to monitor; extubated ?3/23: remains on precedex; off levo, transfused 1 unit PRBC, repeat CTH stable ?Interim History / Subjective:  ?Doing very well.  Getting chest PT.  Thinks breathing is improving. ? ?Objective   ?Blood pressure (!) 140/92, pulse 66, temperature 98 ?F (36.7 ?C), resp. rate (!) 21, height '5\' 9"'$  (1.753 m), weight 93.5 kg, SpO2 96 %. ?   ?   ? ?Intake/Output Summary (Last 24 hours) at 02/18/2022 0919 ?Last data filed at 02/18/2022 0700 ?Gross per 24 hour  ?Intake 1983.39 ml  ?Output 1600 ml  ?Net 383.39 ml  ? ? ?Filed Weights  ? 02/16/22 0500 02/17/22 0500 02/18/22 0500  ?Weight: 93.8 kg 93.5 kg 93.5 kg  ? ? ?Examination: ? ?No distress ?[name] in place ?Postop site on subocciput dressed without strikethrough ?Moving all 4 ext, speaking ?Ext warm ? ?No new labs ?CBG okay ? ?Resolved Hospital Problem list   ? ? ?Assessment & Plan:  ?Acute respiratory failure with hypoxemia due inability to protect airway  ?Right sided transudative pleural effusion ?Aspiration pneumonia- s/p 5 days unasyn ?Aspiration precautions ?CPT/IS ?Agree he is stable to leave unit ? ?Spontaneous Cerebellar hematoma, s/p evacuation on 3/18 ?Cerebral edema ?Obstructive hydrocephalus ?Acute encephalopathy due to the above ?F/u NSGY, neuro recs ?Continue to monitor neuro exam ?Secondary stroke prevention per neurology/stroke service ?BP goals and glucose goals per stroke service ?Statin, antiplatelet agent per stroke service ?Imaging per stroke service ?PT/OT/SLP ?Keppra per neuro ? ? ?Moderate aortic valve stenosis, moderate MR/TR ?Hypertension ?Hyperlipidemia ?Chronic atrial fibrillation on eliquis ?Echo 3/19>> EF 55-60%, LV normal function, moderate LV hypertrophy, RV Normal function, Moderately elevated PASP, LA severely dilated,  Small  pericardial effusion , No tamponade, Mild MV regurg, moderate calcification of the aortic valve. Mild aortic valve stenosis. IVC dilated in size RAP about 15 mm HG ?May need watchman procedure as outpatient ?Tele ?Hold eliquis ?Prn labetalol ? ?DM2 with hyperglycemia ?Better controlled, semglee+SSI continue ? ?Myelodysplasia, received 1 U PRBC in OR 3/18 ?Monitor for bleeding ?Transfuse PRBC for Hgb < 7 gm/dL ? ?AKI> new 3/19, oliguric ?Hypernatremic > improving ?Replace electrolytes as needed ?Avoid nephrotoxic agents ?Monitor BMET and UOP ?Replace electrolytes as needed ? ?Constipation ?Continue senna, miralax ?Continue bowel regimen ? ?Stable for transfer to floor, given improvement in resp status, PCCM available PRN ? ?Erskine Emery MD PCCM ? ?

## 2022-02-18 NOTE — Progress Notes (Signed)
Increased confusion to situation and place into the evening; disoriented to place; wife present at bedside; MD will be paged to evaluate.  MD returned page and ordered CT scan follow up. NIH change is orientation; see assessment. ?

## 2022-02-18 NOTE — Progress Notes (Signed)
Received from ICU via bed; patient is alert; oriented to room and unit routine. Bed is low, alarmed and locked. ICU RN stated she notified family of room change and location. ?

## 2022-02-18 NOTE — Progress Notes (Signed)
IP rehab admissions - Patient appears appropriate as candidate for CIR admission.  Order placed for rehab consult per protocol for full evaluation for potential CIR admission.  Call for questions.  [phone] ?

## 2022-02-18 NOTE — Progress Notes (Signed)
Physical Therapy Treatment ?Patient Details ?Name: Vernon [name] ?MRN: [id number] ?DOB: 11-24-1946 ?Today's Date: 02/18/2022 ? ? ?History of Present Illness Pt. is a 76 y.o. male presenting to [facility] on 3/18 with worseing L side coordination along with N/V. CTH demonstrated a 2,2 cm L cerebellar peduncle ICH. He had an epsidoe fo emisis and developed hypoxemia, requiring intubation as well as undergoing an emergent craniotomy on 3/18; hematoma evacuation and drain placement. PMH significant for anemia, arthritis, GERD, DM2, GERD, HTN, afibb on Eliquis. ? ?  ?PT Comments  ? ? Pt tolerates treatment well, much more alert and following commands consistently. Pt is able to progress to standing this session, although LUE remains weak. PT provides further education on L inattention and R gaze deviation, encouraging pt to attempt to look to left side often. Pt will benefit from continued acute PT intervention in an effort to improve mobility quality and restore the patient's ability to ambulate. PT continues to recommend AIR admission at this time, the pt is now much better able to participate.  ?Recommendations for follow up therapy are one component of a multi-disciplinary discharge planning process, led by the attending physician.  Recommendations may be updated based on patient status, additional functional criteria and insurance authorization. ? ?Follow Up Recommendations ? Acute inpatient rehab (3hours/day) ?  ?  ?Assistance Recommended at Discharge Intermittent Supervision/Assistance  ?Patient can return home with the following Two people to help with walking and/or transfers;Two people to help with bathing/dressing/bathroom;Assistance with cooking/housework;Assistance with feeding;Direct supervision/assist for financial management;Direct supervision/assist for medications management;Assist for transportation;Help with stairs or ramp for entrance ?  ?Equipment Recommendations ? Wheelchair (measurements PT);Wheelchair  cushion (measurements PT);Hospital bed;Other (comment)  ?  ?Recommendations for Other Services   ? ? ?  ?Precautions / Restrictions Precautions ?Precautions: Fall ?Precaution Comments: [name] ?Restrictions ?Weight Bearing Restrictions: No  ?  ? ?Mobility ? Bed Mobility ?Overal bed mobility: Needs Assistance ?Bed Mobility: Supine to Sit, Sit to Supine ?  ?  ?Supine to sit: Min assist, HOB elevated ?Sit to supine: Mod assist, HOB elevated ?  ?  ?  ? ?Transfers ?Overall transfer level: Needs assistance ?Equipment used: 1 person hand held assist ?Transfers: Sit to/from Stand ?Sit to Stand: Mod assist ?  ?  ?  ?  ?  ?General transfer comment: pt stands 4 times at edge of bed ?  ? ?Ambulation/Gait ?  ?  ?  ?  ?  ?  ?Pre-gait activities: pt unable to clear either foot, requires PT assist in an effort to slide foot, unable to take steps ?  ? ? ?Stairs ?  ?  ?  ?  ?  ? ? ?Wheelchair Mobility ?  ? ?Modified Rankin (Stroke Patients Only) ?Modified Rankin (Stroke Patients Only) ?Pre-Morbid Rankin Score: No symptoms ?Modified Rankin: Moderately severe disability ? ? ?  ?Balance Overall balance assessment: Needs assistance ?Sitting-balance support: No upper extremity supported, Feet supported ?Sitting balance-Leahy Scale: Fair ?  ?  ?Standing balance support: Bilateral upper extremity supported ?Standing balance-Leahy Scale: Poor ?Standing balance comment: modA ?  ?  ?  ?  ?  ?  ?  ?  ?  ?  ?  ?  ? ?  ?Cognition Arousal/Alertness: Awake/alert ?Behavior During Therapy: Flat affect ?Overall Cognitive Status: Impaired/Different from baseline ?Area of Impairment: Memory, Following commands, Awareness, Problem solving ?  ?  ?  ?  ?  ?  ?  ?  ?  ?  ?Memory: Decreased short-term memory ?Following  Commands: Follows one step commands with increased time ?  ?Awareness: Intellectual ?Problem Solving: Slow processing ?  ?  ?  ? ?  ?Exercises   ? ?  ?General Comments General comments (skin integrity, edema, etc.): pt on 2L [place] upon PT  arrival, PT weans to room air with activity, pt sats in low 90s. PT returns pt to room air prior to departure ?  ?  ? ?Pertinent Vitals/Pain Pain Assessment ?Pain Assessment: No/denies pain ?Pain Score: 0-No pain  ? ? ?Home Living   ?  ?  ?  ?  ?  ?  ?  ?  ?  ?   ?  ?Prior Function    ?  ?  ?   ? ?PT Goals (current goals can now be found in the care plan section) Acute Rehab PT Goals ?Patient Stated Goal: PT goal to improve functional mobility quality in an effort to reduce caregiver burden. Pt unable to state goal ?Progress towards PT goals: Progressing toward goals ? ?  ?Frequency ? ? ? Min 3X/week ? ? ? ?  ?PT Plan Current plan remains appropriate  ? ? ?Co-evaluation   ?  ?  ?  ?  ? ?  ?AM-PAC PT "6 Clicks" Mobility   ?Outcome Measure ? Help needed turning from your back to your side while in a flat bed without using bedrails?: A Little ?Help needed moving from lying on your back to sitting on the side of a flat bed without using bedrails?: A Little ?Help needed moving to and from a bed to a chair (including a wheelchair)?: A Lot ?Help needed standing up from a chair using your arms (e.g., wheelchair or bedside chair)?: A Lot ?Help needed to walk in hospital room?: Total ?Help needed climbing 3-5 steps with a railing? : Total ?6 Click Score: 12 ? ?  ?End of Session Equipment Utilized During Treatment: Oxygen ?Activity Tolerance: Patient tolerated treatment well ?Patient left: in bed;with call bell/phone within reach;with bed alarm set ?Nurse Communication: Mobility status;Need for lift equipment ?PT Visit Diagnosis: Other abnormalities of gait and mobility (R26.89);Muscle weakness (generalized) (M62.81);Other symptoms and signs involving the nervous system (R29.898) ?  ? ? ?Time: 2637-8588 ?PT Time Calculation (min) (ACUTE ONLY): 38 min ? ?Charges:  $Therapeutic Activity: 38-52 mins          ?          ? ?Zenaida Niece, PT, DPT ?Acute Rehabilitation ?Pager: [phone] ?Office [phone] ? ? ? ?Zenaida Niece ?02/18/2022, 2:48 PM ? ?

## 2022-02-18 NOTE — Progress Notes (Signed)
Wife left the hospital this evening; called wife with update that patient will go for repeat CT scan per neurology.  Patient remains alert, vital signs stable and safety maintained. ?

## 2022-02-19 ENCOUNTER — Inpatient Hospital Stay (HOSPITAL_COMMUNITY): Payer: Medicare Other

## 2022-02-19 DIAGNOSIS — E785 Hyperlipidemia, unspecified: Secondary | ICD-10-CM

## 2022-02-19 DIAGNOSIS — G911 Obstructive hydrocephalus: Secondary | ICD-10-CM

## 2022-02-19 DIAGNOSIS — R609 Edema, unspecified: Secondary | ICD-10-CM

## 2022-02-19 DIAGNOSIS — I614 Nontraumatic intracerebral hemorrhage in cerebellum: Secondary | ICD-10-CM | POA: Diagnosis not present

## 2022-02-19 DIAGNOSIS — R1310 Dysphagia, unspecified: Secondary | ICD-10-CM

## 2022-02-19 LAB — BASIC METABOLIC PANEL
Anion gap: 3 — ABNORMAL LOW (ref 5–15)
BUN: 36 mg/dL — ABNORMAL HIGH (ref 8–23)
CO2: 28 mmol/L (ref 22–32)
Calcium: 9.4 mg/dL (ref 8.9–10.3)
Chloride: 116 mmol/L — ABNORMAL HIGH (ref 98–111)
Creatinine, Ser: 1.34 mg/dL — ABNORMAL HIGH (ref 0.61–1.24)
GFR, Estimated: 55 mL/min — ABNORMAL LOW (ref >60)
Glucose, Bld: 136 mg/dL — ABNORMAL HIGH (ref 70–99)
Potassium: 4.4 mmol/L (ref 3.5–5.1)
Sodium: 147 mmol/L — ABNORMAL HIGH (ref 135–145)

## 2022-02-19 LAB — CBC WITH DIFFERENTIAL/PLATELET
Abs Immature Granulocytes: 0.06 10*3/uL (ref 0.00–0.07)
Basophils Absolute: 0 10*3/uL (ref 0.0–0.1)
Basophils Relative: 1 %
Eosinophils Absolute: 0.4 10*3/uL (ref 0.0–0.5)
Eosinophils Relative: 7 %
HCT: 28.2 % — ABNORMAL LOW (ref 39.0–52.0)
Hemoglobin: 9.2 g/dL — ABNORMAL LOW (ref 13.0–17.0)
Immature Granulocytes: 1 %
Lymphocytes Relative: 29 %
Lymphs Abs: 1.5 10*3/uL (ref 0.7–4.0)
MCH: 30.9 pg (ref 26.0–34.0)
MCHC: 32.6 g/dL (ref 30.0–36.0)
MCV: 94.6 fL (ref 80.0–100.0)
Monocytes Absolute: 0.3 10*3/uL (ref 0.1–1.0)
Monocytes Relative: 7 %
Neutro Abs: 2.9 10*3/uL (ref 1.7–7.7)
Neutrophils Relative %: 55 %
Platelets: 156 10*3/uL (ref 150–400)
RBC: 2.98 MIL/uL — ABNORMAL LOW (ref 4.22–5.81)
RDW: 26.7 % — ABNORMAL HIGH (ref 11.5–15.5)
Smear Review: ADEQUATE
WBC: 5.2 10*3/uL (ref 4.0–10.5)
nRBC: 0.4 % — ABNORMAL HIGH (ref 0.0–0.2)

## 2022-02-19 LAB — GLUCOSE, CAPILLARY
Glucose-Capillary: 137 mg/dL — ABNORMAL HIGH (ref 70–99)
Glucose-Capillary: 196 mg/dL — ABNORMAL HIGH (ref 70–99)
Glucose-Capillary: 182 mg/dL — ABNORMAL HIGH (ref 70–99)
Glucose-Capillary: 166 mg/dL — ABNORMAL HIGH (ref 70–99)
Glucose-Capillary: 124 mg/dL — ABNORMAL HIGH (ref 70–99)

## 2022-02-19 MED ORDER — CHLORHEXIDINE GLUCONATE 0.12 % MT SOLN
OROMUCOSAL | Status: AC
  Administered 2022-02-19: 15 mL
  Filled 2022-02-19: qty 15

## 2022-02-19 NOTE — Hospital Course (Addendum)
76 yo with hx atrial fibrillation on eliquis, T2DM, anemia, GERD, aortic stenosis, MDS and multiple other medical problems presented with weakness and nausea, found to have [name] cerebellar hemorrhage.  He required intubation for airway protection and underwent emergent craniotomy on 3/18 with hematoma evacuation and drain placement.   ? ?Significant Events ?3/18 admission, intubation for airway protection after vomiting, underwent Suboccipital craniectomy and evacuation of cerebellar hemorrhage.  Placement of right parieto-occipital ventriculostomy by Dr. Ellene Route ?3/19 repeat CT head > s/p left suboccipital craniectomy and cerebellar hematoma evacuation with moderate residual volume of posterior fossa blood, improved patency of 4th ventricle, decreased lateral and 3rd ventricle size; EVD in place, otherwise stable CT ?3/20  MRI Brain>>Left suboccipital craniectomy for hematoma evacuation. Roughly 13 mL residual left cerebellar blood tracking across midline to the right. Cerebellar edema with partial effacement of the 4th ventricle. But basilar cistern patency is adequate, with no ventriculomegaly. Sequelae of attempted EVD placement across the splenium of the corpus callosum. Trace intraventricular and subarachnoid hemorrhage. Background mild to moderate nonspecific cerebral white matter signal changes. 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 ?3/21: patient on PSV; thoracentesis for R pleural effusion; off pressors; sedation switched to cleviprex; hypertonic saline stopped due to hypernatremia; started on unasyn, trach asp sent ?3/22: pulled EVD out; NSG aware and will just continue to monitor; extubated ?3/23: remains on precedex; off levo, transfused 1 unit PRBC, repeat CTH stable ?3/27 TRH now primary ?Worsening mental status and weakness today, head CT pending ?

## 2022-02-19 NOTE — Assessment & Plan Note (Addendum)
3/19 suboccipital craniectomy and evacuation of cerebellar hemorrhage, placement of R parietooccipital ventriculostomy ?EVD pulled out 3/22 ?keppra post op x14 days (ends 02/24/22) ?NSGY recommending continued supportive care, PT/OT/SLP,  ?-Patient to be discharged to inpatient rehab. ?-Will need outpatient follow-up with neurosurgery. ? ?

## 2022-02-19 NOTE — Assessment & Plan Note (Addendum)
Now extubated on RA, follow CXR 3/28 - no acute cardiopulm abnormality ?S/p unasyn for concern for aspiration pneumonia. ?

## 2022-02-19 NOTE — Progress Notes (Signed)
Modified Barium Swallow Progress Note ? ?Patient Details  ?Name: Vernon [name] ?MRN: [id number] ?Date of Birth: January 05, 1946 ? ?Today's Date: 02/19/2022 ? ?Modified Barium Swallow completed.  Full report located under Chart Review in the Imaging Section. ? ?Brief recommendations include the following: ? ?Clinical Impression ?  The view of the pt's larynx was intermittently partially obscured by his shoulder. Pt presents with pharyngeal dysphagia characterized by reduced tongue base retraction, reduced anterior laryngeal movement, and a pharyngeal delay, He demonstrated vallecular residue, pyriform sinus residue, inocmplete epiglottic inversion, and posterior pharyngeal wall residue. Penetration (PAS 3, 5) during and after deglutition, and aspiration (PAS 7, 8) after the swallow were noted with thin liquids via cup and straw. Aspiration resulted in a cough once, but this was ineffective in expelling the aspirate and all other instances of aspiration were silent. A chin tuck posture was effective in eliminating penetration and aspiration with individual boluses of thin liquids, but pt exhibited difficulty maintaining this posture. A dysphagia 2 diet with nectar thick liquids is recommended at this time. Results and recommendations were discussed with pt's wife via phone and she indicated that she will bring the pt's dentures in tomorrow. SLP will follow pt for treatment. ?  ?Swallow Evaluation Recommendations ? ?   ? ? SLP Diet Recommendations: Dysphagia 2 (Fine chop) solids;Nectar thick liquid ? ? Liquid Administration via: Cup;Straw ? ? Medication Administration: Whole meds with liquid ? ? Supervision: Staff to assist with self feeding;Full assist for feeding ? ? Compensations: Slow rate;Small sips/bites;Follow solids with liquid ? ? Postural Changes: Seated upright at 90 degrees ? ? Oral Care Recommendations: Oral care BID;Staff/trained caregiver to provide oral care ? ? Other Recommendations: Order thickener from  pharmacy ? ? ?Ocean Schildt I. Hardin Negus, [place], CCC-SLP ?Acute Rehabilitation Services ?Office number [phone] ?Pager [phone] ? ?[name] ?02/19/2022,2:22 PM ? ?

## 2022-02-19 NOTE — Assessment & Plan Note (Signed)
Follows with Dr. Rogue Bussing, follow outpatient ?

## 2022-02-19 NOTE — Assessment & Plan Note (Addendum)
Improved, with gentle hydration. ?-Patient to be discharged to inpatient rehab. ?

## 2022-02-19 NOTE — Evaluation (Signed)
Speech Language Pathology Evaluation ?Patient Details ?Name: Vernon [name] ?MRN: [id number] ?DOB: 1946/05/21 ?Today's Date: 02/19/2022 ?Time: 7322-0254 ?SLP Time Calculation (min) (ACUTE ONLY): 26 min ? ?Problem List:  ?Patient Active Problem List  ? Diagnosis Date Noted  ? AKI (acute kidney injury) ([place])   ? Hyperkalemia   ? Anemia   ? Pleural effusion on right   ? Acute respiratory failure with hypoxia ([place])   ? Cerebral edema (HCC)   ? Chronic atrial fibrillation (HCC)   ? Type 2 diabetes mellitus with hyperglycemia (HCC)   ? ICH (intracerebral hemorrhage) ([place]) 02/10/2022  ? Intracranial hemorrhage ([place])   ? Anticoagulated   ? Moderate aortic valve stenosis 03/08/2020  ? MDS (myelodysplastic syndrome), low grade ([place]) 02/18/2020  ? Macrocytic anemia 01/13/2020  ? Spleen enlargement 01/13/2020  ? Bilateral carotid artery stenosis 07/14/2018  ? Atrial fibrillation, chronic ([place]) 07/14/2018  ? 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 Benign essential hypertension 08/16/2014  ? Hyperlipemia, mixed 08/16/2014  ? Renal insufficiency 08/16/2014  ? ?Past Medical History:  ?Past Medical History:  ?Diagnosis Date  ? Anemia   ? Aortic stenosis   ? Arthritis   ? Complication of anesthesia   ? hard time getting bp up after knee replacement  ? Diabetes mellitus without complication ([place])   ? GERD (gastroesophageal reflux disease)   ? occ tums prn  ? History of hiatal hernia   ? Hypertension   ? 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[place])   ? ?Past Surgical History:  ?Past Surgical History:  ?Procedure Laterality Date  ? CARPAL TUNNEL RELEASE  2012  ? CRANIOTOMY N/A 02/10/2022  ? Procedure: SUBOCCIPITAL CRANIECTOMY FOR EVACUATION OF CEREBELLAR HEMATOMA;  Surgeon: Kristeen Miss, MD;  Location: [place];  Service: Neurosurgery;  Laterality: N/A;  ? JOINT REPLACEMENT Right 2010  ? SHOULDER ARTHROSCOPY WITH ROTATOR CUFF REPAIR AND OPEN BICEPS TENODESIS Right 11/09/2019  ? Procedure: RIGHT SHOULDER ARTHROSCOPY WITH SUBSCAPULARIS  REPAIR, SUBACROMIAL DECOMPRESSION,MINI OPEN ROTATOR CUFF REPAIR;  Surgeon: Leim Fabry, MD;  Location: [facility];  Service: Orthopedics;  Laterality: Right;  ? ?HPI:  ?Pt is a 76 y.o. male who presented to the ED on 3/18 with worseing L side coordination and N/V. 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 Surrounding edema with mild effacement of the fourth ventricle. Pt had an epsidoe fo emesis and developed hypoxemia, requiring intubation for airway protection. ETT 3/18-3/22. Pt s/p emergent craniotomy on 3/18, hematoma evacuation and drain placement. [name] placed 3/20. PMH: anemia, arthritis, GERD, DM2, GERD, HTN, afibb on Eliquis.  ? ?Assessment / Plan / Recommendation ?Clinical Impression ? Pt participated in speech-language-cognition evaluation. Pt denied any baseline deficits in speech, language, or cognition. He initially denied any acute changes, but stated at the end of the evaluation that he was "a little slow" compared to baseline. Motor speech and language skills were [facility]. [facility] Mental Status Examination was completed to evaluate the pt's cognitive-linguistic skills. His score was adjusted to exclude portions of the evaluation which required writing. He received an adjusted score of 8/24 which is below the normal limits. He exhibited difficulty in the areas of awareness, attention, memory, problem solving, and executive function. Pt's level of alertness waned throughout the evaluation with more frequent need for tactile stimulation to maintain alertness; the impact of this on his performance is considered. Skilled SLP services are clinically indicated at this time to improve  cognitive-linguistic function. ?   ?SLP Assessment ? SLP Recommendation/Assessment: Patient needs continued [place] [facility] ?SLP Visit Diagnosis: Cognitive communication deficit  (R41.841)  ?  ?Recommendations for follow up therapy are one component of a multi-disciplinary discharge planning process, led by the attending physician.  Recommendations may be updated based on patient status, additional functional criteria and insurance authorization. ?   ?Follow Up Recommendations ? Acute inpatient rehab (3hours/day)  ?  ?Assistance Recommended at Discharge ? Frequent or constant Supervision/Assistance  ?Functional Status Assessment Patient has had a recent decline in their functional status and demonstrates the ability to make significant improvements in function in a reasonable and predictable amount of time.  ?Frequency and Duration min 2x/week  ?2 weeks ?  ?   ?SLP Evaluation ?Cognition ? Overall Cognitive Status: Impaired/Different from baseline ?Arousal/Alertness: Awake/alert ?Orientation Level: Oriented to person;Oriented to place;Disoriented to time ?Year: 2023 ?Month: April ?Day of Week: Incorrect ?Attention: Focused;Sustained ?Focused Attention: Impaired ?Focused Attention Impairment: Verbal complex ?Memory: Impaired ?Memory Impairment: Retrieval deficit;Decreased recall of new information (Immediate: 5/5 with repetion x3; delayed: 2/5; with cues: 2/3) ?Awareness: Impaired ?Awareness Impairment: Emergent impairment ?Problem Solving: Impaired ?Problem Solving Impairment: Verbal complex (Money: 1/3; time: 0/1) ?Executive Function: Sequencing;Organizing ?Organizing: Impaired ?Organizing Impairment: Verbal complex (Backward digit span: 0/2)  ?  ?   ?Comprehension ? Auditory Comprehension ?Overall Auditory Comprehension: Appears within functional limits for tasks assessed ?Yes/No Questions: Within Functional Limits ?Commands: Within Functional Limits  ?  ?Expression Expression ?Primary Mode of Expression: Verbal ?Verbal Expression ?Overall Verbal Expression: Appears within functional limits for tasks assessed ?Initiation: No impairment ?Level of Generative/Spontaneous Verbalization:  Conversation ?Repetition: No impairment ?Naming: No impairment   ?Oral / Motor ? Motor Speech ?Overall Motor Speech: Appears within functional limits for tasks assessed ?Respiration: Within functional limits ?Phonation: Normal ?Resonance: Within functional limits ?Articulation: Within functional limitis ?Intelligibility: Intelligible ?Motor Planning: Witnin functional limits   ?        ?Louretta Tantillo I. Hardin Negus, [place], CCC-SLP ?Acute Rehabilitation Services ?Office number [phone] ?Pager [phone] ? ?[name] ?02/19/2022, 10:11 AM ? ? ? ? ? ?

## 2022-02-19 NOTE — Assessment & Plan Note (Addendum)
-  Blood pressure soft on 02/25/2022 and as such patient's Coreg was held. ?-Coreg resumed 02/26/2022. ?

## 2022-02-19 NOTE — Assessment & Plan Note (Addendum)
S/p hypertonic saline ?Now off, Na trending down and stabilized at 134 by day of discharge.   ?

## 2022-02-19 NOTE — Assessment & Plan Note (Addendum)
Presented to [facility] 3/18 with weakness and nausea, found to have cerebellar hemorrhage. ?Patient seen by neurology, L cerebellar ICH 2/2 eliquis use and HTN ?Head CT 3/18 with acute 2.2 cm intraparencymal hemorrhage in the L middle cerebellar peduncle with probable small volume adjacent extra axial extension, surrounding edema with mild effacement of 4th ventricle, no hydrocephalus ?CTA head/neck 3/18 without LVO, no evidence of vascular malformation or aneurysm in region of acute hemorrhage -> consider f/u after resolution of hemorrhage -- nondominant right verterbral artery origin stenosis, mild atherosclerosis ?CT 3/18 with increased size of intraparenchymal hematoma, increased subdural blood ?3/19 head CT s/p L suboccipital craniectomy and cerebellar hematoma evaculation with a moderate residual volume of posterior fossa blood, but improved patency of the 4th ventricle and decreased lateral and 3rd bentricle size, sequelae of temporary R parieto occipital EVD placement ?3/20 MRI brain L suboccipital craniectomy for hematoma evacuation, 13 ml residual L cerebellar blood tracking across midline to R, cerebellar edema with partial effacement of the 4th ventricle - sequelae of attempted EVD placement across splenium of corpus collosum ?3/20 head CT with no unexpected or new unfavorable finding ?3/23 head CT with no interval progression of cerebellar edema and hemorrhage ?3/26 head CT with decreased size and conspicuity of L superior cerebellar hemorrhage with associated edema, with slightly decreased mass effect on the 4th ventricle, redemonstrated hemorrahge in the R occipital ventriculostomy tract, slightly decreased density of hemorrhage in parietal ventriculostomy tract  ?Repeat head CT 3/28 with worsening mental status, worsening weakness (LUE flaccid) ?Discussed with neuro who evaluated the patient, and is noted noted unfortunately patient with neurological worsening however no reversible etiology noted on  work-up as infectious work-up done negative. ?It is noted by neurology that patient had recurrent risk for embolic strokes from A-fib as he is off anticoagulation however unsafe to start anticoagulation at this time and may need to make a decision about resuming anticoagulation alternative treatment like Watchman device in about a month from now when patient has recovered from his acute stroke. ?-Patient followed by PT/OT/SLP. ?-Being assessed for inpatient rehab admission. ?-Per RN patient seen by inpatient rehab MD and patient noted to be agitated on 02/24/2022 and as such admission to inpatient rehab was held on 02/24/2022. ?No antithrombotic 2/2 ICH ?LDL 25, no statin, A1c 6.2 ?Echo EF 55-60%, no RWMA ?-Zyprexa 2.5 mg twice daily for agitation started 02/24/2022 with improvement with agitation and mentation.  ?-Patient will be discharged to inpatient rehab.  ?Will need outpatient follow-up with neurology and neurosurgery postdischarge from inpatient rehab. ? ?

## 2022-02-19 NOTE — Assessment & Plan Note (Addendum)
-   Statin.  

## 2022-02-19 NOTE — Care Management Important Message (Signed)
Important Message ? ?Patient Details  ?Name: Vernon [name] ?MRN: [id number] ?Date of Birth: 01-Jun-1946 ? ? ?Medicare Important Message Given:  Yes ? ? ? ? ?Almando Brawley ?02/19/2022, 2:12 PM ?

## 2022-02-19 NOTE — Progress Notes (Addendum)
Inpatient Rehab Admissions Coordinator:  ? ?Met with patient at bedside to discuss CIR recommendations and goals/expectations of potential CIR stay.  Pt reports he lives with wife, who works during the day, but that she should be able to stay with him if needed.  He is agreeable to rehab and that I can call spouse to confirm dispo and answer questions.  I will do that this afternoon and start insurance auth process.  ? ?Addendum 1318: Spoke to wife and answered her questions.  Let her know that average length of stay is about 2 weeks, and typically we recommend 24/7 supervision at discharge either from family/friends or hired caregivers.  I let her know that insurance did not typically approve SNF following CIR admit.  She is in agreement and I will start auth process.  ? ?Shann Medal, PT, DPT ?Admissions Coordinator ?[phone] ?02/19/22  ?12:28 PM ? ?

## 2022-02-19 NOTE — Progress Notes (Signed)
Physical Therapy Treatment ?Patient Details ?Name: Vernon [name] ?MRN: [id number] ?DOB: [birth year] ?Today's Date: 02/19/2022 ? ? ?History of Present Illness Pt. is a 76 y.o. male presenting to [facility] on 3/18 with worseing L side coordination along with N/V. CTH demonstrated a 2,2 cm L cerebellar peduncle ICH. He had an epsidoe fo emisis and developed hypoxemia, requiring intubation as well as undergoing an emergent craniotomy on 3/18; hematoma evacuation and drain placement. PMH significant for anemia, arthritis, GERD, DM2, GERD, HTN, afibb on Eliquis. ? ?  ?PT Comments  ? ? Pt more alert and interactive today despite confusion. Began gait progression however remains to demo severe L sided neglect, impaired co-ordination, and decreased strength t/o L UE and LE. Pt to strongly benefit from AIR upon d/c for maximal functional recovery. Acute PT to con't to follow. ?   ?Recommendations for follow up therapy are one component of a multi-disciplinary discharge planning process, led by the attending physician.  Recommendations may be updated based on patient status, additional functional criteria and insurance authorization. ? ?Follow Up Recommendations ? Acute inpatient rehab (3hours/day) ?  ?  ?Assistance Recommended at Discharge Intermittent Supervision/Assistance  ?Patient can return home with the following Two people to help with walking and/or transfers;Two people to help with bathing/dressing/bathroom;Assistance with cooking/housework;Assistance with feeding;Direct supervision/assist for financial management;Direct supervision/assist for medications management;Assist for transportation;Help with stairs or ramp for entrance ?  ?Equipment Recommendations ? Wheelchair (measurements PT);Wheelchair cushion (measurements PT);Hospital bed;Other (comment)  ?  ?Recommendations for Other Services Rehab consult ? ? ?  ?Precautions / Restrictions Precautions ?Precautions: Fall ?Precaution Comments: [name] ?Restrictions ?Weight  Bearing Restrictions: No  ?  ? ?Mobility ? Bed Mobility ?Overal bed mobility: Needs Assistance ?Bed Mobility: Supine to Sit ?  ?  ?Supine to sit: Mod assist, +2 for physical assistance, HOB elevated ?  ?  ?General bed mobility comments: bed pads used to assist wtih scooting hips to EOB, pt began bringing LEs off EOB with verbal cues, modAx2 for trunk elevation, difficulty using L UE functionally ?  ? ?Transfers ?Overall transfer level: Needs assistance ?Equipment used: 2 person hand held assist, Rolling walker (2 wheels) ?Transfers: Bed to chair/wheelchair/BSC, Sit to/from Stand ?Sit to Stand: Mod assist, +2 physical assistance ?  ?Step pivot transfers: Mod assist, Max assist, +2 physical assistance (a third with chair) ?  ?  ?  ?General transfer comment: stood from EOB with hand held assist +2 ambulated away from bed with RW and assistance with LUE to grip walker, third person for chair follow ?  ? ?Ambulation/Gait ?  ?  ?  ?  ?  ?  ?Pre-gait activities: began marching in place with maxA for weight shifting L/R and max directional verbal cues via bilat HHA. Pt then given RW to progress gait however pt with significant leaning and unable to pick up feet to ambulate, RN brough chair up behind patient ?  ? ? ?Stairs ?  ?  ?  ?  ?  ? ? ?Wheelchair Mobility ?  ? ?Modified Rankin (Stroke Patients Only) ?Modified Rankin (Stroke Patients Only) ?Pre-Morbid Rankin Score: No symptoms ?Modified Rankin: Severe disability ? ? ?  ?Balance Overall balance assessment: Needs assistance ?Sitting-balance support: Single extremity supported, Feet supported ?Sitting balance-Leahy Scale: Fair ?Sitting balance - Comments: close min guard/minA ?Postural control: Posterior lean, Left lateral lean ?Standing balance support: Single extremity supported, During functional activity ?Standing balance-Leahy Scale: Poor ?Standing balance comment: stood at sink with OT for 2 min, PT supported L  knee and hand on counter, promoted L sided weight  bearing while OT worked with R UE to complete ADLs, pt with progressive L Lateral lean/side bending with onset of fatigue ?  ?  ?  ?  ?  ?  ?  ?  ?  ?  ?  ?  ? ?  ?Cognition Arousal/Alertness: Awake/alert ?Behavior During Therapy: WFL for tasks assessed/performed (more interactive today with engaging conversation) ?Overall Cognitive Status: Impaired/Different from baseline ?Area of Impairment: Memory, Awareness, Problem solving, Following commands, Safety/judgement ?  ?  ?  ?  ?  ?  ?  ?  ?Orientation Level: Disoriented to, Time ?  ?Memory: Decreased short-term memory ?Following Commands: Follows one step commands with increased time, Follows one step commands inconsistently ?Safety/Judgement: Decreased awareness of safety, Decreased awareness of deficits (L sided inattention) ?Awareness: Intellectual ?Problem Solving: Slow processing, Difficulty sequencing, Requires verbal cues, Requires tactile cues ?General Comments: pt confused despite know he's in the hospital. pt stating he was up walking around prior to PT/OT coming and also commenting about how he wanted to go up to [place] to bring back [employer] things. ?  ?  ? ?  ?Exercises General Exercises - Lower Extremity ?Long Arc Quad: AROM, Both, 10 reps, Seated ?Hip Flexion/Marching: AROM, Both, 10 reps, Seated ? ?  ?General Comments General comments (skin integrity, edema, etc.): Pt SPO2 >92% on RA ?  ?  ? ?Pertinent Vitals/Pain Pain Assessment ?Pain Assessment: No/denies pain  ? ? ?Home Living   ?  ?Available Help at Discharge: Family;Available 24 hours/day ?Type of Home: House ?  ?  ?  ?  ?  ?  ?   ?  ?Prior Function    ?  ?  ?   ? ?PT Goals (current goals can now be found in the care plan section) Acute Rehab PT Goals ?PT Goal Formulation: Patient unable to participate in goal setting ?Time For Goal Achievement: 02/28/22 ?Potential to Achieve Goals: Fair ?Progress towards PT goals: Progressing toward goals ? ?  ?Frequency ? ? ? Min 4X/week ? ? ? ?   ?PT Plan Frequency needs to be updated  ? ? ?Co-evaluation PT/OT/SLP Co-Evaluation/Treatment: Yes ?Reason for Co-Treatment: Complexity of the patient's impairments (multi-system involvement) ?PT goals addressed during session: Mobility/safety with mobility ?OT goals addressed during session: ADL's and self-care ?  ? ?  ?AM-PAC PT "6 Clicks" Mobility   ?Outcome Measure ? Help needed turning from your back to your side while in a flat bed without using bedrails?: A Little ?Help needed moving from lying on your back to sitting on the side of a flat bed without using bedrails?: A Lot ?Help needed moving to and from a bed to a chair (including a wheelchair)?: A Lot ?Help needed standing up from a chair using your arms (e.g., wheelchair or bedside chair)?: A Lot ?Help needed to walk in hospital room?: Total ?Help needed climbing 3-5 steps with a railing? : Total ?6 Click Score: 11 ? ?  ?End of Session Equipment Utilized During Treatment: Oxygen ?Activity Tolerance: Patient tolerated treatment well ?Patient left: in bed;with call bell/phone within reach;with bed alarm set ?Nurse Communication: Mobility status;Need for lift equipment ?PT Visit Diagnosis: Other abnormalities of gait and mobility (R26.89);Muscle weakness (generalized) (M62.81);Other symptoms and signs involving the nervous system (R29.898) ?  ? ? ?Time: 1017-5102 ?PT Time Calculation (min) (ACUTE ONLY): 33 min ? ?Charges:  $Gait Training: 8-22 mins          ?          ? ?  Kittie Plater, PT, DPT ?Acute Rehabilitation Services ?Pager #: [phone] ?Office #: [phone] ? ? ? ?Vernon [name] ?02/19/2022, 12:10 PM ? ?

## 2022-02-19 NOTE — Progress Notes (Signed)
?PROGRESS NOTE ? ? ? ?Vernon [name]  URK:[id number] DOB: [birth year] DOA: 02/10/2022 ?PCP: Baxter Hire, MD  ?No chief complaint on file. ? ? ?Brief Narrative:  ?76 yo with hx atrial fibrillation on eliquis, T2DM, anemia, GERD, aortic stenosis, MDS and multiple other medical problems presented with weakness and nausea, found to have Vernon [name] cerebellar hemorrhage.  He required intubation for airway protection and underwent emergent craniotomy on 3/18 with hematoma evacuation and drain placement.   ? ?Significant Events ?3/18 admission, intubation for airway protection after vomiting, underwent Suboccipital craniectomy and evacuation of cerebellar hemorrhage.  Placement of right parieto-occipital ventriculostomy by Dr. Ellene Route ?3/19 repeat CT head > s/p left suboccipital craniectomy and cerebellar hematoma evacuation with moderate residual volume of posterior fossa blood, improved patency of 4th ventricle, decreased lateral and 3rd ventricle size; EVD in place, otherwise stable CT ?3/20  MRI Brain>>Left suboccipital craniectomy for hematoma evacuation. Roughly 13 mL residual left cerebellar blood tracking across midline to the right. Cerebellar edema with partial effacement of the 4th ventricle. But basilar cistern patency is adequate, with no ventriculomegaly. Sequelae of attempted EVD placement across the splenium of the corpus callosum. Trace intraventricular and subarachnoid hemorrhage. Background mild to moderate nonspecific cerebral white matter signal changes. 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 ?3/21: patient on PSV; thoracentesis for R pleural effusion; off pressors; sedation switched to cleviprex; hypertonic saline stopped due to hypernatremia; started on unasyn, trach asp sent ?3/22: pulled EVD out; NSG aware and will just continue to monitor; extubated ?3/23: remains on precedex; off levo, transfused 1 unit PRBC, repeat CTH stable ?3/27 TRH now primary ?   ? ? ?Assessment & Plan: ?  ?Principal Problem: ?  ICH (intracerebral hemorrhage) ([place]) ?Active Problems: ?  Obstructive hydrocephalus ([place]) ?  Cerebral edema (HCC) ?  Dysphagia ?  Pleural effusion on right ?  Respiratory failure requiring intubation ([place]) ?  Chronic atrial fibrillation (HCC) ?  AKI (acute kidney injury) ([place]) ?  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[place]) ?  Essential hypertension ?  Type 2 diabetes mellitus with hyperglycemia (HCC) ?  Dyslipidemia ?  Hyperkalemia ?  Anemia ? ? ?Assessment and Plan: ?* ICH (intracerebral hemorrhage) ([place]) ?Presented to [facility] 3/18 with weakness and nausea, found to have cerebellar hemorrhage 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 and HTN ?Head CT 3/18 with acute 2.2 cm intraparencymal hemorrhage in the L middle cerebellar peduncle with probable small volume adjacent extra axial extension, surrounding edema with mild effacement of 4th ventricle, no hydrocephalus ?CTA head/neck 3/18 without LVO, no evidence of vascular malformation or aneurysm in region of acute hemorrhage -> consider f/u after resolution of hemorrhage -- nondominant right verterbral artery origin stenosis, mild atherosclerosis ?CT 3/18 with increased size of intraparenchymal hematoma, increased subdural blood ?3/19 head CT s/p L suboccipital craniectomy and cerebellar hematoma evaculation with Vernon [name] moderate residual volume of posterior fossa blood, but improved patency of the 4th ventricle and decreased lateral and 3rd bentricle size, sequelae of temporary R parieto occipital EVD placement ?3/20 MRI brain L suboccipital craniectomy for hematoma evacuation, 13 ml residual L cerebellar blood tracking across midline to R, cerebellar edema with partial effacement of the 4th ventricle - sequelae of attempted EVD placement across splenium of corpus collosum ?3/20 head CT with no unexpected or new unfavorable finding ?3/23 head CT with no interval progression of cerebellar edema and  hemorrhage ?3/26 head CT with decreased size and conspicuity of L superior cerebellar hemorrhage  with associated edema, with slightly decreased mass effect on the 4th ventricle, redemonstrated hemorrahge in the R occipital ventriculostomy tract, slightly decreased density of hemorrhage in parietal ventriculostomy tract ?No antithrombotic 2/2 ICH ?LDL 25, no statin, A1c 6.2 ?Echo EF 55-60%, no RWMA ? ? ?Obstructive hydrocephalus ([place]) ?3/19 suboccipital craniectomy and evacuation of cerebellar hemorrhage, placement of R parietooccipital ventriculostomy ?EVD pulled out 3/22 ?keppra post op x14 days (ends 02/24/22) ?NSGY recommending continued supportive care, PT/OT/SLP, dispo planning ? ? ?Cerebral edema (HCC) ?S/p hypertonic saline ?Now off, Na trending down ?Na 147 today ? ?Dysphagia ?cortrak in place, SLP recs appreciated ? ?AKI (acute kidney injury) ([place]) ?Improved, follow ? ?Chronic atrial fibrillation (HCC) ?Not on any antithrombotics at this time given ICH ?Will need to discuss going forward, per neuro, may need to make decision regarding resuming anticoagulation or alternative treatment, watchman (after he's recovered in abt [name] month)? ? ?Respiratory failure requiring intubation ([place]) ?Now extubated on RA, follow CXR 3/28 ?S/p unasyn for concern for aspiration pneumonia ? ?Pleural effusion on right ?CXR 2/24 with small symmetric effusions ?Will follow CXR 2/28 ?S/p thoracentesis, consistent with transudate ?Cytology with no malignant cells ? ?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[place]) ?Follows with Dr. Rogue Bussing, follow outpatient ? ?Dyslipidemia ?simvastatin ? ?Type 2 diabetes mellitus with hyperglycemia (HCC) ?Basal, bolus ?SSI ? ?Essential hypertension ?Coreg ?Prn labetalol ? ? ?DVT prophylaxis: heparin ?Code Status: full ?Family Communication: none ?Disposition:  ? ?Status is: Inpatient ?Remains inpatient appropriate because: awaiting further improvement ?  ?Consultants:  ?Neuro ?neurosurgery ? ?Procedures:   ?3/19 ? Suboccipital craniectomy and evacuation of cerebellar hemorrhage.  Placement of right parieto-occipital ventriculostomy ? ?Echo ?IMPRESSIONS  ? ? ? 1. Left ventricular ejection fraction, by estimation, is 55 to 60%. The  ?left ventricle has normal function. The left ventricle has no regional  ?wall motion abnormalities. There is moderate left ventricular hypertrophy.  ?Left ventricular diastolic  ?parameters are indeterminate.  ? 2. Right ventricular systolic function is normal. The right ventricular  ?size is normal. There is moderately elevated pulmonary artery systolic  ?pressure.  ? 3. Left atrial size was severely dilated.  ? 4. Right atrial size was mild to moderately dilated.  ? 5. Guila Owensby small pericardial effusion is present. There is no evidence of  ?cardiac tamponade.  ? 6. Mild mitral valve regurgitation.  ? 7. There is moderate calcification of the aortic valve. Aortic valve  ?regurgitation is not visualized. Mild aortic valve stenosis. Aortic valve  ?area, by VTI measures 1.03 cm?Marland Kitchen Aortic valve mean gradient measures 19.0  ?mmHg.  ? 8. The inferior vena cava is dilated in size with <50% respiratory  ?variability, suggesting right atrial pressure of 15 mmHg. ? ? ?Antimicrobials:  ?Anti-infectives (From admission, onward)  ? ? Start     Dose/Rate Route Frequency Ordered Stop  ? 02/13/22 1130  Ampicillin-Sulbactam (UNASYN) 3 g in sodium chloride 0.9 % 100 mL IVPB       ? 3 g ?200 mL/hr over 30 Minutes Intravenous Every 8 hours 02/13/22 1032 02/18/22 0459  ? ?  ? ? ?Subjective: ?No new complaints ? ?Objective: ?Vitals:  ? 02/19/22 0352 02/19/22 0401 02/19/22 0757 02/19/22 1220  ?BP: 140/64  133/75 (!) 132/96  ?Pulse: 60  61 (!) 50  ?Resp: 20  18   ?Temp: 98.6 ?F (37 ?C)  98.2 ?F (36.8 ?C) 98.7 ?F (37.1 ?C)  ?TempSrc: Oral   Oral  ?SpO2: 95%  97% 96%  ?Weight:  98.7 kg    ?Height:      ? ? ?  Intake/Output Summary (Last 24 hours) at 02/19/2022 1615 ?Last data filed at 02/19/2022 0100 ?Gross per 24 hour   ?Intake 120 ml  ?Output 1625 ml  ?Net -1505 ml  ? ?Filed Weights  ? 02/17/22 0500 02/18/22 0500 02/19/22 0401  ?Weight: 93.5 kg 93.5 kg 98.7 kg  ? ? ?Examination: ? ?General exam: Appears calm and comfortable  ?Respiratory syste

## 2022-02-19 NOTE — Assessment & Plan Note (Addendum)
CXR 2/24 with small symmetric effusions. ?S/p thoracentesis, consistent with transudate ?Cytology with no malignant cells ?

## 2022-02-19 NOTE — Assessment & Plan Note (Addendum)
Hemoglobin A1c 6.2 (02/11/2022 ). ?Patient maintained on Semglee as well as NovoLog during the hospitalization.   ?-Patient to be discharged to inpatient rehab.   ?-We will need outpatient follow-up with PCP post inpatient rehab. ?

## 2022-02-19 NOTE — Progress Notes (Signed)
Speech Language Pathology Treatment: Dysphagia  ?Patient Details ?Name: Vernon [name] ?MRN: [id number] ?DOB: February 25, 1946 ?Today's Date: 02/19/2022 ?Time: 7341-9379 ?SLP Time Calculation (min) (ACUTE ONLY): 9 min ? ?Assessment / Plan / Recommendation ?Clinical Impression ? Vernon [name] was seen for dysphagia treatment. He was notably more alert than when he was last seen. He demonstrated coughing with the initial sip of thin liquids via straw, but tolerated the rest of liquids without immediate overt s/sx of aspiration. However, delayed throat clearing was demonstrated at the end of trials. Mastication of dysphagia 2 solids and thin liquids was [facility]. Oral clearance was adequate. A modified barium swallow study is recommended to further assess swallow function. Vernon [name]'s current diet will be continued until the study is completed, but prognosis for advancement is judged to be good.  ?  ?HPI HPI: Vernon [name] is a 76 y.o. male who presented to the ED on 3/18 with worseing L side coordination and N/V. 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 Surrounding edema with mild effacement of the fourth ventricle. Vernon [name] had an epsidoe fo emesis and developed hypoxemia, requiring intubation for airway protection. ETT 3/18-3/22. Vernon [name] s/p emergent craniotomy on 3/18, hematoma evacuation and drain placement. [name] placed 3/20. PMH: anemia, arthritis, GERD, DM2, GERD, HTN, afibb on Eliquis. ?  ?   ?SLP Plan ? MBS ? ?  ?  ?Recommendations for follow up therapy are one component of a multi-disciplinary discharge planning process, led by the attending physician.  Recommendations may be updated based on patient status, additional functional criteria and insurance authorization. ?  ? ?Recommendations  ?Diet recommendations: Nectar-thick liquid (Continue full nectar thick diet.) ?Medication Administration: Crushed with puree ?Supervision: Full supervision/cueing  for compensatory strategies;Trained caregiver to feed patient ?Compensations: Slow rate;Small sips/bites ?Postural Changes and/or Swallow Maneuvers: Seated upright 90 degrees  ?   ?    ?   ? ? ? ? Oral Care Recommendations: Oral care QID ?Assistance recommended at discharge: Frequent or constant Supervision/Assistance ?SLP Visit Diagnosis: Dysphagia, unspecified (R13.10) ?Plan: MBS ? ? ? ? ?  ?  ?Philippe Gang I. Hardin Negus, [place], CCC-SLP ?Acute Rehabilitation Services ?Office number [phone] ?Pager [phone] ? ? ?[name] ? ?02/19/2022, 9:47 AM ? ? ? ?

## 2022-02-19 NOTE — Assessment & Plan Note (Addendum)
cortrak in placed. ?-Patient seen by SLP and started on dysphagia 2 diet with nectar thick liquids and to hold trays if patient remains lethargic early on. ?-Patient made n.p.o. for per speech therapy in part due to mental status. ?-Patient maintained on tube feeds. ?-Patient will be discharged to inpatient rehab and will be followed by speech therapy there. ?

## 2022-02-19 NOTE — Progress Notes (Addendum)
STROKE TEAM PROGRESS NOTE  ? ?INTERVAL HISTORY ?Patient is seen in his room with no family at the bedside.  He has been hemodynamically stable overnight and his neurological exam is stable.  He has had no acute events overnight.  Patient is restless and is sitting in the chair trying to get out of.  Vital signs are stable.  Blood pressure adequately controlled.  Follow-up CT scan from this morning shows decrease size and conspicuity of left superior cerebellar hemorrhage with associated edema with decreased mass effect on the fourth ventricle.  Stable appearance of small right occipital ventriculostomy track hemorrhage. ? ? ?Vitals:  ? 02/19/22 0352 02/19/22 0401 02/19/22 0757 02/19/22 1220  ?BP: 140/64  133/75 (!) 132/96  ?Pulse: 60  61 (!) 50  ?Resp: 20  18   ?Temp: 98.6 ?F (37 ?C)  98.2 ?F (36.8 ?C) 98.7 ?F (37.1 ?C)  ?TempSrc: Oral   Oral  ?SpO2: 95%  97% 96%  ?Weight:  98.7 kg    ?Height:      ? ?CBC:  ?Recent Labs  ?Lab 02/18/22 ?1143 02/19/22 ?0341  ?WBC 4.9 5.2  ?NEUTROABS 3.1 2.9  ?HGB 9.1* 9.2*  ?HCT 28.8* 28.2*  ?MCV 96.3 94.6  ?PLT 149* 156  ? ? ?Basic Metabolic Panel:  ?Recent Labs  ?Lab 02/12/22 ?1601 02/12/22 ?2200 02/13/22 ?1012 02/13/22 ?1416 02/14/22 ?0450 02/14/22 ?9702 02/16/22 ?6378 02/16/22 ?5885 02/18/22 ?1143 02/19/22 ?0341  ?NA  --    < > 161*   < > 158*   < > 156*   < > 148* 147*  ?K  --   --  4.5   < > 4.5   < > 4.1   < > 4.7 4.4  ?CL  --    < > >130*  --  >130*   < > 127*   < > 118* 116*  ?CO2  --    < > 19*  --  19*   < > 23   < > 26 28  ?GLUCOSE  --    < > 199*  --  237*   < > 232*   < > 173* 136*  ?BUN  --    < > 45*  --  46*   < > 39*   < > 37* 36*  ?CREATININE  --    < > 2.03*  --  1.99*   < > 1.61*   < > 1.23 1.34*  ?CALCIUM  --    < > 8.1*  --  8.3*   < > 9.1   < > 9.2 9.4  ?MG 2.4  --  2.3  --  2.1  --  1.8  --   --   --   ?PHOS 3.6  --  3.4  --   --   --   --   --   --   --   ? < > = values in this interval not displayed.  ? ? ?Lipid Panel:  ?Recent Labs  ?Lab 02/14/22 ?0277   ?TRIG 46  ? ? ?HgbA1c:  ?No results for input(s): HGBA1C in the last 168 hours. ? ?Urine Drug Screen: No results for input(s): LABOPIA, COCAINSCRNUR, LABBENZ, AMPHETMU, THCU, LABBARB in the last 168 hours.  ?Alcohol Level No results for input(s): ETH in the last 168 hours. ? ?IMAGING past 24 hours ?CT HEAD WO CONTRAST (5MM) ? ?Result Date: 02/19/2022 ?CLINICAL DATA:  Hemorrhagic stroke EXAM: CT HEAD WITHOUT CONTRAST TECHNIQUE: Contiguous axial images were  obtained from the base of the skull through the vertex without intravenous contrast. RADIATION DOSE REDUCTION: This exam was performed according to the departmental dose-optimization program which includes automated exposure control, adjustment of the mA and/or kV according to patient size and/or use of iterative reconstruction technique. COMPARISON:  02/15/2022. FINDINGS: Brain: Left superior cerebellar edema and hemorrhage, which are slightly decreased in size and conspicuity. Decreased mass effect on the fourth ventricle, which is more visible on this exam. Redemonstrated hemorrhage in the right occipital ventriculostomy tract. Redemonstrated hypodensity in the right parietal lobe, along the prior ventriculostomy tract, with a small amount of hemorrhage again noted, also slightly decreased in conspicuity. No hydrocephalus or acute extra-axial collection. No acute infarct, mass, or cerebral midline shift. Vascular: No hyperdense vessel. Skull: Left occipital craniectomy. Right parietal burr hole. No acute osseous abnormality. Sinuses/Orbits: Minimal mucosal thickening in the left maxillary sinus. Otherwise negative. The orbits are unremarkable. Other: The mastoids are well aerated. IMPRESSION: 1. Decreased size and conspicuity of the left superior cerebellar hemorrhage with associated edema, with slightly decreased mass effect on the fourth ventricle. 2. Redemonstrated hemorrhage in the right occipital ventriculostomy tract. 3. Slightly decreased density of  hemorrhage in the right parietal ventriculostomy tract, consistent with evolution of blood products. Electronically Signed   By: Merilyn Baba M.D.   On: 02/19/2022 00:11   ? ?PHYSICAL EXAM ?General: Obese elderly Caucasian male patient, ill-appearing ?Respiratory: regular, unlabored respirations on room air ? ?NEURO:  ?Mental Status: AA&Ox3  ?Speech/Language: speech is without dysarthria or aphasia.  Repetition, fluency, and comprehension intact. ?Mild left-sided inattention ?Cranial Nerves:  ?II: PERRL. Left inferior quadrant visual field cut ?III, IV, VI: Right gaze deviation, able  to cross midline briefly.  ?V: Sensation is intact to light touch and symmetrical to face.  ?VII: Smile is symmetrical.   ?VIII: hearing intact to voice. ?IX, X: Phonation is normal.  ?XII: tongue is midline without fasciculations. ?Motor: 5/5 strength to RUE and BLE, LUE 3/5 strength.  Decreased left grip strength.  Diminished fine finger movements on the left and orbits right over left upper extremity. ?Tone: is normal and bulk is normal ?Sensation- Intact to light touch bilaterally.   ?Gait- deferred ? ? ?ASSESSMENT/PLAN ?Vernon [name] is a 76 y.o. male with history of anemia, arthritis, GERD, DM2, HTN and a-fib on Eliquis presenting with vomiting and incoordiantion on the left side.  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  His Eliquis was reversed with [name] and he was transferred here.  His neurological status began to worsen and he was intubated.  Repeat CT demonstrated enlarging ICH.  Neurosurgery was consulted and decompressive suboccipital craniectomy was performed on 3/19 by Dr Ellene Route.  3% HTS was started for cerebral edema. Patient has been extubated since 3/23 and is doing well. Repeat head CT stable, off precedex. Patient has been transferred out of the ICU and is being evaluated for admission to CIR. ? ?ICH:  left cerebellar ICH s/p suboccipital decompression and hematoma evacuation likely due to Eliquis use  and hypertension ?Code Stroke CT head acute 2.2 cm IPH in middle cerebellar peduncle with surrounding edema ?CTA head & neck no LVO or hemodynamically significant stenosis, no evidence of AVM of aneurysm, moderate right vertebral artery stenosis ?Follow up CT 3/18 increased size of left cerebellar IPH with effaced fourth ventricle ?Follow up CT 3/19 s/p left suboccipital craniectomy and cerebellar hematoma evacuation with moderate volume of residual posterior fossa blood ?MRI - 02/12/22 - Left  suboccipital craniectomy for hematoma evacuation. Roughly 13 mL residual left cerebellar blood tracking across midline to the right. Cerebellar edema with partial effacement of the 4th ventricle. But basilar cistern patency is adequate, with no ventriculomegaly. Trace intraventricular and subarachnoid hemorrhage.  ?CT 3/20 and 3/22 stable mass effect and no hydrocephalus ?2D Echo EF 55-60% ?LDL 25 ?HgbA1c 6.2 ?VTE prophylaxis - heparin subcu ?Eliquis (apixaban) daily prior to admission, now on No antithrombotic secondary to IPH ?Therapy recommendations:  CIR ?Disposition:  pending ? ?Obstructive hydrocephalus  ?S/p suboccipital decompression and hematoma evacuation ?CT 3/18 demonstrated effacement of fourth ventricle with developing obstructive hydrocephalus ?Suboccipital craniectomy performed by neurosurgery ?Parieto-occipital hemovac drain placed ?CT 3/20 and 3/22 and MRI 3/20 no hydrocephalus ?On keppra post op  for total 14 days (ends 02/24/22) ? ?Cerebral Edema ?Edema seen surrounding left cerebellar ICH on CT scan with risk for hydrocephalus ?3% HTS at 75 mL/hr->40cc->off->FW 200Q4 ?23.5% bolus given x 1 ?Na  145->149->151->157->156->158->156->161->162->158-> 157-> 154-> 147 ?Allow Na trending down gradually ? ?Hypertension ?Hypotensive ?Home meds:  lisinopril 40 mg daily ?stable ?Currently off norepinephrine ?Long-term BP goal normotensive ? ?Hyperlipidemia ?Home meds:  simvastatin 80 mg daily ?LDL 25, goal < 70 ?Hold off  statin due to low LDL ? ?Diabetes type II Controlled ?Home meds:  metformin 500 mg BID ?HgbA1c 6.2, goal < 7.0 ?CBGs  ?SSI ? ?Respiratory failure ?Pleural effusion ?Patient intubated due to inability to protect

## 2022-02-19 NOTE — Progress Notes (Signed)
VASCULAR LAB ? ? ? ?Left upper extremity venous duplex has been performed. ? ?See CV proc for preliminary results. ? ? ?Rajeev Escue, RVT ?02/19/2022, 3:11 PM ? ?

## 2022-02-19 NOTE — Progress Notes (Signed)
Occupational Therapy Treatment ?Patient Details ?Name: Vernon [name] ?MRN: [id number] ?DOB: [birth year] ?Today's Date: 02/19/2022 ? ? ?History of present illness Pt. is a 76 y.o. male presenting to [facility] on 3/18 with worseing L side coordination along with N/V. CTH demonstrated a 2,2 cm L cerebellar peduncle ICH. He had an epsidoe fo emisis and developed hypoxemia, requiring intubation as well as undergoing an emergent craniotomy on 3/18; hematoma evacuation and drain placement. PMH significant for anemia, arthritis, GERD, DM2, GERD, HTN, afibb on Eliquis. ?  ?OT comments ? Patient received in bed and agreeable to OT/PT session.  Patient making gains with therapy treatment with mod assist +2 to get to EOB and was able to maintain balance from min to mod assist. Patient stood with hand held assist =2 and transferred to recliner with RW and assistance with LUE to grip walker. Patient stood at sink to perform grooming with cues for posture and weight shifting. Patient appears to be good candidate for AIR. Acute OT to continue to follow.   ? ?Recommendations for follow up therapy are one component of a multi-disciplinary discharge planning process, led by the attending physician.  Recommendations may be updated based on patient status, additional functional criteria and insurance authorization. ?   ?Follow Up Recommendations ? Acute inpatient rehab (3hours/day)  ?  ?Assistance Recommended at Discharge Frequent or constant Supervision/Assistance  ?Patient can return home with the following ? Two people to help with walking and/or transfers;Two people to help with bathing/dressing/bathroom;Assistance with cooking/housework;Direct supervision/assist for medications management;Direct supervision/assist for financial management;Assist for transportation;Help with stairs or ramp for entrance ?  ?Equipment Recommendations ? Other (comment) (TBD)  ?  ?Recommendations for Other Services   ? ?  ?Precautions / Restrictions  Precautions ?Precautions: Fall ?Precaution Comments: [name] ?Restrictions ?Weight Bearing Restrictions: No  ? ? ?  ? ?Mobility Bed Mobility ?Overal bed mobility: Needs Assistance ?Bed Mobility: Supine to Sit ?  ?  ?Supine to sit: Mod assist, +2 for physical assistance, HOB elevated ?  ?  ?General bed mobility comments: bed pads used to assist wtih scooting hips to EOB ?  ? ?Transfers ?Overall transfer level: Needs assistance ?Equipment used: 2 person hand held assist, Rolling walker (2 wheels) ?Transfers: Bed to chair/wheelchair/BSC, Sit to/from Stand ?Sit to Stand: Mod assist, +2 physical assistance ?  ?  ?Step pivot transfers: Mod assist, Max assist, +2 physical assistance (a third with chair) ?  ?  ?General transfer comment: stood from EOB with hand held assist +2 ambulated away from bed with RW and assistance with LUE to grip walker, third person for chair follow ?  ?  ?Balance Overall balance assessment: Needs assistance ?Sitting-balance support: Single extremity supported, Feet supported ?Sitting balance-Leahy Scale: Fair ?Sitting balance - Comments: min to mod assist for sitting balance ?Postural control: Posterior lean, Left lateral lean ?Standing balance support: Bilateral upper extremity supported ?Standing balance-Leahy Scale: Poor ?Standing balance comment: mod assist +2 ?  ?  ?  ?  ?  ?  ?  ?  ?  ?  ?  ?   ? ?ADL either performed or assessed with clinical judgement  ? ?ADL Overall ADL's : Needs assistance/impaired ?  ?  ?Grooming: Wash/dry face;Minimal assistance;Standing ?Grooming Details (indicate cue type and reason): assist of 2 to stand at sink ?  ?  ?  ?  ?Upper Body Dressing : Maximal assistance ?Upper Body Dressing Details (indicate cue type and reason): donned gown to cover back ?  ?  ?  ?  ?  ?  ?  ?  ?  ?  General ADL Comments: increased time to follow directions ?  ? ?Extremity/Trunk Assessment Upper Extremity Assessment ?RUE Deficits / Details: 2-5 grossly to RUE.  Weak grasp, unable to  perform hand to mouth, minimal wrist flexion and forearm supination.  Prior RCR. ?RUE Sensation: WNL ?RUE Coordination: decreased fine motor;decreased gross motor ?LUE Deficits / Details: Patient with trace shoulder and bicep, otherwise flaccid. ?LUE Sensation: WNL ?LUE Coordination: decreased fine motor;decreased gross motor ?  ?  ?  ?  ?  ? ?Vision   ?  ?  ?Perception   ?  ?Praxis   ?  ? ?Cognition Arousal/Alertness: Awake/alert ?Behavior During Therapy: Flat affect ?Overall Cognitive Status: Impaired/Different from baseline ?Area of Impairment: Memory, Following commands, Awareness, Problem solving ?  ?  ?  ?  ?  ?  ?  ?  ?Orientation Level: Person, Place ?  ?Memory: Decreased short-term memory ?Following Commands: Follows one step commands with increased time ?  ?Awareness: Intellectual ?Problem Solving: Slow processing ?General Comments: believed he had walked earlier on his own ?  ?  ?   ?Exercises   ? ?  ?Shoulder Instructions   ? ? ?  ?General Comments    ? ? ?Pertinent Vitals/ Pain       Pain Assessment ?Pain Assessment: No/denies pain ?Pain Intervention(s): Monitored during session ? ?Home Living   ?  ?Available Help at Discharge: Family;Available 24 hours/day ?Type of Home: House ?  ?  ?  ?  ?  ?  ?  ?  ?  ?  ?  ?  ?  ?  ? Lives With: Spouse ? ?  ?Prior Functioning/Environment    ?  ?  ?  ?   ? ?Frequency ? Min 2X/week  ? ? ? ? ?  ?Progress Toward Goals ? ?OT Goals(current goals can now be found in the care plan section) ? Progress towards OT goals: Progressing toward goals ? ?Acute Rehab OT Goals ?OT Goal Formulation: Patient unable to participate in goal setting ?Time For Goal Achievement: 02/28/22 ?Potential to Achieve Goals: Fair ?ADL Goals ?Pt Will Perform Grooming: sitting;with mod assist ?Pt Will Transfer to Toilet: with +2 assist;with mod assist ?Additional ADL Goal #1: Pt will complete bed mobility with mod A as a precursor to ADLs ?Additional ADL Goal #2: Pt will maintian midline posture while  sitting EOB with min A for at least 5 minutes to perform ADLs  ?Plan Discharge plan remains appropriate   ? ?Co-evaluation ? ? ? PT/OT/SLP Co-Evaluation/Treatment: Yes ?Reason for Co-Treatment: For patient/therapist safety;To address functional/ADL transfers ?  ?OT goals addressed during session: ADL's and self-care ?  ? ?  ?AM-PAC OT "6 Clicks" Daily Activity     ?Outcome Measure ? ? Help from another person eating meals?: Total ?Help from another person taking care of personal grooming?: A Lot ?Help from another person toileting, which includes using toliet, bedpan, or urinal?: Total ?Help from another person bathing (including washing, rinsing, drying)?: A Lot ?Help from another person to put on and taking off regular upper body clothing?: A Lot ?Help from another person to put on and taking off regular lower body clothing?: Total ?6 Click Score: 9 ? ?  ?End of Session Equipment Utilized During Treatment: Oxygen;Gait belt;Rolling walker (2 wheels) ? ?OT Visit Diagnosis: Repeated falls (R29.6);Other abnormalities of gait and mobility (R26.89);Unsteadiness on feet (R26.81);Muscle weakness (generalized) (M62.81);Pain;Other (comment) ?  ?Activity Tolerance Patient tolerated treatment well ?  ?Patient Left in chair;with call bell/phone within reach;with  chair alarm set ?  ?Nurse Communication Mobility status;Need for lift equipment ?  ? ?   ? ?Time: 4680-3212 ?OT Time Calculation (min): 34 min ? ?Charges: OT General Charges ?$OT Visit: 1 Visit ?OT Treatments ?$Self Care/Home Management : 8-22 mins ? ?Lodema Hong, OTA ?Acute Rehabilitation Services  ?Pager [phone] ?Office [phone] ? ? ?[place] ?02/19/2022, 10:31 AM ?

## 2022-02-19 NOTE — Assessment & Plan Note (Addendum)
Not on any antithrombotics at this time given ICH ?Will need to discuss going forward, per neuro, may need to make decision regarding resuming anticoagulation or alternative treatment, watchman (after he's recovered in abt Lizmary Nader month)? ?

## 2022-02-20 ENCOUNTER — Inpatient Hospital Stay (HOSPITAL_COMMUNITY): Payer: Medicare Other

## 2022-02-20 DIAGNOSIS — I614 Nontraumatic intracerebral hemorrhage in cerebellum: Secondary | ICD-10-CM | POA: Diagnosis not present

## 2022-02-20 DIAGNOSIS — G936 Cerebral edema: Secondary | ICD-10-CM | POA: Diagnosis not present

## 2022-02-20 DIAGNOSIS — G9341 Metabolic encephalopathy: Secondary | ICD-10-CM | POA: Diagnosis not present

## 2022-02-20 DIAGNOSIS — R059 Cough, unspecified: Secondary | ICD-10-CM

## 2022-02-20 LAB — CBC WITH DIFFERENTIAL/PLATELET
Abs Immature Granulocytes: 0.07 10*3/uL (ref 0.00–0.07)
Basophils Absolute: 0.1 10*3/uL (ref 0.0–0.1)
Basophils Relative: 1 %
Eosinophils Absolute: 0.4 10*3/uL (ref 0.0–0.5)
Eosinophils Relative: 6 %
HCT: 31.2 % — ABNORMAL LOW (ref 39.0–52.0)
Hemoglobin: 10 g/dL — ABNORMAL LOW (ref 13.0–17.0)
Immature Granulocytes: 1 %
Lymphocytes Relative: 25 %
Lymphs Abs: 1.7 10*3/uL (ref 0.7–4.0)
MCH: 30.2 pg (ref 26.0–34.0)
MCHC: 32.1 g/dL (ref 30.0–36.0)
MCV: 94.3 fL (ref 80.0–100.0)
Monocytes Absolute: 0.4 10*3/uL (ref 0.1–1.0)
Monocytes Relative: 6 %
Neutro Abs: 4.2 10*3/uL (ref 1.7–7.7)
Neutrophils Relative %: 61 %
Platelets: 162 10*3/uL (ref 150–400)
RBC: 3.31 MIL/uL — ABNORMAL LOW (ref 4.22–5.81)
RDW: 26.4 % — ABNORMAL HIGH (ref 11.5–15.5)
Smear Review: ADEQUATE
WBC: 6.8 10*3/uL (ref 4.0–10.5)
nRBC: 0.6 % — ABNORMAL HIGH (ref 0.0–0.2)

## 2022-02-20 LAB — URINALYSIS, COMPLETE (UACMP) WITH MICROSCOPIC
Bilirubin Urine: NEGATIVE
Glucose, UA: NEGATIVE mg/dL
Hgb urine dipstick: NEGATIVE
Ketones, ur: NEGATIVE mg/dL
Leukocytes,Ua: NEGATIVE
Nitrite: NEGATIVE
Protein, ur: 30 mg/dL — AB
Specific Gravity, Urine: 1.016 (ref 1.005–1.030)
pH: 7 (ref 5.0–8.0)

## 2022-02-20 LAB — COMPREHENSIVE METABOLIC PANEL
ALT: 31 U/L (ref 0–44)
AST: 45 U/L — ABNORMAL HIGH (ref 15–41)
Albumin: 3.3 g/dL — ABNORMAL LOW (ref 3.5–5.0)
Alkaline Phosphatase: 132 U/L — ABNORMAL HIGH (ref 38–126)
Anion gap: 8 (ref 5–15)
BUN: 35 mg/dL — ABNORMAL HIGH (ref 8–23)
CO2: 25 mmol/L (ref 22–32)
Calcium: 9.5 mg/dL (ref 8.9–10.3)
Chloride: 113 mmol/L — ABNORMAL HIGH (ref 98–111)
Creatinine, Ser: 1.36 mg/dL — ABNORMAL HIGH (ref 0.61–1.24)
GFR, Estimated: 54 mL/min — ABNORMAL LOW (ref >60)
Glucose, Bld: 144 mg/dL — ABNORMAL HIGH (ref 70–99)
Potassium: 4.9 mmol/L (ref 3.5–5.1)
Sodium: 146 mmol/L — ABNORMAL HIGH (ref 135–145)
Total Bilirubin: 1.9 mg/dL — ABNORMAL HIGH (ref 0.3–1.2)
Total Protein: 6.5 g/dL (ref 6.5–8.1)

## 2022-02-20 LAB — GLUCOSE, CAPILLARY
Glucose-Capillary: 149 mg/dL — ABNORMAL HIGH (ref 70–99)
Glucose-Capillary: 156 mg/dL — ABNORMAL HIGH (ref 70–99)
Glucose-Capillary: 163 mg/dL — ABNORMAL HIGH (ref 70–99)
Glucose-Capillary: 177 mg/dL — ABNORMAL HIGH (ref 70–99)
Glucose-Capillary: 194 mg/dL — ABNORMAL HIGH (ref 70–99)
Glucose-Capillary: 204 mg/dL — ABNORMAL HIGH (ref 70–99)

## 2022-02-20 LAB — PHOSPHORUS: Phosphorus: 3.8 mg/dL (ref 2.5–4.6)

## 2022-02-20 LAB — BLOOD GAS, VENOUS
Acid-Base Excess: 2.9 mmol/L — ABNORMAL HIGH (ref 0.0–2.0)
Bicarbonate: 27.9 mmol/L (ref 20.0–28.0)
O2 Saturation: 83.7 %
Patient temperature: 37
pCO2, Ven: 43 mmHg — ABNORMAL LOW (ref 44–60)
pH, Ven: 7.42 (ref 7.25–7.43)
pO2, Ven: 51 mmHg — ABNORMAL HIGH (ref 32–45)

## 2022-02-20 LAB — MAGNESIUM: Magnesium: 1.7 mg/dL (ref 1.7–2.4)

## 2022-02-20 MED ORDER — SODIUM CHLORIDE 0.9 % IV BOLUS
500.0000 mL | Freq: Once | INTRAVENOUS | Status: AC
  Administered 2022-02-20: 500 mL via INTRAVENOUS

## 2022-02-20 NOTE — Progress Notes (Signed)
?PROGRESS NOTE ? ? ? ?Vernon [name]  AJO:[id number] DOB: 10-08-1946 DOA: 02/10/2022 ?PCP: Baxter Hire, MD  ?No chief complaint on file. ? ? ?Brief Narrative:  ?76 yo with hx atrial fibrillation on eliquis, T2DM, anemia, GERD, aortic stenosis, MDS and multiple other medical problems presented with weakness and nausea, found to have Haevyn Ury cerebellar hemorrhage.  He required intubation for airway protection and underwent emergent craniotomy on 3/18 with hematoma evacuation and drain placement.   ? ?Significant Events ?3/18 admission, intubation for airway protection after vomiting, underwent Suboccipital craniectomy and evacuation of cerebellar hemorrhage.  Placement of right parieto-occipital ventriculostomy by Dr. Ellene Route ?3/19 repeat CT head > s/p left suboccipital craniectomy and cerebellar hematoma evacuation with moderate residual volume of posterior fossa blood, improved patency of 4th ventricle, decreased lateral and 3rd ventricle size; EVD in place, otherwise stable CT ?3/20  MRI Brain>>Left suboccipital craniectomy for hematoma evacuation. Roughly 13 mL residual left cerebellar blood tracking across midline to the right. Cerebellar edema with partial effacement of the 4th ventricle. But basilar cistern patency is adequate, with no ventriculomegaly. Sequelae of attempted EVD placement across the splenium of the corpus callosum. Trace intraventricular and subarachnoid hemorrhage. Background mild to moderate nonspecific cerebral white matter signal changes. 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 ?3/21: patient on PSV; thoracentesis for R pleural effusion; off pressors; sedation switched to cleviprex; hypertonic saline stopped due to hypernatremia; started on unasyn, trach asp sent ?3/22: pulled EVD out; NSG aware and will just continue to monitor; extubated ?3/23: remains on precedex; off levo, transfused 1 unit PRBC, repeat CTH stable ?3/27 TRH now  primary ?Worsening mental status and weakness today, head CT pending ?  ? ? ?Assessment & Plan: ?  ?Principal Problem: ?  ICH (intracerebral hemorrhage) ([place]) ?Active Problems: ?  Acute metabolic encephalopathy ?  Cerebral edema (HCC) ?  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  Dysphagia ?  Pleural effusion on right ?  Respiratory failure requiring intubation ([place]) ?  Cough ?  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  Chronic atrial fibrillation (HCC) ?  AKI (acute kidney injury) ([place]) ?  Essential hypertension ?  Type 2 diabetes mellitus with hyperglycemia (HCC) ?  Dyslipidemia ?  Hyperkalemia ?  Anemia ? ? ?Assessment and Plan: ?* ICH (intracerebral hemorrhage) ([place]) ?Presented to [facility] 3/18 with weakness and nausea, found to have cerebellar hemorrhage 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 and HTN ?Head CT 3/18 with acute 2.2 cm intraparencymal hemorrhage in the L middle cerebellar peduncle with probable small volume adjacent extra axial extension, surrounding edema with mild effacement of 4th ventricle, no hydrocephalus ?CTA head/neck 3/18 without LVO, no evidence of vascular malformation or aneurysm in region of acute hemorrhage -> consider f/u after resolution of hemorrhage -- nondominant right verterbral artery origin stenosis, mild atherosclerosis ?CT 3/18 with increased size of intraparenchymal hematoma, increased subdural blood ?3/19 head CT s/p L suboccipital craniectomy and cerebellar hematoma evaculation with Toussaint Golson moderate residual volume of posterior fossa blood, but improved patency of the 4th ventricle and decreased lateral and 3rd bentricle size, sequelae of temporary R parieto occipital EVD placement ?3/20 MRI brain L suboccipital craniectomy for hematoma evacuation, 13 ml residual L cerebellar blood tracking across midline to R, cerebellar edema with partial effacement of the 4th ventricle - sequelae of attempted EVD placement across splenium of corpus collosum ?3/20 head CT with no  unexpected or new unfavorable finding ?3/23 head CT with no interval progression of  cerebellar edema and hemorrhage ?3/26 head CT with decreased size and conspicuity of L superior cerebellar hemorrhage with associated edema, with slightly decreased mass effect on the 4th ventricle, redemonstrated hemorrahge in the R occipital ventriculostomy tract, slightly decreased density of hemorrhage in parietal ventriculostomy tract  ?Repeat head CT 3/28 with worsening mental status, worsening weakness (LUE flaccid today) ?Discussed with neuro who is coming to eval ?No antithrombotic 2/2 ICH ?LDL 25, no statin, A1c 6.2 ?Echo EF 55-60%, no RWMA ? ? ?Acute metabolic encephalopathy ?Related to above ?Repeat head CT without acute findings ?UA pending to r/o infection ?CXR unremarkable ?Will check VBG ?Will continue to workup as indicated ?Afebrile, wbc wnl ?Delirium precautions ? 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 ? ?Cerebral edema (HCC) ?S/p hypertonic saline ?Now off, Na trending down ?Na 146 today ? ?Dysphagia ?cortrak in place, SLP recs appreciated ? ?Cough ?Remains on RA, worsening cough today with AMS ?Follow stat CXR ? ?Respiratory failure requiring intubation ([place]) ?Now extubated on RA, follow CXR 3/28 - no acute cardiopulm abnormality ?S/p unasyn for concern for aspiration pneumonia ? ?Pleural effusion on right ?CXR 2/24 with small symmetric effusions ?Will follow CXR 2/28 ?S/p thoracentesis, consistent with transudate ?Cytology with no malignant cells ? ?AKI (acute kidney injury) ([place]) ?Improved, follow ? ?Chronic atrial fibrillation (HCC) ?Not on any antithrombotics at this time given ICH ?Will need to discuss going forward, per neuro, may need to make decision regarding  resuming anticoagulation or alternative treatment, watchman (after he's recovered in abt Blaise Palladino month)? ? 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 Rogue Bussing, follow outpatient ? ?Dyslipidemia ?simvastatin ? ?Type 2 diabetes mellitus with hyperglycemia (HCC) ?Basal, bolus ?SSI ? ?Essential hypertension ?Coreg ?Prn labetalol ? ? ?DVT prophylaxis: heparin ?Code Status: full ?Family Communication: none ?Disposition:  ? ?Status is: Inpatient ?Remains inpatient appropriate because: awaiting further improvement ?  ?Consultants:  ?Neuro ?neurosurgery ? ?Procedures:  ?3/19 ? Suboccipital craniectomy and evacuation of cerebellar hemorrhage.  Placement of right parieto-occipital ventriculostomy ? ?Echo ?IMPRESSIONS  ? ? ? 1. Left ventricular ejection fraction, by estimation, is 55 to 60%. The  ?left ventricle has normal function. The left ventricle has no regional  ?wall motion abnormalities. There is moderate left ventricular hypertrophy.  ?Left ventricular diastolic  ?parameters are indeterminate.  ? 2. Right ventricular systolic function is normal. The right ventricular  ?size is normal. There is moderately elevated pulmonary artery systolic  ?pressure.  ? 3. Left atrial size was severely dilated.  ? 4. Right atrial size was mild to moderately dilated.  ? 5. Kyon Bentler small pericardial effusion is present. There is no evidence of  ?cardiac tamponade.  ? 6. Mild mitral valve regurgitation.  ? 7. There is moderate calcification of the aortic valve. Aortic valve  ?regurgitation is not visualized. Mild aortic valve stenosis. Aortic valve  ?area, by VTI measures 1.03 cm?Marland Kitchen Aortic valve mean gradient measures 19.0  ?mmHg.  ? 8. The inferior vena cava is dilated in size with <50% respiratory  ?variability, suggesting right atrial pressure of 15 mmHg. ? ? ?Antimicrobials:  ?Anti-infectives (From admission, onward)  ? ? Start     Dose/Rate Route Frequency Ordered Stop  ? 02/13/22 1130  Ampicillin-Sulbactam (UNASYN) 3 g in sodium  chloride 0.9 % 100 mL IVPB       ? 3 g ?200 mL/hr  over 30 Minutes Intravenous Every 8 hours 02/13/22 1032 02/18/22 0459  ? ?  ? ? ?Subjective: ?More lethargic today ? ?Objective: ?Vitals:  ? 02/20/22 0856 02/20/22 1036 03

## 2022-02-20 NOTE — Progress Notes (Signed)
Inpatient Rehab Admissions Coordinator:  ? ?Notified by [employer] that request for CIR has been denied.  Met with pt/wife and MD at bedside and we will pursue an expedited appeal.  I have faxed the info to [employer] for review.  Will follow for determination.  ? ? , PT, DPT ?Admissions Coordinator ?[phone] ?02/20/22  ?11:44 AM ? ?

## 2022-02-20 NOTE — Assessment & Plan Note (Addendum)
Remains on RA, worsening cough with AMS noted on 02/20/2022. ?-Repeat chest x-ray no acute abnormalities. ?-Symptomatic treatment. ?

## 2022-02-20 NOTE — H&P (Incomplete)
? ? ?Physical Medicine and Rehabilitation Admission H&P ? ?  ?No chief complaint on file. ?: ?HPI: *** ? ?ROS ?Past Medical History:  ?Diagnosis Date  ? Anemia   ? Aortic stenosis   ? Arthritis   ? Complication of anesthesia   ? hard time getting bp up after knee replacement  ? Diabetes mellitus without complication ([place])   ? GERD (gastroesophageal reflux disease)   ? occ tums prn  ? History of hiatal hernia   ? Hypertension   ? 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[place])   ? ?Past Surgical History:  ?Procedure Laterality Date  ? CARPAL TUNNEL RELEASE  2012  ? CRANIOTOMY N/A 02/10/2022  ? Procedure: SUBOCCIPITAL CRANIECTOMY FOR EVACUATION OF CEREBELLAR HEMATOMA;  Surgeon: Kristeen Miss, MD;  Location: [facility];  Service: Neurosurgery;  Laterality: N/A;  ? JOINT REPLACEMENT Right 2010  ? SHOULDER ARTHROSCOPY WITH ROTATOR CUFF REPAIR AND OPEN BICEPS TENODESIS Right 11/09/2019  ? Procedure: RIGHT SHOULDER ARTHROSCOPY WITH SUBSCAPULARIS REPAIR, SUBACROMIAL DECOMPRESSION,MINI OPEN ROTATOR CUFF REPAIR;  Surgeon: Leim Fabry, MD;  Location: [facility];  Service: Orthopedics;  Laterality: Right;  ? ?Family History  ?Problem Relation Age of Onset  ? Cancer Maternal Uncle   ? ?Social History:  reports that he quit smoking about 43 years ago. His smoking use included cigarettes. He has a 4.00 pack-year smoking history. He has never used smokeless tobacco. He reports current alcohol use. He reports that he does not use drugs. ?Allergies: No Known Allergies ?Medications Prior to Admission  ?Medication Sig Dispense Refill  ? acetaminophen (TYLENOL) 650 MG CR tablet Take 650 mg by mouth every 8 (eight) hours as needed for pain.    ? allopurinol (ZYLOPRIM) 300 MG tablet Take 300 mg by mouth at bedtime.     ? apixaban (ELIQUIS) 5 MG TABS tablet Take 5 mg by mouth 2 (two) times daily.     ? ferrous sulfate 325 (65 FE) MG EC tablet Take 325 mg by mouth daily.    ? lisinopril (ZESTRIL) 40 MG tablet Take 40 mg by mouth at bedtime.     ? metFORMIN  (GLUCOPHAGE) 500 MG tablet Take 500 mg by mouth 2 (two) times daily with a meal.     ? Multiple Vitamins-Minerals (CENTRUM SILVER PO) Take 1 tablet by mouth daily.    ? simvastatin (ZOCOR) 80 MG tablet Take 40 mg by mouth daily at 6 PM.     ? ? ? ? ?Home: ?Home Living ?Family/patient expects to be discharged to:: Private residence ?Living Arrangements: Spouse/significant other ?Available Help at Discharge: Family, Available 24 hours/day ?Type of Home: House ?Home Access: Stairs to enter ?Entrance Stairs-Number of Steps: 5 ?Entrance Stairs-Rails: Left, Right ?Home Layout: Multi-level, Able to live on main level with bedroom/bathroom ?Bathroom Shower/Tub: Walk-in shower ?Bathroom Toilet: Standard ?Home Equipment: None ?Additional Comments: supportive family, wife wroks full time ? Lives With: Spouse ?  ?Functional History: ?Prior Function ?Prior Level of Function : Independent/Modified Independent, Working/employed ?Mobility Comments: no AD ?ADLs Comments: drives, retired ? ?Functional Status:  ?Mobility: ?Bed Mobility ?Overal bed mobility: Needs Assistance ?Bed Mobility: Supine to Sit ?Supine to sit: Mod assist, +2 for physical assistance, HOB elevated ?Sit to supine: Mod assist, HOB elevated ?General bed mobility comments: bed pads used to assist wtih scooting hips to EOB, pt began bringing LEs off EOB with verbal cues, modAx2 for trunk elevation, difficulty using L UE functionally ?Transfers ?Overall transfer level: Needs assistance ?Equipment used: 2 person hand held assist, Rolling walker (2  wheels) ?Transfers: Bed to chair/wheelchair/BSC, Sit to/from Stand ?Sit to Stand: Mod assist, +2 physical assistance ?Bed to/from chair/wheelchair/BSC transfer type:: Step pivot ?Step pivot transfers: Mod assist, Max assist, +2 physical assistance (a third with chair) ?General transfer comment: stood from EOB with hand held assist +2 ambulated away from bed with RW and assistance with LUE to grip walker, third person for  chair follow ?Ambulation/Gait ?Pre-gait activities: began marching in place with maxA for weight shifting L/R and max directional verbal cues via bilat HHA. Pt then given RW to progress gait however pt with significant leaning and unable to pick up feet to ambulate, RN brough chair up behind patient ?  ? ?ADL: ?ADL ?Overall ADL's : Needs assistance/impaired ?Eating/Feeding: NPO ?Grooming: Wash/dry face, Minimal assistance, Standing ?Grooming Details (indicate cue type and reason): assist of 2 to stand at sink ?Upper Body Dressing : Maximal assistance ?Upper Body Dressing Details (indicate cue type and reason): donned gown to cover back ?Functional mobility during ADLs: Total assistance, +2 for physical assistance, +2 for safety/equipment ?General ADL Comments: increased time to follow directions ? ?Cognition: ?Cognition ?Overall Cognitive Status: Impaired/Different from baseline ?Arousal/Alertness: Awake/alert ?Orientation Level: Oriented to person, Oriented to place, Disoriented to time, Oriented to situation ?Year: 2023 ?Month: April ?Day of Week: Incorrect ?Attention: Focused, Sustained ?Focused Attention: Impaired ?Focused Attention Impairment: Verbal complex ?Memory: Impaired ?Memory Impairment: Retrieval deficit, Decreased recall of new information (Immediate: 5/5 with repetion x3; delayed: 2/5; with cues: 2/3) ?Awareness: Impaired ?Awareness Impairment: Emergent impairment ?Problem Solving: Impaired ?Problem Solving Impairment: Verbal complex (Money: 1/3; time: 0/1) ?Executive Function: Sequencing, Organizing ?Organizing: Impaired ?Organizing Impairment: Verbal complex (Backward digit span: 0/2) ?Cognition ?Arousal/Alertness: Awake/alert ?Behavior During Therapy: WFL for tasks assessed/performed (more interactive today with engaging conversation) ?Overall Cognitive Status: Impaired/Different from baseline ?Area of Impairment: Memory, Awareness, Problem solving, Following commands,  Safety/judgement ?Orientation Level: Disoriented to, Time ?Memory: Decreased short-term memory ?Following Commands: Follows one step commands with increased time, Follows one step commands inconsistently ?Safety/Judgement: Decreased awareness of safety, Decreased awareness of deficits (L sided inattention) ?Awareness: Intellectual ?Problem Solving: Slow processing, Difficulty sequencing, Requires verbal cues, Requires tactile cues ?General Comments: pt confused despite know he's in the hospital. pt stating he was up walking around prior to PT/OT coming and also commenting about how he wanted to go up to [place] to bring back [employer] things. ?Difficult to assess due to: Level of arousal ? ?Physical Exam: ?Blood pressure (!) 123/92, pulse 60, temperature (!) 97.5 ?F (36.4 ?C), temperature source Oral, resp. rate (!) 22, height '5\' 9"'$  (1.753 m), weight 91.9 kg, SpO2 97 %. ?Physical Exam ? ?Results for orders placed or performed during the hospital encounter of 02/10/22 (from the past 48 hour(s))  ?CBC with Differential/Platelet     Status: Abnormal  ? Collection Time: 02/18/22 11:43 AM  ?Result Value Ref Range  ? WBC 4.9 4.0 - 10.5 K/uL  ? RBC 2.99 (L) 4.22 - 5.81 MIL/uL  ? Hemoglobin 9.1 (L) 13.0 - 17.0 g/dL  ? HCT 28.8 (L) 39.0 - 52.0 %  ? MCV 96.3 80.0 - 100.0 fL  ? MCH 30.4 26.0 - 34.0 pg  ? MCHC 31.6 30.0 - 36.0 g/dL  ? RDW 26.7 (H) 11.5 - 15.5 %  ? Platelets 149 (L) 150 - 400 K/uL  ?  Comment: REPEATED TO VERIFY  ? nRBC 0.6 (H) 0.0 - 0.2 %  ? Neutrophils Relative % 63 %  ? Neutro Abs 3.1 1.7 - 7.7 K/uL  ? Lymphocytes  Relative 24 %  ? Lymphs Abs 1.2 0.7 - 4.0 K/uL  ? Monocytes Relative 2 %  ? Monocytes Absolute 0.1 0.1 - 1.0 K/uL  ? Eosinophils Relative 10 %  ? Eosinophils Absolute 0.5 0.0 - 0.5 K/uL  ? Basophils Relative 1 %  ? Basophils Absolute 0.0 0.0 - 0.1 K/uL  ? nRBC 0 0 /100 WBC  ? Abs Immature Granulocytes 0.00 0.00 - 0.07 K/uL  ? Tear Drop Cells PRESENT   ? Polychromasia PRESENT   ? Target  Cells PRESENT   ?  Comment: Performed at [facility], [place] [address] [place], [place] [address]  ?Basic metabolic panel     Status: Abnormal  ? Collection Time: 02/18/22 11:43 AM  ?Result Value Ref Range  ? Sodium 148 (H) 135 -

## 2022-02-20 NOTE — Progress Notes (Signed)
Patient noted to be lethargic this more with increased NIH score. Dr Florene Glen notified and on unit to assess patient. New Orders received  ?

## 2022-02-20 NOTE — Progress Notes (Signed)
STROKE TEAM PROGRESS NOTE  ? ?INTERVAL HISTORY ?Patient is seen in his room with his wife at the bedside.  He has shown some neurological worsening with some drowsiness as well as increased left upper extremity weakness.  He has not spiked any fever.  Vital signs are stable.  Blood pressure adequately controlled.  Stat repeat follow-up CT scan from this morning shows no acute abnormality with decrease size and conspicuity of left superior cerebellar hemorrhage with associated edema with decreased mass effect on the fourth ventricle.  Chest x-ray shows no infiltrates.  CMP and CBC unremarkable.  . ? ? ?Vitals:  ? 02/20/22 0814 02/20/22 0856 02/20/22 1036 02/20/22 1133  ?BP: (!) 147/84  (!) 123/92 133/79  ?Pulse: (!) 59 60 60 (!) 55  ?Resp: 18  (!) 22 20  ?Temp: 98.4 ?F (36.9 ?C)  (!) 97.5 ?F (36.4 ?C) 97.9 ?F (36.6 ?C)  ?TempSrc: Oral  Oral Oral  ?SpO2: 96%  97% 99%  ?Weight:      ?Height:      ? ?CBC:  ?Recent Labs  ?Lab 02/19/22 ?0341 02/20/22 ?5397  ?WBC 5.2 6.8  ?NEUTROABS 2.9 4.2  ?HGB 9.2* 10.0*  ?HCT 28.2* 31.2*  ?MCV 94.6 94.3  ?PLT 156 162  ? ?Basic Metabolic Panel:  ?Recent Labs  ?Lab 02/16/22 ?0223 02/16/22 ?0756 02/19/22 ?0341 02/20/22 ?6734  ?NA 156*   < > 147* 146*  ?K 4.1   < > 4.4 4.9  ?CL 127*   < > 116* 113*  ?CO2 23   < > 28 25  ?GLUCOSE 232*   < > 136* 144*  ?BUN 39*   < > 36* 35*  ?CREATININE 1.61*   < > 1.34* 1.36*  ?CALCIUM 9.1   < > 9.4 9.5  ?MG 1.8  --   --  1.7  ?PHOS  --   --   --  3.8  ? < > = values in this interval not displayed.  ? ?Lipid Panel:  ?Recent Labs  ?Lab 02/14/22 ?1937  ?TRIG 46  ? ?HgbA1c:  ?No results for input(s): HGBA1C in the last 168 hours. ? ?Urine Drug Screen: No results for input(s): LABOPIA, COCAINSCRNUR, LABBENZ, AMPHETMU, THCU, LABBARB in the last 168 hours.  ?Alcohol Level No results for input(s): ETH in the last 168 hours. ? ?IMAGING past 24 hours ?DG Chest 2 View ? ?Result Date: 02/20/2022 ?CLINICAL DATA:  Left cerebral intracranial hemorrhage. EXAM: CHEST - 2  VIEW COMPARISON:  02/20/2022, earlier today. FINDINGS: There is a enteric tube with tip coursing below the level of the hemidiaphragms. Stable cardiac enlargement. Aortic atherosclerosis. Lungs are hypoinflated but clear. No pleural effusion or edema. The visualized osseous structures are unremarkable. IMPRESSION: No active cardiopulmonary abnormalities. Electronically Signed   By: Kerby Moors M.D.   On: 02/20/2022 12:37  ? ?CT HEAD WO CONTRAST (5MM) ? ?Result Date: 02/20/2022 ?CLINICAL DATA:  Neuro deficit, acute, stroke suspected. EXAM: CT HEAD WITHOUT CONTRAST TECHNIQUE: Contiguous axial images were obtained from the base of the skull through the vertex without intravenous contrast. RADIATION DOSE REDUCTION: This exam was performed according to the departmental dose-optimization program which includes automated exposure control, adjustment of the mA and/or kV according to patient size and/or use of iterative reconstruction technique. COMPARISON:  Prior head CT examinations 02/18/2022 and earlier. Brain MRI 02/12/2022. FINDINGS: Brain: Continued interval evolution of postoperative changes from prior left suboccipital craniectomy and left posterior fossa hematoma evacuation. Residual hemorrhage within the left cerebellar hemisphere, similar to slightly  decreased from the prior head CT of 02/18/2022. Left cerebellar edema has not significantly changed. Stable mass effect with partial effacement of the fourth ventricle. Persistent edema and trace persistent hemorrhage within the right occipital lobe along the tract from prior attempted ventricular catheter placement. Mild patchy and ill-defined hypoattenuation within the cerebral white matter elsewhere, nonspecific but compatible with chronic small vessel ischemic disease. No interval acute intracranial hemorrhage. New acute demarcated cortical infarction. No hydrocephalus or supratentorial midline shift. Vascular: No hyperdense vessel. Atherosclerotic  calcifications. Skull: Right occipital burr hole with overlying skin staples. Sinuses/Orbits: Visualized orbits show no acute finding. Minimal mucosal thickening and small mucous retention cyst within the left maxillary sinus. Minimal mucosal thickening versus fluid within the left sphenoid sinus. Minimal mucosal thickening within the bilateral ethmoid sinuses. IMPRESSION: No evidence of interval acute intracranial abnormality. Continued evolution of postoperative changes from prior left suboccipital craniectomy and left posterior fossa hematoma evacuation. Residual hemorrhage within the left cerebellar hemisphere, similar to slightly decreased from the prior head CT of 02/18/2022. Unchanged left cerebellar edema. Stable mass effect with partial effacement of the fourth ventricle. No evidence of hydrocephalus. Persistent edema and trace persistent hemorrhage within the right occipital lobe along the tract from prior attempted ventricular catheter placement. Stable chronic small vessel ischemic changes within the cerebral white matter. Paranasal sinus disease, as described. Electronically Signed   By: Kellie Simmering D.O.   On: 02/20/2022 12:39  ? ?DG CHEST PORT 1 VIEW ? ?Result Date: 02/20/2022 ?CLINICAL DATA:  Encounter for Abnormal CXR EXAM: PORTABLE CHEST 1 VIEW COMPARISON:  March 24, 23. FINDINGS: Previously-seen opacities have resolved. No visible pleural effusions or pneumothorax. Enteric tube courses below the diaphragm with the tip outside the field of view. Similar enlargement of the cardiac silhouette. IMPRESSION: 1. Previously-seen opacities have resolved. No evidence of acute cardiopulmonary abnormality. 2. Similar cardiomegaly. Electronically Signed   By: Margaretha Sheffield M.D.   On: 02/20/2022 08:05   ? ?PHYSICAL EXAM ?General: Obese elderly Caucasian male patient, ill-appearing ?Respiratory: regular, unlabored respirations on room air ? ?NEURO:  ?Mental Status: Drowsy but can be aroused and follows  commands. ?Speech/Language: speech is without dysarthria or aphasia.  Repetition, fluency, and comprehension intact. ?Mild left-sided inattention ?Cranial Nerves:  ?II: PERRL. Left inferior quadrant visual field cut ?III, IV, VI: Right gaze deviation, able  to cross midline briefly.  ?V: Sensation is intact to light touch and symmetrical to face.  ?VII: Smile is symmetrical.   ?VIII: hearing intact to voice. ?IX, X: Phonation is normal.  ?XII: tongue is midline without fasciculations. ?Motor: 5/5 strength to RUE and BLE, LUE 1-2/5 strength.  Significant weakness of left grip and fingers.  Tone: is normal and bulk is normal ?Sensation- Intact to light touch bilaterally.   ?Gait- deferred ? ? ?ASSESSMENT/PLAN ?[name] is a 76 y.o. male with history of anemia, arthritis, GERD, DM2, HTN and a-fib on Eliquis presenting with vomiting and incoordiantion on the left side.  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  His Eliquis was reversed with [name] and he was transferred here.  His neurological status began to worsen and he was intubated.  Repeat CT demonstrated enlarging ICH.  Neurosurgery was consulted and decompressive suboccipital craniectomy was performed on 3/19 by Dr Ellene Route.  3% HTS was started for cerebral edema. Patient has been extubated since 3/23 and is doing well. Repeat head CT stable, off precedex. Patient has been transferred out of the ICU and is being evaluated  for admission to CIR. ? ?ICH:  left cerebellar ICH s/p suboccipital decompression and hematoma evacuation likely due to Eliquis use and hypertension ?Code Stroke CT head acute 2.2 cm IPH in middle cerebellar peduncle with surrounding edema ?CTA head & neck no LVO or hemodynamically significant stenosis, no evidence of AVM of aneurysm, moderate right vertebral artery stenosis ?Follow up CT 3/18 increased size of left cerebellar IPH with effaced fourth ventricle ?Follow up CT 3/19 s/p left suboccipital craniectomy and cerebellar  hematoma evacuation with moderate volume of residual posterior fossa blood ?MRI - 02/12/22 - Left suboccipital craniectomy for hematoma evacuation. Roughly 13 mL residual left cerebellar blood tracking across midline to the ri

## 2022-02-20 NOTE — Progress Notes (Signed)
Physical Therapy Treatment ?Patient Details ?Name: Vernon [name] ?MRN: [id number] ?DOB: 06-09-1946 ?Today's Date: 02/20/2022 ? ? ?History of Present Illness Pt. is a 76 y.o. male presenting [facility] with worseing L side coordination along with N/V. CTH demonstrated a 2,2 cm L cerebellar peduncle ICH. He had an epsidoe fo emisis and developed hypoxemia, requiring intubation as well as undergoing an emergent craniotomy on 3/18; hematoma evacuation and drain placement. PMH significant for anemia, arthritis, GERD, DM2, GERD, HTN, afibb on Eliquis. ? ?  ?PT Comments  ? ? Pt with increased lethargy today and minimal use of bilat UEs today. Pt requiring totalAx2 for EOB transfer today and was unable to maintain EOB balance or eyes open. Pt was standing and began gait progression however pt unable today. RN notified. Acute PT to cont to follow. ?   ?Recommendations for follow up therapy are one component of a multi-disciplinary discharge planning process, led by the attending physician.  Recommendations may be updated based on patient status, additional functional criteria and insurance authorization. ? ?Follow Up Recommendations ? Acute inpatient rehab (3hours/day) ?  ?  ?Assistance Recommended at Discharge Intermittent Supervision/Assistance  ?Patient can return home with the following Two people to help with walking and/or transfers;Two people to help with bathing/dressing/bathroom;Assistance with cooking/housework;Assistance with feeding;Direct supervision/assist for financial management;Direct supervision/assist for medications management;Assist for transportation;Help with stairs or ramp for entrance ?  ?Equipment Recommendations ? Wheelchair (measurements PT);Wheelchair cushion (measurements PT);Hospital bed;Other (comment)  ?  ?Recommendations for Other Services Rehab consult ? ? ?  ?Precautions / Restrictions Precautions ?Precautions: Fall ?Precaution Comments: [name] ?Restrictions ?Weight Bearing Restrictions:  No  ?  ? ?Mobility ? Bed Mobility ?Overal bed mobility: Needs Assistance ?Bed Mobility: Supine to Sit, Sit to Supine ?  ?  ?Supine to sit: Total assist, +2 for physical assistance ?Sit to supine: Total assist, +2 for physical assistance ?  ?General bed mobility comments: to command pt attempted to move R LE however minimally. Pt dependent via bed pads for trunk elevation and to bring hips to EOB, pt with no functional use of bilat hands ?  ? ?Transfers ?  ?  ?  ?  ?  ?  ?  ?  ?  ?General transfer comment: pt to lethargic to attempt today ?  ? ?Ambulation/Gait ?  ?  ?  ?  ?  ?  ?  ?General Gait Details: unable this date ? ? ?Stairs ?  ?  ?  ?  ?  ? ? ?Wheelchair Mobility ?  ? ?Modified Rankin (Stroke Patients Only) ?Modified Rankin (Stroke Patients Only) ?Pre-Morbid Rankin Score: No symptoms ?Modified Rankin: Severe disability ? ? ?  ?Balance Overall balance assessment: Needs assistance ?Sitting-balance support: Single extremity supported, Feet supported ?Sitting balance-Leahy Scale: Poor ?Sitting balance - Comments: pt unable to maintain EOB balance, strong R lateral lean requiring max verbal and tactile cues to try to maintain midline, however pt unable, requiring maxA ?  ?  ?  ?Standing balance comment: unable to attempt today ?  ?  ?  ?  ?  ?  ?  ?  ?  ?  ?  ?  ? ?  ?Cognition Arousal/Alertness: Lethargic ?Behavior During Therapy: Flat affect ?Overall Cognitive Status: Impaired/Different from baseline ?  ?  ?  ?  ?  ?  ?  ?  ?  ?  ?  ?  ?  ?  ?  ?  ?General Comments: pt very lethargic today requiring  max verbal and tactile cues to try to keep pt awake, pt minimally verbal, no initiation of task today ?  ?  ? ?  ?Exercises   ? ?  ?General Comments General comments (skin integrity, edema, etc.): VSS ?  ?  ? ?Pertinent Vitals/Pain Pain Assessment ?Pain Assessment: No/denies pain  ? ? ?Home Living   ?  ?  ?  ?  ?  ?  ?  ?  ?  ?   ?  ?Prior Function    ?  ?  ?   ? ?PT Goals (current goals can now be found in the care  plan section) Progress towards PT goals: Not progressing toward goals - comment (due to lethargy) ? ?  ?Frequency ? ? ? Min 4X/week ? ? ? ?  ?PT Plan Current plan remains appropriate  ? ? ?Co-evaluation   ?  ?  ?  ?  ? ?  ?AM-PAC PT "6 Clicks" Mobility   ?Outcome Measure ? Help needed turning from your back to your side while in a flat bed without using bedrails?: Total ?Help needed moving from lying on your back to sitting on the side of a flat bed without using bedrails?: Total ?Help needed moving to and from a bed to a chair (including a wheelchair)?: Total ?Help needed standing up from a chair using your arms (e.g., wheelchair or bedside chair)?: Total ?Help needed to walk in hospital room?: Total ?Help needed climbing 3-5 steps with a railing? : Total ?6 Click Score: 6 ? ?  ?End of Session Equipment Utilized During Treatment: Oxygen ?Activity Tolerance: Patient limited by lethargy ?Patient left: in bed;with call bell/phone within reach;with bed alarm set ?Nurse Communication: Mobility status (informed RN of increased lethargy and minimal use of bilat UEs) ?PT Visit Diagnosis: Muscle weakness (generalized) (M62.81);Difficulty in walking, not elsewhere classified (R26.2) ?  ? ? ?Time: 0973-5329 ?PT Time Calculation (min) (ACUTE ONLY): 20 min ? ?Charges:  $Therapeutic Activity: 8-22 mins          ?          ? ?Kittie Plater, PT, DPT ?Acute Rehabilitation Services ?Pager #: [phone] ?Office #: [phone] ? ? ? ?Paula Zietz M Ruie Sendejo ?02/20/2022, 1:23 PM ? ?

## 2022-02-20 NOTE — Assessment & Plan Note (Addendum)
-  Secondary to above.  ?Repeat head CT done 02/20/2022 with no evidence of interval acute intracranial abnormality, continued evolution of postop changes from prior left suboccipital craniectomy and left posterior fossa hematoma evacuation.  Residual hemorrhage within the left cerebellar hemisphere similar to slightly decreased from prior head CT of 02/18/2022.  Unchanged left cerebellar edema.  Stable mass effect with partial effacement of fourth ventricle.  No evidence of hydrocephalus. ?-Chest x-ray done unremarkable. ?-Nitrite negative leukocytes negative, 0-5 WBCs. ?-VBG with a pH of 7.42, PCO2 of 43, PO2 of 51. ?-Neurology following. ?-Status post gentle hydration. ?-Seroquel was changed to as needed and subsequently discontinued. ?Afebrile, wbc wnl ?Delirium precautions ?-Patient noted to be agitated 02/24/2022 and per RN received some Seroquel overnight with no significant improvement. ?-Zyprexa 2.5 mg twice daily and uptitrate as needed for better control of agitation. ?-Patient with waxing and waning mental status, with improvement in mental status with Zyprexa which could be further uptitrated.   ?-Patient be discharged to inpatient rehab.   ?-Patient seen by palliative care during this hospitalization and will need outpatient palliative care services postdischarge from inpatient rehab.   ?

## 2022-02-20 NOTE — Progress Notes (Signed)
Patient ID: Vernon [name], male   DOB: 1946-01-04, 76 y.o.   MRN: [id number] ?Patient is much more alert than he has been ?He appears to be improving substantially ?His CT shows typical evolutionary changes from the cerebellar hematoma status postevacuation.  Right occipital edema from the ventricular catheter is substantial and should resolve on its own.  At this point his staples can be removed and he is ready for CIR.  I will remain available should any needs arise. ?

## 2022-02-21 DIAGNOSIS — N179 Acute kidney failure, unspecified: Secondary | ICD-10-CM | POA: Diagnosis not present

## 2022-02-21 DIAGNOSIS — D469 Myelodysplastic syndrome, unspecified: Secondary | ICD-10-CM

## 2022-02-21 DIAGNOSIS — D6189 Other specified aplastic anemias and other bone marrow failure syndromes: Secondary | ICD-10-CM | POA: Diagnosis not present

## 2022-02-21 DIAGNOSIS — I614 Nontraumatic intracerebral hemorrhage in cerebellum: Secondary | ICD-10-CM | POA: Diagnosis not present

## 2022-02-21 DIAGNOSIS — G9341 Metabolic encephalopathy: Secondary | ICD-10-CM | POA: Diagnosis not present

## 2022-02-21 DIAGNOSIS — R1310 Dysphagia, unspecified: Secondary | ICD-10-CM

## 2022-02-21 LAB — GLUCOSE, CAPILLARY
Glucose-Capillary: 124 mg/dL — ABNORMAL HIGH (ref 70–99)
Glucose-Capillary: 179 mg/dL — ABNORMAL HIGH (ref 70–99)
Glucose-Capillary: 170 mg/dL — ABNORMAL HIGH (ref 70–99)
Glucose-Capillary: 182 mg/dL — ABNORMAL HIGH (ref 70–99)
Glucose-Capillary: 131 mg/dL — ABNORMAL HIGH (ref 70–99)
Glucose-Capillary: 156 mg/dL — ABNORMAL HIGH (ref 70–99)

## 2022-02-21 LAB — COMPREHENSIVE METABOLIC PANEL WITH GFR
ALT: 33 U/L (ref 0–44)
AST: 43 U/L — ABNORMAL HIGH (ref 15–41)
Albumin: 3.4 g/dL — ABNORMAL LOW (ref 3.5–5.0)
Alkaline Phosphatase: 147 U/L — ABNORMAL HIGH (ref 38–126)
Anion gap: 5 (ref 5–15)
BUN: 35 mg/dL — ABNORMAL HIGH (ref 8–23)
CO2: 25 mmol/L (ref 22–32)
Calcium: 9.4 mg/dL (ref 8.9–10.3)
Chloride: 112 mmol/L — ABNORMAL HIGH (ref 98–111)
Creatinine, Ser: 1.35 mg/dL — ABNORMAL HIGH (ref 0.61–1.24)
GFR, Estimated: 55 mL/min — ABNORMAL LOW
Glucose, Bld: 121 mg/dL — ABNORMAL HIGH (ref 70–99)
Potassium: 5.2 mmol/L — ABNORMAL HIGH (ref 3.5–5.1)
Sodium: 142 mmol/L (ref 135–145)
Total Bilirubin: 1.7 mg/dL — ABNORMAL HIGH (ref 0.3–1.2)
Total Protein: 6.7 g/dL (ref 6.5–8.1)

## 2022-02-21 LAB — CBC WITH DIFFERENTIAL/PLATELET
Abs Immature Granulocytes: 0.11 10*3/uL — ABNORMAL HIGH (ref 0.00–0.07)
Basophils Absolute: 0.1 10*3/uL (ref 0.0–0.1)
Basophils Relative: 1 %
Eosinophils Absolute: 0.4 10*3/uL (ref 0.0–0.5)
Eosinophils Relative: 5 %
HCT: 33.4 % — ABNORMAL LOW (ref 39.0–52.0)
Hemoglobin: 10.6 g/dL — ABNORMAL LOW (ref 13.0–17.0)
Immature Granulocytes: 1 %
Lymphocytes Relative: 28 %
Lymphs Abs: 2.2 10*3/uL (ref 0.7–4.0)
MCH: 29.8 pg (ref 26.0–34.0)
MCHC: 31.7 g/dL (ref 30.0–36.0)
MCV: 93.8 fL (ref 80.0–100.0)
Monocytes Absolute: 0.6 10*3/uL (ref 0.1–1.0)
Monocytes Relative: 8 %
Neutro Abs: 4.3 10*3/uL (ref 1.7–7.7)
Neutrophils Relative %: 57 %
Platelets: 218 10*3/uL (ref 150–400)
RBC: 3.56 MIL/uL — ABNORMAL LOW (ref 4.22–5.81)
RDW: 26 % — ABNORMAL HIGH (ref 11.5–15.5)
Smear Review: ADEQUATE
WBC: 7.7 10*3/uL (ref 4.0–10.5)
nRBC: 0.7 % — ABNORMAL HIGH (ref 0.0–0.2)

## 2022-02-21 LAB — PHOSPHORUS: Phosphorus: 3.9 mg/dL (ref 2.5–4.6)

## 2022-02-21 LAB — MAGNESIUM: Magnesium: 1.9 mg/dL (ref 1.7–2.4)

## 2022-02-21 MED ORDER — QUETIAPINE FUMARATE 50 MG PO TABS
50.0000 mg | ORAL_TABLET | Freq: Every evening | ORAL | Status: DC | PRN
  Administered 2022-02-22 – 2022-02-23 (×2): 50 mg
  Filled 2022-02-21 (×2): qty 1

## 2022-02-21 MED ORDER — SODIUM CHLORIDE 0.9 % IV SOLN: INTRAVENOUS | Status: AC

## 2022-02-21 NOTE — Assessment & Plan Note (Signed)
H/H stable ?Hx of MDS. ?Outpatient f/u with hematology ?Transfusion threshhold Hgb < 7.  ?

## 2022-02-21 NOTE — Progress Notes (Signed)
Inpatient Rehab Admissions Coordinator:  ? ?Awaiting determination from [facility] Medicare regarding expedited appeal begun yesterday.  Will continue to follow.   ? ?Shann Medal, PT, DPT ?Admissions Coordinator ?[phone] ?02/21/22  ?11:14 AM ? ?

## 2022-02-21 NOTE — Assessment & Plan Note (Addendum)
Resolved

## 2022-02-21 NOTE — Progress Notes (Signed)
Occupational Therapy Treatment ?Patient Details ?Name: Vernon [name] ?MRN: [id number] ?DOB: 1946-09-29 ?Today's Date: 02/21/2022 ? ? ?History of present illness Pt. is a 76 y.o. male presenting to [facility] on 3/18 with worseing L side coordination along with N/V. CTH demonstrated a 2,2 cm L cerebellar peduncle ICH. He had an epsidoe fo emisis and developed hypoxemia, requiring intubation as well as undergoing an emergent craniotomy on 3/18; hematoma evacuation and drain placement. PMH significant for anemia, arthritis, GERD, DM2, GERD, HTN, afibb on Eliquis. ?  ?OT comments ? Patient more lethargic on this date requiring max verbal and tactile cues to increase arousal with patient opening eyes briefly.  Patient was unable to wash face or lift LUE on command. Attempted to stand from EOB with PT with no initiation from patient.  Patient has good potential with therapy but dependent on increased level of arousal. Acute OT to continue to follow.   ? ?Recommendations for follow up therapy are one component of a multi-disciplinary discharge planning process, led by the attending physician.  Recommendations may be updated based on patient status, additional functional criteria and insurance authorization. ?   ?Follow Up Recommendations ? Acute inpatient rehab (3hours/day)  ?  ?Assistance Recommended at Discharge Frequent or constant Supervision/Assistance  ?Patient can return home with the following ? Two people to help with walking and/or transfers;Two people to help with bathing/dressing/bathroom;Assistance with cooking/housework;Direct supervision/assist for medications management;Direct supervision/assist for financial management;Assist for transportation;Help with stairs or ramp for entrance ?  ?Equipment Recommendations ? Other (comment) (TBD)  ?  ?Recommendations for Other Services   ? ?  ?Precautions / Restrictions Precautions ?Precautions: Fall ?Precaution Comments: [name], remains lethargic ?Restrictions ?Weight Bearing  Restrictions: No  ? ? ?  ? ?Mobility Bed Mobility ?  ?  ?  ?  ?  ?  ?  ?  ?  ? ?Transfers ?  ?  ?  ?  ?  ?  ?  ?  ?  ?  ?  ?  ?Balance   ?  ?  ?  ?  ?  ?  ?  ?  ?  ?  ?  ?  ?  ?  ?  ?  ?  ?  ?   ? ?ADL either performed or assessed with clinical judgement  ? ?ADL Overall ADL's : Needs assistance/impaired ?  ?  ?Grooming: Wash/dry hands;Maximal assistance;Sitting ?Grooming Details (indicate cue type and reason): hand over hand sitting on EOB. ?  ?  ?  ?  ?  ?  ?  ?  ?  ?  ?  ?  ?  ?  ?  ?General ADL Comments: Patient with decreased level of arousal and requiring more assistance with grooming tasks ?  ? ?Extremity/Trunk Assessment Upper Extremity Assessment ?RUE Deficits / Details: 2-5 grossly to RUE.  Weak grasp, unable to perform hand to mouth, minimal wrist flexion and forearm supination.  Prior RCR. ?RUE Sensation: WNL ?RUE Coordination: decreased fine motor;decreased gross motor ?LUE Deficits / Details: Patient with trace shoulder and bicep, otherwise flaccid. ?LUE Sensation: WNL ?LUE Coordination: decreased fine motor;decreased gross motor ?  ?  ?  ?  ?  ? ?Vision   ?Alignment/Gaze Preference: Gaze right ?Tracking/Visual Pursuits: Unable to hold eye position out of midline;Requires cues, head turns, or add eye shifts to track;Decreased smoothness of horizontal tracking ?Visual Fields: Left visual field deficit ?  ?Perception   ?  ?Praxis   ?  ? ?Cognition   ?  ?  ?  ?  ?  ?  ?  ?  ?  ?  ?  ?  ?  ?  ?  ?  ?  ?  ?  ?  ?  ?   ?  Exercises Exercises: General Upper Extremity ?General Exercises - Upper Extremity ?Elbow Flexion: PROM, Both ?Elbow Extension: PROM, Both ? ?  ?Shoulder Instructions   ? ? ?  ?General Comments VSS  ? ? ?Pertinent Vitals/ Pain       Pain Assessment ?Pain Intervention(s): Monitored during session ? ?Home Living   ?  ?  ?  ?  ?  ?  ?  ?  ?  ?  ?  ?  ?  ?  ?  ?  ?  ?  ? ?  ?Prior Functioning/Environment    ?  ?  ?  ?   ? ?Frequency ? Min 2X/week  ? ? ? ? ?  ?Progress Toward Goals ? ?OT  Goals(current goals can now be found in the care plan section) ? Progress towards OT goals: Not progressing toward goals - comment (lethargic) ? ?Acute Rehab OT Goals ?OT Goal Formulation: Patient unable to participate in goal setting ?Time For Goal Achievement: 02/28/22 ?Potential to Achieve Goals: Fair ?ADL Goals ?Pt Will Perform Grooming: sitting;with mod assist ?Pt Will Transfer to Toilet: with +2 assist;with mod assist ?Additional ADL Goal #1: Pt will complete bed mobility with mod A as a precursor to ADLs ?Additional ADL Goal #2: Pt will maintian midline posture while sitting EOB with min A for at least 5 minutes to perform ADLs  ?Plan Discharge plan remains appropriate   ? ?Co-evaluation ? ? ? PT/OT/SLP Co-Evaluation/Treatment: Yes ?Reason for Co-Treatment: Necessary to address cognition/behavior during functional activity ?PT goals addressed during session: Mobility/safety with mobility ?OT goals addressed during session: Strengthening/ROM ?  ? ?  ?AM-PAC OT "6 Clicks" Daily Activity     ?Outcome Measure ? ? Help from another person eating meals?: Total ?Help from another person taking care of personal grooming?: A Lot ?Help from another person toileting, which includes using toliet, bedpan, or urinal?: Total ?Help from another person bathing (including washing, rinsing, drying)?: A Lot ?Help from another person to put on and taking off regular upper body clothing?: A Lot ?Help from another person to put on and taking off regular lower body clothing?: Total ?6 Click Score: 9 ? ?  ?End of Session Equipment Utilized During Treatment: Gait belt;Oxygen ? ?OT Visit Diagnosis: Repeated falls (R29.6);Other abnormalities of gait and mobility (R26.89);Unsteadiness on feet (R26.81);Muscle weakness (generalized) (M62.81);Pain;Other (comment) ?  ?Activity Tolerance Patient tolerated treatment well ?  ?Patient Left in bed;with call bell/phone within reach;with bed alarm set;with family/visitor present ?  ?Nurse  Communication Mobility status;Other (comment) (level of arousal) ?  ? ?   ? ?Time: 9678-9381 ?OT Time Calculation (min): 25 min ? ?Charges: OT General Charges ?$OT Visit: 1 Visit ?OT Treatments ?$Self Care/Home Management : 8-22 mins ? ?Lodema Hong, OTA ?Acute Rehabilitation Services  ?Pager [phone] ?Office [phone] ? ? ?[place] ?02/21/2022, 12:02 PM ?

## 2022-02-21 NOTE — Progress Notes (Signed)
Speech Language Pathology Treatment: Dysphagia  ?Patient Details ?Name: Vernon [name] ?MRN: [id number] ?DOB: 08-Sep-1946 ?Today's Date: 02/21/2022 ?Time: 5183-3582 ?SLP Time Calculation (min) (ACUTE ONLY): 11 min ? ?Assessment / Plan / Recommendation ?Clinical Impression ? Pt was seen for dysphagia treatment with his RN present. Pt was notably more lethargic than when he was last seen by this SLP on 3/27. Pt's RN stated that the pt has been more lethargic since 3/28. Verbal output was limited and frequent moaning noted. Oral care provided and some white coating noted on superior lingual surface. RN present and aware. Pt's presentation today with boluses appeared more reminiscent of that noted last week. Anterior spillage was noted with nectar thick liquids and he did not swallow any boluses despite cues. It is recommended that p.o. intake be deferred if pt's level of alertness remains at this level. SLP will continue to follow pt.   ?  ?HPI HPI: Pt is a 76 y.o. male who presented to the ED on 3/18 with worseing L side coordination and N/V. 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 Surrounding edema with mild effacement of the fourth ventricle. Pt had an epsidoe fo emesis and developed hypoxemia, requiring intubation for airway protection. ETT 3/18-3/22. Pt s/p emergent craniotomy on 3/18, hematoma evacuation and drain placement. [name] placed 3/20. PMH: anemia, arthritis, GERD, DM2, GERD, HTN, afibb on Eliquis. ?  ?   ?SLP Plan ? Continue with current plan of care ? ?  ?  ?Recommendations for follow up therapy are one component of a multi-disciplinary discharge planning process, led by the attending physician.  Recommendations may be updated based on patient status, additional functional criteria and insurance authorization. ?  ? ?Recommendations  ?Diet recommendations: Dysphagia 2 (fine chop);Nectar-thick  liquid (hold trays if pt remains lethargic) ?Liquids provided via: Cup;Straw ?Medication Administration: Crushed with puree ?Supervision: Full supervision/cueing for compensatory strategies;Trained caregiver to feed patient ?Compensations: Slow rate;Small sips/bites;Follow solids with liquid ?Postural Changes and/or Swallow Maneuvers: Seated upright 90 degrees  ?   ?    ?   ? ? ? ? Oral Care Recommendations: Oral care BID ?Follow Up Recommendations: Acute inpatient rehab (3hours/day) ?Assistance recommended at discharge: Frequent or constant Supervision/Assistance ?SLP Visit Diagnosis: Dysphagia, pharyngeal phase (R13.13) ?Plan: Continue with current plan of care ? ? ? ? ?  ?  ?Osa Fogarty I. Hardin Negus, [place], CCC-SLP ?Acute Rehabilitation Services ?Office number [phone] ?Pager [phone] ? ? ?Vernon [name] ? ?02/21/2022, 8:58 AM ? ? ?

## 2022-02-21 NOTE — Progress Notes (Signed)
Physical Therapy Treatment ?Patient Details ?Name: Vernon [name] ?MRN: [id number] ?DOB: 1946/07/24 ?Today's Date: 02/21/2022 ? ? ?History of Present Illness Pt. is a 76 y.o. male presenting to [facility] on 3/18 with worseing L side coordination along with N/V. CTH demonstrated a 2,2 cm L cerebellar peduncle ICH. He had an epsidoe fo emisis and developed hypoxemia, requiring intubation as well as undergoing an emergent craniotomy on 3/18; hematoma evacuation and drain placement. PMH significant for anemia, arthritis, GERD, DM2, GERD, HTN, afibb on Eliquis. ? ?  ?PT Comments  ? ? Pt remains very lethargic despite max verbal and tactile cues from, PT, OT and spouse. Pt would open eyes briefly to answer question ( appropriately majority of time) and then close them. Pt remains unable to use bilat UEs functionally and doesn't initiation transfers or ADLs. Attempted standing with OT however pt with no active participation and was total assistx2. RN report's pts serequel to have been held last night due to lethargy. Unsure why pt remains to lethargic. Acute PT to cont to follow. ?  ?Recommendations for follow up therapy are one component of a multi-disciplinary discharge planning process, led by the attending physician.  Recommendations may be updated based on patient status, additional functional criteria and insurance authorization. ? ?Follow Up Recommendations ? Acute inpatient rehab (3hours/day) ?  ?  ?Assistance Recommended at Discharge Intermittent Supervision/Assistance  ?Patient can return home with the following Two people to help with walking and/or transfers;Two people to help with bathing/dressing/bathroom;Assistance with cooking/housework;Assistance with feeding;Direct supervision/assist for financial management;Direct supervision/assist for medications management;Assist for transportation;Help with stairs or ramp for entrance ?  ?Equipment Recommendations ? Wheelchair (measurements PT);Wheelchair cushion (measurements  PT);Hospital bed;Other (comment)  ?  ?Recommendations for Other Services Rehab consult ? ? ?  ?Precautions / Restrictions Precautions ?Precautions: Fall ?Precaution Comments: [name], remains lethargic ?Restrictions ?Weight Bearing Restrictions: No  ?  ? ?Mobility ? Bed Mobility ?Overal bed mobility: Needs Assistance ?Bed Mobility: Supine to Sit, Sit to Supine ?  ?  ?Supine to sit: Total assist, +2 for physical assistance ?Sit to supine: Total assist, +2 for physical assistance ?  ?General bed mobility comments: to command pt attempted to move R LE however minimally. Pt dependent via bed pads for trunk elevation and to bring hips to EOB, pt with no functional use of bilat hands ?  ? ?Transfers ?Overall transfer level: Needs assistance ?Equipment used: 2 person hand held assist, Rolling walker (2 wheels) ?Transfers: Sit to/from Stand ?Sit to Stand: +2 physical assistance, Total assist ?  ?  ?  ?  ?  ?General transfer comment: pt to lethargic to safely attempt OOB transfer to chair today, PT and OT used bed pad under pt to aide in bringing pt to stand, bilat knees blocked, pt with no physical assist, unable to hold trunk/head upright, no functional use of bilat UEs ?  ? ?Ambulation/Gait ?  ?  ?  ?  ?  ?  ?  ?General Gait Details: unable this date ? ? ?Stairs ?  ?  ?  ?  ?  ? ? ?Wheelchair Mobility ?  ? ?Modified Rankin (Stroke Patients Only) ?Modified Rankin (Stroke Patients Only) ?Pre-Morbid Rankin Score: No symptoms ?Modified Rankin: Severe disability ? ? ?  ?Balance Overall balance assessment: Needs assistance ?Sitting-balance support: Single extremity supported, Feet supported ?Sitting balance-Leahy Scale: Poor ?Sitting balance - Comments: pt unable to maintain EOB balance, strong R lateral lean requiring max verbal and tactile cues to try to maintain midline, however  pt unable, requiring maxA ?Postural control: Posterior lean, Left lateral lean ?Standing balance support: Single extremity supported, During  functional activity ?Standing balance-Leahy Scale: Poor ?Standing balance comment: dependent on external support ?  ?  ?  ?  ?  ?  ?  ?  ?  ?  ?  ?  ? ?  ?Cognition Arousal/Alertness: Lethargic ?Behavior During Therapy: Flat affect ?Overall Cognitive Status: Impaired/Different from baseline ?Area of Impairment: Memory, Awareness, Problem solving, Following commands, Safety/judgement ?  ?  ?  ?  ?  ?  ?  ?  ?Orientation Level: Disoriented to, Time ?  ?Memory: Decreased short-term memory ?Following Commands: Follows one step commands with increased time, Follows one step commands inconsistently ?Safety/Judgement: Decreased awareness of safety, Decreased awareness of deficits (L sided inattention) ?Awareness: Intellectual ?Problem Solving: Slow processing, Difficulty sequencing, Requires verbal cues, Requires tactile cues ?General Comments: pt very lethargic today requiring max verbal and tactile cues to try to keep pt awake, pt minimally verbal, no initiation of task today, wife present and unable to arouse pt either, pt did answer a couple of questions appropriately but ultimately kept eyes closed ?  ?  ? ?  ?Exercises   ? ?  ?General Comments General comments (skin integrity, edema, etc.): VSS ?  ?  ? ?Pertinent Vitals/Pain Pain Assessment ?Pain Assessment: No/denies pain  ? ? ?Home Living   ?  ?  ?  ?  ?  ?  ?  ?  ?  ?   ?  ?Prior Function    ?  ?  ?   ? ?PT Goals (current goals can now be found in the care plan section) Acute Rehab PT Goals ?PT Goal Formulation: Patient unable to participate in goal setting ?Time For Goal Achievement: 02/28/22 ?Potential to Achieve Goals: Fair ?Progress towards PT goals: Not progressing toward goals - comment (due to lethargy) ? ?  ?Frequency ? ? ? Min 4X/week ? ? ? ?  ?PT Plan Current plan remains appropriate  ? ? ?Co-evaluation PT/OT/SLP Co-Evaluation/Treatment: Yes ?Reason for Co-Treatment: Necessary to address cognition/behavior during functional activity ?PT goals addressed  during session: Mobility/safety with mobility ?  ?  ? ?  ?AM-PAC PT "6 Clicks" Mobility   ?Outcome Measure ? Help needed turning from your back to your side while in a flat bed without using bedrails?: Total ?Help needed moving from lying on your back to sitting on the side of a flat bed without using bedrails?: Total ?Help needed moving to and from a bed to a chair (including a wheelchair)?: Total ?Help needed standing up from a chair using your arms (e.g., wheelchair or bedside chair)?: Total ?Help needed to walk in hospital room?: Total ?Help needed climbing 3-5 steps with a railing? : Total ?6 Click Score: 6 ? ?  ?End of Session Equipment Utilized During Treatment: Oxygen ?Activity Tolerance: Patient limited by lethargy ?Patient left: in bed;with call bell/phone within reach;with bed alarm set;with family/visitor present ?Nurse Communication: Mobility status (RN reports pt's serquel was held) ?PT Visit Diagnosis: Muscle weakness (generalized) (M62.81);Difficulty in walking, not elsewhere classified (R26.2) ?  ? ? ?Time: 6578-4696 ?PT Time Calculation (min) (ACUTE ONLY): 25 min ? ?Charges:  $Neuromuscular Re-education: 8-22 mins          ?          ? ?Kittie Plater, PT, DPT ?Acute Rehabilitation Services ?Pager #: [phone] ?Office #: [phone] ? ? ? ?Hanya Guerin M Hershell Brandl ?02/21/2022, 11:29 AM ? ?

## 2022-02-21 NOTE — Progress Notes (Signed)
STROKE TEAM PROGRESS NOTE  ? ?INTERVAL HISTORY ?Patient has not shown any significant improvement and continues to have drowsiness as well as persistent left upper extremity weakness but is able to extend his elbow on left today.  He has not spiked any fever.  Vital signs are stable.  Blood pressure adequately controlled.  Stat repeat follow-up CT scan y`day shows no acute abnormality with decrease size and conspicuity of left superior cerebellar hemorrhage with associated edema with decreased mass effect on the fourth ventricle.  .  CMP and CBC unremarkable.  . ? ? ?Vitals:  ? 02/20/22 2329 02/21/22 3614 02/21/22 0748 02/21/22 1119  ?BP: (!) 157/68 (!) 152/72 (!) 136/102 (!) 134/99  ?Pulse: (!) 47 60 (!) 54 62  ?Resp: '17 17 20 20  '$ ?Temp: 98 ?F (36.7 ?C) 97.9 ?F (36.6 ?C) 98 ?F (36.7 ?C) 98.4 ?F (36.9 ?C)  ?TempSrc:   Oral Oral  ?SpO2: 93% 92% 98% 96%  ?Weight:      ?Height:      ? ?CBC:  ?Recent Labs  ?Lab 02/20/22 ?4315 02/21/22 ?0117  ?WBC 6.8 7.7  ?NEUTROABS 4.2 4.3  ?HGB 10.0* 10.6*  ?HCT 31.2* 33.4*  ?MCV 94.3 93.8  ?PLT 162 218  ? ?Basic Metabolic Panel:  ?Recent Labs  ?Lab 02/20/22 ?4008 02/21/22 ?0117  ?NA 146* 142  ?K 4.9 5.2*  ?CL 113* 112*  ?CO2 25 25  ?GLUCOSE 144* 121*  ?BUN 35* 35*  ?CREATININE 1.36* 1.35*  ?CALCIUM 9.5 9.4  ?MG 1.7 1.9  ?PHOS 3.8 3.9  ? ?Lipid Panel:  ?No results for input(s): CHOL, TRIG, HDL, CHOLHDL, VLDL, LDLCALC in the last 168 hours. ? ?HgbA1c:  ?No results for input(s): HGBA1C in the last 168 hours. ? ?Urine Drug Screen: No results for input(s): LABOPIA, COCAINSCRNUR, LABBENZ, AMPHETMU, THCU, LABBARB in the last 168 hours.  ?Alcohol Level No results for input(s): ETH in the last 168 hours. ? ?IMAGING past 24 hours ?No results found. ? ?PHYSICAL EXAM ?General: Obese elderly Caucasian male patient, ill-appearing ?Respiratory: regular, unlabored respirations on room air ? ?NEURO:  ?Mental Status: Drowsy but can be aroused and follows commands. ?Speech/Language: speech is without  dysarthria or aphasia.  Repetition, fluency, and comprehension intact. ?Mild left-sided inattention ?Cranial Nerves:  ?II: PERRL. Left inferior quadrant visual field cut ?III, IV, VI: Right gaze deviation, able  to cross midline briefly.  ?V: Sensation is intact to light touch and symmetrical to face.  ?VII: Smile is symmetrical.   ?VIII: hearing intact to voice. ?IX, X: Phonation is normal.  ?XII: tongue is midline without fasciculations. ?Motor: 5/5 strength to RUE and BLE, LUE 1-2/5 strength.  Significant weakness of left grip and fingers.  Tone: is normal and bulk is normal ?Sensation- Intact to light touch bilaterally.   ?Gait- deferred ? ? ?ASSESSMENT/PLAN ?[name] is a 76 y.o. male with history of anemia, arthritis, GERD, DM2, HTN and a-fib on Eliquis presenting with vomiting and incoordiantion on the left side.  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  His Eliquis was reversed with [name] and he was transferred here.  His neurological status began to worsen and he was intubated.  Repeat CT demonstrated enlarging ICH.  Neurosurgery was consulted and decompressive suboccipital craniectomy was performed on 3/19 by Dr Ellene Route.  3% HTS was started for cerebral edema. Patient has been extubated since 3/23 and is doing well. Repeat head CT stable, off precedex. Patient has been transferred out of the ICU and  is being evaluated for admission to CIR. ? ?ICH:  left cerebellar ICH s/p suboccipital decompression and hematoma evacuation likely due to Eliquis use and hypertension ?Code Stroke CT head acute 2.2 cm IPH in middle cerebellar peduncle with surrounding edema ?CTA head & neck no LVO or hemodynamically significant stenosis, no evidence of AVM of aneurysm, moderate right vertebral artery stenosis ?Follow up CT 3/18 increased size of left cerebellar IPH with effaced fourth ventricle ?Follow up CT 3/19 s/p left suboccipital craniectomy and cerebellar hematoma evacuation with moderate volume of residual  posterior fossa blood ?MRI - 02/12/22 - Left suboccipital craniectomy for hematoma evacuation. Roughly 13 mL residual left cerebellar blood tracking across midline to the right. Cerebellar edema with partial effacement of the 4th ventricle. But basilar cistern patency is adequate, with no ventriculomegaly. Trace intraventricular and subarachnoid hemorrhage.  ?CT 3/20 and 3/22 stable mass effect and no hydrocephalus ?2D Echo EF 55-60% ?LDL 25 ?HgbA1c 6.2 ?VTE prophylaxis - heparin subcu ?Eliquis (apixaban) daily prior to admission, now on No antithrombotic secondary to IPH  may consider resuming AC at 4 weeks ?Therapy recommendations:  CIR ?Disposition:  pending ? ?Obstructive hydrocephalus  ?S/p suboccipital decompression and hematoma evacuation ?CT 3/18 demonstrated effacement of fourth ventricle with developing obstructive hydrocephalus ?Suboccipital craniectomy performed by neurosurgery ?Parieto-occipital hemovac drain placed ?CT 3/20 and 3/22 and MRI 3/20 no hydrocephalus ?On keppra post op  for total 14 days (ends 02/24/22) ? ?Cerebral Edema ?Edema seen surrounding left cerebellar ICH on CT scan with risk for hydrocephalus ?3% HTS at 75 mL/hr->40cc->off->FW 200Q4 ?23.5% bolus given x 1 ?Na  145->149->151->157->156->158->156->161->162->158-> 157-> 154-> 147 ?Allow Na trending down gradually ? ?Hypertension ?Hypotensive ?Home meds:  lisinopril 40 mg daily ?stable ?Currently off norepinephrine ?Long-term BP goal normotensive ? ?Hyperlipidemia ?Home meds:  simvastatin 80 mg daily ?LDL 25, goal < 70 ?Hold off statin due to low LDL ? ?Diabetes type II Controlled ?Home meds:  metformin 500 mg BID ?HgbA1c 6.2, goal < 7.0 ?CBGs  ?SSI ? ?Respiratory failure ?Pleural effusion ?Patient intubated due to inability to protect airway after aspiration ?CCM on board ?Ventilator management per CCM ?S/p right thoracentesis 3/21 ?Extubated 3/23 ? ?Afib  ?On eliquis PTA ?Now bradycardia  ?No antithrombotics for now given  ICH ? ?AKI ?Cre 1.24-1.62-1.73->1.86->1.83->2.03->1.99->1.77->1.66-> 1.61-> 1.47-> 1.34 ?Hyperkalemia K 6.1-5.7-5.6 s/p Lokelma ->5.4 s/p Lokelma->4.5->4.3-> 4.1-> resolved ?BMP monitoring ?CCM on board ? ?MDS ?Followed with oncology, receiving infusions ?Hx of refractory anemia ?Hb 8.9->9.5->8.7->7.9->6.9 PRBC->7.9-> 7.8-> 8.4-> 9.2 ?WBC 11.0->9.3->4.0->5.0-> stable ?Platelet 190->207->220->123->130-> 138-> 147-> 156 ?CBC monitoring ? ?Other Stroke Risk Factors ?Advanced Age >/= 41  ?Former Cigarette smoker ?Obesity, Body mass index is 29.92 kg/m?., BMI >/= 30 associated with increased stroke risk, recommend weight loss, diet and exercise as appropriate  ? ?Other Active Problems ?Hyperkalemia, K 6.1-5.7-5.6 s/p Lokelma ->5.4 s/p Lokelma->4.5 ?Hypomagnesemia - Magnesium - 1.6->2.5 ->2.3 ? ? ?Hospital day # 11 ? ?  Patient's unfortunately has shown neurological worsening with increased left upper extremity weakness but no reversible etiology has been found on work up. Patient remains at recurrent risk for embolic strokes from A-fib since he is off anticoagulation.  Unfortunately it is not safe to start on anticoagulation so soon may need to make decision about resuming anticoagulation or alternative treatment like Watchman device in about a month from now when patient has recovered from the stroke.  Discussed with patient and Dr. Grandville Silos.  Greater than 50% time during this 35-minute visit was spent in counseling and coordination of care and  discussion with patient and care team and answering questions as well as his embolic stroke and intracerebral hemorrhage and risk benefit of resuming anticoagulation versus Watchman device. ? ?Antony Contras, MD ?Medical Director ?[facility] ?Pager: [phone] ?02/21/2022 1:31 PM ? ?To contact Stroke Continuity provider, please refer to [email or web address] ?After hours, contact General Neurology  ?

## 2022-02-21 NOTE — Progress Notes (Signed)
?PROGRESS NOTE ? ? ? ?Vernon [name]  YWV:[id number] DOB: 1946-05-10 DOA: 02/10/2022 ?PCP: Baxter Hire, MD  ? ? ?No chief complaint on file. ? ? ?Brief Narrative:  ?76 yo with hx atrial fibrillation on eliquis, T2DM, anemia, GERD, aortic stenosis, MDS and multiple other medical problems presented with weakness and nausea, found to have a cerebellar hemorrhage.  He required intubation for airway protection and underwent emergent craniotomy on 3/18 with hematoma evacuation and drain placement.   ? ?Significant Events ?3/18 admission, intubation for airway protection after vomiting, underwent Suboccipital craniectomy and evacuation of cerebellar hemorrhage.  Placement of right parieto-occipital ventriculostomy by Dr. Ellene Route ?3/19 repeat CT head > s/p left suboccipital craniectomy and cerebellar hematoma evacuation with moderate residual volume of posterior fossa blood, improved patency of 4th ventricle, decreased lateral and 3rd ventricle size; EVD in place, otherwise stable CT ?3/20  MRI Brain>>Left suboccipital craniectomy for hematoma evacuation. Roughly 13 mL residual left cerebellar blood tracking across midline to the right. Cerebellar edema with partial effacement of the 4th ventricle. But basilar cistern patency is adequate, with no ventriculomegaly. Sequelae of attempted EVD placement across the splenium of the corpus callosum. Trace intraventricular and subarachnoid hemorrhage. Background mild to moderate nonspecific cerebral white matter signal changes. 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 ?3/21: patient on PSV; thoracentesis for R pleural effusion; off pressors; sedation switched to cleviprex; hypertonic saline stopped due to hypernatremia; started on unasyn, trach asp sent ?3/22: pulled EVD out; NSG aware and will just continue to monitor; extubated ?3/23: remains on precedex; off levo, transfused 1 unit PRBC, repeat CTH stable ?3/27 TRH now  primary ?Worsening mental status and weakness today, head CT pending ?  ? ? ?Assessment & Plan: ? Principal Problem: ?  ICH (intracerebral hemorrhage) ([place]) ?Active Problems: ?  Acute metabolic encephalopathy ?  Cerebral edema (HCC) ?  Obstructive hydrocephalus ([place]) ?  Dysphagia ?  Pleural effusion on right ?  Respiratory failure requiring intubation ([place]) ?  Cough ?  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[place]) ?  Chronic atrial fibrillation (HCC) ?  AKI (acute kidney injury) ([place]) ?  Essential hypertension ?  Type 2 diabetes mellitus with hyperglycemia (HCC) ?  Dyslipidemia ?  Hyperkalemia ?  Anemia ? ? ? ?Assessment and Plan: ?* ICH (intracerebral hemorrhage) ([place]) ?Presented to [facility] 3/18 with weakness and nausea, found to have cerebellar hemorrhage 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 and HTN ?Head CT 3/18 with acute 2.2 cm intraparencymal hemorrhage in the L middle cerebellar peduncle with probable small volume adjacent extra axial extension, surrounding edema with mild effacement of 4th ventricle, no hydrocephalus ?CTA head/neck 3/18 without LVO, no evidence of vascular malformation or aneurysm in region of acute hemorrhage -> consider f/u after resolution of hemorrhage -- nondominant right verterbral artery origin stenosis, mild atherosclerosis ?CT 3/18 with increased size of intraparenchymal hematoma, increased subdural blood ?3/19 head CT s/p L suboccipital craniectomy and cerebellar hematoma evaculation with a moderate residual volume of posterior fossa blood, but improved patency of the 4th ventricle and decreased lateral and 3rd bentricle size, sequelae of temporary R parieto occipital EVD placement ?3/20 MRI brain L suboccipital craniectomy for hematoma evacuation, 13 ml residual L cerebellar blood tracking across midline to R, cerebellar edema with partial effacement of the 4th ventricle - sequelae of attempted EVD placement across splenium of corpus collosum ?3/20 head CT with no  unexpected or new unfavorable finding ?3/23 head CT with no interval  progression of cerebellar edema and hemorrhage ?3/26 head CT with decreased size and conspicuity of L superior cerebellar hemorrhage with associated edema, with slightly decreased mass effect on the 4th ventricle, redemonstrated hemorrahge in the R occipital ventriculostomy tract, slightly decreased density of hemorrhage in parietal ventriculostomy tract  ?Repeat head CT 3/28 with worsening mental status, worsening weakness (LUE flaccid) ?Discussed with neuro who evaluated the patient, and is noted noted unfortunately patient with neurological worsening however no reversible etiology noted on work-up as infectious work-up done negative. ?It is noted by neurology that patient had recurrent risk for embolic strokes from A-fib as he is off anticoagulation however unsafe to start anticoagulation at this time and may need to make a decision about resuming anticoagulation alternative treatment like Watchman device in about a month from now when patient has recovered from his acute stroke. ?-PT/OT/SLP. ?-Being assessed for inpatient rehab admission. ?No antithrombotic 2/2 ICH ?LDL 25, no statin, A1c 6.2 ?Echo EF 55-60%, no RWMA ? ? ?Acute metabolic encephalopathy ?-Secondary to above.  ?Repeat head CT done 02/20/2022 with no evidence of interval acute intracranial abnormality, continued evolution of postop changes from prior left suboccipital craniectomy and left posterior fossa hematoma evacuation.  Residual hemorrhage within the left cerebellar hemisphere similar to slightly decreased from prior head CT of 02/18/2022.  Unchanged left cerebellar edema.  Stable mass effect with partial effacement of fourth ventricle.  No evidence of hydrocephalus. ?-Chest x-ray done unremarkable. ?-None nitrite negative leukocytes negative, 0-5 WBCs. ?-VBG with a pH of 7.42, PCO2 of 43, PO2 of 51. ?-Nephrology following. ?-Gentle hydration for the next 24 hours. ?-Change  seroquel to prn.  ?Afebrile, wbc wnl ?Delirium precautions ? ?Obstructive hydrocephalus ([place]) ?3/19 suboccipital craniectomy and evacuation of cerebellar hemorrhage, placement of R parietooccipital ventriculostomy ?EVD pulled out 3/22 ?keppra post op x14 days (ends 02/24/22) ?NSGY recommending continued supportive care, PT/OT/SLP, dispo planning ? ? ?Cerebral edema (HCC) ?S/p hypertonic saline ?Now off, Na trending down and currently at 142. ? ?Dysphagia ?cortrak in placed. ?-Patient seen by SLP and started on dysphagia 2 diet with nectar thick liquids and to hold trays if patient remains lethargic. ?-Continue tube feeds. ? ?Cough ?Remains on RA, worsening cough today with AMS noted on 02/20/2022. ?-Repeat chest x-ray no acute abnormalities. ? ?Respiratory failure requiring intubation ([place]) ?Now extubated on RA, follow CXR 3/28 - no acute cardiopulm abnormality ?S/p unasyn for concern for aspiration pneumonia. ? ?Pleural effusion on right ?CXR 2/24 with small symmetric effusions. ?S/p thoracentesis, consistent with transudate ?Cytology with no malignant cells ? ?AKI (acute kidney injury) ([place]) ?Improved, follow ? ?Chronic atrial fibrillation (HCC) ?Not on any antithrombotics at this time given ICH ?Will need to discuss going forward, per neuro, may need to make decision regarding resuming anticoagulation or alternative treatment, watchman (after he's recovered in abt a month)? ? ?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[place]) ?Follows with Dr. Rogue Bussing, follow outpatient ? ?Dyslipidemia ?Continue statin. ? ?Type 2 diabetes mellitus with hyperglycemia ([place]) ?Hemoglobin A1c 6.2 (02/11/2022 ). ?CBG 179 this morning. ?Continue sliding scale insulin, NovoLog 2 units every 4 hours, Semglee 30 units daily. ?Outpatient follow-up. ? ?Essential hypertension ?Controlled on current regimen of Coreg.   ?-Labetalol prn. ? ?Anemia ?H/H stable ?Hx of MDS. ?Outpatient f/u with hematology ?Transfusion threshhold Hgb < 7.  ? ?Hyperkalemia ?Repeat  labs in AM. ? ? ? ? ?  ? ? ?DVT prophylaxis: Heparin ?Code Status: Full ?Family Communication: Updated wife at bedside. ?Disposition: Likely CIR ? ?Status is: Inpatient ?Remains inpatient appropriate because:  [place]

## 2022-02-21 NOTE — PMR Pre-admission (Signed)
PMR Admission Coordinator Pre-Admission Assessment ? ?Patient: Vernon [name] is an 76 y.o., male ?MRN: [id number] ?DOB: Jun 13, 1946 ?Height: _0  (175.3 cm) ?Weight: 91.9 kg ? ?Insurance Information ?HMO: yes    PPO:      PCP:      IPA:      80/20:      OTHER:  ?PRIMARY: [employer] Medicare      Policy#: [id number]      Subscriber: pt ?CM Name:       Phone#: [phone]     Fax#: [phone] ?Pre-Cert#: [id number] approval for 7 days from 4/1 with updates due to fax listed above on 4/7      Employer:  ?Benefits:  Phone #: [phone]     Name: n/a ?Eff. Date: 11/26/21     Deduct: $0      Out of Pocket Max: $4500 (met $321.74)      Life Max: n/a ?CIR: $325/day for days 1-5      SNF: 20 full days ?Outpatient:      Co-Pay: $20/visit ?Home Health: 100%      Co-Pay:  ?DME: 80%     Co-ins: 20% ?Providers:  ?SECONDARY:       Policy#:      Phone#:  ? ?Financial Counselor:       Phone#:  ? ?The ?Data Collection Information Summary? for patients in Inpatient Rehabilitation Facilities with attached ?Privacy Act [place] Records? was provided and verbally reviewed with: Patient and Family ? ?Emergency Contact Information ?Contact Information   ? ? Name Relation Home Work Mobile  ? [place]  [phone]  ? ULIS, KAPS Son   [phone]  ? ?  ? ? ?Current Medical History  ?Patient Admitting Diagnosis: L cerebellar ICH s/p crani evacuation ? ?History of Present Illness: Vernon [name] is a 76 year old right-handed male with history of chronic anemia, diabetes mellitus, hyperlipidemia, hypertension, atrial fibrillation maintained on Eliquis.  Per chart review patient lives with spouse.  Able to stay on main level with bed and bath.  Independent driving prior to admission and retired.  Wife works full-time.  Presented 02/10/2022 with left-sided weakness and incoordination as well as nausea vomiting.  CT of the head showed acute 2.2 cm intraparenchymal hemorrhage in the left middle cerebellar peduncle and  probable small volume adjacent extra-axial extension.  Surrounding edema with mild effacement of the fourth ventricle.   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  Mild atherosclerosis without greater than 50% stenosis.  Eliquis was reversed with [name].  Patient underwent suboccipital craniectomy and evacuation of cerebellar hemorrhage.  Placement of right parieto-occipital ventriculostomy 02/11/2022 per Dr. Ellene Route.  Maintained on Keppra 14 days postoperatively ending 02/24/2022.  Follow-up CTs/MRI no hydrocephalus.  EVD was pulled 3/22.  Patient did require short-term intubation through 02/14/2022.  Postoperative course finding the left-sided pleural effusion and moderate to large right side pleural effusion and underwent thoracentesis with 1250 cc yield.  Latest chest x-ray showing no active disease.  Patient currently remains n.p.o. with nutritional tube feeds.  He was cleared to begin subcutaneous heparin for DVT prophylaxis 02/12/2022.  Neurology notes considering resuming AC at 4 weeks.  Bouts of restlessness and agitation with Seroquel added.  Therapy evaluations completed due to patient decreased functional mobility left side weakness was recommended for a comprehensive rehab program. ? ?Complete NIHSS TOTAL: 10 ? ?Patient's medical record from [facility] has been reviewed by the rehabilitation admission coordinator and physician. ? ?Past Medical History  ?  Past Medical History:  ?Diagnosis Date  ? Anemia   ? Aortic stenosis   ? Arthritis   ? Complication of anesthesia   ? hard time getting bp up after knee replacement  ? Diabetes mellitus without complication ([place])   ? GERD (gastroesophageal reflux disease)   ? occ tums prn  ? History of hiatal hernia   ? Hypertension   ? 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[place])   ? ? ?Has the patient had major surgery during 100 days prior to admission? Yes ? ?Family History   ?family history includes Cancer in his maternal  uncle. ? ?Current Medications ? ?Current Facility-Administered Medications:  ?  albuterol (PROVENTIL) (2.5 MG/3ML) 0.083% nebulizer solution 2.5 mg, 2.5 mg, Nebulization, Q4H PRN, Simonne Maffucci B, MD ?  amantadine (SYMMETREL) 50 MG/5ML solution 100 mg, 100 mg, Oral, BID, Shafer, Devon, NP, 100 mg at 02/23/22 0854 ?  bisacodyl (DULCOLAX) suppository 10 mg, 10 mg, Rectal, Daily PRN, Kristeen Miss, MD, 10 mg at 02/14/22 1139 ?  carvedilol (COREG) tablet 3.125 mg, 3.125 mg, Per Tube, BID WC, Jennelle Human B, NP, 3.125 mg at 02/23/22 0855 ?  chlorhexidine gluconate (MEDLINE KIT) (PERIDEX) 0.12 % solution 15 mL, 15 mL, Mouth Rinse, BID, Kristeen Miss, MD, 15 mL at 02/23/22 0856 ?  feeding supplement (OSMOLITE 1.5 CAL) liquid 1,000 mL, 1,000 mL, Per Tube, Continuous, Eugenie Filler, MD, Last Rate: 60 mL/hr at 02/23/22 1132, 1,000 mL at 02/23/22 1132 ?  feeding supplement (PROSource TF) liquid 45 mL, 45 mL, Per Tube, BID, Eugenie Filler, MD, 45 mL at 02/23/22 0856 ?  free water 200 mL, 200 mL, Per Tube, Q4H, Eugenie Filler, MD, 200 mL at 02/23/22 1200 ?  heparin injection 5,000 Units, 5,000 Units, Subcutaneous, Q8H, Rosalin Hawking, MD, 5,000 Units at 02/23/22 1348 ?  insulin aspart (novoLOG) injection 0-15 Units, 0-15 Units, Subcutaneous, Q4H, Simonne Maffucci B, MD, 2 Units at 02/23/22 1200 ?  insulin aspart (novoLOG) injection 2 Units, 2 Units, Subcutaneous, Q4H, Jennelle Human B, NP, 2 Units at 02/23/22 1159 ?  insulin glargine-yfgn (SEMGLEE) injection 30 Units, 30 Units, Subcutaneous, Daily, McQuaid, Douglas B, MD, 30 Units at 02/23/22 1130 ?  labetalol (NORMODYNE) injection 10-40 mg, 10-40 mg, Intravenous, Q10 min PRN, Kristeen Miss, MD, 10 mg at 02/16/22 0534 ?  levETIRAcetam (KEPPRA) 100 MG/ML solution 500 mg, 500 mg, Per Tube, BID, Willette Cluster, RPH, 500 mg at 02/23/22 4081 ?  pantoprazole sodium (PROTONIX) 40 mg/20 mL oral suspension 40 mg, 40 mg, Per Tube, Daily, Jacky Kindle, MD, 40 mg at  02/23/22 0853 ?  polyethylene glycol (MIRALAX / GLYCOLAX) packet 17 g, 17 g, Per Tube, Daily, Jacky Kindle, MD, 17 g at 02/23/22 4481 ?  QUEtiapine (SEROQUEL) tablet 50 mg, 50 mg, Per Tube, QHS PRN, Eugenie Filler, MD, 50 mg at 02/22/22 2125 ?  senna-docusate (Senokot-S) tablet 1 tablet, 1 tablet, Per Tube, BID, Kristeen Miss, MD, 1 tablet at 02/23/22 0854 ? ?Patients Current Diet:  ?Diet Order   ? ?       ?  Diet NPO time specified  Diet effective now       ?  ? ?  ?  ? ?  ? ? ?Precautions / Restrictions ?Precautions ?Precautions: Fall ?Precaution Comments: [name] ?Restrictions ?Weight Bearing Restrictions: No  ? ?Has the patient had 2 or more falls or a fall with injury in the past year? No ? ?Prior Activity Level ?Community (5-7x/wk): drives, retired, [place]  and cares for it ? ?Prior Functional Level ?Self Care: Did the patient need help bathing, dressing, using the toilet or eating? Independent ? ?Indoor Mobility: Did the patient need assistance with walking from room to room (with or without device)? Independent ? ?Stairs: Did the patient need assistance with internal or external stairs (with or without device)? Independent ? ?Functional Cognition: Did the patient need help planning regular tasks such as shopping or remembering to take medications? Independent ? ?Patient Information ?Are you of Hispanic, Latino/a,or Spanish origin?: A. No, not of Hispanic, Latino/a, or Spanish origin ?What is your race?: A. White ?Do you need or want an interpreter to communicate with a doctor or health care staff?: 0. No ? ?Patient's Response To:  ?Health Literacy and Transportation ?Is the patient able to respond to health literacy and transportation needs?: Yes ?Health Literacy - How often do you need to have someone help you when you read instructions, pamphlets, or other written material from your doctor or pharmacy?: Never ?In the past 12 months, has lack of transportation kept you from medical appointments  or from getting medications?: No ?In the past 12 months, has lack of transportation kept you from meetings, work, or from getting things needed for daily living?: No ? ?Home Assistive Devices / Equipment ?Home

## 2022-02-22 LAB — BASIC METABOLIC PANEL
Anion gap: 7 (ref 5–15)
BUN: 37 mg/dL — ABNORMAL HIGH (ref 8–23)
CO2: 25 mmol/L (ref 22–32)
Calcium: 9.3 mg/dL (ref 8.9–10.3)
Chloride: 110 mmol/L (ref 98–111)
Creatinine, Ser: 1.39 mg/dL — ABNORMAL HIGH (ref 0.61–1.24)
GFR, Estimated: 53 mL/min — ABNORMAL LOW (ref >60)
Glucose, Bld: 162 mg/dL — ABNORMAL HIGH (ref 70–99)
Potassium: 5 mmol/L (ref 3.5–5.1)
Sodium: 142 mmol/L (ref 135–145)

## 2022-02-22 LAB — GLUCOSE, CAPILLARY
Glucose-Capillary: 118 mg/dL — ABNORMAL HIGH (ref 70–99)
Glucose-Capillary: 135 mg/dL — ABNORMAL HIGH (ref 70–99)
Glucose-Capillary: 150 mg/dL — ABNORMAL HIGH (ref 70–99)
Glucose-Capillary: 152 mg/dL — ABNORMAL HIGH (ref 70–99)
Glucose-Capillary: 172 mg/dL — ABNORMAL HIGH (ref 70–99)
Glucose-Capillary: 177 mg/dL — ABNORMAL HIGH (ref 70–99)

## 2022-02-22 LAB — CBC
HCT: 31 % — ABNORMAL LOW (ref 39.0–52.0)
Hemoglobin: 9.9 g/dL — ABNORMAL LOW (ref 13.0–17.0)
MCH: 29.5 pg (ref 26.0–34.0)
MCHC: 31.9 g/dL (ref 30.0–36.0)
MCV: 92.3 fL (ref 80.0–100.0)
Platelets: 245 10*3/uL (ref 150–400)
RBC: 3.36 MIL/uL — ABNORMAL LOW (ref 4.22–5.81)
RDW: 26 % — ABNORMAL HIGH (ref 11.5–15.5)
WBC: 9.4 10*3/uL (ref 4.0–10.5)
nRBC: 0.3 % — ABNORMAL HIGH (ref 0.0–0.2)

## 2022-02-22 MED ORDER — FREE WATER
200.0000 mL | Status: DC
  Administered 2022-02-22 – 2022-02-26 (×25): 200 mL

## 2022-02-22 MED ORDER — PROSOURCE TF PO LIQD
45.0000 mL | Freq: Two times a day (BID) | ORAL | Status: DC
  Administered 2022-02-22 – 2022-02-26 (×8): 45 mL
  Filled 2022-02-22 (×8): qty 45

## 2022-02-22 MED ORDER — OSMOLITE 1.5 CAL PO LIQD
1000.0000 mL | ORAL | Status: DC
  Administered 2022-02-22 – 2022-02-25 (×5): 1000 mL
  Filled 2022-02-22 (×6): qty 1000

## 2022-02-22 MED ORDER — AMANTADINE HCL 50 MG/5ML PO SOLN
100.0000 mg | Freq: Two times a day (BID) | ORAL | Status: DC
  Administered 2022-02-22 – 2022-02-24 (×6): 100 mg via ORAL
  Filled 2022-02-22 (×6): qty 10

## 2022-02-22 NOTE — Progress Notes (Signed)
?PROGRESS NOTE ? ? ? ?NEFTALI ABAIR  UJW:[id number] DOB: 03-06-1946 DOA: 02/10/2022 ?PCP: Baxter Hire, MD  ? ? ?No chief complaint on file. ? ? ?Brief Narrative:  ?76 yo with hx atrial fibrillation on eliquis, T2DM, anemia, GERD, aortic stenosis, MDS and multiple other medical problems presented with weakness and nausea, found to have a cerebellar hemorrhage.  He required intubation for airway protection and underwent emergent craniotomy on 3/18 with hematoma evacuation and drain placement.   ? ?Significant Events ?3/18 admission, intubation for airway protection after vomiting, underwent Suboccipital craniectomy and evacuation of cerebellar hemorrhage.  Placement of right parieto-occipital ventriculostomy by Dr. Ellene Route ?3/19 repeat CT head > s/p left suboccipital craniectomy and cerebellar hematoma evacuation with moderate residual volume of posterior fossa blood, improved patency of 4th ventricle, decreased lateral and 3rd ventricle size; EVD in place, otherwise stable CT ?3/20  MRI Brain>>Left suboccipital craniectomy for hematoma evacuation. Roughly 13 mL residual left cerebellar blood tracking across midline to the right. Cerebellar edema with partial effacement of the 4th ventricle. But basilar cistern patency is adequate, with no ventriculomegaly. Sequelae of attempted EVD placement across the splenium of the corpus callosum. Trace intraventricular and subarachnoid hemorrhage. Background mild to moderate nonspecific cerebral white matter signal changes. 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 ?3/21: patient on PSV; thoracentesis for R pleural effusion; off pressors; sedation switched to cleviprex; hypertonic saline stopped due to hypernatremia; started on unasyn, trach asp sent ?3/22: pulled EVD out; NSG aware and will just continue to monitor; extubated ?3/23: remains on precedex; off levo, transfused 1 unit PRBC, repeat CTH stable ?3/27 TRH now  primary ?Worsening mental status and weakness today, head CT pending ?  ? ? ?Assessment & Plan: ? Principal Problem: ?  ICH (intracerebral hemorrhage) ([place]) ?Active Problems: ?  Acute metabolic encephalopathy ?  Cerebral edema (HCC) ?  Obstructive hydrocephalus ([place]) ?  Dysphagia ?  Pleural effusion on right ?  Respiratory failure requiring intubation ([place]) ?  Cough ?  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[place]) ?  Chronic atrial fibrillation (HCC) ?  AKI (acute kidney injury) ([place]) ?  Essential hypertension ?  Type 2 diabetes mellitus with hyperglycemia (HCC) ?  Dyslipidemia ?  Hyperkalemia ?  Anemia ? ? ? ?Assessment and Plan: ?* ICH (intracerebral hemorrhage) ([place]) ?Presented to [facility] 3/18 with weakness and nausea, found to have cerebellar hemorrhage. 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 and HTN ?Head CT 3/18 with acute 2.2 cm intraparencymal hemorrhage in the L middle cerebellar peduncle with probable small volume adjacent extra axial extension, surrounding edema with mild effacement of 4th ventricle, no hydrocephalus ?CTA head/neck 3/18 without LVO, no evidence of vascular malformation or aneurysm in region of acute hemorrhage -> consider f/u after resolution of hemorrhage -- nondominant right verterbral artery origin stenosis, mild atherosclerosis ?CT 3/18 with increased size of intraparenchymal hematoma, increased subdural blood ?3/19 head CT s/p L suboccipital craniectomy and cerebellar hematoma evaculation with a moderate residual volume of posterior fossa blood, but improved patency of the 4th ventricle and decreased lateral and 3rd bentricle size, sequelae of temporary R parieto occipital EVD placement ?3/20 MRI brain L suboccipital craniectomy for hematoma evacuation, 13 ml residual L cerebellar blood tracking across midline to R, cerebellar edema with partial effacement of the 4th ventricle - sequelae of attempted EVD placement across splenium of corpus collosum ?3/20 head CT with no  unexpected or new unfavorable finding ?3/23 head CT with no interval  progression of cerebellar edema and hemorrhage ?3/26 head CT with decreased size and conspicuity of L superior cerebellar hemorrhage with associated edema, with slightly decreased mass effect on the 4th ventricle, redemonstrated hemorrahge in the R occipital ventriculostomy tract, slightly decreased density of hemorrhage in parietal ventriculostomy tract  ?Repeat head CT 3/28 with worsening mental status, worsening weakness (LUE flaccid) ?Discussed with neuro who evaluated the patient, and is noted noted unfortunately patient with neurological worsening however no reversible etiology noted on work-up as infectious work-up done negative. ?It is noted by neurology that patient had recurrent risk for embolic strokes from A-fib as he is off anticoagulation however unsafe to start anticoagulation at this time and may need to make a decision about resuming anticoagulation alternative treatment like Watchman device in about a month from now when patient has recovered from his acute stroke. ?-PT/OT/SLP. ?-Being assessed for inpatient rehab admission. ?No antithrombotic 2/2 ICH ?LDL 25, no statin, A1c 6.2 ?Echo EF 55-60%, no RWMA ?Will need outpatient follow-up with neurology and neurosurgery. ? ? ?Acute metabolic encephalopathy ?-Secondary to above.  ?Repeat head CT done 02/20/2022 with no evidence of interval acute intracranial abnormality, continued evolution of postop changes from prior left suboccipital craniectomy and left posterior fossa hematoma evacuation.  Residual hemorrhage within the left cerebellar hemisphere similar to slightly decreased from prior head CT of 02/18/2022.  Unchanged left cerebellar edema.  Stable mass effect with partial effacement of fourth ventricle.  No evidence of hydrocephalus. ?-Chest x-ray done unremarkable. ?-None nitrite negative leukocytes negative, 0-5 WBCs. ?-VBG with a pH of 7.42, PCO2 of 43, PO2 of 51. ?-Neurology  following. ?-Patient on gentle hydration which will be discontinued after today. ?-Seroquel was changed to as needed however we will discontinue Seroquel for now. ?Afebrile, wbc wnl ?Delirium precautions ? ?Obstructive hydrocephalus ([place]) ?3/19 suboccipital craniectomy and evacuation of cerebellar hemorrhage, placement of R parietooccipital ventriculostomy ?EVD pulled out 3/22 ?keppra post op x14 days (ends 02/24/22) ?NSGY recommending continued supportive care, PT/OT/SLP, dispo planning ? ? ?Cerebral edema (HCC) ?S/p hypertonic saline ?Now off, Na trending down and currently at 142 and seems to be stabilizing. ? ?Dysphagia ?cortrak in placed. ?-Patient seen by SLP and started on dysphagia 2 diet with nectar thick liquids and to hold trays if patient remains lethargic. ?-Continue tube feeds for now due to fluctuating mental status. ? ?Cough ?Remains on RA, worsening cough with AMS noted on 02/20/2022. ?-Repeat chest x-ray no acute abnormalities. ?-Symptomatic treatment. ? ?Respiratory failure requiring intubation ([place]) ?Now extubated on RA, follow CXR 3/28 - no acute cardiopulm abnormality ?S/p unasyn for concern for aspiration pneumonia. ? ?Pleural effusion on right ?CXR 2/24 with small symmetric effusions. ?S/p thoracentesis, consistent with transudate ?Cytology with no malignant cells ? ?AKI (acute kidney injury) ([place]) ?Improved, follow ? ?Chronic atrial fibrillation (HCC) ?Not on any antithrombotics at this time given ICH ?Will need to discuss going forward, per neuro, may need to make decision regarding resuming anticoagulation or alternative treatment, watchman (after he's recovered in abt a month)? ? ?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[place]) ?Follows with Dr. Rogue Bussing, follow outpatient ? ?Dyslipidemia ?Continue statin. ? ?Type 2 diabetes mellitus with hyperglycemia ([place]) ?Hemoglobin A1c 6.2 (02/11/2022 ). ?CBG 152 this morning. ?Continue sliding scale insulin, NovoLog 2 units every 4 hours, Semglee 30 units  daily. ?Outpatient follow-up. ? ?Essential hypertension ?Controlled on current regimen of Coreg.   ?-Labetalol prn. ? ?Anemia ?H/H stable ?Hx of MDS. ?Outpatient f/u with hematology ?Transfusion threshhold Hgb < 7.  ? ?H

## 2022-02-22 NOTE — Plan of Care (Signed)
?  Problem: Health Behavior/Discharge Planning: ?Goal: Ability to manage health-related needs will improve ?Outcome: Progressing ?  ?Problem: Clinical Measurements: ?Goal: Diagnostic test results will improve ?Outcome: Progressing ?  ?Problem: Pain Managment: ?Goal: General experience of comfort will improve ?Outcome: Progressing ?  ?Problem: Safety: ?Goal: Ability to remain free from injury will improve ?Outcome: Progressing ?  ?

## 2022-02-22 NOTE — Progress Notes (Signed)
Inpatient Rehab Admissions Coordinator:  ? ?Met with patient and his wife at bedside.  He is more alert than the last two days, vocalizing, and moving [place] freely.  Wife is concerned regarding expectations for home.  I let her know that I'd like to see how he does today/tomorrow and hope for some consistency with his arousal (per her report, starting stimulant).  My hope is that he will perk up and we can expect supervision to min assist goals, but I did let her know that if he does come to rehab and is unable to progress to that level, insurance would not likely approve SNF admit from CIR.  We discussed what that may look like for d/c home.  I did receive insurance approval for appeal and I will follow up tomorrow to see how he's doing for possible admit.   ? ?Shann Medal, PT, DPT ?Admissions Coordinator ?[phone] ?02/22/22  ?12:32 PM ? ?

## 2022-02-22 NOTE — Progress Notes (Signed)
STROKE TEAM PROGRESS NOTE  ? ?INTERVAL HISTORY ?Patient has not shown any significant improvement and continues to have drowsiness as well as persistent left upper extremity weakness but is able to move his left side better today.  He has not spiked any fever.  Vital signs are stable.  Blood pressure adequately controlled.   Wife is at the bedside. ? ? ?Vitals:  ? 02/21/22 2311 02/22/22 0336 02/22/22 0806 02/22/22 1211  ?BP: (!) 155/76 135/63 120/65 124/79  ?Pulse: 62 (!) 56 (!) 58 (!) 55  ?Resp: '19 19 20 19  '$ ?Temp: 98.7 ?F (37.1 ?C) 97.7 ?F (36.5 ?C) 97.6 ?F (36.4 ?C) 97.6 ?F (36.4 ?C)  ?TempSrc: Oral Oral Oral Oral  ?SpO2: 97% 97% 97% 98%  ?Weight:      ?Height:      ? ?CBC:  ?Recent Labs  ?Lab 02/20/22 ?7564 02/21/22 ?0117 02/22/22 ?3329  ?WBC 6.8 7.7 9.4  ?NEUTROABS 4.2 4.3  --   ?HGB 10.0* 10.6* 9.9*  ?HCT 31.2* 33.4* 31.0*  ?MCV 94.3 93.8 92.3  ?PLT 162 218 245  ? ?Basic Metabolic Panel:  ?Recent Labs  ?Lab 02/20/22 ?5188 02/21/22 ?0117 02/22/22 ?4166  ?NA 146* 142 142  ?K 4.9 5.2* 5.0  ?CL 113* 112* 110  ?CO2 '25 25 25  '$ ?GLUCOSE 144* 121* 162*  ?BUN 35* 35* 37*  ?CREATININE 1.36* 1.35* 1.39*  ?CALCIUM 9.5 9.4 9.3  ?MG 1.7 1.9  --   ?PHOS 3.8 3.9  --   ? ?Lipid Panel:  ?No results for input(s): CHOL, TRIG, HDL, CHOLHDL, VLDL, LDLCALC in the last 168 hours. ? ?HgbA1c:  ?No results for input(s): HGBA1C in the last 168 hours. ? ?Urine Drug Screen: No results for input(s): LABOPIA, COCAINSCRNUR, LABBENZ, AMPHETMU, THCU, LABBARB in the last 168 hours.  ?Alcohol Level No results for input(s): ETH in the last 168 hours. ? ?IMAGING past 24 hours ?No results found. ? ?PHYSICAL EXAM ?General: Obese elderly Caucasian male patient, ill-appearing ?Respiratory: regular, unlabored respirations on room air ? ?NEURO:  ?Mental Status: Drowsy but can be aroused and follows commands. ?Speech/Language: speech is without dysarthria or aphasia.  Repetition, fluency, and comprehension intact. ?Mild left-sided inattention ?Cranial  Nerves:  ?II: PERRL. Left inferior quadrant visual field cut ?III, IV, VI: Right gaze deviation, able  to cross midline briefly.  ?V: Sensation is intact to light touch and symmetrical to face.  ?VII: Smile is symmetrical.   ?VIII: hearing intact to voice. ?IX, X: Phonation is normal.  ?XII: tongue is midline without fasciculations. ?Motor: 5/5 strength to RUE and BLE, LUE 1-2/5 strength.  Significant weakness of left grip and fingers.  Tone: is normal and bulk is normal ?Sensation- Intact to light touch bilaterally.   ?Gait- deferred ? ? ?ASSESSMENT/PLAN ?Mr. RHYDIAN BALDI is a 76 y.o. male with history of anemia, arthritis, GERD, DM2, HTN and a-fib on Eliquis presenting with vomiting and incoordiantion on the left side.  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  His Eliquis was reversed with [name] and he was transferred here.  His neurological status began to worsen and he was intubated.  Repeat CT demonstrated enlarging ICH.  Neurosurgery was consulted and decompressive suboccipital craniectomy was performed on 3/19 by Dr Ellene Route.  3% HTS was started for cerebral edema. Patient has been extubated since 3/23 and is doing well. Repeat head CT stable, off precedex. Patient has been transferred out of the ICU and is being evaluated for admission to CIR. ? ?ICH:  left cerebellar ICH s/p suboccipital decompression and hematoma evacuation likely due to Eliquis use and hypertension ?Code Stroke CT head acute 2.2 cm IPH in middle cerebellar peduncle with surrounding edema ?CTA head & neck no LVO or hemodynamically significant stenosis, no evidence of AVM of aneurysm, moderate right vertebral artery stenosis ?Follow up CT 3/18 increased size of left cerebellar IPH with effaced fourth ventricle ?Follow up CT 3/19 s/p left suboccipital craniectomy and cerebellar hematoma evacuation with moderate volume of residual posterior fossa blood ?MRI - 02/12/22 - Left suboccipital craniectomy for hematoma evacuation. Roughly 13 mL  residual left cerebellar blood tracking across midline to the right. Cerebellar edema with partial effacement of the 4th ventricle. But basilar cistern patency is adequate, with no ventriculomegaly. Trace intraventricular and subarachnoid hemorrhage.  ?CT 3/20 and 3/22 stable mass effect and no hydrocephalus ?2D Echo EF 55-60% ?LDL 25 ?HgbA1c 6.2 ?VTE prophylaxis - heparin subcu ?Eliquis (apixaban) daily prior to admission, now on No antithrombotic secondary to IPH  may consider resuming AC at 4 weeks ?Therapy recommendations:  CIR ?Disposition:  pending ? ?Obstructive hydrocephalus  ?S/p suboccipital decompression and hematoma evacuation ?CT 3/18 demonstrated effacement of fourth ventricle with developing obstructive hydrocephalus ?Suboccipital craniectomy performed by neurosurgery ?Parieto-occipital hemovac drain placed ?CT 3/20 and 3/22 and MRI 3/20 no hydrocephalus ?On keppra post op  for total 14 days (ends 02/24/22) ? ?Cerebral Edema ?Edema seen surrounding left cerebellar ICH on CT scan with risk for hydrocephalus ?3% HTS at 75 mL/hr->40cc->off->FW 200Q4 ?23.5% bolus given x 1 ?Na  145->149->151->157->156->158->156->161->162->158-> 157-> 154-> 147 ?Allow Na trending down gradually ? ?Hypertension ?Hypotensive ?Home meds:  lisinopril 40 mg daily ?stable ?Currently off norepinephrine ?Long-term BP goal normotensive ? ?Hyperlipidemia ?Home meds:  simvastatin 80 mg daily ?LDL 25, goal < 70 ?Hold off statin due to low LDL ? ?Diabetes type II Controlled ?Home meds:  metformin 500 mg BID ?HgbA1c 6.2, goal < 7.0 ?CBGs  ?SSI ? ?Respiratory failure ?Pleural effusion ?Patient intubated due to inability to protect airway after aspiration ?CCM on board ?Ventilator management per CCM ?S/p right thoracentesis 3/21 ?Extubated 3/23 ? ?Afib  ?On eliquis PTA ?Now bradycardia  ?No antithrombotics for now given ICH ? ?AKI ?Cre 1.24-1.62-1.73->1.86->1.83->2.03->1.99->1.77->1.66-> 1.61-> 1.47-> 1.34 ?Hyperkalemia K 6.1-5.7-5.6 s/p  Lokelma ->5.4 s/p Lokelma->4.5->4.3-> 4.1-> resolved ?BMP monitoring ?CCM on board ? ?MDS ?Followed with oncology, receiving infusions ?Hx of refractory anemia ?Hb 8.9->9.5->8.7->7.9->6.9 PRBC->7.9-> 7.8-> 8.4-> 9.2 ?WBC 11.0->9.3->4.0->5.0-> stable ?Platelet 190->207->220->123->130-> 138-> 147-> 156 ?CBC monitoring ? ?Other Stroke Risk Factors ?Advanced Age >/= 84  ?Former Cigarette smoker ?Obesity, Body mass index is 29.92 kg/m?., BMI >/= 30 associated with increased stroke risk, recommend weight loss, diet and exercise as appropriate  ? ?Other Active Problems ?Hyperkalemia, K 6.1-5.7-5.6 s/p Lokelma ->5.4 s/p Lokelma->4.5 ?Hypomagnesemia - Magnesium - 1.6->2.5 ->2.3 ? ? ?Hospital day # 12 ? ?  Patient's alertness is not much improved but left-sided strength seems to be improving.  Patient remains at recurrent risk for embolic strokes from A-fib since he is off anticoagulation.  Unfortunately it is not safe to start on anticoagulation so soon may need to make decision about resuming anticoagulation or alternative treatment like Watchman device in about a month from now when patient has recovered from the stroke.  Plan to start amantadine 100 mg twice daily to promote wakefulness.  Discussed with patient, his wife and Dr. Grandville Silos.  Greater than 50% time during this 35-minute visit was spent in counseling and coordination of care and discussion with patient and  care team and answering questions as well as his embolic stroke and intracerebral hemorrhage and risk benefit of resuming anticoagulation versus Watchman device. ? ?Antony Contras, MD ?Medical Director ?[facility] ?Pager: [phone] ?02/22/2022 1:07 PM ? ?To contact Stroke Continuity provider, please refer to [email or web address] ?After hours, contact General Neurology  ?

## 2022-02-22 NOTE — Progress Notes (Signed)
Nutrition Follow-up ? ?DOCUMENTATION CODES:  ? ?Not applicable ? ?INTERVENTION:  ?Continue TF via [name]: ?-Transition to Osmolite 1.5 @ 14ml/hr ?-61ml Prosource TF daily ?-295ml Q4H ? ?Provides 2240 kcals, 112 grams protein, 1032ml free water (224ml total free water with flushes) ? ?NUTRITION DIAGNOSIS:  ?Inadequate oral intake related to inability to eat as evidenced by NPO status. ?ongoing ? ?GOAL:  ?Patient will meet greater than or equal to 90% of their needs ?Met with TF at goal ? ?MONITOR:  ?TF tolerance, Diet advancement ? ?REASON FOR ASSESSMENT:  ?Consult, Ventilator ?Enteral/tube feeding initiation and management ? ?ASSESSMENT:  ?76 y.o. male presented [facility] ED with worsening L side coordination, vomiting, and upset stomach. PMH includes GERD, T2DM, HTN, and A. Fib. Pt admitted with an L ICH. ? ?3/18 - Intubated  ?3/19 - suboccipital craniectomy w/ R ventriculostomy ?3/20 - cortrak placed; tip in distal stomach/proximal duodenum  ?3/22 - EVD out, extubated ?3/26 - diet advanced to nectar thick full liquids ?3/27 - diet advanced to dysphagia 2 with nectar thick liquids ? ?Discussed pt with SLP who reports pt has just been made NPO again due to mentation. Also notes pt has had very poor po intake (5% x 1 meal documentation). Hopeful pt will be able to improve intake over next several days. Otherwise, will need to consider PEG. Pt's current TF regimen is vital 1.5 @ 54ml/hr via cortrak w/ 26ml Prosource TF daily. Will adjust TF regimen. Pt being evaluated for CIR admit.  ? ?UOP: 1267ml x24 hours ?I/O: -3325ml since admit ? ?Current weight: 91.9 kg ?Admit weight: 103.1 kg  ? ?Medications: ? heparin injection (subcutaneous)  5,000 Units Subcutaneous Q8H  ? insulin aspart  0-15 Units Subcutaneous Q4H  ? insulin aspart  2 Units Subcutaneous Q4H  ? insulin glargine-yfgn  30 Units Subcutaneous Daily  ? pantoprazole sodium  40 mg Per Tube Daily  ? polyethylene glycol  17 g Per Tube Daily  ? senna-docusate  1  tablet Per Tube BID  ? ?Labs: ?Recent Labs  ?Lab 02/16/22 ?0223 02/16/22 ?0756 02/20/22 ?7588 02/21/22 ?0117 02/22/22 ?3254  ?NA 156*   < > 146* 142 142  ?K 4.1   < > 4.9 5.2* 5.0  ?CL 127*   < > 113* 112* 110  ?CO2 23   < > $R'25 25 25  'Sp$ ?BUN 39*   < > 35* 35* 37*  ?CREATININE 1.61*   < > 1.36* 1.35* 1.39*  ?CALCIUM 9.1   < > 9.5 9.4 9.3  ?MG 1.8  --  1.7 1.9  --   ?PHOS  --   --  3.8 3.9  --   ?GLUCOSE 232*   < > 144* 121* 162*  ? < > = values in this interval not displayed.  ?CBGs: 118-182 x24 hours ? ?Diet Order:   ?Diet Order   ? ?       ?  Diet NPO time specified  Diet effective now       ?  ? ?  ?  ? ?  ? ?EDUCATION NEEDS:  ?Not appropriate for education at this time ? ?Skin:  Skin Assessment: Reviewed RN Assessment ? ?Last BM:  3/29 ? ?Height:  ?Ht Readings from Last 1 Encounters:  ?02/10/22 $RemoveBe'5\' 9"'KOGbjAkfr$  (1.753 m)  ? ?Weight:  ?Wt Readings from Last 1 Encounters:  ?02/20/22 91.9 kg  ? ?Ideal Body Weight:  72.7 kg ? ?BMI:  Body mass index is 29.92 kg/m?. ? ?Estimated Nutritional Needs:  ?Kcal:  2100-2300 ?Protein:  105-120 grams ?Fluid:  2.1-2.3 L ? ? ? ?Theone Stanley., MS, RD, LDN (she/her/hers) ?RD pager number and weekend/on-call pager number located in [place]. ?

## 2022-02-22 NOTE — Progress Notes (Signed)
Physical Therapy Treatment ?Patient Details ?Name: Vernon [name] ?MRN: [id number] ?DOB: 1946/06/28 ?Today's Date: 02/22/2022 ? ? ?History of Present Illness Pt. is a 76 y.o. male presenting to [facility] on 3/18 with worseing L side coordination along with N/V. CTH demonstrated a 2,2 cm L cerebellar peduncle ICH. He had an epsidoe fo emisis and developed hypoxemia, requiring intubation as well as undergoing an emergent craniotomy on 3/18; hematoma evacuation and drain placement. PMH significant for anemia, arthritis, GERD, DM2, GERD, HTN, afibb on Eliquis. ? ?  ?PT Comments  ? ? Pt. More alert and able to participate in some conversation today. Focus of session today was pericare, bed mobility, sitting balance, and standing trials. The patient tolerated well but only followed ~50% of commands.  Pt. Shows overall improvement with his alertness and volitional extremity movement. Overall cognition, functional mobility, balance, L sided inattention, and L sided hemiparesis are still limiting function. Pt. Would benefit from skilled PT to continue to address his cognition, functional mobility, balance, transfer ability, gait and cognition. Plan and discharge setting remains unchanged. Pt to follow acutely as appropriate.   ?Recommendations for follow up therapy are one component of a multi-disciplinary discharge planning process, led by the attending physician.  Recommendations may be updated based on patient status, additional functional criteria and insurance authorization. ? ?Follow Up Recommendations ? Acute inpatient rehab (3hours/day) ?  ?  ?Assistance Recommended at Discharge    ?Patient can return home with the following Two people to help with walking and/or transfers;Two people to help with bathing/dressing/bathroom;Assistance with cooking/housework;Assistance with feeding;Direct supervision/assist for financial management;Direct supervision/assist for medications management;Assist for transportation;Help with stairs or  ramp for entrance ?  ?Equipment Recommendations ? Wheelchair (measurements PT);Wheelchair cushion (measurements PT);Hospital bed;Other (comment)  ?  ?Recommendations for Other Services Rehab consult ? ? ?  ?Precautions / Restrictions Precautions ?Precautions: Fall ?Precaution Comments: [name] ?Restrictions ?Weight Bearing Restrictions: No  ?  ? ?Mobility ? Bed Mobility ?Overal bed mobility: Needs Assistance ?Bed Mobility: Supine to Sit, Sit to Supine, Rolling ?Rolling: Max assist (During pericare) ?  ?Supine to sit: Total assist, +2 for physical assistance ?Sit to supine: Total assist, +2 for physical assistance ?  ?General bed mobility comments: Pt. was seen moving all limbs but not on command. Total A for LE management, pivot, and trunk support. ?  ? ?Transfers ?Overall transfer level: Needs assistance ?  ?Transfers: Sit to/from Stand ?Sit to Stand: +2 physical assistance, Total assist ?  ?  ?  ?  ?  ?General transfer comment: Attempted 2x STS Total A for both. Pt. able to come to a full stand on second with rocking technique. He did not show active control during the stand and required total therapist support. ?  ? ?Ambulation/Gait ?  ?  ?  ?  ?  ?  ?  ?  ? ? ?Stairs ?  ?  ?  ?  ?  ? ? ?Wheelchair Mobility ?  ? ?Modified Rankin (Stroke Patients Only) ?Modified Rankin (Stroke Patients Only) ?Pre-Morbid Rankin Score: No symptoms ?Modified Rankin: Severe disability ? ? ?  ?Balance Overall balance assessment: Needs assistance ?Sitting-balance support: Single extremity supported, Feet supported ?Sitting balance-Leahy Scale: Fair ?Sitting balance - Comments: Pt able to sit with LE in lap for short period of time. He was able to correct minimally laterally leaning. But could not fully push up from the bed. ?Postural control: Posterior lean, Right lateral lean, Left lateral lean ?  ?Standing balance-Leahy Scale: Poor ?Standing balance  comment: dependent on external support ?  ?  ?  ?  ?  ?  ?  ?  ?  ?  ?  ?  ? ?   ?Cognition Arousal/Alertness: Lethargic ?Behavior During Therapy: Flat affect ?Overall Cognitive Status: Impaired/Different from baseline ?Area of Impairment: Memory, Awareness, Problem solving, Following commands, Safety/judgement ?  ?  ?  ?  ?  ?  ?  ?  ?Orientation Level: Disoriented to, Time ?  ?Memory: Decreased short-term memory ?Following Commands: Follows one step commands with increased time, Follows one step commands inconsistently (Follows ~50% commands) ?Safety/Judgement: Decreased awareness of safety, Decreased awareness of deficits (L sided inattention) ?Awareness: Intellectual ?Problem Solving: Slow processing, Difficulty sequencing, Requires verbal cues, Requires tactile cues ?General Comments: Pt. lethargic today but responds to some questioning. He was significantly more responsive while sitting up. Followed ~ 50% of commands but showed volitional movement of all limbs. ?  ?  ? ?  ?Exercises General Exercises - Upper Extremity ?Elbow Flexion: PROM, Both ?Elbow Extension: PROM, Both ?Other Exercises ?Other Exercises: Seated balance, proped on elbows and return to sitting with therapist support. Cues for reaching tasks but pt did not follow. ? ?  ?General Comments   ?  ?  ? ?Pertinent Vitals/Pain Pain Assessment ?Pain Assessment: No/denies pain  ? ? ?Home Living   ?  ?  ?  ?  ?  ?  ?  ?  ?  ?   ?  ?Prior Function    ?  ?  ?   ? ?PT Goals (current goals can now be found in the care plan section) Acute Rehab PT Goals ?Patient Stated Goal: PT goal to improve functional mobility quality in an effort to reduce caregiver burden. Pt unable to state goal ?PT Goal Formulation: Patient unable to participate in goal setting ?Time For Goal Achievement: 02/28/22 ?Potential to Achieve Goals: Fair ?Progress towards PT goals: Progressing toward goals ? ?  ?Frequency ? ? ? Min 4X/week ? ? ? ?  ?PT Plan Current plan remains appropriate  ? ? ?Co-evaluation   ?  ?  ?  ?  ? ?  ?AM-PAC PT "6 Clicks" Mobility   ?Outcome  Measure ? Help needed turning from your back to your side while in a flat bed without using bedrails?: A Lot ?Help needed moving from lying on your back to sitting on the side of a flat bed without using bedrails?: Total ?Help needed moving to and from a bed to a chair (including a wheelchair)?: Total ?Help needed standing up from a chair using your arms (e.g., wheelchair or bedside chair)?: Total ?Help needed to walk in hospital room?: Total ?Help needed climbing 3-5 steps with a railing? : Total ?6 Click Score: 7 ? ?  ?End of Session   ?Activity Tolerance: Patient limited by lethargy ?Patient left: in bed;with call bell/phone within reach;with bed alarm set;with family/visitor present ?Nurse Communication: Mobility status ?PT Visit Diagnosis: Muscle weakness (generalized) (M62.81);Difficulty in walking, not elsewhere classified (R26.2) ?  ? ? ?Time: 6160-7371 ?PT Time Calculation (min) (ACUTE ONLY): 34 min ? ?Charges:  $Therapeutic Activity: 8-22 mins ?$Neuromuscular Re-education: 8-22 mins          ?          ? ?Thermon Leyland, SPT ?Acute Rehab Services ? ? ? ?Thermon Leyland ?02/22/2022, 1:33 PM ? ?

## 2022-02-22 NOTE — Progress Notes (Signed)
Speech Language Pathology Treatment: Dysphagia  ?Patient Details ?Name: Vernon [name] ?MRN: [id number] ?DOB: [birth year] ?Today's Date: 02/22/2022 ?Time: 6314-9702 ?SLP Time Calculation (min) (ACUTE ONLY): 9 min ? ?Assessment / Plan / Recommendation ?Clinical Impression ? Pt was seen for dysphagia treatment. He was more alert than when he was last seen on 3/29. However, cooperation was reduced and encouragement was necessary for acceptance of p.o. trials. Pt's dentures have been brought, but pt did not allow them to be placed by this SLP. Pt accepted limited boluses of puree, and dysphagia 2 solids. However, bolus manipulation was absent despite prompts and cues, and these boluses ultimately fell from his mouth. Pt's mentation has been a barrier to p.o. intake for at least 1-2 days. An NPO status is recommended and SLP will follow to assess readiness for diet initiation as mentation improves.  ?  ?HPI HPI: Pt is a 76 y.o. male who presented to the ED on 3/18 with worseing L side coordination and N/V. 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 Surrounding edema with mild effacement of the fourth ventricle. Pt had an epsidoe fo emesis and developed hypoxemia, requiring intubation for airway protection. ETT 3/18-3/22. Pt s/p emergent craniotomy on 3/18, hematoma evacuation and drain placement. [name] placed 3/20. PMH: anemia, arthritis, GERD, DM2, GERD, HTN, afibb on Eliquis. ?  ?   ?SLP Plan ? Continue with current plan of care ? ?  ?  ?Recommendations for follow up therapy are one component of a multi-disciplinary discharge planning process, led by the attending physician.  Recommendations may be updated based on patient status, additional functional criteria and insurance authorization. ?  ? ?Recommendations  ?Diet recommendations: NPO ?Medication Administration: Via alternative means  ?   ?    ?   ? ? ? ? Oral  Care Recommendations: Oral care BID ?Follow Up Recommendations: Acute inpatient rehab (3hours/day) ?Assistance recommended at discharge: Frequent or constant Supervision/Assistance ?SLP Visit Diagnosis: Dysphagia, pharyngeal phase (R13.13) ?Plan: Continue with current plan of care ? ? ? ? ?  ?  ?[place] Deleo I. Hardin Negus, [place], CCC-SLP ?Acute Rehabilitation Services ?Office number [phone] ?Pager [phone] ? ? ?Vernon [name] ? ?02/22/2022, 9:30 AM ? ? ? ?

## 2022-02-23 LAB — GLUCOSE, CAPILLARY
Glucose-Capillary: 112 mg/dL — ABNORMAL HIGH (ref 70–99)
Glucose-Capillary: 121 mg/dL — ABNORMAL HIGH (ref 70–99)
Glucose-Capillary: 133 mg/dL — ABNORMAL HIGH (ref 70–99)
Glucose-Capillary: 173 mg/dL — ABNORMAL HIGH (ref 70–99)
Glucose-Capillary: 184 mg/dL — ABNORMAL HIGH (ref 70–99)

## 2022-02-23 LAB — CBC
HCT: 29.4 % — ABNORMAL LOW (ref 39.0–52.0)
Hemoglobin: 9.3 g/dL — ABNORMAL LOW (ref 13.0–17.0)
MCH: 29.4 pg (ref 26.0–34.0)
MCHC: 31.6 g/dL (ref 30.0–36.0)
MCV: 93 fL (ref 80.0–100.0)
Platelets: 229 10*3/uL (ref 150–400)
RBC: 3.16 MIL/uL — ABNORMAL LOW (ref 4.22–5.81)
RDW: 25.8 % — ABNORMAL HIGH (ref 11.5–15.5)
WBC: 9.6 10*3/uL (ref 4.0–10.5)
nRBC: 0.5 % — ABNORMAL HIGH (ref 0.0–0.2)

## 2022-02-23 LAB — BASIC METABOLIC PANEL
Anion gap: 7 (ref 5–15)
BUN: 35 mg/dL — ABNORMAL HIGH (ref 8–23)
CO2: 25 mmol/L (ref 22–32)
Calcium: 9.1 mg/dL (ref 8.9–10.3)
Chloride: 107 mmol/L (ref 98–111)
Creatinine, Ser: 1.34 mg/dL — ABNORMAL HIGH (ref 0.61–1.24)
GFR, Estimated: 55 mL/min — ABNORMAL LOW (ref >60)
Glucose, Bld: 182 mg/dL — ABNORMAL HIGH (ref 70–99)
Potassium: 4.9 mmol/L (ref 3.5–5.1)
Sodium: 139 mmol/L (ref 135–145)

## 2022-02-23 NOTE — Progress Notes (Signed)
[place] ?PROGRESS NOTE ? ? ? ?Vernon [name]  FXT:[id number] DOB: 1946-09-19 DOA: 02/10/2022 ?PCP: Baxter Hire, MD  ? ? ?No chief complaint on file. ? ? ?Brief Narrative:  ?76 yo with hx atrial fibrillation on eliquis, T2DM, anemia, GERD, aortic stenosis, MDS and multiple other medical problems presented with weakness and nausea, found to have a cerebellar hemorrhage.  He required intubation for airway protection and underwent emergent craniotomy on 3/18 with hematoma evacuation and drain placement.   ? ?Significant Events ?3/18 admission, intubation for airway protection after vomiting, underwent Suboccipital craniectomy and evacuation of cerebellar hemorrhage.  Placement of right parieto-occipital ventriculostomy by Dr. Ellene Route ?3/19 repeat CT head > s/p left suboccipital craniectomy and cerebellar hematoma evacuation with moderate residual volume of posterior fossa blood, improved patency of 4th ventricle, decreased lateral and 3rd ventricle size; EVD in place, otherwise stable CT ?3/20  MRI Brain>>Left suboccipital craniectomy for hematoma evacuation. Roughly 13 mL residual left cerebellar blood tracking across midline to the right. Cerebellar edema with partial effacement of the 4th ventricle. But basilar cistern patency is adequate, with no ventriculomegaly. Sequelae of attempted EVD placement across the splenium of the corpus callosum. Trace intraventricular and subarachnoid hemorrhage. Background mild to moderate nonspecific cerebral white matter signal changes. 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 ?3/21: patient on PSV; thoracentesis for R pleural effusion; off pressors; sedation switched to cleviprex; hypertonic saline stopped due to hypernatremia; started on unasyn, trach asp sent ?3/22: pulled EVD out; NSG aware and will just continue to monitor; extubated ?3/23: remains on precedex; off levo, transfused 1 unit PRBC, repeat CTH stable ?3/27 TRH now  primary ?Worsening mental status and weakness today, head CT pending ?  ? ? ?Assessment & Plan: ? Principal Problem: ?  ICH (intracerebral hemorrhage) ([place]) ?Active Problems: ?  Acute metabolic encephalopathy ?  Cerebral edema (HCC) ?  Obstructive hydrocephalus ([place]) ?  Dysphagia ?  Pleural effusion on right ?  Respiratory failure requiring intubation ([place]) ?  Cough ?  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  Chronic atrial fibrillation (HCC) ?  AKI (acute kidney injury) ([place]) ?  Essential hypertension ?  Type 2 diabetes mellitus with hyperglycemia (HCC) ?  Dyslipidemia ?  Hyperkalemia ?  Anemia ? ? ? ?Assessment and Plan: ?* ICH (intracerebral hemorrhage) ([place]) ?Presented to [facility] 3/18 with weakness and nausea, found to have cerebellar hemorrhage. 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 and HTN ?Head CT 3/18 with acute 2.2 cm intraparencymal hemorrhage in the L middle cerebellar peduncle with probable small volume adjacent extra axial extension, surrounding edema with mild effacement of 4th ventricle, no hydrocephalus ?CTA head/neck 3/18 without LVO, no evidence of vascular malformation or aneurysm in region of acute hemorrhage -> consider f/u after resolution of hemorrhage -- nondominant right verterbral artery origin stenosis, mild atherosclerosis ?CT 3/18 with increased size of intraparenchymal hematoma, increased subdural blood ?3/19 head CT s/p L suboccipital craniectomy and cerebellar hematoma evaculation with a moderate residual volume of posterior fossa blood, but improved patency of the 4th ventricle and decreased lateral and 3rd bentricle size, sequelae of temporary R parieto occipital EVD placement ?3/20 MRI brain L suboccipital craniectomy for hematoma evacuation, 13 ml residual L cerebellar blood tracking across midline to R, cerebellar edema with partial effacement of the 4th ventricle - sequelae of attempted EVD placement across splenium of corpus collosum ?3/20 head CT with no  unexpected or new unfavorable finding ?3/23 head CT with no  interval progression of cerebellar edema and hemorrhage ?3/26 head CT with decreased size and conspicuity of L superior cerebellar hemorrhage with associated edema, with slightly decreased mass effect on the 4th ventricle, redemonstrated hemorrahge in the R occipital ventriculostomy tract, slightly decreased density of hemorrhage in parietal ventriculostomy tract  ?Repeat head CT 3/28 with worsening mental status, worsening weakness (LUE flaccid) ?Discussed with neuro who evaluated the patient, and is noted noted unfortunately patient with neurological worsening however no reversible etiology noted on work-up as infectious work-up done negative. ?It is noted by neurology that patient had recurrent risk for embolic strokes from A-fib as he is off anticoagulation however unsafe to start anticoagulation at this time and may need to make a decision about resuming anticoagulation alternative treatment like Watchman device in about a month from now when patient has recovered from his acute stroke. ?-PT/OT/SLP. ?-Being assessed for inpatient rehab admission. ?No antithrombotic 2/2 ICH ?LDL 25, no statin, A1c 6.2 ?Echo EF 55-60%, no RWMA ?Will need outpatient follow-up with neurology and neurosurgery postdischarge from inpatient rehab.. ? ? ?Acute metabolic encephalopathy ?-Secondary to above.  ?Repeat head CT done 02/20/2022 with no evidence of interval acute intracranial abnormality, continued evolution of postop changes from prior left suboccipital craniectomy and left posterior fossa hematoma evacuation.  Residual hemorrhage within the left cerebellar hemisphere similar to slightly decreased from prior head CT of 02/18/2022.  Unchanged left cerebellar edema.  Stable mass effect with partial effacement of fourth ventricle.  No evidence of hydrocephalus. ?-Chest x-ray done unremarkable. ?-None nitrite negative leukocytes negative, 0-5 WBCs. ?-VBG with a pH of 7.42,  PCO2 of 43, PO2 of 51. ?-Neurology following. ?-Status post gentle hydration. ?-Seroquel was changed to as needed. ?Afebrile, wbc wnl ?Delirium precautions ? ?Obstructive hydrocephalus ([place]) ?3/19 suboccipital craniectomy and evacuation of cerebellar hemorrhage, placement of R parietooccipital ventriculostomy ?EVD pulled out 3/22 ?keppra post op x14 days (ends 02/24/22) ?NSGY recommending continued supportive care, PT/OT/SLP, dispo planning ? ? ?Cerebral edema (HCC) ?S/p hypertonic saline ?Now off, Na trending down and currently at 139 and seems to be stabilizing. ? ?Dysphagia ?cortrak in placed. ?-Patient seen by SLP and started on dysphagia 2 diet with nectar thick liquids and to hold trays if patient remains lethargic early on. ?-Patient made n.p.o. for now per speech therapy. ?-Continue tube feeds for now due to fluctuating mental status. ? ?Cough ?Remains on RA, worsening cough with AMS noted on 02/20/2022. ?-Repeat chest x-ray no acute abnormalities. ?-Symptomatic treatment. ? ?Respiratory failure requiring intubation ([place]) ?Now extubated on RA, follow CXR 3/28 - no acute cardiopulm abnormality ?S/p unasyn for concern for aspiration pneumonia. ? ?Pleural effusion on right ?CXR 2/24 with small symmetric effusions. ?S/p thoracentesis, consistent with transudate ?Cytology with no malignant cells ? ?AKI (acute kidney injury) ([place]) ?Improved, follow ? ?Chronic atrial fibrillation (HCC) ?Not on any antithrombotics at this time given ICH ?Will need to discuss going forward, per neuro, may need to make decision regarding resuming anticoagulation or alternative treatment, watchman (after he's recovered in abt a month)? ? 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 Rogue Bussing, follow outpatient ? ?Dyslipidemia ?- Statin. ? ?Type 2 diabetes mellitus with hyperglycemia ([place]) ?Hemoglobin A1c 6.2 (02/11/2022 ). ?CBG 184 this morning. ?Continue sliding scale insulin, NovoLog 2 units every 4 hours, Semglee 30 units  daily. ?Outpatient follow-up. ? ?Essential hypertension ?Controlled on current regimen of Coreg.   ?-Labetalol prn. ? ?Anemia ?H/H stable ?Hx of MDS. ?Outpatient f/u with hematology ?Transfusion threshhold Hgb <

## 2022-02-23 NOTE — H&P (Signed)
? ? ?Physical Medicine and Rehabilitation Admission H&P ? ?  ? ?HPI: Vernon [name] is a 76 year old right-handed male with history of chronic anemia, diabetes mellitus, hyperlipidemia, hypertension, atrial fibrillation maintained on Eliquis.  Per chart review patient lives with spouse.  Able to stay on main level with bed and bath.  Independent driving prior to admission and retired.  Wife works full-time.  Presented 02/10/2022 with left-sided weakness and incoordination as well as nausea vomiting.  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  Surrounding edema with mild effacement of the fourth ventricle.   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  Mild atherosclerosis without greater than 50% stenosis.  Eliquis was reversed with [name].  Patient underwent suboccipital craniectomy and evacuation of cerebellar hemorrhage.  Placement of right parieto-occipital ventriculostomy 02/11/2022 per Dr. Ellene Route.  Maintained on Keppra 14 days postoperatively ending 02/24/2022.  Follow-up CTs/MRI no hydrocephalus.  EVD was pulled 3/22.  Patient did require short-term intubation through 02/14/2022.  Postoperative course finding the left-sided pleural effusion and moderate to large right side pleural effusion and underwent thoracentesis with 1250 cc yield.  Latest chest x-ray showing no active disease.  Patient currently remains n.p.o. with nutritional tube feeds.  He was cleared to begin subcutaneous heparin for DVT prophylaxis 02/12/2022.  Neurology notes considering resuming AC at 4 weeks.  Bouts of restlessness and agitation with Seroquel added changed to Zyprexa via as well as the addition of amantadine with latest follow-up CT scan of the head 02/20/2022 showed no evidence of acute intracranial abnormality..  Palliative care therapy evaluations completed due to patient  decreased functional mobility left side weakness was admitted for a comprehensive rehab program. ? ?Review of Systems  ?Constitutional:  Negative for chills and fever.  ?HENT:  Negative for hearing loss.   ?Eyes:  Negative for blurred vision and double vision.  ?Respiratory:  Negative for cough and shortness of breath.   ?Cardiovascular:  Positive for palpitations.  ?Gastrointestinal:  Positive for nausea and vomiting.  ?     GERD  ?Genitourinary:  Positive for urgency. Negative for dysuria, flank pain and hematuria.  ?Musculoskeletal:  Positive for myalgias.  ?Skin:  Negative for rash.  ?Neurological:  Positive for weakness.  ?All other systems reviewed and are negative. ?Past Medical History:  ?Diagnosis Date  ? Anemia   ? Aortic stenosis   ? Arthritis   ? Complication of anesthesia   ? hard time getting bp up after knee replacement  ? Diabetes mellitus without complication ([place])   ? GERD (gastroesophageal reflux disease)   ? occ tums prn  ? History of hiatal hernia   ? Hypertension   ? 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[place])   ? ?Past Surgical History:  ?Procedure Laterality Date  ? CARPAL TUNNEL RELEASE  2012  ? CRANIOTOMY N/A 02/10/2022  ? Procedure: SUBOCCIPITAL CRANIECTOMY FOR EVACUATION OF CEREBELLAR HEMATOMA;  Surgeon: Kristeen Miss, MD;  Location: [facility];  Service: Neurosurgery;  Laterality: N/A;  ? JOINT REPLACEMENT Right 2010  ? SHOULDER ARTHROSCOPY WITH ROTATOR CUFF REPAIR AND OPEN BICEPS TENODESIS Right 11/09/2019  ? Procedure: RIGHT SHOULDER ARTHROSCOPY WITH SUBSCAPULARIS REPAIR, SUBACROMIAL DECOMPRESSION,MINI OPEN ROTATOR CUFF REPAIR;  Surgeon: Leim Fabry, MD;  Location: [facility];  Service: Orthopedics;  Laterality: Right;  ? ?Family History  ?Problem Relation Age of Onset  ? Cancer Maternal Uncle   ? ?Social History:  reports  that he quit smoking about 43 years ago. His smoking use included cigarettes. He has a 4.00 pack-year smoking history. He has never used smokeless tobacco. He reports current  alcohol use. He reports that he does not use drugs. ?Allergies: No Known Allergies ?Medications Prior to Admission  ?Medication Sig Dispense Refill  ? acetaminophen (TYLENOL) 650 MG CR tablet Take 650 mg by mouth every 8 (eight) hours as needed for pain.    ? allopurinol (ZYLOPRIM) 300 MG tablet Take 300 mg by mouth at bedtime.     ? apixaban (ELIQUIS) 5 MG TABS tablet Take 5 mg by mouth 2 (two) times daily.     ? ferrous sulfate 325 (65 FE) MG EC tablet Take 325 mg by mouth daily.    ? lisinopril (ZESTRIL) 40 MG tablet Take 40 mg by mouth at bedtime.     ? metFORMIN (GLUCOPHAGE) 500 MG tablet Take 500 mg by mouth 2 (two) times daily with a meal.     ? Multiple Vitamins-Minerals (CENTRUM SILVER PO) Take 1 tablet by mouth daily.    ? simvastatin (ZOCOR) 80 MG tablet Take 40 mg by mouth daily at 6 PM.     ? ? ? ? ?Home: ?Home Living ?Family/patient expects to be discharged to:: Private residence ?Living Arrangements: Spouse/significant other ?Available Help at Discharge: Family, Available 24 hours/day ?Type of Home: House ?Home Access: Stairs to enter ?Entrance Stairs-Number of Steps: 5 ?Entrance Stairs-Rails: Left, Right ?Home Layout: Multi-level, Able to live on main level with bedroom/bathroom ?Bathroom Shower/Tub: Walk-in shower ?Bathroom Toilet: Standard ?Home Equipment: None ?Additional Comments: supportive family, wife wroks full time ? Lives With: Spouse ?  ?Functional History: ?Prior Function ?Prior Level of Function : Independent/Modified Independent, Working/employed ?Mobility Comments: no AD ?ADLs Comments: drives, retired ? ?Functional Status:  ?Mobility: ?Bed Mobility ?Overal bed mobility: Needs Assistance ?Bed Mobility: Supine to Sit, Sit to Supine, Rolling ?Rolling: Total assist ?Supine to sit: Total assist, HOB elevated ?Sit to supine: Max assist ?General bed mobility comments: patient given opportunity and time to respond to commands with patient having difficulty processing ?Transfers ?Overall  transfer level: Needs assistance ?Equipment used: 2 person hand held assist ?Transfers: Sit to/from Stand ?Sit to Stand: Max assist, +2 physical assistance ?Bed to/from chair/wheelchair/BSC transfer type:: Step pivot ?Step pivot transfers: Mod assist, Max assist, +2 physical assistance (a third with chair) ?General transfer comment: Patient stood x4 from EOB with max assist +2 and able to lift each LE with assistance for weight shifting ?Ambulation/Gait ?General Gait Details: unable this date ?Pre-gait activities: began marching in place with maxA for weight shifting L/R and max directional verbal cues via bilat HHA. Pt then given RW to progress gait however pt with significant leaning and unable to pick up feet to ambulate, RN brough chair up behind patient ?  ? ?ADL: ?ADL ?Overall ADL's : Needs assistance/impaired ?Eating/Feeding: NPO ?Grooming: Wash/dry hands, Maximal assistance, Sitting ?Grooming Details (indicate cue type and reason): hand over hand sitting on EOB. ?Upper Body Dressing : Maximal assistance ?Upper Body Dressing Details (indicate cue type and reason): donned gown to cover back ?Functional mobility during ADLs: Total assistance, +2 for physical assistance, +2 for safety/equipment ?General ADL Comments: Patient with decreased level of arousal and requiring more assistance with grooming tasks ? ?Cognition: ?Cognition ?Overall Cognitive Status: Difficult to assess ?Arousal/Alertness: Awake/alert ?Orientation Level: Oriented to person, Disoriented to place, Disoriented to time, Disoriented to situation ?Year: 2023 ?Month: April ?Day of Week: Incorrect ?Attention: Focused, Sustained ?Focused Attention:  Impaired ?Focused Attention Impairment: Verbal complex ?Memory: Impaired ?Memory Impairment: Retrieval deficit, Decreased recall of new information (Immediate: 5/5 with repetion x3; delayed: 2/5; with cues: 2/3) ?Awareness: Impaired ?Awareness Impairment: Emergent impairment ?Problem Solving:  Impaired ?Problem Solving Impairment: Verbal complex (Money: 1/3; time: 0/1) ?Executive Function: Sequencing, Organizing ?Organizing: Impaired ?Organizing Impairment: Verbal complex (Backward digit span: 0/2) ?Cognition

## 2022-02-23 NOTE — Progress Notes (Signed)
Inpatient Rehab Admissions Coordinator:  ? ?Met with pt at bedside this AM and again this PM with wife present.  Worked with patient with OT session.  Pt demonstrates improvement in ability to follow commands and initiate functional mobility.  Do feel that best venue for him and best option for any improvement is going to be CIR.  ? ?I have a bed for this pt to admit to CIR on Saturday (4/1).  Dr. Grandville Silos in agreement.  Rehab MD (Dr. Letta Pate) to assess pt and confirm admission on Saturday.  Floor RN can call CIR at [phone] for report after 12pm on Saturday.  I have let pt/family and case manager know.   ? ?Shann Medal, PT, DPT ?Admissions Coordinator ?[phone] ?02/23/22  ?3:05 PM  ? ?

## 2022-02-23 NOTE — Plan of Care (Signed)
?  Problem: Safety: ?Goal: Ability to remain free from injury will improve ?Outcome: Progressing ?  ?Problem: Skin Integrity: ?Goal: Risk for impaired skin integrity will decrease ?Outcome: Progressing ?  ?Problem: Education: ?Goal: Knowledge of patient specific risk factors will improve (INDIVIDUALIZE FOR PATIENT) ?Outcome: Progressing ?  ?Problem: Intracerebral Hemorrhage Tissue Perfusion: ?Goal: Complications of Intracerebral Hemorrhage will be minimized ?Outcome: Progressing ?  ?

## 2022-02-23 NOTE — Progress Notes (Signed)
Occupational Therapy Treatment ?Patient Details ?Name: Vernon [name] ?MRN: [id number] ?DOB: [birth year] ?Today's Date: 02/23/2022 ? ? ?History of present illness Pt. is a 76 y.o. male presenting to [facility] on 3/18 with worseing L side coordination along with N/V. CTH demonstrated a 2,2 cm L cerebellar peduncle ICH. He had an epsidoe fo emisis and developed hypoxemia, requiring intubation as well as undergoing an emergent craniotomy on 3/18; hematoma evacuation and drain placement. PMH significant for anemia, arthritis, GERD, DM2, GERD, HTN, afibb on Eliquis. ?  ?OT comments ? Patient supine and difficult to arousal but would often respond when name was called. Patient allowed extra time to follow directions and required max assist +2 to get to EOB. Sitting balance addressed with patient demonstrating pushing with RUE and continued to demonstrate left lateral leaning when not pushing. Patient performed 4 stands from EOB with max assist +2 and able to lift BLE with assistance on weight shifting. Patient could benefit from continued rehab in AIR setting to improved mobility and transfers.   ? ?Recommendations for follow up therapy are one component of a multi-disciplinary discharge planning process, led by the attending physician.  Recommendations may be updated based on patient status, additional functional criteria and insurance authorization. ?   ?Follow Up Recommendations ? Acute inpatient rehab (3hours/day)  ?  ?Assistance Recommended at Discharge Frequent or constant Supervision/Assistance  ?Patient can return home with the following ? Two people to help with walking and/or transfers;Two people to help with bathing/dressing/bathroom;Assistance with cooking/housework;Direct supervision/assist for medications management;Direct supervision/assist for financial management;Assist for transportation;Help with stairs or ramp for entrance ?  ?Equipment Recommendations ? Other (comment) (TBD)  ?  ?Recommendations for Other  Services   ? ?  ?Precautions / Restrictions Precautions ?Precautions: Fall ?Precaution Comments: [name] ?Restrictions ?Weight Bearing Restrictions: No  ? ? ?  ? ?Mobility Bed Mobility ?Overal bed mobility: Needs Assistance ?Bed Mobility: Supine to Sit, Sit to Supine ?  ?  ?Supine to sit: Max assist, +2 for physical assistance ?Sit to supine: Total assist, +2 for physical assistance ?  ?General bed mobility comments: patient given opportunity and time to respond to commands with patient having difficulty processing ?  ? ?Transfers ?Overall transfer level: Needs assistance ?Equipment used: 2 person hand held assist ?Transfers: Sit to/from Stand ?Sit to Stand: Max assist, +2 physical assistance ?  ?  ?  ?  ?  ?General transfer comment: Patient stood x4 from EOB with max assist +2 and able to lift each LE with assistance for weight shifting ?  ?  ?Balance Overall balance assessment: Needs assistance ?Sitting-balance support: Single extremity supported, Feet supported ?Sitting balance-Leahy Scale: Poor ?Sitting balance - Comments: episodes of pushing with RUE with left lateral leaning ?Postural control: Posterior lean, Left lateral lean ?Standing balance support: Bilateral upper extremity supported ?Standing balance-Leahy Scale: Poor ?Standing balance comment: reliant on therapist for assistance with balance ?  ?  ?  ?  ?  ?  ?  ?  ?  ?  ?  ?   ? ?ADL either performed or assessed with clinical judgement  ? ?ADL   ?  ?  ?  ?  ?  ?  ?  ?  ?  ?  ?  ?  ?  ?  ?  ?  ?  ?  ?  ?  ?  ? ?Extremity/Trunk Assessment Upper Extremity Assessment ?RUE Deficits / Details: 2-5 grossly to RUE.  Weak grasp, unable to perform hand to mouth,  minimal wrist flexion and forearm supination.  Prior RCR. ?RUE Sensation: WNL ?RUE Coordination: decreased fine motor;decreased gross motor ?LUE Deficits / Details: Patient with trace shoulder and bicep, otherwise flaccid. ?LUE Sensation: WNL ?LUE Coordination: decreased fine motor;decreased gross  motor ?  ?  ?  ?  ?  ? ?Vision   ?Alignment/Gaze Preference: Gaze right ?Tracking/Visual Pursuits: Unable to hold eye position out of midline;Requires cues, head turns, or add eye shifts to track;Decreased smoothness of horizontal tracking ?Visual Fields: Left visual field deficit ?  ?Perception   ?  ?Praxis   ?  ? ?Cognition Arousal/Alertness: Lethargic ?Behavior During Therapy: Flat affect ?Overall Cognitive Status: Impaired/Different from baseline ?Area of Impairment: Memory, Awareness, Problem solving, Following commands, Safety/judgement ?  ?  ?  ?  ?  ?  ?  ?  ?Orientation Level: Disoriented to, Time ?  ?Memory: Decreased short-term memory ?Following Commands: Follows one step commands with increased time, Follows one step commands inconsistently ?Safety/Judgement: Decreased awareness of safety, Decreased awareness of deficits ?Awareness: Intellectual ?Problem Solving: Slow processing, Difficulty sequencing, Requires verbal cues, Requires tactile cues ?General Comments: eyes closed most of session with patient opening eyes occasionally when name was called ?  ?  ?   ?Exercises   ? ?  ?Shoulder Instructions   ? ? ?  ?General Comments    ? ? ?Pertinent Vitals/ Pain       Pain Assessment ?Pain Assessment: Faces ?Faces Pain Scale: No hurt ?Pain Intervention(s): Monitored during session ? ?Home Living   ?  ?  ?  ?  ?  ?  ?  ?  ?  ?  ?  ?  ?  ?  ?  ?  ?  ?  ? ?  ?Prior Functioning/Environment    ?  ?  ?  ?   ? ?Frequency ? Min 2X/week  ? ? ? ? ?  ?Progress Toward Goals ? ?OT Goals(current goals can now be found in the care plan section) ? Progress towards OT goals: Progressing toward goals ? ?Acute Rehab OT Goals ?OT Goal Formulation: Patient unable to participate in goal setting ?Time For Goal Achievement: 02/28/22 ?Potential to Achieve Goals: Fair ?ADL Goals ?Pt Will Perform Grooming: sitting;with mod assist ?Pt Will Transfer to Toilet: with +2 assist;with mod assist ?Additional ADL Goal #1: Pt will complete bed  mobility with mod A as a precursor to ADLs ?Additional ADL Goal #2: Pt will maintian midline posture while sitting EOB with min A for at least 5 minutes to perform ADLs  ?Plan Discharge plan remains appropriate   ? ?Co-evaluation ? ? ?   ?  ?  ?  ?  ? ?  ?AM-PAC OT "6 Clicks" Daily Activity     ?Outcome Measure ? ? Help from another person eating meals?: Total ?Help from another person taking care of personal grooming?: A Lot ?Help from another person toileting, which includes using toliet, bedpan, or urinal?: Total ?Help from another person bathing (including washing, rinsing, drying)?: A Lot ?Help from another person to put on and taking off regular upper body clothing?: A Lot ?Help from another person to put on and taking off regular lower body clothing?: Total ?6 Click Score: 9 ? ?  ?End of Session Equipment Utilized During Treatment: Gait belt ? ?OT Visit Diagnosis: Repeated falls (R29.6);Other abnormalities of gait and mobility (R26.89);Unsteadiness on feet (R26.81);Muscle weakness (generalized) (M62.81);Pain;Other (comment) ?  ?Activity Tolerance Patient limited by lethargy ?  ?Patient Left in bed;with  call bell/phone within reach;with bed alarm set;with family/visitor present ?  ?Nurse Communication Mobility status ?  ? ?   ? ?Time: 4144-3601 ?OT Time Calculation (min): 27 min ? ?Charges: OT General Charges ?$OT Visit: 1 Visit ?OT Treatments ?$Therapeutic Activity: 23-37 mins ? ?Lodema Hong, OTA ?Acute Rehabilitation Services  ?Pager [phone] ?Office [phone] ? ? ?[place] ?02/23/2022, 2:39 PM ?

## 2022-02-23 NOTE — Progress Notes (Signed)
Physical Therapy Treatment ?Patient Details ?Name: Vernon [name] ?MRN: [id number] ?DOB: [birth year] ?Today's Date: 02/23/2022 ? ? ?History of Present Illness Pt. is a 76 y.o. male presenting to [facility] on 3/18 with worseing L side coordination along with N/V. CTH demonstrated a 2,2 cm L cerebellar peduncle ICH. He had an epsidoe fo emisis and developed hypoxemia, requiring intubation as well as undergoing an emergent craniotomy on 3/18; hematoma evacuation and drain placement. PMH significant for anemia, arthritis, GERD, DM2, GERD, HTN, afibb on Eliquis. ? ?  ?PT Comments  ? ? Pt limited by altered mental status, with difficulty communicating or following commands. Pt requires significant assistance to perform functional mobility, largely due to impaired communication. Pt demonstrates good strength in all extremities and core, pushing himself up multiple times when losing balance at the edge of bed. Pt will benefit from continued acute PT services in an effort to reduce caregiver burden and falls risk. Pt's spouse and RN report pt being very lethargic in recent days, followed by over 24 hours of no sleep, which may be contributing to AMS.   ?Recommendations for follow up therapy are one component of a multi-disciplinary discharge planning process, led by the attending physician.  Recommendations may be updated based on patient status, additional functional criteria and insurance authorization. ? ?Follow Up Recommendations ? Acute inpatient rehab (3hours/day) ?  ?  ?Assistance Recommended at Discharge Intermittent Supervision/Assistance  ?Patient can return home with the following Two people to help with walking and/or transfers;Two people to help with bathing/dressing/bathroom;Assistance with cooking/housework;Assistance with feeding;Direct supervision/assist for financial management;Direct supervision/assist for medications management;Assist for transportation;Help with stairs or ramp for entrance ?  ?Equipment  Recommendations ? Wheelchair (measurements PT);Wheelchair cushion (measurements PT);Hospital bed;Other (comment)  ?  ?Recommendations for Other Services   ? ? ?  ?Precautions / Restrictions Precautions ?Precautions: Fall ?Precaution Comments: [name], bilateral mitts ?Restrictions ?Weight Bearing Restrictions: No  ?  ? ?Mobility ? Bed Mobility ?Overal bed mobility: Needs Assistance ?Bed Mobility: Supine to Sit, Sit to Supine, Rolling ?Rolling: Total assist ?  ?Supine to sit: Total assist, HOB elevated ?Sit to supine: Max assist ?  ?  ?  ? ?Transfers ?  ?  ?  ?  ?  ?  ?  ?  ?  ?  ?  ? ?Ambulation/Gait ?  ?  ?  ?  ?  ?  ?  ?  ? ? ?Stairs ?  ?  ?  ?  ?  ? ? ?Wheelchair Mobility ?  ? ?Modified Rankin (Stroke Patients Only) ?  ? ? ?  ?Balance Overall balance assessment: Needs assistance ?Sitting-balance support: Single extremity supported, Bilateral upper extremity supported, Feet supported ?Sitting balance-Leahy Scale: Poor ?Sitting balance - Comments: max-totalA initially, with continued sitting pt often leans far to left side but then is able to push back up into upright prior to falling onto bed. ?Postural control: Left lateral lean, Other (comment) (anterior) ?  ?  ?  ?  ?  ?  ?  ?  ?  ?  ?  ?  ?  ?  ?  ? ?  ?Cognition Arousal/Alertness: Awake/alert ?Behavior During Therapy: Restless ?Overall Cognitive Status: Difficult to assess ?  ?  ?  ?  ?  ?  ?  ?  ?  ?  ?  ?  ?  ?  ?  ?  ?General Comments: pt does not follow commands, moaning throughout session with limited communication. Pt appears confused and disoriented, with  little awareness of deficits and safety ?  ?  ? ?  ?Exercises   ? ?  ?General Comments General comments (skin integrity, edema, etc.): VSS, difficult to obtain BP as pt is not able to follow commands to keep arm still ?  ?  ? ?Pertinent Vitals/Pain Pain Assessment ?Pain Assessment: PAINAD ?Breathing: normal ?Negative Vocalization: repeated troubled calling out, loud moaning/groaning, crying ?Facial  Expression: sad, frightened, frown ?Body Language: tense, distressed pacing, fidgeting ?Consolability: unable to console, distract or reassure ?PAINAD Score: 6  ? ? ?Home Living   ?  ?  ?  ?  ?  ?  ?  ?  ?  ?   ?  ?Prior Function    ?  ?  ?   ? ?PT Goals (current goals can now be found in the care plan section) Acute Rehab PT Goals ?Patient Stated Goal: PT goal to improve functional mobility quality in an effort to reduce caregiver burden. Pt unable to state goal ?Progress towards PT goals: Not progressing toward goals - comment (limited by altered mental status) ? ?  ?Frequency ? ? ? Min 4X/week ? ? ? ?  ?PT Plan Current plan remains appropriate  ? ? ?Co-evaluation   ?  ?  ?  ?  ? ?  ?AM-PAC PT "6 Clicks" Mobility   ?Outcome Measure ? Help needed turning from your back to your side while in a flat bed without using bedrails?: Total ?Help needed moving from lying on your back to sitting on the side of a flat bed without using bedrails?: Total ?Help needed moving to and from a bed to a chair (including a wheelchair)?: Total ?Help needed standing up from a chair using your arms (e.g., wheelchair or bedside chair)?: Total ?Help needed to walk in hospital room?: Total ?Help needed climbing 3-5 steps with a railing? : Total ?6 Click Score: 6 ? ?  ?End of Session   ?Activity Tolerance: Other (comment) (limited due to altered mental status) ?Patient left: in bed;with call bell/phone within reach;with bed alarm set;with family/visitor present ?Nurse Communication: Mobility status ?PT Visit Diagnosis: Muscle weakness (generalized) (M62.81);Difficulty in walking, not elsewhere classified (R26.2) ?  ? ? ?Time: 0258-5277 ?PT Time Calculation (min) (ACUTE ONLY): 24 min ? ?Charges:  $Therapeutic Activity: 23-37 mins          ?          ? ?Zenaida Niece, PT, DPT ?Acute Rehabilitation ?Pager: [phone] ?Office [phone] ? ? ? ?Zenaida Niece ?02/23/2022, 5:35 PM ? ?

## 2022-02-23 NOTE — Progress Notes (Addendum)
Speech Language Pathology Treatment: Dysphagia  ?Patient Details ?Name: Vernon [name] ?MRN: [id number] ?DOB: December 11, 1945 ?Today's Date: 02/23/2022 ?Time: 9935-7017 ?SLP Time Calculation (min) (ACUTE ONLY): 10 min ? ?Assessment / Plan / Recommendation ?Clinical Impression ? Pt seen for skilled SLP treatment to determine if pt is appropriate for po diet at this time. Pt found on all fours in his bed and required total assist to reposition in bed - requiring several minutes of repositioning.  His voice is strong and he remains agitated.  He did however advise that he wanted water thus SLP proceeded with trials of water to determine if may improve his agitation and QOL.  Pt's voice is strong and clear fortunately.   ? ?Pt provided with water and applejuice via tsp, he did no adequately open oral cavity to accept intake nor did he swallow boluses offered.  Pt spit out water with offerings despite total cues and his desire for intake.  RN, Adella Nissen, arrived to room to provide medication to pt.  SLP asked that she allow SLP to finish session given pt's severely impaired attention.  SLP elevated voice to pt to attempt to improve attention given RN voice was very elevated and causes extra distractions to the pt *he was looking to RN.  This did not improve attention and session was discontinued with RN left with pt.   ? ?Will continue efforts.  SLP questions if pt may benefit from consideration for long term alternative means of nutrition given level of ongoing cognitive deficits now allowing him to have po intake. Recommend continue npo with adequate oral care to decrease oral bacteria load for maximal airway protection. ?  ?HPI HPI: Pt is a 76 y.o. male who presented to the ED on 3/18 with worseing L side coordination and N/V. 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 Surrounding edema with mild effacement of the  fourth ventricle. Pt had an epsidoe fo emesis and developed hypoxemia, requiring intubation for airway protection. ETT 3/18-3/22. Pt s/p emergent craniotomy on 3/18, hematoma evacuation and drain placement. [name] placed 3/20. PMH: anemia, arthritis, GERD, DM2, GERD, HTN, afibb on Eliquis.  Pt follow up for swallow conducted as pt with agitation and AMS and was made NPO prior date. ?  ?   ?SLP Plan ? Continue with current plan of care ? ?  ?  ?Recommendations for follow up therapy are one component of a multi-disciplinary discharge planning process, led by the attending physician.  Recommendations may be updated based on patient status, additional functional criteria and insurance authorization. ?  ? ?Recommendations  ?Diet recommendations: NPO ?Medication Administration: Via alternative means  ?   ?    ?   ? ? ? ? Oral Care Recommendations: Oral care BID ?Follow Up Recommendations: Acute inpatient rehab (3hours/day) ?Assistance recommended at discharge: Frequent or constant Supervision/Assistance ?SLP Visit Diagnosis: Dysphagia, unspecified (R13.10);Dysphagia, oropharyngeal phase (R13.12) ?Plan: Continue with current plan of care ? ? ? ? ?  ?  ?Kathleen Lime, MS CCC SLP ?Acute Rehab Services ?Office [phone] ?Pager [phone] ? ? ?Macario Golds ? ?02/23/2022, 9:11 AM ?

## 2022-02-23 NOTE — Progress Notes (Addendum)
STROKE TEAM PROGRESS NOTE  ? ?INTERVAL HISTORY ?Patient is seen in his room with no family at the bedside.  He has been hemodynamically stable overnight and his neurological exam is stable.  He is able to move the left upper and lower extremity more today than yesterday.  He has had no acute events overnight. ? ? ?Vitals:  ? 02/22/22 1927 02/23/22 0412 02/23/22 0748 02/23/22 1134  ?BP: 113/66 126/70 140/73 126/70  ?Pulse: (!) 56 (!) 58 62 62  ?Resp: '18 19 16 16  '$ ?Temp: 98 ?F (36.7 ?C) 98.6 ?F (37 ?C) 98.2 ?F (36.8 ?C) 98 ?F (36.7 ?C)  ?TempSrc: Oral Oral Oral Oral  ?SpO2: 100% 96% 100%   ?Weight:      ?Height:      ? ?CBC:  ?Recent Labs  ?Lab 02/20/22 ?3818 02/21/22 ?0117 02/22/22 ?2993 02/23/22 ?7169  ?WBC 6.8 7.7 9.4 9.6  ?NEUTROABS 4.2 4.3  --   --   ?HGB 10.0* 10.6* 9.9* 9.3*  ?HCT 31.2* 33.4* 31.0* 29.4*  ?MCV 94.3 93.8 92.3 93.0  ?PLT 162 218 245 229  ? ? ?Basic Metabolic Panel:  ?Recent Labs  ?Lab 02/20/22 ?6789 02/21/22 ?0117 02/22/22 ?3810 02/23/22 ?1751  ?NA 146* 142 142 139  ?K 4.9 5.2* 5.0 4.9  ?CL 113* 112* 110 107  ?CO2 '25 25 25 25  '$ ?GLUCOSE 144* 121* 162* 182*  ?BUN 35* 35* 37* 35*  ?CREATININE 1.36* 1.35* 1.39* 1.34*  ?CALCIUM 9.5 9.4 9.3 9.1  ?MG 1.7 1.9  --   --   ?PHOS 3.8 3.9  --   --   ? ? ?Lipid Panel:  ?No results for input(s): CHOL, TRIG, HDL, CHOLHDL, VLDL, LDLCALC in the last 168 hours. ? ?HgbA1c:  ?No results for input(s): HGBA1C in the last 168 hours. ? ?Urine Drug Screen: No results for input(s): LABOPIA, COCAINSCRNUR, LABBENZ, AMPHETMU, THCU, LABBARB in the last 168 hours.  ?Alcohol Level No results for input(s): ETH in the last 168 hours. ? ?IMAGING past 24 hours ?No results found. ? ?PHYSICAL EXAM ?General: Obese elderly Caucasian male patient, ill-appearing ?Respiratory: regular, unlabored respirations on room air ? ?NEURO:  ?Mental Status: Alert and follows commands. ?Speech/Language: speech is without dysarthria or aphasia.  Repetition, fluency, and comprehension intact. ?Mild  left-sided inattention ?Cranial Nerves:  ?II: PERRL.  ?III, IV, VI: Right gaze deviation, able  to cross midline briefly.  ?V: Sensation is intact to light touch and symmetrical to face.  ?VII: Smile is symmetrical.   ?VIII: hearing intact to voice. ?IX, X: Phonation is normal.  ?XII: tongue is midline without fasciculations. ?Motor: 5/5 strength to RUE and RLE, LUE 2-3/5 LLE 3/5 strength.  Significant weakness of left grip and fingers.  Tone: is normal and bulk is normal ?Sensation- Intact to light touch bilaterally.   ?Gait- deferred ? ? ?ASSESSMENT/PLAN ?[name] is a 76 y.o. male with history of anemia, arthritis, GERD, DM2, HTN and a-fib on Eliquis presenting with vomiting and incoordiantion on the left side.  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  His Eliquis was reversed with [name] and he was transferred here.  His neurological status began to worsen and he was intubated.  Repeat CT demonstrated enlarging ICH.  Neurosurgery was consulted and decompressive suboccipital craniectomy was performed on 3/19 by Dr Ellene Route.  3% HTS was started for cerebral edema. Patient has been extubated since 3/23 and is doing well. Repeat head CT stable, off precedex. Patient has been transferred out  of the ICU and is being evaluated for admission to CIR. ? ?ICH:  left cerebellar ICH s/p suboccipital decompression and hematoma evacuation likely due to Eliquis use and hypertension ?Code Stroke CT head acute 2.2 cm IPH in middle cerebellar peduncle with surrounding edema ?CTA head & neck no LVO or hemodynamically significant stenosis, no evidence of AVM of aneurysm, moderate right vertebral artery stenosis ?Follow up CT 3/18 increased size of left cerebellar IPH with effaced fourth ventricle ?Follow up CT 3/19 s/p left suboccipital craniectomy and cerebellar hematoma evacuation with moderate volume of residual posterior fossa blood ?MRI - 02/12/22 - Left suboccipital craniectomy for hematoma evacuation. Roughly 13 mL  residual left cerebellar blood tracking across midline to the right. Cerebellar edema with partial effacement of the 4th ventricle. But basilar cistern patency is adequate, with no ventriculomegaly. Trace intraventricular and subarachnoid hemorrhage.  ?CT 3/20 and 3/22 stable mass effect and no hydrocephalus ?2D Echo EF 55-60% ?LDL 25 ?HgbA1c 6.2 ?VTE prophylaxis - heparin subcu ?Eliquis (apixaban) daily prior to admission, now on No antithrombotic secondary to IPH  may consider resuming AC at 4 weeks ?Therapy recommendations:  CIR ?Disposition:  pending ? ?Obstructive hydrocephalus  ?S/p suboccipital decompression and hematoma evacuation ?CT 3/18 demonstrated effacement of fourth ventricle with developing obstructive hydrocephalus ?Suboccipital craniectomy performed by neurosurgery ?Parieto-occipital hemovac drain placed ?CT 3/20 and 3/22 and MRI 3/20 no hydrocephalus ?On keppra post op  for total 14 days (ends 02/24/22) ? ?Cerebral Edema ?Edema seen surrounding left cerebellar ICH on CT scan with risk for hydrocephalus ?3% HTS at 75 mL/hr->40cc->off->FW 200Q4 ?23.5% bolus given x 1 ?Na  145->149->151->157->156->158->156->161->162->158-> 157-> 154-> 147-> 139 ?Allow Na trending down gradually ? ?Hypertension ?Hypotensive ?Home meds:  lisinopril 40 mg daily ?stable ?Currently off norepinephrine ?Long-term BP goal normotensive ? ?Hyperlipidemia ?Home meds:  simvastatin 80 mg daily ?LDL 25, goal < 70 ?Hold off statin due to low LDL ? ?Diabetes type II Controlled ?Home meds:  metformin 500 mg BID ?HgbA1c 6.2, goal < 7.0 ?CBGs  ?SSI ? ?Respiratory failure ?Pleural effusion ?Patient intubated due to inability to protect airway after aspiration ?CCM on board ?Ventilator management per CCM ?S/p right thoracentesis 3/21 ?Extubated 3/23 ? ?Afib  ?On eliquis PTA ?Now bradycardia  ?No antithrombotics for now given ICH ? ?AKI ?Cre 1.24-1.62-1.73->1.86->1.83->2.03->1.99->1.77->1.66-> 1.61-> 1.47-> 1.34 ?Hyperkalemia K 6.1-5.7-5.6  s/p Lokelma ->5.4 s/p Lokelma->4.5->4.3-> 4.1-> resolved ?BMP monitoring ?CCM on board ? ?MDS ?Followed with oncology, receiving infusions ?Hx of refractory anemia ?Hb 8.9->9.5->8.7->7.9->6.9 PRBC->7.9-> 7.8-> 8.4-> 9.2-> 9.3-> stable ?WBC 11.0->9.3->4.0->5.0-> stable ?Platelet 190->207->220->123->130-> 138-> 147-> 156-> 229 ?CBC monitoring ? ?Other Stroke Risk Factors ?Advanced Age >/= 20  ?Former Cigarette smoker ?Obesity, Body mass index is 29.92 kg/m?., BMI >/= 30 associated with increased stroke risk, recommend weight loss, diet and exercise as appropriate  ? ?Other Active Problems ?Hyperkalemia, K 6.1-5.7-5.6 s/p Lokelma ->5.4 s/p Lokelma->4.5 ?Hypomagnesemia - Magnesium - 1.6->2.5 ->2.3 ? ? ?Hospital day # 13 ? ?Crystal , MSN, AGACNP-BC ?[facility] ?See Amion for schedule and pager information ?02/23/2022 11:44 AM ? I have personally obtained history,examined this patient, reviewed notes, independently viewed imaging studies, participated in medical decision making and plan of care.ROS completed by me personally and pertinent positives fully documented  I have made any additions or clarifications directly to the above note. Agree with note above.  Transfer to inpatient rehab when bed available.  Follow-up as an outpatient stroke clinic in 2 months.  Stroke team will sign off.  Kindly  call for questions ? ?Antony Contras, MD ?Medical Director ?[facility] ?Pager: [phone] ?02/23/2022 12:54 PM ? ? ?To contact Stroke Continuity provider, please refer to [email or web address] ?After hours, contact General Neurology  ?

## 2022-02-24 LAB — BASIC METABOLIC PANEL
Anion gap: 5 (ref 5–15)
BUN: 38 mg/dL — ABNORMAL HIGH (ref 8–23)
CO2: 25 mmol/L (ref 22–32)
Calcium: 9 mg/dL (ref 8.9–10.3)
Chloride: 106 mmol/L (ref 98–111)
Creatinine, Ser: 1.42 mg/dL — ABNORMAL HIGH (ref 0.61–1.24)
GFR, Estimated: 52 mL/min — ABNORMAL LOW (ref >60)
Glucose, Bld: 172 mg/dL — ABNORMAL HIGH (ref 70–99)
Potassium: 4.8 mmol/L (ref 3.5–5.1)
Sodium: 136 mmol/L (ref 135–145)

## 2022-02-24 LAB — CBC WITH DIFFERENTIAL/PLATELET
Abs Immature Granulocytes: 0.09 10*3/uL — ABNORMAL HIGH (ref 0.00–0.07)
Basophils Absolute: 0 10*3/uL (ref 0.0–0.1)
Basophils Relative: 0 %
Eosinophils Absolute: 0.2 10*3/uL (ref 0.0–0.5)
Eosinophils Relative: 2 %
HCT: 27.1 % — ABNORMAL LOW (ref 39.0–52.0)
Hemoglobin: 9 g/dL — ABNORMAL LOW (ref 13.0–17.0)
Immature Granulocytes: 1 %
Lymphocytes Relative: 16 %
Lymphs Abs: 1.6 10*3/uL (ref 0.7–4.0)
MCH: 30.2 pg (ref 26.0–34.0)
MCHC: 33.2 g/dL (ref 30.0–36.0)
MCV: 90.9 fL (ref 80.0–100.0)
Monocytes Absolute: 0.8 10*3/uL (ref 0.1–1.0)
Monocytes Relative: 8 %
Neutro Abs: 7.4 10*3/uL (ref 1.7–7.7)
Neutrophils Relative %: 73 %
Platelets: 258 10*3/uL (ref 150–400)
RBC: 2.98 MIL/uL — ABNORMAL LOW (ref 4.22–5.81)
RDW: 25.6 % — ABNORMAL HIGH (ref 11.5–15.5)
WBC: 10.1 10*3/uL (ref 4.0–10.5)
nRBC: 0.3 % — ABNORMAL HIGH (ref 0.0–0.2)

## 2022-02-24 LAB — GLUCOSE, CAPILLARY
Glucose-Capillary: 163 mg/dL — ABNORMAL HIGH (ref 70–99)
Glucose-Capillary: 128 mg/dL — ABNORMAL HIGH (ref 70–99)
Glucose-Capillary: 180 mg/dL — ABNORMAL HIGH (ref 70–99)
Glucose-Capillary: 141 mg/dL — ABNORMAL HIGH (ref 70–99)
Glucose-Capillary: 182 mg/dL — ABNORMAL HIGH (ref 70–99)

## 2022-02-24 MED ORDER — CHLORHEXIDINE GLUCONATE 0.12 % MT SOLN
OROMUCOSAL | Status: AC
  Administered 2022-02-24: 15 mL via OROMUCOSAL
  Filled 2022-02-24: qty 15

## 2022-02-24 MED ORDER — ACETAMINOPHEN 325 MG PO TABS
650.0000 mg | ORAL_TABLET | Freq: Four times a day (QID) | ORAL | Status: DC | PRN
  Administered 2022-02-24 (×2): 650 mg via ORAL
  Filled 2022-02-24 (×2): qty 2

## 2022-02-24 MED ORDER — OLANZAPINE 5 MG PO TBDP
2.5000 mg | ORAL_TABLET | ORAL | Status: DC
  Administered 2022-02-24 – 2022-02-26 (×4): 2.5 mg via ORAL
  Filled 2022-02-24 (×6): qty 0.5

## 2022-02-24 MED ORDER — OLANZAPINE 5 MG PO TBDP
2.5000 mg | ORAL_TABLET | Freq: Two times a day (BID) | ORAL | Status: DC
  Administered 2022-02-24: 2.5 mg via ORAL
  Filled 2022-02-24 (×2): qty 0.5

## 2022-02-24 MED ORDER — AMANTADINE HCL 50 MG/5ML PO SOLN
100.0000 mg | Freq: Two times a day (BID) | ORAL | Status: DC
  Administered 2022-02-25 – 2022-02-26 (×3): 100 mg
  Filled 2022-02-24 (×4): qty 10

## 2022-02-24 NOTE — Progress Notes (Addendum)
[facility] ?PROGRESS NOTE ? ? ? ?Vernon [name]  AYT:[id number] DOB: Mar 05, 1946 DOA: 02/10/2022 ?PCP: Baxter Hire, MD  ? ? ?No chief complaint on file. ? ? ?Brief Narrative:  ?76 yo with hx atrial fibrillation on eliquis, T2DM, anemia, GERD, aortic stenosis, MDS and multiple other medical problems presented with weakness and nausea, found to have a cerebellar hemorrhage.  He required intubation for airway protection and underwent emergent craniotomy on 3/18 with hematoma evacuation and drain placement.   ? ?Significant Events ?3/18 admission, intubation for airway protection after vomiting, underwent Suboccipital craniectomy and evacuation of cerebellar hemorrhage.  Placement of right parieto-occipital ventriculostomy by Dr. Ellene Route ?3/19 repeat CT head > s/p left suboccipital craniectomy and cerebellar hematoma evacuation with moderate residual volume of posterior fossa blood, improved patency of 4th ventricle, decreased lateral and 3rd ventricle size; EVD in place, otherwise stable CT ?3/20  MRI Brain>>Left suboccipital craniectomy for hematoma evacuation. Roughly 13 mL residual left cerebellar blood tracking across midline to the right. Cerebellar edema with partial effacement of the 4th ventricle. But basilar cistern patency is adequate, with no ventriculomegaly. Sequelae of attempted EVD placement across the splenium of the corpus callosum. Trace intraventricular and subarachnoid hemorrhage. Background mild to moderate nonspecific cerebral white matter signal changes. 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 ?3/21: patient on PSV; thoracentesis for R pleural effusion; off pressors; sedation switched to cleviprex; hypertonic saline stopped due to hypernatremia; started on unasyn, trach asp sent ?3/22: pulled EVD out; NSG aware and will just continue to monitor; extubated ?3/23: remains on precedex; off levo, transfused 1 unit PRBC, repeat CTH stable ?3/27 TRH now  primary ?Worsening mental status and weakness today, head CT pending ?  ? ? ?Assessment & Plan: ? Principal Problem: ?  ICH (intracerebral hemorrhage) ([place]) ?Active Problems: ?  Acute metabolic encephalopathy ?  Cerebral edema (HCC) ?  Obstructive hydrocephalus ([place]) ?  Dysphagia ?  Pleural effusion on right ?  Respiratory failure requiring intubation ([place]) ?  Cough ?  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[place]) ?  Chronic atrial fibrillation (HCC) ?  AKI (acute kidney injury) ([place]) ?  Essential hypertension ?  Type 2 diabetes mellitus with hyperglycemia (HCC) ?  Dyslipidemia ?  Hyperkalemia ?  Anemia ? ? ? ?Assessment and Plan: ?* ICH (intracerebral hemorrhage) ([place]) ?Presented to [facility] 3/18 with weakness and nausea, found to have cerebellar hemorrhage. 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 and HTN ?Head CT 3/18 with acute 2.2 cm intraparencymal hemorrhage in the L middle cerebellar peduncle with probable small volume adjacent extra axial extension, surrounding edema with mild effacement of 4th ventricle, no hydrocephalus ?CTA head/neck 3/18 without LVO, no evidence of vascular malformation or aneurysm in region of acute hemorrhage -> consider f/u after resolution of hemorrhage -- nondominant right verterbral artery origin stenosis, mild atherosclerosis ?CT 3/18 with increased size of intraparenchymal hematoma, increased subdural blood ?3/19 head CT s/p L suboccipital craniectomy and cerebellar hematoma evaculation with a moderate residual volume of posterior fossa blood, but improved patency of the 4th ventricle and decreased lateral and 3rd bentricle size, sequelae of temporary R parieto occipital EVD placement ?3/20 MRI brain L suboccipital craniectomy for hematoma evacuation, 13 ml residual L cerebellar blood tracking across midline to R, cerebellar edema with partial effacement of the 4th ventricle - sequelae of attempted EVD placement across splenium of corpus collosum ?3/20 head CT with no  unexpected or new unfavorable finding ?3/23 head CT with no  interval progression of cerebellar edema and hemorrhage ?3/26 head CT with decreased size and conspicuity of L superior cerebellar hemorrhage with associated edema, with slightly decreased mass effect on the 4th ventricle, redemonstrated hemorrahge in the R occipital ventriculostomy tract, slightly decreased density of hemorrhage in parietal ventriculostomy tract  ?Repeat head CT 3/28 with worsening mental status, worsening weakness (LUE flaccid) ?Discussed with neuro who evaluated the patient, and is noted noted unfortunately patient with neurological worsening however no reversible etiology noted on work-up as infectious work-up done negative. ?It is noted by neurology that patient had recurrent risk for embolic strokes from A-fib as he is off anticoagulation however unsafe to start anticoagulation at this time and may need to make a decision about resuming anticoagulation alternative treatment like Watchman device in about a month from now when patient has recovered from his acute stroke. ?-PT/OT/SLP. ?-Being assessed for inpatient rehab admission. ?-Per RN patient seen by inpatient rehab MD and patient noted to be agitated today and as such admission to inpatient rehab held for today. ?No antithrombotic 2/2 ICH ?LDL 25, no statin, A1c 6.2 ?Echo EF 55-60%, no RWMA ?-Zyprexa 2.5 mg twice daily for agitation and uptitrate as needed. ?Will need outpatient follow-up with neurology and neurosurgery postdischarge from inpatient rehab.. ? ? ?Acute metabolic encephalopathy ?-Secondary to above.  ?Repeat head CT done 02/20/2022 with no evidence of interval acute intracranial abnormality, continued evolution of postop changes from prior left suboccipital craniectomy and left posterior fossa hematoma evacuation.  Residual hemorrhage within the left cerebellar hemisphere similar to slightly decreased from prior head CT of 02/18/2022.  Unchanged left cerebellar edema.   Stable mass effect with partial effacement of fourth ventricle.  No evidence of hydrocephalus. ?-Chest x-ray done unremarkable. ?-None nitrite negative leukocytes negative, 0-5 WBCs. ?-VBG with a pH of 7.42, PCO2 of 43, PO2 of 51. ?-Neurology following. ?-Status post gentle hydration. ?-Seroquel was changed to as needed. ?Afebrile, wbc wnl ?Delirium precautions ?-Patient noted to be agitated today and per RN received some Seroquel overnight with no significant improvement. ?-Discontinue Seroquel. ?-Zyprexa 2.5 mg twice daily and uptitrate as needed for better control of agitation. ? ?Obstructive hydrocephalus ([place]) ?3/19 suboccipital craniectomy and evacuation of cerebellar hemorrhage, placement of R parietooccipital ventriculostomy ?EVD pulled out 3/22 ?keppra post op x14 days (ends 02/24/22) ?NSGY recommending continued supportive care, PT/OT/SLP, dispo planning ? ? ?Cerebral edema (HCC) ?S/p hypertonic saline ?Now off, Na trending down and currently at 136 and seems to be stabilizing. ? ?Dysphagia ?cortrak in placed. ?-Patient seen by SLP and started on dysphagia 2 diet with nectar thick liquids and to hold trays if patient remains lethargic early on. ?-Patient made n.p.o. for now per speech therapy. ?-Continue tube feeds for now due to fluctuating mental status. ? ?Cough ?Remains on RA, worsening cough with AMS noted on 02/20/2022. ?-Repeat chest x-ray no acute abnormalities. ?-Symptomatic treatment. ? ?Respiratory failure requiring intubation ([place]) ?Now extubated on RA, follow CXR 3/28 - no acute cardiopulm abnormality ?S/p unasyn for concern for aspiration pneumonia. ? ?Pleural effusion on right ?CXR 2/24 with small symmetric effusions. ?S/p thoracentesis, consistent with transudate ?Cytology with no malignant cells ? ?AKI (acute kidney injury) ([place]) ?Improved, follow ?-May need gentle hydration. ? ?Chronic atrial fibrillation ([place]) ?Not on any antithrombotics at this time given ICH ?Will need to discuss going  forward, per neuro, may need to make decision regarding resuming anticoagulation or alternative treatment, watchman (after he's recovered in abt a month)? ? ?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[place]) ?Follows with Dr

## 2022-02-24 NOTE — Progress Notes (Addendum)
Inpatient Rehab Admissions Coordinator:  ?Hold admission to CIR d/t agitation per Dr. Letta Pate. He will reassess tomorrow. ? ?Gayland Curry, MS, CCC-SLP ?Admissions Coordinator ?[phone] ? ?

## 2022-02-25 LAB — CBC
HCT: 28 % — ABNORMAL LOW (ref 39.0–52.0)
Hemoglobin: 9.2 g/dL — ABNORMAL LOW (ref 13.0–17.0)
MCH: 29.9 pg (ref 26.0–34.0)
MCHC: 32.9 g/dL (ref 30.0–36.0)
MCV: 90.9 fL (ref 80.0–100.0)
Platelets: 340 10*3/uL (ref 150–400)
RBC: 3.08 MIL/uL — ABNORMAL LOW (ref 4.22–5.81)
RDW: 25.8 % — ABNORMAL HIGH (ref 11.5–15.5)
WBC: 11.4 10*3/uL — ABNORMAL HIGH (ref 4.0–10.5)
nRBC: 0.4 % — ABNORMAL HIGH (ref 0.0–0.2)

## 2022-02-25 LAB — GLUCOSE, CAPILLARY
Glucose-Capillary: 129 mg/dL — ABNORMAL HIGH (ref 70–99)
Glucose-Capillary: 142 mg/dL — ABNORMAL HIGH (ref 70–99)
Glucose-Capillary: 153 mg/dL — ABNORMAL HIGH (ref 70–99)
Glucose-Capillary: 153 mg/dL — ABNORMAL HIGH (ref 70–99)
Glucose-Capillary: 170 mg/dL — ABNORMAL HIGH (ref 70–99)
Glucose-Capillary: 185 mg/dL — ABNORMAL HIGH (ref 70–99)
Glucose-Capillary: 93 mg/dL (ref 70–99)

## 2022-02-25 LAB — BASIC METABOLIC PANEL
Anion gap: 7 (ref 5–15)
BUN: 38 mg/dL — ABNORMAL HIGH (ref 8–23)
CO2: 24 mmol/L (ref 22–32)
Calcium: 9 mg/dL (ref 8.9–10.3)
Chloride: 105 mmol/L (ref 98–111)
Creatinine, Ser: 1.55 mg/dL — ABNORMAL HIGH (ref 0.61–1.24)
GFR, Estimated: 46 mL/min — ABNORMAL LOW (ref >60)
Glucose, Bld: 120 mg/dL — ABNORMAL HIGH (ref 70–99)
Potassium: 4.9 mmol/L (ref 3.5–5.1)
Sodium: 136 mmol/L (ref 135–145)

## 2022-02-25 MED ORDER — CARVEDILOL 3.125 MG PO TABS
3.1250 mg | ORAL_TABLET | Freq: Two times a day (BID) | ORAL | Status: DC
  Administered 2022-02-26: 3.125 mg
  Filled 2022-02-25: qty 1

## 2022-02-25 MED ORDER — SODIUM CHLORIDE 0.9 % IV SOLN: INTRAVENOUS | Status: DC

## 2022-02-25 MED ORDER — CHLORHEXIDINE GLUCONATE 0.12 % MT SOLN
OROMUCOSAL | Status: AC
  Administered 2022-02-25: 15 mL via OROMUCOSAL
  Filled 2022-02-25: qty 15

## 2022-02-25 NOTE — Progress Notes (Signed)
[place] ?PROGRESS NOTE ? ? ? ?Vernon [name]  VEL:[id number] DOB: July 01, 1946 DOA: 02/10/2022 ?PCP: Baxter Hire, MD  ? ? ?No chief complaint on file. ? ? ?Brief Narrative:  ?76 yo with hx atrial fibrillation on eliquis, T2DM, anemia, GERD, aortic stenosis, MDS and multiple other medical problems presented with weakness and nausea, found to have a cerebellar hemorrhage.  He required intubation for airway protection and underwent emergent craniotomy on 3/18 with hematoma evacuation and drain placement.   ? ?Significant Events ?3/18 admission, intubation for airway protection after vomiting, underwent Suboccipital craniectomy and evacuation of cerebellar hemorrhage.  Placement of right parieto-occipital ventriculostomy by Dr. Ellene Route ?3/19 repeat CT head > s/p left suboccipital craniectomy and cerebellar hematoma evacuation with moderate residual volume of posterior fossa blood, improved patency of 4th ventricle, decreased lateral and 3rd ventricle size; EVD in place, otherwise stable CT ?3/20  MRI Brain>>Left suboccipital craniectomy for hematoma evacuation. Roughly 13 mL residual left cerebellar blood tracking across midline to the right. Cerebellar edema with partial effacement of the 4th ventricle. But basilar cistern patency is adequate, with no ventriculomegaly. Sequelae of attempted EVD placement across the splenium of the corpus callosum. Trace intraventricular and subarachnoid hemorrhage. Background mild to moderate nonspecific cerebral white matter signal changes. 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 ?3/21: patient on PSV; thoracentesis for R pleural effusion; off pressors; sedation switched to cleviprex; hypertonic saline stopped due to hypernatremia; started on unasyn, trach asp sent ?3/22: pulled EVD out; NSG aware and will just continue to monitor; extubated ?3/23: remains on precedex; off levo, transfused 1 unit PRBC, repeat CTH stable ?3/27 TRH now  primary ?Worsening mental status and weakness today, head CT pending ?  ? ? ?Assessment & Plan: ? Principal Problem: ?  ICH (intracerebral hemorrhage) ([place]) ?Active Problems: ?  Acute metabolic encephalopathy ?  Cerebral edema (HCC) ?  Obstructive hydrocephalus ([place]) ?  Dysphagia ?  Pleural effusion on right ?  Respiratory failure requiring intubation ([place]) ?  Cough ?  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[place]) ?  Chronic atrial fibrillation (HCC) ?  AKI (acute kidney injury) ([place]) ?  Essential hypertension ?  Type 2 diabetes mellitus with hyperglycemia (HCC) ?  Dyslipidemia ?  Hyperkalemia ?  Anemia ? ? ? ?Assessment and Plan: ?* ICH (intracerebral hemorrhage) ([place]) ?Presented to [facility] 3/18 with weakness and nausea, found to have cerebellar hemorrhage. 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 and HTN ?Head CT 3/18 with acute 2.2 cm intraparencymal hemorrhage in the L middle cerebellar peduncle with probable small volume adjacent extra axial extension, surrounding edema with mild effacement of 4th ventricle, no hydrocephalus ?CTA head/neck 3/18 without LVO, no evidence of vascular malformation or aneurysm in region of acute hemorrhage -> consider f/u after resolution of hemorrhage -- nondominant right verterbral artery origin stenosis, mild atherosclerosis ?CT 3/18 with increased size of intraparenchymal hematoma, increased subdural blood ?3/19 head CT s/p L suboccipital craniectomy and cerebellar hematoma evaculation with a moderate residual volume of posterior fossa blood, but improved patency of the 4th ventricle and decreased lateral and 3rd bentricle size, sequelae of temporary R parieto occipital EVD placement ?3/20 MRI brain L suboccipital craniectomy for hematoma evacuation, 13 ml residual L cerebellar blood tracking across midline to R, cerebellar edema with partial effacement of the 4th ventricle - sequelae of attempted EVD placement across splenium of corpus collosum ?3/20 head CT with no  unexpected or new unfavorable finding ?3/23 head CT with no  interval progression of cerebellar edema and hemorrhage ?3/26 head CT with decreased size and conspicuity of L superior cerebellar hemorrhage with associated edema, with slightly decreased mass effect on the 4th ventricle, redemonstrated hemorrahge in the R occipital ventriculostomy tract, slightly decreased density of hemorrhage in parietal ventriculostomy tract  ?Repeat head CT 3/28 with worsening mental status, worsening weakness (LUE flaccid) ?Discussed with neuro who evaluated the patient, and is noted noted unfortunately patient with neurological worsening however no reversible etiology noted on work-up as infectious work-up done negative. ?It is noted by neurology that patient had recurrent risk for embolic strokes from A-fib as he is off anticoagulation however unsafe to start anticoagulation at this time and may need to make a decision about resuming anticoagulation alternative treatment like Watchman device in about a month from now when patient has recovered from his acute stroke. ?-PT/OT/SLP. ?-Being assessed for inpatient rehab admission. ?-Per RN patient seen by inpatient rehab MD and patient noted to be agitated on 02/24/2022 and as such admission to inpatient rehab on hold. ?No antithrombotic 2/2 ICH ?LDL 25, no statin, A1c 6.2 ?Echo EF 55-60%, no RWMA ?-Zyprexa 2.5 mg twice daily for agitation started 02/24/2022 and uptitrate as needed. ?Will need outpatient follow-up with neurology and neurosurgery postdischarge from inpatient rehab. ? ? ?Acute metabolic encephalopathy ?-Secondary to above.  ?Repeat head CT done 02/20/2022 with no evidence of interval acute intracranial abnormality, continued evolution of postop changes from prior left suboccipital craniectomy and left posterior fossa hematoma evacuation.  Residual hemorrhage within the left cerebellar hemisphere similar to slightly decreased from prior head CT of 02/18/2022.  Unchanged left  cerebellar edema.  Stable mass effect with partial effacement of fourth ventricle.  No evidence of hydrocephalus. ?-Chest x-ray done unremarkable. ?-Nitrite negative leukocytes negative, 0-5 WBCs. ?-VBG with a pH of 7.42, PCO2 of 43, PO2 of 51. ?-Neurology following. ?-Status post gentle hydration. ?-Seroquel was changed to as needed and subsequently discontinued. ?Afebrile, wbc wnl ?Delirium precautions ?-Patient noted to be agitated 02/24/2022 and per RN received some Seroquel overnight with no significant improvement. ?-Zyprexa 2.5 mg twice daily and uptitrate as needed for better control of agitation. ?-Patient with waxing and waning mental status, consult palliative care for goals of care ? ?Obstructive hydrocephalus ([place]) ?3/19 suboccipital craniectomy and evacuation of cerebellar hemorrhage, placement of R parietooccipital ventriculostomy ?EVD pulled out 3/22 ?keppra post op x14 days (ends 02/24/22) ?NSGY recommending continued supportive care, PT/OT/SLP, dispo planning ? ? ?Cerebral edema (HCC) ?S/p hypertonic saline ?Now off, Na trending down and currently at 136 and seems to be stabilizing. ? ?Dysphagia ?cortrak in placed. ?-Patient seen by SLP and started on dysphagia 2 diet with nectar thick liquids and to hold trays if patient remains lethargic early on. ?-Patient made n.p.o. for now per speech therapy. ?-Continue tube feeds for now due to fluctuating mental status. ?-Gentle hydration for the next 1 to 2 days. ? ?Cough ?Remains on RA, worsening cough with AMS noted on 02/20/2022. ?-Repeat chest x-ray no acute abnormalities. ?-Symptomatic treatment. ? ?Respiratory failure requiring intubation ([place]) ?Now extubated on RA, follow CXR 3/28 - no acute cardiopulm abnormality ?S/p unasyn for concern for aspiration pneumonia. ? ?Pleural effusion on right ?CXR 2/24 with small symmetric effusions. ?S/p thoracentesis, consistent with transudate ?Cytology with no malignant cells ? ?AKI (acute kidney injury)  ([place]) ?Improved, follow ?-Gentle hydration with IV fluids for the next 1 to 2 days. ? ?Chronic atrial fibrillation ([place]) ?Not on any antithrombotics at this time given ICH ?Will need  to discuss going forward, per neuro, may ne

## 2022-02-25 NOTE — Progress Notes (Signed)
?   02/25/22 0802  ?Assess: MEWS Score  ?Temp 98.7 ?F (37.1 ?C)  ?BP (!) 90/54  ?Pulse Rate (!) 50  ?ECG Heart Rate (!) 56  ?Resp 18  ?Level of Consciousness Responds to Voice  ?SpO2 93 %  ?O2 Device Room Air  ?Assess: MEWS Score  ?MEWS Temp 0  ?MEWS Systolic 1  ?MEWS Pulse 0  ?MEWS RR 0  ?MEWS LOC 1  ?MEWS Score 2  ?MEWS Score Color Yellow  ?Assess: if the MEWS score is Yellow or Red  ?Were vital signs taken at a resting state? Yes  ?Focused Assessment No change from prior assessment  ?Early Detection of Sepsis Score *See Row Information* Low  ?MEWS guidelines implemented *See Row Information* Yes  ?Treat  ?MEWS Interventions Other (Comment) ?(hold AM BP meds)  ?Take Vital Signs  ?Increase Vital Sign Frequency  Yellow: Q 2hr X 2 then Q 4hr X 2, if remains yellow, continue Q 4hrs  ? ? ?

## 2022-02-26 ENCOUNTER — Inpatient Hospital Stay (HOSPITAL_COMMUNITY)
Admission: RE | Admit: 2022-02-26 | Discharge: 2022-03-20 | DRG: 056 | Disposition: A | Discharge status: Home Health | Payer: Medicare Other | Source: Intra-hospital | Attending: Physical Medicine & Rehabilitation | Admitting: Physical Medicine & Rehabilitation

## 2022-02-26 DIAGNOSIS — I482 Chronic atrial fibrillation, unspecified: Secondary | ICD-10-CM | POA: Diagnosis present

## 2022-02-26 DIAGNOSIS — R1313 Dysphagia, pharyngeal phase: Secondary | ICD-10-CM | POA: Diagnosis present

## 2022-02-26 DIAGNOSIS — Z79899 Other long term (current) drug therapy: Secondary | ICD-10-CM | POA: Diagnosis not present

## 2022-02-26 DIAGNOSIS — I129 Hypertensive chronic kidney disease with stage 1 through stage 4 chronic kidney disease, or unspecified chronic kidney disease: Secondary | ICD-10-CM | POA: Diagnosis present

## 2022-02-26 DIAGNOSIS — Z87891 Personal history of nicotine dependence: Secondary | ICD-10-CM

## 2022-02-26 DIAGNOSIS — G911 Obstructive hydrocephalus: Secondary | ICD-10-CM | POA: Diagnosis present

## 2022-02-26 DIAGNOSIS — E1165 Type 2 diabetes mellitus with hyperglycemia: Secondary | ICD-10-CM

## 2022-02-26 DIAGNOSIS — E785 Hyperlipidemia, unspecified: Secondary | ICD-10-CM | POA: Diagnosis present

## 2022-02-26 DIAGNOSIS — Z741 Need for assistance with personal care: Secondary | ICD-10-CM | POA: Diagnosis present

## 2022-02-26 DIAGNOSIS — Z982 Presence of cerebrospinal fluid drainage device: Secondary | ICD-10-CM

## 2022-02-26 DIAGNOSIS — R1312 Dysphagia, oropharyngeal phase: Secondary | ICD-10-CM | POA: Diagnosis present

## 2022-02-26 DIAGNOSIS — D469 Myelodysplastic syndrome, unspecified: Secondary | ICD-10-CM | POA: Diagnosis present

## 2022-02-26 DIAGNOSIS — I10 Essential (primary) hypertension: Secondary | ICD-10-CM

## 2022-02-26 DIAGNOSIS — Z7901 Long term (current) use of anticoagulants: Secondary | ICD-10-CM

## 2022-02-26 DIAGNOSIS — I69191 Dysphagia following nontraumatic intracerebral hemorrhage: Principal | ICD-10-CM

## 2022-02-26 DIAGNOSIS — I619 Nontraumatic intracerebral hemorrhage, unspecified: Secondary | ICD-10-CM | POA: Diagnosis not present

## 2022-02-26 DIAGNOSIS — Z515 Encounter for palliative care: Secondary | ICD-10-CM | POA: Diagnosis not present

## 2022-02-26 DIAGNOSIS — G9340 Encephalopathy, unspecified: Secondary | ICD-10-CM | POA: Diagnosis present

## 2022-02-26 DIAGNOSIS — E782 Mixed hyperlipidemia: Secondary | ICD-10-CM | POA: Diagnosis present

## 2022-02-26 DIAGNOSIS — E1122 Type 2 diabetes mellitus with diabetic chronic kidney disease: Secondary | ICD-10-CM | POA: Diagnosis present

## 2022-02-26 DIAGNOSIS — I4891 Unspecified atrial fibrillation: Secondary | ICD-10-CM | POA: Diagnosis present

## 2022-02-26 DIAGNOSIS — N179 Acute kidney failure, unspecified: Secondary | ICD-10-CM | POA: Diagnosis present

## 2022-02-26 DIAGNOSIS — N1831 Chronic kidney disease, stage 3a: Secondary | ICD-10-CM | POA: Diagnosis present

## 2022-02-26 DIAGNOSIS — G936 Cerebral edema: Secondary | ICD-10-CM | POA: Diagnosis present

## 2022-02-26 DIAGNOSIS — D631 Anemia in chronic kidney disease: Secondary | ICD-10-CM | POA: Diagnosis present

## 2022-02-26 DIAGNOSIS — Z794 Long term (current) use of insulin: Secondary | ICD-10-CM

## 2022-02-26 DIAGNOSIS — I4819 Other persistent atrial fibrillation: Secondary | ICD-10-CM

## 2022-02-26 DIAGNOSIS — Z66 Do not resuscitate: Secondary | ICD-10-CM | POA: Diagnosis present

## 2022-02-26 DIAGNOSIS — J90 Pleural effusion, not elsewhere classified: Secondary | ICD-10-CM | POA: Diagnosis present

## 2022-02-26 DIAGNOSIS — Z7189 Other specified counseling: Secondary | ICD-10-CM

## 2022-02-26 DIAGNOSIS — K219 Gastro-esophageal reflux disease without esophagitis: Secondary | ICD-10-CM | POA: Diagnosis present

## 2022-02-26 DIAGNOSIS — I69391 Dysphagia following cerebral infarction: Secondary | ICD-10-CM | POA: Diagnosis not present

## 2022-02-26 DIAGNOSIS — G935 Compression of brain: Secondary | ICD-10-CM | POA: Diagnosis present

## 2022-02-26 DIAGNOSIS — I69193 Ataxia following nontraumatic intracerebral hemorrhage: Secondary | ICD-10-CM

## 2022-02-26 DIAGNOSIS — R32 Unspecified urinary incontinence: Secondary | ICD-10-CM | POA: Diagnosis present

## 2022-02-26 DIAGNOSIS — J9690 Respiratory failure, unspecified, unspecified whether with hypoxia or hypercapnia: Secondary | ICD-10-CM

## 2022-02-26 DIAGNOSIS — N390 Urinary tract infection, site not specified: Secondary | ICD-10-CM | POA: Diagnosis not present

## 2022-02-26 DIAGNOSIS — R059 Cough, unspecified: Secondary | ICD-10-CM

## 2022-02-26 DIAGNOSIS — N1832 Chronic kidney disease, stage 3b: Secondary | ICD-10-CM | POA: Diagnosis not present

## 2022-02-26 DIAGNOSIS — I350 Nonrheumatic aortic (valve) stenosis: Secondary | ICD-10-CM | POA: Diagnosis present

## 2022-02-26 DIAGNOSIS — Z79890 Hormone replacement therapy (postmenopausal): Secondary | ICD-10-CM

## 2022-02-26 DIAGNOSIS — R001 Bradycardia, unspecified: Secondary | ICD-10-CM | POA: Diagnosis not present

## 2022-02-26 DIAGNOSIS — B9620 Unspecified Escherichia coli [E. coli] as the cause of diseases classified elsewhere: Secondary | ICD-10-CM | POA: Diagnosis not present

## 2022-02-26 DIAGNOSIS — B370 Candidal stomatitis: Secondary | ICD-10-CM | POA: Diagnosis not present

## 2022-02-26 DIAGNOSIS — Z96611 Presence of right artificial shoulder joint: Secondary | ICD-10-CM | POA: Diagnosis present

## 2022-02-26 LAB — CBC WITH DIFFERENTIAL/PLATELET
Abs Immature Granulocytes: 0.05 10*3/uL (ref 0.00–0.07)
Basophils Absolute: 0 10*3/uL (ref 0.0–0.1)
Basophils Relative: 0 %
Eosinophils Absolute: 0.2 10*3/uL (ref 0.0–0.5)
Eosinophils Relative: 2 %
HCT: 26.7 % — ABNORMAL LOW (ref 39.0–52.0)
Hemoglobin: 8.7 g/dL — ABNORMAL LOW (ref 13.0–17.0)
Immature Granulocytes: 0 %
Lymphocytes Relative: 11 %
Lymphs Abs: 1.3 10*3/uL (ref 0.7–4.0)
MCH: 30 pg (ref 26.0–34.0)
MCHC: 32.6 g/dL (ref 30.0–36.0)
MCV: 92.1 fL (ref 80.0–100.0)
Monocytes Absolute: 1 10*3/uL (ref 0.1–1.0)
Monocytes Relative: 8 %
Neutro Abs: 9.2 10*3/uL — ABNORMAL HIGH (ref 1.7–7.7)
Neutrophils Relative %: 79 %
Platelets: 359 10*3/uL (ref 150–400)
RBC: 2.9 MIL/uL — ABNORMAL LOW (ref 4.22–5.81)
RDW: 25.8 % — ABNORMAL HIGH (ref 11.5–15.5)
WBC: 11.7 10*3/uL — ABNORMAL HIGH (ref 4.0–10.5)
nRBC: 0.3 % — ABNORMAL HIGH (ref 0.0–0.2)

## 2022-02-26 LAB — GLUCOSE, CAPILLARY
Glucose-Capillary: 124 mg/dL — ABNORMAL HIGH (ref 70–99)
Glucose-Capillary: 155 mg/dL — ABNORMAL HIGH (ref 70–99)
Glucose-Capillary: 143 mg/dL — ABNORMAL HIGH (ref 70–99)
Glucose-Capillary: 160 mg/dL — ABNORMAL HIGH (ref 70–99)
Glucose-Capillary: 99 mg/dL (ref 70–99)
Glucose-Capillary: 178 mg/dL — ABNORMAL HIGH (ref 70–99)

## 2022-02-26 LAB — BASIC METABOLIC PANEL
Anion gap: 7 (ref 5–15)
BUN: 41 mg/dL — ABNORMAL HIGH (ref 8–23)
CO2: 23 mmol/L (ref 22–32)
Calcium: 8.5 mg/dL — ABNORMAL LOW (ref 8.9–10.3)
Chloride: 104 mmol/L (ref 98–111)
Creatinine, Ser: 1.53 mg/dL — ABNORMAL HIGH (ref 0.61–1.24)
GFR, Estimated: 47 mL/min — ABNORMAL LOW (ref >60)
Glucose, Bld: 133 mg/dL — ABNORMAL HIGH (ref 70–99)
Potassium: 4.7 mmol/L (ref 3.5–5.1)
Sodium: 134 mmol/L — ABNORMAL LOW (ref 135–145)

## 2022-02-26 MED ORDER — POLYETHYLENE GLYCOL 3350 17 G PO PACK
17.0000 g | PACK | Freq: Every day | ORAL | Status: DC
  Administered 2022-02-27 – 2022-03-13 (×14): 17 g
  Filled 2022-02-26 (×15): qty 1

## 2022-02-26 MED ORDER — FREE WATER
200.0000 mL | Status: DC
  Administered 2022-02-26 – 2022-03-12 (×82): 200 mL

## 2022-02-26 MED ORDER — AMANTADINE HCL 50 MG/5ML PO SOLN
100.0000 mg | Freq: Two times a day (BID) | ORAL | Status: DC
  Administered 2022-02-26 – 2022-03-12 (×28): 100 mg
  Filled 2022-02-26 (×28): qty 10

## 2022-02-26 MED ORDER — HEPARIN SODIUM (PORCINE) 5000 UNIT/ML IJ SOLN: 5000.0000 [IU] | Freq: Three times a day (TID) | INTRAMUSCULAR | Status: DC

## 2022-02-26 MED ORDER — CARVEDILOL 3.125 MG PO TABS
3.1250 mg | ORAL_TABLET | Freq: Two times a day (BID) | ORAL | Status: DC
  Administered 2022-02-27 – 2022-03-13 (×28): 3.125 mg
  Filled 2022-02-26 (×28): qty 1

## 2022-02-26 MED ORDER — ACETAMINOPHEN 325 MG PO TABS
650.0000 mg | ORAL_TABLET | Freq: Four times a day (QID) | ORAL | Status: DC | PRN
  Administered 2022-03-04 (×2): 650 mg via ORAL
  Filled 2022-02-26 (×2): qty 2

## 2022-02-26 MED ORDER — HEPARIN SODIUM (PORCINE) 5000 UNIT/ML IJ SOLN
5000.0000 [IU] | Freq: Three times a day (TID) | INTRAMUSCULAR | Status: DC
  Administered 2022-02-26 – 2022-03-20 (×64): 5000 [IU] via SUBCUTANEOUS
  Filled 2022-02-26 (×65): qty 1

## 2022-02-26 MED ORDER — PROSOURCE TF PO LIQD
45.0000 mL | Freq: Two times a day (BID) | ORAL | Status: DC
  Administered 2022-02-26 – 2022-03-13 (×30): 45 mL
  Filled 2022-02-26 (×30): qty 45

## 2022-02-26 MED ORDER — OLANZAPINE 5 MG PO TBDP
2.5000 mg | ORAL_TABLET | ORAL | Status: DC
Start: 2022-02-27 — End: 2022-03-07
  Administered 2022-02-27 – 2022-03-07 (×17): 2.5 mg via ORAL
  Filled 2022-02-26 (×17): qty 0.5

## 2022-02-26 MED ORDER — OSMOLITE 1.5 CAL PO LIQD
1000.0000 mL | ORAL | Status: DC
  Administered 2022-02-26: 1000 mL
  Filled 2022-02-26: qty 1000

## 2022-02-26 MED ORDER — PANTOPRAZOLE 2 MG/ML SUSPENSION
40.0000 mg | Freq: Every day | ORAL | Status: DC
  Administered 2022-02-27 – 2022-03-13 (×15): 40 mg
  Filled 2022-02-26 (×8): qty 20

## 2022-02-26 MED ORDER — BISACODYL 10 MG RE SUPP: 10.0000 mg | Freq: Every day | RECTAL | Status: DC | PRN

## 2022-02-26 MED ORDER — ALBUTEROL SULFATE (2.5 MG/3ML) 0.083% IN NEBU: 2.5000 mg | INHALATION_SOLUTION | RESPIRATORY_TRACT | Status: DC | PRN

## 2022-02-26 MED ORDER — INSULIN ASPART 100 UNIT/ML IJ SOLN
0.0000 [IU] | INTRAMUSCULAR | Status: DC
  Administered 2022-02-26 – 2022-02-27 (×6): 3 [IU] via SUBCUTANEOUS
  Administered 2022-02-27: 2 [IU] via SUBCUTANEOUS
  Administered 2022-02-28 (×2): 3 [IU] via SUBCUTANEOUS
  Administered 2022-02-28 – 2022-03-01 (×3): 5 [IU] via SUBCUTANEOUS
  Administered 2022-03-01 (×3): 3 [IU] via SUBCUTANEOUS
  Administered 2022-03-01: 2 [IU] via SUBCUTANEOUS
  Administered 2022-03-01: 3 [IU] via SUBCUTANEOUS
  Administered 2022-03-02: 2 [IU] via SUBCUTANEOUS
  Administered 2022-03-02 (×3): 5 [IU] via SUBCUTANEOUS
  Administered 2022-03-02 – 2022-03-03 (×3): 2 [IU] via SUBCUTANEOUS
  Administered 2022-03-03 (×3): 3 [IU] via SUBCUTANEOUS
  Administered 2022-03-03: 5 [IU] via SUBCUTANEOUS
  Administered 2022-03-04: 3 [IU] via SUBCUTANEOUS
  Administered 2022-03-04: 5 [IU] via SUBCUTANEOUS
  Administered 2022-03-04 (×2): 3 [IU] via SUBCUTANEOUS
  Administered 2022-03-04: 5 [IU] via SUBCUTANEOUS
  Administered 2022-03-04: 2 [IU] via SUBCUTANEOUS
  Administered 2022-03-04 – 2022-03-05 (×3): 3 [IU] via SUBCUTANEOUS
  Administered 2022-03-05: 2 [IU] via SUBCUTANEOUS
  Administered 2022-03-05: 5 [IU] via SUBCUTANEOUS
  Administered 2022-03-06 (×4): 3 [IU] via SUBCUTANEOUS
  Administered 2022-03-06: 2 [IU] via SUBCUTANEOUS
  Administered 2022-03-06 – 2022-03-07 (×4): 3 [IU] via SUBCUTANEOUS
  Administered 2022-03-07: 2 [IU] via SUBCUTANEOUS
  Administered 2022-03-07 – 2022-03-08 (×2): 3 [IU] via SUBCUTANEOUS
  Administered 2022-03-08 (×2): 2 [IU] via SUBCUTANEOUS
  Administered 2022-03-09 (×2): 3 [IU] via SUBCUTANEOUS
  Administered 2022-03-09 (×2): 2 [IU] via SUBCUTANEOUS
  Administered 2022-03-09 – 2022-03-10 (×6): 3 [IU] via SUBCUTANEOUS
  Administered 2022-03-11: 2 [IU] via SUBCUTANEOUS
  Administered 2022-03-11 (×4): 3 [IU] via SUBCUTANEOUS
  Administered 2022-03-11 – 2022-03-12 (×3): 2 [IU] via SUBCUTANEOUS
  Administered 2022-03-12 (×2): 3 [IU] via SUBCUTANEOUS
  Administered 2022-03-13 (×4): 2 [IU] via SUBCUTANEOUS

## 2022-02-26 MED ORDER — NYSTATIN 100000 UNIT/ML MT SUSP
5.0000 mL | Freq: Four times a day (QID) | OROMUCOSAL | Status: DC
  Administered 2022-02-26: 500000 [IU] via ORAL
  Filled 2022-02-26: qty 5

## 2022-02-26 MED ORDER — INSULIN ASPART 100 UNIT/ML IJ SOLN
2.0000 [IU] | INTRAMUSCULAR | Status: DC
  Administered 2022-02-26 – 2022-03-12 (×77): 2 [IU] via SUBCUTANEOUS

## 2022-02-26 MED ORDER — INSULIN GLARGINE-YFGN 100 UNIT/ML ~~LOC~~ SOLN
30.0000 [IU] | Freq: Every day | SUBCUTANEOUS | Status: DC
  Administered 2022-02-27 – 2022-03-01 (×3): 30 [IU] via SUBCUTANEOUS
  Filled 2022-02-26 (×3): qty 0.3

## 2022-02-26 MED ORDER — SENNOSIDES-DOCUSATE SODIUM 8.6-50 MG PO TABS
1.0000 | ORAL_TABLET | Freq: Two times a day (BID) | ORAL | Status: DC
  Administered 2022-02-26 – 2022-03-13 (×27): 1
  Filled 2022-02-26 (×28): qty 1

## 2022-02-26 NOTE — Progress Notes (Signed)
Inpatient Rehab Admissions Coordinator:  ? ?Plan to admit to CIR today. Dr. Grandville Silos in agreement.  Pt/Family and TOC team aware.   ? ?Shann Medal, PT, DPT ?Admissions Coordinator ?[phone] ?02/26/22  ?11:56 AM ? ?

## 2022-02-26 NOTE — TOC Transition Note (Signed)
Transition of Care (TOC) - CM/SW Discharge Note ? ? ?Patient Details  ?Name: Vernon [name] ?MRN: [id number] ?Date of Birth: 1946/11/13 ? ?Transition of Care (TOC) CM/SW Contact:  ?Pollie Friar, RN ?Phone Number: ?02/26/2022, 12:05 PM ? ? ?Clinical Narrative:    ?Patient discharging to CIR today. CM signing off.  ? ? ?Final next level of care: [place] ?Barriers to Discharge: No Barriers Identified ? ? ?Patient Goals and CMS Choice ?  ?  ?Choice offered to / list presented to : Patient ? ?Discharge Placement ?  ?           ?  ?  ?  ?  ? ?Discharge Plan and Services ?  ?  ?           ?  ?  ?  ?  ?  ?  ?  ?  ?  ?  ? ?Social Determinants of Health (SDOH) Interventions ?  ? ? ?Readmission Risk Interventions ?   ? View : No data to display.  ?  ?  ?  ? ? ? ? ? ?

## 2022-02-26 NOTE — Progress Notes (Signed)
Pt arrived to unit via bed at 4:15 p.m. Patient A&O x 1-2. Pt denies any pain, SOB or chest pain. Pt family reports impulsiveness at times, discussed safety options. Spoke with family about changing CODE status, will discuss with MD in AM. Pt oriented to unit and expresses understanding, needs met at this time.  ?

## 2022-02-26 NOTE — Progress Notes (Addendum)
Inpatient Rehabilitation Admission Medication Review by a Pharmacist ? ?A complete drug regimen review was completed for this patient to identify any potential clinically significant medication issues. ? ?High Risk Drug Classes Is patient taking? Indication by Medication  ?Antipsychotic Yes Zyprexa for agitation  ?Anticoagulant Yes Sq heparin for VTE ppx  ?Antibiotic No   ?Opioid No   ?Antiplatelet No   ?Hypoglycemics/insulin Yes Semglee, SSI insulin for DM  ?Vasoactive Medication Yes Carvedilol for BP  ?Chemotherapy No   ?Other Yes Amantadine for attention ?Protonix for GERD  ? ? ? ?Type of Medication Issue Identified Description of Issue Recommendation(s)  ?Drug Interaction(s) (clinically significant) ?    ?Duplicate Therapy ?    ?Allergy ?    ?No Medication Administration End Date ?    ?Incorrect Dose ?    ?Additional Drug Therapy Needed ?    ?Significant med changes from prior encounter (inform family/care partners about these prior to discharge).    ?Other ? Allopruinol - resume if and when appropriate ?Metformin for DM->transitioned to insulin   ? ? ?Clinically significant medication issues were identified that warrant physician communication and completion of prescribed/recommended actions by midnight of the next day:  No ? ?Pharmacist comments: No plans to resume apixaban with ICH, Zocor stopped for low LDL ? ?Time spent performing this drug regimen review (minutes):  20 minutes ? ? ?Tad Moore ?02/26/22 1 pm ?

## 2022-02-26 NOTE — Progress Notes (Addendum)
PMR Admission Coordinator Pre-Admission Assessment ?  ?Patient: Vernon [name] is an 76 y.o., male ?MRN: [id number] ?DOB: 05-25-1946 ?Height: 5\' 9"  (175.3 cm) ?Weight: 92.2 kg ?  ?Insurance Information ?HMO: yes    PPO:      PCP:      IPA:      80/20:      OTHER:  ?PRIMARY: [employer] Medicare      Policy#: [id number]      Subscriber: pt ?CM Name:       Phone#: [phone]     Fax#: [phone] ?Pre-Cert#: [id number] approval for 7 days from 4/1 with updates due to fax listed above on 4/7      Employer:  ?Benefits:  Phone #: [phone]     Name: n/a ?Eff. Date: 11/26/21     Deduct: $0      Out of Pocket Max: $4500 (met $321.74)      Life Max: n/a ?CIR: $325/day for days 1-5      SNF: 20 full days ?Outpatient:      Co-Pay: $20/visit ?Home Health: 100%      Co-Pay:  ?DME: 80%     Co-ins: 20% ?Providers:  ?SECONDARY:       Policy#:      Phone#:  ?  ?Financial Counselor:       Phone#:  ?  ?The ?Data Collection Information Summary? for patients in Inpatient Rehabilitation Facilities with attached ?Privacy Act [place] Records? was provided and verbally reviewed with: Patient and Family ?  ?Emergency Contact Information ?Contact Information   ?  ?  Name Relation Home Work Mobile  ?  [place]   [phone]  ?  SUPREME, RYBARCZYK Son     [phone]  ?  ?   ?  ?  ?Current Medical History  ?Patient Admitting Diagnosis: L cerebellar ICH s/p crani evacuation ?  ?History of Present Illness: Vernon [name] is a 76 year old right-handed male with history of chronic anemia, diabetes mellitus, hyperlipidemia, hypertension, atrial fibrillation maintained on Eliquis.  Per chart review patient lives with spouse.  Able to stay on main level with bed and bath.  Independent driving prior to admission and retired.  Wife works full-time.  Presented 02/10/2022 with left-sided weakness and incoordination as well as nausea vomiting.  CT of the head showed acute 2.2 cm intraparenchymal hemorrhage in the left middle  cerebellar peduncle and probable small volume adjacent extra-axial extension.  Surrounding edema with mild effacement of the fourth ventricle.   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  Mild atherosclerosis without greater than 50% stenosis.  Eliquis was reversed with [name].  Patient underwent suboccipital craniectomy and evacuation of cerebellar hemorrhage.  Placement of right parieto-occipital ventriculostomy 02/11/2022 per Dr. Ellene Route.  Maintained on Keppra 14 days postoperatively ending 02/24/2022.  Follow-up CTs/MRI no hydrocephalus.  EVD was pulled 3/22.  Patient did require short-term intubation through 02/14/2022.  Postoperative course finding the left-sided pleural effusion and moderate to large right side pleural effusion and underwent thoracentesis with 1250 cc yield.  Latest chest x-ray showing no active disease.  Patient currently remains n.p.o. with nutritional tube feeds.  He was cleared to begin subcutaneous heparin for DVT prophylaxis 02/12/2022.  Neurology notes considering resuming AC at 4 weeks.  Bouts of restlessness and agitation with Seroquel added.  Therapy evaluations completed due to patient decreased functional mobility left side weakness was recommended for a comprehensive rehab program. ?  ?Complete NIHSS TOTAL: 9 ?  ?Patient's  medical record from Vernon [name] has been reviewed by the rehabilitation admission coordinator and physician. ?  ?Past Medical History  ?    ?Past Medical History:  ?Diagnosis Date  ? Anemia    ? Aortic stenosis    ? Arthritis    ? Complication of anesthesia    ?  hard time getting bp up after knee replacement  ? Diabetes mellitus without complication ([place])    ? GERD (gastroesophageal reflux disease)    ?  occ tums prn  ? History of hiatal hernia    ? Hypertension    ? 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[place])    ?  ?  ?Has the patient had major surgery during 100 days prior to admission? Yes ?  ?Family History   ?family  history includes Cancer in his maternal uncle. ?  ?Current Medications ?  ?Current Facility-Administered Medications:  ?  0.9 %  sodium chloride infusion, , Intravenous, Continuous, Eugenie Filler, MD, Last Rate: 100 mL/hr at 02/25/22 2249, New Bag at 02/25/22 2249 ?  acetaminophen (TYLENOL) tablet 650 mg, 650 mg, Oral, Q6H PRN, Tacey Ruiz, MD, 650 mg at 02/24/22 0854 ?  albuterol (PROVENTIL) (2.5 MG/3ML) 0.083% nebulizer solution 2.5 mg, 2.5 mg, Nebulization, Q4H PRN, Simonne Maffucci B, MD ?  amantadine (SYMMETREL) 50 MG/5ML solution 100 mg, 100 mg, Per Tube, BID, Eugenie Filler, MD, 100 mg at 02/26/22 6759 ?  bisacodyl (DULCOLAX) suppository 10 mg, 10 mg, Rectal, Daily PRN, Kristeen Miss, MD, 10 mg at 02/14/22 1139 ?  carvedilol (COREG) tablet 3.125 mg, 3.125 mg, Per Tube, BID WC, Eugenie Filler, MD, 3.125 mg at 02/26/22 1638 ?  chlorhexidine gluconate (MEDLINE KIT) (PERIDEX) 0.12 % solution 15 mL, 15 mL, Mouth Rinse, BID, Kristeen Miss, MD, 15 mL at 02/26/22 0835 ?  feeding supplement (OSMOLITE 1.5 CAL) liquid 1,000 mL, 1,000 mL, Per Tube, Continuous, Eugenie Filler, MD, Last Rate: 60 mL/hr at 02/25/22 2118, 1,000 mL at 02/25/22 2118 ?  feeding supplement (PROSource TF) liquid 45 mL, 45 mL, Per Tube, BID, Eugenie Filler, MD, 45 mL at 02/26/22 0835 ?  free water 200 mL, 200 mL, Per Tube, Q4H, Eugenie Filler, MD, 200 mL at 02/26/22 0835 ?  heparin injection 5,000 Units, 5,000 Units, Subcutaneous, Q8H, Rosalin Hawking, MD, 5,000 Units at 02/26/22 0526 ?  insulin aspart (novoLOG) injection 0-15 Units, 0-15 Units, Subcutaneous, Q4H, Juanito Doom, MD, 2 Units at 02/26/22 [phone] ?  insulin aspart (novoLOG) injection 2 Units, 2 Units, Subcutaneous, Q4H, Jennelle Human B, NP, 2 Units at 02/26/22 [phone] ?  insulin glargine-yfgn (SEMGLEE) injection 30 Units, 30 Units, Subcutaneous, Daily, Simonne Maffucci B, MD, 30 Units at 02/26/22 0834 ?  labetalol (NORMODYNE) injection 10-40 mg, 10-40 mg,  Intravenous, Q10 min PRN, Kristeen Miss, MD, 10 mg at 02/16/22 0534 ?  levETIRAcetam (KEPPRA) 100 MG/ML solution 500 mg, 500 mg, Per Tube, BID, Willette Cluster, RPH, 500 mg at 02/26/22 7017 ?  OLANZapine zydis (ZYPREXA) disintegrating tablet 2.5 mg, 2.5 mg, Oral, 2 times per day, Pham, Minh Q, RPH-CPP, 2.5 mg at 02/26/22 0836 ?  pantoprazole sodium (PROTONIX) 40 mg/20 mL oral suspension 40 mg, 40 mg, Per Tube, Daily, Jacky Kindle, MD, 40 mg at 02/26/22 0837 ?  polyethylene glycol (MIRALAX / GLYCOLAX) packet 17 g, 17 g, Per Tube, Daily, Jacky Kindle, MD, 17 g at 02/26/22 0837 ?  senna-docusate (Senokot-S) tablet 1 tablet, 1 tablet, Per Tube, BID, Kristeen Miss, MD, 1 tablet at  02/26/22 0835 ?  ?Patients Current Diet:  ?Diet Order   ?  ?         ?    Diet - low sodium heart healthy       ?  ?    Diet NPO time specified  Diet effective now       ?  ?  ?   ?  ?  ?   ?  ?  ?Precautions / Restrictions ?Precautions ?Precautions: Fall ?Precaution Comments: [name], bilateral mitts ?Restrictions ?Weight Bearing Restrictions: No  ?  ?Has the patient had 2 or more falls or a fall with injury in the past year? No ?  ?Prior Activity Level ?Community (5-7x/wk): drives, retired, [place] and cares for it ?  ?Prior Functional Level ?Self Care: Did the patient need help bathing, dressing, using the toilet or eating? Independent ?  ?Indoor Mobility: Did the patient need assistance with walking from room to room (with or without device)? Independent ?  ?Stairs: Did the patient need assistance with internal or external stairs (with or without device)? Independent ?  ?Functional Cognition: Did the patient need help planning regular tasks such as shopping or remembering to take medications? Independent ?  ?Patient Information ?Are you of Hispanic, Latino/a,or Spanish origin?: A. No, not of Hispanic, Latino/a, or Spanish origin ?What is your race?: A. White ?Do you need or want an interpreter to communicate with a doctor or  health care staff?: 0. No ?  ?Patient's Response To:  ?Health Literacy and Transportation ?Is the patient able to respond to health literacy and transportation needs?: Yes ?Health Literacy - How often do you need to

## 2022-02-26 NOTE — Discharge Summary (Signed)
Physician Discharge Summary  ?Vernon [name] HFW:[id number] DOB: 06/30/1946 DOA: 02/10/2022 ? ?PCP: Baxter Hire, MD ? ?Admit date: 02/10/2022 ?Discharge date: 02/26/2022 ? ?Time spent: 60 minutes ? ?Recommendations for Outpatient Follow-up:  ?Patient to be discharged to inpatient rehab.  Postdischarge from inpatient rehab will need to follow-up with neurosurgery, Dr. Ellene Route and neurology, Dr. Leonie Man. ? ? ?Discharge Diagnoses:  ?Principal Problem: ?  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  Acute metabolic encephalopathy ?  Cerebral edema (HCC) ?  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  Dysphagia ?  Pleural effusion on right ?  Respiratory failure requiring intubation ([place]) ?  Cough ?  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  Chronic atrial fibrillation (HCC) ?  AKI (acute kidney injury) ([place]) ?  Essential hypertension ?  Type 2 diabetes mellitus with hyperglycemia (HCC) ?  Dyslipidemia ?  Hyperkalemia ?  Anemia ? ? ?Discharge Condition: Stable ? ?Diet recommendation: Currently NPO.  Speech therapy following.  On tube feeds. ? ?Filed Weights  ? 02/20/22 0455 02/25/22 0321 02/26/22 0439  ?Weight: 91.9 kg 91.3 kg 92.2 kg  ? ? ?History of present Illness ?HPI per Dr. Milon Dikes ?Patient came to here from [facility] intubated ?He was admitted by neuro service he has past medical history of hypertension anemia arthritis diabetes hiatal hernia who presented to an outside hospital with vomiting and extremity weakness and had history of A-fib on Eliquis mild dysplastic syndrome start feeling he is having difficulty with moving his left arm and leg he had a CAT scan which showed cerebellar hemorrhage later on he became hypoxic after he vomited and he was intubated for airway protection. ?When he came in here he had another CAT scan which showed increase in the size of cerebellar hemorrhage so he was started on hypertonic saline and neurology is calling neurosurgery. ? ?Hospital Course:  ? 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 ?Patient seen by neurology, L cerebellar ICH 2/2 eliquis use and HTN ?Head CT 3/18 with acute 2.2 cm intraparencymal hemorrhage in the L middle cerebellar peduncle with probable small volume adjacent extra axial extension, surrounding edema with mild effacement of 4th ventricle, no hydrocephalus ?CTA head/neck 3/18 without LVO, no evidence of vascular malformation or aneurysm in region of acute hemorrhage -> consider f/u after resolution of hemorrhage -- nondominant right verterbral artery origin stenosis, mild atherosclerosis ?CT 3/18 with increased size of intraparenchymal hematoma, increased subdural blood ?3/19 head CT s/p L suboccipital craniectomy and cerebellar hematoma evaculation with a moderate residual volume of posterior fossa blood, but improved patency of the 4th ventricle and decreased lateral and 3rd bentricle size, sequelae of temporary R parieto occipital EVD placement ?3/20 MRI brain L suboccipital craniectomy for hematoma evacuation, 13 ml residual L cerebellar blood tracking across midline to R, cerebellar edema with partial effacement of the 4th ventricle - sequelae of attempted EVD placement across splenium of corpus collosum ?3/20 head CT with no unexpected or new unfavorable finding ?3/23 head CT with no interval progression of cerebellar edema and hemorrhage ?3/26 head CT with decreased size and conspicuity of L superior cerebellar hemorrhage with associated edema, with slightly decreased mass effect on the 4th ventricle, redemonstrated hemorrahge in the R occipital ventriculostomy tract, slightly decreased density of hemorrhage in parietal ventriculostomy tract  ?Repeat head CT 3/28 with worsening mental status, worsening weakness (LUE flaccid) ?Discussed with neuro who evaluated the patient, and is noted  noted unfortunately patient with neurological  worsening however no reversible etiology noted on work-up as infectious work-up done negative. ?It is noted by neurology that patient had recurrent risk for embolic strokes from A-fib as he is off anticoagulation however unsafe to start anticoagulation at this time and may need to make a decision about resuming anticoagulation alternative treatment like Watchman device in about a month from now when patient has recovered from his acute stroke. ?-Patient followed by PT/OT/SLP. ?-Being assessed for inpatient rehab admission. ?-Per RN patient seen by inpatient rehab MD and patient noted to be agitated on 02/24/2022 and as such admission to inpatient rehab was held on 02/24/2022. ?No antithrombotic 2/2 ICH ?LDL 25, no statin, A1c 6.2 ?Echo EF 55-60%, no RWMA ?-Zyprexa 2.5 mg twice daily for agitation started 02/24/2022 with improvement with agitation and mentation.  ?-Patient will be discharged to inpatient rehab.  ?Will need outpatient follow-up with neurology and neurosurgery postdischarge from inpatient rehab. ? ? ?Acute metabolic encephalopathy ?-Secondary to above.  ?Repeat head CT done 02/20/2022 with no evidence of interval acute intracranial abnormality, continued evolution of postop changes from prior left suboccipital craniectomy and left posterior fossa hematoma evacuation.  Residual hemorrhage within the left cerebellar hemisphere similar to slightly decreased from prior head CT of 02/18/2022.  Unchanged left cerebellar edema.  Stable mass effect with partial effacement of fourth ventricle.  No evidence of hydrocephalus. ?-Chest x-ray done unremarkable. ?-Nitrite negative leukocytes negative, 0-5 WBCs. ?-VBG with a pH of 7.42, PCO2 of 43, PO2 of 51. ?-Neurology following. ?-Status post gentle hydration. ?-Seroquel was changed to as needed and subsequently discontinued. ?Afebrile, wbc wnl ?Delirium precautions ?-Patient noted to be agitated 02/24/2022 and per RN received some Seroquel overnight with no significant  improvement. ?-Zyprexa 2.5 mg twice daily and uptitrate as needed for better control of agitation. ?-Patient with waxing and waning mental status, with improvement in mental status with Zyprexa which could be further uptitrated.   ?-Patient be discharged to inpatient rehab.   ?-Patient seen by palliative care during this hospitalization and will need outpatient palliative care services postdischarge from inpatient rehab.   ? 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 ?-Will need outpatient follow-up with neurosurgery. ? ? ?Cerebral edema (HCC) ?S/p hypertonic saline ?Now off, Na trending down and stabilized at 134 by day of discharge.   ? ?Dysphagia ?cortrak in placed. ?-Patient seen by SLP and started on dysphagia 2 diet with nectar thick liquids and to hold trays if patient remains lethargic early on. ?-Patient made n.p.o. for per speech therapy in part due to mental status. ?-Patient maintained on tube feeds. ?-Patient will be discharged to inpatient rehab and will be followed by speech therapy there. ? ?Cough ?Remains on RA, worsening cough with AMS noted on 02/20/2022. ?-Repeat chest x-ray no acute abnormalities. ?-Symptomatic treatment. ? ?Respiratory failure requiring intubation ([place]) ?Now extubated on RA, follow CXR 3/28 - no acute cardiopulm abnormality ?S/p unasyn for concern for aspiration pneumonia. ? ?Pleural effusion on right ?CXR 2/24 with small symmetric effusions. ?S/p thoracentesis, consistent with transudate ?Cytology with no malignant cells ? ?AKI (acute kidney injury) ([place]) ?Improved, with gentle hydration. ?-Patient to be discharged to inpatient rehab. ? ?Chronic atrial fibrillation ([place]) ?Not on any antithrombotics at this  time given ICH ?Will need to discuss going  forward, per neuro, may need to make decision regarding resuming anticoagulation or alternative treatment, watchman (after he's recovered in abt a month)? ? ?MDS (my

## 2022-02-26 NOTE — Progress Notes (Signed)
Occupational Therapy Treatment ?Patient Details ?Name: Vernon [name] ?MRN: [id number] ?DOB: 07/29/1946 ?Today's Date: 02/26/2022 ? ? ?History of present illness Pt. is a 76 y.o. male presenting to [facility] on 3/18 with worseing L side coordination along with N/V. CTH demonstrated a 2,2 cm L cerebellar peduncle ICH. He had an epsidoe fo emisis and developed hypoxemia, requiring intubation as well as undergoing an emergent craniotomy on 3/18; hematoma evacuation and drain placement. PMH significant for anemia, arthritis, GERD, DM2, GERD, HTN, afibb on Eliquis. ?  ?OT comments ? Patient in bed, lying on right side. Patient instructed on AAROM and AROM exercises to LUE for shoulder flexion, elbow flexion/extension, and finger flexion/extension. Patient was able to follow instructions for ROM exercises 50% of time with increased movement noted. Patient limited my level of arousal. Patient expected to discharge to [facility] for continued therapy.  ? ?Recommendations for follow up therapy are one component of a multi-disciplinary discharge planning process, led by the attending physician.  Recommendations may be updated based on patient status, additional functional criteria and insurance authorization. ?   ?Follow Up Recommendations ? Acute inpatient rehab (3hours/day)  ?  ?Assistance Recommended at Discharge Frequent or constant Supervision/Assistance  ?Patient can return home with the following ? Two people to help with walking and/or transfers;Two people to help with bathing/dressing/bathroom;Assistance with cooking/housework;Direct supervision/assist for medications management;Direct supervision/assist for financial management;Assist for transportation;Help with stairs or ramp for entrance ?  ?Equipment Recommendations ? Other (comment) (TBD)  ?  ?Recommendations for Other Services   ? ?  ?Precautions / Restrictions Precautions ?Precautions: Fall ?Precaution Comments: cortrak ?Restrictions ?Weight Bearing Restrictions: No  ? ? ?   ? ?Mobility Bed Mobility ?Overal bed mobility: Needs Assistance ?  ?  ?  ?  ?  ?  ?General bed mobility comments: patient seen in sidelying to address LUE AROM ?  ? ?Transfers ?  ?  ?  ?  ?  ?  ?  ?  ?  ?  ?  ?  ?Balance   ?  ?  ?  ?  ?  ?  ?  ?  ?  ?  ?  ?  ?  ?  ?  ?  ?  ?  ?   ? ?ADL either performed or assessed with clinical judgement  ? ?ADL Overall ADL's : Needs assistance/impaired ?  ?  ?Grooming: Wash/dry face;Maximal assistance ?Grooming Details (indicate cue type and reason): Used LUE with difficutly holding wash cloth ?  ?  ?  ?  ?  ?  ?  ?  ?  ?  ?  ?  ?  ?  ?  ?General ADL Comments: attempted washing face witih LUE with difficulty holding wash cloth ?  ? ?Extremity/Trunk Assessment Upper Extremity Assessment ?RUE Deficits / Details: 2-5 grossly to RUE.  Weak grasp, unable to perform hand to mouth, minimal wrist flexion and forearm supination.  Prior RCR. ?RUE Sensation: WNL ?RUE Coordination: decreased fine motor;decreased gross motor ?LUE Deficits / Details: improved shoulder flexion against gravity and AROM at elbow and hand ?LUE Sensation: WNL ?LUE Coordination: decreased fine motor;decreased gross motor ?  ?  ?  ?  ?  ? ?Vision   ?  ?  ?Perception   ?  ?Praxis   ?  ? ?Cognition Arousal/Alertness: Lethargic ?Behavior During Therapy: Flat affect ?Overall Cognitive Status: Difficult to assess ?  ?  ?  ?  ?  ?  ?  ?  ?  ?  ?  ?  ?  ?  ?  ?  ?  General Comments: able to follow commands periodically for RUE use ?  ?  ?   ?Exercises Exercises: General Upper Extremity ?General Exercises - Upper Extremity ?Shoulder Flexion: AAROM, Left, 5 reps ?Shoulder Extension: AAROM, Left, 5 reps ?Elbow Flexion: AAROM, AROM, Left, 5 reps ?Elbow Extension: AAROM, AROM, Left, 5 reps ?Digit Composite Flexion: AROM, Left, 5 reps ? ?  ?Shoulder Instructions   ? ? ?  ?General Comments VSS  ? ? ?Pertinent Vitals/ Pain       Pain Assessment ?Pain Assessment: No/denies pain ?Pain Intervention(s): Monitored during session ? ?Home  Living   ?  ?  ?  ?  ?  ?  ?  ?  ?  ?  ?  ?  ?  ?  ?  ?  ?  ?  ? ?  ?Prior Functioning/Environment    ?  ?  ?  ?   ? ?Frequency ? Min 2X/week  ? ? ? ? ?  ?Progress Toward Goals ? ?OT Goals(current goals can now be found in the care plan section) ? Progress towards OT goals: Progressing toward goals ? ?Acute Rehab OT Goals ?OT Goal Formulation: Patient unable to participate in goal setting ?Time For Goal Achievement: 02/28/22 ?Potential to Achieve Goals: Fair ?ADL Goals ?Pt Will Perform Grooming: sitting;with mod assist ?Pt Will Transfer to Toilet: with +2 assist;with mod assist ?Additional ADL Goal #1: Pt will complete bed mobility with mod A as a precursor to ADLs ?Additional ADL Goal #2: Pt will maintian midline posture while sitting EOB with min A for at least 5 minutes to perform ADLs  ?Plan Discharge plan remains appropriate   ? ?Co-evaluation ? ? ?   ?  ?  ?  ?  ? ?  ?AM-PAC OT "6 Clicks" Daily Activity     ?Outcome Measure ? ? Help from another person eating meals?: Total ?Help from another person taking care of personal grooming?: A Lot ?Help from another person toileting, which includes using toliet, bedpan, or urinal?: Total ?Help from another person bathing (including washing, rinsing, drying)?: A Lot ?Help from another person to put on and taking off regular upper body clothing?: A Lot ?Help from another person to put on and taking off regular lower body clothing?: Total ?6 Click Score: 9 ? ?  ?End of Session   ? ?OT Visit Diagnosis: Repeated falls (R29.6);Other abnormalities of gait and mobility (R26.89);Unsteadiness on feet (R26.81);Muscle weakness (generalized) (M62.81);Pain;Other (comment) ?  ?Activity Tolerance Patient limited by lethargy ?  ?Patient Left in bed;with call bell/phone within reach;with bed alarm set;with family/visitor present ?  ?Nurse Communication Mobility status ?  ? ?   ? ?Time: 9417-4081 ?OT Time Calculation (min): 13 min ? ?Charges: OT General Charges ?$OT Visit: 1 Visit ?OT  Treatments ?$Therapeutic Exercise: 8-22 mins ? ?Lodema Hong, OTA ?Acute Rehabilitation Services  ?Pager [phone] ?Office [phone] ? ? ?[place] ?02/26/2022, 2:20 PM ?

## 2022-02-26 NOTE — Progress Notes (Signed)
Physical Therapy Treatment ?Patient Details ?Name: Vernon [name] ?MRN: [id number] ?DOB: Dec 27, 1945 ?Today's Date: 02/26/2022 ? ? ?History of Present Illness Pt. is a 76 y.o. male presenting to [facility] on 3/18 with worseing L side coordination along with N/V. CTH demonstrated a 2,2 cm L cerebellar peduncle ICH. He had an epsidoe fo emisis and developed hypoxemia, requiring intubation as well as undergoing an emergent craniotomy on 3/18; hematoma evacuation and drain placement. PMH significant for anemia, arthritis, GERD, DM2, GERD, HTN, afibb on Eliquis. ? ?  ?PT Comments  ? ? Pt very lethargic/sleepy, sleeping on R side in fully reclined chair upon arrival. Pt however was observed earlier this morning interacting with spouse and RN staff and was very lucid. Pt sitting up in chair and alert. Pt now very fatigued and required maxAx2 for sit to stand pvt to bed. Acute PT to cont to follow. ?   ?Recommendations for follow up therapy are one component of a multi-disciplinary discharge planning process, led by the attending physician.  Recommendations may be updated based on patient status, additional functional criteria and insurance authorization. ? ?Follow Up Recommendations ? Acute inpatient rehab (3hours/day) ?  ?  ?Assistance Recommended at Discharge Intermittent Supervision/Assistance  ?Patient can return home with the following Two people to help with walking and/or transfers;Two people to help with bathing/dressing/bathroom;Assistance with cooking/housework;Assistance with feeding;Direct supervision/assist for financial management;Direct supervision/assist for medications management;Assist for transportation;Help with stairs or ramp for entrance ?  ?Equipment Recommendations ? Wheelchair (measurements PT);Wheelchair cushion (measurements PT);Hospital bed;Other (comment)  ?  ?Recommendations for Other Services Rehab consult ? ? ?  ?Precautions / Restrictions Precautions ?Precautions: Fall ?Precaution Comments:  cortrak ?Restrictions ?Weight Bearing Restrictions: No  ?  ? ?Mobility ? Bed Mobility ?Overal bed mobility: Needs Assistance ?Bed Mobility: Sit to Sidelying ?  ?  ?  ?Sit to supine: Max assist ?  ?General bed mobility comments: pt layed self down to the R, maxA for LE management due to lethargy and inability to stay awake ?  ? ?Transfers ?Overall transfer level: Needs assistance ?Equipment used: 2 person hand held assist ?Transfers: Sit to/from Stand ?Sit to Stand: Max assist, +2 physical assistance ?  ?Step pivot transfers: Max assist, +2 physical assistance ?  ?  ?  ?General transfer comment: max directional verbal and tactile cues to stand up, tactile cues to weight shift to promote advancement of LEs during transfer from chair to EOB ?  ? ?Ambulation/Gait ?  ?  ?  ?  ?  ?  ?  ?  ? ? ?Stairs ?  ?  ?  ?  ?  ? ? ?Wheelchair Mobility ?  ? ?Modified Rankin (Stroke Patients Only) ?Modified Rankin (Stroke Patients Only) ?Pre-Morbid Rankin Score: No symptoms ?Modified Rankin: Severe disability ? ? ?  ?Balance Overall balance assessment: Needs assistance ?Sitting-balance support: Single extremity supported, Bilateral upper extremity supported, Feet supported ?Sitting balance-Leahy Scale: Poor ?Sitting balance - Comments: due to lethargy pt with R lateral lean requiring modA to maintain EOB balance ?Postural control: Right lateral lean ?Standing balance support: Bilateral upper extremity supported ?Standing balance-Leahy Scale: Poor ?Standing balance comment: reliant on therapist for assistance with balance ?  ?  ?  ?  ?  ?  ?  ?  ?  ?  ?  ?  ? ?  ?Cognition Arousal/Alertness: Lethargic ?Behavior During Therapy: Flat affect ?Overall Cognitive Status: Difficult to assess ?  ?  ?  ?  ?  ?  ?  ?  ?  ?  ?  ?  ?  ?  ?  ?  ?  General Comments: Pt observed earlier in AM interacting with staff and sitting up in chair however pt very sleep in fully reclined in recliner on his R side difficult to arouse ?  ?  ? ?  ?Exercises   ? ?   ?General Comments General comments (skin integrity, edema, etc.): VSS ?  ?  ? ?Pertinent Vitals/Pain Pain Assessment ?Pain Assessment: No/denies pain  ? ? ?Home Living   ?  ?  ?  ?  ?  ?  ?  ?  ?  ?   ?  ?Prior Function    ?  ?  ?   ? ?PT Goals (current goals can now be found in the care plan section) Progress towards PT goals: Progressing toward goals ? ?  ?Frequency ? ? ? Min 4X/week ? ? ? ?  ?PT Plan Current plan remains appropriate  ? ? ?Co-evaluation   ?  ?  ?  ?  ? ?  ?AM-PAC PT "6 Clicks" Mobility   ?Outcome Measure ? Help needed turning from your back to your side while in a flat bed without using bedrails?: Total ?Help needed moving from lying on your back to sitting on the side of a flat bed without using bedrails?: Total ?Help needed moving to and from a bed to a chair (including a wheelchair)?: Total ?Help needed standing up from a chair using your arms (e.g., wheelchair or bedside chair)?: Total ?Help needed to walk in hospital room?: Total ?Help needed climbing 3-5 steps with a railing? : Total ?6 Click Score: 6 ? ?  ?End of Session   ?Activity Tolerance: Patient limited by lethargy ?Patient left: in bed;with call bell/phone within reach;with bed alarm set;with family/visitor present ?Nurse Communication: Mobility status ?PT Visit Diagnosis: Muscle weakness (generalized) (M62.81);Difficulty in walking, not elsewhere classified (R26.2) ?  ? ? ?Time: 1208-1226 ?PT Time Calculation (min) (ACUTE ONLY): 18 min ? ?Charges:  $Therapeutic Activity: 8-22 mins          ?          ? ?Kittie Plater, PT, DPT ?Acute Rehabilitation Services ?Secure chat preferred ?Office #: [phone] ? ? ? ?Vernon [name] Vernon [name] ?02/26/2022, 1:28 PM ? ?

## 2022-02-26 NOTE — PMR Pre-admission (Addendum)
PMR Admission Coordinator Pre-Admission Assessment ? ?Patient: Vernon [name] is an 76 y.o., male ?MRN: [id number] ?DOB: 1946-01-05 ?Height: 5\' 9"  (175.3 cm) ?Weight: 92.2 kg ? ?Insurance Information ?HMO: yes    PPO:      PCP:      IPA:      80/20:      OTHER:  ?PRIMARY: [employer] Medicare      Policy#: [id number]      Subscriber: pt ?CM Name:       Phone#: [phone]     Fax#: [phone] ?Pre-Cert#: [id number] approval for 7 days from 4/1 with updates due to fax listed above on 4/7      Employer:  ?Benefits:  Phone #: [phone]     Name: n/a ?Eff. Date: 11/26/21     Deduct: $0      Out of Pocket Max: $4500 (met $321.74)      Life Max: n/a ?CIR: $325/day for days 1-5      SNF: 20 full days ?Outpatient:      Co-Pay: $20/visit ?Home Health: 100%      Co-Pay:  ?DME: 80%     Co-ins: 20% ?Providers:  ?SECONDARY:       Policy#:      Phone#:  ? ?Financial Counselor:       Phone#:  ? ?The ?Data Collection Information Summary? for patients in Inpatient Rehabilitation Facilities with attached ?Privacy Act [place] Records? was provided and verbally reviewed with: Patient and Family ? ?Emergency Contact Information ?Contact Information   ? ? Name Relation Home Work Mobile  ? [place]  [phone]  ? JAQUAVIS, FELMLEE Son   [phone]  ? ?  ? ? ?Current Medical History  ?Patient Admitting Diagnosis: L cerebellar ICH s/p crani evacuation ? ?History of Present Illness: Vernon [name] is a 76 year old right-handed male with history of chronic anemia, diabetes mellitus, hyperlipidemia, hypertension, atrial fibrillation maintained on Eliquis.  Per chart review patient lives with spouse.  Able to stay on main level with bed and bath.  Independent driving prior to admission and retired.  Wife works full-time.  Presented 02/10/2022 with left-sided weakness and incoordination as well as nausea vomiting.  CT of the head showed acute 2.2 cm intraparenchymal hemorrhage in the left middle cerebellar peduncle and  probable small volume adjacent extra-axial extension.  Surrounding edema with mild effacement of the fourth ventricle.   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  Mild atherosclerosis without greater than 50% stenosis.  Eliquis was reversed with [name].  Patient underwent suboccipital craniectomy and evacuation of cerebellar hemorrhage.  Placement of right parieto-occipital ventriculostomy 02/11/2022 per Dr. Ellene Route.  Maintained on Keppra 14 days postoperatively ending 02/24/2022.  Follow-up CTs/MRI no hydrocephalus.  EVD was pulled 3/22.  Patient did require short-term intubation through 02/14/2022.  Postoperative course finding the left-sided pleural effusion and moderate to large right side pleural effusion and underwent thoracentesis with 1250 cc yield.  Latest chest x-ray showing no active disease.  Patient currently remains n.p.o. with nutritional tube feeds.  He was cleared to begin subcutaneous heparin for DVT prophylaxis 02/12/2022.  Neurology notes considering resuming AC at 4 weeks.  Bouts of restlessness and agitation with Seroquel added.  Therapy evaluations completed due to patient decreased functional mobility left side weakness was recommended for a comprehensive rehab program. ? ?Complete NIHSS TOTAL: 9 ? ?Patient's medical record from [facility] has been reviewed by the rehabilitation admission coordinator and physician. ? ?Past Medical History  ?  Past Medical History:  ?Diagnosis Date  ? Anemia   ? Aortic stenosis   ? Arthritis   ? Complication of anesthesia   ? hard time getting bp up after knee replacement  ? Diabetes mellitus without complication ([place])   ? GERD (gastroesophageal reflux disease)   ? occ tums prn  ? History of hiatal hernia   ? Hypertension   ? 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[place])   ? ? ?Has the patient had major surgery during 100 days prior to admission? Yes ? ?Family History   ?family history includes Cancer in his maternal  uncle. ? ?Current Medications ? ?Current Facility-Administered Medications:  ?  0.9 %  sodium chloride infusion, , Intravenous, Continuous, Eugenie Filler, MD, Last Rate: 100 mL/hr at 02/25/22 2249, New Bag at 02/25/22 2249 ?  acetaminophen (TYLENOL) tablet 650 mg, 650 mg, Oral, Q6H PRN, Tacey Ruiz, MD, 650 mg at 02/24/22 0854 ?  albuterol (PROVENTIL) (2.5 MG/3ML) 0.083% nebulizer solution 2.5 mg, 2.5 mg, Nebulization, Q4H PRN, Simonne Maffucci B, MD ?  amantadine (SYMMETREL) 50 MG/5ML solution 100 mg, 100 mg, Per Tube, BID, Eugenie Filler, MD, 100 mg at 02/26/22 1540 ?  bisacodyl (DULCOLAX) suppository 10 mg, 10 mg, Rectal, Daily PRN, Kristeen Miss, MD, 10 mg at 02/14/22 1139 ?  carvedilol (COREG) tablet 3.125 mg, 3.125 mg, Per Tube, BID WC, Eugenie Filler, MD, 3.125 mg at 02/26/22 0867 ?  chlorhexidine gluconate (MEDLINE KIT) (PERIDEX) 0.12 % solution 15 mL, 15 mL, Mouth Rinse, BID, Kristeen Miss, MD, 15 mL at 02/26/22 0835 ?  feeding supplement (OSMOLITE 1.5 CAL) liquid 1,000 mL, 1,000 mL, Per Tube, Continuous, Eugenie Filler, MD, Last Rate: 60 mL/hr at 02/25/22 2118, 1,000 mL at 02/25/22 2118 ?  feeding supplement (PROSource TF) liquid 45 mL, 45 mL, Per Tube, BID, Eugenie Filler, MD, 45 mL at 02/26/22 0835 ?  free water 200 mL, 200 mL, Per Tube, Q4H, Eugenie Filler, MD, 200 mL at 02/26/22 0835 ?  heparin injection 5,000 Units, 5,000 Units, Subcutaneous, Q8H, Rosalin Hawking, MD, 5,000 Units at 02/26/22 0526 ?  insulin aspart (novoLOG) injection 0-15 Units, 0-15 Units, Subcutaneous, Q4H, Juanito Doom, MD, 2 Units at 02/26/22 [phone] ?  insulin aspart (novoLOG) injection 2 Units, 2 Units, Subcutaneous, Q4H, Jennelle Human B, NP, 2 Units at 02/26/22 [phone] ?  insulin glargine-yfgn (SEMGLEE) injection 30 Units, 30 Units, Subcutaneous, Daily, Simonne Maffucci B, MD, 30 Units at 02/26/22 0834 ?  labetalol (NORMODYNE) injection 10-40 mg, 10-40 mg, Intravenous, Q10 min PRN, Kristeen Miss, MD,  10 mg at 02/16/22 0534 ?  levETIRAcetam (KEPPRA) 100 MG/ML solution 500 mg, 500 mg, Per Tube, BID, Willette Cluster, RPH, 500 mg at 02/26/22 6712 ?  OLANZapine zydis (ZYPREXA) disintegrating tablet 2.5 mg, 2.5 mg, Oral, 2 times per day, Pham, Minh Q, RPH-CPP, 2.5 mg at 02/26/22 0836 ?  pantoprazole sodium (PROTONIX) 40 mg/20 mL oral suspension 40 mg, 40 mg, Per Tube, Daily, Jacky Kindle, MD, 40 mg at 02/26/22 0837 ?  polyethylene glycol (MIRALAX / GLYCOLAX) packet 17 g, 17 g, Per Tube, Daily, Jacky Kindle, MD, 17 g at 02/26/22 0837 ?  senna-docusate (Senokot-S) tablet 1 tablet, 1 tablet, Per Tube, BID, Kristeen Miss, MD, 1 tablet at 02/26/22 0835 ? ?Patients Current Diet:  ?Diet Order   ? ?       ?  Diet - low sodium heart healthy       ?  ?  Diet NPO time specified  Diet effective now       ?  ? ?  ?  ? ?  ? ? ?Precautions / Restrictions ?Precautions ?Precautions: Fall ?Precaution Comments: [name], bilateral mitts ?Restrictions ?Weight Bearing Restrictions: No  ? ?Has the patient had 2 or more falls or a fall with injury in the past year? No ? ?Prior Activity Level ?Community (5-7x/wk): drives, retired, [place] and cares for it ? ?Prior Functional Level ?Self Care: Did the patient need help bathing, dressing, using the toilet or eating? Independent ? ?Indoor Mobility: Did the patient need assistance with walking from room to room (with or without device)? Independent ? ?Stairs: Did the patient need assistance with internal or external stairs (with or without device)? Independent ? ?Functional Cognition: Did the patient need help planning regular tasks such as shopping or remembering to take medications? Independent ? ?Patient Information ?Are you of Hispanic, Latino/a,or Spanish origin?: A. No, not of Hispanic, Latino/a, or Spanish origin ?What is your race?: A. White ?Do you need or want an interpreter to communicate with a doctor or health care staff?: 0. No ? ?Patient's Response To:  ?Health  Literacy and Transportation ?Is the patient able to respond to health literacy and transportation needs?: Yes ?Health Literacy - How often do you need to have someone help you when you read instructions, pamphlets, o

## 2022-02-26 NOTE — Progress Notes (Signed)
Speech Language Pathology Treatment: Dysphagia  ?Patient Details ?Name: Vernon [name] ?MRN: [id number] ?DOB: 1946/01/06 ?Today's Date: 02/26/2022 ?Time: 1000-1020 ?SLP Time Calculation (min) (ACUTE ONLY): 20 min ? ?Assessment / Plan / Recommendation ?Clinical Impression ? Mr. Schwegler was seen at bedside for skilled SLP services targeting dysphagia goals. Pt is making steady progress towards PO readiness and dysphagia goals. Pt's level of arousal/attention/mentation improved since the last targeted session (02/23/22). Pt was able to follow intermittent simple one-step commands and responded with social, automatic responses. Pt able to engage in assisted self-feeding to receive individual sips of thin liquids via straw cup. Pt demonstrated intermittent throat clearing and delayed cough response with sips of thins. Suspected delayed swallow initiation and primary pharyngeal timing issue. Pt tolerated puree texture with no overt s/s of aspiration. Noted mild oral residue with applesauce, which may be a result of level of mentation and attention to PO trials. SLP provided small bite of regular graham cracker to the pt and he could not lateralize bolus in order to masticate. Graham cracker stayed on lingual surface and pt had to spit it out. SLP attempted sips of nectar thick liquid via straw cup, however pt fatigued and did not accept NTL trials. Pt may have floor stock purees as level of arousal and attention allow. Pt may benefit from instrumental swallow study in next few days when he is more appropriate. Temporary alternative means of nutrition remain appropriate at this time.  ?  ?HPI HPI: Pt is a 76 y.o. male who presented to the ED on 3/18 with worseing L side coordination and N/V. 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 Surrounding edema with mild effacement of the fourth ventricle. Pt had an epsidoe fo  emesis and developed hypoxemia, requiring intubation for airway protection. ETT 3/18-3/22. Pt s/p emergent craniotomy on 3/18, hematoma evacuation and drain placement. [name] placed 3/20. PMH: anemia, arthritis, GERD, DM2, GERD, HTN, afibb on Eliquis.  Pt follow up for swallow conducted as pt with agitation and AMS and was made NPO prior date. ?  ?   ?SLP Plan ? Continue with current plan of care ? ?  ?  ?Recommendations for follow up therapy are one component of a multi-disciplinary discharge planning process, led by the attending physician.  Recommendations may be updated based on patient status, additional functional criteria and insurance authorization. ?  ? ?Recommendations  ?Diet recommendations: NPO ?Liquids provided via: Cup;Straw ?Medication Administration: Via alternative means ?Supervision: Full supervision/cueing for compensatory strategies;Trained caregiver to feed patient ?Compensations: Slow rate;Small sips/bites;Follow solids with liquid ?Postural Changes and/or Swallow Maneuvers: Seated upright 90 degrees  ?   ?    ?   ? ? ? ? Oral Care Recommendations: Oral care BID ?Follow Up Recommendations: Acute inpatient rehab (3hours/day) ?Assistance recommended at discharge: Frequent or constant Supervision/Assistance ?SLP Visit Diagnosis: Dysphagia, oropharyngeal phase (R13.12) ?Plan: Continue with current plan of care ? ? ? ? ?  ?  ? ? ?Vaughan Sine, Student-SLP ? ?

## 2022-02-26 NOTE — Consult Note (Addendum)
?Palliative Medicine Inpatient Consult Note ? ?Consulting Provider: Eugenie Filler, MD ? ?Reason for consult:   ?[facility] Consult  ?Reason for Consult? Goals of care  ? ?HPI:  ?Per intake H&P --> 76 yo with hx atrial fibrillation on eliquis, T2DM, anemia, GERD, aortic stenosis, MDS and multiple other medical problems presented with weakness and nausea, found to have a cerebellar hemorrhage.  He required intubation for airway protection and underwent emergent craniotomy on 3/18 with hematoma evacuation and drain placement.   ? ?Palliative care has been asked to get involved to further discuss goals of care in the setting of ICH and ongoing/worsening encephalopathy. ? ?Clinical Assessment/Goals of Care: ? ?*Please note that this is a verbal dictation therefore any spelling or grammatical errors are due to the "[employer]" system interpretation. ? ?I have reviewed medical records including EPIC notes, labs and imaging, received report from bedside RN, assessed the patient.  ?  ?I met with Vernon [name]  to further discuss diagnosis prognosis, GOC, EOL wishes, disposition and options. ?  ?I introduced Palliative Medicine as specialized medical care for people living with serious illness. It focuses on providing relief from the symptoms and stress of a serious illness. The goal is to improve quality of life for both the patient and the family. ? ?Medical History Review and Understanding: ? ?Vernon [name] and I reviewed Vernon history of atrial fibrillation, type 2 diabetes, MDS, aortic stenosis.  We discussed Vernon hospitalization in the setting of an intracranial hemorrhage for which he has received craniotomy. ? ?I shared the concern at this point in time is Vernon ongoing encephalopathy which is waxing and waning.   ? ?Social History: ? ?Vernon [name] is from [place], [place].  He has been married to Vernon [name]. Vernon [name] for the past 39 years.  They share 2 children and 2  grandchildren together.  He used to work for [employer], [place].  He enjoys fishing.  He has a very strong faith in God and practices within the [facility] denomination. ? ?Functional and Nutritional State: ? ?Prior to hospitalization Vernon [name] was performing all B ADLs and IADLs without any assistance.  He was able to ride on a tractor and manage their 28 acre property. ? ?Vernon [name] had a robust appetite prior to hospitalization. ? ?Palliative Symptoms: ? ?Generalized weakness - PT/OT and transition to CIR. ? ?Dysphagia - Ongoing speech therapy. ? ?Advance Directives: ? ?A detailed discussion was had today regarding advanced directives. Patient shares that he does have a living will, he consents to me asking Vernon [name] to bring this in.  ? ?Code Status: ? ?Concepts specific to code status, artifical feeding and hydration, continued IV antibiotics and rehospitalization was had.  The difference between a aggressive medical intervention path  and a palliative comfort care path for this patient at this time was had. A MOST form was reviewed, for the time being, Vernon [name] would want all measures to sustain life. I asked him to repeat concepts back to me which he was unable to thoroughly do. I asked if I could speak to Vernon [name] which he shared would be okay. ? ?Mobility: ? ?Vernon [name] was able to get to the side of the bed, sit up and remain this way for five minutes. He was able to stable with myself on Vernon right side. He appears to have poor safety awareness. I asked him to use Vernon legs to mobilize to the chair - instead of moving to the right he  moved to the left and lost balance. He was gently guided to the floor. I was able to have two nursing students assist getting him into the recliner thereafter. No injuries were sustained. ? ?Goals: ? ?Vernon [name] would ideally like "to get better and be able to go back home." ? ?Discussed the importance of continued conversation with family and their  medical providers regarding overall plan of care  and treatment options, ensuring decisions are within the context of the patients values and GOCs. ?________________________________ ?Addendum: ? ?I met with Vernon [name] and Vernon [name], Vernon [name] later in the morning. We reviewed Vernon [name]'s acute on chronic disease processes. ? ?Vernon [name] is delighted with how much progress Vernon [name] is made in the setting of receiving Zyprexa.  She shares that he looks like a different person than he had this weekend.  We reviewed that over the weekend Vernon [name] encephalopathy was so severe that Vernon [name] had conversations with her 2 children about the possibility of hospice care. ? ?Vernon [name] has a variety of concerns in terms of the long-term.  We talked about Vernon [name]'s inability right now to receive anticoagulation for Vernon atrial fibrillation and the idea that when he can receive it he may have worsening bleeding.  She expresses that she is not sure in terms of what direction to go as Vernon mother had a similar situation which led to her death.  We reviewed that at this point in time it is unclear how Vernon [name] journey will and and for the time being to support him and see how much progress he can make would be very reasonable. ? ?As far as physical progress Vernon [name] feels today is a step in the right direction.  She was informed of Vernon guided transition to the ground this morning.  She expresses that either way this is the best she has seen him in over a week.  She feels Vernon delirium is improving as he is saying things that are making more sense.  A comprehensive review of delirium in patients such as Vernon [name] was held and the reasons for which he is enduring this.  We discussed that delirium is very complicated and it of itself and can be a precursor to a new baseline level of functioning for patients.  Vernon [name] asked if he does not improve greatly what are her resources in terms of caring for him.  She shares that he is a [place] patient and was receiving care and receives medications from the Vernon [name] offices.  I expressed that that is not  my area of expertise though I will ask our medical social worker, Vernon [name] to reach out to her. ? ?In terms of [facility] CODE STATUS another conversation was held.  Again it does not seem he is fully grasping the concept of cardiopulmonary resuscitation and the efforts that would take to prolong Vernon existence nor does he understand the possible harms associated with this task.  Vernon [name] does understand these things though we agreed that it would be most beneficial if Vernon [name] can make these decisions for himself.  Vernon [name] shares that she has looked high and low further living will to give her better guidance though she has not been able to find this. ? ?The review of the what if's and best case worst-case scenarios was had in the setting of Vernon [name]'s clinical situation.  A brief review of outpatient palliative care services versus hospice was had.  Patient's [name] had asked if we could follow Vernon [name] up in [place] to continue these conversations.  I endorse that  I would reach out to Dr. Tessa Lerner to see if a consult could be placed. ? ?Provided "Hard Choices for Loving People" booklet.  ? ?Decision Maker: ?Jahrell Hamor (spouse): [phone] ? ?SUMMARY OF RECOMMENDATIONS   ?Full code for the time being awaiting further improvement of patient's encephalopathy to verify he understands concepts of cardiopulmonary resuscitation.  Patient's [name] does not think he would necessarily want long-term life-sustaining measures. ? ?Encephalopathy appears to be better controlled with Zyprexa twice daily if needed moving forward it could be titrated to 3 times daily. ? ?This case worst-case scenarios were played out with patient's spouse, Vernon [name] a discussion of the what if's in terms of patient progressing versus not progressing in rehabilitation was held. ? ?Discussion between outpatient palliative support and hospice was held. ? ?Plan for transition to CIR today for ongoing rehabilitation in the setting of muscular weakness and deconditioning as well as  dysphagia. ? ?Outpatient palliative care would be of benefit if patient progresses well enough to go back home. ? ?Appreciate medical social worker reaching out to patient's [name] to discuss navigation of the [place] s

## 2022-02-27 ENCOUNTER — Encounter: Payer: Self-pay | Admitting: Internal Medicine

## 2022-02-27 LAB — COMPREHENSIVE METABOLIC PANEL
ALT: 24 U/L (ref 0–44)
AST: 22 U/L (ref 15–41)
Albumin: 3.2 g/dL — ABNORMAL LOW (ref 3.5–5.0)
Alkaline Phosphatase: 162 U/L — ABNORMAL HIGH (ref 38–126)
Anion gap: 6 (ref 5–15)
BUN: 41 mg/dL — ABNORMAL HIGH (ref 8–23)
CO2: 27 mmol/L (ref 22–32)
Calcium: 9 mg/dL (ref 8.9–10.3)
Chloride: 106 mmol/L (ref 98–111)
Creatinine, Ser: 1.69 mg/dL — ABNORMAL HIGH (ref 0.61–1.24)
GFR, Estimated: 42 mL/min — ABNORMAL LOW (ref >60)
Glucose, Bld: 175 mg/dL — ABNORMAL HIGH (ref 70–99)
Potassium: 4.9 mmol/L (ref 3.5–5.1)
Sodium: 139 mmol/L (ref 135–145)
Total Bilirubin: 2 mg/dL — ABNORMAL HIGH (ref 0.3–1.2)
Total Protein: 6.6 g/dL (ref 6.5–8.1)

## 2022-02-27 LAB — GLUCOSE, CAPILLARY
Glucose-Capillary: 113 mg/dL — ABNORMAL HIGH (ref 70–99)
Glucose-Capillary: 148 mg/dL — ABNORMAL HIGH (ref 70–99)
Glucose-Capillary: 151 mg/dL — ABNORMAL HIGH (ref 70–99)
Glucose-Capillary: 160 mg/dL — ABNORMAL HIGH (ref 70–99)
Glucose-Capillary: 181 mg/dL — ABNORMAL HIGH (ref 70–99)
Glucose-Capillary: 198 mg/dL — ABNORMAL HIGH (ref 70–99)
Glucose-Capillary: 199 mg/dL — ABNORMAL HIGH (ref 70–99)

## 2022-02-27 LAB — CBC WITH DIFFERENTIAL/PLATELET
Abs Immature Granulocytes: 0.06 10*3/uL (ref 0.00–0.07)
Basophils Absolute: 0 10*3/uL (ref 0.0–0.1)
Basophils Relative: 0 %
Eosinophils Absolute: 0.1 10*3/uL (ref 0.0–0.5)
Eosinophils Relative: 1 %
HCT: 24.7 % — ABNORMAL LOW (ref 39.0–52.0)
Hemoglobin: 8 g/dL — ABNORMAL LOW (ref 13.0–17.0)
Immature Granulocytes: 1 %
Lymphocytes Relative: 10 %
Lymphs Abs: 1 10*3/uL (ref 0.7–4.0)
MCH: 30 pg (ref 26.0–34.0)
MCHC: 32.4 g/dL (ref 30.0–36.0)
MCV: 92.5 fL (ref 80.0–100.0)
Monocytes Absolute: 0.8 10*3/uL (ref 0.1–1.0)
Monocytes Relative: 8 %
Neutro Abs: 8.5 10*3/uL — ABNORMAL HIGH (ref 1.7–7.7)
Neutrophils Relative %: 80 %
Platelets: 342 10*3/uL (ref 150–400)
RBC: 2.67 MIL/uL — ABNORMAL LOW (ref 4.22–5.81)
RDW: 26.5 % — ABNORMAL HIGH (ref 11.5–15.5)
WBC: 10.5 10*3/uL (ref 4.0–10.5)
nRBC: 0.2 % (ref 0.0–0.2)

## 2022-02-27 MED ORDER — TRAZODONE HCL 50 MG PO TABS
50.0000 mg | ORAL_TABLET | Freq: Once | ORAL | Status: AC
  Administered 2022-02-27: 50 mg via ORAL
  Filled 2022-02-27: qty 1

## 2022-02-27 MED ORDER — OSMOLITE 1.5 CAL PO LIQD
1000.0000 mL | ORAL | Status: DC
  Administered 2022-02-27 – 2022-03-11 (×14): 1000 mL
  Filled 2022-02-27 (×12): qty 1000

## 2022-02-27 NOTE — Evaluation (Addendum)
Physical Therapy Assessment and Plan ? ?Patient Details  ?Name: Vernon [name] ?MRN: [id number] ?Date of Birth: 31-Aug-1946 ? ?PT Diagnosis: Ataxia, Coordination disorder, Difficulty walking, Hemiparesis non-dominant, Impaired cognition, and Muscle weakness ?Rehab Potential: Fair ?ELOS: 21-24 days  ? ?Today's Date: 02/27/2022 ?PT Individual Time: 1100-1158 ?PT Individual Time Calculation (min): 58 min   ? ?Hospital Problem: Principal Problem: ?  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 hemorrhage of brain ([place]) ? ? ?Past Medical History:  ?Past Medical History:  ?Diagnosis Date  ? Anemia   ? Aortic stenosis   ? Arthritis   ? Complication of anesthesia   ? hard time getting bp up after knee replacement  ? Diabetes mellitus without complication ([place])   ? GERD (gastroesophageal reflux disease)   ? occ tums prn  ? History of hiatal hernia   ? Hypertension   ? 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[place])   ? ?Past Surgical History:  ?Past Surgical History:  ?Procedure Laterality Date  ? CARPAL TUNNEL RELEASE  2012  ? CRANIOTOMY N/A 02/10/2022  ? Procedure: SUBOCCIPITAL CRANIECTOMY FOR EVACUATION OF CEREBELLAR HEMATOMA;  Surgeon: Kristeen Miss, MD;  Location: [facility];  Service: Neurosurgery;  Laterality: N/A;  ? JOINT REPLACEMENT Right 2010  ? SHOULDER ARTHROSCOPY WITH ROTATOR CUFF REPAIR AND OPEN BICEPS TENODESIS Right 11/09/2019  ? Procedure: RIGHT SHOULDER ARTHROSCOPY WITH SUBSCAPULARIS REPAIR, SUBACROMIAL DECOMPRESSION,MINI OPEN ROTATOR CUFF REPAIR;  Surgeon: Leim Fabry, MD;  Location: [facility];  Service: Orthopedics;  Laterality: Right;  ? ? ?Assessment & Plan ?Clinical Impression: Patient is a 76 y.o. right-handed male with history of chronic anemia, diabetes mellitus, hyperlipidemia, hypertension, atrial fibrillation maintained on Eliquis.  Per chart review patient lives with spouse.  Able to stay on main level with bed and bath.  Independent driving prior to admission and retired.  Wife works full-time.  Presented 02/10/2022 with left-sided weakness and  incoordination as well as nausea vomiting.  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  Surrounding edema with mild effacement of the fourth ventricle.   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  Mild atherosclerosis without greater than 50% stenosis.  Eliquis was reversed with [name].  Patient underwent suboccipital craniectomy and evacuation of cerebellar hemorrhage.  Placement of right parieto-occipital ventriculostomy 02/11/2022 per Dr. Ellene Route.  Maintained on Keppra 14 days postoperatively ending 02/24/2022.  Follow-up CTs/MRI no hydrocephalus.  EVD was pulled 3/22.  Patient did require short-term intubation through 02/14/2022.  Postoperative course finding the left-sided pleural effusion and moderate to large right side pleural effusion and underwent thoracentesis with 1250 cc yield.  Latest chest x-ray showing no active disease.  Patient currently remains n.p.o. with nutritional tube feeds.  He was cleared to begin subcutaneous heparin for DVT prophylaxis 02/12/2022.  Neurology notes considering resuming AC at 4 weeks.  Bouts of restlessness and agitation with Seroquel added changed to Zyprexa via as well as the addition of amantadine with latest follow-up CT scan of the head 02/20/2022 showed no evidence of acute intracranial abnormality..  Palliative care therapy evaluations completed due to patient decreased functional mobility left side weakness was admitted for a comprehensive rehab program.  Patient transferred to CIR on 02/26/2022 .  ? ?Patient currently requires mod with mobility secondary to muscle weakness, decreased cardiorespiratoy endurance, impaired timing and sequencing, unbalanced muscle activation, and decreased coordination, field cut, decreased midline orientation, decreased attention to left, and decreased motor planning, decreased  initiation,  decreased attention, decreased awareness, decreased problem solving, decreased safety awareness, decreased memory, and delayed processing, and decreased sitting balance, decreased standing balance, and decreased balance strategies.  Prior to hospitalization, patient was modified independent  with mobility and lived with Spouse in a House home.  Home access is 5Stairs to enter. ? ?Patient will benefit from skilled PT intervention to maximize safe functional mobility, minimize fall risk, and decrease caregiver burden for planned discharge home with 24 hour assist.  Anticipate patient will benefit from follow up [facility] at discharge. ? ?PT - End of Session ?Activity Tolerance: Tolerates 10 - 20 min activity with multiple rests ?Endurance Deficit: Yes ?PT Assessment ?Rehab Potential (ACUTE/IP ONLY): Fair ?PT Barriers to Discharge: Inaccessible home environment;Decreased caregiver support;Home environment access/layout;Incontinence;Lack of/limited family support;Insurance for SNF coverage;Nutrition means ?PT Patient demonstrates impairments in the following area(s): Balance;Behavior;Edema;Endurance;Motor;Nutrition;Perception;Safety ?PT Transfers Functional Problem(s): Bed Mobility;Bed to Chair;Car;Furniture ?PT Locomotion Functional Problem(s): Ambulation;Wheelchair Mobility;Stairs ?PT Plan ?PT Intensity: Minimum of 1-2 x/day ,45 to 90 minutes ?PT Frequency: 5 out of 7 days ?PT Duration Estimated Length of Stay: 21-24 days ?PT Treatment/Interventions: Ambulation/gait training;Community reintegration;DME/adaptive equipment instruction;Neuromuscular re-education;Psychosocial support;Stair training;UE/LE Strength taining/ROM;Wheelchair propulsion/positioning;UE/LE Coordination activities;Therapeutic Activities;Skin care/wound management;Pain management;Functional electrical stimulation;Discharge planning;Balance/vestibular training;Cognitive remediation/compensation;Disease management/prevention;Functional mobility  training;Splinting/orthotics;Patient/family education;Therapeutic Exercise;Visual/perceptual remediation/compensation ?PT Transfers Anticipated Outcome(s): supervision ?PT Locomotion Anticipated Outcome(s): CGA ?PT Recommendation ?Recommendations for Other Services: Therapeutic Recreation consult ?Therapeutic Recreation Interventions: Stress management;Kitchen group ?Follow Up Recommendations: Home health PT ?Patient destination: Home ?Equipment Recommended: To be determined ? ? ?PT Evaluation ?Precautions/Restrictions ?Precautions ?Precautions: Fall ?Precaution Comments: [name], L weakness, L coordination deficit ?Restrictions ?Weight Bearing Restrictions: No ?General ?  Vital Signs ?Pain ?Pain Assessment ?Pain Scale: 0-10 ?Pain Score: 0-No pain ?Pain Interference ?Pain Interference ?Pain Effect on Sleep: 1. Rarely or not at all ?Pain Interference with Therapy Activities: 1. Rarely or not at all ?Pain Interference with Day-to-Day Activities: 1. Rarely or not at all ?Home Living/Prior Functioning ?Home Living ?Living Arrangements: Spouse/significant other ?Available Help at Discharge: Family;Available 24 hours/day ?Type of Home: House ?Home Access: Stairs to enter ?Entrance Stairs-Number of Steps: 5 ?Entrance Stairs-Rails: Left;Right ?Home Layout: Multi-level;Able to live on main level with bedroom/bathroom ?Bathroom Shower/Tub: Walk-in shower ?Bathroom Toilet: Standard ?Additional Comments: supportive family, wife works full time ? Lives With: Spouse ?Prior Function ?Level of Independence: Independent with basic ADLs;Independent with homemaking with ambulation;Independent with gait;Independent with transfers ? Able to Take Stairs?: Yes ?Driving: Yes ?Vocation: Retired ?Vision/Perception  ?Vision - History ?Ability to See in Adequate Light: 1 Impaired ?Perception ?Perception: Impaired ?Praxis ?Praxis: Impaired ?Praxis Impairment Details: Initiation;Motor planning  ?Cognition ?Overall Cognitive Status:  Impaired/Different from baseline ?Arousal/Alertness: Awake/alert ?Orientation Level: Oriented to person;Oriented to situation ?Year: Other (Comment) (1992) ?Month:  (unable to state) ?Day of Week: Incorrect ?Attention

## 2022-02-27 NOTE — Evaluation (Signed)
Occupational Therapy Assessment and Plan ? ?Patient Details  ?Name: Vernon [name] ?MRN: [id number] ?Date of Birth: [birth year] ? ?OT Diagnosis: abnormal posture, cognitive deficits, disturbance of vision, hemiplegia affecting non-dominant side, and muscle weakness (generalized) ?Rehab Potential: Rehab Potential (ACUTE ONLY): Fair ?ELOS: 3-3.5 weeks  ? ?Today's Date: 02/27/2022 ?OT Individual Time: 7829-5621 ?OT Individual Time Calculation (min): 53 min    ? ?Hospital Problem: Principal Problem: ?  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 hemorrhage of brain ([place]) ? ? ?Past Medical History:  ?Past Medical History:  ?Diagnosis Date  ? Anemia   ? Aortic stenosis   ? Arthritis   ? Complication of anesthesia   ? hard time getting bp up after knee replacement  ? Diabetes mellitus without complication ([place])   ? GERD (gastroesophageal reflux disease)   ? occ tums prn  ? History of hiatal hernia   ? Hypertension   ? 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[place])   ? ?Past Surgical History:  ?Past Surgical History:  ?Procedure Laterality Date  ? CARPAL TUNNEL RELEASE  2012  ? CRANIOTOMY N/A 02/10/2022  ? Procedure: SUBOCCIPITAL CRANIECTOMY FOR EVACUATION OF CEREBELLAR HEMATOMA;  Surgeon: Kristeen Miss, MD;  Location: [place];  Service: Neurosurgery;  Laterality: N/A;  ? JOINT REPLACEMENT Right 2010  ? SHOULDER ARTHROSCOPY WITH ROTATOR CUFF REPAIR AND OPEN BICEPS TENODESIS Right 11/09/2019  ? Procedure: RIGHT SHOULDER ARTHROSCOPY WITH SUBSCAPULARIS REPAIR, SUBACROMIAL DECOMPRESSION,MINI OPEN ROTATOR CUFF REPAIR;  Surgeon: Leim Fabry, MD;  Location: [facility];  Service: Orthopedics;  Laterality: Right;  ? ? ?Assessment & Plan ?Clinical Impression: Patient is a 76 y.o. year old male with history of chronic anemia, diabetes mellitus, hyperlipidemia, hypertension, atrial fibrillation maintained on Eliquis.  Per chart review patient lives with spouse.  Able to stay on main level with bed and bath.  Independent driving prior to admission and retired.  Wife works full-time.   Presented 02/10/2022 with left-sided weakness and incoordination as well as nausea vomiting.  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  Surrounding edema with mild effacement of the fourth ventricle.   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  Mild atherosclerosis without greater than 50% stenosis.  Eliquis was reversed with [name].  Patient underwent suboccipital craniectomy and evacuation of cerebellar hemorrhage.  Placement of right parieto-occipital ventriculostomy 02/11/2022 per Dr. Ellene Route.  Maintained on Keppra 14 days postoperatively ending 02/24/2022.  Follow-up CTs/MRI no hydrocephalus.  EVD was pulled 3/22.  Patient did require short-term intubation through 02/14/2022.  Postoperative course finding the left-sided pleural effusion and moderate to large right side pleural effusion and underwent thoracentesis with 1250 cc yield.  Latest chest x-ray showing no active disease.  Patient currently remains n.p.o. with nutritional tube feeds.  He was cleared to begin subcutaneous heparin for DVT prophylaxis 02/12/2022.  Neurology notes considering resuming AC at 4 weeks.  Bouts of restlessness and agitation with Seroquel added changed to Zyprexa via as well as the addition of amantadine with latest follow-up CT scan of the head 02/20/2022 showed no evidence of acute intracranial abnormality..  Palliative care therapy evaluations completed due to patient decreased functional mobility left side weakness was admitted for a comprehensive rehab program..  Patient transferred to CIR on 02/26/2022 .   ? ?Patient currently requires total with basic self-care skills secondary to muscle weakness, decreased cardiorespiratoy endurance, impaired timing and sequencing, abnormal tone, unbalanced muscle activation, decreased coordination, and decreased motor  planning,  decreased visual perceptual skills, decreased attention to left and decreased motor planning, decreased initiation, decreased attention, decreased awareness, decreased problem solving, decreased safety awareness, decreased memory, and delayed processing, and decreased sitting balance, decreased standing balance, decreased postural control, hemiplegia, and decreased balance strategies.  Prior to hospitalization, patient could complete BADL/IADL/mobility with independent . ? ?Patient will benefit from skilled intervention to decrease level of assist with basic self-care skills and increase independence with basic self-care skills prior to discharge home with care partner.  Anticipate patient will require 24 hour supervision and minimal physical assistance and follow up home health. ? ?OT - End of Session ?Activity Tolerance: Tolerates 10 - 20 min activity with multiple rests ?Endurance Deficit: Yes ?OT Assessment ?Rehab Potential (ACUTE ONLY): Fair ?OT Barriers to Discharge: Decreased caregiver support;Inaccessible home environment;Home environment access/layout;Incontinence;Insurance for SNF coverage ?OT Patient demonstrates impairments in the following area(s): Balance;Behavior;Cognition;Endurance;Motor;Perception;Safety;Vision ?OT Basic ADL's Functional Problem(s): Grooming;Bathing;Dressing;Toileting ?OT Transfers Functional Problem(s): Toilet;Tub/Shower ?OT Additional Impairment(s): Fuctional Use of Upper Extremity ?OT Plan ?OT Intensity: Minimum of 1-2 x/day, 45 to 90 minutes ?OT Frequency: 5 out of 7 days ?OT Duration/Estimated Length of Stay: 3-3.5 weeks ?OT Treatment/Interventions: Balance/vestibular training;Disease mangement/prevention;Neuromuscular re-education;Self Care/advanced ADL retraining;Therapeutic Exercise;Wheelchair propulsion/positioning;UE/LE Strength taining/ROM;Skin care/wound managment;Pain management;DME/adaptive equipment instruction;Cognitive remediation/compensation;Community  reintegration;Functional electrical stimulation;Splinting/orthotics;UE/LE Coordination activities;Patient/family education;Discharge planning;Functional mobility training;Therapeutic Activities;Psychosocial support;Visual/perceptual remediation/compensation ?OT Self Feeding Anticipated Outcome(s): S ?OT Basic Self-Care Anticipated Outcome(s): S to min A ?OT Toileting Anticipated Outcome(s): min A ?OT Bathroom Transfers Anticipated Outcome(s): min A ?OT Recommendation ?Patient destination: Home ?Follow Up Recommendations: 24 hour supervision/assistance ?Equipment Recommended: To be determined ? ? ?OT Evaluation ?Precautions/Restrictions  ?Precautions ?Precautions: Fall ?Precaution Comments: [name], L weakness, L coordination deficit ?Restrictions ?Weight Bearing Restrictions: No ?General ?Chart Reviewed: Yes ?Response to Previous Treatment: Not applicable ?Family/Caregiver Present: No ? ?Pain ?Pain Assessment ?Pain Scale: 0-10 ?Pain Score: 0-No pain ?Home Living/Prior Functioning ?Home Living ?Living Arrangements: Spouse/significant other ?Available Help at Discharge: Family, Available 24 hours/day ?Type of Home: House ?Home Access: Stairs to enter ?Entrance Stairs-Number of Steps: 5 ?Entrance Stairs-Rails: Left, Right ?Home Layout: Multi-level, Able to live on main level with bedroom/bathroom ?Bathroom Shower/Tub: Walk-in shower ?Bathroom Toilet: Standard ?Additional Comments: supportive family, wife works full time ? Lives With: Spouse ?IADL History ?Homemaking Responsibilities: Yes ?Meal Prep Responsibility: Primary ?Laundry Responsibility: Primary ?Cleaning Responsibility: Primary ?Bill Paying/Finance Responsibility: Primary ?Shopping Responsibility: Primary ?Occupation: Retired ?Type of Occupation: government, [place] ?Leisure and Hobbies: fishing ?Prior Function ?Level of Independence: Independent with basic ADLs, Independent with homemaking with ambulation, Independent with gait, Independent with transfers ? Able  to Take Stairs?: Yes ?Driving: Yes ?Vocation: Retired ?Vision ?Baseline Vision/History: 1 Wears glasses (trifocal progressive glasses) ?Ability to See in Adequate Light: 1 Impaired ?Patient Visual Report: No change f

## 2022-02-27 NOTE — H&P (Signed)
?  ?Physical Medicine and Rehabilitation Admission H&P ?  ?  ?  ?HPI: Vernon [name] is a 76 year old right-handed male with history of chronic anemia, diabetes mellitus, hyperlipidemia, hypertension, atrial fibrillation maintained on Eliquis.  Per chart review patient lives with spouse.  Able to stay on main level with bed and bath.  Independent driving prior to admission and retired.  Wife works full-time.  Presented 02/10/2022 with left-sided weakness and incoordination as well as nausea vomiting.  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  Surrounding edema with mild effacement of the fourth ventricle.   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  Mild atherosclerosis without greater than 50% stenosis.  Eliquis was reversed with [name].  Patient underwent suboccipital craniectomy and evacuation of cerebellar hemorrhage.  Placement of right parieto-occipital ventriculostomy 02/11/2022 per Dr. Ellene Route.  Maintained on Keppra 14 days postoperatively ending 02/24/2022.  Follow-up CTs/MRI no hydrocephalus.  EVD was pulled 3/22.  Patient did require short-term intubation through 02/14/2022.  Postoperative course finding the left-sided pleural effusion and moderate to large right side pleural effusion and underwent thoracentesis with 1250 cc yield.  Latest chest x-ray showing no active disease.  Patient currently remains n.p.o. with nutritional tube feeds.  He was cleared to begin subcutaneous heparin for DVT prophylaxis 02/12/2022.  Neurology notes considering resuming AC at 4 weeks.  Bouts of restlessness and agitation with Seroquel added changed to Zyprexa via as well as the addition of amantadine with latest follow-up CT scan of the head 02/20/2022 showed no evidence of acute intracranial abnormality..  Palliative care therapy evaluations completed due to  patient decreased functional mobility left side weakness was admitted for a comprehensive rehab program. ?  ?Review of Systems  ?Constitutional:  Negative for chills and fever.  ?HENT:  Negative for hearing loss.   ?Eyes:  Negative for blurred vision and double vision.  ?Respiratory:  Negative for cough and shortness of breath.   ?Cardiovascular:  Positive for palpitations.  ?Gastrointestinal:  Positive for nausea and vomiting.  ?     GERD  ?Genitourinary:  Positive for urgency. Negative for dysuria, flank pain and hematuria.  ?Musculoskeletal:  Positive for myalgias.  ?Skin:  Negative for rash.  ?Neurological:  Positive for weakness.  ?All other systems reviewed and are negative. ?    ?Past Medical History:  ?Diagnosis Date  ? Anemia    ? Aortic stenosis    ? Arthritis    ? Complication of anesthesia    ?  hard time getting bp up after knee replacement  ? Diabetes mellitus without complication ([place])    ? GERD (gastroesophageal reflux disease)    ?  occ tums prn  ? History of hiatal hernia    ? Hypertension    ? 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[place])    ?  ?     ?Past Surgical History:  ?Procedure Laterality Date  ? CARPAL TUNNEL RELEASE   2012  ? CRANIOTOMY N/A 02/10/2022  ?  Procedure: SUBOCCIPITAL CRANIECTOMY FOR EVACUATION OF CEREBELLAR HEMATOMA;  Surgeon: Kristeen Miss, MD;  Location: [place];  Service: Neurosurgery;  Laterality: N/A;  ? JOINT REPLACEMENT Right 2010  ? SHOULDER ARTHROSCOPY WITH ROTATOR CUFF REPAIR AND OPEN BICEPS TENODESIS Right 11/09/2019  ?  Procedure: RIGHT SHOULDER ARTHROSCOPY WITH SUBSCAPULARIS REPAIR, SUBACROMIAL DECOMPRESSION,MINI OPEN ROTATOR CUFF REPAIR;  Surgeon: Leim Fabry, MD;  Location: [facility];  Service: Orthopedics;  Laterality: Right;  ?  ?     ?Family History  ?Problem Relation Age of Onset  ? Cancer Maternal Uncle    ?  ?Social History:  reports that he quit smoking about 43 years ago. His smoking use included cigarettes. He has a 4.00 pack-year smoking history. He has never  used smokeless tobacco. He reports current alcohol use. He reports that he does not use drugs. ?Allergies: No Known Allergies ?      ?Medications Prior to Admission  ?Medication Sig Dispense Refill  ? acetaminophen (TYLENOL) 650 MG CR tablet Take 650 mg by mouth every 8 (eight) hours as needed for pain.      ? allopurinol (ZYLOPRIM) 300 MG tablet Take 300 mg by mouth at bedtime.       ? apixaban (ELIQUIS) 5 MG TABS tablet Take 5 mg by mouth 2 (two) times daily.       ? ferrous sulfate 325 (65 FE) MG EC tablet Take 325 mg by mouth daily.      ? lisinopril (ZESTRIL) 40 MG tablet Take 40 mg by mouth at bedtime.       ? metFORMIN (GLUCOPHAGE) 500 MG tablet Take 500 mg by mouth 2 (two) times daily with a meal.       ? Multiple Vitamins-Minerals (CENTRUM SILVER PO) Take 1 tablet by mouth daily.      ? simvastatin (ZOCOR) 80 MG tablet Take 40 mg by mouth daily at 6 PM.       ?  ?  ?  ?  ?Home: ?Home Living ?Family/patient expects to be discharged to:: Private residence ?Living Arrangements: Spouse/significant other ?Available Help at Discharge: Family, Available 24 hours/day ?Type of Home: House ?Home Access: Stairs to enter ?Entrance Stairs-Number of Steps: 5 ?Entrance Stairs-Rails: Left, Right ?Home Layout: Multi-level, Able to live on main level with bedroom/bathroom ?Bathroom Shower/Tub: Walk-in shower ?Bathroom Toilet: Standard ?Home Equipment: None ?Additional Comments: supportive family, wife wroks full time ? Lives With: Spouse ?  ?Functional History: ?Prior Function ?Prior Level of Function : Independent/Modified Independent, Working/employed ?Mobility Comments: no AD ?ADLs Comments: drives, retired ?  ?Functional Status:  ?Mobility: ?Bed Mobility ?Overal bed mobility: Needs Assistance ?Bed Mobility: Supine to Sit, Sit to Supine, Rolling ?Rolling: Total assist ?Supine to sit: Total assist, HOB elevated ?Sit to supine: Max assist ?General bed mobility comments: patient given opportunity and time to respond to  commands with patient having difficulty processing ?Transfers ?Overall transfer level: Needs assistance ?Equipment used: 2 person hand held assist ?Transfers: Sit to/from Stand ?Sit to Stand: Max assist, +2 physical assistance ?Bed to/from chair/wheelchair/BSC transfer type:: Step pivot ?Step pivot transfers: Mod assist, Max assist, +2 physical assistance (a third with chair) ?General transfer comment: Patient stood x4 from EOB with max assist +2 and able to lift each LE with assistance for weight shifting ?Ambulation/Gait ?General Gait Details: unable this date ?Pre-gait activities: began marching in place with maxA for weight shifting L/R and max directional verbal cues via bilat HHA. Pt then given RW to progress gait however pt with significant leaning and unable to pick up feet to ambulate, RN brough chair up behind patient ?  ?ADL: ?ADL ?Overall ADL's : Needs assistance/impaired ?Eating/Feeding: NPO ?Grooming: Wash/dry hands, Maximal assistance, Sitting ?Grooming Details (indicate cue type and reason): hand over hand sitting on EOB. ?Upper Body Dressing : Maximal assistance ?Upper Body Dressing Details (indicate cue type and reason): donned gown to cover back ?Functional mobility during ADLs: Total assistance, +  2 for physical assistance, +2 for safety/equipment ?General ADL Comments: Patient with decreased level of arousal and requiring more assistance with grooming tasks ?  ?Cognition: ?Cognition ?Overall Cognitive Status: Difficult to assess ?Arousal/Alertness: Awake/alert ?Orientation Level: Oriented to person, Disoriented to place, Disoriented to time, Disoriented to situation ?Year: 2023 ?Month: April ?Day of Week: Incorrect ?Attention: Focused, Sustained ?Focused Attention: Impaired ?Focused Attention Impairment: Verbal complex ?Memory: Impaired ?Memory Impairment: Retrieval deficit, Decreased recall of new information (Immediate: 5/5 with repetion x3; delayed: 2/5; with cues: 2/3) ?Awareness:  Impaired ?Awareness Impairment: Emergent impairment ?Problem Solving: Impaired ?Problem Solving Impairment: Verbal complex (Money: 1/3; time: 0/1) ?Executive Function: Sequencing, Organizing ?Organizing: Impaired ?Organizing

## 2022-02-27 NOTE — Progress Notes (Signed)
?                                                       PROGRESS NOTE ? ? ?Subjective/Complaints: ? ?Awake and alert , no c/o , oriented to person and hospital but "[facility]" ?SLP in room , swallow remains severely affected ?ROS- limited by cognition  ? ?Objective: ?  ?No results found. ?Recent Labs  ?  02/26/22 ?0236 02/27/22 ?3532  ?WBC 11.7* 10.5  ?HGB 8.7* 8.0*  ?HCT 26.7* 24.7*  ?PLT 359 342  ? ?Recent Labs  ?  02/26/22 ?0236 02/27/22 ?9924  ?NA 134* 139  ?K 4.7 4.9  ?CL 104 106  ?CO2 23 27  ?GLUCOSE 133* 175*  ?BUN 41* 41*  ?CREATININE 1.53* 1.69*  ?CALCIUM 8.5* 9.0  ? ? ?Intake/Output Summary (Last 24 hours) at 02/27/2022 0929 ?Last data filed at 02/26/2022 1823 ?Gross per 24 hour  ?Intake 0 ml  ?Output 0 ml  ?Net 0 ml  ?  ? ?  ? ?Physical Exam: ?Vital Signs ?Blood pressure (!) 118/56, pulse 63, temperature 98.1 ?F (36.7 ?C), resp. rate 14, height '5\' 9"'$  (1.753 m), weight 89.7 kg, SpO2 97 %. ? ? ? ?Assessment/Plan: ?1. Functional deficits which require 3+ hours per day of interdisciplinary therapy in a comprehensive inpatient rehab setting. ?Physiatrist is providing close team supervision and 24 hour management of active medical problems listed below. ?Physiatrist and rehab team continue to assess barriers to discharge/monitor patient progress toward functional and medical goals ? ?Care Tool: ? ?Bathing ?   ?   ?   ?  ?  ?Bathing assist Assist Level: Total Assistance - Patient < 25% ?  ?  ?Upper Body Dressing/Undressing ?Upper body dressing Upper body dressing/undressing activity did not occur (including orthotics): Safety/medical concerns ?What is the patient wearing?: [place] only ?   ?Upper body assist   ?   ?Lower Body Dressing/Undressing ?Lower body dressing ? ? ? Lower body dressing activity did not occur: Safety/medical concerns ?  ? ?  ? ?Lower body assist   ?   ? ?Toileting ?Toileting    ?Toileting assist   ?  ?  ?Transfers ?Chair/bed transfer ? ?Transfers assist ? Chair/bed transfer activity did  not occur: Safety/medical concerns ? ?  ?  ?  ?Locomotion ?Ambulation ? ? ?Ambulation assist ? ?   ? ?  ?  ?   ? ?Walk 10 feet activity ? ? ?Assist ?   ? ?  ?   ? ?Walk 50 feet activity ? ? ?Assist   ? ?  ?   ? ? ?Walk 150 feet activity ? ? ?Assist   ? ?  ?  ?  ? ?Walk 10 feet on uneven surface  ?activity ? ? ?Assist   ? ? ?  ?   ? ?Wheelchair ? ? ? ? ?Assist   ?  ?  ? ?  ?   ? ? ?Wheelchair 50 feet with 2 turns activity ? ? ? ?Assist ? ?  ?  ? ? ?   ? ?Wheelchair 150 feet activity  ? ? ? ?Assist ?   ? ? ?   ? ?Blood pressure (!) 118/56, pulse 63, temperature 98.1 ?F (36.7 ?C), resp. rate 14, height '5\' 9"'$  (1.753 m), weight 89.7 kg, SpO2 97 %. ? ?  Medical Problem List and Plan: ?1. 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  Status post suboccipital craniectomy evacuation of cerebellar hemorrhage with placement of parieto-occipital ventriculostomy 02/11/2022 ?            -patient may shower ?            -ELOS/Goals: 16-20 days, supervision to min assist goals with PT, OT and supervision with SLP ?2.  Antithrombotics: ?-DVT/anticoagulation:  Pharmaceutical: Heparin initiated 02/12/2022 ?            -antiplatelet therapy: N/A ?3. Pain Management: Tylenol as needed ?4. Mood/arousal:  ?-Amantadine 100 mg twice daily for arousal ?            -adjust timing  ?-check sleep chart ?-consider adjustment to shunt settings if no further improvement? ?-urine culture never done. Will recheck on admit ?            -antipsychotic agents: Zyprexa via 2.5 mg twice daily which seems to have helped with agitation ?5. Neuropsych: This patient is capable of making decisions on his own behalf. ?6. Skin/Wound Care: Routine skin checks ?7. Fluids/Electrolytes/Nutrition: Routine in and outs with follow-up chemistries ?8.  Postop large right-sided pleural effusion.  Status postthoracentesis with 1250 cc yield.  Latest chest x-ray unremarkable ?9.  Seizure prophylaxis.  Keppra 500 mg twice daily completed ?10.  Atrial  fibrillation.  Eliquis currently on hold.  Neurology considering resuming in approximately 4 weeks.  Cardiac rate controlled ?11.  Diabetes mellitus.  Hemoglobin A1c 6.2.  NovoLog 2 units every 4 hours, Semglee 30 units daily ?CBG (last 3)  ?Recent Labs  ?  02/27/22 ?3846 02/27/22 ?6599 02/27/22 ?3570  ?GLUCAP 113* 181* 198*  ?Fair control monitor  ? ?12.  Chronic anemia.  Follow-up CBC ?13  Hypertension.  Coreg 3.125 mg twice daily.  Monitor with increased mobility ?            4.3 bp controlled today ?14.  Dysphagia.  Currently NPO.  Nasogastric tube feeds.  Dietary and speech therapy follow-up ?            Repeat MBS  ?  ? ?LOS: ?1 days ?A FACE TO FACE EVALUATION WAS PERFORMED ? ?Vernon [name] ?02/27/2022, 9:29 AM  ? ? ? ?

## 2022-02-27 NOTE — Progress Notes (Signed)
Inpatient Rehabilitation Care Coordinator ?Assessment and Plan ?Patient Details  ?Name: Vernon [name] ?MRN: [id number] ?Date of Birth: [birth year] ? ?Today's Date: 02/27/2022 ? ?Hospital Problems: Principal Problem: ?  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 hemorrhage of brain ([place]) ? ?Past Medical History:  ?Past Medical History:  ?Diagnosis Date  ? Anemia   ? Aortic stenosis   ? Arthritis   ? Complication of anesthesia   ? hard time getting bp up after knee replacement  ? Diabetes mellitus without complication ([place])   ? GERD (gastroesophageal reflux disease)   ? occ tums prn  ? History of hiatal hernia   ? Hypertension   ? 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[place])   ? ?Past Surgical History:  ?Past Surgical History:  ?Procedure Laterality Date  ? CARPAL TUNNEL RELEASE  2012  ? CRANIOTOMY N/A 02/10/2022  ? Procedure: SUBOCCIPITAL CRANIECTOMY FOR EVACUATION OF CEREBELLAR HEMATOMA;  Surgeon: Kristeen Miss, MD;  Location: [place];  Service: Neurosurgery;  Laterality: N/A;  ? JOINT REPLACEMENT Right 2010  ? SHOULDER ARTHROSCOPY WITH ROTATOR CUFF REPAIR AND OPEN BICEPS TENODESIS Right 11/09/2019  ? Procedure: RIGHT SHOULDER ARTHROSCOPY WITH SUBSCAPULARIS REPAIR, SUBACROMIAL DECOMPRESSION,MINI OPEN ROTATOR CUFF REPAIR;  Surgeon: Leim Fabry, MD;  Location: [facility];  Service: Orthopedics;  Laterality: Right;  ? ?Social History:  reports that he quit smoking about 43 years ago. His smoking use included cigarettes. He has a 4.00 pack-year smoking history. He has never used smokeless tobacco. He reports current alcohol use. He reports that he does not use drugs. ? ?Family / Support Systems ?Marital Status: Married ?Spouse/Significant Other: Roena Malady ?Children: Charlett Nose ?Anticipated Caregiver: Niklas Chretien ?Ability/Limitations of Caregiver: Min A (runs a buisness) ?Caregiver Availability: 24/7 ?Family Dynamics: support from spouse, children and other family ? ?Social History ?Preferred language: English ?Religion: Christian ?Health Literacy - How often  do you need to have someone help you when you read instructions, pamphlets, or other written material from your doctor or pharmacy?: Never ?Writes: Yes ?Employment Status: Retired ?Legal History/Current Legal Issues: n/a ?Guardian/Conservator: n/a  ? ?Abuse/Neglect ?Abuse/Neglect Assessment Can Be Completed: Unable to assess, patient is non-responsive or altered mental status ? ?Patient response to: ?Social Isolation - How often do you feel lonely or isolated from those around you?: Patient unable to respond ? ?Emotional Status ?Recent Psychosocial Issues: coping ?Psychiatric History: n/a ?Substance Abuse History: n/a ? ?Patient / Family Perceptions, Expectations & Goals ?Pt/Family understanding of illness & functional limitations: yes ?Premorbid pt/family roles/activities: patient previously independent overall and farming ?Anticipated changes in roles/activities/participation: spouse works FT able to assist with some roles and task ?Pt/family expectations/goals: supervision/Min A ? ?Community Resources ?Community Agencies: None ?Premorbid Home Care/DME Agencies: None ?Transportation available at discharge: spouse or children able to transport ?Is the patient able to respond to transportation needs?: Yes ?In the past 12 months, has lack of transportation kept you from medical appointments or from getting medications?: No ?In the past 12 months, has lack of transportation kept you from meetings, work, or from getting things needed for daily living?: No ?Resource referrals recommended: Neuropsychology ? ?Discharge Planning ?Living Arrangements: Spouse/significant other ?Support Systems: Spouse/significant other, Children ?Type of Residence: Private residence ?Insurance Resources: Multimedia programmer (specify) ?Financial Resources: [employer], Social Security ?Financial Screen Referred: No ?Living Expenses: Own ?Money Management: Patient, Spouse ?Does the patient have any problems obtaining your medications?:  No ?Home Management: independent ?Patient/Family Preliminary Plans: spouse or children able to assist ?Care Coordinator Barriers to Discharge: Insurance for SNF coverage, Lack of/limited family support, Decreased caregiver  support ?Care Coordinator Barriers to Discharge Comments: IV, [place] ?Care Coordinator Anticipated Follow Up Needs: HH/OP ?Expected length of stay: 16-18 days ? ?Clinical Impression ?SW spoke with patient spouse, introduced self and explained role. Spouse would like to complete MPOA while patient is on CIR, sw will inform RN to place order. 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 SW will reach out to [place] to verify benefits. No additional questions or concerns, sw will follow up with patient spouse on Wednesday.  ? ?Dyanne Iha ?02/27/2022, 11:46 AM ? ?  ?

## 2022-02-27 NOTE — Progress Notes (Signed)
Inpatient Rehabilitation  Patient information reviewed and entered into eRehab system by Bladen Umar M. Carling Liberman, M.A., CCC/SLP, PPS Coordinator.  Information including medical coding, functional ability and quality indicators will be reviewed and updated through discharge.    

## 2022-02-27 NOTE — Plan of Care (Signed)
?Problem: RH Balance ?Goal: LTG: Patient will maintain dynamic sitting balance (OT) ?Description: LTG:  Patient will maintain dynamic sitting balance with assistance during activities of daily living (OT) ?Flowsheets (Taken 02/27/2022 1508) ?LTG: Pt will maintain dynamic sitting balance during ADLs with: Supervision/Verbal cueing ?Goal: LTG Patient will maintain dynamic standing with ADLs (OT) ?Description: LTG:  Patient will maintain dynamic standing balance with assist during activities of daily living (OT)  ?Flowsheets (Taken 02/27/2022 1508) ?LTG: Pt will maintain dynamic standing balance during ADLs with: Contact Guard/Touching assist ?  ?Problem: Sit to Stand ?Goal: LTG:  Patient will perform sit to stand in prep for activites of daily living with assistance level (OT) ?Description: LTG:  Patient will perform sit to stand in prep for activites of daily living with assistance level (OT) ?Flowsheets (Taken 02/27/2022 1508) ?LTG: PT will perform sit to stand in prep for activites of daily living with assistance level: Contact Guard/Touching assist ?  ?Problem: RH Grooming ?Goal: LTG Patient will perform grooming w/assist,cues/equip (OT) ?Description: LTG: Patient will perform grooming with assist, with/without cues using equipment (OT) ?Flowsheets (Taken 02/27/2022 1508) ?LTG: Pt will perform grooming with assistance level of: Supervision/Verbal cueing ?  ?Problem: RH Bathing ?Goal: LTG Patient will bathe all body parts with assist levels (OT) ?Description: LTG: Patient will bathe all body parts with assist levels (OT) ?Flowsheets (Taken 02/27/2022 1508) ?LTG: Pt will perform bathing with assistance level/cueing: Minimal Assistance - Patient > 75% ?  ?Problem: RH Dressing ?Goal: LTG Patient will perform upper body dressing (OT) ?Description: LTG Patient will perform upper body dressing with assist, with/without cues (OT). ?Flowsheets (Taken 02/27/2022 1508) ?LTG: Pt will perform upper body dressing with assistance level of:  Supervision/Verbal cueing ?Goal: LTG Patient will perform lower body dressing w/assist (OT) ?Description: LTG: Patient will perform lower body dressing with assist, with/without cues in positioning using equipment (OT) ?Flowsheets (Taken 02/27/2022 1508) ?LTG: Pt will perform lower body dressing with assistance level of: Minimal Assistance - Patient > 75% ?  ?Problem: RH Toileting ?Goal: LTG Patient will perform toileting task (3/3 steps) with assistance level (OT) ?Description: LTG: Patient will perform toileting task (3/3 steps) with assistance level (OT)  ?Flowsheets (Taken 02/27/2022 1508) ?LTG: Pt will perform toileting task (3/3 steps) with assistance level: Minimal Assistance - Patient > 75% ?  ?Problem: RH Vision ?Goal: RH LTG Vision (Specify) ?Flowsheets (Taken 02/27/2022 1508) ?LTG: Vision Goals: Pt will locate all desired ADL items on his L during morning routine with no more than min VCs. ?  ?Problem: RH Functional Use of Upper Extremity ?Goal: LTG Patient will use RT/LT upper extremity as a (OT) ?Description: LTG: Patient will use right/left upper extremity as a stabilizer/gross assist/diminished/nondominant/dominant level with assist, with/without cues during functional activity (OT) ?Flowsheets (Taken 02/27/2022 1508) ?LTG: Use of upper extremity in functional activities: LUE as gross assist level ?LTG: Pt will use upper extremity in functional activity with assistance level of: Supervision/Verbal cueing ?  ?Problem: RH Toilet Transfers ?Goal: LTG Patient will perform toilet transfers w/assist (OT) ?Description: LTG: Patient will perform toilet transfers with assist, with/without cues using equipment (OT) ?Flowsheets (Taken 02/27/2022 1508) ?LTG: Pt will perform toilet transfers with assistance level of: Minimal Assistance - Patient > 75% ?  ?Problem: RH Tub/Shower Transfers ?Goal: LTG Patient will perform tub/shower transfers w/assist (OT) ?Description: LTG: Patient will perform tub/shower transfers with  assist, with/without cues using equipment (OT) ?Flowsheets (Taken 02/27/2022 1508) ?LTG: Pt will perform tub/shower stall transfers with assistance level of: Minimal Assistance -  Patient > 75% ?  ?Problem: RH Attention ?Goal: LTG Patient will demonstrate this level of attention during functional activites (OT) ?Description: LTG:  Patient will demonstrate this level of attention during functional activites  (OT) ?Flowsheets (Taken 02/27/2022 1508) ?Patient will demonstrate this level of attention during functional activites: Sustained ?Patient will demonstrate above attention level in the following environment: Controlled ?LTG: Patient will demonstrate this level of attention during functional activites (OT): Supervision ?  ?

## 2022-02-27 NOTE — Progress Notes (Signed)
Initial Nutrition Assessment ? ?DOCUMENTATION CODES:  ? ?Not applicable ? ?INTERVENTION:  ?Increase Osmolite 1.5 cal formula via [name] NGT to new goal rate of 70 ml/hr x 20 hours (may hold TF for up to 4 hours for therapy) ? ?Provide 45 ml Prosource TF BID per tube. ? ?Free water flushes of 200 ml q 4 hours per tube.  ? ?Tube feeding regimen to provide 2180 kcal, 110 grams of protein, and 2264 ml free water.  ? ?NUTRITION DIAGNOSIS:  ? ?Inadequate oral intake related to inability to eat as evidenced by NPO status. ? ?GOAL:  ? ?Patient will meet greater than or equal to 90% of their needs ? ?MONITOR:  ? ?TF tolerance, Skin, I & O's, Weight trends, Labs ? ?REASON FOR ASSESSMENT:  ? ?Consult ?Enteral/tube feeding initiation and management ? ?ASSESSMENT:  ? ?76 year old right-handed male with history of chronic anemia, diabetes mellitus, hyperlipidemia, hypertension. Presented 02/10/2022 with left-sided weakness and incoordination as well as nausea vomiting. 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 Pt with functional deficits secondary to intraparenchymal hemorrhage admitted to CIR. ? ?Pt asleep during time of visit and did not awaken to RD assessment. Pt with [name] in place with tube feeds infusing. RD to modify tube feeding orders to infuse for 20 hours a day for anticipation that tube feeds may be held for up to 4 hours for therapy.  ? ?NUTRITION - FOCUSED PHYSICAL EXAM: ? ?Flowsheet Row Most Recent Value  ?Orbital Region Unable to assess  ?Upper Arm Region No depletion  ?Thoracic and Lumbar Region No depletion  ?Buccal Region Unable to assess  ?Temple Region No depletion  ?Clavicle Bone Region Mild depletion  ?Clavicle and Acromion Bone Region Mild depletion  ?Scapular Bone Region Unable to assess  ?Dorsal Hand Unable to assess  ?Patellar  Region No depletion  ?Anterior Thigh Region No depletion  ?Posterior Calf Region No depletion  ?Edema (RD Assessment) None  ?Hair Reviewed  ?Eyes Unable to assess  ?Mouth Unable to assess  ?Skin Reviewed  ?Nails Unable to assess  ? ?  ? ?Labs and medications reviewed.  ? ?Diet Order:   ?Diet Order   ? ?       ?  Diet NPO time specified  Diet effective now       ?  ? ?  ?  ? ?  ? ? ?EDUCATION NEEDS:  ? ?Not appropriate for education at this time ? ?Skin:  Skin Assessment: Skin Integrity Issues: ?Skin Integrity Issues:: Incisions ?Incisions: head ? ?Last BM:  4/3 ? ?Height:  ? ?Ht Readings from Last 1 Encounters:  ?02/26/22 '5\' 9"'$  (1.753 m)  ? ? ?Weight:  ? ?Wt Readings from Last 1 Encounters:  ?02/26/22 89.7 kg  ? ?BMI:  Body mass index is 29.2 kg/m?. ? ?Estimated Nutritional Needs:  ? ?Kcal:  2100-2300 ? ?Protein:  105-120 grams ? ?Fluid:  >/= 2 L/day ? ?Corrin Parker, MS, RD, LDN ?RD pager number/after hours weekend pager number on Amion. ? ?

## 2022-02-27 NOTE — Plan of Care (Signed)
?  Problem: RH Swallowing ?Goal: LTG Patient will consume least restrictive diet using compensatory strategies with assistance (SLP) ?Description: LTG:  Patient will consume least restrictive diet using compensatory strategies with assistance (SLP) ?Flowsheets (Taken 02/27/2022 1002) ?LTG: Pt Patient will consume least restrictive diet using compensatory strategies with assistance of (SLP): Supervision ?Goal: LTG Pt will demonstrate functional change in swallow as evidenced by bedside/clinical objective assessment (SLP) ?Description: LTG: Patient will demonstrate functional change in swallow as evidenced by bedside/clinical objective assessment (SLP) ?Flowsheets (Taken 02/27/2022 1002) ?LTG: Patient will demonstrate functional change in swallow as evidenced by bedside/clinical objective assessment: Oropharyngeal swallow ?  ?Problem: RH Cognition - SLP ?Goal: RH LTG Patient will demonstrate orientation with cues ?Description:  LTG:  Patient will demonstrate orientation to person/place/time/situation with cues (SLP)   ?Flowsheets (Taken 02/27/2022 1002) ?LTG Patient will demonstrate orientation to: ? Person ? Place ? Time ? Situation ?LTG: Patient will demonstrate orientation using cueing (SLP): Supervision ?  ?Problem: RH Expression Communication ?Goal: LTG Patient will increase speech intelligibility (SLP) ?Description: LTG: Patient will increase speech intelligibility at word/phrase/conversation level with cues, % of the time (SLP) ?Flowsheets (Taken 02/27/2022 1002) ?LTG: Patient will increase speech intelligibility (SLP): Supervision ?Percent of time patient will use intelligible speech: 90% ?  ?Problem: RH Problem Solving ?Goal: LTG Patient will demonstrate problem solving for (SLP) ?Description: LTG:  Patient will demonstrate problem solving for basic/complex daily situations with cues  (SLP) ?Flowsheets (Taken 02/27/2022 1002) ?LTG: Patient will demonstrate problem solving for (SLP): Basic daily situations ?LTG Patient  will demonstrate problem solving for: Supervision ?  ?Problem: RH Memory ?Goal: LTG Patient will follow step by step directions w/cues (SLP) ?Description: LTG: Patient will follow step by step directions with cues (SLP). ?Flowsheets (Taken 02/27/2022 1002) ?LTG: Patient will follow step by step directions: ? 3 steps ? 4 steps ?LTG: Patient will follow step by step directions w/cues: Supervision ?Goal: LTG Patient will use memory compensatory aids to (SLP) ?Description: LTG:  Patient will use memory compensatory aids to recall biographical/new, daily complex information with cues (SLP) ?Flowsheets (Taken 02/27/2022 1002) ?LTG: Patient will use memory compensatory aids to (SLP): Supervision ?  ?Problem: RH Attention ?Goal: LTG Patient will demonstrate this level of attention during functional activites (SLP) ?Description: LTG:  Patient will will demonstrate this level of attention during functional activites (SLP) ?Flowsheets (Taken 02/27/2022 1002) ?Patient will demonstrate during cognitive/linguistic activities the attention type of: Sustained ?Patient will demonstrate this level of attention during cognitive/linguistic activities in: Home ?LTG: Patient will demonstrate this level of attention during cognitive/linguistic activities with assistance of (SLP): Supervision ?  ?

## 2022-02-27 NOTE — Progress Notes (Signed)
Inpatient Rehabilitation Center ?Individual Statement of Services ? ?Patient Name:  Vernon [name]  ?Date:  02/27/2022 ? ?Welcome to the [facility].  Our goal is to provide you with an individualized program based on your diagnosis and situation, designed to meet your specific needs.  With this comprehensive rehabilitation program, you will be expected to participate in at least 3 hours of rehabilitation therapies Monday-Friday, with modified therapy programming on the weekends. ? ?Your rehabilitation program will include the following services:  Physical Therapy (PT), Occupational Therapy (OT), Speech Therapy (ST), 24 hour per day rehabilitation nursing, Therapeutic Recreaction (TR), Neuropsychology, Care Coordinator, Rehabilitation Medicine, Nutrition Services, and Pharmacy Services ? ?Weekly team conferences will be held on Wednesdays to discuss your progress.  Your Inpatient Rehabilitation Care Coordinator will talk with you frequently to get your input and to update you on team discussions.  Team conferences with you and your family in attendance may also be held. ? ?Expected length of stay:  16-18 Days  Overall anticipated outcome:  Supervision/Min A ? ?Depending on your progress and recovery, your program may change. Your Inpatient Rehabilitation Care Coordinator will coordinate services and will keep you informed of any changes. Your Inpatient Rehabilitation Care Coordinator's name and contact numbers are listed  below. ? ?The following services may also be recommended but are not provided by the [facility]:  ? ?Home Health Rehabiltiation Services ?Outpatient Rehabilitation Services ? ?  ?Arrangements will be made to provide these services after discharge if needed.  Arrangements include referral to agencies that provide these services. ? ?Your insurance has been verified to be:   [facility] MEDICARE ?Your primary doctor is:  Harrel Lemon, MD ? ?Pertinent information will be  shared with your doctor and your insurance company. ? ?Inpatient Rehabilitation Care Coordinator:  Erlene Quan, [place] or ([phone] ? ?Information discussed with and copy given to patient by: Dyanne Iha, 02/27/2022, 11:33 AM    ?

## 2022-02-27 NOTE — Care Management (Signed)
Patient is active with the [facility]. [phone] ? ?PCP: Dr Loma [place] ?[place] [phone] Ext: [phone] ? ?Per spouse patient was active during the [place] War so he should be service connected.  ?CM has left 2 voicemails for the SW to see what patient may qualify for after a rehab stay.  ? ?

## 2022-02-27 NOTE — Discharge Instructions (Addendum)
Inpatient Rehab Discharge Instructions ? ?[name] ?Discharge date and time: 03/20/2022 ? ?Activities/Precautions/ Functional Status: ?Activity: activity as tolerated and no lifting, driving, or strenuous exercise until cleared by MD ?Diet: Mechanical soft, with diabetic restrictions. Drink plenty of water to flush your kidneys.  ?Wound Care: Routine skin checks ? ? ?Functional status:  ?___ No restrictions     ___ Walk up steps independently ?_X__ 24/7 supervision/assistance   ___ Walk up steps with assistance ?___ Intermittent supervision/assistance  ___ Bathe/dress independently ?___ Walk with walker     _X__ Bathe/dress with assistance ?___ Walk Independently    ___ Shower independently ?___ Walk with assistance    ___ Shower with assistance ?_X__ No alcohol     ___ Return to work/school ________ ? ? ?COMMUNITY REFERRALS UPON DISCHARGE:   ?Home Care:  RN                 Agency: [facility] Phone: [phone] ? ?Home Health: PT OT SLP Agency: [facility] Phone: [phone] ? ? ?Medical Equipment/Items Ordered: Bedside Commode and Wheelchair  ?                                                Agency/Supplier: [facility] [phone] ? ? ?Special Instructions: ?No driving , smoking or alcohol ?2. Monitor blood sugars 2-4 times a day before meals and/or at bedtime ?3. Can use Premier protein or other low carb supplements 3-4 times a day if you are not hungry or do not want to expend energy for a meal.  ? ? ? ?My questions have been answered and I understand these instructions. I will adhere to these goals and the provided educational materials after my discharge from the hospital. ? ?Patient/Caregiver Signature _______________________________ Date __________ ? ?Clinician Signature _______________________________________ Date __________ ? ?Please bring this form and your medication list with you to all your follow-up doctor's appointments.   ?

## 2022-02-27 NOTE — Evaluation (Signed)
Speech Language Pathology Assessment and Plan ? ?Patient Details  ?Name: Vernon [name] ?MRN: [id number] ?Date of Birth: 1946-03-07 ? ?SLP Diagnosis: Dysarthria;Cognitive Impairments;Dysphagia;Speech and Language deficits  ?Rehab Potential: Good ?ELOS: 3-3.5 weeks ? ?Today's Date: 02/27/2022 ?SLP Individual Time: 4193-7902 ?SLP Individual Time Calculation (min): 59 min ? ?Hospital Problem: Principal Problem: ?  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 hemorrhage of brain ([place]) ? ?Past Medical History:  ?Past Medical History:  ?Diagnosis Date  ? Anemia   ? Aortic stenosis   ? Arthritis   ? Complication of anesthesia   ? hard time getting bp up after knee replacement  ? Diabetes mellitus without complication ([place])   ? GERD (gastroesophageal reflux disease)   ? occ tums prn  ? History of hiatal hernia   ? Hypertension   ? 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 ?Past Surgical History:  ?Past Surgical History:  ?Procedure Laterality Date  ? CARPAL TUNNEL RELEASE  2012  ? CRANIOTOMY N/A 02/10/2022  ? Procedure: SUBOCCIPITAL CRANIECTOMY FOR EVACUATION OF CEREBELLAR HEMATOMA;  Surgeon: Kristeen Miss, MD;  Location: [facility];  Service: Neurosurgery;  Laterality: N/A;  ? JOINT REPLACEMENT Right 2010  ? SHOULDER ARTHROSCOPY WITH ROTATOR CUFF REPAIR AND OPEN BICEPS TENODESIS Right 11/09/2019  ? Procedure: RIGHT SHOULDER ARTHROSCOPY WITH SUBSCAPULARIS REPAIR, SUBACROMIAL DECOMPRESSION,MINI OPEN ROTATOR CUFF REPAIR;  Surgeon: Leim Fabry, MD;  Location: [facility];  Service: Orthopedics;  Laterality: Right;  ? ? ?Assessment / Plan / Recommendation ?Clinical Impression  Patient is a 76 y.o. year old male with history of chronic anemia, diabetes mellitus, hyperlipidemia, hypertension, atrial fibrillation maintained on Eliquis.  Per chart review patient lives with spouse.  Able to stay on main level with bed and bath.  Independent driving prior to admission and retired.  Wife works full-time.  Presented 02/10/2022 with left-sided weakness and incoordination as well  as nausea vomiting.  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 Mild atherosclerosis without greater than 50% stenosis. Patient underwent suboccipital craniectomy and evacuation of cerebellar hemorrhage.  Placement of right parieto-occipital ventriculostomy 02/11/2022 per Dr. Ellene Route. Patient did require short-term intubation through 02/14/2022. Patient currently remains n.p.o. with nutritional tube feeds. Bouts of restlessness and agitation with latest follow-up CT scan of the head 02/20/2022 showed no evidence of acute intracranial abnormality. Palliative care therapy evaluations completed due to patient decreased functional mobility left side weakness was admitted for a comprehensive rehab program. Patient transferred to CIR on 02/26/2022. ? ?Pt presents with consistent s/s oropharyngeal dysphagia with variety of textures and consistencies during BSE. Pt demonstrating immediate cough with tsp thin, immediate and consistent throat clear with tsps NTL and delayed throat clear with tsps HTL. Pt with inconsistent delayed throat clear with puree, multiple swallows required for all PO trials indicating weak pharyngeal phase of swallow. Poor oral manipulation noted with trials of ice chip and regular solids, extended mastication with solids and poor bolus awareness/transit. Continue to recommend NPO at this time with [name] in place, meds via tube.  ? ?Pt presents with mild-mod dysarthria with speech intelligibility averaging 50% at word/simple phrase level. Pt wears full upper dentures and partial lowers at home, not present in hospital at this time and likely impacting speech intelligibility and mastication. Suspect fatigue and poor attention impacting intelligibility this AM as well. Pt automatic speech 100% accurate,  naming common items 100%, communicate basic wants/needs  WFL. Vocal intensity reduced and hoarse. ? ?Pt presents with severe cognitive impairments at this time, re-administered the SLUMS (previous score 8/24 on March 27) with a score of 1/24 which is significant decline and impairment. Only point earned for naming 6 animals/minute. Pt disoriented to biographical info, location, recent events and time, however able to state "I had a stroke" a few times during session. Pt knows he lives in [place], reports to MD he is currently "in [place]" then moments later tells SLP he is "in [place]" despite recent re-orientation from MD. Pt reports year is "1992" and pt is "52". Pt average sustained attention 1-2 minutes with verbal cues. Pt with decreased awareness of current impairments at this time, reporting to MD "I'm ready to go home", also noted with perseveration on movements and verbalizations. Pt will benefit from skilled ST with focus on cognition, swallow function and speech to increase safety and independence with daily living routine at home.  ? ?  ?Skilled Therapeutic Interventions          Pt participating in Bedside Swallow Evaluation, SLUMS assessment, and further non standardized assessment of speech, language and cognition. Please see above. ?  ?SLP Assessment ? Patient will need skilled Speech Lanaguage Pathology Services during CIR admission  ?  ?Recommendations ? SLP Diet Recommendations: Alternative means - temporary;NPO ?Medication Administration: Via alternative means ?Oral Care Recommendations: Oral care BID ?Patient destination: Home ?Follow up Recommendations: Home Health SLP;Outpatient SLP ?Equipment Recommended: To be determined  ?  ?SLP Frequency 3 to 5 out of 7 days   ?SLP Duration ? ?SLP Intensity ? ?SLP Treatment/Interventions 3-3.5 ? ?Minumum of 1-2 x/day, 30 to 90 minutes ? ?Cognitive remediation/compensation;Environmental controls;Internal/external aids;Speech/Language  facilitation;Therapeutic Exercise;Therapeutic Activities;Patient/family education;Functional tasks;Dysphagia/aspiration precaution training;Cueing hierarchy   ? ?Pain ?Pain Assessment ?Pain Scale: 0-10 ?Pain Score: 0-No pain ? ?Prior Functioning ?Cognitive/Linguistic Baseline: Within functional limits ?Type of Home: House ? Lives With: Spouse ?Available Help at Discharge: Family;Available 24 hours/day ?Vocation: Retired ? ?SLP Evaluation ?Cognition ?Overall Cognitive Status: Impaired/Different from baseline ?Arousal/Alertness: Awake/alert ?Orientation Level: Oriented to person ?Year: Other (Comment) (1992) ?Month:  (unable to state) ?Day of Week: Incorrect ?Attention: Focused;Sustained ?Focused Attention: Impaired ?Focused Attention Impairment: Verbal basic;Functional basic ?Sustained Attention: Impaired ?Sustained Attention Impairment: Verbal basic;Functional basic ?Memory: Impaired ?Memory Impairment: Retrieval deficit;Decreased recall of new information ?Awareness: Impaired ?Awareness Impairment: Emergent impairment ?Problem Solving: Impaired ?Problem Solving Impairment: Verbal basic;Functional basic ?Executive Function: Organizing ?Organizing: Impaired ?Organizing Impairment: Verbal complex ?Behaviors: Perseveration ?Safety/Judgment: Impaired  ?Comprehension ?Auditory Comprehension ?Overall Auditory Comprehension: Impaired ?Yes/No Questions: Impaired ?Basic Biographical Questions: 51-75% accurate ?Basic Immediate Environment Questions: 25-49% accurate ?Commands: Impaired ?One Step Basic Commands: 75-100% accurate ?Two Step Basic Commands: 50-74% accurate ?Conversation: Simple ?Interfering Components: Attention;Working memory;Processing speed ?EffectiveTechniques: Increased volume;Slowed speech;Stressing words ?Visual Recognition/Discrimination ?Discrimination: Not tested ?Reading Comprehension ?Reading Status: Not tested ?Expression ?Expression ?Primary Mode of Expression: Verbal ?Verbal Expression ?Overall  Verbal Expression: Appears within functional limits for tasks assessed ?Initiation: No impairment ?Automatic Speech: Day of week;Counting;Name ?Level of Generative/Spontaneous Verbalization: Phrase;Sentence ?Repetition: No

## 2022-02-28 DIAGNOSIS — Z66 Do not resuscitate: Secondary | ICD-10-CM

## 2022-02-28 DIAGNOSIS — Z7189 Other specified counseling: Secondary | ICD-10-CM

## 2022-02-28 DIAGNOSIS — Z515 Encounter for palliative care: Secondary | ICD-10-CM

## 2022-02-28 LAB — GLUCOSE, CAPILLARY
Glucose-Capillary: 207 mg/dL — ABNORMAL HIGH (ref 70–99)
Glucose-Capillary: 214 mg/dL — ABNORMAL HIGH (ref 70–99)
Glucose-Capillary: 208 mg/dL — ABNORMAL HIGH (ref 70–99)
Glucose-Capillary: 186 mg/dL — ABNORMAL HIGH (ref 70–99)
Glucose-Capillary: 111 mg/dL — ABNORMAL HIGH (ref 70–99)
Glucose-Capillary: 187 mg/dL — ABNORMAL HIGH (ref 70–99)

## 2022-02-28 NOTE — Progress Notes (Signed)
Occupational Therapy Session Note ? ?Patient Details  ?Name: Vernon [name] ?MRN: [id number] ?Date of Birth: 10/18/1946 ? ?Today's Date: 02/28/2022 ?OT Individual Time: 9373-4287 ?OT Individual Time Calculation (min): 58 min  ? ? ?Short Term Goals: ?Week 1:  OT Short Term Goal 1 (Week 1): Pt will bathe UB seated at sink with min A. ?OT Short Term Goal 2 (Week 1): Pt will complete > 2 grooming task at sink with S. ?OT Short Term Goal 3 (Week 1): Pt will complete sit to stand with min A in prep for standing ADL. ?OT Short Term Goal 4 (Week 1): Pt will complete toilet transfer with max A and LRAD. ? ?Skilled Therapeutic Interventions/Progress Updates:  ?  Pt received semi-reclined in bed, reports L knee soreness from bumping it into side of bed last night, agreeable to therapy. Session focus on self-care retraining, activity tolerance, LUE NMR, transfer retraining in prep for improved ADL/IADL/func mobility performance + decreased caregiver burden. Came to sitting EOB with max A to progress BLE bed and to lift trunk. Consistent min A due to posterior bias to maintain sitting balance initially. Total A to don pants, max A for sit to stand with RW, difficulty coming into fully upright posture due to flexed trunk/knees.  ? ?Stedy transfer > BSC over toilet with mod A to power up and maintain L hand on bar, LUE very ataxic and pt often batting at objects perseverately. Total A for toileting tasks but no void/no episode of incontinence. Total A w/c transport to and from gym. ? ?Pt completed 2x10 of the following with 2 lb dowel rod: forward/backward circles, chest press, B shoulder flexion and hold, biceps curls. Good sustained attention to task in busy gym environment, difficulty maintaining L grasp on bar. ? ? ?Pt left seated in w/c with telesitter on, safety belt alarm engaged, call bell in reach, and all immediate needs met.  ? ? ?Therapy Documentation ?Precautions:  ?Precautions ?Precautions: Fall ?Precaution Comments:  [name], L weakness, L coordination deficit ?Restrictions ?Weight Bearing Restrictions: No ? ?  ?Pain: ?  L knee soreness, no request for intervention at this time ?ADL: See Care Tool for more details. ?Therapy/Group: Individual Therapy ? ?Volanda Napoleon MS, OTR/L ? ?02/28/2022, 6:51 AM ?

## 2022-02-28 NOTE — Progress Notes (Signed)
Pt staples removed from back of head per orders. Pt tolerated well, pt denies any pain and has no other complaints. Wife and NP palliative Care at bedside discussing directives. Needs met at this time. ?

## 2022-02-28 NOTE — Progress Notes (Signed)
Physical Therapy Session Note ? ?Patient Details  ?Name: Vernon [name] ?MRN: [id number] ?Date of Birth: 07-Oct-1946 ? ?Today's Date: 02/28/2022 ?PT Individual Time: 1031-1100  ?41 mi ? ?Short Term Goals: ?Week 1:  PT Short Term Goal 1 (Week 1): Pt will perform bed mobility with consistent MinA. ?PT Short Term Goal 2 (Week 1): Pt will perform sit<>stand transfers with consistent ModA. ?PT Short Term Goal 3 (Week 1): Pt will perform stand pivot transfers with consistent ModA +1. ?PT Short Term Goal 4 (Week 1): Pt will initiate gait training. ?Week 2:    ? ?Skilled Therapeutic Interventions/Progress Updates:  ? ?Pt received sitting in WC and agreeable to PT. Transported to day room in [facility]. Kinetron reciprocal movement training 5 x 1 min with 1 min rest break. Min-mod assist throughout at the LLE to improved ROM of hip flexion and sustain attention to task.  ? ?Sit<>stand x 5 with mod assist and moderate cues for improved UE placement and anterior weight shift. Poor carryover of instruction to push from [facility] on the RUE.  ? ?Pregait stepping to target with UE support on RW performed x 3 bouts with success to initiate stepping x 6 BLE on the 3rd bout only. Mod assist from PT to prevent posterior LOB.   ? ?Patient returned to room and left sitting in [facility] with call bell in reach and all needs met.   ? ? ?   ? ?Therapy Documentation ?Precautions:  ?Precautions ?Precautions: Fall ?Precaution Comments: [name], L weakness, L coordination deficit ?Restrictions ?Weight Bearing Restrictions: No ? ?Vital Signs: ?Therapy Vitals ?Temp: 99.1 ?F (37.3 ?C) ?Pulse Rate: 62 ?Resp: 20 ?BP: (!) 125/55 ?Patient Position (if appropriate): Lying ?Oxygen Therapy ?SpO2: 95 % ?O2 Device: Room Air ?Pain: ?denies ? ? ? ?Therapy/Group: Individual Therapy ? ?Lorie Phenix ?02/28/2022, 5:56 PM  ?

## 2022-02-28 NOTE — Progress Notes (Signed)
Patient ID: Vernon [name], male   DOB: [birth year], 76 y.o.   MRN: [id number] ? ?[name] left VM for patient Vernon [name], Vernon [name] ? ?P: [phone] Ext. [phone] ?

## 2022-02-28 NOTE — Patient Care Conference (Signed)
Inpatient RehabilitationTeam Conference and Plan of Care Update ?Date: 02/28/2022   Time: 10:15 AM  ? ? ?Patient Name: Vernon [name]      ?Medical Record Number: [id number]  ?Date of Birth: 31-Jan-1946 ?Sex: Male         ?Room/Bed: 4W80H/2Z22Q-82 ?Payor Info: Payor: [employer] / Plan: [facility] MEDICARE / Product Type: *No Product type* /   ? ?Admit Date/Time:  02/26/2022  5:04 PM ? ?Primary Diagnosis:  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 hemorrhage of brain (HCC) ? ?Hospital Problems: Principal Problem: ?  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 hemorrhage of brain ([place]) ? ? ? ?Expected Discharge Date: Expected Discharge Date: 03/20/22 ? ?Team Members Present: ?Physician leading conference: Dr. Alysia Penna ?Social Worker Present: Erlene Quan, BSW ?Nurse Present: Dorien Chihuahua, RN ?PT Present: Barrie Folk, PT ?OT Present: Providence Lanius, OT ?SLP Present: Sherren Kerns, SLP ?PPS Coordinator present : Gunnar Fusi, SLP ? ?   Current Status/Progress Goal Weekly Team Focus  ?Bowel/Bladder ? ?   Incontinent   Incontinence managed   Toileting q 3 hours during the day, purewick at HS  ?Swallow/Nutrition/ Hydration ? ? NPO via [name]  Sup A for regular/thin  trials of HTL/puree with SLP only, repeat MBS when appropriate   ?ADL's ? ? Total A UB/LB dressing bathing at sink, toilet transfer mod in stedy, total A toileting tasks, poor initiation, LUE/hand brummstrom level III  S to min A, 24/7 S  LUE NMR, cog, self-care/transfer/balance retraining, pt/family/DME/ AE education   ?Mobility ? ? Bed mobility = Mod A; Sit <>stand only with Mod A, squat pivot = Mod A; no ambulation or stairs on eval; slow to initiate  Bed mobility with CGA, transfers with CGA, ambulation with CGA/ MinA  L hemibody NMR, standing balance, transfer and gait training, family education   ?Communication ? ? Min-Mod A for speech intelligiblity (depends on fatigue)  Supervision A  introduce intelligibility strategies for dysarthria   ?Safety/Cognition/ Behavioral  Observations ? max A  Supervision A  orientation, attention, functional problem solving   ?Pain ? ?   N/A        ?Skin ? ?   MASD scrotal area/periarea; scrotal blister   Skin healing   Monitor healing of MASD with care and healing of blister  ? ? ?Discharge Planning:  ?Discharging home with assistance from spouse able to provide 24/7   ?Team Discussion: ?Patient incontinent of bowel and bladder, cortrak with continuous feeds and free H2O. Sleep chart notes sleeping well but shows fatigue after therapy sessions. Wife has questions about care and concerns about CODE status; palliative care re-consulted and chaplain consult added. ? ?Patient on target to meet rehab goals: ?yes, currently needs total assist for upper body care and lower body care at the sink. Needs mod  - max assist for toileting and transfers dur to poor initiation, left upper extremity ataxia and deficits with orientation, problem solving , attention and balance. Able to complete sit - stand with mod assist. Goals for discharge set for supervision overall with CGA for transfers. ? ?*See Care Plan and progress notes for long and short-term goals.  ? ?Revisions to Treatment Plan:  ?SLP downgraded goals to supervision ?MBS pending; po trial puree foods and nectar thick liquid with SLP ?  ?Teaching Needs: ?Safety, nutritional means, medications, dietary modifications, transfers, toileting, etc.  ?Current Barriers to Discharge: ?Decreased caregiver support, Home enviroment access/layout, and Incontinence ? ?Possible Resolutions to Barriers: ?Family education ?  ? ? Medical Summary ?Current Status: NPO on TF,confdused ataxic ?  Barriers to Discharge: Nutrition means ?  ?Possible Resolutions to [employer] Focus: May need repeat MBS to assess need for PEG ? ? ?Continued Need for Acute Rehabilitation Level of Care: The patient requires daily medical management by a physician with specialized training in physical medicine and rehabilitation for the  following reasons: ?Direction of a multidisciplinary physical rehabilitation program to maximize functional independence : Yes ?Medical management of patient stability for increased activity during participation in an intensive rehabilitation regime.: Yes ?Analysis of laboratory values and/or radiology reports with any subsequent need for medication adjustment and/or medical intervention. : Yes ? ? ?I attest that I was present, lead the team conference, and concur with the assessment and plan of the team. ? ? ?[name] ?02/28/2022, 1:52 PM  ? ? ? ? ? ? ?

## 2022-02-28 NOTE — Plan of Care (Signed)
?  Problem: RH Balance ?Goal: LTG Patient will maintain dynamic sitting balance (PT) ?Description: LTG:  Patient will maintain dynamic sitting balance with assistance during mobility activities (PT) ?Flowsheets (Taken 02/28/2022 0548) ?LTG: Pt will maintain dynamic sitting balance during mobility activities with:: Supervision/Verbal cueing ?Goal: LTG Patient will maintain dynamic standing balance (PT) ?Description: LTG:  Patient will maintain dynamic standing balance with assistance during mobility activities (PT) ?Flowsheets (Taken 02/28/2022 0548) ?LTG: Pt will maintain dynamic standing balance during mobility activities with:: Contact Guard/Touching assist ?  ?Problem: Sit to Stand ?Goal: LTG:  Patient will perform sit to stand with assistance level (PT) ?Description: LTG:  Patient will perform sit to stand with assistance level (PT) ?Flowsheets (Taken 02/28/2022 0548) ?LTG: PT will perform sit to stand in preparation for functional mobility with assistance level: Contact Guard/Touching assist ?  ?Problem: RH Bed Mobility ?Goal: LTG Patient will perform bed mobility with assist (PT) ?Description: LTG: Patient will perform bed mobility with assistance, with/without cues (PT). ?Flowsheets (Taken 02/28/2022 0548) ?LTG: Pt will perform bed mobility with assistance level of: Supervision/Verbal cueing ?  ?Problem: RH Bed to Chair Transfers ?Goal: LTG Patient will perform bed/chair transfers w/assist (PT) ?Description: LTG: Patient will perform bed to chair transfers with assistance (PT). ?Flowsheets (Taken 02/28/2022 0548) ?LTG: Pt will perform Bed to Chair Transfers with assistance level: Contact Guard/Touching assist ?  ?Problem: RH Car Transfers ?Goal: LTG Patient will perform car transfers with assist (PT) ?Description: LTG: Patient will perform car transfers with assistance (PT). ?Flowsheets (Taken 02/28/2022 0548) ?LTG: Pt will perform car transfers with assist:: Contact Guard/Touching assist ?  ?Problem: RH Ambulation ?Goal:  LTG Patient will ambulate in controlled environment (PT) ?Description: LTG: Patient will ambulate in a controlled environment, # of feet with assistance (PT). ?Flowsheets (Taken 02/28/2022 0548) ?LTG: Pt will ambulate in controlled environ  assist needed:: Minimal Assistance - Patient > 75% ?LTG: Ambulation distance in controlled environment: 142f using LRAD ?Goal: LTG Patient will ambulate in home environment (PT) ?Description: LTG: Patient will ambulate in home environment, # of feet with assistance (PT). ?Flowsheets (Taken 02/28/2022 0548) ?LTG: Pt will ambulate in home environ  assist needed:: Minimal Assistance - Patient > 75% ?LTG: Ambulation distance in home environment: at least 50 ft using LRAD ?  ?Problem: RH Wheelchair Mobility ?Goal: LTG Patient will propel w/c in controlled environment (PT) ?Description: LTG: Patient will propel wheelchair in controlled environment, # of feet with assist (PT) ?Flowsheets (Taken 02/28/2022 0548) ?LTG: Pt will propel w/c in controlled environ  assist needed:: Contact Guard/Touching assist ?LTG: Propel w/c distance in controlled environment: 100 ft ?  ?Problem: RH Stairs ?Goal: LTG Patient will ambulate up and down stairs w/assist (PT) ?Description: LTG: Patient will ambulate up and down # of stairs with assistance (PT) ?Flowsheets (Taken 02/28/2022 0548) ?LTG: Pt will ambulate up/down stairs assist needed:: Minimal Assistance - Patient > 75% ?LTG: Pt will  ambulate up and down number of stairs: at least 6 steps with HR setup as per home environment ?  ?

## 2022-02-28 NOTE — Progress Notes (Signed)
Patient ID: Vernon [name], male   DOB: [birth year], 76 y.o.   MRN: [id number] ?Team Conference Report to Patient/Family ? ?Team Conference discussion was reviewed with the patient and caregiver, including goals, any changes in plan of care and target discharge date.  Patient and caregiver express understanding and are in agreement.  The patient has a target discharge date of 03/20/22. ? ?SW spoke with patient spoke and provide brief team conference updates. The patient spouse was meeting with palliative. No additional questions or concerns, sw will continue to follow up.  ? ?Dyanne Iha ?02/28/2022, 1:53 PM  ?

## 2022-02-28 NOTE — Progress Notes (Signed)
?                                                       PROGRESS NOTE ? ? ?Subjective/Complaints: ?Confused, incontinent, poor safety ?ROS- limited by cognition  ? ?Objective: ?  ?No results found. ?Recent Labs  ?  02/26/22 ?0236 02/27/22 ?7106  ?WBC 11.7* 10.5  ?HGB 8.7* 8.0*  ?HCT 26.7* 24.7*  ?PLT 359 342  ? ? ?Recent Labs  ?  02/26/22 ?0236 02/27/22 ?2694  ?NA 134* 139  ?K 4.7 4.9  ?CL 104 106  ?CO2 23 27  ?GLUCOSE 133* 175*  ?BUN 41* 41*  ?CREATININE 1.53* 1.69*  ?CALCIUM 8.5* 9.0  ? ? ? ?Intake/Output Summary (Last 24 hours) at 02/28/2022 1015 ?Last data filed at 02/28/2022 0200 ?Gross per 24 hour  ?Intake 0 ml  ?Output 300 ml  ?Net -300 ml  ? ?  ? ?  ? ?Physical Exam: ?Vital Signs ?Blood pressure 133/65, pulse 70, temperature 98.7 ?F (37.1 ?C), temperature source Oral, resp. rate 14, height '5\' 9"'$  (1.753 m), weight 89.7 kg, SpO2 93 %. ? ?ENT- Feeding tube left nares ?General: No acute distress ?Mood and affect are appropriate ?Heart: Regular rate and rhythm no rubs murmurs or extra sounds ?Lungs: Clear to auscultation, breathing unlabored, no rales or wheezes ?Abdomen: Positive bowel sounds, soft nontender to palpation, nondistended ?Extremities: No clubbing, cyanosis, or edema ?Skin: No evidence of breakdown, no evidence of rash ?Neurologic: Cranial nerves II through XII intact, motor strength is 5/5 in bilateral deltoid, bicep, tricep, grip, hip flexor, knee extensors, ankle dorsiflexor and plantar flexor ?Sensory exam normal sensation to light touch and proprioception in bilateral upper and lower extremities ?Cerebellar exam LUE ataxia ?Musculoskeletal: Full range of motion in all 4 extremities. No joint swelling ?Oriented to person only  ? ?Assessment/Plan: ?1. Functional deficits which require 3+ hours per day of interdisciplinary therapy in a comprehensive inpatient rehab setting. ?Physiatrist is providing close team supervision and 24 hour management of active medical problems listed below. ?Physiatrist  and rehab team continue to assess barriers to discharge/monitor patient progress toward functional and medical goals ? ?Care Tool: ? ?Bathing ? Bathing activity did not occur: Safety/medical concerns ?Body parts bathed by patient: Face  ? Body parts bathed by helper: Right arm, Chest, Left arm, Abdomen ?  ?  ?Bathing assist Assist Level: Total Assistance - Patient < 25% ?  ?  ?Upper Body Dressing/Undressing ?Upper body dressing Upper body dressing/undressing activity did not occur (including orthotics): Safety/medical concerns ?What is the patient wearing?: Pull over shirt ?   ?Upper body assist Assist Level: Total Assistance - Patient < 25% ?   ?Lower Body Dressing/Undressing ?Lower body dressing ? ? ? Lower body dressing activity did not occur: Safety/medical concerns ?What is the patient wearing?: Incontinence brief ? ?  ? ?Lower body assist Assist for lower body dressing: Total Assistance - Patient < 25% ?   ? ?Toileting ?Toileting    ?Toileting assist Assist for toileting: Total Assistance - Patient < 25% ?  ?  ?Transfers ?Chair/bed transfer ? ?Transfers assist ? Chair/bed transfer activity did not occur: Safety/medical concerns ? ?Chair/bed transfer assist level: Dependent - Patient 0% ?  ?  ?Locomotion ?Ambulation ? ? ?Ambulation assist ? ? Ambulation activity did not occur: Safety/medical concerns ? ?  ?  ?   ? ?  Walk 10 feet activity ? ? ?Assist ? Walk 10 feet activity did not occur: Safety/medical concerns ? ?  ?   ? ?Walk 50 feet activity ? ? ?Assist Walk 50 feet with 2 turns activity did not occur: Safety/medical concerns ? ?  ?   ? ? ?Walk 150 feet activity ? ? ?Assist Walk 150 feet activity did not occur: Safety/medical concerns ? ?  ?  ?  ? ?Walk 10 feet on uneven surface  ?activity ? ? ?Assist Walk 10 feet on uneven surfaces activity did not occur: Safety/medical concerns ? ? ?  ?   ? ?Wheelchair ? ? ? ? ?Assist Is the patient using a wheelchair?: Yes ?Type of Wheelchair: Manual ?Wheelchair activity  did not occur: Safety/medical concerns ? ?  ?   ? ? ?Wheelchair 50 feet with 2 turns activity ? ? ? ?Assist ? ?  ?Wheelchair 50 feet with 2 turns activity did not occur: Safety/medical concerns ? ? ?   ? ?Wheelchair 150 feet activity  ? ? ? ?Assist ? Wheelchair 150 feet activity did not occur: Safety/medical concerns ? ? ?   ? ?Blood pressure 133/65, pulse 70, temperature 98.7 ?F (37.1 ?C), temperature source Oral, resp. rate 14, height '5\' 9"'$  (1.753 m), weight 89.7 kg, SpO2 93 %. ? ?Medical Problem List and Plan: ?1. 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  Status post suboccipital craniectomy evacuation of cerebellar hemorrhage with placement of parieto-occipital ventriculostomy 02/11/2022 ?            -patient may shower ?            -ELOS/Goals: 16-20 days, supervision to min assist goals with PT, OT and supervision with SLP ?2.  Antithrombotics: ?-DVT/anticoagulation:  Pharmaceutical: Heparin initiated 02/12/2022 ?            -antiplatelet therapy: N/A ?3. Pain Management: Tylenol as needed ?4. Mood/arousal:  ?-Amantadine 100 mg twice daily for arousal ?            -adjust timing  ?-check sleep chart ?-consider adjustment to shunt settings if no further improvement? ?-urine culture never done. Will recheck on admit ?            -antipsychotic agents: Zyprexa via 2.5 mg twice daily which seems to have helped with agitation ?5. Neuropsych: This patient is capable of making decisions on his own behalf. ?6. Skin/Wound Care: Routine skin checks ?7. Fluids/Electrolytes/Nutrition: Routine in and outs with follow-up chemistries ?8.  Postop large right-sided pleural effusion.  Status postthoracentesis with 1250 cc yield.  Latest chest x-ray unremarkable ?9.  Seizure prophylaxis.  Keppra 500 mg twice daily completed ?10.  Atrial fibrillation.  Eliquis currently on hold.  Neurology considering resuming in approximately 4 weeks.  Cardiac rate controlled ?11.  Diabetes mellitus.  Hemoglobin A1c 6.2.   NovoLog 2 units every 4 hours, Semglee 30 units daily ?CBG (last 3)  ?Recent Labs  ?  02/28/22 ?8563 02/28/22 ?0602 02/28/22 ?1497  ?GLUCAP 207* 214* 208*  ? ?Fair control increase Semglee ? ?12.  Chronic anemia.  Follow-up CBC ?13  Hypertension.  Coreg 3.125 mg twice daily.  Monitor with increased mobility ?          ?Vitals:  ? 02/27/22 1353 02/27/22 2050  ?BP: (!) 111/59 133/65  ?Pulse: 63 70  ?Resp: 17 14  ?Temp: 97.7 ?F (36.5 ?C) 98.7 ?F (37.1 ?C)  ?SpO2: 97% 93%  ?  ?14.  Dysphagia.  Currently NPO.  Nasogastric tube feeds.  Dietary and speech therapy follow-up ?            Repeat MBS per SLP ?  ? ?LOS: ?2 days ?A FACE TO FACE EVALUATION WAS PERFORMED ? ?Luanna Salk Fedrick Cefalu ?02/28/2022, 10:15 AM  ? ? ? ?

## 2022-02-28 NOTE — Progress Notes (Signed)
Speech Language Pathology Daily Session Note ? ?Patient Details  ?Name: Vernon [name] ?MRN: [id number] ?Date of Birth: 1946/01/07 ? ?Today's Date: 02/28/2022 ?SLP Individual Time: 8413-2440 ?SLP Individual Time Calculation (min): 66 min ? ?Short Term Goals: ?Week 1: SLP Short Term Goal 1 (Week 1): Pt will participate in PO trials with SLP only (HTL and puree) with minimal s/s aspiration provided mod A multimodal cues for swallow strategies ?SLP Short Term Goal 2 (Week 1): Pt will increase orientation to biographical info, place, time and recent events provided mod A multimodal cues ?SLP Short Term Goal 3 (Week 1): Pt will increase speech intelligibility at phrase/simple sentence level to 80% with mod A multimodal cues ?SLP Short Term Goal 4 (Week 1): Pt will solve functional problems with 50% accuracy provided mod A cues ?SLP Short Term Goal 5 (Week 1): Pt will increase sustained attention to functional tasks to 5-7 minutes provided mod A cues ?SLP Short Term Goal 6 (Week 1): Pt will follow 2-step directions during functional tasks with 70% accuracy min cues ? ?Skilled Therapeutic Interventions: Skilled ST treatment focused on cognitive-communication goals. Patient only oriented to person independent of cues; required max A multimodal cues to orient to place/time/situation and required max A verbal reinforcement to retain this information throughout session. Spouse present during session. When asked "who is this?" Pt stated "my brother-in-law" followed by other incorrect known individuals. Pt finally responded accurately when given field of choices. This was further beneficial to patient for answering biographical questions. Spouse reported patient was tired from previous therapy sessions which was evidenced by increasing fatigue as session progressed. Pt was perceived as 50% intelligible at the sentence level with max A to over-articulate, reduce speech rate, and increase vocal loudness. SLP facilitated oral care with  max A for thoroughness. SLP obtained items to initiate PO trials, however it was at this time patient requested to use the bathroom. SLP transferred patient from wheelchair to [facility] with max A to stand, max A for following 1-step commands, and max A to maintain positioning. Pt often fidgeting with pants and unable to maintain grasp on Stedy. Pt was continent of bowel and bladder and required total A for peri care; max A for pulling up pants. Pt was returned to bed at end of session with alarm activated and immediate needs within reach at end of session. Spouse at bedside. Continue per current plan of care.   ?  \ ?Pain ? None ? ?Therapy/Group: Individual Therapy ? ?Ysmael Hires T Nicholle Falzon ?02/28/2022, 3:50 PM ?

## 2022-02-28 NOTE — Progress Notes (Signed)
Palliative: ? 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  He required intubation for airway protection and underwent emergent craniotomy on 3/18 with hematoma evacuation and drain placement.   Palliative care has been asked to get involved to further discuss goals of care in the setting of ICH and ongoing/worsening encephalopathy. ? ?I met today at Vernon [name]'s bedside with wife, Vernon [name]. Vernon [name] shares that she and her children discussed over the weekend and she very much wants to ensure that he is DNR and does not want him to suffer. I reassured her that this is very appropriate and I will certainly put this in place according to their wishes. Vernon [name] shares that she has been able to have conversation with Vernon [name] when he is more alert and awake (more so in the morning - he is exhausted from therapy and currently sleeping). Vernon [name] and I had a long discussion regarding Vernon [name] as a person and what is important to him and expectations moving forward. Vernon [name] wants Vernon [name] to have all opportunity for improvement but also realistic and resigning herself that he will likely not have significant improvements. Quality of life is very important. We discussed the "what if" he does not improve and maintains in his current state. She tells me that family have had discussions and if this is the case they would prefer that he transition to hospice facility in [place]. We discussed hospice care and how this differs from current care. She agrees long term feeding tube in his current state would not be what he would want. We reviewed the importance of their faith giving them acceptance and peace and trusting God moving forward in whatever direction is meant to be. Vernon [name] finds much peace in knowing Vernon [name]'s strong faith and knowing if he does approach end of life he will be going to heaven.  For now, plan is to continue with therapy to give Vernon [name] every opportunity to improve as much as possible. Family is wife in hoping for the best but planning otherwise. Reassured Vernon [name] that Vernon [name] will let us know what he needs and that we will help support and provide guidance for her through this journey.  ? ?All questions/concerns addressed. Emotional support provided.  ? ?Exam: Lethargic, sleeping. No distress. Breathing regular, unlabored. Abd soft. [name] with feeding. Able to assist with movements in bed.  ? ?Plan: ?- DNR established.  ?- Ongoing palliative support recommended.  ? ?50 min ? ?Vernon Sill, NP ?Palliative Medicine Team ?Pager [phone] (Please see [email or web address] for schedule) ?Team Phone [phone]  ? ? ?Greater than 50%  of this time was spent counseling and coordinating care related to the above assessment and plan   ?

## 2022-02-28 NOTE — IPOC Note (Signed)
Overall Plan of Care (IPOC) ?Patient Details ?Name: Vernon [name] ?MRN: [id number] ?DOB: 1946/01/04 ? ?Admitting Diagnosis: 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 hemorrhage of brain ([place]) ? ?Hospital Problems: Principal Problem: ?  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 hemorrhage of brain ([place]) ? ? ? ? Functional Problem List: ?Nursing Bladder, Bowel, Edema, Endurance, Medication Management, Motor, Nutrition, Safety, Skin Integrity  ?PT Balance, Behavior, Edema, Endurance, Motor, Nutrition, Perception, Safety  ?OT Balance, Behavior, Cognition, Endurance, Motor, Perception, Safety, Vision  ?SLP Cognition, Safety, Nutrition  ?TR    ?    ? Basic ADL?s: ?OT Grooming, Bathing, Dressing, Toileting  ? ?  Advanced  ADL?s: ?OT    ?   ?Transfers: ?PT Bed Mobility, Bed to Chair, Car, Furniture  ?OT Toilet, Tub/Shower  ? ?  Locomotion: ?PT Ambulation, Wheelchair Mobility, Stairs  ? ?  Additional Impairments: ?OT Fuctional Use of Upper Extremity  ?SLP Swallowing, Communication, Social Cognition ?comprehension, expression ?Problem Solving, Memory, Attention, Awareness  ?TR    ? ? ?Anticipated Outcomes ?Item Anticipated Outcome  ?Self Feeding S  ?Swallowing ? Supervision A ?  ?Basic self-care ? S to min A  ?Toileting ? min A ?  ?Bathroom Transfers min A  ?Bowel/Bladder ? min assist  ?Transfers ? supervision  ?Locomotion ? CGA  ?Communication ? Supervision A  ?Cognition ? Supervision A  ?Pain ? n/a  ?Safety/Judgment ? min assist  ? ?Therapy Plan: ?PT Intensity: Minimum of 1-2 x/day ,45 to 90 minutes ?PT Frequency: 5 out of 7 days ?PT Duration Estimated Length of Stay: 21-24 days ?OT Intensity: Minimum of 1-2 x/day, 45 to 90 minutes ?OT Frequency: 5 out of 7 days ?OT Duration/Estimated Length of Stay: 3-3.5 weeks ?SLP Intensity: Minumum of 1-2 x/day, 30 to 90 minutes ?SLP Frequency: 3 to 5 out of 7 days ?SLP Duration/Estimated Length of Stay: 3-3.5  ? ?Due to the current state of emergency, patients may not be receiving their 3-hours of Medicare-mandated therapy. ? ? Team  Interventions: ?Nursing Interventions Patient/Family Education, Bladder Management, Bowel Management, Disease Management/Prevention, Medication Management, Skin Care/Wound Management, Cognitive Remediation/Compensation, Dysphagia/Aspiration Precaution Training, Discharge Planning, Psychosocial Support  ?PT interventions Ambulation/gait training, Community reintegration, DME/adaptive equipment instruction, Neuromuscular re-education, Psychosocial support, Stair training, UE/LE Strength taining/ROM, Wheelchair propulsion/positioning, UE/LE Coordination activities, Therapeutic Activities, Skin care/wound management, Pain management, Functional electrical stimulation, Discharge planning, Balance/vestibular training, Cognitive remediation/compensation, Disease management/prevention, Functional mobility training, Splinting/orthotics, Patient/family education, Therapeutic Exercise, Visual/perceptual remediation/compensation  ?OT Interventions Balance/vestibular training, Disease mangement/prevention, Neuromuscular re-education, Self Care/advanced ADL retraining, Therapeutic Exercise, Wheelchair propulsion/positioning, UE/LE Strength taining/ROM, Skin care/wound managment, Pain management, DME/adaptive equipment instruction, Cognitive remediation/compensation, Community reintegration, Functional electrical stimulation, Splinting/orthotics, UE/LE Coordination activities, Patient/family education, Discharge planning, Functional mobility training, Therapeutic Activities, Psychosocial support, Visual/perceptual remediation/compensation  ?SLP Interventions Cognitive remediation/compensation, Environmental controls, Internal/external aids, Speech/Language facilitation, Therapeutic Exercise, Therapeutic Activities, Patient/family education, Functional tasks, Dysphagia/aspiration precaution training, Cueing hierarchy  ?TR Interventions    ?SW/CM Interventions Discharge Planning, Psychosocial Support, Patient/Family Education,  Disease Management/Prevention  ? ?Barriers to Discharge ?MD  Medical stability and Behavior  ?Nursing Decreased caregiver support, Home environment access/layout, Incontinence, Wound Care, Lack of/limited family support, Weight, Nutrition means ?2 level 5 ste bil rails, main B+B, w spouse ( work FT) and able to provide min assist  ?PT Inaccessible home environment, Decreased caregiver support, Home environment access/layout, Incontinence, Lack of/limited family support, Insurance for SNF coverage, Nutrition means ?   ?OT Decreased caregiver support, Inaccessible home environment, Home environment access/layout, Incontinence, Insurance for SNF coverage ?   ?SLP Nutrition means ?   ?SW  Insurance for SNF coverage, Lack of/limited family support, Decreased caregiver support ?IV, [place]rach  ? ?Team Discharge Planning: ?Destination: PT-Home ,OT- Home , SLP-Home ?Projected Follow-up: PT-Home health PT, OT-  24 hour supervision/assistance, SLP-Home Health SLP, Outpatient SLP ?Projected Equipment Needs: PT-To be determined, OT- To be determined, SLP-To be determined ?Equipment Details: PT- , OT-  ?Patient/family involved in discharge planning: PT- Patient,  OT-Patient, SLP-Patient ? ?MD ELOS: 21d ?Medical Rehab Prognosis:  Good ?Assessment: The patient has been admitted for CIR therapies with the diagnosis of Left cerebellar ICH. The team will be addressing functional mobility, strength, stamina, balance, safety, adaptive techniques and equipment, self-care, bowel and bladder mgt, patient and caregiver education, dysphagia , NPO. Goals have been set at [place]. Anticipated discharge destination is Home . ? ?Due to the current state of emergency, patients may not be receiving their 3 hours per day of Medicare-mandated therapy.  ? ?  ?See Team Conference Notes for weekly updates to the plan of care ? ?

## 2022-02-28 NOTE — Progress Notes (Signed)
Patient ID: Vernon [name], male   DOB: 1946-02-03, 76 y.o.   MRN: [id number] ?Met with the patient to introduce self. Patient's wife expected later, patient unable to express needs other than wanting cortrak out. Nursing reports patient is incontinent; trial male purewick at [facility] and toileting q 2 hours during the day. Patient with continuous TF and q 4 hour free H20. Per nursing; wife has concerns about CODE status and plans for discharge; reviewing with SW. Sleep chart; patient sleeping well but reports fatigue. Telesitter continued for impulsiveness/inability to communicate needs to maintain safety. Continue to follow along to discharge to address educational needs to facilitate discharge home with spouse. [name] ? ?

## 2022-02-28 NOTE — Progress Notes (Signed)
This chaplain responded to the RN's consult for creating/updating the Pt. Advance Directive. The chaplain coordinated a time with the LPN-Courtney for the Pt. wife-[name] to be present at the bedside. The Pt. is sleeping at the time of the visit. ? ?The chaplain understands [name] planned time on Thursday morning to complete healthcare and durable POA, using the Pt. own notary and witnesses. [name] expressed her desire to update the Pt. Code status. The chaplain educated [name] on APP role in updating code status and updated Palliative Medicine Team NP-Alicia of [name]'s request. ? ?The chaplain accepted [name]'s apology for lack of patience with the process. [name] accepted the chaplain's invitation for F/U spiritual care on Thursday. ? ?Chaplain Sallyanne Kuster ?[phone] ?

## 2022-03-01 LAB — GLUCOSE, CAPILLARY
Glucose-Capillary: 168 mg/dL — ABNORMAL HIGH (ref 70–99)
Glucose-Capillary: 171 mg/dL — ABNORMAL HIGH (ref 70–99)
Glucose-Capillary: 172 mg/dL — ABNORMAL HIGH (ref 70–99)
Glucose-Capillary: 196 mg/dL — ABNORMAL HIGH (ref 70–99)
Glucose-Capillary: 207 mg/dL — ABNORMAL HIGH (ref 70–99)
Glucose-Capillary: 228 mg/dL — ABNORMAL HIGH (ref 70–99)

## 2022-03-01 MED ORDER — INSULIN GLARGINE-YFGN 100 UNIT/ML ~~LOC~~ SOLN
35.0000 [IU] | Freq: Every day | SUBCUTANEOUS | Status: DC
  Administered 2022-03-02: 35 [IU] via SUBCUTANEOUS
  Filled 2022-03-01: qty 0.35

## 2022-03-01 NOTE — Progress Notes (Signed)
Palliative: ? 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  He required intubation for airway protection and underwent emergent craniotomy on 3/18 with hematoma evacuation and drain placement.   Palliative care has been asked to get involved to further discuss goals of care in the setting of ICH and ongoing/worsening encephalopathy.  ? ?I met today at [facility] bedside and wife Vernon [name] present. Vernon [name] is more alert this morning although still dozing on and off during my visit. Vernon [name] was able to participate with PT this morning. Vernon [name] is happy that they were able to complete POA forms this morning as well. She feels much relieved now that DNR is in place and POA completed. Vernon [name] is tearful at times during conversation (mostly while discussing his family). I acknowledged the frustration that he must feel while his body is not physically cooperating with what he wishes to do and he agrees that he does feel frustrated. Vernon [name] replies "but I'll be okay" and they are both able to express their peace and comfort in their faith and letting God take control of the situation.  ? ?Therapeutic listening provided as they shared Vernon [name]'s joy playing bass in his church group. He has long loved playing guitar from a young age. He shares his talent with his beloved 31 yo granddaughter who is a Ecologist (he is looking forward to a visit from her this weekend). Vernon [name] also enjoys Barrister's clerk on television. He travelled the world in his experience in the [employer] and [employer] (we share a love of [place]). Family continue to enjoy the moments they are getting while Vernon [name] is awake and able to interact with them and speak of good memories. They plan to continue to enjoy each day they get and open to more comfort/hospice if he declines further.  ? ?All questions/concerns addressed.  Emotional support provided.  ? ?Exam: Fluctuating mental status during my visit. No distress. Sitting up in wheelchair. Breathing regular, unlabored. Abd soft. Able to shift and adjust his weight in chair.  ? ?Plan: ?- DNR.  ?- Ongoing palliative support.  ?- Open to hospice options (hospice facility in [place]) if he declines further (or lack of improvement).  ? ?50 min ? ?Vinie Sill, NP ?Palliative Medicine Team ?Pager [phone] (Please see [email or web address] for schedule) ?Team Phone [phone]  ? ? ?Greater than 50%  of this time was spent counseling and coordinating care related to the above assessment and plan   ?

## 2022-03-01 NOTE — Progress Notes (Signed)
Patient ID: Vernon [name], male   DOB: 04-21-1946, 76 y.o.   MRN: [id number] ? ?Patient VA HCBS sent to [facility], [place]. SW will submit application today and the process will take about 2-3 weeks.  ? ?Colletta [place] ?  ?P: [phone] Ext. [phone] ?

## 2022-03-01 NOTE — Progress Notes (Addendum)
Occupational Therapy Session Note ? ?Patient Details  ?Name: Vernon [name] ?MRN: [id number] ?Date of Birth: 09-01-1946 ? ?Today's Date: 03/01/2022 ?OT Individual Time: 7829-5621 ?OT Individual Time Calculation (min): 24 min  and Today's Date: 03/01/2022 ?OT Missed Time: 30 Minutes ?Missed Time Reason: Patient fatigue;Other (comment) (lethargic) ? ? ?Short Term Goals: ?Week 1:  OT Short Term Goal 1 (Week 1): Pt will bathe UB seated at sink with min A. ?OT Short Term Goal 2 (Week 1): Pt will complete > 2 grooming task at sink with S. ?OT Short Term Goal 3 (Week 1): Pt will complete sit to stand with min A in prep for standing ADL. ?OT Short Term Goal 4 (Week 1): Pt will complete toilet transfer with max A and LRAD. ? ?Skilled Therapeutic Interventions/Progress Updates:  ?  Session 1 Attempt: Pt received asleep in bed, did not wake to touch, voice, or environmental modifications (turning on lights.) Pt missed 60 min of scheduled OT due to fatigue/unable to rouse. Pt left with HOB at 30 degrees with telesitter on with bed alarm engaged, call bell in reach, and all immediate needs met.  ? ?Session 2 [phone]): Revisited pt to make up missed OT time. Pt received partially sat up in bed, continues to be lethargic, but responding to questions. Wife then present for session. Session focus on self-care retraining, activity tolerance, transfer retraining in prep for improved ADL/IADL/func mobility performance + decreased caregiver burden. Total A to sit up in bed to increase alertness. Consistent max A to maintain static sitting balance due to lethargy, pt with eyes closed but responds unintelligible at times to question. More alert when face washed with cold wash cloth. Total A to don own shirt/shorts with total A, but pt able to assist thread BUE. Stedy transfer with max A > recliner. ? ?Pt left seated in recliner with wife/ LPN present with safety belt alarm engaged, call bell in reach, and all immediate needs met.  ? ?Session  3 Attempt: Attempted to see pt to make up missed minutes, pt asleep in bed. Unable to rouse despite multimodal cuing/ environmental modification.s Pt left with hoB over 30 degrees, soft call bell in reach, and all immediate needs met. ? ?Therapy Documentation ?Precautions:  ?Precautions ?Precautions: Fall ?Precaution Comments: [name], L weakness, L coordination deficit ?Restrictions ?Weight Bearing Restrictions: No ? ?Pain: ? No s/sx throughout ?ADL: See Care Tool for more details. ? ? ?Therapy/Group: Individual Therapy ? ?Volanda Napoleon MS, OTR/L ? ?03/01/2022, 7:18 AM ?

## 2022-03-01 NOTE — Progress Notes (Signed)
Received call from telesitter stating that NG was bunched up and when nurse checked unable to see what she was talking about, tubing not kinked or bunched up and resting in chair.  ?

## 2022-03-01 NOTE — Progress Notes (Signed)
This chaplain attempted F/U spiritual care with the Pt. and Pt. wife-[name]. Palliative Care is present at the bedside. This chaplain will F/U at another time. ? ?Chaplain Sallyanne Kuster ?[phone] ? ? ?

## 2022-03-01 NOTE — Progress Notes (Addendum)
?                                                       PROGRESS NOTE ? ? ?Subjective/Complaints: ?Seen in PT, standing in parallel bars with mod A from PT ?No pain c/os ?Appreciate palliative assist ? ?ROS- limited by cognition  ? ?Objective: ?  ?No results found. ?Recent Labs  ?  02/27/22 ?1610  ?WBC 10.5  ?HGB 8.0*  ?HCT 24.7*  ?PLT 342  ? ? ?Recent Labs  ?  02/27/22 ?9604  ?NA 139  ?K 4.9  ?CL 106  ?CO2 27  ?GLUCOSE 175*  ?BUN 41*  ?CREATININE 1.69*  ?CALCIUM 9.0  ? ? ?No intake or output data in the 24 hours ending 03/01/22 0939 ?  ? ?  ? ?Physical Exam: ?Vital Signs ?Blood pressure 110/81, pulse 89, temperature 98.7 ?F (37.1 ?C), resp. rate 15, height '5\' 9"'$  (1.753 m), weight 89.7 kg, SpO2 94 %. ? ?ENT- Feeding tube left nares ?General: No acute distress ?Mood and affect are appropriate ?Heart: Regular rate and rhythm no rubs murmurs or extra sounds ?Lungs: Clear to auscultation, breathing unlabored, no rales or wheezes ?Abdomen: Positive bowel sounds, soft nontender to palpation, nondistended ?Extremities: No clubbing, cyanosis, or edema ?Skin: No evidence of breakdown, no evidence of rash ?Neurologic: Cranial nerves II through XII intact, motor strength is 5/5 in bilateral deltoid, bicep, tricep, grip, hip flexor, knee extensors, ankle dorsiflexor and plantar flexor ?Sensory exam normal sensation to light touch and proprioception in bilateral upper and lower extremities ?Cerebellar exam LUE ataxia ?Musculoskeletal: Full range of motion in all 4 extremities. No joint swelling ?Oriented to person only  ? ?Assessment/Plan: ?1. Functional deficits which require 3+ hours per day of interdisciplinary therapy in a comprehensive inpatient rehab setting. ?Physiatrist is providing close team supervision and 24 hour management of active medical problems listed below. ?Physiatrist and rehab team continue to assess barriers to discharge/monitor patient progress toward functional and medical goals ? ?Care  Tool: ? ?Bathing ? Bathing activity did not occur: Safety/medical concerns ?Body parts bathed by patient: Face  ? Body parts bathed by helper: Right arm, Chest, Left arm, Abdomen ?  ?  ?Bathing assist Assist Level: Total Assistance - Patient < 25% ?  ?  ?Upper Body Dressing/Undressing ?Upper body dressing Upper body dressing/undressing activity did not occur (including orthotics): Safety/medical concerns ?What is the patient wearing?: Pull over shirt ?   ?Upper body assist Assist Level: Total Assistance - Patient < 25% ?   ?Lower Body Dressing/Undressing ?Lower body dressing ? ? ? Lower body dressing activity did not occur: Safety/medical concerns ?What is the patient wearing?: Incontinence brief, Pants ? ?  ? ?Lower body assist Assist for lower body dressing: Maximal Assistance - Patient 25 - 49% ?   ? ?Toileting ?Toileting    ?Toileting assist Assist for toileting: Total Assistance - Patient < 25% ?  ?  ?Transfers ?Chair/bed transfer ? ?Transfers assist ? Chair/bed transfer activity did not occur: Safety/medical concerns ? ?Chair/bed transfer assist level: Maximal Assistance - Patient 25 - 49% (w/ stedy) ?  ?  ?Locomotion ?Ambulation ? ? ?Ambulation assist ? ? Ambulation activity did not occur: Safety/medical concerns ? ?  ?  ?   ? ?Walk 10 feet activity ? ? ?Assist ? Walk 10 feet activity did  not occur: Safety/medical concerns ? ?  ?   ? ?Walk 50 feet activity ? ? ?Assist Walk 50 feet with 2 turns activity did not occur: Safety/medical concerns ? ?  ?   ? ? ?Walk 150 feet activity ? ? ?Assist Walk 150 feet activity did not occur: Safety/medical concerns ? ?  ?  ?  ? ?Walk 10 feet on uneven surface  ?activity ? ? ?Assist Walk 10 feet on uneven surfaces activity did not occur: Safety/medical concerns ? ? ?  ?   ? ?Wheelchair ? ? ? ? ?Assist Is the patient using a wheelchair?: Yes ?Type of Wheelchair: Manual ?Wheelchair activity did not occur: Safety/medical concerns ? ?  ?   ? ? ?Wheelchair 50 feet with 2 turns  activity ? ? ? ?Assist ? ?  ?Wheelchair 50 feet with 2 turns activity did not occur: Safety/medical concerns ? ? ?   ? ?Wheelchair 150 feet activity  ? ? ? ?Assist ? Wheelchair 150 feet activity did not occur: Safety/medical concerns ? ? ?   ? ?Blood pressure 110/81, pulse 89, temperature 98.7 ?F (37.1 ?C), resp. rate 15, height '5\' 9"'$  (1.753 m), weight 89.7 kg, SpO2 94 %. ? ?Medical Problem List and Plan: ?1. 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            -patient may shower ?            -ELOS/Goals: 16-20 days, supervision to min assist goals with PT, OT and supervision with SLP ?2.  Antithrombotics: ?-DVT/anticoagulation:  Pharmaceutical: Heparin initiated 02/12/2022 ?            -antiplatelet therapy: N/A ?3. Pain Management: Tylenol as needed ?4. Mood/arousal:  ?-Amantadine 100 mg twice daily for arousal ?            -adjust timing  ?-check sleep chart ?-consider adjustment to shunt settings if no further improvement? ?-urine culture never done. Will recheck on admit ?            -antipsychotic agents: Zyprexa via 2.5 mg twice daily which seems to have helped with agitation ?5. Neuropsych: This patient is capable of making decisions on his own behalf. ?6. Skin/Wound Care: Routine skin checks ?7. Fluids/Electrolytes/Nutrition: Routine in and outs with follow-up chemistries ?8.  Postop large right-sided pleural effusion.  Status postthoracentesis with 1250 cc yield.  Latest chest x-ray unremarkable ?9.  Seizure prophylaxis.  Keppra 500 mg twice daily completed ?10.  Atrial fibrillation.  Eliquis currently on hold.  Neurology considering resuming in approximately 4 weeks.  Cardiac rate controlled ?11.  Diabetes mellitus.  Hemoglobin A1c 6.2.  NovoLog 2 units every 4 hours, Semglee 30 units daily ?CBG (last 3)   ?Recent Labs  ?  02/28/22 ?2019 02/28/22 ?2359 03/01/22 ?0359  ?GLUCAP 187* 168* 196*  ? ?Fair control increase Semglee to 35U ? ?12.  Chronic anemia.  Follow-up CBC ?13  Hypertension.  Coreg 3.125 mg twice daily.  Monitor with increased mobility ?          ?Vitals:  ? 02/28/22 1926 03/01/22 0409  ?BP: 130/60 110/81  ?Pulse: 77 89  ?Resp: 15 15  ?Temp: 99.4 ?F (37.4 ?C) 98.7 ?F (37.1 ?C)  ?SpO2: 95% 94%  ? Controlled 4/6 ?14.  Dysphagia.  Currently NPO.  Nasogastric tube feeds.  Dietary and speech therapy  follow-up ?            Repeat MBS per SLP ?  ? ?LOS: ?3 days ?A FACE TO FACE EVALUATION WAS PERFORMED ? ?Vernon [name] ?03/01/2022, 9:39 AM  ? ? ? ?

## 2022-03-01 NOTE — Progress Notes (Signed)
Physical Therapy Session Note ? ?Patient Details  ?Name: Vernon [name] ?MRN: [id number] ?Date of Birth: 1946-06-16 ? ?Today's Date: 03/01/2022 ?PT Individual Time: 6378-5885 and 0912-1005 ?PT Individual Time Calculation (min): 20 min and 53 min  ? ?Short Term Goals: ?Week 1:  PT Short Term Goal 1 (Week 1): Pt will perform bed mobility with consistent MinA. ?PT Short Term Goal 2 (Week 1): Pt will perform sit<>stand transfers with consistent ModA. ?PT Short Term Goal 3 (Week 1): Pt will perform stand pivot transfers with consistent ModA +1. ?PT Short Term Goal 4 (Week 1): Pt will initiate gait training. ? ? ?Skilled Therapeutic Interventions/Progress Updates:  ?Session 1.  ?Pt received sitting in WC and agreeable to PT. Pt performed step pivot transfer with mod assist and max cues for sequencing and attention to task, as pt attempting to pull pants to waist mid transfer requiring max assist to prevent posterior LOB initially.  ? ?Transported to rehab gym. Sit<>stand in parllel bars with mod assist x 15 with cues for UE support on rails beyon BLE to facilitate anterior weigh tsfhit.   ? ?Gait training in parallel bars with mod assist 64ft forward and reverse x 3. Noted heavy L lateral lean. Assist for weight shift R. ? ?Lateral reaching R and force R sided WB x 6 with mod assist from PT improve posture and attention to trask.  ? ?Stepping to 2inch step with UE supported on rails x 4 BLE with mod assist for weight shift R to advance the LLE.  ? ?WC propulsion with tband on Lhand rim and BUE with min assist from PT max cues for attention to the LUE.  ? ?Patient returned to room and left sitting in [facility] with call bell in reach and all needs met.   ? ? ?Session 2.  ? ?Pt received supine in bed and agreeable to PT. Supine>sit transfer with min assist and cues for use of LUE. Mod assist x1 for LOB to the L sitting EOB. All other sitting balance supervision assist.  ? ?Stedy transfer to toilet. Noted to have been incontinent of  bladder and void urine sitting on toilet. PT performed clothing management. Min assist for sit<>stand transfer in stedy x 4 throughout session.  ? ?Pt returned to recliner in stedy and requesting to rest due to fatigue. Noted to be falling asleep when not engaged in conversation. Left sitting in WC with call bell in reach and all needs met.  ? ?   ? ?Therapy Documentation ?Precautions:  ?Precautions ?Precautions: Fall ?Precaution Comments: [name], L weakness, L coordination deficit ?Restrictions ?Weight Bearing Restrictions: No ?General: ?PT Amount of Missed Time (min): 10 Minutes ?PT Missed Treatment Reason: Patient fatigue ?Vital Signs: ?Therapy Vitals ?Temp: 97.9 ?F (36.6 ?C) ?Pulse Rate: 61 ?Resp: 19 ?BP: (!) 106/49 ?Patient Position (if appropriate): Sitting ?Oxygen Therapy ?SpO2: 93 % ?O2 Device: Room Air ?Pain: ?Denies in AM and PM session ? ? ?Therapy/Group: Individual Therapy ? ?Lorie Phenix ?03/01/2022, 5:14 PM  ?

## 2022-03-02 LAB — URINALYSIS, ROUTINE W REFLEX MICROSCOPIC
Bilirubin Urine: NEGATIVE
Glucose, UA: NEGATIVE mg/dL
Ketones, ur: NEGATIVE mg/dL
Nitrite: NEGATIVE
Protein, ur: 30 mg/dL — AB
Specific Gravity, Urine: 1.013 (ref 1.005–1.030)
WBC, UA: >50 WBC/hpf — ABNORMAL HIGH (ref 0–5)
pH: 5 (ref 5.0–8.0)

## 2022-03-02 LAB — GLUCOSE, CAPILLARY
Glucose-Capillary: 119 mg/dL — ABNORMAL HIGH (ref 70–99)
Glucose-Capillary: 123 mg/dL — ABNORMAL HIGH (ref 70–99)
Glucose-Capillary: 142 mg/dL — ABNORMAL HIGH (ref 70–99)
Glucose-Capillary: 204 mg/dL — ABNORMAL HIGH (ref 70–99)
Glucose-Capillary: 211 mg/dL — ABNORMAL HIGH (ref 70–99)
Glucose-Capillary: 217 mg/dL — ABNORMAL HIGH (ref 70–99)

## 2022-03-02 LAB — URINE CULTURE: Culture: >=100000 — AB

## 2022-03-02 MED ORDER — INSULIN GLARGINE-YFGN 100 UNIT/ML ~~LOC~~ SOLN
40.0000 [IU] | Freq: Every day | SUBCUTANEOUS | Status: DC
  Administered 2022-03-03: 40 [IU] via SUBCUTANEOUS
  Filled 2022-03-02: qty 0.4

## 2022-03-02 NOTE — Progress Notes (Signed)
Physical Therapy Session Note ? ?Patient Details  ?Name: Vernon [name] ?MRN: [id number] ?Date of Birth: [birth year] ? ?Today's Date: 03/02/2022 ?PT Individual Time: 0175-1025 ?PT Individual Time Calculation (min): 59 min  ? ?Short Term Goals: ?Week 1:  PT Short Term Goal 1 (Week 1): Pt will perform bed mobility with consistent MinA. ?PT Short Term Goal 2 (Week 1): Pt will perform sit<>stand transfers with consistent ModA. ?PT Short Term Goal 3 (Week 1): Pt will perform stand pivot transfers with consistent ModA +1. ?PT Short Term Goal 4 (Week 1): Pt will initiate gait training. ? ?Skilled Therapeutic Interventions/Progress Updates:  ?Patient seated on toilet in bathroom with 2 NT's providing assist on entrance to room. Brought out via STEDY and PT takes over. Patient alert and agreeable to PT session.  ? ?Patient with no pain complaint throughout session. ? ?Tube feed running and request from nursing to disconnect for therapy session while nursing in room to provide insulin shot at start of session.  ? ?Therapeutic Activity: ?Transfers: Patient performed sit<>stand and stand pivot transfers throughout session with Max A initially and improving to [place] to attain balance. SPVT requires up to MaxA for maintaining balance through high steppage gait pattern with slow motor plan. Provided verbal cues for sequence and direction of stepping required to complete pivot. ? ?Neuromuscular Re-ed: ?NMR facilitated during session with focus on seated balance, coordination/ ataxia, standing balance. Pt guided in sit<>stand training with no AD and pt with significant posterior bias requiring up to int MaxA to maintain upright posture. Standing balance training with pt returning to sit quickly with c/o dizziness that dissipates quickly on sitting. Toe touches to colored discs with each LE when color called with pt improving in coordinated touch to correct color. Requires up to [place] to maintain balance with majority of task at [facility].  Progressed to toe touches to cones in semi circle array in front of pt.  ? ?During seated balance training without back support, pt participatesin activity of passing blue foam block back and forth from hand to hand with decreased coordinationfrom LUE and hand. Progressed to reaching for block at arm's distance away on table with under and overshooting along with zeroing in on target block. NMR performed for improvements in motor control and coordination, balance, sequencing, judgement, and self confidence/ efficacy in performing all aspects of mobility at highest level of independence.  ? ?Patient seated  in recliner at end of session with brakes locked, belt alarm set, and all needs within reach. Oriented to time and time of next therapy session.  ? ? ?Therapy Documentation ?Precautions:  ?Precautions ?Precautions: Fall ?Precaution Comments: [name], L weakness, L coordination deficit ?Restrictions ?Weight Bearing Restrictions: No ?General: ?  ?Vital Signs: ?Therapy Vitals ?Temp: 98.9 ?F (37.2 ?C) ?Temp Source: Oral ?Pain: ?Pain Assessment ?Pain Scale: 0-10 ?Pain Score: 0-No pain ? ?Therapy/Group: Individual Therapy ? ?Alger Simons PT, DPT ?03/02/2022, 9:04 AM  ?
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
## 2022-03-02 NOTE — Progress Notes (Signed)
Speech Language Pathology Daily Session Note ? ?Patient Details  ?Name: Vernon [name] ?MRN: [id number] ?Date of Birth: 20-Jan-1946 ? ?Today's Date: 03/02/2022 ?SLP Individual Time: 8366-2947 ?SLP Individual Time Calculation (min): 45 min ? ?Short Term Goals: ?Week 1: SLP Short Term Goal 1 (Week 1): Pt will participate in PO trials with SLP only (HTL and puree) with minimal s/s aspiration provided mod A multimodal cues for swallow strategies ?SLP Short Term Goal 2 (Week 1): Pt will increase orientation to biographical info, place, time and recent events provided mod A multimodal cues ?SLP Short Term Goal 3 (Week 1): Pt will increase speech intelligibility at phrase/simple sentence level to 80% with mod A multimodal cues ?SLP Short Term Goal 4 (Week 1): Pt will solve functional problems with 50% accuracy provided mod A cues ?SLP Short Term Goal 5 (Week 1): Pt will increase sustained attention to functional tasks to 5-7 minutes provided mod A cues ?SLP Short Term Goal 6 (Week 1): Pt will follow 2-step directions during functional tasks with 70% accuracy min cues ? ?Skilled Therapeutic Interventions: ?Pt seen for skilled ST with focus on cognitive and swallowing goals, pt upright in recliner and agreeable to therapeutic activities. Pt more alert/focused this date, no episodes of language of confusion. Pt oriented independently to month, year and hospital, cues to increase recall of DOW, date and reason for hospital stay. Pt states "I know I had a stroke I just couldn't say the word". Pt participating in simple conversation regarding prior history, likes/dislikes, travel, family, etc. With speech intelligibility ~70%. SLP facilitating ongoing trials of HTL via tsp and 1/2 tsp puree, pt with mild throat clear after swallow on ~50% of trials. Pt reports throat clear is baseline, however did increase in frequency in presence of PO. Pt observed with increased bolus control and transit and only requiring single swallow for most  trials (vs multiple swallows at eval). No change in vocal quality with any trials today, notable fatigue toward end of session. Pt left in recliner with alarm belt activated and all needs within reach. Cont ST POC.  ? ?Pain ?Pain Assessment ?Pain Scale: 0-10 ?Pain Score: 0-No pain ? ?Therapy/Group: Individual Therapy ? ?Vernon [name] ?03/02/2022, 8:27 AM ?

## 2022-03-02 NOTE — Progress Notes (Signed)
? ?  Palliative Medicine Inpatient Follow Up Note ? 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  He required intubation for airway protection and underwent emergent craniotomy on 3/18 with hematoma evacuation and drain placement.   ?  ?Palliative care has been asked to get involved to further discuss goals of care in the setting of ICH and ongoing/worsening encephalopathy. ? ?Patient transitioned up to CIR - Palliative has been following along supportively.  ? ?Today's Discussion (03/02/2022): ? ?*Please note that this is a verbal dictation therefore any spelling or grammatical errors are due to the "Welcome One" system interpretation. ? ?Chart reviewed inclusive of vital signs, progress notes, laboratory results, and diagnostic images.  ? ?I met with Vernon [name], his spouse, Vernon [name] and their daughter this evening. Vernon [name] was sitting up and his mental state was improved. Patients wife shares concerns in the setting of his waxing and waning delirium. We reviewed the course of delirium and how it can take quite sometime to clear. ? ?Created space and opportunity for patient to explore thoughts feelings and fears regarding current medical situation. Reviewed that rehabilitation is the opportunity for maximum improvements. Encouragement provided.  ? ?Questions and concerns addressed  ? ?Palliative Support Provided  ? ?Objective Assessment: ?Vital Signs ?Vitals:  ? 03/02/22 0600 03/02/22 1347  ?BP:  (!) 110/54  ?Pulse:  71  ?Resp:  16  ?Temp: 98.9 ?F (37.2 ?C) 99.3 ?F (37.4 ?C)  ?SpO2:  99%  ? ? ?Intake/Output Summary (Last 24 hours) at 03/02/2022 1646 ?Last data filed at 03/02/2022 0257 ?Gross per 24 hour  ?Intake --  ?Output 100 ml  ?Net -100 ml  ? ?Last Weight  Most recent update: 02/26/2022  5:53 PM  ? ? Weight  ?89.7 kg (197 lb 12 oz)  ?      ? ?  ? ?Gen: Elderly  male in no acute distress ?HEENT: moist mucous membranes, core track in place ?CV: Regular rate and irregular rhythm ?PULM: On RA ?ABD: soft/nontender ?EXT: No edema ?Neuro: Alert and oriented x2 ? ?SUMMARY OF RECOMMENDATIONS   ?DNAR/DNI ? ?Outpatient palliative care would be of benefit if patient progresses well enough to go back home ? ?Ongoing Palliative Care support ? ?MDM - Moderate ?______________________________________________________________________________________ ?Tacey Ruiz ?[place] Team ?Team Cell Phone: [phone] ?Please utilize secure chat with additional questions, if there is no response within 30 minutes please call the above phone number ? ?Palliative Medicine Team providers are available by phone from 7am to 7pm daily and can be reached through the team cell phone.  ?Should this patient require assistance outside of these hours, please call the patient's attending physician. ? ? ? ? ?

## 2022-03-02 NOTE — Progress Notes (Signed)
?                                                       PROGRESS NOTE ? ? ?Subjective/Complaints: ? ?Appreciate palliative assist, cognition much improved, still some disorientation but alert and cooperative with LUE incoordination  ? ?ROS- limited by cognition  ? ?Objective: ?  ?No results found. ?No results for input(s): WBC, HGB, HCT, PLT in the last 72 hours. ? ?No results for input(s): NA, K, CL, CO2, GLUCOSE, BUN, CREATININE, CALCIUM in the last 72 hours. ? ? ?Intake/Output Summary (Last 24 hours) at 03/02/2022 0942 ?Last data filed at 03/02/2022 0257 ?Gross per 24 hour  ?Intake --  ?Output 100 ml  ?Net -100 ml  ? ?  ? ?  ? ?Physical Exam: ?Vital Signs ?Blood pressure (!) 105/57, pulse 75, temperature 98.9 ?F (37.2 ?C), temperature source Oral, resp. rate 15, height '5\' 9"'$  (1.753 m), weight 89.7 kg, SpO2 94 %. ? ?ENT- Feeding tube left nares ?General: No acute distress ?Mood and affect are appropriate ?Heart: Regular rate and rhythm no rubs murmurs or extra sounds ?Lungs: Clear to auscultation, breathing unlabored, no rales or wheezes ?Abdomen: Positive bowel sounds, soft nontender to palpation, nondistended ?Extremities: No clubbing, cyanosis, or edema ?Skin: No evidence of breakdown, no evidence of rash ?Neurologic: Cranial nerves II through XII intact, motor strength is 5/5 in bilateral deltoid, bicep, tricep, grip, hip flexor, knee extensors, ankle dorsiflexor and plantar flexor ?Sensory exam normal sensation to light touch and proprioception in bilateral upper and lower extremities ?Cerebellar exam LUE ataxia ?Musculoskeletal: Full range of motion in all 4 extremities. No joint swelling ?Oriented to person only  ? ?Assessment/Plan: ?1. Functional deficits which require 3+ hours per day of interdisciplinary therapy in a comprehensive inpatient rehab setting. ?Physiatrist is providing close team supervision and 24 hour management of active medical problems listed below. ?Physiatrist and rehab team continue to  assess barriers to discharge/monitor patient progress toward functional and medical goals ? ?Care Tool: ? ?Bathing ? Bathing activity did not occur: Safety/medical concerns ?Body parts bathed by patient: Face  ? Body parts bathed by helper: Right arm, Chest, Left arm, Abdomen ?  ?  ?Bathing assist Assist Level: Total Assistance - Patient < 25% ?  ?  ?Upper Body Dressing/Undressing ?Upper body dressing Upper body dressing/undressing activity did not occur (including orthotics): Safety/medical concerns ?What is the patient wearing?: Pull over shirt ?   ?Upper body assist Assist Level: Total Assistance - Patient < 25% ?   ?Lower Body Dressing/Undressing ?Lower body dressing ? ? ? Lower body dressing activity did not occur: Safety/medical concerns ?What is the patient wearing?: Incontinence brief, Pants ? ?  ? ?Lower body assist Assist for lower body dressing: Maximal Assistance - Patient 25 - 49% ?   ? ?Toileting ?Toileting    ?Toileting assist Assist for toileting: Total Assistance - Patient < 25% ?  ?  ?Transfers ?Chair/bed transfer ? ?Transfers assist ? Chair/bed transfer activity did not occur: Safety/medical concerns ? ?Chair/bed transfer assist level: Maximal Assistance - Patient 25 - 49% (w/ stedy) ?  ?  ?Locomotion ?Ambulation ? ? ?Ambulation assist ? ? Ambulation activity did not occur: Safety/medical concerns ? ?  ?  ?   ? ?Walk 10 feet activity ? ? ?Assist ? Walk 10 feet activity did  not occur: Safety/medical concerns ? ?  ?   ? ?Walk 50 feet activity ? ? ?Assist Walk 50 feet with 2 turns activity did not occur: Safety/medical concerns ? ?  ?   ? ? ?Walk 150 feet activity ? ? ?Assist Walk 150 feet activity did not occur: Safety/medical concerns ? ?  ?  ?  ? ?Walk 10 feet on uneven surface  ?activity ? ? ?Assist Walk 10 feet on uneven surfaces activity did not occur: Safety/medical concerns ? ? ?  ?   ? ?Wheelchair ? ? ? ? ?Assist Is the patient using a wheelchair?: Yes ?Type of Wheelchair: Manual ?Wheelchair  activity did not occur: Safety/medical concerns ? ?  ?   ? ? ?Wheelchair 50 feet with 2 turns activity ? ? ? ?Assist ? ?  ?Wheelchair 50 feet with 2 turns activity did not occur: Safety/medical concerns ? ? ?   ? ?Wheelchair 150 feet activity  ? ? ? ?Assist ? Wheelchair 150 feet activity did not occur: Safety/medical concerns ? ? ?   ? ?Blood pressure (!) 105/57, pulse 75, temperature 98.9 ?F (37.2 ?C), temperature source Oral, resp. rate 15, height '5\' 9"'$  (1.753 m), weight 89.7 kg, SpO2 94 %. ? ?Medical Problem List and Plan: ?1. 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            -patient may shower ?            -ELOS/Goals: 16-20 days, supervision to min assist goals with PT, OT and supervision with SLP ?2.  Antithrombotics: ?-DVT/anticoagulation:  Pharmaceutical: Heparin initiated 02/12/2022 ?            -antiplatelet therapy: N/A ?3. Pain Management: Tylenol as needed ?4. Mood/arousal:  ?-Amantadine 100 mg twice daily for arousal ?            -adjust timing  ?-check sleep chart ?-consider adjustment to shunt settings if no further improvement? ?-urine culture never done. Will recheck on admit ?            -antipsychotic agents: Zyprexa via 2.5 mg twice daily which seems to have helped with agitation ?5. Neuropsych: This patient is capable of making decisions on his own behalf. ?6. Skin/Wound Care: Routine skin checks ?7. Fluids/Electrolytes/Nutrition: Routine in and outs with follow-up chemistries ?8.  Postop large right-sided pleural effusion.  Status postthoracentesis with 1250 cc yield.  Latest chest x-ray unremarkable ?9.  Seizure prophylaxis.  Keppra 500 mg twice daily completed ?10.  Atrial fibrillation.  Eliquis currently on hold.  Neurology considering resuming in approximately 4 weeks.  Cardiac rate  controlled ?11.  Diabetes mellitus.  Hemoglobin A1c 6.2.  NovoLog 2 units every 4 hours, Semglee 30 units daily ?CBG (last 3)  ?Recent Labs  ?  03/01/22 ?2351 03/02/22 ?0404 03/02/22 ?3016  ?GLUCAP 207* 119* 217*  ? ?Fair control increase Semglee to 40U ? ?12.  Chronic anemia.  Follow-up CBC ?13  Hypertension.  Coreg 3.125 mg twice daily.  Monitor with increased mobility ?          ?Vitals:  ? 03/02/22 0407 03/02/22 0600  ?BP: (!) 105/57   ?Pulse: 75   ?Resp: 15   ?Temp: (!) 100.5 ?F (38.1 ?C) 98.9 ?F (37.2 ?C)  ?SpO2: 94%   ? Controlled 4/7, low grade temp x 1 , no apparent source  check UA C and S ?14.  Dysphagia.  Currently NPO.  Nasogastric tube feeds.  Dietary and speech therapy follow-up ?            Repeat MBS per SLP ?  ? ?LOS: ?4 days ?A FACE TO FACE EVALUATION WAS PERFORMED ? ?Luanna Salk Dajuana Palen ?03/02/2022, 9:42 AM  ? ? ? ?

## 2022-03-03 LAB — GLUCOSE, CAPILLARY
Glucose-Capillary: 196 mg/dL — ABNORMAL HIGH (ref 70–99)
Glucose-Capillary: 213 mg/dL — ABNORMAL HIGH (ref 70–99)
Glucose-Capillary: 149 mg/dL — ABNORMAL HIGH (ref 70–99)
Glucose-Capillary: 162 mg/dL — ABNORMAL HIGH (ref 70–99)
Glucose-Capillary: 180 mg/dL — ABNORMAL HIGH (ref 70–99)

## 2022-03-03 MED ORDER — CEPHALEXIN 250 MG PO CAPS
500.0000 mg | ORAL_CAPSULE | Freq: Two times a day (BID) | ORAL | Status: AC
Start: 2022-03-03 — End: 2022-03-10
  Administered 2022-03-03 – 2022-03-10 (×14): 500 mg via ORAL
  Filled 2022-03-03 (×14): qty 2

## 2022-03-03 MED ORDER — NYSTATIN 100000 UNIT/ML MT SUSP
5.0000 mL | Freq: Four times a day (QID) | OROMUCOSAL | Status: DC
  Administered 2022-03-03 – 2022-03-10 (×26): 500000 [IU] via ORAL
  Filled 2022-03-03 (×30): qty 5

## 2022-03-03 MED ORDER — ORAL CARE MOUTH RINSE
15.0000 mL | Freq: Two times a day (BID) | OROMUCOSAL | Status: DC
  Administered 2022-03-03 – 2022-03-19 (×32): 15 mL via OROMUCOSAL

## 2022-03-03 MED ORDER — INSULIN GLARGINE-YFGN 100 UNIT/ML ~~LOC~~ SOLN
41.0000 [IU] | Freq: Every day | SUBCUTANEOUS | Status: DC
  Administered 2022-03-04: 41 [IU] via SUBCUTANEOUS
  Filled 2022-03-03: qty 0.41

## 2022-03-03 NOTE — Progress Notes (Signed)
Physical Therapy Session Note ? ?Patient Details  ?Name: Vernon [name] ?MRN: [id number] ?Date of Birth: 1946-08-04 ? ?Today's Date: 03/03/2022 ?PT Individual Time: 6834-1962 ?PT Individual Time Calculation (min): 72 min  ? ?Short Term Goals: ?Week 1:  PT Short Term Goal 1 (Week 1): Pt will perform bed mobility with consistent MinA. ?PT Short Term Goal 2 (Week 1): Pt will perform sit<>stand transfers with consistent ModA. ?PT Short Term Goal 3 (Week 1): Pt will perform stand pivot transfers with consistent ModA +1. ?PT Short Term Goal 4 (Week 1): Pt will initiate gait training. ? ?Skilled Therapeutic Interventions/Progress Updates:  ?Patient seated in ecliner on entrance to room. Patient alert and agreeable to PT session.  ? ?Patient with no pain complaint throughout session. ? ?Request to RN for tube feed disconnect. ? ?Therapeutic Activity: ?Transfers: Patient  initially performs ambulatory transfer from recliner to w/c with [place] for RW mgmt and sequencing/ positioning LLE. Throughout session, performed sit<>stand and stand pivot transfers with ModA for balance d/t posterior bias. Provided verbal cues for lean, LE positioning. ? ?Neuromuscular Re-ed: ?NMR facilitated during session with focus on standing balance, coordination. Pt guided in toe touches to colored discs on 6" step and second step. Guided initially to targets on first step, then progressing to first then second and back to first step. Focus on LLE for coordination, then RLE toe touches for LLE strengthening and increased muscle fiber facilitation. Pt also guided in sit<>stand training for improved balance attainment and technique. Pt does have tendency to move to sit early upon reaching stance. NMR performed for improvements in motor control and coordination, balance, sequencing, judgement, and self confidence/ efficacy in performing all aspects of mobility at highest level of independence.  ? ?Gait Training:  ?Patient ambulated 38 ft using RHR in hallway  with Min/ ModA for upright posture. VC/ tc for LE advancement and placement as well as assist for hip extension/ progression. Adjusted gait to use of RW with decreased quality of gait requiring Mod/ MaxA for LE advancement, walker mgmt, L hand placement remaining on RW, and upright posture. Provided vc/ tc for correction of all above impairments.  ? ?Patient seated upright  in recliner at end of session with brakes locked, belt alarm set, and all needs within reach. Rn notified as to pt's return in order to restart continuous feed.  ? ?Therapy Documentation ?Precautions:  ?Precautions ?Precautions: Fall ?Precaution Comments: [name], L weakness, L coordination deficit ?Restrictions ?Weight Bearing Restrictions: No ?General: ?  ?Vital Signs: ?  ?Pain: ? No pain complaint from pt this session.  ? ?Therapy/Group: Individual Therapy ? ?Alger Simons PT, DPT ?03/03/2022, 6:02 PM  ?

## 2022-03-03 NOTE — Progress Notes (Addendum)
? ?  Palliative Medicine Inpatient Follow Up Note ? 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  He required intubation for airway protection and underwent emergent craniotomy on 3/18 with hematoma evacuation and drain placement.   ?  ?Palliative care has been asked to get involved to further discuss goals of care in the setting of ICH and ongoing/worsening encephalopathy. ? ?Patient transitioned up to CIR - Palliative has been following along supportively.  ? ?Today's Discussion (03/03/2022): ? ?*Please note that this is a verbal dictation therefore any spelling or grammatical errors are due to the "[place] [facility]" system interpretation. ? ?Chart reviewed inclusive of vital signs, progress notes, laboratory results, and diagnostic images.  ? ?I met with Vernon [name] this evening. Nursing was present as he had just had a "choking spell" he felt "something was caught in his throat". An oral exam was complete appears to have notable oral thrush though nothing otherwise. He expressed that this feeling passed on it's own. Patient was breathing comfortably thereafter. He is a bit more disoriented today as he is aware of self though not place or time. Re-orientation performed. Denies pain, nausea, or shortness of breath. ? ?I was able to sit with Vernon [name] and provide him the opportunity to express any concerns he had. He denied any active concerns at the time. He expresses feeling tired. I shared that I would let him rest and see him again tomorrow which he agrees to.  ? ?Palliative Support Provided  ? ?Objective Assessment: ?Vital Signs ?Vitals:  ? 03/03/22 0406 03/03/22 1327  ?BP: 116/65 112/63  ?Pulse: 82 73  ?Resp: 15 18  ?Temp: 100.3 ?F (37.9 ?C) 98.7 ?F (37.1 ?C)  ?SpO2: 92% 95%  ? ? ?Intake/Output Summary (Last 24 hours) at 03/03/2022 1703 ?Last data filed at  03/03/2022 1634 ?Gross per 24 hour  ?Intake 1370 ml  ?Output 1550 ml  ?Net -180 ml  ? ? ?Last Weight  Most recent update: 02/26/2022  5:53 PM  ? ? Weight  ?89.7 kg (197 lb 12 oz)  ?      ? ?  ? ?Gen: Elderly male in no acute distress ?HEENT: white coating on tongue, moist mucous membranes, core track in place ?CV: Regular rate and irregular rhythm ?PULM: On RA ?ABD: soft/nontender ?EXT: No edema ?Neuro: Alert and oriented x2 ? ?SUMMARY OF RECOMMENDATIONS   ?DNAR/DNI ? ?Outpatient palliative care would be of benefit if patient progresses well enough to go back home ? ?Oral Thrush - Daily oral care, on oral nystatin 4x daily ? ?Ongoing Palliative Care support ? ?MDM - Moderate ?______________________________________________________________________________________ ?Tacey Ruiz ?[facility] Team ?Team Cell Phone: [phone] ?Please utilize secure chat with additional questions, if there is no response within 30 minutes please call the above phone number ? ?Palliative Medicine Team providers are available by phone from 7am to 7pm daily and can be reached through the team cell phone.  ?Should this patient require assistance outside of these hours, please call the patient's attending physician. ? ? ? ? ?

## 2022-03-03 NOTE — Progress Notes (Signed)
?                                                       PROGRESS NOTE ? ? ?Subjective/Complaints: ?NGT in place ?He has no complaints ?Waking up once in a while at night ?Moving bowels regularly.  ? ?ROS- Limited by cognition  ? ?Objective: ?  ?No results found. ?No results for input(s): WBC, HGB, HCT, PLT in the last 72 hours. ? ?No results for input(s): NA, K, CL, CO2, GLUCOSE, BUN, CREATININE, CALCIUM in the last 72 hours. ? ? ?Intake/Output Summary (Last 24 hours) at 03/03/2022 1323 ?Last data filed at 03/03/2022 [phone] ?Gross per 24 hour  ?Intake 1370 ml  ?Output 1150 ml  ?Net 220 ml  ?  ? ?  ? ?Physical Exam: ?Vital Signs ?Blood pressure 116/65, pulse 82, temperature 100.3 ?F (37.9 ?C), resp. rate 15, height '5\' 9"'$  (1.753 m), weight 89.7 kg, SpO2 92 %. ? ? ?General: No acute distress, BMI 29.20 ?Mood and affect are appropriate ?ENT- Feeding tube left nares ?Heart: Regular rate and rhythm no rubs murmurs or extra sounds ?Lungs: Clear to auscultation, breathing unlabored, no rales or wheezes ?Abdomen: Positive bowel sounds, soft nontender to palpation, nondistended ?Extremities: No clubbing, cyanosis, or edema ?Skin: No evidence of breakdown, no evidence of rash ?Neurologic: Cranial nerves II through XII intact, motor strength is 5/5 in bilateral deltoid, bicep, tricep, grip, hip flexor, knee extensors, ankle dorsiflexor and plantar flexor ?Sensory exam normal sensation to light touch and proprioception in bilateral upper and lower extremities ?Cerebellar exam LUE ataxia ?Musculoskeletal: Full range of motion in all 4 extremities. No joint swelling ?Oriented to person only  ? ?Assessment/Plan: ?1. Functional deficits which require 3+ hours per day of interdisciplinary therapy in a comprehensive inpatient rehab setting. ?Physiatrist is providing close team supervision and 24 hour management of active medical problems listed below. ?Physiatrist and rehab team continue to assess barriers to discharge/monitor patient  progress toward functional and medical goals ? ?Care Tool: ? ?Bathing ? Bathing activity did not occur: Safety/medical concerns ?Body parts bathed by patient: Face, Left arm, Right arm, Chest, Abdomen, Right upper leg, Left upper leg  ? Body parts bathed by helper: Right arm, Chest, Left arm, Abdomen ?  ?  ?Bathing assist Assist Level: Moderate Assistance - Patient 50 - 74% ?  ?  ?Upper Body Dressing/Undressing ?Upper body dressing Upper body dressing/undressing activity did not occur (including orthotics): Safety/medical concerns ?What is the patient wearing?: Pull over shirt ?   ?Upper body assist Assist Level: Maximal Assistance - Patient 25 - 49% ?   ?Lower Body Dressing/Undressing ?Lower body dressing ? ? ? Lower body dressing activity did not occur: Safety/medical concerns ?What is the patient wearing?: Pants ? ?  ? ?Lower body assist Assist for lower body dressing: Total Assistance - Patient < 25% ?   ? ?Toileting ?Toileting    ?Toileting assist Assist for toileting: Total Assistance - Patient < 25% ?  ?  ?Transfers ?Chair/bed transfer ? ?Transfers assist ? Chair/bed transfer activity did not occur: Safety/medical concerns ? ?Chair/bed transfer assist level: Moderate Assistance - Patient 50 - 74% ?  ?  ?Locomotion ?Ambulation ? ? ?Ambulation assist ? ? Ambulation activity did not occur: Safety/medical concerns ? ?  ?  ?   ? ?Walk 10 feet activity ? ? ?  Assist ? Walk 10 feet activity did not occur: Safety/medical concerns ? ?  ?   ? ?Walk 50 feet activity ? ? ?Assist Walk 50 feet with 2 turns activity did not occur: Safety/medical concerns ? ?  ?   ? ? ?Walk 150 feet activity ? ? ?Assist Walk 150 feet activity did not occur: Safety/medical concerns ? ?  ?  ?  ? ?Walk 10 feet on uneven surface  ?activity ? ? ?Assist Walk 10 feet on uneven surfaces activity did not occur: Safety/medical concerns ? ? ?  ?   ? ?Wheelchair ? ? ? ? ?Assist Is the patient using a wheelchair?: Yes ?Type of Wheelchair:  Manual ?Wheelchair activity did not occur: Safety/medical concerns ? ?  ?   ? ? ?Wheelchair 50 feet with 2 turns activity ? ? ? ?Assist ? ?  ?Wheelchair 50 feet with 2 turns activity did not occur: Safety/medical concerns ? ? ?   ? ?Wheelchair 150 feet activity  ? ? ? ?Assist ? Wheelchair 150 feet activity did not occur: Safety/medical concerns ? ? ?   ? ?Blood pressure 116/65, pulse 82, temperature 100.3 ?F (37.9 ?C), resp. rate 15, height '5\' 9"'$  (1.753 m), weight 89.7 kg, SpO2 92 %. ? ?Medical Problem List and Plan: ?1. 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            -patient may shower ?            -ELOS/Goals: 16-20 days, supervision to min assist goals with PT, OT and supervision with SLP ? Continue CIR ?2.  Antithrombotics: ?-DVT/anticoagulation:  Pharmaceutical: Heparin initiated 02/12/2022 ?            -antiplatelet therapy: N/A ?3. Pain Management: Tylenol as needed ?4. Decreased arousal:  ?-continue Amantadine 100 mg twice daily for arousal ?            -adjust timing  ?-check sleep chart ?-consider adjustment to shunt settings if no further improvement? ?-urine culture never done. Will recheck on admit ?            -antipsychotic agents: Zyprexa via 2.5 mg twice daily which seems to have helped with agitation ?5. Neuropsych: This patient is capable of making decisions on his own behalf. ?6. Skin/Wound Care: Routine skin checks ?7. Fluids/Electrolytes/Nutrition: Routine in and outs with follow-up chemistries ?8.  Postop large right-sided pleural effusion.  Status postthoracentesis with 1250 cc yield.  Latest chest x-ray unremarkable ?9.  Seizure prophylaxis.  Keppra 500 mg twice daily completed ?10.  Atrial fibrillation.  Eliquis currently on hold.  Neurology considering resuming in approximately 4 weeks.   Cardiac rate controlled ?11.  Diabetes mellitus.  Hemoglobin A1c 6.2.  NovoLog 2 units every 4 hours, Semglee 30 units daily ?CBG (last 3)  ?Recent Labs  ?  03/03/22 ?0401 03/03/22 ?0748 03/03/22 ?1155  ?GLUCAP 196* 213* 149*  ?Increase Semglee to 41U ? ?12.  Chronic anemia.  Follow-up CBC ?13  Hypertension.  Continue Coreg 3.125 mg twice daily.  Monitor with increased mobility ?          ?Vitals:  ? 03/02/22 1921 03/03/22 0406  ?BP: 114/61 116/65  ?Pulse: 72 82  ?Resp: 18 15  ?Temp: 99.6 ?F (37.6 ?C) 100.3 ?F (37.9 ?C)  ?SpO2: 99% 92%  ? ?14.  Dysphagia.  Currently NPO.  Nasogastric tube feeds.  Dietary and speech therapy follow-up ?            Repeat MBS per SLP ?15. UTI with <100,000 E Coli. Keflex started. F/u sensitivities ?  ? ?LOS: ?5 days ?A FACE TO FACE EVALUATION WAS PERFORMED ? ?Martha Clan P Baylon Santelli ?03/03/2022, 1:23 PM  ? ? ? ?

## 2022-03-03 NOTE — Progress Notes (Signed)
Speech Language Pathology Daily Session Note ? ?Patient Details  ?Name: TEVAN MARIAN ?MRN: [id number] ?Date of Birth: [birth year] ? ?Today's Date: 03/03/2022 ?SLP Individual Time: 7588-3254 ?SLP Individual Time Calculation (min): 45 min ? ?Short Term Goals: ?Week 1: SLP Short Term Goal 1 (Week 1): Pt will participate in PO trials with SLP only (HTL and puree) with minimal s/s aspiration provided mod A multimodal cues for swallow strategies ?SLP Short Term Goal 2 (Week 1): Pt will increase orientation to biographical info, place, time and recent events provided mod A multimodal cues ?SLP Short Term Goal 3 (Week 1): Pt will increase speech intelligibility at phrase/simple sentence level to 80% with mod A multimodal cues ?SLP Short Term Goal 4 (Week 1): Pt will solve functional problems with 50% accuracy provided mod A cues ?SLP Short Term Goal 5 (Week 1): Pt will increase sustained attention to functional tasks to 5-7 minutes provided mod A cues ?SLP Short Term Goal 6 (Week 1): Pt will follow 2-step directions during functional tasks with 70% accuracy min cues ? ?Skilled Therapeutic Interventions: ?Pt seen this date for skilled ST intervention targeting cognitive-linguistic goals outlined above. Pt encountered awake/alert and OOB in recliner chair. Intermittently restless with hands and legs. Agreeable to ST intervention at bedside. ? ?SLP facilitated cognitive-linguistic intervention by providing spaced retrieval interventions and errorless learning techniques to improve recall of functional information. Pt benefited from Max multimodal prompts for use of external memory aids (e.g. memory book) for recall of orientation questions including location, situation, emergent awareness, and date (calendar provided). Pt required Max A to utilize calendar for verbalizing today's date Sustained attention to task for ~5 minutes given Mod-Max multimodal A. Following delay and mass practice, cues were successfully faded from Max A to  Min-Mod A when answered orientation questions with memory book placed in front of pt. Additionally, pt began to demonstrate emergent awareness re: need for NG tube and deficits s/p CVA given Min-Mod verbal and visual A. ? ?SLP planned to address dysphagia goals; however, following Mod A for completion of oral care, pt had small emesis episode likely due to overstimulation of gag reflex with oral care. Oral care and suctioning provided in lieu of emesis episode and RN notified. Therapeutic trials of puree were deferred as result. Pt did not report any stomach pain before or following emesis. ? ?Session concluded with pt in chair, chair alarm on with tele-sitter present, call bell within reach, and all immediate needs met. Continue per current ST POC. ? ?Pain ?  ? ?Therapy/Group: Individual Therapy ? ?Hernan Turnage A Pearline Yerby ?03/03/2022, 1:56 PM ?

## 2022-03-03 NOTE — Progress Notes (Signed)
Occupational Therapy Session Note ? ?Patient Details  ?Name: Vernon [name] ?MRN: [id number] ?Date of Birth: 11-20-1946 ? ?Today's Date: 03/03/2022 ?OT Individual Time: 5732-2025 ?OT Individual Time Calculation (min): 58 min  ? ? ?Short Term Goals: ?Week 1:  OT Short Term Goal 1 (Week 1): Pt will bathe UB seated at sink with min A. ?OT Short Term Goal 2 (Week 1): Pt will complete > 2 grooming task at sink with S. ?OT Short Term Goal 3 (Week 1): Pt will complete sit to stand with min A in prep for standing ADL. ?OT Short Term Goal 4 (Week 1): Pt will complete toilet transfer with max A and LRAD. ? ?Skilled Therapeutic Interventions/Progress Updates:  ?   ?Pt received asleep in bed, required increased time to rouse but, agreeable to therapy with encouragement, no c/o pain. Session focus on self-care retraining, activity tolerance, func cog, transfer retraining in prep for improved ADL/IADL/func mobility performance + decreased caregiver burden. ? ?Max A to initiate sitting EOB/to progress BLE off bed and to lift trunk. Intermittent min A to CGA due to posterior/L lean / fatigue to maintain static sitting balance. Bathed UB with min A for thoroughness/initiation, bathed BLE with mod A for intiation and to bathe lower BLE.  Max A to thread BUE/head into shirt, total A to don pants. Light max A to come into standing with RW due to posterior bias/ L sided ataxia. Attempted stand-pivot to his L, pt with significant difficulty sequencing side step with his LLE. Ultimately complete stand-pivot to his R with heavy mod A. ? ?Pt disoriented to location as "[employer] Reoriented to [employer], required total A to recall OT am activities to write in book. ? ?Pt left seated in recliner with safety belt alarm engaged, call bell in reach, and all immediate needs met.  ? ?Therapy Documentation ?Precautions:  ?Precautions ?Precautions: Fall ?Precaution Comments: [name], L weakness, L coordination  deficit ?Restrictions ?Weight Bearing Restrictions: No ?Pain: no c/o throughout ?  ?ADL: See Care Tool for more details. ? ? ?Therapy/Group: Individual Therapy ? ?Volanda Napoleon MS, OTR/L ? ?03/03/2022, 6:50 AM ?

## 2022-03-04 LAB — GLUCOSE, CAPILLARY
Glucose-Capillary: 158 mg/dL — ABNORMAL HIGH (ref 70–99)
Glucose-Capillary: 164 mg/dL — ABNORMAL HIGH (ref 70–99)
Glucose-Capillary: 177 mg/dL — ABNORMAL HIGH (ref 70–99)
Glucose-Capillary: 198 mg/dL — ABNORMAL HIGH (ref 70–99)
Glucose-Capillary: 199 mg/dL — ABNORMAL HIGH (ref 70–99)
Glucose-Capillary: 208 mg/dL — ABNORMAL HIGH (ref 70–99)
Glucose-Capillary: 210 mg/dL — ABNORMAL HIGH (ref 70–99)

## 2022-03-04 LAB — URINE CULTURE: Culture: >=100000 — AB

## 2022-03-04 MED ORDER — INSULIN GLARGINE-YFGN 100 UNIT/ML ~~LOC~~ SOLN
42.0000 [IU] | Freq: Every day | SUBCUTANEOUS | Status: DC
  Administered 2022-03-05 – 2022-03-12 (×8): 42 [IU] via SUBCUTANEOUS
  Filled 2022-03-04 (×9): qty 0.42

## 2022-03-04 NOTE — Plan of Care (Signed)
?  Problem: RH BOWEL ELIMINATION ?Goal: RH STG MANAGE BOWEL WITH ASSISTANCE ?Description: STG Manage Bowel with [place]. ?Outcome: Not Progressing; incontinence ?  ?Problem: RH BLADDER ELIMINATION ?Goal: RH STG MANAGE BLADDER WITH ASSISTANCE ?Description: STG Manage Bladder With Min Assistance ?Outcome: Not Progressing; incontinence ?  ?Problem: RH SAFETY ?Goal: RH STG ADHERE TO SAFETY PRECAUTIONS W/ASSISTANCE/DEVICE ?Description: STG Adhere to Safety Precautions With Cues and Reminders. ?Outcome: Not Progressing; incontinence ?  ?

## 2022-03-04 NOTE — Progress Notes (Addendum)
? ?Palliative Medicine Inpatient Follow Up Note ? 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  He required intubation for airway protection and underwent emergent craniotomy on 3/18 with hematoma evacuation and drain placement.   ?  ?Palliative care has been asked to get involved to further discuss goals of care in the setting of ICH and ongoing/worsening encephalopathy. ? ?Patient transitioned up to CIR - Palliative has been following along supportively.  ? ?Today's Discussion (03/04/2022): ? ?*Please note that this is a verbal dictation therefore any spelling or grammatical errors are due to the "[place] One" system interpretation. ? ?Chart reviewed inclusive of vital signs, progress notes, laboratory results, and diagnostic images.  ? ?I met with Vernon [name] this afternoon. He was in the recliner in NAD. He shares that he feels thirsty, we reviewed that right now he is not safe to eat/drink. We discussed the plan for ongoing speech therapy in the days ahead. ? ?Vernon [name] otherwise shares that he feels "alright". He is oriented to person and place this morning.  ?_________________________________ ?Addendum: ? ?I met with Vernon [name] and his wife, Vernon [name] this evening.  ? ?Vernon [name] appears to be getting a bit more disoriented.  ? ?Vernon [name] shares with me Friday was a good day but better days have been few and far between in the setting of Vernon [name]'s complicated delirium. She shares that if he should continue to lack in progression she is worried about his future. He had been clear with her in the past about not wanting his life artificially prolonged. We discussed that it is important to further identify if Vernon [name] will progress with speech therapy. If he neglects to do so Vernon [name] reviews that he would not wish for a long term gastrostomy tube. I shared if that's the case then we  would be looking at hospice options. ? ?We discussed that presently Vernon [name] is on antibiotics for a UTI and receiving treatment for oral thrush. These may take a few days to truly effect Vernon [name] to show improvement. Reviewed that if he continues to do poorly from a speech perspective and is not progressing in rehab then consideration of hospice is reasonable based upon his wishes. ? ?Vernon [name] states the concern that with anticoagulation he is at risk for hemorrhage and without it he is at risk for stroke. She questions his risks and worries about his long and short term prognosis either way.  ? ?Plan is to allow time for outcomes. If by Wednesday not great improvements patients wife would like to shift focus and allow him to transfer to [facility] in [place].  ? ?Palliative Support Provided  ? ?Objective Assessment: ?Vital Signs ?Vitals:  ? 03/03/22 1327 03/04/22 0410  ?BP: 112/63 134/72  ?Pulse: 73 77  ?Resp: 18 18  ?Temp: 98.7 ?F (37.1 ?C) 98.4 ?F (36.9 ?C)  ?SpO2: 95% 97%  ? ? ?Intake/Output Summary (Last 24 hours) at 03/04/2022 1409 ?Last data filed at 03/04/2022 1006 ?Gross per 24 hour  ?Intake --  ?Output 800 ml  ?Net -800 ml  ? ? ?Last Weight  Most recent update: 02/26/2022  5:53 PM  ? ? Weight  ?89.7 kg (197 lb 12 oz)  ?      ? ?  ? ?Gen: Elderly male in no acute distress ?HEENT: white coating on tongue, moist mucous membranes, core track in place ?CV: Regular rate and  irregular rhythm ?PULM: On RA ?ABD: soft/nontender ?EXT: No edema ?Neuro: Alert and oriented x2 ? ?SUMMARY OF RECOMMENDATIONS   ?DNAR/DNI ? ?Allow time for outcomes ? ?Appreciate speech therapy speaking to patients wife regarding progress or lack-there-of ? ?E.Coli UTI - On Keflex per primary ? ?Oral Thrush - Daily oral care, on oral nystatin 4x daily ? ?Should [name] continue as presently patients wife doesn't want to prolong his life by artificial nutrition and would want him to transition to Hospice home in [place] ? ?Ongoing Palliative Care  support ? ?MDM - High ? ?Total Time: 65 ?______________________________________________________________________________________ ?Tacey Ruiz ?[place] Team ?Team Cell Phone: [phone] ?Please utilize secure chat with additional questions, if there is no response within 30 minutes please call the above phone number ? ?Palliative Medicine Team providers are available by phone from 7am to 7pm daily and can be reached through the team cell phone.  ?Should this patient require assistance outside of these hours, please call the patient's attending physician. ? ? ? ? ?

## 2022-03-04 NOTE — Progress Notes (Signed)
Slept good. Wakes up intermittently, but falls right back to sleep. Continent and incontinent of urine. Toileting every 4 hours at HS. Continent x 1 since 2300. PRN tylenol given at 0017, patient complained of HA. Patrici Ranks A  ?

## 2022-03-04 NOTE — Progress Notes (Signed)
Occupational Therapy Session Note ? ?Patient Details  ?Name: Vernon [name] ?MRN: [id number] ?Date of Birth: 1946/08/09 ? ?Today's Date: 03/04/2022 ?OT Individual Time: 1030-1059 ?OT Individual Time Calculation (min): 29 min  ? ? ?Short Term Goals: ?Week 1:  OT Short Term Goal 1 (Week 1): Pt will bathe UB seated at sink with min A. ?OT Short Term Goal 2 (Week 1): Pt will complete > 2 grooming task at sink with S. ?OT Short Term Goal 3 (Week 1): Pt will complete sit to stand with min A in prep for standing ADL. ?OT Short Term Goal 4 (Week 1): Pt will complete toilet transfer with max A and LRAD. ? ?Skilled Therapeutic Interventions/Progress Updates:  ?  Pt seated on BSC upon arrival with RN and NT present. Pt dependent for toileting tasks. Transfer to recliner with [place]. Pt orineted to year but required min verbal cues for month and day. Pt assisted pulling up Ted hose with BUE when seated in relciner. Pt donned Lt sock without assistance but required assistance donning Rt sock. Pt noted improved used of LUE. Pt assisted with memory notebook. Pt remained in recliner. Belt alarm activated. All needs within reach.  ? ?Therapy Documentation ?Precautions:  ?Precautions ?Precautions: Fall ?Precaution Comments: [name], L weakness, L coordination deficit ?Restrictions ?Weight Bearing Restrictions: No ?General: ?  ?Vital Signs: ?  ?Pain: ?Pt denies pain this monring ? ?Therapy/Group: Individual Therapy ? ?Leroy Libman ?03/04/2022, 11:01 AM ?

## 2022-03-04 NOTE — Progress Notes (Signed)
?                                                       PROGRESS NOTE ? ? ?Subjective/Complaints: ?Sleepy this morning ?Thrush noted, on Nystatin ?Slept well last night ?Mild headache improved with tylenol ? ?ROS- +mild headache ? ?Objective: ?  ?No results found. ?No results for input(s): WBC, HGB, HCT, PLT in the last 72 hours. ? ?No results for input(s): NA, K, CL, CO2, GLUCOSE, BUN, CREATININE, CALCIUM in the last 72 hours. ? ? ?Intake/Output Summary (Last 24 hours) at 03/04/2022 0846 ?Last data filed at 03/04/2022 0500 ?Gross per 24 hour  ?Intake --  ?Output 900 ml  ?Net -900 ml  ?  ? ?  ? ?Physical Exam: ?Vital Signs ?Blood pressure 134/72, pulse 77, temperature 98.4 ?F (36.9 ?C), temperature source Oral, resp. rate 18, height '5\' 9"'$  (1.753 m), weight 89.7 kg, SpO2 97 %. ? ? ?General: No acute distress, BMI 29.20, somnolent this morning ?Mood and affect are appropriate ?ENT- Feeding tube left nares ?Heart: Regular rate and rhythm no rubs murmurs or extra sounds ?Lungs: Clear to auscultation, breathing unlabored, no rales or wheezes ?Abdomen: Positive bowel sounds, soft nontender to palpation, nondistended ?Extremities: No clubbing, cyanosis, or edema ?Skin: No evidence of breakdown, no evidence of rash ?Neurologic: Cranial nerves II through XII intact, motor strength is 5/5 in bilateral deltoid, bicep, tricep, grip, hip flexor, knee extensors, ankle dorsiflexor and plantar flexor ?Sensory exam normal sensation to light touch and proprioception in bilateral upper and lower extremities ?Cerebellar exam LUE ataxia ?Musculoskeletal: Full range of motion in all 4 extremities. No joint swelling ?Oriented to person only  ? ?Assessment/Plan: ?1. Functional deficits which require 3+ hours per day of interdisciplinary therapy in a comprehensive inpatient rehab setting. ?Physiatrist is providing close team supervision and 24 hour management of active medical problems listed below. ?Physiatrist and rehab team continue to  assess barriers to discharge/monitor patient progress toward functional and medical goals ? ?Care Tool: ? ?Bathing ? Bathing activity did not occur: Safety/medical concerns ?Body parts bathed by patient: Face, Left arm, Right arm, Chest, Abdomen, Right upper leg, Left upper leg  ? Body parts bathed by helper: Right arm, Chest, Left arm, Abdomen ?  ?  ?Bathing assist Assist Level: Moderate Assistance - Patient 50 - 74% ?  ?  ?Upper Body Dressing/Undressing ?Upper body dressing Upper body dressing/undressing activity did not occur (including orthotics): Safety/medical concerns ?What is the patient wearing?: Pull over shirt ?   ?Upper body assist Assist Level: Maximal Assistance - Patient 25 - 49% ?   ?Lower Body Dressing/Undressing ?Lower body dressing ? ? ? Lower body dressing activity did not occur: Safety/medical concerns ?What is the patient wearing?: Pants ? ?  ? ?Lower body assist Assist for lower body dressing: Total Assistance - Patient < 25% ?   ? ?Toileting ?Toileting    ?Toileting assist Assist for toileting: Total Assistance - Patient < 25% ?  ?  ?Transfers ?Chair/bed transfer ? ?Transfers assist ? Chair/bed transfer activity did not occur: Safety/medical concerns ? ?Chair/bed transfer assist level: Moderate Assistance - Patient 50 - 74% ?  ?  ?Locomotion ?Ambulation ? ? ?Ambulation assist ? ? Ambulation activity did not occur: Safety/medical concerns ? ?  ?  ?   ? ?Walk 10 feet activity ? ? ?  Assist ? Walk 10 feet activity did not occur: Safety/medical concerns ? ?  ?   ? ?Walk 50 feet activity ? ? ?Assist Walk 50 feet with 2 turns activity did not occur: Safety/medical concerns ? ?  ?   ? ? ?Walk 150 feet activity ? ? ?Assist Walk 150 feet activity did not occur: Safety/medical concerns ? ?  ?  ?  ? ?Walk 10 feet on uneven surface  ?activity ? ? ?Assist Walk 10 feet on uneven surfaces activity did not occur: Safety/medical concerns ? ? ?  ?   ? ?Wheelchair ? ? ? ? ?Assist Is the patient using a  wheelchair?: Yes ?Type of Wheelchair: Manual ?Wheelchair activity did not occur: Safety/medical concerns ? ?  ?   ? ? ?Wheelchair 50 feet with 2 turns activity ? ? ? ?Assist ? ?  ?Wheelchair 50 feet with 2 turns activity did not occur: Safety/medical concerns ? ? ?   ? ?Wheelchair 150 feet activity  ? ? ? ?Assist ? Wheelchair 150 feet activity did not occur: Safety/medical concerns ? ? ?   ? ?Blood pressure 134/72, pulse 77, temperature 98.4 ?F (36.9 ?C), temperature source Oral, resp. rate 18, height '5\' 9"'$  (1.753 m), weight 89.7 kg, SpO2 97 %. ? ?Medical Problem List and Plan: ?1. 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            -patient may shower ?            -ELOS/Goals: 16-20 days, supervision to min assist goals with PT, OT and supervision with SLP ? Continue CIR ?2.  Antithrombotics: ?-DVT/anticoagulation:  Pharmaceutical: Heparin initiated 02/12/2022 ?            -antiplatelet therapy: N/A ?3. Pain Management: Tylenol as needed ?4. Decreased arousal:  ?-continue Amantadine 100 mg twice daily for arousal ?            -adjust timing  ?-check sleep chart ?-consider adjustment to shunt settings if no further improvement? ?-urine culture never done. Will recheck on admit ?            -antipsychotic agents: Zyprexa via 2.5 mg twice daily which seems to have helped with agitation ?5. Neuropsych: This patient is capable of making decisions on his own behalf. ?6. Skin/Wound Care: Routine skin checks ?7. Fluids/Electrolytes/Nutrition: Routine in and outs with follow-up chemistries ?8.  Postop large right-sided pleural effusion.  Status postthoracentesis with 1250 cc yield.  Latest chest x-ray unremarkable ?9.  Seizure prophylaxis.  Keppra 500 mg twice daily completed ?10.  Atrial fibrillation.  Eliquis currently on  hold.  Neurology considering resuming in approximately 4 weeks.  Cardiac rate controlled ?11.  Diabetes mellitus.  Hemoglobin A1c 6.2.  NovoLog 2 units every 4 hours, Semglee 30 units daily ?CBG (last 3)  ?Recent Labs  ?  03/04/22 ?0004 03/04/22 ?0408 03/04/22 ?0802  ?GLUCAP 164* 208* 210*  ?Increase Semglee to 42U ? ?12.  Chronic anemia.  Follow-up CBC ?13  Hypertension.  Continue Coreg 3.125 mg twice daily.  Monitor with increased mobility ?          ?Vitals:  ? 03/03/22 1327 03/04/22 0410  ?BP: 112/63 134/72  ?Pulse: 73 77  ?Resp: 18 18  ?Temp: 98.7 ?F (37.1 ?C) 98.4 ?F (36.9 ?C)  ?SpO2: 95% 97%  ? ?14.  Dysphagia.  Currently NPO.  Nasogastric tube feeds.  Dietary and speech therapy follow-up ?            Repeat MBS per SLP ?15. UTI with <100,000 E Coli. Keflex started. F/u sensitivities ?  ? ?LOS: ?6 days ?A FACE TO FACE EVALUATION WAS PERFORMED ? ?Martha Clan P Rayni Nemitz ?03/04/2022, 8:46 AM  ? ? ? ?

## 2022-03-05 LAB — GLUCOSE, CAPILLARY
Glucose-Capillary: 186 mg/dL — ABNORMAL HIGH (ref 70–99)
Glucose-Capillary: 174 mg/dL — ABNORMAL HIGH (ref 70–99)
Glucose-Capillary: 128 mg/dL — ABNORMAL HIGH (ref 70–99)
Glucose-Capillary: 120 mg/dL — ABNORMAL HIGH (ref 70–99)
Glucose-Capillary: 202 mg/dL — ABNORMAL HIGH (ref 70–99)

## 2022-03-05 NOTE — Progress Notes (Signed)
This chaplain attempted F/U spiritual care visit. The Pt. is sleeping and the Pt. wife is not present.  This chaplain will F/U at another time. ? ?Chaplain Sallyanne Kuster ?[phone] ?

## 2022-03-05 NOTE — Progress Notes (Signed)
?                                                       PROGRESS NOTE ? ? ?Subjective/Complaints: ? ?No issues overnite except pt tried to get OOB, pt does not recall ? ?Oriented to person and place but not time ? ?ROS- limited by cognition  ? ?ROS- +mild headache ? ?Objective: ?  ?No results found. ?No results for input(s): WBC, HGB, HCT, PLT in the last 72 hours. ? ?No results for input(s): NA, K, CL, CO2, GLUCOSE, BUN, CREATININE, CALCIUM in the last 72 hours. ? ? ?Intake/Output Summary (Last 24 hours) at 03/05/2022 1150 ?Last data filed at 03/05/2022 0409 ?Gross per 24 hour  ?Intake 0 ml  ?Output 400 ml  ?Net -400 ml  ? ?  ? ?  ? ?Physical Exam: ?Vital Signs ?Blood pressure (!) 119/59, pulse 72, temperature 98.9 ?F (37.2 ?C), temperature source Oral, resp. rate 18, height '5\' 9"'$  (1.753 m), weight 89.7 kg, SpO2 96 %. ? ? ?General: No acute distress ?Mood and affect are appropriate ?Heart: Regular rate and rhythm no rubs murmurs or extra sounds ?Lungs: Clear to auscultation, breathing unlabored, no rales or wheezes ?Abdomen: Positive bowel sounds, soft nontender to palpation, nondistended ?Extremities: No clubbing, cyanosis, or edema ?Skin: No evidence of breakdown, no evidence of rash ? ?Skin: No evidence of breakdown, no evidence of rash ?Neurologic: Cranial nerves II through XII intact, motor strength is 5/5 in bilateral deltoid, bicep, tricep, grip, hip flexor, knee extensors, ankle dorsiflexor and plantar flexor ?Sensory exam normal sensation to light touch and proprioception in bilateral upper and lower extremities ?Cerebellar exam LUE ataxia ?Musculoskeletal: Full range of motion in all 4 extremities. No joint swelling ?Oriented to person only  ? ?Assessment/Plan: ?1. Functional deficits which require 3+ hours per day of interdisciplinary therapy in a comprehensive inpatient rehab setting. ?Physiatrist is providing close team supervision and 24 hour management of active medical problems listed  below. ?Physiatrist and rehab team continue to assess barriers to discharge/monitor patient progress toward functional and medical goals ? ?Care Tool: ? ?Bathing ? Bathing activity did not occur: Safety/medical concerns ?Body parts bathed by patient: Right arm, Left arm, Chest, Abdomen, Front perineal area, Right upper leg, Face, Left upper leg  ? Body parts bathed by helper: Left lower leg, Right lower leg, Buttocks ?  ?  ?Bathing assist Assist Level: Minimal Assistance - Patient > 75% ?  ?  ?Upper Body Dressing/Undressing ?Upper body dressing Upper body dressing/undressing activity did not occur (including orthotics): Safety/medical concerns ?What is the patient wearing?: Pull over shirt ?   ?Upper body assist Assist Level: Minimal Assistance - Patient > 75% ?   ?Lower Body Dressing/Undressing ?Lower body dressing ? ? ? Lower body dressing activity did not occur: Safety/medical concerns ?What is the patient wearing?: Pants, Incontinence brief ? ?  ? ?Lower body assist Assist for lower body dressing: Maximal Assistance - Patient 25 - 49% ?   ? ?Toileting ?Toileting    ?Toileting assist Assist for toileting: Dependent - Patient 0% ?  ?  ?Transfers ?Chair/bed transfer ? ?Transfers assist ? Chair/bed transfer activity did not occur: Safety/medical concerns ? ?Chair/bed transfer assist level: Minimal Assistance - Patient > 75% ?  ?  ?Locomotion ?Ambulation ? ? ?Ambulation assist ? ? Ambulation activity  did not occur: Safety/medical concerns ? ?  ?  ?   ? ?Walk 10 feet activity ? ? ?Assist ? Walk 10 feet activity did not occur: Safety/medical concerns ? ?  ?   ? ?Walk 50 feet activity ? ? ?Assist Walk 50 feet with 2 turns activity did not occur: Safety/medical concerns ? ?  ?   ? ? ?Walk 150 feet activity ? ? ?Assist Walk 150 feet activity did not occur: Safety/medical concerns ? ?  ?  ?  ? ?Walk 10 feet on uneven surface  ?activity ? ? ?Assist Walk 10 feet on uneven surfaces activity did not occur: Safety/medical  concerns ? ? ?  ?   ? ?Wheelchair ? ? ? ? ?Assist Is the patient using a wheelchair?: Yes ?Type of Wheelchair: Manual ?Wheelchair activity did not occur: Safety/medical concerns ? ?  ?   ? ? ?Wheelchair 50 feet with 2 turns activity ? ? ? ?Assist ? ?  ?Wheelchair 50 feet with 2 turns activity did not occur: Safety/medical concerns ? ? ?   ? ?Wheelchair 150 feet activity  ? ? ? ?Assist ? Wheelchair 150 feet activity did not occur: Safety/medical concerns ? ? ?   ? ?Blood pressure (!) 119/59, pulse 72, temperature 98.9 ?F (37.2 ?C), temperature source Oral, resp. rate 18, height '5\' 9"'$  (1.753 m), weight 89.7 kg, SpO2 96 %. ? ?Medical Problem List and Plan: ?1. 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            -patient may shower ?            -ELOS/Goals: 03/20/22  supervision to min assist goals with PT, OT and supervision with SLP ? Continue CIR ?2.  Antithrombotics: ?-DVT/anticoagulation:  Pharmaceutical: Heparin initiated 02/12/2022 ?            -antiplatelet therapy: N/A ?3. Pain Management: Tylenol as needed ?4. Decreased arousal:  ?-continue Amantadine 100 mg twice daily for arousal ?            -adjust timing  ?-check sleep chart ?-consider adjustment to shunt settings if no further improvement? ?-urine culture never done. Will recheck on admit ?            -antipsychotic agents: Zyprexa via 2.5 mg twice daily which seems to have helped with agitation- may start to wean once pt stops trying to get OOB at noc  ?5. Neuropsych: This patient is capable of making decisions on his own behalf. ?6. Skin/Wound Care: Routine skin checks ?7. Fluids/Electrolytes/Nutrition: Routine in and outs with follow-up chemistries ?8.  Postop large right-sided pleural effusion.  Status postthoracentesis with 1250 cc yield.  Latest chest  x-ray unremarkable ?9.  Seizure prophylaxis.  Keppra 500 mg twice daily completed ?10.  Atrial fibrillation.  Eliquis currently on hold.  Neurology considering resuming in approximately 4 weeks.  Cardiac rate controlled ?11.  Diabetes mellitus.  Hemoglobin A1c 6.2.  NovoLog 2 units every 4 hours, Semglee 30 units daily ?CBG (last 3)  ?Recent Labs  ?  03/05/22 ?0352 03/05/22 ?0755 03/05/22 ?1142  ?GLUCAP 186* 174* 128*  ? ?Increase Semglee to 42U- controlled 03/05/22 ? ?12.  Chronic anemia.  Follow-up CBC ?13  Hypertension.  Continue Coreg 3.125 mg twice daily.  Monitor with increased mobility ?          ?Vitals:  ?  03/04/22 1952 03/05/22 0357  ?BP: (!) 109/58 (!) 119/59  ?Pulse: 71 72  ?Resp: 18 18  ?Temp: 98 ?F (36.7 ?C) 98.9 ?F (37.2 ?C)  ?SpO2: 100% 96%  ?Controlled 03/05/22 ?14.  Dysphagia.  Currently NPO.  Nasogastric tube feeds.  Dietary and speech therapy follow-up ?            Repeat MBS per SLP, if no improvement with MBS will need to discuss PEG placement with pt and wife  ?15. UTI with <100,000 E Coli. Keflex started. Sens to Keflex ?  ? ?LOS: ?7 days ?A FACE TO FACE EVALUATION WAS PERFORMED ? ?Luanna Salk Maxi Carreras ?03/05/2022, 11:50 AM  ? ? ? ?

## 2022-03-05 NOTE — Progress Notes (Signed)
Occupational Therapy Session Note ? ?Patient Details  ?Name: Vernon [name] ?MRN: [id number] ?Date of Birth: 02-04-1946 ? ?Today's Date: 03/05/2022 ?OT Individual Time: 6553-7482 ?OT Individual Time Calculation (min): 69 min  ? ? ?Short Term Goals: ?Week 1:  OT Short Term Goal 1 (Week 1): Pt will bathe UB seated at sink with min A. ?OT Short Term Goal 2 (Week 1): Pt will complete > 2 grooming task at sink with S. ?OT Short Term Goal 3 (Week 1): Pt will complete sit to stand with min A in prep for standing ADL. ?OT Short Term Goal 4 (Week 1): Pt will complete toilet transfer with max A and LRAD. ? ?Skilled Therapeutic Interventions/Progress Updates:  ?Patient received this am supine in bed with HOB to 30*.  Patient sleeping soundly - difficult to arouse - per nursing notes did not sleep well last night.   ? ADL:  Once patient awake and alert, agreeable to get up to edge of bed.  Needed cueing to move legs toward edge, then min assist to sit upright.  Patient able to maintain static sitting balance without assistance.   ?Patient bathed self with assist only for lower legs and thoroughness in standing to wash buttocks.   ?Patient able to stand x 30 seconds or more before becoming short of breath.  Recouped easily with seated rest break.   ?Patient able to don pull over shirt with set up assistance.  Patient able to thread left leg into pant leg, however - initially moving quickly and with minor LOB backward.  Patient able to stand and use one hand at a time to pull pants over hips.   ?Transferred to bedside recliner with mod assist - able to don shoes with increased time.  Able to tie left shoe when elevated on a surface.  Patient with tendency to hold breath with forward flexion.   ? ?Orientation:  Initially patient stating he was in [facility].  Provided orientation to hospital and city.  Patient later able to correctly recall place and situation.   ?Cognition:  Worked to orient patient to call bell - use of context  clues - nurse call - picture of nurse - locating TV button, channel/ volume, etc - then practice to reinforce.  ? ?Balance:  Worked on sit to stand with and without UE support.  Patient able to stand with UE's on back of sturdy chair with close supervision/contact guard, or with one hand on walker, one on arm of chair.  Worked on weight shifting and reducing reliance on UE's for standing as needed for ADL.   ? ?Patient left in recliner with feet up, and safety belt alarm in place/ engaged.  Call bell in his lap.   ? ?Therapy Documentation ?Precautions:  ?Precautions ?Precautions: Fall ?Precaution Comments: [name], L weakness, L coordination deficit ?Restrictions ?Weight Bearing Restrictions: No ?Pain: ?Pain Assessment ?Pain Scale: 0-10 ?Pain Score: 0-No pain ?Patient initially reported neck pain while lying in bed.  Unable to score - and no further report once up at edge of bed.   ? ? ? ?Therapy/Group: Individual Therapy ? ?Mariah Milling ?03/05/2022, 11:18 AM ?

## 2022-03-05 NOTE — Progress Notes (Signed)
Palliative: ? 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  He required intubation for airway protection and underwent emergent craniotomy on 3/18 with hematoma evacuation and drain placement.   Palliative care has been asked to get involved to further discuss goals of care in the setting of ICH and ongoing/worsening encephalopathy.   ? ?I came to visit today with Vernon [name] but he is active in therapy session and wife, Vernon [name], is not in room. I spoke with Vernon [name] via telephone. Vernon [name] is very interested in learning more about his swallow function, progress, and expectations. I did explain that his cognitive function seems to be the greatest barrier at this time for his swallowing and I did explain that there will be plans for repeat MBS to give [place] a better picture. This is very important to Vernon [name] as she considers his goals of care and path forward with anticipation of his quality of life. I have communicated with SLP regarding her concerns and desire for update from their team. At this time Vernon [name] is awaiting update from Wednesday rounds and we will continue to discuss best path forward.  ? ?All questions/concerns addressed. Emotional support provided.  ? ?Exam: Alert and working with therapy. No distress.  ? ?Plan: ?- DNR in place.  ?- Ongoing goals of care conversations.  ? ?25 min ? ?Vernon Sill, NP ?Palliative Medicine Team ?Pager [phone] (Please see [email or web address] for schedule) ?Team Phone [phone]  ? ? ?Greater than 50%  of this time was spent counseling and coordinating care related to the above assessment and plan   ?

## 2022-03-05 NOTE — Progress Notes (Signed)
Speech Language Pathology Daily Session Note ? ?Patient Details  ?Name: Vernon [name] ?MRN: [id number] ?Date of Birth: October 19, 1946 ? ?Today's Date: 03/05/2022 ?SLP Individual Time: 4782-9562 ?SLP Individual Time Calculation (min): 45 min ? ?Short Term Goals: ?Week 1: SLP Short Term Goal 1 (Week 1): Pt will participate in PO trials with SLP only (HTL and puree) with minimal s/s aspiration provided mod A multimodal cues for swallow strategies ?SLP Short Term Goal 2 (Week 1): Pt will increase orientation to biographical info, place, time and recent events provided mod A multimodal cues ?SLP Short Term Goal 3 (Week 1): Pt will increase speech intelligibility at phrase/simple sentence level to 80% with mod A multimodal cues ?SLP Short Term Goal 4 (Week 1): Pt will solve functional problems with 50% accuracy provided mod A cues ?SLP Short Term Goal 5 (Week 1): Pt will increase sustained attention to functional tasks to 5-7 minutes provided mod A cues ?SLP Short Term Goal 6 (Week 1): Pt will follow 2-step directions during functional tasks with 70% accuracy min cues ? ?Skilled Therapeutic Interventions:  Skilled treatment session focused on cognitive and dysphagia goals. Upon arrival, patient was awake in his recliner. Patient had been incontinent of urine which had soaked through his pants. SLP facilitated session by providing overall Min verbal cues for problem solving and safety while changing his brief and donning new pants via the [facility] with +2 assist for safety. Patient performed oral care via the suction toothbrush after set-up with supervision verbal cues for problem solving. Patient consumed trials of honey-thick liquids via tsp with delayed and consistent throat clearing with use of multiple swallows. Recommend patient remain NPO but suspect will be ready for repeat MBS early this week as patient's overall mentation appeared clearer today. Patient was independently oriented to city and situation but required Min  verbal cues for use of external aids for orientation to specific place. Patient was 100% intelligible at the sentence level with Mod I.  At end of session, patient able to identify the nurse call light but required Max verbal cues to generate possible sceneries in which he may need assistance from staff. Patient left upright in recliner with alarm on and all needs within reach. Continue with current plan of care.  ?   ? ?Pain ?No/Denies Pain  ? ?Therapy/Group: Individual Therapy ? ?Ayva Veilleux ?03/05/2022, 3:29 PM ?

## 2022-03-05 NOTE — Progress Notes (Signed)
Physical Therapy Session Note ? ?Patient Details  ?Name: Vernon [name] ?MRN: [id number] ?Date of Birth: 13-Jun-1946 ? ?Today's Date: 03/05/2022 ?PT Individual Time: 1007-1219 ?PT Individual Time Calculation (min): 59 min  ? ?Short Term Goals: ?Week 1:  PT Short Term Goal 1 (Week 1): Pt will perform bed mobility with consistent MinA. ?PT Short Term Goal 2 (Week 1): Pt will perform sit<>stand transfers with consistent ModA. ?PT Short Term Goal 3 (Week 1): Pt will perform stand pivot transfers with consistent ModA +1. ?PT Short Term Goal 4 (Week 1): Pt will initiate gait training. ? ?Skilled Therapeutic Interventions/Progress Updates:  ?Patient seated upright in recliner on entrance to room. Patient alert and agreeable to PT session. Requesting water to drink as mouth is dry. Unable to provide as pt's status remains NPO. Request to RN for disconnect of feeding line.  ? ?Patient with no pain complaint throughout session. Fatigue noted following ambulation bouts.  ? ?Therapeutic Activity: ?Transfers: Patient performed sit<>stand and stand pivot transfers during session with Min/ Mod A for controlled movement. Squat pivot transfers performed throughout session  generally with MinA and up to Mod A for placement. Provided verbal cues for technique including hand placement throughout. ? ?Neuromuscular Re-ed: ?NMR facilitated during session with focus on LLE movement and coordination with additional 4# aw. Pt is able to produce smooth movement with good movement pattern to each muscle group in LE . Pt guided in sit<>stands to no AD with 4#aw and demonstration of improved ability to attain upright balance. Forced use of LLE with fwd/ bkwd stepping and fwd lunge toward target. No LOB noted. Up to MinA provided to maintain balance with no AD for UE support.  NMR performed for improvements in motor control and coordination, balance, sequencing, judgement, and self confidence/ efficacy in performing all aspects of mobility at highest  level of independence.  ? ?Gait Training:  ?Patient ambulated 10' x2 using R hallway HR with MinA for first bout and CGA for second bout. 4#aw applied to pt's L ankle for significant improvement to ataxic movements. Significant increase in pt's ability to control movement and demonstrated improved advancement, positioning, sequencing. Provided vc/ tc for upright posture and level gaze. ? ?Patient seated  in recliner at end of session with brakes locked, belt alarm set, and all needs within reach. Pt continues to request cheeseburger. Continued education provided to pt re: upgrade in allowable food only happening with significant progress with ability to swallow effectively and correctly with no risk of food/ liquid to airway. Pt relates that ST related upgrade to thickened liquids and he has been eating food with ST. With no update to paperwork in room, this therapist cannot provide any liquid or food. But will connect with ST to find recommendation re: swallowing at this time.  ? ? ?Therapy Documentation ?Precautions:  ?Precautions ?Precautions: Fall ?Precaution Comments: [name], L weakness, L coordination deficit ?Restrictions ?Weight Bearing Restrictions: No ?General: ?  ?Vital Signs: ?Therapy Vitals ?Temp: 98.4 ?F (36.9 ?C) ?Temp Source: Oral ?Pulse Rate: 70 ?Resp: 15 ?BP: 110/68 ?Patient Position (if appropriate): Sitting ?Oxygen Therapy ?SpO2: 97 % ?O2 Device: Room Air ?Pain: ? No pain complaint this session. Fatigue related at end of session.  ? ?Therapy/Group: Individual Therapy ? ?Alger Simons PT, DPT ?03/05/2022, 5:52 PM  ?

## 2022-03-05 NOTE — Progress Notes (Signed)
Patient awake all night. Several attempts OOB without assistance. Bedalarm and telemonitor in place to alert staff and remind patient not to get up without assistance. PRN tylenol given at 2137 for generalized discomfort. Osmolite infusing  without problems. Patrici Ranks A  ?

## 2022-03-06 LAB — GLUCOSE, CAPILLARY
Glucose-Capillary: 121 mg/dL — ABNORMAL HIGH (ref 70–99)
Glucose-Capillary: 150 mg/dL — ABNORMAL HIGH (ref 70–99)
Glucose-Capillary: 153 mg/dL — ABNORMAL HIGH (ref 70–99)
Glucose-Capillary: 161 mg/dL — ABNORMAL HIGH (ref 70–99)
Glucose-Capillary: 161 mg/dL — ABNORMAL HIGH (ref 70–99)
Glucose-Capillary: 173 mg/dL — ABNORMAL HIGH (ref 70–99)
Glucose-Capillary: 177 mg/dL — ABNORMAL HIGH (ref 70–99)

## 2022-03-06 NOTE — Progress Notes (Signed)
Physical Therapy Weekly Progress Note ? ?Patient Details  ?Name: Vernon [name] ?MRN: [id number] ?Date of Birth: [birth year] ? ?Beginning of progress report period: February 27, 2022 ?End of progress report period: March 06, 2022 ? ?Today's Date: 03/06/2022 ?PT Individual Time: 7915-0569 ?PT Individual Time Calculation (min): 73 min  ? ?Patient has met 3 of 4 short term goals.  Pt making slow progress towards goals d/t complications with cognition with UTI. He does well to participate in scheduled therapy session but does show fatigue with effort. He continues to require physical Min/ Mod assist to complete bed mobility d/t coordination, functional transfers with Min/ ModA dependent on fatigue/ coordination, and completes short distance ambulation using hallway HR or RW and MinA with weighted LLE or ModA without. Has not yet progressed to stair training. He continues to request food/ water despite NPO d/t extreme dry mouth. Is limited by activity tolerance as well as generalized and L sided weakness and ataxia/ coordination   ? ?Patient continues to demonstrate the following deficits muscle weakness, decreased cardiorespiratoy endurance, impaired timing and sequencing, unbalanced muscle activation, ataxia, decreased coordination, and decreased motor planning, decreased midline orientation and decreased attention to left, decreased attention, decreased problem solving, decreased safety awareness, and decreased memory, and decreased standing balance, decreased postural control, and decreased balance strategies and therefore will continue to benefit from skilled PT intervention to increase functional independence with mobility. ? ?Patient progressing toward long term goals..  Continue plan of care. ? ?PT Short Term Goals ?Week 1:  PT Short Term Goal 1 (Week 1): Pt will perform bed mobility with consistent MinA. ?PT Short Term Goal 1 - Progress (Week 1): Met ?PT Short Term Goal 2 (Week 1): Pt will perform sit<>stand transfers  with consistent ModA. ?PT Short Term Goal 2 - Progress (Week 1): Met ?PT Short Term Goal 3 (Week 1): Pt will perform stand pivot transfers with consistent ModA +1. ?PT Short Term Goal 3 - Progress (Week 1): Progressing toward goal ?PT Short Term Goal 4 (Week 1): Pt will initiate gait training. ?PT Short Term Goal 4 - Progress (Week 1): Met ?Week 2:  PT Short Term Goal 1 (Week 2): Pt will perform bed mobility with consistent CGA. ?PT Short Term Goal 2 (Week 2): Pt will perform sit<>stand transfers with consistent MinA. ?PT Short Term Goal 3 (Week 2): Pt will perform stand pivot transfers with consistent ModA +1. ?PT Short Term Goal 4 (Week 2): Pt will ambulate 71ft using LRAD with consistent MinA. ? ?Skilled Therapeutic Interventions/Progress Updates:  ?Patient seated upright in recliner on entrance to room. Patient alert and agreeable to PT session. Nurse present to provide injection. Request to nurse for disconnect of tube feeding for therapy.  ? ?Patient with no pain complaint throughout session. ? ?Pt requests something to wet mouth again prior to therapy. Disc with SLP who is agreeable to a few ice chips. Inially provided pt with mouth sponge that has been wet but with extra moisture squeezed out. No issues. Then progressed to provision of ice chips. Pt provided with 2-3 ice chips pre- and post session with no issues noted. Pt very appreciative. ? ?Therapeutic Activity: ?Transfers: Patient performed sit<>stand and stand pivot transfers throughout session with MinA and intermittent ModA for balance. Provided verbal cues fornot rushing and taking time for complete turn using RW prior to descent to sit. Pt demos fear from fatigue and tends to slide/ throw self into chair on approach. Improvement throughout session with pt improving impulsive sit  with consistent vc/ tc and turning to position. Will continue to require training for reaching UE back to armrest prior to sit.  ? ?Gait Training:  ?Patient ambulated 10 ft  from recliner to doorway of room using RW with Min/ ModA. 4# aw donned. Demonstrated improved LLE advancement and positioning of LE with aw donned. Fatigued at doorway and impulsively throws self into w/c that is not locked. Heavy ModA  to maintain safety.  ? ?Pt amb 36' x3 with extensive rest breaks between. Improving with quality of turn to seat as well as with recovery over fatigue with each bout. Provided vc/ tc throughout for upright posture, level gaze, midline orientation with RW, LLE advancement. Improved L hand grip noted this session.  ? ?Neuromuscular Re-ed: ?NMR facilitated during session with focus on standing balance. Pt guided in sit<>stand performance to no AD. Requires UE support and consistently reaching out despite vc/ tc for slowing performance.  NMR performed for improvements in motor control and coordination, balance, sequencing, judgement, and self confidence/ efficacy in performing all aspects of mobility at highest level of independence.  ? ?Patient seated  in recliner at end of session with brakes locked, belt alarm set, and all needs within reach. 3 more small ice chips provided with no issues.  ? ? ?Therapy Documentation ?Precautions:  ?Precautions ?Precautions: Fall ?Precaution Comments: [name], L weakness, L coordination deficit ?Restrictions ?Weight Bearing Restrictions: No ?General: ?  ?Vital Signs: ?Therapy Vitals ?Temp: 97.9 ?F (36.6 ?C) ?Pulse Rate: 66 ?Resp: 16 ?BP: (!) 112/57 ?Patient Position (if appropriate): Sitting ?Oxygen Therapy ?SpO2: 96 % ?O2 Device: Room Air ?Pain: ? No pain complaint this session.  ? ?Therapy/Group: Individual Therapy ? ?Alger Simons PT, DPT ?03/06/2022, 5:34 PM  ?

## 2022-03-06 NOTE — Progress Notes (Signed)
Nutrition Follow-up ? ?DOCUMENTATION CODES:  ? ?Not applicable ? ?INTERVENTION:  ?Continue Osmolite 1.5 cal formula via [name] NGT at goal rate of 70 ml/hr x 20 hours (may hold TF for up to 4 hours for therapy) ?  ?Provide 45 ml Prosource TF BID per tube. ?  ?Free water flushes of 200 ml q 4 hours per tube.  ?  ?Tube feeding regimen to provide 2180 kcal, 110 grams of protein, and 2264 ml free water.  ? ?NUTRITION DIAGNOSIS:  ? ?Inadequate oral intake related to inability to eat as evidenced by NPO status; ongoing ? ?GOAL:  ? ?Patient will meet greater than or equal to 90% of their needs; met with TF ? ?MONITOR:  ? ?TF tolerance, Skin, I & O's, Weight trends, Labs ? ?REASON FOR ASSESSMENT:  ? ?Consult ?Enteral/tube feeding initiation and management ? ?ASSESSMENT:  ? ?76 year old right-handed male with history of chronic anemia, diabetes mellitus, hyperlipidemia, hypertension. Presented 02/10/2022 with left-sided weakness and incoordination as well as nausea vomiting. 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 Pt with functional deficits secondary to intraparenchymal hemorrhage admitted to CIR. ? ?Pt continues on NPO status. Pt with tube feeds infusing via [name] NGT and has been tolerating it. Plans for MBS this week. Per MD, if no improvement in dysphagia will plan to discuss PEG with pt and family. Plans to continue current tube feeding orders. Labs and medications reviewed.  ? ?Diet Order:   ?Diet Order   ? ?       ?  Diet NPO time specified  Diet effective now       ?  ? ?  ?  ? ?  ? ? ?EDUCATION NEEDS:  ? ?Not appropriate for education at this time ? ?Skin:  Skin Assessment: Reviewed RN Assessment ?Skin Integrity Issues:: Incisions ?Incisions: head ? ?Last BM:  4/10 ? ?Height:  ? ?Ht Readings from Last 1 Encounters:  ?02/26/22 $RemoveBe'5\' 9"'AHfubsrjV$  (1.753 m)   ? ? ?Weight:  ? ?Wt Readings from Last 1 Encounters:  ?02/26/22 89.7 kg  ? ?BMI:  Body mass index is 29.2 kg/m?. ? ?Estimated Nutritional Needs:  ? ?Kcal:  2100-2300 ? ?Protein:  105-120 grams ? ?Fluid:  >/= 2 L/day ? ?Corrin Parker, MS, RD, LDN ?RD pager number/after hours weekend pager number on Am[employer]. ? ?

## 2022-03-06 NOTE — Progress Notes (Signed)
Occupational Therapy Weekly Progress Note ? ?Patient Details  ?Name: Vernon [name] ?MRN: [id number] ?Date of Birth: 10/15/1946 ? ?Beginning of progress report period: February 27, 2022 ?End of progress report period: March 06, 2022 ? ?Today's Date: 03/06/2022 ?OT Individual Time: 6333-5456 ?OT Individual Time Calculation (min): 10 min + 32 + 51 min ? ? ?Patient has met 1 of 4 short term goals.  Pt has made slow progress this week towards LTG. Pt has demonstrated improved static standing/dynamic standing balance, activity tolerance, functional use of LUE, alertness to presently complete UB dressing/bathing at sink with min a, oral care with suction toothbrush with min A, LBD at max to total A, grooming with S to min A. Pt continues to require total A for toileting tasks and is most often completing toilet transfers in stedy with max A. Pt cont to be primarily limited by episodes of confusion, decreased activity tolerance, disorientation, LUE ataxia, posterior bias in sitting/standing, and cognitive deficits. Anticipate 24/7 S and min to mod physical assist required upon DC. Wife has been for some OT sessions, hands on transfer training not yet initiated.  ? ? ?Patient continues to demonstrate the following deficits: muscle weakness, decreased cardiorespiratoy endurance, impaired timing and sequencing, unbalanced muscle activation, motor apraxia, ataxia, decreased coordination, and decreased motor planning, decreased midline orientation, decreased attention to left, and decreased motor planning, decreased attention, decreased awareness, decreased problem solving, decreased safety awareness, decreased memory, and delayed processing, and decreased sitting balance, decreased standing balance, decreased postural control, decreased balance strategies, and L HP  and therefore will continue to benefit from skilled OT intervention to enhance overall performance with BADL and Reduce care partner burden. ? ?Patient progressing toward  long term goals..  Continue plan of care. ? ?OT Short Term Goals ?Week 1:  OT Short Term Goal 1 (Week 1): Pt will bathe UB seated at sink with min A. ?OT Short Term Goal 1 - Progress (Week 1): Met ?OT Short Term Goal 2 (Week 1): Pt will complete > 2 grooming task at sink with S. ?OT Short Term Goal 2 - Progress (Week 1): Not met ?OT Short Term Goal 3 (Week 1): Pt will complete sit to stand with min A in prep for standing ADL. ?OT Short Term Goal 3 - Progress (Week 1): Not met ?OT Short Term Goal 4 (Week 1): Pt will complete toilet transfer with max A and LRAD. ?OT Short Term Goal 4 - Progress (Week 1): Not met ?Week 2:  OT Short Term Goal 1 (Week 2): Pt will complete toilet transfer with max A and LRAD. ?OT Short Term Goal 2 (Week 2): Pt will complete sit to stand with min A in prep for standing ADL. ?OT Short Term Goal 3 (Week 2): Pt will complete > 2 grooming task at sink with S. ?OT Short Term Goal 4 (Week 2): Pt will utilize external aids to with no more than mod cuing to orient self to location. ? ?Skilled Therapeutic Interventions/Progress Updates:  ?  Session 1 [phone]): Pt received asleep in bed. Nodding head but not opening eyes when asked if ready to get OOB. Total A to don B teds/socks at bed level, pt able to raise RLE to assist when instructed. Pt keeping eyes closed, despite multimodal cuing and environmental modifications (raising HOB, turning on lights, etc.) Session terminate due to unable to rouse pt sufficiently to participate in therapy. Pt missed 50 min of scheduled OT due to fatigue/inability to rouse. Pt left with HOB over  30 degrees, telesitter on, 4 bed rails up per pt safety with bed alarm engaged, call bell in reach, and all immediate needs met.  ? ? ?Session 2 [phone]): Returned 20 min later to make up missed time and pt more alert/awake. Pt received semi-reclined in bed, agreeable to therapy. Session focus on self-care retraining, activity tolerance, transfer retraining in prep for  improved ADL/IADL/func mobility performance + decreased caregiver burden. Came to sitting EOB with mod A to place LUE on bed rail and to progress BLE off bed and to lift trunk. Reports need to go to bathroom. S in stedy > toilet, total A for clothing management and donning shorts. Continent void of bladder and pt able to cleanse anterior periarea post void standing in stedy. Doffed/donned shirt with only S this date while seated supported in recliner.  ? ?Pt oriented to [facility], stroke and its effects "my speech and coordination" this date without additional cuing/use of external aids. Continues to ask to drink and requires cues to recall purpose of NG tube. Did recall "getting washed up and brushing teeth" when asked what we did in OT session to write in memory notebook. ? ? ?Pt left seated in recliner with telesitter on, safety belt alarm engaged, call bell in reach, and all immediate needs met.  ? ?Session 3 [phone]): Pt received en route to toilet in stedy with SLP/LPN, agreeable to therapy. Session focus on self-care retraining, activity tolerance, LUE NMR, transfer retraining in prep for improved ADL/IADL/func mobility performance + decreased caregiver burden.No c/o pain throughout session but endorses fatigue. Continent void of bowel. Total A for posterior pericare and LB clothing management, S for sit to stand in stedy. Total A w/c transport to and from gym. Pt able to recall PO trials he performed with SLP. ? ?Short ambulatory transfer to and from mat table with mod A for RW management and upright posture + cues to slow down due to impulsivity.  ? ?Pt played 3 rounds of horse shoes with LUE, and light min A for balance throughout + to block L foot. Good control of horse shoe, only dropping 2x.  ? ?Massed practice of sit to stands and side stepping L to R. Initially with significant difficulty sequencing LLE, but drastic improvement with cues for upright posture and to slow down.  ? ? ?Pt left  seated in recliner with safety belt alarm engaged, call bell in reach, and all immediate needs met.  ? ? ? ?Therapy Documentation ?Precautions:  ?Precautions ?Precautions: Fall ?Precaution Comments: [name], L weakness, L coordination deficit ?Restrictions ?Weight Bearing Restrictions: No ? ?Pain: ?  See session notes ?ADL: See Care Tool for more details. ? ? ?Therapy/Group: Individual Therapy ? ?Volanda Napoleon MS, OTR/L ? ?03/06/2022, 6:54 AM  ?

## 2022-03-06 NOTE — Progress Notes (Signed)
Speech Language Pathology Weekly Progress and Session Note ? ?Patient Details  ?Name: Vernon [name] ?MRN: [id number] ?Date of Birth: 02-15-1946 ? ?Beginning of progress report period: February 27, 2022 ?End of progress report period: March 06, 2022 ? ?Today's Date: 03/06/2022 ?SLP Individual Time: 7209-4709 ?SLP Individual Time Calculation (min): 61 min ? ?Short Term Goals: ?Week 1: SLP Short Term Goal 1 (Week 1): Pt will participate in PO trials with SLP only (HTL and puree) with minimal s/s aspiration provided mod A multimodal cues for swallow strategies ?SLP Short Term Goal 1 - Progress (Week 1): Met ?SLP Short Term Goal 2 (Week 1): Pt will increase orientation to biographical info, place, time and recent events provided mod A multimodal cues ?SLP Short Term Goal 2 - Progress (Week 1): Met ?SLP Short Term Goal 3 (Week 1): Pt will increase speech intelligibility at phrase/simple sentence level to 80% with mod A multimodal cues ?SLP Short Term Goal 3 - Progress (Week 1): Met ?SLP Short Term Goal 4 (Week 1): Pt will solve functional problems with 50% accuracy provided mod A cues ?SLP Short Term Goal 4 - Progress (Week 1): Met ?SLP Short Term Goal 5 (Week 1): Pt will increase sustained attention to functional tasks to 5-7 minutes provided mod A cues ?SLP Short Term Goal 5 - Progress (Week 1): Met ?SLP Short Term Goal 6 (Week 1): Pt will follow 2-step directions during functional tasks with 70% accuracy min cues ?SLP Short Term Goal 6 - Progress (Week 1): Met ? ?New Short Term Goals: ?Week 2: SLP Short Term Goal 1 (Week 2): Pt will participate instrumental swallow assessment to determine appropriateness for PO diet ?SLP Short Term Goal 2 (Week 2): Pt will increase orientation to biographical info, place, time and recent events provided min A multimodal cues ?SLP Short Term Goal 3 (Week 2): Pt will increase speech intelligibility at the conversation level to >90% with sup A verbal cues ?SLP Short Term Goal 4 (Week 2): Pt will  complete basic problem solving with min A cues ?SLP Short Term Goal 5 (Week 2): Pt will increase sustained attention to functional tasks to 15 minutes provided min A cues ?SLP Short Term Goal 6 (Week 2): Pt will follow 2-step directions during functional tasks with 80% accuracy with min cues ? ?Weekly Progress Updates: Patient has made excellent gains and has met 6 of 6 STGs this reporting period. Progress was initially slow secondary to lethargy, however this has significantly improved and consequently resulted in more functional participation and improvement in all domains from a speech therapy perspective. Patient is currently completing cognitive tasks ranging from min-to-max A verbal/visual cues to complete functional and complex tasks accurately and safely. SLP continues to address orientation, sustained attention, basic problem solving, and functional recall. Patient's motor speech and verbal communication has improved significantly and patient is perceived as fully intelligible at the sentence level when alert. Patient currently remains NPO however has been participating in puree and honey thick liquid trials with minimal s/sx of aspiration. A modified barium swallow study (MBSS) is scheduled on 4/13 to further evaluate and determine appropriateness for PO diet initiation. Patient and family education is ongoing. Patient would benefit from continued skilled SLP intervention to maximize cognitive, speech, and swallow functioning and overall functional independence prior to discharge.   ?  ?Intensity: Minumum of 1-2 x/day, 30 to 90 minutes ?Frequency: 3 to 5 out of 7 days ?Duration/Length of Stay: 4/25 ?Treatment/Interventions: Cognitive remediation/compensation;Environmental controls;Internal/external aids;Speech/Language facilitation;Therapeutic Exercise;Therapeutic Activities;Patient/family education;Functional tasks;Dysphagia/aspiration  precaution training;Cueing hierarchy ? ?Daily Session ?Skilled  Therapeutic Interventions: Skilled ST treatment focused on cognitive-linguistic and swallowing goals. Patient performed oral care via suction toothbrush after set-up A. Patient consumed trials of honey thick liquids and puree via tsp with what appeared to be delayed AP transit, occasional delayed throat clearing, and multiple swallows. Recommend patient remain NPO; MBS scheduled for 4/13 for further assessment. Informed both patient and spouse who verbalized understanding and agreement with upcoming MBS. Patient was independently oriented to person and situation but required min A verbal cues to orient to place and time using external aids in room. SLP facilitated sustained attention and basic problem solving task with mod A verbal cues and redirection for 8 minute duration. Pt dialed spouse's number on home room phone with sup A verbal cues for dialing correct number. Patient's speech was perceived as 100% intelligible at the sentence level with mod I. Pt requested to use bathroom at end of session; required sup A verbal cues for following directions for use of Stedy +2 assist and transferred to bathroom. Pt was passed off to nurse and OT at end of session.  ? ?General  ?  ?Pain ?  ? ?Therapy/Group: Individual Therapy ? ?Eyanna Mcgonagle T Camauri Craton ?03/06/2022, 4:40 PM ? ? ? ? ? ? ?

## 2022-03-06 NOTE — Plan of Care (Signed)
?  Problem: RH Expression Communication ?Goal: LTG Patient will increase speech intelligibility (SLP) ?Description: LTG: Patient will increase speech intelligibility at word/phrase/conversation level with cues, % of the time (SLP) ?Flowsheets (Taken 03/06/2022 1642) ?LTG: Patient will increase speech intelligibility (SLP): Modified Independent ?Note: Goal upgraded due to consistent progress ?  ?

## 2022-03-06 NOTE — Progress Notes (Signed)
?                                                       PROGRESS NOTE ? ? ?Subjective/Complaints: ? ?No pain complaints no agitation. ? ?ROS- limited by cognition  ? ?ROS- +mild headache ? ?Objective: ?  ?No results found. ?No results for input(s): WBC, HGB, HCT, PLT in the last 72 hours. ? ?No results for input(s): NA, K, CL, CO2, GLUCOSE, BUN, CREATININE, CALCIUM in the last 72 hours. ? ? ?Intake/Output Summary (Last 24 hours) at 03/06/2022 1245 ?Last data filed at 03/06/2022 1000 ?Gross per 24 hour  ?Intake --  ?Output 751 ml  ?Net -751 ml  ? ?  ? ?  ? ?Physical Exam: ?Vital Signs ?Blood pressure 112/66, pulse 68, temperature 98.9 ?F (37.2 ?C), temperature source Oral, resp. rate 18, height '5\' 9"'$  (1.753 m), weight 89.7 kg, SpO2 92 %. ? ? ?General: No acute distress ?Mood and affect are appropriate ?Heart: Regular rate and rhythm no rubs murmurs or extra sounds ?Lungs: Clear to auscultation, breathing unlabored, no rales or wheezes ?Abdomen: Positive bowel sounds, soft nontender to palpation, nondistended ?Extremities: No clubbing, cyanosis, or edema ?Skin: No evidence of breakdown, no evidence of rash ? ?Skin: No evidence of breakdown, no evidence of rash ?Neurologic: Cranial nerves II through XII intact, motor strength is 5/5 in bilateral deltoid, bicep, tricep, grip, hip flexor, knee extensors, ankle dorsiflexor and plantar flexor ?Sensory exam normal sensation to light touch and proprioception in bilateral upper and lower extremities ?Cerebellar exam LUE ataxia ?Musculoskeletal: Full range of motion in all 4 extremities. No joint swelling ?Oriented to person only  ? ?Assessment/Plan: ?1. Functional deficits which require 3+ hours per day of interdisciplinary therapy in a comprehensive inpatient rehab setting. ?Physiatrist is providing close team supervision and 24 hour management of active medical problems listed below. ?Physiatrist and rehab team continue to assess barriers to discharge/monitor patient  progress toward functional and medical goals ? ?Care Tool: ? ?Bathing ? Bathing activity did not occur: Safety/medical concerns ?Body parts bathed by patient: Right arm, Left arm, Chest, Abdomen, Front perineal area, Right upper leg, Face, Left upper leg  ? Body parts bathed by helper: Left lower leg, Right lower leg, Buttocks ?  ?  ?Bathing assist Assist Level: Minimal Assistance - Patient > 75% ?  ?  ?Upper Body Dressing/Undressing ?Upper body dressing Upper body dressing/undressing activity did not occur (including orthotics): Safety/medical concerns ?What is the patient wearing?: Pull over shirt ?   ?Upper body assist Assist Level: Supervision/Verbal cueing ?   ?Lower Body Dressing/Undressing ?Lower body dressing ? ? ? Lower body dressing activity did not occur: Safety/medical concerns ?What is the patient wearing?: Pants, Incontinence brief ? ?  ? ?Lower body assist Assist for lower body dressing: Maximal Assistance - Patient 25 - 49% ?   ? ?Toileting ?Toileting    ?Toileting assist Assist for toileting: Dependent - Patient 0% ?  ?  ?Transfers ?Chair/bed transfer ? ?Transfers assist ? Chair/bed transfer activity did not occur: Safety/medical concerns ? ?Chair/bed transfer assist level: Minimal Assistance - Patient > 75% ?  ?  ?Locomotion ?Ambulation ? ? ?Ambulation assist ? ? Ambulation activity did not occur: Safety/medical concerns ? ?  ?  ?   ? ?Walk 10 feet activity ? ? ?Assist ? Walk  10 feet activity did not occur: Safety/medical concerns ? ?  ?   ? ?Walk 50 feet activity ? ? ?Assist Walk 50 feet with 2 turns activity did not occur: Safety/medical concerns ? ?  ?   ? ? ?Walk 150 feet activity ? ? ?Assist Walk 150 feet activity did not occur: Safety/medical concerns ? ?  ?  ?  ? ?Walk 10 feet on uneven surface  ?activity ? ? ?Assist Walk 10 feet on uneven surfaces activity did not occur: Safety/medical concerns ? ? ?  ?   ? ?Wheelchair ? ? ? ? ?Assist Is the patient using a wheelchair?: Yes ?Type of  Wheelchair: Manual ?Wheelchair activity did not occur: Safety/medical concerns ? ?  ?   ? ? ?Wheelchair 50 feet with 2 turns activity ? ? ? ?Assist ? ?  ?Wheelchair 50 feet with 2 turns activity did not occur: Safety/medical concerns ? ? ?   ? ?Wheelchair 150 feet activity  ? ? ? ?Assist ? Wheelchair 150 feet activity did not occur: Safety/medical concerns ? ? ?   ? ?Blood pressure 112/66, pulse 68, temperature 98.9 ?F (37.2 ?C), temperature source Oral, resp. rate 18, height '5\' 9"'$  (1.753 m), weight 89.7 kg, SpO2 92 %. ? ?Medical Problem List and Plan: ?1. 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            -patient may shower ?            -ELOS/Goals: 03/20/22  supervision to min assist goals with PT, OT and supervision with SLP ? Team conference today please see conference notes ?2.  Antithrombotics: ?-DVT/anticoagulation:  Pharmaceutical: Heparin initiated 02/12/2022 ?            -antiplatelet therapy: N/A ?3. Pain Management: Tylenol as needed ?4. Decreased arousal:  ?-continue Amantadine 100 mg twice daily for arousal ?            -adjust timing  ?-check sleep chart ?-consider adjustment to shunt settings if no further improvement? ?-urine culture never done. Will recheck on admit ?            -antipsychotic agents: Zyprexa via 2.5 mg twice daily which seems to have helped with agitation- may start to wean once pt stops trying to get OOB at noc  ?5. Neuropsych: This patient is capable of making decisions on his own behalf. ?6. Skin/Wound Care: Routine skin checks ?7. Fluids/Electrolytes/Nutrition: Routine in and outs with follow-up chemistries ?8.  Postop large right-sided pleural effusion.  Status postthoracentesis with 1250 cc yield.  Latest chest x-ray unremarkable ?9.  Seizure prophylaxis.  Keppra 500 mg twice  daily completed ?10.  Atrial fibrillation.  Eliquis currently on hold.  Neurology considering resuming in approximately 4 weeks.  Cardiac rate controlled ?11.  Diabetes mellitus.  Hemoglobin A1c 6.2.  NovoLog 2 units every 4 hours, Semglee 30 units daily ?CBG (last 3)  ?Recent Labs  ?  03/06/22 ?0408 03/06/22 ?0929 03/06/22 ?1218  ?GLUCAP 161* 173* 153*  ? ?Increase Semglee to 42U- controlled 03/06/22 ? ?12.  Chronic anemia.  Follow-up CBC ?13  Hypertension.  Continue Coreg 3.125 mg twice daily.  Monitor with increased mobility ?          ?Vitals:  ? 03/05/22 1926 03/06/22 0411  ?BP: 102/67 112/66  ?Pulse: 68 68  ?Resp: 18 18  ?  Temp: 99.2 ?F (37.3 ?C) 98.9 ?F (37.2 ?C)  ?SpO2: 91% 92%  ?Controlled 03/06/22 ?14.  Dysphagia.  Currently NPO.  Nasogastric tube feeds.  Dietary and speech therapy follow-up ?            Repeat MBS per SLP, if no improvement with MBS will need to discuss PEG placement with pt and wife  ?15. UTI with <100,000 E Coli. Keflex started. Sens to Keflex finish 7-day treatment ?  ? ?LOS: ?8 days ?A FACE TO FACE EVALUATION WAS PERFORMED ? ?Luanna Salk Mills Mitton ?03/06/2022, 12:45 PM  ? ? ? ?

## 2022-03-07 LAB — CBC
HCT: 25.5 % — ABNORMAL LOW (ref 39.0–52.0)
Hemoglobin: 7.9 g/dL — ABNORMAL LOW (ref 13.0–17.0)
MCH: 28.8 pg (ref 26.0–34.0)
MCHC: 31 g/dL (ref 30.0–36.0)
MCV: 93.1 fL (ref 80.0–100.0)
Platelets: 235 10*3/uL (ref 150–400)
RBC: 2.74 MIL/uL — ABNORMAL LOW (ref 4.22–5.81)
RDW: 27.5 % — ABNORMAL HIGH (ref 11.5–15.5)
WBC: 7.8 10*3/uL (ref 4.0–10.5)
nRBC: 0 % (ref 0.0–0.2)

## 2022-03-07 LAB — GLUCOSE, CAPILLARY
Glucose-Capillary: 135 mg/dL — ABNORMAL HIGH (ref 70–99)
Glucose-Capillary: 161 mg/dL — ABNORMAL HIGH (ref 70–99)
Glucose-Capillary: 171 mg/dL — ABNORMAL HIGH (ref 70–99)
Glucose-Capillary: 185 mg/dL — ABNORMAL HIGH (ref 70–99)
Glucose-Capillary: 194 mg/dL — ABNORMAL HIGH (ref 70–99)
Glucose-Capillary: 85 mg/dL (ref 70–99)

## 2022-03-07 MED ORDER — OLANZAPINE 5 MG PO TBDP
2.5000 mg | ORAL_TABLET | Freq: Every day | ORAL | Status: DC
  Administered 2022-03-08 – 2022-03-11 (×4): 2.5 mg via ORAL
  Filled 2022-03-07 (×4): qty 0.5

## 2022-03-07 NOTE — Progress Notes (Signed)
Patient ID: Vernon [name], male   DOB: 05/09/1946, 76 y.o.   MRN: [id number] ? ?Sw received follow up from patient [name]. SW informed that the patient has been approved for SN inside the home at discharge. The patient has been approved for 2.5 hours 5x a week. Sw inquired about other benefits or services that the patient is eligible for currently this is the only service available for the patient. Sw provided information to patient spouse.  ?

## 2022-03-07 NOTE — Progress Notes (Signed)
?                                                       PROGRESS NOTE ? ? ?Subjective/Complaints: ? ? ? ?ROS- limited by cognition  ? ?ROS- +mild headache ? ?Objective: ?  ?No results found. ?No results for input(s): WBC, HGB, HCT, PLT in the last 72 hours. ? ?No results for input(s): NA, K, CL, CO2, GLUCOSE, BUN, CREATININE, CALCIUM in the last 72 hours. ? ? ?Intake/Output Summary (Last 24 hours) at 03/07/2022 1012 ?Last data filed at 03/07/2022 0900 ?Gross per 24 hour  ?Intake 0 ml  ?Output 1050 ml  ?Net -1050 ml  ? ?  ? ?  ? ?Physical Exam: ?Vital Signs ?Blood pressure (!) 130/59, pulse 67, temperature 98.2 ?F (36.8 ?C), temperature source Oral, resp. rate 18, height '5\' 9"'$  (1.753 m), weight 85.1 kg, SpO2 96 %. ? ? ?General: No acute distress ?Mood and affect are appropriate ?Heart: Regular rate and rhythm no rubs murmurs or extra sounds ?Lungs: Clear to auscultation, breathing unlabored, no rales or wheezes ?Abdomen: Positive bowel sounds, soft nontender to palpation, nondistended ?Extremities: No clubbing, cyanosis, or edema ?Skin: No evidence of breakdown, no evidence of rash ? ?Skin: No evidence of breakdown, no evidence of rash ?Neurologic: Cranial nerves II through XII intact, motor strength is 5/5 in bilateral deltoid, bicep, tricep, grip, hip flexor, knee extensors, ankle dorsiflexor and plantar flexor ?Sensory exam normal sensation to light touch and proprioception in bilateral upper and lower extremities ?Cerebellar exam LUE ataxia ?Musculoskeletal: Full range of motion in all 4 extremities. No joint swelling ?Oriented to person only  ? ?Assessment/Plan: ?1. Functional deficits which require 3+ hours per day of interdisciplinary therapy in a comprehensive inpatient rehab setting. ?Physiatrist is providing close team supervision and 24 hour management of active medical problems listed below. ?Physiatrist and rehab team continue to assess barriers to discharge/monitor patient progress toward functional and  medical goals ? ?Care Tool: ? ?Bathing ? Bathing activity did not occur: Safety/medical concerns ?Body parts bathed by patient: Right arm, Left arm, Chest, Abdomen, Face  ? Body parts bathed by helper: Left lower leg, Right lower leg, Buttocks ?  ?  ?Bathing assist Assist Level: Supervision/Verbal cueing ?  ?  ?Upper Body Dressing/Undressing ?Upper body dressing Upper body dressing/undressing activity did not occur (including orthotics): Safety/medical concerns ?What is the patient wearing?: Pull over shirt ?   ?Upper body assist Assist Level: Supervision/Verbal cueing ?   ?Lower Body Dressing/Undressing ?Lower body dressing ? ? ? Lower body dressing activity did not occur: Safety/medical concerns ?What is the patient wearing?: Pants, Incontinence brief ? ?  ? ?Lower body assist Assist for lower body dressing: Moderate Assistance - Patient 50 - 74% ?   ? ?Toileting ?Toileting    ?Toileting assist Assist for toileting: Maximal Assistance - Patient 25 - 49% ?  ?  ?Transfers ?Chair/bed transfer ? ?Transfers assist ? Chair/bed transfer activity did not occur: Safety/medical concerns ? ?Chair/bed transfer assist level: Minimal Assistance - Patient > 75% ?  ?  ?Locomotion ?Ambulation ? ? ?Ambulation assist ? ? Ambulation activity did not occur: Safety/medical concerns ? ?  ?  ?   ? ?Walk 10 feet activity ? ? ?Assist ? Walk 10 feet activity did not occur: Safety/medical concerns ? ?  ?   ? ?  Walk 50 feet activity ? ? ?Assist Walk 50 feet with 2 turns activity did not occur: Safety/medical concerns ? ?  ?   ? ? ?Walk 150 feet activity ? ? ?Assist Walk 150 feet activity did not occur: Safety/medical concerns ? ?  ?  ?  ? ?Walk 10 feet on uneven surface  ?activity ? ? ?Assist Walk 10 feet on uneven surfaces activity did not occur: Safety/medical concerns ? ? ?  ?   ? ?Wheelchair ? ? ? ? ?Assist Is the patient using a wheelchair?: Yes ?Type of Wheelchair: Manual ?Wheelchair activity did not occur: Safety/medical concerns ? ?  ?    ? ? ?Wheelchair 50 feet with 2 turns activity ? ? ? ?Assist ? ?  ?Wheelchair 50 feet with 2 turns activity did not occur: Safety/medical concerns ? ? ?   ? ?Wheelchair 150 feet activity  ? ? ? ?Assist ? Wheelchair 150 feet activity did not occur: Safety/medical concerns ? ? ?   ? ?Blood pressure (!) 130/59, pulse 67, temperature 98.2 ?F (36.8 ?C), temperature source Oral, resp. rate 18, height '5\' 9"'$  (1.753 m), weight 85.1 kg, SpO2 96 %. ? ?Medical Problem List and Plan: ?1. 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            -patient may shower ?            -ELOS/Goals: 03/20/22  supervision to min assist goals with PT, OT and supervision with SLP ? Team conference today please see conference notes ?Long conversation with wife, she is feeling like she does not wish to put pt through a PEG if he fails his swallow ?Also has concerns about insurance coverage regarding SNF and hopes the [place] can fund SNF ?We discussed that if he does not pass MBS he would likely qualify for inpt hospice after rehab if feeding tube is d/ced and pt put on comfort feeds ?WIfe would also like to discuss with neuro about drug study and pros/cons of resuming eliquis or other anticoag  ?2.  Antithrombotics: ?-DVT/anticoagulation:  Pharmaceutical: Heparin initiated 02/12/2022 ?            -antiplatelet therapy: N/A ?3. Pain Management: Tylenol as needed ?4. Decreased arousal:  ?-continue Amantadine 100 mg twice daily for arousal ?            -adjust timing  ?-check sleep chart ?-consider adjustment to shunt settings if no further improvement? ?-urine culture never done. Will recheck on admit ?            -antipsychotic agents: Zyprexa via 2.5 mg twice daily which seems to have helped with agitation- may start to wean once pt stops trying to get  OOB at noc, still impulsive in PT ?5. Neuropsych: This patient is capable of making decisions on his own behalf. ?6. Skin/Wound Care: Routine skin checks ?7. Fluids/Electrolytes/Nutrition: Routine in and outs with follow-up chemistries ?8.  Postop large right-sided pleural effusion.  Status postthoracentesis with 1250 cc yield.  Latest chest x-ray unremarkable ?9.  Seizure prophylaxis.  Keppra 500 mg twice daily completed ?10.  Atrial fibrillation.  Eliquis currently on hold.  Neurology considering resuming in approximately 4 weeks.  Cardiac rate controlled ?11.  Diabetes mellitus.  Hemoglobin A1c 6.2.  NovoLog 2 units every 4 hours, Semglee 30 units daily ?CBG (last 3)  ?Recent  Labs  ?  03/06/22 ?2348 03/07/22 ?8115 03/07/22 ?7262  ?GLUCAP 150* 171* 161*  ? ?Increase Semglee to 42U- controlled 03/06/22 ? ?12.  Chronic anemia.  Follow-up CBC ?13  Hypertension.  Continue Coreg 3.125 mg twice daily.  Monitor with increased mobility ?          ?Vitals:  ? 03/06/22 1932 03/07/22 0350  ?BP: 124/68 (!) 130/59  ?Pulse: 63 67  ?Resp: 18 18  ?Temp: 98.3 ?F (36.8 ?C) 98.2 ?F (36.8 ?C)  ?SpO2: 96% 96%  ?Controlled 03/07/22 ?14.  Dysphagia.  Currently NPO.  Nasogastric tube feeds.  Dietary and speech therapy follow-up ?            Repeat MBS per SLP, if no improvement with MBS will need to discuss PEG placement with pt and wife  ?15. UTI with <100,000 E Coli. Keflex started. Sens to Keflex finish 7-day treatment ?  ? ?LOS: ?9 days ?A FACE TO FACE EVALUATION WAS PERFORMED ? ?Luanna Salk Jerris Fleer ?03/07/2022, 10:12 AM  ? ? ? ?

## 2022-03-07 NOTE — Progress Notes (Signed)
Physical Therapy Session Note ? ?Patient Details  ?Name: Vernon [name] ?MRN: [id number] ?Date of Birth: [birth year] ? ?Today's Date: 03/07/2022 ?PT Individual Time: 2536-6440 ?PT Individual Time Calculation (min): 72 min  ? ?Short Term Goals: ?Week 1:  PT Short Term Goal 1 (Week 1): Pt will perform bed mobility with consistent MinA. ?PT Short Term Goal 1 - Progress (Week 1): Met ?PT Short Term Goal 2 (Week 1): Pt will perform sit<>stand transfers with consistent ModA. ?PT Short Term Goal 2 - Progress (Week 1): Met ?PT Short Term Goal 3 (Week 1): Pt will perform stand pivot transfers with consistent ModA +1. ?PT Short Term Goal 3 - Progress (Week 1): Progressing toward goal ?PT Short Term Goal 4 (Week 1): Pt will initiate gait training. ?PT Short Term Goal 4 - Progress (Week 1): Met ? ?Skilled Therapeutic Interventions/Progress Updates: Pt presents sitting in recliner, w/ lab present.  Pt agreeable to therapy.  Pt transfers sit to stand w/ min A but verbal cues for forward lean to improve independence.  Pt transfers w/ B hands on arm rests w/ verbal cues.  Pt amb short distances w/ RW and min A until approaches seat and then requires Mod  A, attempting to sit before reaching seat.  Pt amb w/ 4# AW to LLE and improved step length and foot placement.  Pt requires verbal cues for posture.  Pt requires increased verbal cueing as approaches seat for posture and LLE advancement as attempts to sit before reaching full turn in front of seat.  Pt requires extended seated rest breaks 2/2 fatigue.  Pt performed multiple short distance gait up to 15-20' especially to practice safe approaches to seat from different angles.  Pt performed partial squats for improved LE strength and coordination w/ list to left and posterior.  Pt returned to recliner and remained sitting w/ chair alarm on and all needs in reach, spouse present at conclusion of session. ?   ? ?Therapy Documentation ?Precautions:  ?Precautions ?Precautions:  Fall ?Precaution Comments: [name], L weakness, L coordination deficit ?Restrictions ?Weight Bearing Restrictions: No ?General: ?  ?Vital Signs: ? ?Pain: ?Pain Assessment ?Pain Scale: 0-10 ?Pain Score: 0-No pain ?Mobility: ?  ? ? ? ? ?Therapy/Group: Individual Therapy ? ?Ladoris Gene ?03/07/2022, 12:13 PM  ?

## 2022-03-07 NOTE — Progress Notes (Signed)
Physical Therapy Session Note ? ?Patient Details  ?Name: Vernon [name] ?MRN: [id number] ?Date of Birth: 09-29-1946 ? ?Today's Date: 03/07/2022 ?PT Individual Time: 1504-1364 ?PT Individual Time Calculation (min): 40 min  ? ?Short Term Goals: ?Week 2:  PT Short Term Goal 1 (Week 2): Pt will perform bed mobility with consistent CGA. ?PT Short Term Goal 2 (Week 2): Pt will perform sit<>stand transfers with consistent MinA. ?PT Short Term Goal 3 (Week 2): Pt will perform stand pivot transfers with consistent ModA +1. ?PT Short Term Goal 4 (Week 2): Pt will ambulate 25f using LRAD with consistent MinA. ? ?Skilled Therapeutic Interventions/Progress Updates:  ? ?Pt received sitting in WC and agreeable to PT. Pt performed stand pivot transfer with RW and mod A* from PT for safety due to L lateral LOB. Ankle weight applied to LLE. ? ?[facility]. Prgait foot tap to target 2 x 5 BLE with BUE supported on RW with min assist from PT for improved posture and cues for decreased speed. .  ? ?Gait training with RW 2 x 846fwith mod assist from PT for safety and ankle weight on the LLE. Cues for step length, increased erect posture and safety with AD management ? ?Dynamic balance training with LUE coordination training to place and remove clothes pins from forward reach 3 x 6 with hand over hand assist for coordination.  ? ?Patient returned to room and performed stand pivot to recliner with min assist and RW. Pt left sitting in recliner with call bell in reach and all needs met.   ?  ? ? ?   ? ?Therapy Documentation ?Precautions:  ?Precautions ?Precautions: Fall ?Precaution Comments: [name], L weakness, L coordination deficit ?Restrictions ?Weight Bearing Restrictions: No ? ?Pain: ?denies ? ? ? ?Therapy/Group: Individual Therapy ? ?AuLorie Phenix4/10/2022, 6:07 PM  ?

## 2022-03-07 NOTE — Progress Notes (Signed)
Patient ID: Vernon [name], male   DOB: Feb 23, 1946, 76 y.o.   MRN: [id number] ? ?SW left [facility], [place] ?  ?P: [phone] Ext. [phone]  ?  ?  ?  ? ?

## 2022-03-07 NOTE — Progress Notes (Signed)
Speech Language Pathology Daily Session Note ? ?Patient Details  ?Name: Vernon [name] ?MRN: [id number] ?Date of Birth: 1946-08-12 ? ?Today's Date: 03/07/2022 ?SLP Individual Time: 0902-1000 ?SLP Individual Time Calculation (min): 58 min ? ?Short Term Goals: ?Week 2: SLP Short Term Goal 1 (Week 2): Pt will participate instrumental swallow assessment to determine appropriateness for PO diet ?SLP Short Term Goal 2 (Week 2): Pt will increase orientation to biographical info, place, time and recent events provided min A multimodal cues ?SLP Short Term Goal 3 (Week 2): Pt will increase speech intelligibility at the conversation level to >90% with sup A verbal cues ?SLP Short Term Goal 4 (Week 2): Pt will complete basic problem solving with min A cues ?SLP Short Term Goal 5 (Week 2): Pt will increase sustained attention to functional tasks to 15 minutes provided min A cues ?SLP Short Term Goal 6 (Week 2): Pt will follow 2-step directions during functional tasks with 80% accuracy with min cues ? ?Skilled Therapeutic Interventions: Skilled ST treatment focused on cognitive-linguistic and swallowing goals. SLP facilitated therapeutic PO trial of pureed textures and nectar thick liquids by spoon and cup sips with delayed AP transit as evidenced by possible lingual pumping. Pt reported "I'm letting it melt" therefore unsure if intentional or signs of lingual disorganization. Pt required at least 1 secondary swallow with each presentation and exhibited intermittent throat clearing, most consistently with pureed bolus vs. small cup sips of nectar thick liquids. Oral care was performed following trials with set-up A. Pt then consumed single ice chips x3 with intermittent delayed throat clear. SLP recommends continuation of NPO status with MBS scheduled for tomorrow morning. Recommend initiating water protocol with ice chips only on a limited basis (<5) following oral care to increase oral moisture and pleasure. Some impulsivity was  noted with consumption of ice chips therefore pt will need to be directly supervised by nurse/therapy/spouse. Updated staff communication, order in system, and notified MD/therapy team/nurse during conference. SLP facilitated basic-mildly complex problem solving, error awareness, recall, and sustained attention with novel card game "Uno." Patient required moderate A verbal cues to complete with increased errors and reduced carry over of correction as task progressed. Pt requested to use bathroom and was transferred using [place]. Pt was continent of bowel and bladder, however soiled brief and pants with urine due to improper positioning on the commode. SLP doffed/donned new brief and pants with max A. Returned pt to recliner chair with alarm activated and immediate needs within reach at end of session. Continue per current plan of care.   ?   ?Pain ?Pain Assessment ?Pain Scale: 0-10 ?Pain Score: 0-No pain ? ?Therapy/Group: Individual Therapy ? ?Laketia Vicknair T Jullian Clayson ?03/07/2022, 12:24 PM ?

## 2022-03-07 NOTE — Progress Notes (Signed)
Patient ID: Vernon [name], male   DOB: 1946/07/03, 76 y.o.   MRN: [id number] ? ?Team Conference Report to Patient/Family ? ?Team Conference discussion was reviewed with the patient and caregiver, including goals, any changes in plan of care and target discharge date.  Patient and caregiver express understanding and are in agreement.  The patient has a target discharge date of 03/20/22. ? ?Sw met with patient and spouse and provided team conference updates. Spouse concerned about patient potentially d/c home with PEG. The patient has a repeat modified set for tomorrow and sw will follow up with patient and spouse to determine plan for d/c. Spouse does not feel comfortable with patient d/c home with PEG. SW left VM for [facility] to determine other options. Sw will continue to follow up. ? ?Dyanne Iha ?03/07/2022, 1:06 PM  ?

## 2022-03-07 NOTE — Progress Notes (Signed)
Palliative: ? 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  He required intubation for airway protection and underwent emergent craniotomy on 3/18 with hematoma evacuation and drain placement.   Palliative care has been asked to get involved to further discuss goals of care in the setting of ICH and ongoing/worsening encephalopathy.    ? ?I met today at [facility] bedside along with wife, [name]. Cori is much more alert and voice is more clear today. He continues to have fluctuating mentation as he clearly asks [name] specific questions about the process of dealing with their finances but then also tells [place] that he is in [place], [place]. I spoke with [name] more as Nataniel rested regarding goals of care. We discussed improvement in his voice and plans for swallow study tomorrow. I think this will be important information to know to guide [place] on moving forward. Conversation with [name] and she still works and is unable to care for him 24/7. He will need placement. She has serious concerns regarding PEG placement and does not feel that this is something that Eann would desire. She does not feel that living in a facility with tube feeding is a quality of life that he would be happy with and she does not feel that he understands what this would look like for him. [name] would be more interested in optimizing his progress with SLP and then allow his body to do what it is able. We agree to discuss more after swallow study. My colleague Tacey Ruiz, NP will return to continue conversation Friday.  ? ?Update: I received call from [place]. She reports that the [place] has returned and said that he is eligible for 2.5 hrs of caregiver support 5x weekly. [name] is very anxious as this is not much support. She still works. [name] believes that they have reported that they will not cover  a facility placement. She does not know what she will do. I reassured her that we will continue to talk and work with her.  ? ?Exam: Awake, alert. Speech more clear. Still with confusion at times. No distress. He did have coughing episode once with ice chip (but tolerating ice chips previously). Abd soft. Moves all extremities - able to reposition himself in chair independently.  ? ?Plan: ?- Struggling with path forward. Anticipated discharge date April 25th per team. No good plan from CIR at this time.  ?- Family is against PEG placement. I was unable to have conversation with [name] regarding PEG as he lacks insight and ongoing confusion during visit.  ? ?55 min ? ?Vinie Sill, NP ?Palliative Medicine Team ?Pager [phone] (Please see [email or web address] for schedule) ?Team Phone [phone]  ? ? ?Greater than 50%  of this time was spent counseling and coordinating care related to the above assessment and plan   ?

## 2022-03-07 NOTE — Progress Notes (Signed)
Occupational Therapy Session Note ? ?Patient Details  ?Name: Vernon [name] ?MRN: [id number] ?Date of Birth: Oct 28, 1946 ? ?Today's Date: 03/07/2022 ?OT Individual Time: 4098-1191 ?OT Individual Time Calculation (min): 56 min  ? ? ?Short Term Goals: ?Week 1:  OT Short Term Goal 1 (Week 1): Pt will bathe UB seated at sink with min A. ?OT Short Term Goal 1 - Progress (Week 1): Met ?OT Short Term Goal 2 (Week 1): Pt will complete > 2 grooming task at sink with S. ?OT Short Term Goal 2 - Progress (Week 1): Not met ?OT Short Term Goal 3 (Week 1): Pt will complete sit to stand with min A in prep for standing ADL. ?OT Short Term Goal 3 - Progress (Week 1): Not met ?OT Short Term Goal 4 (Week 1): Pt will complete toilet transfer with max A and LRAD. ?OT Short Term Goal 4 - Progress (Week 1): Not met ?Week 2:  OT Short Term Goal 1 (Week 2): Pt will complete toilet transfer with max A and LRAD. ?OT Short Term Goal 2 (Week 2): Pt will complete sit to stand with min A in prep for standing ADL. ?OT Short Term Goal 3 (Week 2): Pt will complete > 2 grooming task at sink with S. ?OT Short Term Goal 4 (Week 2): Pt will utilize external aids to with no more than mod cuing to orient self to location. ? ?Skilled Therapeutic Interventions/Progress Updates:  ?  Pt received semi-reclined in bed, easily awakened to voice and alert, no c/o pain, agreeable to therapy. Session focus on self-care retraining, activity tolerance, RUE NMR in prep for improved ADL/IADL/func mobility performance + decreased caregiver burden. Came to sitting EOB with min A to progress LLE off bed. Requesting to use bathroom. Stand-pivot to and from w/c with RW and mod A for upright posture/keeping L hand on RW/and for RW management due to mild impulsivity. Completed toilet transfer in similar manner with LUE on grab bar and no AD. Max A overall to manage clothing, S for seated pericare post continent void of bladder.  ? ?Bathed UB at sink, applied deodorant, and donned  shirt with S.  ? ?Mod A to thread BLE into brief/shorts, min A for sit to stand at sink with BUE support on sink. Pt able to doff socks and don/tie shoes with only S and increased time this date. Total A to don teds.  ? ?Pt participated in various therex to target LUE NMR/ataxia including: rolling theraputty into log shape/ball, removing large beads, and placing small foam sponges into small hole in cup. Pt able to recall and write down these therex into his memory notebook.  ? ?Pt left seated in recliner with safety belt alarm engaged, call bell in reach, and all immediate needs met.  ? ? ?Therapy Documentation ?Precautions:  ?Precautions ?Precautions: Fall ?Precaution Comments: [name], L weakness, L coordination deficit ?Restrictions ?Weight Bearing Restrictions: No ? ?Pain: no c/o ?ADL: See Care Tool for more details. ? ? ?Therapy/Group: Individual Therapy ? ?Volanda Napoleon MS, OTR/L ? ? ?03/07/2022, 6:52 AM ?

## 2022-03-08 ENCOUNTER — Inpatient Hospital Stay (HOSPITAL_COMMUNITY): Payer: Medicare Other

## 2022-03-08 LAB — GLUCOSE, CAPILLARY
Glucose-Capillary: 113 mg/dL — ABNORMAL HIGH (ref 70–99)
Glucose-Capillary: 193 mg/dL — ABNORMAL HIGH (ref 70–99)
Glucose-Capillary: 140 mg/dL — ABNORMAL HIGH (ref 70–99)
Glucose-Capillary: 75 mg/dL (ref 70–99)
Glucose-Capillary: 175 mg/dL — ABNORMAL HIGH (ref 70–99)

## 2022-03-08 NOTE — Patient Care Conference (Signed)
Inpatient RehabilitationTeam Conference and Plan of Care Update ?Date: 03/07/2022   Time: 10:12 AM  ? ? ?Patient Name: Vernon [name]      ?Medical Record Number: [id number]  ?Date of Birth: Aug 06, 1946 ?Sex: Male         ?Room/Bed: 0F74B/4W96P-59 ?Payor Info: Payor: [employer] / Plan: [facility] MEDICARE / Product Type: *No Product type* /   ? ?Admit Date/Time:  02/26/2022  5:04 PM ? ?Primary Diagnosis:  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 hemorrhage of brain (HCC) ? ?Hospital Problems: Principal Problem: ?  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 hemorrhage of brain ([place]) ? ? ? ?Expected Discharge Date: Expected Discharge Date: 03/20/22 ? ?Team Members Present: ?Physician leading conference: Dr. Alysia Penna ?Social Worker Present: Erlene Quan, BSW ?Nurse Present: Dorien Chihuahua, RN ?PT Present: Barrie Folk, PT ?OT Present: Providence Lanius, OT ?SLP Present: Sherren Kerns, SLP ?PPS Coordinator present : Gunnar Fusi, SLP ? ?   Current Status/Progress Goal Weekly Team Focus  ?Bowel/Bladder ? ?   Incontinent of bowel and bladder with toileting   Incontinence managed   Toileting protocol  ?Swallow/Nutrition/ Hydration ? ? NPO via [name]; MBS scheduled Thursday 4/13  Sup A for least restrictive diet  MBS Thursday   ?ADL's ? ? S UB dressing/bathing at sink, mod stand-pivot toilet transfer, S grooming/oral care; mod A LBD/bathing at sink, max A toileting tasks; much improved alertness/therapy participation/orientation, mild impulsivity with mobility + LUE ataxia but incorporating into bimanual ADL  S to min A, 24/7 S  LUE NMR, cog, self-care/transfer/balance retraining, pt/family/DME/ AE education   ?Mobility ? ? Improving cognition; Bed mobility = Min/Mod A; Sit <>stand MinA, squat pivot = Min/Mod A; Amb up to 75' with hallway HR and light MinA, with RW 26' with MinA and int ModA for balance when fatiguing, no stair training yet; slight impulsivity noted when fatiguing during mobility  Bed mobility with CGA, transfers with CGA, ambulation  with CGA/ MinA  L hemibody NMR, standing balance, transfer and gait training, FAMILY EDUCATION (concerned with wife's lack of perception of pt's progress)   ?Communication ? ? mod I  sup A  speech intelligibility; likely d/c goal due to progress   ?Safety/Cognition/ Behavioral Observations ? ranges from min-to-max A  Supervision A  orientation, sustained attention, functional problem solving, recall   ?Pain ? ?   N/A        ?Skin ? ?   N/A        ? ? ?Discharge Planning:  ?discharging back home with spouse   ?Team Discussion: ?Patient is more alert but continues to display impulsivity and fluctuating orientation of time and place. Requires assistance with problem solving and complex tasks and short term recall. MD weaning meds. ? ?Patient on target to meet rehab goals: ?yes, currently needs supervision for upper body care at the sink and mod assist for stand pivot transfers. Requires mod assist for upper body bathing and dressing and mod assist for toileting. Completes sit - stand and squat pivots with min - mod assist and able to ambulate up to 27' with a RW and min - mod assist. Goals for discharge set for supervision overall and min assist for ambulation. ? ?*See Care Plan and progress notes for long and short-term goals.  ? ?Revisions to Treatment Plan:  ?Ice chip trial/Water protocol ?MBS scheduled 03/08/22; hopeful for conservative diet vs PEG instead of cortrak ?  ?Teaching Needs: ?Safety, transfers, nutritional means, medications, etc  ?Current Barriers to Discharge: ?Decreased caregiver support ? ?Possible Resolutions to Barriers: ?Family education ?  24/7 supervision recommended ?HH follow up services ?  ? ? Medical Summary ?Current Status: dysarthria much improved, still impulsive but less agitated, started on H20 protocol ? Barriers to Discharge: Nutrition means;Medical stability ?  ?Possible Resolutions to [employer] Focus: repeat MBS, wean Zyprexa, determine need for PEG ? ? ?Continued Need for Acute  Rehabilitation Level of Care: The patient requires daily medical management by a physician with specialized training in physical medicine and rehabilitation for the following reasons: ?Direction of a multidisciplinary physical rehabilitation program to maximize functional independence : Yes ?Medical management of patient stability for increased activity during participation in an intensive rehabilitation regime.: Yes ?Analysis of laboratory values and/or radiology reports with any subsequent need for medication adjustment and/or medical intervention. : Yes ? ? ?I attest that I was present, lead the team conference, and concur with the assessment and plan of the team. ? ? ?[name] ?03/08/2022, 9:00 AM  ? ? ? ? ? ? ?

## 2022-03-08 NOTE — Progress Notes (Signed)
Notified that family pursuing Hospice ?Discussed with wife over the phone ?Pt currently on TF, prognosis for recovery of oral feeds is good but may take months to normalize ?Overall he is making progress as expected after his cerebellar bleed .  Wife has concerns about additional strokes in the future either hemorrhagic or ischemic and that she would not want him to suffer in that way.   ? ?We discussed that while on rehab will cont to work on deficit areas, cont TF and meds until transferred to inpt rehab ? ? ?

## 2022-03-08 NOTE — Progress Notes (Signed)
Occupational Therapy Session Note ? ?Patient Details  ?Name: Vernon [name] ?MRN: [id number] ?Date of Birth: [birth year] ? ?Today's Date: 03/08/2022 ?OT Individual Time: 3474-2595 ?OT Individual Time Calculation (min): 58 min  ? ? ?Short Term Goals: ?Week 1:  OT Short Term Goal 1 (Week 1): Pt will bathe UB seated at sink with min A. ?OT Short Term Goal 1 - Progress (Week 1): Met ?OT Short Term Goal 2 (Week 1): Pt will complete > 2 grooming task at sink with S. ?OT Short Term Goal 2 - Progress (Week 1): Not met ?OT Short Term Goal 3 (Week 1): Pt will complete sit to stand with min A in prep for standing ADL. ?OT Short Term Goal 3 - Progress (Week 1): Not met ?OT Short Term Goal 4 (Week 1): Pt will complete toilet transfer with max A and LRAD. ?OT Short Term Goal 4 - Progress (Week 1): Not met ?Week 2:  OT Short Term Goal 1 (Week 2): Pt will complete toilet transfer with max A and LRAD. ?OT Short Term Goal 2 (Week 2): Pt will complete sit to stand with min A in prep for standing ADL. ?OT Short Term Goal 3 (Week 2): Pt will complete > 2 grooming task at sink with S. ?OT Short Term Goal 4 (Week 2): Pt will utilize external aids to with no more than mod cuing to orient self to location. ? ?Skilled Therapeutic Interventions/Progress Updates:  ?  Pt received asleep in bed but easily awakened to voice, no c/o pain, agreeable to therapy. Session focus on self-care retraining, activity tolerance, transfer retraining in prep for improved ADL/IADL/func mobility performance + decreased caregiver burden. ? ?Requesting to go to bathroom. Came to sitting EOB with S after increased time to rouse with use of bed rail. Stedy transfer with S > toilet due to urgency, pt able to pull down brief himself with CGA. Continent void of loose stool. Mod A for thoroughness of posterior pericare in stedy, CGA for balance and RUE support on rail. Stedy transfer > TTB in shower. ? ?Bathed UB and upper BLE with S and Vcs to recall to use soap/scrub body,  washed hair with S. Reports being "worn out." ? ?Donned shirt and deodorant with S, mod A to pull up brief/pants around hips, total A to don Teds, and S to don B shoes and tie time.  ? ?Pt able to recall that he took a shower to write in his memory notebook, also recalled that he is going for a swallow test today independently. ? ?Pt participated in water protocol with ice chips per SLP instructions, occasional throat clearing x2 with 5 ice chips. Oral care completed with suction toothbrush prior to consumption with S. ? ?Pt left seated in recliner with telesitter on with safety belt alarm engaged, call bell in reach, and all immediate needs met.  ? ? ?Therapy Documentation ?Precautions:  ?Precautions ?Precautions: Fall ?Precaution Comments: [name], L weakness, L coordination deficit ?Restrictions ?Weight Bearing Restrictions: No ? ?Pain: no c/o ?  ?ADL: See Care Tool for more details. ? ? ?Therapy/Group: Individual Therapy ? ?Volanda Napoleon MS, OTR/L ? ?03/08/2022, 6:50 AM ?

## 2022-03-08 NOTE — Progress Notes (Signed)
Physical Therapy Session Note ? ?Patient Details  ?Name: Vernon [name] ?MRN: [id number] ?Date of Birth: [birth year] ? ?Today's Date: 03/08/2022 ? ?Short Term Goals: ? ?Week 2:  PT Short Term Goal 1 (Week 2): Pt will perform bed mobility with consistent CGA. ?PT Short Term Goal 2 (Week 2): Pt will perform sit<>stand transfers with consistent MinA. ?PT Short Term Goal 3 (Week 2): Pt will perform stand pivot transfers with consistent ModA +1. ?PT Short Term Goal 4 (Week 2): Pt will ambulate 84f using LRAD with consistent MinA. ? ? ?Skilled Therapeutic Interventions/Progress Updates:  ? ?Pt received sitting in recliner reporting extreme fatigue and requesting to rest for the remainder of the day. PT encouraged ADLs or transfer to bed, but pt declined. Left sitting in recliner with wife present.  ?   ? ?Therapy Documentation ?Precautions:  ?Precautions ?Precautions: Fall ?Precaution Comments: [name], L weakness, L coordination deficit ?Restrictions ?Weight Bearing Restrictions: No ?General: ? Missed time: 45 min fatigue.  ?Vital Signs: ?Therapy Vitals ?Temp: 97.6 ?F (36.4 ?C) ?Pulse Rate: (!) 56 ?Resp: 18 ?BP: 120/69 ?Patient Position (if appropriate): Sitting ?Oxygen Therapy ?SpO2: 97 % ?O2 Device: Room Air ?Pain: ?denies ? ? ?Therapy/Group: Individual Therapy ? ?ALorie Phenix?03/08/2022, 3:34 PM  ?

## 2022-03-08 NOTE — Progress Notes (Signed)
Palliative: ? 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  He required intubation for airway protection and underwent emergent craniotomy on 3/18 with hematoma evacuation and drain placement.   Palliative care has been asked to get involved to further discuss goals of care in the setting of ICH and ongoing/worsening encephalopathy.    ? ?Our team was called by patient's wife early this morning requesting involvement in alerting [place] that family would like to move forward with a referral to hospice. ? ?I met today at [facility] bedside.  Vernon [name] is sitting up in the chair requesting ice chips-a few ice chips provided which Vernon [name] was able to consume.  No coughing with ice chips, some throat clearing noted.  During my interaction with Vernon [name] it is evident he remains confused-he is unsure where we are.  He had difficulty focusing on conversation.  I did not feel Vernon [name] was able to participate in decision-making as it did not seem he had the capacity to understand implications of his decisions.  Wife requested that we speak outside of the room. ? ?Separately I spoke with patient's wife and daughter.  We reviewed the situation.  Wife shares her concerns that the medical interventions Vernon [name] has already undergone violate what he would have wanted for himself.  She feels that Vernon [name] will not return to his baseline and that his new baseline is not a quality of life he would be willing to accept.  She understands he is making some improvements with his work with OT and PT however she is mostly concerned about ongoing poor cognition; in fact she feels that his cognition now is much worse than when it was when he was still in acute care.   ? ?We discussed his ongoing dependence on tube feeding.  Wife does not want Vernon [name] to undergo any more swallow studies and is adamant that he  would never want a PEG tube.  She would like to free Vernon [name] from artificial nutrition and allow him bites and sips what ever he chooses; she accepts risks of aspiration and also accepts risk of malnutrition. ? ?We discussed hospice philosophy of care and type of care provided.  We discussed focus on comfort and quality of life.  We discussed that Vernon [name] would not continue with aggressive medical interventions such as tube feeding.  Wife is very much in agreement with this type of care.  We discussed that when tube feeding is discontinued [place] may consume small bites and sips as he chooses however we except that Vernon [name] is high risk for aspiration.  We also discussed that Vernon [name] may not be able to consume much intake, certainly not enough to sustain himself which would lead to poor prognosis. ? ?Exam: Awake, alert. Speech clear. Still with confusion and some disorientation. No distress.  Respirations regular.  [name] remains in place with tube feed infusing. ? ?Plan: ?Patient's wife and daughter clear and their goals for hospice care and comfort measures only ?Wife and daughter both understand that tube feeding will not be continued at hospice and they agree with this saying that patient would not want to live with artificial nutrition any longer ?Wife and daughter understand patient is at risk for aspiration and unable to support himself nutritionally at this time ?Transfer to hospice facility when bed available ? ? ?Juel Burrow, DNP, AGNP-C ?Palliative Medicine Team ?Team Phone #  [phone]  ?Pager # [phone] ? ? ?

## 2022-03-08 NOTE — Progress Notes (Signed)
?                                                       PROGRESS NOTE ? ? ?Subjective/Complaints: ? ?Pt without c/os, pleasant and cooperative  ? ?ROS- limited by cognition  ? ? ?Objective: ?  ?No results found. ?Recent Labs  ?  03/07/22 ?0959  ?WBC 7.8  ?HGB 7.9*  ?HCT 25.5*  ?PLT 235  ? ? ?No results for input(s): NA, K, CL, CO2, GLUCOSE, BUN, CREATININE, CALCIUM in the last 72 hours. ? ? ?Intake/Output Summary (Last 24 hours) at 03/08/2022 0944 ?Last data filed at 03/08/2022 0029 ?Gross per 24 hour  ?Intake --  ?Output 600 ml  ?Net -600 ml  ? ?  ? ?  ? ?Physical Exam: ?Vital Signs ?Blood pressure 126/74, pulse 63, temperature 98.2 ?F (36.8 ?C), resp. rate 15, height '5\' 9"'$  (1.753 m), weight 85.1 kg, SpO2 95 %. ? ? ?General: No acute distress ?Mood and affect are appropriate ?Heart: Regular rate and rhythm no rubs murmurs or extra sounds ?Lungs: Clear to auscultation, breathing unlabored, no rales or wheezes ?Abdomen: Positive bowel sounds, soft nontender to palpation, nondistended ?Extremities: No clubbing, cyanosis, or edema ?Skin: No evidence of breakdown, no evidence of rash ? ?Skin: No evidence of breakdown, no evidence of rash ?Neurologic: Cranial nerves II through XII intact, motor strength is 5/5 in bilateral deltoid, bicep, tricep, grip, hip flexor, knee extensors, ankle dorsiflexor and plantar flexor ?Sensory exam normal sensation to light touch and proprioception in bilateral upper and lower extremities ?Cerebellar exam LUE ataxia ?Musculoskeletal: Full range of motion in all 4 extremities. No joint swelling ?Oriented to person only  ? ?Assessment/Plan: ?1. Functional deficits which require 3+ hours per day of interdisciplinary therapy in a comprehensive inpatient rehab setting. ?Physiatrist is providing close team supervision and 24 hour management of active medical problems listed below. ?Physiatrist and rehab team continue to assess barriers to discharge/monitor patient progress toward functional and  medical goals ? ?Care Tool: ? ?Bathing ? Bathing activity did not occur: Safety/medical concerns ?Body parts bathed by patient: Right arm, Left arm, Chest, Abdomen, Face  ? Body parts bathed by helper: Left lower leg, Right lower leg, Buttocks ?  ?  ?Bathing assist Assist Level: Supervision/Verbal cueing ?  ?  ?Upper Body Dressing/Undressing ?Upper body dressing Upper body dressing/undressing activity did not occur (including orthotics): Safety/medical concerns ?What is the patient wearing?: Pull over shirt ?   ?Upper body assist Assist Level: Supervision/Verbal cueing ?   ?Lower Body Dressing/Undressing ?Lower body dressing ? ? ? Lower body dressing activity did not occur: Safety/medical concerns ?What is the patient wearing?: Pants, Incontinence brief ? ?  ? ?Lower body assist Assist for lower body dressing: Moderate Assistance - Patient 50 - 74% ?   ? ?Toileting ?Toileting    ?Toileting assist Assist for toileting: Maximal Assistance - Patient 25 - 49% ?  ?  ?Transfers ?Chair/bed transfer ? ?Transfers assist ? Chair/bed transfer activity did not occur: Safety/medical concerns ? ?Chair/bed transfer assist level: Minimal Assistance - Patient > 75% ?  ?  ?Locomotion ?Ambulation ? ? ?Ambulation assist ? ? Ambulation activity did not occur: Safety/medical concerns ? ?  ?  ?   ? ?Walk 10 feet activity ? ? ?Assist ? Walk 10 feet activity did not occur:  Safety/medical concerns ? ?  ?   ? ?Walk 50 feet activity ? ? ?Assist Walk 50 feet with 2 turns activity did not occur: Safety/medical concerns ? ?  ?   ? ? ?Walk 150 feet activity ? ? ?Assist Walk 150 feet activity did not occur: Safety/medical concerns ? ?  ?  ?  ? ?Walk 10 feet on uneven surface  ?activity ? ? ?Assist Walk 10 feet on uneven surfaces activity did not occur: Safety/medical concerns ? ? ?  ?   ? ?Wheelchair ? ? ? ? ?Assist Is the patient using a wheelchair?: Yes ?Type of Wheelchair: Manual ?Wheelchair activity did not occur: Safety/medical concerns ? ?  ?    ? ? ?Wheelchair 50 feet with 2 turns activity ? ? ? ?Assist ? ?  ?Wheelchair 50 feet with 2 turns activity did not occur: Safety/medical concerns ? ? ?   ? ?Wheelchair 150 feet activity  ? ? ? ?Assist ? Wheelchair 150 feet activity did not occur: Safety/medical concerns ? ? ?   ? ?Blood pressure 126/74, pulse 63, temperature 98.2 ?F (36.8 ?C), resp. rate 15, height '5\' 9"'$  (1.753 m), weight 85.1 kg, SpO2 95 %. ? ?Medical Problem List and Plan: ?1. 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            -patient may shower ?            -ELOS/Goals: 03/20/22  supervision to min assist goals with PT, OT and supervision with SLP ? Team conference today please see conference notes ?Long conversation with wife, she is feeling like she does not wish to put pt through a PEG if he fails his swallow ?Also has concerns about insurance coverage regarding SNF and hopes the [place] can fund SNF ?We discussed that if he does not pass MBS he would likely qualify for inpt hospice after rehab if feeding tube is d/ced and pt put on comfort feeds ?WIfe would also like to discuss with neuro about drug study and pros/cons of resuming eliquis or other anticoag  ?Wife is pursuing hospice ? Home vs inpt, will see how pt does with comfort feeds in that setting , if swallow has improved may have prolonged survival  ?2.  Antithrombotics: ?-DVT/anticoagulation:  Pharmaceutical: Heparin initiated 02/12/2022 ?            -antiplatelet therapy: N/A ?3. Pain Management: Tylenol as needed ?4. Decreased arousal:  ?-continue Amantadine 100 mg twice daily for arousal ?            -adjust timing  ?-check sleep chart ?-consider adjustment to shunt settings if no further improvement? ?-urine culture never done. Will recheck on admit ?            -antipsychotic  agents: Zyprexa via 2.5 mg twice daily which seems to have helped with agitation- may start to wean once pt stops trying to get OOB at noc, still impulsive in PT ?5. Neuropsych: This patient is not capable of making decisions on his own behalf. ?6. Skin/Wound Care: Routine skin checks ?7. Fluids/Electrolytes/Nutrition: Routine in and outs with follow-up chemistries ?8.  Postop large right-sided pleural effusion.  Status postthoracentesis with 1250 cc yield.  Latest chest x-ray unremarkable ?9.  Seizure prophylaxis.  Keppra 500 mg twice daily completed ?10.  Atrial fibrillation.  Eliquis currently on hold.  Neurology considering resuming in approximately 4 weeks.  Cardiac rate controlled ?11.  Diabetes mellitus.  Hemoglobin A1c 6.2.  NovoLog 2 units every 4 hours, Semglee 30 units daily ?CBG (last 3)  ?Recent Labs  ?  03/07/22 ?2348 03/08/22 ?0357 03/08/22 ?0840  ?GLUCAP 135* 113* 193*  ? ?Increase Semglee to 42U- controlled 03/08/22, if NG tube stop will need to reduce Semglee to 10U  ? ?12.  Chronic anemia.  Follow-up CBC ?13  Hypertension.  Continue Coreg 3.125 mg twice daily.  Monitor with increased mobility ?          ?Vitals:  ? 03/07/22 2007 03/08/22 0358  ?BP: 113/68 126/74  ?Pulse: 65 63  ?Resp: 16 15  ?Temp: 98.1 ?F (36.7 ?C) 98.2 ?F (36.8 ?C)  ?SpO2: 96% 95%  ?Controlled 03/07/22 ?14.  Dysphagia.  Currently NPO.  Nasogastric tube feeds.  Dietary and speech therapy follow-up ?            Repeat MBS per SLP, if no improvement with MBS will need to discuss PEG placement with pt and wife  ?15. UTI with <100,000 E Coli. Keflex started. Sens to Keflex finish 7-day treatment ?  ? ?LOS: ?10 days ?A FACE TO FACE EVALUATION WAS PERFORMED ? ?Luanna Salk Ari Engelbrecht ?03/08/2022, 9:44 AM  ? ? ? ?

## 2022-03-08 NOTE — Progress Notes (Signed)
[employer] [facility]) Hospital Liaison note.  ?  ?Received request from Kathie Rhodes, NP  for family interest in [facility]. Chart under review. Spoke with family to confirm interest and explain services.  ? ?Hospice home is unable to offer a room today. Hospital will follow up tomorrow regarding availability and eligibility for transfer.  ? ?Thank you,    ?Farrel Gordon, RN, CCM      ?[facility] Hospital Liaison  ?[phone] ?

## 2022-03-08 NOTE — Progress Notes (Signed)
Physical Therapy Session Note ? ?Patient Details  ?Name: Vernon [name] ?MRN: [id number] ?Date of Birth: 1945/12/06 ? ?Today's Date: 03/08/2022 ?PT Individual Time: 1105-1200 ?PT Individual Time Calculation (min): 55 min  ? ?Short Term Goals: ? ?Week 2:  PT Short Term Goal 1 (Week 2): Pt will perform bed mobility with consistent CGA. ?PT Short Term Goal 2 (Week 2): Pt will perform sit<>stand transfers with consistent MinA. ?PT Short Term Goal 3 (Week 2): Pt will perform stand pivot transfers with consistent ModA +1. ?PT Short Term Goal 4 (Week 2): Pt will ambulate 64ft using LRAD with consistent MinA. ? ? ?Skilled Therapeutic Interventions/Progress Updates:  ? ?Pt received sitting in WC and agreeable to PT. Pt performed ambulatory transfer with RW and min assist and moderate cues for safety and improved posture. ? ?WC mobility to force use of LUE x 239ft with min assist from PT to prevent veer to the L and increased ROM intermittently.  ? ?Dynamic balance and coordination training on BITS visual scanning user paced and sequencing x 2 each with BUE and min assist initially on the LUE progressing to supervision assist with cues for improved sequence retention. ? ?Pt noted to have nausea lasting 2 minutes and Emesis. Once nausea subsided, Gait training performed with RW, L ankle weihgt and mod assist from PT due to lateral lean L and for improved AD management x 47ft.  ? ?Patient returned to room and performed ambulatory transfer to recliner with min-mod assist and RW. Pt left sitting in recliner with call bell in reach and all needs met.   ? ? ?   ? ?Therapy Documentation ?Precautions:  ?Precautions ?Precautions: Fall ?Precaution Comments: [name], L weakness, L coordination deficit ?Restrictions ?Weight Bearing Restrictions: No ?  ?Pain: denies ? ? ?Therapy/Group: Individual Therapy ? ?Lorie Phenix ?03/08/2022, 1:04 PM  ?

## 2022-03-08 NOTE — Progress Notes (Signed)
Occupational Therapy Session Note ? ?Patient Details  ?Name: Vernon [name] ?MRN: [id number] ?Date of Birth: 10/20/1946 ? ?Today's Date: 03/08/2022 ?OT Individual Time: 1302-1348 ?OT Individual Time Calculation (min): 46 min  ? ? ?Short Term Goals: ?Week 1:  OT Short Term Goal 1 (Week 1): Pt will bathe UB seated at sink with min A. ?OT Short Term Goal 1 - Progress (Week 1): Met ?OT Short Term Goal 2 (Week 1): Pt will complete > 2 grooming task at sink with S. ?OT Short Term Goal 2 - Progress (Week 1): Not met ?OT Short Term Goal 3 (Week 1): Pt will complete sit to stand with min A in prep for standing ADL. ?OT Short Term Goal 3 - Progress (Week 1): Not met ?OT Short Term Goal 4 (Week 1): Pt will complete toilet transfer with max A and LRAD. ?OT Short Term Goal 4 - Progress (Week 1): Not met ?Week 2:  OT Short Term Goal 1 (Week 2): Pt will complete toilet transfer with max A and LRAD. ?OT Short Term Goal 2 (Week 2): Pt will complete sit to stand with min A in prep for standing ADL. ?OT Short Term Goal 3 (Week 2): Pt will complete > 2 grooming task at sink with S. ?OT Short Term Goal 4 (Week 2): Pt will utilize external aids to with no more than mod cuing to orient self to location. ? ?Patient met seated in recliner in agreement with OT treatment session. Reports soreness in L hand likely 2/2 ataxia with wc mobility. Heat pack applied with protective layers between skin and source of head. No s/s skin irritation or discoloration after application. OT treatment session with focus on self-care re-education, NMR and therapeutic activity as detailed below. Sit to stand from low recliner with multimodal cues for hand placement and safety. Stand-pivot to wc > BSC over standard height toilet with Mod A. 3/3 parts of toileting task with Mod A and cues for pacing. Able to self-propel wc to sink surface for hand hygiene. Wc mobility to dayroom without external assist and cues for attention to L with patient seemingly unaware of wc  veering. While heat pack on L hand, patient completed towel pushes in all directions with focus on gross motor movement. Patient then completed peg board activity in sitting with focus LUE NMR. Difficulty recognizing and recreating pattern. Total A for wc transport back to hospital room for time management. Session concluded with patient seated in recliner with call bell within reach, belt alarm activated and all needs met.  ? ?Therapy Documentation ?Precautions:  ?Precautions ?Precautions: Fall ?Precaution Comments: [name], L weakness, L coordination deficit ?Restrictions ?Weight Bearing Restrictions: No ?General: ?  ?Therapy/Group: Individual Therapy ? ? R Howerton-Davis ?03/08/2022, 1:55 PM ?

## 2022-03-08 NOTE — Progress Notes (Addendum)
Speech Language Pathology Daily Session Note ? ?Patient Details  ?Name: Vernon [name] ?MRN: [id number] ?Date of Birth: 1946/01/31 ? ?SLP was notified by Damian Leavell, RN via secure Epic chat regarding Spouse's wishes for patient to pursue Hospice care. Spouse declined consideration for a modified barium swallow study at this time therefore swallow study has been deferred until further notice. MD has been informed.  ?   ?Patty Sermons ?03/08/2022, 9:13 AM ?

## 2022-03-08 NOTE — Plan of Care (Signed)
Palliative consult; changed to comfort care. ?

## 2022-03-09 ENCOUNTER — Inpatient Hospital Stay: Admission: AD | Admit: 2022-03-09 | Payer: Medicare Other | Admitting: Internal Medicine

## 2022-03-09 LAB — GLUCOSE, CAPILLARY
Glucose-Capillary: 135 mg/dL — ABNORMAL HIGH (ref 70–99)
Glucose-Capillary: 136 mg/dL — ABNORMAL HIGH (ref 70–99)
Glucose-Capillary: 156 mg/dL — ABNORMAL HIGH (ref 70–99)
Glucose-Capillary: 170 mg/dL — ABNORMAL HIGH (ref 70–99)
Glucose-Capillary: 201 mg/dL — ABNORMAL HIGH (ref 70–99)
Glucose-Capillary: 74 mg/dL (ref 70–99)

## 2022-03-09 MED ORDER — FREE WATER: 200.0000 mL | Status: DC

## 2022-03-09 MED ORDER — PROSOURCE TF PO LIQD
45.0000 mL | Freq: Two times a day (BID) | ORAL | Status: DC
Start: 2022-03-09 — End: 2022-03-20

## 2022-03-09 MED ORDER — ALBUTEROL SULFATE (2.5 MG/3ML) 0.083% IN NEBU: 2.5000 mg | INHALATION_SOLUTION | RESPIRATORY_TRACT | 12 refills | Status: DC | PRN

## 2022-03-09 MED ORDER — INSULIN ASPART 100 UNIT/ML IJ SOLN: 2.0000 [IU] | INTRAMUSCULAR | 11 refills | Status: DC

## 2022-03-09 MED ORDER — OLANZAPINE 5 MG PO TBDP: 2.5000 mg | ORAL_TABLET | Freq: Every day | ORAL | Status: DC

## 2022-03-09 MED ORDER — INSULIN GLARGINE-YFGN 100 UNIT/ML ~~LOC~~ SOLN: 42.0000 [IU] | Freq: Every day | SUBCUTANEOUS | 11 refills | Status: DC

## 2022-03-09 MED ORDER — OSMOLITE 1.5 CAL PO LIQD: 1000.0000 mL | ORAL | 0 refills | Status: DC

## 2022-03-09 MED ORDER — SENNOSIDES-DOCUSATE SODIUM 8.6-50 MG PO TABS: 1.0000 | ORAL_TABLET | Freq: Two times a day (BID) | ORAL | Status: DC

## 2022-03-09 MED ORDER — POLYETHYLENE GLYCOL 3350 17 G PO PACK
17.0000 g | PACK | Freq: Every day | ORAL | 0 refills | Status: DC
Start: 2022-03-09 — End: 2022-03-20

## 2022-03-09 MED ORDER — ACETAMINOPHEN 325 MG PO TABS: 650.0000 mg | ORAL_TABLET | Freq: Four times a day (QID) | ORAL | Status: DC | PRN

## 2022-03-09 MED ORDER — CARVEDILOL 3.125 MG PO TABS: 3.1250 mg | ORAL_TABLET | Freq: Two times a day (BID) | ORAL | Status: DC

## 2022-03-09 MED ORDER — HEPARIN SODIUM (PORCINE) 5000 UNIT/ML IJ SOLN: 5000.0000 [IU] | Freq: Three times a day (TID) | INTRAMUSCULAR | Status: DC

## 2022-03-09 MED ORDER — INSULIN ASPART 100 UNIT/ML IJ SOLN: 0.0000 [IU] | INTRAMUSCULAR | 11 refills | Status: DC

## 2022-03-09 MED ORDER — CEPHALEXIN 500 MG PO CAPS: 500.0000 mg | ORAL_CAPSULE | Freq: Two times a day (BID) | ORAL | Status: DC

## 2022-03-09 MED ORDER — AMANTADINE HCL 50 MG/5ML PO SOLN: 100.0000 mg | Freq: Two times a day (BID) | ORAL | Status: DC

## 2022-03-09 MED ORDER — PANTOPRAZOLE SODIUM 40 MG PO PACK: 40.0000 mg | PACK | Freq: Every day | ORAL | Status: DC

## 2022-03-09 NOTE — Progress Notes (Signed)
Physical Therapy Session Note ? ?Patient Details  ?Name: Vernon [name] ?MRN: [id number] ?Date of Birth: May 18, 1946 ? ?Today's Date: 03/09/2022 ?PT Individual Time: 1005-1050 ?PT Individual Time Calculation (min): 45 min  ? ?Short Term Goals: ? ?Week 2:  PT Short Term Goal 1 (Week 2): Pt will perform bed mobility with consistent CGA. ?PT Short Term Goal 2 (Week 2): Pt will perform sit<>stand transfers with consistent MinA. ?PT Short Term Goal 3 (Week 2): Pt will perform stand pivot transfers with consistent ModA +1. ?PT Short Term Goal 4 (Week 2): Pt will ambulate 37ft using LRAD with consistent MinA. ? ? ?Skilled Therapeutic Interventions/Progress Updates:  ? ?Pt received sitting in recliner with neuropsych present. PT returned in 10 minutes and agreeable to PT. Ambulatory transfer to and from [facility] over toilet for urination and bowel movement with min-mod assist from PT with cues for obstacle navigation and step width on the LLE with turn to sit on BSC. PT performed clothing management and pericare for time management.  ? ?Pt transported to day room in [facility]. Stand pivot transfer to Nustep. Reciprocal movement training 2 x 4 min with therapeutic rest break between bouts. Level 2-4 with RPE 5-7/10 upon completion of each bout. Pt returned to room and performed stand pivot transfer to bed with UE supported on bed rail. Sit>supine completed without assist, and left supine in bed with call bell in reach and all needs met.  ? ?   ? ?Therapy Documentation ?Precautions:  ?Precautions ?Precautions: Fall ?Precaution Comments: [name], L weakness, L coordination deficit ?Restrictions ?Weight Bearing Restrictions: No ?General: ?PT Amount of Missed Time (min): 15 Minutes ?PT Missed Treatment Reason: Other (Comment) (neuropsych) ? ?Pain: ?Pain Assessment ?Pain Scale: 0-10 ?Pain Score: 0-No pain ? ? ? ? ?Therapy/Group: Individual Therapy ? ?Lorie Phenix ?03/09/2022, 10:53 AM  ?

## 2022-03-09 NOTE — Progress Notes (Signed)
[facility] [phone] [facility] ([facility])  ? ?Per MD/Anwar, patient is not appropriate for IPU. Dispo conversations continue to address if patient will need [facility] services in the home upon d/c. [facility] will continue to follow through dispo for updates/changes/decline.\ ? ?Please call with any questions/concerns.  ?  ?Thank you for the opportunity to participate in this patient's care ?  ?Daphene Calamity, MSW ?[facility] Liaison  ?[phone] ? ?

## 2022-03-09 NOTE — Discharge Summary (Signed)
Physician Discharge Summary  ?Patient ID: ?Vernon [name] ?MRN: [id number] ?DOB/AGE: 76-Dec-1947 76 y.o. ? ?Admit date: 02/26/2022 ?Discharge date: 03/20/2022 ? ?Discharge Diagnoses:  ?Principal Problem: ?  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 hemorrhage of brain ([place]) ?Active Problems: ?  Controlled type 2 diabetes mellitus with hyperglycemia, without long-term current use of insulin ([place]) ?  Stage 3b chronic kidney disease ([place]) ?  Dysphagia, post-stroke ?DVT prophylaxis ?Mood stabilization ?Decreased nutritional storage/dysphagia ?Seizure prophylaxis ?Right side pleural effusion ?Atrial fibrillation ?Diabetes mellitus ?Chronic anemia ?Hypertension ?E. coli UTI ? ?Discharged Condition: Stable ? ?Significant Diagnostic Studies: ?DG Chest 2 View ? ?Result Date: 02/20/2022 ?CLINICAL DATA:  Left cerebral intracranial hemorrhage. EXAM: CHEST - 2 VIEW COMPARISON:  02/20/2022, earlier today. FINDINGS: There is a enteric tube with tip coursing below the level of the hemidiaphragms. Stable cardiac enlargement. Aortic atherosclerosis. Lungs are hypoinflated but clear. No pleural effusion or edema. The visualized osseous structures are unremarkable. IMPRESSION: No active cardiopulmonary abnormalities. Electronically Signed   By: Kerby Moors M.D.   On: 02/20/2022 12:37  ? ?CT HEAD WO CONTRAST (5MM) ? ?Result Date: 02/20/2022 ?CLINICAL DATA:  Neuro deficit, acute, stroke suspected. EXAM: CT HEAD WITHOUT CONTRAST TECHNIQUE: Contiguous axial images were obtained from the base of the skull through the vertex without intravenous contrast. RADIATION DOSE REDUCTION: This exam was performed according to the departmental dose-optimization program which includes automated exposure control, adjustment of the mA and/or kV according to patient size and/or use of iterative reconstruction technique. COMPARISON:  Prior head CT examinations 02/18/2022 and earlier. Brain MRI 02/12/2022. FINDINGS: Brain: Continued interval evolution of postoperative changes from prior  left suboccipital craniectomy and left posterior fossa hematoma evacuation. Residual hemorrhage within the left cerebellar hemisphere, similar to slightly decreased from the prior head CT of 02/18/2022. Left cerebellar edema has not significantly changed. Stable mass effect with partial effacement of the fourth ventricle. Persistent edema and trace persistent hemorrhage within the right occipital lobe along the tract from prior attempted ventricular catheter placement. Mild patchy and ill-defined hypoattenuation within the cerebral white matter elsewhere, nonspecific but compatible with chronic small vessel ischemic disease. No interval acute intracranial hemorrhage. New acute demarcated cortical infarction. No hydrocephalus or supratentorial midline shift. Vascular: No hyperdense vessel. Atherosclerotic calcifications. Skull: Right occipital burr hole with overlying skin staples. Sinuses/Orbits: Visualized orbits show no acute finding. Minimal mucosal thickening and small mucous retention cyst within the left maxillary sinus. Minimal mucosal thickening versus fluid within the left sphenoid sinus. Minimal mucosal thickening within the bilateral ethmoid sinuses. IMPRESSION: No evidence of interval acute intracranial abnormality. Continued evolution of postoperative changes from prior left suboccipital craniectomy and left posterior fossa hematoma evacuation. Residual hemorrhage within the left cerebellar hemisphere, similar to slightly decreased from the prior head CT of 02/18/2022. Unchanged left cerebellar edema. Stable mass effect with partial effacement of the fourth ventricle. No evidence of hydrocephalus. Persistent edema and trace persistent hemorrhage within the right occipital lobe along the tract from prior attempted ventricular catheter placement. Stable chronic small vessel ischemic changes within the cerebral white matter. Paranasal sinus disease, as described. Electronically Signed   By: Kellie Simmering  D.O.   On: 02/20/2022 12:39  ? ?CT HEAD WO CONTRAST (5MM) ? ?Result Date: 02/19/2022 ?CLINICAL DATA:  Hemorrhagic stroke EXAM: CT HEAD WITHOUT CONTRAST TECHNIQUE: Contiguous axial images were obtained from the base of the skull through the vertex without intravenous contrast. RADIATION DOSE REDUCTION: This exam was performed according to the departmental dose-optimization program which includes automated exposure control, adjustment of the mA  and/or kV according to patient size and/or use of iterative reconstruction technique. COMPARISON:  02/15/2022. FINDINGS: Brain: Left superior cerebellar edema and hemorrhage, which are slightly decreased in size and conspicuity. Decreased mass effect on the fourth ventricle, which is more visible on this exam. Redemonstrated hemorrhage in the right occipital ventriculostomy tract. Redemonstrated hypodensity in the right parietal lobe, along the prior ventriculostomy tract, with a small amount of hemorrhage again noted, also slightly decreased in conspicuity. No hydrocephalus or acute extra-axial collection. No acute infarct, mass, or cerebral midline shift. Vascular: No hyperdense vessel. Skull: Left occipital craniectomy. Right parietal burr hole. No acute osseous abnormality. Sinuses/Orbits: Minimal mucosal thickening in the left maxillary sinus. Otherwise negative. The orbits are unremarkable. Other: The mastoids are well aerated. IMPRESSION: 1. Decreased size and conspicuity of the left superior cerebellar hemorrhage with associated edema, with slightly decreased mass effect on the fourth ventricle. 2. Redemonstrated hemorrhage in the right occipital ventriculostomy tract. 3. Slightly decreased density of hemorrhage in the right parietal ventriculostomy tract, consistent with evolution of blood products. Electronically Signed   By: Merilyn Baba M.D.   On: 02/19/2022 00:11  ? ?US RENAL ? ?Result Date: 03/16/2022 ?CLINICAL DATA:  AKI. History of hypertension and diabetes.  EXAM: RENAL / URINARY TRACT ULTRASOUND COMPLETE COMPARISON:  Abdominal ultrasound 01/19/2020 FINDINGS: Right Kidney: Renal measurements: 11.4 x 6.1 x 6.9 cm = volume: 272 mL. Diffuse mild cortical thinning. Slight increased echogenicity. No solid mass or hydronephrosis visualized. There are 2 anechoic circumscribed cystic masses, the larger measuring 13.7 cm at the middle to lower third of the kidney with a single minimally thickened smooth septations. This cystic mass is mildly increased in size compared to 01/19/2020. No new solid component or thickening of the single septation. An additional 4.1 cm simple cyst is seen at the upper third of the kidney not significantly changed compared to 01/19/2020. Left Kidney: Renal measurements: 11.9 x 6.4 x 6.1 cm = volume: 242 mL. Diffuse mild cortical thinning. Slight increased echogenicity. No solid mass or hydronephrosis visualized. Several anechoic circumscribed cystic cortical masses measuring up to 7.1 cm at the lower pole. Bladder: Appears normal for degree of bladder distention. Other: Visualized portions of the spleen are unremarkable. IMPRESSION: 1. Mildly atrophic kidneys consistent with changes of medical renal disease. No acute abnormality of the kidneys. 2. A minimally complex cyst at the right kidney is mildly increased in size compared to 01/19/2020 without interval development of abnormal calcification or solid component. Recommend follow-up in 1 year. 3. Other benign simple cortical renal cysts are noted bilaterally. 4. Normal appearance of the urinary bladder. Electronically Signed   By: Ileana Roup M.D.   On: 03/16/2022 17:12  ? ?DG CHEST PORT 1 VIEW ? ?Result Date: 02/20/2022 ?CLINICAL DATA:  Encounter for Abnormal CXR EXAM: PORTABLE CHEST 1 VIEW COMPARISON:  March 24, 23. FINDINGS: Previously-seen opacities have resolved. No visible pleural effusions or pneumothorax. Enteric tube courses below the diaphragm with the tip outside the field of view.  Similar enlargement of the cardiac silhouette. IMPRESSION: 1. Previously-seen opacities have resolved. No evidence of acute cardiopulmonary abnormality. 2. Similar cardiomegaly. Electronically Signed   By: Richardean Sale

## 2022-03-09 NOTE — Progress Notes (Signed)
Patient ID: Vernon [name], male   DOB: Jun 16, 1946, 76 y.o.   MRN: [id number] ? ?[facility] resources provided to patient spouse ?

## 2022-03-09 NOTE — Progress Notes (Signed)
Inpatient Rehabilitation Discharge Medication Review by a Pharmacist ? ?A complete drug regimen review was completed for this patient to identify any potential clinically significant medication issues. ? ?High Risk Drug Classes Is patient taking? Indication by Medication  ?Antipsychotic Yes Zyprexa- agitation  ?Anticoagulant Yes Heparin- VTE prophylaxis  ?Antibiotic Yes Keflex- UTI  ?Opioid No   ?Antiplatelet No   ?Hypoglycemics/insulin Yes iSS, Semglee- T2DM  ?Vasoactive Medication Yes Coreg- hypertension, rate control  ?Chemotherapy No   ?Other Yes Protonix- GERD  ? ? ? ?Type of Medication Issue Identified Description of Issue Recommendation(s)  ?Drug Interaction(s) (clinically significant) ?    ?Duplicate Therapy ?    ?Allergy ?    ?No Medication Administration End Date ?    ?Incorrect Dose ?    ?Additional Drug Therapy Needed ?    ?Significant med changes from prior encounter (inform family/care partners about these prior to discharge).    ?Other ? Apixaban Pursuing hospice care with no aggressive treatments. Apixaban held by neurology. Continue heparin SQ upon transfer.   ? ? ?Clinically significant medication issues were identified that warrant physician communication and completion of prescribed/recommended actions by midnight of the next day:  No ? ? ?Time spent performing this drug regimen review (minutes):  30 ? ?Sione Baumgarten BS, PharmD, BCPS ?Clinical Pharmacist ?03/09/2022 9:51 AM ? ?Contact: [phone] after 3 PM ? ?"Be curious, not judgmental..." -Jamal Maes ?

## 2022-03-09 NOTE — Progress Notes (Signed)
Palliative: ? 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  He required intubation for airway protection and underwent emergent craniotomy on 3/18 with hematoma evacuation and drain placement.   Palliative care has been asked to get involved to further discuss goals of care in the setting of ICH and ongoing/worsening encephalopathy.    ? ?Went to the bedside early this morning to assess Mr. Grobe and was joined by Dr. Rowe Pavy, palliative medicine MD.  During our interaction with Mr. Berrett this morning he was able to articulate his hospital course including minor details.  He explained that he had had a stroke and explained how this relates to his anticoagulation.  He understood that he was receiving nutrition through the tube in his nose and expressed his hope that he will be able to eat again.  He also expressed awareness that there were some details that he could not remember due to the severity of his illness. Patient's cognition was significantly improved from my interaction with him yesterday.  He seemed to be much more aware of the situation.  In discussion with Dr. Rowe Pavy it was decided that patient no longer qualifies for inpatient hospice unit. ? ?Call made to patient's wife [name] and I shared with her improvement in patient's cognition and overall status.  Shared with her that patient no longer seems appropriate to move to a hospice facility.  She would like to have further discussion so we plan to meet in person when she arrives at the hospital. ? ?Met later in the room with patient, his wife, and nurse at bedside.  During this interaction patient is consuming ice chips with assistance from nurse and seems to be doing quite well.  No difficulty noted.  During our interaction patient continues to talk about his clinical course and still seems to have  good understanding of situation.  During my visit patient's pastor came to visit and patient immediately recognized pastor and initiated appropriate banter with him. ? ?Met outside patient's room with patient's wife.  Now that she has seen patient this morning she does agree that patient seems much better than previous days, she says today seems to be the best day he has had so far.  She also notes that her fear that this may just be 1 day and more bad days will follow.  We discussed the difficulty sitting in the uncertainty of how Mr. Eagon will do moving forward. ? ?We discussed plan for neuropsych evaluation and how this will provide information on patient's ability to participate in medical decision-making.  We also reviewed plan for further swallowing evaluation, likely including a modified barium swallow.  We discussed a lot of Mr. Whilden course moving forward depends on his swallowing.  We discussed that if he does not do well with a swallowing evaluation we will need to include him in further discussions about how to approach his care moving forward. ? ?Patient's wife shares her concerns about caring for Mr. Byrom at home.  She is worried he will have another stroke.  She is also worried that she will be overwhelmed with the care that he needs.  We discussed different types of potential support at home.  Emotional support provided throughout. ? ?Situation discussed with rehab team.  [facility] team to provide resources for home caregivers to patient's wife. ? ?Exam: Awake, alert, oriented. Speech clear.  No distress. Respirations regular.  [name] remains in place with tube feed infusing. ? ?Plan: ?No longer appropriate for hospice inpatient unit ?Pending neuropsych evaluation for patient's capacity to participate in decision-making ?Continue rehab ?PMT will follow along, will check in with wife tomorrow ? ? ?Juel Burrow, DNP, AGNP-C ?Palliative Medicine Team ?Team Phone # [phone]  ?Pager #  [phone] ? ? ?

## 2022-03-09 NOTE — Progress Notes (Signed)
Occupational Therapy Session Note ? ?Patient Details  ?Name: Vernon [name] ?MRN: [id number] ?Date of Birth: [birth year] ? ?Today's Date: 03/09/2022 ?OT Individual Time: 3419-6222 ?OT Individual Time Calculation (min): 56 min  ? ? ?Short Term Goals: ?Week 1:  OT Short Term Goal 1 (Week 1): Pt will bathe UB seated at sink with min A. ?OT Short Term Goal 1 - Progress (Week 1): Met ?OT Short Term Goal 2 (Week 1): Pt will complete > 2 grooming task at sink with S. ?OT Short Term Goal 2 - Progress (Week 1): Not met ?OT Short Term Goal 3 (Week 1): Pt will complete sit to stand with min A in prep for standing ADL. ?OT Short Term Goal 3 - Progress (Week 1): Not met ?OT Short Term Goal 4 (Week 1): Pt will complete toilet transfer with max A and LRAD. ?OT Short Term Goal 4 - Progress (Week 1): Not met ?Week 2:  OT Short Term Goal 1 (Week 2): Pt will complete toilet transfer with max A and LRAD. ?OT Short Term Goal 2 (Week 2): Pt will complete sit to stand with min A in prep for standing ADL. ?OT Short Term Goal 3 (Week 2): Pt will complete > 2 grooming task at sink with S. ?OT Short Term Goal 4 (Week 2): Pt will utilize external aids to with no more than mod cuing to orient self to location. ? ?Skilled Therapeutic Interventions/Progress Updates:  ?  Pt received side-lying in bed asleep but easily awakened to voice, agreeable to therapy. Session focus on self-care retraining, activity tolerance, transfer retraining in prep for improved ADL/IADL/func mobility performance + decreased caregiver burden. ? ?Came to sitting EOB with S. Denies pain but reports ongoing nausea, episode of dry heaving but no emesis. LPN present and aware, reports concern for consuming ice chips earlier. Pt reports nausea subsided after dry heaving episode. Pt completed UBD with distant S, LBD with mod A to pull pants over B hips. Initial sit to stand with min to mod A due to strong posterior bias and requires cues to recall split hand technique. Short  ambulatory transfer with heavier min A and cues for upright posture and managing RW. ? ?Total A w/c transport to and from outdoor area for change in scenery/to support psychoemotional health and pt appreciative. 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 Pt stated he's looking forward to "getting better" and "going home." ? ?Pt left in recliner with safety belt alarm engaged, call bell in reach, and all immediate needs met.  ? ? ?Therapy Documentation ?Precautions:  ?Precautions ?Precautions: Fall ?Precaution Comments: [name], L weakness, L coordination deficit ?Restrictions ?Weight Bearing Restrictions: No ? ?Pain: no c/o throughout ?  ?ADL: See Care Tool for more details. ? ?Therapy/Group: Individual Therapy ? ?Volanda Napoleon MS, OTR/L ? ?03/09/2022, 6:55 AM ?

## 2022-03-09 NOTE — Progress Notes (Signed)
?                                                       PROGRESS NOTE ? ? ?Subjective/Complaints: ? ?Pt without c/os, pleasant and cooperative  ?Oriented to person , place, Month, but not day of week.  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 ?ROS- denies CP, SOB , N/V/D ? ? ?Objective: ?  ?No results found. ?Recent Labs  ?  03/07/22 ?0959  ?WBC 7.8  ?HGB 7.9*  ?HCT 25.5*  ?PLT 235  ? ? ? ?No results for input(s): NA, K, CL, CO2, GLUCOSE, BUN, CREATININE, CALCIUM in the last 72 hours. ? ? ?Intake/Output Summary (Last 24 hours) at 03/09/2022 0950 ?Last data filed at 03/09/2022 3007 ?Gross per 24 hour  ?Intake 0 ml  ?Output 300 ml  ?Net -300 ml  ? ?  ? ?  ? ?Physical Exam: ?Vital Signs ?Blood pressure 115/76, pulse 67, temperature 98.3 ?F (36.8 ?C), temperature source Oral, resp. rate 16, height '5\' 9"'$  (1.753 m), weight 85.1 kg, SpO2 99 %. ? ? ?General: No acute distress ?Mood and affect are appropriate ?Heart: Regular rate and rhythm no rubs murmurs or extra sounds ?Lungs: Clear to auscultation, breathing unlabored, no rales or wheezes ?Abdomen: Positive bowel sounds, soft nontender to palpation, nondistended ?Extremities: No clubbing, cyanosis, or edema ?Skin: No evidence of breakdown, no evidence of rash ? ?Skin: No evidence of breakdown, no evidence of rash ?Neurologic: Cranial nerves II through XII intact, motor strength is 5/5 in bilateral deltoid, bicep, tricep, grip, hip flexor, knee extensors, ankle dorsiflexor and plantar flexor ?Sensory exam normal sensation to light touch and proprioception in bilateral upper and lower extremities ?Cerebellar exam LUE ataxia ?Musculoskeletal: Full range of motion in all 4 extremities. No joint swelling ?Oriented to person , place and partially to time  ? ?Assessment/Plan: ?1. Functional deficits which require 3+ hours per day of  interdisciplinary therapy in a comprehensive inpatient rehab setting. ?Physiatrist is providing close team supervision and 24 hour management of active medical problems listed below. ?Physiatrist and rehab team continue to assess barriers to discharge/monitor patient progress toward functional and medical goals ? ?Care Tool: ? ?Bathing ? Bathing activity did not occur: Safety/medical concerns ?Body parts bathed by patient: Right arm, Left arm, Chest, Abdomen, Face  ? Body parts bathed by helper: Left lower leg, Right lower leg, Buttocks ?  ?  ?Bathing assist Assist Level: Supervision/Verbal cueing ?  ?  ?Upper Body Dressing/Undressing ?Upper body dressing Upper body dressing/undressing activity did not occur (including orthotics): Safety/medical concerns ?What is the patient wearing?: Pull over shirt ?   ?Upper body assist Assist Level: Supervision/Verbal cueing ?   ?Lower Body Dressing/Undressing ?Lower body dressing ? ? ? Lower body dressing activity did not occur: Safety/medical concerns ?What is the patient wearing?: Pants, Incontinence brief ? ?  ? ?Lower body assist Assist for lower body dressing: Moderate Assistance - Patient 50 - 74% ?   ? ?Toileting ?Toileting    ?Toileting assist Assist for toileting: Maximal Assistance - Patient 25 - 49% ?  ?  ?Transfers ?Chair/bed transfer ? ?Transfers assist ? Chair/bed transfer activity did  not occur: Safety/medical concerns ? ?Chair/bed transfer assist level: Minimal Assistance - Patient > 75% ?  ?  ?Locomotion ?Ambulation ? ? ?Ambulation assist ? ? Ambulation activity did not occur: Safety/medical concerns ? ?  ?  ?   ? ?Walk 10 feet activity ? ? ?Assist ? Walk 10 feet activity did not occur: Safety/medical concerns ? ?  ?   ? ?Walk 50 feet activity ? ? ?Assist Walk 50 feet with 2 turns activity did not occur: Safety/medical concerns ? ?  ?   ? ? ?Walk 150 feet activity ? ? ?Assist Walk 150 feet activity did not occur: Safety/medical concerns ? ?  ?  ?  ? ?Walk 10  feet on uneven surface  ?activity ? ? ?Assist Walk 10 feet on uneven surfaces activity did not occur: Safety/medical concerns ? ? ?  ?   ? ?Wheelchair ? ? ? ? ?Assist Is the patient using a wheelchair?: Yes ?Type of Wheelchair: Manual ?Wheelchair activity did not occur: Safety/medical concerns ? ?  ?   ? ? ?Wheelchair 50 feet with 2 turns activity ? ? ? ?Assist ? ?  ?Wheelchair 50 feet with 2 turns activity did not occur: Safety/medical concerns ? ? ?   ? ?Wheelchair 150 feet activity  ? ? ? ?Assist ? Wheelchair 150 feet activity did not occur: Safety/medical concerns ? ? ?   ? ?Blood pressure 115/76, pulse 67, temperature 98.3 ?F (36.8 ?C), temperature source Oral, resp. rate 16, height '5\' 9"'$  (1.753 m), weight 85.1 kg, SpO2 99 %. ? ?Medical Problem List and Plan: ?1. 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            -patient may shower ?            -ELOS/Goals: 03/20/22  supervision to min assist goals with PT, OT and supervision with SLP- progressing toward goals ?  ? ?2.  Antithrombotics: ?-DVT/anticoagulation:  Pharmaceutical: Heparin initiated 02/12/2022 ?            -antiplatelet therapy: N/A ?3. Pain Management: Tylenol as needed ?4. Decreased arousal:  ?-continue Amantadine 100 mg twice daily for arousal ?            -adjust timing  ?-check sleep chart ?-consider adjustment to shunt settings if no further improvement? ?-urine culture never done. Will recheck on admit ?            -antipsychotic agents: Zyprexa via 2.5 mg twice daily which seems to have helped with agitation- may start to wean once pt stops trying to get OOB at noc, still impulsive in PT ?5. Neuropsych: This patient is capable of making medical decisions on his own behalf. ?Will ask Neuropsych to help delineate his cognitive issues and capacity ?SLP has  noted improvement in cognition this week as well. ?6. Skin/Wound Care: Routine skin checks ?7. Fluids/Electrolytes/Nutrition: Routine in and outs with follow-up chemistries ?8.  Postop large right-sided pleural effusion.  Status postthoracentesis with 1250 cc yield.  Latest chest x-ray unremarkable ?9.  Seizure prophylaxis.  Keppra 500 mg twice daily completed ?10.  Atrial fibrillation.  Eliquis currently on hold.  Neurology considering resuming in approximately 4 weeks.  Cardiac rate controlled ?11.  Diabetes mellitus.  Hemoglobin A1c 6.2.  NovoLog 2 units every 4 hours, Semglee 30 units daily ?CBG (last 3)  ?Recent Labs  ?  03/09/22 ?  0011 03/09/22 ?0422 03/09/22 ?0915  ?GLUCAP 136* 156* 201*  ? ?Increase Semglee to 42U- controlled 03/08/22, if NG tube stop will need to reduce Semglee to 10U  ? ?12.  Chronic anemia.  Follow-up CBC ?13  Hypertension.  Continue Coreg 3.125 mg twice daily.  Monitor with increased mobility ?          ?Vitals:  ? 03/08/22 1918 03/09/22 0416  ?BP: 116/66 115/76  ?Pulse: 62 67  ?Resp: 16 16  ?Temp: 98.4 ?F (36.9 ?C) 98.3 ?F (36.8 ?C)  ?SpO2: 94% 99%  ?Controlled 03/09/22 ?14.  Dysphagia.  Currently NPO.  Nasogastric tube feeds.  Dietary and speech therapy follow-up ?            Repeat MBS per SLP, pt can make decision whether he wishes to proceed ?15. UTI with <100,000 E Coli. Keflex started. Sens to Keflex finish 7-day treatment ? 16.  Anemia with normal plt and WBCs, counts are stable  ? ?  Latest Ref Rng & Units 03/07/2022  ?  9:59 AM 02/27/2022  ?  5:20 AM 02/26/2022  ?  2:36 AM  ?CBC  ?WBC 4.0 - 10.5 K/uL 7.8   10.5   11.7    ?Hemoglobin 13.0 - 17.0 g/dL 7.9   8.0   8.7    ?Hematocrit 39.0 - 52.0 % 25.5   24.7   26.7    ?Platelets 150 - 400 K/uL 235   342   359    ?  ? ?LOS: ?11 days ?A FACE TO FACE EVALUATION WAS PERFORMED ? ?Luanna Salk Mirielle Byrum ?03/09/2022, 9:50 AM  ? ? ? ?

## 2022-03-09 NOTE — Progress Notes (Signed)
Speech Language Pathology Daily Session Note ? ?Patient Details  ?Name: Vernon [name] ?MRN: [id number] ?Date of Birth: 10/27/1946 ? ?Today's Date: 03/09/2022 ?SLP Individual Time: 9924-2683 ?SLP Individual Time Calculation (min): 15 min ? ?Short Term Goals: ?Week 2: SLP Short Term Goal 1 (Week 2): Pt will participate instrumental swallow assessment to determine appropriateness for PO diet ?SLP Short Term Goal 2 (Week 2): Pt will increase orientation to biographical info, place, time and recent events provided min A multimodal cues ?SLP Short Term Goal 3 (Week 2): Pt will increase speech intelligibility at the conversation level to >90% with sup A verbal cues ?SLP Short Term Goal 4 (Week 2): Pt will complete basic problem solving with min A cues ?SLP Short Term Goal 5 (Week 2): Pt will increase sustained attention to functional tasks to 15 minutes provided min A cues ?SLP Short Term Goal 6 (Week 2): Pt will follow 2-step directions during functional tasks with 80% accuracy with min cues ? ?Skilled Therapeutic Interventions: Patient received awake/alert in wheelchair on arrival. SLP facilitated conversation regarding patient's wishes on SLP plan of care. At this time, patient remains NPO with water protocol; ice chips only. Discussed recommendations to move forward with instrumental swallow assessment to further evaluate oropharyngeal swallow function to determine candidacy for initiating PO diet. Swallow study originally scheduled for yesterday 4/13 was cancelled per spouse wishes. Today, patient verbalized preference to proceed with modified barium swallow (MBS) study and expressed eagerness to consume food/drink again. SLP requested to call spouse to include in decision making. Pt dialed spouse's number following initial cue to "dial 9" first. Attempted x2 in room but no answer; eventually able to reach spouse during third attempt who verbalized agreement with plan to proceed with MBS. SLP communicated that the  earliest opportunity to complete this study would be on Monday, 4/17 based on radiology scheduling and SLP staffing. Both pt and spouse verbalized understanding and agreement with plan. Proceed with SLP plan of care and MBS on 4/17 @ 0900. Pt was left in wheelchair with alarm activated and immediate needs met. ?   ?Pain ?Pain Assessment ?Pain Scale: 0-10 ?Pain Score: 0-No pain ? ?Therapy/Group: Individual Therapy ? ?Velmer Broadfoot T Alfred Eckley ?03/09/2022, 4:37 PM ?

## 2022-03-09 NOTE — Consult Note (Signed)
Neuropsychological Consultation ? ? ?Patient:   Vernon [name]  ? ?DOB:   02-26-1946 ? ?MR Number:  [id number] ? ?Location:  [place] [address] ?[id number] [facility] ?[place] [address] ?Dept: [phone] ?Loc: [phone] ?          ?Date of Service:   03/09/2022 ? ?Start Time:   10 AM ?End Time:   11 AM ? ?Provider/Observer:  Ilean Skill, Psy.D.   ?    Clinical Neuropsychologist ?     ? ?Billing Code/Service: [id number] ? ?Chief Complaint:    Vernon [name] is a 76 year old male with a history of chronic anemia, diabetes, hyperlipidemia, hypertension, A-fib.  Patient had been independent and driving prior to admission.  Patient is retired.  Patient presented on 02/10/2022 with left-sided weakness and incoordination as well as nausea/vomiting.  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  Eliquis was reversed.  Patient had been on long-term anticoagulation therapy due to cardiovascular issues.  Patient underwent suboccipital craniotomy and evacuation of cerebellar hemorrhage.  Patient has continued to remain n.p.o. with nutritional tube feeds.  The patient with decreased functional mobility particularly left-sided weakness and was admitted to CIR.  However, consultation with palliative care had been initiated by the patient's wife and initially was thought to be a candidate for palliative care.  However, that potential referral to palliative care/hospice has come into question as the patient has continued to show progressive improvements. ? ?Reason for Service:  Patient was referred for neuropsychological consultation to assess current level of cognition and evaluate progress and provide insight as to patient's competency to facilitate an decision making etc.  Below is the HPI for the current admission. ? ?HPI: Vernon [name] is a  76 year old right-handed male with history of chronic anemia, diabetes mellitus, hyperlipidemia, hypertension, atrial fibrillation maintained on Eliquis.  Per chart review patient lives with spouse.  Able to stay on main level with bed and bath.  Independent driving prior to admission and retired.  Wife works full-time.  Presented 02/10/2022 with left-sided weakness and incoordination as well as nausea vomiting.  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  Surrounding edema with mild effacement of the fourth ventricle.   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  Mild atherosclerosis without greater than 50% stenosis.  Eliquis was reversed with [name].  Patient underwent suboccipital craniectomy and evacuation of cerebellar hemorrhage.  Placement of right parieto-occipital ventriculostomy 02/11/2022 per Dr. Ellene Route.  Maintained on Keppra 14 days postoperatively ending 02/24/2022.  Follow-up CTs/MRI no hydrocephalus.  EVD was pulled 3/22.  Patient did require short-term intubation through 02/14/2022.  Postoperative course finding the left-sided pleural effusion and moderate to large right side pleural effusion and underwent thoracentesis with 1250 cc yield.  Latest chest x-ray showing no active disease.  Patient currently remains n.p.o. with nutritional tube feeds.  He was cleared to begin subcutaneous heparin for DVT prophylaxis 02/12/2022.  Neurology notes considering resuming AC at 4 weeks.  Bouts of restlessness and agitation with Seroquel added changed to Zyprexa via as well as the addition of amantadine with latest follow-up CT scan of the head 02/20/2022 showed no evidence of acute intracranial abnormality..  Palliative care therapy evaluations completed due to patient decreased functional mobility left  side weakness was admitted for a comprehensive rehab  program. ? ?Current Status:  Patient was awake and alert sitting in his recliner chair as I entered the room.  He appropriately greeted me and offered handshake and appropriate initial banter.  Patient was oriented to person, place and situation.  He was also oriented to year month and season but not specific date.  Patient was able to verbalize his understanding of what had happened with accurate description and a general explanation.  Patient knew that he was in the hospital in [facility].  He did not name [facility] specifically.  Patient with appropriate mood response including displaying appropriate levels of distress regarding loss of function but also displaying a range of other emotional responses during various topics discussed.  Patient appeared to be able to adequately describe his wife's current status of functioning as well as his prior occupation and work.  Patient was also able to describe various activities that he engaged in during therapies over the past several days.  While I knew the patient was n.p.o. with nasogastric tube in place I asked the patient what he had had for breakfast this morning.  The patient quickly and with somewhat of a joking response stated that he was not eating at this time and had the tube in his nose.  Patient was able to describe why this was in place and his hope that he would be able to eat food again in the near future. ? ?While formal standardized testing was not conducted it was abundantly clear that the patient is showing significant improvements from a cognitive standpoint.  He was oriented with good mental status although visual-spatial and visual constructional assessment was not done.  The patient is aware of his current status.  He clearly appears to be competent to participate in decision-making.  He is aware that he will likely go to from our inpatient unit to a skilled nursing facility and understands that his care at least at the time of  discharge from the inpatient unit may be more than his wife cannot handle on her own.  However, the patient is making progress cognitively as well as physically and is now working on stand pivot transfer with support and increasing motor strength and function.  Patient does not appear to be a candidate for palliative care/hospice interventions at this time. ? ?Behavioral Observation: STEAVEN WHOLEY  presents as a 76 y.o.-year-old Right handed Caucasian Male who appeared his stated age. his dress was Appropriate and he was Well Groomed and his manners were Appropriate to the situation.  his participation was indicative of Appropriate and Attentive behaviors.  There were physical disabilities noted.  he displayed an appropriate level of cooperation and motivation.   ? ? ?Interactions:    Active Appropriate ? ?Attention:   abnormal and attention span appeared shorter than expected for age ? ?Memory:   within normal limits; with the exception of the time immediately before and after his cerebrovascular accident.  Patient is learning new information now and while it might not be the complete depth of information that he had been retaining prior to his CVA this is progressively improving. ? ?Visuo-spatial:  not examined although there are clearly potential visual spatial deficits given the location of his CVA. ? ?Speech (Volume):  normal ? ?Speech:   normal; normal ? ?Thought Process:  Coherent and Relevant ? ?Though Content:  WNL; not suicidal and not homicidal ? ?Orientation:   person, place, time/date, and  situation although the depth and breath of his understanding of the situation is impaired to some degree. ? ?Judgment:   Fair ? ?Planning:   Fair ? ?Affect:    Appropriate ? ?Mood:    Dysphoric ? ?Insight:   Fair ? ?Intelligence:   normal ? ? ? ?Medical History:   ?Past Medical History:  ?Diagnosis Date  ? Anemia   ? Aortic stenosis   ? Arthritis   ? Complication of anesthesia   ? hard time getting bp up after knee  replacement  ? Diabetes mellitus without complication ([place])   ? GERD (gastroesophageal reflux disease)   ? occ tums prn  ? History of hiatal hernia   ? Hypertension   ? 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[place])   ? ?

## 2022-03-10 DIAGNOSIS — I69391 Dysphagia following cerebral infarction: Secondary | ICD-10-CM

## 2022-03-10 DIAGNOSIS — N1832 Chronic kidney disease, stage 3b: Secondary | ICD-10-CM

## 2022-03-10 DIAGNOSIS — E1165 Type 2 diabetes mellitus with hyperglycemia: Secondary | ICD-10-CM

## 2022-03-10 LAB — GLUCOSE, CAPILLARY
Glucose-Capillary: 111 mg/dL — ABNORMAL HIGH (ref 70–99)
Glucose-Capillary: 154 mg/dL — ABNORMAL HIGH (ref 70–99)
Glucose-Capillary: 159 mg/dL — ABNORMAL HIGH (ref 70–99)
Glucose-Capillary: 171 mg/dL — ABNORMAL HIGH (ref 70–99)
Glucose-Capillary: 179 mg/dL — ABNORMAL HIGH (ref 70–99)

## 2022-03-10 NOTE — Progress Notes (Signed)
?                                                       PROGRESS NOTE ? ? ?Subjective/Complaints: ?Patient seen sitting up at the edge of his bed working with therapies this morning.  Good sitting balance noted.  He states he slept well overnight.  Speech is improving. ? ?ROS- denies CP, SOB , N/V/D ? ? ?Objective: ?  ?No results found. ?No results for input(s): WBC, HGB, HCT, PLT in the last 72 hours. ? ? ?No results for input(s): NA, K, CL, CO2, GLUCOSE, BUN, CREATININE, CALCIUM in the last 72 hours. ? ? ?Intake/Output Summary (Last 24 hours) at 03/10/2022 1153 ?Last data filed at 03/10/2022 1324 ?Gross per 24 hour  ?Intake --  ?Output 1450 ml  ?Net -1450 ml  ? ?  ? ?  ? ?Physical Exam: ?Vital Signs ?Blood pressure 119/82, pulse 63, temperature 98.5 ?F (36.9 ?C), temperature source Oral, resp. rate 18, height '5\' 9"'$  (1.753 m), weight 85.1 kg, SpO2 95 %. ? ? ?General: No acute distress.  + NG. ?Mood and affect are appropriate ?Heart: Regular rate and rhythm no rubs murmurs or extra sounds ?Lungs: Clear to auscultation, breathing unlabored, no rales or wheezes ?Abdomen: Positive bowel sounds, soft nontender to palpation, nondistended ?Extremities: No clubbing, cyanosis, or edema ?Skin: No evidence of breakdown, no evidence of rash ?Neurologic: Alert ?Motor: 5/5 in bilateral deltoid, bicep, tricep, grip, hip flexor, knee extensors, ankle dorsiflexor and plantar flexor ?Cerebellar exam LUE ataxia, unchanged ?Musculoskeletal: Full range of motion in all 4 extremities. No joint swelling ? ?Assessment/Plan: ?1. Functional deficits which require 3+ hours per day of interdisciplinary therapy in a comprehensive inpatient rehab setting. ?Physiatrist is providing close team supervision and 24 hour management of active medical problems listed below. ?Physiatrist and rehab team continue to assess barriers to discharge/monitor patient progress toward functional and medical goals ? ?Care Tool: ? ?Bathing ? Bathing activity did not  occur: Safety/medical concerns ?Body parts bathed by patient: Right arm, Left arm, Chest, Abdomen, Face  ? Body parts bathed by helper: Left lower leg, Right lower leg, Buttocks ?  ?  ?Bathing assist Assist Level: Supervision/Verbal cueing ?  ?  ?Upper Body Dressing/Undressing ?Upper body dressing Upper body dressing/undressing activity did not occur (including orthotics): Safety/medical concerns ?What is the patient wearing?: Pull over shirt ?   ?Upper body assist Assist Level: Supervision/Verbal cueing ?   ?Lower Body Dressing/Undressing ?Lower body dressing ? ? ? Lower body dressing activity did not occur: Safety/medical concerns ?What is the patient wearing?: Pants, Incontinence brief ? ?  ? ?Lower body assist Assist for lower body dressing: Moderate Assistance - Patient 50 - 74% ?   ? ?Toileting ?Toileting    ?Toileting assist Assist for toileting: Maximal Assistance - Patient 25 - 49% ?  ?  ?Transfers ?Chair/bed transfer ? ?Transfers assist ? Chair/bed transfer activity did not occur: Safety/medical concerns ? ?Chair/bed transfer assist level: Minimal Assistance - Patient > 75% ?  ?  ?Locomotion ?Ambulation ? ? ?Ambulation assist ? ? Ambulation activity did not occur: Safety/medical concerns ? ?  ?  ?   ? ?Walk 10 feet activity ? ? ?Assist ? Walk 10 feet activity did not occur: Safety/medical concerns ? ?  ?   ? ?Walk 50 feet activity ? ? ?  Assist Walk 50 feet with 2 turns activity did not occur: Safety/medical concerns ? ?  ?   ? ? ?Walk 150 feet activity ? ? ?Assist Walk 150 feet activity did not occur: Safety/medical concerns ? ?  ?  ?  ? ?Walk 10 feet on uneven surface  ?activity ? ? ?Assist Walk 10 feet on uneven surfaces activity did not occur: Safety/medical concerns ? ? ?  ?   ? ?Wheelchair ? ? ? ? ?Assist Is the patient using a wheelchair?: Yes ?Type of Wheelchair: Manual ?Wheelchair activity did not occur: Safety/medical concerns ? ?  ?   ? ? ?Wheelchair 50 feet with 2 turns activity ? ? ? ?Assist ? ?   ?Wheelchair 50 feet with 2 turns activity did not occur: Safety/medical concerns ? ? ?   ? ?Wheelchair 150 feet activity  ? ? ? ?Assist ? Wheelchair 150 feet activity did not occur: Safety/medical concerns ? ? ?   ? ?Blood pressure 119/82, pulse 63, temperature 98.5 ?F (36.9 ?C), temperature source Oral, resp. rate 18, height '5\' 9"'$  (1.753 m), weight 85.1 kg, SpO2 95 %. ? ?Medical Problem List and Plan: ?1. 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 Continue CIR ?2.  Antithrombotics: ?-DVT/anticoagulation:  Pharmaceutical: Heparin initiated 02/12/2022 ?            -antiplatelet therapy: N/A ?3. Pain Management: Tylenol as needed ?4. Decreased arousal:  ?-continue Amantadine 100 mg twice daily for arousal ?            -adjust timing  ?-check sleep chart ?-consider adjustment to shunt settings if no further improvement? ?-urine culture never done. Will recheck on admit ?            -antipsychotic agents: Zyprexa via 2.5 mg twice daily which seems to have helped with agitation- may start to wean once pt stops trying to get OOB at noc, still impulsive in PT ? Appears to be improving ?5. Neuropsych: This patient is capable of making medical decisions on his own behalf. ?Will ask Neuropsych to help delineate his cognitive issues and capacity ?SLP has noted improvement in cognition as well ? TeleSitter for safety ?6. Skin/Wound Care: Routine skin checks ?7. Fluids/Electrolytes/Nutrition: Routine in and outs ?8.  Postop large right-sided pleural effusion.  Status postthoracentesis with 1250 cc yield.  Latest chest x-ray unremarkable ?9.  Seizure prophylaxis.  Keppra 500 mg twice daily completed ?10.  Atrial fibrillation.  Eliquis currently on hold.  Neurology considering resuming in approximately 4 weeks.  Cardiac rate controlled ?11.   Diabetes mellitus with hyperglycemia.  Hemoglobin A1c 6.2.  NovoLog 2 units every 4 hours, Semglee 30 units daily ?CBG (last 3)  ?Recent Labs  ?  03/10/22 ?5701 03/10/22 ?7793 03/10/22 ?1148  ?GLUCAP 154* 159* 111*  ? ?Increase Semglee to 42U- controlled 03/08/22, if NG tube stop will need to reduce Semglee to 10U  ? Elevated, but improving on 4/15 ?12.  Chronic anemia.   ? Hemoglobin 7.9 on 4/12, relatively stable, labs ordered for Monday ?13  Hypertension.  Continue Coreg 3.125 mg twice daily.  Monitor with increased mobility ?          ?Vitals:  ? 03/09/22 1939 03/10/22 0355  ?BP: 115/79 119/82  ?Pulse: 64 63  ?Resp: 18 18  ?Temp: 98 ?F (36.7 ?C) 98.5 ?F (36.9 ?C)  ?SpO2: 99% 95%  ?  Controlled on 4/15 ?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  Currently NPO.  Nasogastric tube feeds.  Dietary and speech therapy follow-up ?            Repeat MBS per SLP, pt can make decision whether he wishes to proceed ?  ?15. UTI with <100,000 E Coli. Keflex started. Sens to Keflex finished 7-day treatment ? 16.  Anemia with normal plt and WBCs, counts are stable  ? ?  Latest Ref Rng & Units 03/07/2022  ?  9:59 AM 02/27/2022  ?  5:20 AM 02/26/2022  ?  2:36 AM  ?CBC  ?WBC 4.0 - 10.5 K/uL 7.8   10.5   11.7    ?Hemoglobin 13.0 - 17.0 g/dL 7.9   8.0   8.7    ?Hematocrit 39.0 - 52.0 % 25.5   24.7   26.7    ?Platelets 150 - 400 K/uL 235   342   359    ?17.  CKD stage III ? Creatinine 1.69 on 4/4, labs ordered for Monday ? ?LOS: ?12 days ?A FACE TO FACE EVALUATION WAS PERFORMED ? ?Vernon [name] ?03/10/2022, 11:53 AM  ? ? ? ?

## 2022-03-10 NOTE — Progress Notes (Signed)
Palliative: ? 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  He required intubation for airway protection and underwent emergent craniotomy on 3/18 with hematoma evacuation and drain placement.   Palliative care has been asked to get involved to further discuss goals of care in the setting of ICH and ongoing/worsening encephalopathy.    ? ?Went to the bedside this morning to assess Vernon [name] -he appears to be doing well, about the same as yesterday.  Again he was able to accurately describe to me his clinical course, he tells me about his PT session this morning, he is even able to articulate that he will have a swallow study on Monday and is hopeful he can be freed from the feeding tube if that goes well.  Vernon [name] tells me he feels very well and is not having any pain.  He tells me he is pleased with his progress.   ? ?Call made to patient's wife Vernon [name] and I shared update with her.  We again discussed Vernon [name] ongoing improvement.  We also agrees that Vernon [name] is improving and she is hopeful that she is able to line up a good plan for him at home.  She is hopeful that he does well with his swallowing study on Monday.  Vernon [name] shares she was able to speak with a TOC team yesterday about potential resources at home. ? ?Exam: Awake, alert, oriented. Speech clear. No distress. Respirations regular.  [name] remains in place with tube feed infusing. ? ?Plan: ?No longer appropriate for hospice inpatient unit ?Per neuropsych evaluation, patient has capacity to participate in medical decision making ?continue rehab ?PMT will follow chart, wife has her contact information and will call us as needed ? ?Juel Burrow, DNP, AGNP-C ?Palliative Medicine Team ?Team Phone # [phone]  ?Pager # [phone] ? ? ?

## 2022-03-10 NOTE — Progress Notes (Signed)
Physical Therapy Session Note ? ?Patient Details  ?Name: Vernon [name] ?MRN: [id number] ?Date of Birth: [birth year] ? ?Today's Date: 03/10/2022 ?PT Individual Time: 1191-4782 ?PT Individual Time Calculation (min): 54 min  ? ?Short Term Goals: ?Week 2:  PT Short Term Goal 1 (Week 2): Pt will perform bed mobility with consistent CGA. ?PT Short Term Goal 2 (Week 2): Pt will perform sit<>stand transfers with consistent MinA. ?PT Short Term Goal 3 (Week 2): Pt will perform stand pivot transfers with consistent ModA +1. ?PT Short Term Goal 4 (Week 2): Pt will ambulate 52ft using LRAD with consistent MinA. ? ?Skilled Therapeutic Interventions/Progress Updates:  ? ?Pt received supine in bed and agreeable to PT. Supine>sit transfer with CGA on the LUE to push into sitting. PT assisted pt to don ted hose and Bil shoes. Ambulatory transfer to toilet with RW and min-mod assist for safety overe threshold and transfer to [facility]. Donning pants sitting on BSC with total A for time management over feet and min assist for balanc eto pull to waist from knees. Ambulatory transfer to [facility] with min assist and moderate cues for safety of AD management.  ? ?Pt transported to rehab gym. Gait training with RW to weave through 3 cones and then to chair at end of room x 35ft with min assist and moderate cues for step length on the LLE when fatigued. Mild nausea with emesis sitting in chair lasting ~3 minutes. Additional gait training x 82ft with RW and ankle weight with min assist from PT with cues for posture and safety in turn to sit.  ?Patient returned to room and performed stand pivot to recliner with RW and min assist from PT for safety. PT provided pt with ice chips sitting up in recliner with spoon. No complications.. Pt left sitting in recliner with call bell in reach and all needs met.   ?  ? ?   ? ?Therapy Documentation ?Precautions:  ?Precautions ?Precautions: Fall ?Precaution Comments: [name], L weakness, L coordination  deficit ?Restrictions ?Weight Bearing Restrictions: No ? ?  ?Pain: ?denies ? ? ?Therapy/Group: Individual Therapy ? ?Lorie Phenix ?03/10/2022, 8:57 AM  ?

## 2022-03-11 LAB — GLUCOSE, CAPILLARY
Glucose-Capillary: 139 mg/dL — ABNORMAL HIGH (ref 70–99)
Glucose-Capillary: 145 mg/dL — ABNORMAL HIGH (ref 70–99)
Glucose-Capillary: 154 mg/dL — ABNORMAL HIGH (ref 70–99)
Glucose-Capillary: 171 mg/dL — ABNORMAL HIGH (ref 70–99)
Glucose-Capillary: 184 mg/dL — ABNORMAL HIGH (ref 70–99)
Glucose-Capillary: 194 mg/dL — ABNORMAL HIGH (ref 70–99)
Glucose-Capillary: 195 mg/dL — ABNORMAL HIGH (ref 70–99)

## 2022-03-11 NOTE — Progress Notes (Signed)
Inpatient Rehabilitation Discharge Medication Review by a Pharmacist ? ?A complete drug regimen review was completed for this patient to identify any potential clinically significant medication issues. ? ?High Risk Drug Classes Is patient taking? Indication by Medication  ?Antipsychotic Yes Zyprexa- agitation  ?Anticoagulant Yes Heparin- VTE prophylaxis  ?Antibiotic Yes Keflex- UTI  ?Opioid No   ?Antiplatelet No   ?Hypoglycemics/insulin Yes iSS, Semglee- T2DM  ?Vasoactive Medication Yes Coreg- hypertension, rate control  ?Chemotherapy No   ?Other Yes Protonix- GERD  ? ? ? ?Type of Medication Issue Identified Description of Issue Recommendation(s)  ?Drug Interaction(s) (clinically significant) ?    ?Duplicate Therapy ?    ?Allergy ?    ?No Medication Administration End Date ?    ?Incorrect Dose ?    ?Additional Drug Therapy Needed ?    ?Significant med changes from prior encounter (inform family/care partners about these prior to discharge).    ?Other ? Apixaban Pursuing hospice care with no aggressive treatments. Apixaban held by neurology. Continue heparin SQ upon transfer.   ? ? ?Clinically significant medication issues were identified that warrant physician communication and completion of prescribed/recommended actions by midnight of the next day:  No ? ? ?Time spent performing this drug regimen review (minutes):  30 ? ?Severn Goddard BS, PharmD, BCPS ?Clinical Pharmacist ?03/11/2022 7:26 PM ? ?Contact: [phone] after 3 PM ? ?"Be curious, not judgmental..." -Jamal Maes ?

## 2022-03-11 NOTE — Progress Notes (Addendum)
?                                                       PROGRESS NOTE ? ? ?Subjective/Complaints: ?Patient seen sitting up in bed this morning.  He is about to work with therapies.  He states he slept well overnight.  He denies complaints. ? ?ROS- denies CP, SOB , N/V/D ? ? ?Objective: ?  ?No results found. ?No results for input(s): WBC, HGB, HCT, PLT in the last 72 hours. ? ? ?No results for input(s): NA, K, CL, CO2, GLUCOSE, BUN, CREATININE, CALCIUM in the last 72 hours. ? ? ?Intake/Output Summary (Last 24 hours) at 03/11/2022 0950 ?Last data filed at 03/11/2022 8144 ?Gross per 24 hour  ?Intake 23073.83 ml  ?Output 1125 ml  ?Net [phone] ml  ? ?  ? ?  ? ?Physical Exam: ?Vital Signs ?Blood pressure 138/69, pulse 63, temperature 98 ?F (36.7 ?C), temperature source Oral, resp. rate 16, height '5\' 9"'$  (1.753 m), weight 85.1 kg, SpO2 97 %. ? ? ?General: No acute distress.  + NG. ?Mood and affect are appropriate ?Heart: Regular rate.  + Murmur. ?Lungs: Clear to auscultation, breathing unlabored, no rales or wheezes ?Abdomen: Positive bowel sounds, soft nontender to palpation, nondistended ?Extremities: No clubbing, cyanosis, or edema ?Skin: No evidence of breakdown, no evidence of rash ?Neurologic: Alert ?Motor: 5/5 in bilateral deltoid, bicep, tricep, grip, hip flexor, knee extensors, ankle dorsiflexor and plantar flexor ?Cerebellar exam LUE ataxia, improving ?Musculoskeletal: Full range of motion in all 4 extremities. No joint swelling ? ?Assessment/Plan: ?1. Functional deficits which require 3+ hours per day of interdisciplinary therapy in a comprehensive inpatient rehab setting. ?Physiatrist is providing close team supervision and 24 hour management of active medical problems listed below. ?Physiatrist and rehab team continue to assess barriers to discharge/monitor patient progress toward functional and medical goals ? ?Care Tool: ? ?Bathing ? Bathing activity did not occur: Safety/medical concerns ?Body parts bathed  by patient: Right arm, Left arm, Chest, Abdomen, Face  ? Body parts bathed by helper: Left lower leg, Right lower leg, Buttocks ?  ?  ?Bathing assist Assist Level: Supervision/Verbal cueing ?  ?  ?Upper Body Dressing/Undressing ?Upper body dressing Upper body dressing/undressing activity did not occur (including orthotics): Safety/medical concerns ?What is the patient wearing?: Pull over shirt ?   ?Upper body assist Assist Level: Supervision/Verbal cueing ?   ?Lower Body Dressing/Undressing ?Lower body dressing ? ? ? Lower body dressing activity did not occur: Safety/medical concerns ?What is the patient wearing?: Pants, Incontinence brief ? ?  ? ?Lower body assist Assist for lower body dressing: Moderate Assistance - Patient 50 - 74% ?   ? ?Toileting ?Toileting    ?Toileting assist Assist for toileting: Maximal Assistance - Patient 25 - 49% ?  ?  ?Transfers ?Chair/bed transfer ? ?Transfers assist ? Chair/bed transfer activity did not occur: Safety/medical concerns ? ?Chair/bed transfer assist level: Minimal Assistance - Patient > 75% ?  ?  ?Locomotion ?Ambulation ? ? ?Ambulation assist ? ? Ambulation activity did not occur: Safety/medical concerns ? ?  ?  ?   ? ?Walk 10 feet activity ? ? ?Assist ? Walk 10 feet activity did not occur: Safety/medical concerns ? ?  ?   ? ?Walk 50 feet activity ? ? ?Assist Walk 50 feet with 2  turns activity did not occur: Safety/medical concerns ? ?  ?   ? ? ?Walk 150 feet activity ? ? ?Assist Walk 150 feet activity did not occur: Safety/medical concerns ? ?  ?  ?  ? ?Walk 10 feet on uneven surface  ?activity ? ? ?Assist Walk 10 feet on uneven surfaces activity did not occur: Safety/medical concerns ? ? ?  ?   ? ?Wheelchair ? ? ? ? ?Assist Is the patient using a wheelchair?: Yes ?Type of Wheelchair: Manual ?Wheelchair activity did not occur: Safety/medical concerns ? ?  ?   ? ? ?Wheelchair 50 feet with 2 turns activity ? ? ? ?Assist ? ?  ?Wheelchair 50 feet with 2 turns activity did  not occur: Safety/medical concerns ? ? ?   ? ?Wheelchair 150 feet activity  ? ? ? ?Assist ? Wheelchair 150 feet activity did not occur: Safety/medical concerns ? ? ?   ? ?Blood pressure 138/69, pulse 63, temperature 98 ?F (36.7 ?C), temperature source Oral, resp. rate 16, height '5\' 9"'$  (1.753 m), weight 85.1 kg, SpO2 97 %. ? ?Medical Problem List and Plan: ?1. 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 Continue CIR ?2.  Antithrombotics: ?-DVT/anticoagulation:  Pharmaceutical: Heparin initiated 02/12/2022 ?            -antiplatelet therapy: N/A ?3. Pain Management: Tylenol as needed ?4. Decreased arousal:  ?-continue Amantadine 100 mg twice daily for arousal ?            -adjust timing  ?-check sleep chart ?-consider adjustment to shunt settings if no further improvement? ?-urine culture never done. Will recheck on admit ?            -antipsychotic agents: Zyprexa via 2.5 mg twice daily which seems to have helped with agitation- may start to wean once pt stops trying to get OOB at noc, still impulsive in PT ? Improved ?5. Neuropsych: This patient is capable of making medical decisions on his own behalf. ?Will ask Neuropsych to help delineate his cognitive issues and capacity ?SLP has noted improvement in cognition as well ? TeleSitter for safety ?6. Skin/Wound Care: Routine skin checks ?7. Fluids/Electrolytes/Nutrition: Routine in and outs ?8.  Postop large right-sided pleural effusion.  Status postthoracentesis with 1250 cc yield.  Latest chest x-ray unremarkable ?9.  Seizure prophylaxis.  Keppra 500 mg twice daily completed ?10.  Atrial fibrillation.  Eliquis currently on hold.  Neurology considering resuming in approximately 4 weeks.  Cardiac rate controlled ?11.  Diabetes mellitus with hyperglycemia.  Hemoglobin A1c 6.2.  NovoLog  2 units every 4 hours, Semglee 30 units daily ?CBG (last 3)  ?Recent Labs  ?  03/11/22 ?0003 03/11/22 ?0405 03/11/22 ?0823  ?GLUCAP 139* 171* 195*  ? ?Increase Semglee to 42U- controlled 03/08/22, if NG tube stop will need to reduce Semglee to 10U  ? Elevated on 4/16, will await for swallow evaluation prior to further medication changes ?12.  Chronic anemia.   ? Hemoglobin 7.9 on 4/12, relatively stable, labs ordered for Monday ?13  Hypertension.  Continue Coreg 3.125 mg twice daily.  Monitor with increased mobility ?          ?Vitals:  ? 03/10/22 1933 03/11/22 0408  ?BP: 120/87 138/69  ?Pulse: 60 63  ?Resp: 16 16  ?Temp: (!) 97.3 ?F (36.3 ?C) 98 ?F (36.7 ?C)  ?SpO2: 96% 97%  ?  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  Nasogastric tube feeds.  Dietary and speech therapy follow-up ?            Repeat MBS per SLP, pt can make decision whether he wishes to proceed ?  ?15. UTI with <100,000 E Coli. Keflex started. Sens to Keflex finished 7-day treatment ? 16.  Anemia with normal plt and WBCs, counts are stable  ? ?  Latest Ref Rng & Units 03/07/2022  ?  9:59 AM 02/27/2022  ?  5:20 AM 02/26/2022  ?  2:36 AM  ?CBC  ?WBC 4.0 - 10.5 K/uL 7.8   10.5   11.7    ?Hemoglobin 13.0 - 17.0 g/dL 7.9   8.0   8.7    ?Hematocrit 39.0 - 52.0 % 25.5   24.7   26.7    ?Platelets 150 - 400 K/uL 235   342   359    ?17.  CKD stage III ? Creatinine 1.69 on 4/4, labs ordered for tomorrow ? ?LOS: ?13 days ?A FACE TO FACE EVALUATION WAS PERFORMED ? ?Vincie Linn Lorie Phenix ?03/11/2022, 9:50 AM  ? ? ? ?

## 2022-03-11 NOTE — Progress Notes (Addendum)
Speech Language Pathology Daily Session Note ? ?Patient Details  ?Name: Vernon [name] ?MRN: [id number] ?Date of Birth: 06-08-1946 ? ?Today's Date: 03/11/2022 ?SLP Individual Time: 4010-2725 ?SLP Individual Time Calculation (min): 30 min ? ?Short Term Goals: ?Week 2: SLP Short Term Goal 1 (Week 2): Pt will participate instrumental swallow assessment to determine appropriateness for PO diet ?SLP Short Term Goal 2 (Week 2): Pt will increase orientation to biographical info, place, time and recent events provided min A multimodal cues ?SLP Short Term Goal 3 (Week 2): Pt will increase speech intelligibility at the conversation level to >90% with sup A verbal cues ?SLP Short Term Goal 4 (Week 2): Pt will complete basic problem solving with min A cues ?SLP Short Term Goal 5 (Week 2): Pt will increase sustained attention to functional tasks to 15 minutes provided min A cues ?SLP Short Term Goal 6 (Week 2): Pt will follow 2-step directions during functional tasks with 80% accuracy with min cues ? ?Skilled Therapeutic Interventions: Pt seen in room for skilled slp intervention. Oral care completed prior to beginning session. Pt seen with ice chips and pureed by tsp. Voice was clear after all trials. Coughing noted x1 during session. Pt reported he felt melted ice trickle down throat before he could swallow. He consumed 1 tsp of puree then refused further trials stating he disliked texture of yogurt. Trials of pureed ended. Reminded pt of MBS tomorrow morning. Pt left seated upright in bed with with bed alarm set and call button within reach. Cont with therapy per plan of care.  ?   ? ?Pain ?Pain Assessment ?Pain Scale: Faces ?Faces Pain Scale: No hurt ? ?Therapy/Group: Individual Therapy ? ?Gregary Signs A Malania Gawthrop ?03/11/2022, 8:21 AM ?

## 2022-03-11 NOTE — Progress Notes (Signed)
Pt has refused Nystatin for 2 days in a row. Pt states that the taste is un-tolerable. Nursing witness patient start to dry heave into emesis bag. PT stated that just the thought of medication made him sick, pt requesting other alternatives to clear up thrush since medication reminds him of raw eggs.  ?

## 2022-03-12 ENCOUNTER — Inpatient Hospital Stay: Payer: Medicare Other

## 2022-03-12 ENCOUNTER — Inpatient Hospital Stay (HOSPITAL_COMMUNITY): Payer: Medicare Other

## 2022-03-12 ENCOUNTER — Inpatient Hospital Stay: Payer: Medicare Other | Admitting: Internal Medicine

## 2022-03-12 LAB — CBC WITH DIFFERENTIAL/PLATELET
Abs Immature Granulocytes: 0.09 10*3/uL — ABNORMAL HIGH (ref 0.00–0.07)
Basophils Absolute: 0 10*3/uL (ref 0.0–0.1)
Basophils Relative: 1 %
Eosinophils Absolute: 0.3 10*3/uL (ref 0.0–0.5)
Eosinophils Relative: 4 %
HCT: 25.3 % — ABNORMAL LOW (ref 39.0–52.0)
Hemoglobin: 7.8 g/dL — ABNORMAL LOW (ref 13.0–17.0)
Immature Granulocytes: 1 %
Lymphocytes Relative: 17 %
Lymphs Abs: 1.3 10*3/uL (ref 0.7–4.0)
MCH: 28.6 pg (ref 26.0–34.0)
MCHC: 30.8 g/dL (ref 30.0–36.0)
MCV: 92.7 fL (ref 80.0–100.0)
Monocytes Absolute: 0.6 10*3/uL (ref 0.1–1.0)
Monocytes Relative: 9 %
Neutro Abs: 5.1 10*3/uL (ref 1.7–7.7)
Neutrophils Relative %: 68 %
Platelets: 274 10*3/uL (ref 150–400)
RBC: 2.73 MIL/uL — ABNORMAL LOW (ref 4.22–5.81)
RDW: 26.6 % — ABNORMAL HIGH (ref 11.5–15.5)
WBC: 7.4 10*3/uL (ref 4.0–10.5)
nRBC: 0.5 % — ABNORMAL HIGH (ref 0.0–0.2)

## 2022-03-12 LAB — GLUCOSE, CAPILLARY
Glucose-Capillary: 117 mg/dL — ABNORMAL HIGH (ref 70–99)
Glucose-Capillary: 121 mg/dL — ABNORMAL HIGH (ref 70–99)
Glucose-Capillary: 131 mg/dL — ABNORMAL HIGH (ref 70–99)
Glucose-Capillary: 163 mg/dL — ABNORMAL HIGH (ref 70–99)
Glucose-Capillary: 184 mg/dL — ABNORMAL HIGH (ref 70–99)
Glucose-Capillary: 77 mg/dL (ref 70–99)
Glucose-Capillary: 96 mg/dL (ref 70–99)

## 2022-03-12 LAB — BASIC METABOLIC PANEL
Anion gap: 7 (ref 5–15)
BUN: 54 mg/dL — ABNORMAL HIGH (ref 8–23)
CO2: 24 mmol/L (ref 22–32)
Calcium: 9.1 mg/dL (ref 8.9–10.3)
Chloride: 106 mmol/L (ref 98–111)
Creatinine, Ser: 2.06 mg/dL — ABNORMAL HIGH (ref 0.61–1.24)
GFR, Estimated: 33 mL/min — ABNORMAL LOW (ref >60)
Glucose, Bld: 168 mg/dL — ABNORMAL HIGH (ref 70–99)
Potassium: 5.3 mmol/L — ABNORMAL HIGH (ref 3.5–5.1)
Sodium: 137 mmol/L (ref 135–145)

## 2022-03-12 MED ORDER — INSULIN GLARGINE-YFGN 100 UNIT/ML ~~LOC~~ SOLN
20.0000 [IU] | Freq: Every day | SUBCUTANEOUS | Status: DC
  Administered 2022-03-13: 20 [IU] via SUBCUTANEOUS
  Filled 2022-03-12 (×2): qty 0.2

## 2022-03-12 MED ORDER — FREE WATER
250.0000 mL | Status: DC
  Administered 2022-03-12 – 2022-03-13 (×7): 250 mL

## 2022-03-12 MED ORDER — ONDANSETRON HCL 4 MG PO TABS
4.0000 mg | ORAL_TABLET | Freq: Three times a day (TID) | ORAL | Status: DC | PRN
  Administered 2022-03-12 – 2022-03-14 (×3): 4 mg via ORAL
  Filled 2022-03-12 (×3): qty 1

## 2022-03-12 NOTE — Progress Notes (Addendum)
Modified Barium Swallow Progress Note ? ?Patient Details  ?Name: Vernon [name] ?MRN: [id number] ?Date of Birth: 01-Feb-1946 ? ?Today's Date: 03/12/2022 ? ?Modified Barium Swallow completed.  Full report located under Chart Review in the Imaging Section. ? ?Brief recommendations include the following: ? ?Clinical Impression ?Per instrumental findings via MBSS, pt presents with mild oropharyngeal dysphagia. Oral phase c/b reduced posterior propulsion and munching/mashing mastication pattern in the setting of missing dentition and no dentures present. Pharyngeal phase c/b decreased BOT approximation to posterior pharyngeal wall and reduced pharyngeal peristalsis resulting in mild vallecular residuals and minimal pyriform sinus residuals across consistencies. No evidence of penetration nor aspiration observed with any of the consistencies assessed (thin liquid via tsp, cup, straw; nectar-thick liquid via cup; pureed/Dysphagia 1; chopped/Dysphagia 2; mixed consistencies). Volitional/cued secondary swallow generally effective in clearing pharyngeal residuals. Throat clear observed x 1 post-swallow with no evidence of penetration nor aspiration on fluoro.  ? ?Given instrumental findings, recommend initiation of Dysphagia 2 textures with thin liquids given implementation of aspiration precautions to include slow rate, small + single bites/sips, upright positioning, awake/alert, and use of double swallow. Recommend medications provided crushed or whole via puree. May upgrade solids textures, clinically, when dentures are present. Results and recommendations reviewed with pt who verbalized understanding and agreement. MD and LPN notified. ?  ?Swallow Evaluation Recommendations ? ? SLP Diet Recommendations: Dysphagia 2 (Fine chop) solids;Thin liquid ? ? Liquid Administration via: Cup;Straw ? ? Medication Administration: Whole meds with puree ? ? Supervision: Staff to assist with self feeding;Full assist for feeding ? ?  Compensations: Slow rate;Small sips/bites;Follow solids with liquid;Multiple dry swallows after each bite/sip ? ? Postural Changes: Seated upright at 90 degrees ? ? Oral Care Recommendations: Oral care BID ? ?   ? ? ? ?Berkley Wrightsman A Hicks Feick ?03/12/2022,2:56 PM ?

## 2022-03-12 NOTE — Progress Notes (Signed)
Physical Therapy Session Note ? ?Patient Details  ?Name: Vernon [name] ?MRN: [id number] ?Date of Birth: 01/20/1946 ? ?Today's Date: 03/12/2022 ?PT Individual Time: 0175-1025 ?PT Individual Time Calculation (min): 63 min  ? ?Short Term Goals: ?Week 1:  PT Short Term Goal 1 (Week 1): Pt will perform bed mobility with consistent MinA. ?PT Short Term Goal 1 - Progress (Week 1): Met ?PT Short Term Goal 2 (Week 1): Pt will perform sit<>stand transfers with consistent ModA. ?PT Short Term Goal 2 - Progress (Week 1): Met ?PT Short Term Goal 3 (Week 1): Pt will perform stand pivot transfers with consistent ModA +1. ?PT Short Term Goal 3 - Progress (Week 1): Progressing toward goal ?PT Short Term Goal 4 (Week 1): Pt will initiate gait training. ?PT Short Term Goal 4 - Progress (Week 1): Met ?Week 2:  PT Short Term Goal 1 (Week 2): Pt will perform bed mobility with consistent CGA. ?PT Short Term Goal 2 (Week 2): Pt will perform sit<>stand transfers with consistent MinA. ?PT Short Term Goal 3 (Week 2): Pt will perform stand pivot transfers with consistent ModA +1. ?PT Short Term Goal 4 (Week 2): Pt will ambulate 34ft using LRAD with consistent MinA. ? ?Skilled Therapeutic Interventions/Progress Updates:  ?Patient supine in bed on entrance to room. Patient alert and agreeable to PT session. Relates that he "did great" at the swallow study and had his diet progressed. ST sign in room upgraded to D2 diet with thin liquids and full supervision. Fatigued and with n/v d/t barium from swallow study. ? ?Patient with no pain complaint throughout session. ? ?Therapeutic Activity: ?Bed Mobility: Patient performed supine --> sit with MinA for push with LUE to reach seated position on EOB. VC/ tc required for technique and effort. Pt guided in dressing while seated EOB. Requires vc/ tc for L attn and technique to complete with up to MinA to perform. LUE and LE ataxia noted during dressing task and pt with minimal LOB that he is able to self  correct with effort and so requires rest between efforts.  ? ?Transfers: Patient performed squat pivot to R and L sides to/ from bed<> w/c with MinA/ CGA and vc/tc for hand placement, foot placement, and technique. Sit<>stand transfers performed throughout session with CGA to modA for balance. Stand pivot transfers throughout session with ModA d/t pt's impulsivity to reach seat when fatigued. Extra time in seated rest break after efforts for fatigue. Provided verbal cues for technique. ? ?Gait Training:  ?Patient ambulated 65 ft with 3# aw donned to L ankle, using RW with up to [place] for balance. Demonstrated good ability to advance LLE with aw. Most assist required for balance, ataxic movements from LUE, RW mgmt. Provided vc/ tc throughout for maintaining pace. VC/ tc also for maintaining position in front of RW during pivot to sit at w/c at end of bout. Pt reaching out with LUE to armrest and sliding impulsively into seat. Education provided to complete turn prior to descent to sit. Upon finishing ambulation, pt c/o nausea and with violent bout of emesis d/t barium from swallow study. RN bring Zofran to assist with decreasing nausea.  ? ?Wheelchair Mobility:  ?Patient propelled wheelchair 120 feet with CGA. Provided vc for slowing pace and attn to L in order to safely maneuver through doorways and around corners without catching LUE. ? ?Neuromuscular Re-ed: ?NMR facilitated during session with focus on standing balance. Pt guided in balance attainment on rise to stand and then maintaining with use  of RW for UE support. Pt guided in NDT technique with appropriate forward lean. Despite repeated performance, pt continues to maintain initial balance over heels and with posterior bias requiring minA to lean forward. NMR performed for improvements in motor control and coordination, balance, sequencing, judgement, and self confidence/ efficacy in performing all aspects of mobility at highest level of independence.   ? ?Patient seated upright  in recliner at end of session with brakes locked, belt alarm set, and all needs within reach. Ginger ale provided by RN.  ? ? ?Therapy Documentation ?Precautions:  ?Precautions ?Precautions: Fall ?Precaution Comments: [name], L weakness, L coordination deficit ?Restrictions ?Weight Bearing Restrictions: No ?General: ?  ?Vital Signs: ? ?Pain: ? No pain complaint this session. Pt with episode of n/v.  ? ?Therapy/Group: Individual Therapy ? ?Alger Simons PT, DPT ?03/12/2022, 12:33 PM  ?

## 2022-03-12 NOTE — Progress Notes (Signed)
?                                                       PROGRESS NOTE ? ? ?Subjective/Complaints: ?Just back from MBS, per pt "did excellent", no SLP notes  ?Labs reviewed  ?Discussed with SLP, D2 , thin liquids ?ROS- denies CP, SOB , N/V/D ? ? ?Objective: ?  ?No results found. ?Recent Labs  ?  03/12/22 ?8366  ?WBC 7.4  ?HGB 7.8*  ?HCT 25.3*  ?PLT 274  ? ? ? ?Recent Labs  ?  03/12/22 ?2947  ?NA 137  ?K 5.3*  ?CL 106  ?CO2 24  ?GLUCOSE 168*  ?BUN 54*  ?CREATININE 2.06*  ?CALCIUM 9.1  ? ? ? ?Intake/Output Summary (Last 24 hours) at 03/12/2022 0949 ?Last data filed at 03/12/2022 0645 ?Gross per 24 hour  ?Intake --  ?Output 900 ml  ?Net -900 ml  ? ?  ? ?  ? ?Physical Exam: ?Vital Signs ?Blood pressure (!) 126/56, pulse (!) 59, temperature 98.4 ?F (36.9 ?C), temperature source Oral, resp. rate 14, height '5\' 9"'$  (1.753 m), weight 85.1 kg, SpO2 97 %. ? ? ?General: No acute distress.  + NG. ?Mood and affect are appropriate ?Heart: Regular rate.  + Murmur. ?Lungs: Clear to auscultation, breathing unlabored, no rales or wheezes ?Abdomen: Positive bowel sounds, soft nontender to palpation, nondistended ?Extremities: No clubbing, cyanosis, or edema ?Skin: No evidence of breakdown, no evidence of rash ?Neurologic: Alert ?Motor: 5/5 in bilateral deltoid, bicep, tricep, grip, hip flexor, knee extensors, ankle dorsiflexor and plantar flexor ?Cerebellar exam LUE ataxia, improving ?Musculoskeletal: Full range of motion in all 4 extremities. No joint swelling ? ?Assessment/Plan: ?1. Functional deficits which require 3+ hours per day of interdisciplinary therapy in a comprehensive inpatient rehab setting. ?Physiatrist is providing close team supervision and 24 hour management of active medical problems listed below. ?Physiatrist and rehab team continue to assess barriers to discharge/monitor patient progress toward functional and medical goals ? ?Care Tool: ? ?Bathing ? Bathing activity did not occur: Safety/medical concerns ?Body parts  bathed by patient: Right arm, Left arm, Chest, Abdomen, Face  ? Body parts bathed by helper: Left lower leg, Right lower leg, Buttocks ?  ?  ?Bathing assist Assist Level: Supervision/Verbal cueing ?  ?  ?Upper Body Dressing/Undressing ?Upper body dressing Upper body dressing/undressing activity did not occur (including orthotics): Safety/medical concerns ?What is the patient wearing?: Pull over shirt ?   ?Upper body assist Assist Level: Moderate Assistance - Patient 50 - 74% ?   ?Lower Body Dressing/Undressing ?Lower body dressing ? ? ? Lower body dressing activity did not occur: Safety/medical concerns ?What is the patient wearing?: Pants ? ?  ? ?Lower body assist Assist for lower body dressing: Total Assistance - Patient < 25% ?   ? ?Toileting ?Toileting    ?Toileting assist Assist for toileting: Total Assistance - Patient < 25% ?  ?  ?Transfers ?Chair/bed transfer ? ?Transfers assist ? Chair/bed transfer activity did not occur: Safety/medical concerns ? ?Chair/bed transfer assist level: Moderate Assistance - Patient 50 - 74% ?  ?  ?Locomotion ?Ambulation ? ? ?Ambulation assist ? ? Ambulation activity did not occur: Safety/medical concerns ? ?  ?  ?   ? ?Walk 10 feet activity ? ? ?Assist ? Walk 10 feet activity did not occur:  Safety/medical concerns ? ?  ?   ? ?Walk 50 feet activity ? ? ?Assist Walk 50 feet with 2 turns activity did not occur: Safety/medical concerns ? ?  ?   ? ? ?Walk 150 feet activity ? ? ?Assist Walk 150 feet activity did not occur: Safety/medical concerns ? ?  ?  ?  ? ?Walk 10 feet on uneven surface  ?activity ? ? ?Assist Walk 10 feet on uneven surfaces activity did not occur: Safety/medical concerns ? ? ?  ?   ? ?Wheelchair ? ? ? ? ?Assist Is the patient using a wheelchair?: Yes ?Type of Wheelchair: Manual ?Wheelchair activity did not occur: Safety/medical concerns ? ?  ?   ? ? ?Wheelchair 50 feet with 2 turns activity ? ? ? ?Assist ? ?  ?Wheelchair 50 feet with 2 turns activity did not  occur: Safety/medical concerns ? ? ?   ? ?Wheelchair 150 feet activity  ? ? ? ?Assist ? Wheelchair 150 feet activity did not occur: Safety/medical concerns ? ? ?   ? ?Blood pressure (!) 126/56, pulse (!) 59, temperature 98.4 ?F (36.9 ?C), temperature source Oral, resp. rate 14, height '5\' 9"'$  (1.753 m), weight 85.1 kg, SpO2 97 %. ? ?Medical Problem List and Plan: ?1. 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 Continue CIR ?2.  Antithrombotics: ?-DVT/anticoagulation:  Pharmaceutical: Heparin initiated 02/12/2022 ?            -antiplatelet therapy: N/A ?3. Pain Management: Tylenol as needed ?4. Decreased arousal as well as agitation improved :  ?Improved d/c amantadine, d/c zyprexa   ? ?5. Neuropsych: This patient is capable of making medical decisions on his own behalf. ?Will ask Neuropsych to help delineate his cognitive issues and capacity ?SLP has noted improvement in cognition as well ? TeleSitter for safety ?6. Skin/Wound Care: Routine skin checks ?7. Fluids/Electrolytes/Nutrition: Routine in and outs ?8.  Postop large right-sided pleural effusion.  Status postthoracentesis with 1250 cc yield.  Latest chest x-ray unremarkable ?9.  Seizure prophylaxis.  Keppra 500 mg twice daily completed ?10.  Atrial fibrillation.  Eliquis currently on hold.  Neurology considering resuming in approximately 4 weeks.  Cardiac rate controlled ?11.  Diabetes mellitus with hyperglycemia.  Hemoglobin A1c 6.2.  NovoLog 2 units every 4 hours, Semglee 30 units daily ?CBG (last 3)  ?Recent Labs  ?  03/12/22 ?0011 03/12/22 ?0413 03/12/22 ?2637  ?GLUCAP 121* 163* 184*  ? ?Increase Semglee to 42U- controlled 03/08/22, if NG tube stop will need to reduce Semglee to 10U  ? Fair control  ?12.  Chronic anemia.   ? Hemoglobin 7.9 on 4/12, relatively stable, labs  ordered for Monday ?13  Hypertension.  Continue Coreg 3.125 mg twice daily.  Monitor with increased mobility ?          ?Vitals:  ? 03/11/22 2008 03/12/22 0416  ?BP: 123/62 (!) 126/56  ?Pulse: 60 (!) 59  ?Resp: 17 14  ?Temp: 98.5 ?F (36.9 ?C) 98.4 ?F (36.9 ?C)  ?SpO2: 97% 97%  ? 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  Nasogastric tube feeds.  Dietary and speech therapy follow-up ?            Repeat MBS per SLP, pt can make decision whether he wishes to proceed ?  ?15. UTI with <100,000 E Coli. Keflex started. Sens to Keflex finished  7-day treatment ? 16.  Anemia with normal plt and WBCs, counts are stable 4/17 ? ?  Latest Ref Rng & Units 03/12/2022  ?  5:13 AM 03/07/2022  ?  9:59 AM 02/27/2022  ?  5:20 AM  ?CBC  ?WBC 4.0 - 10.5 K/uL 7.4   7.8   10.5    ?Hemoglobin 13.0 - 17.0 g/dL 7.8   7.9   8.0    ?Hematocrit 39.0 - 52.0 % 25.3   25.5   24.7    ?Platelets 150 - 400 K/uL 274   235   342    ?17.  CKD stage III ? Creatinine 1.69 on 4/4, up to 2.09, increase free H20, no nephro toxic meds ? ?LOS: ?14 days ?A FACE TO FACE EVALUATION WAS PERFORMED ? ?Luanna Salk Nohelani Benning ?03/12/2022, 9:49 AM  ? ? ? ?

## 2022-03-12 NOTE — Progress Notes (Signed)
Occupational Therapy Session Note ? ?Patient Details  ?Name: Vernon [name] ?MRN: [id number] ?Date of Birth: June 04, 1946 ? ?Today's Date: 03/12/2022 ?OT Individual Time: 7460-0298 ?OT Individual Time Calculation (min): 30 min  ? ? ?Short Term Goals: ?Week 2:  OT Short Term Goal 1 (Week 2): Pt will complete toilet transfer with max A and LRAD. ?OT Short Term Goal 2 (Week 2): Pt will complete sit to stand with min A in prep for standing ADL. ?OT Short Term Goal 3 (Week 2): Pt will complete > 2 grooming task at sink with S. ?OT Short Term Goal 4 (Week 2): Pt will utilize external aids to with no more than mod cuing to orient self to location. ? ?Skilled Therapeutic Interventions/Progress Updates:  ?  Pt received in recliner with belt alarm on. Pt stated he was pretty fatigued from earlier sessions but very happy he can eat and drink now.  Offered pt a shower but he wanted to wait until tomorrow.   ?Today's session focused on L side coordination with [name] using 2# dowel bar and [name] using small 1/2 pegs with board.  Pt tends to move quickly and the fast movement patterns increase ataxia in hand.  Encouraged him to move more slowly and this did help his control. Pt desires to return to playing the guitar. Pt participated well.  Resting in recliner with all needs met.  ? ?Therapy Documentation ?Precautions:  ?Precautions ?Precautions: Fall ?Precaution Comments: [name], L weakness, L coordination deficit ?Restrictions ?Weight Bearing Restrictions: No ? ?  ?Vital Signs: ?Therapy Vitals ?Temp: 97.7 ?F (36.5 ?C) ?Temp Source: Oral ?Pulse Rate: 63 ?Resp: 18 ?BP: 103/71 ?Patient Position (if appropriate): Sitting ?Oxygen Therapy ?SpO2: 97 % ?O2 Device: Room Air ?Pain: ? No c/o pain ? ? ?Therapy/Group: Individual Therapy ? ?[name] ?03/12/2022, 4:23 PM ?

## 2022-03-12 NOTE — Progress Notes (Signed)
Physical Therapy Session Note ? ?Patient Details  ?Name: Vernon [name] ?MRN: [id number] ?Date of Birth: 07-21-1946 ? ?Today's Date: 03/12/2022 ?PT Individual Time: 9702-6378 ?PT Individual Time Calculation (min): 56 min  ? ?Short Term Goals: ?Week 2:  PT Short Term Goal 1 (Week 2): Pt will perform bed mobility with consistent CGA. ?PT Short Term Goal 2 (Week 2): Pt will perform sit<>stand transfers with consistent MinA. ?PT Short Term Goal 3 (Week 2): Pt will perform stand pivot transfers with consistent ModA +1. ?PT Short Term Goal 4 (Week 2): Pt will ambulate 44ft using LRAD with consistent MinA. ? ?Skilled Therapeutic Interventions/Progress Updates:  ?   ?Pt sitting in recliner to start - family member at bedside and RN assisting with flushing tube feeds. Pt's diet upgraded to D2 after MBS - pt reporting lack of appetite and only had a few bites of his lunch, but ate all his ice cream. Donned 3lb ankle weight to LLE for session to assist with proprioception during functional mobility.  ? ?Completed ambulatory transfer from recliner to w/c within his room with RW and min/modA for safety and managing RW. Transported to day room rehab gym for time and assisted to [place] with min/modA stand<>pivot transfer - pt mildly impulsive with poor safety awareness during transfer, poor controlled lowering to sitting surface. Completed 3x2 minutes with resistance set to 5, using BUE/BLE - emphasis on L sided coordination and rhythmic stepping - completed 457 steps total and pt reporting 5-7/10 RPE after each set. ? ? Instructed in gait training in hallways - ambulating ~72ft with modA and RW - pt with difficulty managing RW as it would drift to the L, needed assist for stabilizing RW and cues for keeping body within walker frame - he struggled with L sided ataxia during gait with decreased step length when fatigued.  ? ?Completed 2x15 (standing rest break) toe taps to 4inch block with RW support and min/modA for standing balance -  pt with decreased safety awareness during activity - letting go of RW to pull pants up or demonstrate a gesture with his hands resulting in LOB that requires mod/maxA for recovery. Reminded him of importance of increasing safety awareness and pacing activity to reduce falls risk.  ? ?Returned to his room and assisted back to recliner with modA stand<>pivot transfer - again lacking controlled lowering to sitting surface. Remained seated in recliner with safety belt alarm on, call bell in lap, all needs met. Telesitter in room.  ? ?Therapy Documentation ?Precautions:  ?Precautions ?Precautions: Fall ?Precaution Comments: [name], L weakness, L coordination deficit ?Restrictions ?Weight Bearing Restrictions: No ?General: ?  ? ?Therapy/Group: Individual Therapy ? ?Mackensey Bolte P Selicia Windom ?03/12/2022, 7:49 AM  ?

## 2022-03-13 ENCOUNTER — Encounter (HOSPITAL_COMMUNITY): Payer: Self-pay | Admitting: Physical Medicine & Rehabilitation

## 2022-03-13 LAB — BASIC METABOLIC PANEL
Anion gap: 11 (ref 5–15)
BUN: 54 mg/dL — ABNORMAL HIGH (ref 8–23)
CO2: 24 mmol/L (ref 22–32)
Calcium: 9.1 mg/dL (ref 8.9–10.3)
Chloride: 102 mmol/L (ref 98–111)
Creatinine, Ser: 2.18 mg/dL — ABNORMAL HIGH (ref 0.61–1.24)
GFR, Estimated: 31 mL/min — ABNORMAL LOW (ref >60)
Glucose, Bld: 90 mg/dL (ref 70–99)
Potassium: 5.2 mmol/L — ABNORMAL HIGH (ref 3.5–5.1)
Sodium: 137 mmol/L (ref 135–145)

## 2022-03-13 LAB — GLUCOSE, CAPILLARY
Glucose-Capillary: 89 mg/dL (ref 70–99)
Glucose-Capillary: 129 mg/dL — ABNORMAL HIGH (ref 70–99)
Glucose-Capillary: 149 mg/dL — ABNORMAL HIGH (ref 70–99)
Glucose-Capillary: 136 mg/dL — ABNORMAL HIGH (ref 70–99)
Glucose-Capillary: 130 mg/dL — ABNORMAL HIGH (ref 70–99)

## 2022-03-13 MED ORDER — ENSURE MAX PROTEIN PO LIQD
11.0000 [oz_av] | Freq: Two times a day (BID) | ORAL | Status: DC
  Administered 2022-03-13: 11 [oz_av] via ORAL

## 2022-03-13 MED ORDER — SENNOSIDES-DOCUSATE SODIUM 8.6-50 MG PO TABS
1.0000 | ORAL_TABLET | Freq: Two times a day (BID) | ORAL | Status: DC
  Administered 2022-03-13 – 2022-03-20 (×12): 1 via ORAL
  Filled 2022-03-13 (×17): qty 1

## 2022-03-13 MED ORDER — POLYETHYLENE GLYCOL 3350 17 G PO PACK
17.0000 g | PACK | Freq: Every day | ORAL | Status: DC
  Administered 2022-03-14 – 2022-03-20 (×7): 17 g via ORAL
  Filled 2022-03-13 (×7): qty 1

## 2022-03-13 MED ORDER — PANTOPRAZOLE SODIUM 40 MG PO TBEC
40.0000 mg | DELAYED_RELEASE_TABLET | Freq: Every day | ORAL | Status: DC
  Administered 2022-03-14 – 2022-03-20 (×7): 40 mg via ORAL
  Filled 2022-03-13 (×7): qty 1

## 2022-03-13 MED ORDER — SODIUM CHLORIDE 0.45 % IV SOLN
INTRAVENOUS | Status: AC
  Administered 2022-03-13: 975 mL via INTRAVENOUS

## 2022-03-13 MED ORDER — SODIUM CHLORIDE 0.45 % IV SOLN
INTRAVENOUS | Status: DC
Start: 2022-03-13 — End: 2022-03-13

## 2022-03-13 MED ORDER — CARVEDILOL 3.125 MG PO TABS
3.1250 mg | ORAL_TABLET | Freq: Two times a day (BID) | ORAL | Status: DC
  Administered 2022-03-13 – 2022-03-15 (×4): 3.125 mg via ORAL
  Filled 2022-03-13 (×4): qty 1

## 2022-03-13 MED ORDER — ENSURE ENLIVE PO LIQD
237.0000 mL | Freq: Three times a day (TID) | ORAL | Status: DC
  Administered 2022-03-13 (×2): 237 mL via ORAL

## 2022-03-13 NOTE — Progress Notes (Addendum)
?                                                       PROGRESS NOTE ? ? ?Subjective/Complaints: ?Will drink ensure but has abnormal taste in mouth , blames barium ?Discussed renal status  ?ROS- denies CP, SOB , N/V/D ? ? ?Objective: ?  ?DG Swallowing Func-Speech Pathology ? ?Result Date: 03/12/2022 ?Table formatting from the original result was not included. Objective Swallowing Evaluation: Type of Study: MBS-Modified Barium Swallow Study  Patient Details Name: Vernon [name] MRN: [id number] Date of Birth: 05/19/1946 Today's Date: 03/12/2022 Time: SLP Start Time (ACUTE ONLY): 1000 -SLP Stop Time (ACUTE ONLY): 1020 SLP Time Calculation (min) (ACUTE ONLY): 20 min Past Medical History: Past Medical History: Diagnosis Date  Anemia   Aortic stenosis   Arthritis   Complication of anesthesia   hard time getting bp up after knee replacement  Diabetes mellitus without complication (HCC)   GERD (gastroesophageal reflux disease)   occ tums prn  History of hiatal hernia   Hypertension   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[place])  Past Surgical History: Past Surgical History: Procedure Laterality Date  CARPAL TUNNEL RELEASE  2012  CRANIOTOMY N/A 02/10/2022  Procedure: SUBOCCIPITAL CRANIECTOMY FOR EVACUATION OF CEREBELLAR HEMATOMA;  Surgeon: Kristeen Miss, MD;  Location: [facility];  Service: Neurosurgery;  Laterality: N/A;  JOINT REPLACEMENT Right 2010  SHOULDER ARTHROSCOPY WITH ROTATOR CUFF REPAIR AND OPEN BICEPS TENODESIS Right 11/09/2019  Procedure: RIGHT SHOULDER ARTHROSCOPY WITH SUBSCAPULARIS REPAIR, SUBACROMIAL DECOMPRESSION,MINI OPEN ROTATOR CUFF REPAIR;  Surgeon: Leim Fabry, MD;  Location: [facility];  Service: Orthopedics;  Laterality: Right; HPI: Pt is a 76 y.o. male who presented to the ED on 3/18 with worseing L side coordination and N/V. 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 Surrounding edema with mild effacement of  the fourth ventricle. Pt had an epsidoe fo emesis and developed hypoxemia, requiring intubation for airway protection. ETT 3/18-3/22. Pt s/p emergent craniotomy on 3/18, hematoma evacuation and drain placement. [name] placed 3/20. PMH: anemia, arthritis, GERD, DM2, GERD, HTN, afibb on Eliquis.  Pt follow up for swallow conducted as pt with agitation and AMS and was made NPO prior date.  Subjective: pt awake in bed, trying to get out of bed  Recommendations for follow up therapy are one component of a multi-disciplinary discharge planning process, led by the attending physician.  Recommendations may be updated based on patient status, additional functional criteria and insurance authorization. Assessment / Plan / Recommendation   03/12/2022   2:53 PM Clinical Impressions Clinical Impression Per instrumental findings via MBSS, pt presents with mild oropharyngeal dysphagia. Oral phase c/b reduced posterior propulsion and munching/mashing mastication pattern in the setting of missing dentition and no dentures present. Pharyngeal phase c/b decreased BOT approximation to posterior pharyngeal wall and reduced pharyngeal peristalsis resulting in mild vallecular residuals and minimal pyriform sinus residuals across consistencies. No evidence of penetration nor aspiration observed with any of the consistencies assessed (thin liquid via tsp, cup, straw; nectar-thick liquid via cup; pureed/Dysphagia 1; chopped/Dysphagia 2; mixed consistencies). Volitional/cued secondary swallow partially effective in clearing pharyngeal residuals. Throat clear observed x 1 post-swallow with no evidence of penetration nor aspiration on fluoro. Given instrumental findings, recommend initiation of Dysphagia 2 textures with thin  liquids given implementation of aspiration precautions to include slow rate, small + single bites/sips, upright positioning, awake/alert, and use of double swallow. Recommend medications provided crushed or whole via puree.  Results and recommendations reviewed with pt who verbalized understanding and agreement. MD and LPN notified. SLP Visit Diagnosis Dysphagia, oropharyngeal phase (R13.12) Impact on safety and function Mild aspiration risk     02/19/2022   2:01 PM Treatment Recommendations Treatment Recommendations Therapy as outlined in treatment plan below     03/12/2022   2:54 PM Prognosis Prognosis for Safe Diet Advancement Good Barriers to Reach Goals Cognitive deficits   03/12/2022   2:53 PM Diet Recommendations SLP Diet Recommendations Dysphagia 2 (Fine chop) solids;Thin liquid Liquid Administration via Cup;Straw Medication Administration Whole meds with puree Compensations Slow rate;Small sips/bites;Follow solids with liquid;Multiple dry swallows after each bite/sip Postural Changes Seated upright at 90 degrees     03/12/2022   2:53 PM Other Recommendations Oral Care Recommendations Oral care BID   02/19/2022   2:01 PM Frequency and Duration  Speech Therapy Frequency (ACUTE ONLY) min 2x/week Treatment Duration 2 weeks     03/12/2022   2:48 PM Oral Phase Oral Phase Impaired Oral - Nectar Teaspoon NT Oral - Nectar Cup Reduced posterior propulsion;Lingual/palatal residue Oral - Nectar Straw NT Oral - Thin Teaspoon Reduced posterior propulsion;Lingual/palatal residue Oral - Thin Cup Reduced posterior propulsion;Lingual/palatal residue Oral - Thin Straw Reduced posterior propulsion;Lingual/palatal residue Oral - Puree Reduced posterior propulsion;Lingual/palatal residue Oral - Mech Soft Reduced posterior propulsion;Lingual/palatal residue;Impaired mastication Oral - Regular NT Oral - Multi-Consistency Reduced posterior propulsion;Lingual/palatal residue;Premature spillage;Decreased bolus cohesion;Impaired mastication Oral - Pill NT    03/12/2022   2:49 PM Pharyngeal Phase Pharyngeal Phase Impaired Pharyngeal- Nectar Teaspoon NT Pharyngeal- Nectar Cup Reduced tongue base retraction;Pharyngeal residue - pyriform;Pharyngeal residue -  valleculae;Reduced pharyngeal peristalsis Pharyngeal Material does not enter airway Pharyngeal- Nectar Straw NT Pharyngeal- Thin Teaspoon Reduced tongue base retraction;Pharyngeal residue - valleculae;Pharyngeal residue - pyriform;Reduced pharyngeal peristalsis Pharyngeal Material does not enter airway Pharyngeal- Thin Cup Reduced tongue base retraction;Pharyngeal residue - valleculae;Pharyngeal residue - pyriform;Reduced pharyngeal peristalsis Pharyngeal Material does not enter airway Pharyngeal- Thin Straw Reduced tongue base retraction;Pharyngeal residue - pyriform;Pharyngeal residue - valleculae;Lateral channel residue;Reduced pharyngeal peristalsis Pharyngeal Material does not enter airway Pharyngeal- Puree Reduced tongue base retraction;Pharyngeal residue - valleculae;Pharyngeal residue - pyriform;Reduced pharyngeal peristalsis Pharyngeal Material does not enter airway Pharyngeal- Mechanical Soft Reduced pharyngeal peristalsis;Reduced tongue base retraction;Pharyngeal residue - valleculae Pharyngeal- Regular NT Pharyngeal- Multi-consistency Reduced tongue base retraction;Pharyngeal residue - valleculae;Delayed swallow initiation-pyriform sinuses;Reduced pharyngeal peristalsis Pharyngeal Material does not enter airway Pharyngeal- Pill NT    03/12/2022   2:53 PM Cervical Esophageal Phase  Cervical Esophageal Phase [facility] Helaine Chess, MA, CCC-SLP Bethany A Lutes 03/12/2022, 3:08 PM                     ?Recent Labs  ?  03/12/22 ?2376  ?WBC 7.4  ?HGB 7.8*  ?HCT 25.3*  ?PLT 274  ? ? ? ?Recent Labs  ?  03/12/22 ?2831 03/13/22 ?5176  ?NA 137 137  ?K 5.3* 5.2*  ?CL 106 102  ?CO2 24 24  ?GLUCOSE 168* 90  ?BUN 54* 54*  ?CREATININE 2.06* 2.18*  ?CALCIUM 9.1 9.1  ? ? ? ? ?Intake/Output Summary (Last 24 hours) at 03/13/2022 0908 ?Last data filed at 03/13/2022 0000 ?Gross per 24 hour  ?Intake 2390 ml  ?Output 200 ml  ?Net 2190 ml  ? ?  ? ?  ? ?Physical  Exam: ?Vital Signs ?Blood pressure (!) 152/78, pulse (!) 56, temperature 98 ?F  (36.7 ?C), temperature source Oral, resp. rate 16, height '5\' 9"'$  (1.753 m), weight 85.1 kg, SpO2 99 %. ? ? ?General: No acute distress.  + NG. ?Mood and affect are appropriate ?Heart: Regular rate.  + [place]

## 2022-03-13 NOTE — Progress Notes (Addendum)
Nutrition Follow-up ? ?DOCUMENTATION CODES:  ? ?Not applicable ? ?INTERVENTION:  ?48 hour calorie count initiated. ? ?Provide Ensure Enlive po TID, each supplement provides 350 kcal and 20 grams of protein. ? ?Encourage adequate PO intake.  ? ?NUTRITION DIAGNOSIS:  ? ?Inadequate oral intake related to inability to eat as evidenced by NPO status; diet advanced; progressing ? ?GOAL:  ? ?Patient will meet greater than or equal to 90% of their needs; progressing ? ?MONITOR:  ? ?PO intake, Supplement acceptance, Diet advancement, Labs, Weight trends, Skin, I & O's ? ?REASON FOR ASSESSMENT:  ? ?Consult ?Enteral/tube feeding initiation and management ? ?ASSESSMENT:  ? ?76 year old right-handed male with history of chronic anemia, diabetes mellitus, hyperlipidemia, hypertension. Presented 02/10/2022 with left-sided weakness and incoordination as well as nausea vomiting. 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 Pt with functional deficits secondary to intraparenchymal hemorrhage admitted to CIR. ? ?Diet has been advanced to a dysphagia 2 diet with thin liquids. Meal completion today has been poor at 5-25% with only 5% at lunch today. Pt reports taste bud changes and reports food at meals have not tasted good, however has been enjoying his ice cream and Ensure supplements. [name] NGT remains in place. 48 hour calorie count has been initiated. RD to follow up tomorrow for day 1 calorie count results. Pt encouraged to eat his food at meals and to drink his supplements.  ? ?Labs and medications reviewed.  ? ?Diet Order:   ?Diet Order   ? ?       ?  DIET DYS 2 Room service appropriate? Yes; Fluid consistency: Thin  Diet effective now       ?  ? ?  ?  ? ?  ? ? ?EDUCATION NEEDS:  ? ?Not appropriate for education at this time ? ?Skin:  Skin Assessment: Reviewed RN  Assessment ?Skin Integrity Issues:: Incisions ?Incisions: head ? ?Last BM:  4/16 ? ?Height:  ? ?Ht Readings from Last 1 Encounters:  ?02/26/22 '5\' 9"'$  (1.753 m)  ? ? ?Weight:  ? ?Wt Readings from Last 1 Encounters:  ?03/07/22 85.1 kg  ? ?BMI:  Body mass index is 27.71 kg/m?. ? ?Estimated Nutritional Needs:  ? ?Kcal:  2100-2300 ? ?Protein:  105-120 grams ? ?Fluid:  >/= 2 L/day ? ?Corrin Parker, MS, RD, LDN ?RD pager number/after hours weekend pager number on Amion. ? ?

## 2022-03-13 NOTE — Progress Notes (Signed)
Occupational Therapy Session Note ? ?Patient Details  ?Name: Vernon [name] ?MRN: [id number] ?Date of Birth: 02/17/1946 ? ?Today's Date: 03/13/2022 ?OT Individual Time: 1045-1143 ?OT Individual Time Calculation (min): 58 min  ? ? ?Short Term Goals: ?Week 2:  OT Short Term Goal 1 (Week 2): Pt will complete toilet transfer with max A and LRAD. ?OT Short Term Goal 2 (Week 2): Pt will complete sit to stand with min A in prep for standing ADL. ?OT Short Term Goal 3 (Week 2): Pt will complete > 2 grooming task at sink with S. ?OT Short Term Goal 4 (Week 2): Pt will utilize external aids to with no more than mod cuing to orient self to location. ? ?Skilled Therapeutic Interventions/Progress Updates:  ?  Pt scheduled for OT at 1030 but he had 4 visitors in the room! Agreed to come back in 15 min so he could have a chance to visit. ? ?Upon return, pt agreeable to do a shower today.  For safety, told pt I would be most comfortable with  squat pivot transfers vs having him ambulate into bathroom. Pt agreed this was safest. He did a squat pivot to his L recliner to wc min A.  Then to shower bench to R min.   Had pt hold grab bar with L hand and stand, he then used R hand to push pants down over his hips.  Doffed pants over feet then shirt.  Requested pt sit for full shower so he did need min A to wash bottom, otherwise he was supervision sitting on bench.  Sit pivot back to wc with bars for A.  Pt sat in w/c to rest. He was feeling quite fatigued.  Rested then able to continue dressing. Used grab bar by toilet for support in standing to help pull pants over hips using R hand to get pants over hips 70% of the way. I donned his ted hose and then pt able to don both socks and shoes crossing his legs. He had difficulty tying shoe laces.   ?Pt then transferred back to recliner.   ?Pt worked on [facility] skills of fastening buttons, lacing string through loops.   ?Pt resting  in recliner with all needs met and belt alarm on.   ? ? ? ?Therapy  Documentation ?Precautions:  ?Precautions ?Precautions: Fall ?Precaution Comments: [name], L weakness, L coordination deficit ?Restrictions ?Weight Bearing Restrictions: No ? ?Pain: no c/o pain ?  ?ADL: ?ADL ?Eating: Supervision/safety ?Grooming: Supervision/safety ?Where Assessed-Grooming: Sitting at sink ?Upper Body Bathing: Supervision/safety ?Where Assessed-Upper Body Bathing: Shower ?Lower Body Bathing: Minimal assistance ?Where Assessed-Lower Body Bathing: Shower ?Upper Body Dressing: Supervision/safety ?Where Assessed-Upper Body Dressing: Wheelchair ?Lower Body Dressing: Minimal assistance ?Where Assessed-Lower Body Dressing: Wheelchair ?Toileting: Maximal assistance ?Where Assessed-Toileting: Other (Comment) (sink) ?Toilet Transfer: Moderate assistance ?Toilet Transfer Method: Other (comment) (stedy) ?Tub/Shower Transfer: Not assessed ?Walk-In Shower Transfer: Minimal assistance ?Walk-In Shower Transfer Method: Squat pivot ?Walk-In Shower Equipment: Grab bars, Transfer tub bench ? ? ?Therapy/Group: Individual Therapy ? ?, ?03/13/2022, 11:51 AM ?

## 2022-03-13 NOTE — Progress Notes (Signed)
Speech Language Pathology Weekly Progress and Session Note ? ?Patient Details  ?Name: Vernon [name] ?MRN: [id number] ?Date of Birth: [birth year] ? ?Beginning of progress report period: March 06, 2022 ?End of progress report period: March 13, 2022 ? ?Today's Date: 03/13/2022 ?SLP Individual Time: 0800-0900 ?SLP Individual Time Calculation (min): 60 min ? ?Short Term Goals: ?Week 2: SLP Short Term Goal 1 (Week 2): Pt will participate instrumental swallow assessment to determine appropriateness for PO diet ?SLP Short Term Goal 1 - Progress (Week 2): Met ?SLP Short Term Goal 2 (Week 2): Pt will increase orientation to biographical info, place, time and recent events provided min A multimodal cues ?SLP Short Term Goal 2 - Progress (Week 2): Met ?SLP Short Term Goal 3 (Week 2): Pt will increase speech intelligibility at the conversation level to >90% with sup A verbal cues ?SLP Short Term Goal 3 - Progress (Week 2): Met ?SLP Short Term Goal 4 (Week 2): Pt will complete basic problem solving with min A cues ?SLP Short Term Goal 4 - Progress (Week 2): Met ?SLP Short Term Goal 5 (Week 2): Pt will increase sustained attention to functional tasks to 15 minutes provided min A cues ?SLP Short Term Goal 5 - Progress (Week 2): Met ?SLP Short Term Goal 6 (Week 2): Pt will follow 2-step directions during functional tasks with 80% accuracy with min cues ?SLP Short Term Goal 6 - Progress (Week 2): Met ? ?New Short Term Goals: ?Week 3: SLP Short Term Goal 1 (Week 3): STG=LTG due to ELOS ? ?Weekly Progress Updates: Patient continues to make excellent gains and has met 6 of 6 STGs this reporting period. Patient is currently completing basic level cognitive tasks with overall min A verbal cues; mild-to-moderately complex cognitive tasks with mod A verbal/visual cues. Pt requires sup A verbal cues to orient to time; demonstrates clear awareness of current situation and emergent awareness of deficits with min A verbal cues. Pt continues to  exhibit decreased awareness of cognitive and swallowing deficits, however acknowledges he is not yet back to baseline. Patient demonstrates substantial progress with oropharyngeal swallow function as evidenced by MBS obtained on 4/17 with recommendations to initiate oral diet consisteting of dysphagia 2 diet with thin liquids given implementation of aspiration precautions to include slow rate, small + single bites/sips, upright positioning, awake/alert, and use of double swallow.; crushed crushed meds vs. whole in puree. There was no evidence of penetration nor aspiration with any of the consistencies assessed under fluoro. May upgrade solids textures, clinically, when dentures are present ?Pt is currently consuming meals with full supervision for safety for implementation of safe swallowing precautions and strategies with sup A verbal cues, and cleared to consume liquids at bedside while ensuring patient is fully upright for all PO consumption. SLP continues to address orientation, problem solving, memory, attention, and swallowing goals. Pt met goals for speech intelligibility and is perceived as 100% intelligible at the conversation level with independence; no further evidence of dysarthria at this time. Patient and family education is ongoing. Patient would benefit from continued skilled SLP intervention to maximize cognitive and swallow functioning and overall functional independence prior to discharge.   ?  ?Intensity: Minumum of 1-2 x/day, 30 to 90 minutes ?Frequency: 3 to 5 out of 7 days ?Duration/Length of Stay: 4/25 ?Treatment/Interventions: Cognitive remediation/compensation;Environmental controls;Internal/external aids;Therapeutic Exercise;Therapeutic Activities;Patient/family education;Functional tasks;Dysphagia/aspiration precaution training;Cueing hierarchy ? ?Daily Session ?Skilled Therapeutic Interventions: Skilled ST treatment focused on swallowing and cognitive goals. Patient with nurse on arrival  for  toileting needs. Nurse stated pt consumed whole pills in applesauce well when taken one at a time. [name] still in place although disconnected during session. Pt politely declined breakfast tray due to poor appetite; stated he typically does not eat first thing in the morning. Pt agreeable to consume sips of orange juice by straw and coffee by cup without overt s/sx of aspiration and sup A verbal cues to implement second swallow. Pt consumed small and controlled sips without evidence of impulsivity with intake. Pt exhibited occasional delayed throat clear; per MBS, throat clearing was felt to be benign. Allow patient to consume liquids at bedside. Pt recalled results of swallow study as well as verbalized safe swallowing precautions with sup A verbal cues. Pt delighted to be eating and drinking again. SLP facilitated session by providing sup A verbal cues for use of external aid to orient to date (dow, date); pt was independently oriented to person, place ,month/yr, and situation. SLP facilitated following 2-step directions with 100% accuracy at independent level. SLP also facilitated mild-to-moderately complex problem solving and mental calculations with overall mod A verbal/visual cues. Patient was left in recliner chair with alarm activated and immediate needs within reach at end of session. Continue per current plan of care.    ? ?General  ?  ?Pain ? None/denied ? ?Therapy/Group: Individual Therapy ? ?Mellisa Arshad T Khale Nigh ?03/13/2022, 1:00 PM ? ? ? ? ? ? ?

## 2022-03-13 NOTE — Progress Notes (Signed)
Occupational Therapy Session Note ? ?Patient Details  ?Name: Vernon [name] ?MRN: [id number] ?Date of Birth: 09/08/1946 ? ?Today's Date: 03/13/2022 ?OT Individual Time: 7356-7014 ?OT Individual Time Calculation (min): 60 min  ? ? ?Short Term Goals: ?Week 1:  OT Short Term Goal 1 (Week 1): Pt will bathe UB seated at sink with min A. ?OT Short Term Goal 1 - Progress (Week 1): Met ?OT Short Term Goal 2 (Week 1): Pt will complete > 2 grooming task at sink with S. ?OT Short Term Goal 2 - Progress (Week 1): Not met ?OT Short Term Goal 3 (Week 1): Pt will complete sit to stand with min A in prep for standing ADL. ?OT Short Term Goal 3 - Progress (Week 1): Not met ?OT Short Term Goal 4 (Week 1): Pt will complete toilet transfer with max A and LRAD. ?OT Short Term Goal 4 - Progress (Week 1): Not met ?Week 2:  OT Short Term Goal 1 (Week 2): Pt will complete toilet transfer with max A and LRAD. ?OT Short Term Goal 2 (Week 2): Pt will complete sit to stand with min A in prep for standing ADL. ?OT Short Term Goal 3 (Week 2): Pt will complete > 2 grooming task at sink with S. ?OT Short Term Goal 4 (Week 2): Pt will utilize external aids to with no more than mod cuing to orient self to location. ? ?Skilled Therapeutic Interventions/Progress Updates:  ?  Patient seated in recliner upon entry, pt indicated that he was tired, but willing to participate.  The pt completed sit to stand using the [facility] with MinA for transferring from sit to stand.  The pt returned to sit and simulated a task in bathing UB/LB with s/u and vc's, he was instructed to incorporate relaxation breathing with demonstration prior to execution.  The pt also simulated UB/LB dressing using resistive band with vc's for UB for how he approached the task, donning the hemi extremity first and doffing the item off with the extremity with full range first.  The pt was able to demonstrate effective carryover with MinA for LB function.  The pt was able to doff and donn his  socks and shoes with s/u and additional time by crossing his legs over his knee. The pt complete UB exercise using a 2lb dowel in various plane to improve his AROM for bilateral hand activity.  The pt was able to manipulated clothes pins and place them on a line drawn on paper located on a clip board.  The pt was instructed on finger strengthening exercises using paper, and removing objects from theraputty while demonstrating in hand manipulation to enhance the fluency of his movements bilaterally.  The pt remained in the recliner with his bedside table and call light within reach, his alarm was activated.  The pt had no c/o pain during this treatment session, however, he was seen by the MD with the discussion of removal of the feeding tube sometime today.  All additional pt needs were addressed . ? ?Therapy Documentation ?Precautions:  ?Precautions ?Precautions: Fall ?Precaution Comments: [name], L weakness, L coordination deficit ?Restrictions ?Weight Bearing Restrictions: No ? ?Therapy/Group: Individual Therapy ? ?Yvonne Kendall ?03/13/2022, 10:39 AM ?

## 2022-03-13 NOTE — Progress Notes (Signed)
Physical Therapy Session Note ? ?Patient Details  ?Name: Vernon [name] ?MRN: [id number] ?Date of Birth: Jul 08, 1946 ? ?Today's Date: 03/13/2022 ?PT Individual Time: 4765-4650 ?PT Individual Time Calculation (min): 19 min  ? ?Short Term Goals: ?Week 1:  PT Short Term Goals - Week 1 ?PT Short Term Goal 1 (Week 1): Pt will perform bed mobility with consistent MinA. ?PT Short Term Goal 1 - Progress (Week 1): Met ?PT Short Term Goal 2 (Week 1): Pt will perform sit<>stand transfers with consistent ModA. ?PT Short Term Goal 2 - Progress (Week 1): Met ?PT Short Term Goal 3 (Week 1): Pt will perform stand pivot transfers with consistent ModA +1. ?PT Short Term Goal 3 - Progress (Week 1): Progressing toward goal ?PT Short Term Goal 4 (Week 1): Pt will initiate gait training. ?PT Short Term Goal 4 - Progress (Week 1): Met ?PT Short Term Goals - Week 2 ?PT Short Term Goal 1 (Week 2): Pt will perform bed mobility with consistent CGA. ?PT Short Term Goal 2 (Week 2): Pt will perform sit<>stand transfers with consistent MinA. ?PT Short Term Goal 3 (Week 2): Pt will perform stand pivot transfers with consistent ModA +1. ?PT Short Term Goal 4 (Week 2): Pt will ambulate 43f using LRAD with consistent MinA. ? ?Skilled Therapeutic Interventions/Progress Updates:  ?Patient supine in bed on entrance to room. Patient alert and agreeable to PT session. Relates being in bed to wait for IV team but agreeable to amb in hallway to wait for team to arrive. ? ?Patient with no pain complaint throughout session. Just relates readiness for [name] to be removed. ? ?Nursing arrives upon amb to leave room and relates IV team came once to try to insert line but refused until pt supine in bed and left. Nursing requesting pt stay in bed until IV returns. Amb performed out into hallway and back into room prior to returning to bed.  ? ?Therapeutic Activity: ?Bed Mobility: Patient performed supine --> sit with increased effort and CGA/ MinA to reach seated  position on EOB. LUE demos ataxic movements into straight arm shoulder flexion. VC/ tc required for technique and slowing movements for decreased ataxic movements. Return to supine at end of session with close supervision. ?Transfers: Patient performed sit<>stand transfers during session with Min/ ModA d/t trunkal ataxia upon rise into stance. Provided vc/ tc for technique, with slow, safe movements and for hand positioning. Stand pivot at end of amb bout with impulsive reach for bed rail and discard of walker. Cues to slow and control descent to sit with pt unable to follow instructions.  ? ?Gait Training:  ?Patient ambulated 55 ft with 3# aw to L ankle and using RW with up to [place] balance and some int LLE ataxic movements. Provided vc/ tc for slowing steps, increasing L step length during turn to R and upright posture with abdominal breathing to reduce external distractions/ anxiety. ? ?Patient supine  in bed at end of session with brakes locked, bed alarm set, and all needs within reach. ? ? ?Therapy Documentation ?Precautions:  ?Precautions ?Precautions: Fall ?Precaution Comments: [name], L weakness, L coordination deficit ?Restrictions ?Weight Bearing Restrictions: No ?General: ?PT Missed Treatment Reason: Nursing care (IV team requesting pt be SUPINE IN BED for placement and [name] removal) ?Vital Signs: ?Therapy Vitals ?Temp: 98.2 ?F (36.8 ?C) ?Temp Source: Oral ?Pulse Rate: 63 ?Resp: 16 ?BP: 120/67 ?Patient Position (if appropriate): Lying ?Oxygen Therapy ?SpO2: 95 % ?O2 Device: Room Air ?Pain: ?Pain Assessment ?Pain Scale:  0-10 ?Pain Score: 0-No pain ? ?Therapy/Group: Individual Therapy ? ?Vernon [name] PT, DPT ?03/13/2022, 5:48 PM  ?

## 2022-03-13 NOTE — Progress Notes (Signed)
[facility] ([facility])  Hospital Liaison: RN note         This patient has been referred to our palliative care services in the community.  [facility] will continue to follow for any discharge planning needs and to coordinate continuation of palliative care in the outpatient setting.    If you have questions or need assistance, please call [phone] or contact the hospital Liaison listed on AMION.      Thank you for this referral.         Mary Anne Robertson, RN, CCM  [facility] Hospital Liaison   [phone] 

## 2022-03-13 NOTE — Progress Notes (Signed)
Pts cortrak removed with no complications. Pt able to eat over 50% of dinner with no complications.  ?

## 2022-03-14 LAB — GLUCOSE, CAPILLARY
Glucose-Capillary: 89 mg/dL (ref 70–99)
Glucose-Capillary: 129 mg/dL — ABNORMAL HIGH (ref 70–99)
Glucose-Capillary: 201 mg/dL — ABNORMAL HIGH (ref 70–99)
Glucose-Capillary: 111 mg/dL — ABNORMAL HIGH (ref 70–99)

## 2022-03-14 MED ORDER — ENSURE ENLIVE PO LIQD
237.0000 mL | Freq: Four times a day (QID) | ORAL | Status: DC
  Administered 2022-03-14 – 2022-03-15 (×3): 237 mL via ORAL

## 2022-03-14 MED ORDER — INSULIN GLARGINE-YFGN 100 UNIT/ML ~~LOC~~ SOLN
10.0000 [IU] | Freq: Every day | SUBCUTANEOUS | Status: DC
  Administered 2022-03-15 – 2022-03-20 (×6): 10 [IU] via SUBCUTANEOUS
  Filled 2022-03-14 (×6): qty 0.1

## 2022-03-14 MED ORDER — MEGESTROL ACETATE 400 MG/10ML PO SUSP
400.0000 mg | Freq: Two times a day (BID) | ORAL | Status: DC
  Administered 2022-03-14 – 2022-03-19 (×11): 400 mg via ORAL
  Filled 2022-03-14 (×11): qty 10

## 2022-03-14 MED ORDER — INSULIN ASPART 100 UNIT/ML IJ SOLN
0.0000 [IU] | Freq: Three times a day (TID) | INTRAMUSCULAR | Status: DC
  Administered 2022-03-14: 2 [IU] via SUBCUTANEOUS
  Administered 2022-03-14: 5 [IU] via SUBCUTANEOUS
  Administered 2022-03-15: 3 [IU] via SUBCUTANEOUS
  Administered 2022-03-15 – 2022-03-16 (×5): 2 [IU] via SUBCUTANEOUS
  Administered 2022-03-16 – 2022-03-17 (×2): 3 [IU] via SUBCUTANEOUS
  Administered 2022-03-17: 2 [IU] via SUBCUTANEOUS
  Administered 2022-03-17: 3 [IU] via SUBCUTANEOUS
  Administered 2022-03-18: 2 [IU] via SUBCUTANEOUS
  Administered 2022-03-18 (×3): 3 [IU] via SUBCUTANEOUS
  Administered 2022-03-19: 5 [IU] via SUBCUTANEOUS
  Administered 2022-03-19 (×3): 3 [IU] via SUBCUTANEOUS
  Administered 2022-03-20: 2 [IU] via SUBCUTANEOUS

## 2022-03-14 MED ORDER — PROSOURCE PLUS PO LIQD
30.0000 mL | Freq: Two times a day (BID) | ORAL | Status: DC
  Administered 2022-03-14 – 2022-03-19 (×9): 30 mL via ORAL
  Filled 2022-03-14 (×5): qty 30

## 2022-03-14 NOTE — Progress Notes (Signed)
Semglee insulin not in Pyxis. Called pharmacy to verify that insulin was sent with medication delivery. States insulin was delivered. Per Pt he received insulin this am. Semglee insulin not documented in [facility].  ?

## 2022-03-14 NOTE — Progress Notes (Signed)
Speech Language Pathology Daily Session Note ? ?Patient Details  ?Name: Vernon [name] ?MRN: [id number] ?Date of Birth: Jul 01, 1946 ? ?Today's Date: 03/14/2022 ?SLP Individual Time: 1303-1400 ?SLP Individual Time Calculation (min): 57 min ? ?Short Term Goals: ?Week 3: SLP Short Term Goal 1 (Week 3): STG=LTG due to ELOS ? ?Skilled Therapeutic Interventions: Skilled ST treatment focused on cognitive-communication and swallowing goals. [name] was removed yesterday; pt very pleased by this. Patient was accompanied by daughter and grandchildren this date. Pt was oriented to person/place/situation independent of cues, and sup A verbal cues for day of the week. Pt recalled prior events from therapy today in addition to recalling names of therapist's with sup A cues for use of external aid with daily therapy schedule. Pt complained of bland food choices as well as nausea which have limited intake since initiation of PO diet. MD/RN/RD are aware and addressing. Pt agreeable to therapeutic PO trials. Upper and lower denture were placed prior to consumption. Of note, upper denture fitting slightly looser than usual per pt report. Pt has not worn dentures since CVA and possibly loose fitting due to weight loss. May benefit from denture adhesive, however current fit did not appear to negatively impact mastication abilities. Pt exhibited effective mastication, what appeared to be mildly delayed oral transit, and complete oral clearance post swallow. There were no s/s of aspiration observed with Dys 3 solids or straw sips of thin liquids. Pt required sup A verbal cues to implement second swallow. Recommend Dys 3 trial tray prior to consideration of diet advancement. Pt verbalized understanding and agreement. Patient was transferred to wheelchair using [place] with sup A verbal cues for safety and taken outside in [facility] for remainder of session while accompanied by daughter and grandchildren. SLP facilitated verbal discussion on ADL/iADL  sequencing with sup A verbal cues, and anticipation of needs at discharge with overall min A verbal cues. Pt returned to room and left in wheelchair with alarm activated and immediate needs within reach at end of session. Continue per current plan of care.   ?   ?Pain ?Pain Assessment ?Pain Scale: 0-10 ?Pain Score: 0-No pain ? ?Therapy/Group: Individual Therapy ? ?Jadien Lehigh T Shariece Viveiros ?03/14/2022, 2:19 PM ?

## 2022-03-14 NOTE — Patient Care Conference (Signed)
Inpatient RehabilitationTeam Conference and Plan of Care Update ?Date: 03/14/2022   Time: 10:18 AM  ? ? ?Patient Name: Vernon [name]      ?Medical Record Number: [id number]  ?Date of Birth: 09-06-1946 ?Sex: Male         ?Room/Bed: 4B09G/2E36O-29 ?Payor Info: Payor: [employer] / Plan: [facility] MEDICARE / Product Type: *No Product type* /   ? ?Admit Date/Time:  02/26/2022  5:04 PM ? ?Primary Diagnosis:  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 hemorrhage of brain (HCC) ? ?Hospital Problems: Principal Problem: ?  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 hemorrhage of brain ([place]) ?Active Problems: ?  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  Stage 3b chronic kidney disease ([place]) ?  Dysphagia, post-stroke ? ? ? ?Expected Discharge Date: Expected Discharge Date: 03/20/22 ? ?Team Members Present: ?Physician leading conference: Dr. Alysia Penna ?Social Worker Present: Erlene Quan, BSW ?Nurse Present: Dorien Chihuahua, RN ?PT Present: Barrie Folk, PT ?OT Present: Providence Lanius, OT ?SLP Present: Sherren Kerns, SLP ? ?   Current Status/Progress Goal Weekly Team Focus  ?Bowel/Bladder ? ? Continent bowel/bladder. Voiding clear yellow urine per urinal without difficulty. Last BM 03/13/2022.  maintain bowel/bladder continence  monitor B/B outputs   ?Swallow/Nutrition/ Hydration ? ? MBSS completed on 4/17 - pt cleared for thin liquids and Dysphagia 2 textures  Sup A for least restrictive diet  Tolerance of current diet textures and implementation of double swallow   ?ADL's ? ? min A squat pivot transfers to shower. close S in shower seated  with min for bottom.  s/u UB self care, min LB self care - improving [place] on L.  S to min A, 24/7 S  LUE NMR, cognition, ADL training, balance, pt/fam education   ?Mobility ? ? Continued Improving cognition, NG tube removed and diet progressed with pt's positivity improving; Bed mobility = Min A; Improved sitting balance; Sit <>stand MinA to perform and up to  [place] for balance, squat pivot = Min A; Amb >50'' with ankle weight to L ankle and RW and light ModA for balance; continued impulsivity noted when fatiguing during mobility  Bed mobility with CGA, transfers with CGA, ambulation with CGA/ MinA  L hemibody NMR, standing balance, transfer and gait training, FAMILY EDUCATION   ?Communication ? ? Goal Met  Goal Met  N/A   ?Safety/Cognition/ Behavioral Observations ? min-to-mod A  sup A  problem solving, attention, recall   ?Pain ? ? no c/o pain, No noted distress.  Miantain comfort level.  Monitor pain/comfort routinely.   ?Skin ? ?    Maintain current skin integrity.  Assess skin routinely qs, & prn.   ? ? ?Discharge Planning:  ?Patient discharging home with spouse with PT/OT/SLP/Aide to eval and treat. [facility] resources provided to spouse.   ?Team Discussion: ?Patient is making great progress; post cerebellar CVA. Function o ?verall limited by truncal ataxia, poor proprioception with good carryover;.ble to remember precautions.  Continue to note nausea early am; poor po intake, needs encouragement to eat D2 thin diet once nausea has subsided.  ? ?Patient on target to meet rehab goals: ?yes, currently needs min assist for stand pivot transfers. Requires min - mod assist for lower body care and toileting but only set up for upper body. Min- mod assist for cognition. Min assist for transfers and min - mod assist for gait.  ? ?*See Care Plan and progress notes for long and short-term goals.  ? ?Revisions to Treatment Plan:  ?N/A  ?Teaching Needs: ?  Safety, transfers, toileting,medications, etc. ?  ?Current Barriers to Discharge: ?Decreased caregiver support and home environment barriers ? ?Possible Resolutions to Barriers: ?Family education ?24/7 supervision ?OP/HH follow up services ?Ramp for entry to home ?  ? ? Medical Summary ?Current Status: now taking po safely but appetite poor, still impulsive ? Barriers to Discharge: Medical stability;Other (comments) ?  ?Possible  Resolutions to [employer]: appetite , d/c am[place] , trial megace ? ? ?Continued Need for Acute Rehabilitation Level of Care: The patient requires daily medical management by a physician with specialized training in physical medicine and rehabilitation for the following reasons: ?Direction of a multidisciplinary physical rehabilitation program to maximize functional independence : Yes ?Medical management of patient stability for increased activity during participation in an intensive rehabilitation regime.: Yes ?Analysis of laboratory values and/or radiology reports with any subsequent need for medication adjustment and/or medical intervention. : Yes ? ? ?I attest that I was present, lead the team conference, and concur with the assessment and plan of the team. ? ? ?[name] ?03/14/2022, 2:45 PM  ? ? ? ? ? ? ?

## 2022-03-14 NOTE — Progress Notes (Signed)
Patient ID: Vernon [name], male   DOB: 10/30/1946, 76 y.o.   MRN: [id number] ?Team Conference Report to Patient/Family ? ?Team Conference discussion was reviewed with the patient and caregiver, including goals, any changes in plan of care and target discharge date.  Patient and caregiver express understanding and are in agreement.  The patient has a target discharge date of 03/20/22. ? ?Sw spoke with patient spouse and provided an updated d/c date for patient. SW informed spouse that [facility] has been established. Sw looking for [facility] preferences, spouse will call sw back with information. ? ?Vernon [name] ?03/14/2022, 3:13 PM  ?

## 2022-03-14 NOTE — Progress Notes (Signed)
Occupational Therapy Session Note ? ?Patient Details  ?Name: Vernon [name] ?MRN: [id number] ?Date of Birth: 05-Sep-1946 ? ?Today's Date: 03/14/2022 ?OT Individual Time: 5597-4163 ?OT Individual Time Calculation (min): 60 min  ? ? ?Short Term Goals: ?Week 1:  OT Short Term Goal 1 (Week 1): Pt will bathe UB seated at sink with min A. ?OT Short Term Goal 1 - Progress (Week 1): Met ?OT Short Term Goal 2 (Week 1): Pt will complete > 2 grooming task at sink with S. ?OT Short Term Goal 2 - Progress (Week 1): Not met ?OT Short Term Goal 3 (Week 1): Pt will complete sit to stand with min A in prep for standing ADL. ?OT Short Term Goal 3 - Progress (Week 1): Not met ?OT Short Term Goal 4 (Week 1): Pt will complete toilet transfer with max A and LRAD. ?OT Short Term Goal 4 - Progress (Week 1): Not met ?Week 2:  OT Short Term Goal 1 (Week 2): Pt will complete toilet transfer with max A and LRAD. ?OT Short Term Goal 2 (Week 2): Pt will complete sit to stand with min A in prep for standing ADL. ?OT Short Term Goal 3 (Week 2): Pt will complete > 2 grooming task at sink with S. ?OT Short Term Goal 4 (Week 2): Pt will utilize external aids to with no more than mod cuing to orient self to location. ? ?Skilled Therapeutic Interventions/Progress Updates:  ?  Pt received asleep in bed but easily awakened to voice, no c/o, agreeable to therapy. Session focus on self-care retraining, activity tolerance, transfer retraining in prep for improved ADL/IADL/func mobility performance + decreased caregiver burden. Came to sitting EOB with S and use of bed rail, episode of dry heaving, reports that he is nauseous every time he wakes up. Required extended time to complete ADL due to fatigue and nausea.  ? ?Set-up A for UBD. Heavy min A for standing balance during LBD, required assist with zipper. Close S for donning and tying shoes EOB. Ambulatory toilet transfer with mod A due to L sided ataxia and poor RW management, one LOB requiring max A to  correct. ? ?Mod A for managing clothing over hips post continent void of bladder. Stand-pivot > w/c on his R with min A.  ? ?Participated in the following to target LUE NMR/ataxia: ? -placing and retrieving resistive clothes pins on vertical bar, performance improved with 1 lb wrist weight and cues to slow down ? -practicing tossing/batching ball with BUE and hitting with 1 lb dowel rod ? ?Pt left seated in recliner with safety belt alarm engaged, call bell in reach, and all immediate needs met.  ? ? ?Therapy Documentation ?Precautions:  ?Precautions ?Precautions: Fall ?Precaution Comments: [name], L weakness, L coordination deficit ?Restrictions ?Weight Bearing Restrictions: No ? ?Pain: ?  No c/o ?ADL: See Care Tool for more details. ? ? ?Therapy/Group: Individual Therapy ? ?Volanda Napoleon MS, OTR/L ? ?03/14/2022, 6:59 AM ?

## 2022-03-14 NOTE — Progress Notes (Addendum)
Calorie Count Note ? ?48 hour calorie count ordered. ? ?Diet: Dysphagia 2 diet with thin liquids.  ?Supplements: Ensure Enlive po TID, each supplement provides 350 kcal and 20 grams of protein. ? ?Breakfast: 145 kcal, 4 grams of protein ?Lunch: 290 kcal, 9 grams of protein ?Dinner: 299 kcal, 10 grams of protein ?Supplements: 700 kcal, 40 grams of protein. ? ?Day 1 Total intake: ?1434 kcal (68% of kcal needs)  ?63 grams of protein (60% of protein needs) ? ?Estimated Nutritional Needs:  ?Kcal:  2100-2300 ?Protein:  105-120 grams ?Fluid:  >/= 2 L/day ? ?[name] NGT removed yesterday evening. Pt reports continued taste changes and has only been consuming 5-25% at meals. Pt currently has Ensure ordered and has been consuming them. RD to increase Ensure orders and additionally order Prosource plus to aid in caloric and protein needs. Pt encouraged to eat his food at meals and to drink his supplements.  ? ?Nutrition Dx:  ?Inadequate oral intake related to inability to eat as evidenced by NPO status; diet advanced; progressing ? ?Goal:  ?Patient will meet greater than or equal to 90% of their needs; not met ? ?Intervention:  ?Continue calorie count. ?Provide Ensure Enlive po QID, each supplement provides 350 kcal and 20 grams of protein ?Provide 30 ml Prosource plus po BID, each supplement provides 100 kcal and 15 grams of protein.  ? ? , MS, RD, LDN ?RD pager number/after hours weekend pager number on Amion. ? ? ?

## 2022-03-14 NOTE — Progress Notes (Signed)
Physical Therapy Weekly Progress Note ? ?Patient Details  ?Name: Vernon [name] ?MRN: [id number] ?Date of Birth: September 17, 1946 ? ?Beginning of progress report period: March 06, 2022 ?End of progress report period: March 14, 2022 ? ?Today's Date: 03/14/2022 ?PT Individual Time: 2951-8841 ?PT Individual Time Calculation (min): 53 min  ? ?Patient has met 4 of 4 short term goals.  Pt is making steady progress towards LTG of CGA overall. Currnently requiring min-supervisoian assist for stand pivot transfer, min-mod assist gait with RW due to LLE/LUE/trunkal ataxia, but moderately improved from eval. Family education planned to begin this week with d/c set for 4/25.  ? ?Patient continues to demonstrate the following deficits muscle weakness, impaired timing and sequencing, ataxia, and decreased coordination, decreased attention to left, central origin, and decreased sitting balance, decreased standing balance, decreased postural control, hemiplegia, and decreased balance strategies and therefore will continue to benefit from skilled PT intervention to increase functional independence with mobility. ? ?Patient progressing toward long term goals..  Continue plan of care. ? ?PT Short Term Goals ?Week 2:  PT Short Term Goal 1 (Week 2): Pt will perform bed mobility with consistent CGA. ?PT Short Term Goal 1 - Progress (Week 2): Met ?PT Short Term Goal 2 (Week 2): Pt will perform sit<>stand transfers with consistent MinA. ?PT Short Term Goal 2 - Progress (Week 2): Met ?PT Short Term Goal 3 (Week 2): Pt will perform stand pivot transfers with consistent ModA +1. ?PT Short Term Goal 3 - Progress (Week 2): Met ?PT Short Term Goal 4 (Week 2): Pt will ambulate 50f using LRAD with consistent MinA. ?PT Short Term Goal 4 - Progress (Week 2): Met ?Week 3:  PT Short Term Goal 1 (Week 3): STG=LTG due to ELOS ? ?Skilled Therapeutic Interventions/Progress Updates:  ? ?Pt received sitting in recliner and agreeable to PT. Pt performed squat pivot  transfer with UE support on WC arm rest and supervision assist from PT for safety.  ? ?WC mobility through hall x 1251fwith min assist with cues for improved attention to the LUE to modulate force on the LUE.  ? ?Gait training x 10065fith min assist from PT with moderate cues for attention to the LLE to prevent adduction with turns to the R. PT applied 10lb weight to RW to  improve coordination of RW with trunkal and LUE ataxia.  ?Dynamic gait training forward/reverse with min-mod assist 2 x 36f62fh with cues for sequencing of RW management as improved step width on the LLE to reduce lateral L LOB. Side stepping in parallel bars 2 x 8ft 69f with visual feedback from mirror and min assist from PT with cues for step width and decreased trunkal compensations. Stepping 1 foot in each space with aility ladder forward and 2 feet for reverse 2 x 8ft  67fBed mobility training on the R side of bed to simulate access at home with supervision assist and cues for attention to the LLE.  ? ?Kinetron reciprocal movement training 2 x 2 min with therapeutic rest break between bouts with cues for symmetry of movement R and L. ? ?Patient returned to room and performed stand pivot to recliner with supervision assist and UE supported on chair armrests . Pt left sitting in recliner with call bell in reach and all needs met.   ? ? ?   ? ?Therapy Documentation ?Precautions:  ?Precautions ?Precautions: Fall ?Precaution Comments: [name], L weakness, L coordination deficit ?Restrictions ?Weight Bearing Restrictions: No ? ? ?Pain: ?Pain Assessment ?  Pain Scale: 0-10 ?Pain Score: 0-No pain ? ?Therapy/Group: Individual Therapy ? ?Lorie Phenix ?03/14/2022, 11:47 AM  ?

## 2022-03-14 NOTE — Progress Notes (Signed)
Physical Therapy Session Note ? ?Patient Details  ?Name: Vernon [name] ?MRN: [id number] ?Date of Birth: 08/31/1946 ? ?Today's Date: 03/14/2022 ?PT Individual Time: 1410-1451 ?PT Individual Time Calculation (min): 41 min  ? ?Short Term Goals: ?Week 3:  PT Short Term Goal 1 (Week 3): STG=LTG due to ELOS ? ?Skilled Therapeutic Interventions/Progress Updates: Pt presented in w/c agreeable to therapy. Pt denies pain during session. Session focused on gait and block practice step pivot transfers with emphasis on RW safety. Pt transported to day room for energy conservation with pt participating in ambulation with RW with minA including x 2 turns. Pt ambulated 50ft x 2 with 2.5lb weight in LLE and 10# weight on RW. The first bout requiring modA during L turn due to LLE scissoring and poor safety with RW. As pt turning to w/c pt noted to leave RW at 90 degree angle to w/c while reaching back for w/c. On second bout pt more cognizant of LLE during turn with improved safety but continued to demonstrate decreased safety awareness while returning to w/c. Focus then switched to block practice of step pivot transfers between standard chair with arm rests and w/c. Pt was able to perform turns with minA but required max multimodal cues initially for RW management and to reach back to chair. Pt did note to improve with repetition and was able to perform successfully on last bout with minimal cues. Pt then propelled back to room for general conditioning with BUE with intermittent cues for increased propulsion with LUE as would tend to drift to wall. In room pt performed step pivot transfer to recliner in same manner as performed prior with pt requiring cues but demonstrated fair control and fair safety with RW. Pt left in recliner at end of session with belt alarm on, call bell with family members and current needs met.  ?   ? ?Therapy Documentation ?Precautions:  ?Precautions ?Precautions: Fall ?Precaution Comments: [name], L  weakness, L coordination deficit ?Restrictions ?Weight Bearing Restrictions: No ?General: ?  ?Vital Signs: ?Therapy Vitals ?Temp: 98.3 ?F (36.8 ?C) ?Temp Source: Oral ?Pulse Rate: 67 ?Resp: 17 ?BP: (!) 99/54 ?Patient Position (if appropriate): Sitting ?Oxygen Therapy ?SpO2: 98 % ?O2 Device: Room Air ?Pain: ?Pain Assessment ?Pain Scale: 0-10 ?Pain Score: 0-No pain ?Mobility: ?  ?Locomotion : ?   ?Trunk/Postural Assessment : ?   ?Balance: ?  ?Exercises: ?  ?Other Treatments:   ? ? ? ?Therapy/Group: Individual Therapy ? ?  ?03/14/2022, 2:53 PM  ?

## 2022-03-14 NOTE — Plan of Care (Signed)
?  Problem: RH Expression Communication ?Goal: LTG Patient will increase speech intelligibility (SLP) ?Description: LTG: Patient will increase speech intelligibility at word/phrase/conversation level with cues, % of the time (SLP) ?Outcome: Completed/Met ?  ?Problem: RH Problem Solving ?Goal: LTG Patient will demonstrate problem solving for (SLP) ?Description: LTG:  Patient will demonstrate problem solving for basic/complex daily situations with cues  (SLP) ?Flowsheets (Taken 03/14/2022 1024) ?LTG Patient will demonstrate problem solving for: ? Minimal Assistance - Patient > 75% ? Supervision ?Note: Goald downgraded due to slower than anticipated progress ?  ?

## 2022-03-15 LAB — GLUCOSE, CAPILLARY
Glucose-Capillary: 129 mg/dL — ABNORMAL HIGH (ref 70–99)
Glucose-Capillary: 162 mg/dL — ABNORMAL HIGH (ref 70–99)
Glucose-Capillary: 124 mg/dL — ABNORMAL HIGH (ref 70–99)
Glucose-Capillary: 150 mg/dL — ABNORMAL HIGH (ref 70–99)

## 2022-03-15 LAB — BASIC METABOLIC PANEL
Anion gap: 7 (ref 5–15)
BUN: 60 mg/dL — ABNORMAL HIGH (ref 8–23)
CO2: 24 mmol/L (ref 22–32)
Calcium: 9.3 mg/dL (ref 8.9–10.3)
Chloride: 104 mmol/L (ref 98–111)
Creatinine, Ser: 2.68 mg/dL — ABNORMAL HIGH (ref 0.61–1.24)
GFR, Estimated: 24 mL/min — ABNORMAL LOW (ref >60)
Glucose, Bld: 207 mg/dL — ABNORMAL HIGH (ref 70–99)
Potassium: 5.1 mmol/L (ref 3.5–5.1)
Sodium: 135 mmol/L (ref 135–145)

## 2022-03-15 MED ORDER — ENSURE ENLIVE PO LIQD
237.0000 mL | Freq: Two times a day (BID) | ORAL | Status: DC
  Administered 2022-03-16 – 2022-03-19 (×8): 237 mL via ORAL

## 2022-03-15 NOTE — Progress Notes (Signed)
Physical Therapy Session Note ? ?Patient Details  ?Name: Vernon [name] ?MRN: [id number] ?Date of Birth: 11/04/1946 ? ?Today's Date: 03/15/2022 ?PT Individual Time: 2641-5830 ?PT Individual Time Calculation (min): 40 min  ? ?Short Term Goals: ?Week 3:  PT Short Term Goal 1 (Week 3): STG=LTG due to ELOS ? ?Skilled Therapeutic Interventions/Progress Updates:  ?  Pt received supported upright in bed and agreeable to therapy session. Pt proudly reports he ate all of his breakfast. Kept Ginger Ale and blue emesis bag with pt during session as he reports getting "nauseous" with movement first thing in AM until "gag reflex" passes.  ? ?Supine>sitting L EOB, HOB slightly elevated and using bedrial, with supervision. Sitting EOB with supervision, donned tennis shoes total assist for time management. MD in/out for morning assessment. Therapist had pt recall what he learned during PTA session yesterday on safe AD management when performing stand pivot transfers prior to initiating one this AM.  R stand pivot transfer EOB>w/c using 10lb weighted RW with heavy  min assist for balance - pt does well with turning AD fully and bringing it back with him prior to going to sit with min cuing - continues to have quick, ataxic movements.  ? ?Transported to/from gym in w/c for time management and energy conservation. ? ?Gait training ~22f (through 2 doorways) using 10lb weighted RW with 2.5lb weight on L LE due to ataxia with the following assistance and gait deviations with cuing: ? - min/mod assist for balance and safety ?- tends to excessively externally rotate L LE during swing advancement - cued for hip abduction while avoiding external rotation ?- L anterior lean, cuing throughout for upright trunk posture ?- pt tends to veer L running therapist into items requiring cuing for awareness of this ?Reviewed these impairments with pt during seated rest break to improve carryover into next gait trial. ? ?Removed central 10lb weight from RW  and put 2x 4lb weights on either leg of RW to more evenly distribute the weight. Removed ankle weight from L LE. ? ?Performed same gait rout of ~865fas before using 8lb weighted RW with more consistent heavy min assist this time - repeated verbal cuing as above with pt demonstrating good carryover.  Repeated same gait distance again; however, transitioned to performing L turns through doorways this time - more heavy min assist this time due to more consistent L lean - cuing to maintain trunk upright. Continues to do well with L LE limb advancement without the ankle weight.  ? ?Transported back towards room in w/c. Gait training ~7036fsing 8lb weighted RW as just described with goal of carrying over gait improvements in different environment.  ? ?At end of session, pt left seated in recliner with needs in reach, seat belt alarm on, B LEs elevated, and lab tech present for blood draw. ? ? ?Therapy Documentation ?Precautions:  ?Precautions ?Precautions: Fall ?Precaution Comments: [name], L weakness, L coordination deficit ?Restrictions ?Weight Bearing Restrictions: No ? ?Pain: ? Denies pain during session. But does report "tired muscles" - provided seated rest breaks as needed throughout session. ? ? ? ?Therapy/Group: Individual Therapy ? ?CarTawana ScalePT, DPT, NCS, CSRS ?03/15/2022, 7:52 AM  ?

## 2022-03-15 NOTE — Progress Notes (Signed)
Calorie Count Note ? ?48 hour calorie count ordered. ? ?Diet: Dysphagia 2 diet with thin liquids.  ?Supplements:  ?Ensure Enlive po QID, each supplement provides 350 kcal and 20 grams of protein. ?30 ml Prosource plus po BID, each supplement provides 100 kcal and 15 grams of protein.  ? ?Breakfast: 357 kcal, 21 grams of protein ?Lunch: 476 kcal, 18 grams of protein ?Dinner: 145 kcal, 6 grams of protein ?Supplements: 900 kcal, 70 grams of protein. ? ?Day 2 Total intake: ?1878 kcal (89% of kcal needs)  ?115 grams of protein (100% of protein needs) ? ?Estimated Nutritional Needs:  ?Kcal:  2100-2300 ?Protein:  105-120 grams ?Fluid:  >/= 2 L/day ? ?Diet has been advanced to a dysphagia 3 diet with thin liquids. Meal completion recently has been 85-100%. Pt reports taste and appetite has improved. As po intake has improved, RD to modify nutritional supplement orders to better meet nutrition needs. Pt encouraged to eat his food at meals and to drink his supplements.  ? ?Nutrition Dx:  ?Inadequate oral intake related to inability to eat as evidenced by NPO status; diet advanced; progressing ? ?Goal:  ?Patient will meet greater than or equal to 90% of their needs; met ? ?Intervention:  ?Discontinue calorie count. ?Provide Ensure Enlive po BID, each supplement provides 350 kcal and 20 grams of protein ?Provide 30 ml Prosource plus po BID, each supplement provides 100 kcal and 15 grams of protein.  ? ?Corrin Parker, MS, RD, LDN ?RD pager number/after hours weekend pager number on Amion. ? ? ?

## 2022-03-15 NOTE — Progress Notes (Signed)
?                                                       PROGRESS NOTE ? ? ?Subjective/Complaints: ?Ate well for breakfast , participating with PT  ?ROS- denies CP, SOB , N/V/D ? ? ?Objective: ?  ?No results found. ?No results for input(s): WBC, HGB, HCT, PLT in the last 72 hours. ? ? ?Recent Labs  ?  03/13/22 ?5701  ?NA 137  ?K 5.2*  ?CL 102  ?CO2 24  ?GLUCOSE 90  ?BUN 54*  ?CREATININE 2.18*  ?CALCIUM 9.1  ? ? ? ? ?Intake/Output Summary (Last 24 hours) at 03/15/2022 0939 ?Last data filed at 03/15/2022 0900 ?Gross per 24 hour  ?Intake 940 ml  ?Output 800 ml  ?Net 140 ml  ? ?  ? ?  ? ?Physical Exam: ?Vital Signs ?Blood pressure (!) 96/41, pulse (!) 56, temperature 98.2 ?F (36.8 ?C), resp. rate 18, height '5\' 9"'$  (1.753 m), weight 85.1 kg, SpO2 100 %. ? ? ?General: No acute distress.   ?Mood and affect are appropriate ?Heart: Regular rate.  + Murmur. ?Lungs: Clear to auscultation, breathing unlabored, no rales or wheezes ?Abdomen: Positive bowel sounds, soft nontender to palpation, nondistended ?Extremities: No clubbing, cyanosis, or edema ?Skin: No evidence of breakdown, no evidence of rash ?Neurologic: Alert ?Motor: 5/5 in bilateral deltoid, bicep, tricep, grip, hip flexor, knee extensors, ankle dorsiflexor and plantar flexor ?Cerebellar exam LUE ataxia, improving ?Musculoskeletal: Full range of motion in all 4 extremities. No joint swelling ? ?Assessment/Plan: ?1. Functional deficits which require 3+ hours per day of interdisciplinary therapy in a comprehensive inpatient rehab setting. ?Physiatrist is providing close team supervision and 24 hour management of active medical problems listed below. ?Physiatrist and rehab team continue to assess barriers to discharge/monitor patient progress toward functional and medical goals ? ?Care Tool: ? ?Bathing ? Bathing activity did not occur: Safety/medical concerns ?Body parts bathed by patient: Right arm, Left arm, Chest, Abdomen, Face, Front perineal area, Right upper leg,  Left upper leg, Right lower leg, Left lower leg  ? Body parts bathed by helper: Buttocks ?  ?  ?Bathing assist Assist Level: Minimal Assistance - Patient > 75% ?  ?  ?Upper Body Dressing/Undressing ?Upper body dressing Upper body dressing/undressing activity did not occur (including orthotics): Safety/medical concerns ?What is the patient wearing?: Pull over shirt ?   ?Upper body assist Assist Level: Supervision/Verbal cueing ?   ?Lower Body Dressing/Undressing ?Lower body dressing ? ? ? Lower body dressing activity did not occur: Safety/medical concerns ?What is the patient wearing?: Incontinence brief, Pants ? ?  ? ?Lower body assist Assist for lower body dressing: Minimal Assistance - Patient > 75% ?   ? ?Toileting ?Toileting    ?Toileting assist Assist for toileting: Moderate Assistance - Patient 50 - 74% (stedy) ?  ?  ?Transfers ?Chair/bed transfer ? ?Transfers assist ? Chair/bed transfer activity did not occur: Safety/medical concerns ? ?Chair/bed transfer assist level: Moderate Assistance - Patient 50 - 74% ?  ?  ?Locomotion ?Ambulation ? ? ?Ambulation assist ? ? Ambulation activity did not occur: Safety/medical concerns ? ?  ?  ?   ? ?Walk 10 feet activity ? ? ?Assist ? Walk 10 feet activity did not occur: Safety/medical concerns ? ?  ?   ? ?Walk 50  feet activity ? ? ?Assist Walk 50 feet with 2 turns activity did not occur: Safety/medical concerns ? ?  ?   ? ? ?Walk 150 feet activity ? ? ?Assist Walk 150 feet activity did not occur: Safety/medical concerns ? ?  ?  ?  ? ?Walk 10 feet on uneven surface  ?activity ? ? ?Assist Walk 10 feet on uneven surfaces activity did not occur: Safety/medical concerns ? ? ?  ?   ? ?Wheelchair ? ? ? ? ?Assist Is the patient using a wheelchair?: Yes ?Type of Wheelchair: Manual ?Wheelchair activity did not occur: Safety/medical concerns ? ?  ?   ? ? ?Wheelchair 50 feet with 2 turns activity ? ? ? ?Assist ? ?  ?Wheelchair 50 feet with 2 turns activity did not occur:  Safety/medical concerns ? ? ?   ? ?Wheelchair 150 feet activity  ? ? ? ?Assist ? Wheelchair 150 feet activity did not occur: Safety/medical concerns ? ? ?   ? ?Blood pressure (!) 96/41, pulse (!) 56, temperature 98.2 ?F (36.8 ?C), resp. rate 18, height '5\' 9"'$  (1.753 m), weight 85.1 kg, SpO2 100 %. ? ?Medical Problem List and Plan: ?1. 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 Continue CIR, 4/25 d/c date ?2.  Antithrombotics: ?-DVT/anticoagulation:  Pharmaceutical: Heparin initiated 02/12/2022 ?            -antiplatelet therapy: N/A ?3. Pain Management: Tylenol as needed ?4. Decreased arousal as well as agitation improved :  ?Improved d/c amantadine, d/c zyprexa   ? ?5. Neuropsych: This patient is capable of making medical decisions on his own behalf. ?Will ask Neuropsych to help delineate his cognitive issues and capacity ?SLP has noted improvement in cognition as well ? TeleSitter for safety ?6. Skin/Wound Care: Routine skin checks ?7. Fluids/Electrolytes/Nutrition: Routine in and outs ?8.  Postop large right-sided pleural effusion.  Status postthoracentesis with 1250 cc yield.  Latest chest x-ray unremarkable ?9.  Seizure prophylaxis.  Keppra 500 mg twice daily completed ?10.  Atrial fibrillation.  Eliquis currently on hold.  Neurology considering resuming in approximately 4 weeks.  Cardiac rate controlled ?11.  Diabetes mellitus with hyperglycemia.  Hemoglobin A1c 6.2.  CBG (last 3)  ?Recent Labs  ?  03/14/22 ?1647 03/14/22 ?2238 03/15/22 ?0541  ?GLUCAP 201* 111* 129*  ? ?Semglee to 20U  ? Controlled 4/20 ?12.  Chronic anemia.   ? Hemoglobin 7.9 on 4/12, relatively stable, labs ordered for Monday ?13  Hypertension.  Continue Coreg 3.125 mg twice daily.  Monitor with increased mobility ?          ?Vitals:  ? 03/14/22 1939 03/15/22  0518  ?BP: 111/72 (!) 96/41  ?Pulse: 64 (!) 56  ?Resp: 16 18  ?Temp: 98.4 ?F (36.9 ?C) 98.2 ?F (36.8 ?C)  ?SpO2: 95% 100%  ? Running low with bradycardia , d/c coreg  ?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  Currently NPO.  Nasogastric tube feeds.  Dietary and speech therapy follow-up ?            Repeat MBS per SLP, pt can make decision whether he wishes to proceed ?  ?15. UTI with <100,000 E Coli. Keflex started. Sens to Keflex finished 7-day treatment ? 16.  Anemia with normal plt and WBCs, counts are stable 4/17 ? ?  Latest Ref Rng & Units 03/12/2022  ?  5:13 AM 03/07/2022  ?  9:59 AM 02/27/2022  ?  5:20 AM  ?CBC  ?WBC 4.0 - 10.5 K/uL 7.4   7.8   10.5    ?Hemoglobin 13.0 - 17.0 g/dL 7.8   7.9   8.0    ?Hematocrit 39.0 - 52.0 % 25.3   25.5   24.7    ?Platelets 150 - 400 K/uL 274   235   342    ?17.  CKD stage III ? Creatinine 1.69 on 4/4, up to 2.09, increase free H20, no nephro toxic meds, will give IVF bolus  ? ?  Latest Ref Rng & Units 03/13/2022  ?  5:25 AM 03/12/2022  ?  5:13 AM 02/27/2022  ?  5:20 AM  ?BMP  ?Glucose 70 - 99 mg/dL 90   168   175    ?BUN 8 - 23 mg/dL 54   54   41    ?Creatinine 0.61 - 1.24 mg/dL 2.18   2.06   1.69    ?Sodium 135 - 145 mmol/L 137   137   139    ?Potassium 3.5 - 5.1 mmol/L 5.2   5.3   4.9    ?Chloride 98 - 111 mmol/L 102   106   106    ?CO2 22 - 32 mmol/L '24   24   27    '$ ?Calcium 8.9 - 10.3 mg/dL 9.1   9.1   9.0    ? ?Recheck today , if worsening may need to consult nephro  ?LOS: ?17 days ?A FACE TO FACE EVALUATION WAS PERFORMED ? ?Luanna Salk Scotlyn Mccranie ?03/15/2022, 9:39 AM  ? ? ? ?

## 2022-03-15 NOTE — Progress Notes (Signed)
Physical Therapy Session Note ? ?Patient Details  ?Name: Vernon [name] ?MRN: [id number] ?Date of Birth: Apr 16, 1946 ? ?Today's Date: 03/15/2022 ?PT Individual Time: 3202-3343 ?PT Individual Time Calculation (min): 53 min  ? ?Short Term Goals: ?Week 1:  PT Short Term Goal 1 (Week 1): Pt will perform bed mobility with consistent MinA. ?PT Short Term Goal 1 - Progress (Week 1): Met ?PT Short Term Goal 2 (Week 1): Pt will perform sit<>stand transfers with consistent ModA. ?PT Short Term Goal 2 - Progress (Week 1): Met ?PT Short Term Goal 3 (Week 1): Pt will perform stand pivot transfers with consistent ModA +1. ?PT Short Term Goal 3 - Progress (Week 1): Progressing toward goal ?PT Short Term Goal 4 (Week 1): Pt will initiate gait training. ?PT Short Term Goal 4 - Progress (Week 1): Met ?Week 2:  PT Short Term Goal 1 (Week 2): Pt will perform bed mobility with consistent CGA. ?PT Short Term Goal 1 - Progress (Week 2): Met ?PT Short Term Goal 2 (Week 2): Pt will perform sit<>stand transfers with consistent MinA. ?PT Short Term Goal 2 - Progress (Week 2): Met ?PT Short Term Goal 3 (Week 2): Pt will perform stand pivot transfers with consistent ModA +1. ?PT Short Term Goal 3 - Progress (Week 2): Met ?PT Short Term Goal 4 (Week 2): Pt will ambulate 57f using LRAD with consistent MinA. ?PT Short Term Goal 4 - Progress (Week 2): Met ?Week 3:  PT Short Term Goal 1 (Week 3): STG=LTG due to ELOS ? ?Skilled Therapeutic Interventions/Progress Updates:  ? ?Pt received sitting in WC and agreeable to PT. WC mobility with supervision assist x 1547fwith cues for decreased force of UE movement on the LUE to protrect hand.  ? ?Gait training on unlevel cement sidewalk 2 x 16056fith min-mod assist cues for improved posture, step width on the LLE, and safety in turns as well as decreased speed.  ? ?Stnadign NMR stepping forward x 12. Latteral stepping x 12 sit<>stand x 8 with UE supported on RW throughout for safety and min assist with cues  for posture and ROM  ? ?Patient returned to room and performed stand pivot to recliner with RW and min assist for safety . Pt left sitting in recliner with call bell in reach and all needs met.   ? ? ?Therapy Documentation ?Precautions:  ?Precautions ?Precautions: Fall ?Precaution Comments: [name], L weakness, L coordination deficit ?Restrictions ?Weight Bearing Restrictions: No ? ?Pain: ?denies ? ? ? ?Therapy/Group: Individual Therapy ? ?AusLorie Phenix/20/2023, 6:14 PM  ?

## 2022-03-15 NOTE — Progress Notes (Signed)
Speech Language Pathology Daily Session Note ? ?Patient Details  ?Name: Vernon [name] ?MRN: [id number] ?Date of Birth: 02/27/1946 ? ?Today's Date: 03/15/2022 ?SLP Individual Time: 1200-1240 and 1330-1350 ?SLP Individual Time Calculation (min): 40 min and 20 minutes ? ?Short Term Goals: ?Week 3: SLP Short Term Goal 1 (Week 3): STG=LTG due to ELOS ? ?Skilled Therapeutic Interventions: ?Session 1: Pt seen for dysphagia 3 trial tray during lunch. Pt performed self feeding following set-up A. Dentures were in place prior to arrival. Pt consumed average size bites with functional mastication, no oral residue post-swallow, swift swallow response, and no s/s of aspiration. Recommend advancing to dysphagia 3 diet, thin liquids; meds whole in puree; intermittent supervision during meals. Pt verbalized understanding and agreement with plan. SLP notified RN/NT regarding diet advancement. All signs in room and diet order updated to reflect changes. Pt was left in recliner with alarm activated and immediate needs within reach at end of session. Continue per current plan of care.   ?   ?Session 2: Skilled ST treatment focused on cognitive goals. SLP facilitated basic-mildly complex problem solving, error awareness, recall and sustained attention with novel card task "Blink". Pt completed task with overall sup A verbal cues for error awareness and repair. Pt's processing efficiency appeared to improve as task progressed with improved recall in task instructions. Patient was left in recliner with alarm activated and immediate needs within reach at end of session. Continue per current plan of care.   ? ?Pain ?Pain Assessment ?Pain Scale: 0-10 ?Pain Score: 0-No pain ? ?Therapy/Group: Individual Therapy ? ?Ruchi Stoney T Cloyce Blankenhorn ?03/15/2022, 1:55 PM ?

## 2022-03-15 NOTE — Progress Notes (Signed)
Occupational Therapy Weekly Progress Note ? ?Patient Details  ?Name: Vernon [name] ?MRN: [id number] ?Date of Birth: [birth year] ? ?Beginning of progress report period: March 06, 2022 ?End of progress report period: March 15, 2022 ? ?Today's Date: 03/15/2022 ?OT Individual Time: 9675-9163 ?OT Individual Time Calculation (min): 53 min  ? ? ?Patient has met 4 of 4 short term goals.  Pt has made excellent progress this week towards LTG. Pt has demonstrated improved alertness, cognition, STM, awareness of deficits, dynamic standing balance, activity tolerance to presently complete seated UB ADL at S level, LBD at min A level, and ambulatory bathroom transfers with min to mod A with RW/weighted with 10 lbs.  Pt cont to be primarily limited by trunkal and LUE/LLE ataxia. Anticipate 24/7 S and min physical assist required upon DC. ? ? ?Patient continues to demonstrate the following deficits: muscle weakness, decreased cardiorespiratoy endurance, impaired timing and sequencing, unbalanced muscle activation, ataxia, decreased coordination, and decreased motor planning, decreased attention to left and decreased motor planning, decreased awareness, decreased problem solving, decreased safety awareness, and decreased memory, and decreased standing balance, decreased postural control, hemiplegia, and decreased balance strategies and therefore will continue to benefit from skilled OT intervention to enhance overall performance with BADL and Reduce care partner burden. ? ?Patient progressing toward long term goals..  Continue plan of care. ? ?OT Short Term Goals ?Week 1:  OT Short Term Goal 1 (Week 1): Pt will bathe UB seated at sink with min A. ?OT Short Term Goal 1 - Progress (Week 1): Met ?OT Short Term Goal 2 (Week 1): Pt will complete > 2 grooming task at sink with S. ?OT Short Term Goal 2 - Progress (Week 1): Not met ?OT Short Term Goal 3 (Week 1): Pt will complete sit to stand with min A in prep for standing ADL. ?OT Short Term  Goal 3 - Progress (Week 1): Not met ?OT Short Term Goal 4 (Week 1): Pt will complete toilet transfer with max A and LRAD. ?OT Short Term Goal 4 - Progress (Week 1): Not met ?Week 2:  OT Short Term Goal 1 (Week 2): Pt will complete toilet transfer with max A and LRAD. ?OT Short Term Goal 1 - Progress (Week 2): Met ?OT Short Term Goal 2 (Week 2): Pt will complete sit to stand with min A in prep for standing ADL. ?OT Short Term Goal 2 - Progress (Week 2): Met ?OT Short Term Goal 3 (Week 2): Pt will complete > 2 grooming task at sink with S. ?OT Short Term Goal 3 - Progress (Week 2): Met ?OT Short Term Goal 4 (Week 2): Pt will utilize external aids to with no more than mod cuing to orient self to location. ?OT Short Term Goal 4 - Progress (Week 2): Met ? ?Skilled Therapeutic Interventions/Progress Updates:  ?  Pt received seated in recliner, no c/o pain, agreeable to therapy. Session focus on self-care retraining, activity tolerance, transfer retraining, LUE NMR in prep for improved ADL/IADL/func mobility performance + decreased caregiver burden. Short ambulatory transfers throughout session with RW with 10 lb weight + heavy min A to manage RW and L lean, cues for LLE placement due to ataxia/inattention.  ? ?Stood to play 4 rounds of corn hole with overall heavy min A for balance with RUE on RW, able to reach across midline and to his L with LUE to toss bags. Scored 10 holes. Improved performance with cues to slow down as pt moving quickly/impulsively.  ? ?In [facility]  walking sling, pt participated in massed practice of tossing ball back and forth with use of 2 lb dowel rod from various distances. No LOB but pt reports increase in back pain and activity ceased. ? ?Pt able to write in his own memory book and recall 2 OT activities, did initially think today was 4/19 instead of 4/20. ? ?Pt left seated in recliner with safety belt alarm engaged, NT present, call bell in reach, and all immediate needs met.  ? ? ?Therapy  Documentation ?Precautions:  ?Precautions ?Precautions: Fall ?Precaution Comments: [name], L weakness, L coordination deficit ?Restrictions ?Weight Bearing Restrictions: No ? ?Pain: see session note ?  ?ADL: See Care Tool for more details. ? ? ? ?Therapy/Group: Individual Therapy ? ?Volanda Napoleon MS, OTR/L ? ?03/15/2022, 6:50 AM  ?

## 2022-03-16 ENCOUNTER — Inpatient Hospital Stay (HOSPITAL_COMMUNITY): Payer: Medicare Other

## 2022-03-16 LAB — GLUCOSE, CAPILLARY
Glucose-Capillary: 124 mg/dL — ABNORMAL HIGH (ref 70–99)
Glucose-Capillary: 112 mg/dL — ABNORMAL HIGH (ref 70–99)
Glucose-Capillary: 166 mg/dL — ABNORMAL HIGH (ref 70–99)
Glucose-Capillary: 124 mg/dL — ABNORMAL HIGH (ref 70–99)

## 2022-03-16 LAB — SODIUM, URINE, RANDOM: Sodium, Ur: 38 mmol/L

## 2022-03-16 LAB — URINALYSIS, ROUTINE W REFLEX MICROSCOPIC
Bilirubin Urine: NEGATIVE
Glucose, UA: NEGATIVE mg/dL
Hgb urine dipstick: NEGATIVE
Ketones, ur: NEGATIVE mg/dL
Nitrite: POSITIVE — AB
Protein, ur: NEGATIVE mg/dL
Specific Gravity, Urine: 1.011 (ref 1.005–1.030)
WBC, UA: >50 WBC/hpf — ABNORMAL HIGH (ref 0–5)
pH: 6 (ref 5.0–8.0)

## 2022-03-16 LAB — CREATININE, URINE, RANDOM: Creatinine, Urine: 59.65 mg/dL

## 2022-03-16 LAB — PROTEIN / CREATININE RATIO, URINE
Creatinine, Urine: 59.95 mg/dL
Protein Creatinine Ratio: 0.22 mg/mg{Cre} — ABNORMAL HIGH (ref 0.00–0.15)
Total Protein, Urine: 13 mg/dL

## 2022-03-16 MED ORDER — SODIUM CHLORIDE 0.45 % IV BOLUS
500.0000 mL | Freq: Once | INTRAVENOUS | Status: AC
  Administered 2022-03-16: 500 mL via INTRAVENOUS

## 2022-03-16 MED ORDER — CEPHALEXIN 250 MG PO CAPS
250.0000 mg | ORAL_CAPSULE | Freq: Two times a day (BID) | ORAL | Status: DC
  Administered 2022-03-16 – 2022-03-18 (×4): 250 mg via ORAL
  Filled 2022-03-16 (×4): qty 1

## 2022-03-16 NOTE — Progress Notes (Signed)
Speech Language Pathology Daily Session Note ? ?Patient Details  ?Name: Vernon [name] ?MRN: [id number] ?Date of Birth: [birth year] ? ?Today's Date: 03/16/2022 ?SLP Individual Time: 1100-1200 ?SLP Individual Time Calculation (min): 60 min ? ?Short Term Goals: ?Week 3: SLP Short Term Goal 1 (Week 3): STG=LTG due to ELOS ? ?Skilled Therapeutic Interventions: Skilled ST treatment focused on cognitive goals. SLP facilitated session by providing min A verbal cues for awareness, as he was asked to provide insight on what he has been working on in therapy and to summarize understanding of rehab needs. Pt exhibited decent emergent awareness of physical impairments however limited intellectual awareness of cognitive and swallowing deficits; unable to recall why he was NPO (stated "it's because the food tasted bad"); tends to deny cognitive and memory difficulty despite expressing "I don't remember that" throughout discussion. SLP facilitated safety awareness through identifying safe vs. unsafe considerations in written scenario with min A verbal cues, and anticipated needs with min-to-mod A verbal cues. Patient verbally sequenced steps for safe transfer and ambulation using walker with min A fading to sup A verbal cues. SLP facilitated sustained attention, reasoning, and mental flexibility through generating various words given specific set of letters. Pt completed task for 15 minute duration with sup A cues for mental flexibility and strategy. Patient was left in recliner with alarm activated and immediate needs within reach at end of session. Continue per current plan of care.   ?   ? ?Pain ?Pain Assessment ?Pain Scale: 0-10 ?Pain Score: 0-No pain ? ?Therapy/Group: Individual Therapy ? ?Deroy Noah T Parnika Tweten ?03/16/2022, 12:46 PM ?

## 2022-03-16 NOTE — Progress Notes (Signed)
Patient ID: Vernon [name], male   DOB: [birth year], 76 y.o.   MRN: [id number] ? ?Family education scheduled 4/24 1-4 ?

## 2022-03-16 NOTE — Consult Note (Addendum)
Reason for Consult:AKI  ?Referring Physician: Dr. Phillips Odor  ? ?Chief Complaint: AKI ? ?Assessment/Plan: ?AKI on CKD 3a ?Cr 2.68 <2.18 <2.06. Baseline appears to be ~1.3-1.42. GFR 24 ?UA with nitrite positive, large leuks, >50WBC, many bacteria  ?UOP 1.950 L yesterday. Received 500cc 0.45 NS bolus today  ?Unclear cause of kidney injury, patient appears euvolemic on exam, without any edema, JVD, or other signs of volume overload.  He has been eating and drinking at baseline for the past couple of weeks. He has been urinating well with no obstructive symptoms.  Although UA indicating infection, he is asymptomatic without dysuria, urgency, frequency or hematuria.  No feeling of abdominal fullness after urinating.  No recent Foley. I also do not see any nephrotoxic agents like NSAID's or contrast.  ?He had an episode of AKI earlier in the hospitalization prior to transfer to [facility] that resolved with his cr peaking at 2.03 before slowly returning to the 1.3-1.45 range. ? ?- We will check renal ultrasound, post void bladder scan, check urine lytes, repeat UA and obtain urine culture.  ?- Wonder if his "soft" bp's may be causing decreased renal perfusion; agree with discontinuing the bblr. ? ?-Monitor Daily I/Os, Daily weight  ?-Maintain MAP>65 for optimal renal perfusion.  ?-Avoid nephrotoxic medications including NSAIDs, contrast if possible ? ? 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 Follows with [facility] ?A fib- Eliquis on hold. Will follow up with Neurology outpatient  ?Moderate aortic valve stenosis  ?DM2- A1c 6.2. On Semglee 10u daily and mSSI ?HLD- Simvastatin '40mg'$  daily ?GERD- Protonix '40mg'$  daily ?HTN- BP soft. Currently 102/60. 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 monitored with CBCs  ?  ?HPI: Vernon [name] is an 76 y.o. 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 ? ?Doctor told him that he was monitoring his kidneys but was not worried. Denies any concerns. He had not had an appetite for awhile but now is back to normal, says that he eats everything. Appetite started to improve the 2nd week of this hospitalization. Drinks a lot of water. At home, sometimes he did not eat because he experienced nausea but now has been much better since. While home there were some days he did not drink as much but then started to get thirsty and regularly increased his oral water intake to the amount it is now. Denies dysuria, hematuria or issues with urinating. He does not feel like he is urinating less than he normally does. Denies abdominal pain. BM are regular, no constipation.  ? ? ?ROS ?Pertinent items are noted in HPI. ? ?Chemistry and CBC: ?Creatinine, Ser  ?Date/Time Value Ref Range Status  ?03/15/2022 10:02 AM 2.68 (H) 0.61 - 1.24 mg/dL Final  ?03/13/2022 05:25 AM 2.18 (H) 0.61 - 1.24 mg/dL Final  ?03/12/2022 05:13 AM 2.06 (H) 0.61 - 1.24 mg/dL Final  ?02/27/2022 05:20 AM 1.69 (H) 0.61 - 1.24 mg/dL Final  ?02/26/2022 02:36 AM 1.53 (H) 0.61 - 1.24 mg/dL Final  ?02/25/2022 02:33 AM 1.55 (H) 0.61 - 1.24 mg/dL Final  ?02/24/2022 01:54 AM 1.42 (H) 0.61 - 1.24 mg/dL Final  ?02/23/2022 02:03 AM 1.34 (H) 0.61 - 1.24 mg/dL  Final  ?02/22/2022 04:42 AM 1.39 (H) 0.61 - 1.24 mg/dL Final  ?02/21/2022 01:17 AM 1.35 (H) 0.61 - 1.24 mg/dL Final  ?02/20/2022 03:48 AM 1.36 (H) 0.61 - 1.24 mg/dL Final  ?02/19/2022 03:41 AM 1.34 (H) 0.61 - 1.24 mg/dL Final  ?02/18/2022 11:43 AM 1.23 0.61 - 1.24 mg/dL Final  ?02/17/2022 03:31 AM 1.47 (H) 0.61 - 1.24 mg/dL Final  ?02/16/2022  02:23 AM 1.61 (H) 0.61 - 1.24 mg/dL Final  ?02/15/2022 03:40 PM 1.66 (H) 0.61 - 1.24 mg/dL Final  ?02/15/2022 05:14 AM 1.77 (H) 0.61 - 1.24 mg/dL Final  ?02/14/2022 04:50 AM 1.99 (H) 0.61 - 1.24 mg/dL Final  ?02/13/2022 10:12 AM 2.03 (H) 0.61 - 1.24 mg/dL Final  ?02/12/2022 09:58 AM 1.83 (H) 0.61 - 1.24 mg/dL Final  ?02/12/2022 04:15 AM 1.86 (H) 0.61 - 1.24 mg/dL Final  ?02/11/2022 04:29 PM 1.73 (H) 0.61 - 1.24 mg/dL Final  ?02/11/2022 05:56 AM 1.62 (H) 0.61 - 1.24 mg/dL Final  ?02/10/2022 03:53 PM 1.24 0.61 - 1.24 mg/dL Final  ?12/18/2021 09:10 AM 1.29 (H) 0.61 - 1.24 mg/dL Final  ?10/09/2021 09:45 AM 1.39 (H) 0.61 - 1.24 mg/dL Final  ?08/10/2021 10:19 AM 1.29 (H) 0.61 - 1.24 mg/dL Final  ?06/15/2021 10:01 AM 1.42 (H) 0.61 - 1.24 mg/dL Final  ?04/20/2021 10:16 AM 1.32 (H) 0.61 - 1.24 mg/dL Final  ?02/23/2021 09:12 AM 1.27 (H) 0.61 - 1.24 mg/dL Final  ?12/29/2020 09:30 AM 1.27 (H) 0.61 - 1.24 mg/dL Final  ?11/03/2020 10:08 AM 1.18 0.61 - 1.24 mg/dL Final  ?10/06/2020 10:27 AM 1.31 (H) 0.61 - 1.24 mg/dL Final  ?09/22/2020 09:37 AM 1.38 (H) 0.61 - 1.24 mg/dL Final  ?07/28/2020 09:38 AM 1.26 (H) 0.61 - 1.24 mg/dL Final  ?06/02/2020 09:01 AM 1.38 (H) 0.61 - 1.24 mg/dL Final  ?04/21/2020 09:24 AM 1.47 (H) 0.61 - 1.24 mg/dL Final  ?01/13/2020 12:50 PM 1.22 0.61 - 1.24 mg/dL Final  ? ?Recent Labs  ?Lab 03/12/22 ?6712 03/13/22 ?4580 03/15/22 ?1002  ?NA 137 137 135  ?K 5.3* 5.2* 5.1  ?CL 106 102 104  ?CO2 '24 24 24  '$ ?GLUCOSE 168* 90 207*  ?BUN 54* 54* 60*  ?CREATININE 2.06* 2.18* 2.68*  ?CALCIUM 9.1 9.1 9.3  ? ?Recent Labs  ?Lab 03/12/22 ?9983  ?WBC 7.4  ?NEUTROABS 5.1  ?HGB 7.8*  ?HCT 25.3*  ?MCV 92.7  ?PLT 274  ? ?Liver Function Tests: ?No results for input(s): AST, ALT, ALKPHOS, BILITOT, PROT, ALBUMIN in the last 168 hours. ?No results for input(s): LIPASE, AMYLASE in the last 168 hours. ?No results for input(s): AMMONIA in the last 168 hours. ?Cardiac Enzymes: ?No results for input(s): CKTOTAL, CKMB, CKMBINDEX, TROPONINI  in the last 168 hours. ?Iron Studies: No results for input(s): IRON, TIBC, TRANSFERRIN, FERRITIN in the last 72 hours. ?PT/INR: ?'@LABRCNTIP'$ (inr:5) ? ?Xrays/Other Studies: ?) ?Results for orders placed or performed during the hospital encounter of 02/26/22 (from the past 48 hour(s))  ?Glucose, capillary     Status: Abnormal  ? Collection Time: 03/14/22 11:37 AM  ?Result Value Ref Range  ? Glucose-Capillary 129 (H) 70 - 99 mg/dL  ?  Comment: Glucose reference range applies only to samples taken after fasting for at least 8 hours.  ?Glucose, capillary     Status: Abnormal  ? Collection Time: 03/14/22  4:47 PM  ?Result Value Ref Range  ? Glucose-Capillary 201 (H) 70 - 99 mg/dL  ?  Comment: Glucose reference range applies only to samples taken after fasting for at least 8 hours.  ?  Glucose, capillary     Status: Abnormal  ? Collection Time: 03/14/22 10:38 PM  ?Result Value Ref Range  ? Glucose-Capillary 111 (H) 70 - 99 mg/dL  ?  Comment: Glucose reference range applies only to samples taken after fasting for at least 8 hours.  ?Glucose, capillary     Status: Abnormal  ? Collection Time: 03/15/22  5:41 AM  ?Result Value Ref Range  ? Glucose-Capillary 129 (H) 70 - 99 mg/dL  ?  Comment: Glucose reference range applies only to samples taken after fasting for at least 8 hours.  ?Basic metabolic panel     Status: Abnormal  ? Collection Time: 03/15/22 10:02 AM  ?Result Value Ref Range  ? Sodium 135 135 - 145 mmol/L  ? Potassium 5.1 3.5 - 5.1 mmol/L  ? Chloride 104 98 - 111 mmol/L  ? CO2 24 22 - 32 mmol/L  ? Glucose, Bld 207 (H) 70 - 99 mg/dL  ?  Comment: Glucose reference range applies only to samples taken after fasting for at least 8 hours.  ? BUN 60 (H) 8 - 23 mg/dL  ? Creatinine, Ser 2.68 (H) 0.61 - 1.24 mg/dL  ? Calcium 9.3 8.9 - 10.3 mg/dL  ? GFR, Estimated 24 (L) >60 mL/min  ?  Comment: (NOTE) ?Calculated using the CKD-EPI Creatinine Equation (2021) ?  ? Anion gap 7 5 - 15  ?  Comment: Performed at [place] [place] [address], [place], [place] [address]  ?Glucose, capillary     Status: Abnormal  ? Collection Time: 03/15/22 12:00 PM  ?Result Value Ref Range  ? Glucose-Capillary 162 (H) 70 - 99 mg/dL  ?  Comment: Gl

## 2022-03-16 NOTE — Progress Notes (Signed)
Physical Therapy Session Note ? ?Patient Details  ?Name: Vernon [name] ?MRN: [id number] ?Date of Birth: December 11, 1945 ? ?Today's Date: 03/16/2022 ?PT Individual Time: 2669-1675 ?PT Individual Time Calculation (min): 55 min  ? ?Short Term Goals: ?Week 3:  PT Short Term Goal 1 (Week 3): STG=LTG due to ELOS ? ?Skilled Therapeutic Interventions/Progress Updates:  ? ?Pt received sitting in recliner and agreeable to PT. Pt performed stand pivot transfer with RW to WC and min assist  from PT for safety and cues for improved posture. Pt transported to rehab gym in [facility]. Sit<>stand transfers with supervision assist throughout session with LUE supported on weighted RW. ? ?Dynmaic balance and coordination training to performed lateral reach to obtain bean bag and toss to target on floor x 15 BUE.  ?Pt utilized reacher to pick bean bags up from floor 2 x 15 with LUE supported on RW. CGA for balance and improved awareness of lateral lean L intermittently.  ? ?Gait training with RW to weave through 8 cones x 2 with min assist overall and moderate cues for improved posture and step width to the L with turning to R to prevent lateral LOB to the L.  ?Additional gait training to room x 128ft with min assist overall with cues for standing restbreak to improve midline orientation and prevent lateral lean to the L.  ? ?Nustep reciprocal movement training x 4 min with cues for symmetric and decreased speed of movement intermittently.  ? ?Patient returned to room and performed stand pivot to recliner with RW and min assist. Pt left sitting in recliner with call bell in reach and all needs met.   ? ? ?   ?   ? ?Therapy Documentation ?Precautions:  ?Precautions ?Precautions: Fall ?Precaution Comments: [name], L weakness, L coordination deficit ?Restrictions ?Weight Bearing Restrictions: No ? ? ?Pain: ?Pain Assessment ?Pain Scale: 0-10 ?Pain Score: 0-No pain ? ? ? ?Therapy/Group: Individual Therapy ? ?Lorie Phenix ?03/16/2022, 10:51 AM  ?

## 2022-03-16 NOTE — Progress Notes (Signed)
Occupational Therapy Session Note ? ?Patient Details  ?Name: Vernon [name] ?MRN: [id number] ?Date of Birth: 19-May-1946 ? ?Today's Date: 03/16/2022 ?OT Individual Time: 2703-5009 ?OT Individual Time Calculation (min): 25 min  ? ? ?Short Term Goals: ?Week 3:  OT Short Term Goal 1 (Week 3): STG = LTG 2/2 ELOS ? ?Skilled Therapeutic Interventions/Progress Updates:  ?  Pt sitting up with no c/o pain. Session focused on truncal and BLE/BUE coordination and strengthening, as well as functional activity tolerance. Blocked practice sit <> stands from the recliner holding a 2lb medicine ball to challenge LE strength/balance and remove UE pushing to stand. Supervision to stand but CGA-min A to sit safely d/t tendency to sit uncontrolled. He completed 2x10 repetitions. Next he worked on closed chain functional reaching in diagonal (modified wood chops) in standing with the weighted ball to challenge core stability, 8 left and 8 right with min A for balance support. Pt was left sitting up in the recliner with all needs met, chair alarm set, and call bell within reach.  ? ? ?Therapy Documentation ?Precautions:  ?Precautions ?Precautions: Fall ?Precaution Comments: [name], L weakness, L coordination deficit ?Restrictions ?Weight Bearing Restrictions: No ? ?Therapy/Group: Individual Therapy ? ?Curtis Sites ?03/16/2022, 6:27 AM ?

## 2022-03-16 NOTE — Progress Notes (Signed)
?                                                       PROGRESS NOTE ? ? ?Subjective/Complaints: ?Eating much better  ?Appreciate renal consult  ?ROS- denies CP, SOB , N/V/D ? ? ?Objective: ?  ?No results found. ?No results for input(s): WBC, HGB, HCT, PLT in the last 72 hours. ? ? ?Recent Labs  ?  03/15/22 ?1002  ?NA 135  ?K 5.1  ?CL 104  ?CO2 24  ?GLUCOSE 207*  ?BUN 60*  ?CREATININE 2.68*  ?CALCIUM 9.3  ? ? ? ? ?Intake/Output Summary (Last 24 hours) at 03/16/2022 0912 ?Last data filed at 03/16/2022 0607 ?Gross per 24 hour  ?Intake 580 ml  ?Output 1650 ml  ?Net -1070 ml  ? ?  ? ?  ? ?Physical Exam: ?Vital Signs ?Blood pressure 102/60, pulse (!) 54, temperature 98.2 ?F (36.8 ?C), temperature source Oral, resp. rate 16, height '5\' 9"'$  (1.753 m), weight 85.1 kg, SpO2 96 %. ? ? ?General: No acute distress.   ?Mood and affect are appropriate ?Heart: Regular rate.  + Murmur. ?Lungs: Clear to auscultation, breathing unlabored, no rales or wheezes ?Abdomen: Positive bowel sounds, soft nontender to palpation, nondistended ?Extremities: No clubbing, cyanosis, or edema ?Skin: No evidence of breakdown, no evidence of rash ?Neurologic: Alert ?Motor: 5/5 in bilateral deltoid, bicep, tricep, grip, hip flexor, knee extensors, ankle dorsiflexor and plantar flexor ?Cerebellar exam LUE ataxia, improving ?Musculoskeletal: Full range of motion in all 4 extremities. No joint swelling ? ?Assessment/Plan: ?1. Functional deficits which require 3+ hours per day of interdisciplinary therapy in a comprehensive inpatient rehab setting. ?Physiatrist is providing close team supervision and 24 hour management of active medical problems listed below. ?Physiatrist and rehab team continue to assess barriers to discharge/monitor patient progress toward functional and medical goals ? ?Care Tool: ? ?Bathing ? Bathing activity did not occur: Safety/medical concerns ?Body parts bathed by patient: Right arm, Left arm, Chest, Abdomen, Face, Front perineal  area, Right upper leg, Left upper leg, Right lower leg, Left lower leg  ? Body parts bathed by helper: Buttocks ?  ?  ?Bathing assist Assist Level: Minimal Assistance - Patient > 75% ?  ?  ?Upper Body Dressing/Undressing ?Upper body dressing Upper body dressing/undressing activity did not occur (including orthotics): Safety/medical concerns ?What is the patient wearing?: Pull over shirt ?   ?Upper body assist Assist Level: Supervision/Verbal cueing ?   ?Lower Body Dressing/Undressing ?Lower body dressing ? ? ? Lower body dressing activity did not occur: Safety/medical concerns ?What is the patient wearing?: Incontinence brief, Pants ? ?  ? ?Lower body assist Assist for lower body dressing: Minimal Assistance - Patient > 75% ?   ? ?Toileting ?Toileting    ?Toileting assist Assist for toileting: Moderate Assistance - Patient 50 - 74% (stedy) ?  ?  ?Transfers ?Chair/bed transfer ? ?Transfers assist ? Chair/bed transfer activity did not occur: Safety/medical concerns ? ?Chair/bed transfer assist level: Minimal Assistance - Patient > 75% ?Chair/bed transfer assistive device: Armrests, Walker ?  ?Locomotion ?Ambulation ? ? ?Ambulation assist ? ? Ambulation activity did not occur: Safety/medical concerns ? ?Assist level: Minimal Assistance - Patient > 75% ?Assistive device: Walker-rolling (weighted RW) ?Max distance: 61f  ? ?Walk 10 feet activity ? ? ?Assist ? Walk 10 feet activity  did not occur: Safety/medical concerns ? ?  ?   ? ?Walk 50 feet activity ? ? ?Assist Walk 50 feet with 2 turns activity did not occur: Safety/medical concerns ? ?  ?   ? ? ?Walk 150 feet activity ? ? ?Assist Walk 150 feet activity did not occur: Safety/medical concerns ? ?  ?  ?  ? ?Walk 10 feet on uneven surface  ?activity ? ? ?Assist Walk 10 feet on uneven surfaces activity did not occur: Safety/medical concerns ? ? ?  ?   ? ?Wheelchair ? ? ? ? ?Assist Is the patient using a wheelchair?: Yes ?Type of Wheelchair: Manual ?Wheelchair activity  did not occur: Safety/medical concerns ? ?  ?   ? ? ?Wheelchair 50 feet with 2 turns activity ? ? ? ?Assist ? ?  ?Wheelchair 50 feet with 2 turns activity did not occur: Safety/medical concerns ? ? ?   ? ?Wheelchair 150 feet activity  ? ? ? ?Assist ? Wheelchair 150 feet activity did not occur: Safety/medical concerns ? ? ?   ? ?Blood pressure 102/60, pulse (!) 54, temperature 98.2 ?F (36.8 ?C), temperature source Oral, resp. rate 16, height '5\' 9"'$  (1.753 m), weight 85.1 kg, SpO2 96 %. ? ?Medical Problem List and Plan: ?1. 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 Continue CIR, 4/25 d/c date ?Pt will f/u with PCP Dr Edwina Barth at [facility]  ?2.  Antithrombotics: ?-DVT/anticoagulation:  Pharmaceutical: Heparin initiated 02/12/2022 ?            -antiplatelet therapy: N/A ?3. Pain Management: Tylenol as needed ?4. Decreased arousal as well as agitation improved :  ?Improved d/c amantadine, d/c zyprexa   ? ?5. Neuropsych: This patient is capable of making medical decisions on his own behalf. ?Will ask Neuropsych to help delineate his cognitive issues and capacity ?SLP has noted improvement in cognition as well ? TeleSitter for safety ?6. Skin/Wound Care: Routine skin checks ?7. Fluids/Electrolytes/Nutrition: Routine in and outs ?8.  Postop large right-sided pleural effusion.  Status postthoracentesis with 1250 cc yield.  Latest chest x-ray unremarkable ?9.  Seizure prophylaxis.  Keppra 500 mg twice daily completed ?10.  Atrial fibrillation.  Eliquis currently on hold.  Neurology considering resuming in approximately 4 weeks.  Cardiac rate controlled ?11.  Diabetes mellitus with hyperglycemia.  Hemoglobin A1c 6.2.  CBG (last 3)  ?Recent Labs  ?  03/15/22 ?1724 03/15/22 ?2102 03/16/22 ?0612  ?GLUCAP 124* 150* 124*  ? ?Semglee to 20U   ? Controlled 4/21 ?12.  Chronic anemia.   ? Hemoglobin 7.9 on 4/12, relatively stable, labs ordered for Monday ?13  Hypertension.  Continue Coreg 3.125 mg twice daily.  Monitor with increased mobility ?          ?Vitals:  ? 03/15/22 2005 03/16/22 0330  ?BP: 117/64 102/60  ?Pulse: 83 (!) 54  ?Resp: 18 16  ?Temp: 98.2 ?F (36.8 ?C) 98.2 ?F (36.8 ?C)  ?SpO2: 100% 96%  ? Running low with bradycardia , d/c coreg  ?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  improved , intake much better  ?  ?15. UTI with <100,000 E Coli. Keflex started. Sens to Keflex finished 7-day treatment but repeat UA showed + nitrite, >50WBC, many bact ?Restart Keflex pending cx ? 16.  Anemia - hx of myelodysplastic d/o sees hematologist Dr Dorthula Perfect at [facility]  ? ?  Latest Ref Rng & Units 03/12/2022  ?  5:13 AM 03/07/2022  ?  9:59 AM 02/27/2022  ?  5:20 AM  ?CBC  ?WBC 4.0 - 10.5 K/uL 7.4   7.8   10.5    ?Hemoglobin 13.0 - 17.0 g/dL 7.8   7.9   8.0    ?Hematocrit 39.0 - 52.0 % 25.3   25.5   24.7    ?Platelets 150 - 400 K/uL 274   235   342    ?17.  CKD stage III ? Creatinine 1.69 on 4/4, up to 2.09, increase free H20, no nephro toxic meds, will give IVF bolus  ? ?  Latest Ref Rng & Units 03/15/2022  ? 10:02 AM 03/13/2022  ?  5:25 AM 03/12/2022  ?  5:13 AM  ?BMP  ?Glucose 70 - 99 mg/dL 207   90   168    ?BUN 8 - 23 mg/dL 60   54   54    ?Creatinine 0.61 - 1.24 mg/dL 2.68   2.18   2.06    ?Sodium 135 - 145 mmol/L 135   137   137    ?Potassium 3.5 - 5.1 mmol/L 5.1   5.2   5.3    ?Chloride 98 - 111 mmol/L 104   102   106    ?CO2 22 - 32 mmol/L '24   24   24    '$ ?Calcium 8.9 - 10.3 mg/dL 9.3   9.1   9.1    ? ? consult nephro , recheck UA, daily BMET x 3  ?LOS: ?18 days ?A FACE TO FACE EVALUATION WAS PERFORMED ? ?Luanna Salk Bethany Cumming ?03/16/2022, 9:12 AM  ? ? ? ?

## 2022-03-16 NOTE — Progress Notes (Signed)
Patient ID: Vernon [name], male   DOB: 03-Feb-1946, 76 y.o.   MRN: [id number] ? ?SW reached out to [facility]. Agency will be providing [facility] services to the patient at discharge. SW informed that they agency has all information required by [place] and they will reach out to family to schedule patient nursing assessment.  ?

## 2022-03-16 NOTE — Progress Notes (Signed)
Occupational Therapy Session Note ? ?Patient Details  ?Name: Vernon [name] ?MRN: [id number] ?Date of Birth: Jul 13, 1946 ? ?Today's Date: 03/16/2022 ?OT Individual Time: 8546-2703 ?OT Individual Time Calculation (min): 54 min  ? ? ?Short Term Goals: ?Week 1:  OT Short Term Goal 1 (Week 1): Pt will bathe UB seated at sink with min A. ?OT Short Term Goal 1 - Progress (Week 1): Met ?OT Short Term Goal 2 (Week 1): Pt will complete > 2 grooming task at sink with S. ?OT Short Term Goal 2 - Progress (Week 1): Not met ?OT Short Term Goal 3 (Week 1): Pt will complete sit to stand with min A in prep for standing ADL. ?OT Short Term Goal 3 - Progress (Week 1): Not met ?OT Short Term Goal 4 (Week 1): Pt will complete toilet transfer with max A and LRAD. ?OT Short Term Goal 4 - Progress (Week 1): Not met ?Week 2:  OT Short Term Goal 1 (Week 2): Pt will complete toilet transfer with max A and LRAD. ?OT Short Term Goal 1 - Progress (Week 2): Met ?OT Short Term Goal 2 (Week 2): Pt will complete sit to stand with min A in prep for standing ADL. ?OT Short Term Goal 2 - Progress (Week 2): Met ?OT Short Term Goal 3 (Week 2): Pt will complete > 2 grooming task at sink with S. ?OT Short Term Goal 3 - Progress (Week 2): Met ?OT Short Term Goal 4 (Week 2): Pt will utilize external aids to with no more than mod cuing to orient self to location. ?OT Short Term Goal 4 - Progress (Week 2): Met ?Week 3:  OT Short Term Goal 1 (Week 3): STG = LTG 2/2 ELOS ? ?Skilled Therapeutic Interventions/Progress Updates:  ?  Pt received semi-reclined in bed, no c/o pain, agreeable to therapy. Session focus on self-care retraining, activity tolerance, transfer retraining in prep for improved ADL/IADL/func mobility performance + decreased caregiver burden. Came to sitting EOB close S, denies need for shower this date. Set-up A to change out shirt, CGA to light min A for balance sitting EOB and in stance for donning pants and shoes, use of weighted RW throughout.  Short ambulatory transfer with weighted RW > recliner with light min A for RW management.  ? ?Set-up to only open milk and orange juice and pt self-fed 100% breakfast. Discussed home set-up, DC and DME recs. Pt has "large ADA" step over shower with built in shower seat and grab bars, wife will take picture to show therapy. Will see if TTB will fit to improve ind/safety in shower transfers vs having pt step over 5 inch step. ? ?Issued theraputty therex + [facility] activities sheet for HEP. Pt verbalized understanding. ? ?Pt left seated in recliner with safety belt alarm engaged, call bell in reach, and all immediate needs met.  ? ? ?Therapy Documentation ?Precautions:  ?Precautions ?Precautions: Fall ?Precaution Comments: [name], L weakness, L coordination deficit ?Restrictions ?Weight Bearing Restrictions: No ? ?Pain: no c/o throughout ?  ?ADL: See Care Tool for more details. ? ? ?Therapy/Group: Individual Therapy ? ?Volanda Napoleon MS, OTR/L ? ?03/16/2022, 6:53 AM ?

## 2022-03-17 LAB — GLUCOSE, CAPILLARY
Glucose-Capillary: 109 mg/dL — ABNORMAL HIGH (ref 70–99)
Glucose-Capillary: 133 mg/dL — ABNORMAL HIGH (ref 70–99)
Glucose-Capillary: 200 mg/dL — ABNORMAL HIGH (ref 70–99)
Glucose-Capillary: 181 mg/dL — ABNORMAL HIGH (ref 70–99)

## 2022-03-17 LAB — BASIC METABOLIC PANEL
Anion gap: 8 (ref 5–15)
BUN: 46 mg/dL — ABNORMAL HIGH (ref 8–23)
CO2: 20 mmol/L — ABNORMAL LOW (ref 22–32)
Calcium: 9.2 mg/dL (ref 8.9–10.3)
Chloride: 106 mmol/L (ref 98–111)
Creatinine, Ser: 2.19 mg/dL — ABNORMAL HIGH (ref 0.61–1.24)
GFR, Estimated: 31 mL/min — ABNORMAL LOW (ref >60)
Glucose, Bld: 126 mg/dL — ABNORMAL HIGH (ref 70–99)
Potassium: 5 mmol/L (ref 3.5–5.1)
Sodium: 134 mmol/L — ABNORMAL LOW (ref 135–145)

## 2022-03-17 NOTE — Progress Notes (Signed)
?[facility] ?Progress Note  ? ?76 y.o. 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 ?Assessment/ Plan:   ?AKI on CKD 3a ?Cr 2.68 <2.18 <2.06. Baseline appears to be ~1.3-1.42. GFR 24 ?UA with nitrite positive, large leuks, >50WBC, many bacteria  ?UOP 1.950 L yesterday. Received 500cc 0.45 NS bolus today  ?Unclear cause of kidney injury but appears to be hemodynamically mediated and he may be very sensitive to changes in BP. He appears euvolemic on exam, without any edema, JVD, or other signs of volume overload.  He also  has been urinating well with no obstructive symptoms.  Although UA indicating infection, he is asymptomatic without dysuria, urgency, frequency or hematuria.  No feeling of abdominal fullness after urinating.  No recent Foley. I also do not see any nephrotoxic agents like NSAID's or contrast.  ? ?Interestingly he had an episode of AKI earlier in the hospitalization prior to transfer to CIR that resolved with his cr peaking at 2.03 before slowly returning to the 1.3-1.45 range. ?  ?- Renal ultrasound showed mildly atrophic kidneys but no hydronephrosis. ?- UPC 0.22, repeat urinalysis shows still WBC's and lots of bacteria -> culture sent. He is asymptomatic. ?- Wonder if his "soft" bp's may be causing decreased renal perfusion; agree with discontinuing the bblr. Fortunately renal function improved but likely will continue to fluctuate. Interstitial nephritis lower on the differential but if cultures are negative then certainly possible if leukocyturia persists. ?  ?-Maintain MAP>65 for optimal renal perfusion.  ?-Avoid nephrotoxic medications including NSAIDs, contrast  if possible ?  ? 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 Follows with CIR ?A fib- Eliquis on hold. Will follow up with Neurology outpatient  ?Moderate aortic valve stenosis  ?DM2- A1c 6.2. On Semglee 10u daily and mSSI ?HLD- Simvastatin '40mg'$  daily ?GERD- Protonix '40mg'$  daily ?HTN- BP soft. Currently 102/60. 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 monitored with CBCs  ? ?Subjective:   ?Denies f/c/n/v/sob/ obstructive symptoms.   ? ?Objective:   ?BP 104/67 (BP Location: Right Arm)   Pulse 62   Temp 98.3 ?F (36.8 ?C) (Oral)   Resp 16   Ht '5\' 9"'$  (1.753 m)   Wt 83.8 kg   SpO2 96%   BMI 27.28 kg/m?  ? ?Intake/Output Summary (Last 24 hours) at 03/17/2022 1122 ?Last data filed at 03/17/2022 9476 ?Gross per 24 hour  ?Intake 471 ml  ?Output 2500 ml  ?Net -2029 ml  ? ?Weight change:  ? ?Physical Exam: ?General appearance: alert, pleasant, sitting up in the bec, NAD ?Head: [place]/AT. MMM ?Eyes: EOMI. Normal conjunctiva  ?Resp: CTAB normal WOB  ?CV: RRR. 3/6 systolic murmur  ?LY:YTKP, non distended, non tender ?Extremities: warm, dry. No LE edema  ? ?Imaging: ?US RENAL ? ?Result Date: 03/16/2022 ?CLINICAL DATA:  AKI. History of hypertension and diabetes. EXAM: RENAL / URINARY TRACT ULTRASOUND COMPLETE COMPARISON:  Abdominal ultrasound 01/19/2020 FINDINGS: Right Kidney: Renal measurements: 11.4 x 6.1 x 6.9 cm = volume: 272 mL. Diffuse mild cortical thinning. Slight increased echogenicity. No solid mass or hydronephrosis visualized. There are 2 anechoic circumscribed cystic masses, the larger measuring 13.7 cm at the  middle to lower third of the kidney with a single minimally thickened smooth septations. This cystic mass is mildly increased in size compared to 01/19/2020. No new solid component or thickening of the single septation. An additional  4.1 cm simple cyst is seen at the upper third of the kidney not significantly changed compared to 01/19/2020. Left Kidney: Renal measurements: 11.9 x 6.4 x 6.1 cm = volume: 242 mL. Diffuse mild cortical thinning. Slight increased echogenicity. No solid mass or hydronephrosis visualized. Several anechoic circumscribed cystic cortical masses measuring up to 7.1 cm at the lower pole. Bladder: Appears normal for degree of bladder distention. Other: Visualized portions of the spleen are unremarkable. IMPRESSION: 1. Mildly atrophic kidneys consistent with changes of medical renal disease. No acute abnormality of the kidneys. 2. A minimally complex cyst at the right kidney is mildly increased in size compared to 01/19/2020 without interval development of abnormal calcification or solid component. Recommend follow-up in 1 year. 3. Other benign simple cortical renal cysts are noted bilaterally. 4. Normal appearance of the urinary bladder. Electronically Signed   By: Ileana Roup M.D.   On: 03/16/2022 17:12   ? ?Labs: ?BMET ?Recent Labs  ?Lab 03/12/22 ?4373 03/13/22 ?5789 03/15/22 ?1002 03/17/22 ?7847  ?NA 137 137 135 134*  ?K 5.3* 5.2* 5.1 5.0  ?CL 106 102 104 106  ?CO2 '24 24 24 '$ 20*  ?GLUCOSE 168* 90 207* 126*  ?BUN 54* 54* 60* 46*  ?CREATININE 2.06* 2.18* 2.68* 2.19*  ?CALCIUM 9.1 9.1 9.3 9.2  ? ?CBC ?Recent Labs  ?Lab 03/12/22 ?8412  ?WBC 7.4  ?NEUTROABS 5.1  ?HGB 7.8*  ?HCT 25.3*  ?MCV 92.7  ?PLT 274  ? ? ?Medications:   ? ? (feeding supplement) PROSource Plus  30 mL Oral BID BM  ? cephALEXin  250 mg Oral Q12H  ? feeding supplement  237 mL Oral BID BM  ? heparin injection (subcutaneous)  5,000 Units Subcutaneous Q8H  ? insulin aspart  0-15 Units Subcutaneous TID AC & HS  ? insulin glargine-yfgn  10 Units Subcutaneous Daily  ? mouth rinse  15 mL Mouth Rinse BID  ? megestrol  400 mg Oral BID  ? pantoprazole  40 mg Oral Daily  ? polyethylene glycol  17 g Oral Daily  ? senna-docusate  1 tablet Oral BID  ? ? ? ? ?Otelia Santee,  MD ?03/17/2022, 11:22 AM  ? ?

## 2022-03-17 NOTE — Progress Notes (Signed)
Speech Language Pathology Daily Session Note ? ?Patient Details  ?Name: Vernon [name] ?MRN: [id number] ?Date of Birth: [birth year] ? ?Today's Date: 03/17/2022 ?SLP Individual Time: 2694-8546 ?SLP Individual Time Calculation (min): 43 min ? ?Short Term Goals: ?Week 3: SLP Short Term Goal 1 (Week 3): STG=LTG due to ELOS ? ?Skilled Therapeutic Interventions: ?Pt seen for skilled ST with focus on cognitive goals, pt upright in chair and agreeable to therapeutic tasks. Pt oriented x4, demonstrates increased emergent awareness of cognitive and swallowing deficits from previous ST session. Pt Supervision A in detailing ways he will increase safety at home and prevent falls. [name] brought in pictures of bathroom, front steps at home, etc and pt completing problem solving task for home environment with picture stimulus with overall Supervision A. Pt re-educated on recommended swallow precautions, verbalized understanding. Pt left in recliner with alarm belt activated and all needs within reach. Cont ST POC. ? ?Pain ?Pain Assessment ?Pain Scale: 0-10 ?Pain Score: 0-No pain ? ?Therapy/Group: Individual Therapy ? ?Dewaine Conger ?03/17/2022, 2:29 PM ?

## 2022-03-17 NOTE — Progress Notes (Signed)
Physical Therapy Session Note ? ?Patient Details  ?Name: Vernon [name] ?MRN: [id number] ?Date of Birth: 1946-10-18 ? ?Today's Date: 03/17/2022 ?PT Individual Time: 0102-7253 ?PT Individual Time Calculation (min): 40 min  ? ?Short Term Goals: ? ?Week 3:  PT Short Term Goal 1 (Week 3): STG=LTG due to ELOS ? ?Skilled Therapeutic Interventions/Progress Updates:  ? ?Pt received sitting in recliner and agreeable to PT. Pt performed stand pivot transfer with RW and min A* from PT for safety. [name] and son present for therapy session.  ? ?Pt transported to rehab gym in [facility]. Gait training with min assist from PT x 71ft with cues for safety in turns to the R and improved step width on the LLE. Gait training also performed with assist provided by wife x 161ft with min assist and cues for posture, safety in doorway management, and decreased speed in turns to reduce fall risk.  ? ?Stair management training 2 x 4 steps with BUE supported on 1 rail. Pt noted to have improved safety with descending with the RLE first and BLE on the L side rail. Min assist throughout from PT.  ? ?Patient returned to room and performed stand pivot to recliner with RW and min assist on the R. Pt left sitting in recliner with call bell in reach and all needs met.   ? ?   ? ?Therapy Documentation ?Precautions:  ?Precautions ?Precautions: Fall ?Precaution Comments: [name], L weakness, L coordination deficit ?Restrictions ?Weight Bearing Restrictions: No ? ?Vital Signs: ?Therapy Vitals ?Temp: 97.7 ?F (36.5 ?C) ?Temp Source: Oral ?Pulse Rate: 73 ?Resp: 17 ?BP: (!) 143/83 ?Patient Position (if appropriate): Sitting ?Oxygen Therapy ?SpO2: 100 % ?O2 Device: Room Air ?Pain: ?Pain Assessment ?Pain Scale: 0-10 ?Pain Score: 0-No pain ? ? ?Therapy/Group: Individual Therapy ? ?Lorie Phenix ?03/17/2022, 5:45 PM  ?

## 2022-03-17 NOTE — Progress Notes (Signed)
PROGRESS NOTE   Subjective/Complaints:  Today patient's appetite is improved. Dr. Juel Burrow (nephrology) has seen pt regarding AKI on CKD 3a. Patient reports no urinary symptoms such as dysuria, frequency, urgency, or hematuria despite UA positive for nitrite, bacteria, and leukocytes.  He reports no abdominal fullness after voiding.  ROS- Denies fever, N/V/D, CP, or SOB.   Objective:   US RENAL  Result Date: 03/16/2022 CLINICAL DATA:  AKI. History of hypertension and diabetes. EXAM: RENAL / URINARY TRACT ULTRASOUND COMPLETE COMPARISON:  Abdominal ultrasound 01/19/2020 FINDINGS: Right Kidney: Renal measurements: 11.4 x 6.1 x 6.9 cm = volume: 272 mL. Diffuse mild cortical thinning. Slight increased echogenicity. No solid mass or hydronephrosis visualized. There are 2 anechoic circumscribed cystic masses, the larger measuring 13.7 cm at the middle to lower third of the kidney with a single minimally thickened smooth septations. This cystic mass is mildly increased in size compared to 01/19/2020. No new solid component or thickening of the single septation. An additional 4.1 cm simple cyst is seen at the upper third of the kidney not significantly changed compared to 01/19/2020. Left Kidney: Renal measurements: 11.9 x 6.4 x 6.1 cm = volume: 242 mL. Diffuse mild cortical thinning. Slight increased echogenicity. No solid mass or hydronephrosis visualized. Several anechoic circumscribed cystic cortical masses measuring up to 7.1 cm at the lower pole. Bladder: Appears normal for degree of bladder distention. Other: Visualized portions of the spleen are unremarkable. IMPRESSION: 1. Mildly atrophic kidneys consistent with changes of medical renal disease. No acute abnormality of the kidneys. 2. A minimally complex cyst at the right kidney is mildly increased in size compared to 01/19/2020 without interval development of abnormal calcification or solid  component. Recommend follow-up in 1 year. 3. Other benign simple cortical renal cysts are noted bilaterally. 4. Normal appearance of the urinary bladder. Electronically Signed   By: Sherron Ales M.D.   On: 03/16/2022 17:12   No results for input(s): WBC, HGB, HCT, PLT in the last 72 hours.   Recent Labs    03/15/22 1002 03/17/22 0535  NA 135 134*  K 5.1 5.0  CL 104 106  CO2 24 20*  GLUCOSE 207* 126*  BUN 60* 46*  CREATININE 2.68* 2.19*  CALCIUM 9.3 9.2     Intake/Output Summary (Last 24 hours) at 03/17/2022 1413 Last data filed at 03/17/2022 1359 Gross per 24 hour  Intake 708 ml  Output 2700 ml  Net -1992 ml        Physical Exam: Vital Signs Blood pressure (!) 143/83, pulse 73, temperature 97.7 F (36.5 C), temperature source Oral, resp. rate 17, height 5\' 9"  (1.753 m), weight 83.8 kg, SpO2 100 %.   General: Not in acute distress.  Heart: Regular rate. + Murmur. Lungs: Unlabored breathing, clear to auscultation, no wheezes or rales. Abdomen: +BS all 4 quads, soft, non-tender to palpation, non-distended. Extremities: No edema, clubbing, or cyanosis. Skin: No rash or skin breakdown. Neurologic: Awake and alert Motor: 5/5 bilateral deltoid, bicep, tricep, grip, hip flexor, knee extensors, ankle and plantar dorsiflexion. Cerebellar exam: LUE ataxia improving. Musculoskeletal: FROM all extremities.  No joint swelling present. Psych: Appropriate mood/affect.  Assessment/Plan: 1. Functional deficits which  require 3+ hours per day of interdisciplinary therapy in a comprehensive inpatient rehab setting. Physiatrist is providing close team supervision and 24 hour management of active medical problems listed below. Physiatrist and rehab team continue to assess barriers to discharge/monitor patient progress toward functional and medical goals  Care Tool:  Bathing  Bathing activity did not occur: Safety/medical concerns Body parts bathed by patient: Right arm, Left arm,  Chest, Abdomen, Face, Front perineal area, Right upper leg, Left upper leg, Right lower leg, Left lower leg   Body parts bathed by helper: Buttocks     Bathing assist Assist Level: Minimal Assistance - Patient > 75%     Upper Body Dressing/Undressing Upper body dressing Upper body dressing/undressing activity did not occur (including orthotics): Safety/medical concerns What is the patient wearing?: Pull over shirt    Upper body assist Assist Level: Supervision/Verbal cueing    Lower Body Dressing/Undressing Lower body dressing    Lower body dressing activity did not occur: Safety/medical concerns What is the patient wearing?: Incontinence brief, Pants     Lower body assist Assist for lower body dressing: Minimal Assistance - Patient > 75%     Toileting Toileting    Toileting assist Assist for toileting: Moderate Assistance - Patient 50 - 74% (stedy)     Transfers Chair/bed transfer  Transfers assist  Chair/bed transfer activity did not occur: Safety/medical concerns  Chair/bed transfer assist level: Minimal Assistance - Patient > 75% Chair/bed transfer assistive device: Armrests, Geologist, engineering   Ambulation assist   Ambulation activity did not occur: Safety/medical concerns  Assist level: Minimal Assistance - Patient > 75% Assistive device: Walker-rolling (weighted RW) Max distance: 41ft   Walk 10 feet activity   Assist  Walk 10 feet activity did not occur: Safety/medical concerns        Walk 50 feet activity   Assist Walk 50 feet with 2 turns activity did not occur: Safety/medical concerns         Walk 150 feet activity   Assist Walk 150 feet activity did not occur: Safety/medical concerns         Walk 10 feet on uneven surface  activity   Assist Walk 10 feet on uneven surfaces activity did not occur: Safety/medical concerns         Wheelchair     Assist Is the patient using a wheelchair?: Yes Type of  Wheelchair: Manual Wheelchair activity did not occur: Safety/medical concerns         Wheelchair 50 feet with 2 turns activity    Assist    Wheelchair 50 feet with 2 turns activity did not occur: Safety/medical concerns       Wheelchair 150 feet activity     Assist  Wheelchair 150 feet activity did not occur: Safety/medical concerns       Blood pressure (!) 143/83, pulse 73, temperature 97.7 F (36.5 C), temperature source Oral, resp. rate 17, height  (1.753 m), weight 83.8 kg, SpO2 100 %.  Medical Problem List and Plan: 1. 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  Antithrombotics: -DVT/anticoagulation:  Pharmaceutical: Heparin initiated 02/12/2022             -antiplatelet therapy: N/A 3. Pain Management: Tylenol as needed 4. Decreased arousal as well  as agitation improved :  Improved d/c amantadine, d/c zyprexa    5. Neuropsych: This patient is capable of making medical decisions on his own behalf. Will ask Neuropsych to help delineate his cognitive issues and capacity SLP has noted improvement in cognition as well  TeleSitter for safety 4/22 TeleSitter use continued for safety. 6. Skin/Wound Care: Routine skin checks 7. Fluids/Electrolytes/Nutrition: Routine in and outs 8.  Postop large right-sided pleural effusion.  Status postthoracentesis with 1250 cc yield.  Latest chest x-ray unremarkable 9.  Seizure prophylaxis.  Keppra 500 mg twice daily completed 10.  Atrial fibrillation.  Eliquis currently on hold.  Neurology considering resuming in approximately 4 weeks.  Cardiac rate controlled 11.  Diabetes mellitus with hyperglycemia.  Hemoglobin A1c 6.2.  CBG (last 3)  Recent Labs     03/16/22 2123 03/17/22 0625 03/17/22 1134  GLUCAP 124* 109* 133*  Semglee to 20U   Controlled 4/21 -4/22 controlled with 20U Semglee, continue current regimen 12.  Chronic anemia.    Hemoglobin 7.9 on 4/12, relatively stable, labs ordered for Monday           -4/22 No new labs pending CBC for 4/24  13  Hypertension.  Continue Coreg 3.125 mg twice daily.  Monitor with increased mobility           Vitals:   03/17/22 0344 03/17/22 1348  BP: 104/67 (!) 143/83  Pulse: 62 73  Resp: 16 17  Temp: 98.3 F (36.8 C) 97.7 F (36.5 C)  SpO2: 96% 100%   Running low with bradycardia , d/c coreg   4/22 HR 60-70's following d/c of Coreg, B/P fairly well controlled with with aberrant spike in b/p this afternoon.  Continue to trend and monitor. 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  improved , intake much better    15. UTI with <100,000 E Coli. Keflex started. Sens to Keflex finished 7-day treatment but repeat UA showed + nitrite, >50WBC, many bact Restart Keflex pending cx -Keflex 250 mg BID initiated on 03/16/22, continue treatment, culture still pending -4/22 Pt is asymptomatic for urinary symptoms, continue to monitor   16.  Anemia - hx of myelodysplastic d/o sees hematologist Dr Alicia Amel at [facility]     Latest Ref Rng & Units 03/12/2022    5:13 AM 03/07/2022    9:59 AM 02/27/2022    5:20 AM  CBC  WBC 4.0 - 10.5 K/uL 7.4   7.8   10.5    Hemoglobin 13.0 - 17.0 g/dL 7.8   7.9   8.0    Hematocrit 39.0 - 52.0 % 25.3   25.5   24.7    Platelets 150 - 400 K/uL 274   235   342     17.  CKD stage III  Creatinine 1.69 on 4/4, up to 2.09, increase free H20, no nephro toxic meds, will give IVF bolus   4/22 BUN and Crt trending down from past 2 days: BUN 60>>46 and Crt 2.68>>2.19.   -Per nephrology, monitor daily weights, I/O's, maintain MAP >65 for optimal renal perfusion, avoid NSAID's or contrast dye. -Beta blocker was discontinued to ensure that "soft BP's" were not adversely affecting renal  perfusion. -Continue to monitor B/P. -Renal US showed mildly atrophic kidneys but with no hydronephrosis -UPC 0.22, UA following initial 7 day course of Keflex shows leukocytes, and lots of bacteria, pt is asymptomatic. -4/22 pending C & S, daily BMET x3.        Latest Ref Rng & Units  03/17/2022    5:35 AM 03/15/2022   10:02 AM 03/13/2022    5:25 AM  BMP  Glucose 70 - 99 mg/dL 098   119   90    BUN 8 - 23 mg/dL 46   60   54    Creatinine 0.61 - 1.24 mg/dL 1.47   8.29   5.62    Sodium 135 - 145 mmol/L 134   135   137    Potassium 3.5 - 5.1 mmol/L 5.0   5.1   5.2    Chloride 98 - 111 mmol/L 106   104   102    CO2 22 - 32 mmol/L Calcium 8.9 - 10.3 mg/dL 9.2   9.3   9.1      LOS: 19 days A FACE TO FACE EVALUATION WAS PERFORMED  Tressia Miners 03/17/2022, 2:13 PM

## 2022-03-18 LAB — GLUCOSE, CAPILLARY
Glucose-Capillary: 139 mg/dL — ABNORMAL HIGH (ref 70–99)
Glucose-Capillary: 178 mg/dL — ABNORMAL HIGH (ref 70–99)
Glucose-Capillary: 196 mg/dL — ABNORMAL HIGH (ref 70–99)
Glucose-Capillary: 183 mg/dL — ABNORMAL HIGH (ref 70–99)

## 2022-03-18 LAB — URINE CULTURE: Culture: >=100000 — AB

## 2022-03-18 LAB — BASIC METABOLIC PANEL
Anion gap: 8 (ref 5–15)
BUN: 38 mg/dL — ABNORMAL HIGH (ref 8–23)
CO2: 22 mmol/L (ref 22–32)
Calcium: 9 mg/dL (ref 8.9–10.3)
Chloride: 108 mmol/L (ref 98–111)
Creatinine, Ser: 2.04 mg/dL — ABNORMAL HIGH (ref 0.61–1.24)
GFR, Estimated: 33 mL/min — ABNORMAL LOW (ref >60)
Glucose, Bld: 127 mg/dL — ABNORMAL HIGH (ref 70–99)
Potassium: 4.7 mmol/L (ref 3.5–5.1)
Sodium: 138 mmol/L (ref 135–145)

## 2022-03-18 NOTE — Progress Notes (Signed)
PROGRESS NOTE   Subjective/Complaints:  Today patient's appetite is good, eating breakfast and reports having slept well.  Dr. Juel Burrow (nephrology) was consulted regarding AKI on CKD 3a. Patient reports no urinary symptoms such as dysuria, frequency, urgency, or hematuria despite UA positive for nitrite, bacteria, and leukocytes.  He reports no abdominal fullness after voiding. Cultures are back, but per nephrology, no treatment indicated.  ROS- Denies fever, N/V/D, CP, or SOB.   Objective:   US RENAL  Result Date: 03/16/2022 CLINICAL DATA:  AKI. History of hypertension and diabetes. EXAM: RENAL / URINARY TRACT ULTRASOUND COMPLETE COMPARISON:  Abdominal ultrasound 01/19/2020 FINDINGS: Right Kidney: Renal measurements: 11.4 x 6.1 x 6.9 cm = volume: 272 mL. Diffuse mild cortical thinning. Slight increased echogenicity. No solid mass or hydronephrosis visualized. There are 2 anechoic circumscribed cystic masses, the larger measuring 13.7 cm at the middle to lower third of the kidney with a single minimally thickened smooth septations. This cystic mass is mildly increased in size compared to 01/19/2020. No new solid component or thickening of the single septation. An additional 4.1 cm simple cyst is seen at the upper third of the kidney not significantly changed compared to 01/19/2020. Left Kidney: Renal measurements: 11.9 x 6.4 x 6.1 cm = volume: 242 mL. Diffuse mild cortical thinning. Slight increased echogenicity. No solid mass or hydronephrosis visualized. Several anechoic circumscribed cystic cortical masses measuring up to 7.1 cm at the lower pole. Bladder: Appears normal for degree of bladder distention. Other: Visualized portions of the spleen are unremarkable. IMPRESSION: 1. Mildly atrophic kidneys consistent with changes of medical renal disease. No acute abnormality of the kidneys. 2. A minimally complex cyst at the right kidney is mildly  increased in size compared to 01/19/2020 without interval development of abnormal calcification or solid component. Recommend follow-up in 1 year. 3. Other benign simple cortical renal cysts are noted bilaterally. 4. Normal appearance of the urinary bladder. Electronically Signed   By: Sherron Ales M.D.   On: 03/16/2022 17:12   No results for input(s): WBC, HGB, HCT, PLT in the last 72 hours.   Recent Labs    03/17/22 0535 03/18/22 0616  NA 134* 138  K 5.0 4.7  CL 106 108  CO2 20* 22  GLUCOSE 126* 127*  BUN 46* 38*  CREATININE 2.19* 2.04*  CALCIUM 9.2 9.0     Intake/Output Summary (Last 24 hours) at 03/18/2022 1241 Last data filed at 03/18/2022 0900 Gross per 24 hour  Intake 954 ml  Output 1375 ml  Net -421 ml        Physical Exam: Vital Signs Blood pressure (!) 119/49, pulse 61, temperature 98.2 F (36.8 C), resp. rate 17, height 5\' 9"  (1.753 m), weight 83.8 kg, SpO2 98 %.   General: Not in acute distress.  Heart: Regular rate. + Murmur. Lungs: Unlabored breathing, clear to auscultation, no wheezes or rales. Abdomen: +BS all 4 quads, soft, non-tender to palpation, non-distended. Extremities: No edema, clubbing, or cyanosis. Skin: No rash or skin breakdown. Neurologic: Awake and alert Motor: 5/5 bilateral deltoid, bicep, tricep, grip, hip flexor, knee extensors, ankle and plantar dorsiflexion. Cerebellar exam: LUE ataxia improving. Musculoskeletal: FROM all extremities.  No joint swelling present. Psych: Appropriate mood/affect.  PE: Unchanged from 03/17/22  Assessment/Plan: 1. Functional deficits which require 3+ hours per day of interdisciplinary therapy in a comprehensive inpatient rehab setting. Physiatrist is providing close team supervision and 24 hour management of active medical problems listed below. Physiatrist and rehab team continue to assess barriers to discharge/monitor patient progress toward functional and medical goals  Care Tool:  Bathing   Bathing activity did not occur: Safety/medical concerns Body parts bathed by patient: Right arm, Left arm, Chest, Abdomen, Face, Front perineal area, Right upper leg, Left upper leg, Right lower leg, Left lower leg   Body parts bathed by helper: Buttocks     Bathing assist Assist Level: Minimal Assistance - Patient > 75%     Upper Body Dressing/Undressing Upper body dressing Upper body dressing/undressing activity did not occur (including orthotics): Safety/medical concerns What is the patient wearing?: Pull over shirt    Upper body assist Assist Level: Supervision/Verbal cueing    Lower Body Dressing/Undressing Lower body dressing    Lower body dressing activity did not occur: Safety/medical concerns What is the patient wearing?: Incontinence brief, Pants     Lower body assist Assist for lower body dressing: Minimal Assistance - Patient > 75%     Toileting Toileting    Toileting assist Assist for toileting: Moderate Assistance - Patient 50 - 74% (stedy)     Transfers Chair/bed transfer  Transfers assist  Chair/bed transfer activity did not occur: Safety/medical concerns  Chair/bed transfer assist level: Minimal Assistance - Patient > 75% Chair/bed transfer assistive device: Armrests, Geologist, engineering   Ambulation assist   Ambulation activity did not occur: Safety/medical concerns  Assist level: Minimal Assistance - Patient > 75% Assistive device: Walker-rolling (weighted RW) Max distance: 74ft   Walk 10 feet activity   Assist  Walk 10 feet activity did not occur: Safety/medical concerns        Walk 50 feet activity   Assist Walk 50 feet with 2 turns activity did not occur: Safety/medical concerns         Walk 150 feet activity   Assist Walk 150 feet activity did not occur: Safety/medical concerns         Walk 10 feet on uneven surface  activity   Assist Walk 10 feet on uneven surfaces activity did not occur:  Safety/medical concerns         Wheelchair     Assist Is the patient using a wheelchair?: Yes Type of Wheelchair: Manual Wheelchair activity did not occur: Safety/medical concerns         Wheelchair 50 feet with 2 turns activity    Assist    Wheelchair 50 feet with 2 turns activity did not occur: Safety/medical concerns       Wheelchair 150 feet activity     Assist  Wheelchair 150 feet activity did not occur: Safety/medical concerns       Blood pressure (!) 119/49, pulse 61, temperature 98.2 F (36.8 C), resp. rate 17, height  (1.753 m), weight 83.8 kg, SpO2 98 %.  Medical Problem List and Plan: 1. 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  Antithrombotics: -DVT/anticoagulation:  Pharmaceutical: Heparin initiated 02/12/2022             -  antiplatelet therapy: N/A 3. Pain Management: Tylenol as needed 4. Decreased arousal as well as agitation improved :  Improved d/c amantadine, d/c zyprexa    5. Neuropsych: This patient is capable of making medical decisions on his own behalf. Will ask Neuropsych to help delineate his cognitive issues and capacity SLP has noted improvement in cognition as well  TeleSitter for safety 4/22 TeleSitter use continued for safety. 6. Skin/Wound Care: Routine skin checks 7. Fluids/Electrolytes/Nutrition: Routine in and outs 8.  Postop large right-sided pleural effusion.  Status postthoracentesis with 1250 cc yield.  Latest chest x-ray unremarkable 9.  Seizure prophylaxis.  Keppra 500 mg twice daily completed 10.  Atrial fibrillation.  Eliquis currently on hold.  Neurology considering resuming in approximately 4 weeks.  Cardiac rate controlled 11.  Diabetes  mellitus with hyperglycemia.  Hemoglobin A1c 6.2.  CBG (last 3)  Recent Labs    03/17/22 2102 03/18/22 0611 03/18/22 1147  GLUCAP 181* 139* 178*  Semglee to 20U   Controlled 4/21 -4/22 controlled with 20U Semglee, continue current regimen -4/23 Continue Semglee 20U 12.  Chronic anemia.    Hemoglobin 7.9 on 4/12, relatively stable, labs ordered for Monday           -4/22 No new labs pending CBC for 4/24  13  Hypertension.  Continue Coreg 3.125 mg twice daily.  Monitor with increased mobility           Vitals:   03/17/22 1937 03/18/22 0403  BP: 123/78 (!) 119/49  Pulse: 74 61  Resp: 18 17  Temp: 97.9 F (36.6 C) 98.2 F (36.8 C)  SpO2: 97% 98%   Running low with bradycardia , d/c coreg   4/22 HR 60-70's following d/c of Coreg, B/P fairly well controlled with with aberrant spike in b/p this afternoon.  Continue to trend and monitor. -4/23 HR today 60's-70's. 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  improved , intake much better    15. UTI with <100,000 E Coli. Keflex started. Sens to Keflex finished 7-day treatment but repeat UA showed + nitrite, >50WBC, many bact Restart Keflex pending cx -Keflex 250 mg BID initiated on 03/16/22, continue treatment, culture still pending -4/22 Pt is asymptomatic for urinary symptoms, continue to monitor -4/23 C& S resulted back E. Coli, pan sensitive, per Nephrology no treatment indicated. D/c'ed curent order for Keflex.   16.  Anemia - hx of myelodysplastic d/o sees hematologist Dr Alicia Amel at [facility]     Latest Ref Rng & Units 03/12/2022    5:13 AM 03/07/2022    9:59 AM 02/27/2022    5:20 AM  CBC  WBC 4.0 - 10.5 K/uL 7.4   7.8   10.5    Hemoglobin 13.0 - 17.0 g/dL 7.8   7.9   8.0    Hematocrit 39.0 - 52.0 % 25.3   25.5   24.7    Platelets 150 - 400 K/uL 274   235   342     17.  CKD stage III  Creatinine 1.69 on 4/4, up to 2.09, increase free H20, no nephro toxic meds, will give IVF bolus   4/22 BUN and Crt trending down from past 2  days: BUN 60>>46 and Crt 2.68>>2.19.   -Per nephrology, monitor daily weights, I/O's, maintain MAP >65 for optimal renal perfusion, avoid NSAID's or contrast dye. -Beta blocker was discontinued to ensure that "soft BP's" were not adversely affecting renal perfusion. -Continue to monitor B/P. -Renal US showed mildly atrophic kidneys but with  no hydronephrosis -UPC 0.22, UA following initial 7 day course of Keflex shows leukocytes, and lots of bacteria, pt is asymptomatic. -4/22 pending C & S, daily BMET x3. -4/23 Pan sens E. Coli, per nephrology no treatment indicated, continue daily BMET until 4/24.  -BUN and Crt trending down: BUN 60>>46>38 (4/23) and Crt 2.68>>2.19>2.04 (4/23)       Latest Ref Rng & Units 03/18/2022    6:16 AM 03/17/2022    5:35 AM 03/15/2022   10:02 AM  BMP  Glucose 70 - 99 mg/dL 161   096   045    BUN 8 - 23 mg/dL 38   46   60    Creatinine 0.61 - 1.24 mg/dL 4.09   8.11   9.14    Sodium 135 - 145 mmol/L 138   134   135    Potassium 3.5 - 5.1 mmol/L 4.7   5.0   5.1    Chloride 98 - 111 mmol/L 108   106   104    CO2 22 - 32 mmol/L Calcium 8.9 - 10.3 mg/dL 9.0   9.2   9.3      LOS: 20 days A FACE TO FACE EVALUATION WAS PERFORMED  Tressia Miners 03/18/2022, 12:41 PM

## 2022-03-18 NOTE — Progress Notes (Signed)
?[facility] ?Progress Note  ? ?76 y.o. 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 ?Assessment/ Plan:   ?AKI on CKD 3a ?Cr 2.68 <2.18 <2.06. Baseline appears to be ~1.3-1.42. GFR 24 ?UA with nitrite positive, large leuks, >50WBC, many bacteria  ?UOP 1.950 L yesterday. Received 500cc 0.45 NS bolus today  ?Unclear cause of kidney injury but appears to be hemodynamically mediated and he may be very sensitive to changes in BP. He appears euvolemic on exam, without any edema, JVD, or other signs of volume overload.  He also  has been urinating well with no obstructive symptoms.  Although UA indicating infection, he is asymptomatic without dysuria, urgency, frequency or hematuria.  No feeling of abdominal fullness after urinating.  No recent Foley. I also do not see any nephrotoxic agents like NSAID's or contrast.  ? ?Interestingly he had an episode of AKI earlier in the hospitalization prior to transfer to [facility] that resolved with his cr peaking at 2.03 before slowly returning to the 1.3-1.45 range. ?  ?- Renal ultrasound showed mildly atrophic kidneys but no hydronephrosis. ?- UPC 0.22, repeat urinalysis shows still WBC's and lots of bacteria -> culture sent. He is asymptomatic. ?- Wonder if his "soft" bp's may be causing decreased renal perfusion; agree with discontinuing the bblr. Fortunately renal function improved but likely will continue to fluctuate. Interstitial nephritis lower on the differential but if cultures are negative then certainly possible if leukocyturia persists. ? ?Fortunately renal function is improving, likely very sensitive to volume status/ blood pressure changes.   ? ?-Maintain MAP>65 for optimal renal perfusion.  ?-Avoid nephrotoxic medications including NSAIDs, contrast if possible ? ?Signing off at this time; please reconsult as needed. ? ? 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 Follows with CIR ?A fib- Eliquis on hold. Will follow up with Neurology outpatient  ?Moderate aortic valve stenosis  ?DM2- A1c 6.2. On Semglee 10u daily and mSSI ?HLD- Simvastatin '40mg'$  daily ?GERD- Protonix '40mg'$  daily ?HTN- BP soft. Currently 102/60. 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 monitored with CBCs  ? ?Subjective:   ?Denies f/c/n/v/sob/ obstructive symptoms.   ? ?Objective:   ?BP (!) 119/49 (BP Location: Left Arm)   Pulse 61   Temp 98.2 ?F (36.8 ?C)   Resp 17   Ht '5\' 9"'$  (1.753 m)   Wt 83.8 kg   SpO2 98%   BMI 27.28 kg/m?  ? ?Intake/Output Summary (Last 24 hours) at 03/18/2022 1321 ?Last data filed at 03/18/2022 1000 ?Gross per 24 hour  ?Intake 954 ml  ?Output 1675 ml  ?Net -721 ml  ? ?Weight change: 0 kg ? ?Physical Exam: ?General appearance: alert, pleasant, sitting up in the bec, NAD ?Head: [place]/AT. MMM ?Eyes: EOMI. Normal conjunctiva  ?Resp: CTAB normal WOB  ?CV: RRR. 3/6 systolic murmur  ?EL:FYBO, non distended, non tender ?Extremities: warm, dry. No LE edema  ? ?Imaging: ?US RENAL ? ?Result Date: 03/16/2022 ?CLINICAL DATA:  AKI. History of hypertension and diabetes. EXAM: RENAL / URINARY TRACT ULTRASOUND COMPLETE COMPARISON:  Abdominal ultrasound 01/19/2020 FINDINGS: Right Kidney: Renal measurements: 11.4 x 6.1 x 6.9 cm = volume: 272 mL. Diffuse mild  cortical thinning. Slight increased echogenicity. No solid mass or hydronephrosis visualized. There are 2 anechoic circumscribed cystic masses, the larger measuring 13.7 cm at the middle to lower third of the kidney with a single minimally thickened  smooth septations. This cystic mass is mildly increased in size compared to 01/19/2020. No new solid component or thickening of the single septation. An additional 4.1 cm simple cyst is seen at the upper third of the kidney not significantly changed compared to 01/19/2020. Left Kidney: Renal measurements: 11.9 x 6.4 x 6.1 cm = volume: 242 mL. Diffuse mild cortical thinning. Slight increased echogenicity. No solid mass or hydronephrosis visualized. Several anechoic circumscribed cystic cortical masses measuring up to 7.1 cm at the lower pole. Bladder: Appears normal for degree of bladder distention. Other: Visualized portions of the spleen are unremarkable. IMPRESSION: 1. Mildly atrophic kidneys consistent with changes of medical renal disease. No acute abnormality of the kidneys. 2. A minimally complex cyst at the right kidney is mildly increased in size compared to 01/19/2020 without interval development of abnormal calcification or solid component. Recommend follow-up in 1 year. 3. Other benign simple cortical renal cysts are noted bilaterally. 4. Normal appearance of the urinary bladder. Electronically Signed   By: Ileana Roup M.D.   On: 03/16/2022 17:12   ? ?Labs: ?BMET ?Recent Labs  ?Lab 03/12/22 ?1540 03/13/22 ?0867 03/15/22 ?1002 03/17/22 ?6195 03/18/22 ?0932  ?NA 137 137 135 134* 138  ?K 5.3* 5.2* 5.1 5.0 4.7  ?CL 106 102 104 106 108  ?CO2 '24 24 24 '$ 20* 22  ?GLUCOSE 168* 90 207* 126* 127*  ?BUN 54* 54* 60* 46* 38*  ?CREATININE 2.06* 2.18* 2.68* 2.19* 2.04*  ?CALCIUM 9.1 9.1 9.3 9.2 9.0  ? ?CBC ?Recent Labs  ?Lab 03/12/22 ?6712  ?WBC 7.4  ?NEUTROABS 5.1  ?HGB 7.8*  ?HCT 25.3*  ?MCV 92.7  ?PLT 274  ? ? ?Medications:   ? ? (feeding supplement) PROSource Plus  30 mL Oral BID BM  ? feeding supplement  237 mL Oral BID BM  ? heparin injection (subcutaneous)  5,000 Units Subcutaneous Q8H  ? insulin aspart  0-15 Units Subcutaneous TID AC & HS  ? insulin glargine-yfgn  10 Units Subcutaneous Daily  ? mouth rinse  15 mL  Mouth Rinse BID  ? megestrol  400 mg Oral BID  ? pantoprazole  40 mg Oral Daily  ? polyethylene glycol  17 g Oral Daily  ? senna-docusate  1 tablet Oral BID  ? ? ? ? ?Otelia Santee, MD ?03/18/2022, 1:21 PM  ? ?

## 2022-03-19 LAB — GLUCOSE, CAPILLARY
Glucose-Capillary: 168 mg/dL — ABNORMAL HIGH (ref 70–99)
Glucose-Capillary: 168 mg/dL — ABNORMAL HIGH (ref 70–99)
Glucose-Capillary: 213 mg/dL — ABNORMAL HIGH (ref 70–99)
Glucose-Capillary: 183 mg/dL — ABNORMAL HIGH (ref 70–99)

## 2022-03-19 LAB — BASIC METABOLIC PANEL
Anion gap: 7 (ref 5–15)
BUN: 36 mg/dL — ABNORMAL HIGH (ref 8–23)
CO2: 22 mmol/L (ref 22–32)
Calcium: 9 mg/dL (ref 8.9–10.3)
Chloride: 110 mmol/L (ref 98–111)
Creatinine, Ser: 2.1 mg/dL — ABNORMAL HIGH (ref 0.61–1.24)
GFR, Estimated: 32 mL/min — ABNORMAL LOW (ref >60)
Glucose, Bld: 150 mg/dL — ABNORMAL HIGH (ref 70–99)
Potassium: 4.9 mmol/L (ref 3.5–5.1)
Sodium: 139 mmol/L (ref 135–145)

## 2022-03-19 MED ORDER — SODIUM CHLORIDE 0.9 % IV BOLUS
500.0000 mL | Freq: Once | INTRAVENOUS | Status: AC
  Administered 2022-03-19: 500 mL via INTRAVENOUS

## 2022-03-19 NOTE — Progress Notes (Signed)
Patient ID: Vernon [name], male   DOB: August 01, 1946, 76 y.o.   MRN: [id number] ? ?Wheelchair and Bedside Commode ordered through [employer]  ?

## 2022-03-19 NOTE — Progress Notes (Signed)
?                                                       PROGRESS NOTE ? ? ?Subjective/Complaints: ? ?Appreciate nephro consult , some improvement in BMET  ? ?ROS- Denies fever, N/V/D, CP, or SOB. ? ? ?Objective: ?  ?No results found. ?No results for input(s): WBC, HGB, HCT, PLT in the last 72 hours. ? ? ?Recent Labs  ?  03/18/22 ?0616 03/19/22 ?0617  ?NA 138 139  ?K 4.7 4.9  ?CL 108 110  ?CO2 22 22  ?GLUCOSE 127* 150*  ?BUN 38* 36*  ?CREATININE 2.04* 2.10*  ?CALCIUM 9.0 9.0  ? ? ? ? ?Intake/Output Summary (Last 24 hours) at 03/19/2022 1031 ?Last data filed at 03/19/2022 0700 ?Gross per 24 hour  ?Intake 834 ml  ?Output 1450 ml  ?Net -616 ml  ? ?  ? ?  ? ?Physical Exam: ?Vital Signs ?Blood pressure (!) 105/53, pulse 63, temperature 98.5 ?F (36.9 ?C), temperature source Oral, resp. rate 18, height '5\' 9"'$  (1.753 m), weight 83.3 kg, SpO2 100 %. ? ? ?General: Not in acute distress.  ?Heart: Regular rate. + Murmur. ?Lungs: Unlabored breathing, clear to auscultation, no wheezes or rales. ?Abdomen: +BS all 4 quads, soft, non-tender to palpation, non-distended. ?Extremities: No edema, clubbing, or cyanosis. ?Skin: No rash or skin breakdown. ?Neurologic: Awake and alert ?Motor: 5/5 bilateral deltoid, bicep, tricep, grip, hip flexor, knee extensors, ankle and plantar dorsiflexion. ?Cerebellar exam: LUE ataxia improving. ?Musculoskeletal: FROM all extremities.  No joint swelling present. ?Psych: Appropriate mood/affect. ? ?PE: Unchanged from 03/17/22 ? ?Assessment/Plan: ?1. Functional deficits which require 3+ hours per day of interdisciplinary therapy in a comprehensive inpatient rehab setting. ?Physiatrist is providing close team supervision and 24 hour management of active medical problems listed below. ?Physiatrist and rehab team continue to assess barriers to discharge/monitor patient progress toward functional and medical goals ? ?Care Tool: ? ?Bathing ? Bathing activity did not occur: Safety/medical concerns ?Body parts  bathed by patient: Right arm, Left arm, Chest, Abdomen, Face, Front perineal area, Right upper leg, Left upper leg, Right lower leg, Left lower leg  ? Body parts bathed by helper: Buttocks ?  ?  ?Bathing assist Assist Level: Minimal Assistance - Patient > 75% ?  ?  ?Upper Body Dressing/Undressing ?Upper body dressing Upper body dressing/undressing activity did not occur (including orthotics): Safety/medical concerns ?What is the patient wearing?: Pull over shirt ?   ?Upper body assist Assist Level: Supervision/Verbal cueing ?   ?Lower Body Dressing/Undressing ?Lower body dressing ? ? ? Lower body dressing activity did not occur: Safety/medical concerns ?What is the patient wearing?: Pants ? ?  ? ?Lower body assist Assist for lower body dressing: Minimal Assistance - Patient > 75% ?   ? ?Toileting ?Toileting    ?Toileting assist Assist for toileting: Moderate Assistance - Patient 50 - 74% (stedy) ?  ?  ?Transfers ?Chair/bed transfer ? ?Transfers assist ? Chair/bed transfer activity did not occur: Safety/medical concerns ? ?Chair/bed transfer assist level: Minimal Assistance - Patient > 75% ?Chair/bed transfer assistive device: Armrests, Walker ?  ?Locomotion ?Ambulation ? ? ?Ambulation assist ? ? Ambulation activity did not occur: Safety/medical concerns ? ?Assist level: Minimal Assistance - Patient > 75% ?Assistive device: Walker-rolling (weighted RW) ?Max distance: 32f  ? ?Walk 10 feet  activity ? ? ?Assist ? Walk 10 feet activity did not occur: Safety/medical concerns ? ?  ?   ? ?Walk 50 feet activity ? ? ?Assist Walk 50 feet with 2 turns activity did not occur: Safety/medical concerns ? ?  ?   ? ? ?Walk 150 feet activity ? ? ?Assist Walk 150 feet activity did not occur: Safety/medical concerns ? ?  ?  ?  ? ?Walk 10 feet on uneven surface  ?activity ? ? ?Assist Walk 10 feet on uneven surfaces activity did not occur: Safety/medical concerns ? ? ?  ?   ? ?Wheelchair ? ? ? ? ?Assist Is the patient using a  wheelchair?: Yes ?Type of Wheelchair: Manual ?Wheelchair activity did not occur: Safety/medical concerns ? ?  ?   ? ? ?Wheelchair 50 feet with 2 turns activity ? ? ? ?Assist ? ?  ?Wheelchair 50 feet with 2 turns activity did not occur: Safety/medical concerns ? ? ?   ? ?Wheelchair 150 feet activity  ? ? ? ?Assist ? Wheelchair 150 feet activity did not occur: Safety/medical concerns ? ? ?   ? ?Blood pressure (!) 105/53, pulse 63, temperature 98.5 ?F (36.9 ?C), temperature source Oral, resp. rate 18, height '5\' 9"'$  (1.753 m), weight 83.3 kg, SpO2 100 %. ? ?Medical Problem List and Plan: ?1. 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 Continue CIR, 4/25 d/c date ?Pt will f/u with PCP Dr Edwina Barth at [facility]  ?Will need f/u with Nephro as well  ?2.  Antithrombotics: ?-DVT/anticoagulation:  Pharmaceutical: Heparin initiated 02/12/2022 ?            -antiplatelet therapy: N/A ?3. Pain Management: Tylenol as needed ?4. Decreased arousal as well as agitation improved :  ?Improved d/c amantadine, d/c zyprexa   ? ?5. Neuropsych: This patient is capable of making medical decisions on his own behalf. ?Will ask Neuropsych to help delineate his cognitive issues and capacity ?SLP has noted improvement in cognition as well ? TeleSitter for safety ?4/22 TeleSitter use continued for safety. ?6. Skin/Wound Care: Routine skin checks ?7. Fluids/Electrolytes/Nutrition: Routine in and outs ?8.  Postop large right-sided pleural effusion.  Status postthoracentesis with 1250 cc yield.  Latest chest x-ray unremarkable ?9.  Seizure prophylaxis.  Keppra 500 mg twice daily completed ?10.  Atrial fibrillation.  Eliquis currently on hold.  Neurology considering resuming in approximately 4 weeks.  Cardiac rate controlled ?11.  Diabetes mellitus with  hyperglycemia.  Hemoglobin A1c 6.2.  CBG (last 3)  ?Recent Labs  ?  03/18/22 ?1645 03/18/22 ?2111 03/19/22 ?4970  ?GLUCAP 196* 183* 168*  ? ?Semglee to 10U  ? D/C home on this dose  ?12.  Chronic anemia.   ? Hemoglobin 7.9 on 4/12, relatively stable, labs ordered for Monday ?          -4/22 No new labs pending CBC for 4/24 ? ?13  Hypertension.  Continue Coreg 3.125 mg twice daily.  Monitor with increased mobility ?          ?Vitals:  ? 03/18/22 1908 03/19/22 0405  ?BP: 122/72 (!) 105/53  ?Pulse: 75 63  ?Resp: 18 18  ?Temp: 98.6 ?F (37 ?C) 98.5 ?F (36.9 ?C)  ?SpO2: 98% 100%  ? Running low with bradycardia , off  coreg x 3d, will repeat fluid bolus with .9NS today , can d/c IV in am ? ? ?  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  improved , intake much better  ?  ?15. UTI with <100,000 E Coli. Keflex started. Sens to Keflex finished 7-day treatment but repeat UA showed + nitrite, >50WBC, many bact ?Restart Keflex pending cx ?-Keflex 250 mg BID initiated on 03/16/22, continue treatment, culture still pending ?-4/22 Pt is asymptomatic for urinary symptoms, continue to monitor ?-4/23 C& S resulted back E. Coli, pan sensitive, per Nephrology no treatment indicated. D/c'ed curent order for Keflex. ? ? 16.  Anemia - hx of myelodysplastic d/o sees hematologist Dr Dorthula Perfect at [facility]  ? ?  Latest Ref Rng & Units 03/12/2022  ?  5:13 AM 03/07/2022  ?  9:59 AM 02/27/2022  ?  5:20 AM  ?CBC  ?WBC 4.0 - 10.5 K/uL 7.4   7.8   10.5    ?Hemoglobin 13.0 - 17.0 g/dL 7.8   7.9   8.0    ?Hematocrit 39.0 - 52.0 % 25.3   25.5   24.7    ?Platelets 150 - 400 K/uL 274   235   342    ? ?17.  CKD stage III ? ?-4/23 Pan sens E. Coli, per nephrology no treatment indicated, continue daily BMET until 4/24.  ?-BUN and Crt trending down: BUN 60>>46>38 (4/23) and Crt 2.68>>2.19>2.04 (4/23) ? ? ? ? ?  Latest Ref Rng & Units 03/19/2022  ?  6:17 AM 03/18/2022  ?  6:16 AM 03/17/2022  ?  5:35 AM  ?BMP  ?Glucose 70 - 99 mg/dL 150   127   126    ?BUN 8 - 23 mg/dL 36    38   46    ?Creatinine 0.61 - 1.24 mg/dL 2.10   2.04   2.19    ?Sodium 135 - 145 mmol/L 139   138   134    ?Potassium 3.5 - 5.1 mmol/L 4.9   4.7   5.0    ?Chloride 98 - 111 mmol/L 110   108   106    ?CO2 22 - 32 mmol/L 22   22

## 2022-03-19 NOTE — Plan of Care (Signed)
?  Problem: RH Swallowing ?Goal: LTG Patient will consume least restrictive diet using compensatory strategies with assistance (SLP) ?Description: LTG:  Patient will consume least restrictive diet using compensatory strategies with assistance (SLP) ?Outcome: Completed/Met ?Goal: LTG Pt will demonstrate functional change in swallow as evidenced by bedside/clinical objective assessment (SLP) ?Description: LTG: Patient will demonstrate functional change in swallow as evidenced by bedside/clinical objective assessment (SLP) ?Outcome: Completed/Met ?  ?Problem: RH Cognition - SLP ?Goal: RH LTG Patient will demonstrate orientation with cues ?Description:  LTG:  Patient will demonstrate orientation to person/place/time/situation with cues (SLP)   ?Outcome: Completed/Met ?  ?Problem: RH Problem Solving ?Goal: LTG Patient will demonstrate problem solving for (SLP) ?Description: LTG:  Patient will demonstrate problem solving for basic/complex daily situations with cues  (SLP) ?Outcome: Completed/Met ?  ?Problem: RH Memory ?Goal: LTG Patient will use memory compensatory aids to (SLP) ?Description: LTG:  Patient will use memory compensatory aids to recall biographical/new, daily complex information with cues (SLP) ?Outcome: Completed/Met ?  ?Problem: RH Attention ?Goal: LTG Patient will demonstrate this level of attention during functional activites (SLP) ?Description: LTG:  Patient will will demonstrate this level of attention during functional activites (SLP) ?Outcome: Completed/Met ?  ?Problem: RH Memory ?Goal: LTG Patient will follow step by step directions w/cues (SLP) ?Description: LTG: Patient will follow step by step directions with cues (SLP). ?Outcome: Not Applicable ?Note: Did not explicitly address ?  ?

## 2022-03-19 NOTE — Progress Notes (Signed)
Palliative: ? ?I met today with Vernon [name] [name] not yet to bedside). He is much more awake, alert, interactive! Much less confused than previous visits! He is excited to be going home (especially to have a good hot breakfast with bacon he notes). He has no concerns and although he does not recall his previous state he acknowledges that everyone keeps telling him that he is doing much better. He is pleased with his progress. We discussed the challenges of taking time and listening to his body not to push too hard and too fast when he gets home as he is used to staying busy and accomplishing things as they arise. Emotional support provided.  ? ?Courtesy visit. No charge. ? ?Vinie Sill, NP ?Palliative Medicine Team ?Pager [phone] (Please see [email or web address] for schedule) ?Team Phone [phone]  ?

## 2022-03-19 NOTE — Progress Notes (Signed)
Occupational Therapy Session Note ? ?Patient Details  ?Name: Vernon [name] ?MRN: [id number] ?Date of Birth: 1946/04/21 ? ?Today's Date: 03/19/2022 ?OT Individual Time: 2694-8546 ?OT Individual Time Calculation (min): 54 min  ? ? ?Short Term Goals: ?Week 3:  OT Short Term Goal 1 (Week 3): STG = LTG 2/2 ELOS ? ?Skilled Therapeutic Interventions/Progress Updates:  ?Skilled OT intervention completed with focus on family education with pt's wife present. Emphasis of session regarding shower transfers, bathroom DME (3 in 1, shower chair), safety management. Pt received seated in recliner, no c/o pain. Therapist educated pt's wife about 3 in 1 installation, recommendations for set up, and toilet safety rails if wishing to use BSC in room and toilet in bathroom. Discussed pt's shower set up, with pt's wife showing picture and therapist offering recommendations of removing shower doors (which wife reports she plans to do) and then option of putting shower chair backed up against the wall in between the two corner seats vs pt sitting on angled seat 2/2 safety and instability. Advised both that the suction grab bars are to be avoided 2/2 to slip tendency, with suggestion of having grab bars installed once pt determines how he will transfer into shower at home. With simulated step via quad cane, and bench backed up to wall, therapist demonstrated entrance method for side stepping towards pt's strong R side, with RW/grab bar and turning to sit, with pt able to return demonstrate entrance with CGA only for balance. Cues needed for safety and positioning. Educated on technique for exiting on R strong side as well with min guard needed for standing via pulling up on R side grab bar then side stepping with R had on RW for stepping out at [place]. Therapist provided handout for pt's wife to access shower chair option with back/arm rests for out of pocket purchase to determine if it would fit prior to purchasing. Advised that the two wait  until [place] comes in to assess shower safety prior to attempting on their own despite CGA level 2/2 to new method at home. Pt was left upright in recliner, with wife, and MD present at therapist departure with all immediate needs met. ? ? ?Therapy Documentation ?Precautions:  ?Precautions ?Precautions: Fall ?Precaution Comments: [name], L weakness, L coordination deficit ?Restrictions ?Weight Bearing Restrictions: No ? ? ? ?Therapy/Group: Individual Therapy ? ?Vernon [name] ?03/19/2022, 7:55 AM ?

## 2022-03-19 NOTE — Progress Notes (Signed)
Physical Therapy Discharge Summary ? ?Patient Details  ?Name: Vernon [name] ?MRN: [id number] ?Date of Birth: 1946-06-22 ? ?Today's Date: 03/19/2022 ?PT Individual Time: 5462-7035 ?PT Individual Time Calculation (min): 65 min  ? ? ?Patient has met 9 of 10 long term goals due to improved activity tolerance, improved balance, improved postural control, increased strength, functional use of  left upper extremity and left lower extremity, improved attention, improved awareness, and improved coordination.  Patient to discharge at an ambulatory level [place].   Patient's care partner is independent to provide the necessary physical and cognitive assistance at discharge. ? ?Reasons goals not met: Pt has been making significant progress over past week and has progressed well with standing balance, but at time of d/c continues to require up to [facility] when fatigued d/t continued mild ataxia in L hemibody.  ? ?Recommendation:  ?Patient will benefit from ongoing skilled PT services in home health setting to continue to advance safe functional mobility, address ongoing impairments in strength, coordination, balance, activity tolerance, cognition, safety awareness, and to minimize fall risk. ? ?Equipment: ?18x16" w/c ? ?Reasons for discharge: treatment goals met and discharge from hospital ? ?Patient/family agrees with progress made and goals achieved: Yes ? ?PT Discharge ?Precautions/Restrictions ?Precautions ?Precautions: Fall ?Precaution Comments: L weakness, L coordination deficit ?Restrictions ?Weight Bearing Restrictions: No ?Vital Signs ?Therapy Vitals ?Temp: (!) 97.4 ?F (36.3 ?C) ?Temp Source: Oral ?Pulse Rate: 76 ?Resp: 17 ?BP: 135/68 ?Patient Position (if appropriate): Sitting ?Oxygen Therapy ?SpO2: 99 % ?O2 Device: Room Air ?Pain ?Pain Assessment ?Pain Scale: 0-10 ?Pain Score: 0-No pain ?Pain Interference ?Pain Interference ?Pain Effect on Sleep: 1. Rarely or not at all ?Pain Interference with Therapy Activities: 1.  Rarely or not at all ?Pain Interference with Day-to-Day Activities: 1. Rarely or not at all ?Vision/Perception  ?Vision - History ?Ability to See in Adequate Light: 0 Adequate ?Vision - Assessment ?Eye Alignment: Within Functional Limits ?Ocular Range of Motion: Within Functional Limits ?Alignment/Gaze Preference: Within Defined Limits ?Tracking/Visual Pursuits: Able to track stimulus in all quads without difficulty ?Saccades: Within functional limits ?Convergence: Impaired - to be further tested in functional context ?Perception ?Perception: Impaired ?Inattention/Neglect: Does not attend to left side of body ?Praxis ?Praxis: Impaired ?Praxis Impairment Details: Motor planning  ?Cognition ?Overall Cognitive Status: Impaired/Different from baseline ?Arousal/Alertness: Awake/alert ?Orientation Level: Oriented X4 ?Year: 2023 ?Month: April ?Day of Week: Correct ?Attention: Focused;Sustained;Selective ?Focused Attention: Appears intact ?Sustained Attention: Appears intact ?Selective Attention: Appears intact ?Memory: Impaired ?Memory Impairment: Decreased recall of new information ?Awareness: Impaired ?Awareness Impairment: Emergent impairment;Anticipatory impairment ?Problem Solving: Impaired (basic problem solving grossly intact; higher level problem solving deficits) ?Problem Solving Impairment: Verbal complex ?Initiating: Impaired (improved from eval) ?Safety/Judgment: Impaired ?Comments: Cognitive status much improved from eval ?Sensation ?Sensation ?Light Touch: Appears Intact ?Hot/Cold: Appears Intact ?Proprioception: Impaired Detail ?Proprioception Impaired Details: Impaired LLE;Impaired LUE ?Coordination ?Gross Motor Movements are Fluid and Coordinated: No ?Fine Motor Movements are Fluid and Coordinated: No ?Coordination and Movement Description: L ataxia and mild HP ?Heel Shin Test: continued ataxic movements with LLE but improved overall with ability to perform albeit slowly and with intent; RLE slow but  WFL ?Motor  ?Motor ?Motor: Other (comment);Ataxia ?Motor - Discharge Observations: 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  ?Mobility ?Bed Mobility ?Bed Mobility: Supine to Sit;Sit to Supine ?Supine to Sit: Supervision/Verbal cueing ?Sit to Supine: Supervision/Verbal cueing ?Transfers ?Transfers: Sit to Stand;Stand to [employer] Transfers ?Sit to Stand: Contact Guard/Touching assist;Supervision/Verbal cueing ?Stand to Sit: Contact  Guard/Touching assist;Supervision/Verbal cueing ?Stand Pivot Transfers: Contact Guard/Touching assist;Minimal Assistance - Patient > 75% ?Stand Pivot Transfer Details: Verbal cues for precautions/safety;Verbal cues for safe use of DME/AE ?Squat Pivot Transfers: Contact Guard/Touching assist ?Transfer (Assistive device): Rolling walker ?Locomotion  ?Gait ?Ambulation: Yes ?Gait Assistance: Contact Guard/Touching assist;Minimal Assistance - Patient > 75% ?Assistive device: Rolling walker (weighted) ?Gait Assistance Details: Verbal cues for precautions/safety;Verbal cues for technique;Verbal cues for safe use of DME/AE ?Gait ?Gait: Yes ?Gait Pattern: Step-through pattern;Decreased step length - left;Ataxic;Trunk flexed ?Gait velocity: decreased ?Stairs / Additional Locomotion ?Stairs: Yes ?Stairs Assistance: Contact Guard/Touching assist ?Stair Management Technique: One rail Right;One rail Left;Sideways (Both hands to L HR to ascend and then descends to other handrail) ?Height of Stairs: 6 ?Wheelchair Mobility ?Wheelchair Mobility: Yes ?Wheelchair Assistance: Contact Guard/Touching assist ?Wheelchair Propulsion: Both upper extremities ?Wheelchair Parts Management: Needs assistance  ?Trunk/Postural Assessment  ?Cervical Assessment ?Cervical Assessment: Exceptions to [facility] (mild forward head) ?Thoracic Assessment ?Thoracic Assessment: Exceptions to [facility] (rounded shoulders) ?Lumbar Assessment ?Lumbar Assessment: Exceptions to [facility] (posterior pelvic tilt) ?Postural  Control ?Postural Control: Deficits on evaluation (occasional LOB due to L ataxia, heavy reliance on BUE support)  ?Balance ?Balance ?Balance Assessed: Yes ?Static Sitting Balance ?Static Sitting - Balance Support: Bilateral upper extremity supported;Feet supported ?Static Sitting - Level of Assistance: 5: Stand by assistance ?Dynamic Sitting Balance ?Dynamic Sitting - Balance Support: Bilateral upper extremity supported;Feet supported ?Dynamic Sitting - Level of Assistance: 5: Stand by assistance ?Static Standing Balance ?Static Standing - Balance Support: Bilateral upper extremity supported;During functional activity ?Static Standing - Level of Assistance: 5: Stand by assistance;4: Min assist ?Dynamic Standing Balance ?Dynamic Standing - Balance Support: Bilateral upper extremity supported;During functional activity ?Dynamic Standing - Level of Assistance: 4: Min assist ?Extremity Assessment  ?RUE Assessment ?RUE Assessment: Within Functional Limits ?LUE Assessment ?LUE Assessment: Exceptions to [facility] ?General Strength Comments: ataxia and mild HP ?LUE Body System: Neuro ?Brunstrum levels for arm and hand: Arm;Hand ?Brunstrum level for arm: Stage V Relative Independence from [employer] ?Brunstrum level for hand: Stage VI Isolated joint movements ?RLE Assessment ?RLE Assessment: Exceptions to [facility] ?RLE Strength ?Right Hip Flexion: 4/5 ?Right Hip Extension: 4/5 ?Right Hip ABduction: 4/5 ?Right Hip ADduction: 4/5 ?Right Knee Flexion: 4/5 ?Right Knee Extension: 4+/5 ?Right Ankle Dorsiflexion: 4-/5 ?Right Ankle Plantar Flexion: 4/5 ?LLE Assessment ?LLE Assessment: Exceptions to [facility] ?LLE Strength ?Left Hip Flexion: 4-/5 ?Left Hip Extension: 4/5 ?Left Hip ABduction: 4/5 ?Left Hip ADduction: 4-/5 ?Left Knee Flexion: 3+/5 ?Left Knee Extension: 4-/5 ?Left Ankle Dorsiflexion: 3+/5 ?Left Ankle Plantar Flexion: 4-/5 ? ? ?Skilled Therapeutic Interventions/Progress Updates: ?Patient seated in recliner on entrance to room. Wife present  for family education. Patient alert and agreeable to PT session. Wife with questions at start of session re: equipment/ DME and other specifics re: home setup and pt's abilities. Discussed available options for w/c an

## 2022-03-19 NOTE — Progress Notes (Signed)
Occupational Therapy Discharge Summary ? ?Patient Details  ?Name: Vernon [name] ?MRN: [id number] ?Date of Birth: 09-29-1946 ? ? ?Patient has met 13 of 13 long term goals due to improved activity tolerance, improved balance, postural control, ability to compensate for deficits, functional use of  LEFT upper and LEFT lower extremity, improved attention, improved awareness, and improved coordination.  Patient to discharge at overall  S to min A  level.  Patient's care partner requires assistance to provide the necessary physical and cognitive assistance at discharge.  Pt to DC at the below ADL/bathroom transfer performance level. Assist levels represent pt's most consistent performance when alert/participatory. Pt continues to be primarily limited by LUE/LLE/trunkal ataxia and impulsivity with mobility. Hands on family education completed with caregiver demonstrating good understanding of pt's current impairments and impact on BADL/IADL/mobility performance. Wife plans to hire assistance at home as she works full time out of the home. Pt and caregivers will benefit from continued [facility] OT to facilitate improved caregiver education, functional mobility, and occupational performance.  ? ? ?Reasons goals not met: NA ? ?Recommendation:  ?Patient will benefit from ongoing skilled OT services in home health setting to continue to advance functional skills in the area of BADL, iADL, and Reduce care partner burden. ? ?Equipment: ?Shower chair  , 3in1 ? ?Reasons for discharge: treatment goals met and discharge from hospital ? ?Patient/family agrees with progress made and goals achieved: Yes ? ?OT Discharge ?Precautions/Restrictions  ?Precautions ?Precautions: Fall ?Precaution Comments: L weakness, L coordination deficit ?Restrictions ?Weight Bearing Restrictions: No ? ?ADL ?ADL ?Eating: Set up ?Where Assessed-Eating: Chair ?Grooming: Supervision/safety ?Where Assessed-Grooming: Sitting at sink ?Upper Body Bathing:  Supervision/safety ?Where Assessed-Upper Body Bathing: Shower ?Lower Body Bathing: Minimal assistance ?Where Assessed-Lower Body Bathing: Shower ?Upper Body Dressing: Supervision/safety ?Where Assessed-Upper Body Dressing: Chair ?Lower Body Dressing: Minimal assistance ?Where Assessed-Lower Body Dressing: Edge of bed, Chair ?Toileting: Minimal assistance ?Where Assessed-Toileting: Toilet, Bedside Commode ?Toilet Transfer: Minimal assistance ?Toilet Transfer Method: Stand pivot ?Tub/Shower Transfer: Not assessed ?Walk-In Shower Transfer: Contact guard ?Walk-In Shower Transfer Method: Stand pivot ?Walk-In Shower Equipment: Civil engineer, contracting with back, Grab bars ?Vision ?Baseline Vision/History: 1 Wears glasses ?Patient Visual Report: Diplopia (double vision am only, improves with one eye closed per pt) ?Vision Assessment?: Yes ?Eye Alignment: Within Functional Limits ?Ocular Range of Motion: Within Functional Limits ?Alignment/Gaze Preference: Within Defined Limits ?Tracking/Visual Pursuits: Able to track stimulus in all quads without difficulty ?Saccades: Within functional limits ?Perception  ?Inattention/Neglect: Does not attend to left side of body ?Praxis ?Praxis: Impaired ?Praxis Impairment Details: Motor planning ?Cognition ?Cognition ?Overall Cognitive Status: Impaired/Different from baseline ?Arousal/Alertness: Awake/alert ?Orientation Level: Person;Place;Situation ?Person: Oriented ?Place: Oriented ?Situation: Oriented ?Memory: Impaired ?Memory Impairment: Decreased recall of new information ?Attention: Selective ?Selective Attention: Appears intact ?Awareness: Impaired ?Awareness Impairment: Emergent impairment;Anticipatory impairment ?Problem Solving: Appears intact ?Problem Solving Impairment: Verbal complex ?Safety/Judgment: Impaired ?Comments: Cognitive status much improved from eval, mild impuslivity with mobility and impaired insight into ataxia/mobility deficits ?Brief Interview for Mental Status  (BIMS) ?Repetition of Three Words (First Attempt): 3 ?Temporal Orientation: Year: Correct ?Temporal Orientation: Month: Accurate within 5 days ?Temporal Orientation: Day: Correct ?Recall: "Sock": Yes, no cue required ?Recall: "Blue": Yes, no cue required ?Recall: "Bed": Yes, no cue required ?BIMS Summary Score: 15 ?Sensation ?Sensation ?Light Touch: Appears Intact ?Hot/Cold: Appears Intact ?Proprioception: Impaired Detail ?Proprioception Impaired Details: Impaired LUE;Impaired LLE ?Stereognosis: Impaired Detail ?Coordination ?Gross Motor Movements are Fluid and Coordinated: No ?Fine Motor Movements are Fluid and Coordinated: No ?Coordination and Movement Description:  L ataxia and mild HP ?Finger Nose Finger Test: ataxic on L ?9 Hole Peg Test: RUE: 34 secs, LUE: 12mn and 23 secs ?Motor  ?Motor ?Motor: Other (comment);Ataxia ?Motor - Discharge Observations: L ataxia and mild HP ?Mobility  ?Bed Mobility ?Bed Mobility: Supine to Sit ?Supine to Sit: Supervision/Verbal cueing ?Transfers ?Sit to Stand: Contact Guard/Touching assist;Supervision/Verbal cueing ?Stand to Sit: Contact Guard/Touching assist;Supervision/Verbal cueing  ?Trunk/Postural Assessment  ?Cervical Assessment ?Cervical Assessment: Exceptions to [facility](mild [facility] head) ?Thoracic Assessment ?Thoracic Assessment: Exceptions to [facility]) ?Lumbar Assessment ?Lumbar Assessment: Exceptions to [facility](post[facility] tilt) ?Postural Control ?Postural Control: Deficits on evaluation (occasional LOB due to L ataxia, heavy reliance on BUE support)  ?Balance ?Balance ?Balance Assessed: Yes ?Static Sitting Balance ?Static Sitting - Balance Support: Bilateral upper extremity supported;Feet supported ?Static Sitting - Level of Assistance: 5: Stand by assistance ?Dynamic Sitting Balance ?Dynamic Sitting - Balance Support: Bilateral upper extremity supported;Feet supported ?Dynamic Sitting - Level of Assistance: 5: Stand by assistance ?Static Standing  Balance ?Static Standing - Balance Support: Bilateral upper extremity supported;During functional activity ?Static Standing - Level of Assistance: 5: Stand by assistance;4: Min assist ?Dynamic Standing Balance ?Dynamic Standing - Balance Support: Bilateral upper extremity supported;During functional activity ?Dynamic Standing - Level of Assistance: 4: Min assist ?Extremity/Trunk Assessment ?RUE Assessment ?RUE Assessment: Within Functional Limits ?LUE Assessment ?L[facility] Assessment: [facility]?General Strength Comments: ataxia and mild HP ?LUE Body System: Neuro ?Brunstrum levels for arm and hand: Arm;Hand ?Brunstrum level for arm: Stage V Relative Independence from [employer] ?Brunstrum level for hand: Stage VI Isolated joint movements ? ? ?RVolanda NapoleonMS, OTR/L ? ?03/19/2022, 5:28 PM ?

## 2022-03-19 NOTE — Plan of Care (Signed)
?  Problem: RH Balance ?Goal: LTG: Patient will maintain dynamic sitting balance (OT) ?Description: LTG:  Patient will maintain dynamic sitting balance with assistance during activities of daily living (OT) ?Outcome: Completed/Met ?Goal: LTG Patient will maintain dynamic standing with ADLs (OT) ?Description: LTG:  Patient will maintain dynamic standing balance with assist during activities of daily living (OT)  ?Outcome: Completed/Met ?  ?Problem: Sit to Stand ?Goal: LTG:  Patient will perform sit to stand in prep for activites of daily living with assistance level (OT) ?Description: LTG:  Patient will perform sit to stand in prep for activites of daily living with assistance level (OT) ?Outcome: Completed/Met ?  ?Problem: RH Grooming ?Goal: LTG Patient will perform grooming w/assist,cues/equip (OT) ?Description: LTG: Patient will perform grooming with assist, with/without cues using equipment (OT) ?Outcome: Completed/Met ?  ?Problem: RH Bathing ?Goal: LTG Patient will bathe all body parts with assist levels (OT) ?Description: LTG: Patient will bathe all body parts with assist levels (OT) ?Outcome: Completed/Met ?  ?Problem: RH Dressing ?Goal: LTG Patient will perform upper body dressing (OT) ?Description: LTG Patient will perform upper body dressing with assist, with/without cues (OT). ?Outcome: Completed/Met ?Goal: LTG Patient will perform lower body dressing w/assist (OT) ?Description: LTG: Patient will perform lower body dressing with assist, with/without cues in positioning using equipment (OT) ?Outcome: Completed/Met ?  ?Problem: RH Toileting ?Goal: LTG Patient will perform toileting task (3/3 steps) with assistance level (OT) ?Description: LTG: Patient will perform toileting task (3/3 steps) with assistance level (OT)  ?Outcome: Completed/Met ?  ?Problem: RH Functional Use of Upper Extremity ?Goal: LTG Patient will use RT/LT upper extremity as a (OT) ?Description: LTG: Patient will use right/left upper  extremity as a stabilizer/gross assist/diminished/nondominant/dominant level with assist, with/without cues during functional activity (OT) ?Outcome: Completed/Met ?  ?Problem: RH Toilet Transfers ?Goal: LTG Patient will perform toilet transfers w/assist (OT) ?Description: LTG: Patient will perform toilet transfers with assist, with/without cues using equipment (OT) ?Outcome: Completed/Met ?  ?Problem: RH Tub/Shower Transfers ?Goal: LTG Patient will perform tub/shower transfers w/assist (OT) ?Description: LTG: Patient will perform tub/shower transfers with assist, with/without cues using equipment (OT) ?Outcome: Completed/Met ?  ?Problem: RH Attention ?Goal: LTG Patient will demonstrate this level of attention during functional activites (OT) ?Description: LTG:  Patient will demonstrate this level of attention during functional activites  (OT) ?Outcome: Completed/Met ?  ?

## 2022-03-19 NOTE — Progress Notes (Signed)
Inpatient Rehabilitation Care Coordinator ?Discharge Note  ? ?Patient Details  ?Name: Vernon [name] ?MRN: [id number] ?Date of Birth: November 26, 1946 ? ? ?Discharge location: Home ? ?Length of Stay: 21 Days ? ?Discharge activity level: Sup/Min A ? ?Home/community participation: spouse [name]) ? ?Patient response XB:WIOMBT Literacy - How often do you need to have someone help you when you read instructions, pamphlets, or other written material from your doctor or pharmacy?: Never ? ?Patient response DH:RCBULA Isolation - How often do you feel lonely or isolated from those around you?: Never ? ?Services provided included: SW, Neuropsych, Pharmacy, TR, CM, RN, SLP, OT, PT, RD, MD ? ?Financial Services:  ?Charity fundraiser Utilized: Private Insurance ?[employer] ? ?Choices offered to/list presented to: spouse ? ?Follow-up services arranged:  ?Home Health ?[facility] Health Agency: [facility]  ?  ?  ?  ? ?Patient response to transportation need: ?Is the patient able to respond to transportation needs?: Yes ?In the past 12 months, has lack of transportation kept you from medical appointments or from getting medications?: No ?In the past 12 months, has lack of transportation kept you from meetings, work, or from getting things needed for daily living?: No ? ? ? ?Comments (or additional information): ? ?Patient/Family verbalized understanding of follow-up arrangements:  Yes ? ?Individual responsible for coordination of the follow-up plan: spouse ? ?Confirmed correct DME delivered: Dyanne Iha 03/19/2022   ? ?Dyanne Iha ?

## 2022-03-19 NOTE — Progress Notes (Signed)
Occupational Therapy Session Note ? ?Patient Details  ?Name: Vernon [name] ?MRN: [id number] ?Date of Birth: July 28, 1946 ? ?Today's Date: 03/19/2022 ?OT Individual Time: [id number] ?OT Individual Time Calculation (min): 44 min  ? ? ?Short Term Goals: ?Week 3:  OT Short Term Goal 1 (Week 3): STG = LTG 2/2 ELOS ? ?Skilled Therapeutic Interventions/Progress Updates:  ?Pt greeted supine in bed  agreeable to OT intervention. Session focus on BADL reeducation, functional mobility,  LUE coordination and decreasing overall caregiver burden.    ?Pt completed supine>sit with CGA, did note pt bumped his head on bed rail when transitioning from sidelying>sit, no sign of injury noted with no c/o pain. Pt completed dressing from EOB with supervision for UB dressing, and MIN A for LB dressing with pt needing assist to pull pants up to waist line on L side. Pt completed sit>stands throughout session with CGA and MIN A for stand pivot transfers with RW.        ?Remainder of session focus on LUE coordination. Pt completed 9 hole peg tests with results indicated below: ?RUE 34 secs ?LUE 1 min 23 secs ?Pt also utilized BITS to work on LUE GM/FM coordination. Pt instructed to reach of bos for stimulus on screen with RUE and LUE with results as indicated below: ? RUE 93.75% accuracy, 3 misses, slowest top  R, reaction time 1.33 seconds  ?LUE: 53.85% accuracy,  24 misses, slowest R upper,  reaction time  2 secs ? ?Education provided on compensatory methods for LUE ataxia such as supporting L elbow when working on [facility] tasks such as self feeding and grooming. Pt transported back to room with total A where pt left up in recliner with alarm belt activated and all needs within reach. ? ?Therapy Documentation ?Precautions:  ?Precautions ?Precautions: Fall ?Precaution Comments: [name], L weakness, L coordination deficit ?Restrictions ?Weight Bearing Restrictions: No ? ?Pain: no pain reported during session  ? ? ? ?Therapy/Group: Individual  Therapy ? ?Precious Haws ?03/19/2022, 9:04 AM ?

## 2022-03-19 NOTE — Progress Notes (Signed)
Speech Language Pathology Discharge Summary ? ?Patient Details  ?Name: Vernon [name] ?MRN: [id number] ?Date of Birth: 04/05/1946 ? ?Today's Date: 03/19/2022 ?SLP Individual Time: 1000-1100 ?SLP Individual Time Calculation (min): 60 min ? ?Skilled Therapeutic Interventions: Skilled ST treatment focused on cognitive and swallowing goals. Pt agreeable to regular PO trials with functional and efficient mastication, complete oral clearance post swallows, and no overt s/sx of aspiration. SLP recommends diet advancement to regular diet, thin liquids; pills whole with liquid and given one at a time. Pt verbalized agreement. Notified Nurse. Diet signs/staff communication and diet order updated to reflect advancement. SLP re-administered SLUMS cognitive-linguistic assessment. Pt scored 28/30 with score of 27 considered within the normal range. Pt scored a 1/24 on April 4 (score modified to omit writing portions), and 8/24 on March 27. Score this date, in addition to further informal assessment, is representative of significant improvement in all cognitive domains since admission to CIR. Pt presenting with mild deficits at this time in the areas of decreased short-term recall, complex problem solving, and emergent awareness. Recommend continuation of SLP services at next level of care for higher level cognitive deficits to enhance functional independence. Reviewed results with patient who is very pleased with progress. Patient was left in wheelchair with alarm activated and immediate needs within reach at end of session. Continue per current plan of care.   ? ?Patient has met 6 of 6 long term goals.  Patient to discharge at overall Supervision level.  ?Reasons goals not met: All goals met  ? ?Clinical Impression/Discharge Summary: Pt has demonstrated excellent progress towards swallowing, communication, and cognitive-linguistic goals meeting 6 out of 6 long-term goals this admission due to improved oropharyngeal swallow function,  speech intelligibility, orientation, attention, basic problem solving, functional recall, intellectual and emergent awareness. Patient is currently completing basic, functional cognitive tasks with supervision A cues. Patient is currently tolerating a regular diet and thin liquids and is mod I for implementation of safe swallow precautions and strategies. Patient and family education is complete and patient to discharge at overall supervision level. Patient's care partner is independent to provide the necessary physical and cognitive assistance at discharge. Patient would benefit from continued SLP services in home health setting to maximize cognitive function and functional independence.  ? ?Care Partner:  ?Caregiver Able to Provide Assistance: Yes  ?Type of Caregiver Assistance: Cognitive;Physical ? ?Recommendation:  ?24 hour supervision/assistance;Home Health SLP  ?Rationale for SLP Follow Up: Maximize cognitive function and independence;Reduce caregiver burden  ? ?Equipment: None  ? ?Reasons for discharge: Treatment goals met;Discharged from hospital  ? ?Patient/Family Agrees with Progress Made and Goals Achieved: Yes  ? ? ?Vernon [name] ?03/19/2022, 11:03 AM ? ?

## 2022-03-19 NOTE — Progress Notes (Signed)
?                                                       PROGRESS NOTE ? ? ?Subjective/Complaints: ? ?Appreciate nephro consult , some improvement in BMET  ? ?ROS- Denies fever, N/V/D, CP, or SOB. ? ? ?Objective: ?  ?No results found. ?No results for input(s): WBC, HGB, HCT, PLT in the last 72 hours. ? ? ?Recent Labs  ?  03/18/22 ?0616 03/19/22 ?0617  ?NA 138 139  ?K 4.7 4.9  ?CL 108 110  ?CO2 22 22  ?GLUCOSE 127* 150*  ?BUN 38* 36*  ?CREATININE 2.04* 2.10*  ?CALCIUM 9.0 9.0  ? ? ? ? ?Intake/Output Summary (Last 24 hours) at 03/19/2022 1209 ?Last data filed at 03/19/2022 0700 ?Gross per 24 hour  ?Intake 834 ml  ?Output 1450 ml  ?Net -616 ml  ? ?  ? ?  ? ?Physical Exam: ?Vital Signs ?Blood pressure (!) 105/53, pulse 63, temperature 98.5 ?F (36.9 ?C), temperature source Oral, resp. rate 18, height '5\' 9"'$  (1.753 m), weight 83.3 kg, SpO2 100 %. ? ? ?General: Not in acute distress.  ?Heart: Regular rate. + Murmur. ?Lungs: Unlabored breathing, clear to auscultation, no wheezes or rales. ?Abdomen: +BS all 4 quads, soft, non-tender to palpation, non-distended. ?Extremities: No edema, clubbing, or cyanosis. ?Skin: No rash or skin breakdown. ?Neurologic: Awake and alert ?Motor: 5/5 bilateral deltoid, bicep, tricep, grip, hip flexor, knee extensors, ankle and plantar dorsiflexion. ?Cerebellar exam: LUE ataxia improving. ?Musculoskeletal: FROM all extremities.  No joint swelling present. ?Psych: Appropriate mood/affect. ? ?PE: Unchanged from 03/17/22 ? ?Assessment/Plan: ?1. Functional deficits which require 3+ hours per day of interdisciplinary therapy in a comprehensive inpatient rehab setting. ?Physiatrist is providing close team supervision and 24 hour management of active medical problems listed below. ?Physiatrist and rehab team continue to assess barriers to discharge/monitor patient progress toward functional and medical goals ? ?Care Tool: ? ?Bathing ? Bathing activity did not occur: Safety/medical concerns ?Body parts  bathed by patient: Right arm, Left arm, Chest, Abdomen, Face, Front perineal area, Right upper leg, Left upper leg, Right lower leg, Left lower leg  ? Body parts bathed by helper: Buttocks ?  ?  ?Bathing assist Assist Level: Minimal Assistance - Patient > 75% ?  ?  ?Upper Body Dressing/Undressing ?Upper body dressing Upper body dressing/undressing activity did not occur (including orthotics): Safety/medical concerns ?What is the patient wearing?: Pull over shirt ?   ?Upper body assist Assist Level: Supervision/Verbal cueing ?   ?Lower Body Dressing/Undressing ?Lower body dressing ? ? ? Lower body dressing activity did not occur: Safety/medical concerns ?What is the patient wearing?: Pants ? ?  ? ?Lower body assist Assist for lower body dressing: Minimal Assistance - Patient > 75% ?   ? ?Toileting ?Toileting    ?Toileting assist Assist for toileting: Moderate Assistance - Patient 50 - 74% (stedy) ?  ?  ?Transfers ?Chair/bed transfer ? ?Transfers assist ? Chair/bed transfer activity did not occur: Safety/medical concerns ? ?Chair/bed transfer assist level: Minimal Assistance - Patient > 75% ?Chair/bed transfer assistive device: Armrests, Walker ?  ?Locomotion ?Ambulation ? ? ?Ambulation assist ? ? Ambulation activity did not occur: Safety/medical concerns ? ?Assist level: Minimal Assistance - Patient > 75% ?Assistive device: Walker-rolling (weighted RW) ?Max distance: 36f  ? ?Walk 10 feet  activity ? ? ?Assist ? Walk 10 feet activity did not occur: Safety/medical concerns ? ?  ?   ? ?Walk 50 feet activity ? ? ?Assist Walk 50 feet with 2 turns activity did not occur: Safety/medical concerns ? ?  ?   ? ? ?Walk 150 feet activity ? ? ?Assist Walk 150 feet activity did not occur: Safety/medical concerns ? ?  ?  ?  ? ?Walk 10 feet on uneven surface  ?activity ? ? ?Assist Walk 10 feet on uneven surfaces activity did not occur: Safety/medical concerns ? ? ?  ?   ? ?Wheelchair ? ? ? ? ?Assist Is the patient using a  wheelchair?: Yes ?Type of Wheelchair: Manual ?Wheelchair activity did not occur: Safety/medical concerns ? ?  ?   ? ? ?Wheelchair 50 feet with 2 turns activity ? ? ? ?Assist ? ?  ?Wheelchair 50 feet with 2 turns activity did not occur: Safety/medical concerns ? ? ?   ? ?Wheelchair 150 feet activity  ? ? ? ?Assist ? Wheelchair 150 feet activity did not occur: Safety/medical concerns ? ? ?   ? ?Blood pressure (!) 105/53, pulse 63, temperature 98.5 ?F (36.9 ?C), temperature source Oral, resp. rate 18, height '5\' 9"'$  (1.753 m), weight 83.3 kg, SpO2 100 %. ? ?Medical Problem List and Plan: ?1. 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 Continue CIR, 4/25 d/c date ?Pt will f/u with PCP Dr Edwina Barth at [facility]  ?Will need f/u with Nephro as well  ?2.  Antithrombotics: ?-DVT/anticoagulation:  Pharmaceutical: Heparin initiated 02/12/2022 ?            -antiplatelet therapy: N/A ?3. Pain Management: Tylenol as needed ?4. Decreased arousal as well as agitation improved :  ?Improved d/c amantadine, d/c zyprexa   ? ?5. Neuropsych: This patient is capable of making medical decisions on his own behalf. ?Will ask Neuropsych to help delineate his cognitive issues and capacity ?SLP has noted improvement in cognition as well ? TeleSitter for safety ?4/22 TeleSitter use continued for safety. ?6. Skin/Wound Care: Routine skin checks ?7. Fluids/Electrolytes/Nutrition: Routine in and outs ?8.  Postop large right-sided pleural effusion.  Status postthoracentesis with 1250 cc yield.  Latest chest x-ray unremarkable ?9.  Seizure prophylaxis.  Keppra 500 mg twice daily completed ?10.  Atrial fibrillation.  Eliquis currently on hold.  Neurology considering resuming in approximately 4 weeks.  Cardiac rate controlled ?11.  Diabetes mellitus with  hyperglycemia.  Hemoglobin A1c 6.2.  CBG (last 3)  ?Recent Labs  ?  03/18/22 ?2111 03/19/22 ?9892 03/19/22 ?1121  ?GLUCAP 183* 168* 168*  ? ?Semglee to 10U  ? D/C home on this dose  ?12.  Chronic anemia.   ? Hemoglobin 7.9 on 4/12, relatively stable, labs ordered for Monday ?          -4/22 No new labs pending CBC for 4/24 ? ?13  Hypertension.  Continue Coreg 3.125 mg twice daily.  Monitor with increased mobility ?          ?Vitals:  ? 03/18/22 1908 03/19/22 0405  ?BP: 122/72 (!) 105/53  ?Pulse: 75 63  ?Resp: 18 18  ?Temp: 98.6 ?F (37 ?C) 98.5 ?F (36.9 ?C)  ?SpO2: 98% 100%  ? Running low with bradycardia , off  coreg x 3d, will repeat fluid bolus with .9NS today , can d/c IV in am ? ? ?  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  improved , intake much better  ?  ?15. UTI with <100,000 E Coli. Keflex started. Sens to Keflex finished 7-day treatment but repeat UA showed + nitrite, >50WBC, many bact ?Restart Keflex pending cx ?-Keflex 250 mg BID initiated on 03/16/22, continue treatment, culture still pending ?-4/22 Pt is asymptomatic for urinary symptoms, continue to monitor ?-4/23 C& S resulted back E. Coli, pan sensitive, per Nephrology no treatment indicated. D/c'ed curent order for Keflex. ? ? 16.  Anemia - hx of myelodysplastic d/o sees hematologist Dr Dorthula Perfect at [facility]  ? ?  Latest Ref Rng & Units 03/12/2022  ?  5:13 AM 03/07/2022  ?  9:59 AM 02/27/2022  ?  5:20 AM  ?CBC  ?WBC 4.0 - 10.5 K/uL 7.4   7.8   10.5    ?Hemoglobin 13.0 - 17.0 g/dL 7.8   7.9   8.0    ?Hematocrit 39.0 - 52.0 % 25.3   25.5   24.7    ?Platelets 150 - 400 K/uL 274   235   342    ? ?17.  CKD stage III ? ?-4/23 Pan sens E. Coli, per nephrology no treatment indicated, continue daily BMET until 4/24.  ?-BUN and Crt trending down: BUN 60>>46>38 (4/23) and Crt 2.68>>2.19>2.04 (4/23) ? ? ? ? ?  Latest Ref Rng & Units 03/19/2022  ?  6:17 AM 03/18/2022  ?  6:16 AM 03/17/2022  ?  5:35 AM  ?BMP  ?Glucose 70 - 99 mg/dL 150   127   126    ?BUN 8 - 23 mg/dL 36    38   46    ?Creatinine 0.61 - 1.24 mg/dL 2.10   2.04   2.19    ?Sodium 135 - 145 mmol/L 139   138   134    ?Potassium 3.5 - 5.1 mmol/L 4.9   4.7   5.0    ?Chloride 98 - 111 mmol/L 110   108   106    ?CO2 22 - 32 mmol/L 22   22

## 2022-03-19 NOTE — Progress Notes (Signed)
Patient ID: [name], male   DOB: [birth year], 76 y.o.   MRN: [id number] ? ? ? ?Progress Note from the Palliative Medicine Team at [facility] ? ? ?Patient Name: Vernon [name]        ?Date: 03/19/2022 ?DOB: 1946/10/20  Age: 76 y.o. MRN#: [id number] ?Attending Physician: Charlett Blake, MD ?Primary Care Physician: Baxter Hire, MD ?Admit Date: 02/26/2022 ? ? ?Medical records reviewed  ? ?Per intake H&P --> 76 yo with hx atrial fibrillation on eliquis, T2DM, anemia, GERD, aortic stenosis, MDS and multiple other medical problems presented with weakness and nausea, found to have a cerebellar hemorrhage.  He required intubation for airway protection and underwent emergent craniotomy on 3/18 with hematoma evacuation and drain placement.   ? ?Complicated hospitalization with eventual continued improvement, transition to CIR for ongoing rehabilitation. ? ?Plan for discharge home in the next few days.  Ultimately patient and family's goal is comfort and dignity. ? ? ?This NP visited patient at the bedside as a follow up for palliative medicine needs and emotional support.  Patient's wife at bedside.   ? ?At family request conversation had regarding living will and MOST form. ?Noted healthcare power of attorney and declaration for a natural death in hard chart, copy made for scanning. ? ?Education offered and a MOST form completed.  Copy made for scanning. ? ?Wife verbalizes an understanding of the patient's high risk to decompensate secondary to multiple co-morbidities. ? ?Plan is for outpatient care community-based palliative services at home ? ? ?Education offered to patient and his wife on the importance of continued conversation with family and their  medical providers regarding overall plan of care and treatment options,  ensuring decisions are within the context of the patients values and GOCs. ? ?Questions and concerns addressed    ? ? ?Wadie Lessen NP  ?Palliative Medicine Team Team Phone # [phone] ?Pager  [phone] ?  ?

## 2022-03-19 NOTE — Progress Notes (Addendum)
Inpatient Rehabilitation Discharge Medication Review by a Pharmacist ? ?A complete drug regimen review was completed for this patient to identify any potential clinically significant medication issues. ? ?High Risk Drug Classes Is patient taking? Indication by Medication  ?Antipsychotic Yes Olanzapine: agitation  ?Anticoagulant Yes Heparin SQ: vte ppx   ?Antibiotic Yes Cephalexin: UTI tx  ?Opioid No   ?Antiplatelet No   ?Hypoglycemics/insulin Yes Insulin aspart, glargine: T2DM  ?Vasoactive Medication Yes Carvedilol: hypertension, rate control  ?Chemotherapy No   ?Other Yes Pantoprazole: GERD ?Senokot-S/Miralax: constipation  ? ? ? ?Type of Medication Issue Identified Description of Issue Recommendation(s)  ?Drug Interaction(s) (clinically significant) ?    ?Duplicate Therapy ?    ?Allergy ?    ?No Medication Administration End Date ?    ?Incorrect Dose ?    ?Additional Drug Therapy Needed ?    ?Significant med changes from prior encounter (inform family/care partners about these prior to discharge). Apixaban: discontinued during previous admission ?Simvastatin: discontinued during previous admission See below  ?Other ?  ?   ? ?Clinically significant medication issues were identified that warrant physician communication and completion of prescribed/recommended actions by midnight of the next day:  No ? ?Pharmacist comments:  ?Apixaban: previously prescribed for atrial fibrillation, discontinued by neurology during previous admission due to [place], per neurology notes from 3/29 and 3/30, may consider resuming after 4 weeks (4/25) ?Simvastatin: previously prescribed for hyperlipidemia, stroke ppx, discontinued by neurology due to low LDL (LDL 25) ? ? ?Time spent performing this drug regimen review (minutes):  20 ? ? Thank you for allowing pharmacy to be a part of this patient?s care. ? ?Ardyth Harps, PharmD ?Clinical Pharmacist ? ? ?Welles Walthall Anastasovites BS ?Pharmacy Student ?03/20/2022 8:06 AM ? ?"When you reach the end  of your rope, tie a not in it and hang on" - Franklin D. Roosevelt ? ?

## 2022-03-20 ENCOUNTER — Other Ambulatory Visit: Payer: Self-pay | Admitting: Physical Medicine and Rehabilitation

## 2022-03-20 LAB — GLUCOSE, CAPILLARY: Glucose-Capillary: 140 mg/dL — ABNORMAL HIGH (ref 70–99)

## 2022-03-20 MED ORDER — PANTOPRAZOLE SODIUM 40 MG PO TBEC: 40.0000 mg | DELAYED_RELEASE_TABLET | Freq: Every day | ORAL | 0 refills | Status: DC

## 2022-03-20 MED ORDER — ONDANSETRON HCL 4 MG PO TABS: 4.0000 mg | ORAL_TABLET | Freq: Three times a day (TID) | ORAL | 0 refills | Status: DC | PRN

## 2022-03-20 MED ORDER — INSULIN STARTER KIT- PEN NEEDLES (ENGLISH)
1.0000 | Freq: Once | Status: AC
  Administered 2022-03-20: 1
  Filled 2022-03-20: qty 1

## 2022-03-20 MED ORDER — SENNOSIDES-DOCUSATE SODIUM 8.6-50 MG PO TABS: 1.0000 | ORAL_TABLET | Freq: Two times a day (BID) | ORAL | 0 refills | Status: DC

## 2022-03-20 MED ORDER — POLYETHYLENE GLYCOL 3350 17 G PO PACK: 17.0000 g | PACK | Freq: Every day | ORAL | 0 refills | Status: DC

## 2022-03-20 MED ORDER — INSULIN GLARGINE 100 UNIT/ML SOLOSTAR PEN: 12.0000 [IU] | PEN_INJECTOR | Freq: Every day | SUBCUTANEOUS | 11 refills | Status: DC

## 2022-03-20 MED ORDER — CAREFINE PEN NEEDLES 32G X 5 MM MISC
1.0000 | Freq: Every day | 0 refills | Status: DC
Start: 2022-03-20 — End: 2022-08-27

## 2022-03-20 MED ORDER — PROSOURCE PLUS PO LIQD: 30.0000 mL | Freq: Two times a day (BID) | ORAL | Status: DC

## 2022-03-20 MED ORDER — BLOOD GLUCOSE MONITOR KIT: PACK | 0 refills | Status: DC

## 2022-03-20 NOTE — Progress Notes (Signed)
Educated and demonstration given to wife, son, and patient on insulin needles. Wife showed demonstration to nurse and verbalized understanding.  ? ? ?Pt discharged with belongings and wheelchair. Pt transferred to car via wheelchair with walker without complications.  ?

## 2022-03-20 NOTE — Plan of Care (Signed)
?  Problem: RH Balance ?Goal: LTG Patient will maintain dynamic standing balance (PT) ?Description: LTG:  Patient will maintain dynamic standing balance with assistance during mobility activities (PT) ?Outcome: Progressing ?Flowsheets (Taken 03/19/2022 1640) ?LTG: Pt will maintain dynamic standing balance during mobility activities with:: Minimal Assistance - Patient > 75% ?  ?Problem: RH Balance ?Goal: LTG Patient will maintain dynamic sitting balance (PT) ?Description: LTG:  Patient will maintain dynamic sitting balance with assistance during mobility activities (PT) ?Outcome: Completed/Met ?Flowsheets (Taken 02/28/2022 0548) ?LTG: Pt will maintain dynamic sitting balance during mobility activities with:: Supervision/Verbal cueing ?  ?Problem: Sit to Stand ?Goal: LTG:  Patient will perform sit to stand with assistance level (PT) ?Description: LTG:  Patient will perform sit to stand with assistance level (PT) ?Outcome: Completed/Met ?Flowsheets (Taken 02/28/2022 0548) ?LTG: PT will perform sit to stand in preparation for functional mobility with assistance level: Contact Guard/Touching assist ?  ?Problem: RH Bed Mobility ?Goal: LTG Patient will perform bed mobility with assist (PT) ?Description: LTG: Patient will perform bed mobility with assistance, with/without cues (PT). ?Outcome: Completed/Met ?Flowsheets (Taken 03/19/2022 1641) ?LTG: Pt will perform bed mobility with assistance level of: Supervision/Verbal cueing ?  ?Problem: RH Bed to Chair Transfers ?Goal: LTG Patient will perform bed/chair transfers w/assist (PT) ?Description: LTG: Patient will perform bed to chair transfers with assistance (PT). ?Outcome: Completed/Met ?Flowsheets (Taken 03/19/2022 1643) ?LTG: Pt will perform Bed to Chair Transfers with assistance level: Contact Guard/Touching assist ?  ?Problem: RH Car Transfers ?Goal: LTG Patient will perform car transfers with assist (PT) ?Description: LTG: Patient will perform car transfers with assistance  (PT). ?Outcome: Completed/Met ?Flowsheets (Taken 02/28/2022 0548) ?LTG: Pt will perform car transfers with assist:: Contact Guard/Touching assist ?  ?Problem: RH Ambulation ?Goal: LTG Patient will ambulate in controlled environment (PT) ?Description: LTG: Patient will ambulate in a controlled environment, # of feet with assistance (PT). ?Outcome: Completed/Met ?Flowsheets (Taken 02/28/2022 0548) ?LTG: Pt will ambulate in controlled environ  assist needed:: Minimal Assistance - Patient > 75% ?LTG: Ambulation distance in controlled environment: 126f using LRAD ?Goal: LTG Patient will ambulate in home environment (PT) ?Description: LTG: Patient will ambulate in home environment, # of feet with assistance (PT). ?Outcome: Completed/Met ?Flowsheets (Taken 02/28/2022 0548) ?LTG: Pt will ambulate in home environ  assist needed:: Minimal Assistance - Patient > 75% ?LTG: Ambulation distance in home environment: at least 50 ft using LRAD ?  ?Problem: RH Wheelchair Mobility ?Goal: LTG Patient will propel w/c in controlled environment (PT) ?Description: LTG: Patient will propel wheelchair in controlled environment, # of feet with assist (PT) ?Outcome: Completed/Met ?Flowsheets (Taken 02/28/2022 0548) ?LTG: Pt will propel w/c in controlled environ  assist needed:: Contact Guard/Touching assist ?LTG: Propel w/c distance in controlled environment: 100 ft ?  ?Problem: RH Stairs ?Goal: LTG Patient will ambulate up and down stairs w/assist (PT) ?Description: LTG: Patient will ambulate up and down # of stairs with assistance (PT) ?Outcome: Completed/Met ?Flowsheets ?Taken 03/19/2022 1643 ?LTG: Pt will ambulate up/down stairs assist needed:: Contact Guard/Touching assist ?Taken 02/28/2022 0548 ?LTG: Pt will  ambulate up and down number of stairs: at least 6 steps with HR setup as per home environment ?  ?  ?

## 2022-03-20 NOTE — Progress Notes (Signed)
?                                                       PROGRESS NOTE ? ? ?Subjective/Complaints: ? ?Appreciate nephro consult , some improvement in BMET  ? ?ROS- Denies fever, N/V/D, CP, or SOB. ? ? ?Objective: ?  ?No results found. ?No results for input(s): WBC, HGB, HCT, PLT in the last 72 hours. ? ? ?Recent Labs  ?  03/18/22 ?0616 03/19/22 ?0617  ?NA 138 139  ?K 4.7 4.9  ?CL 108 110  ?CO2 22 22  ?GLUCOSE 127* 150*  ?BUN 38* 36*  ?CREATININE 2.04* 2.10*  ?CALCIUM 9.0 9.0  ? ? ? ? ?Intake/Output Summary (Last 24 hours) at 03/20/2022 0835 ?Last data filed at 03/20/2022 0003 ?Gross per 24 hour  ?Intake 460 ml  ?Output 1000 ml  ?Net -540 ml  ? ?  ? ?  ? ?Physical Exam: ?Vital Signs ?Blood pressure 115/76, pulse 69, temperature 98.1 ?F (36.7 ?C), temperature source Oral, resp. rate 14, height '5\' 9"'$  (1.753 m), weight 83.3 kg, SpO2 96 %. ? ? ?General: Not in acute distress.  ?Heart: Regular rate. + Murmur. ?Lungs: Unlabored breathing, clear to auscultation, no wheezes or rales. ?Abdomen: +BS all 4 quads, soft, non-tender to palpation, non-distended. ?Extremities: No edema, clubbing, or cyanosis. ?Skin: No rash or skin breakdown. ?Neurologic: Awake and alert ?Motor: 5/5 bilateral deltoid, bicep, tricep, grip, hip flexor, knee extensors, ankle and plantar dorsiflexion. ?Cerebellar exam: LUE ataxia improving. ?Musculoskeletal: FROM all extremities.  No joint swelling present. ?Psych: Appropriate mood/affect. ? ?PE: Unchanged from 03/17/22 ? ?Assessment/Plan: ?1. 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 ?Care Tool: ? ?Bathing ? Bathing activity did not occur: Safety/medical concerns ?Body parts bathed by patient: Right arm, Left arm, Chest, Abdomen, Face, Front perineal area, Right upper leg, Left upper leg, Right lower leg, Left lower leg  ? Body parts bathed by helper:  Buttocks ?  ?  ?Bathing assist Assist Level: Minimal Assistance - Patient > 75% ?  ?  ?Upper Body Dressing/Undressing ?Upper body dressing Upper body dressing/undressing activity did not occur (including orthotics): Safety/medical concerns ?What is the patient wearing?: Pull over shirt ?   ?Upper body assist Assist Level: Supervision/Verbal cueing ?   ?Lower Body Dressing/Undressing ?Lower body dressing ? ? ? Lower body dressing activity did not occur: Safety/medical concerns ?What is the patient wearing?: Pants ? ?  ? ?Lower body assist Assist for lower body dressing: Minimal Assistance - Patient > 75% ?   ? ?Toileting ?Toileting    ?Toileting assist Assist for toileting: Minimal Assistance - Patient > 75% ?  ?  ?Transfers ?Chair/bed transfer ? ?Transfers assist ? Chair/bed transfer activity did not occur: Safety/medical concerns ? ?Chair/bed transfer assist level: Minimal Assistance - Patient > 75% ?Chair/bed transfer assistive device: Armrests, Walker ?  ?Locomotion ?Ambulation ? ? ?Ambulation assist ? ? Ambulation activity did not occur: Safety/medical concerns ? ?Assist level: Minimal Assistance - Patient > 75% ?Assistive device: Walker-rolling (weighted RW) ?Max distance: 40f  ? ?Walk 10 feet activity ? ? ?Assist ? Walk 10 feet activity did not occur: Safety/medical concerns ? ?  ?   ? ?  Walk 50 feet activity ? ? ?Assist Walk 50 feet with 2 turns activity did not occur: Safety/medical concerns ? ?  ?   ? ? ?Walk 150 feet activity ? ? ?Assist Walk 150 feet activity did not occur: Safety/medical concerns ? ?  ?  ?  ? ?Walk 10 feet on uneven surface  ?activity ? ? ?Assist Walk 10 feet on uneven surfaces activity did not occur: Safety/medical concerns ? ? ?  ?   ? ?Wheelchair ? ? ? ? ?Assist Is the patient using a wheelchair?: Yes ?Type of Wheelchair: Manual ?Wheelchair activity did not occur: Safety/medical concerns ? ?  ?   ? ? ?Wheelchair 50 feet with 2 turns activity ? ? ? ?Assist ? ?  ?Wheelchair 50 feet with  2 turns activity did not occur: Safety/medical concerns ? ? ?   ? ?Wheelchair 150 feet activity  ? ? ? ?Assist ? Wheelchair 150 feet activity did not occur: Safety/medical concerns ? ? ?   ? ?Blood pressure 115/76, pulse 69, temperature 98.1 ?F (36.7 ?C), temperature source Oral, resp. rate 14, height '5\' 9"'$  (1.753 m), weight 83.3 kg, SpO2 96 %. ? ?Medical Problem List and Plan: ?1. 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 Continue CIR, 4/25 d/c date ?Pt will f/u with PCP Dr Edwina Barth at [facility]  ?F/u neuro  ?2.  Antithrombotics: ?-DVT/anticoagulation:  Pharmaceutical: Heparin initiated 02/12/2022 ?            -antiplatelet therapy: N/A ?3. Pain Management: Tylenol as needed ?4. Decreased arousal as well as agitation improved :  ?Improved d/c amantadine, d/c zyprexa   ? ?5. Neuropsych: This patient is capable of making medical decisions on his own behalf. ?Will ask Neuropsych to help delineate his cognitive issues and capacity ?SLP has noted improvement in cognition as well ? TeleSitter for safety ?4/22 TeleSitter use continued for safety. ?6. Skin/Wound Care: Routine skin checks ?7. Fluids/Electrolytes/Nutrition: Routine in and outs ?8.  Postop large right-sided pleural effusion.  Status postthoracentesis with 1250 cc yield.  Latest chest x-ray unremarkable ?9.  Seizure prophylaxis.  Keppra 500 mg twice daily completed ?10.  Atrial fibrillation.  Eliquis currently on hold.  Neurology considering resuming in approximately 4 weeks.  Cardiac rate controlled ?11.  Diabetes mellitus with hyperglycemia.  Hemoglobin A1c 6.2.  CBG (last 3)  ?Recent Labs  ?  03/19/22 ?1657 03/19/22 ?2057 03/20/22 ?9476  ?GLUCAP 213* 183* 140*  ? ?Semglee to 10U  ? D/C home on this dose  ?12.  Chronic anemia.   ? Hemoglobin 7.9 on 4/12, relatively  stable, labs ordered for Monday ?          -4/22 No new labs pending CBC for 4/24 ? ?13  Hypertension.  Continue Coreg 3.125 mg twice daily.  Monitor with increased mobility ?          ?Vitals:  ? 03/19/22 2029 03/20/22 0436  ?BP: 139/71 115/76  ?Pulse: 77 69  ?Resp: 14 14  ?Temp: 98.4 ?F (36.9 ?C) 98.1 ?F (36.7 ?C)  ?SpO2: 100% 96%  ? Running low with bradycardia , off  coreg x 4d, repeat fluid bolus with .9NS yesterday improved this am  ? ? ?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  improved , intake much better  ?  ?15. UTI with <100,000 E Coli. Keflex started. Sens to Keflex finished 7-day treatment but repeat  UA showed + nitrite, >50WBC, many bact ?Restart Keflex pending cx ?-Keflex 250 mg BID initiated on 03/16/22, continue treatment, culture still pending ?-4/22 Pt is asymptomatic for urinary symptoms, continue to monitor ?-4/23 C& S resulted back E. Coli, pan sensitive, per Nephrology no treatment indicated. D/c'ed curent order for Keflex. ? ? 16.  Anemia - hx of myelodysplastic d/o sees hematologist Dr Dorthula Perfect at [facility]  ? ?  Latest Ref Rng & Units 03/12/2022  ?  5:13 AM 03/07/2022  ?  9:59 AM 02/27/2022  ?  5:20 AM  ?CBC  ?WBC 4.0 - 10.5 K/uL 7.4   7.8   10.5    ?Hemoglobin 13.0 - 17.0 g/dL 7.8   7.9   8.0    ?Hematocrit 39.0 - 52.0 % 25.3   25.5   24.7    ?Platelets 150 - 400 K/uL 274   235   342    ? ?17.  CKD stage III ? ?-4/23 Pan sens E. Coli, per nephrology no treatment indicated, continue daily BMET until 4/24.  ?-BUN and Crt trending down: BUN 60>>46>38 (4/23) and Crt 2.68>>2.19>2.04 (4/23) ? ? ? ? ?  Latest Ref Rng & Units 03/19/2022  ?  6:17 AM 03/18/2022  ?  6:16 AM 03/17/2022  ?  5:35 AM  ?BMP  ?Glucose 70 - 99 mg/dL 150   127   126    ?BUN 8 - 23 mg/dL 36   38   46    ?Creatinine 0.61 - 1.24 mg/dL 2.10   2.04   2.19    ?Sodium 135 - 145 mmol/L 139   138   134    ?Potassium 3.5 - 5.1 mmol/L 4.9   4.7   5.0    ?Chloride 98 - 111 mmol/L 110   108   106    ?CO2 22 - 32 mmol/L '22   22   20    '$ ?Calcium 8.9  - 10.3 mg/dL 9.0   9.0   9.2    ? ? ?LOS: ?22 days ?A FACE TO FACE EVALUATION WAS PERFORMED ? ?Luanna Salk Shanieka Blea ?03/20/2022, 8:35 AM  ? ? ? ?

## 2022-03-22 ENCOUNTER — Telehealth: Payer: Self-pay

## 2022-03-22 NOTE — Telephone Encounter (Signed)
Transitional Care call--who you talked with ? ? ? ?Are you/is patient experiencing any problems since coming home? YES Are there any questions regarding any aspect of care? ?Are there any questions regarding medications administration/dosing? NO Are meds being taken as prescribed? YES Patient should review meds with caller to confirm ?Have there been any falls? NO ?Has Home Health been to the house and/or have they contacted you? NO If not, have you tried to contact them? YES Can we help you contact them? YES ?Are bowels and bladder emptying properly? NO Are there any unexpected incontinence issues? If applicable, is patient following bowel/bladder programs? ?Any fevers, problems with breathing, unexpected pain? NO ?Are there any skin problems or new areas of breakdown? NO ?Has the patient/family member arranged specialty MD follow up (ie cardiology/neurology/renal/surgical/etc)? YES Can we help arrange? NO ?Does the patient need any other services or support that we can help arrange? YES ?Are caregivers following through as expected in assisting the patient? YES ?Has the patient quit smoking, drinking alcohol, or using drugs as recommended? YES ? ?Appointment time, arrive time and who it is with here ?[place]  04/03/22 10:20 am arrival 10 am with Dr. Curlene Dolphin ?

## 2022-03-26 ENCOUNTER — Encounter: Payer: Self-pay | Admitting: Internal Medicine

## 2022-03-27 ENCOUNTER — Telehealth: Payer: Self-pay | Admitting: *Deleted

## 2022-03-27 ENCOUNTER — Telehealth: Payer: Self-pay | Admitting: Nurse Practitioner

## 2022-03-27 NOTE — Telephone Encounter (Signed)
Wife called stating that patient has been discharged from rehab and that the patient needs an appointment ASAP his last hgb Friday was 7.8. Please contact [name] as she is coordinating his appointments for him. [phone].  She is fine with him being scheduled with APP if Dr B is not available ?

## 2022-03-27 NOTE — Telephone Encounter (Signed)
Dr. B, when do you recommend patient be seen in clinic? ?

## 2022-03-27 NOTE — Telephone Encounter (Signed)
Spoke with patient's wife [name], regarding the Palliative referral and all questions were answered and she wanted to discuss this with the patient first to make sure that he is in agreement with beginning services.  Wife took my name and contact number and will call me back to let me know what he has decided to do. ?

## 2022-03-28 ENCOUNTER — Encounter: Payer: Self-pay | Admitting: Internal Medicine

## 2022-03-28 NOTE — Telephone Encounter (Signed)
Please schedule patient for lab/NP/poss Retacrit next week and inform pt/family of appt details.  Thanks ?

## 2022-04-03 ENCOUNTER — Encounter: Payer: Self-pay | Admitting: Physical Medicine & Rehabilitation

## 2022-04-03 ENCOUNTER — Encounter: Payer: Medicare Other | Attending: Physical Medicine & Rehabilitation | Admitting: Physical Medicine & Rehabilitation

## 2022-04-03 VITALS — BP 116/71 | HR 80 | Ht 69.0 in | Wt 191.0 lb

## 2022-04-03 DIAGNOSIS — R1310 Dysphagia, unspecified: Secondary | ICD-10-CM | POA: Diagnosis not present

## 2022-04-03 DIAGNOSIS — I10 Essential (primary) hypertension: Secondary | ICD-10-CM | POA: Insufficient documentation

## 2022-04-03 DIAGNOSIS — R1013 Epigastric pain: Secondary | ICD-10-CM | POA: Diagnosis not present

## 2022-04-03 DIAGNOSIS — I614 Nontraumatic intracerebral hemorrhage in cerebellum: Secondary | ICD-10-CM | POA: Diagnosis present

## 2022-04-03 NOTE — Progress Notes (Signed)
? ?Subjective:  ? ? Patient ID: Vernon [name], male    DOB: [birth year], 76 y.o.   MRN: [id number] ? ?HPI ?Hospital HPI ?"Vernon [name] is a 76 y.o. right-handed male with history of chronic anemia diabetes mellitus hyperlipidemia hypertension atrial fibrillation maintained on Eliquis.  Per chart review lives with spouse.  Independent prior to admission and retired.  Presented 02/10/2022 with left-sided weakness and incoordination as well as nausea vomiting.  CT of the head showed a 2.2 cm intraparenchymal hemorrhage in the left middle cerebellar peduncle and probable small volume adjacent extra-axial extension.  Surrounding edema with mild effacement of the fourth ventricle.  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  Mild atherosclerosis without greater than 50% stenosis.  Eliquis was reversed.  Patient underwent suboccipital craniectomy evacuation of cerebellar hemorrhage.  Placement of right parieto-occipital ventriculostomy 02/11/2022 per Dr. Ellene Route.  Maintained on Keppra postoperatively for seizure prophylaxis.  Follow-up CT/MRI no hydrocephalus.  EVD was pulled 3/22.  Patient did require short-term intubation through 02/14/2022.  Postoperative course findings of left side pleural effusion moderate to large right side pleural effusion underwent thoracentesis with 1250 cc yield. " ? ?"[name] was admitted to rehab 02/26/2022 for inpatient therapies to consist of PT, ST and OT at least three hours five days a week. Past admission physiatrist, therapy team and rehab RN have worked together to provide customized collaborative inpatient rehab.  Pertaining to patient's intraparenchymal hemorrhage status post suboccipital craniectomy evacuation of cerebellar hemorrhage placement of parieto-occipital ventriculostomy 02/11/2022.  Follow-up with a distance by neurosurgery.  He was cleared to begin subcutaneous heparin for DVT prophylaxis 02/12/2022.  Mood stabilization with  amantadine as well as Zyprexa.  Seizure prophylaxis completing course of Keppra.  Atrial fibrillation Eliquis on hold due to intraparenchymal hemorrhage and cardiac rate controlled and he was later cleared to resume Eliquis prior to discharge.  Blood sugars monitored hemoglobin A1c 6.2 insulin therapy as directed.  Patient had been on Glucophage prior to admission held due to renal insufficiency and follow-up with PCP.  Blood pressure remained soft and monitored.  He was completing a course of Keflex for UTI.  AKI on CKD stage III with renal service follow-up and latest creatinine 2.04.  Noted dysphagia n.p.o. nasogastric tube feeds and discussion held for possible need for PEG tube of which wife adamantly refused and follow-up swallow study diet advanced to a mechanical soft thin liquid.  Palliative care did continue to follow establishing goals of care." ? ? ? ?Mr Vernon [name] is a 76 year old male with DM, HLD, Afib who is here for f/u after recent hospitalization and inpatient rehabilitation.  Today patient and his partner report he is doing very well since discharge.  His strength has improved since discharge. He has home OT and PT. His partner reports he has mild increased difficulty with congitive tasks such as following scheduled appointments. SLP services are not able to be provided by current home health agency.  He is working on ambulation with therapy but is still very unsteady due to coordination deficits. He is working on ambulation with RW.  He is using protonix but denies history of GERD and denies current heartburn.  He does not have any refills of this medication.  No Uti symptoms at this time. He is following with his PCP who adjusted his insulin. He denies pain.   ? ? ?Medications at discharge. ?1.  Tylenol as needed ?2.  Semglee 10 units daily ?3.  Protonix 40 mg daily ?4.  MiraLAX daily hold for loose stools ?5.  Senokot S1 tablet twice daily ?6.Eliquis '5mg'$  BID ? ? ?Pain Inventory ?Average Pain  0 ?Pain Right Now 0 ?My pain is  no pain ? ?LOCATION OF PAIN  left hand trembles ? ?BOWEL ?Number of stools per week: 9 ?Oral laxative use Yes  ?Type of laxative Miralax Senakot ? ? ?BLADDER ?Normal ? ? ? ?Mobility ?use a walker ?how many minutes can you walk? 2 minutes ?ability to climb steps?  yes ?do you drive?  no ?use a wheelchair ?transfers alone ?Do you have any goals in this area?  yes ? ?Function ?retired ?I need assistance with the following:  dressing, meal prep, and household duties ?Do you have any goals in this area?  yes ? ?Neuro/Psych ?tremor ?trouble walking ?confusion ? ?Prior Studies ?Any changes since last visit?  yes ? ?Physicians involved in your care ?Any changes since last visit?  yes ? ? ?Family History  ?Problem Relation Age of Onset  ? Cancer Maternal Uncle   ? ?Social History  ? ?Socioeconomic History  ? Marital status: Married  ?  Spouse name: Not on file  ? Number of children: Not on file  ? Years of education: Not on file  ? Highest education level: Not on file  ?Occupational History  ? Not on file  ?Tobacco Use  ? Smoking status: Former  ?  Packs/day: 0.50  ?  Years: 8.00  ?  Pack years: 4.00  ?  Types: Cigarettes  ?  Quit date: 07/21/1978  ?  Years since quitting: 43.7  ? Smokeless tobacco: Never  ?Vaping Use  ? Vaping Use: Never used  ?Substance and Sexual Activity  ? Alcohol use: Yes  ?  Alcohol/week: 0.0 - 1.0 standard drinks  ?  Comment: rare beer  ? Drug use: Never  ? Sexual activity: Not on file  ?Other Topics Concern  ? Not on file  ?Social History Narrative  ? Lives in [place]; with wife; quit smoking in early 50s; ocassional/ rare [may be 1 a month beer]. 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   ? ?Social Determinants of Health  ? ?Financial Resource Strain: Not on file  ?Food Insecurity: Not on file  ?Transportation Needs: Not on file  ?Physical Activity: Not on file  ?Stress: Not on file  ?Social Connections: Not on file  ? ?Past Surgical History:  ?Procedure  Laterality Date  ? CARPAL TUNNEL RELEASE  2012  ? CRANIOTOMY N/A 02/10/2022  ? Procedure: SUBOCCIPITAL CRANIECTOMY FOR EVACUATION OF CEREBELLAR HEMATOMA;  Surgeon: Kristeen Miss, MD;  Location: [facility];  Service: Neurosurgery;  Laterality: N/A;  ? JOINT REPLACEMENT Right 2010  ? SHOULDER ARTHROSCOPY WITH ROTATOR CUFF REPAIR AND OPEN BICEPS TENODESIS Right 11/09/2019  ? Procedure: RIGHT SHOULDER ARTHROSCOPY WITH SUBSCAPULARIS REPAIR, SUBACROMIAL DECOMPRESSION,MINI OPEN ROTATOR CUFF REPAIR;  Surgeon: Leim Fabry, MD;  Location: [facility];  Service: Orthopedics;  Laterality: Right;  ? ?Past Medical History:  ?Diagnosis Date  ? Anemia   ? Aortic stenosis   ? Arthritis   ? Complication of anesthesia   ? hard time getting bp up after knee replacement  ? Diabetes mellitus without complication ([place])   ? GERD (gastroesophageal reflux disease)   ? occ tums prn  ? History of hiatal hernia   ? Hypertension   ? 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[place])   ? ?BP 116/71   Pulse 80   Ht '5\' 9"'$  (1.753 m)  Wt 191 lb (86.6 kg)   SpO2 95%   BMI 28.21 kg/m?  ? ?Opioid Risk Score:   ?Fall Risk Score:  `1 ? ?Depression screen PHQ 2/9 ? ? ?  04/03/2022  ? 10:27 AM 07/22/2019  ?  9:11 AM  ?Depression screen PHQ 2/9  ?Decreased Interest 0 0  ?Down, Depressed, Hopeless 0 0  ?PHQ - 2 Score 0 0  ?Altered sleeping 0   ?Tired, decreased energy 1   ?Change in appetite 0   ?Feeling bad or failure about yourself  0   ?Trouble concentrating 3   ?Moving slowly or fidgety/restless 1   ?Suicidal thoughts 0   ?PHQ-9 Score 5   ?  ?Review of Systems  ?Musculoskeletal:  Positive for gait problem.  ?Neurological:  Positive for tremors.  ?Hematological:  Bruises/bleeds easily.  ?Psychiatric/Behavioral:  Positive for confusion.   ?All other systems reviewed and are negative. ? ?   ?Objective:  ? Physical Exam ?Today's Vitals  ? 04/03/22 1022  ?BP: 116/71  ?Pulse: 80  ?SpO2: 95%  ?Weight: 86.6 kg  ?Height: '5\' 9"'$  (1.753 m)  ? ?Body mass index is 28.21 kg/m?. ? ?Gen: no  distress, normal appearing ?HEENT: oral mucosa pink and moist, NCAT ?Cardio: Reg rate ?Chest: normal effort, normal rate of breathing ?Abd: soft, non-distended ?Ext: no edema ?Psych: pleasant, normal affect ?Skin: intact

## 2022-04-04 ENCOUNTER — Inpatient Hospital Stay: Payer: Medicare Other | Attending: Internal Medicine

## 2022-04-04 ENCOUNTER — Inpatient Hospital Stay: Payer: Medicare Other

## 2022-04-04 ENCOUNTER — Inpatient Hospital Stay (HOSPITAL_BASED_OUTPATIENT_CLINIC_OR_DEPARTMENT_OTHER): Payer: Medicare Other | Admitting: Nurse Practitioner

## 2022-04-04 ENCOUNTER — Encounter: Payer: Self-pay | Admitting: Physical Medicine & Rehabilitation

## 2022-04-04 VITALS — BP 127/71 | HR 75 | Resp 18 | Wt 193.0 lb

## 2022-04-04 DIAGNOSIS — D46Z Other myelodysplastic syndromes: Secondary | ICD-10-CM

## 2022-04-04 DIAGNOSIS — D539 Nutritional anemia, unspecified: Secondary | ICD-10-CM

## 2022-04-04 DIAGNOSIS — L738 Other specified follicular disorders: Secondary | ICD-10-CM | POA: Insufficient documentation

## 2022-04-04 DIAGNOSIS — D461 Refractory anemia with ring sideroblasts: Secondary | ICD-10-CM | POA: Diagnosis present

## 2022-04-04 DIAGNOSIS — Z7729 Contact with and (suspected ) exposure to other hazardous substances: Secondary | ICD-10-CM | POA: Insufficient documentation

## 2022-04-04 DIAGNOSIS — M810 Age-related osteoporosis without current pathological fracture: Secondary | ICD-10-CM | POA: Insufficient documentation

## 2022-04-04 DIAGNOSIS — Z659 Problem related to unspecified psychosocial circumstances: Secondary | ICD-10-CM | POA: Insufficient documentation

## 2022-04-04 DIAGNOSIS — M1990 Unspecified osteoarthritis, unspecified site: Secondary | ICD-10-CM | POA: Insufficient documentation

## 2022-04-04 DIAGNOSIS — M75100 Unspecified rotator cuff tear or rupture of unspecified shoulder, not specified as traumatic: Secondary | ICD-10-CM | POA: Insufficient documentation

## 2022-04-04 DIAGNOSIS — M25569 Pain in unspecified knee: Secondary | ICD-10-CM | POA: Insufficient documentation

## 2022-04-04 DIAGNOSIS — Z7901 Long term (current) use of anticoagulants: Secondary | ICD-10-CM | POA: Insufficient documentation

## 2022-04-04 DIAGNOSIS — M109 Gout, unspecified: Secondary | ICD-10-CM | POA: Insufficient documentation

## 2022-04-04 DIAGNOSIS — D469 Myelodysplastic syndrome, unspecified: Secondary | ICD-10-CM

## 2022-04-04 DIAGNOSIS — Z0289 Encounter for other administrative examinations: Secondary | ICD-10-CM | POA: Insufficient documentation

## 2022-04-04 LAB — CBC WITH DIFFERENTIAL/PLATELET
Abs Immature Granulocytes: 0.05 10*3/uL (ref 0.00–0.07)
Basophils Absolute: 0 10*3/uL (ref 0.0–0.1)
Basophils Relative: 1 %
Eosinophils Absolute: 0.1 10*3/uL (ref 0.0–0.5)
Eosinophils Relative: 1 %
HCT: 26.1 % — ABNORMAL LOW (ref 39.0–52.0)
Hemoglobin: 8.4 g/dL — ABNORMAL LOW (ref 13.0–17.0)
Immature Granulocytes: 1 %
Lymphocytes Relative: 18 %
Lymphs Abs: 1.1 10*3/uL (ref 0.7–4.0)
MCH: 29.4 pg (ref 26.0–34.0)
MCHC: 32.2 g/dL (ref 30.0–36.0)
MCV: 91.3 fL (ref 80.0–100.0)
Monocytes Absolute: 0.6 10*3/uL (ref 0.1–1.0)
Monocytes Relative: 10 %
Neutro Abs: 4.3 10*3/uL (ref 1.7–7.7)
Neutrophils Relative %: 69 %
Platelets: 313 10*3/uL (ref 150–400)
RBC: 2.86 MIL/uL — ABNORMAL LOW (ref 4.22–5.81)
RDW: 27.8 % — ABNORMAL HIGH (ref 11.5–15.5)
WBC: 6.2 10*3/uL (ref 4.0–10.5)
nRBC: 0.6 % — ABNORMAL HIGH (ref 0.0–0.2)

## 2022-04-04 LAB — BASIC METABOLIC PANEL
Anion gap: 8 (ref 5–15)
BUN: 65 mg/dL — ABNORMAL HIGH (ref 8–23)
CO2: 18 mmol/L — ABNORMAL LOW (ref 22–32)
Calcium: 8.9 mg/dL (ref 8.9–10.3)
Chloride: 108 mmol/L (ref 98–111)
Creatinine, Ser: 1.47 mg/dL — ABNORMAL HIGH (ref 0.61–1.24)
GFR, Estimated: 49 mL/min — ABNORMAL LOW (ref >60)
Glucose, Bld: 174 mg/dL — ABNORMAL HIGH (ref 70–99)
Potassium: 4.4 mmol/L (ref 3.5–5.1)
Sodium: 134 mmol/L — ABNORMAL LOW (ref 135–145)

## 2022-04-04 LAB — FERRITIN: Ferritin: 884 ng/mL — ABNORMAL HIGH (ref 24–336)

## 2022-04-04 LAB — VITAMIN B12: Vitamin B-12: 1098 pg/mL — ABNORMAL HIGH (ref 180–914)

## 2022-04-04 LAB — IRON AND TIBC
Iron: 115 ug/dL (ref 45–182)
Saturation Ratios: 41 % — ABNORMAL HIGH (ref 17.9–39.5)
TIBC: 279 ug/dL (ref 250–450)
UIBC: 164 ug/dL

## 2022-04-04 MED ORDER — EPOETIN ALFA-EPBX 20000 UNIT/ML IJ SOLN
INTRAMUSCULAR | Status: AC
  Filled 2022-04-04: qty 1

## 2022-04-04 MED ORDER — EPOETIN ALFA-EPBX 20000 UNIT/ML IJ SOLN
20000.0000 [IU] | Freq: Once | INTRAMUSCULAR | Status: AC
  Administered 2022-04-04: 20000 [IU] via SUBCUTANEOUS

## 2022-04-04 NOTE — Progress Notes (Unsigned)
[place] ?CONSULT NOTE ? ?Patient Care Team: ?Baxter Hire, MD as PCP - General (Internal Medicine) ?Cammie Sickle, MD as Consulting Physician (Hematology and Oncology) ? ?CHIEF COMPLAINTS/PURPOSE OF CONSULTATION: MDS ? ?Oncology History Overview Note  ?# EGD-none; colonoscopy-never [[facility] x2- NEG];FEB 2021- US- abdomen-no liver disease/ Mild splenic enlargement [460cc; kidney cysts]; P21 folic acid/LDH haptoglobin normal. ? 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 ?# April 1st 2021- RETACRIT ?  ? ?# CKD- stage III [GFR 58]/ # A.fib on eliquis. ? ? ?# NGS/MOLECULAR TESTS:[facility] ON hem- May 2021- Cux-1; DNMT3A; SF3B** ? ?# PALLIATIVE CARE EVALUATION: NA ? ? ?DIAGNOSIS: MDS low risk ? ? GOALS: Control ? ?CURRENT/MOST RECENT THERAPY : Retacrit ? ?  ?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[place])  ?02/18/2020 Initial Diagnosis  ? MDS (myelodysplastic syndrome), low grade ([place]) ?  ? ? ? ?HISTORY OF PRESENTING ILLNESS: Ambulating independently.  Alone. ? ?Vernon [name] 76 y.o.  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 ? ?Denies any worsening fatigue.  Denies any nausea vomiting abdominal pain.  No weight loss.  No worsening shortness of breath or cough. ? ?Review of Systems  ?Constitutional:  Positive for malaise/fatigue. Negative for chills, diaphoresis, fever and weight loss.  ?HENT:  Negative for nosebleeds and sore throat.   ?Eyes:  Negative for double vision.  ?Respiratory:  Negative for cough, hemoptysis, sputum production, shortness of breath and wheezing.   ?Cardiovascular:  Negative for chest pain, palpitations, orthopnea and leg swelling.  ?Gastrointestinal:  Negative for abdominal pain, blood in stool,  constipation, diarrhea, heartburn, melena, nausea and vomiting.  ?Genitourinary:  Negative for dysuria, frequency and urgency.  ?Musculoskeletal:  Negative for back pain and joint pain.  ?Skin: Negative.  Negative for itching and rash.  ?Neurological:  Negative for dizziness, tingling, focal weakness, weakness and headaches.  ?Endo/Heme/Allergies:  Does not bruise/bleed easily.  ?Psychiatric/Behavioral:  Negative for depression. The patient is not nervous/anxious and does not have insomnia.   ? ?MEDICAL HISTORY:  ?Past Medical History:  ?Diagnosis Date  ?? Anemia   ?? Aortic stenosis   ?? Arthritis   ?? Complication of anesthesia   ? hard time getting bp up after knee replacement  ?? Diabetes mellitus without complication ([place])   ?? GERD (gastroesophageal reflux disease)   ? occ tums prn  ?? History of hiatal hernia   ?? Hypertension   ?? 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[place])   ? ? ?SURGICAL HISTORY: ?Past Surgical History:  ?Procedure Laterality Date  ?? CARPAL TUNNEL RELEASE  2012  ?? CRANIOTOMY N/A 02/10/2022  ? Procedure: SUBOCCIPITAL CRANIECTOMY FOR EVACUATION OF CEREBELLAR HEMATOMA;  Surgeon: Kristeen Miss, MD;  Location: [facility];  Service: Neurosurgery;  Laterality: N/A;  ?? JOINT REPLACEMENT Right 2010  ?? SHOULDER ARTHROSCOPY WITH ROTATOR CUFF REPAIR AND OPEN BICEPS TENODESIS Right 11/09/2019  ? Procedure: RIGHT SHOULDER ARTHROSCOPY WITH SUBSCAPULARIS REPAIR, SUBACROMIAL DECOMPRESSION,MINI OPEN ROTATOR CUFF REPAIR;  Surgeon: Leim Fabry, MD;  Location: [facility];  Service: Orthopedics;  Laterality: Right;  ? ? ?SOCIAL HISTORY: ?Social History  ? ?Socioeconomic History  ?? Marital status: Married  ?  Spouse name: Not on file  ?? Number of children: Not on file  ?? Years of education: Not on file  ?? Highest education level: Not on file  ?Occupational History  ??  Not on file  ?Tobacco Use  ?? Smoking status: Former  ?  Packs/day: 0.50  ?  Years: 8.00  ?  Pack years: 4.00  ?  Types: Cigarettes  ?  Quit date:  07/21/1978  ?  Years since quitting: 43.7  ?? Smokeless tobacco: Never  ?Vaping Use  ?? Vaping Use: Never used  ?Substance and Sexual Activity  ?? Alcohol use: Yes  ?  Alcohol/week: 0.0 - 1.0 standard drinks  ?  Comment: rare beer  ?? Drug use: Never  ?? Sexual activity: Not on file  ?Other Topics Concern  ?? Not on file  ?Social History Narrative  ? Lives in [place]; with wife; quit smoking in early 83s; ocassional/ rare [may be 1 a month beer]. 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   ? ?Social Determinants of Health  ? ?Financial Resource Strain: Not on file  ?Food Insecurity: Not on file  ?Transportation Needs: Not on file  ?Physical Activity: Not on file  ?Stress: Not on file  ?Social Connections: Not on file  ?Intimate Partner Violence: Not on file  ? ? ?FAMILY HISTORY: ?Family History  ?Problem Relation Age of Onset  ?? Cancer Maternal Uncle   ? ? ?ALLERGIES:  has No Known Allergies. ? ?MEDICATIONS:  ?Current Outpatient Medications  ?Medication Sig Dispense Refill  ?? ACCU-CHEK GUIDE test strip USE 1 STRIP 3 TIMES A DAY AS INSTRUCTED    ?? apixaban (ELIQUIS) 5 MG TABS tablet Take 5 mg by mouth 2 (two) times daily.     ?? blood glucose meter kit and supplies KIT Dispense based on patient and insurance preference. Use up to four times daily as directed. 1 each 0  ?? insulin glargine (LANTUS) 100 UNIT/ML Solostar Pen Inject 12 Units into the skin daily. 15 mL 11  ?? Insulin Pen Needle (CAREFINE PEN NEEDLES) 32G X 5 MM MISC 1 application. by Does not apply route daily after breakfast. 30 each 0  ?? ondansetron (ZOFRAN) 4 MG tablet Take 4 mg by mouth every 8 (eight) hours as needed.    ?? pantoprazole (PROTONIX) 40 MG tablet Take 1 tablet (40 mg total) by mouth daily. 30 tablet 0  ?? polyethylene glycol (MIRALAX / GLYCOLAX) 17 g packet Take 17 g by mouth daily. 30 each 0  ?? senna-docusate (SENOKOT-S) 8.6-50 MG tablet Take 1 tablet by mouth 2 (two) times daily. 60 tablet 0  ?? acetaminophen (TYLENOL) 650 MG  CR tablet Take 650 mg by mouth every 8 (eight) hours as needed for pain. (Patient not taking: Reported on 04/03/2022)    ? ?No current facility-administered medications for this visit.  ? ? ? ? ?PHYSICAL EXAMINATION: ? ? ?There were no vitals filed for this visit. ? ?Filed Weights  ? 04/04/22 1320  ?Weight: 193 lb (87.5 kg)  ? ? ?Physical Exam ?HENT:  ?   Head: Normocephalic and atraumatic.  ?   Mouth/Throat:  ?   Pharynx: No oropharyngeal exudate.  ?Eyes:  ?   Pupils: Pupils are equal, round, and reactive to light.  ?Cardiovascular:  ?   Rate and Rhythm: Normal rate and regular rhythm.  ?Pulmonary:  ?   Effort: Pulmonary effort is normal. No respiratory distress.  ?   Breath sounds: Normal breath sounds. No wheezing.  ?Abdominal:  ?   General: Bowel sounds are normal. There is no distension.  ?   Palpations: Abdomen is soft. There is no mass.  ?   Tenderness: There is no abdominal tenderness. There  is no guarding or rebound.  ?Musculoskeletal:     ?   General: No tenderness. Normal range of motion.  ?   Cervical back: Normal range of motion and neck supple.  ?Skin: ?   General: Skin is warm.  ?Neurological:  ?   Mental Status: He is alert and oriented to person, place, and time.  ?Psychiatric:     ?   Mood and Affect: Affect normal.  ? ?LABORATORY DATA:  ?I have reviewed the data as listed ?Lab Results  ?Component Value Date  ? WBC 7.4 03/12/2022  ? HGB 7.8 (L) 03/12/2022  ? HCT 25.3 (L) 03/12/2022  ? MCV 92.7 03/12/2022  ? PLT 274 03/12/2022  ? ?Recent Labs  ?  02/20/22 ?6314 02/21/22 ?0117 02/22/22 ?9702 02/27/22 ?6378 03/12/22 ?5885 03/17/22 ?0277 03/18/22 ?4128 03/19/22 ?0617  ?NA 146* 142   < > 139   < > 134* 138 139  ?K 4.9 5.2*   < > 4.9   < > 5.0 4.7 4.9  ?CL 113* 112*   < > 106   < > 106 108 110  ?CO2 25 25   < > 27   < > 20* 22 22  ?GLUCOSE 144* 121*   < > 175*   < > 126* 127* 150*  ?BUN 35* 35*   < > 41*   < > 46* 38* 36*  ?CREATININE 1.36* 1.35*   < > 1.69*   < > 2.19* 2.04* 2.10*  ?CALCIUM 9.5 9.4   < >  9.0   < > 9.2 9.0 9.0  ?GFRNONAA 54* 55*   < > 42*   < > 31* 33* 32*  ?PROT 6.5 6.7  --  6.6  --   --   --   --   ?ALBUMIN 3.3* 3.4*  --  3.2*  --   --   --   --   ?AST 45* 43*  --  22  --   --   --   --   ?ALT 31

## 2022-04-04 NOTE — Progress Notes (Unsigned)
Patient reports nausea in the mornings ?

## 2022-04-11 ENCOUNTER — Other Ambulatory Visit: Payer: Self-pay | Admitting: Physical Medicine and Rehabilitation

## 2022-04-11 ENCOUNTER — Telehealth: Payer: Self-pay | Admitting: Nurse Practitioner

## 2022-04-11 NOTE — Telephone Encounter (Signed)
Spoke with patient regarding Palliative referral/services and he has declined Palliative services at this time, he wants to focus on the home health rehab for now.  Told patient I would cancel the referral and notify PCP and he was in agreement with this.   ?

## 2022-04-17 ENCOUNTER — Other Ambulatory Visit: Payer: Self-pay

## 2022-04-17 DIAGNOSIS — D469 Myelodysplastic syndrome, unspecified: Secondary | ICD-10-CM

## 2022-04-18 ENCOUNTER — Inpatient Hospital Stay: Payer: Medicare Other

## 2022-04-18 VITALS — BP 129/75 | HR 60

## 2022-04-18 DIAGNOSIS — D469 Myelodysplastic syndrome, unspecified: Secondary | ICD-10-CM

## 2022-04-18 DIAGNOSIS — D461 Refractory anemia with ring sideroblasts: Secondary | ICD-10-CM | POA: Diagnosis not present

## 2022-04-18 LAB — HEMOGLOBIN AND HEMATOCRIT, BLOOD
HCT: 25 % — ABNORMAL LOW (ref 39.0–52.0)
Hemoglobin: 8 g/dL — ABNORMAL LOW (ref 13.0–17.0)

## 2022-04-18 MED ORDER — EPOETIN ALFA-EPBX 20000 UNIT/ML IJ SOLN
20000.0000 [IU] | Freq: Once | INTRAMUSCULAR | Status: AC
  Administered 2022-04-18: 20000 [IU] via SUBCUTANEOUS
  Filled 2022-04-18: qty 1

## 2022-04-30 NOTE — Therapy (Signed)
OUTPATIENT PHYSICAL THERAPY NEURO EVALUATION   Patient Name: Vernon [name] MRN: [id number] DOB:16-Jun-1946, 76 y.o., male Today's Date: 05/01/2022   PCP: Baxter Hire, MD  REFERRING PROVIDER: Baxter Hire, MD    PT End of Session - 05/01/22 1131     Visit Number 1    Number of Visits 16    Date for PT Re-Evaluation 07/03/22    Authorization Type [employer] Medicare    Progress Note Due on Visit 10    PT Start Time 1145    PT Stop Time 1230    PT Time Calculation (min) 45 min    Equipment Utilized During Treatment Gait belt    Activity Tolerance Patient tolerated treatment well;Patient limited by fatigue    Behavior During Therapy WFL for tasks assessed/performed             Past Medical History:  Diagnosis Date   Anemia    Aortic stenosis    Arthritis    Complication of anesthesia    hard time getting bp up after knee replacement   Diabetes mellitus without complication (H[facility])    GERD (gastroesophageal reflux disease)    occ tums prn   History of hiatal hernia    Hypertension    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[place])    Past Surgical History:  Procedure Laterality Date   CARPAL TUNNEL RELEASE  2012   CRANIOTOMY N/A 02/10/2022   Procedure: SUBOCCIPITAL CRANIECTOMY FOR EVACUATION OF CEREBELLAR HEMATOMA;  Surgeon: Kristeen Miss, MD;  Location: [facility];  Service: Neurosurgery;  Laterality: N/A;   JOINT REPLACEMENT Right 2010   SHOULDER ARTHROSCOPY WITH ROTATOR CUFF REPAIR AND OPEN BICEPS TENODESIS Right 11/09/2019   Procedure: RIGHT SHOULDER ARTHROSCOPY WITH SUBSCAPULARIS REPAIR, SUBACROMIAL DECOMPRESSION,MINI OPEN ROTATOR CUFF REPAIR;  Surgeon: Leim Fabry, MD;  Location: [facility];  Service: Orthopedics;  Laterality: Right;   Patient Active Problem List   Diagnosis Date Noted   Long term current use of anticoagulant 04/04/2022   Rotator cuff syndrome 04/04/2022   Exposure to potentially hazardous substance 04/04/2022   Gout 04/04/2022   Osteoarthritis 04/04/2022    Osteoporosis 04/04/2022   Other specified diseases of hair and hair follicles 90/24/0973   Pain in joint, lower leg 04/04/2022   Problem related to unspecified psychosocial circumstances 04/04/2022   General medical examination for administrative purposes 04/04/2022   Stage 3b chronic kidney disease ([place])    Dysphagia, post-stroke    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   Cough 53/29/9242   Acute metabolic encephalopathy 68/34/1962   Obstructive hydrocephalus ([place]) 02/19/2022   Dyslipidemia 02/19/2022   Dysphagia 02/19/2022   AKI (acute kidney injury) ([place])    Hyperkalemia    Anemia    Pleural effusion on right    Respiratory failure requiring intubation (HCC)    Cerebral edema (HCC)    Chronic atrial fibrillation ([place])    Controlled type 2 diabetes mellitus with hyperglycemia, without long-term current use of insulin (HCC)    ICH (intracerebral hemorrhage) ([place]) 02/10/2022   Intracranial hemorrhage (HCC)    Anticoagulated    Moderate tricuspid regurgitation 07/26/2021   Moderate aortic valve stenosis 03/08/2020   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[place]) 02/18/2020   Macrocytic anemia 01/13/2020   Spleen enlargement 01/13/2020   Bilateral carotid artery stenosis 07/14/2018   Atrial fibrillation, chronic ([place]) 07/14/2018   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 Notes his LUE and LLE are most affected at present.  Underwent acute hospitalization, and [facility]. Recently D/C from home health on 04/30/22                                                                                                                                                                                              Pt accompanied by: significant other  PERTINENT HISTORY: MDS, A-fib, DM, R TKR  PAIN:  Are you having pain? No  PRECAUTIONS: Fall  WEIGHT BEARING RESTRICTIONS No  FALLS: Has patient fallen in last 6 months? Yes. Number of falls 1  LIVING ENVIRONMENT: Lives with: lives with their family and lives with their spouse Lives in: House/apartment, ground-floor set-up Stairs: Yes: External: 5 steps; on left going up Has following equipment at home: Environmental consultant - 2 wheeled, Environmental consultant - 4 wheeled, and Wheelchair (manual)  PLOF: Independent  PATIENT GOALS walk independently  OBJECTIVE:   DIAGNOSTIC FINDINGS: see MAR  COGNITION: Overall cognitive status: Within functional limits for tasks assessed   SENSATION: WFL  COORDINATION: Bradykinesia, difficulty wit rapid alternating movements   MUSCLE TONE: WFL    POSTURE: No Significant postural limitations  LOWER EXTREMITY ROM:     WFL  (Blank rows = not tested)  LOWER EXTREMITY MMT:    MMT Right Eval Left Eval  Hip flexion 5 4  Hip extension 5 3+  Hip abduction 5 3-  Hip adduction    Hip internal rotation    Hip external rotation    Knee flexion 5 4  Knee extension 5 4  Ankle dorsiflexion 5 4+  Ankle plantarflexion    Ankle inversion    Ankle eversion    (Blank rows = not tested)  BED MOBILITY:  Independent  TRANSFERS: Assistive device utilized: Environmental consultant - 2 wheeled  Sit to stand: Modified independence Stand to sit: Modified independence Chair to chair: SBA and CGA Floor:  DNT  RAMP:  Not available  CURB:  Level of Assistance: SBA and CGA Assistive device utilized: Environmental consultant - 2 wheeled Curb Comments: undershoots with  LLE  STAIRS:  Level of Assistance: SBA and CGA  Stair Negotiation Technique: Alternating Pattern  with Bilateral Rails  Number of Stairs: 6   Height of Stairs: 4-6"  Comments: dysmetria w/ LLE  GAIT: Gait pattern: circumduction- Left and ataxic Distance walked: 100 Assistive device utilized: Walker - 2 wheeled Level of assistance: SBA Comments: dysmetria LLE  FUNCTIONAL TESTs:  Timed up and go (TUG): 20.25 with RW Berg Balance Scale: 35/56  M-CTSIB  Condition 1: Firm Surface, EO 30 Sec, Mild Sway  Condition 2: Firm Surface, EC 30 Sec, Mild and Moderate Sway  Condition 3: Foam Surface, EO 9 Sec, Severe Sway  Condition 4: Foam Surface, EC 0 Sec,  unable  Sway    PATIENT SURVEYS:  FOTO 54.43% functional status  TODAY'S TREATMENT:  Evaluation, sidestepping along counter, tandem stance at counter   PATIENT EDUCATION: Education details: scope of PT intervention Person educated: Patient and Spouse Education method: Explanation Education comprehension: verbalized understanding   HOME EXERCISE PROGRAM: sidestepping along counter, tandem stance at counter    GOALS: Goals reviewed with patient? Yes  SHORT TERM GOALS: Target date: 05/29/2022  Patient will perform HEP with family/caregiver supervision for improved strength, balance, transfers, and gait  Baseline: Goal status: INITIAL  2.  Patient will achieve 15 seconds for TUG test to manifest reduced risk for falls Baseline: 20.25 with RW Goal status: INITIAL  3. Demo modified independent gait level surfaces and curb negotiation  Baseline: CGA-SBA with RW  Goal status: INITIAL  LONG TERM GOALS: Target date: 06/26/2022  Patient will demonstrate score 45/56 Berg Balance Test to manifest low risk for falls Baseline: 35/56 Goal status: INITIAL  2.  Demonstrate improved static balance and postural control per time of 15 sec condition 4 M-CTSIB to improve safety with mobility on uneven surfaces Baseline: complete LOB  left with condition 3 Goal status: INITIAL  3.  Demo modified independent gait using least restrictive AD over various surfaces and ascend/descend stairs with set-up assist to improve functional mobility Baseline:  Goal status: INITIAL  4.  Demo score of 57% functional status FOTO  Baseline: 54.43% Goal status: INITIAL    ASSESSMENT:  CLINICAL IMPRESSION: Patient is a 76 y.o. male who was seen today for physical therapy evaluation and treatment for unsteadiness on feet, difficulty in walking, generalized weakness, and high risk for falls following hx of CVA. Demonstrates motor control/coordination deficits, generalized weakness, high risk for falls per TUG test and Berg Balance test and reduced functional status per FOTO score indicating need for PT services to facilitate return to PLOF and reduce risk for falls   OBJECTIVE IMPAIRMENTS Abnormal gait, decreased activity tolerance, decreased balance, decreased coordination, decreased endurance, decreased knowledge of use of DME, difficulty walking, decreased strength, impaired perceived functional ability, and improper body mechanics.   ACTIVITY LIMITATIONS carrying, lifting, bending, standing, squatting, stairs, transfers, and locomotion level  PARTICIPATION LIMITATIONS: meal prep, cleaning, laundry, driving, shopping, community activity, and yard work  PERSONAL FACTORS Age, Fitness, and Time since onset of injury/illness/exacerbation are also affecting patient's functional outcome.   REHAB POTENTIAL: Excellent  CLINICAL DECISION MAKING: Evolving/moderate complexity  EVALUATION COMPLEXITY: Moderate  PLAN: PT FREQUENCY: 1-2x/week  PT DURATION: 9 weeks  PLANNED INTERVENTIONS: Therapeutic exercises, Therapeutic activity, Neuromuscular re-education, Balance training, Gait training, Patient/Family education, Joint mobilization, Stair training, Vestibular training, Canalith repositioning, DME instructions, Aquatic Therapy, Electrical  stimulation, Wheelchair mobility training, and Manual therapy  PLAN FOR NEXT SESSION: Continue with static balance in corner for safety   [employer], PT 05/01/2022, 1:07 PM

## 2022-05-01 ENCOUNTER — Ambulatory Visit: Payer: Medicare Other | Attending: Internal Medicine

## 2022-05-01 DIAGNOSIS — R29818 Other symptoms and signs involving the nervous system: Secondary | ICD-10-CM | POA: Diagnosis present

## 2022-05-01 DIAGNOSIS — R278 Other lack of coordination: Secondary | ICD-10-CM | POA: Diagnosis present

## 2022-05-01 DIAGNOSIS — R2681 Unsteadiness on feet: Secondary | ICD-10-CM | POA: Insufficient documentation

## 2022-05-01 DIAGNOSIS — M6281 Muscle weakness (generalized): Secondary | ICD-10-CM | POA: Diagnosis present

## 2022-05-01 DIAGNOSIS — R262 Difficulty in walking, not elsewhere classified: Secondary | ICD-10-CM | POA: Insufficient documentation

## 2022-05-01 DIAGNOSIS — R41841 Cognitive communication deficit: Secondary | ICD-10-CM | POA: Diagnosis present

## 2022-05-01 DIAGNOSIS — R2689 Other abnormalities of gait and mobility: Secondary | ICD-10-CM | POA: Insufficient documentation

## 2022-05-01 DIAGNOSIS — R471 Dysarthria and anarthria: Secondary | ICD-10-CM | POA: Insufficient documentation

## 2022-05-01 DIAGNOSIS — I69254 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: Secondary | ICD-10-CM | POA: Insufficient documentation

## 2022-05-02 ENCOUNTER — Encounter: Payer: Self-pay | Admitting: Occupational Therapy

## 2022-05-02 ENCOUNTER — Ambulatory Visit: Payer: Medicare Other | Admitting: Occupational Therapy

## 2022-05-02 ENCOUNTER — Inpatient Hospital Stay: Payer: Medicare Other

## 2022-05-02 ENCOUNTER — Inpatient Hospital Stay: Payer: Medicare Other | Attending: Internal Medicine

## 2022-05-02 VITALS — BP 142/68 | HR 55

## 2022-05-02 DIAGNOSIS — R278 Other lack of coordination: Secondary | ICD-10-CM

## 2022-05-02 DIAGNOSIS — R2689 Other abnormalities of gait and mobility: Secondary | ICD-10-CM

## 2022-05-02 DIAGNOSIS — R2681 Unsteadiness on feet: Secondary | ICD-10-CM | POA: Diagnosis not present

## 2022-05-02 DIAGNOSIS — I69254 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: Secondary | ICD-10-CM

## 2022-05-02 DIAGNOSIS — D469 Myelodysplastic syndrome, unspecified: Secondary | ICD-10-CM

## 2022-05-02 DIAGNOSIS — D461 Refractory anemia with ring sideroblasts: Secondary | ICD-10-CM | POA: Diagnosis present

## 2022-05-02 DIAGNOSIS — R29818 Other symptoms and signs involving the nervous system: Secondary | ICD-10-CM

## 2022-05-02 LAB — HEMOGLOBIN AND HEMATOCRIT, BLOOD
HCT: 24.9 % — ABNORMAL LOW (ref 39.0–52.0)
Hemoglobin: 7.9 g/dL — ABNORMAL LOW (ref 13.0–17.0)

## 2022-05-02 MED ORDER — EPOETIN ALFA-EPBX 20000 UNIT/ML IJ SOLN
20000.0000 [IU] | Freq: Once | INTRAMUSCULAR | Status: AC
  Administered 2022-05-02: 20000 [IU] via SUBCUTANEOUS
  Filled 2022-05-02: qty 1

## 2022-05-02 NOTE — Therapy (Unsigned)
OUTPATIENT OCCUPATIONAL THERAPY NEURO EVALUATION  Patient Name: Vernon [name] MRN: [id number] DOB:Aug 21, 1946, 76 y.o., male Today's Date: 05/02/2022  PCP: Baxter Hire, MD REFERRING PROVIDER: Baxter Hire, MD   OT End of Session - 05/02/22 1234     Visit Number 1    Authorization Type U[employer] Medicare    Authorization Time Period VL: [place]    OT Start Time 1230    OT Stop Time 1310    OT Time Calculation (min) 40 min    Behavior During Therapy WFL for tasks assessed/performed            Past Medical History:  Diagnosis Date   Anemia    Aortic stenosis    Arthritis    Complication of anesthesia    hard time getting bp up after knee replacement   Diabetes mellitus without complication (HCC)    GERD (gastroesophageal reflux disease)    occ tums prn   History of hiatal hernia    Hypertension    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[place])    Past Surgical History:  Procedure Laterality Date   CARPAL TUNNEL RELEASE  2012   CRANIOTOMY N/A 02/10/2022   Procedure: SUBOCCIPITAL CRANIECTOMY FOR EVACUATION OF CEREBELLAR HEMATOMA;  Surgeon: Kristeen Miss, MD;  Location: [place];  Service: Neurosurgery;  Laterality: N/A;   JOINT REPLACEMENT Right 2010   SHOULDER ARTHROSCOPY WITH ROTATOR CUFF REPAIR AND OPEN BICEPS TENODESIS Right 11/09/2019   Procedure: RIGHT SHOULDER ARTHROSCOPY WITH SUBSCAPULARIS REPAIR, SUBACROMIAL DECOMPRESSION,MINI OPEN ROTATOR CUFF REPAIR;  Surgeon: Leim Fabry, MD;  Location: [facility];  Service: Orthopedics;  Laterality: Right;   Patient Active Problem List   Diagnosis Date Noted   Long term current use of anticoagulant 04/04/2022   Rotator cuff syndrome 04/04/2022   Exposure to potentially hazardous substance 04/04/2022   Gout 04/04/2022   Osteoarthritis 04/04/2022   Osteoporosis 04/04/2022   Other specified diseases of hair and hair follicles 79/39/0300   Pain in joint, lower leg 04/04/2022   Problem related to unspecified psychosocial circumstances  04/04/2022   General medical examination for administrative purposes 04/04/2022   Stage 3b chronic kidney disease ([place])    Dysphagia, post-stroke    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 hemorrhage of brain ([place]) 02/26/2022   Cough 92/33/0076   Acute metabolic encephalopathy 22/63/3354   Obstructive hydrocephalus ([place]) 02/19/2022   Dyslipidemia 02/19/2022   Dysphagia 02/19/2022   AKI (acute kidney injury) ([place])    Hyperkalemia    Anemia    Pleural effusion on right    Respiratory failure requiring intubation (HCC)    Cerebral edema (HCC)    Chronic atrial fibrillation ([place])    Controlled type 2 diabetes mellitus with hyperglycemia, without long-term current use of insulin (HCC)    ICH (intracerebral hemorrhage) ([place]) 02/10/2022   Intracranial hemorrhage (HCC)    Anticoagulated    Moderate tricuspid regurgitation 07/26/2021   Moderate aortic valve stenosis 03/08/2020   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[place]) 02/18/2020   Macrocytic anemia 01/13/2020   Spleen enlargement 01/13/2020   Bilateral carotid artery stenosis 07/14/2018   Atrial fibrillation, chronic ([place]) 07/14/2018   Type 2 diabetes with nephropathy ([place]) 02/14/2016   Essential hypertension 08/16/2014   Hyperlipemia, mixed 08/16/2014   Renal insufficiency 08/16/2014    ONSET DATE: 02/10/2022  REFERRING DIAG: I62.9 (ICD-10-CM) - Nontraumatic intracranial hemorrhage, unspecified  THERAPY DIAG:  No diagnosis found.  Rationale for Evaluation and Treatment Rehabilitation  SUBJECTIVE:   SUBJECTIVE STATEMENT: *** Pt accompanied by: self and significant other ([name])  PERTINENT HISTORY: ***  PRECAUTIONS: Fall  WEIGHT BEARING RESTRICTIONS No  PAIN:  Are you having pain? No  FALLS: Has patient fallen in last 6 months? No  LIVING ENVIRONMENT: Lives with: lives with their spouse Lives in: House/apartment Stairs: No; ramped entry w/ rails Has following equipment at home: Single point cane, Environmental consultant - 2 wheeled, Environmental consultant - 4 wheeled, and  Wheelchair (manual)  PLOF: Independent  PATIENT GOALS: Be independent again; play guitar  OBJECTIVE:   HAND DOMINANCE: Right  ADLs: Overall ADLs: *** Transfers/ambulation related to ADLs: Eating: *** Grooming: *** UB Dressing: *** LB Dressing: *** Toileting: *** Bathing: *** Tub Shower transfers: *** Equipment: Shower seat with back, Grab bars, Walk in shower, and Reacher   IADLs: Shopping: Independent w/ AD for ambulation Light housekeeping: Mod I Meal Prep: Able to scramble eggs; previously was grilled Community mobility: Driving golf cart (cars, trucks, Theme park manager, Publishing copy) Medication management: Tourist information centre manager: *** Handwriting: {OTWRITTENEXPRESSION:25361}  MOBILITY STATUS: {OTMOBILITY:25360}  POSTURE COMMENTS:  {posture:25561} Sitting balance: {sitting balance:25483}  ACTIVITY TOLERANCE: Activity tolerance: ***  FUNCTIONAL OUTCOME MEASURES: FOTO: 62  UPPER EXTREMITY ROM     {AROM/PROM:27142} ROM Right eval Left eval  Shoulder flexion    Shoulder abduction    Shoulder adduction    Shoulder extension    Shoulder internal rotation    Shoulder external rotation    Elbow flexion    Elbow extension    Wrist flexion    Wrist extension    Wrist ulnar deviation    Wrist radial deviation    Wrist pronation    Wrist supination    (Blank rows = not tested)   UPPER EXTREMITY MMT:     MMT Right eval Left eval  Shoulder flexion    Shoulder abduction    Shoulder adduction    Shoulder extension    Shoulder internal rotation    Shoulder external rotation    Middle trapezius    Lower trapezius    Elbow flexion    Elbow extension    Wrist flexion    Wrist extension    Wrist ulnar deviation    Wrist radial deviation    Wrist pronation    Wrist supination    (Blank rows = not tested)  HAND FUNCTION: {handfunction:27230}  COORDINATION: Finger Nose Finger test: Right: 12 taps in 10 sec; 6 taps in 10 sec 9 Hole Peg test:  Right: 21.8  sec; Left: 51.2 sec Box and Blocks:  Right 61 blocks, Left 29 blocks  SENSATION: {sensation:27233}  EDEMA: ***  MUSCLE TONE: {UETONE:25567}  COGNITION: Overall cognitive status: {cognition:24006}  VISION: Subjective report: *** Baseline vision: {OTBASELINEVISION:25363} Visual history: {OTVISUALHISTORY:25364}  VISION ASSESSMENT: {visionassessment:27231}  Patient has difficulty with following activities due to following visual impairments: ***  PERCEPTION: {Perception:25564}  PRAXIS: {Praxis:25565}  OBSERVATIONS: ***   TODAY'S TREATMENT:  ***   PATIENT EDUCATION: Education details: *** Person educated: {Person educated:25204} Education method: {Education Method:25205} Education comprehension: {Education Comprehension:25206}   HOME EXERCISE PROGRAM: ***    GOALS: Goals reviewed with patient? {yes/no:20286}  SHORT TERM GOALS: Target date: ***  *** Baseline: Goal status: {GOALSTATUS:25110}  2.  *** Baseline:  Goal status: {GOALSTATUS:25110}  3.  *** Baseline:  Goal status: {GOALSTATUS:25110}  4.  *** Baseline:  Goal status: {GOALSTATUS:25110}  5.  *** Baseline:  Goal status: {GOALSTATUS:25110}  6.  *** Baseline:  Goal status: {GOALSTATUS:25110}  LONG TERM GOALS: Target date: ***  FOTO to 71 Baseline:  Goal status: {GOALSTATUS:25110}  2.  *** Baseline:  Goal  status: {GOALSTATUS:25110}  3.  *** Baseline:  Goal status: {GOALSTATUS:25110}  4.  *** Baseline:  Goal status: {GOALSTATUS:25110}  5.  *** Baseline:  Goal status: {GOALSTATUS:25110}  6.  *** Baseline:  Goal status: {GOALSTATUS:25110}  ASSESSMENT:  CLINICAL IMPRESSION: Patient is a *** y.o. *** who was seen today for occupational therapy evaluation for ***.   PERFORMANCE DEFICITS in functional skills including {OT physical skills:25468}, cognitive skills including {OT cognitive skills:25469}, and psychosocial skills including {OT psychosocial  skills:25470}.   IMPAIRMENTS are limiting patient from {OT performance deficits:25471}.   COMORBIDITIES {Comorbidities:25485} that affects occupational performance. Patient will benefit from skilled OT to address above impairments and improve overall function.  MODIFICATION OR ASSISTANCE TO COMPLETE EVALUATION: {OT modification:25474}  OT OCCUPATIONAL PROFILE AND HISTORY: {OT PROFILE AND HISTORY:25484}  CLINICAL DECISION MAKING: {OT CDM:25475}  REHAB POTENTIAL: {rehabpotential:25112}  EVALUATION COMPLEXITY: {Evaluation complexity:25115}    PLAN: OT FREQUENCY: {rehab frequency:25116}  OT DURATION: {rehab duration:25117}  PLANNED INTERVENTIONS: {OT Interventions:25467}  RECOMMENDED OTHER SERVICES: ***  CONSULTED AND AGREED WITH PLAN OF CARE: {WJX:91478}  PLAN FOR NEXT SESSION: Kathrine Cords, OT 05/02/2022, 12:36 PM

## 2022-05-03 ENCOUNTER — Encounter: Payer: Self-pay | Admitting: Speech Pathology

## 2022-05-03 ENCOUNTER — Ambulatory Visit: Payer: Medicare Other | Admitting: Speech Pathology

## 2022-05-03 DIAGNOSIS — R2681 Unsteadiness on feet: Secondary | ICD-10-CM | POA: Diagnosis not present

## 2022-05-03 DIAGNOSIS — R471 Dysarthria and anarthria: Secondary | ICD-10-CM

## 2022-05-03 DIAGNOSIS — R41841 Cognitive communication deficit: Secondary | ICD-10-CM

## 2022-05-03 NOTE — Therapy (Addendum)
OUTPATIENT SPEECH LANGUAGE PATHOLOGY EVALUATION   Patient Name: Vernon [name] MRN: [id number] DOB:Nov 21, 1946, 76 y.o., male Today's Date: 05/03/2022  PCP: Vernon Hire, MD  REFERRING PROVIDER: Baxter Hire, MD    End of Session - 05/03/22 1138     Visit Number 1    Number of Visits 25    Date for SLP Re-Evaluation 07/26/22    Authorization Type UHC    SLP Start Time 1100    SLP Stop Time  1145    SLP Time Calculation (min) 45 min    Activity Tolerance Patient tolerated treatment well             Past Medical History:  Diagnosis Date   Anemia    Aortic stenosis    Arthritis    Complication of anesthesia    hard time getting bp up after knee replacement   Diabetes mellitus without complication (HCC)    GERD (gastroesophageal reflux disease)    occ tums prn   History of hiatal hernia    Hypertension    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[place])    Past Surgical History:  Procedure Laterality Date   CARPAL TUNNEL RELEASE  2012   CRANIOTOMY N/A 02/10/2022   Procedure: SUBOCCIPITAL CRANIECTOMY FOR EVACUATION OF CEREBELLAR HEMATOMA;  Surgeon: Vernon Miss, MD;  Location: [place];  Service: Neurosurgery;  Laterality: N/A;   JOINT REPLACEMENT Right 2010   SHOULDER ARTHROSCOPY WITH ROTATOR CUFF REPAIR AND OPEN BICEPS TENODESIS Right 11/09/2019   Procedure: RIGHT SHOULDER ARTHROSCOPY WITH SUBSCAPULARIS REPAIR, SUBACROMIAL DECOMPRESSION,MINI OPEN ROTATOR CUFF REPAIR;  Surgeon: Vernon Fabry, MD;  Location: [facility];  Service: Orthopedics;  Laterality: Right;   Patient Active Problem List   Diagnosis Date Noted   Long term current use of anticoagulant 04/04/2022   Rotator cuff syndrome 04/04/2022   Exposure to potentially hazardous substance 04/04/2022   Gout 04/04/2022   Osteoarthritis 04/04/2022   Osteoporosis 04/04/2022   Other specified diseases of hair and hair follicles 79/39/0300   Pain in joint, lower leg 04/04/2022   Problem related to unspecified psychosocial  circumstances 04/04/2022   General medical examination for administrative purposes 04/04/2022   Stage 3b chronic kidney disease ([place])    Dysphagia, post-stroke    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 hemorrhage of brain ([place]) 02/26/2022   Cough 92/33/0076   Acute metabolic encephalopathy 22/63/3354   Obstructive hydrocephalus ([place]) 02/19/2022   Dyslipidemia 02/19/2022   Dysphagia 02/19/2022   AKI (acute kidney injury) ([place])    Hyperkalemia    Anemia    Pleural effusion on right    Respiratory failure requiring intubation (HCC)    Cerebral edema (HCC)    Chronic atrial fibrillation ([place])    Controlled type 2 diabetes mellitus with hyperglycemia, without long-term current use of insulin (HCC)    ICH (intracerebral hemorrhage) ([place]) 02/10/2022   Intracranial hemorrhage (HCC)    Anticoagulated    Moderate tricuspid regurgitation 07/26/2021   Moderate aortic valve stenosis 03/08/2020   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[place]) 02/18/2020   Macrocytic anemia 01/13/2020   Spleen enlargement 01/13/2020   Bilateral carotid artery stenosis 07/14/2018   Atrial fibrillation, chronic ([place]) 07/14/2018   Type 2 diabetes with nephropathy ([place]) 02/14/2016   Essential hypertension 08/16/2014   Hyperlipemia, mixed 08/16/2014   Renal insufficiency 08/16/2014    ONSET DATE: 02/10/22    REFERRING DIAG: I62.9 (ICD-10-CM) - Nontraumatic intracranial hemorrhage, unspecified   THERAPY DIAG:  Cognitive communication deficit  Dysarthria and anarthria  Rationale for Evaluation and Treatment Rehabilitation  SUBJECTIVE:  SUBJECTIVE STATEMENT: "My voice is hoarse since the stroke" Pt accompanied by: significant other and wife, Vernon [name]  PERTINENT HISTORY:  March 2023 experienced hemorrhage in cerebellar peduncle and underwent craniotomy for evacuation. Notes his LUE and LLE are most affected at present. Underwent acute hospitalization, and [facility]. Recently D/C from home health on 04/30/22. Did not receive HHST,  only HHPT/OT.   PAIN:  Are you having pain? No   FALLS: Has patient fallen in last 6 months?  Comment: On PT caseload  LIVING ENVIRONMENT: Lives with: lives with their spouse Lives in: House/apartment  PLOF:  Level of assistance: Independent with ADLs, Independent with IADLs Employment: Retired   PATIENT GOALS "To sing again" "To order his thoughts better"  OBJECTIVE:   DIAGNOSTIC FINDINGS: Advanced to regular diet/thin liquids on CIR  COGNITION: Overall cognitive status: Impaired: Attention: Impaired: Alternating, Divided, Memory: Impaired: Short term Auditory Visual, and Awareness: Impaired: Emergent  COGNITIVE COMMUNICATION Following directions: Follows multi-step commands consistently  Auditory comprehension: WFL Verbal expression: WFL Functional communication: WFL  ORAL MOTOR EXAMINATION Facial : WFL for tasks assessed  Cough: WFL Voice: Hoarse, Strained  STANDARDIZED ASSESSMENTS: CLQT: Attention: WNL, Memory: Mild, Executive Function: WNL, Language: WNL, Visuospatial Skills: WNL, and Clock Drawing: WNL   PATIENT REPORTED OUTCOME MEASURES (PROM): Cognitive Function: 101/140, filled out by spouse, Vernon [name]. Score of 3 or "somewhat difficulty" planning activity in advance, getting organized, remembering where things are placed, remembering name of a person, forming thoughts, slow thinking, making decisions   TODAY'S TREATMENT:  Introduced goals, trialed HEP for voice, however did not assign due to harsh onset and strain with exuberant voice therapy, with cues I modified to WNL onset, however not consistently enough to assign HEP for home.   PATIENT EDUCATION: Education details: goals, strategies for daily recall, breath support for voice Person educated: Patient and Spouse Education method: Explanation, Demonstration, Verbal cues, Handouts, and   Education comprehension: verbalized understanding, returned demonstration, verbal cues required, and needs further  education     GOALS: Goals reviewed with patient? Yes  SHORT TERM GOALS: Target date: 05/31/22 Pt will complete HEP for voice/dysarthria with occasional min A over 2 sessoins Baseline: Goal status: INITIAL  2.  Pt will use external aids to recall daily events/schedule with occasional min A from spouse over 1 week Baseline:  Goal status: INITIAL  3.  Pt will achieve clear phonation 18/20 sentences in structured task Baseline:  Goal status: INITIAL  4.  Pt will ID dysnomic errors and self correct with rare min A over 2 sessions Baseline:  Goal status: INITIAL   LONG TERM GOALS: Target date: 07/26/22 Pt will achieve clear phonation over 12 minute conversation with rare min A over 2 sessions Baseline:  Goal status: INITIAL  2.  Pt will independently recall weekly/daily schedule, appointments events and pertinent information with memory aids over 2 sessions Baseline:  Goal status: INITIAL  3.  Pt will complete vocal warm ups and follow 3 vocal hygiene strategies to return to singing for 5-10 minute periods with rare min A Baseline:  Goal status: INITIAL  4.  Improve score on Cognitive function PROM by 4 points Baseline: 101- filled out by spouse Goal status: INITIAL  ASSESSMENT:  CLINICAL IMPRESSION: Patient is a 76 y.o. male who was seen today for dysarthria/voice and cognition s/p CVA. His spouse reports worsening hoarseness since d/c from hospital. I ? If her awareness of hoarseness has increased since he has returned home. She also reports Raun is "slow  to organize his day" with him repeatedly asking her for the day's schedule and plan. She is using an external aid with limited success - I requested she bring this in to [place].  Vernon [name] is comfortable leaving [place] home alone for up to 1 hour, endorsing adequate safety awareness without supervision. She also reports Messi is using the wrong word without awareness. Her example was that he asked "when are they coming to trim the  lawnmower" instead of "tree." I did not observe this today. Today he presents with mild cognitive impairments in memory, processing and high level attention. 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 He has not been able to return to singing since CVA due to dysarthria/voice. I recommend skilled ST to maximize voice and cognition for safety, to reduce caregiver burden and QOL.   OBJECTIVE IMPAIRMENTS include attention, memory, awareness, dysarthria, and voice disorder. These impairments are limiting patient from managing appointments, household responsibilities, and effectively communicating at home and in community. Factors affecting potential to achieve goals and functional outcome are ability to learn/carryover information.. Patient will benefit from skilled SLP services to address above impairments and improve overall function.  REHAB POTENTIAL: Good  PLAN: SLP FREQUENCY: 2x/week  SLP DURATION: 12 weeks  PLANNED INTERVENTIONS: Language facilitation, Environmental controls, Cueing hierachy, Cognitive reorganization, Internal/external aids, Functional tasks, Multimodal communication approach, and SLP instruction and feedback    Strawn, Annye Rusk, [place] 05/03/2022, 1:58 PM

## 2022-05-10 ENCOUNTER — Ambulatory Visit: Payer: Medicare Other | Admitting: Occupational Therapy

## 2022-05-10 ENCOUNTER — Encounter: Payer: Self-pay | Admitting: Speech Pathology

## 2022-05-10 ENCOUNTER — Ambulatory Visit: Payer: Medicare Other

## 2022-05-10 ENCOUNTER — Ambulatory Visit: Payer: Medicare Other | Admitting: Speech Pathology

## 2022-05-10 DIAGNOSIS — R278 Other lack of coordination: Secondary | ICD-10-CM

## 2022-05-10 DIAGNOSIS — R262 Difficulty in walking, not elsewhere classified: Secondary | ICD-10-CM

## 2022-05-10 DIAGNOSIS — M6281 Muscle weakness (generalized): Secondary | ICD-10-CM

## 2022-05-10 DIAGNOSIS — R2681 Unsteadiness on feet: Secondary | ICD-10-CM | POA: Diagnosis not present

## 2022-05-10 DIAGNOSIS — I69254 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: Secondary | ICD-10-CM

## 2022-05-10 DIAGNOSIS — R471 Dysarthria and anarthria: Secondary | ICD-10-CM

## 2022-05-10 DIAGNOSIS — R2689 Other abnormalities of gait and mobility: Secondary | ICD-10-CM

## 2022-05-10 DIAGNOSIS — R41841 Cognitive communication deficit: Secondary | ICD-10-CM

## 2022-05-10 DIAGNOSIS — R29818 Other symptoms and signs involving the nervous system: Secondary | ICD-10-CM

## 2022-05-10 NOTE — Therapy (Signed)
OUTPATIENT PHYSICAL THERAPY TREATMENT NOTE   Patient Name: Vernon [name] MRN: [id number] DOB:28-May-1946, 76 y.o., male Today's Date: 05/10/2022  PCP: Baxter Hire, MD  REFERRING PROVIDER: Baxter Hire, MD   END OF SESSION:   PT End of Session - 05/10/22 0937     Visit Number 2    Number of Visits 16    Date for PT Re-Evaluation 07/03/22    Authorization Type U[employer] Medicare    Progress Note Due on Visit 10    Equipment Utilized During Treatment Gait belt    Activity Tolerance Patient tolerated treatment well;Patient limited by fatigue    Behavior During Therapy WFL for tasks assessed/performed             Past Medical History:  Diagnosis Date   Anemia    Aortic stenosis    Arthritis    Complication of anesthesia    hard time getting bp up after knee replacement   Diabetes mellitus without complication (HCC)    GERD (gastroesophageal reflux disease)    occ tums prn   History of hiatal hernia    Hypertension    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[place])    Past Surgical History:  Procedure Laterality Date   CARPAL TUNNEL RELEASE  2012   CRANIOTOMY N/A 02/10/2022   Procedure: SUBOCCIPITAL CRANIECTOMY FOR EVACUATION OF CEREBELLAR HEMATOMA;  Surgeon: Kristeen Miss, MD;  Location: [place];  Service: Neurosurgery;  Laterality: N/A;   JOINT REPLACEMENT Right 2010   SHOULDER ARTHROSCOPY WITH ROTATOR CUFF REPAIR AND OPEN BICEPS TENODESIS Right 11/09/2019   Procedure: RIGHT SHOULDER ARTHROSCOPY WITH SUBSCAPULARIS REPAIR, SUBACROMIAL DECOMPRESSION,MINI OPEN ROTATOR CUFF REPAIR;  Surgeon: Leim Fabry, MD;  Location: [facility];  Service: Orthopedics;  Laterality: Right;   Patient Active Problem List   Diagnosis Date Noted   Long term current use of anticoagulant 04/04/2022   Rotator cuff syndrome 04/04/2022   Exposure to potentially hazardous substance 04/04/2022   Gout 04/04/2022   Osteoarthritis 04/04/2022   Osteoporosis 04/04/2022   Other specified diseases of hair and  hair follicles 22/12/5425   Pain in joint, lower leg 04/04/2022   Problem related to unspecified psychosocial circumstances 04/04/2022   General medical examination for administrative purposes 04/04/2022   Stage 3b chronic kidney disease ([place])    Dysphagia, post-stroke    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 hemorrhage of brain ([place]) 02/26/2022   Cough 05/19/7627   Acute metabolic encephalopathy 31/51/7616   Obstructive hydrocephalus ([place]) 02/19/2022   Dyslipidemia 02/19/2022   Dysphagia 02/19/2022   AKI (acute kidney injury) ([place])    Hyperkalemia    Anemia    Pleural effusion on right    Respiratory failure requiring intubation (HCC)    Cerebral edema (HCC)    Chronic atrial fibrillation ([place])    Controlled type 2 diabetes mellitus with hyperglycemia, without long-term current use of insulin (HCC)    ICH (intracerebral hemorrhage) ([place]) 02/10/2022   Intracranial hemorrhage (HCC)    Anticoagulated    Moderate tricuspid regurgitation 07/26/2021   Moderate aortic valve stenosis 03/08/2020   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[place]) 02/18/2020   Macrocytic anemia 01/13/2020   Spleen enlargement 01/13/2020   Bilateral carotid artery stenosis 07/14/2018   Atrial fibrillation, chronic ([place]) 07/14/2018   Type 2 diabetes with nephropathy ([place]) 02/14/2016   Essential hypertension 08/16/2014   Hyperlipemia, mixed 08/16/2014   Renal insufficiency 08/16/2014    REFERRING DIAG: I62.9 (ICD-10-CM) - Nontraumatic intracranial hemorrhage, unspecified   THERAPY DIAG:  Other abnormalities of gait and mobility  Unsteadiness on  feet  Difficulty in walking, not elsewhere classified  Muscle weakness (generalized)  Rationale for Evaluation and Treatment Rehabilitation  PERTINENT HISTORY: see MAR  PRECAUTIONS: fall risk  SUBJECTIVE: Feeling pretty good, walked a lot yesterday. Walking some on gravel using RW in drive way  PAIN:  Are you having pain? No   OBJECTIVE:    TODAY'S TREATMENT: 05/10/22 Activity  Comments  Nu-step descending level Warm-up x1 min and 1 min at level 10, 9, 8, 7, 6 (required 2 min rest period after level 7)  Taps using Bosu  3x10 Logo for target alternating UE support  Target taps, alternate UE support to no UE support performed in parallel bars 3 targets of various height/size/quality to improve proximal support, body control, crossing midline  Foot elevated on 6" step x 15 sec No UE support, EO/EC, head turns w/ EO/EC  Sidestepping over 3 6" hurdles BUE support, RUE/LUE, no support         DIAGNOSTIC FINDINGS: see MAR   COGNITION: Overall cognitive status: Within functional limits for tasks assessed             SENSATION: WFL   COORDINATION: Bradykinesia, difficulty wit rapid alternating movements     MUSCLE TONE: WFL       POSTURE: No Significant postural limitations   LOWER EXTREMITY ROM:      WFL  (Blank rows = not tested)   LOWER EXTREMITY MMT:     MMT Right Eval Left Eval  Hip flexion 5 4  Hip extension 5 3+  Hip abduction 5 3-  Hip adduction      Hip internal rotation      Hip external rotation      Knee flexion 5 4  Knee extension 5 4  Ankle dorsiflexion 5 4+  Ankle plantarflexion      Ankle inversion      Ankle eversion      (Blank rows = not tested)   BED MOBILITY:  Independent   TRANSFERS: Assistive device utilized: Environmental consultant - 2 wheeled  Sit to stand: Modified independence Stand to sit: Modified independence Chair to chair: SBA and CGA Floor:  DNT   RAMP:  Not available   CURB:  Level of Assistance: SBA and CGA Assistive device utilized: Environmental consultant - 2 wheeled Curb Comments: undershoots with LLE   STAIRS:           Level of Assistance: SBA and CGA           Stair Negotiation Technique: Alternating Pattern  with Bilateral Rails           Number of Stairs: 6             Height of Stairs: 4-6"            Comments: dysmetria w/ LLE   GAIT: Gait pattern: circumduction- Left and ataxic Distance walked: 100 Assistive  device utilized: Walker - 2 wheeled Level of assistance: SBA Comments: dysmetria LLE   FUNCTIONAL TESTs:  Timed up and go (TUG): 20.25 with RW Berg Balance Scale: 35/56      M-CTSIB  Condition 1: Firm Surface, EO 30 Sec, Mild Sway  Condition 2: Firm Surface, EC 30 Sec, Mild and Moderate Sway  Condition 3: Foam Surface, EO 9 Sec, Severe Sway  Condition 4: Foam Surface, EC 0 Sec,  unable  Sway    PATIENT SURVEYS:  FOTO 54.43% functional status   TODAY'S TREATMENT:  Evaluation, sidestepping along counter, tandem stance at counter  PATIENT EDUCATION: Education details: scope of PT intervention Person educated: Patient and Spouse Education method: Explanation Education comprehension: verbalized understanding     HOME EXERCISE PROGRAM: sidestepping along counter, tandem stance at counter       GOALS: Goals reviewed with patient? Yes   SHORT TERM GOALS: Target date: 05/29/2022   Patient will perform HEP with family/caregiver supervision for improved strength, balance, transfers, and gait  Baseline: Goal status: INITIAL   2.  Patient will achieve 15 seconds for TUG test to manifest reduced risk for falls Baseline: 20.25 with RW Goal status: INITIAL   3. Demo modified independent gait level surfaces and curb negotiation            Baseline: CGA-SBA with RW            Goal status: INITIAL   LONG TERM GOALS: Target date: 06/26/2022   Patient will demonstrate score 45/56 Berg Balance Test to manifest low risk for falls Baseline: 35/56 Goal status: INITIAL   2.  Demonstrate improved static balance and postural control per time of 15 sec condition 4 M-CTSIB to improve safety with mobility on uneven surfaces Baseline: complete LOB left with condition 3 Goal status: INITIAL   3.  Demo modified independent gait using least restrictive AD over various surfaces and ascend/descend stairs with set-up assist to improve functional mobility Baseline:  Goal status: INITIAL   4.   Demo score of 57% functional status FOTO  Baseline: 54.43% Goal status: INITIAL       ASSESSMENT:   CLINICAL IMPRESSION: Tx initiated with metabolic conditioning via Nu-step with descending resistance with labored effort noted. Proceeded with tx skilled focus on improving left proximal stability and recruitment as well as precision of movement to improve LLE coordination with forced Wbing and crossing of midline.  Demo LOB/postural instability with LLE bias and loading with ipsilateral LOB requiring physical assist for correction each instance.  Continued sessions indicated to progress motor control/coordination and strength/dynamic balance to improve independence and sfaety with mobility and reduce risk for falls with ambulation      OBJECTIVE IMPAIRMENTS Abnormal gait, decreased activity tolerance, decreased balance, decreased coordination, decreased endurance, decreased knowledge of use of DME, difficulty walking, decreased strength, impaired perceived functional ability, and improper body mechanics.    ACTIVITY LIMITATIONS carrying, lifting, bending, standing, squatting, stairs, transfers, and locomotion level   PARTICIPATION LIMITATIONS: meal prep, cleaning, laundry, driving, shopping, community activity, and yard work   PERSONAL FACTORS Age, Fitness, and Time since onset of injury/illness/exacerbation are also affecting patient's functional outcome.    REHAB POTENTIAL: Excellent   CLINICAL DECISION MAKING: Evolving/moderate complexity   EVALUATION COMPLEXITY: Moderate   PLAN: PT FREQUENCY: 1-2x/week   PT DURATION: 9 weeks   PLANNED INTERVENTIONS: Therapeutic exercises, Therapeutic activity, Neuromuscular re-education, Balance training, Gait training, Patient/Family education, Joint mobilization, Stair training, Vestibular training, [place] repositioning, DME instructions, Aquatic Therapy, Electrical stimulation, Wheelchair mobility training, and Manual therapy   PLAN FOR NEXT  SESSION: Continue with static balance in corner for safety  10:21 AM, 05/10/22 M. Sherlyn Lees, PT, DPT Physical Therapist- [place] Office Number: [phone]

## 2022-05-10 NOTE — Patient Instructions (Signed)
Fine Motor Coordination Exercises  Perform the following exercises 1-2 times a day, as recommended by your occupational therapist.  Close all fingers and thumb into a tight fist and then open wide. (10 times) Palm of hand on table, spread fingers apart, then together. (10 times) Lift fingers and thumb off table one at a time. Increase speed as able. (10 times) Touch thumb to each fingertip. Increase speed as able. (10 times) Pick up 5 small objects (coins, marbles, paperclips, beads, etc.) one at a time and hold them in hand, then place them one by one onto the table. Pick up small objects and place them into a cup or container. Place clothespins onto the edge of a cup, can, or container. Play card games. Practice shuffling and dealing cards. Flip cards over onto table one by one.  Theraputty exercise: Thumb opposition with alternating fingers, combining a variety of patterns and fingers to simulate chords.  Start performing with putty on table top and then progress to holding putty in hand while making "chords".

## 2022-05-10 NOTE — Patient Instructions (Addendum)
  Eliminate throat clearing - use a liquid sip with a hard swallow, a forceful blow with a hard swallow or dry hard swallow  Clearing your throat irritates and damages your vocal folds, causes more mucus and  contributes to your hoarseness   Semi-occluded vocal tract exercises (SOVTE)  These allow your vocal folds to vibrate without excess tension and promotes high placement of the voice  Use SOVTE as a warm up before prolonged speaking and vocal exercises   Hum in the straw for 10 breaths  Pitch Glides for 2 minutes  Accents (siren)  Hum the [employer]  A goal would be 2-3 minutes several times a day and prior to vocal exercises  As always, use good belly breathing while completing SOVTE  Loud HAAAA with gentle onset - not harsh     We  use flow phonation to improve proper air flow during speech. Sounds  that promote air flow include: s, z, sh, th, f, v, ch. We call this "flow speech"  After you complete the semi-occluded vocal tract exercises (straw exercises & gargling), complete the following twice a day  Use a tissue to focus on airflow:  Whooo 10x Shoe 10x Collie Siad 10x   Who are you?  Who is Collie Siad?   Twenty Thirty  Forty Fifty Sixty Seventy Eighty Ninety Hundred  She sells sea shells  See Sue's shoes  Fifty-Fifty  Hit the hammer  High school hero  Hocus Pocus  To each his own  Zebra's zig zag at the zoo  [employer] chews [place] moms choose [place] prefers french fries  Vince vowed to vote  Feel the furry fish  She should polish her shoes  Show them the fresh fruit  Teachers eat ripe peaches at [employer]  Not now nor never  My mama makes me muffins  No one knows Norman's nickname  See Gay Filler Sleep soundly by the sea

## 2022-05-10 NOTE — Therapy (Signed)
OUTPATIENT OCCUPATIONAL THERAPY Treatment Note  Patient Name: Vernon [name] MRN: [id number] DOB:Mar 23, 1946, 76 y.o., male Today's Date: 05/10/2022  PCP: Baxter Hire, MD REFERRING PROVIDER: Baxter Hire, MD   OT End of Session - 05/10/22 1118     Visit Number 2    Authorization Type UHC Medicare    Authorization Time Period VL: [place]    OT Start Time 1108    OT Stop Time 1148    OT Time Calculation (min) 40 min    Behavior During Therapy WFL for tasks assessed/performed             Past Medical History:  Diagnosis Date   Anemia    Aortic stenosis    Arthritis    Complication of anesthesia    hard time getting bp up after knee replacement   Diabetes mellitus without complication (HCC)    GERD (gastroesophageal reflux disease)    occ tums prn   History of hiatal hernia    Hypertension    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[place])    Past Surgical History:  Procedure Laterality Date   CARPAL TUNNEL RELEASE  2012   CRANIOTOMY N/A 02/10/2022   Procedure: SUBOCCIPITAL CRANIECTOMY FOR EVACUATION OF CEREBELLAR HEMATOMA;  Surgeon: Kristeen Miss, MD;  Location: [place];  Service: Neurosurgery;  Laterality: N/A;   JOINT REPLACEMENT Right 2010   SHOULDER ARTHROSCOPY WITH ROTATOR CUFF REPAIR AND OPEN BICEPS TENODESIS Right 11/09/2019   Procedure: RIGHT SHOULDER ARTHROSCOPY WITH SUBSCAPULARIS REPAIR, SUBACROMIAL DECOMPRESSION,MINI OPEN ROTATOR CUFF REPAIR;  Surgeon: Leim Fabry, MD;  Location: [facility];  Service: Orthopedics;  Laterality: Right;   Patient Active Problem List   Diagnosis Date Noted   Long term current use of anticoagulant 04/04/2022   Rotator cuff syndrome 04/04/2022   Exposure to potentially hazardous substance 04/04/2022   Gout 04/04/2022   Osteoarthritis 04/04/2022   Osteoporosis 04/04/2022   Other specified diseases of hair and hair follicles 63/11/6008   Pain in joint, lower leg 04/04/2022   Problem related to unspecified psychosocial circumstances  04/04/2022   General medical examination for administrative purposes 04/04/2022   Stage 3b chronic kidney disease ([place])    Dysphagia, post-stroke    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 hemorrhage of brain ([place]) 02/26/2022   Cough 93/23/5573   Acute metabolic encephalopathy 22/12/5425   Obstructive hydrocephalus ([place]) 02/19/2022   Dyslipidemia 02/19/2022   Dysphagia 02/19/2022   AKI (acute kidney injury) ([place])    Hyperkalemia    Anemia    Pleural effusion on right    Respiratory failure requiring intubation (HCC)    Cerebral edema (HCC)    Chronic atrial fibrillation ([place])    Controlled type 2 diabetes mellitus with hyperglycemia, without long-term current use of insulin (HCC)    ICH (intracerebral hemorrhage) ([place]) 02/10/2022   Intracranial hemorrhage (HCC)    Anticoagulated    Moderate tricuspid regurgitation 07/26/2021   Moderate aortic valve stenosis 03/08/2020   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[place]) 02/18/2020   Macrocytic anemia 01/13/2020   Spleen enlargement 01/13/2020   Bilateral carotid artery stenosis 07/14/2018   Atrial fibrillation, chronic ([place]) 07/14/2018   Type 2 diabetes with nephropathy ([place]) 02/14/2016   Essential hypertension 08/16/2014   Hyperlipemia, mixed 08/16/2014   Renal insufficiency 08/16/2014    ONSET DATE: 02/10/2022  REFERRING DIAG: I62.9 (ICD-10-CM) - Nontraumatic intracranial hemorrhage, unspecified  THERAPY DIAG:  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 Pt accompanied by: self and significant other (Vernon [name])  PERTINENT  HISTORY: cerebellar hemorrhage w/ shift and early hydrocephalus; emergent decompression surgery w/ Dr. Ellene Route on 02/11/22 w/ EVD pulled 02/14/22. PMH includes DMT2, HTN, HLD, atrial fibrillation on Eliquis, arthritis, anemia, and GERD  PAIN: Are you having pain? No  PATIENT GOALS: Be independent again; play guitar  OBJECTIVE:   FUNCTIONAL OUTCOME MEASURES: FOTO: 62   TODAY'S TREATMENT:  Coordination: Engaged in [facility] and [place] with large pegs and resistive peg board.  Pt able to place pegs with increased time due to ataxia and difficulty with coordination.  Therapist providing cues for slowed pace and to visually attend to LUE when picking up and manipulating pegs.  Pt demonstrating mild improvements during second trial due to increased attention to task.   Theraputty/strengthening: Utilized theraputty for UE strengthening and coordination as pt expressing challenges with playing guitar and bass guitar.  Therapist directed pt to engage in thumb opposition with variety of fingers and combinations of fingers to simulate chords on guitar.  Pt completing with min difficulty due to decreased control and reports of "trigger finger" in 3rd and 4th digits when pressing down, resulting in decreased release.  Increased challenge to placing putty in hand and encouraging pt to complete variety of pinches to further simulate placement of hand with guitar playing.  Therapist introduced digiflex (blue - light), directing pt to complete thumb opposition and then pinch with different combinations of fingers.  Pt again limited by decreased extension and "catch" in 3rd and 4th fingers. Coordination: Engaged in hand open/close, finger abduction/adduction, finger extension from table top, and thumb opposition.  Provided pt with handout (see pt instructions) incorporating tasks from this session as well as tasks pt reports familiar with from IP rehab and home health.   PATIENT EDUCATION: Educated on use of theraputty for  coordination and strengthening for leisure pursuits as well as visual attention to increase motor control during fine and gross motor tasks. Person educated: Patient and Spouse Education method: Explanation Education comprehension: verbalized understanding   HOME EXERCISE PROGRAM: Coordination handout - see pt instructions   GOALS: Goals reviewed with patient? Yes  SHORT TERM GOALS: Target date: 06/08/22  STG  Status:  1 Pt will demonstrate independence w/ initial HEP designed for LUE GM and [place] Baseline: No HEP at this time Progressing  2 Pt will improve participation in functional bilateral FM tasks as evidenced by decreasing time to complete 9-Hole Peg Test w/ LUE by at least 6 sec Baseline: Right: 21.8  sec; Left: 51.2 sec Progressing  3 Pt will be able to complete simulated bilateral functional task (e.g. cutting food, manipulating clothing fasteners, washing dishes) within reasonable amount of time Baseline: Difficulty w/ bilateral tasks Progressing     LONG TERM GOALS: Target date: 07/06/22  LTG  Status:  1 Pt will improve participation in functional activities as evidenced by increasing FOTO score to 71 by d/c Baseline: 62 Progressing  2 Pt will demonstrate improved control and accuracy w/ LUE by completing 8 taps within 10 seconds during finger to nose test Baseline: Right: 12 taps in 10 sec; Left: 6 taps in 10 sec Progressing  3 Pt will improve Box and Blocks score to at least 40 blocks by d/c  to indicate improved unilateral GMC of LUE Baseline: Right 61 blocks, Left 29 blocks Progressing  4 Pt will safely demonstrate simulated grilling activity, incorporating compensatory strategies/AE prn, w/ Mod I by d/c Baseline: Unable to complete at this time Progressing     ASSESSMENT:  CLINICAL IMPRESSION: Treatment session with focus on fine and gross motor coordination, use of theraputty to simulate leisure tasks. Pt demonstrates ataxia with gross and fine motor tasks,  benefiting from verbal cues to slow down and visually attend to tasks to increase motor control.  Pt will continue to benefit from gross and fine motor coordination tasks, functional in nature if possible.  PERFORMANCE DEFICITS in functional skills including ADLs, IADLs, coordination, dexterity, sensation, strength, FMC, GMC, mobility, balance, body mechanics, and UE functional use.  IMPAIRMENTS are limiting patient from ADLs, IADLs, and leisure.   COMORBIDITIES has co-morbidities such as atrial fibrillation on Eliquis and DMT2  that affects occupational performance. Patient will benefit from skilled OT to address above impairments and improve overall function.  MODIFICATION OR ASSISTANCE TO COMPLETE EVALUATION: Min-Moderate modification of tasks or assist with assess necessary to complete an evaluation.  OT OCCUPATIONAL PROFILE AND HISTORY: Detailed assessment: Review of records and additional review of physical, cognitive, psychosocial history related to current functional performance.  CLINICAL DECISION MAKING: Moderate - several treatment options, min-mod task modification necessary  REHAB POTENTIAL: Good  EVALUATION COMPLEXITY: Moderate   PLAN: OT FREQUENCY: 2x/week  OT DURATION: 8 weeks  PLANNED INTERVENTIONS: self care/ADL training, therapeutic exercise, therapeutic activity, neuromuscular re-education, manual therapy, functional mobility training, aquatic therapy, electrical stimulation, ultrasound, biofeedback, moist heat, cryotherapy, patient/family education, energy conservation, and DME and/or AE instructions  RECOMMENDED OTHER SERVICES: To receive PT/ST services at this location  CONSULTED AND AGREED WITH PLAN OF CARE: Patient and family member/caregiver  PLAN FOR NEXT SESSION:  GMC/FMC activities for LUE (weight-bearing, closed-chain exercise, coordination activities, etc)   Omar Gayden, [place], OTR/L 05/10/2022, 3:13 PM

## 2022-05-10 NOTE — Therapy (Signed)
OUTPATIENT SPEECH LANGUAGE PATHOLOGY TREATMENT NOTE   Patient Name: Vernon [name] MRN: [id number] DOB:1946/09/18, 76 y.o., male Today's Date: 05/10/2022  PCP: Baxter Hire, MD  REFERRING PROVIDER: Baxter Hire, MD   END OF SESSION:   End of Session - 05/10/22 1003     Visit Number 2    Number of Visits 25    Date for SLP Re-Evaluation 07/26/22    Authorization Type UHC    SLP Start Time 1015    SLP Stop Time  1100    SLP Time Calculation (min) 45 min    Activity Tolerance Patient tolerated treatment well;Patient limited by fatigue             Past Medical History:  Diagnosis Date   Anemia    Aortic stenosis    Arthritis    Complication of anesthesia    hard time getting bp up after knee replacement   Diabetes mellitus without complication (HCC)    GERD (gastroesophageal reflux disease)    occ tums prn   History of hiatal hernia    Hypertension    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[place])    Past Surgical History:  Procedure Laterality Date   CARPAL TUNNEL RELEASE  2012   CRANIOTOMY N/A 02/10/2022   Procedure: SUBOCCIPITAL CRANIECTOMY FOR EVACUATION OF CEREBELLAR HEMATOMA;  Surgeon: Kristeen Miss, MD;  Location: [facility];  Service: Neurosurgery;  Laterality: N/A;   JOINT REPLACEMENT Right 2010   SHOULDER ARTHROSCOPY WITH ROTATOR CUFF REPAIR AND OPEN BICEPS TENODESIS Right 11/09/2019   Procedure: RIGHT SHOULDER ARTHROSCOPY WITH SUBSCAPULARIS REPAIR, SUBACROMIAL DECOMPRESSION,MINI OPEN ROTATOR CUFF REPAIR;  Surgeon: Leim Fabry, MD;  Location: [facility];  Service: Orthopedics;  Laterality: Right;   Patient Active Problem List   Diagnosis Date Noted   Long term current use of anticoagulant 04/04/2022   Rotator cuff syndrome 04/04/2022   Exposure to potentially hazardous substance 04/04/2022   Gout 04/04/2022   Osteoarthritis 04/04/2022   Osteoporosis 04/04/2022   Other specified diseases of hair and hair follicles 83/41/9622   Pain in joint, lower leg 04/04/2022    Problem related to unspecified psychosocial circumstances 04/04/2022   General medical examination for administrative purposes 04/04/2022   Stage 3b chronic kidney disease ([place])    Dysphagia, post-stroke    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 hemorrhage of brain ([place]) 02/26/2022   Cough 29/79/8921   Acute metabolic encephalopathy 19/41/7408   Obstructive hydrocephalus ([place]) 02/19/2022   Dyslipidemia 02/19/2022   Dysphagia 02/19/2022   AKI (acute kidney injury) ([place])    Hyperkalemia    Anemia    Pleural effusion on right    Respiratory failure requiring intubation (HCC)    Cerebral edema (HCC)    Chronic atrial fibrillation ([place])    Controlled type 2 diabetes mellitus with hyperglycemia, without long-term current use of insulin (HCC)    ICH (intracerebral hemorrhage) ([place]) 02/10/2022   Intracranial hemorrhage (HCC)    Anticoagulated    Moderate tricuspid regurgitation 07/26/2021   Moderate aortic valve stenosis 03/08/2020   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[place]) 02/18/2020   Macrocytic anemia 01/13/2020   Spleen enlargement 01/13/2020   Bilateral carotid artery stenosis 07/14/2018   Atrial fibrillation, chronic ([place]) 07/14/2018   Type 2 diabetes with nephropathy ([place]) 02/14/2016   Essential hypertension 08/16/2014   Hyperlipemia, mixed 08/16/2014   Renal insufficiency 08/16/2014    PERTINENT HISTORY:  March 2023 experienced hemorrhage in cerebellar peduncle and underwent craniotomy for evacuation. Notes his LUE and LLE are most affected at present. Underwent acute hospitalization,  and [facility]. Recently D/C from home health on 04/30/22. Did not receive HHST, only HHPT/OT.     ONSET DATE: 02/10/22   REFERRING DIAG: I62.9 (ICD-10-CM) - Nontraumatic intracranial hemorrhage, unspecified   THERAPY DIAG:  Cognitive communication deficit  Dysarthria and anarthria  Rationale for Evaluation and Treatment Rehabilitation  SUBJECTIVE: "My shoulder hurts from using the bars"  PAIN:  Are  you having pain? Yes NPRS scale: 3/10 Pain location: right shoulder Pain orientation: Right  PAIN TYPE: aching Pain description: intermittent  Aggravating factors: exercise Relieving factors: rest     OBJECTIVE:   TODAY'S TREATMENT:  05/10/22: Vernon [name] reports he is using a calendar in the kitchen to keep track of his appointments and schedules. He feels he is asking Vernon [name] less about the day and is able recall using the calendar. Vernon [name] not present to confirm. He recalled his family is coming tomorrow to celebrate his birthday, however he required cues to check his calendar in his folder, as he stated he was done after ST, but looking at calendar, he ID'd that he had OT after ST. Voice remains harsh and hoarse, with frequent throat clears. Trained Vernon [name] on throat clear alternatives, which he demonstrated with mod verbal cues and modeling. He required cues throughout session to ID when he cleared his throat. Initiated training in semi occluded vocal tract exercises (SOVTE) to reduce laryngeal tension with occasional min verbal cues and modeling, cues to reduce shoulder tension. Loud HHAA with easy onset completed with clear phonation 10/10x, then 5/5x at end of session with rare min A. Targeted phrases with flow phonation sounds to reduce harsh voice with usual min to mod verbal cues and modeling. Added SOVTE and moderately loud HA to HEP. Instructed his spouse, after session to let Vernon [name] know when he clears his throat. Throat clearing 7x this session, habitual prior to talking or some exercises  05/03/22: Introduced goals, trialed HEP for voice, however did not assign due to harsh onset and strain with exuberant voice therapy, with cues I modified to WNL onset, however not consistently enough to assign HEP for home.     PATIENT EDUCATION: Education details: goals, strategies for daily recall, breath support for voice Person educated: Patient and Spouse Education method: Explanation, Demonstration,  Verbal cues, Handouts, and   Education comprehension: verbalized understanding, returned demonstration, verbal cues required, and needs further education         GOALS: Goals reviewed with patient? Yes   SHORT TERM GOALS: Target date: 05/31/22 Pt will complete HEP for voice/dysarthria with occasional min A over 2 sessoins Baseline: Goal status: Ongoing   2.  Pt will use external aids to recall daily events/schedule with occasional min A from spouse over 1 week Baseline:  Goal status: Ongoing   3.  Pt will achieve clear phonation 18/20 sentences in structured task Baseline:  Goal status: Ongoing   4.  Pt will ID dysnomic errors and self correct with rare min A over 2 sessions Baseline:  Goal status: Ongoing     LONG TERM GOALS: Target date: 07/26/22 Pt will achieve clear phonation over 12 minute conversation with rare min A over 2 sessions Baseline:  Goal status:Ongoing   2.  Pt will independently recall weekly/daily schedule, appointments events and pertinent information with memory aids over 2 sessions Baseline:  Goal status: Ongoing   3.  Pt will complete vocal warm ups and follow 3 vocal hygiene strategies to return to singing for 5-10 minute periods with rare min A  Baseline:  Goal status: Ongoing   4.  Improve score on Cognitive function PROM by 4 points Baseline: 101- filled out by spouse Goal status: IOngoing   ASSESSMENT:   CLINICAL IMPRESSION: Patient is a 76 y.o. male who was seen today for dysarthria/voice and cognition s/p CVA. His spouse reports worsening hoarseness since d/c from hospital. I ? If her awareness of hoarseness has increased since he has returned home. She also reports Keishawn is "slow to organize his day" with him repeatedly asking her for the day's schedule and plan. She is using an external aid with limited success - I requested she bring this in to [place].  Vernon [name] is comfortable leaving [place] home alone for up to 1 hour, endorsing adequate safety  awareness without supervision. She also reports Lamari is using the wrong word without awareness. Her example was that he asked "when are they coming to trim the lawnmower" instead of "tree." I did not observe this today. Today he presents with mild cognitive impairments in memory, processing and high level attention. 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 He has not been able to return to singing since CVA due to dysarthria/voice. I recommend skilled ST to maximize voice and cognition for safety, to reduce caregiver burden and QOL.    OBJECTIVE IMPAIRMENTS include attention, memory, awareness, dysarthria, and voice disorder. These impairments are limiting patient from managing appointments, household responsibilities, and effectively communicating at home and in community. Factors affecting potential to achieve goals and functional outcome are ability to learn/carryover information.. Patient will benefit from skilled SLP services to address above impairments and improve overall function.   REHAB POTENTIAL: Good   PLAN: SLP FREQUENCY: 2x/week   SLP DURATION: 12 weeks   PLANNED INTERVENTIONS: Language facilitation, Environmental controls, Cueing hierachy, Cognitive reorganization, Internal/external aids, Functional tasks, Multimodal communication approach, and SLP instruction and feedback             [place], Annye Rusk, [place] 05/10/2022, 12:01 PM

## 2022-05-15 ENCOUNTER — Encounter: Payer: Self-pay | Admitting: Occupational Therapy

## 2022-05-15 ENCOUNTER — Ambulatory Visit: Payer: Medicare Other

## 2022-05-15 ENCOUNTER — Ambulatory Visit: Payer: Medicare Other | Admitting: Occupational Therapy

## 2022-05-15 DIAGNOSIS — R2681 Unsteadiness on feet: Secondary | ICD-10-CM

## 2022-05-15 DIAGNOSIS — M6281 Muscle weakness (generalized): Secondary | ICD-10-CM

## 2022-05-15 DIAGNOSIS — I69254 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: Secondary | ICD-10-CM

## 2022-05-15 DIAGNOSIS — R29818 Other symptoms and signs involving the nervous system: Secondary | ICD-10-CM

## 2022-05-15 DIAGNOSIS — R2689 Other abnormalities of gait and mobility: Secondary | ICD-10-CM

## 2022-05-15 DIAGNOSIS — R278 Other lack of coordination: Secondary | ICD-10-CM

## 2022-05-15 DIAGNOSIS — R262 Difficulty in walking, not elsewhere classified: Secondary | ICD-10-CM

## 2022-05-15 NOTE — Therapy (Signed)
OUTPATIENT PHYSICAL THERAPY TREATMENT NOTE   Patient Name: Vernon [name] MRN: [id number] DOB:Feb 28, 1946, 76 y.o., male Today's Date: 05/15/2022  PCP: Baxter Hire, MD  REFERRING PROVIDER: Baxter Hire, MD   END OF SESSION:   PT End of Session - 05/15/22 1025     Visit Number 3    Number of Visits 16    Date for PT Re-Evaluation 07/03/22    Authorization Type UHC Medicare    Progress Note Due on Visit 10    PT Start Time 1015    PT Stop Time 1100    PT Time Calculation (min) 45 min    Equipment Utilized During Treatment Gait belt    Activity Tolerance Patient tolerated treatment well;Patient limited by fatigue    Behavior During Therapy WFL for tasks assessed/performed             Past Medical History:  Diagnosis Date   Anemia    Aortic stenosis    Arthritis    Complication of anesthesia    hard time getting bp up after knee replacement   Diabetes mellitus without complication (HCC)    GERD (gastroesophageal reflux disease)    occ tums prn   History of hiatal hernia    Hypertension    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[place])    Past Surgical History:  Procedure Laterality Date   CARPAL TUNNEL RELEASE  2012   CRANIOTOMY N/A 02/10/2022   Procedure: SUBOCCIPITAL CRANIECTOMY FOR EVACUATION OF CEREBELLAR HEMATOMA;  Surgeon: Kristeen Miss, MD;  Location: [facility];  Service: Neurosurgery;  Laterality: N/A;   JOINT REPLACEMENT Right 2010   SHOULDER ARTHROSCOPY WITH ROTATOR CUFF REPAIR AND OPEN BICEPS TENODESIS Right 11/09/2019   Procedure: RIGHT SHOULDER ARTHROSCOPY WITH SUBSCAPULARIS REPAIR, SUBACROMIAL DECOMPRESSION,MINI OPEN ROTATOR CUFF REPAIR;  Surgeon: Leim Fabry, MD;  Location: [facility];  Service: Orthopedics;  Laterality: Right;   Patient Active Problem List   Diagnosis Date Noted   Long term current use of anticoagulant 04/04/2022   Rotator cuff syndrome 04/04/2022   Exposure to potentially hazardous substance 04/04/2022   Gout 04/04/2022   Osteoarthritis  04/04/2022   Osteoporosis 04/04/2022   Other specified diseases of hair and hair follicles 30/16/0109   Pain in joint, lower leg 04/04/2022   Problem related to unspecified psychosocial circumstances 04/04/2022   General medical examination for administrative purposes 04/04/2022   Stage 3b chronic kidney disease ([place])    Dysphagia, post-stroke    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 hemorrhage of brain ([place]) 02/26/2022   Cough 32/35/5732   Acute metabolic encephalopathy 20/25/4270   Obstructive hydrocephalus ([place]) 02/19/2022   Dyslipidemia 02/19/2022   Dysphagia 02/19/2022   AKI (acute kidney injury) ([place])    Hyperkalemia    Anemia    Pleural effusion on right    Respiratory failure requiring intubation (HCC)    Cerebral edema (HCC)    Chronic atrial fibrillation ([place])    Controlled type 2 diabetes mellitus with hyperglycemia, without long-term current use of insulin (HCC)    ICH (intracerebral hemorrhage) ([place]) 02/10/2022   Intracranial hemorrhage (HCC)    Anticoagulated    Moderate tricuspid regurgitation 07/26/2021   Moderate aortic valve stenosis 03/08/2020   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[place]) 02/18/2020   Macrocytic anemia 01/13/2020   Spleen enlargement 01/13/2020   Bilateral carotid artery stenosis 07/14/2018   Atrial fibrillation, chronic ([place]) 07/14/2018   Type 2 diabetes with nephropathy ([place]) 02/14/2016   Essential hypertension 08/16/2014   Hyperlipemia, mixed 08/16/2014   Renal insufficiency 08/16/2014  REFERRING DIAG: I62.9 (ICD-10-CM) - Nontraumatic intracranial hemorrhage, unspecified   THERAPY DIAG:  Other abnormalities of gait and mobility  Unsteadiness on feet  Difficulty in walking, not elsewhere classified  Muscle weakness (generalized)  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 No   OBJECTIVE:    TODAY'S TREATMENT: 05/15/22 Activity Comments  Nu-step for self-paced warm-up x6 min   Colored dots stepping activity x 6 min Ipsilateral and contralateral taps; BUE support, single UE, finger tip  DKTC and SKTC 3x60 sec   LTR x 3 min   Sidestepping over 3 6" hurdles BUE support, RUE/LUE, no support         DIAGNOSTIC FINDINGS: see MAR   COGNITION: Overall cognitive status: Within functional limits for tasks assessed             SENSATION: WFL   COORDINATION: Bradykinesia, difficulty wit rapid alternating movements     MUSCLE TONE: WFL       POSTURE: No Significant postural limitations   LOWER EXTREMITY ROM:      WFL  (Blank rows = not tested)   LOWER EXTREMITY MMT:     MMT Right Eval Left Eval  Hip flexion 5 4  Hip extension 5 3+  Hip abduction 5 3-  Hip adduction      Hip internal rotation      Hip external rotation      Knee flexion 5 4  Knee extension 5 4  Ankle dorsiflexion 5 4+  Ankle plantarflexion      Ankle inversion      Ankle eversion      (Blank rows = not tested)   BED MOBILITY:  Independent   TRANSFERS: Assistive device utilized: Environmental consultant - 2 wheeled  Sit to stand: Modified independence Stand to sit: Modified independence Chair to chair: SBA and CGA Floor:  DNT   RAMP:  Not available   CURB:  Level of Assistance: SBA and CGA Assistive device utilized: Environmental consultant - 2 wheeled Curb Comments: undershoots with LLE   STAIRS:           Level of Assistance: SBA and CGA           Stair Negotiation Technique: Alternating Pattern  with Bilateral Rails           Number of Stairs: 6             Height of Stairs: 4-6"            Comments: dysmetria w/ LLE   GAIT: Gait pattern: circumduction- Left and ataxic Distance walked: 100 Assistive device utilized: Walker - 2 wheeled Level of assistance: SBA Comments: dysmetria LLE   FUNCTIONAL  TESTs:  Timed up and go (TUG): 20.25 with RW Berg Balance Scale: 35/56      M-CTSIB  Condition 1: Firm Surface, EO 30 Sec, Mild Sway  Condition 2: Firm Surface, EC 30 Sec, Mild and Moderate Sway  Condition 3: Foam Surface, EO 9 Sec, Severe Sway  Condition 4: Foam Surface, EC 0 Sec,  unable  Sway    PATIENT SURVEYS:  FOTO 54.43% functional status   TODAY'S TREATMENT:  Evaluation, sidestepping along counter, tandem stance at counter     PATIENT EDUCATION: Education details: scope of PT intervention Person educated: Patient and Spouse Education  method: Explanation Education comprehension: verbalized understanding     HOME EXERCISE PROGRAM: sidestepping along counter, tandem stance at counter       GOALS: Goals reviewed with patient? Yes   SHORT TERM GOALS: Target date: 05/29/2022   Patient will perform HEP with family/caregiver supervision for improved strength, balance, transfers, and gait  Baseline: Goal status: INITIAL   2.  Patient will achieve 15 seconds for TUG test to manifest reduced risk for falls Baseline: 20.25 with RW Goal status: INITIAL   3. Demo modified independent gait level surfaces and curb negotiation            Baseline: CGA-SBA with RW            Goal status: INITIAL   LONG TERM GOALS: Target date: 06/26/2022   Patient will demonstrate score 45/56 Berg Balance Test to manifest low risk for falls Baseline: 35/56 Goal status: INITIAL   2.  Demonstrate improved static balance and postural control per time of 15 sec condition 4 M-CTSIB to improve safety with mobility on uneven surfaces Baseline: complete LOB left with condition 3 Goal status: INITIAL   3.  Demo modified independent gait using least restrictive AD over various surfaces and ascend/descend stairs with set-up assist to improve functional mobility Baseline:  Goal status: INITIAL   4.  Demo score of 57% functional status FOTO  Baseline: 54.43% Goal status: INITIAL        ASSESSMENT:   CLINICAL IMPRESSION: Pt notes general fatigue today and increase in low back discomfort.  Improved coordination with stepping drill today with LE precision and decrease in ataxia/overshooting.  Acitivit ytolerance limited due to back pain and end of session focus on assisted flexion-bias stretching with report of decreased pain at end of session     OBJECTIVE IMPAIRMENTS Abnormal gait, decreased activity tolerance, decreased balance, decreased coordination, decreased endurance, decreased knowledge of use of DME, difficulty walking, decreased strength, impaired perceived functional ability, and improper body mechanics.    ACTIVITY LIMITATIONS carrying, lifting, bending, standing, squatting, stairs, transfers, and locomotion level   PARTICIPATION LIMITATIONS: meal prep, cleaning, laundry, driving, shopping, community activity, and yard work   PERSONAL FACTORS Age, Fitness, and Time since onset of injury/illness/exacerbation are also affecting patient's functional outcome.    REHAB POTENTIAL: Excellent   CLINICAL DECISION MAKING: Evolving/moderate complexity   EVALUATION COMPLEXITY: Moderate   PLAN: PT FREQUENCY: 1-2x/week   PT DURATION: 9 weeks   PLANNED INTERVENTIONS: Therapeutic exercises, Therapeutic activity, Neuromuscular re-education, Balance training, Gait training, Patient/Family education, Joint mobilization, Stair training, Vestibular training, Canalith repositioning, DME instructions, Aquatic Therapy, Electrical stimulation, Wheelchair mobility training, and Manual therapy   PLAN FOR NEXT SESSION: Continue with static balance in corner for safety  10:25 AM, 05/15/22 M. Sherlyn Lees, PT, DPT Physical Therapist- [place] Office Number: [phone]

## 2022-05-15 NOTE — Therapy (Signed)
OUTPATIENT OCCUPATIONAL THERAPY Treatment Note  Patient Name: JAYIN DEROUSSE MRN: [id number] DOB:Feb 23, 1946, 76 y.o., male Today's Date: 05/15/2022  PCP: Baxter Hire, MD REFERRING PROVIDER: Baxter Hire, MD   OT End of Session - 05/15/22 1301     Visit Number 3    Authorization Type UHC Medicare    Authorization Time Period VL: [place]    OT Start Time 0930    OT Stop Time 1015    OT Time Calculation (min) 45 min    Activity Tolerance Patient tolerated treatment well    Behavior During Therapy WFL for tasks assessed/performed              Past Medical History:  Diagnosis Date   Anemia    Aortic stenosis    Arthritis    Complication of anesthesia    hard time getting bp up after knee replacement   Diabetes mellitus without complication (HCC)    GERD (gastroesophageal reflux disease)    occ tums prn   History of hiatal hernia    Hypertension    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[place])    Past Surgical History:  Procedure Laterality Date   CARPAL TUNNEL RELEASE  2012   CRANIOTOMY N/A 02/10/2022   Procedure: SUBOCCIPITAL CRANIECTOMY FOR EVACUATION OF CEREBELLAR HEMATOMA;  Surgeon: Kristeen Miss, MD;  Location: [facility];  Service: Neurosurgery;  Laterality: N/A;   JOINT REPLACEMENT Right 2010   SHOULDER ARTHROSCOPY WITH ROTATOR CUFF REPAIR AND OPEN BICEPS TENODESIS Right 11/09/2019   Procedure: RIGHT SHOULDER ARTHROSCOPY WITH SUBSCAPULARIS REPAIR, SUBACROMIAL DECOMPRESSION,MINI OPEN ROTATOR CUFF REPAIR;  Surgeon: Leim Fabry, MD;  Location: [facility];  Service: Orthopedics;  Laterality: Right;   Patient Active Problem List   Diagnosis Date Noted   Long term current use of anticoagulant 04/04/2022   Rotator cuff syndrome 04/04/2022   Exposure to potentially hazardous substance 04/04/2022   Gout 04/04/2022   Osteoarthritis 04/04/2022   Osteoporosis 04/04/2022   Other specified diseases of hair and hair follicles 70/35/0093   Pain in joint, lower leg 04/04/2022    Problem related to unspecified psychosocial circumstances 04/04/2022   General medical examination for administrative purposes 04/04/2022   Stage 3b chronic kidney disease ([place])    Dysphagia, post-stroke    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 hemorrhage of brain ([place]) 02/26/2022   Cough 81/82/9937   Acute metabolic encephalopathy 16/96/7893   Obstructive hydrocephalus ([place]) 02/19/2022   Dyslipidemia 02/19/2022   Dysphagia 02/19/2022   AKI (acute kidney injury) ([place])    Hyperkalemia    Anemia    Pleural effusion on right    Respiratory failure requiring intubation (HCC)    Cerebral edema (HCC)    Chronic atrial fibrillation ([place])    Controlled type 2 diabetes mellitus with hyperglycemia, without long-term current use of insulin (HCC)    ICH (intracerebral hemorrhage) ([place]) 02/10/2022   Intracranial hemorrhage (HCC)    Anticoagulated    Moderate tricuspid regurgitation 07/26/2021   Moderate aortic valve stenosis 03/08/2020   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[place]) 02/18/2020   Macrocytic anemia 01/13/2020   Spleen enlargement 01/13/2020   Bilateral carotid artery stenosis 07/14/2018   Atrial fibrillation, chronic ([place]) 07/14/2018   Type 2 diabetes with nephropathy ([place]) 02/14/2016   Essential hypertension 08/16/2014   Hyperlipemia, mixed 08/16/2014   Renal insufficiency 08/16/2014    ONSET DATE: 02/10/2022  REFERRING DIAG: I62.9 (ICD-10-CM) - Nontraumatic intracranial hemorrhage, unspecified  THERAPY DIAG:  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  Pt accompanied by: self and significant other ([name])  PERTINENT HISTORY:  cerebellar hemorrhage w/ shift and early hydrocephalus; emergent decompression surgery w/ Dr. Ellene Route on 02/11/22 w/ EVD pulled 02/14/22. PMH includes DMT2, HTN, HLD, atrial fibrillation on Eliquis, arthritis, anemia, and GERD  PAIN: Are you having pain? Yes low back and left knee pain. No pain if not moving. 0/10 right now sitting up in w/c. Rest helps decrease pain, certain movement and walking increases pain.  PATIENT GOALS: Be independent again; play guitar  OBJECTIVE:   FUNCTIONAL OUTCOME MEASURES: FOTO: 25   TODAY'S TREATMENT:    Coordination: Engaged in [place] exercises utilizing putty, Small pegs in yellow putty, burying and removing, placing tip of small peg into putty with moderate difficulty noted. Pt encouraged to stabilize forearm on table and use vision to assist with hand positioning during in hand manipulation and placement. He was able to place pegs with increased time due to ataxia and difficulty with coordination.  Therapist again provided verbal cues for slowed pace and to visually attend to LUE when picking up and manipulating pegs as pt appeared to rush through tasks. Graded clothes pins, placing on top two rows horizontally and then vertically with better control in left UE following WB activity performed just prior.  Performed weightbearing activity through left UE in various planes. Place/press/weight shift L UE and hold x10-15 seconds x10 reps. Discussed implementing WB activity into HEP secondary to ataxia. Discussed performing at home sitting at table or when sitting in chair or on EOB. Pt was advised to not initially perform in standing at countertop/table unless spouse present to assist. Pt and spouse verbalized understanding of this in the clinic today.   PATIENT EDUCATION: Educated on use of theraputty for coordination and strengthening for leisure pursuits as well as visual attention to increase motor control during fine and gross motor tasks. Person educated:  Patient and Spouse Education method: Explanation Education comprehension: verbalized understanding   HOME EXERCISE PROGRAM: Coordination handout - see pt instructions   GOALS: Goals reviewed with patient? Yes  SHORT TERM GOALS: Target date: 06/08/22  STG  Status:  1 Pt will demonstrate independence w/ initial HEP designed for LUE GM and [place] Baseline: No HEP at this time Progressing  2 Pt will improve participation in functional bilateral FM tasks as evidenced by decreasing time to complete 9-Hole Peg Test w/ LUE by at least 6 sec Baseline: Right: 21.8  sec; Left: 51.2 sec Progressing  3 Pt will be able to complete simulated bilateral functional task (e.g. cutting food, manipulating clothing fasteners, washing dishes) within reasonable amount of time Baseline: Difficulty w/ bilateral tasks Progressing     LONG TERM GOALS: Target date: 07/06/22  LTG  Status:  1 Pt will improve participation in functional activities as evidenced by increasing FOTO score to 71 by d/c Baseline: 62 Progressing  2 Pt will demonstrate improved control and accuracy w/ LUE by completing 8 taps within 10 seconds during finger to nose test Baseline: Right: 12 taps in 10 sec; Left: 6 taps in 10 sec Progressing  3 Pt will improve Box and Blocks score to at least 40 blocks by d/c to indicate improved unilateral GMC of LUE Baseline: Right 61 blocks,  Left 29 blocks Progressing  4 Pt will safely demonstrate simulated grilling activity, incorporating compensatory strategies/AE prn, w/ Mod I by d/c Baseline: Unable to complete at this time Progressing     ASSESSMENT:  CLINICAL IMPRESSION:  Treatment session with focus on fine and gross motor coordination, use of theraputty and WB techniques/tasks for LUE. Pt demonstrates ataxia with gross and fine motor tasks, benefiting from verbal cues to slow down and visually attend to tasks to increase motor control.  Pt will continue to benefit from gross and fine motor  coordination tasks as well as opportunities for WB through LUE.  PERFORMANCE DEFICITS in functional skills including ADLs, IADLs, coordination, dexterity, sensation, strength, FMC, GMC, mobility, balance, body mechanics, and UE functional use.  IMPAIRMENTS are limiting patient from ADLs, IADLs, and leisure.   COMORBIDITIES has co-morbidities such as atrial fibrillation on Eliquis and DMT2  that affects occupational performance. Patient will benefit from skilled OT to address above impairments and improve overall function.  MODIFICATION OR ASSISTANCE TO COMPLETE EVALUATION: Min-Moderate modification of tasks or assist with assess necessary to complete an evaluation.  OT OCCUPATIONAL PROFILE AND HISTORY: Detailed assessment: Review of records and additional review of physical, cognitive, psychosocial history related to current functional performance.  CLINICAL DECISION MAKING: Moderate - several treatment options, min-mod task modification necessary  REHAB POTENTIAL: Good  EVALUATION COMPLEXITY: Moderate   PLAN: OT FREQUENCY: 2x/week  OT DURATION: 8 weeks  PLANNED INTERVENTIONS: self care/ADL training, therapeutic exercise, therapeutic activity, neuromuscular re-education, manual therapy, functional mobility training, aquatic therapy, electrical stimulation, ultrasound, biofeedback, moist heat, cryotherapy, patient/family education, energy conservation, and DME and/or AE instructions  RECOMMENDED OTHER SERVICES: To receive PT/ST services at this location  CONSULTED AND AGREED WITH PLAN OF CARE: Patient and family member/caregiver  PLAN FOR NEXT SESSION:    GMC/FMC activities for LUE. Seated vs standing weight-bearing, closed-chain exercise, coordination activities, etc.   Mohammed Mcandrew Beth Dixon, OTR/L 05/15/2022, 1:03 PM

## 2022-05-16 ENCOUNTER — Other Ambulatory Visit: Payer: Self-pay

## 2022-05-16 ENCOUNTER — Inpatient Hospital Stay: Payer: Medicare Other

## 2022-05-16 ENCOUNTER — Other Ambulatory Visit: Payer: Medicare Other

## 2022-05-16 ENCOUNTER — Ambulatory Visit: Payer: Medicare Other

## 2022-05-16 DIAGNOSIS — D461 Refractory anemia with ring sideroblasts: Secondary | ICD-10-CM | POA: Diagnosis not present

## 2022-05-16 DIAGNOSIS — D469 Myelodysplastic syndrome, unspecified: Secondary | ICD-10-CM

## 2022-05-16 LAB — HEMOGLOBIN AND HEMATOCRIT, BLOOD
HCT: 28.2 % — ABNORMAL LOW (ref 39.0–52.0)
Hemoglobin: 9 g/dL — ABNORMAL LOW (ref 13.0–17.0)

## 2022-05-16 MED ORDER — EPOETIN ALFA-EPBX 10000 UNIT/ML IJ SOLN
20000.0000 [IU] | Freq: Once | INTRAMUSCULAR | Status: AC
  Administered 2022-05-16: 20000 [IU] via SUBCUTANEOUS
  Filled 2022-05-16: qty 2

## 2022-05-16 NOTE — Therapy (Signed)
OUTPATIENT SPEECH LANGUAGE PATHOLOGY TREATMENT NOTE   Patient Name: Vernon [name] MRN: [id number] DOB:03/27/1946, 76 y.o., male Today's Date: 05/17/2022  PCP: Baxter Hire, MD  REFERRING PROVIDER: Baxter Hire, MD   END OF SESSION:   End of Session - 05/17/22 1244     Visit Number 3    Number of Visits 25    Date for SLP Re-Evaluation 07/26/22    Authorization Type UHC    SLP Start Time 1151    SLP Stop Time  1231    SLP Time Calculation (min) 40 min    Activity Tolerance Patient tolerated treatment well              Past Medical History:  Diagnosis Date   Anemia    Aortic stenosis    Arthritis    Complication of anesthesia    hard time getting bp up after knee replacement   Diabetes mellitus without complication (HCC)    GERD (gastroesophageal reflux disease)    occ tums prn   History of hiatal hernia    Hypertension    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[place])    Past Surgical History:  Procedure Laterality Date   CARPAL TUNNEL RELEASE  2012   CRANIOTOMY N/A 02/10/2022   Procedure: SUBOCCIPITAL CRANIECTOMY FOR EVACUATION OF CEREBELLAR HEMATOMA;  Surgeon: Kristeen Miss, MD;  Location: [facility];  Service: Neurosurgery;  Laterality: N/A;   JOINT REPLACEMENT Right 2010   SHOULDER ARTHROSCOPY WITH ROTATOR CUFF REPAIR AND OPEN BICEPS TENODESIS Right 11/09/2019   Procedure: RIGHT SHOULDER ARTHROSCOPY WITH SUBSCAPULARIS REPAIR, SUBACROMIAL DECOMPRESSION,MINI OPEN ROTATOR CUFF REPAIR;  Surgeon: Leim Fabry, MD;  Location: [facility];  Service: Orthopedics;  Laterality: Right;   Patient Active Problem List   Diagnosis Date Noted   Long term current use of anticoagulant 04/04/2022   Rotator cuff syndrome 04/04/2022   Exposure to potentially hazardous substance 04/04/2022   Gout 04/04/2022   Osteoarthritis 04/04/2022   Osteoporosis 04/04/2022   Other specified diseases of hair and hair follicles 54/65/6812   Pain in joint, lower leg 04/04/2022   Problem related to  unspecified psychosocial circumstances 04/04/2022   General medical examination for administrative purposes 04/04/2022   Stage 3b chronic kidney disease ([place])    Dysphagia, post-stroke    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 hemorrhage of brain ([place]) 02/26/2022   Cough 75/17/0017   Acute metabolic encephalopathy 49/44/9675   Obstructive hydrocephalus ([place]) 02/19/2022   Dyslipidemia 02/19/2022   Dysphagia 02/19/2022   AKI (acute kidney injury) ([place])    Hyperkalemia    Anemia    Pleural effusion on right    Respiratory failure requiring intubation (HCC)    Cerebral edema (HCC)    Chronic atrial fibrillation ([employer])    Controlled type 2 diabetes mellitus with hyperglycemia, without long-term current use of insulin (HCC)    ICH (intracerebral hemorrhage) ([place]) 02/10/2022   Intracranial hemorrhage (HCC)    Anticoagulated    Moderate tricuspid regurgitation 07/26/2021   Moderate aortic valve stenosis 03/08/2020   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[place]) 02/18/2020   Macrocytic anemia 01/13/2020   Spleen enlargement 01/13/2020   Bilateral carotid artery stenosis 07/14/2018   Atrial fibrillation, chronic ([place]) 07/14/2018   Type 2 diabetes with nephropathy ([place]) 02/14/2016   Essential hypertension 08/16/2014   Hyperlipemia, mixed 08/16/2014   Renal insufficiency 08/16/2014    PERTINENT HISTORY:  March 2023 experienced hemorrhage in cerebellar peduncle and underwent craniotomy for evacuation. Notes his LUE and LLE are most affected at present. Underwent acute hospitalization, and [facility]. Recently D/C from home health on 04/30/22. Did not receive HHST, only HHPT/OT.     ONSET DATE: 02/10/22   REFERRING DIAG: I62.9 (ICD-10-CM) - Nontraumatic intracranial hemorrhage, unspecified   THERAPY DIAG:  Cognitive communication deficit  Dysarthria and anarthria  Rationale for Evaluation and Treatment Rehabilitation  SUBJECTIVE: "It's different voice exercises." (Pt, describing homework)  PAIN:  Are you  having pain? No NPRS scale: 6/38, with certain movement Pain location: back Pain orientation: Lower  PAIN TYPE: sharp Pain description: intermittent  Aggravating factors: certain movement Relieving factors: massage, rest   OBJECTIVE:   TODAY'S TREATMENT:  05/17/22: Vernon [name] checked in at 1150. Prior to session with pt and Vernon [name] alone in lobby Vernon [name] told SLP, "Vernon [name] can sing this week, it's better than last week!" SLP notes some spontaneous WNL voicing today with incr'd diaphragmatic effort. SLP highlighted this to pt and had pt read 5-7 word sentences and WNL quality heard 35% of the time. SLP had pt "do your homework like you've been doing it at home." Pt began with strained loud "ha" x3 reps and SLP stopped pt. SLP explained HEP is a progression of exercises and told pt to start with the first exercise - SLP req'd to cue pt where first exercise was. Semi-occluded vocal tract (SOVTE) exercises completed with SLP assistance necessary for attention skills. SLP used visual cues by highlighting the SOVTE SLP wants pt to focus on until next session. Using SOVTE prior to loud "ha" pt had much more WNL voicing without vocal strain. Mid-upper 80s dB for "ha" with WNL voicing for first 4 reps and notable fatigue on 2 subsequent reps.  No dysnomia noted today in session. Pt told SLP he had nephrologist appt at 3pm today, and that SLP was only session today however could not locate correct day on calendar without extra time and min A from SLP.   05/10/22: Vernon [name] reports he is using a calendar in the kitchen to keep track of his appointments and schedules. He feels he is asking Vernon [name] less about the day and is able recall using the calendar. Vernon [name] not present to confirm. He recalled his family is coming tomorrow to celebrate his birthday, however he required cues to check his calendar in his folder, as he stated he was done after ST, but looking at calendar, he ID'd that he had OT after ST. Voice remains harsh and  hoarse, with frequent throat clears. Trained Vernon [name] on throat clear alternatives, which he demonstrated with mod verbal cues and modeling. He required cues throughout session to ID when he cleared his throat. Initiated training in semi occluded vocal tract exercises (SOVTE) to reduce laryngeal tension with occasional min verbal cues and modeling, cues to reduce shoulder tension. Loud HHAA with easy onset completed with clear phonation 10/10x, then 5/5x at end of session with rare min A. Targeted phrases with flow phonation sounds to reduce harsh voice with usual min to mod verbal cues and modeling. Added SOVTE and moderately loud HA to HEP. Instructed his spouse, after session to let Vernon [name] know when he clears his throat. Throat clearing 7x this session, habitual prior to talking or some exercises  05/03/22: Introduced goals, trialed HEP for voice, however did not assign due to harsh onset and strain with exuberant voice therapy, with cues I modified to WNL onset, however not consistently enough to assign HEP for home.     PATIENT EDUCATION: Education details: See "treatment" above Person educated: Patient  Education method: Explanation, Demonstration, Verbal  cues, Handouts, and   Education comprehension: verbalized understanding, returned demonstration, verbal cues required, and needs further education         GOALS: Goals reviewed with patient? Yes   SHORT TERM GOALS: Target date: 05/31/22 Pt will complete HEP for voice/dysarthria with occasional min A over 2 sessoins Baseline: Goal status: Ongoing   2.  Pt will use external aids to recall daily events/schedule with occasional min A from spouse over 1 week Baseline:  Goal status: Ongoing   3.  Pt will achieve clear phonation 18/20 sentences in structured task Baseline:  Goal status: Ongoing   4.  Pt will ID dysnomic errors and self correct with rare min A over 2 sessions Baseline:  Goal status: Ongoing     LONG TERM GOALS: Target date:  07/26/22 Pt will achieve clear phonation over 12 minute conversation with rare min A over 2 sessions Baseline:  Goal status:Ongoing   2.  Pt will independently recall weekly/daily schedule, appointments events and pertinent information with memory aids over 2 sessions Baseline:  Goal status: Ongoing   3.  Pt will complete vocal warm ups and follow 3 vocal hygiene strategies to return to singing for 5-10 minute periods with rare min A Baseline:  Goal status: Ongoing   4.  Improve score on Cognitive function PROM by 4 points Baseline: 101- filled out by spouse Goal status: IOngoing   ASSESSMENT:   CLINICAL IMPRESSION: Patient is a 76 y.o. male who is seen for dysarthria/voice and cognition s/p CVA. SLP assisted pt today in honing his HEP for SOVTE and for stronger voice production using visual cues for attention/organization deficits. Vernon [name] reports Vernon [name] is "slow to organize his day" with him repeatedly asking her for the day's schedule and plan. Today he cont to present with mild cognitive impairments in memory, processing and high level attention. 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 Wife reports singing has improved slightly since last ST session. I recommend skilled ST to maximize voice and cognition for safety, to reduce caregiver burden and QOL.    OBJECTIVE IMPAIRMENTS include attention, memory, awareness, dysarthria, and voice disorder. These impairments are limiting patient from managing appointments, household responsibilities, and effectively communicating at home and in community. Factors affecting potential to achieve goals and functional outcome are ability to learn/carryover information.. Patient will benefit from skilled SLP services to address above impairments and improve overall function.   REHAB POTENTIAL: Good   PLAN: SLP FREQUENCY: 2x/week   SLP DURATION: 12 weeks   PLANNED INTERVENTIONS: Language facilitation, Environmental controls, Cueing  hierachy, Cognitive reorganization, Internal/external aids, Functional tasks, Multimodal communication approach, and SLP instruction and feedback         Vernon [name], [place] 05/17/2022, 12:59 PM   Pt notes from 05-10-22 ST session with SLP - [place] (below)  Eliminate throat clearing - use a liquid sip with a hard swallow, a forceful blow with a hard swallow or dry hard swallow   Clearing your throat irritates and damages your vocal folds, causes more mucus and  contributes to your hoarseness     Semi-occluded vocal tract exercises (SOVTE)   These allow your vocal folds to vibrate without excess tension and promotes high placement of the voice   Use SOVTE as a warm up before prolonged speaking and vocal exercises     Hum in the straw for 10 breaths  NO TENSION!   Pitch Glides x10  with kleenex   ("Waterfall")   Accents (siren)  (lowest to highest pitch)   Hum the [employer] through straw   A goal would be 2-3 minutes several times a day and prior to vocal exercises   As always, use good belly breathing while completing SOVTE   Loud HAAAA with gentle onset - not harsh        We  use flow phonation to improve proper air flow during speech. Sounds  that promote air flow include: s, z, sh, th, f, v, ch. We call this "flow speech"   After you complete the semi-occluded vocal tract exercises (straw exercises & gargling), complete the following twice a day   Use a tissue to focus on airflow:   Whooo 10x Shoe 10x Collie Siad 10x     Who are you?   Who is Collie Siad?     Twenty Thirty  Forty Fifty Sixty Seventy Eighty Ninety Hundred   She sells sea shells   See Sue's shoes   Fifty-Fifty   Hit the hammer   High school hero   Hocus Pocus   To each his own   Zebra's zig zag at the [place] chews [place] moms choose [place] prefers french fries   Vince vowed to vote   Feel  the furry fish   She should polish her shoes   Show them the fresh fruit   Teachers eat ripe peaches at [employer]   Not now nor never   My mama makes me muffins   No one knows Norman's nickname   See [place] Sleep soundly by the sea              Electronically signed by Cliffton Asters, CCC-SLP at 05/10/2022 10:34 AM  Electronically signed by Cliffton Asters, CCC-SLP at 05/10/2022 11:01 AM

## 2022-05-17 ENCOUNTER — Ambulatory Visit: Payer: Medicare Other

## 2022-05-17 DIAGNOSIS — R471 Dysarthria and anarthria: Secondary | ICD-10-CM

## 2022-05-17 DIAGNOSIS — R2681 Unsteadiness on feet: Secondary | ICD-10-CM | POA: Diagnosis not present

## 2022-05-17 DIAGNOSIS — R41841 Cognitive communication deficit: Secondary | ICD-10-CM

## 2022-05-18 ENCOUNTER — Ambulatory Visit: Payer: Medicare Other | Admitting: Occupational Therapy

## 2022-05-18 ENCOUNTER — Ambulatory Visit: Payer: Medicare Other

## 2022-05-21 ENCOUNTER — Ambulatory Visit: Payer: Medicare Other | Admitting: Physical Therapy

## 2022-05-21 ENCOUNTER — Encounter: Payer: Self-pay | Admitting: Physical Therapy

## 2022-05-21 ENCOUNTER — Ambulatory Visit: Payer: Medicare Other | Admitting: Occupational Therapy

## 2022-05-21 DIAGNOSIS — I69254 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: Secondary | ICD-10-CM

## 2022-05-21 DIAGNOSIS — R278 Other lack of coordination: Secondary | ICD-10-CM

## 2022-05-21 DIAGNOSIS — R2681 Unsteadiness on feet: Secondary | ICD-10-CM

## 2022-05-21 DIAGNOSIS — M6281 Muscle weakness (generalized): Secondary | ICD-10-CM

## 2022-05-22 ENCOUNTER — Inpatient Hospital Stay: Payer: Medicare Other | Admitting: Neurology

## 2022-05-22 ENCOUNTER — Encounter: Payer: Self-pay | Admitting: Internal Medicine

## 2022-05-22 ENCOUNTER — Telehealth: Payer: Self-pay | Admitting: Cardiology

## 2022-05-22 NOTE — Telephone Encounter (Signed)
    Patient has been scheduled for new patient consult with Dr. Lalla Brothers 07/20/22 for [facility] evaluation. Patient entered in all appropriate referral queues.   Georgie Chard NP-C Structural Heart Team  Pager: [phone] Phone: [phone]

## 2022-05-23 ENCOUNTER — Other Ambulatory Visit: Payer: Self-pay | Admitting: Nephrology

## 2022-05-23 DIAGNOSIS — R809 Proteinuria, unspecified: Secondary | ICD-10-CM

## 2022-05-23 DIAGNOSIS — N281 Cyst of kidney, acquired: Secondary | ICD-10-CM | POA: Insufficient documentation

## 2022-05-23 DIAGNOSIS — I10 Essential (primary) hypertension: Secondary | ICD-10-CM

## 2022-05-23 DIAGNOSIS — E1122 Type 2 diabetes mellitus with diabetic chronic kidney disease: Secondary | ICD-10-CM

## 2022-05-24 ENCOUNTER — Ambulatory Visit: Payer: Medicare Other

## 2022-05-24 ENCOUNTER — Ambulatory Visit: Payer: Medicare Other | Admitting: Occupational Therapy

## 2022-05-24 DIAGNOSIS — M6281 Muscle weakness (generalized): Secondary | ICD-10-CM

## 2022-05-24 DIAGNOSIS — R2689 Other abnormalities of gait and mobility: Secondary | ICD-10-CM

## 2022-05-24 DIAGNOSIS — R41841 Cognitive communication deficit: Secondary | ICD-10-CM

## 2022-05-24 DIAGNOSIS — I69254 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: Secondary | ICD-10-CM

## 2022-05-24 DIAGNOSIS — R471 Dysarthria and anarthria: Secondary | ICD-10-CM

## 2022-05-24 DIAGNOSIS — R262 Difficulty in walking, not elsewhere classified: Secondary | ICD-10-CM

## 2022-05-24 DIAGNOSIS — R2681 Unsteadiness on feet: Secondary | ICD-10-CM | POA: Diagnosis not present

## 2022-05-24 DIAGNOSIS — R278 Other lack of coordination: Secondary | ICD-10-CM

## 2022-05-24 NOTE — Therapy (Signed)
OUTPATIENT PHYSICAL THERAPY TREATMENT NOTE   Patient Name: Vernon [name] MRN: [id number] DOB:[birth year], 76 y.o., male Today's Date: 05/24/2022  PCP: Baxter Hire, MD  REFERRING PROVIDER: Baxter Hire, MD   END OF SESSION:   PT End of Session - 05/24/22 1516     Visit Number 5    Number of Visits 16    Date for PT Re-Evaluation 07/03/22    Authorization Type [employer] Medicare    PT Start Time 3875    PT Stop Time 1615    PT Time Calculation (min) 45 min    Equipment Utilized During Treatment Gait belt    Activity Tolerance Patient tolerated treatment well    Behavior During Therapy WFL for tasks assessed/performed              Past Medical History:  Diagnosis Date   Anemia    Aortic stenosis    Arthritis    Complication of anesthesia    hard time getting bp up after knee replacement   Diabetes mellitus without complication (HCC)    GERD (gastroesophageal reflux disease)    occ tums prn   History of hiatal hernia    Hypertension    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[place])    Past Surgical History:  Procedure Laterality Date   CARPAL TUNNEL RELEASE  2012   CRANIOTOMY N/A 02/10/2022   Procedure: SUBOCCIPITAL CRANIECTOMY FOR EVACUATION OF CEREBELLAR HEMATOMA;  Surgeon: Kristeen Miss, MD;  Location: [facility];  Service: Neurosurgery;  Laterality: N/A;   JOINT REPLACEMENT Right 2010   SHOULDER ARTHROSCOPY WITH ROTATOR CUFF REPAIR AND OPEN BICEPS TENODESIS Right 11/09/2019   Procedure: RIGHT SHOULDER ARTHROSCOPY WITH SUBSCAPULARIS REPAIR, SUBACROMIAL DECOMPRESSION,MINI OPEN ROTATOR CUFF REPAIR;  Surgeon: Leim Fabry, MD;  Location: [facility];  Service: Orthopedics;  Laterality: Right;   Patient Active Problem List   Diagnosis Date Noted   Long term current use of anticoagulant 04/04/2022   Rotator cuff syndrome 04/04/2022   Exposure to potentially hazardous substance 04/04/2022   Gout 04/04/2022   Osteoarthritis 04/04/2022   Osteoporosis 04/04/2022   Other specified  diseases of hair and hair follicles 64/33/2951   Pain in joint, lower leg 04/04/2022   Problem related to unspecified psychosocial circumstances 04/04/2022   General medical examination for administrative purposes 04/04/2022   Stage 3b chronic kidney disease ([place])    Dysphagia, post-stroke    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 hemorrhage of brain ([place]) 02/26/2022   Cough 88/41/6606   Acute metabolic encephalopathy 30/16/0109   Obstructive hydrocephalus ([place]) 02/19/2022   Dyslipidemia 02/19/2022   Dysphagia 02/19/2022   AKI (acute kidney injury) ([place])    Hyperkalemia    Anemia    Pleural effusion on right    Respiratory failure requiring intubation (HCC)    Cerebral edema (HCC)    Chronic atrial fibrillation ([place])    Controlled type 2 diabetes mellitus with hyperglycemia, without long-term current use of insulin (HCC)    ICH (intracerebral hemorrhage) ([place]) 02/10/2022   Intracranial hemorrhage (HCC)    Anticoagulated    Moderate tricuspid regurgitation 07/26/2021   Moderate aortic valve stenosis 03/08/2020   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[place]) 02/18/2020   Macrocytic anemia 01/13/2020   Spleen enlargement 01/13/2020   Bilateral carotid artery stenosis 07/14/2018   Atrial fibrillation, chronic ([place]) 07/14/2018   Type 2 diabetes with nephropathy ([place]) 02/14/2016   Essential hypertension 08/16/2014   Hyperlipemia, mixed 08/16/2014   Renal insufficiency 08/16/2014    REFERRING DIAG: I62.9 (ICD-10-CM) - Nontraumatic intracranial hemorrhage, unspecified  THERAPY DIAG:  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 3/10 in R shoulder and low back; aching pain in both places; continuous use makes it worse; doing nothing makes it feel better    OBJECTIVE:   TODAY'S TREATMENT: 05/24/22 Activity Comments  LTR x 2 min for mobility/warm-up. SKTC 3x15 sec. Active hamstring stretch 10x5 sec   SLR 2x10 3# Cues for position and stabilizing knee extension  Sidelying hip abd 3# 2x10   LAQ 3# 2x10   Alt. Stair taps 3# x 2 min   Foot on step 3x15 sec, no UE support LOB with head movements  Sidestepping 3# x 2 min Cues for foot position        DIAGNOSTIC FINDINGS: see MAR   COGNITION: Overall cognitive status: Within functional limits for tasks assessed             SENSATION: WFL   COORDINATION: Bradykinesia, difficulty wit rapid alternating movements     MUSCLE TONE: WFL       POSTURE: No Significant postural limitations   LOWER EXTREMITY ROM:      WFL  (Blank rows = not tested)   LOWER EXTREMITY MMT:     MMT Right Eval Left Eval  Hip flexion 5 4  Hip extension 5 3+  Hip abduction 5 3-  Hip adduction      Hip internal rotation      Hip external rotation      Knee flexion 5 4  Knee extension 5 4  Ankle dorsiflexion 5 4+  Ankle plantarflexion      Ankle inversion      Ankle eversion      (Blank rows = not tested)   BED MOBILITY:  Independent   TRANSFERS: Assistive device utilized: Environmental consultant - 2 wheeled  Sit to stand: Modified independence Stand to sit: Modified independence Chair to chair: SBA and CGA Floor:  DNT   RAMP:  Not available   CURB:  Level of Assistance: SBA and CGA Assistive device utilized: Environmental consultant - 2 wheeled Curb Comments: undershoots with LLE   STAIRS:           Level of Assistance: SBA and CGA           Stair Negotiation Technique: Alternating Pattern  with Bilateral Rails           Number of Stairs: 6             Height of Stairs: 4-6"            Comments: dysmetria w/ LLE    GAIT: Gait pattern: circumduction- Left and ataxic Distance walked: 100 Assistive device utilized: Walker - 2 wheeled Level of assistance: SBA Comments: dysmetria LLE   FUNCTIONAL TESTs:  Timed up and go (TUG): 20.25 with RW Berg Balance Scale: 35/56      M-CTSIB  Condition 1: Firm Surface, EO 30 Sec, Mild Sway  Condition 2: Firm Surface, EC 30 Sec, Mild and Moderate Sway  Condition 3: Foam Surface, EO 9 Sec, Severe Sway  Condition 4: Foam Surface, EC 0 Sec,  unable  Sway  PATIENT SURVEYS:  FOTO 54.43% functional status   TODAY'S TREATMENT:  Evaluation, sidestepping along counter, tandem stance at counter     PATIENT EDUCATION: Education details: scope of PT intervention Person educated: Patient and Spouse Education method: Explanation Education comprehension: verbalized understanding     HOME EXERCISE PROGRAM: sidestepping along counter, tandem stance at counter       GOALS: Goals reviewed with patient? Yes   SHORT TERM GOALS: Target date: 05/29/2022   Patient will perform HEP with family/caregiver supervision for improved strength, balance, transfers, and gait  Baseline: Goal status: INITIAL   2.  Patient will achieve 15 seconds for TUG test to manifest reduced risk for falls Baseline: 20.25 with RW Goal status: INITIAL   3. Demo modified independent gait level surfaces and curb negotiation            Baseline: CGA-SBA with RW            Goal status: INITIAL   LONG TERM GOALS: Target date: 06/26/2022   Patient will demonstrate score 45/56 Berg Balance Test to manifest low risk for falls Baseline: 35/56 Goal status: INITIAL   2.  Demonstrate improved static balance and postural control per time of 15 sec condition 4 M-CTSIB to improve safety with mobility on uneven surfaces Baseline: complete LOB left with condition 3 Goal status: INITIAL   3.  Demo modified independent gait using least restrictive AD over various surfaces and ascend/descend stairs  with set-up assist to improve functional mobility Baseline:  Goal status: INITIAL   4.  Demo score of 57% functional status FOTO  Baseline: 54.43% Goal status: INITIAL       ASSESSMENT:   CLINICAL IMPRESSION:   continues to demo decreased safety awareness and limited coordination/spatial awareness deficits with unsteadiness on feet and frequent LOB during single limb/narrow BOS activities, especially when coupled with head movements.  Session focus on facilitation of postural stability and isolated LE strengthening to improve kinesthetic awareness and enable enhanced coordination.  Continued sessions to progress mobility, balance, motor control/planning to reduce risk for falls during transfers/gait.    OBJECTIVE IMPAIRMENTS Abnormal gait, decreased activity tolerance, decreased balance, decreased coordination, decreased endurance, decreased knowledge of use of DME, difficulty walking, decreased strength, impaired perceived functional ability, and improper body mechanics.    ACTIVITY LIMITATIONS carrying, lifting, bending, standing, squatting, stairs, transfers, and locomotion level   PARTICIPATION LIMITATIONS: meal prep, cleaning, laundry, driving, shopping, community activity, and yard work   PERSONAL FACTORS Age, Fitness, and Time since onset of injury/illness/exacerbation are also affecting patient's functional outcome.    REHAB POTENTIAL: Excellent   CLINICAL DECISION MAKING: Evolving/moderate complexity   EVALUATION COMPLEXITY: Moderate   PLAN: PT FREQUENCY: 1-2x/week   PT DURATION: 9 weeks   PLANNED INTERVENTIONS: Therapeutic exercises, Therapeutic activity, Neuromuscular re-education, Balance training, Gait training, Patient/Family education, Joint mobilization, Stair training, Vestibular training, Canalith repositioning, DME instructions, Aquatic Therapy, Electrical stimulation, Wheelchair mobility training, and Manual therapy   PLAN FOR NEXT SESSION: Continue with  static balance in corner for safety; continue to review safety with functional transfers                  Kristen U PT DPT PN2  05/24/2022, 3:17 PM

## 2022-05-24 NOTE — Therapy (Signed)
OUTPATIENT OCCUPATIONAL THERAPY Treatment Note  Patient Name: Vernon [name] MRN: [id number] DOB:[birth year], 76 y.o., male Today's Date: 05/24/2022  PCP: Baxter Hire, MD REFERRING PROVIDER: Baxter Hire, MD   OT End of Session - 05/24/22 1444     Visit Number 5    Number of Visits 17    Date for OT Re-Evaluation 07/06/22    Authorization Type UHC Medicare    Authorization Time Period VL: [place]    OT Start Time 1451    OT Stop Time 1531    OT Time Calculation (min) 40 min    Activity Tolerance Patient tolerated treatment well    Behavior During Therapy WFL for tasks assessed/performed                Past Medical History:  Diagnosis Date   Anemia    Aortic stenosis    Arthritis    Complication of anesthesia    hard time getting bp up after knee replacement   Diabetes mellitus without complication (HCC)    GERD (gastroesophageal reflux disease)    occ tums prn   History of hiatal hernia    Hypertension    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[place])    Past Surgical History:  Procedure Laterality Date   CARPAL TUNNEL RELEASE  2012   CRANIOTOMY N/A 02/10/2022   Procedure: SUBOCCIPITAL CRANIECTOMY FOR EVACUATION OF CEREBELLAR HEMATOMA;  Surgeon: Kristeen Miss, MD;  Location: [facility];  Service: Neurosurgery;  Laterality: N/A;   JOINT REPLACEMENT Right 2010   SHOULDER ARTHROSCOPY WITH ROTATOR CUFF REPAIR AND OPEN BICEPS TENODESIS Right 11/09/2019   Procedure: RIGHT SHOULDER ARTHROSCOPY WITH SUBSCAPULARIS REPAIR, SUBACROMIAL DECOMPRESSION,MINI OPEN ROTATOR CUFF REPAIR;  Surgeon: Leim Fabry, MD;  Location: [facility];  Service: Orthopedics;  Laterality: Right;   Patient Active Problem List   Diagnosis Date Noted   Long term current use of anticoagulant 04/04/2022   Rotator cuff syndrome 04/04/2022   Exposure to potentially hazardous substance 04/04/2022   Gout 04/04/2022   Osteoarthritis 04/04/2022   Osteoporosis 04/04/2022   Other specified diseases of hair and hair  follicles 30/86/5784   Pain in joint, lower leg 04/04/2022   Problem related to unspecified psychosocial circumstances 04/04/2022   General medical examination for administrative purposes 04/04/2022   Stage 3b chronic kidney disease ([place])    Dysphagia, post-stroke    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 hemorrhage of brain ([place]) 02/26/2022   Cough 69/62/9528   Acute metabolic encephalopathy 41/32/4401   Obstructive hydrocephalus ([place]) 02/19/2022   Dyslipidemia 02/19/2022   Dysphagia 02/19/2022   AKI (acute kidney injury) ([place])    Hyperkalemia    Anemia    Pleural effusion on right    Respiratory failure requiring intubation (HCC)    Cerebral edema (HCC)    Chronic atrial fibrillation ([place])    Controlled type 2 diabetes mellitus with hyperglycemia, without long-term current use of insulin (HCC)    ICH (intracerebral hemorrhage) ([place]) 02/10/2022   Intracranial hemorrhage (HCC)    Anticoagulated    Moderate tricuspid regurgitation 07/26/2021   Moderate aortic valve stenosis 03/08/2020   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[place]) 02/18/2020   Macrocytic anemia 01/13/2020   Spleen enlargement 01/13/2020   Bilateral carotid artery stenosis 07/14/2018   Atrial fibrillation, chronic ([place]) 07/14/2018   Type 2 diabetes with nephropathy ([place]) 02/14/2016   Essential hypertension 08/16/2014   Hyperlipemia, mixed 08/16/2014   Renal insufficiency 08/16/2014    ONSET DATE: 02/10/2022  REFERRING DIAG: I62.9 (ICD-10-CM) - Nontraumatic intracranial hemorrhage, unspecified  THERAPY DIAG:  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 Ellene Route on 02/11/22 w/ EVD pulled 02/14/22. PMH includes DMT2, HTN, HLD, atrial fibrillation on Eliquis, arthritis, anemia, and GERD  PAIN: Are you having pain? Yes low back, R shoulder, and left knee pain. No pain if not moving. 0/10 right now sitting up in w/c. Rest helps decrease pain, certain movement and walking increases pain.  PATIENT GOALS: Be independent again; play guitar  OBJECTIVE:   FUNCTIONAL OUTCOME MEASURES: FOTO: 74   TODAY'S TREATMENT:  Coordination: Large pegs in resistive peg board with focus on fine and gross motor coordination, precision pinch, and manipulation of pegs.  Increased challenge to placing 2nd peg on top due to transition into open chain.  Then removing and placing in container with 1.5" square slit.  Pt encouraged to stabilize forearm on table and use vision to assist with hand positioning during in hand manipulation and placement. He was able to place pegs with increased time due to ataxia and difficulty with coordination.  Therapist again provided verbal cues for slowed pace and to visually attend to LUE when picking up and placing in desired location. Coordination: rotate large dice in hand. Rotating in hand to select given number to challenge in-hand manipulation.  Stacking 1" blocks, pt able to stack up to 4 with ataxia, improved control when stabilizing at forearm and then elbow as height of stack increased.   Functional reaching in standing with focus on reaching outside BOS with LUE to retreive resistive clothespins from vertical surface to challenge motor control and weight shifting.  Utilized 4# resistance clothespins with pt demonstrating good strength when squeezing clips, however continues to demonstrate significant ataxia when reaching outside body.  Therapist again provided verbal cues for slowed pace and to visually attend to LUE during reaching task.  CGA for standing balance, when reaching outside BOS.    PATIENT EDUCATION: Educated  on coordination tasks and visual attention to task to increase coordination. Person educated: Patient Education method: Explanation Education comprehension: verbalized understanding   HOME EXERCISE PROGRAM: Coordination handout - see pt instructions   GOALS: Goals reviewed with patient? Yes  SHORT TERM GOALS: Target date: 06/08/22  STG  Status:  1 Pt will demonstrate independence w/ initial HEP designed for LUE GM and [place] Baseline: No HEP at this time Progressing  2 Pt will improve participation in functional bilateral FM tasks as evidenced by decreasing time to complete 9-Hole Peg Test w/ LUE by at least 6 sec Baseline: Right: 21.8  sec; Left: 51.2 sec Progressing  3 Pt will be able to complete simulated bilateral functional task (e.g. cutting food, manipulating clothing fasteners, washing dishes) within reasonable amount of time Baseline: Difficulty w/ bilateral tasks Progressing     LONG TERM GOALS: Target date: 07/06/22  LTG  Status:  1 Pt will improve participation in functional activities as evidenced by increasing FOTO score to 71 by d/c Baseline: 62 Progressing  2 Pt will demonstrate improved control and accuracy w/ LUE by completing 8 taps within 10 seconds during finger to nose test Baseline: Right: 12 taps in 10 sec; Left: 6 taps in 10 sec Progressing  3 Pt will improve Box and Blocks  score to at least 40 blocks by d/c to indicate improved unilateral GMC of LUE Baseline: Right 61 blocks, Left 29 blocks Progressing  4 Pt will safely demonstrate simulated grilling activity, incorporating compensatory strategies/AE prn, w/ Mod I by d/c Baseline: Unable to complete at this time Progressing     ASSESSMENT:  CLINICAL IMPRESSION: Treatment session with focus on fine and gross motor coordination, with reaching and stacking in sitting and standing position.  Pt continues to demonstrate ataxia with gross and fine motor tasks, benefiting from verbal cues to slow down and  visually attend to tasks to increase motor control as well as utilizing stabilization through LUE elbow or forearm when applicable.  Pt continues to report frustration with ataxia with gross motor movements > fine motor movements - expressing desire to return to playing guitar.  PERFORMANCE DEFICITS in functional skills including ADLs, IADLs, coordination, dexterity, sensation, strength, FMC, GMC, mobility, balance, body mechanics, and UE functional use.  IMPAIRMENTS are limiting patient from ADLs, IADLs, and leisure.   COMORBIDITIES has co-morbidities such as atrial fibrillation on Eliquis and DMT2  that affects occupational performance. Patient will benefit from skilled OT to address above impairments and improve overall function.  MODIFICATION OR ASSISTANCE TO COMPLETE EVALUATION: Min-Moderate modification of tasks or assist with assess necessary to complete an evaluation.  OT OCCUPATIONAL PROFILE AND HISTORY: Detailed assessment: Review of records and additional review of physical, cognitive, psychosocial history related to current functional performance.  CLINICAL DECISION MAKING: Moderate - several treatment options, min-mod task modification necessary  REHAB POTENTIAL: Good  EVALUATION COMPLEXITY: Moderate   PLAN: OT FREQUENCY: 2x/week  OT DURATION: 8 weeks  PLANNED INTERVENTIONS: self care/ADL training, therapeutic exercise, therapeutic activity, neuromuscular re-education, manual therapy, functional mobility training, aquatic therapy, electrical stimulation, ultrasound, biofeedback, moist heat, cryotherapy, patient/family education, energy conservation, and DME and/or AE instructions  RECOMMENDED OTHER SERVICES: To receive PT/ST services at this location  CONSULTED AND AGREED WITH PLAN OF CARE: Patient and family member/caregiver  PLAN FOR NEXT SESSION:   GMC/FMC activities for LUE. Seated vs standing weight-bearing, closed-chain exercise, coordination activities, etc.   Functional in nature, if possible, to carry over to pt desire to play guitar again.   Simonne Come, OTR/L 05/24/2022, 2:54 PM

## 2022-05-24 NOTE — Therapy (Signed)
OUTPATIENT SPEECH LANGUAGE PATHOLOGY TREATMENT NOTE   Patient Name: Vernon [name] MRN: [id number] DOB:1946-03-13, 76 y.o., male Today's Date: 05/24/2022  PCP: Baxter Hire, MD  REFERRING PROVIDER: Baxter Hire, MD   END OF SESSION:   End of Session - 05/24/22 1620     Visit Number 4    Number of Visits 25    Date for SLP Re-Evaluation 07/26/22    Authorization Type UHC    SLP Start Time 1620    SLP Stop Time  1659    SLP Time Calculation (min) 39 min    Activity Tolerance Patient tolerated treatment well              Past Medical History:  Diagnosis Date   Anemia    Aortic stenosis    Arthritis    Complication of anesthesia    hard time getting bp up after knee replacement   Diabetes mellitus without complication (HCC)    GERD (gastroesophageal reflux disease)    occ tums prn   History of hiatal hernia    Hypertension    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[place])    Past Surgical History:  Procedure Laterality Date   CARPAL TUNNEL RELEASE  2012   CRANIOTOMY N/A 02/10/2022   Procedure: SUBOCCIPITAL CRANIECTOMY FOR EVACUATION OF CEREBELLAR HEMATOMA;  Surgeon: Kristeen Miss, MD;  Location: [facility];  Service: Neurosurgery;  Laterality: N/A;   JOINT REPLACEMENT Right 2010   SHOULDER ARTHROSCOPY WITH ROTATOR CUFF REPAIR AND OPEN BICEPS TENODESIS Right 11/09/2019   Procedure: RIGHT SHOULDER ARTHROSCOPY WITH SUBSCAPULARIS REPAIR, SUBACROMIAL DECOMPRESSION,MINI OPEN ROTATOR CUFF REPAIR;  Surgeon: Leim Fabry, MD;  Location: [facility];  Service: Orthopedics;  Laterality: Right;   Patient Active Problem List   Diagnosis Date Noted   Long term current use of anticoagulant 04/04/2022   Rotator cuff syndrome 04/04/2022   Exposure to potentially hazardous substance 04/04/2022   Gout 04/04/2022   Osteoarthritis 04/04/2022   Osteoporosis 04/04/2022   Other specified diseases of hair and hair follicles 89/38/1017   Pain in joint, lower leg 04/04/2022   Problem related to  unspecified psychosocial circumstances 04/04/2022   General medical examination for administrative purposes 04/04/2022   Stage 3b chronic kidney disease ([place])    Dysphagia, post-stroke    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 hemorrhage of brain ([place]) 02/26/2022   Cough 51/12/5850   Acute metabolic encephalopathy 77/82/4235   Obstructive hydrocephalus ([place]) 02/19/2022   Dyslipidemia 02/19/2022   Dysphagia 02/19/2022   AKI (acute kidney injury) ([place])    Hyperkalemia    Anemia    Pleural effusion on right    Respiratory failure requiring intubation (HCC)    Cerebral edema (HCC)    Chronic atrial fibrillation ([place])    Controlled type 2 diabetes mellitus with hyperglycemia, without long-term current use of insulin (HCC)    ICH (intracerebral hemorrhage) ([place]) 02/10/2022   Intracranial hemorrhage (HCC)    Anticoagulated    Moderate tricuspid regurgitation 07/26/2021   Moderate aortic valve stenosis 03/08/2020   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[place]) 02/18/2020   Macrocytic anemia 01/13/2020   Spleen enlargement 01/13/2020   Bilateral carotid artery stenosis 07/14/2018   Atrial fibrillation, chronic ([place]) 07/14/2018   Type 2 diabetes with nephropathy ([place]) 02/14/2016   Essential hypertension 08/16/2014   Hyperlipemia, mixed 08/16/2014   Renal insufficiency 08/16/2014    PERTINENT HISTORY:  March 2023 experienced hemorrhage in cerebellar peduncle and underwent craniotomy for evacuation. Notes his LUE and LLE are most affected at present. Underwent acute hospitalization, and [facility]. Recently D/C from home health on 04/30/22. Did not receive HHST, only HHPT/OT.     ONSET DATE: 02/10/22   REFERRING DIAG: I62.9 (ICD-10-CM) - Nontraumatic intracranial hemorrhage, unspecified   THERAPY DIAG:  Cognitive communication deficit  Dysarthria and anarthria  Rationale for Evaluation and Treatment Rehabilitation  SUBJECTIVE: "It's different voice exercises." (Pt, describing homework)  PAIN:  Are you  having pain? Yes NPRS scale: 9/14, with certain movement Pain location: rt shoulder Pain orientation: Lower  PAIN TYPE: ache Pain description: intermittent  Aggravating factors: certain movement Relieving factors: massage, rest   OBJECTIVE:   TODAY'S TREATMENT: 05/24/22: Semi-occluded vocal tract (SOVTE) exercises completed today with SLP min A due to pt strained voice. Good airflow with mostly all reps of exercises. Pt stated he has been practicing highlighted exercises BID. Loud "ha" with tremulous vocal quality, but mostly withotut strained voice (following SOVTE exercises). Mid-upper 80s dB average for "ha". In conversation pt used WNL voicing ~50% of the time.  Again, no dysnomic errors today.  6/22/23[name] checked in at 1150. Prior to session with pt and [name] alone in lobby [name] told SLP, "Vernon [name] can sing this week, it's better than last week!" SLP notes some spontaneous WNL voicing today with incr'd diaphragmatic effort. SLP highlighted this to pt and had pt read 5-7 word sentences and WNL quality heard 35% of the time. SLP had pt "do your homework like you've been doing it at home." Pt began with strained loud "ha" x3 reps and SLP stopped pt. SLP explained HEP is a progression of exercises and told pt to start with the first exercise - SLP req'd to cue pt where first exercise was. Semi-occluded vocal tract (SOVTE) exercises completed with SLP assistance necessary for attention skills. SLP used visual cues by highlighting the SOVTE SLP wants pt to focus on until next session. Using SOVTE prior to loud "ha" pt had much more WNL voicing without vocal strain. Mid-upper 80s dB for "ha" with WNL voicing for first 4 reps and notable fatigue on 2 subsequent reps.  No dysnomia noted today in session. Pt told SLP he had nephrologist appt at 3pm today, and that SLP was only session today however could not locate correct day on calendar without extra time and min A from SLP.   05/10/22: Vernon [name]  reports he is using a calendar in the kitchen to keep track of his appointments and schedules. He feels he is asking [name] less about the day and is able recall using the calendar. [name] not present to confirm. He recalled his family is coming tomorrow to celebrate his birthday, however he required cues to check his calendar in his folder, as he stated he was done after ST, but looking at calendar, he ID'd that he had OT after ST. Voice remains harsh and hoarse, with frequent throat clears. Trained Vernon [name] on throat clear alternatives, which he demonstrated with mod verbal cues and modeling. He required cues throughout session to ID when he cleared his throat. Initiated training in semi occluded vocal tract exercises (SOVTE) to reduce laryngeal tension with occasional min verbal cues and modeling, cues to reduce shoulder tension. Loud HHAA with easy onset completed with clear phonation 10/10x, then 5/5x at end of session with rare min A. Targeted phrases with flow phonation sounds to reduce harsh voice with usual min to mod verbal cues and modeling. Added SOVTE and moderately loud HA to HEP. Instructed his spouse, after session to let Shanard know when he clears his  throat. Throat clearing 7x this session, habitual prior to talking or some exercises  05/03/22: Introduced goals, trialed HEP for voice, however did not assign due to harsh onset and strain with exuberant voice therapy, with cues I modified to WNL onset, however not consistently enough to assign HEP for home.     PATIENT EDUCATION: Education details: See "treatment" above Person educated: Patient  Education method: Explanation, Demonstration, Verbal cues, Handouts, and   Education comprehension: verbalized understanding, returned demonstration, verbal cues required, and needs further education         GOALS: Goals reviewed with patient? Yes   SHORT TERM GOALS: Target date: 05/31/22 Pt will complete HEP for voice/dysarthria with occasional min A  over 2 sessions Baseline: 05/24/22 Goal status: Ongoing   2.  Pt will use external aids to recall daily events/schedule with occasional min A from spouse over 1 week Baseline:  Goal status: Ongoing   3.  Pt will achieve clear phonation 18/20 sentences in structured task Baseline:  Goal status: Ongoing   4.  Pt will ID dysnomic errors and self correct with rare min A over 2 sessions Baseline:  Goal status: Ongoing     LONG TERM GOALS: Target date: 07/26/22 Pt will achieve clear phonation over 12 minute conversation with rare min A over 2 sessions Baseline:  Goal status:Ongoing   2.  Pt will independently recall weekly/daily schedule, appointments events and pertinent information with memory aids over 2 sessions Baseline:  Goal status: Ongoing   3.  Pt will complete vocal warm ups and follow 3 vocal hygiene strategies to return to singing for 5-10 minute periods with rare min A Baseline:  Goal status: Ongoing   4.  Improve score on Cognitive function PROM by 4 points Baseline: 101- filled out by spouse Goal status: IOngoing   ASSESSMENT:   CLINICAL IMPRESSION: Patient is a 75 y.o. male who is seen for dysarthria/voice and cognition s/p CVA. SLP assisted pt today with improvied vocal quality with HEP for SOVTE. 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 I recommend cont'd skilled ST to maximize voice and cognition for safety, to reduce caregiver burden and QOL.    OBJECTIVE IMPAIRMENTS include attention, memory, awareness, dysarthria, and voice disorder. These impairments are limiting patient from managing appointments, household responsibilities, and effectively communicating at home and in community. Factors affecting potential to achieve goals and functional outcome are ability to learn/carryover information.. Patient will benefit from skilled SLP services to address above impairments and improve overall function.   REHAB POTENTIAL: Good   PLAN: SLP FREQUENCY:  2x/week   SLP DURATION: 12 weeks   PLANNED INTERVENTIONS: Language facilitation, Environmental controls, Cueing hierachy, Cognitive reorganization, Internal/external aids, Functional tasks, Multimodal communication approach, and SLP instruction and feedback         [place], [place] 05/24/2022, 5:26 PM   Pt notes from 05-10-22 ST session with SLP - [place] (below)  Eliminate throat clearing - use a liquid sip with a hard swallow, a forceful blow with a hard swallow or dry hard swallow   Clearing your throat irritates and damages your vocal folds, causes more mucus and  contributes to your hoarseness     Semi-occluded vocal tract exercises (SOVTE)   These allow your vocal folds to vibrate without excess tension and promotes high placement of the voice   Use SOVTE as a warm up before prolonged speaking and vocal exercises     Hum in the straw for 10 breaths  NO TENSION!   Pitch Glides x10  with kleenex   ("Waterfall")   Accents (siren)  (lowest to highest pitch)   Hum the [employer] through straw   A goal would be 2-3 minutes several times a day and prior to vocal exercises   As always, use good belly breathing while completing SOVTE   Loud HAAAA with gentle onset - not harsh        We  use flow phonation to improve proper air flow during speech. Sounds  that promote air flow include: s, z, sh, th, f, v, ch. We call this "flow speech"   After you complete the semi-occluded vocal tract exercises (straw exercises & gargling), complete the following twice a day   Use a tissue to focus on airflow:   Whooo 10x Shoe 10x Collie Siad 10x     Who are you?   Who is Collie Siad?     Twenty Thirty  Forty Fifty Sixty Seventy Eighty Ninety Hundred   She sells sea shells   See Sue's shoes   Fifty-Fifty   Hit the hammer   High school hero   Hocus Pocus   To each his own   Zebra's zig zag at the [place]  chews [place] moms choose [place] prefers french fries   Vince vowed to vote   Feel the furry fish   She should polish her shoes   Show them the fresh fruit   Teachers eat ripe peaches at [employer]   Not now nor never   My mama makes me muffins   No one knows Norman's nickname   See Gay [place] Sleep soundly by the sea              Electronically signed by Cliffton Asters, CCC-SLP at 05/10/2022 10:34 AM  Electronically signed by Cliffton Asters, CCC-SLP at 05/10/2022 11:01 AM

## 2022-05-28 ENCOUNTER — Ambulatory Visit: Payer: Medicare Other | Attending: Internal Medicine

## 2022-05-28 ENCOUNTER — Ambulatory Visit: Payer: Medicare Other

## 2022-05-28 DIAGNOSIS — R29818 Other symptoms and signs involving the nervous system: Secondary | ICD-10-CM | POA: Insufficient documentation

## 2022-05-28 DIAGNOSIS — M6281 Muscle weakness (generalized): Secondary | ICD-10-CM | POA: Insufficient documentation

## 2022-05-28 DIAGNOSIS — R2689 Other abnormalities of gait and mobility: Secondary | ICD-10-CM | POA: Diagnosis present

## 2022-05-28 DIAGNOSIS — R2681 Unsteadiness on feet: Secondary | ICD-10-CM | POA: Insufficient documentation

## 2022-05-28 DIAGNOSIS — R278 Other lack of coordination: Secondary | ICD-10-CM | POA: Insufficient documentation

## 2022-05-28 DIAGNOSIS — R262 Difficulty in walking, not elsewhere classified: Secondary | ICD-10-CM | POA: Diagnosis present

## 2022-05-28 DIAGNOSIS — R471 Dysarthria and anarthria: Secondary | ICD-10-CM

## 2022-05-28 DIAGNOSIS — I69254 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: Secondary | ICD-10-CM | POA: Insufficient documentation

## 2022-05-28 DIAGNOSIS — R41841 Cognitive communication deficit: Secondary | ICD-10-CM | POA: Diagnosis present

## 2022-05-28 NOTE — Therapy (Signed)
OUTPATIENT PHYSICAL THERAPY TREATMENT NOTE   Patient Name: Vernon [name] MRN: [id number] DOB:[birth year], 76 y.o., male Today's Date: 05/28/2022  PCP: Baxter Hire, MD  REFERRING PROVIDER: Baxter Hire, MD   END OF SESSION:   PT End of Session - 05/28/22 1531     Visit Number 6    Number of Visits 16    Date for PT Re-Evaluation 07/03/22    Authorization Type UHC Medicare    PT Start Time 8250    PT Stop Time 1615    PT Time Calculation (min) 45 min    Equipment Utilized During Treatment Gait belt    Activity Tolerance Patient tolerated treatment well    Behavior During Therapy WFL for tasks assessed/performed              Past Medical History:  Diagnosis Date   Anemia    Aortic stenosis    Arthritis    Complication of anesthesia    hard time getting bp up after knee replacement   Diabetes mellitus without complication (HCC)    GERD (gastroesophageal reflux disease)    occ tums prn   History of hiatal hernia    Hypertension    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[place])    Past Surgical History:  Procedure Laterality Date   CARPAL TUNNEL RELEASE  2012   CRANIOTOMY N/A 02/10/2022   Procedure: SUBOCCIPITAL CRANIECTOMY FOR EVACUATION OF CEREBELLAR HEMATOMA;  Surgeon: Kristeen Miss, MD;  Location: [facility];  Service: Neurosurgery;  Laterality: N/A;   JOINT REPLACEMENT Right 2010   SHOULDER ARTHROSCOPY WITH ROTATOR CUFF REPAIR AND OPEN BICEPS TENODESIS Right 11/09/2019   Procedure: RIGHT SHOULDER ARTHROSCOPY WITH SUBSCAPULARIS REPAIR, SUBACROMIAL DECOMPRESSION,MINI OPEN ROTATOR CUFF REPAIR;  Surgeon: Leim Fabry, MD;  Location: [facility];  Service: Orthopedics;  Laterality: Right;   Patient Active Problem List   Diagnosis Date Noted   Long term current use of anticoagulant 04/04/2022   Rotator cuff syndrome 04/04/2022   Exposure to potentially hazardous substance 04/04/2022   Gout 04/04/2022   Osteoarthritis 04/04/2022   Osteoporosis 04/04/2022   Other specified  diseases of hair and hair follicles 53/97/6734   Pain in joint, lower leg 04/04/2022   Problem related to unspecified psychosocial circumstances 04/04/2022   General medical examination for administrative purposes 04/04/2022   Stage 3b chronic kidney disease ([place])    Dysphagia, post-stroke    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 hemorrhage of brain ([place]) 02/26/2022   Cough 19/37/9024   Acute metabolic encephalopathy 09/73/5329   Obstructive hydrocephalus ([place]) 02/19/2022   Dyslipidemia 02/19/2022   Dysphagia 02/19/2022   AKI (acute kidney injury) ([place])    Hyperkalemia    Anemia    Pleural effusion on right    Respiratory failure requiring intubation (HCC)    Cerebral edema (HCC)    Chronic atrial fibrillation ([place])    Controlled type 2 diabetes mellitus with hyperglycemia, without long-term current use of insulin (HCC)    ICH (intracerebral hemorrhage) ([place]) 02/10/2022   Intracranial hemorrhage (HCC)    Anticoagulated    Moderate tricuspid regurgitation 07/26/2021   Moderate aortic valve stenosis 03/08/2020   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[place]) 02/18/2020   Macrocytic anemia 01/13/2020   Spleen enlargement 01/13/2020   Bilateral carotid artery stenosis 07/14/2018   Atrial fibrillation, chronic ([place]) 07/14/2018   Type 2 diabetes with nephropathy ([place]) 02/14/2016   Essential hypertension 08/16/2014   Hyperlipemia, mixed 08/16/2014   Renal insufficiency 08/16/2014    REFERRING DIAG: I62.9 (ICD-10-CM) - Nontraumatic intracranial hemorrhage, unspecified  THERAPY DIAG:  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)  Muscle weakness (generalized)  Unsteadiness on feet  Other abnormalities of gait and mobility  Difficulty in walking, not elsewhere classified  Rationale for Evaluation and Treatment Rehabilitation  PERTINENT HISTORY: see MAR  PRECAUTIONS: fall risk  SUBJECTIVE: Back is feeling better but left knee is  hurting today  PAIN:  Left knee: 4-5/10 -- indicates joint line   OBJECTIVE:   TODAY'S TREATMENT: 05/28/22 Activity Comments  LAQ 5# 3x10   Sidestepping x 2 min, 5# ankle Along counter  High march along counter x 2 min 5# Onset of fatigue  Stair taps 5# x 2 min unilat UE support Visual target for left foot to prevent scissoring/crossing mid line  Standing on foam: EO/EC, head turns EO/EC 6x15 sec  Gait with RW and CGA with cues for LLE abduction/circumduction to reduce tendency for crossing midline        DIAGNOSTIC FINDINGS: see MAR   COGNITION: Overall cognitive status: Within functional limits for tasks assessed             SENSATION: WFL   COORDINATION: Bradykinesia, difficulty wit rapid alternating movements     MUSCLE TONE: WFL       POSTURE: No Significant postural limitations   LOWER EXTREMITY ROM:      WFL  (Blank rows = not tested)   LOWER EXTREMITY MMT:     MMT Right Eval Left Eval  Hip flexion 5 4  Hip extension 5 3+  Hip abduction 5 3-  Hip adduction      Hip internal rotation      Hip external rotation      Knee flexion 5 4  Knee extension 5 4  Ankle dorsiflexion 5 4+  Ankle plantarflexion      Ankle inversion      Ankle eversion      (Blank rows = not tested)   BED MOBILITY:  Independent   TRANSFERS: Assistive device utilized: Environmental consultant - 2 wheeled  Sit to stand: Modified independence Stand to sit: Modified independence Chair to chair: SBA and CGA Floor:  DNT   RAMP:  Not available   CURB:  Level of Assistance: SBA and CGA Assistive device utilized: Environmental consultant - 2 wheeled Curb Comments: undershoots with LLE   STAIRS:           Level of Assistance: SBA and CGA           Stair Negotiation Technique: Alternating Pattern  with Bilateral Rails           Number of Stairs: 6             Height of Stairs: 4-6"            Comments: dysmetria w/ LLE   GAIT: Gait pattern: circumduction- Left and ataxic Distance walked: 100 Assistive  device utilized: Walker - 2 wheeled Level of assistance: SBA Comments: dysmetria LLE   FUNCTIONAL TESTs:  Timed up and go (TUG): 20.25 with RW Berg Balance Scale: 35/56      M-CTSIB  Condition 1: Firm Surface, EO 30 Sec, Mild Sway  Condition 2: Firm Surface, EC 30 Sec, Mild and Moderate Sway  Condition 3: Foam Surface, EO 9 Sec, Severe Sway  Condition 4: Foam Surface, EC 0 Sec,  unable  Sway    PATIENT SURVEYS:  FOTO 54.43% functional status   TODAY'S TREATMENT:  Evaluation, sidestepping along counter, tandem stance at counter  PATIENT EDUCATION: Education details: scope of PT intervention Person educated: Patient and Spouse Education method: Explanation Education comprehension: verbalized understanding     HOME EXERCISE PROGRAM: sidestepping along counter, tandem stance at counter       GOALS: Goals reviewed with patient? Yes   SHORT TERM GOALS: Target date: 05/29/2022   Patient will perform HEP with family/caregiver supervision for improved strength, balance, transfers, and gait  Baseline: Goal status: INITIAL   2.  Patient will achieve 15 seconds for TUG test to manifest reduced risk for falls Baseline: 20.25 with RW Goal status: INITIAL   3. Demo modified independent gait level surfaces and curb negotiation            Baseline: CGA-SBA with RW            Goal status: INITIAL   LONG TERM GOALS: Target date: 06/26/2022   Patient will demonstrate score 45/56 Berg Balance Test to manifest low risk for falls Baseline: 35/56 Goal status: INITIAL   2.  Demonstrate improved static balance and postural control per time of 15 sec condition 4 M-CTSIB to improve safety with mobility on uneven surfaces Baseline: complete LOB left with condition 3 Goal status: INITIAL   3.  Demo modified independent gait using least restrictive AD over various surfaces and ascend/descend stairs with set-up assist to improve functional mobility Baseline:  Goal status: INITIAL   4.   Demo score of 57% functional status FOTO  Baseline: 54.43% Goal status: INITIAL       ASSESSMENT:   CLINICAL IMPRESSION: Treatment focus on improving selective control LLE and accuracy of movement for swing phase to reduce tendency for crossing midline via ankle weights, visual/verbal cues, and large abduction movements to provide compensatory strategy.  Progressing well with strength and motor control as evident by use of 5 lbs ankle weight and performing visual accuracy movements with good performance using visual target.  Poor proprioceptive control when standing on compliant surface and performing head movements with tendency for retro/right LOB. Continued sessions to progress motor control/coordination, strength, and dynamic balance to reduce risk for falls.    OBJECTIVE IMPAIRMENTS Abnormal gait, decreased activity tolerance, decreased balance, decreased coordination, decreased endurance, decreased knowledge of use of DME, difficulty walking, decreased strength, impaired perceived functional ability, and improper body mechanics.    ACTIVITY LIMITATIONS carrying, lifting, bending, standing, squatting, stairs, transfers, and locomotion level   PARTICIPATION LIMITATIONS: meal prep, cleaning, laundry, driving, shopping, community activity, and yard work   PERSONAL FACTORS Age, Fitness, and Time since onset of injury/illness/exacerbation are also affecting patient's functional outcome.    REHAB POTENTIAL: Excellent   CLINICAL DECISION MAKING: Evolving/moderate complexity   EVALUATION COMPLEXITY: Moderate   PLAN: PT FREQUENCY: 1-2x/week   PT DURATION: 9 weeks   PLANNED INTERVENTIONS: Therapeutic exercises, Therapeutic activity, Neuromuscular re-education, Balance training, Gait training, Patient/Family education, Joint mobilization, Stair training, Vestibular training, Canalith repositioning, DME instructions, Aquatic Therapy, Electrical stimulation, Wheelchair mobility training, and  Manual therapy   PLAN FOR NEXT SESSION: Continue with static balance in corner for safety; continue to review safety with functional transfers                  Kristen U PT DPT PN2  05/28/2022, 3:32 PM

## 2022-05-28 NOTE — Therapy (Signed)
OUTPATIENT SPEECH LANGUAGE PATHOLOGY TREATMENT NOTE   Patient Name: Vernon [name] MRN: [id number] DOB:Aug 12, 1946, 76 y.o., male Today's Date: 05/28/2022  PCP: Baxter Hire, MD  REFERRING PROVIDER: Baxter Hire, MD   END OF SESSION:   End of Session - 05/28/22 1633     Visit Number 5    Number of Visits 25    Date for SLP Re-Evaluation 07/26/22    Authorization Type UHC    SLP Start Time 1621    SLP Stop Time  1700    SLP Time Calculation (min) 39 min    Activity Tolerance Patient tolerated treatment well               Past Medical History:  Diagnosis Date   Anemia    Aortic stenosis    Arthritis    Complication of anesthesia    hard time getting bp up after knee replacement   Diabetes mellitus without complication (HCC)    GERD (gastroesophageal reflux disease)    occ tums prn   History of hiatal hernia    Hypertension    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[place])    Past Surgical History:  Procedure Laterality Date   CARPAL TUNNEL RELEASE  2012   CRANIOTOMY N/A 02/10/2022   Procedure: SUBOCCIPITAL CRANIECTOMY FOR EVACUATION OF CEREBELLAR HEMATOMA;  Surgeon: Kristeen Miss, MD;  Location: [place];  Service: Neurosurgery;  Laterality: N/A;   JOINT REPLACEMENT Right 2010   SHOULDER ARTHROSCOPY WITH ROTATOR CUFF REPAIR AND OPEN BICEPS TENODESIS Right 11/09/2019   Procedure: RIGHT SHOULDER ARTHROSCOPY WITH SUBSCAPULARIS REPAIR, SUBACROMIAL DECOMPRESSION,MINI OPEN ROTATOR CUFF REPAIR;  Surgeon: Leim Fabry, MD;  Location: [facility];  Service: Orthopedics;  Laterality: Right;   Patient Active Problem List   Diagnosis Date Noted   Long term current use of anticoagulant 04/04/2022   Rotator cuff syndrome 04/04/2022   Exposure to potentially hazardous substance 04/04/2022   Gout 04/04/2022   Osteoarthritis 04/04/2022   Osteoporosis 04/04/2022   Other specified diseases of hair and hair follicles 62/83/1517   Pain in joint, lower leg 04/04/2022   Problem related to  unspecified psychosocial circumstances 04/04/2022   General medical examination for administrative purposes 04/04/2022   Stage 3b chronic kidney disease ([place])    Dysphagia, post-stroke    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 hemorrhage of brain ([place]) 02/26/2022   Cough 61/60/7371   Acute metabolic encephalopathy 05/21/9484   Obstructive hydrocephalus ([place]) 02/19/2022   Dyslipidemia 02/19/2022   Dysphagia 02/19/2022   AKI (acute kidney injury) ([place])    Hyperkalemia    Anemia    Pleural effusion on right    Respiratory failure requiring intubation (HCC)    Cerebral edema (HCC)    Chronic atrial fibrillation ([place])    Controlled type 2 diabetes mellitus with hyperglycemia, without long-term current use of insulin (HCC)    ICH (intracerebral hemorrhage) ([place]) 02/10/2022   Intracranial hemorrhage (HCC)    Anticoagulated    Moderate tricuspid regurgitation 07/26/2021   Moderate aortic valve stenosis 03/08/2020   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[place]) 02/18/2020   Macrocytic anemia 01/13/2020   Spleen enlargement 01/13/2020   Bilateral carotid artery stenosis 07/14/2018   Atrial fibrillation, chronic ([place]) 07/14/2018   Type 2 diabetes with nephropathy ([place]) 02/14/2016   Essential hypertension 08/16/2014   Hyperlipemia, mixed 08/16/2014   Renal insufficiency 08/16/2014    PERTINENT HISTORY:  March 2023 experienced hemorrhage in cerebellar peduncle and underwent craniotomy for evacuation. Notes his LUE and LLE are most affected at present. Underwent acute hospitalization, and  [facility]. Recently D/C from home health on 04/30/22. Did not receive HHST, only HHPT/OT.     ONSET DATE: 02/10/22   REFERRING DIAG: I62.9 (ICD-10-CM) - Nontraumatic intracranial hemorrhage, unspecified   THERAPY DIAG:  Cognitive communication deficit  Dysarthria and anarthria  Rationale for Evaluation and Treatment Rehabilitation  SUBJECTIVE: "Sorry she forgot the folder." "I really think that my memory is what it was  before the CVA."  PAIN:  Are you having pain? Yes NPRS scale: 2/72, with certain movement Pain location: rt shoulder Pain orientation: Lower  PAIN TYPE: ache Pain description: intermittent  Aggravating factors: certain movement Relieving factors: massage, rest   OBJECTIVE:   TODAY'S TREATMENT: 05/28/22: Pt provided very detailed explanation regarding of his nephrology appointment last week, and about his plans for tomorrow with family. He stated next session he will bring his folder, instead of relying on [name]. SLP worked with pt on his voice exercises, requiring min A occasionally for strong voice without squeezing, and min A usually for abdominal breathing. Pt's loud /a/ was produced with WNL voicing 50%.   05/24/22: Semi-occluded vocal tract (SOVTE) exercises completed today with SLP min A due to pt strained voice. Good airflow with mostly all reps of exercises. Pt stated he has been practicing highlighted exercises BID. Loud "ha" with tremulous vocal quality, but mostly withotut strained voice (following SOVTE exercises). Mid-upper 80s dB average for "ha". In conversation pt used WNL voicing ~50% of the time.  Again, no dysnomic errors today.  6/22/23[name] checked in at 1150. Prior to session with pt and [name] alone in lobby [name] told SLP, "Sachin can sing this week, it's better than last week!" SLP notes some spontaneous WNL voicing today with incr'd diaphragmatic effort. SLP highlighted this to pt and had pt read 5-7 word sentences and WNL quality heard 35% of the time. SLP had pt "do your homework like you've been doing it at home." Pt began with strained loud "ha" x3 reps and SLP stopped pt. SLP explained HEP is a progression of exercises and told pt to start with the first exercise - SLP req'd to cue pt where first exercise was. Semi-occluded vocal tract (SOVTE) exercises completed with SLP assistance necessary for attention skills. SLP used visual cues by highlighting the SOVTE SLP  wants pt to focus on until next session. Using SOVTE prior to loud "ha" pt had much more WNL voicing without vocal strain. Mid-upper 80s dB for "ha" with WNL voicing for first 4 reps and notable fatigue on 2 subsequent reps.  No dysnomia noted today in session. Pt told SLP he had nephrologist appt at 3pm today, and that SLP was only session today however could not locate correct day on calendar without extra time and min A from SLP.   05/10/22: Gevon reports he is using a calendar in the kitchen to keep track of his appointments and schedules. He feels he is asking [name] less about the day and is able recall using the calendar. [name] not present to confirm. He recalled his family is coming tomorrow to celebrate his birthday, however he required cues to check his calendar in his folder, as he stated he was done after ST, but looking at calendar, he ID'd that he had OT after ST. Voice remains harsh and hoarse, with frequent throat clears. Trained [name] on throat clear alternatives, which he demonstrated with mod verbal cues and modeling. He required cues throughout session to ID when he cleared his throat. Initiated training in  semi occluded vocal tract exercises (SOVTE) to reduce laryngeal tension with occasional min verbal cues and modeling, cues to reduce shoulder tension. Loud HHAA with easy onset completed with clear phonation 10/10x, then 5/5x at end of session with rare min A. Targeted phrases with flow phonation sounds to reduce harsh voice with usual min to mod verbal cues and modeling. Added SOVTE and moderately loud HA to HEP. Instructed his spouse, after session to let Silvano know when he clears his throat. Throat clearing 7x this session, habitual prior to talking or some exercises  05/03/22: Introduced goals, trialed HEP for voice, however did not assign due to harsh onset and strain with exuberant voice therapy, with cues I modified to WNL onset, however not consistently enough to assign HEP for  home.     PATIENT EDUCATION: Education details: See "treatment" above Person educated: Patient  Education method: Explanation, Demonstration, Verbal cues, Handouts, and   Education comprehension: verbalized understanding, returned demonstration, verbal cues required, and needs further education         GOALS: Goals reviewed with patient? Yes   SHORT TERM GOALS: Target date: 05/31/22 Pt will complete HEP for voice/dysarthria with occasional min A over 2 sessions Baseline: 05/24/22 Goal status: Ongoing   2.  Pt will use external aids to recall daily events/schedule with occasional min A from spouse over 1 week Baseline:  Goal status: Deferred   3.  Pt will achieve clear phonation 18/20 sentences in structured task Baseline:  Goal status: Ongoing   4.  Pt will ID dysnomic errors and self correct with rare min A over 2 sessions Baseline:  Goal status: Deferred     LONG TERM GOALS: Target date: 07/26/22 Pt will achieve clear phonation over 12 minute conversation with rare min A over 2 sessions Baseline:  Goal status:Ongoing   2.  Pt will independently recall weekly/daily schedule, appointments events and pertinent information with memory aids over 2 sessions Baseline:  Goal status: Deferred   3.  Pt will complete vocal warm ups and follow 3 vocal hygiene strategies to return to singing for 5-10 minute periods with rare min A Baseline:  Goal status: Ongoing   4.  Improve score on Cognitive function PROM by 4 points Baseline: 101- filled out by spouse Goal status: Ongoing   ASSESSMENT:   CLINICAL IMPRESSION: Patient is a 76 y.o. male who is seen for dysarthria/voice and s/p CVA. Pt provided very detailed explanation of plans tomorrow with family and his MD appointment last week. SLP assisted pt today with improvied vocal quality with HEP for SOVTE. 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 I recommend cont'd skilled ST to maximize voice and cognition for safety,  to reduce caregiver burden and QOL.    OBJECTIVE IMPAIRMENTS include attention, memory, awareness, dysarthria, and voice disorder. These impairments are limiting patient from managing appointments, household responsibilities, and effectively communicating at home and in community. Factors affecting potential to achieve goals and functional outcome are ability to learn/carryover information.. Patient will benefit from skilled SLP services to address above impairments and improve overall function.   REHAB POTENTIAL: Good   PLAN: SLP FREQUENCY: 2x/week   SLP DURATION: 12 weeks   PLANNED INTERVENTIONS: Language facilitation, Environmental controls, Cueing hierachy, Cognitive reorganization, Internal/external aids, Functional tasks, Multimodal communication approach, and SLP instruction and feedback         [place], [place] 05/28/2022, 5:15 PM   Pt notes from 05-10-22 ST session with SLP - [place] (below)  Eliminate throat clearing - use a liquid sip with a hard swallow, a forceful blow with a hard swallow or dry hard swallow   Clearing your throat irritates and damages your vocal folds, causes more mucus and  contributes to your hoarseness     Semi-occluded vocal tract exercises (SOVTE)   These allow your vocal folds to vibrate without excess tension and promotes high placement of the voice   Use SOVTE as a warm up before prolonged speaking and vocal exercises     Hum in the straw for 10 breaths                                        NO TENSION!   Pitch Glides x10  with kleenex   ("Waterfall")   Accents (siren)  (lowest to highest pitch)   Hum the [employer] through straw   A goal would be 2-3 minutes several times a day and prior to vocal exercises   As always, use good belly breathing while completing SOVTE   Loud HAAAA with gentle onset - not harsh        We  use flow phonation to improve proper air flow during speech. Sounds  that promote air flow include:  s, z, sh, th, f, v, ch. We call this "flow speech"   After you complete the semi-occluded vocal tract exercises (straw exercises & gargling), complete the following twice a day   Use a tissue to focus on airflow:   Whooo 10x Shoe 10x Collie Siad 10x     Who are you?   Who is Collie Siad?     Twenty Thirty  Forty Fifty Sixty Seventy Eighty Ninety Hundred   She sells sea shells   See Sue's shoes   Fifty-Fifty   Hit the hammer   High school hero   Hocus Pocus   To each his own   Zebra's zig zag at the [place] chews [place] moms choose [place] prefers french fries   Vince vowed to vote   Feel the furry fish   She should polish her shoes   Show them the fresh fruit   Teachers eat ripe peaches at [employer]   Not now nor never   My mama makes me muffins   No one knows [place]'s nickname   See Gay [place]iller Sleep soundly by the sea              Electronically signed by Cliffton Asters, CCC-SLP at 05/10/2022 10:34 AM  Electronically signed by Cliffton Asters, CCC-SLP at 05/10/2022 11:01 AM

## 2022-05-30 ENCOUNTER — Inpatient Hospital Stay: Payer: Medicare Other

## 2022-05-30 ENCOUNTER — Inpatient Hospital Stay: Payer: Medicare Other | Attending: Internal Medicine

## 2022-05-30 VITALS — BP 133/57 | HR 61

## 2022-05-30 DIAGNOSIS — D469 Myelodysplastic syndrome, unspecified: Secondary | ICD-10-CM

## 2022-05-30 DIAGNOSIS — D461 Refractory anemia with ring sideroblasts: Secondary | ICD-10-CM | POA: Insufficient documentation

## 2022-05-30 DIAGNOSIS — N183 Chronic kidney disease, stage 3 unspecified: Secondary | ICD-10-CM | POA: Diagnosis not present

## 2022-05-30 LAB — HEMOGLOBIN AND HEMATOCRIT, BLOOD
HCT: 28.3 % — ABNORMAL LOW (ref 39.0–52.0)
Hemoglobin: 9.2 g/dL — ABNORMAL LOW (ref 13.0–17.0)

## 2022-05-30 MED ORDER — EPOETIN ALFA-EPBX 10000 UNIT/ML IJ SOLN
20000.0000 [IU] | Freq: Once | INTRAMUSCULAR | Status: AC
  Administered 2022-05-30: 20000 [IU] via SUBCUTANEOUS
  Filled 2022-05-30: qty 2

## 2022-05-31 ENCOUNTER — Ambulatory Visit: Payer: Medicare Other

## 2022-05-31 ENCOUNTER — Ambulatory Visit: Payer: Medicare Other | Admitting: Occupational Therapy

## 2022-05-31 DIAGNOSIS — R262 Difficulty in walking, not elsewhere classified: Secondary | ICD-10-CM

## 2022-05-31 DIAGNOSIS — M6281 Muscle weakness (generalized): Secondary | ICD-10-CM

## 2022-05-31 DIAGNOSIS — I69254 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: Secondary | ICD-10-CM

## 2022-05-31 DIAGNOSIS — R278 Other lack of coordination: Secondary | ICD-10-CM

## 2022-05-31 DIAGNOSIS — R2689 Other abnormalities of gait and mobility: Secondary | ICD-10-CM

## 2022-05-31 DIAGNOSIS — R471 Dysarthria and anarthria: Secondary | ICD-10-CM

## 2022-05-31 DIAGNOSIS — R2681 Unsteadiness on feet: Secondary | ICD-10-CM

## 2022-05-31 DIAGNOSIS — R41841 Cognitive communication deficit: Secondary | ICD-10-CM

## 2022-05-31 NOTE — Therapy (Signed)
OUTPATIENT SPEECH LANGUAGE PATHOLOGY TREATMENT NOTE   Patient Name: Vernon [name] MRN: [id number] DOB:03/29/1946, 76 y.o., male Today's Date: 05/31/2022  PCP: Baxter Hire, MD  REFERRING PROVIDER: Baxter Hire, MD   END OF SESSION:   End of Session - 05/31/22 1617     Visit Number 6    Number of Visits 25    Date for SLP Re-Evaluation 07/26/22    Authorization Type UHC    SLP Start Time 1617    SLP Stop Time  1658    SLP Time Calculation (min) 41 min    Activity Tolerance Patient tolerated treatment well                Past Medical History:  Diagnosis Date   Anemia    Aortic stenosis    Arthritis    Complication of anesthesia    hard time getting bp up after knee replacement   Diabetes mellitus without complication (HCC)    GERD (gastroesophageal reflux disease)    occ tums prn   History of hiatal hernia    Hypertension    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[place])    Past Surgical History:  Procedure Laterality Date   CARPAL TUNNEL RELEASE  2012   CRANIOTOMY N/A 02/10/2022   Procedure: SUBOCCIPITAL CRANIECTOMY FOR EVACUATION OF CEREBELLAR HEMATOMA;  Surgeon: Kristeen Miss, MD;  Location: [place];  Service: Neurosurgery;  Laterality: N/A;   JOINT REPLACEMENT Right 2010   SHOULDER ARTHROSCOPY WITH ROTATOR CUFF REPAIR AND OPEN BICEPS TENODESIS Right 11/09/2019   Procedure: RIGHT SHOULDER ARTHROSCOPY WITH SUBSCAPULARIS REPAIR, SUBACROMIAL DECOMPRESSION,MINI OPEN ROTATOR CUFF REPAIR;  Surgeon: Leim Fabry, MD;  Location: [facility];  Service: Orthopedics;  Laterality: Right;   Patient Active Problem List   Diagnosis Date Noted   Long term current use of anticoagulant 04/04/2022   Rotator cuff syndrome 04/04/2022   Exposure to potentially hazardous substance 04/04/2022   Gout 04/04/2022   Osteoarthritis 04/04/2022   Osteoporosis 04/04/2022   Other specified diseases of hair and hair follicles 70/11/7492   Pain in joint, lower leg 04/04/2022   Problem related to  unspecified psychosocial circumstances 04/04/2022   General medical examination for administrative purposes 04/04/2022   Stage 3b chronic kidney disease ([place])    Dysphagia, post-stroke    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 hemorrhage of brain ([place]) 02/26/2022   Cough 49/67/5916   Acute metabolic encephalopathy 38/46/6599   Obstructive hydrocephalus ([place]) 02/19/2022   Dyslipidemia 02/19/2022   Dysphagia 02/19/2022   AKI (acute kidney injury) ([place])    Hyperkalemia    Anemia    Pleural effusion on right    Respiratory failure requiring intubation (HCC)    Cerebral edema (HCC)    Chronic atrial fibrillation ([place])    Controlled type 2 diabetes mellitus with hyperglycemia, without long-term current use of insulin (HCC)    ICH (intracerebral hemorrhage) ([place]) 02/10/2022   Intracranial hemorrhage (HCC)    Anticoagulated    Moderate tricuspid regurgitation 07/26/2021   Moderate aortic valve stenosis 03/08/2020   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[place]) 02/18/2020   Macrocytic anemia 01/13/2020   Spleen enlargement 01/13/2020   Bilateral carotid artery stenosis 07/14/2018   Atrial fibrillation, chronic ([place]) 07/14/2018   Type 2 diabetes with nephropathy ([place]) 02/14/2016   Essential hypertension 08/16/2014   Hyperlipemia, mixed 08/16/2014   Renal insufficiency 08/16/2014    PERTINENT HISTORY:  March 2023 experienced hemorrhage in cerebellar peduncle and underwent craniotomy for evacuation. Notes his LUE and LLE are most affected at present. Underwent acute hospitalization,  and [facility]. Recently D/C from home health on 04/30/22. Did not receive HHST, only HHPT/OT.     ONSET DATE: 02/10/22   REFERRING DIAG: I62.9 (ICD-10-CM) - Nontraumatic intracranial hemorrhage, unspecified   THERAPY DIAG:  Dysarthria and anarthria  Cognitive communication deficit  Rationale for Evaluation and Treatment Rehabilitation  SUBJECTIVE: "I notice my left hand is better when I'm slow and deliberate." Pt told SLP  details about each of his previous two therapies today.   PAIN:  Are you having pain? Yes NPRS scale: 6/28, with certain movement Pain location: rt shoulder Pain orientation: Lower  PAIN TYPE: ache Pain description: intermittent  Aggravating factors: certain movement Relieving factors: massage, rest   OBJECTIVE:   TODAY'S TREATMENT: 05/31/22: Pt entered with his own folder today; "SEE I TOLD YOU I'D REMEMBER IT!" He had other things that he did each day written on his calendar as well. SLP introduced PHOrTE exercises to pt today (see pt instructions) and worked with him to achieve adequate breath prior to each rep (pt would rush at times and not achieve adequate breath support). SLP provided Vernon [name] with detailed instructions and also educated wife, alone in lobby at the time, on each exercise. No throat clearing the last two sessions.  05/28/22: Pt provided very detailed explanation regarding of his nephrology appointment last week, and about his plans for tomorrow with family. He stated next session he will bring his folder, instead of relying on [name]. SLP worked with pt on his voice exercises, requiring min A occasionally for strong voice without squeezing, and min A usually for abdominal breathing. Pt's loud /a/ was produced with WNL voicing 50%.   05/24/22: Semi-occluded vocal tract (SOVTE) exercises completed today with SLP min A due to pt strained voice. Good airflow with mostly all reps of exercises. Pt stated he has been practicing highlighted exercises BID. Loud "ha" with tremulous vocal quality, but mostly withotut strained voice (following SOVTE exercises). Mid-upper 80s dB average for "ha". In conversation pt used WNL voicing ~50% of the time.  Again, no dysnomic errors today.  6/22/23[name] checked in at 1150. Prior to session with pt and [name] alone in lobby [name] told SLP, "[name] can sing this week, it's better than last week!" SLP notes some spontaneous WNL voicing today with  incr'd diaphragmatic effort. SLP highlighted this to pt and had pt read 5-7 word sentences and WNL quality heard 35% of the time. SLP had pt "do your homework like you've been doing it at home." Pt began with strained loud "ha" x3 reps and SLP stopped pt. SLP explained HEP is a progression of exercises and told pt to start with the first exercise - SLP req'd to cue pt where first exercise was. Semi-occluded vocal tract (SOVTE) exercises completed with SLP assistance necessary for attention skills. SLP used visual cues by highlighting the SOVTE SLP wants pt to focus on until next session. Using SOVTE prior to loud "ha" pt had much more WNL voicing without vocal strain. Mid-upper 80s dB for "ha" with WNL voicing for first 4 reps and notable fatigue on 2 subsequent reps.  No dysnomia noted today in session. Pt told SLP he had nephrologist appt at 3pm today, and that SLP was only session today however could not locate correct day on calendar without extra time and min A from SLP.       PATIENT EDUCATION: Education details: See "treatment" above Person educated: Patient  Education method: Explanation, Demonstration, Verbal cues, Handouts, and  Education comprehension: verbalized understanding, returned demonstration, verbal cues required, and needs further education         GOALS: Goals reviewed with patient? Yes   SHORT TERM GOALS: Target date: 05/31/22 Pt will complete HEP for voice/dysarthria with occasional min A over 2 sessions Baseline: 05/24/22 Goal status: Met   2.  Pt will use external aids to recall daily events/schedule with occasional min A from spouse over 1 week Baseline:  Goal status: Deferred   3.  Pt will achieve clear phonation 18/20 sentences in structured task Baseline:  Goal status: Not met   4.  Pt will ID dysnomic errors and self correct with rare min A over 2 sessions Baseline:  Goal status: Deferred     LONG TERM GOALS: Target date: 07/26/22 Pt will achieve clear  phonation over 10 minute conversation with rare min A over 2 sessions Baseline:  Goal status: Revised   2.  Pt will independently recall weekly/daily schedule, appointments events and pertinent information with memory aids over 2 sessions Baseline:  Goal status: Deferred   3.  Pt will complete vocal warm ups and follow 3 vocal hygiene strategies to return to singing for 5-10 minute periods with rare min A Baseline:  Goal status: Ongoing   4.  Improve score on Cognitive function PROM by 4 points Baseline: 101- filled out by spouse Goal status: Ongoing   ASSESSMENT:   CLINICAL IMPRESSION: Patient is a 76 y.o. male who is seen for dysarthria/voice and s/p CVA. Pt provided very detailed explanation of plans tomorrow with family and his MD appointment last week. SLP assisted pt today with developing  HEP to improve voice quality using PHOrTE exercises. He brought in his folder today independently and had items from this week included on it. 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 I recommend cont'd skilled ST to maximize voice and cognition for safety, to reduce caregiver burden and QOL.    OBJECTIVE IMPAIRMENTS include attention, memory, awareness, dysarthria, and voice disorder. These impairments are limiting patient from managing appointments, household responsibilities, and effectively communicating at home and in community. Factors affecting potential to achieve goals and functional outcome are ability to learn/carryover information.. Patient will benefit from skilled SLP services to address above impairments and improve overall function.   REHAB POTENTIAL: Good   PLAN: SLP FREQUENCY: 2x/week   SLP DURATION: 12 weeks   PLANNED INTERVENTIONS: Language facilitation, Environmental controls, Cueing hierachy, Cognitive reorganization, Internal/external aids, Functional tasks, Multimodal communication approach, and SLP instruction and feedback         [place],  [place] 05/31/2022, 5:13 PM

## 2022-05-31 NOTE — Therapy (Signed)
OUTPATIENT OCCUPATIONAL THERAPY Treatment Note  Patient Name: Vernon [name] MRN: [id number] DOB:03/28/1946, 76 y.o., male Today's Date: 05/31/2022  PCP: Baxter Hire, MD REFERRING PROVIDER: Baxter Hire, MD   OT End of Session - 05/31/22 1559     Visit Number 6    Number of Visits 17    Date for OT Re-Evaluation 07/06/22    Authorization Type U[employer] Medicare    Authorization Time Period VL: [place]    OT Start Time 1455    OT Stop Time 1534    OT Time Calculation (min) 39 min    Activity Tolerance Patient tolerated treatment well    Behavior During Therapy WFL for tasks assessed/performed                 Past Medical History:  Diagnosis Date   Anemia    Aortic stenosis    Arthritis    Complication of anesthesia    hard time getting bp up after knee replacement   Diabetes mellitus without complication (HCC)    GERD (gastroesophageal reflux disease)    occ tums prn   History of hiatal hernia    Hypertension    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[place])    Past Surgical History:  Procedure Laterality Date   CARPAL TUNNEL RELEASE  2012   CRANIOTOMY N/A 02/10/2022   Procedure: SUBOCCIPITAL CRANIECTOMY FOR EVACUATION OF CEREBELLAR HEMATOMA;  Surgeon: Kristeen Miss, MD;  Location: [facility];  Service: Neurosurgery;  Laterality: N/A;   JOINT REPLACEMENT Right 2010   SHOULDER ARTHROSCOPY WITH ROTATOR CUFF REPAIR AND OPEN BICEPS TENODESIS Right 11/09/2019   Procedure: RIGHT SHOULDER ARTHROSCOPY WITH SUBSCAPULARIS REPAIR, SUBACROMIAL DECOMPRESSION,MINI OPEN ROTATOR CUFF REPAIR;  Surgeon: Leim Fabry, MD;  Location: [facility];  Service: Orthopedics;  Laterality: Right;   Patient Active Problem List   Diagnosis Date Noted   Long term current use of anticoagulant 04/04/2022   Rotator cuff syndrome 04/04/2022   Exposure to potentially hazardous substance 04/04/2022   Gout 04/04/2022   Osteoarthritis 04/04/2022   Osteoporosis 04/04/2022   Other specified diseases of hair and hair  follicles 89/38/1017   Pain in joint, lower leg 04/04/2022   Problem related to unspecified psychosocial circumstances 04/04/2022   General medical examination for administrative purposes 04/04/2022   Stage 3b chronic kidney disease ([place])    Dysphagia, post-stroke    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 hemorrhage of brain ([place]) 02/26/2022   Cough 51/12/5850   Acute metabolic encephalopathy 77/82/4235   Obstructive hydrocephalus ([place]) 02/19/2022   Dyslipidemia 02/19/2022   Dysphagia 02/19/2022   AKI (acute kidney injury) ([place])    Hyperkalemia    Anemia    Pleural effusion on right    Respiratory failure requiring intubation (HCC)    Cerebral edema (HCC)    Chronic atrial fibrillation ([place])    Controlled type 2 diabetes mellitus with hyperglycemia, without long-term current use of insulin (HCC)    ICH (intracerebral hemorrhage) ([place]) 02/10/2022   Intracranial hemorrhage (HCC)    Anticoagulated    Moderate tricuspid regurgitation 07/26/2021   Moderate aortic valve stenosis 03/08/2020   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[place]) 02/18/2020   Macrocytic anemia 01/13/2020   Spleen enlargement 01/13/2020   Bilateral carotid artery stenosis 07/14/2018   Atrial fibrillation, chronic ([place]) 07/14/2018   Type 2 diabetes with nephropathy ([place]) 02/14/2016   Essential hypertension 08/16/2014   Hyperlipemia, mixed 08/16/2014   Renal insufficiency 08/16/2014    ONSET DATE: 02/10/2022  REFERRING DIAG: I62.9 (ICD-10-CM) - Nontraumatic intracranial hemorrhage, unspecified  THERAPY DIAG:  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 Ellene Route on 02/11/22 w/ EVD pulled 02/14/22. PMH includes DMT2, HTN, HLD, atrial fibrillation on Eliquis, arthritis, anemia, and GERD  PAIN: Are you having pain? Yes left knee pain.  2/10 right now sitting up in w/c.  Pain description: discomfort Rest helps decrease pain, certain movement and walking increases pain.  PATIENT GOALS: Be independent again; play guitar  OBJECTIVE:   FUNCTIONAL OUTCOME MEASURES: FOTO: 2   TODAY'S TREATMENT:  Functional reaching in sitting position with focus on crossing midline and reaching outside BOS to retreive resistive clothespins from vertical surface to challenge motor control of LUE.  Utilized 4 and 6# resistance clothespins with pt demonstrating good strength when squeezing clips, however continues to demonstrate significant ataxia when reaching outside body.  Therapist again provided verbal cues for slowed pace and to visually attend to LUE during reaching task.  Therapist directing pt to increase reach when reaching outside BOS. Speed and agility pods in sitting and standing with focus on quick, yet coordinated movements to select specific target based on illumination in random order.  Therapist directed pt in slow and deliberate movements and then challenged pt to complete with more reactionary movements to further challenge motor control.  Pt continues to demonstrate ataxia with more quick movements.   Engaged in [place] 4 in sitting with focus on fine and gross motor coordination with picking up small Connect 4 pieces and then placing them in grid with LUE.  Pt demonstrating min difficulty with picking up pieces and min-mod ataxia when placing in Connect 4 grid.  Pt demonstrating good visual scanning and awareness to complete task per instructions.    PATIENT EDUCATION: Educated on coordination tasks and visual attention to task to increase coordination. Person educated: Patient Education method: Explanation Education  comprehension: verbalized understanding   HOME EXERCISE PROGRAM: Coordination handout - see pt instructions   GOALS: Goals reviewed with patient? Yes  SHORT TERM GOALS: Target date: 06/08/22  STG  Status:  1 Pt will demonstrate independence w/ initial HEP designed for LUE GM and [place] Baseline: No HEP at this time Progressing  2 Pt will improve participation in functional bilateral FM tasks as evidenced by decreasing time to complete 9-Hole Peg Test w/ LUE by at least 6 sec Baseline: Right: 21.8  sec; Left: 51.2 sec Progressing  3 Pt will be able to complete simulated bilateral functional task (e.g. cutting food, manipulating clothing fasteners, washing dishes) within reasonable amount of time Baseline: Difficulty w/ bilateral tasks Progressing     LONG TERM GOALS: Target date: 07/06/22  LTG  Status:  1 Pt will improve participation in functional activities as evidenced by increasing FOTO score to 71 by d/c Baseline: 62 Progressing  2 Pt will demonstrate improved control and accuracy w/ LUE by completing 8 taps within 10 seconds during finger to nose test Baseline: Right: 12 taps in 10 sec; Left: 6 taps in 10 sec Progressing  3 Pt will improve Box and Blocks score to at least 40 blocks by d/c to indicate improved unilateral GMC of LUE Baseline: Right 61 blocks, Left 29 blocks Progressing  4 Pt will safely demonstrate  simulated grilling activity, incorporating compensatory strategies/AE prn, w/ Mod I by d/c Baseline: Unable to complete at this time Progressing     ASSESSMENT:  CLINICAL IMPRESSION: Treatment session with focus on fine and gross motor coordination in sitting and standing position.  Pt continues to demonstrate ataxia with gross and fine motor tasks, benefiting from verbal cues to slow down and visually attend to tasks to increase motor control during all tasks due to completing open chain tasks this session.    PERFORMANCE DEFICITS in functional skills including ADLs,  IADLs, coordination, dexterity, sensation, strength, FMC, GMC, mobility, balance, body mechanics, and UE functional use.  IMPAIRMENTS are limiting patient from ADLs, IADLs, and leisure.   COMORBIDITIES has co-morbidities such as atrial fibrillation on Eliquis and DMT2  that affects occupational performance. Patient will benefit from skilled OT to address above impairments and improve overall function.  MODIFICATION OR ASSISTANCE TO COMPLETE EVALUATION: Min-Moderate modification of tasks or assist with assess necessary to complete an evaluation.  OT OCCUPATIONAL PROFILE AND HISTORY: Detailed assessment: Review of records and additional review of physical, cognitive, psychosocial history related to current functional performance.  CLINICAL DECISION MAKING: Moderate - several treatment options, min-mod task modification necessary  REHAB POTENTIAL: Good  EVALUATION COMPLEXITY: Moderate   PLAN: OT FREQUENCY: 2x/week  OT DURATION: 8 weeks  PLANNED INTERVENTIONS: self care/ADL training, therapeutic exercise, therapeutic activity, neuromuscular re-education, manual therapy, functional mobility training, aquatic therapy, electrical stimulation, ultrasound, biofeedback, moist heat, cryotherapy, patient/family education, energy conservation, and DME and/or AE instructions  RECOMMENDED OTHER SERVICES: To receive PT/ST services at this location  CONSULTED AND AGREED WITH PLAN OF CARE: Patient and family member/caregiver  PLAN FOR NEXT SESSION:   GMC/FMC activities for LUE. Seated vs standing weight-bearing, closed-chain exercise, coordination activities, etc.  Functional in nature, if possible, to carry over to pt desire to play guitar again.   Simonne Come, OTR/L 05/31/2022, 3:59 PM

## 2022-05-31 NOTE — Patient Instructions (Addendum)
   GOOD BELLY BREATH for every rep of each exercise  Warmup: -Do 5-10 waterfalls (hi to lo pitch) to warm and loosen up your voice  SAY THEM EASY - not strained or tight voice  PHOrTE ("forte") exericses - to strengthen your voice - 10 Strong "haaaaaaaaa" higher pitch - go as long as you can without pushing the last  little bit of air out - Say strongly  one,  two,  three,  Four - - up to 10 (skip 7)  - Say 10 sentences like you are calling over the fence to your neighbor (higher pitch) - Then the same 10 sentences like you are scolding your dog  (lower pitch)

## 2022-05-31 NOTE — Therapy (Signed)
OUTPATIENT PHYSICAL THERAPY TREATMENT NOTE   Patient Name: Vernon [name] MRN: [id number] DOB:05-Oct-1946, 76 y.o., male Today's Date: 05/31/2022  PCP: Baxter Hire, MD  REFERRING PROVIDER: Baxter Hire, MD   END OF SESSION:   PT End of Session - 05/31/22 1540     Visit Number 7    Number of Visits 16    Date for PT Re-Evaluation 07/03/22    Authorization Type UHC Medicare    Progress Note Due on Visit 10    PT Start Time 1530    PT Stop Time 1615    PT Time Calculation (min) 45 min    Equipment Utilized During Treatment Gait belt    Activity Tolerance Patient tolerated treatment well    Behavior During Therapy WFL for tasks assessed/performed              Past Medical History:  Diagnosis Date   Anemia    Aortic stenosis    Arthritis    Complication of anesthesia    hard time getting bp up after knee replacement   Diabetes mellitus without complication (HCC)    GERD (gastroesophageal reflux disease)    occ tums prn   History of hiatal hernia    Hypertension    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[place])    Past Surgical History:  Procedure Laterality Date   CARPAL TUNNEL RELEASE  2012   CRANIOTOMY N/A 02/10/2022   Procedure: SUBOCCIPITAL CRANIECTOMY FOR EVACUATION OF CEREBELLAR HEMATOMA;  Surgeon: Kristeen Miss, MD;  Location: [facility];  Service: Neurosurgery;  Laterality: N/A;   JOINT REPLACEMENT Right 2010   SHOULDER ARTHROSCOPY WITH ROTATOR CUFF REPAIR AND OPEN BICEPS TENODESIS Right 11/09/2019   Procedure: RIGHT SHOULDER ARTHROSCOPY WITH SUBSCAPULARIS REPAIR, SUBACROMIAL DECOMPRESSION,MINI OPEN ROTATOR CUFF REPAIR;  Surgeon: Leim Fabry, MD;  Location: [facility];  Service: Orthopedics;  Laterality: Right;   Patient Active Problem List   Diagnosis Date Noted   Long term current use of anticoagulant 04/04/2022   Rotator cuff syndrome 04/04/2022   Exposure to potentially hazardous substance 04/04/2022   Gout 04/04/2022   Osteoarthritis 04/04/2022    Osteoporosis 04/04/2022   Other specified diseases of hair and hair follicles 95/07/3266   Pain in joint, lower leg 04/04/2022   Problem related to unspecified psychosocial circumstances 04/04/2022   General medical examination for administrative purposes 04/04/2022   Stage 3b chronic kidney disease ([place])    Dysphagia, post-stroke    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 hemorrhage of brain ([place]) 02/26/2022   Cough 12/45/8099   Acute metabolic encephalopathy 83/38/2505   Obstructive hydrocephalus ([place]) 02/19/2022   Dyslipidemia 02/19/2022   Dysphagia 02/19/2022   AKI (acute kidney injury) ([place])    Hyperkalemia    Anemia    Pleural effusion on right    Respiratory failure requiring intubation (HCC)    Cerebral edema (HCC)    Chronic atrial fibrillation ([place])    Controlled type 2 diabetes mellitus with hyperglycemia, without long-term current use of insulin (HCC)    ICH (intracerebral hemorrhage) ([place]) 02/10/2022   Intracranial hemorrhage (HCC)    Anticoagulated    Moderate tricuspid regurgitation 07/26/2021   Moderate aortic valve stenosis 03/08/2020   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[place]) 02/18/2020   Macrocytic anemia 01/13/2020   Spleen enlargement 01/13/2020   Bilateral carotid artery stenosis 07/14/2018   Atrial fibrillation, chronic ([place]) 07/14/2018   Type 2 diabetes with nephropathy ([place]) 02/14/2016   Essential hypertension 08/16/2014   Hyperlipemia, mixed 08/16/2014   Renal insufficiency 08/16/2014    REFERRING DIAG:  I62.9 (ICD-10-CM) - Nontraumatic intracranial hemorrhage, unspecified   THERAPY DIAG:  Muscle weakness (generalized)  Unsteadiness on feet  Other abnormalities of gait and mobility  Difficulty in walking, not elsewhere classified  Rationale for Evaluation and Treatment Rehabilitation  PERTINENT HISTORY: see MAR  PRECAUTIONS: fall risk  SUBJECTIVE: Left knee is still feeling sore from Monday's session  PAIN:  Left knee: 4-5/10 -- indicates joint line,  ache/discomfort   OBJECTIVE:   TODAY'S TREATMENT: 05/31/22 Activity Comments  LAQ 3# 3x10   Standing hamstring curls 3# 2x10   Alt stair taps x 60 sec BUE support, trial no UE with left LOB  Foot on stair  2x 30 sec No UE support, EC 6" step  Standing on foam x 30 sec EO/EC head rotation/vertical  180 degree turns left/right in parallel bars for safety Playing card retrieval from table top  Gait training  CGA with RW and LLE crossing midline. Use of 1.5# ankle weight applied which improved gait deviation limiting midline excursion       DIAGNOSTIC FINDINGS: see MAR   COGNITION: Overall cognitive status: Within functional limits for tasks assessed             SENSATION: WFL   COORDINATION: Bradykinesia, difficulty wit rapid alternating movements     MUSCLE TONE: WFL       POSTURE: No Significant postural limitations   LOWER EXTREMITY ROM:      WFL  (Blank rows = not tested)   LOWER EXTREMITY MMT:     MMT Right Eval Left Eval  Hip flexion 5 4  Hip extension 5 3+  Hip abduction 5 3-  Hip adduction      Hip internal rotation      Hip external rotation      Knee flexion 5 4  Knee extension 5 4  Ankle dorsiflexion 5 4+  Ankle plantarflexion      Ankle inversion      Ankle eversion      (Blank rows = not tested)   BED MOBILITY:  Independent   TRANSFERS: Assistive device utilized: Environmental consultant - 2 wheeled  Sit to stand: Modified independence Stand to sit: Modified independence Chair to chair: SBA and CGA Floor:  DNT   RAMP:  Not available   CURB:  Level of Assistance: SBA and CGA Assistive device utilized: Environmental consultant - 2 wheeled Curb Comments: undershoots with LLE   STAIRS:           Level of Assistance: SBA and CGA           Stair Negotiation Technique: Alternating Pattern  with Bilateral Rails           Number of Stairs: 6             Height of Stairs: 4-6"            Comments: dysmetria w/ LLE   GAIT: Gait pattern: circumduction- Left and  ataxic Distance walked: 100 Assistive device utilized: Walker - 2 wheeled Level of assistance: SBA Comments: dysmetria LLE   FUNCTIONAL TESTs:  Timed up and go (TUG): 20.25 with RW Berg Balance Scale: 35/56      M-CTSIB  Condition 1: Firm Surface, EO 30 Sec, Mild Sway  Condition 2: Firm Surface, EC 30 Sec, Mild and Moderate Sway  Condition 3: Foam Surface, EO 9 Sec, Severe Sway  Condition 4: Foam Surface, EC 0 Sec,  unable  Sway    PATIENT SURVEYS:  FOTO 54.43% functional status  TODAY'S TREATMENT:  Evaluation, sidestepping along counter, tandem stance at counter     PATIENT EDUCATION: Education details: scope of PT intervention Person educated: Patient and Spouse Education method: Explanation Education comprehension: verbalized understanding     HOME EXERCISE PROGRAM: sidestepping along counter, tandem stance at counter       GOALS: Goals reviewed with patient? Yes   SHORT TERM GOALS: Target date: 05/29/2022   Patient will perform HEP with family/caregiver supervision for improved strength, balance, transfers, and gait  Baseline: Goal status: INITIAL   2.  Patient will achieve 15 seconds for TUG test to manifest reduced risk for falls Baseline: 20.25 with RW Goal status: INITIAL   3. Demo modified independent gait level surfaces and curb negotiation            Baseline: CGA-SBA with RW            Goal status: INITIAL   LONG TERM GOALS: Target date: 06/26/2022   Patient will demonstrate score 45/56 Berg Balance Test to manifest low risk for falls Baseline: 35/56 Goal status: INITIAL   2.  Demonstrate improved static balance and postural control per time of 15 sec condition 4 M-CTSIB to improve safety with mobility on uneven surfaces Baseline: complete LOB left with condition 3 Goal status: INITIAL   3.  Demo modified independent gait using least restrictive AD over various surfaces and ascend/descend stairs with set-up assist to improve functional  mobility Baseline:  Goal status: INITIAL   4.  Demo score of 57% functional status FOTO  Baseline: 54.43% Goal status: INITIAL       ASSESSMENT:   CLINICAL IMPRESSION: Demonstrates decreased kinesthetic awareness left side with improvements in static balance/proprioception appreciated today on firm/compliant surfaces with verbal cues for increased use of somatosensory, e.g. 50/50 weight on legs to compensate with good result.  Difficulty with dynamic balance as evidenced by alternating stair taps resulting in left LOB when UE support is eliminated.  Postural instability and delayed righting reactions evident during 180 degree turns resulting in 2 episodes of left LOB and anterior LOB out of 12 trials requiring physical assist to correct. Gait training initiated with RW and CGA for safety and demonstrating LLE crossing midline approx 20-30% of the time in swing phase. Placement of 1.5# ankle weight on LLE eliminated this. Continued sessions indicated to progress balance, motor control, kinesthetic awareness, and facilitate return to modified independent ambulation with low risk for falls to enable return to PLOF   OBJECTIVE IMPAIRMENTS Abnormal gait, decreased activity tolerance, decreased balance, decreased coordination, decreased endurance, decreased knowledge of use of DME, difficulty walking, decreased strength, impaired perceived functional ability, and improper body mechanics.    ACTIVITY LIMITATIONS carrying, lifting, bending, standing, squatting, stairs, transfers, and locomotion level   PARTICIPATION LIMITATIONS: meal prep, cleaning, laundry, driving, shopping, community activity, and yard work   PERSONAL FACTORS Age, Fitness, and Time since onset of injury/illness/exacerbation are also affecting patient's functional outcome.    REHAB POTENTIAL: Excellent   CLINICAL DECISION MAKING: Evolving/moderate complexity   EVALUATION COMPLEXITY: Moderate   PLAN: PT FREQUENCY: 1-2x/week    PT DURATION: 9 weeks   PLANNED INTERVENTIONS: Therapeutic exercises, Therapeutic activity, Neuromuscular re-education, Balance training, Gait training, Patient/Family education, Joint mobilization, Stair training, Vestibular training, Canalith repositioning, DME instructions, Aquatic Therapy, Electrical stimulation, Wheelchair mobility training, and Manual therapy   PLAN FOR NEXT SESSION: Continue with static balance in corner for safety; continue to review safety with functional transfers  4:26 PM, 05/31/22 M. Sherlyn Lees, PT, DPT Physical Therapist- [place] Office Number: [phone]

## 2022-06-04 ENCOUNTER — Ambulatory Visit: Payer: Medicare Other | Admitting: Occupational Therapy

## 2022-06-04 ENCOUNTER — Ambulatory Visit: Payer: Medicare Other | Admitting: Physical Therapy

## 2022-06-04 ENCOUNTER — Encounter: Payer: Self-pay | Admitting: Physical Therapy

## 2022-06-04 ENCOUNTER — Ambulatory Visit: Admission: RE | Admit: 2022-06-04 | Payer: Medicare Other | Source: Ambulatory Visit

## 2022-06-04 DIAGNOSIS — R2689 Other abnormalities of gait and mobility: Secondary | ICD-10-CM

## 2022-06-04 DIAGNOSIS — R2681 Unsteadiness on feet: Secondary | ICD-10-CM

## 2022-06-04 DIAGNOSIS — I69254 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: Secondary | ICD-10-CM | POA: Diagnosis not present

## 2022-06-04 DIAGNOSIS — M6281 Muscle weakness (generalized): Secondary | ICD-10-CM

## 2022-06-04 DIAGNOSIS — R278 Other lack of coordination: Secondary | ICD-10-CM

## 2022-06-04 NOTE — Therapy (Signed)
OUTPATIENT OCCUPATIONAL THERAPY Treatment Note  Patient Name: Vernon [name] MRN: [id number] DOB:Jan 27, 1946, 76 y.o., male Today's Date: 06/04/2022  PCP: Baxter Hire, MD REFERRING PROVIDER: Baxter Hire, MD   OT End of Session - 06/04/22 1542     Visit Number 7    Number of Visits 17    Date for OT Re-Evaluation 07/06/22    Authorization Type UHC Medicare    Authorization Time Period VL: [place]    OT Start Time 1534    OT Stop Time 1615    OT Time Calculation (min) 41 min    Activity Tolerance Patient tolerated treatment well    Behavior During Therapy WFL for tasks assessed/performed                  Past Medical History:  Diagnosis Date   Anemia    Aortic stenosis    Arthritis    Complication of anesthesia    hard time getting bp up after knee replacement   Diabetes mellitus without complication (HCC)    GERD (gastroesophageal reflux disease)    occ tums prn   History of hiatal hernia    Hypertension    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[place])    Past Surgical History:  Procedure Laterality Date   CARPAL TUNNEL RELEASE  2012   CRANIOTOMY N/A 02/10/2022   Procedure: SUBOCCIPITAL CRANIECTOMY FOR EVACUATION OF CEREBELLAR HEMATOMA;  Surgeon: Kristeen Miss, MD;  Location: [facility];  Service: Neurosurgery;  Laterality: N/A;   JOINT REPLACEMENT Right 2010   SHOULDER ARTHROSCOPY WITH ROTATOR CUFF REPAIR AND OPEN BICEPS TENODESIS Right 11/09/2019   Procedure: RIGHT SHOULDER ARTHROSCOPY WITH SUBSCAPULARIS REPAIR, SUBACROMIAL DECOMPRESSION,MINI OPEN ROTATOR CUFF REPAIR;  Surgeon: Leim Fabry, MD;  Location: [facility];  Service: Orthopedics;  Laterality: Right;   Patient Active Problem List   Diagnosis Date Noted   Long term current use of anticoagulant 04/04/2022   Rotator cuff syndrome 04/04/2022   Exposure to potentially hazardous substance 04/04/2022   Gout 04/04/2022   Osteoarthritis 04/04/2022   Osteoporosis 04/04/2022   Other specified diseases of hair and  hair follicles 38/08/1750   Pain in joint, lower leg 04/04/2022   Problem related to unspecified psychosocial circumstances 04/04/2022   General medical examination for administrative purposes 04/04/2022   Stage 3b chronic kidney disease ([place])    Dysphagia, post-stroke    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 hemorrhage of brain ([place]) 02/26/2022   Cough 02/58/5277   Acute metabolic encephalopathy 82/42/3536   Obstructive hydrocephalus ([place]) 02/19/2022   Dyslipidemia 02/19/2022   Dysphagia 02/19/2022   AKI (acute kidney injury) ([place])    Hyperkalemia    Anemia    Pleural effusion on right    Respiratory failure requiring intubation (HCC)    Cerebral edema (HCC)    Chronic atrial fibrillation ([place])    Controlled type 2 diabetes mellitus with hyperglycemia, without long-term current use of insulin (HCC)    ICH (intracerebral hemorrhage) ([place]) 02/10/2022   Intracranial hemorrhage (HCC)    Anticoagulated    Moderate tricuspid regurgitation 07/26/2021   Moderate aortic valve stenosis 03/08/2020   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[place]) 02/18/2020   Macrocytic anemia 01/13/2020   Spleen enlargement 01/13/2020   Bilateral carotid artery stenosis 07/14/2018   Atrial fibrillation, chronic ([place]) 07/14/2018   Type 2 diabetes with nephropathy ([place]) 02/14/2016   Essential hypertension 08/16/2014   Hyperlipemia, mixed 08/16/2014   Renal insufficiency 08/16/2014    ONSET DATE: 02/10/2022  REFERRING DIAG: I62.9 (ICD-10-CM) - Nontraumatic intracranial hemorrhage, unspecified  THERAPY  DIAG:  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 Ellene Route on 02/11/22 w/ EVD pulled 02/14/22. PMH includes DMT2, HTN, HLD, atrial fibrillation on Eliquis, arthritis, anemia, and GERD  PAIN: Are you having pain? No  PATIENT GOALS: Be independent again; play guitar  OBJECTIVE:   FUNCTIONAL OUTCOME MEASURES: FOTO: 62   TODAY'S TREATMENT:  Shoulder flexion with dowel with focus on increasing shoulder flexion, symmetry with use of BUE, and increased motor control.  Pt completed 1 set of 10 without weight, 2 sets of 10 with 1# dowel rod.  Pt demonstrating improved motor control and symmetry with use of BUE.  Chest presses 2 sets of 10 with 1# dowel.  Pt demonstrating good postural control and no shoulder hike or ataxia with use of BUE.   Single shoulder flexion with 2 sets of 10 with 1# dowel.  Pt demonstrating ability to complete with little to no shoulder hike, however demonstrating mild ataxia with shoulder flexion and extension.  Pt demonstrating improvements when "focusing" on task.   Pattern replication with picking up large pegs and placing in resistive peg board to challenge fine and gross motor control.  Therapist directing pt to pick up and rotated pegs 1/2 turn to then place in peg board to replicate pattern.  Pt continues to demonstrate min ataxia but able to complete dropping 3 of 25 pegs.  Transitioned to completing small peg board pattern replication with increased difficulty.  Pt dropping 25% of pegs. Retested 9 hole peg test: L: 51.66 seconds.    PATIENT EDUCATION: Educated on coordination tasks and visual attention to task to increase coordination. Person educated: Patient Education method: Explanation Education comprehension: verbalized understanding   HOME EXERCISE PROGRAM: Coordination handout - see pt instructions  Access Code: [email or web address] URL: [email or web address] Date: 06/04/2022 Prepared by: [facility]  Exercises - Seated Shoulder Flexion with Dowel to 90  - 4 x weekly - 2-3 sets - 10 reps - Seated Chest Press with Bar  - 4 x weekly - 2-3 sets - 10 reps - Single Arm Shoulder Flexion with Dumbbell  - 4 x weekly - 2-3 sets - 10 reps - Seated Shoulder External Rotation with Dumbbell  - 4 x weekly - 2-3 sets - 10 reps   GOALS: Goals reviewed with patient? Yes  SHORT TERM GOALS: Target date: 06/08/22  STG  Status:  1 Pt will demonstrate independence w/ initial HEP designed for LUE GM and [place] Baseline: No HEP at this time Progressing  2 Pt will improve participation in functional bilateral FM tasks as evidenced by decreasing time to complete 9-Hole Peg Test w/ LUE by at least 6 sec Baseline: Right: 21.8  sec; Left: 51.2 sec Progressing - 06/04/22 51.66 sec  3 Pt will be able to complete simulated bilateral functional task (e.g. cutting food, manipulating clothing fasteners, washing dishes) within reasonable amount of time Baseline: Difficulty w/ bilateral tasks Progressing     LONG TERM GOALS: Target date: 07/06/22  LTG  Status:  1 Pt will improve participation in functional activities as  evidenced by increasing FOTO score to 71 by d/c Baseline: 62 Progressing  2 Pt will demonstrate improved control and accuracy w/ LUE by completing 8 taps within 10 seconds during finger to nose test Baseline: Right: 12 taps in 10 sec; Left: 6 taps in 10 sec Progressing  3 Pt will improve Box and Blocks score to at least 40 blocks by d/c to indicate improved unilateral GMC of LUE Baseline: Right 61 blocks, Left 29 blocks Progressing  4 Pt will safely demonstrate simulated grilling activity, incorporating compensatory strategies/AE prn, w/ Mod I by d/c Baseline: Unable to complete at this time Progressing     ASSESSMENT:  CLINICAL IMPRESSION: Treatment session with focus on fine and gross motor coordination in sitting position.  Engaged in [place] with focus  on shoulder flexion and chest presses initially with BUE with use of dowel, progressing to single shoulder flexion to challenge motor control. Pt continues to demonstrate ataxia with gross and fine motor tasks, benefiting from verbal cues to slow down and visually attend to tasks to increase motor control during all tasks due to completing open chain tasks this session.    PERFORMANCE DEFICITS in functional skills including ADLs, IADLs, coordination, dexterity, sensation, strength, FMC, GMC, mobility, balance, body mechanics, and UE functional use.  IMPAIRMENTS are limiting patient from ADLs, IADLs, and leisure.   COMORBIDITIES has co-morbidities such as atrial fibrillation on Eliquis and DMT2  that affects occupational performance. Patient will benefit from skilled OT to address above impairments and improve overall function.  MODIFICATION OR ASSISTANCE TO COMPLETE EVALUATION: Min-Moderate modification of tasks or assist with assess necessary to complete an evaluation.  OT OCCUPATIONAL PROFILE AND HISTORY: Detailed assessment: Review of records and additional review of physical, cognitive, psychosocial history related to current functional performance.  CLINICAL DECISION MAKING: Moderate - several treatment options, min-mod task modification necessary  REHAB POTENTIAL: Good  EVALUATION COMPLEXITY: Moderate   PLAN: OT FREQUENCY: 2x/week  OT DURATION: 8 weeks  PLANNED INTERVENTIONS: self care/ADL training, therapeutic exercise, therapeutic activity, neuromuscular re-education, manual therapy, functional mobility training, aquatic therapy, electrical stimulation, ultrasound, biofeedback, moist heat, cryotherapy, patient/family education, energy conservation, and DME and/or AE instructions  RECOMMENDED OTHER SERVICES: To receive PT/ST services at this location  CONSULTED AND AGREED WITH PLAN OF CARE: Patient and family member/caregiver  PLAN FOR NEXT SESSION:   GMC/FMC activities for LUE.  Seated vs standing weight-bearing, closed-chain exercise, coordination activities, etc.  Functional in nature, if possible, to carry over to pt desire to play guitar again.   Simonne Come, OTR/L 06/04/2022, 3:42 PM

## 2022-06-04 NOTE — Therapy (Signed)
OUTPATIENT PHYSICAL THERAPY TREATMENT NOTE   Patient Name: Vernon [name] MRN: [id number] DOB:01/28/1946, 76 y.o., male Today's Date: 06/04/2022  PCP: Baxter Hire, MD  REFERRING PROVIDER: Baxter Hire, MD   END OF SESSION:   PT End of Session - 06/04/22 1749     Visit Number 8    Number of Visits 16    Date for PT Re-Evaluation 07/03/22    Authorization Type UHC Medicare    Progress Note Due on Visit 10    PT Start Time 1619    PT Stop Time 1700    PT Time Calculation (min) 41 min    Equipment Utilized During Treatment Gait belt    Activity Tolerance Patient tolerated treatment well    Behavior During Therapy WFL for tasks assessed/performed               Past Medical History:  Diagnosis Date   Anemia    Aortic stenosis    Arthritis    Complication of anesthesia    hard time getting bp up after knee replacement   Diabetes mellitus without complication (HCC)    GERD (gastroesophageal reflux disease)    occ tums prn   History of hiatal hernia    Hypertension    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[place])    Past Surgical History:  Procedure Laterality Date   CARPAL TUNNEL RELEASE  2012   CRANIOTOMY N/A 02/10/2022   Procedure: SUBOCCIPITAL CRANIECTOMY FOR EVACUATION OF CEREBELLAR HEMATOMA;  Surgeon: Kristeen Miss, MD;  Location: [facility];  Service: Neurosurgery;  Laterality: N/A;   JOINT REPLACEMENT Right 2010   SHOULDER ARTHROSCOPY WITH ROTATOR CUFF REPAIR AND OPEN BICEPS TENODESIS Right 11/09/2019   Procedure: RIGHT SHOULDER ARTHROSCOPY WITH SUBSCAPULARIS REPAIR, SUBACROMIAL DECOMPRESSION,MINI OPEN ROTATOR CUFF REPAIR;  Surgeon: Leim Fabry, MD;  Location: [facility];  Service: Orthopedics;  Laterality: Right;   Patient Active Problem List   Diagnosis Date Noted   Long term current use of anticoagulant 04/04/2022   Rotator cuff syndrome 04/04/2022   Exposure to potentially hazardous substance 04/04/2022   Gout 04/04/2022   Osteoarthritis 04/04/2022    Osteoporosis 04/04/2022   Other specified diseases of hair and hair follicles 71/24/5809   Pain in joint, lower leg 04/04/2022   Problem related to unspecified psychosocial circumstances 04/04/2022   General medical examination for administrative purposes 04/04/2022   Stage 3b chronic kidney disease ([place])    Dysphagia, post-stroke    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 hemorrhage of brain ([place]) 02/26/2022   Cough 98/33/8250   Acute metabolic encephalopathy 53/97/6734   Obstructive hydrocephalus ([place]) 02/19/2022   Dyslipidemia 02/19/2022   Dysphagia 02/19/2022   AKI (acute kidney injury) ([place])    Hyperkalemia    Anemia    Pleural effusion on right    Respiratory failure requiring intubation (HCC)    Cerebral edema (HCC)    Chronic atrial fibrillation ([place])    Controlled type 2 diabetes mellitus with hyperglycemia, without long-term current use of insulin (HCC)    ICH (intracerebral hemorrhage) ([place]) 02/10/2022   Intracranial hemorrhage (HCC)    Anticoagulated    Moderate tricuspid regurgitation 07/26/2021   Moderate aortic valve stenosis 03/08/2020   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[place]) 02/18/2020   Macrocytic anemia 01/13/2020   Spleen enlargement 01/13/2020   Bilateral carotid artery stenosis 07/14/2018   Atrial fibrillation, chronic ([place]) 07/14/2018   Type 2 diabetes with nephropathy ([place]) 02/14/2016   Essential hypertension 08/16/2014   Hyperlipemia, mixed 08/16/2014   Renal insufficiency 08/16/2014    REFERRING  DIAG: I62.9 (ICD-10-CM) - Nontraumatic intracranial hemorrhage, unspecified   THERAPY DIAG:  Unsteadiness on feet  Other abnormalities of gait and mobility  Muscle weakness (generalized)  Rationale for Evaluation and Treatment Rehabilitation  PERTINENT HISTORY: see MAR  PRECAUTIONS: fall risk  SUBJECTIVE: Brought in the rollator walker today to see if it is better for me.  Use the rollator sometimes at home.  No c/o pain today.    PAIN:  Are you having pain?  No  OBJECTIVE:    TODAY'S TREATMENT: 06/04/2022 Activity Comments  Gait training with RW x 85 ft, then 30 ft x 2   Gait training with rollator x 85 ft, with increased lateral instability, veering of walker towards left side, increased ataxic gait pattern increased forward lean with rollator; discussed that rollator does not seem to be safe option at this time, as it seems to increase ataxia with gait; to continue using RW  Sit<>stand x 5 reps, then sit<>stand with LLE tucked posteriorly for improved LLE weightbearing 2 x 5 reps On last set, used green theraband resisted at thighs, decreased instability noted  In parallel bars:   Resisted sidestep R and L, 10 reps Minisquats to upright stand, 2 x 5 reps LLE as stance with RLE step taps 2 x 10 reps, RUE support only LLE forward step ups to step, x 10 reps Cues for increased control LLE; cues between sets to relax through shoulders and lighten UE support  End of session, pt c/o fatigue in low back:  seated forward flexion>upright posture x 5 reps       -------------------------------------------------------------------------------------------------------------------------   From eval  DIAGNOSTIC FINDINGS: see MAR   COGNITION: Overall cognitive status: Within functional limits for tasks assessed             SENSATION: WFL   COORDINATION: Bradykinesia, difficulty wit rapid alternating movements     MUSCLE TONE: WFL       POSTURE: No Significant postural limitations   LOWER EXTREMITY ROM:      WFL  (Blank rows = not tested)   LOWER EXTREMITY MMT:     MMT Right Eval Left Eval  Hip flexion 5 4  Hip extension 5 3+  Hip abduction 5 3-  Hip adduction      Hip internal rotation      Hip external rotation      Knee flexion 5 4  Knee extension 5 4  Ankle dorsiflexion 5 4+  Ankle plantarflexion      Ankle inversion      Ankle eversion      (Blank rows = not tested)   BED MOBILITY:  Independent   TRANSFERS: Assistive  device utilized: Environmental consultant - 2 wheeled  Sit to stand: Modified independence Stand to sit: Modified independence Chair to chair: SBA and CGA Floor:  DNT   RAMP:  Not available   CURB:  Level of Assistance: SBA and CGA Assistive device utilized: Environmental consultant - 2 wheeled Curb Comments: undershoots with LLE   STAIRS:           Level of Assistance: SBA and CGA           Stair Negotiation Technique: Alternating Pattern  with Bilateral Rails           Number of Stairs: 6             Height of Stairs: 4-6"            Comments: dysmetria w/ LLE   GAIT: Gait pattern:  circumduction- Left and ataxic Distance walked: 100 Assistive device utilized: Walker - 2 wheeled Level of assistance: SBA Comments: dysmetria LLE   FUNCTIONAL TESTs:  Timed up and go (TUG): 20.25 with RW Berg Balance Scale: 35/56      M-CTSIB  Condition 1: Firm Surface, EO 30 Sec, Mild Sway  Condition 2: Firm Surface, EC 30 Sec, Mild and Moderate Sway  Condition 3: Foam Surface, EO 9 Sec, Severe Sway  Condition 4: Foam Surface, EC 0 Sec,  unable  Sway    PATIENT SURVEYS:  FOTO 54.43% functional status   TODAY'S TREATMENT:  Evaluation, sidestepping along counter, tandem stance at counter     PATIENT EDUCATION: Education details: scope of PT intervention Person educated: Patient and Spouse Education method: Explanation Education comprehension: verbalized understanding     HOME EXERCISE PROGRAM: sidestepping along counter, tandem stance at counter   -----------------------------------------------------------------------------------------------------------------------    GOALS: Goals reviewed with patient? Yes   SHORT TERM GOALS: Target date: 05/29/2022   Patient will perform HEP with family/caregiver supervision for improved strength, balance, transfers, and gait  Baseline: Goal status: INITIAL   2.  Patient will achieve 15 seconds for TUG test to manifest reduced risk for falls Baseline: 20.25 with RW Goal  status: INITIAL   3. Demo modified independent gait level surfaces and curb negotiation            Baseline: CGA-SBA with RW            Goal status: INITIAL   LONG TERM GOALS: Target date: 06/26/2022   Patient will demonstrate score 45/56 Berg Balance Test to manifest low risk for falls Baseline: 35/56 Goal status: INITIAL   2.  Demonstrate improved static balance and postural control per time of 15 sec condition 4 M-CTSIB to improve safety with mobility on uneven surfaces Baseline: complete LOB left with condition 3 Goal status: INITIAL   3.  Demo modified independent gait using least restrictive AD over various surfaces and ascend/descend stairs with set-up assist to improve functional mobility Baseline:  Goal status: INITIAL   4.  Demo score of 57% functional status FOTO  Baseline: 54.43% Goal status: INITIAL       ASSESSMENT:   CLINICAL IMPRESSION: Pt brings in rollator to try today from home.  Compared gait pattern with 4-wheeled rollator and rolling walker.  Pt demo decreased control through LUE and increased veering/ataxic gait and getting walker too far in front of him with rollator.  Discussed that his rolling walker would cotninue to be best option at this time.  With resisted theraband and with LLE in stance for weightbearing, he demo improved control and balance; however, he needs cues throughout session to relax through shoulders and LUE and decrease UE reliance and forward posture.   OBJECTIVE IMPAIRMENTS Abnormal gait, decreased activity tolerance, decreased balance, decreased coordination, decreased endurance, decreased knowledge of use of DME, difficulty walking, decreased strength, impaired perceived functional ability, and improper body mechanics.    ACTIVITY LIMITATIONS carrying, lifting, bending, standing, squatting, stairs, transfers, and locomotion level   PARTICIPATION LIMITATIONS: meal prep, cleaning, laundry, driving, shopping, community activity, and yard  work   PERSONAL FACTORS Age, Fitness, and Time since onset of injury/illness/exacerbation are also affecting patient's functional outcome.    REHAB POTENTIAL: Excellent   CLINICAL DECISION MAKING: Evolving/moderate complexity   EVALUATION COMPLEXITY: Moderate   PLAN: PT FREQUENCY: 1-2x/week   PT DURATION: 9 weeks   PLANNED INTERVENTIONS: Therapeutic exercises, Therapeutic activity, Neuromuscular re-education, Balance training, Gait training, Patient/Family  education, Engineer, manufacturing, IT trainer, Vestibular training, Canalith repositioning, DME instructions, Aquatic Therapy, Electrical stimulation, Wheelchair mobility training, and Manual therapy   PLAN FOR NEXT SESSION: Check STGs.  Continue with static balance in corner for safety; continue to review safety with functional transfers                  Mady Haagensen, PT 06/04/22 5:57 PM Phone: [phone] Fax: [place] [facility] [address] [place], [place] [address] Phone # [phone] Fax # [phone]

## 2022-06-06 ENCOUNTER — Encounter: Payer: Self-pay | Admitting: Speech Pathology

## 2022-06-06 ENCOUNTER — Ambulatory Visit: Payer: Medicare Other | Admitting: Speech Pathology

## 2022-06-06 ENCOUNTER — Ambulatory Visit: Payer: Medicare Other

## 2022-06-06 ENCOUNTER — Ambulatory Visit: Payer: Medicare Other | Admitting: Occupational Therapy

## 2022-06-06 DIAGNOSIS — R2689 Other abnormalities of gait and mobility: Secondary | ICD-10-CM

## 2022-06-06 DIAGNOSIS — R2681 Unsteadiness on feet: Secondary | ICD-10-CM

## 2022-06-06 DIAGNOSIS — I69254 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: Secondary | ICD-10-CM | POA: Diagnosis not present

## 2022-06-06 DIAGNOSIS — R29818 Other symptoms and signs involving the nervous system: Secondary | ICD-10-CM

## 2022-06-06 DIAGNOSIS — R262 Difficulty in walking, not elsewhere classified: Secondary | ICD-10-CM

## 2022-06-06 DIAGNOSIS — R278 Other lack of coordination: Secondary | ICD-10-CM

## 2022-06-06 DIAGNOSIS — R471 Dysarthria and anarthria: Secondary | ICD-10-CM

## 2022-06-06 DIAGNOSIS — M6281 Muscle weakness (generalized): Secondary | ICD-10-CM

## 2022-06-06 NOTE — Therapy (Signed)
OUTPATIENT OCCUPATIONAL THERAPY Treatment Note  Patient Name: Vernon [name] MRN: [id number] DOB:1946/02/07, 76 y.o., male Today's Date: 06/06/2022  PCP: Baxter Hire, MD REFERRING PROVIDER: Baxter Hire, MD   OT End of Session - 06/06/22 1547     Visit Number 8    Number of Visits 17    Date for OT Re-Evaluation 07/06/22    Authorization Type UHC Medicare    Authorization Time Period VL: [place]    OT Start Time 1537    OT Stop Time 1618    OT Time Calculation (min) 41 min    Activity Tolerance Patient tolerated treatment well    Behavior During Therapy WFL for tasks assessed/performed            Past Medical History:  Diagnosis Date   Anemia    Aortic stenosis    Arthritis    Complication of anesthesia    hard time getting bp up after knee replacement   Diabetes mellitus without complication (HCC)    GERD (gastroesophageal reflux disease)    occ tums prn   History of hiatal hernia    Hypertension    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[place])    Past Surgical History:  Procedure Laterality Date   CARPAL TUNNEL RELEASE  2012   CRANIOTOMY N/A 02/10/2022   Procedure: SUBOCCIPITAL CRANIECTOMY FOR EVACUATION OF CEREBELLAR HEMATOMA;  Surgeon: Kristeen Miss, MD;  Location: [facility];  Service: Neurosurgery;  Laterality: N/A;   JOINT REPLACEMENT Right 2010   SHOULDER ARTHROSCOPY WITH ROTATOR CUFF REPAIR AND OPEN BICEPS TENODESIS Right 11/09/2019   Procedure: RIGHT SHOULDER ARTHROSCOPY WITH SUBSCAPULARIS REPAIR, SUBACROMIAL DECOMPRESSION,MINI OPEN ROTATOR CUFF REPAIR;  Surgeon: Leim Fabry, MD;  Location: [facility];  Service: Orthopedics;  Laterality: Right;   Patient Active Problem List   Diagnosis Date Noted   Long term current use of anticoagulant 04/04/2022   Rotator cuff syndrome 04/04/2022   Exposure to potentially hazardous substance 04/04/2022   Gout 04/04/2022   Osteoarthritis 04/04/2022   Osteoporosis 04/04/2022   Other specified diseases of hair and hair follicles  40/98/1191   Pain in joint, lower leg 04/04/2022   Problem related to unspecified psychosocial circumstances 04/04/2022   General medical examination for administrative purposes 04/04/2022   Stage 3b chronic kidney disease ([place])    Dysphagia, post-stroke    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 hemorrhage of brain ([place]) 02/26/2022   Cough 47/82/9562   Acute metabolic encephalopathy 13/06/6577   Obstructive hydrocephalus ([place]) 02/19/2022   Dyslipidemia 02/19/2022   Dysphagia 02/19/2022   AKI (acute kidney injury) ([place])    Hyperkalemia    Anemia    Pleural effusion on right    Respiratory failure requiring intubation (HCC)    Cerebral edema (HCC)    Chronic atrial fibrillation ([place])    Controlled type 2 diabetes mellitus with hyperglycemia, without long-term current use of insulin (HCC)    ICH (intracerebral hemorrhage) ([place]) 02/10/2022   Intracranial hemorrhage (HCC)    Anticoagulated    Moderate tricuspid regurgitation 07/26/2021   Moderate aortic valve stenosis 03/08/2020   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[place]) 02/18/2020   Macrocytic anemia 01/13/2020   Spleen enlargement 01/13/2020   Bilateral carotid artery stenosis 07/14/2018   Atrial fibrillation, chronic ([place]) 07/14/2018   Type 2 diabetes with nephropathy ([place]) 02/14/2016   Essential hypertension 08/16/2014   Hyperlipemia, mixed 08/16/2014   Renal insufficiency 08/16/2014    ONSET DATE: 02/10/2022  REFERRING DIAG: I62.9 (ICD-10-CM) - Nontraumatic intracranial hemorrhage, unspecified  THERAPY DIAG:  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 Ellene Route on 02/11/22 w/ EVD pulled 02/14/22. PMH includes DMT2, HTN, HLD, atrial fibrillation on Eliquis, arthritis, anemia, and GERD  PAIN: Are you having pain? No  PATIENT GOALS: Be independent again; play guitar  OBJECTIVE:   TODAY'S TREATMENT:  Alternating weight shifting to each side and weight bearing through ipsilateral RUE/LUE w/ disengaging of contralateral RUE/LUE for cross-body reaching to retrieve therapy cones from low level and stack on mat surface; completed 2x5 each while sitting unsupported on EOM. Increased LUE ataxia observed w/ increased distance of reach. Min verbal cues for slow, controlled movements. BUE weight bearing in modified pushup position using EOM; OT provided close CGA throughout w/ RW positioned in front of pt for UE assist w/ return to upright position and for support during rest break. While in wb position, pt retrieved and stacked therapy cones using RUE for increased somatosensory input through affected LUE. No difficulty observed maintaining stability. Simulated cutting food (yellow therapy putty) w/ a knife and fork to assess participation, positioning, and safety during activity. OT discussed pt-specific adaptations, including focusing on use of LUE as a stabilizer and potential benefit of AE including rocker knife or non-slip mat when tough or slick foods for safety. Pt able to return demonstration of adaptive technique w/ min difficulty, requiring extended time for success Fastening shirt buttons w/ pt demonstrating significant difficulty due to LUE ataxia and visual perception. OT provided occ demonstration and verbal cues for pacing and compensatory techniques. Will continue to assess participation and introduce potentially beneficial AE prn.    PATIENT EDUCATION: Ongoing condition-specific education, particularly regarding neuro reed and typical recovery patterns as well as  benefit and goal of weight bearing/increased somatosensory input through affected UE Person educated: Patient Education method: Explanation Education comprehension: verbalized understanding   HOME EXERCISE PROGRAM: Coordination handout - see pt instructions  Access Code: [email or web address] URL: [email or web address] Date: 06/04/2022 Prepared by: [facility]  Exercises - Seated Shoulder Flexion with Dowel to 90  - 4 x weekly - 2-3 sets - 10 reps - Seated Chest Press with Bar  - 4 x weekly - 2-3 sets - 10 reps - Single Arm Shoulder Flexion with Dumbbell  - 4 x weekly - 2-3 sets - 10 reps - Seated Shoulder External Rotation with Dumbbell  - 4 x weekly - 2-3 sets - 10 reps   GOALS: Goals reviewed with patient? Yes  SHORT TERM GOALS: Target date: 06/08/22  STG  Status:  1 Pt will demonstrate independence w/ initial HEP designed for LUE GM and [place] Baseline: No HEP at this time Progressing  2 Pt will improve participation in functional bilateral FM tasks as evidenced by decreasing time to complete 9-Hole Peg Test w/ LUE by at least 6 sec Baseline: Right: 21.8  sec; Left: 51.2 sec Progressing - 06/04/22 51.66 sec  3 Pt will be able to complete simulated bilateral functional task (e.g. cutting food, manipulating clothing fasteners, washing dishes) within reasonable amount of time Baseline: Difficulty w/ bilateral tasks Progressing     LONG TERM GOALS: Target date: 07/06/22  LTG  Status:  1 Pt will improve participation in functional activities as evidenced by increasing FOTO score to 71 by d/c Baseline: 62 Progressing  2 Pt will demonstrate improved control and accuracy w/ LUE by completing 8 taps within 10 seconds during finger to nose test Baseline: Right: 12 taps in 10 sec; Left: 6 taps in 10 sec Progressing  3 Pt will improve Box and Blocks score to at least 40 blocks by d/c to indicate improved unilateral GMC of LUE Baseline: Right 61  blocks, Left 29 blocks Progressing  4 Pt will safely demonstrate simulated grilling activity, incorporating compensatory strategies/AE prn, w/ Mod I by d/c Baseline: Unable to complete at this time Progressing     ASSESSMENT:  CLINICAL IMPRESSION: Engaged in weight bearing alternating UEs and BUEs for increased proprioceptive input and somatosensory feedback for NMR and decreased impact of ataxia. Completed as preparatory exercise prior to functional activities w/ OT providing corresponding education and answering pt questions as able. OT increased challenge by incorporating disengaging of contralateral UE for increased input during tasks w/ good results. Pt continues to demonstrate ataxia with both gross and fine motor tasks, benefiting from verbal cues for pacing, as decreased speed causes increased accuracy and success. OT also encouraged pt to bring in video of him attempting guitar to assist w/ OT developing compensatory methods, modifications, or adaptive strategies prn.  PERFORMANCE DEFICITS in functional skills including ADLs, IADLs, coordination, dexterity, sensation, strength, FMC, GMC, mobility, balance, body mechanics, and UE functional use.  IMPAIRMENTS are limiting patient from ADLs, IADLs, and leisure.   COMORBIDITIES has co-morbidities such as atrial fibrillation on Eliquis and DMT2  that affects occupational performance. Patient will benefit from skilled OT to address above impairments and improve overall function.  MODIFICATION OR ASSISTANCE TO COMPLETE EVALUATION: Min-Moderate modification of tasks or assist with assess necessary to complete an evaluation.  OT OCCUPATIONAL PROFILE AND HISTORY: Detailed assessment: Review of records and additional review of physical, cognitive, psychosocial history related to current functional performance.  CLINICAL DECISION MAKING: Moderate - several treatment options, min-mod task modification necessary  REHAB POTENTIAL: Good  EVALUATION  COMPLEXITY: Moderate   PLAN: OT FREQUENCY: 2x/week  OT DURATION: 8 weeks  PLANNED INTERVENTIONS: self care/ADL training, therapeutic exercise, therapeutic activity, neuromuscular re-education, manual therapy, functional mobility training, aquatic therapy, electrical stimulation, ultrasound, biofeedback, moist heat, cryotherapy, patient/family education, energy conservation, and DME and/or AE instructions  RECOMMENDED OTHER SERVICES: To receive PT/ST services at this location  CONSULTED AND AGREED WITH PLAN OF CARE: Patient and family member/caregiver  PLAN FOR NEXT SESSION:   GMC/FMC activities for LUE. Seated vs standing weight-bearing, closed-chain exercise, coordination activities, etc.  Functional in nature, if possible, to carry over to pt desire to play guitar again.   Kathrine Cords, OTR/L 06/06/2022, 4:18 PM

## 2022-06-06 NOTE — Therapy (Signed)
OUTPATIENT PHYSICAL THERAPY TREATMENT NOTE and Progress Note   Patient Name: Vernon [name] MRN: [id number] DOB:06/14/1946, 76 y.o., male Today's Date: 06/06/2022  PCP: Baxter Hire, MD  REFERRING PROVIDER: Baxter Hire, MD  Progress Note Reporting Period 05/01/22 to 06/06/22  See note below for Objective Data and Assessment of Progress/Goals.     END OF SESSION:   PT End of Session - 06/06/22 1455     Visit Number 9    Number of Visits 16    Date for PT Re-Evaluation 07/03/22    Authorization Type UHC Medicare    Progress Note Due on Visit 4    PT Start Time 1445    PT Stop Time 1530    PT Time Calculation (min) 45 min    Equipment Utilized During Treatment Gait belt    Activity Tolerance Patient tolerated treatment well    Behavior During Therapy WFL for tasks assessed/performed               Past Medical History:  Diagnosis Date   Anemia    Aortic stenosis    Arthritis    Complication of anesthesia    hard time getting bp up after knee replacement   Diabetes mellitus without complication (HCC)    GERD (gastroesophageal reflux disease)    occ tums prn   History of hiatal hernia    Hypertension    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[place])    Past Surgical History:  Procedure Laterality Date   CARPAL TUNNEL RELEASE  2012   CRANIOTOMY N/A 02/10/2022   Procedure: SUBOCCIPITAL CRANIECTOMY FOR EVACUATION OF CEREBELLAR HEMATOMA;  Surgeon: Kristeen Miss, MD;  Location: [facility];  Service: Neurosurgery;  Laterality: N/A;   JOINT REPLACEMENT Right 2010   SHOULDER ARTHROSCOPY WITH ROTATOR CUFF REPAIR AND OPEN BICEPS TENODESIS Right 11/09/2019   Procedure: RIGHT SHOULDER ARTHROSCOPY WITH SUBSCAPULARIS REPAIR, SUBACROMIAL DECOMPRESSION,MINI OPEN ROTATOR CUFF REPAIR;  Surgeon: Leim Fabry, MD;  Location: [facility];  Service: Orthopedics;  Laterality: Right;   Patient Active Problem List   Diagnosis Date Noted   Long term current use of anticoagulant 04/04/2022    Rotator cuff syndrome 04/04/2022   Exposure to potentially hazardous substance 04/04/2022   Gout 04/04/2022   Osteoarthritis 04/04/2022   Osteoporosis 04/04/2022   Other specified diseases of hair and hair follicles 91/63/8466   Pain in joint, lower leg 04/04/2022   Problem related to unspecified psychosocial circumstances 04/04/2022   General medical examination for administrative purposes 04/04/2022   Stage 3b chronic kidney disease ([place])    Dysphagia, post-stroke    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 hemorrhage of brain ([place]) 02/26/2022   Cough 59/93/5701   Acute metabolic encephalopathy 77/93/9030   Obstructive hydrocephalus ([place]) 02/19/2022   Dyslipidemia 02/19/2022   Dysphagia 02/19/2022   AKI (acute kidney injury) ([place])    Hyperkalemia    Anemia    Pleural effusion on right    Respiratory failure requiring intubation (HCC)    Cerebral edema (HCC)    Chronic atrial fibrillation ([place])    Controlled type 2 diabetes mellitus with hyperglycemia, without long-term current use of insulin (HCC)    ICH (intracerebral hemorrhage) ([place]) 02/10/2022   Intracranial hemorrhage (HCC)    Anticoagulated    Moderate tricuspid regurgitation 07/26/2021   Moderate aortic valve stenosis 03/08/2020   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[place]) 02/18/2020   Macrocytic anemia 01/13/2020   Spleen enlargement 01/13/2020   Bilateral carotid artery stenosis 07/14/2018   Atrial fibrillation, chronic ([place]) 07/14/2018   Type 2  diabetes with nephropathy ([place]) 02/14/2016   Essential hypertension 08/16/2014   Hyperlipemia, mixed 08/16/2014   Renal insufficiency 08/16/2014    REFERRING DIAG: I62.9 (ICD-10-CM) - Nontraumatic intracranial hemorrhage, unspecified   THERAPY DIAG:  Unsteadiness on feet  Other abnormalities of gait and mobility  Muscle weakness (generalized)  Difficulty in walking, not elsewhere classified  Rationale for Evaluation and Treatment Rehabilitation  PERTINENT HISTORY: see MAR  PRECAUTIONS:  fall risk  SUBJECTIVE: Feeling better with joint issues today.    PAIN:  Are you having pain? No  OBJECTIVE:   TODAY'S TREATMENT: 06/06/22 Activity Comments  Gait outside with RW CGA and curb negotiation with CGA  Berg Balance Test 37/56 indicating high risk for falls  TUG test 14.09 sec with poor safety awareness and fall into chair  Weight shifting in front of mirror Forward, backward, lateral, rotations--left LOB with loading/bias  Gait on level surfaces with RW CGA Emphasis on negotiating turns, obstacles, and environmental scanning  Standing on foam EO/EC.  Unable to maintain EC with LOB, EO 5-10 sec before LOB     TODAY'S TREATMENT: 06/04/2022 Activity Comments  Gait training with RW x 85 ft, then 30 ft x 2   Gait training with rollator x 85 ft, with increased lateral instability, veering of walker towards left side, increased ataxic gait pattern increased forward lean with rollator; discussed that rollator does not seem to be safe option at this time, as it seems to increase ataxia with gait; to continue using RW  Sit<>stand x 5 reps, then sit<>stand with LLE tucked posteriorly for improved LLE weightbearing 2 x 5 reps On last set, used green theraband resisted at thighs, decreased instability noted  In parallel bars:   Resisted sidestep R and L, 10 reps Minisquats to upright stand, 2 x 5 reps LLE as stance with RLE step taps 2 x 10 reps, RUE support only LLE forward step ups to step, x 10 reps Cues for increased control LLE; cues between sets to relax through shoulders and lighten UE support  End of session, pt c/o fatigue in low back:  seated forward flexion>upright posture x 5 reps       -------------------------------------------------------------------------------------------------------------------------   From eval  DIAGNOSTIC FINDINGS: see MAR   COGNITION: Overall cognitive status: Within functional limits for tasks assessed             SENSATION: WFL    COORDINATION: Bradykinesia, difficulty wit rapid alternating movements     MUSCLE TONE: WFL       POSTURE: No Significant postural limitations   LOWER EXTREMITY ROM:      WFL  (Blank rows = not tested)   LOWER EXTREMITY MMT:     MMT Right Eval Left Eval  Hip flexion 5 4  Hip extension 5 3+  Hip abduction 5 3-  Hip adduction      Hip internal rotation      Hip external rotation      Knee flexion 5 4  Knee extension 5 4  Ankle dorsiflexion 5 4+  Ankle plantarflexion      Ankle inversion      Ankle eversion      (Blank rows = not tested)   BED MOBILITY:  Independent   TRANSFERS: Assistive device utilized: Environmental consultant - 2 wheeled  Sit to stand: Modified independence Stand to sit: Modified independence Chair to chair: SBA and CGA Floor:  DNT   RAMP:  Not available   CURB:  Level of Assistance: SBA and CGA  Assistive device utilized: Environmental consultant - 2 wheeled Curb Comments: undershoots with LLE   STAIRS:           Level of Assistance: SBA and CGA           Stair Negotiation Technique: Alternating Pattern  with Bilateral Rails           Number of Stairs: 6             Height of Stairs: 4-6"            Comments: dysmetria w/ LLE   GAIT: Gait pattern: circumduction- Left and ataxic Distance walked: 100 Assistive device utilized: Walker - 2 wheeled Level of assistance: SBA Comments: dysmetria LLE   FUNCTIONAL TESTs:  Timed up and go (TUG): 20.25 with RW Berg Balance Scale: 35/56      M-CTSIB  Condition 1: Firm Surface, EO 30 Sec, Mild Sway  Condition 2: Firm Surface, EC 30 Sec, Mild and Moderate Sway  Condition 3: Foam Surface, EO 9 Sec, Severe Sway  Condition 4: Foam Surface, EC 0 Sec,  unable  Sway    PATIENT SURVEYS:  FOTO 54.43% functional status   TODAY'S TREATMENT:  Evaluation, sidestepping along counter, tandem stance at counter     PATIENT EDUCATION: Education details: scope of PT intervention Person educated: Patient and Spouse Education  method: Explanation Education comprehension: verbalized understanding     HOME EXERCISE PROGRAM: sidestepping along counter, tandem stance at counter   -----------------------------------------------------------------------------------------------------------------------    GOALS: Goals reviewed with patient? Yes   SHORT TERM GOALS: Target date: 05/29/2022   Patient will perform HEP with family/caregiver supervision for improved strength, balance, transfers, and gait  Baseline: Goal status: MET   2.  Patient will achieve 15 seconds for TUG test to manifest reduced risk for falls Baseline: 20.25 with RW; 14 sec with RW Goal status: MET   3. Demo modified independent gait level surfaces and curb negotiation            Baseline: CGA-SBA with RW            Goal status: On-going   LONG TERM GOALS: Target date: 06/26/2022   Patient will demonstrate score 45/56 Berg Balance Test to manifest low risk for falls Baseline: 35/56; 37/56 on 06/06/22 Goal status: On-going   2.  Demonstrate improved static balance and postural control per time of 15 sec condition 4 M-CTSIB to improve safety with mobility on uneven surfaces Baseline: complete LOB left with condition 3 Goal status: On-going   3.  Demo modified independent gait using least restrictive AD over various surfaces and ascend/descend stairs with set-up assist to improve functional mobility Baseline:  Goal status: on-going   4.  Demo score of 57% functional status FOTO  Baseline: 54.43% Goal status: INITIAL       ASSESSMENT:   CLINICAL IMPRESSION: Performance of progress note today with meeting 2/3 STG and progressing with remaining POC details and demo 2-point improvement in Berg Balance Test from baseline score.  Continues to demonstrate left side deficits with postural instability and ipsilateral LOB with perturbations and uneven surfaces biasing the left requiring physical support to correct and prevent fall.  Progressing with  ambulation abilities demonstrating greater safety with ambulation on level indoor/outdoor surfaces and curb negotiation, albeit performance of TUG test demonstrates improved time of test but poor safety awareness exhibited with sitting to seat performing more or less a controlled fall to chair with main deficits exhibited during turning. Continued sessions to enhance motor  control, balance, strength, coordination, and reduce risk for falls.   OBJECTIVE IMPAIRMENTS Abnormal gait, decreased activity tolerance, decreased balance, decreased coordination, decreased endurance, decreased knowledge of use of DME, difficulty walking, decreased strength, impaired perceived functional ability, and improper body mechanics.    ACTIVITY LIMITATIONS carrying, lifting, bending, standing, squatting, stairs, transfers, and locomotion level   PARTICIPATION LIMITATIONS: meal prep, cleaning, laundry, driving, shopping, community activity, and yard work   PERSONAL FACTORS Age, Fitness, and Time since onset of injury/illness/exacerbation are also affecting patient's functional outcome.    REHAB POTENTIAL: Excellent   CLINICAL DECISION MAKING: Evolving/moderate complexity   EVALUATION COMPLEXITY: Moderate   PLAN: PT FREQUENCY: 1-2x/week   PT DURATION: 9 weeks   PLANNED INTERVENTIONS: Therapeutic exercises, Therapeutic activity, Neuromuscular re-education, Balance training, Gait training, Patient/Family education, Joint mobilization, Stair training, Vestibular training, Canalith repositioning, DME instructions, Aquatic Therapy, Electrical stimulation, Wheelchair mobility training, and Manual therapy   PLAN FOR NEXT SESSION:  Continue with static balance in corner for safety; continue to review safety with functional transfers                  4:52 PM, 06/06/22 M. Sherlyn Lees, PT, DPT Physical Therapist- [place] Office Number: [phone]   [place] [address] [place], [place] [address] Phone # [phone] Fax # [phone]

## 2022-06-06 NOTE — Therapy (Signed)
OUTPATIENT SPEECH LANGUAGE PATHOLOGY TREATMENT NOTE   Patient Name: Vernon [name] MRN: [id number] DOB:Jul 22, 1946, 76 y.o., male Today's Date: 06/06/2022  PCP: Baxter Hire, MD  REFERRING PROVIDER: Baxter Hire, MD   END OF SESSION:   End of Session - 06/06/22 1621     Visit Number 7    Number of Visits 25    Date for SLP Re-Evaluation 07/26/22    Authorization Type U[employer]    SLP Start Time 1617    SLP Stop Time  1655    SLP Time Calculation (min) 38 min    Activity Tolerance Patient tolerated treatment well                Past Medical History:  Diagnosis Date   Anemia    Aortic stenosis    Arthritis    Complication of anesthesia    hard time getting bp up after knee replacement   Diabetes mellitus without complication (HCC)    GERD (gastroesophageal reflux disease)    occ tums prn   History of hiatal hernia    Hypertension    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[place])    Past Surgical History:  Procedure Laterality Date   CARPAL TUNNEL RELEASE  2012   CRANIOTOMY N/A 02/10/2022   Procedure: SUBOCCIPITAL CRANIECTOMY FOR EVACUATION OF CEREBELLAR HEMATOMA;  Surgeon: Kristeen Miss, MD;  Location: [facility];  Service: Neurosurgery;  Laterality: N/A;   JOINT REPLACEMENT Right 2010   SHOULDER ARTHROSCOPY WITH ROTATOR CUFF REPAIR AND OPEN BICEPS TENODESIS Right 11/09/2019   Procedure: RIGHT SHOULDER ARTHROSCOPY WITH SUBSCAPULARIS REPAIR, SUBACROMIAL DECOMPRESSION,MINI OPEN ROTATOR CUFF REPAIR;  Surgeon: Leim Fabry, MD;  Location: [facility];  Service: Orthopedics;  Laterality: Right;   Patient Active Problem List   Diagnosis Date Noted   Long term current use of anticoagulant 04/04/2022   Rotator cuff syndrome 04/04/2022   Exposure to potentially hazardous substance 04/04/2022   Gout 04/04/2022   Osteoarthritis 04/04/2022   Osteoporosis 04/04/2022   Other specified diseases of hair and hair follicles 85/63/1497   Pain in joint, lower leg 04/04/2022   Problem related  to unspecified psychosocial circumstances 04/04/2022   General medical examination for administrative purposes 04/04/2022   Stage 3b chronic kidney disease ([place])    Dysphagia, post-stroke    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 hemorrhage of brain ([place]) 02/26/2022   Cough 02/63/7858   Acute metabolic encephalopathy 85/12/7739   Obstructive hydrocephalus ([place]) 02/19/2022   Dyslipidemia 02/19/2022   Dysphagia 02/19/2022   AKI (acute kidney injury) ([place])    Hyperkalemia    Anemia    Pleural effusion on right    Respiratory failure requiring intubation (HCC)    Cerebral edema (HCC)    Chronic atrial fibrillation ([place])    Controlled type 2 diabetes mellitus with hyperglycemia, without long-term current use of insulin (HCC)    ICH (intracerebral hemorrhage) ([place]) 02/10/2022   Intracranial hemorrhage (HCC)    Anticoagulated    Moderate tricuspid regurgitation 07/26/2021   Moderate aortic valve stenosis 03/08/2020   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[place]) 02/18/2020   Macrocytic anemia 01/13/2020   Spleen enlargement 01/13/2020   Bilateral carotid artery stenosis 07/14/2018   Atrial fibrillation, chronic ([place]) 07/14/2018   Type 2 diabetes with nephropathy ([place]) 02/14/2016   Essential hypertension 08/16/2014   Hyperlipemia, mixed 08/16/2014   Renal insufficiency 08/16/2014    PERTINENT HISTORY:  March 2023 experienced hemorrhage in cerebellar peduncle and underwent craniotomy for evacuation. Notes his LUE and LLE are most affected at present. Underwent acute hospitalization,  and [facility]. Recently D/C from home health on 04/30/22. Did not receive HHST, only HHPT/OT.     ONSET DATE: 02/10/22   REFERRING DIAG: I62.9 (ICD-10-CM) - Nontraumatic intracranial hemorrhage, unspecified   THERAPY DIAG:  Dysarthria and anarthria  Rationale for Evaluation and Treatment Rehabilitation  SUBJECTIVE: "I notice my left hand is better when I'm slow and deliberate." Pt told SLP details about each of his previous  two therapies today.   PAIN:  Are you having pain? Yes NPRS scale: 1/27, with certain movement Pain location: rt shoulder Pain orientation: Lower  PAIN TYPE: ache Pain description: intermittent  Aggravating factors: certain movement Relieving factors: massage, rest   OBJECTIVE:   TODAY'S TREATMENT:  06/06/22: PT BROUGHT FOLDER TODAY and wanted [name] to know he remembered. Pt reports he has been completing his exercises x2/day and reports others believe he's made improvement. Completed PHoRTE exercises. Required cueing to rduce tight/strained voice, as well as to slow down for adequate breath support. Pt was able to produce "Over the fence" voice sentences with more ease, rating it a 4/10 in effort level required. Pt completed authoritative voice sentences with more difficulty, rating it a 6/10 in effort level required.   Pt felt doing a sigh vs. [place] was easier to complete with less tension. SLP in agreement.   05/31/22: Pt entered with his own folder today; "SEE I TOLD YOU I'D REMEMBER IT!" He had other things that he did each day written on his calendar as well. SLP introduced PHOrTE exercises to pt today (see pt instructions) and worked with him to achieve adequate breath prior to each rep (pt would rush at times and not achieve adequate breath support). SLP provided [name] with detailed instructions and also educated wife, alone in lobby at the time, on each exercise. No throat clearing the last two sessions.  05/28/22: Pt provided very detailed explanation regarding of his nephrology appointment last week, and about his plans for tomorrow with family. He stated next session he will bring his folder, instead of relying on [name]. SLP worked with pt on his voice exercises, requiring min A occasionally for strong voice without squeezing, and min A usually for abdominal breathing. Pt's loud /a/ was produced with WNL voicing 50%.   05/24/22: Semi-occluded vocal tract (SOVTE) exercises completed  today with SLP min A due to pt strained voice. Good airflow with mostly all reps of exercises. Pt stated he has been practicing highlighted exercises BID. Loud "ha" with tremulous vocal quality, but mostly withotut strained voice (following SOVTE exercises). Mid-upper 80s dB average for "ha". In conversation pt used WNL voicing ~50% of the time.  Again, no dysnomic errors today.  6/22/23[name] checked in at 1150. Prior to session with pt and [name] alone in lobby [name] told SLP, "Bastion can sing this week, it's better than last week!" SLP notes some spontaneous WNL voicing today with incr'd diaphragmatic effort. SLP highlighted this to pt and had pt read 5-7 word sentences and WNL quality heard 35% of the time. SLP had pt "do your homework like you've been doing it at home." Pt began with strained loud "ha" x3 reps and SLP stopped pt. SLP explained HEP is a progression of exercises and told pt to start with the first exercise - SLP req'd to cue pt where first exercise was. Semi-occluded vocal tract (SOVTE) exercises completed with SLP assistance necessary for attention skills. SLP used visual cues by highlighting the SOVTE SLP wants pt to focus on until  next session. Using SOVTE prior to loud "ha" pt had much more WNL voicing without vocal strain. Mid-upper 80s dB for "ha" with WNL voicing for first 4 reps and notable fatigue on 2 subsequent reps.  No dysnomia noted today in session. Pt told SLP he had nephrologist appt at 3pm today, and that SLP was only session today however could not locate correct day on calendar without extra time and min A from SLP.       PATIENT EDUCATION: Education details: See "treatment" above Person educated: Patient  Education method: Explanation, Demonstration, Verbal cues, Handouts, and   Education comprehension: verbalized understanding, returned demonstration, verbal cues required, and needs further education         GOALS: Goals reviewed with patient? Yes   SHORT  TERM GOALS: Target date: 05/31/22 Pt will complete HEP for voice/dysarthria with occasional min A over 2 sessions Baseline: 05/24/22 Goal status: Met   2.  Pt will use external aids to recall daily events/schedule with occasional min A from spouse over 1 week Baseline:  Goal status: Deferred   3.  Pt will achieve clear phonation 18/20 sentences in structured task Baseline:  Goal status: Not met   4.  Pt will ID dysnomic errors and self correct with rare min A over 2 sessions Baseline:  Goal status: Deferred     LONG TERM GOALS: Target date: 07/26/22 Pt will achieve clear phonation over 10 minute conversation with rare min A over 2 sessions Baseline:  Goal status: Revised   2.  Pt will independently recall weekly/daily schedule, appointments events and pertinent information with memory aids over 2 sessions Baseline:  Goal status: Deferred   3.  Pt will complete vocal warm ups and follow 3 vocal hygiene strategies to return to singing for 5-10 minute periods with rare min A Baseline:  Goal status: Ongoing   4.  Improve score on Cognitive function PROM by 4 points Baseline: 101- filled out by spouse Goal status: Ongoing   ASSESSMENT:   CLINICAL IMPRESSION: Patient is a 76 y.o. male who is seen for dysarthria/voice and s/p CVA. See tx note. Pt brought book in today. Birl is a Therapist, nutritional and sings for fun and at church. I recommend cont'd skilled ST to maximize voice and cognition for safety, to reduce caregiver burden and QOL.    OBJECTIVE IMPAIRMENTS include attention, memory, awareness, dysarthria, and voice disorder. These impairments are limiting patient from managing appointments, household responsibilities, and effectively communicating at home and in community. Factors affecting potential to achieve goals and functional outcome are ability to learn/carryover information.. Patient will benefit from skilled SLP services to address above impairments and improve overall function.    REHAB POTENTIAL: Good   PLAN: SLP FREQUENCY: 2x/week   SLP DURATION: 12 weeks   PLANNED INTERVENTIONS: Language facilitation, Environmental controls, Cueing hierachy, Cognitive reorganization, Internal/external aids, Functional tasks, Multimodal communication approach, and SLP instruction and feedback         [employer], [place] 06/06/2022, 4:22 PM

## 2022-06-06 NOTE — Patient Instructions (Signed)
Weight Bearing Hand Sit    Place hands flat at sides. Lean body weight toward your left side. Hold 5-10 seconds. Repeat 5 times. **Make sure your shoulder is pressed down and back as much as possible (I.e., don't hike it up)

## 2022-06-07 ENCOUNTER — Ambulatory Visit: Payer: Medicare Other | Admitting: Neurology

## 2022-06-07 ENCOUNTER — Encounter: Payer: Self-pay | Admitting: Neurology

## 2022-06-07 VITALS — BP 136/82 | HR 75 | Ht 69.0 in | Wt 205.0 lb

## 2022-06-07 DIAGNOSIS — I614 Nontraumatic intracerebral hemorrhage in cerebellum: Secondary | ICD-10-CM | POA: Diagnosis not present

## 2022-06-07 DIAGNOSIS — I4821 Permanent atrial fibrillation: Secondary | ICD-10-CM

## 2022-06-07 DIAGNOSIS — I6789 Other cerebrovascular disease: Secondary | ICD-10-CM | POA: Diagnosis not present

## 2022-06-07 DIAGNOSIS — G3184 Mild cognitive impairment, so stated: Secondary | ICD-10-CM

## 2022-06-07 DIAGNOSIS — R270 Ataxia, unspecified: Secondary | ICD-10-CM

## 2022-06-07 NOTE — Patient Instructions (Signed)
I had a long discussion with the patient and his wife regarding his recent cerebral hemorrhage related to anticoagulation with Eliquis for his atrial fibrillation and discussed treatment options including continuing anticoagulation versus referral to [place] device.  I recommend he continue Eliquis for now and referral to Dr. Quentin Ore for [facility] device procedure.  He will continue his ongoing physical and speech therapy and use his walker at all times and we discussed fall safety precautions.  He also has mild cognitive impairment and encouraged him to increase participation in cognitively challenging activities like solving crossword puzzles, playing bridge, word searches and sudoku.  We also discussed memory compensation strategies.  He will return for follow-up in the future in 3 months or call earlier if necessary.     Left Atrial Appendage Closure Device Implantation  Left atrial appendage (LAA) closure device implantation is a procedure to put a small device in the LAA of the heart. The LAA is a small sac in the wall of the heart's left upper chamber. Blood clots can form in the LAA in people with atrial fibrillation (AFib). The device closes the LAA to help prevent a blood clot and stroke. AFib is a type of irregular or rapid heartbeat (arrhythmia). There is an increased risk of blood clots and stroke with AFib. This procedure helps to reduce that risk. Tell a health care provider about: Any allergies you have. All medicines you are taking, including vitamins, herbs, eye drops, creams, and over-the-counter medicines. Any problems you or family members have had with anesthetic medicines. Any blood disorders you have. Any surgeries you have had. Any medical conditions you have. Whether you are pregnant or may be pregnant. What are the risks? Generally, this is a safe procedure. However, problems may occur, including: Infection. Bleeding. Allergic reactions to medicines or dyes. Damage to  nearby structures or organs. Heart attack. Stroke. Blood clots. Changes in heart rhythm. Device failure. What happens before the procedure? Staying hydrated Follow instructions from your health care provider about hydration, which may include: Up to 2 hours before the procedure - you may continue to drink clear liquids, such as water, clear fruit juice, black coffee, and plain tea. Eating and drinking restrictions Follow instructions from your health care provider about eating and drinking, which may include: 8 hours before the procedure - stop eating heavy meals or foods, such as meat, fried foods, or fatty foods. 6 hours before the procedure - stop eating light meals or foods, such as toast or cereal. 6 hours before the procedure - stop drinking milk or drinks that contain milk. 2 hours before the procedure - stop drinking clear liquids. Medicines Ask your health care provider about: Changing or stopping your regular medicines. This is especially important if you are taking diabetes medicines or blood thinners. Taking medicines such as aspirin and ibuprofen. These medicines can thin your blood. Do not take these medicines unless your health care provider tells you to take them. Taking over-the-counter medicines, vitamins, herbs, and supplements. Tests You may have blood tests and a physical exam. You may have an electrocardiogram (ECG). This test checks your heart's electrical patterns and rhythms. General instructions Do not use any products that contain nicotine or tobacco. These include cigarettes, chewing tobacco, and vaping devices, such as e-cigarettes. If you need help quitting, ask your health care provider. Ask your health care provider what steps will be taken to help prevent infection. These steps may include: Removing hair at the surgery site. Washing skin with a  germ-killing soap. Taking antibiotic medicine. Plan to have a responsible adult take you home from the hospital  or clinic. Plan to have a responsible adult care for you for the time you are told after you leave the hospital or clinic. This is important. What happens during the procedure? An IV will be inserted into one of your veins. You will be given one or more of the following: A medicine to help you relax (sedative). A medicine to make you fall asleep (general anesthetic). A small incision will be made in your groin area. A small wire will be put through the incision and into a blood vessel. Dye may be injected so X-rays can be used to guide the wire through the blood vessel. A long, thin tube (catheter) will be put over the small wire and moved up through the blood vessel to reach your heart. The closure device will be moved through the catheter until it reaches your heart. A small hole will be made in the septum (transseptal puncture). The septum is a thin tissue that separates the upper two chambers of the heart. The device will be placed so that it closes the LAA. X-rays will be done to make sure the device is in the right place. The catheter and wire will be removed. The closure device will remain in your heart. After pressure is applied over the catheter site to prevent bleeding, a bandage (dressing) will be placed over the site where the catheter was inserted. The procedure may vary among health care providers and hospitals. What happens after the procedure? Your blood pressure, heart rate, breathing rate, and blood oxygen level will be monitored until you leave the hospital or clinic. You may have to wear compression stockings. These stockings help to prevent blood clots and reduce swelling in your legs. If you were given a sedative during the procedure, it can affect you for several hours. Do not drive or operate machinery until your health care provider says it is safe. You may be given pain medicine. You may need to drink more fluids to wash (flush) the dye out of your body. Drink enough  fluid to keep your urine pale yellow. Take over-the-counter and prescription medicines only as told by your health care provider. This is especially important if you were given blood thinners. Summary Left atrial appendage (LAA) closure device implantation is a procedure that is done to put a small device in the LAA of the heart. The LAA is a small sac in the wall of the heart's left upper chamber. The device closes the LAA to prevent stroke and other problems. Follow instructions from your health care provider before and after the procedure. This information is not intended to replace advice given to you by your health care provider. Make sure you discuss any questions you have with your health care provider. Document Revised: 07/21/2020 Document Reviewed: 07/21/2020 Elsevier Patient Education  [place].

## 2022-06-07 NOTE — Progress Notes (Signed)
[facility] [address] [place]. [place] [address] [phone]       OFFICE FOLLOW-UP NOTE  [name] Date of Birth:  May 10, 1946 Medical Record Number:  [id number]   HPI: Vernon [name] is a 76 year old Caucasian male seen today for initial office follow-up visit following hospital admission for intracerebral hemorrhage in March 2023.  He is accompanied by his wife.  History is obtained from them and review of electronic medical records and opossum reviewed pertinent available imaging films in PACS. [name] is a 76 y.o. male with history of anemia, arthritis, GERD, DM2, HTN and a-fib on Eliquis presened on 02/10/22 with vomiting and incoordiantion on the left side.  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  His Eliquis was reversed with [name] and he was transferred from outside hospital to [facility]  His neurological status began to worsen and he was intubated.  Repeat CT demonstrated enlarging ICH.  Neurosurgery was consulted and decompressive suboccipital craniectomy was performed on 02/11/22 by Dr Ellene Route.  3% HTS was started for cerebral edema. Patient has been extubated since 3/23 and did well.  Blood pressure goal tightly controlled.  Eliquis was held.  He was seen by physical occupational speech therapy.  Serial CT scan showed stable appearance of the hemorrhage and mass effect and postoperative changes.  Echocardiogram showed ejection fraction of 55 to 60%.  LDL cholesterol is 25 mg percent.  Hemoglobin A1c is 6.2.  Patient was felt to be a good candidate for inpatient rehab and was transferred there and did well and made gradual improvement and was discharged home on 03/20/2022.  He still getting home physical and occupational therapy which she has finished and recently started outpatient therapies.  He is able to ambulate with a walker but somebody has to stay close to him.  His left-sided coordination is improving but is not  back to normal.  His voice occasionally gets raspy when he is tired and he has some word finding difficulties.  Wife has also noticed that his comprehension is not good and his information processing is also not back to normal.  She has to repeat herself several times at times.  Patient had a follow-up CT scan on 02/20/2022 which showed expected evolutionary changes and postop changes in the left cerebral hemorrhage.  He was restarted on Eliquis which is tolerating well without bruising or bleeding.  Patient and wife are interested in the Watchman procedure and wants me to refer to Dr. Quentin Ore.   ROS:   14 system review of systems is positive for incoordination, gait imbalance, difficulty thinking, memory loss, word finding difficulty all other systems negative  PMH:  Past Medical History:  Diagnosis Date   Anemia    Aortic stenosis    Arthritis    Complication of anesthesia    hard time getting bp up after knee replacement   Diabetes mellitus without complication (HCC)    GERD (gastroesophageal reflux disease)    occ tums prn   History of hiatal hernia    Hypertension    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[place])     Social History:  Social History   Socioeconomic History   Marital status: Married    Spouse name: Not on file   Number of children: Not on file   Years of education: Not on file   Highest education level: Not on file  Occupational History   Not on file  Tobacco Use  Smoking status: Former    Packs/day: 0.50    Years: 8.00    Total pack years: 4.00    Types: Cigarettes    Quit date: 07/21/1978    Years since quitting: 43.9   Smokeless tobacco: Never  Vaping Use   Vaping Use: Never used  Substance and Sexual Activity   Alcohol use: Yes    Alcohol/week: 0.0 - 1.0 standard drinks of alcohol    Comment: rare beer   Drug use: Never   Sexual activity: Not on file  Other Topics Concern   Not on file  Social History Narrative   Lives in [place]; with wife; quit  smoking in early 84s; ocassional/ rare [may be 1 a month beer]. 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    Social Determinants of Health   Financial Resource Strain: Not on file  Food Insecurity: Not on file  Transportation Needs: Not on file  Physical Activity: Not on file  Stress: Not on file  Social Connections: Not on file  Intimate Partner Violence: Not on file    Medications:   Current Outpatient Medications on File Prior to Visit  Medication Sig Dispense Refill   ACCU-CHEK GUIDE test strip USE 1 STRIP 3 TIMES A DAY AS INSTRUCTED     acetaminophen (TYLENOL) 650 MG CR tablet Take 650 mg by mouth every 8 (eight) hours as needed for pain.     apixaban (ELIQUIS) 5 MG TABS tablet Take 5 mg by mouth 2 (two) times daily.      blood glucose meter kit and supplies KIT Dispense based on patient and insurance preference. Use up to four times daily as directed. 1 each 0   Insulin Pen Needle (CAREFINE PEN NEEDLES) 32G X 5 MM MISC 1 application. by Does not apply route daily after breakfast. 30 each 0   metFORMIN (GLUCOPHAGE) 500 MG tablet Take 1 tablet by mouth 2 (two) times daily with a meal.     ondansetron (ZOFRAN) 4 MG tablet Take 4 mg by mouth every 8 (eight) hours as needed.     polyethylene glycol (MIRALAX / GLYCOLAX) 17 g packet Take 17 g by mouth daily. 30 each 0   senna-docusate (SENOKOT-S) 8.6-50 MG tablet Take 1 tablet by mouth 2 (two) times daily. 60 tablet 0   allopurinol (ZYLOPRIM) 100 MG tablet Take 100 mg by mouth daily. (Patient not taking: Reported on 06/07/2022)     No current facility-administered medications on file prior to visit.    Allergies:  No Known Allergies  Physical Exam General: Obese elderly Caucasian male, seated, in no evident distress Head: head normocephalic and atraumatic.  Neck: supple with no carotid or supraclavicular bruits Cardiovascular: regular rate and rhythm, no murmurs Musculoskeletal: no deformity Skin:  no rash/petichiae Vascular:   Normal pulses all extremities Vitals:   06/07/22 1111  BP: 136/82  Pulse: 75   Neurologic Exam Mental Status: Awake and fully alert. Oriented to place and time. Recent and remote memory intact. Attention span, concentration and fund of knowledge appropriate. Mood and affect appropriate.  Diminished recall 2/3.  Able to name 13 animals which can walk on 4 legs. Cranial Nerves: Fundoscopic exam reveals sharp disc margins. Pupils equal, briskly reactive to light. Extraocular movements full without nystagmus. Visual fields full to confrontation. Hearing intact. Facial sensation intact. Face, tongue, palate moves normally and symmetrically.  Motor: Normal bulk and tone. Normal strength in all tested extremity muscles. Sensory.: intact to touch ,pinprick .position and vibratory sensation.  Coordination:  Impaired left finger-to-nose and knee to heel coordination. Gait and Station: Arises from chair without difficulty. Stance is broad-based uses a wheeled walker gait is slightly ataxic.   Reflexes: 1+ and symmetric. Toes downgoing.   NIHSS  2 Modified Rankin  3   ASSESSMENT: 76 year old Caucasian male with left cerebellar hemorrhage in March 2023 secondary to anticoagulation with Eliquis for chronic A-fib s/p decompressive suboccipital craniectomy was doing well but still has residual left hemiataxia , gait imbalance and mild cognitive impairment     PLAN: I had a long discussion with the patient and his wife regarding his recent cerebral hemorrhage related to anticoagulation with Eliquis for his atrial fibrillation and discussed treatment options including continuing anticoagulation versus referral to Watchman device.  I recommend he continue Eliquis for now and referral to Dr. Quentin Ore for [name] device procedure.  He will continue his ongoing physical and speech therapy and use his walker at all times and we discussed fall safety precautions.  He also has mild cognitive impairment and encouraged  him to increase participation in cognitively challenging activities like solving crossword puzzles, playing bridge, word searches and sudoku.  We also discussed memory compensation strategies.  He will return for follow-up in the future in 3 months or call earlier if necessary.   Greater than 50% of time during this 35 minute visit was spent on counseling,explanation of diagnosis, planning of further management, discussion with patient and family and coordination of care Antony Contras, MD Note: This document was prepared with digital dictation and possible smart phrase technology. Any transcriptional errors that result from this process are unintentional

## 2022-06-11 ENCOUNTER — Ambulatory Visit: Payer: Medicare Other | Admitting: Physical Therapy

## 2022-06-11 ENCOUNTER — Encounter: Payer: Self-pay | Admitting: Physical Therapy

## 2022-06-11 ENCOUNTER — Ambulatory Visit: Payer: Medicare Other | Admitting: Occupational Therapy

## 2022-06-11 ENCOUNTER — Ambulatory Visit: Payer: Medicare Other

## 2022-06-11 DIAGNOSIS — R2681 Unsteadiness on feet: Secondary | ICD-10-CM

## 2022-06-11 DIAGNOSIS — R41841 Cognitive communication deficit: Secondary | ICD-10-CM

## 2022-06-11 DIAGNOSIS — M6281 Muscle weakness (generalized): Secondary | ICD-10-CM

## 2022-06-11 DIAGNOSIS — I69254 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: Secondary | ICD-10-CM | POA: Diagnosis not present

## 2022-06-11 DIAGNOSIS — R2689 Other abnormalities of gait and mobility: Secondary | ICD-10-CM

## 2022-06-11 DIAGNOSIS — R471 Dysarthria and anarthria: Secondary | ICD-10-CM

## 2022-06-11 DIAGNOSIS — R278 Other lack of coordination: Secondary | ICD-10-CM

## 2022-06-11 NOTE — Therapy (Signed)
OUTPATIENT PHYSICAL THERAPY TREATMENT NOTE   Patient Name: Vernon [name] MRN: [id number] DOB:07/18/1946, 76 y.o., male Today's Date: 06/11/2022  PCP: Gracelyn Nurse, MD  REFERRING PROVIDER: Gracelyn Nurse, MD   END OF SESSION:   PT End of Session - 06/11/22 1459     Visit Number 10    Number of Visits 16    Date for PT Re-Evaluation 07/03/22    Authorization Type [employer] Medicare    Progress Note Due on Visit 19    PT Start Time 1453    Equipment Utilized During Treatment Gait belt    Activity Tolerance Patient tolerated treatment well    Behavior During Therapy WFL for tasks assessed/performed                Past Medical History:  Diagnosis Date   Anemia    Aortic stenosis    Arthritis    Complication of anesthesia    hard time getting bp up after knee replacement   Diabetes mellitus without complication (HCC)    GERD (gastroesophageal reflux disease)    occ tums prn   History of hiatal hernia    Hypertension    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HCC)    Past Surgical History:  Procedure Laterality Date   CARPAL TUNNEL RELEASE  2012   CRANIOTOMY N/A 02/10/2022   Procedure: SUBOCCIPITAL CRANIECTOMY FOR EVACUATION OF CEREBELLAR HEMATOMA;  Surgeon: Barnett Abu, MD;  Location: [facility] OR;  Service: Neurosurgery;  Laterality: N/A;   JOINT REPLACEMENT Right 2010   SHOULDER ARTHROSCOPY WITH ROTATOR CUFF REPAIR AND OPEN BICEPS TENODESIS Right 11/09/2019   Procedure: RIGHT SHOULDER ARTHROSCOPY WITH SUBSCAPULARIS REPAIR, SUBACROMIAL DECOMPRESSION,MINI OPEN ROTATOR CUFF REPAIR;  Surgeon: Signa Kell, MD;  Location: [facility] ORS;  Service: Orthopedics;  Laterality: Right;   Patient Active Problem List   Diagnosis Date Noted   Proteinuria, unspecified 05/23/2022   Simple renal cyst 05/23/2022   Long term current use of anticoagulant 04/04/2022   Rotator cuff syndrome 04/04/2022   Exposure to potentially hazardous substance 04/04/2022   Gout 04/04/2022   Osteoarthritis  04/04/2022   Osteoporosis 04/04/2022   Other specified diseases of hair and hair follicles 04/04/2022   Pain in joint, lower leg 04/04/2022   Problem related to unspecified psychosocial circumstances 04/04/2022   General medical examination for administrative purposes 04/04/2022   Stage 3b chronic kidney disease (HCC)    Dysphagia, post-stroke    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   Cough 02/20/2022   Acute metabolic encephalopathy 02/20/2022   Obstructive hydrocephalus (HCC) 02/19/2022   Dyslipidemia 02/19/2022   Dysphagia 02/19/2022   AKI (acute kidney injury) (HCC)    Hyperkalemia    Anemia    Pleural effusion on right    Respiratory failure requiring intubation (HCC)    Cerebral edema (HCC)    Chronic atrial fibrillation (HCC)    Controlled type 2 diabetes mellitus with hyperglycemia, without long-term current use of insulin (HCC)    ICH (intracerebral hemorrhage) (HCC) 02/10/2022   Intracranial hemorrhage (HCC)    Anticoagulated    Moderate tricuspid regurgitation 07/26/2021   Moderate aortic valve stenosis 03/08/2020   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HCC) 02/18/2020   Macrocytic anemia 01/13/2020   Spleen enlargement 01/13/2020   Bilateral carotid artery stenosis 07/14/2018   Atrial fibrillation, chronic (HCC) 07/14/2018   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   Essential hypertension 08/16/2014   Hyperlipemia, mixed 08/16/2014   Renal insufficiency 08/16/2014    REFERRING DIAG: I62.9 (ICD-10-CM) -  Nontraumatic intracranial hemorrhage, unspecified   THERAPY DIAG:  Other abnormalities of gait and mobility  Unsteadiness on feet  Muscle weakness (generalized)  Rationale for Evaluation and Treatment Rehabilitation  PERTINENT HISTORY: see MAR  PRECAUTIONS: fall risk  SUBJECTIVE: No changes since last visit.   PAIN:  Are you having pain? No pain in sitting; 3/10 pain with walking in L knee  OBJECTIVE:    TODAY'S TREATMENT:  06/11/2022 Activity Comments  TUG shuttle (sit to stand, 10 ft gait, then turn to sit); performed x 6 reps with RW; short distance gait in clinic, 20-30 ft using clinic RW with SBA Cues for slowed pace with gait.  Cues for hand placement for safety with turns.  Alt step taps to 6" step, then to 12" step, BUE support, wearing 2.5# weights   STanding outside of stair rail:  head turns/nods 2 x 5 reps, then EC 10 sec x 2, narrow BOS   Standing hip abduction 2 x 10 reps with 2.5# weights   With 2.5# weight on L ankle:  forward step ups to 4" step x 10; then lateral step up x 10   Forward lean to upright posture, x 5 reps; seated anterior/posterior pelvic tilts     PATIENT EDUCATION: Education details: Safety awareness with Slowed, Controlled movements (tips to think about:  counting a cadence, thinking he's moving so slowly that the therapist will need to ask him to move faster); discussed having family member bring him in/out in personal or clinic w/c due to safety concerns with gait Person educated: Patient Education method: Explanation Education comprehension: verbalized understanding  -------------------------------------------------------------------------------------------------------------------------   From eval  DIAGNOSTIC FINDINGS: see MAR   COGNITION: Overall cognitive status: Within functional limits for tasks assessed             SENSATION: WFL   COORDINATION: Bradykinesia, difficulty wit rapid alternating movements     MUSCLE TONE: WFL       POSTURE: No Significant postural limitations   LOWER EXTREMITY ROM:      WFL  (Blank rows = not tested)   LOWER EXTREMITY MMT:     MMT Right Eval Left Eval  Hip flexion 5 4  Hip extension 5 3+  Hip abduction 5 3-  Hip adduction      Hip internal rotation      Hip external rotation      Knee flexion 5 4  Knee extension 5 4  Ankle dorsiflexion 5 4+  Ankle plantarflexion      Ankle inversion      Ankle eversion       (Blank rows = not tested)   BED MOBILITY:  Independent   TRANSFERS: Assistive device utilized: Environmental consultant - 2 wheeled  Sit to stand: Modified independence Stand to sit: Modified independence Chair to chair: SBA and CGA Floor:  DNT   RAMP:  Not available   CURB:  Level of Assistance: SBA and CGA Assistive device utilized: Environmental consultant - 2 wheeled Curb Comments: undershoots with LLE   STAIRS:           Level of Assistance: SBA and CGA           Stair Negotiation Technique: Alternating Pattern  with Bilateral Rails           Number of Stairs: 6             Height of Stairs: 4-6"            Comments: dysmetria w/ LLE  GAIT: Gait pattern: circumduction- Left and ataxic Distance walked: 100 Assistive device utilized: Walker - 2 wheeled Level of assistance: SBA Comments: dysmetria LLE   FUNCTIONAL TESTs:  Timed up and go (TUG): 20.25 with RW Berg Balance Scale: 35/56      M-CTSIB  Condition 1: Firm Surface, EO 30 Sec, Mild Sway  Condition 2: Firm Surface, EC 30 Sec, Mild and Moderate Sway  Condition 3: Foam Surface, EO 9 Sec, Severe Sway  Condition 4: Foam Surface, EC 0 Sec,  unable  Sway    PATIENT SURVEYS:  FOTO 54.43% functional status   TODAY'S TREATMENT:  Evaluation, sidestepping along counter, tandem stance at counter     PATIENT EDUCATION: Education details: scope of PT intervention Person educated: Patient and Spouse Education method: Explanation Education comprehension: verbalized understanding     HOME EXERCISE PROGRAM: sidestepping along counter, tandem stance at counter   -----------------------------------------------------------------------------------------------------------------------    GOALS: Goals reviewed with patient? Yes   SHORT TERM GOALS: Target date: 05/29/2022   Patient will perform HEP with family/caregiver supervision for improved strength, balance, transfers, and gait  Baseline: Goal status: MET   2.  Patient will achieve 15  seconds for TUG test to manifest reduced risk for falls Baseline: 20.25 with RW; 14 sec with RW Goal status: MET   3. Demo modified independent gait level surfaces and curb negotiation            Baseline: CGA-SBA with RW            Goal status: On-going   LONG TERM GOALS: Target date: 06/26/2022   Patient will demonstrate score 45/56 Berg Balance Test to manifest low risk for falls Baseline: 35/56; 37/56 on 06/06/22 Goal status: On-going   2.  Demonstrate improved static balance and postural control per time of 15 sec condition 4 M-CTSIB to improve safety with mobility on uneven surfaces Baseline: complete LOB left with condition 3 Goal status: On-going   3.  Demo modified independent gait using least restrictive AD over various surfaces and ascend/descend stairs with set-up assist to improve functional mobility Baseline:  Goal status: on-going   4.  Demo score of 57% functional status FOTO  Baseline: 54.43% Goal status: INITIAL       ASSESSMENT:   CLINICAL IMPRESSION: Skilled PT session focused on functional strengthening and balance, with attention to slowed pace of movements.  He demonstrates impulsive movement patterns with initiation of transfers and gait and with turning to sit at times in session.  PT provides cues to slow movement patterns and attend to improved control, and he responds well.  Once cues removed, he reverts back to very quick, uncontrolled transitional movements, which could be a safety hazard with functional mobility.  He will continue to benefit from skilled PT towards goals for improved overall functional mobility and decreased fall risk.   OBJECTIVE IMPAIRMENTS Abnormal gait, decreased activity tolerance, decreased balance, decreased coordination, decreased endurance, decreased knowledge of use of DME, difficulty walking, decreased strength, impaired perceived functional ability, and improper body mechanics.    ACTIVITY LIMITATIONS carrying, lifting,  bending, standing, squatting, stairs, transfers, and locomotion level   PARTICIPATION LIMITATIONS: meal prep, cleaning, laundry, driving, shopping, community activity, and yard work   PERSONAL FACTORS Age, Fitness, and Time since onset of injury/illness/exacerbation are also affecting patient's functional outcome.    REHAB POTENTIAL: Excellent   CLINICAL DECISION MAKING: Evolving/moderate complexity   EVALUATION COMPLEXITY: Moderate   PLAN: PT FREQUENCY: 1-2x/week   PT DURATION: 9 weeks  PLANNED INTERVENTIONS: Therapeutic exercises, Therapeutic activity, Neuromuscular re-education, Balance training, Gait training, Patient/Family education, Joint mobilization, Stair training, Vestibular training, Canalith repositioning, DME instructions, Aquatic Therapy, Electrical stimulation, Wheelchair mobility training, and Manual therapy   PLAN FOR NEXT SESSION:  Functional lower extremity strengthening, controlled movement patterns with initiation of gait and turns; training with gait in/out of clinic to negotiate curbs and sidewalk.  Would a rolling walker with larger front wheels be a better option that his walker?                 Lonia Blood, PT 06/11/22 3:00 PM Phone: [phone] Fax: [phone]   [facility] [address] [place], [place] [address] Phone # [phone] Fax # [phone]

## 2022-06-11 NOTE — Therapy (Signed)
OUTPATIENT SPEECH LANGUAGE PATHOLOGY TREATMENT NOTE   Patient Name: Vernon [name] MRN: [id number] DOB:Apr 06, 1946, 76 y.o., male Today's Date: 06/11/2022  PCP: Baxter Hire, MD  REFERRING PROVIDER: Baxter Hire, MD   END OF SESSION:   End of Session - 06/11/22 2302     Visit Number 8    Number of Visits 25    Date for SLP Re-Evaluation 07/26/22    SLP Start Time 14    SLP Stop Time  1700    SLP Time Calculation (min) 44 min    Activity Tolerance Patient tolerated treatment well                 Past Medical History:  Diagnosis Date   Anemia    Aortic stenosis    Arthritis    Complication of anesthesia    hard time getting bp up after knee replacement   Diabetes mellitus without complication ([place])    GERD (gastroesophageal reflux disease)    occ tums prn   History of hiatal hernia    Hypertension    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[place])    Past Surgical History:  Procedure Laterality Date   CARPAL TUNNEL RELEASE  2012   CRANIOTOMY N/A 02/10/2022   Procedure: SUBOCCIPITAL CRANIECTOMY FOR EVACUATION OF CEREBELLAR HEMATOMA;  Surgeon: Kristeen Miss, MD;  Location: [place];  Service: Neurosurgery;  Laterality: N/A;   JOINT REPLACEMENT Right 2010   SHOULDER ARTHROSCOPY WITH ROTATOR CUFF REPAIR AND OPEN BICEPS TENODESIS Right 11/09/2019   Procedure: RIGHT SHOULDER ARTHROSCOPY WITH SUBSCAPULARIS REPAIR, SUBACROMIAL DECOMPRESSION,MINI OPEN ROTATOR CUFF REPAIR;  Surgeon: Leim Fabry, MD;  Location: [facility];  Service: Orthopedics;  Laterality: Right;   Patient Active Problem List   Diagnosis Date Noted   Proteinuria, unspecified 05/23/2022   Simple renal cyst 05/23/2022   Long term current use of anticoagulant 04/04/2022   Rotator cuff syndrome 04/04/2022   Exposure to potentially hazardous substance 04/04/2022   Gout 04/04/2022   Osteoarthritis 04/04/2022   Osteoporosis 04/04/2022   Other specified diseases of hair and hair follicles 22/44/9753   Pain in  joint, lower leg 04/04/2022   Problem related to unspecified psychosocial circumstances 04/04/2022   General medical examination for administrative purposes 04/04/2022   Stage 3b chronic kidney disease ([place])    Dysphagia, post-stroke    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 hemorrhage of brain ([place]) 02/26/2022   Cough 00/51/1021   Acute metabolic encephalopathy 11/73/5670   Obstructive hydrocephalus ([place]) 02/19/2022   Dyslipidemia 02/19/2022   Dysphagia 02/19/2022   AKI (acute kidney injury) ([place])    Hyperkalemia    Anemia    Pleural effusion on right    Respiratory failure requiring intubation (HCC)    Cerebral edema (HCC)    Chronic atrial fibrillation ([place])    Controlled type 2 diabetes mellitus with hyperglycemia, without long-term current use of insulin (HCC)    ICH (intracerebral hemorrhage) ([place]) 02/10/2022   Intracranial hemorrhage (HCC)    Anticoagulated    Moderate tricuspid regurgitation 07/26/2021   Moderate aortic valve stenosis 03/08/2020   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[place]) 02/18/2020   Macrocytic anemia 01/13/2020   Spleen enlargement 01/13/2020   Bilateral carotid artery stenosis 07/14/2018   Atrial fibrillation, chronic ([place]) 07/14/2018   Type 2 diabetes with nephropathy ([place]) 02/14/2016   Essential hypertension 08/16/2014   Hyperlipemia, mixed 08/16/2014   Renal insufficiency 08/16/2014    PERTINENT HISTORY:  March 2023 experienced hemorrhage in cerebellar peduncle and underwent craniotomy for evacuation. Notes his LUE and LLE are most  affected at present. Underwent acute hospitalization, and [facility]. Recently D/C from home health on 04/30/22. Did not receive HHST, only HHPT/OT.     ONSET DATE: 02/10/22   REFERRING DIAG: I62.9 (ICD-10-CM) - Nontraumatic intracranial hemorrhage, unspecified   THERAPY DIAG:  Dysarthria and anarthria  Cognitive communication deficit  Rationale for Evaluation and Treatment Rehabilitation  SUBJECTIVE: Vernon [name]) "He doesn't breathe  when he talks sometimes."   PAIN:  Are you having pain? Yes NPRS scale: 7/65, with certain movement Pain location: rt shoulder PAIN TYPE: ache Pain description: intermittent  Aggravating factors: certain movement Relieving factors: massage, rest   OBJECTIVE:   TODAY'S TREATMENT:  06/11/22: Vernon [name] states he has been completing PHoRTE BID, however was not completing the numbers (lo-hi-lo pitch) correctly - he was saying them in a loud voice instead of varied pitch. SLP corrected pt and he was mod I by session end. Vernon [name] req'd occasional faded to rare min-mod SLP cues for good breath prior to each rep for numbers and sentences, and to slow productions down for quality instead of rushing through reps. Loud "ah" definitely sounds stronger than previous session with this SLP on 05-31-22. SLP also believes sigh or yawn-sigh vocalization sounds more relaxed than "waterfall" vowel. Higher pitched (calling "over the fence") rated 4/10 effort (10=most effort), and lower pitched "scolding dog" voice rated 6-7/10 effort level.  SLP worked with pt re: calendar orientation and appointment tracking. Pt req'd extra time for telling SLP date and appointment times. He req'd min cues for recall of Dr. Leonie Man appointment last week but when provided this by SLP Vernon [name] gave details that were also shown in MD note.   06/06/22: PT BROUGHT FOLDER TODAY and wanted Vernon [name] to know he remembered. Pt reports he has been completing his exercises x2/day and reports others believe he's made improvement. Completed PHoRTE exercises. Required cueing to rduce tight/strained voice, as well as to slow down for adequate breath support. Pt was able to produce "Over the fence" voice sentences with more ease, rating it a 4/10 in effort level required. Pt completed authoritative voice sentences with more difficulty, rating it a 6/10 in effort level required.   Pt felt doing a sigh vs. Waterfall was easier to complete with less tension. SLP in  agreement.   05/31/22: Pt entered with his own folder today; "SEE I TOLD YOU I'D REMEMBER IT!" He had other things that he did each day written on his calendar as well. SLP introduced PHOrTE exercises to pt today (see pt instructions) and worked with him to achieve adequate breath prior to each rep (pt would rush at times and not achieve adequate breath support). SLP provided Vernon [name] with detailed instructions and also educated wife, alone in lobby at the time, on each exercise. No throat clearing the last two sessions.  05/28/22: Pt provided very detailed explanation regarding of his nephrology appointment last week, and about his plans for tomorrow with family. He stated next session he will bring his folder, instead of relying on Vernon [name]. SLP worked with pt on his voice exercises, requiring min A occasionally for strong voice without squeezing, and min A usually for abdominal breathing. Pt's loud /a/ was produced with WNL voicing 50%.   05/24/22: Semi-occluded vocal tract (SOVTE) exercises completed today with SLP min A due to pt strained voice. Good airflow with mostly all reps of exercises. Pt stated he has been practicing highlighted exercises BID. Loud "ha" with tremulous vocal quality, but mostly withotut strained voice (following SOVTE exercises).  Mid-upper 80s dB average for "ha". In conversation pt used WNL voicing ~50% of the time.  Again, no dysnomic errors today.      PATIENT EDUCATION: Education details: See "treatment" above Person educated: Patient  Education method: Explanation, Demonstration, Verbal cues, Handouts, and   Education comprehension: verbalized understanding, returned demonstration, verbal cues required, and needs further education         GOALS: Goals reviewed with patient? Yes   SHORT TERM GOALS: Target date: 05/31/22 Pt will complete HEP for voice/dysarthria with occasional min A over 2 sessions Baseline: 05/24/22 Goal status: Met   2.  Pt will use external aids to  recall daily events/schedule with occasional min A from spouse over 1 week Baseline:  Goal status: Deferred   3.  Pt will achieve clear phonation 18/20 sentences in structured task Baseline:  Goal status: Not met   4.  Pt will ID dysnomic errors and self correct with rare min A over 2 sessions Baseline:  Goal status: Deferred     LONG TERM GOALS: Target date: 07/26/22 Pt will achieve clear phonation over 10 minute conversation with rare min A over 2 sessions Baseline:  Goal status: Revised   2.  Pt will independently recall weekly/daily schedule, appointments events and pertinent information with memory aids over 2 sessions Baseline:  Goal status: Deferred   3.  Pt will complete vocal warm ups and follow 3 vocal hygiene strategies to return to singing for 5-10 minute periods with rare min A Baseline:  Goal status: Ongoing   4.  Improve score on Cognitive function PROM by 4 points Baseline: 101- filled out by spouse Goal status: Ongoing   ASSESSMENT:   CLINICAL IMPRESSION: Patient is a 76 y.o. male who is seen for dysarthria/voice and cognitive linguistics s/p CVA. See tx note. Pt brought book in today and req'd extra time for appointment tracking. Elison is a Therapist, nutritional and sings for fun and at church. I recommend cont'd skilled ST to maximize voice and cognition for safety, to reduce caregiver burden and QOL.    OBJECTIVE IMPAIRMENTS include attention, memory, awareness, dysarthria, and voice disorder. These impairments are limiting patient from managing appointments, household responsibilities, and effectively communicating at home and in community. Factors affecting potential to achieve goals and functional outcome are ability to learn/carryover information.. Patient will benefit from skilled SLP services to address above impairments and improve overall function.   REHAB POTENTIAL: Good   PLAN: SLP FREQUENCY: 2x/week   SLP DURATION: 12 weeks   PLANNED INTERVENTIONS:  Language facilitation, Environmental controls, Cueing hierachy, Cognitive reorganization, Internal/external aids, Functional tasks, Multimodal communication approach, and SLP instruction and feedback         [place], [place] 06/11/2022, 11:03 PM

## 2022-06-11 NOTE — Therapy (Signed)
OUTPATIENT OCCUPATIONAL THERAPY Treatment Note  Patient Name: Vernon [name] MRN: [id number] DOB:Mar 03, 1946, 76 y.o., male Today's Date: 06/11/2022  PCP: Baxter Hire, MD REFERRING PROVIDER: Baxter Hire, MD   OT End of Session - 06/11/22 1553     Visit Number 9    Number of Visits 17    Date for OT Re-Evaluation 07/06/22    Authorization Type [employer] Medicare    Authorization Time Period VL: [place]    OT Start Time 1535    OT Stop Time 1615    OT Time Calculation (min) 40 min    Activity Tolerance Patient tolerated treatment well    Behavior During Therapy WFL for tasks assessed/performed             Past Medical History:  Diagnosis Date   Anemia    Aortic stenosis    Arthritis    Complication of anesthesia    hard time getting bp up after knee replacement   Diabetes mellitus without complication (HCC)    GERD (gastroesophageal reflux disease)    occ tums prn   History of hiatal hernia    Hypertension    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[place])    Past Surgical History:  Procedure Laterality Date   CARPAL TUNNEL RELEASE  2012   CRANIOTOMY N/A 02/10/2022   Procedure: SUBOCCIPITAL CRANIECTOMY FOR EVACUATION OF CEREBELLAR HEMATOMA;  Surgeon: Kristeen Miss, MD;  Location: [place];  Service: Neurosurgery;  Laterality: N/A;   JOINT REPLACEMENT Right 2010   SHOULDER ARTHROSCOPY WITH ROTATOR CUFF REPAIR AND OPEN BICEPS TENODESIS Right 11/09/2019   Procedure: RIGHT SHOULDER ARTHROSCOPY WITH SUBSCAPULARIS REPAIR, SUBACROMIAL DECOMPRESSION,MINI OPEN ROTATOR CUFF REPAIR;  Surgeon: Leim Fabry, MD;  Location: [facility];  Service: Orthopedics;  Laterality: Right;   Patient Active Problem List   Diagnosis Date Noted   Proteinuria, unspecified 05/23/2022   Simple renal cyst 05/23/2022   Long term current use of anticoagulant 04/04/2022   Rotator cuff syndrome 04/04/2022   Exposure to potentially hazardous substance 04/04/2022   Gout 04/04/2022   Osteoarthritis 04/04/2022    Osteoporosis 04/04/2022   Other specified diseases of hair and hair follicles 67/10/4579   Pain in joint, lower leg 04/04/2022   Problem related to unspecified psychosocial circumstances 04/04/2022   General medical examination for administrative purposes 04/04/2022   Stage 3b chronic kidney disease ([place])    Dysphagia, post-stroke    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 hemorrhage of brain ([place]) 02/26/2022   Cough 99/83/3825   Acute metabolic encephalopathy 05/39/7673   Obstructive hydrocephalus ([place]) 02/19/2022   Dyslipidemia 02/19/2022   Dysphagia 02/19/2022   AKI (acute kidney injury) ([place])    Hyperkalemia    Anemia    Pleural effusion on right    Respiratory failure requiring intubation (HCC)    Cerebral edema (HCC)    Chronic atrial fibrillation ([place])    Controlled type 2 diabetes mellitus with hyperglycemia, without long-term current use of insulin (HCC)    ICH (intracerebral hemorrhage) ([place]) 02/10/2022   Intracranial hemorrhage (HCC)    Anticoagulated    Moderate tricuspid regurgitation 07/26/2021   Moderate aortic valve stenosis 03/08/2020   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[place]) 02/18/2020   Macrocytic anemia 01/13/2020   Spleen enlargement 01/13/2020   Bilateral carotid artery stenosis 07/14/2018   Atrial fibrillation, chronic ([place]) 07/14/2018   Type 2 diabetes with nephropathy ([place]) 02/14/2016   Essential hypertension 08/16/2014   Hyperlipemia, mixed 08/16/2014   Renal insufficiency 08/16/2014    ONSET DATE: 02/10/2022  REFERRING DIAG: I62.9 (ICD-10-CM) -  Nontraumatic intracranial hemorrhage, unspecified  THERAPY DIAG:  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 Ellene Route on 02/11/22 w/ EVD pulled 02/14/22. PMH includes DMT2, HTN, HLD, atrial fibrillation on Eliquis, arthritis, anemia, and GERD  PAIN: Are you having pain? No  PATIENT GOALS: Be independent again; play guitar  OBJECTIVE:   TODAY'S TREATMENT:  Reiterated education from previous session in regards to visual attention to task during management of shirt buttons. Pt continues to demonstrate significant difficulty due to LUE ataxia, however able to demonstrate improvements when slowing down and visually attending to task.  Question if pt would truly be able to utilize AE due to ataxia.  Engaged in small peg board pattern replication with use of LUE.  Pt demonstrating increased frustration and difficulty picking up and manipulating small pegs.  Therapist encouraged pt to slow down and utilize closed chain with elbow/forearm support on table top to improve stability and decrease ataxia.  Pt able to demonstrate improvements with closed chain/WB positioning, however unable to carryover during task. Engaged in reaching activity with reaching across midline and picking up 1" items to then place outside BOS to L into corresponding colored targets.  Pt demonstrating increased ataxia with increased distance of reach both across midline and outside BOS.  Pt dropping 2 of 16 items during first attempt and then during 2nd attempt knocking over 2 of 5 targets due to increased ataxia with proximity to location. Engaged in clock task with functional reaching in clock pattern.  Utilized UE Ranger initially to further challenge motor control against mild resistance in UE Ranger wall position.  Pt demonstrating increased difficulty with use of wall position, therefore transitioned to reaching without use of UE Ranger.  Pt able to complete with moderate  ataxia, noting increased ataxia with increased distance (again across midline and outside BOS). Therapist providing CGA for standing balance during reaching task.   PATIENT EDUCATION: Ongoing condition-specific education, particularly regarding neuro reed and typical recovery patterns as well as benefit and goal of weight bearing/increased somatosensory input through affected UE Person educated: Patient Education method: Explanation Education comprehension: verbalized understanding   HOME EXERCISE PROGRAM: Coordination handout - see pt instructions  Access Code: [email or web address] URL: [email or web address] Date: 06/04/2022 Prepared by: [facility]  Exercises - Seated Shoulder Flexion with Dowel to 90  - 4 x weekly - 2-3 sets - 10 reps - Seated Chest Press with Bar  - 4 x weekly - 2-3 sets - 10 reps - Single Arm Shoulder Flexion with Dumbbell  - 4 x weekly - 2-3 sets - 10 reps - Seated Shoulder External Rotation with Dumbbell  - 4 x weekly - 2-3 sets - 10 reps   GOALS: Goals reviewed with patient? Yes  SHORT TERM GOALS: Target date: 06/08/22  STG  Status:  1 Pt will demonstrate independence w/ initial HEP designed for LUE GM and [place] Baseline: No HEP at this time Progressing  2 Pt will improve participation in functional bilateral FM tasks as evidenced by decreasing time to complete 9-Hole Peg Test w/ LUE by at least 6 sec  Baseline: Right: 21.8  sec; Left: 51.2 sec Progressing - 06/04/22 51.66 sec  3 Pt will be able to complete simulated bilateral functional task (e.g. cutting food, manipulating clothing fasteners, washing dishes) within reasonable amount of time Baseline: Difficulty w/ bilateral tasks Progressing     LONG TERM GOALS: Target date: 07/06/22  LTG  Status:  1 Pt will improve participation in functional activities as evidenced by increasing FOTO score to 71 by d/c Baseline: 89 Progressing  2 Pt will demonstrate improved  control and accuracy w/ LUE by completing 8 taps within 10 seconds during finger to nose test Baseline: Right: 12 taps in 10 sec; Left: 6 taps in 10 sec Progressing  3 Pt will improve Box and Blocks score to at least 40 blocks by d/c to indicate improved unilateral GMC of LUE Baseline: Right 61 blocks, Left 29 blocks Progressing  4 Pt will safely demonstrate simulated grilling activity, incorporating compensatory strategies/AE prn, w/ Mod I by d/c Baseline: Unable to complete at this time Progressing     ASSESSMENT:  CLINICAL IMPRESSION: Treatment session with focus on closed and open chain movements, reaching across midline and outside BOS.  Pt demonstrates decreased impact of ataxia when able to increase proprioceptive input through UE.  Pt demonstrates decreased carryover of closed chain activities.  Pt continues to require min-mod cues for pacing and sequencing during structured tasks as well as functional mobility, such as transfers and ambulation to/from and during therapy session.  Pt continues to demonstrate ataxia with both gross and fine motor tasks, benefiting from verbal cues for pacing, as decreased speed causes increased accuracy and success. OT also encouraged pt to bring in video of him attempting guitar to assist w/ OT developing compensatory methods, modifications, or adaptive strategies prn.  PERFORMANCE DEFICITS in functional skills including ADLs, IADLs, coordination, dexterity, sensation, strength, FMC, GMC, mobility, balance, body mechanics, and UE functional use.  IMPAIRMENTS are limiting patient from ADLs, IADLs, and leisure.   COMORBIDITIES has co-morbidities such as atrial fibrillation on Eliquis and DMT2  that affects occupational performance. Patient will benefit from skilled OT to address above impairments and improve overall function.  MODIFICATION OR ASSISTANCE TO COMPLETE EVALUATION: Min-Moderate modification of tasks or assist with assess necessary to complete an  evaluation.  OT OCCUPATIONAL PROFILE AND HISTORY: Detailed assessment: Review of records and additional review of physical, cognitive, psychosocial history related to current functional performance.  CLINICAL DECISION MAKING: Moderate - several treatment options, min-mod task modification necessary  REHAB POTENTIAL: Good  EVALUATION COMPLEXITY: Moderate   PLAN: OT FREQUENCY: 2x/week  OT DURATION: 8 weeks  PLANNED INTERVENTIONS: self care/ADL training, therapeutic exercise, therapeutic activity, neuromuscular re-education, manual therapy, functional mobility training, aquatic therapy, electrical stimulation, ultrasound, biofeedback, moist heat, cryotherapy, patient/family education, energy conservation, and DME and/or AE instructions  RECOMMENDED OTHER SERVICES: To receive PT/ST services at this location  CONSULTED AND AGREED WITH PLAN OF CARE: Patient and family member/caregiver  PLAN FOR NEXT SESSION:   GMC/FMC activities for LUE. Seated vs standing weight-bearing, closed-chain exercise, coordination activities, etc.  Functional in nature, if possible, to carry over to pt desire to play guitar again.   Simonne Come, OTR/L 06/11/2022, 3:53 PM

## 2022-06-12 ENCOUNTER — Other Ambulatory Visit: Payer: Self-pay

## 2022-06-12 DIAGNOSIS — D469 Myelodysplastic syndrome, unspecified: Secondary | ICD-10-CM

## 2022-06-13 ENCOUNTER — Inpatient Hospital Stay: Payer: Medicare Other

## 2022-06-13 VITALS — BP 147/64 | HR 52

## 2022-06-13 DIAGNOSIS — D469 Myelodysplastic syndrome, unspecified: Secondary | ICD-10-CM

## 2022-06-13 DIAGNOSIS — D461 Refractory anemia with ring sideroblasts: Secondary | ICD-10-CM | POA: Diagnosis not present

## 2022-06-13 LAB — HEMOGLOBIN AND HEMATOCRIT, BLOOD
HCT: 27.1 % — ABNORMAL LOW (ref 39.0–52.0)
Hemoglobin: 8.9 g/dL — ABNORMAL LOW (ref 13.0–17.0)

## 2022-06-13 MED ORDER — EPOETIN ALFA-EPBX 10000 UNIT/ML IJ SOLN: 20000.0000 [IU] | Freq: Once | INTRAMUSCULAR | Status: DC

## 2022-06-13 MED ORDER — EPOETIN ALFA-EPBX 10000 UNIT/ML IJ SOLN
20000.0000 [IU] | Freq: Once | INTRAMUSCULAR | Status: AC
  Administered 2022-06-13: 20000 [IU] via SUBCUTANEOUS

## 2022-06-14 ENCOUNTER — Ambulatory Visit: Payer: Medicare Other | Admitting: Occupational Therapy

## 2022-06-14 ENCOUNTER — Ambulatory Visit: Payer: Medicare Other

## 2022-06-14 ENCOUNTER — Ambulatory Visit: Payer: Medicare Other | Admitting: Physical Therapy

## 2022-06-14 ENCOUNTER — Encounter: Payer: Self-pay | Admitting: Physical Therapy

## 2022-06-14 DIAGNOSIS — I69254 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: Secondary | ICD-10-CM

## 2022-06-14 DIAGNOSIS — R278 Other lack of coordination: Secondary | ICD-10-CM

## 2022-06-14 DIAGNOSIS — M6281 Muscle weakness (generalized): Secondary | ICD-10-CM

## 2022-06-14 DIAGNOSIS — R2681 Unsteadiness on feet: Secondary | ICD-10-CM

## 2022-06-14 DIAGNOSIS — R2689 Other abnormalities of gait and mobility: Secondary | ICD-10-CM

## 2022-06-14 DIAGNOSIS — R41841 Cognitive communication deficit: Secondary | ICD-10-CM

## 2022-06-14 DIAGNOSIS — R471 Dysarthria and anarthria: Secondary | ICD-10-CM

## 2022-06-14 NOTE — Therapy (Signed)
OUTPATIENT OCCUPATIONAL THERAPY Treatment Note & Progress Note  Patient Name: Vernon [name] MRN: [id number] DOB:Dec 28, 1945, 76 y.o., male Today's Date: 06/14/2022  PCP: Baxter Hire, MD REFERRING PROVIDER: Baxter Hire, MD   Occupational Therapy Progress Note  Dates of Reporting Period: 05/02/2022 to 06/14/22  Reason Skilled Services are Required: Pt is demonstrating progress with gross motor control, however slower with fine motor tasks this session, utilizing strategies educated on in regards to closed chain, stabilization, and visual attention to task to increase motor control.  Pt continues to demonstrate mod to max ataxia with fine and gross motor tasks impacting ease with completing self-care tasks and leisure pursuits. Pt will benefit from continued OT services to address strength and coordination, ROM, pain management, altered sensation, balance, GM/FM control, cognition, safety awareness, introduction of compensatory strategies/AE prn, and implementation of an HEP to improve participation and safety during ADLs and leisure pursuits.     OT End of Session - 06/14/22 1412     Visit Number 10    Number of Visits 17    Date for OT Re-Evaluation 07/06/22    Authorization Type [employer] Medicare    Authorization Time Period VL: [place]    OT Start Time 1405    OT Stop Time 1447    OT Time Calculation (min) 42 min    Activity Tolerance Patient tolerated treatment well    Behavior During Therapy WFL for tasks assessed/performed              Past Medical History:  Diagnosis Date   Anemia    Aortic stenosis    Arthritis    Complication of anesthesia    hard time getting bp up after knee replacement   Diabetes mellitus without complication (HCC)    GERD (gastroesophageal reflux disease)    occ tums prn   History of hiatal hernia    Hypertension    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[name])    Past Surgical History:  Procedure Laterality Date   CARPAL TUNNEL RELEASE  2012    CRANIOTOMY N/A 02/10/2022   Procedure: SUBOCCIPITAL CRANIECTOMY FOR EVACUATION OF CEREBELLAR HEMATOMA;  Surgeon: Kristeen Miss, MD;  Location: [place];  Service: Neurosurgery;  Laterality: N/A;   JOINT REPLACEMENT Right 2010   SHOULDER ARTHROSCOPY WITH ROTATOR CUFF REPAIR AND OPEN BICEPS TENODESIS Right 11/09/2019   Procedure: RIGHT SHOULDER ARTHROSCOPY WITH SUBSCAPULARIS REPAIR, SUBACROMIAL DECOMPRESSION,MINI OPEN ROTATOR CUFF REPAIR;  Surgeon: Leim Fabry, MD;  Location: [facility];  Service: Orthopedics;  Laterality: Right;   Patient Active Problem List   Diagnosis Date Noted   Proteinuria, unspecified 05/23/2022   Simple renal cyst 05/23/2022   Long term current use of anticoagulant 04/04/2022   Rotator cuff syndrome 04/04/2022   Exposure to potentially hazardous substance 04/04/2022   Gout 04/04/2022   Osteoarthritis 04/04/2022   Osteoporosis 04/04/2022   Other specified diseases of hair and hair follicles 49/44/9675   Pain in joint, lower leg 04/04/2022   Problem related to unspecified psychosocial circumstances 04/04/2022   General medical examination for administrative purposes 04/04/2022   Stage 3b chronic kidney disease ([place])    Dysphagia, post-stroke    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 hemorrhage of brain ([place]) 02/26/2022   Cough 91/63/8466   Acute metabolic encephalopathy 59/93/5701   Obstructive hydrocephalus ([place]) 02/19/2022   Dyslipidemia 02/19/2022   Dysphagia 02/19/2022   AKI (acute kidney injury) ([place])    Hyperkalemia    Anemia    Pleural effusion on right    Respiratory failure requiring intubation ([place])  Cerebral edema (HCC)    Chronic atrial fibrillation (HCC)    Controlled type 2 diabetes mellitus with hyperglycemia, without long-term current use of insulin (HCC)    ICH (intracerebral hemorrhage) ([place]) 02/10/2022   Intracranial hemorrhage (HCC)    Anticoagulated    Moderate tricuspid regurgitation 07/26/2021   Moderate aortic valve stenosis 03/08/2020   MDS  (myelodysplastic syndrome) ([place]) 02/18/2020   Macrocytic anemia 01/13/2020   Spleen enlargement 01/13/2020   Bilateral carotid artery stenosis 07/14/2018   Atrial fibrillation, chronic ([place]) 07/14/2018   Type 2 diabetes with nephropathy ([place]) 02/14/2016   Essential hypertension 08/16/2014   Hyperlipemia, mixed 08/16/2014   Renal insufficiency 08/16/2014    ONSET DATE: 02/10/2022  REFERRING DIAG: I62.9 (ICD-10-CM) - Nontraumatic intracranial hemorrhage, unspecified  THERAPY DIAG:  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 Ellene Route on 02/11/22 w/ EVD pulled 02/14/22. PMH includes DMT2, HTN, HLD, atrial fibrillation on Eliquis, arthritis, anemia, and GERD  PAIN: Are you having pain? No  PATIENT GOALS: Be independent again; play guitar  OBJECTIVE:   TODAY'S TREATMENT:  Functional mobility from car to clinic with navigating curb with RW.  Therapist providing min assist and mod multimodal cues for technique, sequencing, and safety during mobility. Pt continues to push RW too far in front, especially when stepping up curb.  Min assist for safety and stability when navigating curb and when  Engaged in dynamic standing balance with functional reach outside BOS with LUE to reach for bean bags and then toss to matching colored dots on the floor.  Utilized 1# wrist weight for improved proprioception.  Completed 3 sets with weight and 2 without weight.  Pt demonstrating improvements with weight on wrist for additional proprioception, but was able to  complete task with improvements post weight with cues to visually attend to UE with grasp and release when tossing. Box and Blocks: 34 blocks in 1 min with LUE.  Pt taking time during task to scoot blocks aside which may have cost him some time. 9 hole peg test: 1:12.03 with LUE.  Pt attempting to stabilize L elbow against body to decrease ataxia with some improvement, however pt still dropping 5 pegs due to difficulty with grading pressure when picking up and rotating pegs.   Completed a 2nd attempt with pt completing in 1:12.83, however only dropping 1 peg when placing pegs. Engaged in simulated cutting.  Pt able to cut with use of LUE as stabilizer with stabbing fork into "food" pt still demonstrating ataxic movements, however able to complete with increased time and effort.   Nose to finger: 8 in 10 second time limit.  Pt stabilizing UE at elbow along torso when completing task for improved motor control.   PATIENT EDUCATION: Ongoing condition-specific education, particularly regarding neuro reed and typical recovery patterns as well as benefit and goal of weight bearing/increased somatosensory input through affected UE Person educated: Patient Education method: Explanation Education comprehension: verbalized understanding   HOME EXERCISE PROGRAM: Coordination handout - see pt instructions  Access Code: [email or web address] URL: [email or web address] Date: 06/04/2022 Prepared by: [facility]  Exercises - Seated Shoulder Flexion  with Dowel to 90  - 4 x weekly - 2-3 sets - 10 reps - Seated Chest Press with Bar  - 4 x weekly - 2-3 sets - 10 reps - Single Arm Shoulder Flexion with Dumbbell  - 4 x weekly - 2-3 sets - 10 reps - Seated Shoulder External Rotation with Dumbbell  - 4 x weekly - 2-3 sets - 10 reps   GOALS: Goals reviewed with patient? Yes  SHORT TERM GOALS: Target date: 06/08/22  STG  Status:  1 Pt will demonstrate independence w/  initial HEP designed for LUE GM and [place] Baseline: No HEP at this time Progressing  2 Pt will improve participation in functional bilateral FM tasks as evidenced by decreasing time to complete 9-Hole Peg Test w/ LUE by at least 6 sec Baseline: Right: 21.8  sec; Left: 51.2 sec Progressing - 06/04/22 51.66 sec 06/14/22 - 1:12.03 sec  3 Pt will be able to complete simulated bilateral functional task (e.g. cutting food, manipulating clothing fasteners, washing dishes) within reasonable amount of time Baseline: Difficulty w/ bilateral tasks Progressing     LONG TERM GOALS: Target date: 07/06/22  LTG  Status:  1 Pt will improve participation in functional activities as evidenced by increasing FOTO score to 71 by d/c Baseline: 62 Progressing  2 Pt will demonstrate improved control and accuracy w/ LUE by completing 8 taps within 10 seconds during finger to nose test Baseline: Right: 12 taps in 10 sec; Left: 6 taps in 10 sec Progressing - 8 taps in 10 seconds (while stabilizing elbow at side)  3 Pt will improve Box and Blocks score to at least 40 blocks by d/c to indicate improved unilateral GMC of LUE Baseline: Right 61 blocks, Left 29 blocks Progressing - 06/14/22 - 34 blocks  4 Pt will safely demonstrate simulated grilling activity, incorporating compensatory strategies/AE prn, w/ Mod I by d/c Baseline: Unable to complete at this time Progressing     ASSESSMENT:  CLINICAL IMPRESSION: Treatment session with focus on closed and open chain movements, reaching across midline and outside BOS.  Pt demonstrates decreased impact of ataxia when able to increase proprioceptive input through UE by either closed chain on surface or stabilizing elbow along torso.  Pt continues to require min-mod cues for pacing and sequencing during structured tasks, especially functional mobility with transfers and ambulation to/from and during therapy session.  Pt continues to demonstrate ataxia with both gross and fine motor  tasks, benefiting from verbal cues for pacing, as decreased speed causes increased accuracy and success.   PERFORMANCE DEFICITS in functional skills including ADLs, IADLs, coordination, dexterity, sensation, strength, FMC, GMC, mobility, balance, body mechanics, and UE functional use.  IMPAIRMENTS are limiting patient from ADLs, IADLs, and leisure.   COMORBIDITIES has co-morbidities such as atrial fibrillation on Eliquis and DMT2  that affects occupational performance. Patient will benefit from skilled OT to address above impairments and improve overall function.  MODIFICATION OR ASSISTANCE TO COMPLETE EVALUATION: Min-Moderate modification of tasks or assist with assess necessary to complete an evaluation.  OT OCCUPATIONAL PROFILE AND HISTORY: Detailed assessment: Review of records and additional review of physical, cognitive, psychosocial history related to current functional performance.  CLINICAL DECISION MAKING: Moderate - several treatment options, min-mod task modification necessary  REHAB POTENTIAL: Good  EVALUATION COMPLEXITY: Moderate   PLAN: OT FREQUENCY: 2x/week  OT DURATION: 8 weeks  PLANNED INTERVENTIONS: self care/ADL training, therapeutic exercise, therapeutic activity, neuromuscular re-education, manual therapy, functional mobility training, aquatic therapy, electrical stimulation, ultrasound,  biofeedback, moist heat, cryotherapy, patient/family education, energy conservation, and DME and/or AE instructions  RECOMMENDED OTHER SERVICES: To receive PT/ST services at this location  CONSULTED AND AGREED WITH PLAN OF CARE: Patient and family member/caregiver  PLAN FOR NEXT SESSION:   GMC/FMC activities for LUE. Seated vs standing weight-bearing, closed-chain exercise, coordination activities, etc.  Functional in nature, if possible, to carry over to pt desire to play guitar again.   Simonne Come, OTR/L 06/14/2022, 2:12 PM

## 2022-06-14 NOTE — Therapy (Signed)
OUTPATIENT SPEECH LANGUAGE PATHOLOGY TREATMENT NOTE   Patient Name: Vernon [name] MRN: [id number] DOB:09-23-1946, 76 y.o., male Today's Date: 06/14/2022  PCP: Baxter Hire, MD  REFERRING PROVIDER: Baxter Hire, MD   END OF SESSION:   End of Session - 06/14/22 1720     Visit Number 9    Number of Visits 25    Date for SLP Re-Evaluation 07/26/22    SLP Start Time 1620    SLP Stop Time  1700    SLP Time Calculation (min) 40 min    Activity Tolerance Patient tolerated treatment well                 Past Medical History:  Diagnosis Date   Anemia    Aortic stenosis    Arthritis    Complication of anesthesia    hard time getting bp up after knee replacement   Diabetes mellitus without complication ([place])    GERD (gastroesophageal reflux disease)    occ tums prn   History of hiatal hernia    Hypertension    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[place])    Past Surgical History:  Procedure Laterality Date   CARPAL TUNNEL RELEASE  2012   CRANIOTOMY N/A 02/10/2022   Procedure: SUBOCCIPITAL CRANIECTOMY FOR EVACUATION OF CEREBELLAR HEMATOMA;  Surgeon: Kristeen Miss, MD;  Location: [place];  Service: Neurosurgery;  Laterality: N/A;   JOINT REPLACEMENT Right 2010   SHOULDER ARTHROSCOPY WITH ROTATOR CUFF REPAIR AND OPEN BICEPS TENODESIS Right 11/09/2019   Procedure: RIGHT SHOULDER ARTHROSCOPY WITH SUBSCAPULARIS REPAIR, SUBACROMIAL DECOMPRESSION,MINI OPEN ROTATOR CUFF REPAIR;  Surgeon: Leim Fabry, MD;  Location: [facility];  Service: Orthopedics;  Laterality: Right;   Patient Active Problem List   Diagnosis Date Noted   Proteinuria, unspecified 05/23/2022   Simple renal cyst 05/23/2022   Long term current use of anticoagulant 04/04/2022   Rotator cuff syndrome 04/04/2022   Exposure to potentially hazardous substance 04/04/2022   Gout 04/04/2022   Osteoarthritis 04/04/2022   Osteoporosis 04/04/2022   Other specified diseases of hair and hair follicles 40/98/1191   Pain in  joint, lower leg 04/04/2022   Problem related to unspecified psychosocial circumstances 04/04/2022   General medical examination for administrative purposes 04/04/2022   Stage 3b chronic kidney disease ([place])    Dysphagia, post-stroke    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 hemorrhage of brain ([place]) 02/26/2022   Cough 47/82/9562   Acute metabolic encephalopathy 13/06/6577   Obstructive hydrocephalus ([place]) 02/19/2022   Dyslipidemia 02/19/2022   Dysphagia 02/19/2022   AKI (acute kidney injury) ([place])    Hyperkalemia    Anemia    Pleural effusion on right    Respiratory failure requiring intubation (HCC)    Cerebral edema (HCC)    Chronic atrial fibrillation ([place])    Controlled type 2 diabetes mellitus with hyperglycemia, without long-term current use of insulin (HCC)    ICH (intracerebral hemorrhage) ([place]) 02/10/2022   Intracranial hemorrhage (HCC)    Anticoagulated    Moderate tricuspid regurgitation 07/26/2021   Moderate aortic valve stenosis 03/08/2020   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[place]) 02/18/2020   Macrocytic anemia 01/13/2020   Spleen enlargement 01/13/2020   Bilateral carotid artery stenosis 07/14/2018   Atrial fibrillation, chronic ([place]) 07/14/2018   Type 2 diabetes with nephropathy ([place]) 02/14/2016   Essential hypertension 08/16/2014   Hyperlipemia, mixed 08/16/2014   Renal insufficiency 08/16/2014    PERTINENT HISTORY:  March 2023 experienced hemorrhage in cerebellar peduncle and underwent craniotomy for evacuation. Notes his LUE and LLE are most  affected at present. Underwent acute hospitalization, and [facility]. Recently D/C from home health on 04/30/22. Did not receive HHST, only HHPT/OT.     ONSET DATE: 02/10/22   REFERRING DIAG: I62.9 (ICD-10-CM) - Nontraumatic intracranial hemorrhage, unspecified   THERAPY DIAG:  Dysarthria and anarthria  Cognitive communication deficit  Rationale for Evaluation and Treatment Rehabilitation  SUBJECTIVE: Vernon [name]) "He doesn't breathe  when he talks sometimes."   PAIN:  Are you having pain? Yes NPRS scale: 4/19, with certain movement Pain location: rt shoulder PAIN TYPE: ache Pain description: intermittent  Aggravating factors: certain movement Relieving factors: massage, rest   OBJECTIVE:   TODAY'S TREATMENT:  06/14/22: SLP engaged pt with his PHoRTE HEP today in presence of wife, given the fact Vernon [name] was not performing HEP correctly spontaneously previous session. SLP reiterated for pt/wife today how important correct completion of PHoRTE is, and that if pt's voice is not notably improved in 4 weeks ENT consult may be warranted. With occasional min A faded to rare min A for breath support, and occasional min A for "sigh" warm ups and for pitch lo-hi-lo numbers, pt completed HEP. fPrior to this, pt req'd shaping of abdominal breathing (AB) due to clavicular/thoracic breathing. Wife asking questions about PHoRTE throughout session and SLP answering these. SLP told Vernon [name] that Vernon [name] needed to "play the role of Vernon [name]" if pt is not completing exercises correctly at home. Pt acknowledged this.  Pt stated SLP did not put his PHoRTE HEP back in his folder after previous session but HEP was in pocket inside front cover of notebook.  06/11/22: Vernon [name] states he has been completing PHoRTE BID, however was not completing the numbers (lo-hi-lo pitch) correctly - he was saying them in a loud voice instead of varied pitch. SLP corrected pt and he was mod I by session end. Vernon [name] req'd occasional faded to rare min-mod SLP cues for good breath prior to each rep for numbers and sentences, and to slow productions down for quality instead of rushing through reps. Loud "ah" definitely sounds stronger than previous session with this SLP on 05-31-22. SLP also believes sigh or yawn-sigh vocalization sounds more relaxed than "waterfall" vowel. Higher pitched (calling "over the fence") rated 4/10 effort (10=most effort), and lower pitched "scolding dog" voice  rated 6-7/10 effort level.  SLP worked with pt re: calendar orientation and appointment tracking. Pt req'd extra time for telling SLP date and appointment times. He req'd min cues for recall of Dr. Leonie Man appointment last week but when provided this by SLP Vernon [name] gave details that were also shown in MD note.   06/06/22: PT BROUGHT FOLDER TODAY and wanted Vernon [name] to know he remembered. Pt reports he has been completing his exercises x2/day and reports others believe he's made improvement. Completed PHoRTE exercises. Required cueing to rduce tight/strained voice, as well as to slow down for adequate breath support. Pt was able to produce "Over the fence" voice sentences with more ease, rating it a 4/10 in effort level required. Pt completed authoritative voice sentences with more difficulty, rating it a 6/10 in effort level required.   Pt felt doing a sigh vs. Waterfall was easier to complete with less tension. SLP in agreement.   05/31/22: Pt entered with his own folder today; "SEE I TOLD YOU I'D REMEMBER IT!" He had other things that he did each day written on his calendar as well. SLP introduced PHOrTE exercises to pt today (see pt instructions) and worked with him to achieve adequate breath prior  to each rep (pt would rush at times and not achieve adequate breath support). SLP provided Vernon [name] with detailed instructions and also educated wife, alone in lobby at the time, on each exercise. No throat clearing the last two sessions.  05/28/22: Pt provided very detailed explanation regarding of his nephrology appointment last week, and about his plans for tomorrow with family. He stated next session he will bring his folder, instead of relying on Vernon [name]. SLP worked with pt on his voice exercises, requiring min A occasionally for strong voice without squeezing, and min A usually for abdominal breathing. Pt's loud /a/ was produced with WNL voicing 50%.       PATIENT EDUCATION: Education details: See "treatment"  above Person educated: Patient, wife Education method: Explanation, Demonstration, Verbal cues Education comprehension: verbalized understanding, returned demonstration, verbal cues required, and needs further education      GOALS: Goals reviewed with patient? Yes   SHORT TERM GOALS: Target date: 05/31/22 Pt will complete HEP for voice/dysarthria with occasional min A over 2 sessions Baseline: 05/24/22 Goal status: Met   2.  Pt will use external aids to recall daily events/schedule with occasional min A from spouse over 1 week Baseline:  Goal status: Deferred   3.  Pt will achieve clear phonation 18/20 sentences in structured task Baseline:  Goal status: Not met   4.  Pt will ID dysnomic errors and self correct with rare min A over 2 sessions Baseline:  Goal status: Deferred     LONG TERM GOALS: Target date: 07/26/22 Pt will achieve clear phonation over 10 minute conversation with rare min A over 2 sessions Baseline:  Goal status: Revised   2.  Pt will independently recall weekly/daily schedule, appointments events and pertinent information with memory aids over 2 sessions Baseline:  Goal status: Deferred   3.  Pt will complete vocal warm ups and follow 3 vocal hygiene strategies to return to singing for 5-10 minute periods with rare min A Baseline:  Goal status: Ongoing   4.  Improve score on Cognitive function PROM by 4 points Baseline: 101- filled out by spouse Goal status: Ongoing   ASSESSMENT:   CLINICAL IMPRESSION: Patient is a 76 y.o. male who is seen for dysarthria/voice and cognitive linguistics s/p CVA. See tx note. Pt brought book in today and req'd cues that HEP was in front pocket where it always is left. Kyon is a Therapist, nutritional and sings for fun and at church. If pt's voice does not show marked improvement in 4 weeks, ENT consult may be requested/suggested. I recommend cont'd skilled ST to maximize voice and cognition for safety, to reduce caregiver burden and  QOL.    OBJECTIVE IMPAIRMENTS include attention, memory, awareness, dysarthria, and voice disorder. These impairments are limiting patient from managing appointments, household responsibilities, and effectively communicating at home and in community. Factors affecting potential to achieve goals and functional outcome are ability to learn/carryover information.. Patient will benefit from skilled SLP services to address above impairments and improve overall function.   REHAB POTENTIAL: Good   PLAN: SLP FREQUENCY: 2x/week   SLP DURATION: 12 weeks   PLANNED INTERVENTIONS: Language facilitation, Environmental controls, Cueing hierachy, Cognitive reorganization, Internal/external aids, Functional tasks, Multimodal communication approach, and SLP instruction and feedback         [place], [place] 06/14/2022, 5:20 PM

## 2022-06-14 NOTE — Therapy (Signed)
OUTPATIENT PHYSICAL THERAPY TREATMENT NOTE   Patient Name: Vernon [name] MRN: [id number] DOB:06/11/1946, 76 y.o., male Today's Date: 06/14/2022  PCP: Baxter Hire, MD  REFERRING PROVIDER: Baxter Hire, MD   END OF SESSION:   PT End of Session - 06/14/22 1433     Visit Number 11    Number of Visits 16    Date for PT Re-Evaluation 07/03/22    Authorization Type [employer] Medicare    Progress Note Due on Visit 19    PT Start Time 1456    PT Stop Time 1535    PT Time Calculation (min) 39 min    Equipment Utilized During Treatment Gait belt    Activity Tolerance Patient tolerated treatment well    Behavior During Therapy [facility] for tasks assessed/performed;Impulsive                Past Medical History:  Diagnosis Date   Anemia    Aortic stenosis    Arthritis    Complication of anesthesia    hard time getting bp up after knee replacement   Diabetes mellitus without complication (HCC)    GERD (gastroesophageal reflux disease)    occ tums prn   History of hiatal hernia    Hypertension    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[place])    Past Surgical History:  Procedure Laterality Date   CARPAL TUNNEL RELEASE  2012   CRANIOTOMY N/A 02/10/2022   Procedure: SUBOCCIPITAL CRANIECTOMY FOR EVACUATION OF CEREBELLAR HEMATOMA;  Surgeon: Kristeen Miss, MD;  Location: [place];  Service: Neurosurgery;  Laterality: N/A;   JOINT REPLACEMENT Right 2010   SHOULDER ARTHROSCOPY WITH ROTATOR CUFF REPAIR AND OPEN BICEPS TENODESIS Right 11/09/2019   Procedure: RIGHT SHOULDER ARTHROSCOPY WITH SUBSCAPULARIS REPAIR, SUBACROMIAL DECOMPRESSION,MINI OPEN ROTATOR CUFF REPAIR;  Surgeon: Leim Fabry, MD;  Location: [facility];  Service: Orthopedics;  Laterality: Right;   Patient Active Problem List   Diagnosis Date Noted   Proteinuria, unspecified 05/23/2022   Simple renal cyst 05/23/2022   Long term current use of anticoagulant 04/04/2022   Rotator cuff syndrome 04/04/2022   Exposure to potentially  hazardous substance 04/04/2022   Gout 04/04/2022   Osteoarthritis 04/04/2022   Osteoporosis 04/04/2022   Other specified diseases of hair and hair follicles 63/84/6659   Pain in joint, lower leg 04/04/2022   Problem related to unspecified psychosocial circumstances 04/04/2022   General medical examination for administrative purposes 04/04/2022   Stage 3b chronic kidney disease ([place])    Dysphagia, post-stroke    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 hemorrhage of brain ([place]) 02/26/2022   Cough 93/57/0177   Acute metabolic encephalopathy 93/90/3009   Obstructive hydrocephalus ([place]) 02/19/2022   Dyslipidemia 02/19/2022   Dysphagia 02/19/2022   AKI (acute kidney injury) ([place])    Hyperkalemia    Anemia    Pleural effusion on right    Respiratory failure requiring intubation (HCC)    Cerebral edema (HCC)    Chronic atrial fibrillation ([place])    Controlled type 2 diabetes mellitus with hyperglycemia, without long-term current use of insulin (HCC)    ICH (intracerebral hemorrhage) ([place]) 02/10/2022   Intracranial hemorrhage (HCC)    Anticoagulated    Moderate tricuspid regurgitation 07/26/2021   Moderate aortic valve stenosis 03/08/2020   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[place]) 02/18/2020   Macrocytic anemia 01/13/2020   Spleen enlargement 01/13/2020   Bilateral carotid artery stenosis 07/14/2018   Atrial fibrillation, chronic ([place]) 07/14/2018   Type 2 diabetes with nephropathy ([place]) 02/14/2016   Essential hypertension 08/16/2014  Hyperlipemia, mixed 08/16/2014   Renal insufficiency 08/16/2014    REFERRING DIAG: I62.9 (ICD-10-CM) - Nontraumatic intracranial hemorrhage, unspecified   THERAPY DIAG:  Unsteadiness on feet  Other abnormalities of gait and mobility  Muscle weakness (generalized)  Rationale for Evaluation and Treatment Rehabilitation  PERTINENT HISTORY: see MAR  PRECAUTIONS: fall risk  SUBJECTIVE: No changes since last visit  PAIN:  Are you having pain? No pain in sitting; 3/10  pain with walking in L knee  OBJECTIVE:    TODAY'S TREATMENT: 06/14/2022 Activity Comments  Standing hip abduction 2 x 10 reps with 2.5# weights   Standing hip extension 2 x 10 reps, 2.5# weights   Marching in place 10 reps, 2.5 # weights Cues for wider BOS foot placement at midline  Resisted forward walking in parallel bars, then backward walk no resistance with cues for wider BOS, blue theraband at waist, 3 reps   Standing wide BOS lateral weightshifting to L side with blue theraband resistance   TUG shuttle (sit to stand, 10 ft gait, then turn to sit); performed x 6 reps with RW; short distance gait in clinic, 20-30 ft using clinic RW with SBA   Short distance gait with pt's RW:  cues for upright posture through hips, lessening UE support through hands, heelstrike with gait Veers to L at times  Standing on solid ground/then Airex: -head turns/head nods x 10 EO -EO and EC head steady x 30 sec Intermittent UE support throughout with standing on Airex (performed at RW)  Standing at RW: -tandem stance 15 sec, then partial tandem stance 15 sec -SLS 2 x 10 sec, UE support at RW -LLE as SLS with RLE step taps forward>side>back, 5 reps Increased left lateral lean with L foot in posterior position with tandem stance    PATIENT EDUCATION: Education details: Larger wheels for his current RW (has small wheels on it, and larger wheels may help increase steadiness) Person educated: Patient Education method: Explanation Education comprehension: verbalized understanding   -------------------------------------------------------------------------------------------------------------------------   From eval  DIAGNOSTIC FINDINGS: see MAR   COGNITION: Overall cognitive status: Within functional limits for tasks assessed             SENSATION: WFL   COORDINATION: Bradykinesia, difficulty wit rapid alternating movements     MUSCLE TONE: WFL       POSTURE: No Significant postural  limitations   LOWER EXTREMITY ROM:      WFL  (Blank rows = not tested)   LOWER EXTREMITY MMT:     MMT Right Eval Left Eval  Hip flexion 5 4  Hip extension 5 3+  Hip abduction 5 3-  Hip adduction      Hip internal rotation      Hip external rotation      Knee flexion 5 4  Knee extension 5 4  Ankle dorsiflexion 5 4+  Ankle plantarflexion      Ankle inversion      Ankle eversion      (Blank rows = not tested)   BED MOBILITY:  Independent   TRANSFERS: Assistive device utilized: Environmental consultant - 2 wheeled  Sit to stand: Modified independence Stand to sit: Modified independence Chair to chair: SBA and CGA Floor:  DNT   RAMP:  Not available   CURB:  Level of Assistance: SBA and CGA Assistive device utilized: Environmental consultant - 2 wheeled Curb Comments: undershoots with LLE   STAIRS:           Level of Assistance: SBA and CGA  Stair Negotiation Technique: Alternating Pattern  with Bilateral Rails           Number of Stairs: 6             Height of Stairs: 4-6"            Comments: dysmetria w/ LLE   GAIT: Gait pattern: circumduction- Left and ataxic Distance walked: 100 Assistive device utilized: Walker - 2 wheeled Level of assistance: SBA Comments: dysmetria LLE   FUNCTIONAL TESTs:  Timed up and go (TUG): 20.25 with RW Berg Balance Scale: 35/56      M-CTSIB  Condition 1: Firm Surface, EO 30 Sec, Mild Sway  Condition 2: Firm Surface, EC 30 Sec, Mild and Moderate Sway  Condition 3: Foam Surface, EO 9 Sec, Severe Sway  Condition 4: Foam Surface, EC 0 Sec,  unable  Sway    PATIENT SURVEYS:  FOTO 54.43% functional status   TODAY'S TREATMENT:  Evaluation, sidestepping along counter, tandem stance at counter     PATIENT EDUCATION: Education details: scope of PT intervention Person educated: Patient and Spouse Education method: Explanation Education comprehension: verbalized understanding     HOME EXERCISE PROGRAM: sidestepping along counter, tandem stance  at counter   -----------------------------------------------------------------------------------------------------------------------    GOALS: Goals reviewed with patient? Yes   SHORT TERM GOALS: Target date: 05/29/2022   Patient will perform HEP with family/caregiver supervision for improved strength, balance, transfers, and gait  Baseline: Goal status: MET   2.  Patient will achieve 15 seconds for TUG test to manifest reduced risk for falls Baseline: 20.25 with RW; 14 sec with RW Goal status: MET   3. Demo modified independent gait level surfaces and curb negotiation            Baseline: CGA-SBA with RW            Goal status: On-going   LONG TERM GOALS: Target date: 06/26/2022   Patient will demonstrate score 45/56 Berg Balance Test to manifest low risk for falls Baseline: 35/56; 37/56 on 06/06/22 Goal status: On-going   2.  Demonstrate improved static balance and postural control per time of 15 sec condition 4 M-CTSIB to improve safety with mobility on uneven surfaces Baseline: complete LOB left with condition 3 Goal status: On-going   3.  Demo modified independent gait using least restrictive AD over various surfaces and ascend/descend stairs with set-up assist to improve functional mobility Baseline:  Goal status: on-going   4.  Demo score of 57% functional status FOTO  Baseline: 54.43% Goal status: INITIAL       ASSESSMENT:   CLINICAL IMPRESSION: Continued focus in skilled PT session today on functional lower extremity strength through LLE and control of RLE movements when LLE is the stance limb.  Focused on slowed pace, increased control of movement patterns with transfers, turns with gait.  He continues to benefit from cues, as when cues are removed, he demonstrates decreased controlled motion.  He does note increased LLE fatigue with SLS and compliant surface activities today.  He will continue to benefit from skilled PT towards goals for improved overall functional  mobility and decreased fall risk.   OBJECTIVE IMPAIRMENTS Abnormal gait, decreased activity tolerance, decreased balance, decreased coordination, decreased endurance, decreased knowledge of use of DME, difficulty walking, decreased strength, impaired perceived functional ability, and improper body mechanics.    ACTIVITY LIMITATIONS carrying, lifting, bending, standing, squatting, stairs, transfers, and locomotion level   PARTICIPATION LIMITATIONS: meal prep, cleaning, laundry, driving, shopping, community activity, and  yard work   PERSONAL FACTORS Age, Fitness, and Time since onset of injury/illness/exacerbation are also affecting patient's functional outcome.    REHAB POTENTIAL: Excellent   CLINICAL DECISION MAKING: Evolving/moderate complexity   EVALUATION COMPLEXITY: Moderate   PLAN: PT FREQUENCY: 1-2x/week   PT DURATION: 9 weeks   PLANNED INTERVENTIONS: Therapeutic exercises, Therapeutic activity, Neuromuscular re-education, Balance training, Gait training, Patient/Family education, Joint mobilization, Stair training, Vestibular training, Canalith repositioning, DME instructions, Aquatic Therapy, Electrical stimulation, Wheelchair mobility training, and Manual therapy   PLAN FOR NEXT SESSION:  Functional lower extremity strengthening, controlled movement patterns with initiation of gait and turns; training with gait in/out of clinic to negotiate curbs and sidewalk.                  Mady Haagensen, PT 06/14/22 4:30 PM Phone: [phone] Fax: [phone]   [facility] at [facility] [place], [name], [place] [address] Phone # [phone] Fax # [phone]

## 2022-06-18 ENCOUNTER — Ambulatory Visit: Payer: Medicare Other | Admitting: Physical Therapy

## 2022-06-20 NOTE — Progress Notes (Signed)
Electrophysiology Office Note:    Date:  06/21/2022   ID:  Vernon [name], DOB November 27, 1945, MRN [id number]  PCP:  Baxter Hire, MD  [facility] Cardiologist:  None  [facility] Electrophysiologist:  Vickie Epley, MD   Referring MD: Baxter Hire, MD   Chief Complaint: New patient consult for Watchman  History of Present Illness:    Vernon [name] is a 76 y.o. male who presents for an evaluation for Watchman at the request of Dr. Nehemiah Massed. Their medical history includes aortic stenosis, hypertension, anemia, myelodysplastic syndrome, diabetes, and GERD.  He was hospitalized in 01/2022 for intracranial hemorrhage requiring surgical evacuation. Due to his chronic atrial fibrillation he was restarted on Eliquis. He was in the hospital for 5.5 weeks completing in-house rehab.  On 05/17/22 he expressed concern with bleeding risks associated with Eliquis and wished to discuss alternatives to anticoagulation. He was referred to EP for further consideration and discussion of the Watchman procedure.  Today, he states he is feeling pretty good.   He is scheduled to follow up with Dr. Nehemiah Massed in October.  They deny any palpitations, chest pain, shortness of breath, or peripheral edema. No lightheadedness, headaches, syncope, orthopnea, or PND.     Past Medical History:  Diagnosis Date   Anemia    Aortic stenosis    Arthritis    Complication of anesthesia    hard time getting bp up after knee replacement   Diabetes mellitus without complication (HCC)    GERD (gastroesophageal reflux disease)    occ tums prn   History of hiatal hernia    Hypertension    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[place])     Past Surgical History:  Procedure Laterality Date   CARPAL TUNNEL RELEASE  2012   CRANIOTOMY N/A 02/10/2022   Procedure: SUBOCCIPITAL CRANIECTOMY FOR EVACUATION OF CEREBELLAR HEMATOMA;  Surgeon: Kristeen Miss, MD;  Location: [place];  Service: Neurosurgery;  Laterality: N/A;    JOINT REPLACEMENT Right 2010   SHOULDER ARTHROSCOPY WITH ROTATOR CUFF REPAIR AND OPEN BICEPS TENODESIS Right 11/09/2019   Procedure: RIGHT SHOULDER ARTHROSCOPY WITH SUBSCAPULARIS REPAIR, SUBACROMIAL DECOMPRESSION,MINI OPEN ROTATOR CUFF REPAIR;  Surgeon: Leim Fabry, MD;  Location: [facility];  Service: Orthopedics;  Laterality: Right;    Current Medications: Current Meds  Medication Sig   apixaban (ELIQUIS) 5 MG TABS tablet Take 5 mg by mouth 2 (two) times daily.    metFORMIN (GLUCOPHAGE) 500 MG tablet Take 1 tablet by mouth 2 (two) times daily with a meal.     Allergies:   Patient has no known allergies.   Social History   Socioeconomic History   Marital status: Married    Spouse name: Not on file   Number of children: Not on file   Years of education: Not on file   Highest education level: Not on file  Occupational History   Not on file  Tobacco Use   Smoking status: Former    Packs/day: 0.50    Years: 8.00    Total pack years: 4.00    Types: Cigarettes    Quit date: 07/21/1978    Years since quitting: 43.9   Smokeless tobacco: Never  Vaping Use   Vaping Use: Never used  Substance and Sexual Activity   Alcohol use: Yes    Alcohol/week: 0.0 - 1.0 standard drinks of alcohol    Comment: rare beer   Drug use: Never   Sexual activity: Not on file  Other Topics Concern   Not  on file  Social History Narrative   Lives in [place]; with wife; quit smoking in early 24s; ocassional/ rare [may be 1 a month beer]. 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    Social Determinants of Health   Financial Resource Strain: Not on file  Food Insecurity: Not on file  Transportation Needs: Not on file  Physical Activity: Not on file  Stress: Not on file  Social Connections: Not on file     Family History: The patient's family history includes Cancer in his maternal uncle.  ROS:   Please see the history of present illness.     All other systems reviewed and are  negative.  EKGs/Labs/Other Studies Reviewed:    The following studies were reviewed today:  02/19/2022  Left UE Venous Doppler: Summary:     Right:  No evidence of thrombosis in the subclavian.     Left:  No evidence of deep vein thrombosis in the upper extremity. No evidence of superficial vein thrombosis in the upper extremity.   02/11/2022  Echocardiogram: Sonographer Comments: Echo performed with patient supine and on artificial  respirator.  IMPRESSIONS     1. Left ventricular ejection fraction, by estimation, is 55 to 60%. The  left ventricle has normal function. The left ventricle has no regional  wall motion abnormalities. There is moderate left ventricular hypertrophy.  Left ventricular diastolic  parameters are indeterminate.   2. Right ventricular systolic function is normal. The right ventricular  size is normal. There is moderately elevated pulmonary artery systolic  pressure.   3. Left atrial size was severely dilated.   4. Right atrial size was mild to moderately dilated.   5. A small pericardial effusion is present. There is no evidence of  cardiac tamponade.   6. Mild mitral valve regurgitation.   7. There is moderate calcification of the aortic valve. Aortic valve  regurgitation is not visualized. Mild aortic valve stenosis. Aortic valve  area, by VTI measures 1.03 cm. Aortic valve mean gradient measures 19.0  mmHg.   8. The inferior vena cava is dilated in size with <50% respiratory  variability, suggesting right atrial pressure of 15 mmHg.    EKG:   EKG is personally reviewed.    Recent Labs: 02/12/2022: B Natriuretic Peptide 854.2 02/21/2022: Magnesium 1.9 02/27/2022: ALT 24 04/04/2022: BUN 65; Creatinine, Ser 1.47; Platelets 313; Potassium 4.4; Sodium 134 06/13/2022: Hemoglobin 8.9   Recent Lipid Panel    Component Value Date/Time   CHOL 68 02/11/2022 0423   TRIG 46 02/14/2022 0450   HDL 27 (L) 02/11/2022 0423   CHOLHDL 2.5 02/11/2022 0423    VLDL 16 02/11/2022 0423   LDLCALC 25 02/11/2022 0423    Physical Exam:    VS:  BP 130/70   Pulse 60   Ht '5\' 9"'$  (1.753 m)   Wt 202 lb (91.6 kg)   BMI 29.83 kg/m     Wt Readings from Last 3 Encounters:  06/21/22 202 lb (91.6 kg)  06/07/22 205 lb (93 kg)  04/04/22 193 lb (87.5 kg)     GEN: Well nourished, well developed in no acute distress HEENT: Normal NECK: No JVD; No carotid bruits LYMPHATICS: No lymphadenopathy CARDIAC: RRR, 2 out of 6 crescendo decrescendo systolic ejection murmur at the upper sternal borders.  No rubs, gallops RESPIRATORY:  Clear to auscultation without rales, wheezing or rhonchi  ABDOMEN: Soft, non-tender, non-distended MUSCULOSKELETAL:  No edema; No deformity  SKIN: Warm and dry NEUROLOGIC:  Alert and oriented x  3 PSYCHIATRIC:  Normal affect       ASSESSMENT:    1. Atrial fibrillation, chronic ([place])   2. 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 hemorrhage of brain (HCC)    PLAN:    In order of problems listed above:  #Chronic atrial fibrillation Back on Eliquis for stroke prophylaxis.  History of intracranial hemorrhage in March 2023 requiring craniectomy.  He has done remarkably well with rehab and is interested in pursuing left atrial appendage occlusion as a mechanism to avoid long-term exposure to anticoagulation.  I think he is overall a great candidate for the procedure.  I have seen [name] in the office today who is being considered for a Watchman left atrial appendage closure device. I believe they will benefit from this procedure given their history of atrial fibrillation, CHA2DS2-VASc score of at least 4 and unadjusted ischemic stroke rate of at least 4.8% per year. Unfortunately, the patient is not felt to be a long term anticoagulation candidate secondary to a history of intracranial hemorrhage. The patient's chart has been reviewed and I feel that they would be a candidate for short term oral anticoagulation after Watchman implant.   It is my belief  that after undergoing a LAA closure procedure, Vernon [name] will not need long term anticoagulation which eliminates anticoagulation side effects and major bleeding risk.   Procedural risks for the Watchman implant have been reviewed with the patient including a 0.5% risk of stroke, <1% risk of perforation and <1% risk of device embolization. Other risks include bleeding, vascular damage, tamponade, worsening renal function, and death. The patient understands these risk and wishes to proceed.     The published clinical data on the safety and effectiveness of WATCHMAN include but are not limited to the following: - Holmes DR, Mechele Claude, Sick P et al. for the PROTECT AF Investigators. Percutaneous closure of the left atrial appendage versus warfarin therapy for prevention of stroke in patients with atrial fibrillation: a randomised non-inferiority trial. Lancet 2009; 374: 534-42. Mechele Claude, Doshi SK, Abelardo Diesel D et al. on behalf of the PROTECT AF Investigators. Percutaneous Left Atrial Appendage Closure for Stroke Prophylaxis in Patients With Atrial Fibrillation 2.3-Year Follow-up of the PROTECT AF (Watchman Left Atrial Appendage System for Embolic Protection in Patients With Atrial Fibrillation) Trial. Circulation 2013; 127:720-729. - Alli O, Doshi S,  Kar S, Reddy VY, Sievert H et al. Quality of Life Assessment in the Randomized PROTECT AF (Percutaneous Closure of the Left Atrial Appendage Versus Warfarin Therapy for Prevention of Stroke in Patients With Atrial Fibrillation) Trial of Patients at Risk for Stroke With Nonvalvular Atrial Fibrillation. J Am Coll Cardiol 2013; 45:8099-8. Vertell Limber DR, Tarri Abernethy, Price M, [place], Sievert H, Doshi S, Huber K, Reddy V. Prospective randomized evaluation of the Watchman left atrial appendage Device in patients with atrial fibrillation versus long-term warfarin therapy; the PREVAIL trial. Journal of the [employer] of Cardiology, Vol. 4, No. 1, 2014,  1-11. - Kar S, Doshi SK, Sadhu A, Horton R, Osorio J et al. Primary outcome evaluation of a next-generation left atrial appendage closure device: results from the PINNACLE FLX trial. Circulation 2021;143(18)1754-1762.    After today's visit with the patient which was dedicated solely for shared decision making visit regarding LAA closure device, the patient decided to proceed with the LAA appendage closure procedure scheduled to be done in the near future at [facility]. Prior to the procedure, I would like to obtain a gated CT scan of  the chest with contrast timed for PV/LA visualization.    HAS-BLED score 3 Hypertension Yes  Abnormal renal and liver function (Dialysis, transplant, Cr >2.26 mg/dL /Cirrhosis or Bilirubin >2x Normal or AST/ALT/AP >3x Normal) No  Stroke No  Bleeding Yes  Labile INR (Unstable/high INR) No  Elderly (>65) Yes  Drugs or alcohol (? 8 drinks/week, anti-plt or NSAID) No   CHA2DS2-VASc Score = 4  The patient's score is based upon: CHF History: 0 HTN History: 1 Diabetes History: 1 Stroke History: 0 Vascular Disease History: 0 Age Score: 2 Gender Score: 0    Total time spent with patient today 60 minutes. This includes reviewing records, evaluating the patient and coordinating care.  Medication Adjustments/Labs and Tests Ordered: Current medicines are reviewed at length with the patient today.  Concerns regarding medicines are outlined above.  No orders of the defined types were placed in this encounter.  No orders of the defined types were placed in this encounter.   I,Mathew Stumpf,acting as a Education administrator for Vickie Epley, MD.,have documented all relevant documentation on the behalf of Vickie Epley, MD,as directed by  Vickie Epley, MD while in the presence of Vickie Epley, MD.  I, Vickie Epley, MD, have reviewed all documentation for this visit. The documentation on 06/21/22 for the exam, diagnosis, procedures, and orders are  all accurate and complete.   Signed, Hilton Cork. Quentin Ore, MD, [facility], [facility] 06/21/2022 11:04 AM    Electrophysiology [facility]

## 2022-06-21 ENCOUNTER — Ambulatory Visit: Payer: Medicare Other | Admitting: Occupational Therapy

## 2022-06-21 ENCOUNTER — Encounter: Payer: Self-pay | Admitting: *Deleted

## 2022-06-21 ENCOUNTER — Ambulatory Visit: Payer: Medicare Other | Admitting: Cardiology

## 2022-06-21 ENCOUNTER — Ambulatory Visit: Payer: Medicare Other

## 2022-06-21 ENCOUNTER — Encounter: Payer: Self-pay | Admitting: Cardiology

## 2022-06-21 VITALS — BP 130/70 | HR 60 | Ht 69.0 in | Wt 202.0 lb

## 2022-06-21 DIAGNOSIS — I619 Nontraumatic intracerebral hemorrhage, unspecified: Secondary | ICD-10-CM

## 2022-06-21 DIAGNOSIS — R2681 Unsteadiness on feet: Secondary | ICD-10-CM

## 2022-06-21 DIAGNOSIS — I69254 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: Secondary | ICD-10-CM

## 2022-06-21 DIAGNOSIS — M6281 Muscle weakness (generalized): Secondary | ICD-10-CM

## 2022-06-21 DIAGNOSIS — R41841 Cognitive communication deficit: Secondary | ICD-10-CM

## 2022-06-21 DIAGNOSIS — R278 Other lack of coordination: Secondary | ICD-10-CM

## 2022-06-21 DIAGNOSIS — R2689 Other abnormalities of gait and mobility: Secondary | ICD-10-CM

## 2022-06-21 DIAGNOSIS — I482 Chronic atrial fibrillation, unspecified: Secondary | ICD-10-CM

## 2022-06-21 DIAGNOSIS — R262 Difficulty in walking, not elsewhere classified: Secondary | ICD-10-CM

## 2022-06-21 DIAGNOSIS — R471 Dysarthria and anarthria: Secondary | ICD-10-CM

## 2022-06-21 LAB — BASIC METABOLIC PANEL
BUN/Creatinine Ratio: 24 (ref 10–24)
BUN: 34 mg/dL — ABNORMAL HIGH (ref 8–27)
CO2: 21 mmol/L (ref 20–29)
Calcium: 10 mg/dL (ref 8.6–10.2)
Chloride: 106 mmol/L (ref 96–106)
Creatinine, Ser: 1.44 mg/dL — ABNORMAL HIGH (ref 0.76–1.27)
Glucose: 135 mg/dL — ABNORMAL HIGH (ref 70–99)
Potassium: 4.8 mmol/L (ref 3.5–5.2)
Sodium: 143 mmol/L (ref 134–144)
eGFR: 50 mL/min/{1.73_m2} — ABNORMAL LOW (ref >59)

## 2022-06-21 NOTE — Therapy (Signed)
OUTPATIENT PHYSICAL THERAPY TREATMENT NOTE   Patient Name: Vernon [name] MRN: [id number] DOB:Mar 30, 1946, 76 y.o., male Today's Date: 06/21/2022  PCP: Gracelyn Nurse, MD  REFERRING PROVIDER: Gracelyn Nurse, MD   END OF SESSION:   PT End of Session - 06/21/22 1534     Visit Number 12    Number of Visits 16    Date for PT Re-Evaluation 07/03/22    Authorization Type U[employer] Medicare    Progress Note Due on Visit 19    PT Start Time 1530    PT Stop Time 1615    PT Time Calculation (min) 45 min    Equipment Utilized During Treatment Gait belt    Activity Tolerance Patient tolerated treatment well    Behavior During Therapy [facility] for tasks assessed/performed;Impulsive                Past Medical History:  Diagnosis Date   Anemia    Aortic stenosis    Arthritis    Complication of anesthesia    hard time getting bp up after knee replacement   Diabetes mellitus without complication (HCC)    GERD (gastroesophageal reflux disease)    occ tums prn   History of hiatal hernia    Hypertension    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HCC)    Past Surgical History:  Procedure Laterality Date   CARPAL TUNNEL RELEASE  2012   CRANIOTOMY N/A 02/10/2022   Procedure: SUBOCCIPITAL CRANIECTOMY FOR EVACUATION OF CEREBELLAR HEMATOMA;  Surgeon: Barnett Abu, MD;  Location: [facility];  Service: Neurosurgery;  Laterality: N/A;   JOINT REPLACEMENT Right 2010   SHOULDER ARTHROSCOPY WITH ROTATOR CUFF REPAIR AND OPEN BICEPS TENODESIS Right 11/09/2019   Procedure: RIGHT SHOULDER ARTHROSCOPY WITH SUBSCAPULARIS REPAIR, SUBACROMIAL DECOMPRESSION,MINI OPEN ROTATOR CUFF REPAIR;  Surgeon: Signa Kell, MD;  Location: [facility] ORS;  Service: Orthopedics;  Laterality: Right;   Patient Active Problem List   Diagnosis Date Noted   Proteinuria, unspecified 05/23/2022   Simple renal cyst 05/23/2022   Long term current use of anticoagulant 04/04/2022   Rotator cuff syndrome 04/04/2022   Exposure to potentially  hazardous substance 04/04/2022   Gout 04/04/2022   Osteoarthritis 04/04/2022   Osteoporosis 04/04/2022   Other specified diseases of hair and hair follicles 04/04/2022   Pain in joint, lower leg 04/04/2022   Problem related to unspecified psychosocial circumstances 04/04/2022   General medical examination for administrative purposes 04/04/2022   Stage 3b chronic kidney disease (HCC)    Dysphagia, post-stroke    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   Cough 02/20/2022   Acute metabolic encephalopathy 02/20/2022   Obstructive hydrocephalus (HCC) 02/19/2022   Dyslipidemia 02/19/2022   Dysphagia 02/19/2022   AKI (acute kidney injury) (HCC)    Hyperkalemia    Anemia    Pleural effusion on right    Respiratory failure requiring intubation (HCC)    Cerebral edema (HCC)    Chronic atrial fibrillation (HCC)    Controlled type 2 diabetes mellitus with hyperglycemia, without long-term current use of insulin (HCC)    ICH (intracerebral hemorrhage) (HCC) 02/10/2022   Intracranial hemorrhage (HCC)    Anticoagulated    Moderate tricuspid regurgitation 07/26/2021   Moderate aortic valve stenosis 03/08/2020   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HCC) 02/18/2020   Macrocytic anemia 01/13/2020   Spleen enlargement 01/13/2020   Bilateral carotid artery stenosis 07/14/2018   Atrial fibrillation, chronic (HCC) 07/14/2018   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   Essential hypertension 08/16/2014  Hyperlipemia, mixed 08/16/2014   Renal insufficiency 08/16/2014    REFERRING DIAG: I62.9 (ICD-10-CM) - Nontraumatic intracranial hemorrhage, unspecified   THERAPY DIAG:  Muscle weakness (generalized)  Unsteadiness on feet  Other abnormalities of gait and mobility  Difficulty in walking, not elsewhere classified  Rationale for Evaluation and Treatment Rehabilitation  PERTINENT HISTORY: see MAR  PRECAUTIONS: fall risk  SUBJECTIVE: No changes since last visit  PAIN:   Are you having pain? No pain in sitting; 3/10 pain with walking in L knee  OBJECTIVE:    TODAY'S TREATMENT: 06/21/22 Activity Comments  Tall kneeling: performed on folded mat for knees treatment table in front for support Overhead press 6.6 lbs 3x10--tactile cues to support trunk extension, need for UE touch support on table 50% of the time  LAQ 3x10 5# RLE, 15# LLE  Standing Hip flexion 3x10 5# RLE, 15# LLE  Standing on foam 3x30 sec EO Head turns 5x  Gait training RW level surfaces x 150 ft cues for upright posture, braced core, slow-deliberate pace.  Gait training with head turns all directions while maintaining straight path  SCI-fit LE/UE cycling X5 min all extremities, 2x2 min standing UBE to promote pelvic rotation for limb advancement in gait  Transfer training various arrangements/seat heights SBA-CGA, safety awareness cues 25% of th etime    TODAY'S TREATMENT: 06/14/2022 Activity Comments  Standing hip abduction 2 x 10 reps with 2.5# weights   Standing hip extension 2 x 10 reps, 2.5# weights   Marching in place 10 reps, 2.5 # weights Cues for wider BOS foot placement at midline  Resisted forward walking in parallel bars, then backward walk no resistance with cues for wider BOS, blue theraband at waist, 3 reps   Standing wide BOS lateral weightshifting to L side with blue theraband resistance   TUG shuttle (sit to stand, 10 ft gait, then turn to sit); performed x 6 reps with RW; short distance gait in clinic, 20-30 ft using clinic RW with SBA   Short distance gait with pt's RW:  cues for upright posture through hips, lessening UE support through hands, heelstrike with gait Veers to L at times  Standing on solid ground/then Airex: -head turns/head nods x 10 EO -EO and EC head steady x 30 sec Intermittent UE support throughout with standing on Airex (performed at RW)  Standing at RW: -tandem stance 15 sec, then partial tandem stance 15 sec -SLS 2 x 10 sec, UE support at RW -LLE  as SLS with RLE step taps forward>side>back, 5 reps Increased left lateral lean with L foot in posterior position with tandem stance    PATIENT EDUCATION: Education details: Larger wheels for his current RW (has small wheels on it, and larger wheels may help increase steadiness) Person educated: Patient Education method: Explanation Education comprehension: verbalized understanding   -------------------------------------------------------------------------------------------------------------------------   From eval  DIAGNOSTIC FINDINGS: see MAR   COGNITION: Overall cognitive status: Within functional limits for tasks assessed             SENSATION: WFL   COORDINATION: Bradykinesia, difficulty wit rapid alternating movements     MUSCLE TONE: WFL       POSTURE: No Significant postural limitations   LOWER EXTREMITY ROM:      WFL  (Blank rows = not tested)   LOWER EXTREMITY MMT:     MMT Right Eval Left Eval  Hip flexion 5 4  Hip extension 5 3+  Hip abduction 5 3-  Hip adduction  Hip internal rotation      Hip external rotation      Knee flexion 5 4  Knee extension 5 4  Ankle dorsiflexion 5 4+  Ankle plantarflexion      Ankle inversion      Ankle eversion      (Blank rows = not tested)   BED MOBILITY:  Independent   TRANSFERS: Assistive device utilized: Environmental consultant - 2 wheeled  Sit to stand: Modified independence Stand to sit: Modified independence Chair to chair: SBA and CGA Floor:  DNT   RAMP:  Not available   CURB:  Level of Assistance: SBA and CGA Assistive device utilized: Environmental consultant - 2 wheeled Curb Comments: undershoots with LLE   STAIRS:           Level of Assistance: SBA and CGA           Stair Negotiation Technique: Alternating Pattern  with Bilateral Rails           Number of Stairs: 6             Height of Stairs: 4-6"            Comments: dysmetria w/ LLE   GAIT: Gait pattern: circumduction- Left and ataxic Distance walked:  100 Assistive device utilized: Walker - 2 wheeled Level of assistance: SBA Comments: dysmetria LLE   FUNCTIONAL TESTs:  Timed up and go (TUG): 20.25 with RW Berg Balance Scale: 35/56      M-CTSIB  Condition 1: Firm Surface, EO 30 Sec, Mild Sway  Condition 2: Firm Surface, EC 30 Sec, Mild and Moderate Sway  Condition 3: Foam Surface, EO 9 Sec, Severe Sway  Condition 4: Foam Surface, EC 0 Sec,  unable  Sway    PATIENT SURVEYS:  FOTO 54.43% functional status   TODAY'S TREATMENT:  Evaluation, sidestepping along counter, tandem stance at counter     PATIENT EDUCATION: Education details: scope of PT intervention Person educated: Patient and Spouse Education method: Explanation Education comprehension: verbalized understanding     HOME EXERCISE PROGRAM: sidestepping along counter, tandem stance at counter   -----------------------------------------------------------------------------------------------------------------------    GOALS: Goals reviewed with patient? Yes   SHORT TERM GOALS: Target date: 05/29/2022   Patient will perform HEP with family/caregiver supervision for improved strength, balance, transfers, and gait  Baseline: Goal status: MET   2.  Patient will achieve 15 seconds for TUG test to manifest reduced risk for falls Baseline: 20.25 with RW; 14 sec with RW Goal status: MET   3. Demo modified independent gait level surfaces and curb negotiation            Baseline: CGA-SBA with RW            Goal status: On-going   LONG TERM GOALS: Target date: 06/26/2022   Patient will demonstrate score 45/56 Berg Balance Test to manifest low risk for falls Baseline: 35/56; 37/56 on 06/06/22 Goal status: On-going   2.  Demonstrate improved static balance and postural control per time of 15 sec condition 4 M-CTSIB to improve safety with mobility on uneven surfaces Baseline: complete LOB left with condition 3 Goal status: On-going   3.  Demo modified independent gait  using least restrictive AD over various surfaces and ascend/descend stairs with set-up assist to improve functional mobility Baseline:  Goal status: on-going   4.  Demo score of 57% functional status FOTO  Baseline: 54.43% Goal status: INITIAL       ASSESSMENT:   CLINICAL IMPRESSION: Improved postural  stability with gait and performing head turns without LOB experienced vs pervious sessions where static standing and performing head turns would result in left LOB.  Progressing well with activity tolerance as well requiring fewer therapitic rest periods. Continued sessions indicated to enhance motor learning/control/coordination. Normalize gait pattern, improve dynamic balance, LLE strength to reduce risk for falls and facilitate return to independent ambulation/transfers.    OBJECTIVE IMPAIRMENTS Abnormal gait, decreased activity tolerance, decreased balance, decreased coordination, decreased endurance, decreased knowledge of use of DME, difficulty walking, decreased strength, impaired perceived functional ability, and improper body mechanics.    ACTIVITY LIMITATIONS carrying, lifting, bending, standing, squatting, stairs, transfers, and locomotion level   PARTICIPATION LIMITATIONS: meal prep, cleaning, laundry, driving, shopping, community activity, and yard work   PERSONAL FACTORS Age, Fitness, and Time since onset of injury/illness/exacerbation are also affecting patient's functional outcome.    REHAB POTENTIAL: Excellent   CLINICAL DECISION MAKING: Evolving/moderate complexity   EVALUATION COMPLEXITY: Moderate   PLAN: PT FREQUENCY: 1-2x/week   PT DURATION: 9 weeks   PLANNED INTERVENTIONS: Therapeutic exercises, Therapeutic activity, Neuromuscular re-education, Balance training, Gait training, Patient/Family education, Joint mobilization, Stair training, Vestibular training, Canalith repositioning, DME instructions, Aquatic Therapy, Electrical stimulation, Wheelchair mobility  training, and Manual therapy   PLAN FOR NEXT SESSION:  Functional lower extremity strengthening, controlled movement patterns with initiation of gait and turns; training with gait in/out of clinic to negotiate curbs and sidewalk.                  5:26 PM, 06/21/22 M. Sherlyn Lees, PT, DPT Physical Therapist- [place] Office Number: [phone]    [place] [facility] [address], [place], [place] [address] Phone # [phone] Fax # [phone]

## 2022-06-21 NOTE — Therapy (Signed)
OUTPATIENT OCCUPATIONAL THERAPY Treatment Note   Patient Name: Vernon [name] MRN: [id number] DOB:September 20, 1946, 76 y.o., male Today's Date: 06/21/2022  PCP: Baxter Hire, MD REFERRING PROVIDER: Baxter Hire, MD      OT End of Session - 06/21/22 1411     Visit Number 11    Number of Visits 17    Date for OT Re-Evaluation 07/06/22    Authorization Type UHC Medicare    Authorization Time Period VL: [place]    OT Start Time 1405    OT Stop Time 1445    OT Time Calculation (min) 40 min    Activity Tolerance Patient tolerated treatment well    Behavior During Therapy WFL for tasks assessed/performed               Past Medical History:  Diagnosis Date   Anemia    Aortic stenosis    Arthritis    Complication of anesthesia    hard time getting bp up after knee replacement   Diabetes mellitus without complication (HCC)    GERD (gastroesophageal reflux disease)    occ tums prn   History of hiatal hernia    Hypertension    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[place])    Past Surgical History:  Procedure Laterality Date   CARPAL TUNNEL RELEASE  2012   CRANIOTOMY N/A 02/10/2022   Procedure: SUBOCCIPITAL CRANIECTOMY FOR EVACUATION OF CEREBELLAR HEMATOMA;  Surgeon: Kristeen Miss, MD;  Location: [facility];  Service: Neurosurgery;  Laterality: N/A;   JOINT REPLACEMENT Right 2010   SHOULDER ARTHROSCOPY WITH ROTATOR CUFF REPAIR AND OPEN BICEPS TENODESIS Right 11/09/2019   Procedure: RIGHT SHOULDER ARTHROSCOPY WITH SUBSCAPULARIS REPAIR, SUBACROMIAL DECOMPRESSION,MINI OPEN ROTATOR CUFF REPAIR;  Surgeon: Leim Fabry, MD;  Location: [facility] OR[facility];  Service: Orthopedics;  Laterality: Right;   Patient Active Problem List   Diagnosis Date Noted   Proteinuria, unspecified 05/23/2022   Simple renal cyst 05/23/2022   Long term current use of anticoagulant 04/04/2022   Rotator cuff syndrome 04/04/2022   Exposure to potentially hazardous substance 04/04/2022   Gout 04/04/2022   Osteoarthritis  04/04/2022   Osteoporosis 04/04/2022   Other specified diseases of hair and hair follicles 55/73/2202   Pain in joint, lower leg 04/04/2022   Problem related to unspecified psychosocial circumstances 04/04/2022   General medical examination for administrative purposes 04/04/2022   Stage 3b chronic kidney disease ([place])    Dysphagia, post-stroke    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 hemorrhage of brain ([place]) 02/26/2022   Cough 54/27/0623   Acute metabolic encephalopathy 76/28/3151   Obstructive hydrocephalus ([place]) 02/19/2022   Dyslipidemia 02/19/2022   Dysphagia 02/19/2022   AKI (acute kidney injury) ([place])    Hyperkalemia    Anemia    Pleural effusion on right    Respiratory failure requiring intubation (HCC)    Cerebral edema (HCC)    Chronic atrial fibrillation ([place])    Controlled type 2 diabetes mellitus with hyperglycemia, without long-term current use of insulin (HCC)    ICH (intracerebral hemorrhage) ([place]) 02/10/2022   Intracranial hemorrhage (HCC)    Anticoagulated    Moderate tricuspid regurgitation 07/26/2021   Moderate aortic valve stenosis 03/08/2020   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[place]) 02/18/2020   Macrocytic anemia 01/13/2020   Spleen enlargement 01/13/2020   Bilateral carotid artery stenosis 07/14/2018   Atrial fibrillation, chronic ([place]) 07/14/2018   Type 2 diabetes with nephropathy ([place]) 02/14/2016   Essential hypertension 08/16/2014   Hyperlipemia, mixed 08/16/2014   Renal insufficiency 08/16/2014    ONSET DATE: 02/10/2022  REFERRING DIAG: I62.9 (ICD-10-CM) - Nontraumatic intracranial hemorrhage, unspecified  THERAPY DIAG:  Muscle weakness (generalized)  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   He states they are discussing have the watchmen procedure to reduce chances of clot and further strokes.  Pt accompanied by: self  PERTINENT HISTORY: cerebellar hemorrhage w/ shift and early hydrocephalus; emergent decompression surgery w/ Dr. Ellene Route on 02/11/22 w/ EVD pulled 02/14/22. PMH includes DMT2, HTN, HLD, atrial fibrillation on Eliquis, arthritis, anemia, and GERD  PAIN: Are you having pain? Yes, Aching shoulder 4/10.  Pt reports he may have slept wrong.  PATIENT GOALS: Be independent again; play guitar  OBJECTIVE:   TODAY'S TREATMENT:  Fine and gross motor coordination with flipping and sorting cards.  Pt demonstrating mild difficulty with flipping cards, however increased difficulty with matching cards from low to high due to increased gross motor movements.  Pt dropping 4/52 cards due to ataxic movements.  Pt with most difficulty when attempting to return small stacks to one large stack due to ataxia impacting gross motor control. Engaged in [place] pipe tree puzzle activity.  Engaged in reaching across midline and to L to obtain various pieces to complete puzzle.  Pt demonstrating increased ataxia when reaching across midline.  Pt also demonstrating some increased challenge due to quick movements and reactionary movements, such as when pieces are falling over.  Progressed to completing task in standing.  Therapist providing CGA for standing balance.  Pt with 1 LOB when reacting to dropping a PVC piece, requiring mod assist to correct. Engaged in [place] 4 in sitting with focus on fine and gross motor coordination with picking up small Connect 4 pieces and then placing them in grid with LUE.  Pt demonstrating significant improvements with picking up pieces and min ataxia when placing in Connect 4 grid.  Pt demonstrating good visual scanning and awareness to complete task per instructions.   PATIENT EDUCATION: Ongoing condition-specific education, particularly regarding neuro reed and  typical recovery patterns as well as benefit and goal of weight bearing/increased somatosensory input through affected UE Person educated: Patient Education method: Explanation Education comprehension: verbalized understanding   HOME EXERCISE PROGRAM: Coordination handout - see pt instructions  Access Code: [email or web address] URL: [email or web address] Date: 06/04/2022 Prepared by: [facility]  Exercises - Seated Shoulder Flexion with Dowel to 90  - 4 x weekly - 2-3 sets - 10 reps - Seated Chest Press with Bar  - 4 x weekly - 2-3 sets - 10 reps - Single Arm Shoulder Flexion with Dumbbell  - 4 x weekly - 2-3 sets - 10 reps - Seated Shoulder External Rotation with Dumbbell  - 4 x weekly - 2-3 sets - 10 reps   GOALS: Goals reviewed with patient? Yes  SHORT TERM GOALS: Target date: 06/08/22  STG  Status:  1 Pt will demonstrate independence w/ initial HEP designed for LUE GM and [place] Baseline: No HEP at this time Progressing  2 Pt will improve participation in functional bilateral FM tasks as evidenced by decreasing time to complete 9-Hole Peg Test w/ LUE by at least 6 sec Baseline: Right: 21.8  sec; Left: 51.2 sec Progressing - 06/04/22 51.66 sec 06/14/22 - 1:12.03 sec  3 Pt will be able to complete simulated  bilateral functional task (e.g. cutting food, manipulating clothing fasteners, washing dishes) within reasonable amount of time Baseline: Difficulty w/ bilateral tasks Progressing     LONG TERM GOALS: Target date: 07/06/22  LTG  Status:  1 Pt will improve participation in functional activities as evidenced by increasing FOTO score to 71 by d/c Baseline: 62 Progressing  2 Pt will demonstrate improved control and accuracy w/ LUE by completing 8 taps within 10 seconds during finger to nose test Baseline: Right: 12 taps in 10 sec; Left: 6 taps in 10 sec Progressing - 8 taps in 10 seconds (while stabilizing elbow at side)  3 Pt will improve  Box and Blocks score to at least 40 blocks by d/c to indicate improved unilateral GMC of LUE Baseline: Right 61 blocks, Left 29 blocks Progressing - 06/14/22 - 34 blocks  4 Pt will safely demonstrate simulated grilling activity, incorporating compensatory strategies/AE prn, w/ Mod I by d/c Baseline: Unable to complete at this time Progressing     ASSESSMENT:  CLINICAL IMPRESSION: Treatment session with focus on open chain movements, reaching across midline and outside BOS.  Pt demonstrates decreased impact of ataxia when able to increase proprioceptive input through UE by either closed chain on surface or stabilizing elbow along torso.  Pt continues to require min-mod cues for pacing and sequencing of movements both LUE and with ambulation.  Pt continues to demonstrate ataxia with both gross and fine motor tasks, benefiting from verbal cues for pacing, as decreased speed facilitates increased accuracy and success.   PERFORMANCE DEFICITS in functional skills including ADLs, IADLs, coordination, dexterity, sensation, strength, FMC, GMC, mobility, balance, body mechanics, and UE functional use.  IMPAIRMENTS are limiting patient from ADLs, IADLs, and leisure.   COMORBIDITIES has co-morbidities such as atrial fibrillation on Eliquis and DMT2  that affects occupational performance. Patient will benefit from skilled OT to address above impairments and improve overall function.  MODIFICATION OR ASSISTANCE TO COMPLETE EVALUATION: Min-Moderate modification of tasks or assist with assess necessary to complete an evaluation.  OT OCCUPATIONAL PROFILE AND HISTORY: Detailed assessment: Review of records and additional review of physical, cognitive, psychosocial history related to current functional performance.  CLINICAL DECISION MAKING: Moderate - several treatment options, min-mod task modification necessary  REHAB POTENTIAL: Good  EVALUATION COMPLEXITY: Moderate   PLAN: OT FREQUENCY: 2x/week  OT  DURATION: 8 weeks  PLANNED INTERVENTIONS: self care/ADL training, therapeutic exercise, therapeutic activity, neuromuscular re-education, manual therapy, functional mobility training, aquatic therapy, electrical stimulation, ultrasound, biofeedback, moist heat, cryotherapy, patient/family education, energy conservation, and DME and/or AE instructions  RECOMMENDED OTHER SERVICES: To receive PT/ST services at this location  CONSULTED AND AGREED WITH PLAN OF CARE: Patient and family member/caregiver  PLAN FOR NEXT SESSION:   GMC/FMC activities for LUE. Seated vs standing weight-bearing, closed-chain exercise, coordination activities, etc.  Functional in nature, if possible, to carry over to pt desire to play guitar again.   Simonne Come, OTR/L 06/21/2022, 2:11 PM

## 2022-06-21 NOTE — Patient Instructions (Addendum)
Medication Instructions:  Your physician recommends that you continue on your current medications as directed. Please refer to the Current Medication list given to you today. *If you need a refill on your cardiac medications before your next appointment, please call your pharmacy*  Lab Work: BMP If you have labs (blood work) drawn today and your tests are completely normal, you will receive your results only by: [place] (if you have MyChart) OR A paper copy in the mail If you have any lab test that is abnormal or we need to change your treatment, we will call you to review the results.  Testing/Procedures: Your physician has requested that you have cardiac CT. Cardiac computed tomography (CT) is a painless test that uses an x-ray machine to take clear, detailed pictures of your heart. For further information please visit [email or web address]. Please follow instruction sheet as given.  Your physician has requested that you have Left atrial appendage (LAA) closure device implantation is a procedure to put a small device in the LAA of the heart. The LAA is a small sac in the wall of the heart's left upper chamber. Blood clots can form in this area. The device, Watchman closes the LAA to help prevent a blood clot and stroke.   Follow-Up: At [facility], you and your health needs are our priority.  As part of our continuing mission to provide you with exceptional heart care, we have created designated Provider Care Teams.  These Care Teams include your primary Cardiologist (physician) and Advanced Practice Providers (APPs -  Physician Assistants and Nurse Practitioners) who all work together to provide you with the care you need, when you need it.  Your physician wants you to follow-up in: Lenice Llamas, the [facility] Nurse Navigator, will call you after your CT once the [facility] Team has reviewed your imaging for an update on proceedings. Katy's direct number is [phone] if you need  assistance.   We recommend signing up for the patient portal called "MyChart".  Sign up information is provided on this After Visit Summary.  MyChart is used to connect with patients for Virtual Visits (Telemedicine).  Patients are able to view lab/test results, encounter notes, upcoming appointments, etc.  Non-urgent messages can be sent to your provider as well.   To learn more about what you can do with MyChart, go to [email or web address].    Any Other Special Instructions Will Be Listed Below (If Applicable).  Left Atrial Appendage Closure Device Implantation  Left atrial appendage (LAA) closure device implantation is a procedure to put a small device in the LAA of the heart. The LAA is a small sac in the wall of the heart's left upper chamber. Blood clots can form in the LAA in people with atrial fibrillation (AFib). The device closes the LAA to help prevent a blood clot and stroke. AFib is a type of irregular or rapid heartbeat (arrhythmia). There is an increased risk of blood clots and stroke with AFib. This procedure helps to reduce that risk. Tell a health care provider about: Any allergies you have. All medicines you are taking, including vitamins, herbs, eye drops, creams, and over-the-counter medicines. Any problems you or family members have had with anesthetic medicines. Any blood disorders you have. Any surgeries you have had. Any medical conditions you have. Whether you are pregnant or may be pregnant. What are the risks? Generally, this is a safe procedure. However, problems may occur, including: Infection. Bleeding. Allergic reactions to medicines or dyes. Damage to nearby structures  or organs. Heart attack. Stroke. Blood clots. Changes in heart rhythm. Device failure. What happens before the procedure? Staying hydrated Follow instructions from your health care provider about hydration, which may include: Up to 2 hours before the procedure - you may continue to  drink clear liquids, such as water, clear fruit juice, black coffee, and plain tea. Eating and drinking restrictions Follow instructions from your health care provider about eating and drinking, which may include: 8 hours before the procedure - stop eating heavy meals or foods, such as meat, fried foods, or fatty foods. 6 hours before the procedure - stop eating light meals or foods, such as toast or cereal. 6 hours before the procedure - stop drinking milk or drinks that contain milk. 2 hours before the procedure - stop drinking clear liquids. Medicines Ask your health care provider about: Changing or stopping your regular medicines. This is especially important if you are taking diabetes medicines or blood thinners. Taking medicines such as aspirin and ibuprofen. These medicines can thin your blood. Do not take these medicines unless your health care provider tells you to take them. Taking over-the-counter medicines, vitamins, herbs, and supplements. Tests You may have blood tests and a physical exam. You may have an electrocardiogram (ECG). This test checks your heart's electrical patterns and rhythms. General instructions Do not use any products that contain nicotine or tobacco. These include cigarettes, chewing tobacco, and vaping devices, such as e-cigarettes. If you need help quitting, ask your health care provider. Ask your health care provider what steps will be taken to help prevent infection. These steps may include: Removing hair at the surgery site. Washing skin with a germ-killing soap. Taking antibiotic medicine. Plan to have a responsible adult take you home from the hospital or clinic. Plan to have a responsible adult care for you for the time you are told after you leave the hospital or clinic. This is important. What happens during the procedure? An IV will be inserted into one of your veins. You will be given one or more of the following: A medicine to help you relax  (sedative). A medicine to make you fall asleep (general anesthetic). A small incision will be made in your groin area. A small wire will be put through the incision and into a blood vessel. Dye may be injected so X-rays can be used to guide the wire through the blood vessel. A long, thin tube (catheter) will be put over the small wire and moved up through the blood vessel to reach your heart. The closure device will be moved through the catheter until it reaches your heart. A small hole will be made in the septum (transseptal puncture). The septum is a thin tissue that separates the upper two chambers of the heart. The device will be placed so that it closes the LAA. X-rays will be done to make sure the device is in the right place. The catheter and wire will be removed. The closure device will remain in your heart. After pressure is applied over the catheter site to prevent bleeding, a bandage (dressing) will be placed over the site where the catheter was inserted. The procedure may vary among health care providers and hospitals. What happens after the procedure? Your blood pressure, heart rate, breathing rate, and blood oxygen level will be monitored until you leave the hospital or clinic. You may have to wear compression stockings. These stockings help to prevent blood clots and reduce swelling in your legs. If you were given  a sedative during the procedure, it can affect you for several hours. Do not drive or operate machinery until your health care provider says it is safe. You may be given pain medicine. You may need to drink more fluids to wash (flush) the dye out of your body. Drink enough fluid to keep your urine pale yellow. Take over-the-counter and prescription medicines only as told by your health care provider. This is especially important if you were given blood thinners. Summary Left atrial appendage (LAA) closure device implantation is a procedure that is done to put a small  device in the LAA of the heart. The LAA is a small sac in the wall of the heart's left upper chamber. The device closes the LAA to prevent stroke and other problems. Follow instructions from your health care provider before and after the procedure. This information is not intended to replace advice given to you by your health care provider. Make sure you discuss any questions you have with your health care provider. Document Revised: 07/21/2020 Document Reviewed: 07/21/2020 Elsevier Patient Education  [place].

## 2022-06-21 NOTE — Therapy (Signed)
OUTPATIENT SPEECH LANGUAGE PATHOLOGY TREATMENT NOTE   Patient Name: Vernon [name] MRN: [id number] DOB:Sep 09, 1946, 76 y.o., male Today's Date: 06/21/2022  PCP: Baxter Hire, MD  REFERRING PROVIDER: Baxter Hire, MD   END OF SESSION:   End of Session - 06/21/22 1455     Visit Number 10    Number of Visits 25    Date for SLP Re-Evaluation 07/26/22    SLP Start Time 1450    SLP Stop Time  4098    SLP Time Calculation (min) 40 min    Activity Tolerance Patient tolerated treatment well                 Past Medical History:  Diagnosis Date   Anemia    Aortic stenosis    Arthritis    Complication of anesthesia    hard time getting bp up after knee replacement   Diabetes mellitus without complication ([place])    GERD (gastroesophageal reflux disease)    occ tums prn   History of hiatal hernia    Hypertension    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[place])    Past Surgical History:  Procedure Laterality Date   CARPAL TUNNEL RELEASE  2012   CRANIOTOMY N/A 02/10/2022   Procedure: SUBOCCIPITAL CRANIECTOMY FOR EVACUATION OF CEREBELLAR HEMATOMA;  Surgeon: Kristeen Miss, MD;  Location: [facility];  Service: Neurosurgery;  Laterality: N/A;   JOINT REPLACEMENT Right 2010   SHOULDER ARTHROSCOPY WITH ROTATOR CUFF REPAIR AND OPEN BICEPS TENODESIS Right 11/09/2019   Procedure: RIGHT SHOULDER ARTHROSCOPY WITH SUBSCAPULARIS REPAIR, SUBACROMIAL DECOMPRESSION,MINI OPEN ROTATOR CUFF REPAIR;  Surgeon: Leim Fabry, MD;  Location: [facility];  Service: Orthopedics;  Laterality: Right;   Patient Active Problem List   Diagnosis Date Noted   Proteinuria, unspecified 05/23/2022   Simple renal cyst 05/23/2022   Long term current use of anticoagulant 04/04/2022   Rotator cuff syndrome 04/04/2022   Exposure to potentially hazardous substance 04/04/2022   Gout 04/04/2022   Osteoarthritis 04/04/2022   Osteoporosis 04/04/2022   Other specified diseases of hair and hair follicles 11/91/4782   Pain in  joint, lower leg 04/04/2022   Problem related to unspecified psychosocial circumstances 04/04/2022   General medical examination for administrative purposes 04/04/2022   Stage 3b chronic kidney disease ([place])    Dysphagia, post-stroke    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   Cough 95/62/1308   Acute metabolic encephalopathy 65/78/4696   Obstructive hydrocephalus ([place]) 02/19/2022   Dyslipidemia 02/19/2022   Dysphagia 02/19/2022   AKI (acute kidney injury) ([place])    Hyperkalemia    Anemia    Pleural effusion on right    Respiratory failure requiring intubation (HCC)    Cerebral edema (HCC)    Chronic atrial fibrillation ([place])    Controlled type 2 diabetes mellitus with hyperglycemia, without long-term current use of insulin (HCC)    ICH (intracerebral hemorrhage) ([place]) 02/10/2022   Intracranial hemorrhage (HCC)    Anticoagulated    Moderate tricuspid regurgitation 07/26/2021   Moderate aortic valve stenosis 03/08/2020   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[place]) 02/18/2020   Macrocytic anemia 01/13/2020   Spleen enlargement 01/13/2020   Bilateral carotid artery stenosis 07/14/2018   Atrial fibrillation, chronic ([place]) 07/14/2018   Type 2 diabetes with nephropathy ([place]) 02/14/2016   Essential hypertension 08/16/2014   Hyperlipemia, mixed 08/16/2014   Renal insufficiency 08/16/2014    PERTINENT HISTORY:  March 2023 experienced hemorrhage in cerebellar peduncle and underwent craniotomy for evacuation. Notes his LUE and LLE are most  affected at present. Underwent acute hospitalization, and [facility]. Recently D/C from home health on 04/30/22. Did not receive HHST, only HHPT/OT.     ONSET DATE: 02/10/22   REFERRING DIAG: I62.9 (ICD-10-CM) - Nontraumatic intracranial hemorrhage, unspecified   THERAPY DIAG:  Dysarthria and anarthria  Cognitive communication deficit  Rationale for Evaluation and Treatment Rehabilitation  SUBJECTIVE: "([name]) does (the PHoRTE  exercises) with me."   PAIN:  Are you having pain? No   OBJECTIVE:   TODAY'S TREATMENT: 06/21/22: "I have to get a CT and then they will tell me what the plan is (for cardiac sx)." Pt with WNL voicing for intermittent segments of 20-30 seconds during ST today, x9. SLP had pt perform the HEP (PHoRTE) and pt req'd rare min A; usually SLP needing to provide more intense cueing than that level - SLP attributes this to pt performing with wife correctly, and consistently. Pt req'd mod-max A tfor appointment tracking - told SLP that  Monday's appointments were today's and they were wrong on the sheet. When SLP asked pt for the date today, pt looked at SLP calendar and then pt looked elsewhere for today's date and then realized he was telling me appointments for next week and not for today.  Pt repeated story to SLP he told earlier in session x1.    06/14/22: SLP engaged pt with his PHoRTE HEP today in presence of wife, given the fact [name] was not performing HEP correctly spontaneously previous session. SLP reiterated for pt/wife today how important correct completion of PHoRTE is, and that if pt's voice is not notably improved in 4 weeks ENT consult may be warranted. With occasional min A faded to rare min A for breath support, and occasional min A for "sigh" warm ups and for pitch lo-hi-lo numbers, pt completed HEP. fPrior to this, pt req'd shaping of abdominal breathing (AB) due to clavicular/thoracic breathing. Wife asking questions about PHoRTE throughout session and SLP answering these. SLP told [name] that [name] needed to "play the role of [name]" if pt is not completing exercises correctly at home. Pt acknowledged this.  Pt stated SLP did not put his PHoRTE HEP back in his folder after previous session but HEP was in pocket inside front cover of notebook.  06/11/22: Vernon [name] states he has been completing PHoRTE BID, however was not completing the numbers (lo-hi-lo pitch) correctly - he was saying them in a  loud voice instead of varied pitch. SLP corrected pt and he was mod I by session end. [name] req'd occasional faded to rare min-mod SLP cues for good breath prior to each rep for numbers and sentences, and to slow productions down for quality instead of rushing through reps. Loud "ah" definitely sounds stronger than previous session with this SLP on 05-31-22. SLP also believes sigh or yawn-sigh vocalization sounds more relaxed than "waterfall" vowel. Higher pitched (calling "over the fence") rated 4/10 effort (10=most effort), and lower pitched "scolding dog" voice rated 6-7/10 effort level.  SLP worked with pt re: calendar orientation and appointment tracking. Pt req'd extra time for telling SLP date and appointment times. He req'd min cues for recall of Dr. Leonie Man appointment last week but when provided this by SLP Vernon [name] gave details that were also shown in MD note.   06/06/22: PT BROUGHT FOLDER TODAY and wanted [name] to know he remembered. Pt reports he has been completing his exercises x2/day and reports others believe he's made improvement. Completed PHoRTE exercises. Required cueing to rduce tight/strained voice,  as well as to slow down for adequate breath support. Pt was able to produce "Over the fence" voice sentences with more ease, rating it a 4/10 in effort level required. Pt completed authoritative voice sentences with more difficulty, rating it a 6/10 in effort level required.   Pt felt doing a sigh vs. Waterfall was easier to complete with less tension. SLP in agreement.   05/31/22: Pt entered with his own folder today; "SEE I TOLD YOU I'D REMEMBER IT!" He had other things that he did each day written on his calendar as well. SLP introduced PHOrTE exercises to pt today (see pt instructions) and worked with him to achieve adequate breath prior to each rep (pt would rush at times and not achieve adequate breath support). SLP provided Vernon [name] with detailed instructions and also educated wife, alone in lobby  at the time, on each exercise. No throat clearing the last two sessions.  05/28/22: Pt provided very detailed explanation regarding of his nephrology appointment last week, and about his plans for tomorrow with family. He stated next session he will bring his folder, instead of relying on [name]. SLP worked with pt on his voice exercises, requiring min A occasionally for strong voice without squeezing, and min A usually for abdominal breathing. Pt's loud /a/ was produced with WNL voicing 50%.       PATIENT EDUCATION: Education details: See "treatment" above Person educated: Patient, wife Education method: Explanation, Demonstration, Verbal cues Education comprehension: verbalized understanding, returned demonstration, verbal cues required, and needs further education      GOALS: Goals reviewed with patient? Yes   SHORT TERM GOALS: Target date: 05/31/22 Pt will complete HEP for voice/dysarthria with occasional min A over 2 sessions Baseline: 05/24/22 Goal status: Met   2.  Pt will use external aids to recall daily events/schedule with occasional min A from spouse over 1 week Baseline:  Goal status: Deferred   3.  Pt will achieve clear phonation 18/20 sentences in structured task Baseline:  Goal status: Not met   4.  Pt will ID dysnomic errors and self correct with rare min A over 2 sessions Baseline:  Goal status: Deferred     LONG TERM GOALS: Target date: 07/26/22 Pt will achieve clear phonation over 10 minute conversation with rare min A over 2 sessions Baseline:  Goal status: Revised   2.  Pt will independently recall weekly/daily schedule, appointments events and pertinent information with memory aids over 2 sessions Baseline:  Goal status: Deferred   3.  Pt will complete vocal warm ups and follow 3 vocal hygiene strategies to return to singing for 5-10 minute periods with rare min A Baseline:  Goal status: Ongoing   4.  Improve score on Cognitive function PROM by 4  points Baseline: 101- filled out by spouse Goal status: Ongoing   ASSESSMENT:   CLINICAL IMPRESSION: Patient is a 76 y.o. male who is seen for dysarthria/voice and cognitive linguistics s/p CVA. See tx note. Today pt only req'd mod I  (cue to look at sheet) with PHoRTE HEP, and SLP heard fleeting intermittent WNL voicing during session. Johnwilliam is a Therapist, nutritional and sings for fun and at church. If pt's voice does not show marked improvement in 4 weeks, ENT consult may be requested/suggested. I recommend cont'd skilled ST to maximize voice and cognition for safety, to reduce caregiver burden and QOL.    OBJECTIVE IMPAIRMENTS include attention, memory, awareness, dysarthria, and voice disorder. These impairments are limiting patient from managing appointments,  household responsibilities, and effectively communicating at home and in community. Factors affecting potential to achieve goals and functional outcome are ability to learn/carryover information.. Patient will benefit from skilled SLP services to address above impairments and improve overall function.   REHAB POTENTIAL: Good   PLAN: SLP FREQUENCY: 2x/week   SLP DURATION: 12 weeks   PLANNED INTERVENTIONS: Language facilitation, Environmental controls, Cueing hierachy, Cognitive reorganization, Internal/external aids, Functional tasks, Multimodal communication approach, and SLP instruction and feedback         [place], [place] 06/21/2022, 2:56 PM

## 2022-06-25 ENCOUNTER — Ambulatory Visit: Payer: Medicare Other | Admitting: Occupational Therapy

## 2022-06-25 ENCOUNTER — Ambulatory Visit: Payer: Medicare Other

## 2022-06-25 DIAGNOSIS — R2681 Unsteadiness on feet: Secondary | ICD-10-CM

## 2022-06-25 DIAGNOSIS — M6281 Muscle weakness (generalized): Secondary | ICD-10-CM

## 2022-06-25 DIAGNOSIS — R2689 Other abnormalities of gait and mobility: Secondary | ICD-10-CM

## 2022-06-25 DIAGNOSIS — I69254 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: Secondary | ICD-10-CM | POA: Diagnosis not present

## 2022-06-25 DIAGNOSIS — R471 Dysarthria and anarthria: Secondary | ICD-10-CM

## 2022-06-25 DIAGNOSIS — R41841 Cognitive communication deficit: Secondary | ICD-10-CM

## 2022-06-25 DIAGNOSIS — R278 Other lack of coordination: Secondary | ICD-10-CM

## 2022-06-25 DIAGNOSIS — R262 Difficulty in walking, not elsewhere classified: Secondary | ICD-10-CM

## 2022-06-25 NOTE — Therapy (Signed)
OUTPATIENT PHYSICAL THERAPY TREATMENT NOTE   Patient Name: BOWEN KIA MRN: [id number] DOB:[birth year], 76 y.o., male Today's Date: 06/25/2022  PCP: Gracelyn Nurse, MD  REFERRING PROVIDER: Gracelyn Nurse, MD   END OF SESSION:   PT End of Session - 06/25/22 1450     Visit Number 13    Number of Visits 16    Date for PT Re-Evaluation 07/03/22    Authorization Type [employer] Medicare    Progress Note Due on Visit 19    PT Start Time 1445    PT Stop Time 1530    PT Time Calculation (min) 45 min    Equipment Utilized During Treatment Gait belt    Activity Tolerance Patient tolerated treatment well    Behavior During Therapy [facility] for tasks assessed/performed;Impulsive                Past Medical History:  Diagnosis Date   Anemia    Aortic stenosis    Arthritis    Complication of anesthesia    hard time getting bp up after knee replacement   Diabetes mellitus without complication (HCC)    GERD (gastroesophageal reflux disease)    occ tums prn   History of hiatal hernia    Hypertension    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HCC)    Past Surgical History:  Procedure Laterality Date   CARPAL TUNNEL RELEASE  2012   CRANIOTOMY N/A 02/10/2022   Procedure: SUBOCCIPITAL CRANIECTOMY FOR EVACUATION OF CEREBELLAR HEMATOMA;  Surgeon: Barnett Abu, MD;  Location: [facility] OR;  Service: Neurosurgery;  Laterality: N/A;   JOINT REPLACEMENT Right 2010   SHOULDER ARTHROSCOPY WITH ROTATOR CUFF REPAIR AND OPEN BICEPS TENODESIS Right 11/09/2019   Procedure: RIGHT SHOULDER ARTHROSCOPY WITH SUBSCAPULARIS REPAIR, SUBACROMIAL DECOMPRESSION,MINI OPEN ROTATOR CUFF REPAIR;  Surgeon: Signa Kell, MD;  Location: [facility] ORS;  Service: Orthopedics;  Laterality: Right;   Patient Active Problem List   Diagnosis Date Noted   Proteinuria, unspecified 05/23/2022   Simple renal cyst 05/23/2022   Long term current use of anticoagulant 04/04/2022   Rotator cuff syndrome 04/04/2022   Exposure to potentially  hazardous substance 04/04/2022   Gout 04/04/2022   Osteoarthritis 04/04/2022   Osteoporosis 04/04/2022   Other specified diseases of hair and hair follicles 04/04/2022   Pain in joint, lower leg 04/04/2022   Problem related to unspecified psychosocial circumstances 04/04/2022   General medical examination for administrative purposes 04/04/2022   Stage 3b chronic kidney disease (HCC)    Dysphagia, post-stroke    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   Cough 02/20/2022   Acute metabolic encephalopathy 02/20/2022   Obstructive hydrocephalus (HCC) 02/19/2022   Dyslipidemia 02/19/2022   Dysphagia 02/19/2022   AKI (acute kidney injury) (HCC)    Hyperkalemia    Anemia    Pleural effusion on right    Respiratory failure requiring intubation (HCC)    Cerebral edema (HCC)    Chronic atrial fibrillation (HCC)    Controlled type 2 diabetes mellitus with hyperglycemia, without long-term current use of insulin (HCC)    ICH (intracerebral hemorrhage) (HCC) 02/10/2022   Intracranial hemorrhage (HCC)    Anticoagulated    Moderate tricuspid regurgitation 07/26/2021   Moderate aortic valve stenosis 03/08/2020   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HCC) 02/18/2020   Macrocytic anemia 01/13/2020   Spleen enlargement 01/13/2020   Bilateral carotid artery stenosis 07/14/2018   Atrial fibrillation, chronic (HCC) 07/14/2018   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   Essential hypertension 08/16/2014  Hyperlipemia, mixed 08/16/2014   Renal insufficiency 08/16/2014    REFERRING DIAG: I62.9 (ICD-10-CM) - Nontraumatic intracranial hemorrhage, unspecified   THERAPY DIAG:  Muscle weakness (generalized)  Unsteadiness on feet  Other abnormalities of gait and mobility  Difficulty in walking, not elsewhere classified  Rationale for Evaluation and Treatment Rehabilitation  PERTINENT HISTORY: see MAR  PRECAUTIONS: fall risk  SUBJECTIVE: LEFT Knee has been sore and  buckling/giving way  PAIN:  Are you having pain? No pain in sitting; 3/10 pain with walking in L knee  OBJECTIVE:    TODAY'S TREATMENT: 06/25/22 Activity Comments  Car transfers, gait training w/ RW and curb negotiation CGA w/ RW and instances of left knee instability/unsteadiness  Standing with foot elevated on 6" step EO/EC 3x15 sec Head turns 5x EO/EC  Standing on foam -trunk twists 5# 3x10 -overhead press 5# 3x10 -eyes closed 3x15 sec  Forward/backward over bolster Lateral stepping over bolster 2x10  Standing performing ball dribble Difficulty with LUE coordination, cues for breathing/bracing core which improved proximal stability and improved coordination for catching bouncing ball          PATIENT EDUCATION: Education details: Larger wheels for his current RW (has small wheels on it, and larger wheels may help increase steadiness) Person educated: Patient Education method: Explanation Education comprehension: verbalized understanding   -------------------------------------------------------------------------------------------------------------------------   From eval  DIAGNOSTIC FINDINGS: see MAR   COGNITION: Overall cognitive status: Within functional limits for tasks assessed             SENSATION: WFL   COORDINATION: Bradykinesia, difficulty wit rapid alternating movements     MUSCLE TONE: WFL       POSTURE: No Significant postural limitations   LOWER EXTREMITY ROM:      WFL  (Blank rows = not tested)   LOWER EXTREMITY MMT:     MMT Right Eval Left Eval  Hip flexion 5 4  Hip extension 5 3+  Hip abduction 5 3-  Hip adduction      Hip internal rotation      Hip external rotation      Knee flexion 5 4  Knee extension 5 4  Ankle dorsiflexion 5 4+  Ankle plantarflexion      Ankle inversion      Ankle eversion      (Blank rows = not tested)   BED MOBILITY:  Independent   TRANSFERS: Assistive device utilized: Environmental consultant - 2 wheeled  Sit to  stand: Modified independence Stand to sit: Modified independence Chair to chair: SBA and CGA Floor:  DNT   RAMP:  Not available   CURB:  Level of Assistance: SBA and CGA Assistive device utilized: Environmental consultant - 2 wheeled Curb Comments: undershoots with LLE   STAIRS:           Level of Assistance: SBA and CGA           Stair Negotiation Technique: Alternating Pattern  with Bilateral Rails           Number of Stairs: 6             Height of Stairs: 4-6"            Comments: dysmetria w/ LLE   GAIT: Gait pattern: circumduction- Left and ataxic Distance walked: 100 Assistive device utilized: Walker - 2 wheeled Level of assistance: SBA Comments: dysmetria LLE   FUNCTIONAL TESTs:  Timed up and go (TUG): 20.25 with RW Berg Balance Scale: 35/56      M-CTSIB  Condition  1: Firm Surface, EO 30 Sec, Mild Sway  Condition 2: Firm Surface, EC 30 Sec, Mild and Moderate Sway  Condition 3: Foam Surface, EO 9 Sec, Severe Sway  Condition 4: Foam Surface, EC 0 Sec,  unable  Sway    PATIENT SURVEYS:  FOTO 54.43% functional status   TODAY'S TREATMENT:  Evaluation, sidestepping along counter, tandem stance at counter     PATIENT EDUCATION: Education details: scope of PT intervention Person educated: Patient and Spouse Education method: Explanation Education comprehension: verbalized understanding     HOME EXERCISE PROGRAM: sidestepping along counter, tandem stance at counter   -----------------------------------------------------------------------------------------------------------------------    GOALS: Goals reviewed with patient? Yes   SHORT TERM GOALS: Target date: 05/29/2022   Patient will perform HEP with family/caregiver supervision for improved strength, balance, transfers, and gait  Baseline: Goal status: MET   2.  Patient will achieve 15 seconds for TUG test to manifest reduced risk for falls Baseline: 20.25 with RW; 14 sec with RW Goal status: MET   3. Demo modified  independent gait level surfaces and curb negotiation            Baseline: CGA-SBA with RW            Goal status: On-going   LONG TERM GOALS: Target date: 06/26/2022   Patient will demonstrate score 45/56 Berg Balance Test to manifest low risk for falls Baseline: 35/56; 37/56 on 06/06/22 Goal status: On-going   2.  Demonstrate improved static balance and postural control per time of 15 sec condition 4 M-CTSIB to improve safety with mobility on uneven surfaces Baseline: complete LOB left with condition 3 Goal status: On-going   3.  Demo modified independent gait using least restrictive AD over various surfaces and ascend/descend stairs with set-up assist to improve functional mobility Baseline:  Goal status: on-going   4.  Demo score of 57% functional status FOTO  Baseline: 54.43% Goal status: INITIAL       ASSESSMENT:   CLINICAL IMPRESSION: Notes his left knee has been more sore and instances of left knee buckling have been increased over the weekend.  Activities' emphasis on improved postural stability and careful approach to LLE loading during session to assess for response with bouncing into flexion/extension noted with biased LLE position.  Demonstrating improved postural stability noted as pt can now perform left head/trunk rotations w/out LOB   OBJECTIVE IMPAIRMENTS Abnormal gait, decreased activity tolerance, decreased balance, decreased coordination, decreased endurance, decreased knowledge of use of DME, difficulty walking, decreased strength, impaired perceived functional ability, and improper body mechanics.    ACTIVITY LIMITATIONS carrying, lifting, bending, standing, squatting, stairs, transfers, and locomotion level   PARTICIPATION LIMITATIONS: meal prep, cleaning, laundry, driving, shopping, community activity, and yard work   PERSONAL FACTORS Age, Fitness, and Time since onset of injury/illness/exacerbation are also affecting patient's functional outcome.    REHAB  POTENTIAL: Excellent   CLINICAL DECISION MAKING: Evolving/moderate complexity   EVALUATION COMPLEXITY: Moderate   PLAN: PT FREQUENCY: 1-2x/week   PT DURATION: 9 weeks   PLANNED INTERVENTIONS: Therapeutic exercises, Therapeutic activity, Neuromuscular re-education, Balance training, Gait training, Patient/Family education, Joint mobilization, Stair training, Vestibular training, Canalith repositioning, DME instructions, Aquatic Therapy, Electrical stimulation, Wheelchair mobility training, and Manual therapy   PLAN FOR NEXT SESSION:  Functional lower extremity strengthening, controlled movement patterns with initiation of gait and turns; training with gait in/out of clinic to negotiate curbs and sidewalk.  2:51 PM, 06/25/22 M. Sherlyn Lees, PT, DPT Physical Therapist- [facility] Office Number: [phone]    [place] [facility] [address] [place], [place] [address] Phone # [phone] Fax # [phone]

## 2022-06-25 NOTE — Therapy (Signed)
OUTPATIENT OCCUPATIONAL THERAPY Treatment Note   Patient Name: Vernon [name] MRN: [id number] DOB:07-11-1946, 76 y.o., male Today's Date: 06/25/2022  PCP: Baxter Hire, MD REFERRING PROVIDER: Baxter Hire, MD      OT End of Session - 06/25/22 1537     Visit Number 12    Number of Visits 17    Date for OT Re-Evaluation 07/06/22    Authorization Type UHC Medicare    Authorization Time Period VL: [place]    OT Start Time 1534    OT Stop Time 1616    OT Time Calculation (min) 42 min    Activity Tolerance Patient tolerated treatment well    Behavior During Therapy WFL for tasks assessed/performed                Past Medical History:  Diagnosis Date   Anemia    Aortic stenosis    Arthritis    Complication of anesthesia    hard time getting bp up after knee replacement   Diabetes mellitus without complication (HCC)    GERD (gastroesophageal reflux disease)    occ tums prn   History of hiatal hernia    Hypertension    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[place])    Past Surgical History:  Procedure Laterality Date   CARPAL TUNNEL RELEASE  2012   CRANIOTOMY N/A 02/10/2022   Procedure: SUBOCCIPITAL CRANIECTOMY FOR EVACUATION OF CEREBELLAR HEMATOMA;  Surgeon: Kristeen Miss, MD;  Location: [facility];  Service: Neurosurgery;  Laterality: N/A;   JOINT REPLACEMENT Right 2010   SHOULDER ARTHROSCOPY WITH ROTATOR CUFF REPAIR AND OPEN BICEPS TENODESIS Right 11/09/2019   Procedure: RIGHT SHOULDER ARTHROSCOPY WITH SUBSCAPULARIS REPAIR, SUBACROMIAL DECOMPRESSION,MINI OPEN ROTATOR CUFF REPAIR;  Surgeon: Leim Fabry, MD;  Location: [facility] OR[facility];  Service: Orthopedics;  Laterality: Right;   Patient Active Problem List   Diagnosis Date Noted   Proteinuria, unspecified 05/23/2022   Simple renal cyst 05/23/2022   Long term current use of anticoagulant 04/04/2022   Rotator cuff syndrome 04/04/2022   Exposure to potentially hazardous substance 04/04/2022   Gout 04/04/2022   Osteoarthritis  04/04/2022   Osteoporosis 04/04/2022   Other specified diseases of hair and hair follicles 57/84/6962   Pain in joint, lower leg 04/04/2022   Problem related to unspecified psychosocial circumstances 04/04/2022   General medical examination for administrative purposes 04/04/2022   Stage 3b chronic kidney disease ([place])    Dysphagia, post-stroke    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 hemorrhage of brain ([place]) 02/26/2022   Cough 95/28/4132   Acute metabolic encephalopathy 44/11/270   Obstructive hydrocephalus ([place]) 02/19/2022   Dyslipidemia 02/19/2022   Dysphagia 02/19/2022   AKI (acute kidney injury) ([place])    Hyperkalemia    Anemia    Pleural effusion on right    Respiratory failure requiring intubation (HCC)    Cerebral edema (HCC)    Chronic atrial fibrillation ([place])    Controlled type 2 diabetes mellitus with hyperglycemia, without long-term current use of insulin (HCC)    ICH (intracerebral hemorrhage) ([place]) 02/10/2022   Intracranial hemorrhage (HCC)    Anticoagulated    Moderate tricuspid regurgitation 07/26/2021   Moderate aortic valve stenosis 03/08/2020   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[place]) 02/18/2020   Macrocytic anemia 01/13/2020   Spleen enlargement 01/13/2020   Bilateral carotid artery stenosis 07/14/2018   Atrial fibrillation, chronic ([place]) 07/14/2018   Type 2 diabetes with nephropathy ([place]) 02/14/2016   Essential hypertension 08/16/2014   Hyperlipemia, mixed 08/16/2014   Renal insufficiency 08/16/2014    ONSET DATE:  02/10/2022  REFERRING DIAG: I62.9 (ICD-10-CM) - Nontraumatic intracranial hemorrhage, unspecified  THERAPY DIAG:  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  Pt  accompanied by: self  PERTINENT HISTORY: cerebellar hemorrhage w/ shift and early hydrocephalus; emergent decompression surgery w/ Dr. Ellene Route on 02/11/22 w/ EVD pulled 02/14/22. PMH includes DMT2, HTN, HLD, atrial fibrillation on Eliquis, arthritis, anemia, and GERD  PAIN: Are you having pain? Yes, L knee 4/10.  Throbbing.  PATIENT GOALS: Be independent again; play guitar  OBJECTIVE:   TODAY'S TREATMENT:  WB through elbow and into shoulder while moving rings from one cone to the other.  Therapist providing min-mod facilitation for increased WB while pt reaching with RUE.  Post WB, engaged in moving rings with LUE.  Pt demonstrating mild ataxia, but overall improved functional movements with vertical and horizontal movements of LUE while moving rings. WB through full extended LUE while reaching with RUE.  Engaged in reaching with LUE post WB with initial improvements in ataxia.  Pt demonstrating mild ataxia when picking up medium sized jacks with L hand, but decreased ataxia when reaching to place them in matching cups.  During 2nd repetition, pt with increased ataxia both when picking up pieces and then when placing in cups due to onset of fatigue. Completed 9 hole peg test after WB activities.  Completed 2 attempts: L: 1:06.53 and 2nd attempt: 46.12 sec Coordination: rotating 1" dice in finger tips, rotating pen in finger tips.  At beginning of session pt able to complete thumb opposition to all digits except index, but by end of session pt demonstrating significant improvement with thumb to index opposition.  Resulting in improvements with 9 hole peg test as well.   PATIENT EDUCATION: Ongoing condition-specific education, particularly regarding neuro reed and typical recovery patterns as well as benefit and goal of weight bearing/increased somatosensory input through affected UE Person educated: Patient Education method: Explanation Education comprehension: verbalized understanding   HOME  EXERCISE PROGRAM: Coordination handout - see pt instructions  Access Code: [email or web address] URL: [email or web address] Date: 06/04/2022 Prepared by: [facility]  Exercises - Seated Shoulder Flexion with [place]el to 90  - 4 x weekly - 2-3 sets - 10 reps - Seated Chest Press with Bar  - 4 x weekly - 2-3 sets - 10 reps - Single Arm Shoulder Flexion with Dumbbell  - 4 x weekly - 2-3 sets - 10 reps - Seated Shoulder External Rotation with Dumbbell  - 4 x weekly - 2-3 sets - 10 reps   GOALS: Goals reviewed with patient? Yes  SHORT TERM GOALS: Target date: 06/08/22  STG  Status:  1 Pt will demonstrate independence w/ initial HEP designed for LUE GM and [place] Baseline: No HEP at this time Progressing  2 Pt will improve participation in functional bilateral FM tasks as evidenced by decreasing time to complete 9-Hole Peg Test w/ LUE by at least 6 sec Baseline: Right: 21.8  sec; Left: 51.2 sec Progressing - 06/04/22 51.66 sec 06/14/22 - 1:12.03 sec 06/26/11 - 56.32 sec  3 Pt will be able to complete simulated bilateral functional task (e.g. cutting food, manipulating clothing fasteners, washing dishes) within reasonable amount of time Baseline: Difficulty w/ bilateral tasks Progressing     LONG  TERM GOALS: Target date: 07/06/22  LTG  Status:  1 Pt will improve participation in functional activities as evidenced by increasing FOTO score to 71 by d/c Baseline: 62 Progressing  2 Pt will demonstrate improved control and accuracy w/ LUE by completing 8 taps within 10 seconds during finger to nose test Baseline: Right: 12 taps in 10 sec; Left: 6 taps in 10 sec Progressing - 8 taps in 10 seconds (while stabilizing elbow at side)  3 Pt will improve Box and Blocks score to at least 40 blocks by d/c to indicate improved unilateral GMC of LUE Baseline: Right 61 blocks, Left 29 blocks Progressing - 06/14/22 - 34 blocks  4 Pt will safely demonstrate simulated grilling  activity, incorporating compensatory strategies/AE prn, w/ Mod I by d/c Baseline: Unable to complete at this time Progressing     ASSESSMENT:  CLINICAL IMPRESSION: Treatment session with focus on closed chain movements with WB through elbow and then extended forearm for NMR.  Pt demonstrates decreased impact of ataxia when able to increase proprioceptive input through UE by either closed chain on surface or stabilizing elbow along torso.  Pt continues to benefit from intermittent cues for pacing with functional reach and FMC/GMC tasks with LUE.  Pt continues to demonstrate ataxia with both gross and fine motor tasks, benefiting from verbal cues for pacing, as decreased speed facilitates increased accuracy and success. Pt demonstrating improved thumb opposition this session and improved [place] with 9 hole peg test.  PERFORMANCE DEFICITS in functional skills including ADLs, IADLs, coordination, dexterity, sensation, strength, FMC, GMC, mobility, balance, body mechanics, and UE functional use.  IMPAIRMENTS are limiting patient from ADLs, IADLs, and leisure.   COMORBIDITIES has co-morbidities such as atrial fibrillation on Eliquis and DMT2  that affects occupational performance. Patient will benefit from skilled OT to address above impairments and improve overall function.  MODIFICATION OR ASSISTANCE TO COMPLETE EVALUATION: Min-Moderate modification of tasks or assist with assess necessary to complete an evaluation.  OT OCCUPATIONAL PROFILE AND HISTORY: Detailed assessment: Review of records and additional review of physical, cognitive, psychosocial history related to current functional performance.  CLINICAL DECISION MAKING: Moderate - several treatment options, min-mod task modification necessary  REHAB POTENTIAL: Good  EVALUATION COMPLEXITY: Moderate   PLAN: OT FREQUENCY: 2x/week  OT DURATION: 8 weeks  PLANNED INTERVENTIONS: self care/ADL training, therapeutic exercise, therapeutic  activity, neuromuscular re-education, manual therapy, functional mobility training, aquatic therapy, electrical stimulation, ultrasound, biofeedback, moist heat, cryotherapy, patient/family education, energy conservation, and DME and/or AE instructions  RECOMMENDED OTHER SERVICES: To receive PT/ST services at this location  CONSULTED AND AGREED WITH PLAN OF CARE: Patient and family member/caregiver  PLAN FOR NEXT SESSION:   GMC/FMC activities for LUE. Seated vs standing weight-bearing, closed-chain exercise, coordination activities, etc.  Functional in nature, if possible, to carry over to pt desire to play guitar again.  Engage in checkbook activity with focus on alternating attention and error awareness.   Simonne Come, OTR/L 06/25/2022, 3:38 PM

## 2022-06-25 NOTE — Therapy (Signed)
OUTPATIENT SPEECH LANGUAGE PATHOLOGY TREATMENT NOTE   Patient Name: Vernon [name] MRN: [id number] DOB:1946/03/03, 76 y.o., male Today's Date: 06/25/2022  PCP: Baxter Hire, MD  REFERRING PROVIDER: Baxter Hire, MD   END OF SESSION:   End of Session - 06/25/22 1621     Visit Number 11    Number of Visits 25    Date for SLP Re-Evaluation 07/26/22    Authorization Type U[employer]    SLP Start Time 1620    SLP Stop Time  1700    SLP Time Calculation (min) 40 min    Activity Tolerance Patient tolerated treatment well                 Past Medical History:  Diagnosis Date   Anemia    Aortic stenosis    Arthritis    Complication of anesthesia    hard time getting bp up after knee replacement   Diabetes mellitus without complication (HCC)    GERD (gastroesophageal reflux disease)    occ tums prn   History of hiatal hernia    Hypertension    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[place])    Past Surgical History:  Procedure Laterality Date   CARPAL TUNNEL RELEASE  2012   CRANIOTOMY N/A 02/10/2022   Procedure: SUBOCCIPITAL CRANIECTOMY FOR EVACUATION OF CEREBELLAR HEMATOMA;  Surgeon: Kristeen Miss, MD;  Location: [facility];  Service: Neurosurgery;  Laterality: N/A;   JOINT REPLACEMENT Right 2010   SHOULDER ARTHROSCOPY WITH ROTATOR CUFF REPAIR AND OPEN BICEPS TENODESIS Right 11/09/2019   Procedure: RIGHT SHOULDER ARTHROSCOPY WITH SUBSCAPULARIS REPAIR, SUBACROMIAL DECOMPRESSION,MINI OPEN ROTATOR CUFF REPAIR;  Surgeon: Leim Fabry, MD;  Location: [facility];  Service: Orthopedics;  Laterality: Right;   Patient Active Problem List   Diagnosis Date Noted   Proteinuria, unspecified 05/23/2022   Simple renal cyst 05/23/2022   Long term current use of anticoagulant 04/04/2022   Rotator cuff syndrome 04/04/2022   Exposure to potentially hazardous substance 04/04/2022   Gout 04/04/2022   Osteoarthritis 04/04/2022   Osteoporosis 04/04/2022   Other specified diseases of hair and hair  follicles 40/98/1191   Pain in joint, lower leg 04/04/2022   Problem related to unspecified psychosocial circumstances 04/04/2022   General medical examination for administrative purposes 04/04/2022   Stage 3b chronic kidney disease ([place])    Dysphagia, post-stroke    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 hemorrhage of brain ([place]) 02/26/2022   Cough 47/82/9562   Acute metabolic encephalopathy 13/06/6577   Obstructive hydrocephalus ([place]) 02/19/2022   Dyslipidemia 02/19/2022   Dysphagia 02/19/2022   AKI (acute kidney injury) ([place])    Hyperkalemia    Anemia    Pleural effusion on right    Respiratory failure requiring intubation (HCC)    Cerebral edema (HCC)    Chronic atrial fibrillation ([place])    Controlled type 2 diabetes mellitus with hyperglycemia, without long-term current use of insulin (HCC)    ICH (intracerebral hemorrhage) ([place]) 02/10/2022   Intracranial hemorrhage (HCC)    Anticoagulated    Moderate tricuspid regurgitation 07/26/2021   Moderate aortic valve stenosis 03/08/2020   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[place]) 02/18/2020   Macrocytic anemia 01/13/2020   Spleen enlargement 01/13/2020   Bilateral carotid artery stenosis 07/14/2018   Atrial fibrillation, chronic ([place]) 07/14/2018   Type 2 diabetes with nephropathy ([place]) 02/14/2016   Essential hypertension 08/16/2014   Hyperlipemia, mixed 08/16/2014   Renal insufficiency 08/16/2014    PERTINENT HISTORY:  March 2023 experienced hemorrhage in cerebellar peduncle and underwent craniotomy for evacuation. Notes  his LUE and LLE are most affected at present. Underwent acute hospitalization, and [facility]. Recently D/C from home health on 04/30/22. Did not receive HHST, only HHPT/OT.     ONSET DATE: 02/10/22   REFERRING DIAG: I62.9 (ICD-10-CM) - Nontraumatic intracranial hemorrhage, unspecified   THERAPY DIAG:  Dysarthria and anarthria  Cognitive communication deficit  Rationale for Evaluation and Treatment  Rehabilitation  SUBJECTIVE: "([name]) should be here at 4:30 (pm).."   PAIN:  Are you having pain? No   OBJECTIVE:   TODAY'S TREATMENT: 06/25/22: SLP reminded pt that he had difficulty last session with ID that day's therapy sessions, when he returned, etc. SLP asked pt to tell the sessions today which he did correctly. When SLP asked pt to tell the sequence of next appointments, he was incorrect and req'd SLP A for correction. This occurred due to pt taking out the *entire page* of a horizontal bifold calendar to look at what appointments were for next month- thus pt was looking at [facility] months and req'd correction for error awareness.  Pt tells SLP he maintains BID HEP completion. Pt states he feels stronger voice now with upper and lower pitches and the middle of the range is where he feels the weakest. [name] came in to [place] and she and pt did HEP together. Pt req'd min A rarely for making sure low-hi-low numbers started at lowest pitch, slowing sentence reps, and that he was using abdominal breathing (AB) for all productions. [name] stated pt had difficulty balancing checkbook ("for every 4 right thing sthere would be one wrong one"). SLP explained Mikaeel is having more difficulty with error awareness and attention to details at this time and that was another example (first being calendar task today).   06/21/22: "I have to get a CT and then they will tell me what the plan is (for cardiac sx)." Pt with WNL voicing for intermittent segments of 20-30 seconds during ST today, x9. SLP had pt perform the HEP (PHoRTE) and pt req'd rare min A; usually SLP needing to provide more intense cueing than that level - SLP attributes this to pt performing with wife correctly, and consistently. Pt req'd mod-max A tfor appointment tracking - told SLP that  Monday's appointments were today's and they were wrong on the sheet. When SLP asked pt for the date today, pt looked at SLP calendar and then pt looked  elsewhere for today's date and then realized he was telling me appointments for next week and not for today.  Pt repeated story to SLP he told earlier in session x1.    06/14/22: SLP engaged pt with his PHoRTE HEP today in presence of wife, given the fact Tevis was not performing HEP correctly spontaneously previous session. SLP reiterated for pt/wife today how important correct completion of PHoRTE is, and that if pt's voice is not notably improved in 4 weeks ENT consult may be warranted. With occasional min A faded to rare min A for breath support, and occasional min A for "sigh" warm ups and for pitch lo-hi-lo numbers, pt completed HEP. fPrior to this, pt req'd shaping of abdominal breathing (AB) due to clavicular/thoracic breathing. Wife asking questions about PHoRTE throughout session and SLP answering these. SLP told Muaz that [name] needed to "play the role of [name]" if pt is not completing exercises correctly at home. Pt acknowledged this.  Pt stated SLP did not put his PHoRTE HEP back in his folder after previous session but HEP was in pocket  inside front cover of notebook.  06/11/22: [name] states he has been completing PHoRTE BID, however was not completing the numbers (lo-hi-lo pitch) correctly - he was saying them in a loud voice instead of varied pitch. SLP corrected pt and he was mod I by session end. Koty req'd occasional faded to rare min-mod SLP cues for good breath prior to each rep for numbers and sentences, and to slow productions down for quality instead of rushing through reps. Loud "ah" definitely sounds stronger than previous session with this SLP on 05-31-22. SLP also believes sigh or yawn-sigh vocalization sounds more relaxed than "waterfall" vowel. Higher pitched (calling "over the fence") rated 4/10 effort (10=most effort), and lower pitched "scolding dog" voice rated 6-7/10 effort level.  SLP worked with pt re: calendar orientation and appointment tracking. Pt req'd extra time for  telling SLP date and appointment times. He req'd min cues for recall of Dr. Leonie Man appointment last week but when provided this by SLP [name] gave details that were also shown in MD note.   06/06/22: PT BROUGHT FOLDER TODAY and wanted [name] to know he remembered. Pt reports he has been completing his exercises x2/day and reports others believe he's made improvement. Completed PHoRTE exercises. Required cueing to rduce tight/strained voice, as well as to slow down for adequate breath support. Pt was able to produce "Over the fence" voice sentences with more ease, rating it a 4/10 in effort level required. Pt completed authoritative voice sentences with more difficulty, rating it a 6/10 in effort level required.   Pt felt doing a sigh vs. Waterfall was easier to complete with less tension. SLP in agreement.   05/31/22: Pt entered with his own folder today; "SEE I TOLD YOU I'D REMEMBER IT!" He had other things that he did each day written on his calendar as well. SLP introduced PHOrTE exercises to pt today (see pt instructions) and worked with him to achieve adequate breath prior to each rep (pt would rush at times and not achieve adequate breath support). SLP provided [name] with detailed instructions and also educated wife, alone in lobby at the time, on each exercise. No throat clearing the last two sessions.     PATIENT EDUCATION: Education details: See "treatment" above Person educated: Patient, wife Education method: Explanation, Demonstration, Verbal cues Education comprehension: verbalized understanding, returned demonstration, verbal cues required, and needs further education      GOALS: Goals reviewed with patient? Yes   SHORT TERM GOALS: Target date: 05/31/22 Pt will complete HEP for voice/dysarthria with occasional min A over 2 sessions Baseline: 05/24/22 Goal status: Met   2.  Pt will use external aids to recall daily events/schedule with occasional min A from spouse over 1 week Baseline:   Goal status: Deferred   3.  Pt will achieve clear phonation 18/20 sentences in structured task Baseline:  Goal status: Not met   4.  Pt will ID dysnomic errors and self correct with rare min A over 2 sessions Baseline:  Goal status: Deferred     LONG TERM GOALS: Target date: 07/26/22 Pt will achieve clear phonation over 10 minute conversation with rare min A over 2 sessions Baseline:  Goal status: Revised   2.  Pt will independently recall weekly/daily schedule, appointments events and pertinent information with memory aids over 2 sessions Baseline:  Goal status: Onoging   3.  Pt will complete vocal warm ups and follow 3 vocal hygiene strategies to return to singing for 5-10 minute periods with rare  min A Baseline:  Goal status: Ongoing   4.  Improve score on Cognitive function PROM by 4 points Baseline: 101- filled out by spouse Goal status: Ongoing   ASSESSMENT:   CLINICAL IMPRESSION: Patient is a 76 y.o. male who is seen for dysarthria/voice and cognitive linguistics s/p CVA. See tx note. Today pt req'd min A rarely with PHoRTE HEP. SLP reinstated memory compensation goal as pt has had difficulty today and last session with calendar tasks. Corrigan is a Therapist, nutritional and sings for fun and at church. If pt's voice does not show marked improvement in 4 weeks, ENT consult may be requested/suggested. I recommend cont'd skilled ST to maximize voice and cognition for safety, to reduce caregiver burden and QOL.    OBJECTIVE IMPAIRMENTS include attention, memory, awareness, dysarthria, and voice disorder. These impairments are limiting patient from managing appointments, household responsibilities, and effectively communicating at home and in community. Factors affecting potential to achieve goals and functional outcome are ability to learn/carryover information.. Patient will benefit from skilled SLP services to address above impairments and improve overall function.   REHAB POTENTIAL:  Good   PLAN: SLP FREQUENCY: 2x/week   SLP DURATION: 12 weeks   PLANNED INTERVENTIONS: Language facilitation, Environmental controls, Cueing hierachy, Cognitive reorganization, Internal/external aids, Functional tasks, Multimodal communication approach, and SLP instruction and feedback         [place], [place] 06/25/2022, 4:22 PM

## 2022-06-27 ENCOUNTER — Inpatient Hospital Stay: Payer: Medicare Other | Attending: Internal Medicine

## 2022-06-27 ENCOUNTER — Inpatient Hospital Stay: Payer: Medicare Other

## 2022-06-27 ENCOUNTER — Encounter: Payer: Self-pay | Admitting: Internal Medicine

## 2022-06-27 ENCOUNTER — Inpatient Hospital Stay: Payer: Medicare Other | Admitting: Internal Medicine

## 2022-06-27 ENCOUNTER — Other Ambulatory Visit: Payer: Self-pay

## 2022-06-27 VITALS — BP 132/64 | HR 59 | Temp 98.7°F | Resp 18 | Ht 69.0 in | Wt 206.6 lb

## 2022-06-27 DIAGNOSIS — R278 Other lack of coordination: Secondary | ICD-10-CM | POA: Insufficient documentation

## 2022-06-27 DIAGNOSIS — D461 Refractory anemia with ring sideroblasts: Secondary | ICD-10-CM | POA: Insufficient documentation

## 2022-06-27 DIAGNOSIS — R471 Dysarthria and anarthria: Secondary | ICD-10-CM | POA: Diagnosis not present

## 2022-06-27 DIAGNOSIS — D469 Myelodysplastic syndrome, unspecified: Secondary | ICD-10-CM

## 2022-06-27 DIAGNOSIS — I69254 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: Secondary | ICD-10-CM | POA: Insufficient documentation

## 2022-06-27 DIAGNOSIS — R2689 Other abnormalities of gait and mobility: Secondary | ICD-10-CM | POA: Diagnosis not present

## 2022-06-27 DIAGNOSIS — M6281 Muscle weakness (generalized): Secondary | ICD-10-CM | POA: Diagnosis not present

## 2022-06-27 DIAGNOSIS — D46Z Other myelodysplastic syndromes: Secondary | ICD-10-CM

## 2022-06-27 DIAGNOSIS — R41841 Cognitive communication deficit: Secondary | ICD-10-CM | POA: Insufficient documentation

## 2022-06-27 DIAGNOSIS — D539 Nutritional anemia, unspecified: Secondary | ICD-10-CM

## 2022-06-27 DIAGNOSIS — R2681 Unsteadiness on feet: Secondary | ICD-10-CM | POA: Diagnosis not present

## 2022-06-27 LAB — CBC WITH DIFFERENTIAL/PLATELET
Abs Immature Granulocytes: 0.03 10*3/uL (ref 0.00–0.07)
Basophils Absolute: 0 10*3/uL (ref 0.0–0.1)
Basophils Relative: 1 %
Eosinophils Absolute: 0.1 10*3/uL (ref 0.0–0.5)
Eosinophils Relative: 2 %
HCT: 28 % — ABNORMAL LOW (ref 39.0–52.0)
Hemoglobin: 9.2 g/dL — ABNORMAL LOW (ref 13.0–17.0)
Immature Granulocytes: 1 %
Lymphocytes Relative: 26 %
Lymphs Abs: 1.7 10*3/uL (ref 0.7–4.0)
MCH: 33.8 pg (ref 26.0–34.0)
MCHC: 32.9 g/dL (ref 30.0–36.0)
MCV: 102.9 fL — ABNORMAL HIGH (ref 80.0–100.0)
Monocytes Absolute: 0.5 10*3/uL (ref 0.1–1.0)
Monocytes Relative: 8 %
Neutro Abs: 4.2 10*3/uL (ref 1.7–7.7)
Neutrophils Relative %: 62 %
Platelets: 292 10*3/uL (ref 150–400)
RBC: 2.72 MIL/uL — ABNORMAL LOW (ref 4.22–5.81)
RDW: 25.2 % — ABNORMAL HIGH (ref 11.5–15.5)
WBC: 6.6 10*3/uL (ref 4.0–10.5)
nRBC: 0.3 % — ABNORMAL HIGH (ref 0.0–0.2)

## 2022-06-27 LAB — IRON AND TIBC
Iron: 75 ug/dL (ref 45–182)
Saturation Ratios: 25 % (ref 17.9–39.5)
TIBC: 297 ug/dL (ref 250–450)
UIBC: 222 ug/dL

## 2022-06-27 LAB — VITAMIN B12: Vitamin B-12: 782 pg/mL (ref 180–914)

## 2022-06-27 LAB — BASIC METABOLIC PANEL
Anion gap: 7 (ref 5–15)
BUN: 28 mg/dL — ABNORMAL HIGH (ref 8–23)
CO2: 24 mmol/L (ref 22–32)
Calcium: 9.2 mg/dL (ref 8.9–10.3)
Chloride: 108 mmol/L (ref 98–111)
Creatinine, Ser: 1.35 mg/dL — ABNORMAL HIGH (ref 0.61–1.24)
GFR, Estimated: 54 mL/min — ABNORMAL LOW (ref >60)
Glucose, Bld: 98 mg/dL (ref 70–99)
Potassium: 4.3 mmol/L (ref 3.5–5.1)
Sodium: 139 mmol/L (ref 135–145)

## 2022-06-27 LAB — FERRITIN: Ferritin: 1056 ng/mL — ABNORMAL HIGH (ref 24–336)

## 2022-06-27 MED ORDER — DARBEPOETIN ALFA 100 MCG/0.5ML IJ SOSY
100.0000 ug | PREFILLED_SYRINGE | Freq: Once | INTRAMUSCULAR | Status: AC
  Administered 2022-06-27: 100 ug via SUBCUTANEOUS
  Filled 2022-06-27: qty 0.5

## 2022-06-27 NOTE — Progress Notes (Signed)
Patient denies new problems/concerns today.   °

## 2022-06-27 NOTE — Progress Notes (Signed)
[facility] NOTE  Patient Care Team: Baxter Hire, MD as PCP - General (Internal Medicine) Vickie Epley, MD as PCP - Electrophysiology (Cardiology) Cammie Sickle, MD as Consulting Physician (Hematology and Oncology)  CHIEF COMPLAINTS/PURPOSE OF CONSULTATION: MDS  Oncology History Overview Note  # EGD-none; colonoscopy-never [Cologurad x2- NEG];FEB 2021- US- abdomen-no liver disease/ Mild splenic enlargement [460cc; kidney cysts]; Z22 folic acid/LDH haptoglobin normal.  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    # NGS/MOLECULAR TESTS:[facility] ON hem- May 2021- Cux-1; DNMT3A; SF3B**  # PALLIATIVE CARE EVALUATION: NA   DIAGNOSIS: MDS low risk   GOALS: Control  CURRENT/MOST RECENT THERAPY : Retacrit    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[place])  02/18/2020 Initial Diagnosis   MDS (myelodysplastic syndrome), low grade (HCC)     HISTORY OF PRESENTING ILLNESS: Ambulating i in a wheelchair.  Accompanied by his wife.  Vernon [name] 76 y.o.  male with history of recently diagnosed low-grade MDS; A-fib on Eliquis currently on Retacrit is here for follow-up.  In the interim patient had a spontaneous intracranial bleed needing evacuation.  Patient's intracranial bleed was thought to be related to Eliquis.  Patient did not have any trauma.  Patient has been in rehab currently home for the last 2 months.  Denies any worsening fatigue.  Denies any nausea vomiting abdominal pain.  No weight loss.  No worsening shortness of breath or cough.  Review of Systems  Constitutional:   Positive for malaise/fatigue. Negative for chills, diaphoresis, fever and weight loss.  HENT:  Negative for nosebleeds and sore throat.   Eyes:  Negative for double vision.  Respiratory:  Negative for cough, hemoptysis, sputum production, shortness of breath and wheezing.   Cardiovascular:  Negative for chest pain, palpitations, orthopnea and leg swelling.  Gastrointestinal:  Negative for abdominal pain, blood in stool, constipation, diarrhea, heartburn, melena, nausea and vomiting.  Genitourinary:  Negative for dysuria, frequency and urgency.  Musculoskeletal:  Negative for back pain and joint pain.  Skin: Negative.  Negative for itching and rash.  Neurological:  Negative for dizziness, tingling, focal weakness, weakness and headaches.  Endo/Heme/Allergies:  Does not bruise/bleed easily.  Psychiatric/Behavioral:  Negative for depression. The patient is not nervous/anxious and does not have insomnia.     MEDICAL HISTORY:  Past Medical History:  Diagnosis Date   Anemia    Aortic stenosis    Arthritis    Complication of anesthesia    hard time getting bp up after knee replacement   Diabetes mellitus without complication (HCC)    GERD (gastroesophageal reflux disease)    occ tums prn   History of hiatal hernia    Hypertension    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[place])     SURGICAL HISTORY: Past Surgical History:  Procedure Laterality Date   CARPAL TUNNEL RELEASE  2012   CRANIOTOMY N/A 02/10/2022   Procedure: SUBOCCIPITAL CRANIECTOMY FOR EVACUATION OF CEREBELLAR HEMATOMA;  Surgeon: Kristeen Miss, MD;  Location: [facility];  Service: Neurosurgery;  Laterality: N/A;   JOINT REPLACEMENT Right 2010   SHOULDER ARTHROSCOPY WITH ROTATOR CUFF REPAIR AND OPEN BICEPS TENODESIS Right 11/09/2019   Procedure: RIGHT SHOULDER ARTHROSCOPY WITH SUBSCAPULARIS REPAIR, SUBACROMIAL DECOMPRESSION,MINI  OPEN ROTATOR CUFF REPAIR;  Surgeon: Leim Fabry, MD;  Location: [facility];  Service: Orthopedics;  Laterality:  Right;    SOCIAL HISTORY: Social History   Socioeconomic History   Marital status: Married    Spouse name: Not on file   Number of children: Not on file   Years of education: Not on file   Highest education level: Not on file  Occupational History   Not on file  Tobacco Use   Smoking status: Former    Packs/day: 0.50    Years: 8.00    Total pack years: 4.00    Types: Cigarettes    Quit date: 07/21/1978    Years since quitting: 43.9   Smokeless tobacco: Never  Vaping Use   Vaping Use: Never used  Substance and Sexual Activity   Alcohol use: Yes    Alcohol/week: 0.0 - 1.0 standard drinks of alcohol    Comment: rare beer   Drug use: Never   Sexual activity: Not on file  Other Topics Concern   Not on file  Social History Narrative   Lives in [place]; with wife; quit smoking in early 55s; ocassional/ rare [may be 1 a month beer]. 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    Social Determinants of Health   Financial Resource Strain: Not on file  Food Insecurity: Not on file  Transportation Needs: Not on file  Physical Activity: Not on file  Stress: Not on file  Social Connections: Not on file  Intimate Partner Violence: Not on file    FAMILY HISTORY: Family History  Problem Relation Age of Onset   Cancer Maternal Uncle     ALLERGIES:  has No Known Allergies.  MEDICATIONS:  Current Outpatient Medications  Medication Sig Dispense Refill   ACCU-CHEK GUIDE test strip USE 1 STRIP 3 TIMES A DAY AS INSTRUCTED     apixaban (ELIQUIS) 5 MG TABS tablet Take 5 mg by mouth 2 (two) times daily.      blood glucose meter kit and supplies KIT Dispense based on patient and insurance preference. Use up to four times daily as directed. 1 each 0   Insulin Pen Needle (CAREFINE PEN NEEDLES) 32G X 5 MM MISC 1 application. by Does not apply route daily after breakfast. 30 each 0   metFORMIN (GLUCOPHAGE) 500 MG tablet Take 1 tablet by mouth 2 (two) times daily with a meal.      acetaminophen (TYLENOL) 650 MG CR tablet Take 650 mg by mouth every 8 (eight) hours as needed for pain. (Patient not taking: Reported on 06/21/2022)     allopurinol (ZYLOPRIM) 100 MG tablet Take 100 mg by mouth daily. (Patient not taking: Reported on 06/07/2022)     ondansetron (ZOFRAN) 4 MG tablet Take 4 mg by mouth every 8 (eight) hours as needed. (Patient not taking: Reported on 06/21/2022)     polyethylene glycol (MIRALAX / GLYCOLAX) 17 g packet Take 17 g by mouth daily. (Patient not taking: Reported on 06/21/2022) 30 each 0   senna-docusate (SENOKOT-S) 8.6-50 MG tablet Take 1 tablet by mouth 2 (two) times daily. (Patient not taking: Reported on 06/21/2022) 60 tablet 0   No current facility-administered medications for this visit.      PHYSICAL EXAMINATION:   Vitals:   06/27/22 1414  BP: 132/64  Pulse: (!) 59  Resp: 18  Temp: 98.7 F (37.1 C)   Filed Weights   06/27/22 1414  Weight: 206 lb 9.6 oz (93.7 kg)    Physical Exam HENT:  Head: Normocephalic and atraumatic.     Mouth/Throat:     Pharynx: No oropharyngeal exudate.  Eyes:     Pupils: Pupils are equal, round, and reactive to light.  Cardiovascular:     Rate and Rhythm: Normal rate and regular rhythm.  Pulmonary:     Effort: Pulmonary effort is normal. No respiratory distress.     Breath sounds: Normal breath sounds. No wheezing.  Abdominal:     General: Bowel sounds are normal. There is no distension.     Palpations: Abdomen is soft. There is no mass.     Tenderness: There is no abdominal tenderness. There is no guarding or rebound.  Musculoskeletal:        General: No tenderness. Normal range of motion.     Cervical back: Normal range of motion and neck supple.  Skin:    General: Skin is warm.  Neurological:     Mental Status: He is alert and oriented to person, place, and time.  Psychiatric:        Mood and Affect: Affect normal.    LABORATORY DATA:  I have reviewed the data as listed Lab Results   Component Value Date   WBC 6.6 06/27/2022   HGB 9.2 (L) 06/27/2022   HCT 28.0 (L) 06/27/2022   MCV 102.9 (H) 06/27/2022   PLT 292 06/27/2022   Recent Labs    02/20/22 0348 02/21/22 0117 02/22/22 0442 02/27/22 0520 03/12/22 0513 03/19/22 0617 04/04/22 1303 06/21/22 1122 06/27/22 1412  NA 146* 142   < > 139   < > 139 134* 143 139  K 4.9 5.2*   < > 4.9   < > 4.9 4.4 4.8 4.3  CL 113* 112*   < > 106   < > 110 108 106 108  CO2 25 25   < > 27   < > 22 18* 21 24  GLUCOSE 144* 121*   < > 175*   < > 150* 174* 135* 98  BUN 35* 35*   < > 41*   < > 36* 65* 34* 28*  CREATININE 1.36* 1.35*   < > 1.69*   < > 2.10* 1.47* 1.44* 1.35*  CALCIUM 9.5 9.4   < > 9.0   < > 9.0 8.9 10.0 9.2  GFRNONAA 54* 55*   < > 42*   < > 32* 49*  --  54*  PROT 6.5 6.7  --  6.6  --   --   --   --   --   ALBUMIN 3.3* 3.4*  --  3.2*  --   --   --   --   --   AST 45* 43*  --  22  --   --   --   --   --   ALT 31 33  --  24  --   --   --   --   --   ALKPHOS 132* 147*  --  162*  --   --   --   --   --   BILITOT 1.9* 1.7*  --  2.0*  --   --   --   --   --    < > = values in this interval not displayed.     No results found.  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  Patient currently on Retacrit [since April 2021]; hemoglobin between 8-9.  Patient is fairly asymptomatic. STABLE.  Continue Retacrit every 2 weeks.  # Discussed with the patient that if he gets more fatigue/symptomatic the options include Luspatercept/Revlimid.  #Today hemoglobin is 9.2- STABLE. Continue Aranesp for now.  FEB 2022- Ferritin-900; [recently started Fe/B12] will recheck iron studies/B12 in 3 months.  # Intracranial bleed while on Eliquis/Hx of stroke s/p evacuation- [at home > 2 months]-   # Hx Of A.fib [OFF eliquis secondary to intracranial bleed]- awaiting watchman device.  # Stage kidney disease-stage III [dr.Korrapati]-STABLE  #  DISPOSITION:  # aranesp today # in 2 week- lab- H&H; possible aranesp # in 4 weeks- lab- H&H; possible aranesp # in 6 weeks- lab- H&H; possible aranesp # in 8 weeks- lab- H&H; possible aranesp # in 10 weeks- lab- H&H; possible aranesp # # in 12  weeks- MD; labs-cbc/bmp/ aranesp;;iron studies/ferritin; B12 levels-Dr.B  All questions were answered. The patient knows to call the clinic with any problems, questions or concerns.    Cammie Sickle, MD 06/27/2022 3:19 PM

## 2022-06-27 NOTE — Assessment & Plan Note (Addendum)
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  Patient currently on Retacrit [since April 2021]; hemoglobin between 8-9.  Patient is fairly asymptomatic. STABLE.  Continue Retacrit every 2 weeks.  # Discussed with the patient that if he gets more fatigue/symptomatic the options include Luspatercept/Revlimid.  #Today hemoglobin is 9.2- STABLE. Continue Aranesp for now.  FEB 2022- Ferritin-900; [recently started Fe/B12] will recheck iron studies/B12 in 3 months.  # Intracranial bleed while on Eliquis/Hx of stroke s/p evacuation- [at home > 2 months]-   # Hx Of A.fib [OFF eliquis secondary to intracranial bleed]- awaiting watchman device.  # Stage kidney disease-stage III [dr.Korrapati]-STABLE  # DISPOSITION:  # aranesp today # in 2 week- lab- H&H; possible aranesp # in 4 weeks- lab- H&H; possible aranesp # in 6 weeks- lab- H&H; possible aranesp # in 8 weeks- lab- H&H; possible aranesp # in 10 weeks- lab- H&H; possible aranesp # # in 12  weeks- MD; labs-cbc/bmp/ aranesp;;iron studies/ferritin; B12 levels-Dr.B

## 2022-06-28 ENCOUNTER — Ambulatory Visit: Payer: Medicare Other

## 2022-06-28 ENCOUNTER — Ambulatory Visit: Payer: Medicare Other | Admitting: Occupational Therapy

## 2022-06-28 DIAGNOSIS — R41841 Cognitive communication deficit: Secondary | ICD-10-CM

## 2022-06-28 DIAGNOSIS — R471 Dysarthria and anarthria: Secondary | ICD-10-CM

## 2022-06-28 DIAGNOSIS — R2681 Unsteadiness on feet: Secondary | ICD-10-CM | POA: Insufficient documentation

## 2022-06-28 DIAGNOSIS — R262 Difficulty in walking, not elsewhere classified: Secondary | ICD-10-CM

## 2022-06-28 DIAGNOSIS — I69254 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: Secondary | ICD-10-CM

## 2022-06-28 DIAGNOSIS — M6281 Muscle weakness (generalized): Secondary | ICD-10-CM | POA: Insufficient documentation

## 2022-06-28 DIAGNOSIS — R2689 Other abnormalities of gait and mobility: Secondary | ICD-10-CM | POA: Insufficient documentation

## 2022-06-28 DIAGNOSIS — D461 Refractory anemia with ring sideroblasts: Secondary | ICD-10-CM | POA: Diagnosis not present

## 2022-06-28 DIAGNOSIS — R278 Other lack of coordination: Secondary | ICD-10-CM | POA: Insufficient documentation

## 2022-06-28 NOTE — Therapy (Signed)
OUTPATIENT PHYSICAL THERAPY TREATMENT NOTE   Patient Name: Vernon [name] MRN: [id number] DOB:05/06/1946, 76 y.o., male Today's Date: 06/28/2022  PCP: Johnston, John D, MD  REFERRING PROVIDER: Johnston, John D, MD   END OF SESSION:   PT End of Session - 06/28/22 1622     Visit Number 14    Number of Visits 16    Date for PT Re-Evaluation 07/03/22    Authorization Type UHC Medicare    Progress Note Due on Visit 19    PT Start Time 1615    PT Stop Time 1700    PT Time Calculation (min) 45 min    Equipment Utilized During Treatment Gait belt    Activity Tolerance Patient tolerated treatment well    Behavior During Therapy WFL for tasks assessed/performed;Impulsive                Past Medical History:  Diagnosis Date   Anemia    Aortic stenosis    Arthritis    Complication of anesthesia    hard time getting bp up after knee replacement   Diabetes mellitus without complication (HCC)    GERD (gastroesophageal reflux disease)    occ tums prn   History of hiatal hernia    Hypertension    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HCC)    Past Surgical History:  Procedure Laterality Date   CARPAL TUNNEL RELEASE  2012   CRANIOTOMY N/A 02/10/2022   Procedure: SUBOCCIPITAL CRANIECTOMY FOR EVACUATION OF CEREBELLAR HEMATOMA;  Surgeon: Elsner, Henry, MD;  Location: [facility];  Service: Neurosurgery;  Laterality: N/A;   JOINT REPLACEMENT Right 2010   SHOULDER ARTHROSCOPY WITH ROTATOR CUFF REPAIR AND OPEN BICEPS TENODESIS Right 11/09/2019   Procedure: RIGHT SHOULDER ARTHROSCOPY WITH SUBSCAPULARIS REPAIR, SUBACROMIAL DECOMPRESSION,MINI OPEN ROTATOR CUFF REPAIR;  Surgeon: Patel, Sunny, MD;  Location: [facility];  Service: Orthopedics;  Laterality: Right;   Patient Active Problem List   Diagnosis Date Noted   Proteinuria, unspecified 05/23/2022   Simple renal cyst 05/23/2022   Long term current use of anticoagulant 04/04/2022   Rotator cuff syndrome 04/04/2022   Exposure to potentially  hazardous substance 04/04/2022   Gout 04/04/2022   Osteoarthritis 04/04/2022   Osteoporosis 04/04/2022   Other specified diseases of hair and hair follicles 04/04/2022   Pain in joint, lower leg 04/04/2022   Problem related to unspecified psychosocial circumstances 04/04/2022   General medical examination for administrative purposes 04/04/2022   Stage 3b chronic kidney disease (HCC)    Dysphagia, post-stroke    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   Cough 02/20/2022   Acute metabolic encephalopathy 02/20/2022   Obstructive hydrocephalus (HCC) 02/19/2022   Dyslipidemia 02/19/2022   Dysphagia 02/19/2022   AKI (acute kidney injury) (HCC)    Hyperkalemia    Anemia    Pleural effusion on right    Respiratory failure requiring intubation (HCC)    Cerebral edema (HCC)    Chronic atrial fibrillation (HCC)    Controlled type 2 diabetes mellitus with hyperglycemia, without long-term current use of insulin (HCC)    ICH (intracerebral hemorrhage) (HCC) 02/10/2022   Intracranial hemorrhage (HCC)    Anticoagulated    Moderate tricuspid regurgitation 07/26/2021   Moderate aortic valve stenosis 03/08/2020   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HCC) 02/18/2020   Macrocytic anemia 01/13/2020   Spleen enlargement 01/13/2020   Bilateral carotid artery stenosis 07/14/2018   Atrial fibrillation, chronic (HCC) 07/14/2018   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   Essential hypertension 08/16/2014     Hyperlipemia, mixed 08/16/2014   Renal insufficiency 08/16/2014    REFERRING DIAG: I62.9 (ICD-10-CM) - Nontraumatic intracranial hemorrhage, unspecified   THERAPY DIAG:  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 No pain in sitting; 3/10 pain with walking in L knee  OBJECTIVE:   TODAY'S TREATMENT: 06/28/22 Activity Comments  Obstacle course w/ RW 4 hurdles, turn 90 deg on foam, step up/down 6"  Seated dynamic balance Unsupported sitting performing reactive reaching/balance/postural correction by hitting balloon x 4 min  Dynamic standing balance Standing at sink weight shifting side-side with hitting ball to overhead target. Weight shift and reach across midline to target 3x10 reps--cues for pace and coordinating breathing control for accuracy  Gait training w/ RW and SBA-CGA on level surfaces Emphasis on negotiating obstacles and performing head turns. Curb negotiation w/ SBA               PATIENT EDUCATION: Education details: Larger wheels for his current RW (has small wheels on it, and larger wheels may help increase steadiness) Person educated: Patient Education method: Explanation Education comprehension: verbalized understanding   -------------------------------------------------------------------------------------------------------------------------   From eval  DIAGNOSTIC FINDINGS: see MAR   COGNITION: Overall cognitive status: Within functional limits for tasks assessed             SENSATION: WFL   COORDINATION: Bradykinesia, difficulty wit rapid alternating movements     MUSCLE TONE: WFL       POSTURE: No Significant postural limitations   LOWER EXTREMITY ROM:      WFL  (Blank rows = not tested)   LOWER EXTREMITY MMT:     MMT Right Eval Left Eval  Hip flexion 5 4  Hip extension 5 3+  Hip abduction 5 3-  Hip adduction      Hip internal rotation      Hip external rotation      Knee flexion 5 4  Knee extension 5 4  Ankle dorsiflexion 5 4+  Ankle plantarflexion      Ankle inversion      Ankle  eversion      (Blank rows = not tested)   BED MOBILITY:  Independent   TRANSFERS: Assistive device utilized: Environmental consultant - 2 wheeled  Sit to stand: Modified independence Stand to sit: Modified independence Chair to chair: SBA and CGA Floor:  DNT   RAMP:  Not available   CURB:  Level of Assistance: SBA and CGA Assistive device utilized: Environmental consultant - 2 wheeled Curb Comments: undershoots with LLE   STAIRS:           Level of Assistance: SBA and CGA           Stair Negotiation Technique: Alternating Pattern  with Bilateral Rails           Number of Stairs: 6             Height of Stairs: 4-6"            Comments: dysmetria w/ LLE   GAIT: Gait pattern: circumduction- Left and ataxic Distance walked: 100 Assistive device utilized: Walker - 2 wheeled Level of assistance: SBA Comments: dysmetria LLE   FUNCTIONAL  TESTs:  Timed up and go (TUG): 20.25 with RW Berg Balance Scale: 35/56      M-CTSIB  Condition 1: Firm Surface, EO 30 Sec, Mild Sway  Condition 2: Firm Surface, EC 30 Sec, Mild and Moderate Sway  Condition 3: Foam Surface, EO 9 Sec, Severe Sway  Condition 4: Foam Surface, EC 0 Sec,  unable  Sway    PATIENT SURVEYS:  FOTO 54.43% functional status   TODAY'S TREATMENT:  Evaluation, sidestepping along counter, tandem stance at counter     PATIENT EDUCATION: Education details: scope of PT intervention Person educated: Patient and Spouse Education method: Explanation Education comprehension: verbalized understanding     HOME EXERCISE PROGRAM: sidestepping along counter, tandem stance at counter   -----------------------------------------------------------------------------------------------------------------------    GOALS: Goals reviewed with patient? Yes   SHORT TERM GOALS: Target date: 05/29/2022   Patient will perform HEP with family/caregiver supervision for improved strength, balance, transfers, and gait  Baseline: Goal status: MET   2.  Patient will  achieve 15 seconds for TUG test to manifest reduced risk for falls Baseline: 20.25 with RW; 14 sec with RW Goal status: MET   3. Demo modified independent gait level surfaces and curb negotiation            Baseline: CGA-SBA with RW            Goal status: On-going   LONG TERM GOALS: Target date: 06/26/2022   Patient will demonstrate score 45/56 Berg Balance Test to manifest low risk for falls Baseline: 35/56; 37/56 on 06/06/22 Goal status: On-going   2.  Demonstrate improved static balance and postural control per time of 15 sec condition 4 M-CTSIB to improve safety with mobility on uneven surfaces Baseline: complete LOB left with condition 3 Goal status: On-going   3.  Demo modified independent gait using least restrictive AD over various surfaces and ascend/descend stairs with set-up assist to improve functional mobility Baseline:  Goal status: on-going   4.  Demo score of 57% functional status FOTO  Baseline: 54.43% Goal status: INITIAL       ASSESSMENT:   CLINICAL IMPRESSION: Improving in ability to perform movements crossing midline and regaining postural control and LLE precision of movement w/ decrease in LLE ataxia and LOB especially when performing head turns as compared to previous weeks which resulted in frequent and multiple LOB as evidenced by ability to now ambulate at more of a supervision level on smooth, open surfaces. Progressing with balance and motor control leading to enhanced dynamic balance. Continued sessions indicated to facilitate improved motor control and reduce risk for falls during gait and transfers   OBJECTIVE IMPAIRMENTS Abnormal gait, decreased activity tolerance, decreased balance, decreased coordination, decreased endurance, decreased knowledge of use of DME, difficulty walking, decreased strength, impaired perceived functional ability, and improper body mechanics.    ACTIVITY LIMITATIONS carrying, lifting, bending, standing, squatting, stairs,  transfers, and locomotion level   PARTICIPATION LIMITATIONS: meal prep, cleaning, laundry, driving, shopping, community activity, and yard work   PERSONAL FACTORS Age, Fitness, and Time since onset of injury/illness/exacerbation are also affecting patient's functional outcome.    REHAB POTENTIAL: Excellent   CLINICAL DECISION MAKING: Evolving/moderate complexity   EVALUATION COMPLEXITY: Moderate   PLAN: PT FREQUENCY: 1-2x/week   PT DURATION: 9 weeks   PLANNED INTERVENTIONS: Therapeutic exercises, Therapeutic activity, Neuromuscular re-education, Balance training, Gait training, Patient/Family education, Joint mobilization, Stair training, Vestibular training, Canalith repositioning, DME instructions, Aquatic Therapy, Electrical stimulation, Wheelchair mobility training, and Manual therapy  PLAN FOR NEXT SESSION:  RECERT.  training with gait in/out of clinic to negotiate curbs and sidewalk and ability to enter clinic unassisted by staff. Stair ambulation.                  4:23 PM, 06/28/22 M. Kelly , PT, DPT Physical Therapist- [place] Office Number: [phone]     Outpatient Rehab at [facility] [address], [address] [place], [place] [address] Phone # [phone] Fax # [phone]       

## 2022-06-28 NOTE — Therapy (Signed)
OUTPATIENT SPEECH LANGUAGE PATHOLOGY TREATMENT NOTE   Patient Name: Vernon [name] MRN: [id number] DOB:[birth year], 76 y.o., male Today's Date: 06/28/2022  PCP: Vernon Hire, MD  REFERRING PROVIDER: Baxter Hire, MD   END OF SESSION:   End of Session - 06/28/22 1501     Visit Number 12    Number of Visits 25    Date for SLP Re-Evaluation 07/26/22    Authorization Type UHC    SLP Start Time 1457   pt sitting in car - driver not comfortable bringing pt in   SLP Stop Time  1530    SLP Time Calculation (min) 33 min    Activity Tolerance Patient tolerated treatment well                 Past Medical History:  Diagnosis Date   Anemia    Aortic stenosis    Arthritis    Complication of anesthesia    hard time getting bp up after knee replacement   Diabetes mellitus without complication (HCC)    GERD (gastroesophageal reflux disease)    occ tums prn   History of hiatal hernia    Hypertension    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[place])    Past Surgical History:  Procedure Laterality Date   CARPAL TUNNEL RELEASE  2012   CRANIOTOMY N/A 02/10/2022   Procedure: SUBOCCIPITAL CRANIECTOMY FOR EVACUATION OF CEREBELLAR HEMATOMA;  Surgeon: Vernon Miss, MD;  Location: [place];  Service: Neurosurgery;  Laterality: N/A;   JOINT REPLACEMENT Right 2010   SHOULDER ARTHROSCOPY WITH ROTATOR CUFF REPAIR AND OPEN BICEPS TENODESIS Right 11/09/2019   Procedure: RIGHT SHOULDER ARTHROSCOPY WITH SUBSCAPULARIS REPAIR, SUBACROMIAL DECOMPRESSION,MINI OPEN ROTATOR CUFF REPAIR;  Surgeon: Vernon Fabry, MD;  Location: [facility];  Service: Orthopedics;  Laterality: Right;   Patient Active Problem List   Diagnosis Date Noted   Proteinuria, unspecified 05/23/2022   Simple renal cyst 05/23/2022   Long term current use of anticoagulant 04/04/2022   Rotator cuff syndrome 04/04/2022   Exposure to potentially hazardous substance 04/04/2022   Gout 04/04/2022   Osteoarthritis 04/04/2022   Osteoporosis  04/04/2022   Other specified diseases of hair and hair follicles 97/58/8325   Pain in joint, lower leg 04/04/2022   Problem related to unspecified psychosocial circumstances 04/04/2022   General medical examination for administrative purposes 04/04/2022   Stage 3b chronic kidney disease ([place])    Dysphagia, post-stroke    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 hemorrhage of brain ([place]) 02/26/2022   Cough 49/82/6415   Acute metabolic encephalopathy 83/07/4075   Obstructive hydrocephalus ([place]) 02/19/2022   Dyslipidemia 02/19/2022   Dysphagia 02/19/2022   AKI (acute kidney injury) ([place])    Hyperkalemia    Anemia    Pleural effusion on right    Respiratory failure requiring intubation (HCC)    Cerebral edema (HCC)    Chronic atrial fibrillation ([place])    Controlled type 2 diabetes mellitus with hyperglycemia, without long-term current use of insulin (HCC)    ICH (intracerebral hemorrhage) ([place]) 02/10/2022   Intracranial hemorrhage (HCC)    Anticoagulated    Moderate tricuspid regurgitation 07/26/2021   Moderate aortic valve stenosis 03/08/2020   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[place]) 02/18/2020   Macrocytic anemia 01/13/2020   Spleen enlargement 01/13/2020   Bilateral carotid artery stenosis 07/14/2018   Atrial fibrillation, chronic ([place]) 07/14/2018   Type 2 diabetes with nephropathy ([place]) 02/14/2016   Essential hypertension 08/16/2014   Hyperlipemia, mixed 08/16/2014   Renal insufficiency 08/16/2014    PERTINENT HISTORY:  March  2023 experienced hemorrhage in cerebellar peduncle and underwent craniotomy for evacuation. Notes his LUE and LLE are most affected at present. Underwent acute hospitalization, and [facility]. Recently D/C from home health on 04/30/22. Did not receive HHST, only HHPT/OT.     ONSET DATE: 02/10/22   REFERRING DIAG: I62.9 (ICD-10-CM) - Nontraumatic intracranial hemorrhage, unspecified   THERAPY DIAG:  Dysarthria and anarthria  Cognitive communication deficit  Rationale  for Evaluation and Treatment Rehabilitation  SUBJECTIVE: "I have some aides that the [place] pays for on Mondays and Thursdays."   PAIN:  Are you having pain? No   OBJECTIVE:   TODAY'S TREATMENT: 06/28/22: Pt was late getting into ST today due to aide not comfortable transferring pt/walking pt in to clinic. SLP provided occasional min- mod A faded to rare min A for abdominal breathing with PHOrTE tasks, but other procedural steps were WNL. With calendar tasks, pt tracked appointments WNL, but had mistake that he was unaware of re: end time.  06/25/22: SLP reminded pt that he had difficulty last session with ID that day's therapy sessions, when he returned, etc. SLP asked pt to tell the sessions today which he did correctly. When SLP asked pt to tell the sequence of next appointments, he was incorrect and req'd SLP A for correction. This occurred due to pt taking out the *entire page* of a horizontal bifold calendar to look at what appointments were for next month- thus pt was looking at [facility] months and req'd correction for error awareness.  Pt tells SLP he maintains BID HEP completion. Pt states he feels stronger voice now with upper and lower pitches and the middle of the range is where he feels the weakest. Vernon [name] came in to [place] and she and pt did HEP together. Pt req'd min A rarely for making sure low-hi-low numbers started at lowest pitch, slowing sentence reps, and that he was using abdominal breathing (AB) for all productions. Vernon [name] stated pt had difficulty balancing checkbook ("for every 4 right thing sthere would be one wrong one"). SLP explained Vernon [name] is having more difficulty with error awareness and attention to details at this time and that was another example (first being calendar task today).   06/21/22: "I have to get a CT and then they will tell me what the plan is (for cardiac sx)." Pt with WNL voicing for intermittent segments of 20-30 seconds during ST today, x9. SLP had pt  perform the HEP (PHoRTE) and pt req'd rare min A; usually SLP needing to provide more intense cueing than that level - SLP attributes this to pt performing with wife correctly, and consistently. Pt req'd mod-max A tfor appointment tracking - told SLP that  Monday's appointments were today's and they were wrong on the sheet. When SLP asked pt for the date today, pt looked at SLP calendar and then pt looked elsewhere for today's date and then realized he was telling me appointments for next week and not for today.  Pt repeated story to SLP he told earlier in session x1.    06/14/22: SLP engaged pt with his PHoRTE HEP today in presence of wife, given the fact Neko was not performing HEP correctly spontaneously previous session. SLP reiterated for pt/wife today how important correct completion of PHoRTE is, and that if pt's voice is not notably improved in 4 weeks ENT consult may be warranted. With occasional min A faded to rare min A for breath support, and occasional min A for "sigh" warm ups  and for pitch lo-hi-lo numbers, pt completed HEP. fPrior to this, pt req'd shaping of abdominal breathing (AB) due to clavicular/thoracic breathing. Wife asking questions about PHoRTE throughout session and SLP answering these. SLP told Alexande that [name] needed to "play the role of [name]" if pt is not completing exercises correctly at home. Pt acknowledged this.  Pt stated SLP did not put his PHoRTE HEP back in his folder after previous session but HEP was in pocket inside front cover of notebook.  06/11/22: [name] states he has been completing PHoRTE BID, however was not completing the numbers (lo-hi-lo pitch) correctly - he was saying them in a loud voice instead of varied pitch. SLP corrected pt and he was mod I by session end. Esgar req'd occasional faded to rare min-mod SLP cues for good breath prior to each rep for numbers and sentences, and to slow productions down for quality instead of rushing through reps. Loud "ah"  definitely sounds stronger than previous session with this SLP on 05-31-22. SLP also believes sigh or yawn-sigh vocalization sounds more relaxed than "waterfall" vowel. Higher pitched (calling "over the fence") rated 4/10 effort (10=most effort), and lower pitched "scolding dog" voice rated 6-7/10 effort level.  SLP worked with pt re: calendar orientation and appointment tracking. Pt req'd extra time for telling SLP date and appointment times. He req'd min cues for recall of Dr. Leonie Man appointment last week but when provided this by SLP [name] gave details that were also shown in MD note.   06/06/22: PT BROUGHT FOLDER TODAY and wanted [name] to know he remembered. Pt reports he has been completing his exercises x2/day and reports others believe he's made improvement. Completed PHoRTE exercises. Required cueing to rduce tight/strained voice, as well as to slow down for adequate breath support. Pt was able to produce "Over the fence" voice sentences with more ease, rating it a 4/10 in effort level required. Pt completed authoritative voice sentences with more difficulty, rating it a 6/10 in effort level required.   Pt felt doing a sigh vs. Waterfall was easier to complete with less tension. SLP in agreement.   05/31/22: Pt entered with his own folder today; "SEE I TOLD YOU I'D REMEMBER IT!" He had other things that he did each day written on his calendar as well. SLP introduced PHOrTE exercises to pt today (see pt instructions) and worked with him to achieve adequate breath prior to each rep (pt would rush at times and not achieve adequate breath support). SLP provided [name] with detailed instructions and also educated wife, alone in lobby at the time, on each exercise. No throat clearing the last two sessions.     PATIENT EDUCATION: Education details: See "treatment" above Person educated: Patient, wife Education method: Explanation, Demonstration, Verbal cues Education comprehension: verbalized understanding,  returned demonstration, verbal cues required, and needs further education      GOALS: Goals reviewed with patient? Yes   SHORT TERM GOALS: Target date: 05/31/22 Pt will complete HEP for voice/dysarthria with occasional min A over 2 sessions Baseline: 05/24/22 Goal status: Met   2.  Pt will use external aids to recall daily events/schedule with occasional min A from spouse over 1 week Baseline:  Goal status: Deferred   3.  Pt will achieve clear phonation 18/20 sentences in structured task Baseline:  Goal status: Not met   4.  Pt will ID dysnomic errors and self correct with rare min A over 2 sessions Baseline:  Goal status: Deferred  LONG TERM GOALS: Target date: 07/26/22 Pt will achieve clear phonation over 10 minute conversation with rare min A over 2 sessions Baseline:  Goal status: Revised   2.  Pt will independently recall weekly/daily schedule, appointments events and pertinent information with memory aids over 2 sessions Baseline:  Goal status: [place]   3.  Pt will complete vocal warm ups and follow 3 vocal hygiene strategies to return to singing for 5-10 minute periods with rare min A Baseline:  Goal status: Ongoing   4.  Improve score on Cognitive function PROM by 4 points Baseline: 101- filled out by spouse Goal status: Ongoing   ASSESSMENT:   CLINICAL IMPRESSION: Patient is a 76 y.o. male who is seen for dysarthria/voice and cognitive linguistics s/p CVA. See tx note. Today pt req'd min-mod A occasionally faded to rare min A for abdominal breathing (AB) with numbers and sentences PHoRTE HEP. Pt did not have difficulty with appointments with calendar tasks, however mis-calculated end time for his last therapy session, twice without awareness. Seab is a Therapist, nutritional and sings for fun and at church. If pt's voice does not show marked improvement in approx 3-4 weeks, ENT consult may be requested/suggested. I recommend cont'd skilled ST to maximize voice and cognition  for safety, to reduce caregiver burden and QOL.    OBJECTIVE IMPAIRMENTS include attention, memory, awareness, dysarthria, and voice disorder. These impairments are limiting patient from managing appointments, household responsibilities, and effectively communicating at home and in community. Factors affecting potential to achieve goals and functional outcome are ability to learn/carryover information.. Patient will benefit from skilled SLP services to address above impairments and improve overall function.   REHAB POTENTIAL: Good   PLAN: SLP FREQUENCY: 2x/week   SLP DURATION: 12 weeks   PLANNED INTERVENTIONS: Language facilitation, Environmental controls, Cueing hierachy, Cognitive reorganization, Internal/external aids, Functional tasks, Multimodal communication approach, and SLP instruction and feedback         [place], [place] 06/28/2022, 3:21 PM

## 2022-06-28 NOTE — Therapy (Signed)
OUTPATIENT OCCUPATIONAL THERAPY Treatment Note   Patient Name: Vernon [name] MRN: [id number] DOB:[birth year], 76 y.o., male Today's Date: 06/28/2022  PCP: Baxter Hire, MD REFERRING PROVIDER: Baxter Hire, MD      OT End of Session - 06/28/22 1541     Visit Number 13    Number of Visits 17    Date for OT Re-Evaluation 07/06/22    Authorization Type [employer] Medicare    Authorization Time Period VL: [place]    OT Start Time 1538    OT Stop Time 1617    OT Time Calculation (min) 39 min    Activity Tolerance Patient tolerated treatment well    Behavior During Therapy WFL for tasks assessed/performed                 Past Medical History:  Diagnosis Date   Anemia    Aortic stenosis    Arthritis    Complication of anesthesia    hard time getting bp up after knee replacement   Diabetes mellitus without complication (HCC)    GERD (gastroesophageal reflux disease)    occ tums prn   History of hiatal hernia    Hypertension    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[place])    Past Surgical History:  Procedure Laterality Date   CARPAL TUNNEL RELEASE  2012   CRANIOTOMY N/A 02/10/2022   Procedure: SUBOCCIPITAL CRANIECTOMY FOR EVACUATION OF CEREBELLAR HEMATOMA;  Surgeon: Kristeen Miss, MD;  Location: [facility];  Service: Neurosurgery;  Laterality: N/A;   JOINT REPLACEMENT Right 2010   SHOULDER ARTHROSCOPY WITH ROTATOR CUFF REPAIR AND OPEN BICEPS TENODESIS Right 11/09/2019   Procedure: RIGHT SHOULDER ARTHROSCOPY WITH SUBSCAPULARIS REPAIR, SUBACROMIAL DECOMPRESSION,MINI OPEN ROTATOR CUFF REPAIR;  Surgeon: Leim Fabry, MD;  Location: [facility];  Service: Orthopedics;  Laterality: Right;   Patient Active Problem List   Diagnosis Date Noted   Proteinuria, unspecified 05/23/2022   Simple renal cyst 05/23/2022   Long term current use of anticoagulant 04/04/2022   Rotator cuff syndrome 04/04/2022   Exposure to potentially hazardous substance 04/04/2022   Gout 04/04/2022   Osteoarthritis  04/04/2022   Osteoporosis 04/04/2022   Other specified diseases of hair and hair follicles 53/61/4431   Pain in joint, lower leg 04/04/2022   Problem related to unspecified psychosocial circumstances 04/04/2022   General medical examination for administrative purposes 04/04/2022   Stage 3b chronic kidney disease ([place])    Dysphagia, post-stroke    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 hemorrhage of brain ([place]) 02/26/2022   Cough 54/00/8676   Acute metabolic encephalopathy 19/50/9326   Obstructive hydrocephalus ([place]) 02/19/2022   Dyslipidemia 02/19/2022   Dysphagia 02/19/2022   AKI (acute kidney injury) ([place])    Hyperkalemia    Anemia    Pleural effusion on right    Respiratory failure requiring intubation (HCC)    Cerebral edema (HCC)    Chronic atrial fibrillation ([place])    Controlled type 2 diabetes mellitus with hyperglycemia, without long-term current use of insulin (HCC)    ICH (intracerebral hemorrhage) ([place]) 02/10/2022   Intracranial hemorrhage (HCC)    Anticoagulated    Moderate tricuspid regurgitation 07/26/2021   Moderate aortic valve stenosis 03/08/2020   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[place]) 02/18/2020   Macrocytic anemia 01/13/2020   Spleen enlargement 01/13/2020   Bilateral carotid artery stenosis 07/14/2018   Atrial fibrillation, chronic ([place]) 07/14/2018   Type 2 diabetes with nephropathy ([place]) 02/14/2016   Essential hypertension 08/16/2014   Hyperlipemia, mixed 08/16/2014   Renal insufficiency 08/16/2014    ONSET  DATE: 02/10/2022  REFERRING DIAG: I62.9 (ICD-10-CM) - Nontraumatic intracranial hemorrhage, unspecified  THERAPY DIAG:  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  Pt accompanied by:  self  PERTINENT HISTORY: cerebellar hemorrhage w/ shift and early hydrocephalus; emergent decompression surgery w/ Dr. Ellene Route on 02/11/22 w/ EVD pulled 02/14/22. PMH includes DMT2, HTN, HLD, atrial fibrillation on Eliquis, arthritis, anemia, and GERD  PAIN: Are you having pain? No  PATIENT GOALS: Be independent again; play guitar  OBJECTIVE:   TODAY'S TREATMENT:  Engaged in check writing task to address attention to detail, problem solving, and awareness.  Pt writing 3 sample checks with good organization and attention to detail.  On one check pt writing "Screwdriver" where on the prompt it stated paint.  Pt demonstrating improved legibility on larger lines provided with sample tasks.  Pt reports that the lines are much smaller on his check book register, and he has begun writing across 2 lines to increase legibility - to allow for recognition of errors and to allow wife to check behind pt.  Pt initially with errors when attempting to perform mental math, however much improved with use of calculator.  Pt did make one error with calculator but was able to correct without any cues and then performed remainder with no additional errors.   Box and blocks: 35 with LUE.  Attempted a second time with wrist weight, however pt demonstrating increased difficulty and slowed movements.   Engaged in block stacking with pt able to complete up to stack of 3 but unable to advance higher due to inability to stabilize through forearm or hand.  Therapist reiterated recommendation for closed chain movements when possible and stabilizing at torso or on surface to decrease ataxia.   PATIENT EDUCATION: Ongoing condition-specific education, particularly regarding neuro reed and typical recovery patterns as well as benefit and goal of weight bearing/increased somatosensory input through affected UE Person educated: Patient Education method: Explanation Education comprehension: verbalized understanding   HOME EXERCISE  PROGRAM: Coordination handout - see pt instructions  Access Code: [email or web address] URL: [email or web address] Date: 06/04/2022 Prepared by: [facility]  Exercises - Seated Shoulder Flexion with [place] to 90  - 4 x weekly - 2-3 sets - 10 reps - Seated Chest Press with Bar  - 4 x weekly - 2-3 sets - 10 reps - Single Arm Shoulder Flexion with Dumbbell  - 4 x weekly - 2-3 sets - 10 reps - Seated Shoulder External Rotation with Dumbbell  - 4 x weekly - 2-3 sets - 10 reps   GOALS: Goals reviewed with patient? Yes  SHORT TERM GOALS: Target date: 06/08/22  STG  Status:  1 Pt will demonstrate independence w/ initial HEP designed for LUE GM and [place] Baseline: No HEP at this time Progressing  2 Pt will improve participation in functional bilateral FM tasks as evidenced by decreasing time to complete 9-Hole Peg Test w/ LUE by at least 6 sec Baseline: Right: 21.8  sec; Left: 51.2 sec Progressing - 06/04/22 51.66 sec 06/14/22 - 1:12.03 sec 06/26/11 - 56.32 sec  3 Pt will be able to complete simulated bilateral functional task (e.g. cutting food, manipulating clothing fasteners, washing dishes) within reasonable amount of time Baseline: Difficulty w/ bilateral tasks Met - pt reports "I  have to be deliberate, but I can get it done"     LONG TERM GOALS: Target date: 07/06/22  LTG  Status:  1 Pt will improve participation in functional activities as evidenced by increasing FOTO score to 71 by d/c Baseline: 62 Progressing  2 Pt will demonstrate improved control and accuracy w/ LUE by completing 8 taps within 10 seconds during finger to nose test Baseline: Right: 12 taps in 10 sec; Left: 6 taps in 10 sec Progressing - 8 taps in 10 seconds (while stabilizing elbow at side)  3 Pt will improve Box and Blocks score to at least 40 blocks by d/c to indicate improved unilateral GMC of LUE Baseline: Right 61 blocks, Left 29 blocks Progressing - 06/28/22 - 35 blocks  4 Pt  will safely demonstrate simulated grilling activity, incorporating compensatory strategies/AE prn, w/ Mod I by d/c Baseline: Unable to complete at this time Progressing     ASSESSMENT:  CLINICAL IMPRESSION: Treatment session with focus on closed chain movements when possible during more functional tasks.  Pt demonstrates decreased impact of ataxia when able to increase proprioceptive input through UE by either closed chain on surface or stabilizing elbow along torso.  Pt continues to benefit from intermittent cues for pacing with functional reach and FMC/GMC tasks with LUE.  Pt continues to demonstrate ataxia with both gross and fine motor tasks, benefiting from verbal cues for pacing, as decreased speed facilitates increased accuracy and success. Pt's spouse reporting that he "is not accurate with his checkbook".  She's not sure if it's legibility or if it may be his alternating attention and error awareness. Pt demonstrating good awareness of errors when utilizing calculator for math and larger spaced lines to improve legibility.  PERFORMANCE DEFICITS in functional skills including ADLs, IADLs, coordination, dexterity, sensation, strength, FMC, GMC, mobility, balance, body mechanics, and UE functional use.  IMPAIRMENTS are limiting patient from ADLs, IADLs, and leisure.   COMORBIDITIES has co-morbidities such as atrial fibrillation on Eliquis and DMT2  that affects occupational performance. Patient will benefit from skilled OT to address above impairments and improve overall function.  MODIFICATION OR ASSISTANCE TO COMPLETE EVALUATION: Min-Moderate modification of tasks or assist with assess necessary to complete an evaluation.  OT OCCUPATIONAL PROFILE AND HISTORY: Detailed assessment: Review of records and additional review of physical, cognitive, psychosocial history related to current functional performance.  CLINICAL DECISION MAKING: Moderate - several treatment options, min-mod task  modification necessary  REHAB POTENTIAL: Good  EVALUATION COMPLEXITY: Moderate   PLAN: OT FREQUENCY: 2x/week  OT DURATION: 8 weeks  PLANNED INTERVENTIONS: self care/ADL training, therapeutic exercise, therapeutic activity, neuromuscular re-education, manual therapy, functional mobility training, aquatic therapy, electrical stimulation, ultrasound, biofeedback, moist heat, cryotherapy, patient/family education, energy conservation, and DME and/or AE instructions  RECOMMENDED OTHER SERVICES: To receive PT/ST services at this location  CONSULTED AND AGREED WITH PLAN OF CARE: Patient and family member/caregiver  PLAN FOR NEXT SESSION:   GMC/FMC activities for LUE. Seated vs standing weight-bearing, closed-chain exercise, coordination activities, etc.  Functional in nature, if possible, to carry over to pt desire to play guitar again.    Simonne Come, OTR/L 06/28/2022, 3:41 PM

## 2022-06-29 NOTE — Therapy (Addendum)
OUTPATIENT PHYSICAL THERAPY PROGRESS NOTE  Patient Name: Vernon [name] MRN: [id number] DOB:04-Dec-1945, 76 y.o., male Today's Date: 07/02/2022  PCP: Baxter Hire, MD  REFERRING PROVIDER: Baxter Hire, MD    Progress Note Reporting Period 06/11/22 to 07/03/22  See note below for Objective Data and Assessment of Progress/Goals.      END OF SESSION:   PT End of Session - 07/02/22 1706     Visit Number 15    Number of Visits 27    Date for PT Re-Evaluation 08/13/22    Authorization Type [employer] Medicare    Progress Note Due on Visit 19    PT Start Time 1617    PT Stop Time 1702    PT Time Calculation (min) 45 min    Equipment Utilized During Treatment Gait belt    Activity Tolerance Patient tolerated treatment well    Behavior During Therapy WFL for tasks assessed/performed;Impulsive                 Past Medical History:  Diagnosis Date   Anemia    Aortic stenosis    Arthritis    Complication of anesthesia    hard time getting bp up after knee replacement   Diabetes mellitus without complication (HCC)    GERD (gastroesophageal reflux disease)    occ tums prn   History of hiatal hernia    Hypertension    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[place])    Past Surgical History:  Procedure Laterality Date   CARPAL TUNNEL RELEASE  2012   CRANIOTOMY N/A 02/10/2022   Procedure: SUBOCCIPITAL CRANIECTOMY FOR EVACUATION OF CEREBELLAR HEMATOMA;  Surgeon: Kristeen Miss, MD;  Location: [facility];  Service: Neurosurgery;  Laterality: N/A;   JOINT REPLACEMENT Right 2010   SHOULDER ARTHROSCOPY WITH ROTATOR CUFF REPAIR AND OPEN BICEPS TENODESIS Right 11/09/2019   Procedure: RIGHT SHOULDER ARTHROSCOPY WITH SUBSCAPULARIS REPAIR, SUBACROMIAL DECOMPRESSION,MINI OPEN ROTATOR CUFF REPAIR;  Surgeon: Leim Fabry, MD;  Location: [facility];  Service: Orthopedics;  Laterality: Right;   Patient Active Problem List   Diagnosis Date Noted   Proteinuria, unspecified 05/23/2022   Simple renal cyst  05/23/2022   Long term current use of anticoagulant 04/04/2022   Rotator cuff syndrome 04/04/2022   Exposure to potentially hazardous substance 04/04/2022   Gout 04/04/2022   Osteoarthritis 04/04/2022   Osteoporosis 04/04/2022   Other specified diseases of hair and hair follicles 82/95/6213   Pain in joint, lower leg 04/04/2022   Problem related to unspecified psychosocial circumstances 04/04/2022   General medical examination for administrative purposes 04/04/2022   Stage 3b chronic kidney disease ([place])    Dysphagia, post-stroke    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 hemorrhage of brain ([place]) 02/26/2022   Cough 08/65/7846   Acute metabolic encephalopathy 96/29/5284   Obstructive hydrocephalus ([place]) 02/19/2022   Dyslipidemia 02/19/2022   Dysphagia 02/19/2022   AKI (acute kidney injury) ([place])    Hyperkalemia    Anemia    Pleural effusion on right    Respiratory failure requiring intubation (HCC)    Cerebral edema (HCC)    Chronic atrial fibrillation ([place])    Controlled type 2 diabetes mellitus with hyperglycemia, without long-term current use of insulin (HCC)    ICH (intracerebral hemorrhage) ([place]) 02/10/2022   Intracranial hemorrhage (HCC)    Anticoagulated    Moderate tricuspid regurgitation 07/26/2021   Moderate aortic valve stenosis 03/08/2020   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[place]) 02/18/2020   Macrocytic anemia 01/13/2020   Spleen enlargement 01/13/2020   Bilateral carotid artery stenosis  07/14/2018   Atrial fibrillation, chronic ([place]) 07/14/2018   Type 2 diabetes with nephropathy ([place]) 02/14/2016   Essential hypertension 08/16/2014   Hyperlipemia, mixed 08/16/2014   Renal insufficiency 08/16/2014    REFERRING DIAG: I62.9 (ICD-10-CM) - Nontraumatic intracranial hemorrhage, unspecified   THERAPY DIAG:  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 Feels like he needs to continue with PT d/t not having the balance to walk without a device.   PAIN:  Are you having pain? Yes; L knee and R shoulder "aching" 4/10  OBJECTIVE:     TODAY'S TREATMENT: 07/02/22 Activity Comments  FOTO [phone]  Stair navigation  Alt reciprocal pattern with CGA and B handrail support   Curb navigation with RW 4x Cues to decrease speed and lock walker before moving     M-CTSIB  Condition 1: Firm Surface, EO 30 Sec, Mild and Moderate Sway  Condition 2: Firm Surface, EC 30 Sec, Moderate Sway * required RW support to get into romberg  Condition 3: Foam Surface, EO 30 Sec, Severe Sway * required RW support to get into romberg  Condition 4: Foam Surface, EC NT Sec,  NT  Sway      [facility] PT Assessment - 07/02/22 0001       Berg Balance Test   Sit to Stand Able to stand without using hands and stabilize independently    Standing Unsupported Able to stand safely 2 minutes    Sitting with Back Unsupported but Feet Supported on Floor or Stool Able to sit safely and securely 2 minutes    Stand to Sit Sits safely with minimal use of hands    Transfers Able to transfer safely, minor use of hands    Standing Unsupported with Eyes Closed Able to stand 10 seconds with supervision    Standing Unsupported with Feet Together Able to place feet together independently and stand for 1 minute with supervision    From Standing, Reach Forward with Outstretched Arm Can reach forward >12 cm safely (5")    From Standing Position, Pick up Object from [place] to pick up shoe safely and easily    From Standing Position, Turn to Look Behind Over each Shoulder Needs supervision when turning    Turn 360 Degrees Able to turn 360 degrees safely but slowly     Standing Unsupported, Alternately Place Feet on Step/Stool Able to complete >2 steps/needs minimal assist   completed 8 reps with LOB and min A for safety   Standing Unsupported, One Foot in Front Loses balance while stepping or standing    Standing on One Leg Unable to try or needs assist to prevent fall    Total Score 37               HOME EXERCISE PROGRAM Last updated: 07/03/22 Access Code: [email or web address] URL: [email or web address] Date: 07/02/2022 Prepared by: [facility]  Exercises - Seated Hip Abduction  - 1 x daily - 5 x weekly - 2 sets - 10 reps - Seated Long Arc Quad  - 1 x daily - 5 x weekly - 2 sets - 10 reps - Sit to Stand with Counter Support  - 1 x daily -  5 x weekly - 2 sets - 10 reps - Standing Hip Abduction with Counter Support  - 1 x daily - 5 x weekly - 2 sets - 10 reps - Standing Alternating Knee Flexion with Ankle Weights  - 1 x daily - 5 x weekly - 2 sets - 10 reps   PATIENT EDUCATION: Education details: edu on exam findings and recommendation for continued RW use d/t safety concerns; extension of POC; HEP update and edu on importance of continued fitness (recommended recumbent bike at home) Person educated: Patient, wife Education method: Explanation, handout  Education comprehension: verbalized understanding   -------------------------------------------------------------------------------------------------------------------------   From eval  DIAGNOSTIC FINDINGS: see MAR   COGNITION: Overall cognitive status: Within functional limits for tasks assessed             SENSATION: WFL   COORDINATION: Bradykinesia, difficulty wit rapid alternating movements     MUSCLE TONE: WFL       POSTURE: No Significant postural limitations   LOWER EXTREMITY ROM:      WFL  (Blank rows = not tested)   LOWER EXTREMITY MMT:     MMT Right Eval Left Eval  Hip flexion 5 4  Hip extension 5 3+  Hip abduction 5  3-  Hip adduction      Hip internal rotation      Hip external rotation      Knee flexion 5 4  Knee extension 5 4  Ankle dorsiflexion 5 4+  Ankle plantarflexion      Ankle inversion      Ankle eversion      (Blank rows = not tested)   BED MOBILITY:  Independent   TRANSFERS: Assistive device utilized: Environmental consultant - 2 wheeled  Sit to stand: Modified independence Stand to sit: Modified independence Chair to chair: SBA and CGA Floor:  DNT   RAMP:  Not available   CURB:  Level of Assistance: SBA and CGA Assistive device utilized: Environmental consultant - 2 wheeled Curb Comments: undershoots with LLE   STAIRS:           Level of Assistance: SBA and CGA           Stair Negotiation Technique: Alternating Pattern  with Bilateral Rails           Number of Stairs: 6             Height of Stairs: 4-6"            Comments: dysmetria w/ LLE   GAIT: Gait pattern: circumduction- Left and ataxic Distance walked: 100 Assistive device utilized: Walker - 2 wheeled Level of assistance: SBA Comments: dysmetria LLE   FUNCTIONAL TESTs:  Timed up and go (TUG): 20.25 with RW Berg Balance Scale: 35/56      M-CTSIB  Condition 1: Firm Surface, EO 30 Sec, Mild Sway  Condition 2: Firm Surface, EC 30 Sec, Mild and Moderate Sway  Condition 3: Foam Surface, EO 9 Sec, Severe Sway  Condition 4: Foam Surface, EC 0 Sec,  unable  Sway    PATIENT SURVEYS:  FOTO 54.43% functional status   TODAY'S TREATMENT:  Evaluation, sidestepping along counter, tandem stance at counter     PATIENT EDUCATION: Education details: scope of PT intervention Person educated: Patient and Spouse Education method: Explanation Education comprehension: verbalized understanding     HOME EXERCISE PROGRAM: sidestepping along counter, tandem stance at counter   -----------------------------------------------------------------------------------------------------------------------    GOALS: Goals reviewed with patient? Yes   SHORT  TERM GOALS: Target  date: 05/29/2022   Patient will perform HEP with family/caregiver supervision for improved strength, balance, transfers, and gait  Baseline: Goal status: MET   2.  Patient will achieve 15 seconds for TUG test to manifest reduced risk for falls Baseline: 20.25 with RW; 14 sec with RW Goal status: MET   3. Demo modified independent gait level surfaces and curb negotiation            Baseline: CGA-SBA with RW; unchanged 07/02/22            Goal status: On-going   LONG TERM GOALS: Target date: 08/13/2022   Patient will demonstrate score 45/56 Berg Balance Test to manifest low risk for falls Baseline: 35/56; 37/56 on 06/06/22; 37/56 07/02/22 Goal status: On-going   2.  Demonstrate improved static balance and postural control per time of 15 sec condition 4 M-CTSIB to improve safety with mobility on uneven surfaces Baseline: complete LOB left with condition 3; NT d/t safety 07/02/22 Goal status: On-going   3.  Demo modified independent gait using least restrictive AD over various surfaces and ascend/descend stairs with set-up assist to improve functional mobility Baseline: able to navigate stairs with B handrail with CGA 07/02/22  Goal status: on-going   4.  Demo score of 57% functional status FOTO  Baseline: 54.43%; 57% 07/02/22  Goal status: MET       ASSESSMENT:   CLINICAL IMPRESSION: Patient arrived to session without new complaints. Notes remaining trouble with balance, particularly losing balance to the L and feels that he is unable to walk without a device. Patient was able to navigate stairs today with B UE support with CGA for safety. Static balance on foam revealed severe sway; unable to test with EC d/t safety. Patient scored 37/56 on Berg, indicating an increased risk of falls and unchanged from initial assessment on 06/06/22. Patient with frequent LOB requiring min-mod A to maintain stability. Appears to have delayed reaction to LOB, which always occurs to  the L. Patient was able to perform curb navigation with CGA/SBA for safety with fairly good stability. Requires edu on safety with transfers d/t tendency for posterior LOB with STS transfers. Educated patient and wife on HEP and importance of aerobic activity such as using recumbent bike at home for fitness. Both reported understanding. Patient is demonstrating slow but steady progress towards goals. Would benefit from additional skilled PT services 2x/week for 6 weeks to address imbalance and safety concerns.    OBJECTIVE IMPAIRMENTS Abnormal gait, decreased activity tolerance, decreased balance, decreased coordination, decreased endurance, decreased knowledge of use of DME, difficulty walking, decreased strength, impaired perceived functional ability, and improper body mechanics.    ACTIVITY LIMITATIONS carrying, lifting, bending, standing, squatting, stairs, transfers, and locomotion level   PARTICIPATION LIMITATIONS: meal prep, cleaning, laundry, driving, shopping, community activity, and yard work   PERSONAL FACTORS Age, Fitness, and Time since onset of injury/illness/exacerbation are also affecting patient's functional outcome.    REHAB POTENTIAL: Excellent   CLINICAL DECISION MAKING: Evolving/moderate complexity   EVALUATION COMPLEXITY: Moderate   PLAN: PT FREQUENCY: 2x/week   PT DURATION: 6 weeks   PLANNED INTERVENTIONS: Therapeutic exercises, Therapeutic activity, Neuromuscular re-education, Balance training, Gait training, Patient/Family education, Joint mobilization, Stair training, Vestibular training, Canalith repositioning, DME instructions, Aquatic Therapy, Electrical stimulation, Wheelchair mobility training, and Manual therapy   PLAN FOR NEXT SESSION:  balance reactions and balance on uneven surfaces; training with gait in/out of clinic to negotiate curbs and sidewalk and ability to enter clinic unassisted by staff.  Stair ambulation.                    Janene Harvey,  PT, DPT 07/02/22 5:20 PM  [facility] [address] [place], [place], [place] [address] Phone # [phone] Fax # [phone]

## 2022-07-02 ENCOUNTER — Ambulatory Visit: Payer: Medicare Other | Admitting: Occupational Therapy

## 2022-07-02 ENCOUNTER — Ambulatory Visit: Payer: Medicare Other

## 2022-07-02 ENCOUNTER — Ambulatory Visit: Payer: Medicare Other | Admitting: Physical Therapy

## 2022-07-02 ENCOUNTER — Encounter: Payer: Self-pay | Admitting: Physical Therapy

## 2022-07-02 DIAGNOSIS — M6281 Muscle weakness (generalized): Secondary | ICD-10-CM

## 2022-07-02 DIAGNOSIS — R262 Difficulty in walking, not elsewhere classified: Secondary | ICD-10-CM

## 2022-07-02 DIAGNOSIS — D461 Refractory anemia with ring sideroblasts: Secondary | ICD-10-CM | POA: Diagnosis not present

## 2022-07-02 DIAGNOSIS — R2681 Unsteadiness on feet: Secondary | ICD-10-CM

## 2022-07-02 DIAGNOSIS — R2689 Other abnormalities of gait and mobility: Secondary | ICD-10-CM

## 2022-07-02 DIAGNOSIS — R278 Other lack of coordination: Secondary | ICD-10-CM

## 2022-07-02 DIAGNOSIS — I69254 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: Secondary | ICD-10-CM

## 2022-07-02 DIAGNOSIS — R41841 Cognitive communication deficit: Secondary | ICD-10-CM

## 2022-07-02 DIAGNOSIS — R471 Dysarthria and anarthria: Secondary | ICD-10-CM

## 2022-07-02 NOTE — Patient Instructions (Signed)
    CONTINUE DOING THE VOICE EXERCISES!  I think making an appointment with an ENT MD would be a good idea at this time. I expect if you called today it would be 2-3 weeks before you could get in.  If the voice improves considerably between now and the appointment date, you can cancel the appointment.   Suggestions in [place]: Dr. Kathyrn Sheriff or Dr Pryor Ochoa   -both are with  [facility] [place] [address] [place], [place] [address] Ph: [phone]

## 2022-07-02 NOTE — Therapy (Signed)
OUTPATIENT OCCUPATIONAL THERAPY Treatment Note   Patient Name: Vernon [name] MRN: [id number] DOB:Mar 22, 1946, 76 y.o., male Today's Date: 07/02/2022  PCP: Baxter Hire, MD REFERRING PROVIDER: Baxter Hire, MD      OT End of Session - 07/02/22 1536     Visit Number 14    Number of Visits 17    Date for OT Re-Evaluation 07/06/22    Authorization Type UHC Medicare    Authorization Time Period VL: [place]    OT Start Time 1534    OT Stop Time 1615    OT Time Calculation (min) 41 min    Activity Tolerance Patient tolerated treatment well    Behavior During Therapy WFL for tasks assessed/performed                  Past Medical History:  Diagnosis Date   Anemia    Aortic stenosis    Arthritis    Complication of anesthesia    hard time getting bp up after knee replacement   Diabetes mellitus without complication (HCC)    GERD (gastroesophageal reflux disease)    occ tums prn   History of hiatal hernia    Hypertension    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[place])    Past Surgical History:  Procedure Laterality Date   CARPAL TUNNEL RELEASE  2012   CRANIOTOMY N/A 02/10/2022   Procedure: SUBOCCIPITAL CRANIECTOMY FOR EVACUATION OF CEREBELLAR HEMATOMA;  Surgeon: Kristeen Miss, MD;  Location: [facility];  Service: Neurosurgery;  Laterality: N/A;   JOINT REPLACEMENT Right 2010   SHOULDER ARTHROSCOPY WITH ROTATOR CUFF REPAIR AND OPEN BICEPS TENODESIS Right 11/09/2019   Procedure: RIGHT SHOULDER ARTHROSCOPY WITH SUBSCAPULARIS REPAIR, SUBACROMIAL DECOMPRESSION,MINI OPEN ROTATOR CUFF REPAIR;  Surgeon: Leim Fabry, MD;  Location: [facility];  Service: Orthopedics;  Laterality: Right;   Patient Active Problem List   Diagnosis Date Noted   Proteinuria, unspecified 05/23/2022   Simple renal cyst 05/23/2022   Long term current use of anticoagulant 04/04/2022   Rotator cuff syndrome 04/04/2022   Exposure to potentially hazardous substance 04/04/2022   Gout 04/04/2022   Osteoarthritis  04/04/2022   Osteoporosis 04/04/2022   Other specified diseases of hair and hair follicles 32/95/1884   Pain in joint, lower leg 04/04/2022   Problem related to unspecified psychosocial circumstances 04/04/2022   General medical examination for administrative purposes 04/04/2022   Stage 3b chronic kidney disease ([place])    Dysphagia, post-stroke    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 hemorrhage of brain ([place]) 02/26/2022   Cough 16/60/6301   Acute metabolic encephalopathy 60/08/9322   Obstructive hydrocephalus ([place]) 02/19/2022   Dyslipidemia 02/19/2022   Dysphagia 02/19/2022   AKI (acute kidney injury) ([place])    Hyperkalemia    Anemia    Pleural effusion on right    Respiratory failure requiring intubation (HCC)    Cerebral edema (HCC)    Chronic atrial fibrillation ([place])    Controlled type 2 diabetes mellitus with hyperglycemia, without long-term current use of insulin (HCC)    ICH (intracerebral hemorrhage) ([place]) 02/10/2022   Intracranial hemorrhage (HCC)    Anticoagulated    Moderate tricuspid regurgitation 07/26/2021   Moderate aortic valve stenosis 03/08/2020   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[place]) 02/18/2020   Macrocytic anemia 01/13/2020   Spleen enlargement 01/13/2020   Bilateral carotid artery stenosis 07/14/2018   Atrial fibrillation, chronic ([place]) 07/14/2018   Type 2 diabetes with nephropathy ([place]) 02/14/2016   Essential hypertension 08/16/2014   Hyperlipemia, mixed 08/16/2014   Renal insufficiency 08/16/2014  ONSET DATE: 02/10/2022  REFERRING DIAG: I62.9 (ICD-10-CM) - Nontraumatic intracranial hemorrhage, unspecified  THERAPY DIAG:  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 Ellene Route on 02/11/22 w/ EVD pulled 02/14/22. PMH includes DMT2, HTN, HLD, atrial fibrillation on Eliquis, arthritis, anemia, and GERD  PAIN: Are you having pain? No  PATIENT GOALS: Be independent again; play guitar  OBJECTIVE:   TODAY'S TREATMENT:  Engaged in dynamic sitting balance and focus on motor control with LUE during reaching activity with placing and removing rings from cones.  Pt demonstrating mild ataxia during reaching to place rings on cone and min-mod when removing.  Pt dropping and knocking over cones 15% of time.  Engaged in reaching outside [place] and across midline to challenge functional reach. Engaged in dynamic standing balance with clothespins. Engaged in reaching in various planes to incorporate across midline, outside BOS, and up and down to challenge balance and functional reach.  Utilizing resistive clothespins (4# and 6#) to place on vertical dowel.  Pt demonstrating increased difficulty when reaching across midline with LUE to place on vertical dowel, compared to down on R or L of body.  CGA for standing balance and cues to not rely on mat table surface along backs of legs for standing balance.   PATIENT EDUCATION: Ongoing condition-specific education, particularly regarding neuro reed and typical recovery patterns as well as benefit and goal of weight bearing/increased somatosensory input through affected UE Person educated: Patient Education method: Explanation Education comprehension: verbalized understanding   HOME EXERCISE PROGRAM: Coordination handout - see pt instructions  Access Code: [email or web address] URL: [email or web address] Date: 06/04/2022 Prepared by: [facility]  Exercises - Seated Shoulder Flexion with Dowel to 90  - 4 x weekly - 2-3 sets - 10 reps - Seated  Chest Press with Bar  - 4 x weekly - 2-3 sets - 10 reps - Single Arm Shoulder Flexion with Dumbbell  - 4 x weekly - 2-3 sets - 10 reps - Seated Shoulder External Rotation with Dumbbell  - 4 x weekly - 2-3 sets - 10 reps   GOALS: Goals reviewed with patient? Yes  SHORT TERM GOALS: Target date: 06/08/22  STG  Status:  1 Pt will demonstrate independence w/ initial HEP designed for LUE GM and [place] Baseline: No HEP at this time Progressing  2 Pt will improve participation in functional bilateral FM tasks as evidenced by decreasing time to complete 9-Hole Peg Test w/ LUE by at least 6 sec Baseline: Right: 21.8  sec; Left: 51.2 sec Progressing - 06/04/22 51.66 sec 06/14/22 - 1:12.03 sec 06/26/11 - 56.32 sec  3 Pt will be able to complete simulated bilateral functional task (e.g. cutting food, manipulating clothing fasteners, washing dishes) within reasonable amount of time Baseline: Difficulty w/ bilateral tasks Met - pt reports "I have to be deliberate, but I can get it done"     LONG TERM GOALS: Target date: 07/06/22  LTG  Status:  1 Pt will improve participation in functional activities as evidenced by increasing FOTO score to 71 by d/c Baseline: 7 Progressing  2 Pt will demonstrate improved control and accuracy  w/ LUE by completing 8 taps within 10 seconds during finger to nose test Baseline: Right: 12 taps in 10 sec; Left: 6 taps in 10 sec Progressing - 8 taps in 10 seconds (while stabilizing elbow at side)  3 Pt will improve Box and Blocks score to at least 40 blocks by d/c to indicate improved unilateral GMC of LUE Baseline: Right 61 blocks, Left 29 blocks Progressing - 06/28/22 - 35 blocks  4 Pt will safely demonstrate simulated grilling activity, incorporating compensatory strategies/AE prn, w/ Mod I by d/c Baseline: Unable to complete at this time Progressing     ASSESSMENT:  CLINICAL IMPRESSION: Treatment session with focus on closed and open chain movements during functional  reaching tasks.  Pt demonstrates decreased impact of ataxia when able to increase proprioceptive input through UE by either closed chain on surface or stabilizing elbow along torso.  Pt continues to demonstrate ataxia with both gross and fine motor tasks, benefiting from verbal cues for pacing, as decreased speed facilitates increased accuracy and success. Pt will continue to benefit from WB and closed chain activities to incorporate into functional tasks to decrease effects of ataxia on functional reach.  PERFORMANCE DEFICITS in functional skills including ADLs, IADLs, coordination, dexterity, sensation, strength, FMC, GMC, mobility, balance, body mechanics, and UE functional use.  IMPAIRMENTS are limiting patient from ADLs, IADLs, and leisure.   COMORBIDITIES has co-morbidities such as atrial fibrillation on Eliquis and DMT2  that affects occupational performance. Patient will benefit from skilled OT to address above impairments and improve overall function.  MODIFICATION OR ASSISTANCE TO COMPLETE EVALUATION: Min-Moderate modification of tasks or assist with assess necessary to complete an evaluation.  OT OCCUPATIONAL PROFILE AND HISTORY: Detailed assessment: Review of records and additional review of physical, cognitive, psychosocial history related to current functional performance.  CLINICAL DECISION MAKING: Moderate - several treatment options, min-mod task modification necessary  REHAB POTENTIAL: Good  EVALUATION COMPLEXITY: Moderate   PLAN: OT FREQUENCY: 2x/week  OT DURATION: 8 weeks  PLANNED INTERVENTIONS: self care/ADL training, therapeutic exercise, therapeutic activity, neuromuscular re-education, manual therapy, functional mobility training, aquatic therapy, electrical stimulation, ultrasound, biofeedback, moist heat, cryotherapy, patient/family education, energy conservation, and DME and/or AE instructions  RECOMMENDED OTHER SERVICES: To receive PT/ST services at this  location  CONSULTED AND AGREED WITH PLAN OF CARE: Patient and family member/caregiver  PLAN FOR NEXT SESSION:   GMC/FMC activities for LUE. Seated vs standing weight-bearing, closed-chain exercise, coordination activities, etc.  Functional in nature, if possible, to carry over to pt desire to play guitar again. Assess goals and re-cert if recommended.   Simonne Come, OTR/L 07/02/2022, 3:37 PM

## 2022-07-02 NOTE — Therapy (Signed)
OUTPATIENT SPEECH LANGUAGE PATHOLOGY TREATMENT NOTE   Patient Name: Vernon [name] MRN: [id number] DOB:01/13/1946, 76 y.o., male Today's Date: 07/03/2022  PCP: Vernon Hire, MD  REFERRING PROVIDER: Baxter Hire, MD   END OF SESSION:   End of Session - 07/03/22 1207     Visit Number 13    Number of Visits 25    Date for SLP Re-Evaluation 07/26/22    Authorization Type UHC    SLP Start Time 1450    SLP Stop Time  1530    SLP Time Calculation (min) 40 min    Activity Tolerance Patient tolerated treatment well                  Past Medical History:  Diagnosis Date   Anemia    Aortic stenosis    Arthritis    Complication of anesthesia    hard time getting bp up after knee replacement   Diabetes mellitus without complication (HCC)    GERD (gastroesophageal reflux disease)    occ tums prn   History of hiatal hernia    Hypertension    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[place])    Past Surgical History:  Procedure Laterality Date   CARPAL TUNNEL RELEASE  2012   CRANIOTOMY N/A 02/10/2022   Procedure: SUBOCCIPITAL CRANIECTOMY FOR EVACUATION OF CEREBELLAR HEMATOMA;  Surgeon: Vernon Miss, MD;  Location: [facility];  Service: Neurosurgery;  Laterality: N/A;   JOINT REPLACEMENT Right 2010   SHOULDER ARTHROSCOPY WITH ROTATOR CUFF REPAIR AND OPEN BICEPS TENODESIS Right 11/09/2019   Procedure: RIGHT SHOULDER ARTHROSCOPY WITH SUBSCAPULARIS REPAIR, SUBACROMIAL DECOMPRESSION,MINI OPEN ROTATOR CUFF REPAIR;  Surgeon: Vernon Fabry, MD;  Location: [facility];  Service: Orthopedics;  Laterality: Right;   Patient Active Problem List   Diagnosis Date Noted   Proteinuria, unspecified 05/23/2022   Simple renal cyst 05/23/2022   Long term current use of anticoagulant 04/04/2022   Rotator cuff syndrome 04/04/2022   Exposure to potentially hazardous substance 04/04/2022   Gout 04/04/2022   Osteoarthritis 04/04/2022   Osteoporosis 04/04/2022   Other specified diseases of hair and hair  follicles 56/21/3086   Pain in joint, lower leg 04/04/2022   Problem related to unspecified psychosocial circumstances 04/04/2022   General medical examination for administrative purposes 04/04/2022   Stage 3b chronic kidney disease ([place])    Dysphagia, post-stroke    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 hemorrhage of brain ([place]) 02/26/2022   Cough 57/84/6962   Acute metabolic encephalopathy 95/28/4132   Obstructive hydrocephalus ([place]) 02/19/2022   Dyslipidemia 02/19/2022   Dysphagia 02/19/2022   AKI (acute kidney injury) ([place])    Hyperkalemia    Anemia    Pleural effusion on right    Respiratory failure requiring intubation (HCC)    Cerebral edema (HCC)    Chronic atrial fibrillation ([place])    Controlled type 2 diabetes mellitus with hyperglycemia, without long-term current use of insulin (HCC)    ICH (intracerebral hemorrhage) ([place]) 02/10/2022   Intracranial hemorrhage (HCC)    Anticoagulated    Moderate tricuspid regurgitation 07/26/2021   Moderate aortic valve stenosis 03/08/2020   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  Notes his LUE and LLE are most affected at present. Underwent acute hospitalization, and [facility]. Recently D/C from home health on 04/30/22. Did not receive HHST, only HHPT/OT.     ONSET DATE: 02/10/22   REFERRING DIAG: I62.9 (ICD-10-CM) - Nontraumatic intracranial hemorrhage, unspecified   THERAPY DIAG:  No diagnosis found.  Rationale for Evaluation and Treatment Rehabilitation  SUBJECTIVE: "[name] had some things to do at the  shop."   PAIN:  Are you having pain? No   OBJECTIVE:   TODAY'S TREATMENT: 07/02/22: Vernon [name] entered tx room and correctly told SLP appointments today and next therapy day (Wednesday). He told SLP challenge with checkbook is with legibility due to spaces being too thin so he is using two spaces. SLP agreed with pt with this. He performed PHOrTE HEP with SBA with pitch variability with numbers. SLP told pt SLP is recommending ENT consult due to voice still not WNL - but it has improved since initial eval. SLP got two names of ENTs in [place] area as pt is from [employer]. SLP provided these names for pt and asked pt to show wife, which SLP learned he did after his last tx session today, when wife arrived.   06/28/22: Pt was late getting into ST today due to aide not comfortable transferring pt/walking pt in to clinic. SLP provided occasional min- mod A faded to rare min A for abdominal breathing with PHOrTE tasks, but other procedural steps were WNL. With calendar tasks, pt tracked appointments WNL, but had mistake that he was unaware of re: end time.  06/25/22: SLP reminded pt that he had difficulty last session with ID that day's therapy sessions, when he returned, etc. SLP asked pt to tell the sessions today which he did correctly. When SLP asked pt to tell the sequence of next appointments, he was incorrect and req'd SLP A for correction. This occurred due to pt taking out the *entire page* of a horizontal bifold calendar to look at what appointments were for next month- thus pt was looking at [facility] months and req'd correction for error awareness.  Pt tells SLP he maintains BID HEP completion. Pt states he feels stronger voice now with upper and lower pitches and the middle of the range is where he feels the weakest. [name] came in to [place] and she and pt did HEP together. Pt req'd min A rarely for making sure low-hi-low numbers started at lowest pitch, slowing sentence reps, and that he was  using abdominal breathing (AB) for all productions. [name] stated pt had difficulty balancing checkbook ("for every 4 right thing sthere would be one wrong one"). SLP explained Anhad is having more difficulty with error awareness and attention to details at this time and that was another example (first being calendar task today).   06/21/22: "I have to get a CT and then they will tell me what the plan is (for cardiac sx)." Pt with WNL voicing for intermittent segments of 20-30 seconds during ST today, x9. SLP had pt perform the HEP (PHoRTE) and pt req'd rare min A; usually SLP needing to provide more intense cueing than that level - SLP attributes this to pt performing with wife correctly, and consistently. Pt req'd mod-max A tfor appointment tracking - told SLP that  Monday's appointments were today's and they were wrong on the sheet. When SLP asked pt for the date today, pt looked at SLP calendar and then pt looked elsewhere for today's date and then realized he  was telling me appointments for next week and not for today.  Pt repeated story to SLP he told earlier in session x1.    06/14/22: SLP engaged pt with his PHoRTE HEP today in presence of wife, given the fact Aladdin was not performing HEP correctly spontaneously previous session. SLP reiterated for pt/wife today how important correct completion of PHoRTE is, and that if pt's voice is not notably improved in 4 weeks ENT consult may be warranted. With occasional min A faded to rare min A for breath support, and occasional min A for "sigh" warm ups and for pitch lo-hi-lo numbers, pt completed HEP. fPrior to this, pt req'd shaping of abdominal breathing (AB) due to clavicular/thoracic breathing. Wife asking questions about PHoRTE throughout session and SLP answering these. SLP told Akeem that [name] needed to "play the role of [name]" if pt is not completing exercises correctly at home. Pt acknowledged this.  Pt stated SLP did not put his PHoRTE HEP back in his  folder after previous session but HEP was in pocket inside front cover of notebook.  06/11/22: Vernon [name] states he has been completing PHoRTE BID, however was not completing the numbers (lo-hi-lo pitch) correctly - he was saying them in a loud voice instead of varied pitch. SLP corrected pt and he was mod I by session end. Dshaun req'd occasional faded to rare min-mod SLP cues for good breath prior to each rep for numbers and sentences, and to slow productions down for quality instead of rushing through reps. Loud "ah" definitely sounds stronger than previous session with this SLP on 05-31-22. SLP also believes sigh or yawn-sigh vocalization sounds more relaxed than "waterfall" vowel. Higher pitched (calling "over the fence") rated 4/10 effort (10=most effort), and lower pitched "scolding dog" voice rated 6-7/10 effort level.  SLP worked with pt re: calendar orientation and appointment tracking. Pt req'd extra time for telling SLP date and appointment times. He req'd min cues for recall of Dr. Leonie Man appointment last week but when provided this by SLP Vernon [name] gave details that were also shown in MD note.   06/06/22: PT BROUGHT FOLDER TODAY and wanted [name] to know he remembered. Pt reports he has been completing his exercises x2/day and reports others believe he's made improvement. Completed PHoRTE exercises. Required cueing to rduce tight/strained voice, as well as to slow down for adequate breath support. Pt was able to produce "Over the fence" voice sentences with more ease, rating it a 4/10 in effort level required. Pt completed authoritative voice sentences with more difficulty, rating it a 6/10 in effort level required.   Pt felt doing a sigh vs. Waterfall was easier to complete with less tension. SLP in agreement.     PATIENT EDUCATION: Education details: See "treatment" above Person educated: Patient Education method: Explanation, demo, Verbal cues Education comprehension: verbalized understanding, returned  demonstration, verbal cues required, and needs further education      GOALS: Goals reviewed with patient? Yes   SHORT TERM GOALS: Target date: 05/31/22 Pt will complete HEP for voice/dysarthria with occasional min A over 2 sessions Baseline: 05/24/22 Goal status: Met   2.  Pt will use external aids to recall daily events/schedule with occasional min A from spouse over 1 week Baseline:  Goal status: Deferred   3.  Pt will achieve clear phonation 18/20 sentences in structured task Baseline:  Goal status: Not met   4.  Pt will ID dysnomic errors and self correct with rare min A over 2 sessions Baseline:  Goal status: Deferred     LONG TERM GOALS: Target date: 07/26/22 Pt will achieve clear phonation over 10 minute conversation with rare min A over 2 sessions Baseline:  Goal status: Revised   2.  Pt will independently recall weekly/daily schedule, appointments events and pertinent information with memory aids over 2 sessions Baseline: 07-02-22 Goal status: [place]   3.  Pt will complete vocal warm ups and follow 3 vocal hygiene strategies to return to singing for 5-10 minute periods with rare min A Baseline:  Goal status: Ongoing   4.  Improve score on Cognitive function PROM by 4 points Baseline: 101- filled out by spouse Goal status: Ongoing   ASSESSMENT:   CLINICAL IMPRESSION: Patient is a 76 y.o. male who is seen for dysarthria/voice and cognitive linguistics s/p CVA. See tx note. Today pt req'd SBA rarely for pitch variation with numbers. Pt appeared to fatigue following 4-5 reps of numbers with pitch variation. Pt did not have difficulty with appointments with calendar tasks. 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 ENT consult was suggested. I recommend cont'd skilled ST to maximize voice and cognition for safety, to reduce caregiver burden and QOL.    OBJECTIVE IMPAIRMENTS include attention, memory, awareness, dysarthria, and voice disorder. These impairments  are limiting patient from managing appointments, household responsibilities, and effectively communicating at home and in community. Factors affecting potential to achieve goals and functional outcome are ability to learn/carryover information.. Patient will benefit from skilled SLP services to address above impairments and improve overall function.   REHAB POTENTIAL: Good   PLAN: SLP FREQUENCY: 2x/week   SLP DURATION: 12 weeks   PLANNED INTERVENTIONS: Language facilitation, Environmental controls, Cueing hierachy, Cognitive reorganization, Internal/external aids, Functional tasks, Multimodal communication approach, and SLP instruction and feedback         [place], [place] 07/03/2022, 12:07 PM

## 2022-07-03 NOTE — Therapy (Signed)
OUTPATIENT PHYSICAL THERAPY TREATMENT  Patient Name: Vernon [name] MRN: [id number] DOB:06/14/1946, 76 y.o., male Today's Date: 07/04/2022  PCP: Baxter Hire, MD  REFERRING PROVIDER: Baxter Hire, MD       END OF SESSION:   PT End of Session - 07/04/22 1702     Visit Number 16    Number of Visits 27    Date for PT Re-Evaluation 08/13/22    Authorization Type UHC Medicare    Progress Note Due on Visit 19    PT Start Time 1615    PT Stop Time 1659    PT Time Calculation (min) 44 min    Equipment Utilized During Treatment Gait belt    Activity Tolerance Patient tolerated treatment well    Behavior During Therapy WFL for tasks assessed/performed;Impulsive                  Past Medical History:  Diagnosis Date   Anemia    Aortic stenosis    Arthritis    Complication of anesthesia    hard time getting bp up after knee replacement   Diabetes mellitus without complication (HCC)    GERD (gastroesophageal reflux disease)    occ tums prn   History of hiatal hernia    Hypertension    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[place])    Past Surgical History:  Procedure Laterality Date   CARPAL TUNNEL RELEASE  2012   CRANIOTOMY N/A 02/10/2022   Procedure: SUBOCCIPITAL CRANIECTOMY FOR EVACUATION OF CEREBELLAR HEMATOMA;  Surgeon: Kristeen Miss, MD;  Location: [place];  Service: Neurosurgery;  Laterality: N/A;   JOINT REPLACEMENT Right 2010   SHOULDER ARTHROSCOPY WITH ROTATOR CUFF REPAIR AND OPEN BICEPS TENODESIS Right 11/09/2019   Procedure: RIGHT SHOULDER ARTHROSCOPY WITH SUBSCAPULARIS REPAIR, SUBACROMIAL DECOMPRESSION,MINI OPEN ROTATOR CUFF REPAIR;  Surgeon: Leim Fabry, MD;  Location: [facility];  Service: Orthopedics;  Laterality: Right;   Patient Active Problem List   Diagnosis Date Noted   Proteinuria, unspecified 05/23/2022   Simple renal cyst 05/23/2022   Long term current use of anticoagulant 04/04/2022   Rotator cuff syndrome 04/04/2022   Exposure to potentially  hazardous substance 04/04/2022   Gout 04/04/2022   Osteoarthritis 04/04/2022   Osteoporosis 04/04/2022   Other specified diseases of hair and hair follicles 71/21/9758   Pain in joint, lower leg 04/04/2022   Problem related to unspecified psychosocial circumstances 04/04/2022   General medical examination for administrative purposes 04/04/2022   Stage 3b chronic kidney disease ([place])    Dysphagia, post-stroke    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 hemorrhage of brain ([place]) 02/26/2022   Cough 83/25/4982   Acute metabolic encephalopathy 64/15/8309   Obstructive hydrocephalus ([place]) 02/19/2022   Dyslipidemia 02/19/2022   Dysphagia 02/19/2022   AKI (acute kidney injury) ([place])    Hyperkalemia    Anemia    Pleural effusion on right    Respiratory failure requiring intubation (HCC)    Cerebral edema (HCC)    Chronic atrial fibrillation ([place])    Controlled type 2 diabetes mellitus with hyperglycemia, without long-term current use of insulin (HCC)    ICH (intracerebral hemorrhage) ([place]) 02/10/2022   Intracranial hemorrhage (HCC)    Anticoagulated    Moderate tricuspid regurgitation 07/26/2021   Moderate aortic valve stenosis 03/08/2020   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[place]) 02/18/2020   Macrocytic anemia 01/13/2020   Spleen enlargement 01/13/2020   Bilateral carotid artery stenosis 07/14/2018   Atrial fibrillation, chronic ([place]) 07/14/2018   Type 2 diabetes with nephropathy ([place]) 02/14/2016   Essential  hypertension 08/16/2014   Hyperlipemia, mixed 08/16/2014   Renal insufficiency 08/16/2014    REFERRING DIAG: I62.9 (ICD-10-CM) - Nontraumatic intracranial hemorrhage, unspecified   THERAPY DIAG:  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 Yes; B knee "aching" 4/10  OBJECTIVE:     TODAY'S TREATMENT: 07/04/22 Activity Comments  Nustep L5 x 3 min, L3 x 3 min Cues to maintain ~ 65 SPM  stair navigation with 1 HR x7 Alternating reciprocal pattern with 1 handrail; tendency to lean L but improved with cueing  ant/pos wt shifts feet apart Trunk ataxia/sway but pretty good ability to maintain balance despite this;   ant/pos wt shifts with feet staggered Initially with consistent LOB to L with poor self-correction; much improved with use of verbal and mirror feedback, telling patient to watch his shoulders and hips   standing wide on foam EO/EC 30" CGA d/t sway but no LOB  standing on ramp EO/EC 30" CGA; cues for "belly button forward" to address posterior LOB  fwd/back stepping 10x each LE CGA and 1 UE support; mirror feedback and cues to shift R  lateral stepping over hurdle With B UE support; cueing to shift fully onto standing LE before stepping other foot over   backwards walking In II bars with CGA and cueing to maintain widened BOS       HOME EXERCISE PROGRAM Last updated: 07/03/22 Access Code: [email or web address] URL: [email or web address] Date: 07/02/2022 Prepared by: [facility]  Exercises - Seated Hip Abduction  - 1 x daily - 5 x weekly - 2 sets - 10 reps - Seated Long Arc Quad  - 1 x daily - 5 x weekly - 2 sets - 10 reps - Sit to Stand with Counter Support  - 1 x daily - 5 x weekly - 2 sets - 10 reps - Standing Hip Abduction with Counter Support  - 1 x daily - 5 x weekly - 2 sets - 10 reps - Standing Alternating Knee Flexion with Ankle Weights  - 1 x daily - 5 x weekly - 2 sets - 10 reps   -------------------------------------------------------------------------------------------------------------------------   From eval  DIAGNOSTIC  FINDINGS: see MAR   COGNITION: Overall cognitive status: Within functional limits for tasks assessed             SENSATION: WFL   COORDINATION: Bradykinesia, difficulty wit rapid alternating movements     MUSCLE TONE: WFL       POSTURE: No Significant postural limitations   LOWER EXTREMITY ROM:      WFL  (Blank rows = not tested)   LOWER EXTREMITY MMT:     MMT Right Eval Left Eval  Hip flexion 5 4  Hip extension 5 3+  Hip abduction 5 3-  Hip adduction      Hip internal rotation      Hip external rotation      Knee flexion 5 4  Knee extension 5 4  Ankle dorsiflexion 5 4+  Ankle plantarflexion      Ankle inversion      Ankle eversion      (Blank rows = not tested)   BED MOBILITY:  Independent   TRANSFERS: Assistive device utilized: Environmental consultant - 2 wheeled  Sit to stand: Modified independence Stand to sit: Modified independence Chair to chair: SBA and CGA Floor:  DNT   RAMP:  Not available   CURB:  Level of Assistance: SBA and CGA Assistive device utilized: Environmental consultant - 2 wheeled Curb Comments: undershoots with LLE   STAIRS:           Level of Assistance: SBA and CGA           Stair Negotiation Technique: Alternating Pattern  with Bilateral Rails           Number of Stairs: 6             Height of Stairs: 4-6"            Comments: dysmetria w/ LLE   GAIT: Gait pattern: circumduction- Left and ataxic Distance walked: 100 Assistive device utilized: Walker - 2 wheeled Level of assistance: SBA Comments: dysmetria LLE   FUNCTIONAL TESTs:  Timed up and go (TUG): 20.25 with RW Berg Balance Scale: 35/56      M-CTSIB  Condition 1: Firm Surface, EO 30 Sec, Mild Sway  Condition 2: Firm Surface, EC 30 Sec, Mild and Moderate Sway  Condition 3: Foam Surface, EO 9 Sec, Severe Sway  Condition 4: Foam Surface, EC 0 Sec,  unable  Sway     07/02/22 M-CTSIB  Condition 1: Firm Surface, EO 30 Sec, Mild and Moderate Sway  Condition 2: Firm Surface, EC 30 Sec,  Moderate Sway * required RW support to get into romberg  Condition 3: Foam Surface, EO 30 Sec, Severe Sway * required RW support to get into romberg  Condition 4: Foam Surface, EC NT Sec, NT Sway          PATIENT SURVEYS:  FOTO 54.43% functional status   TODAY'S TREATMENT:  Evaluation, sidestepping along counter, tandem stance at counter     PATIENT EDUCATION: Education details: scope of PT intervention Person educated: Patient and Spouse Education method: Explanation Education comprehension: verbalized understanding     HOME EXERCISE PROGRAM: sidestepping along counter, tandem stance at counter   -----------------------------------------------------------------------------------------------------------------------    GOALS: Goals reviewed with patient? Yes   SHORT TERM GOALS: Target date: 05/29/2022   Patient will perform HEP with family/caregiver supervision for improved strength, balance, transfers, and gait  Baseline: Goal status: MET   2.  Patient will achieve 15 seconds for TUG test to manifest reduced risk for falls Baseline: 20.25 with RW; 14 sec with RW Goal status: MET   3. Demo modified independent gait level surfaces and curb negotiation            Baseline: CGA-SBA with RW; unchanged 07/02/22            Goal status: On-going   LONG TERM GOALS: Target date: 08/13/2022   Patient will demonstrate score 45/56 Berg Balance Test to manifest low risk for falls Baseline: 35/56; 37/56 on 06/06/22; 37/56 07/02/22 Goal status: On-going   2.  Demonstrate improved static balance and postural control per time of 15 sec condition 4 M-CTSIB to improve safety with mobility on uneven surfaces Baseline: complete LOB left with condition 3; NT d/t safety 07/02/22 Goal status: On-going   3.  Demo modified independent gait using least restrictive AD over various surfaces and ascend/descend stairs with set-up assist to improve functional mobility Baseline: able to navigate  stairs with B handrail with CGA 07/02/22  Goal status: on-going   4.  Demo score of 57% functional status FOTO  Baseline: 54.43%; 57% 07/02/22  Goal status: MET       ASSESSMENT:   CLINICAL IMPRESSION:  Patient arrived to session with report of some B knee soreness. Worked on stair navigation with reciprocal pattern and focusing on pacing and stability. Weight shifting today focused on finding limits of stability and correcting LOB, particularly to L side. Good response to use of mirror to find midline today. Patient reported some L knee pain and instability but able to tolerate session well despite this.    OBJECTIVE IMPAIRMENTS Abnormal gait, decreased activity tolerance, decreased balance, decreased coordination, decreased endurance, decreased knowledge of use of DME, difficulty walking, decreased strength, impaired perceived functional ability, and improper body mechanics.    ACTIVITY LIMITATIONS carrying, lifting, bending, standing, squatting, stairs, transfers, and locomotion level   PARTICIPATION LIMITATIONS: meal prep, cleaning, laundry, driving, shopping, community activity, and yard work   PERSONAL FACTORS Age, Fitness, and Time since onset of injury/illness/exacerbation are also affecting patient's functional outcome.    REHAB POTENTIAL: Excellent   CLINICAL DECISION MAKING: Evolving/moderate complexity   EVALUATION COMPLEXITY: Moderate   PLAN: PT FREQUENCY: 2x/week   PT DURATION: 6 weeks   PLANNED INTERVENTIONS: Therapeutic exercises, Therapeutic activity, Neuromuscular re-education, Balance training, Gait training, Patient/Family education, Joint mobilization, Stair training, Vestibular training, Canalith repositioning, DME instructions, Aquatic Therapy, Electrical stimulation, Wheelchair mobility training, and Manual therapy   PLAN FOR NEXT SESSION:  balance reactions and balance on uneven surfaces; training with gait in/out of clinic to negotiate curbs and sidewalk  and ability to enter clinic unassisted by staff. Stair ambulation.                    Janene Harvey, PT, DPT 07/04/22 5:03 PM  [facility] [address], [place], [place] [address] Phone # [phone] Fax # [phone]

## 2022-07-04 ENCOUNTER — Telehealth (HOSPITAL_COMMUNITY): Payer: Self-pay | Admitting: *Deleted

## 2022-07-04 ENCOUNTER — Ambulatory Visit: Payer: Medicare Other

## 2022-07-04 ENCOUNTER — Ambulatory Visit: Payer: Medicare Other | Admitting: Occupational Therapy

## 2022-07-04 ENCOUNTER — Ambulatory Visit: Payer: Medicare Other | Admitting: Physical Therapy

## 2022-07-04 ENCOUNTER — Encounter: Payer: Self-pay | Admitting: Physical Therapy

## 2022-07-04 DIAGNOSIS — I69254 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: Secondary | ICD-10-CM

## 2022-07-04 DIAGNOSIS — R2689 Other abnormalities of gait and mobility: Secondary | ICD-10-CM

## 2022-07-04 DIAGNOSIS — R2681 Unsteadiness on feet: Secondary | ICD-10-CM

## 2022-07-04 DIAGNOSIS — D461 Refractory anemia with ring sideroblasts: Secondary | ICD-10-CM | POA: Diagnosis not present

## 2022-07-04 DIAGNOSIS — R278 Other lack of coordination: Secondary | ICD-10-CM

## 2022-07-04 DIAGNOSIS — R262 Difficulty in walking, not elsewhere classified: Secondary | ICD-10-CM

## 2022-07-04 DIAGNOSIS — R41841 Cognitive communication deficit: Secondary | ICD-10-CM

## 2022-07-04 DIAGNOSIS — M6281 Muscle weakness (generalized): Secondary | ICD-10-CM

## 2022-07-04 DIAGNOSIS — R471 Dysarthria and anarthria: Secondary | ICD-10-CM

## 2022-07-04 NOTE — Therapy (Signed)
OUTPATIENT OCCUPATIONAL THERAPY Treatment Note   Patient Name: Vernon [name] MRN: [id number] DOB:05-01-1946, 76 y.o., male Today's Date: 07/04/2022  PCP: Baxter Hire, MD REFERRING PROVIDER: Baxter Hire, MD      OT End of Session - 07/04/22 1501     Visit Number 15    Number of Visits 25    Date for OT Re-Evaluation 08/03/22    Authorization Type UHC Medicare    Authorization Time Period VL: [place]    Progress Note Due on Visit 20    OT Start Time 1450    OT Stop Time 1532    OT Time Calculation (min) 42 min    Activity Tolerance Patient tolerated treatment well    Behavior During Therapy WFL for tasks assessed/performed                   Past Medical History:  Diagnosis Date   Anemia    Aortic stenosis    Arthritis    Complication of anesthesia    hard time getting bp up after knee replacement   Diabetes mellitus without complication (HCC)    GERD (gastroesophageal reflux disease)    occ tums prn   History of hiatal hernia    Hypertension    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[place])    Past Surgical History:  Procedure Laterality Date   CARPAL TUNNEL RELEASE  2012   CRANIOTOMY N/A 02/10/2022   Procedure: SUBOCCIPITAL CRANIECTOMY FOR EVACUATION OF CEREBELLAR HEMATOMA;  Surgeon: Kristeen Miss, MD;  Location: [place] [facility];  Service: Neurosurgery;  Laterality: N/A;   JOINT REPLACEMENT Right 2010   SHOULDER ARTHROSCOPY WITH ROTATOR CUFF REPAIR AND OPEN BICEPS TENODESIS Right 11/09/2019   Procedure: RIGHT SHOULDER ARTHROSCOPY WITH SUBSCAPULARIS REPAIR, SUBACROMIAL DECOMPRESSION,MINI OPEN ROTATOR CUFF REPAIR;  Surgeon: Leim Fabry, MD;  Location: [facility];  Service: Orthopedics;  Laterality: Right;   Patient Active Problem List   Diagnosis Date Noted   Proteinuria, unspecified 05/23/2022   Simple renal cyst 05/23/2022   Long term current use of anticoagulant 04/04/2022   Rotator cuff syndrome 04/04/2022   Exposure to potentially hazardous substance 04/04/2022    Gout 04/04/2022   Osteoarthritis 04/04/2022   Osteoporosis 04/04/2022   Other specified diseases of hair and hair follicles 03/88/8280   Pain in joint, lower leg 04/04/2022   Problem related to unspecified psychosocial circumstances 04/04/2022   General medical examination for administrative purposes 04/04/2022   Stage 3b chronic kidney disease ([place])    Dysphagia, post-stroke    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 hemorrhage of brain ([place]) 02/26/2022   Cough 03/49/1791   Acute metabolic encephalopathy 50/56/9794   Obstructive hydrocephalus ([place]) 02/19/2022   Dyslipidemia 02/19/2022   Dysphagia 02/19/2022   AKI (acute kidney injury) ([place])    Hyperkalemia    Anemia    Pleural effusion on right    Respiratory failure requiring intubation (HCC)    Cerebral edema (HCC)    Chronic atrial fibrillation ([place])    Controlled type 2 diabetes mellitus with hyperglycemia, without long-term current use of insulin (HCC)    ICH (intracerebral hemorrhage) ([place]) 02/10/2022   Intracranial hemorrhage (HCC)    Anticoagulated    Moderate tricuspid regurgitation 07/26/2021   Moderate aortic valve stenosis 03/08/2020   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[place]) 02/18/2020   Macrocytic anemia 01/13/2020   Spleen enlargement 01/13/2020   Bilateral carotid artery stenosis 07/14/2018   Atrial fibrillation, chronic ([place]) 07/14/2018   Type 2 diabetes with nephropathy ([place]) 02/14/2016   Essential hypertension 08/16/2014   Hyperlipemia,  mixed 08/16/2014   Renal insufficiency 08/16/2014    ONSET DATE: 02/10/2022  REFERRING DIAG: I62.9 (ICD-10-CM) - Nontraumatic intracranial hemorrhage, unspecified  THERAPY DIAG:  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  It happened today at my wife's office, where I was falling over and didn't realize it but was able to catch myself."  Pt accompanied by: self  PERTINENT HISTORY: cerebellar hemorrhage w/ shift and early hydrocephalus; emergent decompression surgery w/ Dr. Ellene Route on 02/11/22 w/ EVD pulled 02/14/22. PMH includes DMT2, HTN, HLD, atrial fibrillation on Eliquis, arthritis, anemia, and GERD  PAIN: Are you having pain? No  PATIENT GOALS: Be independent again; play guitar  OBJECTIVE:   TODAY'S TREATMENT:  Open book in sidelying and supine with focus on motor control of LUE.  Closed chain with chest press in supine and external rotation with dowel in BUE.  - Seated Chest Press with Bar  - 4 x weekly - 2-3 sets - 10 reps - Supine external rotation - Sidelying Thoracic Rotation with Open Book  - 4 x weekly - 2-3 sets - 10 reps - Open Book Chest Stretch on Towel Roll  - 4 x weekly - 2-3 sets - 10 reps Nose to finger 8 in 10 seconds on L, 12 in 10 seconds on R.  Feels that his hand has come under some control, but still difficult with the arm control.  "Slow and deliberate" Goal: functional reaching goal, play guitar again   Engaged in dynamic sitting balance and focus on motor control with LUE during reaching activity with placing and removing rings from cones.  Pt demonstrating mild ataxia during reaching to place rings on cone and min-mod when removing.  Pt dropping and knocking over cones 15% of time.  Engaged in reaching outside [place] and across midline to challenge functional reach. Engaged in dynamic standing balance with clothespins. Engaged in reaching in various planes to incorporate across midline, outside BOS, and up and down to challenge balance and functional reach.  Utilizing resistive clothespins (4# and 6#) to place on vertical dowel.  Pt demonstrating increased difficulty when reaching across midline with LUE to place on  vertical dowel, compared to down on R or L of body.  CGA for standing balance and cues to not rely on mat table surface along backs of legs for standing balance.   PATIENT EDUCATION: Ongoing condition-specific education, particularly regarding neuro reed and typical recovery patterns as well as benefit and goal of weight bearing/increased somatosensory input through affected UE Person educated: Patient Education method: Explanation Education comprehension: verbalized understanding   HOME EXERCISE PROGRAM: Coordination handout - see pt instructions  Access Code: [email or web address] URL: [email or web address] Date: 06/04/2022 Prepared by: [facility]  Exercises - Seated Shoulder Flexion with Dowel to 90  - 4 x weekly - 2-3 sets - 10 reps - Seated Chest Press with Bar  - 4 x weekly - 2-3 sets - 10 reps - Single Arm Shoulder Flexion with Dumbbell  - 4 x weekly - 2-3 sets - 10 reps - Seated Shoulder External Rotation with Dumbbell  - 4 x weekly - 2-3 sets - 10 reps   GOALS: Goals reviewed with patient? Yes  SHORT TERM GOALS: Target date: 06/08/22  STG  Status:  1 Pt will demonstrate independence w/ initial HEP designed for LUE GM and [place] Baseline: No HEP at this time Progressing  2 Pt will improve participation in functional bilateral FM tasks as evidenced by decreasing time to complete 9-Hole Peg Test w/ LUE by at least 6 sec Baseline: Right: 21.8  sec; Left: 51.2 sec Progressing -  L: 1:05.35 on 07/04/22  3 Pt will be able to complete simulated bilateral functional task (e.g. cutting food, manipulating clothing fasteners, washing dishes) within reasonable amount of time Baseline: Difficulty w/ bilateral tasks Met - pt reports "I have to be deliberate, but I can get it done"     LONG TERM GOALS: Target date: 07/06/22  LTG  Status:  1 Pt will improve participation in functional activities as evidenced by increasing FOTO score to 71 by  d/c Baseline: 62 Progressing - score 61 on 07/04/22  2 Pt will demonstrate improved control and accuracy w/ LUE by completing 8 taps within 10 seconds during finger to nose test Baseline: Right: 12 taps in 10 sec; Left: 6 taps in 10 sec Met - 8 taps in 10 seconds (while stabilizing elbow at side)  3 Pt will improve Box and Blocks score to at least 40 blocks by d/c to indicate improved unilateral GMC of LUE Baseline: Right 61 blocks, Left 29 blocks Progressing - 07/04/22 - 36 blocks  4 Pt will safely demonstrate simulated grilling activity, incorporating compensatory strategies/AE prn, w/ Mod I by d/c Baseline: Unable to complete at this time Progressing - have not be walking outside    Target date: 08/03/22 ***  LTG  Status:  1 Pt will improve participation in functional activities as evidenced by increasing FOTO score to 71 by d/c Baseline: 62 Progressing  2 Pt will demonstrate improved control and accuracy w/ LUE by completing 8 taps within 10 seconds during finger to nose test Baseline: Right: 12 taps in 10 sec; Left: 6 taps in 10 sec Progressing - 8 taps in 10 seconds (while stabilizing elbow at side)  3 Pt will improve Box and Blocks score to at least 40 blocks by d/c to indicate improved unilateral GMC of LUE Baseline: Right 61 blocks, Left 29 blocks Progressing - 06/28/22 - 35 blocks  4 Pt will safely demonstrate simulated grilling activity, incorporating compensatory strategies/AE prn, w/ Mod I by d/c Baseline: Unable to complete at this time Progressing     ASSESSMENT:  CLINICAL IMPRESSION: Treatment session with focus on closed and open chain movements during functional reaching tasks.  Pt demonstrates decreased impact of ataxia when able to increase proprioceptive input through UE by either closed chain on surface or stabilizing elbow along torso.  Pt continues to demonstrate ataxia with both gross and fine motor tasks, benefiting from verbal cues for pacing, as decreased speed  facilitates increased accuracy and success. Pt will continue to benefit from WB and closed chain activities to incorporate into functional tasks to decrease effects of ataxia on functional reach.  PERFORMANCE DEFICITS in functional skills including ADLs, IADLs, coordination, dexterity, sensation, strength, FMC, GMC, mobility, balance, body mechanics, and UE functional use.  IMPAIRMENTS are limiting patient from ADLs, IADLs, and leisure.   COMORBIDITIES has co-morbidities such as atrial fibrillation on Eliquis and DMT2  that affects occupational performance. Patient will benefit from skilled OT to address above impairments and improve overall function.  MODIFICATION OR ASSISTANCE TO COMPLETE EVALUATION: Min-Moderate modification of tasks or assist with  assess necessary to complete an evaluation.  OT OCCUPATIONAL PROFILE AND HISTORY: Detailed assessment: Review of records and additional review of physical, cognitive, psychosocial history related to current functional performance.  CLINICAL DECISION MAKING: Moderate - several treatment options, min-mod task modification necessary  REHAB POTENTIAL: Good  EVALUATION COMPLEXITY: Moderate   PLAN: OT FREQUENCY: 2x/week  OT DURATION: 8 weeks  PLANNED INTERVENTIONS: self care/ADL training, therapeutic exercise, therapeutic activity, neuromuscular re-education, manual therapy, functional mobility training, aquatic therapy, electrical stimulation, ultrasound, biofeedback, moist heat, cryotherapy, patient/family education, energy conservation, and DME and/or AE instructions  RECOMMENDED OTHER SERVICES: To receive PT/ST services at this location  CONSULTED AND AGREED WITH PLAN OF CARE: Patient and family member/caregiver  PLAN FOR NEXT SESSION:   GMC/FMC activities for LUE. Seated vs standing weight-bearing, closed-chain exercise, coordination activities, etc.  Functional in nature, if possible, to carry over to pt desire to play guitar again.  Assess goals and re-cert if recommended.   Simonne Come, OTR/L 07/04/2022, 3:02 PM

## 2022-07-04 NOTE — Therapy (Signed)
OUTPATIENT SPEECH LANGUAGE PATHOLOGY TREATMENT NOTE   Patient Name: Vernon [name] MRN: [id number] DOB:10-26-1946, 76 y.o., male Today's Date: 07/04/2022  PCP: Baxter Hire, MD  REFERRING PROVIDER: Baxter Hire, MD   END OF SESSION:   End of Session - 07/04/22 1615     Visit Number 14    Number of Visits 25    Date for SLP Re-Evaluation 07/26/22    Authorization Type UHC    SLP Start Time 1533    SLP Stop Time  1615    SLP Time Calculation (min) 42 min    Activity Tolerance Patient tolerated treatment well                   Past Medical History:  Diagnosis Date   Anemia    Aortic stenosis    Arthritis    Complication of anesthesia    hard time getting bp up after knee replacement   Diabetes mellitus without complication (HCC)    GERD (gastroesophageal reflux disease)    occ tums prn   History of hiatal hernia    Hypertension    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[place])    Past Surgical History:  Procedure Laterality Date   CARPAL TUNNEL RELEASE  2012   CRANIOTOMY N/A 02/10/2022   Procedure: SUBOCCIPITAL CRANIECTOMY FOR EVACUATION OF CEREBELLAR HEMATOMA;  Surgeon: Kristeen Miss, MD;  Location: [facility];  Service: Neurosurgery;  Laterality: N/A;   JOINT REPLACEMENT Right 2010   SHOULDER ARTHROSCOPY WITH ROTATOR CUFF REPAIR AND OPEN BICEPS TENODESIS Right 11/09/2019   Procedure: RIGHT SHOULDER ARTHROSCOPY WITH SUBSCAPULARIS REPAIR, SUBACROMIAL DECOMPRESSION,MINI OPEN ROTATOR CUFF REPAIR;  Surgeon: Leim Fabry, MD;  Location: [facility];  Service: Orthopedics;  Laterality: Right;   Patient Active Problem List   Diagnosis Date Noted   Proteinuria, unspecified 05/23/2022   Simple renal cyst 05/23/2022   Long term current use of anticoagulant 04/04/2022   Rotator cuff syndrome 04/04/2022   Exposure to potentially hazardous substance 04/04/2022   Gout 04/04/2022   Osteoarthritis 04/04/2022   Osteoporosis 04/04/2022   Other specified diseases of hair and hair  follicles 94/49/6759   Pain in joint, lower leg 04/04/2022   Problem related to unspecified psychosocial circumstances 04/04/2022   General medical examination for administrative purposes 04/04/2022   Stage 3b chronic kidney disease ([place])    Dysphagia, post-stroke    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 hemorrhage of brain ([place]) 02/26/2022   Cough 16/38/4665   Acute metabolic encephalopathy 99/35/7017   Obstructive hydrocephalus ([place]) 02/19/2022   Dyslipidemia 02/19/2022   Dysphagia 02/19/2022   AKI (acute kidney injury) ([place])    Hyperkalemia    Anemia    Pleural effusion on right    Respiratory failure requiring intubation (HCC)    Cerebral edema (HCC)    Chronic atrial fibrillation ([place])    Controlled type 2 diabetes mellitus with hyperglycemia, without long-term current use of insulin (HCC)    ICH (intracerebral hemorrhage) ([place]) 02/10/2022   Intracranial hemorrhage (HCC)    Anticoagulated    Moderate tricuspid regurgitation 07/26/2021   Moderate aortic valve stenosis 03/08/2020   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 Notes his LUE and LLE are most affected at present. Underwent acute hospitalization, and [facility]. Recently D/C from home health on 04/30/22. Did not receive HHST, only HHPT/OT.     ONSET DATE: 02/10/22   REFERRING DIAG: I62.9 (ICD-10-CM) - Nontraumatic intracranial hemorrhage, unspecified   THERAPY DIAG:  Dysarthria and anarthria  Cognitive communication deficit  Rationale for Evaluation and Treatment  Rehabilitation  SUBJECTIVE: "Vernon [name] had some things to do at the shop."   PAIN:  Are you having pain? No   OBJECTIVE:   TODAY'S TREATMENT: 07/04/22: Pt is unsure if ENT appointment has been made yet. Today pt answered questions about his schedule with 95% accuracy. He performed his PHOrTE exercises with rare min A. SLP and pt agreed he could decr to once/week. Verbally, pt told me three sequences with WNL organization and memory.  8/7/23[name] entered tx room and correctly told SLP appointments today and next therapy day (Wednesday). He told SLP challenge with checkbook is with legibility due to spaces being too thin so he is using two spaces. SLP agreed with pt with this. He performed PHOrTE HEP with SBA with pitch variability with numbers. SLP told pt SLP is recommending ENT consult due to voice still not WNL - but it has improved since initial eval. SLP got two names of ENTs in [place] area as pt is from [employer]. SLP provided these names for pt and asked pt to show wife, which SLP learned he did after his last tx session today, when wife arrived.   06/28/22: Pt was late getting into ST today due to aide not comfortable transferring pt/walking pt in to clinic. SLP provided occasional min- mod A faded to rare min A for abdominal breathing with PHOrTE tasks, but other procedural steps were WNL. With calendar tasks, pt tracked appointments WNL, but had mistake that he was unaware of re: end time.  06/25/22: SLP reminded pt that he had difficulty last session with ID that day's therapy sessions, when he returned, etc. SLP asked pt to tell the sessions today which he did correctly. When SLP asked pt to tell the sequence of next appointments, he was incorrect and req'd SLP A for correction. This occurred due to pt taking out the *entire page* of a horizontal bifold calendar to look at what appointments were for next month- thus pt was looking at [facility] months and req'd correction for error  awareness.  Pt tells SLP he maintains BID HEP completion. Pt states he feels stronger voice now with upper and lower pitches and the middle of the range is where he feels the weakest. Vernon [name] came in to [place] and she and pt did HEP together. Pt req'd min A rarely for making sure low-hi-low numbers started at lowest pitch, slowing sentence reps, and that he was using abdominal breathing (AB) for all productions. Vernon [name] stated pt had difficulty balancing checkbook ("for every 4 right thing sthere would be one wrong one"). SLP explained Jacques is having more difficulty with error awareness and attention to details at this time and that was another example (first being calendar task today).   06/21/22: "I have to get a CT and then they will tell me what the plan is (for cardiac sx)." Pt with WNL voicing for intermittent segments of 20-30 seconds during ST today, x9. SLP had pt perform the HEP (PHoRTE) and pt req'd rare min A; usually SLP needing to provide more intense cueing than that level - SLP attributes this  to pt performing with wife correctly, and consistently. Pt req'd mod-max A tfor appointment tracking - told SLP that  Monday's appointments were today's and they were wrong on the sheet. When SLP asked pt for the date today, pt looked at SLP calendar and then pt looked elsewhere for today's date and then realized he was telling me appointments for next week and not for today.  Pt repeated story to SLP he told earlier in session x1.    06/14/22: SLP engaged pt with his PHoRTE HEP today in presence of wife, given the fact Stelios was not performing HEP correctly spontaneously previous session. SLP reiterated for pt/wife today how important correct completion of PHoRTE is, and that if pt's voice is not notably improved in 4 weeks ENT consult may be warranted. With occasional min A faded to rare min A for breath support, and occasional min A for "sigh" warm ups and for pitch lo-hi-lo numbers, pt completed HEP.  fPrior to this, pt req'd shaping of abdominal breathing (AB) due to clavicular/thoracic breathing. Wife asking questions about PHoRTE throughout session and SLP answering these. SLP told [place] that [name] needed to "play the role of [name]" if pt is not completing exercises correctly at home. Pt acknowledged this.  Pt stated SLP did not put his PHoRTE HEP back in his folder after previous session but HEP was in pocket inside front cover of notebook.     PATIENT EDUCATION: Education details: See "treatment" above Person educated: Patient Education method: Explanation, demo, Verbal cues Education comprehension: verbalized understanding, returned demonstration, verbal cues required, and needs further education      GOALS: Goals reviewed with patient? Yes   SHORT TERM GOALS: Target date: 05/31/22 Pt will complete HEP for voice/dysarthria with occasional min A over 2 sessions Baseline: 05/24/22 Goal status: Met   2.  Pt will use external aids to recall daily events/schedule with occasional min A from spouse over 1 week Baseline:  Goal status: Deferred   3.  Pt will achieve clear phonation 18/20 sentences in structured task Baseline:  Goal status: Not met   4.  Pt will ID dysnomic errors and self correct with rare min A over 2 sessions Baseline:  Goal status: Deferred     LONG TERM GOALS: Target date: 07/26/22 Pt will achieve clear phonation over 10 minute conversation with rare min A over 2 sessions Baseline:  Goal status: Revised   2.  Pt will independently recall weekly/daily schedule, appointments events and pertinent information with memory aids over 2 sessions Baseline: 07-02-22 Goal status: [place]   3.  Pt will complete vocal warm ups and follow 3 vocal hygiene strategies to return to singing for 5-10 minute periods with rare min A Baseline:  Goal status: Ongoing   4.  Improve score on Cognitive function PROM by 4 points Baseline: 101- filled out by spouse Goal status:  Ongoing   ASSESSMENT:   CLINICAL IMPRESSION: Patient is a 76 y.o. male who is seen for dysarthria/voice and cognitive linguistics s/p CVA. See tx note. Today pt req'd SBA for almost omitting numbers. Pt appeared to fatigue following 8 reps of /a/, 6-7 reps of numbers, and 5-6 sentences. Pt did not have difficulty with memory for appointments with calendar tasks, nor with verbal sequencing - appropriate and sequential/organization. Ramzy is a Therapist, nutritional and sings for fun and at church. ENT consult was suggested. I recommend cont'd skilled ST to maximize voice and cognition for safety, to reduce caregiver burden and QOL. Vernon [name] and  SLP agreed pt oculd reduce to once/week.   OBJECTIVE IMPAIRMENTS include attention, memory, awareness, dysarthria, and voice disorder. These impairments are limiting patient from managing appointments, household responsibilities, and effectively communicating at home and in community. Factors affecting potential to achieve goals and functional outcome are ability to learn/carryover information.. Patient will benefit from skilled SLP services to address above impairments and improve overall function.   REHAB POTENTIAL: Good   PLAN: SLP FREQUENCY: 2x/week   SLP DURATION: 12 weeks   PLANNED INTERVENTIONS: Language facilitation, Environmental controls, Cueing hierachy, Cognitive reorganization, Internal/external aids, Functional tasks, Multimodal communication approach, and SLP instruction and feedback         [place], [place] 07/04/2022, 4:16 PM

## 2022-07-04 NOTE — Telephone Encounter (Signed)
Reaching out to patient to offer assistance regarding upcoming cardiac imaging study; pt's wife answered phone and verbalizes understanding of appt date/time, parking situation and where to check in, pre-test NPO status, and verified current allergies; name and call back number provided for further questions should they arise  [name] RN [facility] [phone] office [phone] cell  Patient's wife is aware he is to arrive at 1:30pm.

## 2022-07-05 ENCOUNTER — Ambulatory Visit: Payer: Medicare Other | Admitting: Physical Medicine & Rehabilitation

## 2022-07-06 ENCOUNTER — Encounter (HOSPITAL_COMMUNITY): Payer: Self-pay

## 2022-07-09 ENCOUNTER — Ambulatory Visit: Payer: Medicare Other

## 2022-07-09 ENCOUNTER — Telehealth (HOSPITAL_COMMUNITY): Payer: Self-pay | Admitting: *Deleted

## 2022-07-09 DIAGNOSIS — M6281 Muscle weakness (generalized): Secondary | ICD-10-CM

## 2022-07-09 DIAGNOSIS — R2681 Unsteadiness on feet: Secondary | ICD-10-CM

## 2022-07-09 DIAGNOSIS — D461 Refractory anemia with ring sideroblasts: Secondary | ICD-10-CM | POA: Diagnosis not present

## 2022-07-09 DIAGNOSIS — R2689 Other abnormalities of gait and mobility: Secondary | ICD-10-CM

## 2022-07-09 DIAGNOSIS — R471 Dysarthria and anarthria: Secondary | ICD-10-CM

## 2022-07-09 DIAGNOSIS — R41841 Cognitive communication deficit: Secondary | ICD-10-CM

## 2022-07-09 DIAGNOSIS — R262 Difficulty in walking, not elsewhere classified: Secondary | ICD-10-CM

## 2022-07-09 NOTE — Therapy (Unsigned)
OUTPATIENT SPEECH LANGUAGE PATHOLOGY TREATMENT NOTE   Patient Name: Vernon [name] MRN: [id number] DOB:[birth year], 76 y.o., male Today's Date: 07/10/2022  PCP: Baxter Hire, MD  REFERRING PROVIDER: Baxter Hire, MD   END OF SESSION:   End of Session - 07/10/22 0851     Visit Number 15    Number of Visits 25    Date for SLP Re-Evaluation 07/26/22    Authorization Type UHC    SLP Start Time 1533    SLP Stop Time  1615    SLP Time Calculation (min) 42 min    Activity Tolerance Patient tolerated treatment well                    Past Medical History:  Diagnosis Date   Anemia    Aortic stenosis    Arthritis    Complication of anesthesia    hard time getting bp up after knee replacement   Diabetes mellitus without complication (HCC)    GERD (gastroesophageal reflux disease)    occ tums prn   History of hiatal hernia    Hypertension    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[place])    Past Surgical History:  Procedure Laterality Date   CARPAL TUNNEL RELEASE  2012   CRANIOTOMY N/A 02/10/2022   Procedure: SUBOCCIPITAL CRANIECTOMY FOR EVACUATION OF CEREBELLAR HEMATOMA;  Surgeon: Kristeen Miss, MD;  Location: [facility];  Service: Neurosurgery;  Laterality: N/A;   JOINT REPLACEMENT Right 2010   SHOULDER ARTHROSCOPY WITH ROTATOR CUFF REPAIR AND OPEN BICEPS TENODESIS Right 11/09/2019   Procedure: RIGHT SHOULDER ARTHROSCOPY WITH SUBSCAPULARIS REPAIR, SUBACROMIAL DECOMPRESSION,MINI OPEN ROTATOR CUFF REPAIR;  Surgeon: Leim Fabry, MD;  Location: [facility];  Service: Orthopedics;  Laterality: Right;   Patient Active Problem List   Diagnosis Date Noted   Proteinuria, unspecified 05/23/2022   Simple renal cyst 05/23/2022   Long term current use of anticoagulant 04/04/2022   Rotator cuff syndrome 04/04/2022   Exposure to potentially hazardous substance 04/04/2022   Gout 04/04/2022   Osteoarthritis 04/04/2022   Osteoporosis 04/04/2022   Other specified diseases of hair and hair  follicles 71/24/5809   Pain in joint, lower leg 04/04/2022   Problem related to unspecified psychosocial circumstances 04/04/2022   General medical examination for administrative purposes 04/04/2022   Stage 3b chronic kidney disease ([place])    Dysphagia, post-stroke    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 hemorrhage of brain ([place]) 02/26/2022   Cough 98/33/8250   Acute metabolic encephalopathy 53/97/6734   Obstructive hydrocephalus ([place]) 02/19/2022   Dyslipidemia 02/19/2022   Dysphagia 02/19/2022   AKI (acute kidney injury) ([place])    Hyperkalemia    Anemia    Pleural effusion on right    Respiratory failure requiring intubation (HCC)    Cerebral edema (HCC)    Chronic atrial fibrillation ([place])    Controlled type 2 diabetes mellitus with hyperglycemia, without long-term current use of insulin (HCC)    ICH (intracerebral hemorrhage) ([place]) 02/10/2022   Intracranial hemorrhage (HCC)    Anticoagulated    Moderate tricuspid regurgitation 07/26/2021   Moderate aortic valve stenosis 03/08/2020   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 Notes his LUE and LLE are most affected at present. Underwent acute hospitalization, and [facility]. Recently D/C from home health on 04/30/22. Did not receive HHST, only HHPT/OT.     ONSET DATE: 02/10/22   REFERRING DIAG: I62.9 (ICD-10-CM) - Nontraumatic intracranial hemorrhage, unspecified   THERAPY DIAG:  Dysarthria and anarthria  Cognitive communication deficit  Rationale for Evaluation and Treatment  Rehabilitation  SUBJECTIVE: "Vernon [name] had a late appointment at the shop. She'll be by to pick me up."  (Re: HEP)"We do it two times a day, every day."  PAIN:  Are you having pain? No   OBJECTIVE:   TODAY'S TREATMENT: 07/09/22: Vernon [name] reported the ENT appointment is in process. Questions about his therapy schedule were answered with excellent success. Pt told SLP about upcoming appointments, correctly, with intermittent use of calendar. Vernon [name] performed PHOrTE exercises with modified independence and stated, "I've seen some good progress with my voice." Pt  fatigued following 8 reps of /a/, 7-8 reps of numbers, after 7-8 high-pitched sentences, and did not appear to fatigue with low-pitched sentences. Pt agreed that two more sessions were appropriate, once/week.  07/04/22: Pt is unsure if ENT appointment has been made yet. Today pt answered questions about his schedule with 95% accuracy. He performed his PHOrTE exercises with rare min A. SLP and pt agreed he could decr to once/week. Verbally, pt told me three sequences with WNL organization and memory.  8/7/23[name] entered tx room and correctly told SLP appointments today and next therapy day (Wednesday). He told SLP challenge with checkbook is with legibility due to spaces being too thin so he is using two spaces. SLP agreed with pt with this. He performed PHOrTE HEP with SBA with pitch variability with numbers. SLP told pt SLP is recommending ENT consult due to voice still not WNL - but it has improved since initial eval. SLP got two names of ENTs in [place] area as pt is from [employer] [facility] [employer]. SLP provided these names for pt and asked pt to show wife, which SLP learned he did after his last tx session today, when wife arrived.   06/28/22: Pt was late getting into ST today due to aide not comfortable transferring pt/walking pt in to clinic. SLP provided occasional min- mod A faded to rare min A for abdominal breathing with PHOrTE tasks, but other  procedural steps were WNL. With calendar tasks, pt tracked appointments WNL, but had mistake that he was unaware of re: end time.  06/25/22: SLP reminded pt that he had difficulty last session with ID that day's therapy sessions, when he returned, etc. SLP asked pt to tell the sessions today which he did correctly. When SLP asked pt to tell the sequence of next appointments, he was incorrect and req'd SLP A for correction. This occurred due to pt taking out the *entire page* of a horizontal bifold calendar to look at what appointments were for next month- thus pt was looking at [facility] months and req'd correction for error awareness.  Pt tells SLP he maintains BID HEP completion. Pt states he feels stronger voice now with upper and lower pitches and the middle of the range is where he feels the weakest. Vernon [name] came in to [place] and she and pt did HEP together. Pt req'd min A rarely for making sure low-hi-low numbers started at lowest pitch, slowing sentence reps, and that he was using abdominal breathing (AB) for all productions. Vernon [name] stated pt had difficulty balancing checkbook ("for every  4 right thing sthere would be one wrong one"). SLP explained Vernon [name] is having more difficulty with error awareness and attention to details at this time and that was another example (first being calendar task today).   06/21/22: "I have to get a CT and then they will tell me what the plan is (for cardiac sx)." Pt with WNL voicing for intermittent segments of 20-30 seconds during ST today, x9. SLP had pt perform the HEP (PHoRTE) and pt req'd rare min A; usually SLP needing to provide more intense cueing than that level - SLP attributes this to pt performing with wife correctly, and consistently. Pt req'd mod-max A tfor appointment tracking - told SLP that  Monday's appointments were today's and they were wrong on the sheet. When SLP asked pt for the date today, pt looked at SLP calendar and then pt looked elsewhere for  today's date and then realized he was telling me appointments for next week and not for today.  Pt repeated story to SLP he told earlier in session x1.       PATIENT EDUCATION: Education details: See "treatment" above Person educated: Patient Education method: Explanation, demo, Verbal cues Education comprehension: verbalized understanding, returned demonstration, verbal cues required, and needs further education      GOALS: Goals reviewed with patient? Yes   SHORT TERM GOALS: Target date: 05/31/22 Pt will complete HEP for voice/dysarthria with occasional min A over 2 sessions Baseline: 05/24/22 Goal status: Met   2.  Pt will use external aids to recall daily events/schedule with occasional min A from spouse over 1 week Baseline:  Goal status: Deferred   3.  Pt will achieve clear phonation 18/20 sentences in structured task Baseline:  Goal status: Not met   4.  Pt will ID dysnomic errors and self correct with rare min A over 2 sessions Baseline:  Goal status: Deferred     LONG TERM GOALS: Target date: 07/26/22 Pt will achieve clear phonation over 10 minute conversation with rare min A over 2 sessions Baseline:  Goal status: Revised   2.  Pt will independently recall weekly/daily schedule, appointments events and pertinent information with memory aids over 2 sessions Baseline: 07-02-22 Goal status: Met   3.  Pt will complete vocal warm ups and follow 3 vocal hygiene strategies to return to singing for 5-10 minute periods with rare min A Baseline:  Goal status: Ongoing   4.  Improve score on Cognitive function PROM by 4 points Baseline: 101- filled out by spouse Goal status: Ongoing   ASSESSMENT:   CLINICAL IMPRESSION: Patient is a 76 y.o. male who is seen for dysarthria/voice and cognitive linguistics s/p CVA. See tx note. Pt did not have difficulty with memory for appointments with calendar tasks, nor with verbal sequencing - appropriate and sequential/organization.  Odarius is a Therapist, nutritional and sings for fun and at church. ENT consult was suggested. I recommend cont'd skilled ST to maximize voice and cognition for safety, to reduce caregiver burden and QOL. Vernon [name] and SLP agreed pt oculd reduce to once/week.   OBJECTIVE IMPAIRMENTS include attention, memory, awareness, dysarthria, and voice disorder. These impairments are limiting patient from managing appointments, household responsibilities, and effectively communicating at home and in community. Factors affecting potential to achieve goals and functional outcome are ability to learn/carryover information.. Patient will benefit from skilled SLP services to address above impairments and improve overall function.   REHAB POTENTIAL: Good   PLAN: SLP FREQUENCY: 1x/week   SLP DURATION: 12 weeks  PLANNED INTERVENTIONS: Language facilitation, Environmental controls, Cueing hierachy, Cognitive reorganization, Internal/external aids, Functional tasks, Multimodal communication approach, and SLP instruction and feedback         [place], [place] 07/10/2022, 8:51 AM

## 2022-07-09 NOTE — Telephone Encounter (Signed)
Attempted to call patient regarding upcoming cardiac CT appointment. °Left message on voicemail with name and callback number ° °Mirah Nevins RN Navigator Cardiac Imaging °[facility] °[phone] Office °[phone] Cell ° °

## 2022-07-09 NOTE — Therapy (Signed)
OUTPATIENT PHYSICAL THERAPY TREATMENT  Patient Name: Vernon [name] MRN: [id number] DOB:1945/12/08, 76 y.o., male Today's Date: 07/09/2022  PCP: Baxter Hire, MD  REFERRING PROVIDER: Baxter Hire, MD       END OF SESSION:   PT End of Session - 07/09/22 1458     Visit Number 17    Number of Visits 27    Date for PT Re-Evaluation 08/13/22    Authorization Type [employer] Medicare    Progress Note Due on Visit 39    PT Start Time 1455   late arrival   PT Stop Time 1530    PT Time Calculation (min) 35 min    Equipment Utilized During Treatment Gait belt    Activity Tolerance Patient tolerated treatment well    Behavior During Therapy [facility] for tasks assessed/performed;Impulsive                  Past Medical History:  Diagnosis Date   Anemia    Aortic stenosis    Arthritis    Complication of anesthesia    hard time getting bp up after knee replacement   Diabetes mellitus without complication (HCC)    GERD (gastroesophageal reflux disease)    occ tums prn   History of hiatal hernia    Hypertension    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[place])    Past Surgical History:  Procedure Laterality Date   CARPAL TUNNEL RELEASE  2012   CRANIOTOMY N/A 02/10/2022   Procedure: SUBOCCIPITAL CRANIECTOMY FOR EVACUATION OF CEREBELLAR HEMATOMA;  Surgeon: Kristeen Miss, MD;  Location: [place];  Service: Neurosurgery;  Laterality: N/A;   JOINT REPLACEMENT Right 2010   SHOULDER ARTHROSCOPY WITH ROTATOR CUFF REPAIR AND OPEN BICEPS TENODESIS Right 11/09/2019   Procedure: RIGHT SHOULDER ARTHROSCOPY WITH SUBSCAPULARIS REPAIR, SUBACROMIAL DECOMPRESSION,MINI OPEN ROTATOR CUFF REPAIR;  Surgeon: Leim Fabry, MD;  Location: [facility];  Service: Orthopedics;  Laterality: Right;   Patient Active Problem List   Diagnosis Date Noted   Proteinuria, unspecified 05/23/2022   Simple renal cyst 05/23/2022   Long term current use of anticoagulant 04/04/2022   Rotator cuff syndrome 04/04/2022   Exposure to  potentially hazardous substance 04/04/2022   Gout 04/04/2022   Osteoarthritis 04/04/2022   Osteoporosis 04/04/2022   Other specified diseases of hair and hair follicles 63/33/5456   Pain in joint, lower leg 04/04/2022   Problem related to unspecified psychosocial circumstances 04/04/2022   General medical examination for administrative purposes 04/04/2022   Stage 3b chronic kidney disease ([place])    Dysphagia, post-stroke    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 hemorrhage of brain ([place]) 02/26/2022   Cough 25/63/8937   Acute metabolic encephalopathy 34/28/7681   Obstructive hydrocephalus ([place]) 02/19/2022   Dyslipidemia 02/19/2022   Dysphagia 02/19/2022   AKI (acute kidney injury) ([place])    Hyperkalemia    Anemia    Pleural effusion on right    Respiratory failure requiring intubation (HCC)    Cerebral edema (HCC)    Chronic atrial fibrillation ([place])    Controlled type 2 diabetes mellitus with hyperglycemia, without long-term current use of insulin (HCC)    ICH (intracerebral hemorrhage) ([place]) 02/10/2022   Intracranial hemorrhage (HCC)    Anticoagulated    Moderate tricuspid regurgitation 07/26/2021   Moderate aortic valve stenosis 03/08/2020   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[place]) 02/18/2020   Macrocytic anemia 01/13/2020   Spleen enlargement 01/13/2020   Bilateral carotid artery stenosis 07/14/2018   Atrial fibrillation, chronic ([place]) 07/14/2018   Type 2 diabetes with nephropathy ([place]) 02/14/2016  Essential hypertension 08/16/2014   Hyperlipemia, mixed 08/16/2014   Renal insufficiency 08/16/2014    REFERRING DIAG: I62.9 (ICD-10-CM) - Nontraumatic intracranial hemorrhage, unspecified   THERAPY DIAG:  Muscle weakness (generalized)  Unsteadiness on feet  Other abnormalities of gait and mobility  Difficulty in walking, not elsewhere classified  Rationale for Evaluation and Treatment Rehabilitation  PERTINENT HISTORY: see MAR  PRECAUTIONS: fall risk  SUBJECTIVE: The left knee feels weak  today, that happens when I sit around too much  PAIN:  Are you having pain? Yes; B knee "aching" 4/10  OBJECTIVE:   TODAY'S TREATMENT: 07/09/22 Activity Comments  NU-step level 5 x 5 min   Lateral weight shift and reach 1x10  Lateral step and reach with 4.4# ball 1x10--difficulty with coming to midline weight shift, anterior LOB  Anterior weight shifting 2x10 using 4.4#  Action like bowling ball roll, unilat UE support.   Gait training w/ RW Supervision on level surfaces difficulty negotiating obstacles and turns requiring verbal cues x 150 ft         TODAY'S TREATMENT: 07/04/22 Activity Comments  Nustep L5 x 3 min, L3 x 3 min Cues to maintain ~ 65 SPM  stair navigation with 1 HR x7 Alternating reciprocal pattern with 1 handrail; tendency to lean L but improved with cueing  ant/pos wt shifts feet apart Trunk ataxia/sway but pretty good ability to maintain balance despite this;   ant/pos wt shifts with feet staggered Initially with consistent LOB to L with poor self-correction; much improved with use of verbal and mirror feedback, telling patient to watch his shoulders and hips   standing wide on foam EO/EC 30" CGA d/t sway but no LOB  standing on ramp EO/EC 30" CGA; cues for "belly button forward" to address posterior LOB  fwd/back stepping 10x each LE CGA and 1 UE support; mirror feedback and cues to shift R  lateral stepping over hurdle With B UE support; cueing to shift fully onto standing LE before stepping other foot over   backwards walking In II bars with CGA and cueing to maintain widened BOS       HOME EXERCISE PROGRAM Last updated: 07/03/22 Access Code: [email or web address] URL: [email or web address] Date: 07/02/2022 Prepared by: [facility]  Exercises - Seated Hip Abduction  - 1 x daily - 5 x weekly - 2 sets - 10 reps - Seated Long Arc Quad  - 1 x daily - 5 x weekly - 2 sets - 10 reps - Sit to Stand with Counter Support  - 1  x daily - 5 x weekly - 2 sets - 10 reps - Standing Hip Abduction with Counter Support  - 1 x daily - 5 x weekly - 2 sets - 10 reps - Standing Alternating Knee Flexion with Ankle Weights  - 1 x daily - 5 x weekly - 2 sets - 10 reps   -------------------------------------------------------------------------------------------------------------------------   From eval  DIAGNOSTIC FINDINGS: see MAR   COGNITION: Overall cognitive status: Within functional limits for tasks assessed             SENSATION: WFL   COORDINATION: Bradykinesia, difficulty wit rapid alternating movements     MUSCLE TONE: WFL       POSTURE: No Significant postural limitations   LOWER EXTREMITY ROM:      WFL  (Blank rows = not tested)   LOWER EXTREMITY MMT:     MMT Right Eval Left Eval  Hip flexion 5 4  Hip extension 5 3+  Hip abduction 5 3-  Hip adduction      Hip internal rotation      Hip external rotation      Knee flexion 5 4  Knee extension 5 4  Ankle dorsiflexion 5 4+  Ankle plantarflexion      Ankle inversion      Ankle eversion      (Blank rows = not tested)   BED MOBILITY:  Independent   TRANSFERS: Assistive device utilized: Environmental consultant - 2 wheeled  Sit to stand: Modified independence Stand to sit: Modified independence Chair to chair: SBA and CGA Floor:  DNT   RAMP:  Not available   CURB:  Level of Assistance: SBA and CGA Assistive device utilized: Environmental consultant - 2 wheeled Curb Comments: undershoots with LLE   STAIRS:           Level of Assistance: SBA and CGA           Stair Negotiation Technique: Alternating Pattern  with Bilateral Rails           Number of Stairs: 6             Height of Stairs: 4-6"            Comments: dysmetria w/ LLE   GAIT: Gait pattern: circumduction- Left and ataxic Distance walked: 100 Assistive device utilized: Walker - 2 wheeled Level of assistance: SBA Comments: dysmetria LLE   FUNCTIONAL TESTs:  Timed up and go (TUG): 20.25 with  RW Berg Balance Scale: 35/56      M-CTSIB  Condition 1: Firm Surface, EO 30 Sec, Mild Sway  Condition 2: Firm Surface, EC 30 Sec, Mild and Moderate Sway  Condition 3: Foam Surface, EO 9 Sec, Severe Sway  Condition 4: Foam Surface, EC 0 Sec,  unable  Sway     07/02/22 M-CTSIB  Condition 1: Firm Surface, EO 30 Sec, Mild and Moderate Sway  Condition 2: Firm Surface, EC 30 Sec, Moderate Sway * required RW support to get into romberg  Condition 3: Foam Surface, EO 30 Sec, Severe Sway * required RW support to get into romberg  Condition 4: Foam Surface, EC NT Sec, NT Sway          PATIENT SURVEYS:  FOTO 54.43% functional status   TODAY'S TREATMENT:  Evaluation, sidestepping along counter, tandem stance at counter     PATIENT EDUCATION: Education details: scope of PT intervention Person educated: Patient and Spouse Education method: Explanation Education comprehension: verbalized understanding     HOME EXERCISE PROGRAM: sidestepping along counter, tandem stance at counter   -----------------------------------------------------------------------------------------------------------------------    GOALS: Goals reviewed with patient? Yes   SHORT TERM GOALS: Target date: 05/29/2022   Patient will perform HEP with family/caregiver supervision for improved strength, balance, transfers, and gait  Baseline: Goal status: MET   2.  Patient will achieve 15 seconds for TUG test to manifest reduced risk for falls Baseline: 20.25 with RW; 14 sec with RW Goal status: MET   3. Demo modified independent gait level surfaces and curb negotiation            Baseline: CGA-SBA with RW; unchanged 07/02/22            Goal status: On-going   LONG TERM GOALS: Target date: 08/13/2022   Patient will demonstrate score 45/56 Berg Balance Test to manifest low risk for falls Baseline: 35/56; 37/56 on 06/06/22; 37/56 07/02/22 Goal status: On-going   2.  Demonstrate improved static  balance and  postural control per time of 15 sec condition 4 M-CTSIB to improve safety with mobility on uneven surfaces Baseline: complete LOB left with condition 3; NT d/t safety 07/02/22 Goal status: On-going   3.  Demo modified independent gait using least restrictive AD over various surfaces and ascend/descend stairs with set-up assist to improve functional mobility Baseline: able to navigate stairs with B handrail with CGA 07/02/22  Goal status: on-going   4.  Demo score of 57% functional status FOTO  Baseline: 54.43%; 57% 07/02/22  Goal status: MET       ASSESSMENT:   CLINICAL IMPRESSION:  Treatment focus on weight shifting and managing momentum with direction change to enhance postural stability with emphasis on lateral and ant-posterior movements to perform stepping outside BOS and return to midline.  Difficulty with maintaining balance when performing lateral movement and reaching outside BOS requiring external support 80% of the time to prevent anterior LOB. Continued sessions to enhance motor control, balance, and LE strength to improve independence and safety with transfers and gait.  Demonstrating supervision ambulation on level surfaces using RW   OBJECTIVE IMPAIRMENTS Abnormal gait, decreased activity tolerance, decreased balance, decreased coordination, decreased endurance, decreased knowledge of use of DME, difficulty walking, decreased strength, impaired perceived functional ability, and improper body mechanics.    ACTIVITY LIMITATIONS carrying, lifting, bending, standing, squatting, stairs, transfers, and locomotion level   PARTICIPATION LIMITATIONS: meal prep, cleaning, laundry, driving, shopping, community activity, and yard work   PERSONAL FACTORS Age, Fitness, and Time since onset of injury/illness/exacerbation are also affecting patient's functional outcome.    REHAB POTENTIAL: Excellent   CLINICAL DECISION MAKING: Evolving/moderate complexity   EVALUATION COMPLEXITY:  Moderate   PLAN: PT FREQUENCY: 2x/week   PT DURATION: 6 weeks   PLANNED INTERVENTIONS: Therapeutic exercises, Therapeutic activity, Neuromuscular re-education, Balance training, Gait training, Patient/Family education, Joint mobilization, Stair training, Vestibular training, Canalith repositioning, DME instructions, Aquatic Therapy, Electrical stimulation, Wheelchair mobility training, and Manual therapy   PLAN FOR NEXT SESSION:  balance reactions and balance on uneven surfaces; training with gait in/out of clinic to negotiate curbs and sidewalk and ability to enter clinic unassisted by staff. Stair ambulation.                      [facility] at [facility], [address] [place], [place] [address] Phone # [phone] Fax # [phone]

## 2022-07-10 ENCOUNTER — Telehealth: Payer: Self-pay

## 2022-07-10 ENCOUNTER — Ambulatory Visit (HOSPITAL_COMMUNITY)
Admission: RE | Admit: 2022-07-10 | Discharge: 2022-07-10 | Disposition: A | Discharge status: Home / Self Care | Payer: Medicare Other | Source: Ambulatory Visit | Attending: Cardiology | Admitting: Cardiology

## 2022-07-10 DIAGNOSIS — I619 Nontraumatic intracerebral hemorrhage, unspecified: Secondary | ICD-10-CM | POA: Diagnosis present

## 2022-07-10 DIAGNOSIS — I482 Chronic atrial fibrillation, unspecified: Secondary | ICD-10-CM

## 2022-07-10 MED ORDER — IOHEXOL 350 MG/ML SOLN
100.0000 mL | Freq: Once | INTRAVENOUS | Status: AC | PRN
Start: 2022-07-10 — End: 2022-07-10
  Administered 2022-07-10: 100 mL via INTRAVENOUS

## 2022-07-11 ENCOUNTER — Encounter: Payer: Self-pay | Admitting: Physical Therapy

## 2022-07-11 ENCOUNTER — Ambulatory Visit: Payer: Medicare Other | Admitting: Occupational Therapy

## 2022-07-11 ENCOUNTER — Inpatient Hospital Stay: Payer: Medicare Other

## 2022-07-11 ENCOUNTER — Ambulatory Visit: Payer: Medicare Other | Admitting: Physical Therapy

## 2022-07-11 VITALS — BP 139/77 | HR 58

## 2022-07-11 DIAGNOSIS — R2681 Unsteadiness on feet: Secondary | ICD-10-CM

## 2022-07-11 DIAGNOSIS — D461 Refractory anemia with ring sideroblasts: Secondary | ICD-10-CM | POA: Diagnosis not present

## 2022-07-11 DIAGNOSIS — D469 Myelodysplastic syndrome, unspecified: Secondary | ICD-10-CM

## 2022-07-11 DIAGNOSIS — R278 Other lack of coordination: Secondary | ICD-10-CM

## 2022-07-11 DIAGNOSIS — I69254 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: Secondary | ICD-10-CM

## 2022-07-11 DIAGNOSIS — M6281 Muscle weakness (generalized): Secondary | ICD-10-CM

## 2022-07-11 DIAGNOSIS — R262 Difficulty in walking, not elsewhere classified: Secondary | ICD-10-CM

## 2022-07-11 DIAGNOSIS — R2689 Other abnormalities of gait and mobility: Secondary | ICD-10-CM

## 2022-07-11 LAB — HEMOGLOBIN AND HEMATOCRIT, BLOOD
HCT: 27.3 % — ABNORMAL LOW (ref 39.0–52.0)
Hemoglobin: 8.9 g/dL — ABNORMAL LOW (ref 13.0–17.0)

## 2022-07-11 MED ORDER — DARBEPOETIN ALFA 100 MCG/0.5ML IJ SOSY
100.0000 ug | PREFILLED_SYRINGE | Freq: Once | INTRAMUSCULAR | Status: AC
  Administered 2022-07-11: 100 ug via SUBCUTANEOUS
  Filled 2022-07-11: qty 0.5

## 2022-07-11 NOTE — Therapy (Signed)
OUTPATIENT PHYSICAL THERAPY TREATMENT  Patient Name: Vernon [name] MRN: [id number] DOB:06/17/1946, 76 y.o., male Today's Date: 07/11/2022  PCP: Gracelyn Nurse, MD  REFERRING PROVIDER: Gracelyn Nurse, MD       END OF SESSION:   PT End of Session - 07/11/22 1655     Visit Number 18    Number of Visits 27    Date for PT Re-Evaluation 08/13/22    Authorization Type [employer]    Progress Note Due on Visit 19    PT Start Time 1535    PT Stop Time 1615    PT Time Calculation (min) 40 min    Equipment Utilized During Treatment Gait belt    Activity Tolerance Patient tolerated treatment well    Behavior During Therapy WFL for tasks assessed/performed;Impulsive                   Past Medical History:  Diagnosis Date   Anemia    Aortic stenosis    Arthritis    Complication of anesthesia    hard time getting bp up after knee replacement   Diabetes mellitus without complication (HCC)    GERD (gastroesophageal reflux disease)    occ tums prn   History of hiatal hernia    Hypertension    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HCC)    Past Surgical History:  Procedure Laterality Date   CARPAL TUNNEL RELEASE  2012   CRANIOTOMY N/A 02/10/2022   Procedure: SUBOCCIPITAL CRANIECTOMY FOR EVACUATION OF CEREBELLAR HEMATOMA;  Surgeon: Barnett Abu, MD;  Location: [facility];  Service: Neurosurgery;  Laterality: N/A;   JOINT REPLACEMENT Right 2010   SHOULDER ARTHROSCOPY WITH ROTATOR CUFF REPAIR AND OPEN BICEPS TENODESIS Right 11/09/2019   Procedure: RIGHT SHOULDER ARTHROSCOPY WITH SUBSCAPULARIS REPAIR, SUBACROMIAL DECOMPRESSION,MINI OPEN ROTATOR CUFF REPAIR;  Surgeon: Signa Kell, MD;  Location: [facility];  Service: Orthopedics;  Laterality: Right;   Patient Active Problem List   Diagnosis Date Noted   Proteinuria, unspecified 05/23/2022   Simple renal cyst 05/23/2022   Long term current use of anticoagulant 04/04/2022   Rotator cuff syndrome 04/04/2022   Exposure to potentially  hazardous substance 04/04/2022   Gout 04/04/2022   Osteoarthritis 04/04/2022   Osteoporosis 04/04/2022   Other specified diseases of hair and hair follicles 04/04/2022   Pain in joint, lower leg 04/04/2022   Problem related to unspecified psychosocial circumstances 04/04/2022   General medical examination for administrative purposes 04/04/2022   Stage 3b chronic kidney disease (HCC)    Dysphagia, post-stroke    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   Cough 02/20/2022   Acute metabolic encephalopathy 02/20/2022   Obstructive hydrocephalus (HCC) 02/19/2022   Dyslipidemia 02/19/2022   Dysphagia 02/19/2022   AKI (acute kidney injury) (HCC)    Hyperkalemia    Anemia    Pleural effusion on right    Respiratory failure requiring intubation (HCC)    Cerebral edema (HCC)    Chronic atrial fibrillation (HCC)    Controlled type 2 diabetes mellitus with hyperglycemia, without long-term current use of insulin (HCC)    ICH (intracerebral hemorrhage) (HCC) 02/10/2022   Intracranial hemorrhage (HCC)    Anticoagulated    Moderate tricuspid regurgitation 07/26/2021   Moderate aortic valve stenosis 03/08/2020   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HCC) 02/18/2020   Macrocytic anemia 01/13/2020   Spleen enlargement 01/13/2020   Bilateral carotid artery stenosis 07/14/2018   Atrial fibrillation, chronic (HCC) 07/14/2018   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  Essential hypertension 08/16/2014   Hyperlipemia, mixed 08/16/2014   Renal insufficiency 08/16/2014    REFERRING DIAG: I62.9 (ICD-10-CM) - Nontraumatic intracranial hemorrhage, unspecified   THERAPY DIAG:  Unsteadiness on feet  Other abnormalities of gait and mobility  Muscle weakness (generalized)  Rationale for Evaluation and Treatment Rehabilitation  PERTINENT HISTORY: see MAR  PRECAUTIONS: fall risk  SUBJECTIVE: Had a fall yesterday, at my wife's office, transitioning from carpet to hardwood, using  the rolling walker.  Sped up and fell towards the left.  Wife tried to help, but wasn't on the left side.  Was able to get up using hands on a chair, by myself.  Worried a bit about the upcoming watchman procedure on 8/31, if all checks out okay.   PAIN:  Are you having pain? Yes: NPRS scale: 1-2, up to 5/10 Pain location: L shoulder and arm Pain description: sore Aggravating factors: fall yesterday Relieving factors: rest  No pain reported in L knee today   OBJECTIVE:     TODAY'S TREATMENT: 07/11/2022 Activity Comments  NuStep, 4 extremities, Level 5, x 6 minutes, for lower extremity strengthening Cues for light use of UEs and attention to increased use of LLE  Lateral step and weightshift, over hurdle, x 10 reps Mirror for visual cues, VCs for increased weightshift towards L side  Forward step and weightshift over hurdle, x 10 reps Mirror for visual cues, VCs for hip/knee flexion for foot clearance (not circumduction) and cues for widened BOS  SLS with forward/back rolling soccer ball, x 10 reps UE support, tactile cues at gluts for upright posture  Using black wedge obstacles at midline in parallel bars, with mirror cues, forward/back walking to facilitate widened BOS, x 4 reps, then with wedge removed, x 2 reps, able to keep BOS wide Min assist and cues for weightshift laterally and for upright posture; used cue throughout standing activities:  "belly button forward"  Short distance gait between activities, initial cues for upright posture, using RW Min assist  Gait with large figure-8 turns around furniture, with RW Min assist and cues for pause to reset, when pt begins to increase pace.      HOME EXERCISE PROGRAM Last updated: 07/03/22 Access Code: [email or web address] URL: [email or web address] Date: 07/02/2022 Prepared by: [facility]  Exercises - Seated Hip Abduction  - 1 x daily - 5 x weekly - 2 sets - 10 reps - Seated Long Arc  Quad  - 1 x daily - 5 x weekly - 2 sets - 10 reps - Sit to Stand with Counter Support  - 1 x daily - 5 x weekly - 2 sets - 10 reps - Standing Hip Abduction with Counter Support  - 1 x daily - 5 x weekly - 2 sets - 10 reps - Standing Alternating Knee Flexion with Ankle Weights  - 1 x daily - 5 x weekly - 2 sets - 10 reps   -------------------------------------------------------------------------------------------------------------------------   From eval  DIAGNOSTIC FINDINGS: see MAR   COGNITION: Overall cognitive status: Within functional limits for tasks assessed             SENSATION: WFL   COORDINATION: Bradykinesia, difficulty wit rapid alternating movements     MUSCLE TONE: WFL       POSTURE: No Significant postural limitations   LOWER EXTREMITY ROM:      WFL  (Blank rows = not tested)   LOWER EXTREMITY MMT:     MMT Right Eval Left Eval  Hip flexion 5 4  Hip extension 5 3+  Hip abduction 5 3-  Hip adduction      Hip internal rotation      Hip external rotation      Knee flexion 5 4  Knee extension 5 4  Ankle dorsiflexion 5 4+  Ankle plantarflexion      Ankle inversion      Ankle eversion      (Blank rows = not tested)   BED MOBILITY:  Independent   TRANSFERS: Assistive device utilized: Environmental consultant - 2 wheeled  Sit to stand: Modified independence Stand to sit: Modified independence Chair to chair: SBA and CGA Floor:  DNT   RAMP:  Not available   CURB:  Level of Assistance: SBA and CGA Assistive device utilized: Environmental consultant - 2 wheeled Curb Comments: undershoots with LLE   STAIRS:           Level of Assistance: SBA and CGA           Stair Negotiation Technique: Alternating Pattern  with Bilateral Rails           Number of Stairs: 6             Height of Stairs: 4-6"            Comments: dysmetria w/ LLE   GAIT: Gait pattern: circumduction- Left and ataxic Distance walked: 100 Assistive device utilized: Walker - 2 wheeled Level of assistance:  SBA Comments: dysmetria LLE   FUNCTIONAL TESTs:  Timed up and go (TUG): 20.25 with RW Berg Balance Scale: 35/56      M-CTSIB  Condition 1: Firm Surface, EO 30 Sec, Mild Sway  Condition 2: Firm Surface, EC 30 Sec, Mild and Moderate Sway  Condition 3: Foam Surface, EO 9 Sec, Severe Sway  Condition 4: Foam Surface, EC 0 Sec,  unable  Sway     07/02/22 M-CTSIB  Condition 1: Firm Surface, EO 30 Sec, Mild and Moderate Sway  Condition 2: Firm Surface, EC 30 Sec, Moderate Sway * required RW support to get into romberg  Condition 3: Foam Surface, EO 30 Sec, Severe Sway * required RW support to get into romberg  Condition 4: Foam Surface, EC NT Sec, NT Sway          PATIENT SURVEYS:  FOTO 54.43% functional status   TODAY'S TREATMENT:  Evaluation, sidestepping along counter, tandem stance at counter     PATIENT EDUCATION: Education details: scope of PT intervention Person educated: Patient and Spouse Education method: Explanation Education comprehension: verbalized understanding     HOME EXERCISE PROGRAM: sidestepping along counter, tandem stance at counter   -----------------------------------------------------------------------------------------------------------------------    GOALS: Goals reviewed with patient? Yes   SHORT TERM GOALS: Target date: 05/29/2022   Patient will perform HEP with family/caregiver supervision for improved strength, balance, transfers, and gait  Baseline: Goal status: MET   2.  Patient will achieve 15 seconds for TUG test to manifest reduced risk for falls Baseline: 20.25 with RW; 14 sec with RW Goal status: MET   3. Demo modified independent gait level surfaces and curb negotiation            Baseline: CGA-SBA with RW; unchanged 07/02/22            Goal status: On-going   LONG TERM GOALS: Target date: 08/13/2022   Patient will demonstrate score 45/56 Berg Balance Test to manifest low risk for falls Baseline: 35/56; 37/56 on 06/06/22;  37/56 07/02/22 Goal status: On-going  2.  Demonstrate improved static balance and postural control per time of 15 sec condition 4 M-CTSIB to improve safety with mobility on uneven surfaces Baseline: complete LOB left with condition 3; NT d/t safety 07/02/22 Goal status: On-going   3.  Demo modified independent gait using least restrictive AD over various surfaces and ascend/descend stairs with set-up assist to improve functional mobility Baseline: able to navigate stairs with B handrail with CGA 07/02/22  Goal status: on-going   4.  Demo score of 57% functional status FOTO  Baseline: 54.43%; 57% 07/02/22  Goal status: MET       ASSESSMENT:   CLINICAL IMPRESSION: Skilled PT session focused on weightshifting, stepping exercises and widened BOS with gait.  Utilized visual cues of mirror and UE support at parallel bars to improve stability throughout standing exercises.   Transitioned at end of session to gait with RW, to work on carryover of principles used in parallel bars (wide BOS, increased weightshift/stance time, and upright posture), with pt requiring min assist and cues to reset posture and to slow pace.  Pt had fall yesterday, using RW, losing balance to L and falling on shoulder.  He does not have any additional c/o and was able to get up from the floor with UE support. He will continue to benefit from skilled PT towards goals for improved overall functional mobility and decreased fall risk.   OBJECTIVE IMPAIRMENTS Abnormal gait, decreased activity tolerance, decreased balance, decreased coordination, decreased endurance, decreased knowledge of use of DME, difficulty walking, decreased strength, impaired perceived functional ability, and improper body mechanics.    ACTIVITY LIMITATIONS carrying, lifting, bending, standing, squatting, stairs, transfers, and locomotion level   PARTICIPATION LIMITATIONS: meal prep, cleaning, laundry, driving, shopping, community activity, and yard  work   PERSONAL FACTORS Age, Fitness, and Time since onset of injury/illness/exacerbation are also affecting patient's functional outcome.    REHAB POTENTIAL: Excellent   CLINICAL DECISION MAKING: Evolving/moderate complexity   EVALUATION COMPLEXITY: Moderate   PLAN: PT FREQUENCY: 2x/week   PT DURATION: 6 weeks   PLANNED INTERVENTIONS: Therapeutic exercises, Therapeutic activity, Neuromuscular re-education, Balance training, Gait training, Patient/Family education, Joint mobilization, Stair training, Vestibular training, Canalith repositioning, DME instructions, Aquatic Therapy, Electrical stimulation, Wheelchair mobility training, and Manual therapy   PLAN FOR NEXT SESSION:  *PROGRESS NOTE due at VISIT 66; Work on weigthshifting, step strategy, reaching out of BOS; work on gait, turns, balance reactions and balance on uneven surfaces; Stair ambulation.                    Mady Haagensen, PT 07/11/22 4:57 PM Phone: [phone] Fax: [phone]   [facility] at [facility], [place], [place] [address] Phone # [phone] Fax # [phone]

## 2022-07-11 NOTE — Therapy (Signed)
OUTPATIENT OCCUPATIONAL THERAPY Treatment Note   Patient Name: Vernon [name] MRN: [id number] DOB:1946-05-06, 76 y.o., male Today's Date: 07/12/2022  PCP: Baxter Hire, MD REFERRING PROVIDER: Baxter Hire, MD      OT End of Session - 07/11/22 1500     Visit Number 16    Number of Visits 25    Date for OT Re-Evaluation 08/10/22    Authorization Type [employer] Medicare    Authorization Time Period VL: [place]    Progress Note Due on Visit 20    OT Start Time 1451    OT Stop Time 1531    OT Time Calculation (min) 40 min    Activity Tolerance Patient tolerated treatment well    Behavior During Therapy WFL for tasks assessed/performed                    Past Medical History:  Diagnosis Date   Anemia    Aortic stenosis    Arthritis    Complication of anesthesia    hard time getting bp up after knee replacement   Diabetes mellitus without complication (HCC)    GERD (gastroesophageal reflux disease)    occ tums prn   History of hiatal hernia    Hypertension    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[place])    Past Surgical History:  Procedure Laterality Date   CARPAL TUNNEL RELEASE  2012   CRANIOTOMY N/A 02/10/2022   Procedure: SUBOCCIPITAL CRANIECTOMY FOR EVACUATION OF CEREBELLAR HEMATOMA;  Surgeon: Kristeen Miss, MD;  Location: [facility];  Service: Neurosurgery;  Laterality: N/A;   JOINT REPLACEMENT Right 2010   SHOULDER ARTHROSCOPY WITH ROTATOR CUFF REPAIR AND OPEN BICEPS TENODESIS Right 11/09/2019   Procedure: RIGHT SHOULDER ARTHROSCOPY WITH SUBSCAPULARIS REPAIR, SUBACROMIAL DECOMPRESSION,MINI OPEN ROTATOR CUFF REPAIR;  Surgeon: Leim Fabry, MD;  Location: [facility];  Service: Orthopedics;  Laterality: Right;   Patient Active Problem List   Diagnosis Date Noted   Proteinuria, unspecified 05/23/2022   Simple renal cyst 05/23/2022   Long term current use of anticoagulant 04/04/2022   Rotator cuff syndrome 04/04/2022   Exposure to potentially hazardous substance 04/04/2022    Gout 04/04/2022   Osteoarthritis 04/04/2022   Osteoporosis 04/04/2022   Other specified diseases of hair and hair follicles 46/50/3546   Pain in joint, lower leg 04/04/2022   Problem related to unspecified psychosocial circumstances 04/04/2022   General medical examination for administrative purposes 04/04/2022   Stage 3b chronic kidney disease ([place])    Dysphagia, post-stroke    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 hemorrhage of brain ([place]) 02/26/2022   Cough 56/81/2751   Acute metabolic encephalopathy 70/11/7492   Obstructive hydrocephalus ([place]) 02/19/2022   Dyslipidemia 02/19/2022   Dysphagia 02/19/2022   AKI (acute kidney injury) ([place])    Hyperkalemia    Anemia    Pleural effusion on right    Respiratory failure requiring intubation (HCC)    Cerebral edema (HCC)    Chronic atrial fibrillation ([place])    Controlled type 2 diabetes mellitus with hyperglycemia, without long-term current use of insulin (HCC)    ICH (intracerebral hemorrhage) ([place]) 02/10/2022   Intracranial hemorrhage (HCC)    Anticoagulated    Moderate tricuspid regurgitation 07/26/2021   Moderate aortic valve stenosis 03/08/2020   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[place]) 02/18/2020   Macrocytic anemia 01/13/2020   Spleen enlargement 01/13/2020   Bilateral carotid artery stenosis 07/14/2018   Atrial fibrillation, chronic ([place]) 07/14/2018   Type 2 diabetes with nephropathy ([place]) 02/14/2016   Essential hypertension 08/16/2014  Hyperlipemia, mixed 08/16/2014   Renal insufficiency 08/16/2014    ONSET DATE: 02/10/2022  REFERRING DIAG: I62.9 (ICD-10-CM) - Nontraumatic intracranial hemorrhage, unspecified  THERAPY DIAG:  Unsteadiness on feet - Plan: Ot plan of care cert/re-cert  Muscle weakness (generalized) - Plan: Ot plan of care cert/re-cert  Difficulty in walking, not elsewhere classified - Plan: Ot plan of care cert/re-cert  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  He states he was walking out of the bathroom and when the flooring changed to hard wood he started going faster and then fell.  Pt's spouse was with him/behind him but was unable to catch him and he fell to the floor.  He fell on L side, arm, and shoulder.    Pt accompanied by: self  PERTINENT HISTORY: cerebellar hemorrhage w/ shift and early hydrocephalus; emergent decompression surgery w/ Dr. Ellene Route on 02/11/22 w/ EVD pulled 02/14/22. PMH includes DMT2, HTN, HLD, atrial fibrillation on Eliquis, arthritis, anemia, and GERD  PAIN: Are you having pain? Yes.  Pt reports pain 5/10 at rest, but increases quickly with movement.  Aching, L arm and shoulder.    PATIENT GOALS: Be independent again; play guitar  OBJECTIVE:   TODAY'S TREATMENT:  Engaged in towel slides with focus on motor control and increasing shoulder ROM within pain tolerance.  Pt continues to benefit from closed chain and visual attention to task to facilitate improved motor control. Engaged in weighted ROM exercises with focus on motor control and strengthening.   Scapular retraction body weight x10, progressing to with 1# dumbbells 2x10.  Therapist providing cues for motor control and technique. External rotation with 1# x10.  Therapist providing towel roll along torso and under arm to allow for feedback for improved technique.  Increased to 3# x10. Shoulder flexion with 1# x10 Increased to 3# x10. Seated book opening stretch with 3# weight.  Therapist providing tactile cues and facilitation for increased positioning and technique as pt demonstrating shoulder hike on L and decreased symmetry on R. 2x10. Pt required rest breaks between each set due to pain  s/p fall yesterday, however pt motivated to continue to work on UE.    PATIENT EDUCATION: Ongoing condition-specific education, particularly regarding neuro reed and typical recovery patterns as well as benefit and goal of weight bearing/increased somatosensory input through affected UE Person educated: Patient Education method: Explanation Education comprehension: verbalized understanding   HOME EXERCISE PROGRAM: Coordination handout - see pt instructions  Access Code: [email or web address] URL: [email or web address] Date: 06/04/2022 Prepared by: [facility]    GOALS: Goals reviewed with patient? Yes  SHORT TERM GOALS: Target date: 06/08/22  STG  Status:  1 Pt will demonstrate independence w/ initial HEP designed for LUE GM and [place] Baseline: No HEP at this time Progressing  2 Pt will improve participation in functional bilateral FM tasks as evidenced by decreasing time to complete 9-Hole Peg Test w/ LUE by at least 6 sec Baseline: Right: 21.8  sec; Left: 51.2 sec Progressing -  L: 1:05.35 on 07/04/22  3 Pt will be able to complete simulated bilateral functional task (e.g. cutting food, manipulating clothing fasteners, washing dishes) within reasonable amount of time Baseline: Difficulty w/ bilateral  tasks Met - pt reports "I have to be deliberate, but I can get it done"     LONG TERM GOALS: Target date: 07/06/22  LTG  Status:  1 Pt will improve participation in functional activities as evidenced by increasing FOTO score to 71 by d/c Baseline: 62 Progressing - score 61 on 07/04/22  2 Pt will demonstrate improved control and accuracy w/ LUE by completing 8 taps within 10 seconds during finger to nose test Baseline: Right: 12 taps in 10 sec; Left: 6 taps in 10 sec Met - 8 taps in 10 seconds (while stabilizing elbow at side)  3 Pt will improve Box and Blocks score to at least 40 blocks by d/c to indicate improved unilateral GMC of LUE Baseline:  Right 61 blocks, Left 29 blocks Progressing - 07/04/22 - 36 blocks  4 Pt will safely demonstrate simulated grilling activity, incorporating compensatory strategies/AE prn, w/ Mod I by d/c Baseline: Unable to complete at this time Progressing - have not be walking outside     Target Date: 08/10/22 LTG  Status:  1 Pt will improve participation in functional activities as evidenced by increasing FOTO score to 71 by d/c Baseline: 62 Progressing - score 61 on 07/04/22  2 Pt will improve Box and Blocks score to at least 40 blocks by d/c to indicate improved unilateral GMC of LUE Baseline: Right 61 blocks, Left 29 blocks Progressing - 07/04/22 - 36 blocks  3 Pt will safely demonstrate simulated grilling activity, incorporating compensatory strategies/AE prn, w/ Mod I by d/c Baseline: Unable to complete at this time Progressing - have not be walking outside  4 Pt will verbalize understanding/awareness of modifications and strategies to minimize effects of ataxia on ADLs, IADLs, and leisure tasks.   Baseline:  INITIAL  5 Pt will demonstrate improved motor control of LUE during functional reach to place light-mod weight items on shelf at shoulder height. Baseline: INITIAL    ASSESSMENT:  CLINICAL IMPRESSION: Treatment session with focus on closed and open chain movements and use of weight with focus on improved motor control.   Pt had a fall yesterday where he fell on L side and reports mod pain in LUE and shoulder, therefore limited movements within pain tolerance.  Pt demonstrating good tolerance to incorporation of weight during exercises this session.  Pt continues to benefit from intermittent tactile cues for improved posture and positioning during exercises.  Pt states that he feels his hand has come under some control, but still difficult with the arm control,especially leisure task of playing guitar.  Pt reports improvements with focus on "slow and deliberate" movements.  Pt demonstrates decreased  impact of ataxia when able to increase proprioceptive input through UE by either closed chain on surface or stabilizing elbow along torso.  Pt continues to demonstrate ataxia with both gross and fine motor tasks, benefiting from verbal cues for pacing, as decreased speed facilitates increased accuracy and success. Pt will continue to benefit from activities with focus on body alignment, WB and closed progressing to open chain activities to incorporate into functional tasks to decrease impact of ataxia on functional reach.  PERFORMANCE DEFICITS in functional skills including ADLs, IADLs, coordination, dexterity, sensation, strength, FMC, GMC, mobility, balance, body mechanics, and UE functional use.  IMPAIRMENTS are limiting patient from ADLs, IADLs, and leisure.   COMORBIDITIES has co-morbidities such as atrial fibrillation on Eliquis and DMT2  that affects occupational performance. Patient will benefit from skilled OT to address above impairments and improve overall  function.  MODIFICATION OR ASSISTANCE TO COMPLETE EVALUATION: Min-Moderate modification of tasks or assist with assess necessary to complete an evaluation.  OT OCCUPATIONAL PROFILE AND HISTORY: Detailed assessment: Review of records and additional review of physical, cognitive, psychosocial history related to current functional performance.  CLINICAL DECISION MAKING: Moderate - several treatment options, min-mod task modification necessary  REHAB POTENTIAL: Good  EVALUATION COMPLEXITY: Moderate   PLAN: OT FREQUENCY: 2x/week  OT DURATION: 4 weeks  PLANNED INTERVENTIONS: self care/ADL training, therapeutic exercise, therapeutic activity, neuromuscular re-education, manual therapy, functional mobility training, aquatic therapy, electrical stimulation, ultrasound, biofeedback, moist heat, cryotherapy, patient/family education, energy conservation, and DME and/or AE instructions  RECOMMENDED OTHER SERVICES: To receive PT/ST services  at this location  CONSULTED AND AGREED WITH PLAN OF CARE: Patient and family member/caregiver  PLAN FOR NEXT SESSION:   GMC/FMC activities for LUE. Focus on postural control to address ataxia.  Discuss possibility of compression garments to decrease ataxia. Seated vs standing weight-bearing, closed-chain exercise, coordination activities, etc.  Functional in nature, if possible, to carry over to pt desire to play guitar again. Assess goals and re-cert if recommended.   Simonne Come, OTR/L 07/12/2022, 9:35 AM

## 2022-07-16 ENCOUNTER — Ambulatory Visit: Payer: Medicare Other | Admitting: Physical Therapy

## 2022-07-16 ENCOUNTER — Ambulatory Visit: Payer: Medicare Other

## 2022-07-16 ENCOUNTER — Other Ambulatory Visit: Payer: Self-pay

## 2022-07-16 ENCOUNTER — Telehealth: Payer: Self-pay

## 2022-07-16 ENCOUNTER — Other Ambulatory Visit: Payer: Self-pay | Admitting: Nephrology

## 2022-07-16 ENCOUNTER — Encounter: Payer: Self-pay | Admitting: Physical Therapy

## 2022-07-16 DIAGNOSIS — I482 Chronic atrial fibrillation, unspecified: Secondary | ICD-10-CM

## 2022-07-16 DIAGNOSIS — R471 Dysarthria and anarthria: Secondary | ICD-10-CM

## 2022-07-16 DIAGNOSIS — N1831 Chronic kidney disease, stage 3a: Secondary | ICD-10-CM

## 2022-07-16 DIAGNOSIS — R41841 Cognitive communication deficit: Secondary | ICD-10-CM

## 2022-07-16 DIAGNOSIS — D631 Anemia in chronic kidney disease: Secondary | ICD-10-CM

## 2022-07-16 DIAGNOSIS — E1122 Type 2 diabetes mellitus with diabetic chronic kidney disease: Secondary | ICD-10-CM

## 2022-07-16 DIAGNOSIS — R809 Proteinuria, unspecified: Secondary | ICD-10-CM

## 2022-07-16 DIAGNOSIS — R262 Difficulty in walking, not elsewhere classified: Secondary | ICD-10-CM

## 2022-07-16 DIAGNOSIS — N281 Cyst of kidney, acquired: Secondary | ICD-10-CM

## 2022-07-16 DIAGNOSIS — D461 Refractory anemia with ring sideroblasts: Secondary | ICD-10-CM | POA: Diagnosis not present

## 2022-07-16 DIAGNOSIS — M6281 Muscle weakness (generalized): Secondary | ICD-10-CM

## 2022-07-16 DIAGNOSIS — R2681 Unsteadiness on feet: Secondary | ICD-10-CM

## 2022-07-16 MED ORDER — SODIUM CHLORIDE 0.9 % IV SOLN: INTRAVENOUS | Status: DC

## 2022-07-16 NOTE — Patient Instructions (Addendum)
     Please change the "ahhhhh" routine to (instead of 10 high-pitched "ahhhh": 5 "ahhhhhhhhhhh" with high pitch,  then 5 at a normal (speaking) pitch,  then 2-3 more at a high pitch.  Stop the "ahhhhh" when you feel like you need another breath, even if it's before 10 seconds.      Vernon [name] knew he had an appointment with the ENT at [facility] but was unsure of the MD, and of the date.  I will pause ST after next week, until the ENT eval. Yall continue to do the exericses until the ENT visit. After the ENT eval, I'd like to see him back for ST, once a week x2 weeks.  Please have the ENT fax his report to me. [phone], attn: Garald Balding, SLP

## 2022-07-16 NOTE — Therapy (Signed)
OUTPATIENT SPEECH LANGUAGE PATHOLOGY TREATMENT NOTE   Patient Name: Vernon [name] MRN: [id number] DOB:11-17-1946, 76 y.o., male Today's Date: 07/17/2022  PCP: Vernon Hire, MD  REFERRING PROVIDER: Baxter Hire, MD   END OF SESSION:   End of Session - 07/17/22 1054     Visit Number 16    Number of Visits 25    Date for SLP Re-Evaluation 07/26/22    Authorization Type [employer]    SLP Start Time 1532    SLP Stop Time  1610    SLP Time Calculation (min) 38 min    Activity Tolerance Patient tolerated treatment well                     Past Medical History:  Diagnosis Date   Anemia    Aortic stenosis    Arthritis    Complication of anesthesia    hard time getting bp up after knee replacement   Diabetes mellitus without complication (HCC)    GERD (gastroesophageal reflux disease)    occ tums prn   History of hiatal hernia    Hypertension    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[place])    Past Surgical History:  Procedure Laterality Date   CARPAL TUNNEL RELEASE  2012   CRANIOTOMY N/A 02/10/2022   Procedure: SUBOCCIPITAL CRANIECTOMY FOR EVACUATION OF CEREBELLAR HEMATOMA;  Surgeon: Vernon Miss, MD;  Location: [facility];  Service: Neurosurgery;  Laterality: N/A;   JOINT REPLACEMENT Right 2010   SHOULDER ARTHROSCOPY WITH ROTATOR CUFF REPAIR AND OPEN BICEPS TENODESIS Right 11/09/2019   Procedure: RIGHT SHOULDER ARTHROSCOPY WITH SUBSCAPULARIS REPAIR, SUBACROMIAL DECOMPRESSION,MINI OPEN ROTATOR CUFF REPAIR;  Surgeon: Vernon Fabry, MD;  Location: [facility];  Service: Orthopedics;  Laterality: Right;   Patient Active Problem List   Diagnosis Date Noted   Proteinuria, unspecified 05/23/2022   Simple renal cyst 05/23/2022   Long term current use of anticoagulant 04/04/2022   Rotator cuff syndrome 04/04/2022   Exposure to potentially hazardous substance 04/04/2022   Gout 04/04/2022   Osteoarthritis 04/04/2022   Osteoporosis 04/04/2022   Other specified diseases of hair and  hair follicles 00/92/3300   Pain in joint, lower leg 04/04/2022   Problem related to unspecified psychosocial circumstances 04/04/2022   General medical examination for administrative purposes 04/04/2022   Stage 3b chronic kidney disease ([place])    Dysphagia, post-stroke    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 hemorrhage of brain ([place]) 02/26/2022   Cough 76/22/6333   Acute metabolic encephalopathy 54/56/2563   Obstructive hydrocephalus ([place]) 02/19/2022   Dyslipidemia 02/19/2022   Dysphagia 02/19/2022   AKI (acute kidney injury) ([place])    Hyperkalemia    Anemia    Pleural effusion on right    Respiratory failure requiring intubation (HCC)    Cerebral edema (HCC)    Chronic atrial fibrillation ([place])    Controlled type 2 diabetes mellitus with hyperglycemia, without long-term current use of insulin (HCC)    ICH (intracerebral hemorrhage) ([place]) 02/10/2022   Intracranial hemorrhage (HCC)    Anticoagulated    Moderate tricuspid regurgitation 07/26/2021   Moderate aortic valve stenosis 03/08/2020   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[place]) 02/18/2020   Macrocytic anemia 01/13/2020   Spleen enlargement 01/13/2020   Bilateral carotid artery stenosis 07/14/2018   Atrial fibrillation, chronic ([place]) 07/14/2018   Type 2 diabetes with nephropathy ([place]) 02/14/2016   Essential hypertension 08/16/2014   Hyperlipemia, mixed 08/16/2014   Renal insufficiency 08/16/2014    PERTINENT HISTORY:  March 2023 experienced hemorrhage in cerebellar peduncle and underwent  craniotomy for evacuation. Notes his LUE and LLE are most affected at present. Underwent acute hospitalization, and [facility]. Recently D/C from home health on 04/30/22. Did not receive HHST, only HHPT/OT.     ONSET DATE: 02/10/22   REFERRING DIAG: I62.9 (ICD-10-CM) - Nontraumatic intracranial hemorrhage, unspecified   THERAPY DIAG:  Dysarthria and anarthria  Cognitive communication deficit  Rationale for Evaluation and Treatment  Rehabilitation  SUBJECTIVE: "I actually got authorized for (the Watchman procedure) this morning. Pre-op is Monday, and Thursday is the procedure."  PAIN:  Are you having pain? No   OBJECTIVE:   TODAY'S TREATMENT: 07/16/22: Vernon [name] states ENT appointment has been made but Vernon [name] has not told him details as of currently. Pt performed PHOrTE exercises today with mod IConstance [name] did not demo fatigue with /a/, nor with numbers low-high-low. Sentences, he demo'd fatigue with numbers at 8. SLP shaped pt's /a/ for lower pitch, Pt definitely had more vocal breaks at a normal speaking pitch than /a/ at a high pitch. He req'd min A rarely for loudness and then was mod I. SLP told pt to perform 5 /a/ with high pitch, then 5 at speaking pitch, then 2-3 more /a/ at high pitch this next week.   8/14/23[name] reported the ENT appointment is in process. Questions about his therapy schedule were answered with excellent success. Pt told SLP about upcoming appointments, correctly, with intermittent use of calendar. Vernon [name] performed PHOrTE exercises with modified independence and stated, "I've seen some good progress with my voice." Pt  fatigued following 8 reps of /a/, 7-8 reps of numbers, after 7-8 high-pitched sentences, and did not appear to fatigue with low-pitched sentences. Pt agreed that two more sessions were appropriate, once/week.  07/04/22: Pt is unsure if ENT appointment has been made yet. Today pt answered questions about his schedule with 95% accuracy. He performed his PHOrTE exercises with rare min A. SLP and pt agreed he could decr to once/week. Verbally, pt told me three sequences with WNL organization and memory.  8/7/23[name] entered tx room and correctly told SLP appointments today and next therapy day (Wednesday). He told SLP challenge with checkbook is with legibility due to spaces being too thin so he is using two spaces. SLP agreed with pt with this. He performed PHOrTE HEP with SBA with pitch  variability with numbers. SLP told pt SLP is recommending ENT consult due to voice still not WNL - but it has improved since initial eval. SLP got two names of ENTs in [place] area as pt is from [employer]. SLP provided these names for pt and asked pt to show wife, which SLP learned he did after his last tx session today, when wife arrived.   06/28/22: Pt was late getting into ST today due to aide not comfortable transferring pt/walking pt in to clinic. SLP provided occasional min- mod A faded to rare min A for abdominal breathing with PHOrTE tasks, but other procedural steps were WNL. With calendar tasks, pt tracked appointments WNL, but had mistake that he was unaware of re: end time.  06/25/22: SLP reminded pt that he had difficulty last session with ID that day's therapy sessions, when he returned, etc. SLP asked pt to tell the sessions today which he did correctly. When SLP asked pt to tell the sequence of next appointments, he was incorrect and req'd SLP A for correction. This occurred due to pt taking out the *entire page* of a horizontal bifold calendar to look at what  appointments were for next month- thus pt was looking at mismatched months and req'd correction for error awareness.  Pt tells SLP he maintains BID HEP completion. Pt states he feels stronger voice now with upper and lower pitches and the middle of the range is where he feels the weakest. [name] came in to [place] and she and pt did HEP together. Pt req'd min A rarely for making sure low-hi-low numbers started at lowest pitch, slowing sentence reps, and that he was using abdominal breathing (AB) for all productions. [name] stated pt had difficulty balancing checkbook ("for every 4 right thing sthere would be one wrong one"). SLP explained Mabry is having more difficulty with error awareness and attention to details at this time and that was another example (first being calendar task today).   06/21/22: "I have to get a CT and then  they will tell me what the plan is (for cardiac sx)." Pt with WNL voicing for intermittent segments of 20-30 seconds during ST today, x9. SLP had pt perform the HEP (PHoRTE) and pt req'd rare min A; usually SLP needing to provide more intense cueing than that level - SLP attributes this to pt performing with wife correctly, and consistently. Pt req'd mod-max A tfor appointment tracking - told SLP that  Monday's appointments were today's and they were wrong on the sheet. When SLP asked pt for the date today, pt looked at SLP calendar and then pt looked elsewhere for today's date and then realized he was telling me appointments for next week and not for today.  Pt repeated story to SLP he told earlier in session x1.       PATIENT EDUCATION: Education details: See "treatment" above Person educated: Patient Education method: Explanation, demo, Verbal cues Education comprehension: verbalized understanding, returned demonstration, verbal cues required, and needs further education      GOALS: Goals reviewed with patient? Yes   SHORT TERM GOALS: Target date: 05/31/22 Pt will complete HEP for voice/dysarthria with occasional min A over 2 sessions Baseline: 05/24/22 Goal status: Met   2.  Pt will use external aids to recall daily events/schedule with occasional min A from spouse over 1 week Baseline:  Goal status: Deferred   3.  Pt will achieve clear phonation 18/20 sentences in structured task Baseline:  Goal status: Not met   4.  Pt will ID dysnomic errors and self correct with rare min A over 2 sessions Baseline:  Goal status: Deferred     LONG TERM GOALS: Target date: 07/26/22 Pt will achieve clear phonation over 10 minute conversation with rare min A over 2 sessions Baseline:  Goal status: Revised   2.  Pt will independently recall weekly/daily schedule, appointments events and pertinent information with memory aids over 2 sessions Baseline: 07-02-22 Goal status: Met   3.  Pt will  complete vocal warm ups and follow 3 vocal hygiene strategies to return to singing for 5-10 minute periods with rare min A Baseline:  Goal status: Ongoing   4.  Improve score on Cognitive function PROM by 4 points Baseline: 101- filled out by spouse Goal status: Ongoing   ASSESSMENT:   CLINICAL IMPRESSION: Patient is a 76 y.o. male who is seen for dysarthria/voice and cognitive linguistics s/p CVA. See tx note. Pt has ENT appointment but did not know exact date. His Watchman device will be placed 07/26/22, with pre-op on Monday 07/23/22. Pt did not demo difficulty with memory for appointments, nor with verbal sequencing - appropriate  with organized sequential organization. 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 I recommend cont'd skilled ST to maximize voice and cognition for safety, to reduce caregiver burden and QOL. Vernon [name] and SLP agreed pt oculd reduce to once/week. AFter next session SLP may put pt on hold until after ENT eval, with Vernon [name] and Vernon [name] continuing with PHOrTE until ENT eval.   OBJECTIVE IMPAIRMENTS include attention, memory, awareness, dysarthria, and voice disorder. These impairments are limiting patient from managing appointments, household responsibilities, and effectively communicating at home and in community. Factors affecting potential to achieve goals and functional outcome are ability to learn/carryover information.. Patient will benefit from skilled SLP services to address above impairments and improve overall function.   REHAB POTENTIAL: Good   PLAN: SLP FREQUENCY: 1x/week   SLP DURATION: 12 weeks   PLANNED INTERVENTIONS: Language facilitation, Environmental controls, Cueing hierachy, Cognitive reorganization, Internal/external aids, Functional tasks, Multimodal communication approach, and SLP instruction and feedback         [place], [place] 07/17/2022, 10:55 AM

## 2022-07-16 NOTE — Telephone Encounter (Signed)
-----   Message from Vickie Epley, MD sent at 07/14/2022  2:10 PM EDT ----- OK to proceed with Watchman evaluation.  Lysbeth Galas T. Quentin Ore, MD, [facility] [address], [facility] Cardiac Electrophysiology

## 2022-07-16 NOTE — Therapy (Unsigned)
OUTPATIENT PHYSICAL THERAPY TREATMENT/PROGRESS NOTE  Patient Name: Vernon [name] MRN: [id number] DOB:02/08/1946, 76 y.o., male Today's Date: 07/16/2022  PCP: Gracelyn Nurse, MD  REFERRING PROVIDER: Gracelyn Nurse, MD    Progress Note Reporting Period 06/06/2022 to 07/16/2022  See note below for Objective Data and Assessment of Progress/Goals.       END OF SESSION:   PT End of Session - 07/16/22 1445     Visit Number 19    Number of Visits 27    Date for PT Re-Evaluation 08/13/22    Authorization Type [employer]    Progress Note Due on Visit 29    PT Start Time 1445    PT Stop Time 1530    PT Time Calculation (min) 45 min    Equipment Utilized During Treatment Gait belt    Activity Tolerance Patient tolerated treatment well    Behavior During Therapy WFL for tasks assessed/performed;Impulsive                    Past Medical History:  Diagnosis Date   Anemia    Aortic stenosis    Arthritis    Complication of anesthesia    hard time getting bp up after knee replacement   Diabetes mellitus without complication (HCC)    GERD (gastroesophageal reflux disease)    occ tums prn   History of hiatal hernia    Hypertension    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HCC)    Past Surgical History:  Procedure Laterality Date   CARPAL TUNNEL RELEASE  2012   CRANIOTOMY N/A 02/10/2022   Procedure: SUBOCCIPITAL CRANIECTOMY FOR EVACUATION OF CEREBELLAR HEMATOMA;  Surgeon: Barnett Abu, MD;  Location: [facility] OR;  Service: Neurosurgery;  Laterality: N/A;   JOINT REPLACEMENT Right 2010   SHOULDER ARTHROSCOPY WITH ROTATOR CUFF REPAIR AND OPEN BICEPS TENODESIS Right 11/09/2019   Procedure: RIGHT SHOULDER ARTHROSCOPY WITH SUBSCAPULARIS REPAIR, SUBACROMIAL DECOMPRESSION,MINI OPEN ROTATOR CUFF REPAIR;  Surgeon: Signa Kell, MD;  Location: [facility];  Service: Orthopedics;  Laterality: Right;   Patient Active Problem List   Diagnosis Date Noted   Proteinuria, unspecified 05/23/2022    Simple renal cyst 05/23/2022   Long term current use of anticoagulant 04/04/2022   Rotator cuff syndrome 04/04/2022   Exposure to potentially hazardous substance 04/04/2022   Gout 04/04/2022   Osteoarthritis 04/04/2022   Osteoporosis 04/04/2022   Other specified diseases of hair and hair follicles 04/04/2022   Pain in joint, lower leg 04/04/2022   Problem related to unspecified psychosocial circumstances 04/04/2022   General medical examination for administrative purposes 04/04/2022   Stage 3b chronic kidney disease (HCC)    Dysphagia, post-stroke    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   Cough 02/20/2022   Acute metabolic encephalopathy 02/20/2022   Obstructive hydrocephalus (HCC) 02/19/2022   Dyslipidemia 02/19/2022   Dysphagia 02/19/2022   AKI (acute kidney injury) (HCC)    Hyperkalemia    Anemia    Pleural effusion on right    Respiratory failure requiring intubation (HCC)    Cerebral edema (HCC)    Chronic atrial fibrillation (HCC)    Controlled type 2 diabetes mellitus with hyperglycemia, without long-term current use of insulin (HCC)    ICH (intracerebral hemorrhage) (HCC) 02/10/2022   Intracranial hemorrhage (HCC)    Anticoagulated    Moderate tricuspid regurgitation 07/26/2021   Moderate aortic valve stenosis 03/08/2020   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HCC) 02/18/2020   Macrocytic anemia 01/13/2020   Spleen enlargement 01/13/2020  Bilateral carotid artery stenosis 07/14/2018   Atrial fibrillation, chronic ([place]) 07/14/2018   Type 2 diabetes with nephropathy ([place]) 02/14/2016   Essential hypertension 08/16/2014   Hyperlipemia, mixed 08/16/2014   Renal insufficiency 08/16/2014    REFERRING DIAG: I62.9 (ICD-10-CM) - Nontraumatic intracranial hemorrhage, unspecified   THERAPY DIAG:  Unsteadiness on feet  Muscle weakness (generalized)  Difficulty in walking, not elsewhere classified  Rationale for Evaluation and Treatment  Rehabilitation  PERTINENT HISTORY: see MAR  PRECAUTIONS: fall risk  SUBJECTIVE: Doing okay.  If I sit too long, my left leg gets weak and stiff.  No falls since last week.  PAIN:  No pain reported in L knee today   OBJECTIVE:    TODAY'S TREATMENT: 07/16/2022 Activity Comments  Sitting marching, 2#, 2 x 10 reps, seated LAQ 2#, 2 x 10 reps Cues to perform seated exercise prior to standing activities  Gait x 75 ft, then 50 ft Stair negotiation, alternating step pattern with bilat rails, cues for slowed pace and upright posture   10 M walk:  17 sec (1.93 ft/sec)   Forward step and weightshift over hurdle, x 10 reps Mirror for visual cues, VCs for hip/knee flexion for foot clearance (not circumduction) and cues for widened BOS  Forward march in parallel bars Mirror for visual cues  Gait 50 ft x 2 reps with RW, min assist/min guard Cues for belly button forward and shoulders back, to slow pace  Sit<>Stand x 8 reps throughout session With turning to sit, Pt requires cues to fully back up, feel chair on legs and use hands to slow descent into chair (without cues, he doesn't fully turn and tries to sit prematurely)    M-CTSIB  Condition 1: Firm Surface, EO 30 Sec, Mild Sway  Condition 2: Firm Surface, EC 30 Sec, Moderate Sway  Condition 3: Foam Surface, EO 30 Sec, Moderate and Severe Sway  Condition 4: Foam Surface, EC 15.66 Sec, Severe Sway   Additional condition 4-eyes closed feet apart on compliant surface x 10 sec    TREATMENT: 07/11/2022 Activity Comments  NuStep, 4 extremities, Level 5, x 6 minutes, for lower extremity strengthening Cues for light use of UEs and attention to increased use of LLE  Lateral step and weightshift, over hurdle, x 10 reps Mirror for visual cues, VCs for increased weightshift towards L side  Forward step and weightshift over hurdle, x 10 reps Mirror for visual cues, VCs for hip/knee flexion for foot clearance (not circumduction) and cues for widened BOS   SLS with forward/back rolling soccer ball, x 10 reps UE support, tactile cues at gluts for upright posture  Using black wedge obstacles at midline in parallel bars, with mirror cues, forward/back walking to facilitate widened BOS, x 4 reps, then with wedge removed, x 2 reps, able to keep BOS wide Min assist and cues for weightshift laterally and for upright posture; used cue throughout standing activities:  "belly button forward"  Short distance gait between activities, initial cues for upright posture, using RW Min assist  Gait with large figure-8 turns around furniture, with RW Min assist and cues for pause to reset, when pt begins to increase pace.      HOME EXERCISE PROGRAM Last updated: 07/03/22 Access Code: [email or web address] URL: [email or web address] Date: 07/02/2022 Prepared by: [facility]  Exercises - Seated Hip Abduction  - 1 x daily - 5 x weekly - 2 sets - 10 reps - Seated Long Arc Quad  -  1 x daily - 5 x weekly - 2 sets - 10 reps - Sit to Stand with Counter Support  - 1 x daily - 5 x weekly - 2 sets - 10 reps - Standing Hip Abduction with Counter Support  - 1 x daily - 5 x weekly - 2 sets - 10 reps - Standing Alternating Knee Flexion with Ankle Weights  - 1 x daily - 5 x weekly - 2 sets - 10 reps   -------------------------------------------------------------------------------------------------------------------------   From eval  DIAGNOSTIC FINDINGS: see MAR   COGNITION: Overall cognitive status: Within functional limits for tasks assessed             SENSATION: WFL   COORDINATION: Bradykinesia, difficulty wit rapid alternating movements     MUSCLE TONE: WFL       POSTURE: No Significant postural limitations   LOWER EXTREMITY ROM:      WFL  (Blank rows = not tested)   LOWER EXTREMITY MMT:     MMT Right Eval Left Eval  Hip flexion 5 4  Hip extension 5 3+  Hip abduction 5 3-  Hip adduction      Hip  internal rotation      Hip external rotation      Knee flexion 5 4  Knee extension 5 4  Ankle dorsiflexion 5 4+  Ankle plantarflexion      Ankle inversion      Ankle eversion      (Blank rows = not tested)   BED MOBILITY:  Independent   TRANSFERS: Assistive device utilized: Environmental consultant - 2 wheeled  Sit to stand: Modified independence Stand to sit: Modified independence Chair to chair: SBA and CGA Floor:  DNT   RAMP:  Not available   CURB:  Level of Assistance: SBA and CGA Assistive device utilized: Environmental consultant - 2 wheeled Curb Comments: undershoots with LLE   STAIRS:           Level of Assistance: SBA and CGA           Stair Negotiation Technique: Alternating Pattern  with Bilateral Rails           Number of Stairs: 6             Height of Stairs: 4-6"            Comments: dysmetria w/ LLE   GAIT: Gait pattern: circumduction- Left and ataxic Distance walked: 100 Assistive device utilized: Walker - 2 wheeled Level of assistance: SBA Comments: dysmetria LLE   FUNCTIONAL TESTs:  Timed up and go (TUG): 20.25 with RW Berg Balance Scale: 35/56      M-CTSIB  Condition 1: Firm Surface, EO 30 Sec, Mild Sway  Condition 2: Firm Surface, EC 30 Sec, Mild and Moderate Sway  Condition 3: Foam Surface, EO 9 Sec, Severe Sway  Condition 4: Foam Surface, EC 0 Sec,  unable  Sway     07/02/22 M-CTSIB  Condition 1: Firm Surface, EO 30 Sec, Mild and Moderate Sway  Condition 2: Firm Surface, EC 30 Sec, Moderate Sway * required RW support to get into romberg  Condition 3: Foam Surface, EO 30 Sec, Severe Sway * required RW support to get into romberg  Condition 4: Foam Surface, EC NT Sec, NT Sway          PATIENT SURVEYS:  FOTO 54.43% functional status   TODAY'S TREATMENT:  Evaluation, sidestepping along counter, tandem stance at counter     PATIENT EDUCATION: Education details: scope of  PT intervention Person educated: Patient and Spouse Education method:  Explanation Education comprehension: verbalized understanding     HOME EXERCISE PROGRAM: sidestepping along counter, tandem stance at counter   -----------------------------------------------------------------------------------------------------------------------    GOALS: Goals reviewed with patient? Yes   SHORT TERM GOALS: Target date: 05/29/2022   Patient will perform HEP with family/caregiver supervision for improved strength, balance, transfers, and gait  Baseline: Goal status: MET   2.  Patient will achieve 15 seconds for TUG test to manifest reduced risk for falls Baseline: 20.25 with RW; 14 sec with RW Goal status: MET   3. Demo modified independent gait level surfaces and curb negotiation            Baseline: CGA-SBA with RW; unchanged 07/02/22            Goal status: On-going   LONG TERM GOALS: Target date: 08/13/2022   Patient will demonstrate score 45/56 Berg Balance Test to manifest low risk for falls Baseline: 35/56; 37/56 on 06/06/22; 37/56 07/02/22 Goal status: On-going   2.  Demonstrate improved static balance and postural control per time of 15 sec condition 4 M-CTSIB to improve safety with mobility on uneven surfaces Baseline: complete LOB left with condition 3; NT d/t safety 07/02/22; 8/21/203 15.66 sec, 10 sec Goal status: On-going   3.  Demo modified independent gait using least restrictive AD over various surfaces and ascend/descend stairs with set-up assist to improve functional mobility Baseline: able to navigate stairs with B handrail with CGA 07/02/22  Goal status: on-going   4.  Demo score of 57% functional status FOTO  Baseline: 54.43%; 57% 07/02/22  Goal status: MET       ASSESSMENT:   CLINICAL IMPRESSION: PROGRESS NOTE:  Pt subjectively reports that he feels better balanced than when he started therapy, noting he can walk now, where before, he could not stay balanced on L side.  Objective measure:  1.93 ft/sec gait velocity with RW in 60M  walk with min guard/ min assist.  MCTSIB:  pt able to stand 15.66 sec, then 10 sec on condition 4 with close supervision, improved from unable to perform at last check 2 weeks ago.  Pt responds well to visual and verbal cues for improved LLE weightshifting and stance time as well as for slowed pace of gait.  When verbal cues removed, he does exhibit hastening of gait, with decreased ability to regain balance to slow gait pace.  He has had 1 fall, in the past week.  He will continue to benefit from skilled PT towards goals for improved stability, balance, strength, and independent gait.  Plan to continue for remainder of POC towards goals.    OBJECTIVE IMPAIRMENTS Abnormal gait, decreased activity tolerance, decreased balance, decreased coordination, decreased endurance, decreased knowledge of use of DME, difficulty walking, decreased strength, impaired perceived functional ability, and improper body mechanics.    ACTIVITY LIMITATIONS carrying, lifting, bending, standing, squatting, stairs, transfers, and locomotion level   PARTICIPATION LIMITATIONS: meal prep, cleaning, laundry, driving, shopping, community activity, and yard work   PERSONAL FACTORS Age, Fitness, and Time since onset of injury/illness/exacerbation are also affecting patient's functional outcome.    REHAB POTENTIAL: Excellent   CLINICAL DECISION MAKING: Evolving/moderate complexity   EVALUATION COMPLEXITY: Moderate   PLAN: PT FREQUENCY: 2x/week   PT DURATION: 6 weeks   PLANNED INTERVENTIONS: Therapeutic exercises, Therapeutic activity, Neuromuscular re-education, Balance training, Gait training, Patient/Family education, Joint mobilization, Stair training, Vestibular training, Canalith repositioning, DME instructions, Aquatic Therapy, Electrical stimulation, Wheelchair mobility  training, and Manual therapy   PLAN FOR NEXT SESSION:  Work on weigthshifting, step strategy, reaching out of BOS; work on gait, turns, balance  reactions and balance on uneven surfaces; Stair ambulation.                    Mady Haagensen, PT 07/16/22 3:40 PM Phone: [phone] Fax: [phone]   [facility] at [facility] [place], [place], [place] [address] Phone # [phone] Fax # [phone]

## 2022-07-16 NOTE — Telephone Encounter (Signed)
Reviewed results with patient's wife who verbalized understanding.   They wish to proceed with LAAO on 07/26/2022.  Scheduled Vernon [name] for pre-procedure visit on 07/23/2022. They were grateful for call and agree with plan.

## 2022-07-16 NOTE — Patient Instructions (Signed)
For upcoming Watchman procedure, please ask your physician:  1)  Any restrictions or precautions in regards to PT, OT, speech therapy  2)To write a return to PT, OT, speech therapy order

## 2022-07-19 ENCOUNTER — Ambulatory Visit: Payer: Medicare Other | Admitting: Physical Therapy

## 2022-07-19 ENCOUNTER — Ambulatory Visit: Payer: Medicare Other | Admitting: Occupational Therapy

## 2022-07-19 DIAGNOSIS — M6281 Muscle weakness (generalized): Secondary | ICD-10-CM

## 2022-07-19 DIAGNOSIS — R278 Other lack of coordination: Secondary | ICD-10-CM

## 2022-07-19 DIAGNOSIS — R2681 Unsteadiness on feet: Secondary | ICD-10-CM

## 2022-07-19 DIAGNOSIS — D461 Refractory anemia with ring sideroblasts: Secondary | ICD-10-CM | POA: Diagnosis not present

## 2022-07-19 DIAGNOSIS — R2689 Other abnormalities of gait and mobility: Secondary | ICD-10-CM

## 2022-07-19 DIAGNOSIS — I69254 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: Secondary | ICD-10-CM

## 2022-07-19 NOTE — Therapy (Signed)
OUTPATIENT PHYSICAL THERAPY TREATMENT  Patient Name: Vernon [name] MRN: [id number] DOB:07-05-1946, 76 y.o., male Today's Date: 07/20/2022  PCP: Baxter Hire, MD  REFERRING PROVIDER: Baxter Hire, MD        END OF SESSION:   PT End of Session - 07/20/22 1205     Visit Number 20    Number of Visits 27    Date for PT Re-Evaluation 08/13/22    Authorization Type UHC Medicare    Progress Note Due on Visit 36    PT Start Time 1415   Pt arrived late   PT Stop Time 1445    PT Time Calculation (min) 30 min    Equipment Utilized During Treatment Gait belt    Activity Tolerance Patient tolerated treatment well;Patient limited by pain   acute L knee pain   Behavior During Therapy [facility] for tasks assessed/performed;Impulsive                     Past Medical History:  Diagnosis Date   Anemia    Aortic stenosis    Arthritis    Complication of anesthesia    hard time getting bp up after knee replacement   Diabetes mellitus without complication (HCC)    GERD (gastroesophageal reflux disease)    occ tums prn   History of hiatal hernia    Hypertension    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[place])    Past Surgical History:  Procedure Laterality Date   CARPAL TUNNEL RELEASE  2012   CRANIOTOMY N/A 02/10/2022   Procedure: SUBOCCIPITAL CRANIECTOMY FOR EVACUATION OF CEREBELLAR HEMATOMA;  Surgeon: Kristeen Miss, MD;  Location: [place];  Service: Neurosurgery;  Laterality: N/A;   JOINT REPLACEMENT Right 2010   SHOULDER ARTHROSCOPY WITH ROTATOR CUFF REPAIR AND OPEN BICEPS TENODESIS Right 11/09/2019   Procedure: RIGHT SHOULDER ARTHROSCOPY WITH SUBSCAPULARIS REPAIR, SUBACROMIAL DECOMPRESSION,MINI OPEN ROTATOR CUFF REPAIR;  Surgeon: Leim Fabry, MD;  Location: [facility];  Service: Orthopedics;  Laterality: Right;   Patient Active Problem List   Diagnosis Date Noted   Proteinuria, unspecified 05/23/2022   Simple renal cyst 05/23/2022   Long term current use of anticoagulant  04/04/2022   Rotator cuff syndrome 04/04/2022   Exposure to potentially hazardous substance 04/04/2022   Gout 04/04/2022   Osteoarthritis 04/04/2022   Osteoporosis 04/04/2022   Other specified diseases of hair and hair follicles 37/34/2876   Pain in joint, lower leg 04/04/2022   Problem related to unspecified psychosocial circumstances 04/04/2022   General medical examination for administrative purposes 04/04/2022   Stage 3b chronic kidney disease ([place])    Dysphagia, post-stroke    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 hemorrhage of brain ([place]) 02/26/2022   Cough 81/15/7262   Acute metabolic encephalopathy 03/55/9741   Obstructive hydrocephalus ([place]) 02/19/2022   Dyslipidemia 02/19/2022   Dysphagia 02/19/2022   AKI (acute kidney injury) ([place])    Hyperkalemia    Anemia    Pleural effusion on right    Respiratory failure requiring intubation (HCC)    Cerebral edema (HCC)    Chronic atrial fibrillation ([place])    Controlled type 2 diabetes mellitus with hyperglycemia, without long-term current use of insulin (HCC)    ICH (intracerebral hemorrhage) ([place]) 02/10/2022   Intracranial hemorrhage (HCC)    Anticoagulated    Moderate tricuspid regurgitation 07/26/2021   Moderate aortic valve stenosis 03/08/2020   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[place]) 02/18/2020   Macrocytic anemia 01/13/2020   Spleen enlargement 01/13/2020   Bilateral carotid artery stenosis 07/14/2018   Atrial  fibrillation, chronic ([place]) 07/14/2018   Type 2 diabetes with nephropathy ([place]) 02/14/2016   Essential hypertension 08/16/2014   Hyperlipemia, mixed 08/16/2014   Renal insufficiency 08/16/2014    REFERRING DIAG: I62.9 (ICD-10-CM) - Nontraumatic intracranial hemorrhage, unspecified   THERAPY DIAG:  Unsteadiness on feet  Muscle weakness (generalized)  Other abnormalities of gait and mobility  Rationale for Evaluation and Treatment Rehabilitation  PERTINENT HISTORY: see MAR  PRECAUTIONS: fall risk  SUBJECTIVE: Used the bike  at home last night 15-20 minutes with some resistance. (Have not been doing that recently).  Think I overdid it.  Knee is really bothering me-this morning couldn't walk due to pain.  PAIN:  Are you having pain? Yes: NPRS scale: 7/10 Pain location: L knee-inferior aspect and medial to knee Pain description: shooting pain, aching, throbbing Aggravating factors: rotating in and out of foot Relieving factors: arthritis medication    OBJECTIVE:  Treatment began with assisting pt from car into therapy session.  Given acute knee pain, caregiver/pt unsure if he can get into session unassisted.  Used pt's RW, PT min assist and cues for slowed pace from car>curb negotiation>into clinic, with antalgic gait pattern, decreased weightshift and decreased stance time LLE.    Assessed L knee pain:  anterior/posterior drawer test and valgus/varus test:  negative L knee.  Pt is tender to palpation and has some edema inferior and medial to patella  TODAY'S TREATMENT: 07/19/2022 Activity Comments  Performed seated LAQ x 10, marching in place x 10, ankle pumps x 10 Able to perform without pain  Educated pt in use of ice, gentle, painfree motion over the next few days; educated pt as to when he may need to followup with MD regarding knee pain   Gait using RW 85 ft x 2 at end of session, CGA and cues for leading with pelvis for more upright posture No c/o pain, improved gait pattern by end of session            PATIENT EDUCATION: Education details: See above Person educated: Patient Education method: Explanation, Demonstration, and Handouts Education comprehension: verbalized understanding and returned demonstration   TREATMENT: 07/16/2022 Activity Comments  Sitting marching, 2#, 2 x 10 reps, seated LAQ 2#, 2 x 10 reps Cues to perform seated exercise prior to standing activities  Gait x 75 ft, then 50 ft Stair negotiation, alternating step pattern with bilat rails, cues for slowed pace and upright posture    10 M walk:  17 sec (1.93 ft/sec)   Forward step and weightshift over hurdle, x 10 reps Mirror for visual cues, VCs for hip/knee flexion for foot clearance (not circumduction) and cues for widened BOS  Forward march in parallel bars Mirror for visual cues  Gait 50 ft x 2 reps with RW, min assist/min guard Cues for belly button forward and shoulders back, to slow pace  Sit<>Stand x 8 reps throughout session With turning to sit, Pt requires cues to fully back up, feel chair on legs and use hands to slow descent into chair (without cues, he doesn't fully turn and tries to sit prematurely)      HOME EXERCISE PROGRAM Last updated: 07/03/22 Access Code: [email or web address] URL: [email or web address] Date: 07/02/2022 Prepared by: [facility]  Exercises - Seated Hip Abduction  - 1 x daily - 5 x weekly - 2 sets - 10 reps - Seated Long Arc Quad  - 1 x daily - 5 x weekly - 2 sets - 10 reps -  Sit to Stand with Counter Support  - 1 x daily - 5 x weekly - 2 sets - 10 reps - Standing Hip Abduction with Counter Support  - 1 x daily - 5 x weekly - 2 sets - 10 reps - Standing Alternating Knee Flexion with Ankle Weights  - 1 x daily - 5 x weekly - 2 sets - 10 reps   -------------------------------------------------------------------------------------------------------------------------   From eval  DIAGNOSTIC FINDINGS: see MAR   COGNITION: Overall cognitive status: Within functional limits for tasks assessed             SENSATION: WFL   COORDINATION: Bradykinesia, difficulty wit rapid alternating movements     MUSCLE TONE: WFL       POSTURE: No Significant postural limitations   LOWER EXTREMITY ROM:      WFL  (Blank rows = not tested)   LOWER EXTREMITY MMT:     MMT Right Eval Left Eval  Hip flexion 5 4  Hip extension 5 3+  Hip abduction 5 3-  Hip adduction      Hip internal rotation      Hip external rotation      Knee flexion 5 4   Knee extension 5 4  Ankle dorsiflexion 5 4+  Ankle plantarflexion      Ankle inversion      Ankle eversion      (Blank rows = not tested)   BED MOBILITY:  Independent   TRANSFERS: Assistive device utilized: Environmental consultant - 2 wheeled  Sit to stand: Modified independence Stand to sit: Modified independence Chair to chair: SBA and CGA Floor:  DNT   RAMP:  Not available   CURB:  Level of Assistance: SBA and CGA Assistive device utilized: Environmental consultant - 2 wheeled Curb Comments: undershoots with LLE   STAIRS:           Level of Assistance: SBA and CGA           Stair Negotiation Technique: Alternating Pattern  with Bilateral Rails           Number of Stairs: 6             Height of Stairs: 4-6"            Comments: dysmetria w/ LLE   GAIT: Gait pattern: circumduction- Left and ataxic Distance walked: 100 Assistive device utilized: Walker - 2 wheeled Level of assistance: SBA Comments: dysmetria LLE   FUNCTIONAL TESTs:  Timed up and go (TUG): 20.25 with RW Berg Balance Scale: 35/56      M-CTSIB  Condition 1: Firm Surface, EO 30 Sec, Mild Sway  Condition 2: Firm Surface, EC 30 Sec, Mild and Moderate Sway  Condition 3: Foam Surface, EO 9 Sec, Severe Sway  Condition 4: Foam Surface, EC 0 Sec,  unable  Sway     07/02/22 M-CTSIB  Condition 1: Firm Surface, EO 30 Sec, Mild and Moderate Sway  Condition 2: Firm Surface, EC 30 Sec, Moderate Sway * required RW support to get into romberg  Condition 3: Foam Surface, EO 30 Sec, Severe Sway * required RW support to get into romberg  Condition 4: Foam Surface, EC NT Sec, NT Sway          PATIENT SURVEYS:  FOTO 54.43% functional status   TODAY'S TREATMENT:  Evaluation, sidestepping along counter, tandem stance at counter     PATIENT EDUCATION: Education details: scope of PT intervention Person educated: Patient and Spouse Education method: Explanation Education comprehension: verbalized understanding  HOME EXERCISE  PROGRAM: sidestepping along counter, tandem stance at counter   -----------------------------------------------------------------------------------------------------------------------    GOALS: Goals reviewed with patient? Yes   SHORT TERM GOALS: Target date: 05/29/2022   Patient will perform HEP with family/caregiver supervision for improved strength, balance, transfers, and gait  Baseline: Goal status: MET   2.  Patient will achieve 15 seconds for TUG test to manifest reduced risk for falls Baseline: 20.25 with RW; 14 sec with RW Goal status: MET   3. Demo modified independent gait level surfaces and curb negotiation            Baseline: CGA-SBA with RW; unchanged 07/02/22            Goal status: On-going   LONG TERM GOALS: Target date: 08/13/2022   Patient will demonstrate score 45/56 Berg Balance Test to manifest low risk for falls Baseline: 35/56; 37/56 on 06/06/22; 37/56 07/02/22 Goal status: On-going   2.  Demonstrate improved static balance and postural control per time of 15 sec condition 4 M-CTSIB to improve safety with mobility on uneven surfaces Baseline: complete LOB left with condition 3; NT d/t safety 07/02/22; 8/21/203 15.66 sec, 10 sec Goal status: On-going   3.  Demo modified independent gait using least restrictive AD over various surfaces and ascend/descend stairs with set-up assist to improve functional mobility Baseline: able to navigate stairs with B handrail with CGA 07/02/22  Goal status: on-going   4.  Demo score of 57% functional status FOTO  Baseline: 54.43%; 57% 07/02/22  Goal status: MET       ASSESSMENT:   CLINICAL IMPRESSION: Pt arrives late for PT session today, reporting acute onset L knee pain after doing 15-20 minute bout of moderate resistance on his foot pedaler bike at home previous night, which pt states he has not done in some time.  Initially, pt has antalgic gait pattern and increased reports of pain in L knee; however with brief  assessment and work on gentle pain-free ROM, pt notes decrease in pain and is able to ambulate with less antalgic, more normalized gait pattern using RW at end of session.  Will reassess as needed and continue to address strength, balance, stability with gait and standing activities.  OBJECTIVE IMPAIRMENTS Abnormal gait, decreased activity tolerance, decreased balance, decreased coordination, decreased endurance, decreased knowledge of use of DME, difficulty walking, decreased strength, impaired perceived functional ability, and improper body mechanics.    ACTIVITY LIMITATIONS carrying, lifting, bending, standing, squatting, stairs, transfers, and locomotion level   PARTICIPATION LIMITATIONS: meal prep, cleaning, laundry, driving, shopping, community activity, and yard work   PERSONAL FACTORS Age, Fitness, and Time since onset of injury/illness/exacerbation are also affecting patient's functional outcome.    REHAB POTENTIAL: Excellent   CLINICAL DECISION MAKING: Evolving/moderate complexity   EVALUATION COMPLEXITY: Moderate   PLAN: PT FREQUENCY: 2x/week   PT DURATION: 6 weeks   PLANNED INTERVENTIONS: Therapeutic exercises, Therapeutic activity, Neuromuscular re-education, Balance training, Gait training, Patient/Family education, Joint mobilization, Stair training, Vestibular training, Canalith repositioning, DME instructions, Aquatic Therapy, Electrical stimulation, Wheelchair mobility training, and Manual therapy   PLAN FOR NEXT SESSION:  Ask about knee pain; Work on weigthshifting, step strategy, reaching out of BOS; work on gait, turns, balance reactions and balance on uneven surfaces; Stair ambulation. Working towards [employer], PT 07/20/22 12:16 PM Phone: [phone] Fax: [phone]   [facility] [address] [place],  [place], [place] [address] Phone # [phone] Fax # [phone]

## 2022-07-19 NOTE — Therapy (Signed)
OUTPATIENT OCCUPATIONAL THERAPY Treatment Note   Patient Name: Vernon [name] MRN: [id number] DOB:10/30/1946, 76 y.o., male Today's Date: 07/19/2022  PCP: Baxter Hire, MD REFERRING PROVIDER: Baxter Hire, MD      OT End of Session - 07/19/22 1448     Visit Number 17    Number of Visits 25    Date for OT Re-Evaluation 08/10/22    Authorization Type UHC Medicare    Authorization Time Period VL: [place]    Progress Note Due on Visit 20    OT Start Time 1447    OT Stop Time 1531    OT Time Calculation (min) 44 min    Activity Tolerance Patient tolerated treatment well    Behavior During Therapy WFL for tasks assessed/performed                     Past Medical History:  Diagnosis Date   Anemia    Aortic stenosis    Arthritis    Complication of anesthesia    hard time getting bp up after knee replacement   Diabetes mellitus without complication (HCC)    GERD (gastroesophageal reflux disease)    occ tums prn   History of hiatal hernia    Hypertension    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[place])    Past Surgical History:  Procedure Laterality Date   CARPAL TUNNEL RELEASE  2012   CRANIOTOMY N/A 02/10/2022   Procedure: SUBOCCIPITAL CRANIECTOMY FOR EVACUATION OF CEREBELLAR HEMATOMA;  Surgeon: Kristeen Miss, MD;  Location: [place];  Service: Neurosurgery;  Laterality: N/A;   JOINT REPLACEMENT Right 2010   SHOULDER ARTHROSCOPY WITH ROTATOR CUFF REPAIR AND OPEN BICEPS TENODESIS Right 11/09/2019   Procedure: RIGHT SHOULDER ARTHROSCOPY WITH SUBSCAPULARIS REPAIR, SUBACROMIAL DECOMPRESSION,MINI OPEN ROTATOR CUFF REPAIR;  Surgeon: Leim Fabry, MD;  Location: [facility];  Service: Orthopedics;  Laterality: Right;   Patient Active Problem List   Diagnosis Date Noted   Proteinuria, unspecified 05/23/2022   Simple renal cyst 05/23/2022   Long term current use of anticoagulant 04/04/2022   Rotator cuff syndrome 04/04/2022   Exposure to potentially hazardous substance  04/04/2022   Gout 04/04/2022   Osteoarthritis 04/04/2022   Osteoporosis 04/04/2022   Other specified diseases of hair and hair follicles 99/83/3825   Pain in joint, lower leg 04/04/2022   Problem related to unspecified psychosocial circumstances 04/04/2022   General medical examination for administrative purposes 04/04/2022   Stage 3b chronic kidney disease ([place])    Dysphagia, post-stroke    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 hemorrhage of brain ([place]) 02/26/2022   Cough 05/39/7673   Acute metabolic encephalopathy 41/93/7902   Obstructive hydrocephalus ([place]) 02/19/2022   Dyslipidemia 02/19/2022   Dysphagia 02/19/2022   AKI (acute kidney injury) ([place])    Hyperkalemia    Anemia    Pleural effusion on right    Respiratory failure requiring intubation (HCC)    Cerebral edema (HCC)    Chronic atrial fibrillation ([place])    Controlled type 2 diabetes mellitus with hyperglycemia, without long-term current use of insulin (HCC)    ICH (intracerebral hemorrhage) ([place]) 02/10/2022   Intracranial hemorrhage (HCC)    Anticoagulated    Moderate tricuspid regurgitation 07/26/2021   Moderate aortic valve stenosis 03/08/2020   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[place]) 02/18/2020   Macrocytic anemia 01/13/2020   Spleen enlargement 01/13/2020   Bilateral carotid artery stenosis 07/14/2018   Atrial fibrillation, chronic ([place]) 07/14/2018   Type 2 diabetes with nephropathy ([place]) 02/14/2016   Essential hypertension 08/16/2014  Hyperlipemia, mixed 08/16/2014   Renal insufficiency 08/16/2014    ONSET DATE: 02/10/2022  REFERRING DIAG: I62.9 (ICD-10-CM) - Nontraumatic intracranial hemorrhage, unspecified  THERAPY DIAG:  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  Pt accompanied by: self  PERTINENT HISTORY: cerebellar hemorrhage w/ shift and early hydrocephalus; emergent decompression surgery w/ Dr. Ellene Route on 02/11/22 w/ EVD pulled 02/14/22. PMH includes DMT2, HTN, HLD, atrial fibrillation on Eliquis, arthritis, anemia, and GERD  PAIN: Are you having pain? Yes.  Pt reports 0/10 on pain scale but reports "residual ache" in L knee.  PATIENT GOALS: Be independent again; play guitar  OBJECTIVE:   TODAY'S TREATMENT:  [place]: Engaged in reaching for and transferring rings from cones on R to L with LUE.  Pt demonstrating significant improvements in motor control when picking up and transferring rings from R<> L.  As pt fatigues, demonstrating increasing uncoordinated movement - knocking over cone when crossing midline. GMC: picking up large jacks and matching to colored cups.  Pt demonstrating mild ataxia at end range, however significantly improved motor control when picking up and reaching up to place into matching containers. Increased challenge when crossing midline with mild increase in ataxia, however still much improved.  Pt demonstrating overall relaxed posture at shoulder with table top tasks and reaching outside BOS. Saebo glide: Closed- chain AAROM of L shoulder flexion and abduction with elbow extended using Saebo glide at 45 degree angle while sitting upright. Pt completed 3x10, demonstrating improved motor control without ataxia. Box and Blocks: L: 36 and 37    PATIENT EDUCATION: Ongoing condition-specific education, particularly regarding neuro reed and typical recovery patterns as well as benefit and goal of weight bearing/increased somatosensory input through affected UE Person educated: Patient Education method: Explanation Education comprehension: verbalized understanding   HOME EXERCISE PROGRAM: Coordination handout -  see pt instructions  Access Code: [email or web address] URL: [email or web address] Date: 06/04/2022 Prepared by: [facility]    GOALS: Goals reviewed with patient? Yes  SHORT TERM GOALS: Target date: 06/08/22  STG  Status:  1 Pt will demonstrate independence w/ initial HEP designed for LUE GM and Ellis Baseline: No HEP at this time Progressing  2 Pt will improve participation in functional bilateral FM tasks as evidenced by decreasing time to complete 9-Hole Peg Test w/ LUE by at least 6 sec Baseline: Right: 21.8  sec; Left: 51.2 sec Progressing -  L: 1:05.35 on 07/04/22  3 Pt will be able to complete simulated bilateral functional task (e.g. cutting food, manipulating clothing fasteners, washing dishes) within reasonable amount of time Baseline: Difficulty w/ bilateral tasks Met - pt reports "I have to be deliberate, but I can get it done"     LONG TERM GOALS: Target Date: 08/10/22  LTG  Status:  1 Pt will improve participation in functional activities as evidenced by increasing FOTO score to 71 by d/c Baseline: 62 Progressing - score 61 on 07/04/22  2 Pt will improve Box and Blocks score to at least 40 blocks by d/c to indicate improved unilateral GMC of LUE Baseline: Right 61 blocks, Left 29 blocks Progressing - 07/04/22 - 36 blocks  3 Pt will  safely demonstrate simulated grilling activity, incorporating compensatory strategies/AE prn, w/ Mod I by d/c Baseline: Unable to complete at this time Progressing - have not be walking outside  4 Pt will verbalize understanding/awareness of modifications and strategies to minimize effects of ataxia on ADLs, IADLs, and leisure tasks.   Baseline:  Progressing  5 Pt will demonstrate improved motor control of LUE during functional reach to place light-mod weight items on shelf at shoulder height. Baseline: Progressing    ASSESSMENT:  CLINICAL IMPRESSION: Treatment session with focus on closed and open chain  movements with focus on improved motor control.   Pt demonstrating significant improvements in motor control this session.  Pt reports overall "relaxed" position with improvements in control when more relaxed in nature. Pt continues to benefit from intermittent tactile cues for improved posture and positioning during exercises. Pt demonstrates decreased impact of ataxia when able to increase proprioceptive input through UE by either closed chain on surface or stabilizing elbow along torso, however demonstrating significant improvements with open chain movements this session. Pt will continue to benefit from activities with focus on body alignment, WB and closed progressing to open chain activities to incorporate into functional tasks to decrease impact of ataxia on functional reach.  PERFORMANCE DEFICITS in functional skills including ADLs, IADLs, coordination, dexterity, sensation, strength, FMC, GMC, mobility, balance, body mechanics, and UE functional use.  IMPAIRMENTS are limiting patient from ADLs, IADLs, and leisure.   COMORBIDITIES has co-morbidities such as atrial fibrillation on Eliquis and DMT2  that affects occupational performance. Patient will benefit from skilled OT to address above impairments and improve overall function.  MODIFICATION OR ASSISTANCE TO COMPLETE EVALUATION: Min-Moderate modification of tasks or assist with assess necessary to complete an evaluation.  OT OCCUPATIONAL PROFILE AND HISTORY: Detailed assessment: Review of records and additional review of physical, cognitive, psychosocial history related to current functional performance.  CLINICAL DECISION MAKING: Moderate - several treatment options, min-mod task modification necessary  REHAB POTENTIAL: Good  EVALUATION COMPLEXITY: Moderate   PLAN: OT FREQUENCY: 2x/week  OT DURATION: 4 weeks  PLANNED INTERVENTIONS: self care/ADL training, therapeutic exercise, therapeutic activity, neuromuscular re-education,  manual therapy, functional mobility training, aquatic therapy, electrical stimulation, ultrasound, biofeedback, moist heat, cryotherapy, patient/family education, energy conservation, and DME and/or AE instructions  RECOMMENDED OTHER SERVICES: To receive PT/ST services at this location  CONSULTED AND AGREED WITH PLAN OF CARE: Patient and family member/caregiver  PLAN FOR NEXT SESSION:   GMC/FMC activities for LUE. Focus on postural control to address ataxia.  Discuss possibility of compression garments to decrease ataxia. Seated vs standing weight-bearing, closed-chain exercise, coordination activities, etc.  Functional in nature, if possible, to carry over to pt desire to play guitar again.    Simonne Come, OTR/L 07/19/2022, 3:46 PM

## 2022-07-19 NOTE — Patient Instructions (Signed)
Over the next few days:  do some gentle exercises in the painfree ROM., several times per day  -ankle pumps, 5-10 reps -seated march, 5-10 reps -seated leg kicks, 5-10 reps  Ice your knee, 10-15 minutes, several times a day  *If your knee hurts worse, prevents you from putting your weight on it in the next few days, you will need to follow-up with MD to get your knee looked at.

## 2022-07-20 ENCOUNTER — Institutional Professional Consult (permissible substitution): Payer: Medicare Other | Admitting: Cardiology

## 2022-07-20 ENCOUNTER — Encounter: Payer: Self-pay | Admitting: Physical Therapy

## 2022-07-23 ENCOUNTER — Ambulatory Visit: Payer: Medicare Other

## 2022-07-23 ENCOUNTER — Encounter: Payer: Self-pay | Admitting: Cardiology

## 2022-07-23 ENCOUNTER — Ambulatory Visit: Payer: Medicare Other | Attending: Physician Assistant | Admitting: Cardiology

## 2022-07-23 VITALS — BP 102/60 | HR 56 | Ht 69.0 in | Wt 200.4 lb

## 2022-07-23 DIAGNOSIS — R2681 Unsteadiness on feet: Secondary | ICD-10-CM

## 2022-07-23 DIAGNOSIS — I619 Nontraumatic intracerebral hemorrhage, unspecified: Secondary | ICD-10-CM

## 2022-07-23 DIAGNOSIS — I482 Chronic atrial fibrillation, unspecified: Secondary | ICD-10-CM | POA: Diagnosis not present

## 2022-07-23 DIAGNOSIS — I10 Essential (primary) hypertension: Secondary | ICD-10-CM

## 2022-07-23 DIAGNOSIS — R471 Dysarthria and anarthria: Secondary | ICD-10-CM

## 2022-07-23 DIAGNOSIS — R41841 Cognitive communication deficit: Secondary | ICD-10-CM

## 2022-07-23 DIAGNOSIS — R262 Difficulty in walking, not elsewhere classified: Secondary | ICD-10-CM

## 2022-07-23 DIAGNOSIS — R2689 Other abnormalities of gait and mobility: Secondary | ICD-10-CM

## 2022-07-23 DIAGNOSIS — M6281 Muscle weakness (generalized): Secondary | ICD-10-CM

## 2022-07-23 LAB — BASIC METABOLIC PANEL
BUN/Creatinine Ratio: 23 (ref 10–24)
BUN: 33 mg/dL — ABNORMAL HIGH (ref 8–27)
CO2: 27 mmol/L (ref 20–29)
Calcium: 10 mg/dL (ref 8.6–10.2)
Chloride: 107 mmol/L — ABNORMAL HIGH (ref 96–106)
Creatinine, Ser: 1.41 mg/dL — ABNORMAL HIGH (ref 0.76–1.27)
Glucose: 151 mg/dL — ABNORMAL HIGH (ref 70–99)
Potassium: 4.6 mmol/L (ref 3.5–5.2)
Sodium: 142 mmol/L (ref 134–144)
eGFR: 52 mL/min/{1.73_m2} — ABNORMAL LOW (ref >59)

## 2022-07-23 LAB — CBC
Hematocrit: 27.7 % — ABNORMAL LOW (ref 37.5–51.0)
Hemoglobin: 9.3 g/dL — ABNORMAL LOW (ref 13.0–17.7)
MCH: 32.2 pg (ref 26.6–33.0)
MCHC: 33.6 g/dL (ref 31.5–35.7)
MCV: 96 fL (ref 79–97)
Platelets: 282 10*3/uL (ref 150–450)
RBC: 2.89 x10E6/uL — ABNORMAL LOW (ref 4.14–5.80)
RDW: 28.6 % — ABNORMAL HIGH (ref 11.6–15.4)
WBC: 5.7 10*3/uL (ref 3.4–10.8)

## 2022-07-23 NOTE — Progress Notes (Signed)
[facility]                                     Cardiology Office Note:    Date:  07/23/2022   ID:  Vernon [name], DOB Jan 19, 1946, MRN [id number]  PCP:  Baxter Hire, MD  [facility] Cardiologist:  None  [facility] Electrophysiologist:  Vickie Epley, MD   Referring MD: Baxter Hire, MD   Chief Complaint  Patient presents with   Follow-up    Pre Watchman    History of Present Illness:    Vernon [name] is a 76 y.o. male with a hx of DM2, GERD, MDS, HTN, aortic stenosis, and atrial fibrillation complication by bleeding while on anticoagulation who was recently referred to Dr. Quentin Ore for the evaluation of Watchman implantation.   [name] presented to [facility] 02/10/22 with left-sided weakness and incoordination as well as nausea vomiting. 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 Eliquis was reversed and he ultimately underwent Patient suboccipital craniectomy with evacuation of cerebellar hemorrhage and placement of right parieto-occipital ventriculostomy 02/11/2022 per Dr. Ellene Route.   He did well post operatively and was discharged to a SNF. He is now back at home and he continues to participate with OP PT twice weekly.   He continued to express concern with bleeding risks associated with Eliquis and wished to discuss alternatives to anticoagulation. He was referred to EP for further consideration and discussion of the Watchman procedure. He underwent pre Watchman imaging that showed anatomy suitable for implant.    Today he is here with his wife and reports no palpitations, chest pain, shortness of breath, or peripheral edema. No lightheadedness, headaches, syncope, orthopnea, or PND. All procedure questions have been answered.   Past Medical History:  Diagnosis Date   Anemia    Aortic stenosis    Arthritis    Complication of anesthesia    hard time  getting bp up after knee replacement   Diabetes mellitus without complication (HCC)    GERD (gastroesophageal reflux disease)    occ tums prn   History of hiatal hernia    Hypertension    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[place])     Past Surgical History:  Procedure Laterality Date   CARPAL TUNNEL RELEASE  2012   CRANIOTOMY N/A 02/10/2022   Procedure: SUBOCCIPITAL CRANIECTOMY FOR EVACUATION OF CEREBELLAR HEMATOMA;  Surgeon: Kristeen Miss, MD;  Location: [place];  Service: Neurosurgery;  Laterality: N/A;   JOINT REPLACEMENT Right 2010   SHOULDER ARTHROSCOPY WITH ROTATOR CUFF REPAIR AND OPEN BICEPS TENODESIS Right 11/09/2019   Procedure: RIGHT SHOULDER ARTHROSCOPY WITH SUBSCAPULARIS REPAIR, SUBACROMIAL DECOMPRESSION,MINI OPEN ROTATOR CUFF REPAIR;  Surgeon: Leim Fabry, MD;  Location: [facility];  Service: Orthopedics;  Laterality: Right;    Current Medications: Current Meds  Medication Sig   ACCU-CHEK GUIDE test strip USE 1 STRIP 3 TIMES A DAY AS INSTRUCTED   acetaminophen (TYLENOL) 650 MG CR tablet Take 650 mg by mouth every 8 (eight) hours as needed for pain.   allopurinol (ZYLOPRIM) 100 MG tablet Take 100 mg by mouth daily.   apixaban (ELIQUIS) 5 MG TABS tablet Take 5 mg by mouth 2 (two) times daily.    blood glucose meter kit and supplies KIT Dispense based on patient and insurance preference. Use up to four times  daily as directed.   Insulin Pen Needle (CAREFINE PEN NEEDLES) 32G X 5 MM MISC 1 application. by Does not apply route daily after breakfast.   metFORMIN (GLUCOPHAGE) 500 MG tablet Take 1 tablet by mouth 2 (two) times daily with a meal.   ondansetron (ZOFRAN) 4 MG tablet Take 4 mg by mouth every 8 (eight) hours as needed.   polyethylene glycol (MIRALAX / GLYCOLAX) 17 g packet Take 17 g by mouth daily.   senna-docusate (SENOKOT-S) 8.6-50 MG tablet Take 1 tablet by mouth 2 (two) times daily.   Current Facility-Administered Medications for the 07/23/22 encounter (Office Visit) with  [facility]  Medication   0.9 %  sodium chloride infusion   0.9 %  sodium chloride infusion     Allergies:   Patient has no known allergies.   Social History   Socioeconomic History   Marital status: Married    Spouse name: Not on file   Number of children: Not on file   Years of education: Not on file   Highest education level: Not on file  Occupational History   Not on file  Tobacco Use   Smoking status: Former    Packs/day: 0.50    Years: 8.00    Total pack years: 4.00    Types: Cigarettes    Quit date: 07/21/1978    Years since quitting: 44.0   Smokeless tobacco: Never  Vaping Use   Vaping Use: Never used  Substance and Sexual Activity   Alcohol use: Yes    Alcohol/week: 0.0 - 1.0 standard drinks of alcohol    Comment: rare beer   Drug use: Never   Sexual activity: Not on file  Other Topics Concern   Not on file  Social History Narrative   Lives in [place]; with wife; quit smoking in early 81s; ocassional/ rare [may be 1 a month beer]. 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    Social Determinants of Health   Financial Resource Strain: Not on file  Food Insecurity: Not on file  Transportation Needs: Not on file  Physical Activity: Not on file  Stress: Not on file  Social Connections: Not on file     Family History: The patient's family history includes Cancer in his maternal uncle.  ROS:   Please see the history of present illness.    All other systems reviewed and are negative.  EKGs/Labs/Other Studies Reviewed:    The following studies were reviewed today:  CT 21-Jul-2022:  IMPRESSION: 1. There is normal pulmonary vein drainage into the left atrium.   2. The left atrial appendage is patent.   3. Watchman Flx measurements- recommend 31 mm Watchman FLX device.   4. Coronary calcium score of 5415, which is 97th percentile for age, sex, and race matched control. Study done without nitroglycerin; consider future ischemic  testing if indicated.   5. Right atrial enlargement and coronary sinus dilation suggestive of significant tricuspid regurgitation.   6. Aortic valve calcium score of 2136. Consider functional testing for aortic stenosis.   EKG:  EKG is ordered today.  The ekg ordered today demonstrates AF with SVR  Recent Labs: 02/12/2022: B Natriuretic Peptide 854.2 02/21/2022: Magnesium 1.9 02/27/2022: ALT 24 06/27/2022: BUN 28; Creatinine, Ser 1.35; Platelets 292; Potassium 4.3; Sodium 139 07/11/2022: Hemoglobin 8.9   Recent Lipid Panel    Component Value Date/Time   CHOL 68 02/11/2022 0423   TRIG 46 02/14/2022 0450   HDL 27 (L) 02/11/2022 0423   CHOLHDL  2.5 02/11/2022 0423   VLDL 16 02/11/2022 0423   LDLCALC 25 02/11/2022 0423   Risk Assessment/Calculations:    HAS-BLED score 3 Hypertension Yes  Abnormal renal and liver function (Dialysis, transplant, Cr >2.26 mg/dL /Cirrhosis or Bilirubin >2x Normal or AST/ALT/AP >3x Normal) No  Stroke No  Bleeding Yes  Labile INR (Unstable/high INR) No  Elderly (>65) Yes  Drugs or alcohol (? 8 drinks/week, anti-plt or NSAID) No    CHA2DS2-VASc Score = 4  The patient's score is based upon: CHF History: 0 HTN History: 1 Diabetes History: 1 Stroke History: 0 Vascular Disease History: 0 Age Score: 2 Gender Score: 0   Physical Exam:    VS:  BP 102/60   Pulse (!) 56   Ht $R'5\' 9"'VX$  (1.753 m)   Wt 200 lb 6.4 oz (90.9 kg)   SpO2 95%   BMI 29.59 kg/m     Wt Readings from Last 3 Encounters:  07/23/22 200 lb 6.4 oz (90.9 kg)  06/27/22 206 lb 9.6 oz (93.7 kg)  06/21/22 202 lb (91.6 kg)    General: Well developed, well nourished, NAD Neck: Negative for carotid bruits. No JVD Lungs:Clear to ausculation bilaterally. No wheezes, rales, or rhonchi. Breathing is unlabored. Cardiovascular: Irregularly irregular. No murmurs Extremities: No edema. Neuro: Alert and oriented. No focal deficits. No facial asymmetry. MAE spontaneously. Psych: Responds to  questions appropriately with normal affect.    ASSESSMENT/PLAN:    Permanent atrial fibrillation: Patient with hx of intracranial bleeding while on anticoagulation who was seen by Dr. Quentin Ore for Bradley implantation. Procedure and medication plan reviewed today at length and patient is ready for Watchman scheduled for 8/31 with Dr. Quentin Ore. Obtain BMET, CBC today. Instruction letter reviewed.   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  No new neuro changes. Tolerating Eliquis with no adverse signs of bleeding.   HTN: Soft today. No dizziness. Continue current regimen     Medication Adjustments/Labs and Tests Ordered: Current medicines are reviewed at length with the patient today.  Concerns regarding medicines are outlined above.  Orders Placed This Encounter  Procedures   Basic metabolic panel   CBC   EKG 12-Lead   No orders of the defined types were placed in this encounter.   Patient Instructions  Medication Instructions:  Your physician recommends that you continue on your current medications as directed. Please refer to the Current Medication list given to you today.  *If you need a refill on your cardiac medications before your next appointment, please call your pharmacy*   Lab Work: TODAY: CBC, BMET  If you have labs (blood work) drawn today and your tests are completely normal, you will receive your results only by: [place] (if you have MyChart) OR A paper copy in the mail If you have any lab test that is abnormal or we need to change your treatment, we will call you to review the results.   Testing/Procedures: Watchman Procedure on 8/31. See Instruction Letter   Follow-Up: To be arranged on the day of your procedure  Important Information About Sugar         Signed, Kathyrn Drown, NP  07/23/2022 1:31 PM    [facility]

## 2022-07-23 NOTE — Therapy (Signed)
OUTPATIENT SPEECH LANGUAGE PATHOLOGY TREATMENT NOTE   Patient Name: Vernon [name] MRN: [id number] DOB:1946-07-29, 76 y.o., male Today's Date: 07/23/2022  PCP: Baxter Hire, MD  REFERRING PROVIDER: Baxter Hire, MD   END OF SESSION:   End of Session - 07/23/22 2147     Visit Number 17    Number of Visits 25    Date for SLP Re-Evaluation 07/26/22    Authorization Type UHC    SLP Start Time 1533    SLP Stop Time  1610    SLP Time Calculation (min) 37 min    Activity Tolerance Patient tolerated treatment well                      Past Medical History:  Diagnosis Date   Anemia    Aortic stenosis    Arthritis    Complication of anesthesia    hard time getting bp up after knee replacement   Diabetes mellitus without complication (HCC)    GERD (gastroesophageal reflux disease)    occ tums prn   History of hiatal hernia    Hypertension    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[place])    Past Surgical History:  Procedure Laterality Date   CARPAL TUNNEL RELEASE  2012   CRANIOTOMY N/A 02/10/2022   Procedure: SUBOCCIPITAL CRANIECTOMY FOR EVACUATION OF CEREBELLAR HEMATOMA;  Surgeon: Kristeen Miss, MD;  Location: [place];  Service: Neurosurgery;  Laterality: N/A;   JOINT REPLACEMENT Right 2010   SHOULDER ARTHROSCOPY WITH ROTATOR CUFF REPAIR AND OPEN BICEPS TENODESIS Right 11/09/2019   Procedure: RIGHT SHOULDER ARTHROSCOPY WITH SUBSCAPULARIS REPAIR, SUBACROMIAL DECOMPRESSION,MINI OPEN ROTATOR CUFF REPAIR;  Surgeon: Leim Fabry, MD;  Location: [facility];  Service: Orthopedics;  Laterality: Right;   Patient Active Problem List   Diagnosis Date Noted   Proteinuria, unspecified 05/23/2022   Simple renal cyst 05/23/2022   Long term current use of anticoagulant 04/04/2022   Rotator cuff syndrome 04/04/2022   Exposure to potentially hazardous substance 04/04/2022   Gout 04/04/2022   Osteoarthritis 04/04/2022   Osteoporosis 04/04/2022   Other specified diseases of hair and  hair follicles 92/92/4462   Pain in joint, lower leg 04/04/2022   Problem related to unspecified psychosocial circumstances 04/04/2022   General medical examination for administrative purposes 04/04/2022   Stage 3b chronic kidney disease ([place])    Dysphagia, post-stroke    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 hemorrhage of brain ([place]) 02/26/2022   Cough 86/38/1771   Acute metabolic encephalopathy 16/57/9038   Obstructive hydrocephalus ([place]) 02/19/2022   Dyslipidemia 02/19/2022   Dysphagia 02/19/2022   AKI (acute kidney injury) ([place])    Hyperkalemia    Anemia    Pleural effusion on right    Respiratory failure requiring intubation (HCC)    Cerebral edema (HCC)    Chronic atrial fibrillation ([place])    Controlled type 2 diabetes mellitus with hyperglycemia, without long-term current use of insulin (HCC)    ICH (intracerebral hemorrhage) ([place]) 02/10/2022   Intracranial hemorrhage (HCC)    Anticoagulated    Moderate tricuspid regurgitation 07/26/2021   Moderate aortic valve stenosis 03/08/2020   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[place]) 02/18/2020   Macrocytic anemia 01/13/2020   Spleen enlargement 01/13/2020   Bilateral carotid artery stenosis 07/14/2018   Atrial fibrillation, chronic ([place]) 07/14/2018   Type 2 diabetes with nephropathy ([place]) 02/14/2016   Essential hypertension 08/16/2014   Hyperlipemia, mixed 08/16/2014   Renal insufficiency 08/16/2014    PERTINENT HISTORY:  March 2023 experienced hemorrhage in cerebellar peduncle and  underwent craniotomy for evacuation. Notes his LUE and LLE are most affected at present. Underwent acute hospitalization, and [facility]. Recently D/C from home health on 04/30/22. Did not receive HHST, only HHPT/OT.     ONSET DATE: 02/10/22   REFERRING DIAG: I62.9 (ICD-10-CM) - Nontraumatic intracranial hemorrhage, unspecified   THERAPY DIAG:  Dysarthria and anarthria  Cognitive communication deficit  Rationale for Evaluation and Treatment  Rehabilitation  SUBJECTIVE: [name] attended ST with pt today. "Leave me time to take a breath with the numbers," pt told her, indicating that he wants to take time for each number for Phonation Resistance Training Exercises (PhoRTE).   PAIN:  Are you having pain? No   OBJECTIVE:   TODAY'S TREATMENT: 07/23/22: ENT is 08/10/22 at 1400, with Dr. Richardson Landry. Pt completed Phonation Resistance Training Exercises (PhoRTE) with independence. Pt completed with /a/ - 5 high pitched and 5 regular speech frequency /a/. Pt with intermittent voicing with decr'd breath support with WNL speech pitched /a/ but stronger and consistent voicing with high pitched /a/. SLP discussed rationale for ENT eval and that if pt performs PhoRTE until then pt will likely have had all vocal fold strength return that is possible at this time. SLP put ST on hold until ENT eval. As at initial recording [name] completed PROM with cognitive questionnaire - she scored pt 129, > 101 initially.  8/21/23[name] states ENT appointment has been made but [name] has not told him details as of currently. Pt performed PHOrTE exercises today with mod IConstance Holster did not demo fatigue with /a/, nor with numbers low-high-low. Sentences, he demo'd fatigue with numbers at 8. SLP shaped pt's /a/ for lower pitch, Pt definitely had more vocal breaks at a normal speaking pitch than /a/ at a high pitch. He req'd min A rarely for loudness and then was mod I. SLP told pt to perform 5 /a/ with high pitch, then 5 at speaking pitch, then 2-3 more /a/ at high pitch this next week.   8/14/23[name] reported the ENT appointment is in process. Questions about his therapy schedule were answered with excellent success. Pt told SLP about upcoming appointments, correctly, with intermittent use of calendar. [name] performed PHOrTE exercises with modified independence and stated, "I've seen some good progress with my voice." Pt  fatigued following 8 reps of /a/, 7-8 reps of numbers,  after 7-8 high-pitched sentences, and did not appear to fatigue with low-pitched sentences. Pt agreed that two more sessions were appropriate, once/week.  07/04/22: Pt is unsure if ENT appointment has been made yet. Today pt answered questions about his schedule with 95% accuracy. He performed his PHOrTE exercises with rare min A. SLP and pt agreed he could decr to once/week. Verbally, pt told me three sequences with WNL organization and memory.  8/7/23[name] entered tx room and correctly told SLP appointments today and next therapy day (Wednesday). He told SLP challenge with checkbook is with legibility due to spaces being too thin so he is using two spaces. SLP agreed with pt with this. He performed PHOrTE HEP with SBA with pitch variability with numbers. SLP told pt SLP is recommending ENT consult due to voice still not WNL - but it has improved since initial eval. SLP got two names of ENTs in [place] area as pt is from [employer]. SLP provided these names for pt and asked pt to show wife, which SLP learned he did after his last tx session today, when wife arrived.  PATIENT EDUCATION: Education details: See "treatment" above Person educated: Patient Education method: Explanation, demo, Verbal cues Education comprehension: verbalized understanding, returned demonstration, verbal cues required, and needs further education      GOALS: Goals reviewed with patient? Yes   SHORT TERM GOALS: Target date: 05/31/22 Pt will complete HEP for voice/dysarthria with occasional min A over 2 sessions Baseline: 05/24/22 Goal status: Met   2.  Pt will use external aids to recall daily events/schedule with occasional min A from spouse over 1 week Baseline:  Goal status: Deferred   3.  Pt will achieve clear phonation 18/20 sentences in structured task Baseline:  Goal status: Not met   4.  Pt will ID dysnomic errors and self correct with rare min A over 2 sessions Baseline:  Goal status:  Deferred     LONG TERM GOALS: Target date: 07/26/22 Pt will achieve clear phonation over 10 minute conversation with rare min A over 2 sessions Baseline:  Goal status: Not met   2.  Pt will independently recall weekly/daily schedule, appointments events and pertinent information with memory aids over 2 sessions Baseline: 07-02-22 Goal status: Met   3.  Pt will complete vocal warm ups and follow 3 vocal hygiene strategies to return to singing for 5-10 minute periods with rare min A Baseline:  Goal status: Not met   4.  Improve score on Cognitive function PROM by 4 points Baseline: 101- filled out by spouse Goal status: Met   ASSESSMENT:   CLINICAL IMPRESSION: Patient is a 76 y.o. male who is seen for dysarthria/voice and cognitive linguistics s/p CVA. See tx note. Pt has ENT appointment 08/13/22. His Watchman device will be placed 07/26/22. 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 I recommend cont'd skilled ST to maximize voice and cognition for safety, to reduce caregiver burden and QOL. [name] and SLP agreed pt oculd reduce to once/week. SLP put pt on hold until after ENT eval, with [name] and [name] continuing with PHOrTE until ENT eval. Goals to be generated after that evaluation, or possible d/c depending on results of eval.   OBJECTIVE IMPAIRMENTS include attention, memory, awareness, dysarthria, and voice disorder. These impairments are limiting patient from managing appointments, household responsibilities, and effectively communicating at home and in community. Factors affecting potential to achieve goals and functional outcome are ability to learn/carryover information.. Patient will benefit from skilled SLP services to address above impairments and improve overall function.   REHAB POTENTIAL: Good   PLAN: SLP FREQUENCY: 1x/week   SLP DURATION: 12 weeks   PLANNED INTERVENTIONS: Language facilitation, Environmental controls, Cueing hierachy, Cognitive reorganization,  Internal/external aids, Functional tasks, Multimodal communication approach, and SLP instruction and feedback         Frederic, [place] 07/23/2022, 9:48 PM

## 2022-07-23 NOTE — Therapy (Signed)
OUTPATIENT PHYSICAL THERAPY TREATMENT  Patient Name: Vernon [name] MRN: [id number] DOB:[birth year], 76 y.o., male Today's Date: 07/23/2022  PCP: Baxter Hire, MD  REFERRING PROVIDER: Baxter Hire, MD        END OF SESSION:   PT End of Session - 07/23/22 1609     Visit Number 21    Number of Visits 27    Date for PT Re-Evaluation 08/13/22    Authorization Type [employer] Medicare    Progress Note Due on Visit 4    PT Start Time 1615    PT Stop Time 1700    PT Time Calculation (min) 45 min    Equipment Utilized During Treatment Gait belt    Activity Tolerance Patient tolerated treatment well;Patient limited by pain   acute L knee pain   Behavior During Therapy [facility] for tasks assessed/performed;Impulsive                     Past Medical History:  Diagnosis Date   Anemia    Aortic stenosis    Arthritis    Complication of anesthesia    hard time getting bp up after knee replacement   Diabetes mellitus without complication (HCC)    GERD (gastroesophageal reflux disease)    occ tums prn   History of hiatal hernia    Hypertension    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[place])    Past Surgical History:  Procedure Laterality Date   CARPAL TUNNEL RELEASE  2012   CRANIOTOMY N/A 02/10/2022   Procedure: SUBOCCIPITAL CRANIECTOMY FOR EVACUATION OF CEREBELLAR HEMATOMA;  Surgeon: Kristeen Miss, MD;  Location: [place];  Service: Neurosurgery;  Laterality: N/A;   JOINT REPLACEMENT Right 2010   SHOULDER ARTHROSCOPY WITH ROTATOR CUFF REPAIR AND OPEN BICEPS TENODESIS Right 11/09/2019   Procedure: RIGHT SHOULDER ARTHROSCOPY WITH SUBSCAPULARIS REPAIR, SUBACROMIAL DECOMPRESSION,MINI OPEN ROTATOR CUFF REPAIR;  Surgeon: Leim Fabry, MD;  Location: [facility];  Service: Orthopedics;  Laterality: Right;   Patient Active Problem List   Diagnosis Date Noted   Proteinuria, unspecified 05/23/2022   Simple renal cyst 05/23/2022   Long term current use of anticoagulant 04/04/2022   Rotator  cuff syndrome 04/04/2022   Exposure to potentially hazardous substance 04/04/2022   Gout 04/04/2022   Osteoarthritis 04/04/2022   Osteoporosis 04/04/2022   Other specified diseases of hair and hair follicles 56/81/2751   Pain in joint, lower leg 04/04/2022   Problem related to unspecified psychosocial circumstances 04/04/2022   General medical examination for administrative purposes 04/04/2022   Stage 3b chronic kidney disease ([place])    Dysphagia, post-stroke    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 hemorrhage of brain ([place]) 02/26/2022   Cough 70/11/7492   Acute metabolic encephalopathy 49/67/5916   Obstructive hydrocephalus ([place]) 02/19/2022   Dyslipidemia 02/19/2022   Dysphagia 02/19/2022   AKI (acute kidney injury) ([place])    Hyperkalemia    Anemia    Pleural effusion on right    Respiratory failure requiring intubation (HCC)    Cerebral edema (HCC)    Chronic atrial fibrillation ([place])    Controlled type 2 diabetes mellitus with hyperglycemia, without long-term current use of insulin (HCC)    ICH (intracerebral hemorrhage) ([place]) 02/10/2022   Intracranial hemorrhage (HCC)    Anticoagulated    Moderate tricuspid regurgitation 07/26/2021   Moderate aortic valve stenosis 03/08/2020   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[place]) 02/18/2020   Macrocytic anemia 01/13/2020   Spleen enlargement 01/13/2020   Bilateral carotid artery stenosis 07/14/2018   Atrial fibrillation, chronic ([place]) 07/14/2018  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   Essential hypertension 08/16/2014   Hyperlipemia, mixed 08/16/2014   Renal insufficiency 08/16/2014    REFERRING DIAG: I62.9 (ICD-10-CM) - Nontraumatic intracranial hemorrhage, unspecified   THERAPY DIAG:  Unsteadiness on feet  Muscle weakness (generalized)  Other abnormalities of gait and mobility  Difficulty in walking, not elsewhere classified  Rationale for Evaluation and Treatment Rehabilitation  PERTINENT HISTORY: see MAR  PRECAUTIONS: fall  risk  SUBJECTIVE: left knee is still bothering along medial area/joint line  PAIN:  Are you having pain? Yes: NPRS scale: 4-6/10 Pain location: L knee-inferior aspect and medial to knee Pain description: shooting pain, aching, throbbing Aggravating factors: rotating in and out of foot Relieving factors: arthritis medication    OBJECTIVE:   TODAY'S TREATMENT: 07/23/22 Activity Comments  Supine quad sets 2x2 min Ice pack applied left knee  Prone hamstring curls 3x10 15 lbs LLE  LAQ 3x10 15 lbs LLE  Standing hip flexion 3x10 15 lbs LLE  Standing on foam feet apart/together -EO/EC 2x15 sec -head turns: visual targets  Gait training Ambulation w/ CGA level surfaces with head turns/maintaining head position and performing 180 degree turns     PATIENT EDUCATION: Education details: See above Person educated: Patient Education method: Explanation, Demonstration, and Handouts Education comprehension: verbalized understanding and returned demonstration      HOME EXERCISE PROGRAM Last updated: 07/03/22 Access Code: [email or web address] URL: [email or web address] Date: 07/02/2022 Prepared by: [facility] - Outpatient  Rehab - [facility]  Exercises - Seated Hip Abduction  - 1 x daily - 5 x weekly - 2 sets - 10 reps - Seated Long Arc Quad  - 1 x daily - 5 x weekly - 2 sets - 10 reps - Sit to Stand with Counter Support  - 1 x daily - 5 x weekly - 2 sets - 10 reps - Standing Hip Abduction with Counter Support  - 1 x daily - 5 x weekly - 2 sets - 10 reps - Standing Alternating Knee Flexion with Ankle Weights  - 1 x daily - 5 x weekly - 2 sets - 10 reps   -------------------------------------------------------------------------------------------------------------------------   From eval  DIAGNOSTIC FINDINGS: see MAR   COGNITION: Overall cognitive status: Within functional limits for tasks assessed             SENSATION: WFL   COORDINATION: Bradykinesia, difficulty wit  rapid alternating movements     MUSCLE TONE: WFL       POSTURE: No Significant postural limitations   LOWER EXTREMITY ROM:      WFL  (Blank rows = not tested)   LOWER EXTREMITY MMT:     MMT Right Eval Left Eval  Hip flexion 5 4  Hip extension 5 3+  Hip abduction 5 3-  Hip adduction      Hip internal rotation      Hip external rotation      Knee flexion 5 4  Knee extension 5 4  Ankle dorsiflexion 5 4+  Ankle plantarflexion      Ankle inversion      Ankle eversion      (Blank rows = not tested)   BED MOBILITY:  Independent   TRANSFERS: Assistive device utilized: Walker - 2 wheeled  Sit to stand: Modified independence Stand to sit: Modified independence Chair to chair: SBA and CGA Floor:  DNT   RAMP:  Not available   CURB:  Level of Assistance: SBA and CGA Assistive device utilized: Walker - 2 wheeled   Curb Comments: undershoots with LLE   STAIRS:           Level of Assistance: SBA and CGA           Stair Negotiation Technique: Alternating Pattern  with Bilateral Rails           Number of Stairs: 6             Height of Stairs: 4-6"            Comments: dysmetria w/ LLE   GAIT: Gait pattern: circumduction- Left and ataxic Distance walked: 100 Assistive device utilized: Walker - 2 wheeled Level of assistance: SBA Comments: dysmetria LLE   FUNCTIONAL TESTs:  Timed up and go (TUG): 20.25 with RW Berg Balance Scale: 35/56      M-CTSIB  Condition 1: Firm Surface, EO 30 Sec, Mild Sway  Condition 2: Firm Surface, EC 30 Sec, Mild and Moderate Sway  Condition 3: Foam Surface, EO 9 Sec, Severe Sway  Condition 4: Foam Surface, EC 0 Sec,  unable  Sway     07/02/22 M-CTSIB  Condition 1: Firm Surface, EO 30 Sec, Mild and Moderate Sway  Condition 2: Firm Surface, EC 30 Sec, Moderate Sway * required RW support to get into romberg  Condition 3: Foam Surface, EO 30 Sec, Severe Sway * required RW support to get into romberg  Condition 4: Foam Surface, EC NT  Sec, NT Sway          PATIENT SURVEYS:  FOTO 54.43% functional status   TODAY'S TREATMENT:  Evaluation, sidestepping along counter, tandem stance at counter     PATIENT EDUCATION: Education details: scope of PT intervention Person educated: Patient and Spouse Education method: Explanation Education comprehension: verbalized understanding     HOME EXERCISE PROGRAM: sidestepping along counter, tandem stance at counter   -----------------------------------------------------------------------------------------------------------------------    GOALS: Goals reviewed with patient? Yes   SHORT TERM GOALS: Target date: 05/29/2022   Patient will perform HEP with family/caregiver supervision for improved strength, balance, transfers, and gait  Baseline: Goal status: MET   2.  Patient will achieve 15 seconds for TUG test to manifest reduced risk for falls Baseline: 20.25 with RW; 14 sec with RW Goal status: MET   3. Demo modified independent gait level surfaces and curb negotiation            Baseline: CGA-SBA with RW; unchanged 07/02/22            Goal status: On-going   LONG TERM GOALS: Target date: 08/13/2022   Patient will demonstrate score 45/56 Berg Balance Test to manifest low risk for falls Baseline: 35/56; 37/56 on 06/06/22; 37/56 07/02/22 Goal status: On-going   2.  Demonstrate improved static balance and postural control per time of 15 sec condition 4 M-CTSIB to improve safety with mobility on uneven surfaces Baseline: complete LOB left with condition 3; NT d/t safety 07/02/22; 8/21/203 15.66 sec, 10 sec Goal status: On-going   3.  Demo modified independent gait using least restrictive AD over various surfaces and ascend/descend stairs with set-up assist to improve functional mobility Baseline: able to navigate stairs with B handrail with CGA 07/02/22  Goal status: on-going   4.  Demo score of 57% functional status FOTO  Baseline: 54.43%; 57% 07/02/22  Goal  status: MET       ASSESSMENT:   CLINICAL IMPRESSION: Pt notes continued left medial knee pain but overall improved symptoms and reduced swelling as compared to past few days.  Tx  session emphasis on gentle OKC PRE with heavy resistance emphasis on slow, controlled movement for improved strength and selective control.  Demo tendency for retro-LOB with eyes closed condition on foam surfaces and tendency for pronounced postural sway with feet together firm/foam surfaces. Improved gait pattern/stability noted as decrease in pathway deviation with head turns during ambulation. Continued sessions to progress safety with gait and mobility to reduce risk for falls and progress to ambulation over uneven surfaces  OBJECTIVE IMPAIRMENTS Abnormal gait, decreased activity tolerance, decreased balance, decreased coordination, decreased endurance, decreased knowledge of use of DME, difficulty walking, decreased strength, impaired perceived functional ability, and improper body mechanics.    ACTIVITY LIMITATIONS carrying, lifting, bending, standing, squatting, stairs, transfers, and locomotion level   PARTICIPATION LIMITATIONS: meal prep, cleaning, laundry, driving, shopping, community activity, and yard work   PERSONAL FACTORS Age, Fitness, and Time since onset of injury/illness/exacerbation are also affecting patient's functional outcome.    REHAB POTENTIAL: Excellent   CLINICAL DECISION MAKING: Evolving/moderate complexity   EVALUATION COMPLEXITY: Moderate   PLAN: PT FREQUENCY: 2x/week   PT DURATION: 6 weeks   PLANNED INTERVENTIONS: Therapeutic exercises, Therapeutic activity, Neuromuscular re-education, Balance training, Gait training, Patient/Family education, Joint mobilization, Stair training, Vestibular training, Canalith repositioning, DME instructions, Aquatic Therapy, Electrical stimulation, Wheelchair mobility training, and Manual therapy   PLAN FOR NEXT SESSION:  Ask about knee pain; Work  on weigthshifting, step strategy, reaching out of BOS; work on gait, turns, balance reactions and balance on uneven surfaces; Stair ambulation. Working towards Lilly              5:06 PM, 07/23/22 M. Sherlyn Lees, PT, DPT Physical Therapist- [place] Office Number: [phone]    [place] [facility] [address], [place], [place] [address] Phone # [phone] Fax # [phone]

## 2022-07-23 NOTE — Patient Instructions (Signed)
Medication Instructions:  Your physician recommends that you continue on your current medications as directed. Please refer to the Current Medication list given to you today.  *If you need a refill on your cardiac medications before your next appointment, please call your pharmacy*   Lab Work: TODAY: CBC, BMET  If you have labs (blood work) drawn today and your tests are completely normal, you will receive your results only by: [place] (if you have MyChart) OR A paper copy in the mail If you have any lab test that is abnormal or we need to change your treatment, we will call you to review the results.   Testing/Procedures: Watchman Procedure on 8/31. See Instruction Letter   Follow-Up: To be arranged on the day of your procedure  Important Information About Sugar

## 2022-07-25 ENCOUNTER — Inpatient Hospital Stay: Payer: Medicare Other

## 2022-07-25 ENCOUNTER — Institutional Professional Consult (permissible substitution): Payer: Medicare Other | Admitting: Cardiology

## 2022-07-25 ENCOUNTER — Telehealth: Payer: Self-pay

## 2022-07-25 VITALS — BP 136/72 | HR 61

## 2022-07-25 DIAGNOSIS — D469 Myelodysplastic syndrome, unspecified: Secondary | ICD-10-CM

## 2022-07-25 LAB — HEMOGLOBIN AND HEMATOCRIT, BLOOD
HCT: 28.6 % — ABNORMAL LOW (ref 39.0–52.0)
Hemoglobin: 9.2 g/dL — ABNORMAL LOW (ref 13.0–17.0)

## 2022-07-25 MED ORDER — DARBEPOETIN ALFA 100 MCG/0.5ML IJ SOSY
100.0000 ug | PREFILLED_SYRINGE | Freq: Once | INTRAMUSCULAR | Status: AC
  Administered 2022-07-25: 100 ug via SUBCUTANEOUS
  Filled 2022-07-25: qty 0.5

## 2022-07-25 NOTE — Anesthesia Preprocedure Evaluation (Addendum)
Anesthesia Evaluation  Patient identified by MRN, date of birth, ID band Patient awake    Reviewed: Allergy & Precautions, NPO status , Patient's Chart, lab work & pertinent test results  Airway Mallampati: II  TM Distance: >3 FB Neck ROM: Full    Dental  (+) Dental Advisory Given, Lower Dentures, Edentulous Upper, Missing, Poor Dentition   Pulmonary neg pulmonary ROS, former smoker,    Pulmonary exam normal breath sounds clear to auscultation       Cardiovascular hypertension,  Rhythm:Regular Rate:Normal + Systolic murmurs Echo 07/1637 1. Left ventricular ejection fraction, by estimation, is 55 to 60%. The left ventricle has normal function. The left ventricle has no regional wall motion abnormalities. There is moderate left ventricular hypertrophy. Left ventricular diastolic parameters are indeterminate.  2. Right ventricular systolic function is normal. The right ventricular size is normal. There is moderately elevated pulmonary artery systolic pressure.  3. Left atrial size was severely dilated.  4. Right atrial size was mild to moderately dilated.  5. A small pericardial effusion is present. There is no evidence of cardiac tamponade.  6. Mild mitral valve regurgitation.  7. There is moderate calcification of the aortic valve. Aortic valve regurgitation is not visualized. Mild aortic valve stenosis. Aortic valve area, by VTI measures 1.03 cm. Aortic valve mean gradient measures 19.0 mmHg.  8. The inferior vena cava is dilated in size with <50% respiratory variability, suggesting right atrial pressure of 15 mmHg.    Neuro/Psych negative neurological ROS     GI/Hepatic Neg liver ROS, hiatal hernia, GERD  ,  Endo/Other  diabetes  Renal/GU Renal disease     Musculoskeletal  (+) Arthritis ,   Abdominal   Peds  Hematology  (+) Blood dyscrasia, anemia ,   Anesthesia Other Findings   Reproductive/Obstetrics                            Anesthesia Physical Anesthesia Plan  ASA: 4  Anesthesia Plan: General   Post-op Pain Management: Minimal or no pain anticipated   Induction: Intravenous  PONV Risk Score and Plan: 2 and Treatment may vary due to age or medical condition, Ondansetron and Dexamethasone  Airway Management Planned: Oral ETT  Additional Equipment: Arterial line  Intra-op Plan:   Post-operative Plan: Extubation in OR  Informed Consent: I have reviewed the patients History and Physical, chart, labs and discussed the procedure including the risks, benefits and alternatives for the proposed anesthesia with the patient or authorized representative who has indicated his/her understanding and acceptance.     Dental advisory given  Plan Discussed with: CRNA  Anesthesia Plan Comments:        Anesthesia Quick Evaluation

## 2022-07-25 NOTE — Telephone Encounter (Signed)
Confirmed procedure date of 07/26/2022. Confirmed arrival time of 0530 for procedure time at 0730. Reviewed pre-procedure instructions with patient's wife, who will bring him tomorrow. She understands that for same day discharge, she will need to drive and be able to stay with him for 24 hours once home. She was grateful for call and agrees with plan.

## 2022-07-26 ENCOUNTER — Inpatient Hospital Stay (HOSPITAL_COMMUNITY): Payer: Medicare Other | Admitting: Anesthesiology

## 2022-07-26 ENCOUNTER — Other Ambulatory Visit: Payer: Self-pay | Admitting: Cardiology

## 2022-07-26 ENCOUNTER — Encounter (HOSPITAL_COMMUNITY): Payer: Self-pay | Admitting: Cardiology

## 2022-07-26 ENCOUNTER — Other Ambulatory Visit (HOSPITAL_COMMUNITY): Payer: Self-pay

## 2022-07-26 ENCOUNTER — Inpatient Hospital Stay (HOSPITAL_COMMUNITY): Payer: Medicare Other

## 2022-07-26 ENCOUNTER — Other Ambulatory Visit: Payer: Self-pay

## 2022-07-26 ENCOUNTER — Inpatient Hospital Stay (HOSPITAL_COMMUNITY)
Admission: RE | Admit: 2022-07-26 | Discharge: 2022-07-26 | DRG: 274 | Disposition: A | Discharge status: Home / Self Care | Payer: Medicare Other | Source: Ambulatory Visit | Attending: Cardiology | Admitting: Cardiology

## 2022-07-26 ENCOUNTER — Encounter: Payer: Self-pay | Admitting: Internal Medicine

## 2022-07-26 ENCOUNTER — Ambulatory Visit: Payer: Medicare Other | Admitting: Physical Therapy

## 2022-07-26 ENCOUNTER — Inpatient Hospital Stay (HOSPITAL_COMMUNITY)
Admission: RE | Disposition: A | Discharge status: Home / Self Care | Payer: Medicare Other | Source: Ambulatory Visit | Attending: Cardiology

## 2022-07-26 DIAGNOSIS — E119 Type 2 diabetes mellitus without complications: Secondary | ICD-10-CM | POA: Diagnosis not present

## 2022-07-26 DIAGNOSIS — Z96611 Presence of right artificial shoulder joint: Secondary | ICD-10-CM | POA: Diagnosis present

## 2022-07-26 DIAGNOSIS — I629 Nontraumatic intracranial hemorrhage, unspecified: Secondary | ICD-10-CM

## 2022-07-26 DIAGNOSIS — Z7984 Long term (current) use of oral hypoglycemic drugs: Secondary | ICD-10-CM

## 2022-07-26 DIAGNOSIS — Z95818 Presence of other cardiac implants and grafts: Secondary | ICD-10-CM

## 2022-07-26 DIAGNOSIS — I69254 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: Secondary | ICD-10-CM

## 2022-07-26 DIAGNOSIS — D539 Nutritional anemia, unspecified: Secondary | ICD-10-CM | POA: Diagnosis present

## 2022-07-26 DIAGNOSIS — I350 Nonrheumatic aortic (valve) stenosis: Secondary | ICD-10-CM | POA: Diagnosis not present

## 2022-07-26 DIAGNOSIS — Z87891 Personal history of nicotine dependence: Secondary | ICD-10-CM

## 2022-07-26 DIAGNOSIS — I4891 Unspecified atrial fibrillation: Secondary | ICD-10-CM

## 2022-07-26 DIAGNOSIS — I482 Chronic atrial fibrillation, unspecified: Secondary | ICD-10-CM

## 2022-07-26 DIAGNOSIS — I10 Essential (primary) hypertension: Secondary | ICD-10-CM

## 2022-07-26 DIAGNOSIS — I3139 Other pericardial effusion (noninflammatory): Secondary | ICD-10-CM | POA: Diagnosis not present

## 2022-07-26 DIAGNOSIS — K219 Gastro-esophageal reflux disease without esophagitis: Secondary | ICD-10-CM | POA: Diagnosis not present

## 2022-07-26 DIAGNOSIS — Z006 Encounter for examination for normal comparison and control in clinical research program: Secondary | ICD-10-CM

## 2022-07-26 DIAGNOSIS — M1990 Unspecified osteoarthritis, unspecified site: Secondary | ICD-10-CM | POA: Diagnosis present

## 2022-07-26 DIAGNOSIS — I4821 Permanent atrial fibrillation: Secondary | ICD-10-CM | POA: Diagnosis not present

## 2022-07-26 DIAGNOSIS — I619 Nontraumatic intracerebral hemorrhage, unspecified: Secondary | ICD-10-CM | POA: Diagnosis present

## 2022-07-26 DIAGNOSIS — I358 Other nonrheumatic aortic valve disorders: Secondary | ICD-10-CM | POA: Diagnosis not present

## 2022-07-26 DIAGNOSIS — Z7901 Long term (current) use of anticoagulants: Secondary | ICD-10-CM

## 2022-07-26 DIAGNOSIS — D469 Myelodysplastic syndrome, unspecified: Secondary | ICD-10-CM | POA: Diagnosis present

## 2022-07-26 HISTORY — PX: TEE WITHOUT CARDIOVERSION: SHX5443

## 2022-07-26 HISTORY — PX: LEFT ATRIAL APPENDAGE OCCLUSION: EP1229

## 2022-07-26 HISTORY — DX: Presence of other cardiac implants and grafts: Z95.818

## 2022-07-26 LAB — TYPE AND SCREEN
ABO/RH(D): O POS
Antibody Screen: NEGATIVE

## 2022-07-26 LAB — GLUCOSE, CAPILLARY
Glucose-Capillary: 167 mg/dL — ABNORMAL HIGH (ref 70–99)
Glucose-Capillary: 163 mg/dL — ABNORMAL HIGH (ref 70–99)

## 2022-07-26 LAB — ECHO TEE
AV Mean grad: 3 mmHg
AV Peak grad: 6.6 mmHg
Ao pk vel: 1.28 m/s

## 2022-07-26 LAB — SURGICAL PCR SCREEN
MRSA, PCR: NEGATIVE
Staphylococcus aureus: POSITIVE — AB

## 2022-07-26 LAB — POCT ACTIVATED CLOTTING TIME: Activated Clotting Time: 251 seconds

## 2022-07-26 SURGERY — LEFT ATRIAL APPENDAGE OCCLUSION
Anesthesia: General

## 2022-07-26 MED ORDER — LIDOCAINE 2% (20 MG/ML) 5 ML SYRINGE
INTRAMUSCULAR | Status: DC | PRN
  Administered 2022-07-26: 100 mg via INTRAVENOUS

## 2022-07-26 MED ORDER — LACTATED RINGERS IV SOLN: INTRAVENOUS | Status: DC

## 2022-07-26 MED ORDER — PHENYLEPHRINE HCL-NACL 20-0.9 MG/250ML-% IV SOLN
INTRAVENOUS | Status: DC | PRN
  Administered 2022-07-26: 30 ug/min via INTRAVENOUS

## 2022-07-26 MED ORDER — SODIUM CHLORIDE 0.9% FLUSH: 3.0000 mL | Freq: Two times a day (BID) | INTRAVENOUS | Status: DC

## 2022-07-26 MED ORDER — SUGAMMADEX SODIUM 200 MG/2ML IV SOLN
INTRAVENOUS | Status: DC | PRN
  Administered 2022-07-26: 200 mg via INTRAVENOUS

## 2022-07-26 MED ORDER — CALCIUM CHLORIDE 10 % IV SOLN
INTRAVENOUS | Status: AC
Start: 2022-07-26 — End: ?
  Filled 2022-07-26: qty 10

## 2022-07-26 MED ORDER — SODIUM CHLORIDE 0.9% FLUSH: 3.0000 mL | INTRAVENOUS | Status: DC | PRN

## 2022-07-26 MED ORDER — PHENYLEPHRINE 80 MCG/ML (10ML) SYRINGE FOR IV PUSH (FOR BLOOD PRESSURE SUPPORT)
PREFILLED_SYRINGE | INTRAVENOUS | Status: DC | PRN
  Administered 2022-07-26: 80 ug via INTRAVENOUS

## 2022-07-26 MED ORDER — CEFAZOLIN SODIUM-DEXTROSE 2-4 GM/100ML-% IV SOLN
2.0000 g | INTRAVENOUS | Status: AC
  Administered 2022-07-26: 2 g via INTRAVENOUS
  Filled 2022-07-26: qty 100

## 2022-07-26 MED ORDER — HEPARIN SODIUM (PORCINE) 1000 UNIT/ML IJ SOLN
INTRAMUSCULAR | Status: DC | PRN
  Administered 2022-07-26: 13000 [IU] via INTRAVENOUS
  Administered 2022-07-26: 4000 [IU] via INTRAVENOUS

## 2022-07-26 MED ORDER — ONDANSETRON HCL 4 MG/2ML IJ SOLN: 4.0000 mg | Freq: Four times a day (QID) | INTRAMUSCULAR | Status: DC | PRN

## 2022-07-26 MED ORDER — HEPARIN (PORCINE) IN NACL 1000-0.9 UT/500ML-% IV SOLN
INTRAVENOUS | Status: DC | PRN
  Administered 2022-07-26 (×2): 500 mL

## 2022-07-26 MED ORDER — CHLORHEXIDINE GLUCONATE 0.12 % MT SOLN
OROMUCOSAL | Status: AC
  Administered 2022-07-26: 15 mL
  Filled 2022-07-26: qty 15

## 2022-07-26 MED ORDER — DEXAMETHASONE SODIUM PHOSPHATE 10 MG/ML IJ SOLN
INTRAMUSCULAR | Status: DC | PRN
  Administered 2022-07-26: 5 mg via INTRAVENOUS

## 2022-07-26 MED ORDER — APIXABAN 2.5 MG PO TABS
2.5000 mg | ORAL_TABLET | Freq: Two times a day (BID) | ORAL | 1 refills | Status: DC
  Filled 2022-07-26: qty 60, 30d supply, fill #0

## 2022-07-26 MED ORDER — ACETAMINOPHEN 325 MG PO TABS: 650.0000 mg | ORAL_TABLET | ORAL | Status: DC | PRN

## 2022-07-26 MED ORDER — PROPOFOL 10 MG/ML IV BOLUS
INTRAVENOUS | Status: DC | PRN
  Administered 2022-07-26: 160 mg via INTRAVENOUS

## 2022-07-26 MED ORDER — FENTANYL CITRATE (PF) 100 MCG/2ML IJ SOLN
INTRAMUSCULAR | Status: AC
  Filled 2022-07-26: qty 2

## 2022-07-26 MED ORDER — PROTAMINE SULFATE 10 MG/ML IV SOLN
INTRAVENOUS | Status: DC | PRN
  Administered 2022-07-26: 30 mg via INTRAVENOUS

## 2022-07-26 MED ORDER — INSULIN ASPART 100 UNIT/ML IJ SOLN: 0.0000 [IU] | INTRAMUSCULAR | Status: DC | PRN

## 2022-07-26 MED ORDER — ROCURONIUM BROMIDE 10 MG/ML (PF) SYRINGE
PREFILLED_SYRINGE | INTRAVENOUS | Status: DC | PRN
  Administered 2022-07-26: 10 mg via INTRAVENOUS
  Administered 2022-07-26: 60 mg via INTRAVENOUS

## 2022-07-26 MED ORDER — FENTANYL CITRATE (PF) 250 MCG/5ML IJ SOLN
INTRAMUSCULAR | Status: DC | PRN
  Administered 2022-07-26 (×2): 25 ug via INTRAVENOUS
  Administered 2022-07-26: 50 ug via INTRAVENOUS

## 2022-07-26 MED ORDER — ONDANSETRON HCL 4 MG/2ML IJ SOLN
INTRAMUSCULAR | Status: DC | PRN
  Administered 2022-07-26: 4 mg via INTRAVENOUS

## 2022-07-26 MED ORDER — IOHEXOL 350 MG/ML SOLN
INTRAVENOUS | Status: DC | PRN
  Administered 2022-07-26: 20 mL

## 2022-07-26 MED ORDER — HEPARIN SODIUM (PORCINE) 1000 UNIT/ML IJ SOLN
INTRAMUSCULAR | Status: DC | PRN
  Administered 2022-07-26: 1000 [IU] via INTRAVENOUS

## 2022-07-26 MED ORDER — SODIUM CHLORIDE 0.9 % IV SOLN: INTRAVENOUS | Status: DC

## 2022-07-26 MED ORDER — SODIUM CHLORIDE 0.9 % IV SOLN: 250.0000 mL | INTRAVENOUS | Status: DC | PRN

## 2022-07-26 SURGICAL SUPPLY — 25 items
CATH DIAG 6FR PIGTAIL ANGLED (CATHETERS) IMPLANT
CATH NUVISION NON NAV ICE (CATHETERS) IMPLANT
CLOSURE PERCLOSE PROSTYLE (VASCULAR PRODUCTS) IMPLANT
COVER SWIFTLINK CONNECTOR (BAG) IMPLANT
DEVICE WATCHMAN FLX PROC (KITS) IMPLANT
DILATOR VESSEL 38 20CM 11FR (INTRODUCER) IMPLANT
DRYSEAL FLEXSHEATH 16FR 33CM (SHEATH)
KIT HEART LEFT (KITS) ×1 IMPLANT
KIT SHEA VERSACROSS LAAC CONNE (KITS) IMPLANT
PACK CARDIAC CATHETERIZATION (CUSTOM PROCEDURE TRAY) ×1 IMPLANT
PAD DEFIB RADIO PHYSIO CONN (PAD) ×1 IMPLANT
SHEATH AVANTI 11F 11CM (SHEATH) IMPLANT
SHEATH DRYSEAL FLEX 16FR 33CM (SHEATH) IMPLANT
SHEATH PERFORMER 16FR 30 (SHEATH) IMPLANT
SHEATH PINNACLE 8F 10CM (SHEATH) IMPLANT
SHEATH PROBE COVER 6X72 (BAG) ×1 IMPLANT
SYS WATCHMAN FXD DBL (SHEATH) ×1
SYSTEM WATCHMAN FXD DBL (SHEATH) IMPLANT
TRANSDUCER W/STOPCOCK (MISCELLANEOUS) ×1 IMPLANT
TUBING ART PRESS 72  MALE/FEM (TUBING) ×1
TUBING ART PRESS 72 MALE/FEM (TUBING) IMPLANT
TUBING CIL FLEX 10 FLL-RA (TUBING) ×1 IMPLANT
WATCHMAN FLX 27 (Prosthesis & Implant Heart) IMPLANT
WATCHMAN FLX PROCEDURE DEVICE (KITS) ×1 IMPLANT
WATCHMAN PROCED TRUSEAL ACCESS (SHEATH) IMPLANT

## 2022-07-26 NOTE — Progress Notes (Signed)
  [facility] TEAM  Patient doing well s/p Watchman implant. He is hemodynamically stable. Groin site remains stable. ECG with AF. He has done well in the post procedure setting and is therefore being considered for same day discharge 07/26/22. Plan for early ambulation after bedrest completed and hopeful discharge later today.   Kathyrn Drown NP-C Structural Heart Team  Pager: [phone] Phone: [phone]

## 2022-07-26 NOTE — H&P (Signed)
Electrophysiology Office Note:     Date:  07/26/2022    ID:  Vernon [name], DOB Aug 22, 1946, MRN [id number]   PCP:  Vernon Hire, MD         [name] Cardiologist:  None  [facility] Electrophysiologist:  Vernon Epley, MD    Referring MD: Vernon Hire, MD    Chief Complaint: New patient consult for Watchman   History of Present Illness:     Vernon [name] is a 76 y.o. male who presents for an evaluation for Watchman at the request of Dr. Nehemiah [name]. Their medical history includes aortic stenosis, hypertension, anemia, myelodysplastic syndrome, diabetes, and GERD.   He was hospitalized in 01/2022 for intracranial hemorrhage requiring surgical evacuation. Due to his chronic atrial fibrillation he was restarted on Eliquis. He was in the hospital for 5.5 weeks completing in-house rehab.   On 05/17/22 he expressed concern with bleeding risks associated with Eliquis and wished to discuss alternatives to anticoagulation. He was referred to EP for further consideration and discussion of the Watchman procedure.   Today, he states he is feeling pretty good.    He is scheduled to follow up with Dr. Nehemiah [name] in October.   They deny any palpitations, chest pain, shortness of breath, or peripheral edema. No lightheadedness, headaches, syncope, orthopnea, or PND.  Today he presents for LAAO.        Objective      Past Medical History:  Diagnosis Date   Anemia     Aortic stenosis     Arthritis     Complication of anesthesia      hard time getting bp up after knee replacement   Diabetes mellitus without complication (HCC)     GERD (gastroesophageal reflux disease)      occ tums prn   History of hiatal hernia     Hypertension     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[place])             Past Surgical History:  Procedure Laterality Date   CARPAL TUNNEL RELEASE   2012   CRANIOTOMY N/A 02/10/2022    Procedure: SUBOCCIPITAL CRANIECTOMY FOR EVACUATION OF CEREBELLAR HEMATOMA;   Surgeon: Kristeen Miss, MD;  Location: [facility];  Service: Neurosurgery;  Laterality: N/A;   JOINT REPLACEMENT Right 2010   SHOULDER ARTHROSCOPY WITH ROTATOR CUFF REPAIR AND OPEN BICEPS TENODESIS Right 11/09/2019    Procedure: RIGHT SHOULDER ARTHROSCOPY WITH SUBSCAPULARIS REPAIR, SUBACROMIAL DECOMPRESSION,MINI OPEN ROTATOR CUFF REPAIR;  Surgeon: Leim Fabry, MD;  Location: [facility];  Service: Orthopedics;  Laterality: Right;      Current Medications: Active Medications      Current Meds  Medication Sig   apixaban (ELIQUIS) 5 MG TABS tablet Take 5 mg by mouth 2 (two) times daily.    metFORMIN (GLUCOPHAGE) 500 MG tablet Take 1 tablet by mouth 2 (two) times daily with a meal.        Allergies:   Patient has no known allergies.    Social History         Socioeconomic History   Marital status: Married      Spouse name: Not on file   Number of children: Not on file   Years of education: Not on file   Highest education level: Not on file  Occupational History   Not on file  Tobacco Use   Smoking status: Former      Packs/day: 0.50      Years: 8.00  Total pack years: 4.00      Types: Cigarettes      Quit date: 07/21/1978      Years since quitting: 43.9   Smokeless tobacco: Never  Vaping Use   Vaping Use: Never used  Substance and Sexual Activity   Alcohol use: Yes      Alcohol/week: 0.0 - 1.0 standard drinks of alcohol      Comment: rare beer   Drug use: Never   Sexual activity: Not on file  Other Topics Concern   Not on file  Social History Narrative    Lives in [place]; with wife; quit smoking in early 33s; ocassional/ rare [may be 1 a month beer]. 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     Social Determinants of Health    Financial Resource Strain: Not on file  Food Insecurity: Not on file  Transportation Needs: Not on file  Physical Activity: Not on file  Stress: Not on file  Social Connections: Not on file      Family History: The patient's family  history includes Cancer in his maternal uncle.   ROS:   Please see the history of present illness.      All other systems reviewed and are negative.   EKGs/Labs/Other Studies Reviewed:     The following studies were reviewed today:   02/19/2022  Left UE Venous Doppler: Summary:     Right:  No evidence of thrombosis in the subclavian.     Left:  No evidence of deep vein thrombosis in the upper extremity. No evidence of superficial vein thrombosis in the upper extremity.    02/11/2022  Echocardiogram: Sonographer Comments: Echo performed with patient supine and on artificial  respirator.  IMPRESSIONS     1. Left ventricular ejection fraction, by estimation, is 55 to 60%. The  left ventricle has normal function. The left ventricle has no regional  wall motion abnormalities. There is moderate left ventricular hypertrophy.  Left ventricular diastolic  parameters are indeterminate.   2. Right ventricular systolic function is normal. The right ventricular  size is normal. There is moderately elevated pulmonary artery systolic  pressure.   3. Left atrial size was severely dilated.   4. Right atrial size was mild to moderately dilated.   5. A small pericardial effusion is present. There is no evidence of  cardiac tamponade.   6. Mild mitral valve regurgitation.   7. There is moderate calcification of the aortic valve. Aortic valve  regurgitation is not visualized. Mild aortic valve stenosis. Aortic valve  area, by VTI measures 1.03 cm. Aortic valve mean gradient measures 19.0  mmHg.   8. The inferior vena cava is dilated in size with <50% respiratory  variability, suggesting right atrial pressure of 15 mmHg.      EKG:   EKG is personally reviewed.      Recent Labs: 02/12/2022: B Natriuretic Peptide 854.2 02/21/2022: Magnesium 1.9 02/27/2022: ALT 24 04/04/2022: BUN 65; Creatinine, Ser 1.47; Platelets 313; Potassium 4.4; Sodium 134 06/13/2022: Hemoglobin 8.9    Recent Lipid  Panel Labs (Brief)          Component Value Date/Time    CHOL 68 02/11/2022 0423    TRIG 46 02/14/2022 0450    HDL 27 (L) 02/11/2022 0423    CHOLHDL 2.5 02/11/2022 0423    VLDL 16 02/11/2022 0423    LDLCALC 25 02/11/2022 0423        Physical Exam:     VS:  BP 178/88  Pulse 72   Ht '5\' 9"'$  (1.753 m)   Wt 202 lb (91.6 kg)   BMI 29.83 kg/m         Wt Readings from Last 3 Encounters:  06/21/22 202 lb (91.6 kg)  06/07/22 205 lb (93 kg)  04/04/22 193 lb (87.5 kg)      GEN: Well nourished, well developed in no acute distress HEENT: Normal NECK: No JVD; No carotid bruits LYMPHATICS: No lymphadenopathy CARDIAC: RRR, 2 out of 6 crescendo decrescendo systolic ejection murmur at the upper sternal borders.  No rubs, gallops RESPIRATORY:  Clear to auscultation without rales, wheezing or rhonchi  ABDOMEN: Soft, non-tender, non-distended MUSCULOSKELETAL:  No edema; No deformity  SKIN: Warm and dry NEUROLOGIC:  Alert and oriented x 3 PSYCHIATRIC:  Normal affect          Assessment ASSESSMENT:     1. Atrial fibrillation, chronic (Vernon [name])   2. 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 hemorrhage of brain (HCC)     PLAN:     In order of problems listed above:   #Chronic atrial fibrillation Back on Eliquis for stroke prophylaxis.  History of intracranial hemorrhage in March 2023 requiring craniectomy.  He has done remarkably well with rehab and is interested in pursuing left atrial appendage occlusion as a mechanism to avoid long-term exposure to anticoagulation.  I think he is overall a great candidate for the procedure.   I have seen [name] in the office today who is being considered for a Watchman left atrial appendage closure device. I believe they will benefit from this procedure given their history of atrial fibrillation, CHA2DS2-VASc score of at least 4 and unadjusted ischemic stroke rate of at least 4.8% per year. Unfortunately, the patient is not felt to be a long term anticoagulation  candidate secondary to a history of intracranial hemorrhage. The patient's chart has been reviewed and I feel that they would be a candidate for short term oral anticoagulation after Watchman implant.    It is my belief that after undergoing a LAA closure procedure, Vernon [name] will not need long term anticoagulation which eliminates anticoagulation side effects and major bleeding risk.    Procedural risks for the Watchman implant have been reviewed with the patient including a 0.5% risk of stroke, <1% risk of perforation and <1% risk of device embolization. Other risks include bleeding, vascular damage, tamponade, worsening renal function, and death. The patient understands these risk and wishes to proceed.       The published clinical data on the safety and effectiveness of WATCHMAN include but are not limited to the following: - Holmes DR, Mechele Claude, Sick P et al. for the PROTECT AF Investigators. Percutaneous closure of the left atrial appendage versus warfarin therapy for prevention of stroke in patients with atrial fibrillation: a randomised non-inferiority trial. Lancet 2009; 374: 534-42. Mechele Claude, Doshi SK, Abelardo Diesel D et al. on behalf of the PROTECT AF Investigators. Percutaneous Left Atrial Appendage Closure for Stroke Prophylaxis in Patients With Atrial Fibrillation 2.3-Year Follow-up of the PROTECT AF (Watchman Left Atrial Appendage System for Embolic Protection in Patients With Atrial Fibrillation) Trial. Circulation 2013; 127:720-729. - Alli O, Doshi S,  Kar S, Reddy VY, Sievert H et al. Quality of Life Assessment in the Randomized PROTECT AF (Percutaneous Closure of the Left Atrial Appendage Versus Warfarin Therapy for Prevention of Stroke in Patients With Atrial Fibrillation) Trial of Patients at Risk for Stroke With Nonvalvular Atrial Fibrillation. J Am Coll Cardiol 2013; 61:1790-8. -  Holmes DR, Tarri Abernethy, Price M, Whisenant B, Sievert H, Doshi S, Geanie Berlin V. Prospective  randomized evaluation of the Watchman left atrial appendage Device in patients with atrial fibrillation versus long-term warfarin therapy; the PREVAIL trial. Journal of the [employer] of Cardiology, Vol. 4, No. 1, 2014, 1-11. - Kar S, Doshi SK, Sadhu A, Horton R, Osorio J et al. Primary outcome evaluation of a next-generation left atrial appendage closure device: results from the PINNACLE FLX trial. Circulation 2021;143(18)1754-1762.      After today's visit with the patient which was dedicated solely for shared decision making visit regarding LAA closure device, the patient decided to proceed with the LAA appendage closure procedure scheduled to be done in the near future at [facility]. Prior to the procedure, I would like to obtain a gated CT scan of the chest with contrast timed for PV/LA visualization.      HAS-BLED score 3 Hypertension Yes  Abnormal renal and liver function (Dialysis, transplant, Cr >2.26 mg/dL /Cirrhosis or Bilirubin >2x Normal or AST/ALT/AP >3x Normal) No  Stroke No  Bleeding Yes  Labile INR (Unstable/high INR) No  Elderly (>65) Yes  Drugs or alcohol (? 8 drinks/week, anti-plt or NSAID) No    CHA2DS2-VASc Score = 4  The patient's score is based upon: CHF History: 0 HTN History: 1 Diabetes History: 1 Stroke History: 0 Vascular Disease History: 0 Age Score: 2 Gender Score: 0      Mr Lunn presents for LAAO today with TEE+/-ICE imaging. Procedure reviewed.  Lysbeth Galas T. Quentin Ore, MD, [facility], [facility] Cardiac Electrophysiology

## 2022-07-26 NOTE — Transfer of Care (Addendum)
Immediate Anesthesia Transfer of Care Note  Patient: Vernon [name]  Procedure(s) Performed: LEFT ATRIAL APPENDAGE OCCLUSION TRANSESOPHAGEAL ECHOCARDIOGRAM (TEE)  Patient Location: PACU  Anesthesia Type:General  Level of Consciousness: drowsy and patient cooperative  Airway & Oxygen Therapy: Patient Spontanous Breathing and Patient connected to nasal cannula oxygen  Post-op Assessment: Report given to RN, Post -op Vital signs reviewed and stable and Patient moving all extremities X 4  Post vital signs: Reviewed and stable  Last Vitals:  Vitals Value Taken Time  BP 124/67 07/26/22 0938  Temp    Pulse 56 07/26/22 0942  Resp 19 07/26/22 0942  SpO2 93 % 07/26/22 0942  Vitals shown include unvalidated device data.  Last Pain:  Vitals:   07/26/22 0603  TempSrc:   PainSc: 0-No pain      Patients Stated Pain Goal: 0 (12/75/17 0017)  Complications: There were no known notable events for this encounter.

## 2022-07-26 NOTE — Progress Notes (Signed)
Mobility Specialist Progress Note    07/26/22 1618  Mobility  Activity Ambulated with assistance in hallway  Level of Assistance Contact guard assist, steadying assist  Assistive Device Front wheel walker  Distance Ambulated (ft) 100 ft  Activity Response Tolerated well  $Mobility charge 1 Mobility   Pt received sitting EOB and agreeable. No complaints on walk. Returned to sitting EOB with call bell in reach.    Hildred Alamin Mobility Specialist

## 2022-07-26 NOTE — Anesthesia Procedure Notes (Signed)
Procedure Name: Intubation Date/Time: 07/26/2022 8:06 AM  Performed by: Darletta Moll, CRNAPre-anesthesia Checklist: Patient identified, Emergency Drugs available, Suction available and Patient being monitored Patient Re-evaluated:Patient Re-evaluated prior to induction Oxygen Delivery Method: Circle system utilized Preoxygenation: Pre-oxygenation with 100% oxygen Induction Type: IV induction Ventilation: Mask ventilation without difficulty Laryngoscope Size: Mac and 4 Grade View: Grade I Tube type: Oral Tube size: 7.5 mm Number of attempts: 1 Airway Equipment and Method: Stylet and Oral airway Placement Confirmation: ETT inserted through vocal cords under direct vision, positive ETCO2 and breath sounds checked- equal and bilateral Secured at: 22 cm Tube secured with: Tape Dental Injury: Teeth and Oropharynx as per pre-operative assessment

## 2022-07-26 NOTE — Anesthesia Procedure Notes (Signed)
Arterial Line Insertion Start/End8/31/2023 7:32 AM, 07/26/2022 7:32 AM Performed by: Nolon Nations, MD, Betha Loa, CRNA, CRNA  Patient location: Pre-op. Preanesthetic checklist: patient identified, IV checked, site marked, risks and benefits discussed, surgical consent, monitors and equipment checked, pre-op evaluation, timeout performed and anesthesia consent Lidocaine 1% used for infiltration Left, radial was placed Catheter size: 20 G Hand hygiene performed  and maximum sterile barriers used   Attempts: 1 Procedure performed without using ultrasound guided technique. Following insertion, dressing applied and Biopatch. Post procedure assessment: normal and unchanged  Patient tolerated the procedure well with no immediate complications.

## 2022-07-26 NOTE — Plan of Care (Signed)

## 2022-07-26 NOTE — Progress Notes (Signed)
  Echocardiogram Echocardiogram Transesophageal has been performed.  Vernon [name] 07/26/2022, 9:33 AM

## 2022-07-26 NOTE — Discharge Summary (Signed)
[facility]    Patient ID: Vernon [name],  MRN: [id number], DOB/AGE: 1946/06/28 76 y.o.  Admit date: 07/26/2022 Discharge date: 07/26/2022  Primary Care Physician: Baxter Hire, MD  Primary Cardiologist: None  Electrophysiologist: Vickie Epley, MD  Primary Discharge Diagnosis:  Permanent Atrial Fibrillation Poor candidacy for long term anticoagulation due to h/o intracranial hemorrhage  Secondary Discharge Diagnosis:  -DM2 -GERD -MDS -HTN -AS  Procedures This Admission:  Transeptal Puncture Intra-procedural TEE which showed no LAA thrombus Left atrial appendage occlusive device placement on 07/26/22 by Dr. Quentin Ore.   Brief HPI: Vernon [name] is a 76 y.o. male with a history of DM2, GERD, MDS, HTN, aortic stenosis, and atrial fibrillation complication by bleeding while on anticoagulation who was recently referred to Dr. Quentin Ore for the evaluation of Watchman implantation.    Vernon [name] presented to [facility]/10/22 with left-sided weakness and incoordination as well as nausea vomiting. 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 Eliquis was reversed and he ultimately underwent Patient suboccipital craniectomy with evacuation of cerebellar hemorrhage and placement of right parieto-occipital ventriculostomy 02/11/2022 per Dr. Ellene Route.    He did well post operatively and was discharged to a SNF. He is now back at home and he continues to participate with OP PT twice weekly.    He continued to express concern with bleeding risks associated with Eliquis and wished to discuss alternatives to anticoagulation. He was referred to EP for further consideration and discussion of the Watchman procedure. He underwent pre Watchman imaging that showed anatomy suitable for implant.    Hospital Course:  The patient was admitted and underwent left atrial appendage occlusive  device placement with 2mm Watchman FLX device.  He  was monitored on telemetry post procedure which demonstrated atrial fibrillation. Groin site has been without complication. The patient was examined and considered to be stable for discharge today 07/26/22.  Wound care and restrictions were reviewed with the patient. Medication plan will be to restart Eliquis however at half dose, 2.$RemoveBefo'5mg'avwlVSjXUSE$  BID for 45 days, then the patient will stop Eliquis and start ASA $RemoveB'81mg'loChtzGq$  QD monotherapy.  A repeat CT will be performed at approximately 60 days to ensure proper seal of the device.    Permanent atrial fibrillation: Patient with hx of intracranial bleeding while on anticoagulation who was seen by Dr. Quentin Ore for Harrington implantation. He is now s/p Watchman implant with 2mm FLX device. Plan low dose Eliquis for 45 days then he will transition to ASA $Remo'81mg'kCMkN$  monotherapy. He will require 6 months of SBE prophylaxis. Needs to defer dental cleanings and procedures for 1 month. Will RX Amoxicillin at one month follow up.    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  No new neuro changes. Restarting los dose Eliquis 2.$RemoveBefore'5mg'aGfEAjFtRQJnh$ .    HTN: Continue current regimen    Physical Exam: Vitals:   07/26/22 1200 07/26/22 1230 07/26/22 1300 07/26/22 1400  BP: 122/65 125/66 127/74 113/70  Pulse: 67 66 69 73  Resp: (!) $RemoveB'22 20 20 19  'TPsKTEMh$ Temp:      TempSrc:      SpO2: 92% 92% 90% 90%  Weight:      Height:       Labs:   Lab Results  Component Value Date   WBC 5.7 07/23/2022   HGB 9.2 (L) 07/25/2022   HCT 28.6 (L) 07/25/2022   MCV 96 07/23/2022  PLT 282 07/23/2022    Recent Labs  Lab 07/23/22 1340  NA 142  K 4.6  CL 107*  CO2 27  BUN 33*  CREATININE 1.41*  CALCIUM 10.0  GLUCOSE 151*    Discharge Medications:  Allergies as of 07/26/2022   No Known Allergies      Medication List     TAKE these medications    Accu-Chek Guide test strip Generic drug: glucose blood USE 1 STRIP 3 TIMES A DAY AS INSTRUCTED    acetaminophen 650 MG CR tablet Commonly known as: TYLENOL Take 650 mg by mouth every 8 (eight) hours as needed for pain.   allopurinol 100 MG tablet Commonly known as: ZYLOPRIM Take 100 mg by mouth daily as needed (gout).   blood glucose meter kit and supplies Kit Dispense based on patient and insurance preference. Use up to four times daily as directed.   CareFine Pen Needles 32G X 5 MM Misc Generic drug: Insulin Pen Needle 1 application. by Does not apply route daily after breakfast.   Eliquis 2.5 MG Tabs tablet Generic drug: apixaban Take 1 tablet (2.5 mg total) by mouth 2 (two) times daily. What changed:  medication strength how much to take   metFORMIN 500 MG tablet Commonly known as: GLUCOPHAGE Take 500 mg by mouth 2 (two) times daily with a meal.   polyethylene glycol 17 g packet Commonly known as: MIRALAX / GLYCOLAX Take 17 g by mouth daily. What changed:  when to take this reasons to take this        Disposition:  Home  Discharge Instructions     Call MD for:  difficulty breathing, headache or visual disturbances   Complete by: As directed    Call MD for:  extreme fatigue   Complete by: As directed    Call MD for:  hives   Complete by: As directed    Call MD for:  persistant dizziness or light-headedness   Complete by: As directed    Call MD for:  persistant nausea and vomiting   Complete by: As directed    Call MD for:  redness, tenderness, or signs of infection (pain, swelling, redness, odor or green/yellow discharge around incision site)   Complete by: As directed    Call MD for:  severe uncontrolled pain   Complete by: As directed    Call MD for:  temperature >100.4   Complete by: As directed    Diet - low sodium heart healthy   Complete by: As directed    Discharge instructions   Complete by: As directed    [facility] Procedure, Care After  Procedure MD: Dr. Benson Norway Clinical Coordinator: Lenice Llamas, RN  This sheet gives you  information about how to care for yourself after your procedure. Your health care provider may also give you more specific instructions. If you have problems or questions, contact your health care provider.  What can I expect after the procedure? After the procedure, it is common to have: Bruising around your puncture site. Tenderness around your puncture site. Tiredness (fatigue).  Medication instructions It is very important to continue to take your blood thinner as directed by your doctor after the Watchman procedure. Call your procedure doctor's office with question or concerns. If you are on Coumadin (warfarin), you will have your INR checked the week after your procedure, with a goal INR of 2.0 - 3.0. Please follow your medication instructions on your discharge summary. Only take the medications listed on your  discharge paperwork.  Follow up You will be seen in 1 month after your procedure  You will have another CT approximately 8 weeks after your procedure mark to check your device You will follow up the MD/APP who performed your procedure 6 months after your procedure The Watchman Clinical Coordinator will check in with you from time to time, including 1 and 2 years after your procedure.    Follow these instructions at home: Puncture site care  Follow instructions from your health care provider about how to take care of your puncture site. Make sure you: If present, leave stitches (sutures), skin glue, or adhesive strips in place.  If a large square bandage is present, this may be removed 24 hours after surgery.  Check your puncture site every day for signs of infection. Check for: Redness, swelling, or pain. Fluid or blood. If your puncture site starts to bleed, lie down on your back, apply firm pressure to the area, and contact your health care provider. Warmth. Pus or a bad smell. Driving Do not drive yourself home if you received sedation Do not drive for at least 4 days  after your procedure or however long your health care provider recommends. (Do not resume driving if you have previously been instructed not to drive for other health reasons.) Do not spend greater than 1 hour at a time in a car for the first 3 days. Stop and take a break with a 5 minute walk at least every hour.  Do not drive or use heavy machinery while taking prescription pain medicine.  Activity Avoid activities that take a lot of effort, including exercise, for at least 7 days after your procedure. For the first 3 days, avoid sitting for longer than one hour at a time.  Avoid alcoholic beverages, signing paperwork, or participating in legal proceedings for 24 hours after receiving sedation Do not lift anything that is heavier than 10 lb (4.5 kg) for one week.  No sexual activity for 1 week.  Return to your normal activities as told by your health care provider. Ask your health care provider what activities are safe for you. General instructions Take over-the-counter and prescription medicines only as told by your health care provider. Do not use any products that contain nicotine or tobacco, such as cigarettes and e-cigarettes. If you need help quitting, ask your health care provider. You may shower after 24 hours, but Do not take baths, swim, or use a hot tub for 1 week.  Do not drink alcohol for 24 hours after your procedure. Keep all follow-up visits as told by your health care provider. This is important. Dental Work: You will require antibiotics prior to any dental work, including cleanings, for 6 months after your Watchman implantation to help protect you from infection. After 6 months, antibiotics are no longer required. Contact a health care provider if: You have redness, mild swelling, or pain around your puncture site. You have soreness in your throat or at your puncture site that does not improve after several days You have fluid or blood coming from your puncture site that stops  after applying firm pressure to the area. Your puncture site feels warm to the touch. You have pus or a bad smell coming from your puncture site. You have a fever. You have chest pain or discomfort that spreads to your neck, jaw, or arm. You are sweating a lot. You feel nauseous. You have a fast or irregular heartbeat. You have shortness of breath. You  are dizzy or light-headed and feel the need to lie down. You have pain or numbness in the arm or leg closest to your puncture site. Get help right away if: Your puncture site suddenly swells. Your puncture site is bleeding and the bleeding does not stop after applying firm pressure to the area. These symptoms may represent a serious problem that is an emergency. Do not wait to see if the symptoms will go away. Get medical help right away. Call your local emergency services (911 in the [place] Do not drive yourself to the hospital. Summary After the procedure, it is normal to have bruising and tenderness at the puncture site in your groin, neck, or forearm. Check your puncture site every day for signs of infection. Get help right away if your puncture site is bleeding and the bleeding does not stop after applying firm pressure to the area. This is a medical emergency.  This information is not intended to replace advice given to you by your health care provider. Make sure you discuss any questions you have with your health care provider.   Increase activity slowly   Complete by: As directed        Follow-up Information     Tommie Raymond, NP Follow up on 08/27/2022.   Specialty: Cardiology Why: @ 1230. Please arrive at 12:15pm Contact information: [address] [place] [address] [phone]                 Duration of Discharge Encounter: Greater than 30 minutes including physician time.  Signed, Kathyrn Drown, NP  07/26/2022 5:03 PM

## 2022-07-26 NOTE — Anesthesia Postprocedure Evaluation (Signed)
Anesthesia Post Note  Patient: Vernon [name]  Procedure(s) Performed: LEFT ATRIAL APPENDAGE OCCLUSION TRANSESOPHAGEAL ECHOCARDIOGRAM (TEE)     Patient location during evaluation: PACU Anesthesia Type: General Level of consciousness: sedated and patient cooperative Pain management: pain level controlled Vital Signs Assessment: post-procedure vital signs reviewed and stable Respiratory status: spontaneous breathing Cardiovascular status: stable Anesthetic complications: no   There were no known notable events for this encounter.  Last Vitals:  Vitals:   07/26/22 1300 07/26/22 1400  BP: 127/74 113/70  Pulse: 69 73  Resp: 20 19  Temp:    SpO2: 90% 90%    Last Pain:  Vitals:   07/26/22 1100  TempSrc: Oral  PainSc:                  Nolon Nations

## 2022-07-27 ENCOUNTER — Encounter (HOSPITAL_COMMUNITY): Payer: Self-pay | Admitting: Cardiology

## 2022-07-27 ENCOUNTER — Telehealth: Payer: Self-pay | Admitting: Cardiology

## 2022-07-27 NOTE — Telephone Encounter (Signed)
  [facility] TEAM   Patient contacted regarding discharge from [facility] on 07/26/22   Patient understands to follow up with provider Kathyrn Drown, NP-C at the [facility] Patient understands discharge instructions? Yes  Patient understands medications and regimen? Yes  Patient understands to bring all medications to this visit? Yes   Kathyrn Drown NP-C Structural [facility] Team  Pager: [phone]

## 2022-07-30 MED FILL — Calcium Chloride Inj 10%: INTRAVENOUS | Qty: 10 | Status: AC

## 2022-07-30 MED FILL — Fentanyl Citrate Preservative Free (PF) Inj 100 MCG/2ML: INTRAMUSCULAR | Qty: 2 | Status: AC

## 2022-08-01 ENCOUNTER — Ambulatory Visit: Payer: Medicare Other | Attending: Internal Medicine | Admitting: Physical Therapy

## 2022-08-01 ENCOUNTER — Encounter: Payer: Self-pay | Admitting: Physical Therapy

## 2022-08-01 ENCOUNTER — Ambulatory Visit: Payer: Medicare Other | Admitting: Occupational Therapy

## 2022-08-01 DIAGNOSIS — I69254 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: Secondary | ICD-10-CM | POA: Diagnosis present

## 2022-08-01 DIAGNOSIS — R2689 Other abnormalities of gait and mobility: Secondary | ICD-10-CM | POA: Insufficient documentation

## 2022-08-01 DIAGNOSIS — R278 Other lack of coordination: Secondary | ICD-10-CM

## 2022-08-01 DIAGNOSIS — M6281 Muscle weakness (generalized): Secondary | ICD-10-CM

## 2022-08-01 DIAGNOSIS — R2681 Unsteadiness on feet: Secondary | ICD-10-CM | POA: Diagnosis present

## 2022-08-01 DIAGNOSIS — R471 Dysarthria and anarthria: Secondary | ICD-10-CM | POA: Diagnosis present

## 2022-08-01 DIAGNOSIS — R262 Difficulty in walking, not elsewhere classified: Secondary | ICD-10-CM | POA: Insufficient documentation

## 2022-08-01 NOTE — Therapy (Signed)
OUTPATIENT PHYSICAL THERAPY TREATMENT  Patient Name: Vernon [name] MRN: [id number] DOB:01-01-1946, 76 y.o., male Today's Date: 08/01/2022  PCP: Baxter Hire, MD  REFERRING PROVIDER: Baxter Hire, MD        END OF SESSION:   PT End of Session - 08/01/22 1403     Visit Number 22    Number of Visits 27    Date for PT Re-Evaluation 08/13/22    Authorization Type UHC Medicare    Progress Note Due on Visit 29    PT Start Time 1404    PT Stop Time 1442    PT Time Calculation (min) 38 min    Equipment Utilized During Treatment Gait belt    Activity Tolerance Patient tolerated treatment well   acute L knee pain   Behavior During Therapy WFL for tasks assessed/performed;Impulsive                      Past Medical History:  Diagnosis Date   Anemia    Aortic stenosis    Arthritis    Complication of anesthesia    hard time getting bp up after knee replacement   Diabetes mellitus without complication (HCC)    GERD (gastroesophageal reflux disease)    occ tums prn   History of hiatal hernia    Hypertension    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[place])    Presence of Watchman left atrial appendage closure device 07/26/2022   66m Watchman FLX with Dr. 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;  Surgeon: EKristeen Miss MD;  [facility]  Service: Neurosurgery;  Laterality: N/A;   JOINT REPLACEMENT Right 2010   LEFT ATRIAL APPENDAGE OCCLUSION N/A 07/26/2022   Procedure: LEFT ATRIAL APPENDAGE OCCLUSION;  Surgeon: LVickie Epley MD;  [facility];  Service: Cardiovascular;  Laterality: N/A;   SHOULDER ARTHROSCOPY WITH ROTATOR CUFF REPAIR AND OPEN BICEPS TENODESIS Right 11/09/2019   Procedure: RIGHT SHOULDER ARTHROSCOPY WITH SUBSCAPULARIS REPAIR, SUBACROMIAL DECOMPRESSION,MINI OPEN ROTATOR CUFF  REPAIR;  Surgeon: PLeim Fabry MD;  Location: [facility];  Service: Orthopedics;  Laterality: Right;   TEE WITHOUT CARDIOVERSION N/A 07/26/2022   Procedure: TRANSESOPHAGEAL ECHOCARDIOGRAM (TEE);  Surgeon: LVickie Epley MD;  [facility];  Service: Cardiovascular;  Laterality: N/A;   Patient Active Problem List   Diagnosis Date Noted   Atrial fibrillation ([place] 07/26/2022   Presence of Watchman left atrial appendage closure device 07/26/2022   Proteinuria, unspecified 05/23/2022   Simple renal cyst 05/23/2022   Long term current use of anticoagulant 04/04/2022   Rotator cuff syndrome 04/04/2022   Exposure to potentially hazardous substance 04/04/2022   Gout 04/04/2022   Osteoarthritis 04/04/2022   Osteoporosis 04/04/2022   Other specified diseases of hair and hair follicles 095/07/3266  Pain in joint, lower leg 04/04/2022   Problem related to unspecified psychosocial circumstances 04/04/2022   General medical examination for administrative purposes 04/04/2022   Stage 3b chronic kidney disease ([employer]    Dysphagia, post-stroke    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 hemorrhage of brain ([place] 02/26/2022   Cough 012/45/8099  Acute metabolic encephalopathy 083/38/2505  Obstructive hydrocephalus ([place] 02/19/2022   Dyslipidemia 02/19/2022   Dysphagia 02/19/2022   AKI (acute kidney injury) ([place]    Hyperkalemia    Anemia    Pleural effusion on right  Respiratory failure requiring intubation (HCC)    Cerebral edema (HCC)    Chronic atrial fibrillation (HCC)    Controlled type 2 diabetes mellitus with hyperglycemia, without long-term current use of insulin (HCC)    ICH (intracerebral hemorrhage) ([place]) 02/10/2022   Intracranial hemorrhage (HCC)    Anticoagulated    Moderate tricuspid regurgitation 07/26/2021   Moderate aortic valve stenosis 03/08/2020   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[place]) 02/18/2020   Macrocytic anemia 01/13/2020   Spleen enlargement 01/13/2020   Bilateral carotid  artery stenosis 07/14/2018   Atrial fibrillation, chronic ([place]) 07/14/2018   Type 2 diabetes with nephropathy ([place]) 02/14/2016   Essential hypertension 08/16/2014   Hyperlipemia, mixed 08/16/2014   Renal insufficiency 08/16/2014    REFERRING DIAG: I62.9 (ICD-10-CM) - Nontraumatic intracranial hemorrhage, unspecified   THERAPY DIAG:  Unsteadiness on feet  Muscle weakness (generalized)  Other abnormalities of gait and mobility  Rationale for Evaluation and Treatment Rehabilitation  PERTINENT HISTORY: see MAR  PRECAUTIONS: fall risk  SUBJECTIVE: Had the Watchman procedure done last Thursday, so I've bee recovering.  Feel the procedure was a success.  Per MD and instructions, avoid heavy lifting "for a while" (to follow up with MD 9/17)  PAIN:  Are you having pain? Yes: NPRS scale: 4/10 Pain location: L knee-inferior aspect and medial to knee Pain description: aching, throbbing Aggravating factors: rotating in and out of foot Relieving factors: arthritis medication    OBJECTIVE:   Pt able to discuss recent Watchman procedure for his A-Fib, and he is able to verbalize precautions of avoiding heavy lifting and movements that would affect bilateral groin area.    TODAY'S TREATMENT: 08/01/2022 Activity Comments  Gait x 85 ft with RW with min guard assist Verbal and tactile cues for upright posture   Vitals:  166/85 HR 60   Gait 85 ft x 3 reps with RW with min guard/min assist Cues to slow at turns and cues for upright posture  Gait focusing on turns, figure-8 walking around hurdle obstacles, with RW, 2 reps Cues for slowed pace and maintaining wider BOS  In parallel bars:   standing feet apart EO and EC 15 seconds on solid surface Feet apart EO and EC on Airex with holding EC 4, sec, 8 sec, then 15 sec with UE support On Airex:  lateral weightshifting 10 reps with light UE support, EO and EC Arms by sides, cues to relax elevated shoulders  Return to sit from standing/gait with  consistent cues and min guard, multiple reps through session Pt tends to leave walker and have decreased eccentric control.  Better through session with cues and repetition.     HOME EXERCISE PROGRAM Last updated: 07/03/22 Access Code: [email or web address] URL: [email or web address] Date: 07/02/2022 Prepared by: [facility]  Exercises - Seated Hip Abduction  - 1 x daily - 5 x weekly - 2 sets - 10 reps - Seated Long Arc Quad  - 1 x daily - 5 x weekly - 2 sets - 10 reps - Sit to Stand with Counter Support  - 1 x daily - 5 x weekly - 2 sets - 10 reps - Standing Hip Abduction with Counter Support  - 1 x daily - 5 x weekly - 2 sets - 10 reps - Standing Alternating Knee Flexion with Ankle Weights  - 1 x daily - 5 x weekly - 2 sets - 10 reps   -------------------------------------------------------------------------------------------------------------------------   From eval  DIAGNOSTIC FINDINGS: see MAR  COGNITION: Overall cognitive status: Within functional limits for tasks assessed             SENSATION: WFL   COORDINATION: Bradykinesia, difficulty wit rapid alternating movements     MUSCLE TONE: WFL       POSTURE: No Significant postural limitations   LOWER EXTREMITY ROM:      WFL  (Blank rows = not tested)   LOWER EXTREMITY MMT:     MMT Right Eval Left Eval  Hip flexion 5 4  Hip extension 5 3+  Hip abduction 5 3-  Hip adduction      Hip internal rotation      Hip external rotation      Knee flexion 5 4  Knee extension 5 4  Ankle dorsiflexion 5 4+  Ankle plantarflexion      Ankle inversion      Ankle eversion      (Blank rows = not tested)   BED MOBILITY:  Independent   TRANSFERS: Assistive device utilized: Environmental consultant - 2 wheeled  Sit to stand: Modified independence Stand to sit: Modified independence Chair to chair: SBA and CGA Floor:  DNT   RAMP:  Not available   CURB:  Level of Assistance: SBA and  CGA Assistive device utilized: Environmental consultant - 2 wheeled Curb Comments: undershoots with LLE   STAIRS:           Level of Assistance: SBA and CGA           Stair Negotiation Technique: Alternating Pattern  with Bilateral Rails           Number of Stairs: 6             Height of Stairs: 4-6"            Comments: dysmetria w/ LLE   GAIT: Gait pattern: circumduction- Left and ataxic Distance walked: 100 Assistive device utilized: Walker - 2 wheeled Level of assistance: SBA Comments: dysmetria LLE   FUNCTIONAL TESTs:  Timed up and go (TUG): 20.25 with RW Berg Balance Scale: 35/56      M-CTSIB  Condition 1: Firm Surface, EO 30 Sec, Mild Sway  Condition 2: Firm Surface, EC 30 Sec, Mild and Moderate Sway  Condition 3: Foam Surface, EO 9 Sec, Severe Sway  Condition 4: Foam Surface, EC 0 Sec,  unable  Sway     07/02/22 M-CTSIB  Condition 1: Firm Surface, EO 30 Sec, Mild and Moderate Sway  Condition 2: Firm Surface, EC 30 Sec, Moderate Sway * required RW support to get into romberg  Condition 3: Foam Surface, EO 30 Sec, Severe Sway * required RW support to get into romberg  Condition 4: Foam Surface, EC NT Sec, NT Sway          PATIENT SURVEYS:  FOTO 54.43% functional status   TODAY'S TREATMENT:  Evaluation, sidestepping along counter, tandem stance at counter     PATIENT EDUCATION: Education details: scope of PT intervention Person educated: Patient and Spouse Education method: Explanation Education comprehension: verbalized understanding     HOME EXERCISE PROGRAM: sidestepping along counter, tandem stance at counter   -----------------------------------------------------------------------------------------------------------------------    GOALS: Goals reviewed with patient? Yes   SHORT TERM GOALS: Target date: 05/29/2022   Patient will perform HEP with family/caregiver supervision for improved strength, balance, transfers, and gait  Baseline: Goal status: MET    2.  Patient will achieve 15 seconds for TUG test to manifest reduced risk for falls Baseline: 20.25 with RW; 14 sec  with RW Goal status: MET   3. Demo modified independent gait level surfaces and curb negotiation            Baseline: CGA-SBA with RW; unchanged 07/02/22            Goal status: On-going   LONG TERM GOALS: Target date: 08/13/2022   Patient will demonstrate score 45/56 Berg Balance Test to manifest low risk for falls Baseline: 35/56; 37/56 on 06/06/22; 37/56 07/02/22 Goal status: On-going   2.  Demonstrate improved static balance and postural control per time of 15 sec condition 4 M-CTSIB to improve safety with mobility on uneven surfaces Baseline: complete LOB left with condition 3; NT d/t safety 07/02/22; 8/21/203 15.66 sec, 10 sec Goal status: On-going   3.  Demo modified independent gait using least restrictive AD over various surfaces and ascend/descend stairs with set-up assist to improve functional mobility Baseline: able to navigate stairs with B handrail with CGA 07/02/22  Goal status: on-going   4.  Demo score of 57% functional status FOTO  Baseline: 54.43%; 57% 07/02/22  Goal status: MET       ASSESSMENT:   CLINICAL IMPRESSION: Pt underwent Watchman procedure for A-fib control last week; per notes and per pt report, precautions are to increase activity level slowly and to avoid heavy lifting.  Today's PT session focused mainly on bouts of gait training with RW as well as standing balance (static and lateral weigthshifting) on solid and compliant surfaces.  He continues to need cues for slowed pace with gait and with transfers and to attend to posture with gait.  He will continue to benefit from skilled PT towards goals for improved functional mobility and decreased fall risk.  OBJECTIVE IMPAIRMENTS Abnormal gait, decreased activity tolerance, decreased balance, decreased coordination, decreased endurance, decreased knowledge of use of DME, difficulty walking,  decreased strength, impaired perceived functional ability, and improper body mechanics.    ACTIVITY LIMITATIONS carrying, lifting, bending, standing, squatting, stairs, transfers, and locomotion level   PARTICIPATION LIMITATIONS: meal prep, cleaning, laundry, driving, shopping, community activity, and yard work   PERSONAL FACTORS Age, Fitness, and Time since onset of injury/illness/exacerbation are also affecting patient's functional outcome.    REHAB POTENTIAL: Excellent   CLINICAL DECISION MAKING: Evolving/moderate complexity   EVALUATION COMPLEXITY: Moderate   PLAN: PT FREQUENCY: 2x/week   PT DURATION: 6 weeks   PLANNED INTERVENTIONS: Therapeutic exercises, Therapeutic activity, Neuromuscular re-education, Balance training, Gait training, Patient/Family education, Joint mobilization, Stair training, Vestibular training, Canalith repositioning, DME instructions, Aquatic Therapy, Electrical stimulation, Wheelchair mobility training, and Manual therapy   PLAN FOR NEXT SESSION:  Ask about knee pain; Work on weigthshifting, step strategy, reaching out of BOS; work on gait, turns, balance reactions and balance on uneven surfaces; Stair ambulation. Working towards [place] (*due 9/18-so need to discuss POC next week*)            Mady Haagensen, PT 08/01/22 4:45 PM Phone: [phone] Fax: [facility] at [facility] [address] [place], [place] [address] Phone # [phone] Fax # [phone]

## 2022-08-01 NOTE — Therapy (Signed)
OUTPATIENT OCCUPATIONAL THERAPY Treatment Note   Patient Name: Vernon [name] MRN: [id number] DOB:1946-11-25, 76 y.o., male Today's Date: 08/02/2022  PCP: Baxter Hire, MD REFERRING PROVIDER: Baxter Hire, MD      OT End of Session - 08/01/22 1636     Visit Number 18    Number of Visits 25    Date for OT Re-Evaluation 08/10/22    Authorization Type UHC Medicare    Authorization Time Period VL: [place]    Progress Note Due on Visit 20    OT Start Time 1449    OT Stop Time 1531    OT Time Calculation (min) 42 min    Activity Tolerance Patient tolerated treatment well    Behavior During Therapy WFL for tasks assessed/performed                      Past Medical History:  Diagnosis Date   Anemia    Aortic stenosis    Arthritis    Complication of anesthesia    hard time getting bp up after knee replacement   Diabetes mellitus without complication (HCC)    GERD (gastroesophageal reflux disease)    occ tums prn   History of hiatal hernia    Hypertension    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[place])    Presence of Watchman left atrial appendage closure device 07/26/2022   70mm Watchman FLX with Dr. Quentin Ore   Past Surgical History:  Procedure Laterality Date   CARPAL TUNNEL RELEASE  2012   CRANIOTOMY N/A 02/10/2022   Procedure: SUBOCCIPITAL CRANIECTOMY FOR EVACUATION OF CEREBELLAR HEMATOMA;  Surgeon: Kristeen Miss, MD;  Location: [facility];  Service: Neurosurgery;  Laterality: N/A;   JOINT REPLACEMENT Right 2010   LEFT ATRIAL APPENDAGE OCCLUSION N/A 07/26/2022   Procedure: LEFT ATRIAL APPENDAGE OCCLUSION;  Surgeon: Vickie Epley, MD;  Location: [facility];  Service: Cardiovascular;  Laterality: N/A;   SHOULDER ARTHROSCOPY WITH ROTATOR CUFF REPAIR AND OPEN BICEPS TENODESIS Right 11/09/2019   Procedure: RIGHT SHOULDER ARTHROSCOPY WITH SUBSCAPULARIS REPAIR, SUBACROMIAL DECOMPRESSION,MINI OPEN ROTATOR CUFF REPAIR;  Surgeon: Leim Fabry, MD;  Location: [facility];   Service: Orthopedics;  Laterality: Right;   TEE WITHOUT CARDIOVERSION N/A 07/26/2022   Procedure: TRANSESOPHAGEAL ECHOCARDIOGRAM (TEE);  Surgeon: Vickie Epley, MD;  Location: [facility];  Service: Cardiovascular;  Laterality: N/A;   Patient Active Problem List   Diagnosis Date Noted   Atrial fibrillation ([place]) 07/26/2022   Presence of Watchman left atrial appendage closure device 07/26/2022   Proteinuria, unspecified 05/23/2022   Simple renal cyst 05/23/2022   Long term current use of anticoagulant 04/04/2022   Rotator cuff syndrome 04/04/2022   Exposure to potentially hazardous substance 04/04/2022   Gout 04/04/2022   Osteoarthritis 04/04/2022   Osteoporosis 04/04/2022   Other specified diseases of hair and hair follicles 35/36/1443   Pain in joint, lower leg 04/04/2022   Problem related to unspecified psychosocial circumstances 04/04/2022   General medical examination for administrative purposes 04/04/2022   Stage 3b chronic kidney disease ([place])    Dysphagia, post-stroke    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 hemorrhage of brain ([place]) 02/26/2022   Cough 15/40/0867   Acute metabolic encephalopathy 61/95/0932   Obstructive hydrocephalus ([place]) 02/19/2022   Dyslipidemia 02/19/2022   Dysphagia 02/19/2022   AKI (acute kidney injury) ([place])    Hyperkalemia    Anemia    Pleural effusion on right    Respiratory failure requiring intubation (HCC)    Cerebral edema ([place])  Chronic atrial fibrillation (HCC)    Controlled type 2 diabetes mellitus with hyperglycemia, without long-term current use of insulin (HCC)    ICH (intracerebral hemorrhage) ([place]) 02/10/2022   Intracranial hemorrhage (HCC)    Anticoagulated    Moderate tricuspid regurgitation 07/26/2021   Moderate aortic valve stenosis 03/08/2020   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[place]) 02/18/2020   Macrocytic anemia 01/13/2020   Spleen enlargement 01/13/2020   Bilateral carotid artery stenosis 07/14/2018   Atrial fibrillation, chronic  ([place]) 07/14/2018   Type 2 diabetes with nephropathy ([place]) 02/14/2016   Essential hypertension 08/16/2014   Hyperlipemia, mixed 08/16/2014   Renal insufficiency 08/16/2014    ONSET DATE: 02/10/2022  REFERRING DIAG: I62.9 (ICD-10-CM) - Nontraumatic intracranial hemorrhage, unspecified  THERAPY DIAG:  Muscle weakness (generalized)  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  He says the pre-op was the worst.  Pt accompanied by: self  PERTINENT HISTORY: cerebellar hemorrhage w/ shift and early hydrocephalus; emergent decompression surgery w/ Dr. Ellene Route on 02/11/22 w/ EVD pulled 02/14/22. PMH includes DMT2, HTN, HLD, atrial fibrillation on Eliquis, arthritis, anemia, and GERD  PAIN: Are you having pain? Yes.  Reports mild soreness in groin from procedure.  PATIENT GOALS: Be independent again; play guitar  OBJECTIVE:   TODAY'S TREATMENT:  Coordination: Box and Blocks: Left: 35 and 33.  OT lowered table height for 2nd attempt as pt hiking shoulder with first attempt. Pt still with decreased motor control and ataxia even when shoulder more "relaxed".   GMC/FMC: pipe tree puzzle in sitting with focus on motor control of LUE.  Pt dropping 3 due to ataxic movements..  OT providing cues for recall of strategies to minimize impact of ataxia on quality of movement.  GMC: engaged in reaching forward and diagonal ~45* to facilitate increased motor control during reaching.  Pt placing rings on target with intermittent ataxia.  Pt demonstrating and verbalizing understanding of techniques to decrease onset of ataxia, but still verbalizing frustration at onset.  PATIENT  EDUCATION: Ongoing condition-specific education, particularly regarding neuro reed and typical recovery patterns as well as benefit and goal of weight bearing/increased somatosensory input through affected UE Person educated: Patient Education method: Explanation Education comprehension: verbalized understanding   HOME EXERCISE PROGRAM: Coordination handout - see pt instructions  Access Code: [email or web address] URL: [email or web address] Date: 06/04/2022 Prepared by: [facility]    GOALS: Goals reviewed with patient? Yes  SHORT TERM GOALS: Target date: 06/08/22  STG  Status:  1 Pt will demonstrate independence w/ initial HEP designed for LUE GM and [place] Baseline: No HEP at this time Progressing  2 Pt will improve participation in functional bilateral FM tasks as evidenced by decreasing time to complete 9-Hole Peg Test w/ LUE by at least 6 sec Baseline: Right: 21.8  sec; Left: 51.2 sec Progressing -  L: 1:05.35 on 07/04/22  3 Pt will be able to complete simulated bilateral functional task (e.g. cutting food, manipulating clothing fasteners, washing dishes) within reasonable amount of time Baseline: Difficulty w/ bilateral tasks Met - pt reports "I have to be deliberate, but I can get it done"     LONG TERM GOALS: Target Date: 08/10/22  LTG  Status:  1 Pt will improve  participation in functional activities as evidenced by increasing FOTO score to 71 by d/c Baseline: 62 Progressing - score 61 on 07/04/22  2 Pt will improve Box and Blocks score to at least 40 blocks by d/c to indicate improved unilateral GMC of LUE Baseline: Right 61 blocks, Left 29 blocks Progressing - 07/04/22 - 36 blocks  3 Pt will safely demonstrate simulated grilling activity, incorporating compensatory strategies/AE prn, w/ Mod I by d/c Baseline: Unable to complete at this time Progressing - have not be walking outside  4 Pt will verbalize understanding/awareness of  modifications and strategies to minimize effects of ataxia on ADLs, IADLs, and leisure tasks.   Baseline:  Progressing  5 Pt will demonstrate improved motor control of LUE during functional reach to place light-mod weight items on shelf at shoulder height. Baseline: Progressing    ASSESSMENT:  CLINICAL IMPRESSION: Treatment session with focus on closed and open chain movements with focus on improved motor control.   Modified session to keep it low impact with no lifting or pulling due to recent Watchman procedure 6 days ago.  Reviewed recommendations for no heavy lifting or pulling for seven days.  Pt reports overall "relaxed" position with improvements in motor control when more relaxed in nature. Discussed finishing out POC with plans to then take a break from OT to allow for additional spontaneous recovery and focus on utilizing learned strategies while engaging in functional tasks and leisure pursuits (playing guitar).  Pt hesitant due to persistent ataxia, but in agreement with current plan.  PERFORMANCE DEFICITS in functional skills including ADLs, IADLs, coordination, dexterity, sensation, strength, FMC, GMC, mobility, balance, body mechanics, and UE functional use.  IMPAIRMENTS are limiting patient from ADLs, IADLs, and leisure.   COMORBIDITIES has co-morbidities such as atrial fibrillation on Eliquis and DMT2  that affects occupational performance. Patient will benefit from skilled OT to address above impairments and improve overall function.  MODIFICATION OR ASSISTANCE TO COMPLETE EVALUATION: Min-Moderate modification of tasks or assist with assess necessary to complete an evaluation.  OT OCCUPATIONAL PROFILE AND HISTORY: Detailed assessment: Review of records and additional review of physical, cognitive, psychosocial history related to current functional performance.  CLINICAL DECISION MAKING: Moderate - several treatment options, min-mod task modification necessary  REHAB  POTENTIAL: Good  EVALUATION COMPLEXITY: Moderate   PLAN: OT FREQUENCY: 2x/week  OT DURATION: 4 weeks  PLANNED INTERVENTIONS: self care/ADL training, therapeutic exercise, therapeutic activity, neuromuscular re-education, manual therapy, functional mobility training, aquatic therapy, electrical stimulation, ultrasound, biofeedback, moist heat, cryotherapy, patient/family education, energy conservation, and DME and/or AE instructions  RECOMMENDED OTHER SERVICES: To receive PT/ST services at this location  CONSULTED AND AGREED WITH PLAN OF CARE: Patient and family member/caregiver  PLAN FOR NEXT SESSION:   GMC/FMC activities for LUE. Focus on postural control to address ataxia.  Discuss possibility of compression garments to decrease ataxia. Seated vs standing weight-bearing, closed-chain exercise, coordination activities, etc.  Functional in nature, if possible, to carry over to pt desire to play guitar again. 2 more visits and then d/c.   Simonne Come, OTR/L 08/02/2022, 7:52 AM

## 2022-08-02 ENCOUNTER — Ambulatory Visit: Payer: Medicare Other | Admitting: Physical Therapy

## 2022-08-02 ENCOUNTER — Ambulatory Visit
Admission: RE | Admit: 2022-08-02 | Discharge: 2022-08-02 | Disposition: A | Discharge status: Home / Self Care | Payer: Medicare Other | Source: Ambulatory Visit | Attending: Nephrology | Admitting: Nephrology

## 2022-08-02 DIAGNOSIS — R809 Proteinuria, unspecified: Secondary | ICD-10-CM | POA: Insufficient documentation

## 2022-08-02 DIAGNOSIS — N281 Cyst of kidney, acquired: Secondary | ICD-10-CM | POA: Insufficient documentation

## 2022-08-02 DIAGNOSIS — D631 Anemia in chronic kidney disease: Secondary | ICD-10-CM | POA: Insufficient documentation

## 2022-08-02 DIAGNOSIS — N1831 Chronic kidney disease, stage 3a: Secondary | ICD-10-CM | POA: Diagnosis present

## 2022-08-02 DIAGNOSIS — N189 Chronic kidney disease, unspecified: Secondary | ICD-10-CM | POA: Diagnosis present

## 2022-08-02 DIAGNOSIS — E1122 Type 2 diabetes mellitus with diabetic chronic kidney disease: Secondary | ICD-10-CM | POA: Insufficient documentation

## 2022-08-03 NOTE — Therapy (Signed)
OUTPATIENT PHYSICAL THERAPY TREATMENT  Patient Name: Vernon [name] MRN: [id number] DOB:January 13, 1946, 76 y.o., male Today's Date: 08/03/2022  PCP: Baxter Hire, MD  REFERRING PROVIDER: Baxter Hire, MD        END OF SESSION:              Past Medical History:  Diagnosis Date   Anemia    Aortic stenosis    Arthritis    Complication of anesthesia    hard time getting bp up after knee replacement   Diabetes mellitus without complication ([place])    GERD (gastroesophageal reflux disease)    occ tums prn   History of hiatal hernia    Hypertension    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[place])    Presence of Watchman left atrial appendage closure device 07/26/2022   4mm Watchman FLX with Dr. Quentin Ore   Past Surgical History:  Procedure Laterality Date   CARPAL TUNNEL RELEASE  2012   CRANIOTOMY N/A 02/10/2022   Procedure: SUBOCCIPITAL CRANIECTOMY FOR EVACUATION OF CEREBELLAR HEMATOMA;  Surgeon: Kristeen Miss, MD;  Location: [place];  Service: Neurosurgery;  Laterality: N/A;   JOINT REPLACEMENT Right 2010   LEFT ATRIAL APPENDAGE OCCLUSION N/A 07/26/2022   Procedure: LEFT ATRIAL APPENDAGE OCCLUSION;  Surgeon: Vickie Epley, MD;  Location: [facility];  Service: Cardiovascular;  Laterality: N/A;   SHOULDER ARTHROSCOPY WITH ROTATOR CUFF REPAIR AND OPEN BICEPS TENODESIS Right 11/09/2019   Procedure: RIGHT SHOULDER ARTHROSCOPY WITH SUBSCAPULARIS REPAIR, SUBACROMIAL DECOMPRESSION,MINI OPEN ROTATOR CUFF REPAIR;  Surgeon: Leim Fabry, MD;  Location: [facility];  Service: Orthopedics;  Laterality: Right;   TEE WITHOUT CARDIOVERSION N/A 07/26/2022   Procedure: TRANSESOPHAGEAL ECHOCARDIOGRAM (TEE);  Surgeon: Vickie Epley, MD;  Location: [facility];  Service: Cardiovascular;  Laterality: N/A;   Patient Active Problem List   Diagnosis Date Noted   Atrial fibrillation ([place]) 07/26/2022   Presence of Watchman left atrial appendage closure device 07/26/2022    Proteinuria, unspecified 05/23/2022   Simple renal cyst 05/23/2022   Long term current use of anticoagulant 04/04/2022   Rotator cuff syndrome 04/04/2022   Exposure to potentially hazardous substance 04/04/2022   Gout 04/04/2022   Osteoarthritis 04/04/2022   Osteoporosis 04/04/2022   Other specified diseases of hair and hair follicles 25/63/8937   Pain in joint, lower leg 04/04/2022   Problem related to unspecified psychosocial circumstances 04/04/2022   General medical examination for administrative purposes 04/04/2022   Stage 3b chronic kidney disease ([place])    Dysphagia, post-stroke    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 hemorrhage of brain ([place]) 02/26/2022   Cough 34/28/7681   Acute metabolic encephalopathy 15/72/6203   Obstructive hydrocephalus ([place]) 02/19/2022   Dyslipidemia 02/19/2022   Dysphagia 02/19/2022   AKI (acute kidney injury) ([place])    Hyperkalemia    Anemia    Pleural effusion on right    Respiratory failure requiring intubation (HCC)    Cerebral edema (HCC)    Chronic atrial fibrillation ([place])    Controlled type 2 diabetes mellitus with hyperglycemia, without long-term current use of insulin (HCC)    ICH (intracerebral hemorrhage) ([place]) 02/10/2022   Intracranial hemorrhage (HCC)    Anticoagulated    Moderate tricuspid regurgitation 07/26/2021   Moderate aortic valve stenosis 03/08/2020   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[place]) 02/18/2020   Macrocytic anemia 01/13/2020   Spleen enlargement 01/13/2020   Bilateral carotid artery stenosis 07/14/2018   Atrial fibrillation, chronic ([place]) 07/14/2018   Type 2 diabetes with nephropathy ([place]) 02/14/2016   Essential hypertension  08/16/2014   Hyperlipemia, mixed 08/16/2014   Renal insufficiency 08/16/2014    REFERRING DIAG: I62.9 (ICD-10-CM) - Nontraumatic intracranial hemorrhage, unspecified   THERAPY DIAG:  No diagnosis found.  Rationale for Evaluation and Treatment Rehabilitation  PERTINENT HISTORY: see MAR  PRECAUTIONS: fall  risk  SUBJECTIVE: Had the Watchman procedure done last Thursday, so I've bee recovering.  Feel the procedure was a success.  Per MD and instructions, avoid heavy lifting "for a while" (to follow up with MD 9/17)  PAIN:  Are you having pain? Yes: NPRS scale: 4/10 Pain location: L knee-inferior aspect and medial to knee Pain description: aching, throbbing Aggravating factors: rotating in and out of foot Relieving factors: arthritis medication    OBJECTIVE:       TODAY'S TREATMENT: 08/06/22 Activity Comments                        HOME EXERCISE PROGRAM Last updated: 07/03/22 Access Code: [email or web address] URL: [email or web address] Date: 07/02/2022 Prepared by: [facility]  Exercises - Seated Hip Abduction  - 1 x daily - 5 x weekly - 2 sets - 10 reps - Seated Long Arc Quad  - 1 x daily - 5 x weekly - 2 sets - 10 reps - Sit to Stand with Counter Support  - 1 x daily - 5 x weekly - 2 sets - 10 reps - Standing Hip Abduction with Counter Support  - 1 x daily - 5 x weekly - 2 sets - 10 reps - Standing Alternating Knee Flexion with Ankle Weights  - 1 x daily - 5 x weekly - 2 sets - 10 reps   -------------------------------------------------------------------------------------------------------------------------   From eval  DIAGNOSTIC FINDINGS: see MAR   COGNITION: Overall cognitive status: Within functional limits for tasks assessed             SENSATION: WFL   COORDINATION: Bradykinesia, difficulty wit rapid alternating movements     MUSCLE TONE: WFL       POSTURE: No Significant postural limitations   LOWER EXTREMITY ROM:      WFL  (Blank rows = not tested)   LOWER EXTREMITY MMT:     MMT Right Eval Left Eval  Hip flexion 5 4  Hip extension 5 3+  Hip abduction 5 3-  Hip adduction      Hip internal rotation      Hip external rotation      Knee flexion 5 4  Knee extension 5 4  Ankle dorsiflexion 5 4+   Ankle plantarflexion      Ankle inversion      Ankle eversion      (Blank rows = not tested)   BED MOBILITY:  Independent   TRANSFERS: Assistive device utilized: Environmental consultant - 2 wheeled  Sit to stand: Modified independence Stand to sit: Modified independence Chair to chair: SBA and CGA Floor:  DNT   RAMP:  Not available   CURB:  Level of Assistance: SBA and CGA Assistive device utilized: Environmental consultant - 2 wheeled Curb Comments: undershoots with LLE   STAIRS:           Level of Assistance: SBA and CGA           Stair Negotiation Technique: Alternating Pattern  with Bilateral Rails           Number of Stairs: 6             Height of Stairs: 4-6"  Comments: dysmetria w/ LLE   GAIT: Gait pattern: circumduction- Left and ataxic Distance walked: 100 Assistive device utilized: Walker - 2 wheeled Level of assistance: SBA Comments: dysmetria LLE   FUNCTIONAL TESTs:  Timed up and go (TUG): 20.25 with RW Berg Balance Scale: 35/56      M-CTSIB  Condition 1: Firm Surface, EO 30 Sec, Mild Sway  Condition 2: Firm Surface, EC 30 Sec, Mild and Moderate Sway  Condition 3: Foam Surface, EO 9 Sec, Severe Sway  Condition 4: Foam Surface, EC 0 Sec,  unable  Sway     07/02/22 M-CTSIB  Condition 1: Firm Surface, EO 30 Sec, Mild and Moderate Sway  Condition 2: Firm Surface, EC 30 Sec, Moderate Sway * required RW support to get into [place]berg  Condition 3: Foam Surface, EO 30 Sec, Severe Sway * required RW support to get into [place]berg  Condition 4: Foam Surface, EC NT Sec, NT Sway          PATIENT SURVEYS:  FOTO 54.43% functional status   TODAY'S TREATMENT:  Evaluation, sidestepping along counter, tandem stance at counter     PATIENT EDUCATION: Education details: scope of PT intervention Person educated: Patient and Spouse Education method: Explanation Education comprehension: verbalized understanding     HOME EXERCISE PROGRAM: sidestepping along counter, tandem stance  at counter   -----------------------------------------------------------------------------------------------------------------------    GOALS: Goals reviewed with patient? Yes   SHORT TERM GOALS: Target date: 05/29/2022   Patient will perform HEP with family/caregiver supervision for improved strength, balance, transfers, and gait  Baseline: Goal status: MET   2.  Patient will achieve 15 seconds for TUG test to manifest reduced risk for falls Baseline: 20.25 with RW; 14 sec with RW Goal status: MET   3. Demo modified independent gait level surfaces and curb negotiation            Baseline: CGA-SBA with RW; unchanged 07/02/22            Goal status: On-going   LONG TERM GOALS: Target date: 08/13/2022   Patient will demonstrate score 45/56 Berg Balance Test to manifest low risk for falls Baseline: 35/56; 37/56 on 06/06/22; 37/56 07/02/22 Goal status: On-going   2.  Demonstrate improved static balance and postural control per time of 15 sec condition 4 M-CTSIB to improve safety with mobility on uneven surfaces Baseline: complete LOB left with condition 3; NT d/t safety 07/02/22; 8/21/203 15.66 sec, 10 sec Goal status: On-going   3.  Demo modified independent gait using least restrictive AD over various surfaces and ascend/descend stairs with set-up assist to improve functional mobility Baseline: able to navigate stairs with B handrail with CGA 07/02/22  Goal status: on-going   4.  Demo score of 57% functional status FOTO  Baseline: 54.43%; 57% 07/02/22  Goal status: MET       ASSESSMENT:   CLINICAL IMPRESSION: Pt underwent Watchman procedure for A-fib control last week; per notes and per pt report, precautions are to increase activity level slowly and to avoid heavy lifting.  Today's PT session focused mainly on bouts of gait training with RW as well as standing balance (static and lateral weigthshifting) on solid and compliant surfaces.  He continues to need cues for slowed pace  with gait and with transfers and to attend to posture with gait.  He will continue to benefit from skilled PT towards goals for improved functional mobility and decreased fall risk.  OBJECTIVE IMPAIRMENTS Abnormal gait, decreased activity tolerance, decreased balance, decreased coordination,  decreased endurance, decreased knowledge of use of DME, difficulty walking, decreased strength, impaired perceived functional ability, and improper body mechanics.    ACTIVITY LIMITATIONS carrying, lifting, bending, standing, squatting, stairs, transfers, and locomotion level   PARTICIPATION LIMITATIONS: meal prep, cleaning, laundry, driving, shopping, community activity, and yard work   PERSONAL FACTORS Age, Fitness, and Time since onset of injury/illness/exacerbation are also affecting patient's functional outcome.    REHAB POTENTIAL: Excellent   CLINICAL DECISION MAKING: Evolving/moderate complexity   EVALUATION COMPLEXITY: Moderate   PLAN: PT FREQUENCY: 2x/week   PT DURATION: 6 weeks   PLANNED INTERVENTIONS: Therapeutic exercises, Therapeutic activity, Neuromuscular re-education, Balance training, Gait training, Patient/Family education, Joint mobilization, Stair training, Vestibular training, Canalith repositioning, DME instructions, Aquatic Therapy, Electrical stimulation, Wheelchair mobility training, and Manual therapy   PLAN FOR NEXT SESSION:  Ask about knee pain; Work on weigthshifting, step strategy, reaching out of BOS; work on gait, turns, balance reactions and balance on uneven surfaces; Stair ambulation. Working towards [place] (*due 9/18-so need to discuss POC next week*)             Janene Harvey, PT, DPT 08/03/22 12:11 PM  [facility] [place], [place], [place] [address] Phone # [phone] Fax # [phone]

## 2022-08-06 ENCOUNTER — Encounter: Payer: Self-pay | Admitting: Physical Therapy

## 2022-08-06 ENCOUNTER — Ambulatory Visit: Payer: Medicare Other | Admitting: Occupational Therapy

## 2022-08-06 ENCOUNTER — Ambulatory Visit: Payer: Medicare Other | Admitting: Physical Therapy

## 2022-08-06 DIAGNOSIS — R278 Other lack of coordination: Secondary | ICD-10-CM

## 2022-08-06 DIAGNOSIS — R2689 Other abnormalities of gait and mobility: Secondary | ICD-10-CM

## 2022-08-06 DIAGNOSIS — I69254 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: Secondary | ICD-10-CM

## 2022-08-06 DIAGNOSIS — R2681 Unsteadiness on feet: Secondary | ICD-10-CM | POA: Diagnosis not present

## 2022-08-06 DIAGNOSIS — M6281 Muscle weakness (generalized): Secondary | ICD-10-CM

## 2022-08-06 DIAGNOSIS — R262 Difficulty in walking, not elsewhere classified: Secondary | ICD-10-CM

## 2022-08-06 NOTE — Therapy (Signed)
OUTPATIENT OCCUPATIONAL THERAPY Treatment Note   Patient Name: Vernon [name] MRN: [id number] DOB:[birth year], 76 y.o., male Today's Date: 08/06/2022  PCP: Baxter Hire, MD REFERRING PROVIDER: Baxter Hire, MD      OT End of Session - 08/06/22 1608     Visit Number 19    Number of Visits 25    Date for OT Re-Evaluation 08/10/22    Authorization Type [employer] Medicare    Authorization Time Period VL: [place]    Progress Note Due on Visit 25    OT Start Time 1451    OT Stop Time 1531    OT Time Calculation (min) 40 min    Activity Tolerance Patient tolerated treatment well    Behavior During Therapy WFL for tasks assessed/performed                       Past Medical History:  Diagnosis Date   Anemia    Aortic stenosis    Arthritis    Complication of anesthesia    hard time getting bp up after knee replacement   Diabetes mellitus without complication (HCC)    GERD (gastroesophageal reflux disease)    occ tums prn   History of hiatal hernia    Hypertension    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[place])    Presence of Watchman left atrial appendage closure device 07/26/2022   63mm Watchman FLX with Dr. Quentin Ore   Past Surgical History:  Procedure Laterality Date   CARPAL TUNNEL RELEASE  2012   CRANIOTOMY N/A 02/10/2022   Procedure: SUBOCCIPITAL CRANIECTOMY FOR EVACUATION OF CEREBELLAR HEMATOMA;  Surgeon: Kristeen Miss, MD;  Location: [place] [facility];  Service: Neurosurgery;  Laterality: N/A;   JOINT REPLACEMENT Right 2010   LEFT ATRIAL APPENDAGE OCCLUSION N/A 07/26/2022   Procedure: LEFT ATRIAL APPENDAGE OCCLUSION;  Surgeon: Vickie Epley, MD;  Location: [place] [facility];  Service: Cardiovascular;  Laterality: N/A;   SHOULDER ARTHROSCOPY WITH ROTATOR CUFF REPAIR AND OPEN BICEPS TENODESIS Right 11/09/2019   Procedure: RIGHT SHOULDER ARTHROSCOPY WITH SUBSCAPULARIS REPAIR, SUBACROMIAL DECOMPRESSION,MINI OPEN ROTATOR CUFF REPAIR;  Surgeon: Leim Fabry, MD;  Location: [facility];  Service: Orthopedics;  Laterality: Right;   TEE WITHOUT CARDIOVERSION N/A 07/26/2022   Procedure: TRANSESOPHAGEAL ECHOCARDIOGRAM (TEE);  Surgeon: Vickie Epley, MD;  Location: [place] [place] [facility];  Service: Cardiovascular;  Laterality: N/A;   Patient Active Problem List   Diagnosis Date Noted   Atrial fibrillation ([place]) 07/26/2022   Presence of Watchman left atrial appendage closure device 07/26/2022   Proteinuria, unspecified 05/23/2022   Simple renal cyst 05/23/2022   Long term current use of anticoagulant 04/04/2022   Rotator cuff syndrome 04/04/2022   Exposure to potentially hazardous substance 04/04/2022   Gout 04/04/2022   Osteoarthritis 04/04/2022   Osteoporosis 04/04/2022   Other specified diseases of hair and hair follicles 09/19/8526   Pain in joint, lower leg 04/04/2022   Problem related to unspecified psychosocial circumstances 04/04/2022   General medical examination for administrative purposes 04/04/2022   Stage 3b chronic kidney disease ([place])    Dysphagia, post-stroke    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 hemorrhage of brain ([place]) 02/26/2022   Cough 78/24/2353   Acute metabolic encephalopathy 61/44/3154   Obstructive hydrocephalus ([place]) 02/19/2022   Dyslipidemia 02/19/2022   Dysphagia 02/19/2022   AKI (acute kidney injury) ([place])    Hyperkalemia    Anemia    Pleural effusion on right    Respiratory failure requiring intubation (HCC)    Cerebral edema (  [place])    Chronic atrial fibrillation ([place])    Controlled type 2 diabetes mellitus with hyperglycemia, without long-term current use of insulin (HCC)    ICH (intracerebral hemorrhage) ([place]) 02/10/2022   Intracranial hemorrhage (HCC)    Anticoagulated    Moderate tricuspid regurgitation 07/26/2021   Moderate aortic valve stenosis 03/08/2020   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[place]) 02/18/2020   Macrocytic anemia 01/13/2020   Spleen enlargement 01/13/2020   Bilateral carotid artery stenosis 07/14/2018   Atrial fibrillation,  chronic ([place]) 07/14/2018   Type 2 diabetes with nephropathy ([place]) 02/14/2016   Essential hypertension 08/16/2014   Hyperlipemia, mixed 08/16/2014   Renal insufficiency 08/16/2014    ONSET DATE: 02/10/2022  REFERRING DIAG: I62.9 (ICD-10-CM) - Nontraumatic intracranial hemorrhage, unspecified  THERAPY DIAG:  Muscle weakness (generalized)  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    Pt accompanied by: self  PERTINENT HISTORY: cerebellar hemorrhage w/ shift and early hydrocephalus; emergent decompression surgery w/ Dr. Ellene Route on 02/11/22 w/ EVD pulled 02/14/22. PMH includes DMT2, HTN, HLD, atrial fibrillation on Eliquis, arthritis, anemia, and GERD  PAIN: Are you having pain? No  PATIENT GOALS: Be independent again; play guitar  OBJECTIVE:   TODAY'S TREATMENT:  Reviewed products available for post stroke.  Pt asking about MyOmo.  Therapist reiterated pt level of mobility and bulkiness of device may be overpowering and unnecessary. Discussed Vivistim process and procedure with pt reporting not wanting another procedure right now s/p watchman procedure.  Pt continues to demonstrate full AROM, but with decreased motor control and coordination due to ataxia.   Coordination:  Engaged in peg board activity with variety of sized pegs on elevated surface to facilitate increased functional reach and challenge of coordination when reaching at a diagonal and outside BOS.   Theraband: Educated on proximal strengthening to continue to address ataxia.  Therefore engaged in PNF pattern D2 reaching with red theraband, therapist providing demonstration and verbal cues for improved hand  placement and technique.  Engaged in horizontal abduction and external rotation with focus on symmetry and quality of movement with LUE.  OT providing intermittent verbal cues for positioning and cues to visually attend to LUE to increase quality of movement. Box and blocks: 36 with LUE after theraband activity, noting mild improvements in motor control.  PATIENT EDUCATION: Ongoing condition-specific education, particularly regarding neuro reed and typical recovery patterns as well as benefit and goal of weight bearing/increased somatosensory input through affected UE Person educated: Patient Education method: Explanation Education comprehension: verbalized understanding   HOME EXERCISE PROGRAM: Coordination handout - see pt instructions  Access Code: [email or web address] URL: [email or web address] Date: 06/04/2022 Prepared by: [facility]  Access Code: [email or web address] URL: [email or web address] Date: 08/06/2022 Prepared by: [facility]  Exercises - Seated Single Shoulder PNF D2 with Resistance  - 1 x daily - 3-4 x weekly - 2 sets - 10 reps - Seated Shoulder Horizontal Abduction with Resistance  - 1 x daily - 3-4 x weekly - 2 sets - 10 reps - Shoulder W - External Rotation with Resistance  - 1 x daily - 3-4 x weekly - 2 sets - 10 reps    GOALS: Goals reviewed with  patient? Yes  SHORT TERM GOALS: Target date: 06/08/22  STG  Status:  1 Pt will demonstrate independence w/ initial HEP designed for LUE GM and [place] Baseline: No HEP at this time Progressing  2 Pt will improve participation in functional bilateral FM tasks as evidenced by decreasing time to complete 9-Hole Peg Test w/ LUE by at least 6 sec Baseline: Right: 21.8  sec; Left: 51.2 sec Progressing -  L: 1:05.35 on 07/04/22  3 Pt will be able to complete simulated bilateral functional task (e.g. cutting food, manipulating clothing fasteners,  washing dishes) within reasonable amount of time Baseline: Difficulty w/ bilateral tasks Met - pt reports "I have to be deliberate, but I can get it done"     LONG TERM GOALS: Target Date: 08/10/22  LTG  Status:  1 Pt will improve participation in functional activities as evidenced by increasing FOTO score to 71 by d/c Baseline: 62 Progressing - score 61 on 07/04/22  2 Pt will improve Box and Blocks score to at least 40 blocks by d/c to indicate improved unilateral GMC of LUE Baseline: Right 61 blocks, Left 29 blocks Progressing - 07/04/22 - 36 blocks  3 Pt will safely demonstrate simulated grilling activity, incorporating compensatory strategies/AE prn, w/ Mod I by d/c Baseline: Unable to complete at this time Progressing - have not be walking outside  4 Pt will verbalize understanding/awareness of modifications and strategies to minimize effects of ataxia on ADLs, IADLs, and leisure tasks.   Baseline:  Progressing  5 Pt will demonstrate improved motor control of LUE during functional reach to place light-mod weight items on shelf at shoulder height. Baseline: Progressing    ASSESSMENT:  CLINICAL IMPRESSION: Treatment session with focus on proximal stability to facilitate improved motor control.   Pt asking questions about additional products and procedures available to assist with mobility s/p CVA, pt is not appropriate for ones that he inquired about.  Pt continues to demonstrate ataxia, especially in open chain movements and when attempting quick movements.    PERFORMANCE DEFICITS in functional skills including ADLs, IADLs, coordination, dexterity, sensation, strength, FMC, GMC, mobility, balance, body mechanics, and UE functional use.  IMPAIRMENTS are limiting patient from ADLs, IADLs, and leisure.   COMORBIDITIES has co-morbidities such as atrial fibrillation on Eliquis and DMT2  that affects occupational performance. Patient will benefit from skilled OT to address above impairments and  improve overall function.  MODIFICATION OR ASSISTANCE TO COMPLETE EVALUATION: Min-Moderate modification of tasks or assist with assess necessary to complete an evaluation.  OT OCCUPATIONAL PROFILE AND HISTORY: Detailed assessment: Review of records and additional review of physical, cognitive, psychosocial history related to current functional performance.  CLINICAL DECISION MAKING: Moderate - several treatment options, min-mod task modification necessary  REHAB POTENTIAL: Good  EVALUATION COMPLEXITY: Moderate   PLAN: OT FREQUENCY: 2x/week  OT DURATION: 4 weeks  PLANNED INTERVENTIONS: self care/ADL training, therapeutic exercise, therapeutic activity, neuromuscular re-education, manual therapy, functional mobility training, aquatic therapy, electrical stimulation, ultrasound, biofeedback, moist heat, cryotherapy, patient/family education, energy conservation, and DME and/or AE instructions  RECOMMENDED OTHER SERVICES: To receive PT/ST services at this location  CONSULTED AND AGREED WITH PLAN OF CARE: Patient and family member/caregiver  PLAN FOR NEXT SESSION:   Focus on postural control to address ataxia.  Discuss possibility of compression garments to decrease ataxia. Seated vs standing weight-bearing, closed-chain exercise, coordination activities, etc.  Complete FOTO and d/c.  Simonne Come, OTR/L 08/06/2022, 4:09 PM

## 2022-08-08 ENCOUNTER — Ambulatory Visit: Payer: Medicare Other

## 2022-08-08 ENCOUNTER — Inpatient Hospital Stay: Payer: Medicare Other | Attending: Internal Medicine

## 2022-08-08 ENCOUNTER — Ambulatory Visit: Payer: Medicare Other | Admitting: Occupational Therapy

## 2022-08-08 ENCOUNTER — Inpatient Hospital Stay: Payer: Medicare Other

## 2022-08-08 VITALS — BP 141/81 | HR 62

## 2022-08-08 DIAGNOSIS — I69254 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: Secondary | ICD-10-CM

## 2022-08-08 DIAGNOSIS — N183 Chronic kidney disease, stage 3 unspecified: Secondary | ICD-10-CM | POA: Diagnosis not present

## 2022-08-08 DIAGNOSIS — D461 Refractory anemia with ring sideroblasts: Secondary | ICD-10-CM | POA: Diagnosis present

## 2022-08-08 DIAGNOSIS — D469 Myelodysplastic syndrome, unspecified: Secondary | ICD-10-CM

## 2022-08-08 DIAGNOSIS — R2681 Unsteadiness on feet: Secondary | ICD-10-CM

## 2022-08-08 DIAGNOSIS — R2689 Other abnormalities of gait and mobility: Secondary | ICD-10-CM

## 2022-08-08 DIAGNOSIS — M6281 Muscle weakness (generalized): Secondary | ICD-10-CM

## 2022-08-08 DIAGNOSIS — R278 Other lack of coordination: Secondary | ICD-10-CM

## 2022-08-08 DIAGNOSIS — R262 Difficulty in walking, not elsewhere classified: Secondary | ICD-10-CM

## 2022-08-08 LAB — HEMOGLOBIN AND HEMATOCRIT, BLOOD
HCT: 27.5 % — ABNORMAL LOW (ref 39.0–52.0)
Hemoglobin: 8.8 g/dL — ABNORMAL LOW (ref 13.0–17.0)

## 2022-08-08 MED ORDER — DARBEPOETIN ALFA 100 MCG/0.5ML IJ SOSY
100.0000 ug | PREFILLED_SYRINGE | Freq: Once | INTRAMUSCULAR | Status: AC
  Administered 2022-08-08: 100 ug via SUBCUTANEOUS
  Filled 2022-08-08: qty 0.5

## 2022-08-08 NOTE — Therapy (Signed)
OUTPATIENT PHYSICAL THERAPY TREATMENT  Patient Name: Vernon [name] MRN: [id number] DOB:January 06, 1946, 76 y.o., male Today's Date: 08/08/2022  PCP: Baxter Hire, MD  REFERRING PROVIDER: Baxter Hire, MD        END OF SESSION:   PT End of Session - 08/08/22 1620     Visit Number 24    Number of Visits 27    Date for PT Re-Evaluation 08/13/22    Authorization Type [employer] Medicare    Progress Note Due on Visit 71    PT Start Time 1615    PT Stop Time 1700    PT Time Calculation (min) 45 min    Equipment Utilized During Treatment Gait belt    Activity Tolerance Patient tolerated treatment well   acute L knee pain   Behavior During Therapy WFL for tasks assessed/performed                       Past Medical History:  Diagnosis Date   Anemia    Aortic stenosis    Arthritis    Complication of anesthesia    hard time getting bp up after knee replacement   Diabetes mellitus without complication (HCC)    GERD (gastroesophageal reflux disease)    occ tums prn   History of hiatal hernia    Hypertension    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[place])    Presence of Watchman left atrial appendage closure device 07/26/2022   93mm Watchman FLX with Dr. Quentin Ore   Past Surgical History:  Procedure Laterality Date   CARPAL TUNNEL RELEASE  2012   CRANIOTOMY N/A 02/10/2022   Procedure: SUBOCCIPITAL CRANIECTOMY FOR EVACUATION OF CEREBELLAR HEMATOMA;  Surgeon: Kristeen Miss, MD;  Location: [facility];  Service: Neurosurgery;  Laterality: N/A;   JOINT REPLACEMENT Right 2010   LEFT ATRIAL APPENDAGE OCCLUSION N/A 07/26/2022   Procedure: LEFT ATRIAL APPENDAGE OCCLUSION;  Surgeon: Vickie Epley, MD;  Location: [facility];  Service: Cardiovascular;  Laterality: N/A;   SHOULDER ARTHROSCOPY WITH ROTATOR CUFF REPAIR AND OPEN BICEPS TENODESIS Right 11/09/2019   Procedure: RIGHT SHOULDER ARTHROSCOPY WITH SUBSCAPULARIS REPAIR, SUBACROMIAL DECOMPRESSION,MINI OPEN ROTATOR CUFF REPAIR;   Surgeon: Leim Fabry, MD;  Location: [facility];  Service: Orthopedics;  Laterality: Right;   TEE WITHOUT CARDIOVERSION N/A 07/26/2022   Procedure: TRANSESOPHAGEAL ECHOCARDIOGRAM (TEE);  Surgeon: Vickie Epley, MD;  Location: [facility];  Service: Cardiovascular;  Laterality: N/A;   Patient Active Problem List   Diagnosis Date Noted   Atrial fibrillation ([place]) 07/26/2022   Presence of Watchman left atrial appendage closure device 07/26/2022   Proteinuria, unspecified 05/23/2022   Simple renal cyst 05/23/2022   Long term current use of anticoagulant 04/04/2022   Rotator cuff syndrome 04/04/2022   Exposure to potentially hazardous substance 04/04/2022   Gout 04/04/2022   Osteoarthritis 04/04/2022   Osteoporosis 04/04/2022   Other specified diseases of hair and hair follicles 81/27/5170   Pain in joint, lower leg 04/04/2022   Problem related to unspecified psychosocial circumstances 04/04/2022   General medical examination for administrative purposes 04/04/2022   Stage 3b chronic kidney disease ([place])    Dysphagia, post-stroke    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 hemorrhage of brain ([place]) 02/26/2022   Cough 01/74/9449   Acute metabolic encephalopathy 67/59/1638   Obstructive hydrocephalus ([place]) 02/19/2022   Dyslipidemia 02/19/2022   Dysphagia 02/19/2022   AKI (acute kidney injury) ([place])    Hyperkalemia    Anemia    Pleural effusion on right  Respiratory failure requiring intubation (HCC)    Cerebral edema (HCC)    Chronic atrial fibrillation (HCC)    Controlled type 2 diabetes mellitus with hyperglycemia, without long-term current use of insulin (HCC)    ICH (intracerebral hemorrhage) ([place]) 02/10/2022   Intracranial hemorrhage (HCC)    Anticoagulated    Moderate tricuspid regurgitation 07/26/2021   Moderate aortic valve stenosis 03/08/2020   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) 02/18/2020   Macrocytic anemia 01/13/2020   Spleen enlargement 01/13/2020   Bilateral carotid artery  stenosis 07/14/2018   Atrial fibrillation, chronic ([place]) 07/14/2018   Type 2 diabetes with nephropathy ([place]) 02/14/2016   Essential hypertension 08/16/2014   Hyperlipemia, mixed 08/16/2014   Renal insufficiency 08/16/2014    REFERRING DIAG: I62.9 (ICD-10-CM) - Nontraumatic intracranial hemorrhage, unspecified   THERAPY DIAG:  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 Just feeling lightheaded with sudden position changes  PAIN:  Are you having pain? Yes: NPRS scale: 3/10 Pain location: L knee-inferior aspect and medial to knee Pain description: soreness Aggravating factors: rotating in and out of foot Relieving factors: arthritis medication    OBJECTIVE:   TODAY'S TREATMENT: 08/08/22 Activity Comments  Bosu trainer -alt taps x 2 min -lateral step and weight shift   Semi-tandem: lead foot on lateral rocker board 3x30 sec   Forward/backward over half bolster  10 reps BUE support, 10 reps single UE, 10 reps half step no UE support  Lateral step over half bolster No UE support, cues segmentation to allow for motor planning  Foot on bosu EO/EC 2x10 sec, head turns 5x  Standing on foam Reaching across midline to retrieve cones        TODAY'S TREATMENT: 08/06/22 Activity Comments  figure 8 turns with RW with wide and narrow turns Cues to widen BOS to avoid scissoring  step ups L/R with 1 UE support 10x each Ataxia upon stepping up; cues to control eccentric lower  ant/pos wt shifts feet staggered 10-15x each CGA-min A with tendency for LOB to L; cues to slow down and segment  movement  ant/pos wt shifts feet apart 20x Initially overshooting wt shifts with improved ability to fine tune/adjust with practice   standing on foam wide 30", narrow 3x30" Cueing to shift hips R to offset L LOB with good ability; difficulty recognizing LOB     PATIENT EDUCATION: Education details: discussed possibility of [employer] or senior center- patient reports that there is no fitness center close to his home, thus encouraged bike at low resistance for safe exercise; encouraged rug tape d/t patient's concerns about getting caught on throw rugs in the home; HEP update Person educated: Patient Education method: Explanation Education comprehension: verbalized understanding     HOME EXERCISE PROGRAM Last updated: 08/06/22 Access Code: [email or web address] URL: [email or web address] Date: 08/06/2022 Prepared by: [facility]  Program Notes Perform standing exercises with supervision only!  Exercises - Seated Hip Abduction  - 1 x daily - 5 x weekly - 2 sets - 10 reps - Seated Long Arc Quad  - 1 x daily - 5 x weekly - 2 sets - 10 reps - Sit to Stand with Counter Support  - 1  x daily - 5 x weekly - 2 sets - 10 reps - Standing Hip Abduction with Counter Support  - 1 x daily - 5 x weekly - 2 sets - 10 reps - Standing Alternating Knee Flexion with Ankle Weights  - 1 x daily - 5 x weekly - 2 sets - 10 reps - Forward Backward Weight Shift with Counter Support  - 1 x daily - 5 x weekly - 2 sets - 10 reps   -------------------------------------------------------------------------------------------------------------------------   From eval  DIAGNOSTIC FINDINGS: see MAR   COGNITION: Overall cognitive status: Within functional limits for tasks assessed             SENSATION: WFL   COORDINATION: Bradykinesia, difficulty wit rapid alternating movements     MUSCLE TONE: WFL       POSTURE: No Significant postural limitations   LOWER EXTREMITY  ROM:      WFL  (Blank rows = not tested)   LOWER EXTREMITY MMT:     MMT Right Eval Left Eval  Hip flexion 5 4  Hip extension 5 3+  Hip abduction 5 3-  Hip adduction      Hip internal rotation      Hip external rotation      Knee flexion 5 4  Knee extension 5 4  Ankle dorsiflexion 5 4+  Ankle plantarflexion      Ankle inversion      Ankle eversion      (Blank rows = not tested)   BED MOBILITY:  Independent   TRANSFERS: Assistive device utilized: Environmental consultant - 2 wheeled  Sit to stand: Modified independence Stand to sit: Modified independence Chair to chair: SBA and CGA Floor:  DNT   RAMP:  Not available   CURB:  Level of Assistance: SBA and CGA Assistive device utilized: Environmental consultant - 2 wheeled Curb Comments: undershoots with LLE   STAIRS:           Level of Assistance: SBA and CGA           Stair Negotiation Technique: Alternating Pattern  with Bilateral Rails           Number of Stairs: 6             Height of Stairs: 4-6"            Comments: dysmetria w/ LLE   GAIT: Gait pattern: circumduction- Left and ataxic Distance walked: 100 Assistive device utilized: Walker - 2 wheeled Level of assistance: SBA Comments: dysmetria LLE   FUNCTIONAL TESTs:  Timed up and go (TUG): 20.25 with RW Berg Balance Scale: 35/56      M-CTSIB  Condition 1: Firm Surface, EO 30 Sec, Mild Sway  Condition 2: Firm Surface, EC 30 Sec, Mild and Moderate Sway  Condition 3: Foam Surface, EO 9 Sec, Severe Sway  Condition 4: Foam Surface, EC 0 Sec,  unable  Sway     07/02/22 M-CTSIB  Condition 1: Firm Surface, EO 30 Sec, Mild and Moderate Sway  Condition 2: Firm Surface, EC 30 Sec, Moderate Sway * required RW support to get into romberg  Condition 3: Foam Surface, EO 30 Sec, Severe Sway * required RW support to get into romberg  Condition 4: Foam Surface, EC NT Sec, NT Sway          PATIENT SURVEYS:  FOTO 54.43% functional status   TODAY'S TREATMENT:  Evaluation,  sidestepping along counter, tandem stance at counter     PATIENT EDUCATION: Education details: scope  of PT intervention Person educated: Patient and Spouse Education method: Explanation Education comprehension: verbalized understanding     HOME EXERCISE PROGRAM: sidestepping along counter, tandem stance at counter   -----------------------------------------------------------------------------------------------------------------------    GOALS: Goals reviewed with patient? Yes   SHORT TERM GOALS: Target date: 05/29/2022   Patient will perform HEP with family/caregiver supervision for improved strength, balance, transfers, and gait  Baseline: Goal status: MET   2.  Patient will achieve 15 seconds for TUG test to manifest reduced risk for falls Baseline: 20.25 with RW; 14 sec with RW Goal status: MET   3. Demo modified independent gait level surfaces and curb negotiation            Baseline: CGA-SBA with RW; unchanged 07/02/22            Goal status: On-going   LONG TERM GOALS: Target date: 08/13/2022   Patient will demonstrate score 45/56 Berg Balance Test to manifest low risk for falls Baseline: 35/56; 37/56 on 06/06/22; 37/56 07/02/22 Goal status: On-going   2.  Demonstrate improved static balance and postural control per time of 15 sec condition 4 M-CTSIB to improve safety with mobility on uneven surfaces Baseline: complete LOB left with condition 3; NT d/t safety 07/02/22; 8/21/203 15.66 sec, 10 sec Goal status: On-going   3.  Demo modified independent gait using least restrictive AD over various surfaces and ascend/descend stairs with set-up assist to improve functional mobility Baseline: able to navigate stairs with B handrail with CGA 07/02/22  Goal status: on-going   4.  Demo score of 57% functional status FOTO  Baseline: 54.43%; 57% 07/02/22  Goal status: MET       ASSESSMENT:   CLINICAL IMPRESSION: Demonstrating improved stability and balance with  activities, initially requiring cues for slower pacing and task segmentation with decrease in ataxia when intentional motor planning was implemented with pt able to carryover this strategy unprompted by end of session. Improving ability to cross midline and exhibit more fluid control with LUE during reach/grasp task as now able to perform standing on compliant surface without UE support.  Continued sessions indicated to progress dynamic balance, motor coordination/control, and facilitate return to modified independent gait   OBJECTIVE IMPAIRMENTS Abnormal gait, decreased activity tolerance, decreased balance, decreased coordination, decreased endurance, decreased knowledge of use of DME, difficulty walking, decreased strength, impaired perceived functional ability, and improper body mechanics.    ACTIVITY LIMITATIONS carrying, lifting, bending, standing, squatting, stairs, transfers, and locomotion level   PARTICIPATION LIMITATIONS: meal prep, cleaning, laundry, driving, shopping, community activity, and yard work   PERSONAL FACTORS Age, Fitness, and Time since onset of injury/illness/exacerbation are also affecting patient's functional outcome.    REHAB POTENTIAL: Excellent   CLINICAL DECISION MAKING: Evolving/moderate complexity   EVALUATION COMPLEXITY: Moderate   PLAN: PT FREQUENCY: 2x/week   PT DURATION: 6 weeks   PLANNED INTERVENTIONS: Therapeutic exercises, Therapeutic activity, Neuromuscular re-education, Balance training, Gait training, Patient/Family education, Joint mobilization, Stair training, Vestibular training, Canalith repositioning, DME instructions, Aquatic Therapy, Electrical stimulation, Wheelchair mobility training, and Manual therapy   PLAN FOR NEXT SESSION:  Recert with tx frequency to 1x/wk?    5:15 PM, 08/08/22 M. Sherlyn Lees, PT, DPT Physical Therapist- [place] Office Number: [phone]   [place] at [facility] [address], [place], [place] [address] Phone # [phone] Fax # [phone]

## 2022-08-08 NOTE — Therapy (Signed)
OUTPATIENT OCCUPATIONAL THERAPY Treatment Note & Discharge  Patient Name: Vernon [name] MRN: [id number] DOB:1946-01-25, 76 y.o., male Today's Date: 08/08/2022  PCP: Baxter Hire, MD REFERRING PROVIDER: Baxter Hire, MD  OCCUPATIONAL THERAPY DISCHARGE SUMMARY  Visits from Start of Care: 20  Current functional level related to goals / functional outcomes: Pt is appropriate for d/c from skilled OT at this time as he has met 2 of 5 LTGS and is currently demonstrating minimal progress towards remaining goals since last goal update.  Pt is able to complete HEP at this time to continue to address proximal stability and strengthening and understands recommendation of increased incorporation of LUE into functional tasks to continue to address motor control in home/functional activities.  Pt reports understanding of recommendations and is currently agreeable to discharge plan.    Remaining deficits: LUE ataxia   Education / Equipment: Education on modifications and proximal stability to decrease ataxia, HEP for strengthening and coordination   Patient agrees to discharge. Patient goals were partially met. Patient is being discharged due to lack of progress..       OT End of Session - 08/08/22 1502     Visit Number 20    Number of Visits 25    Date for OT Re-Evaluation 08/10/22    Authorization Type [employer]    Authorization Time Period VL: [place]    Progress Note Due on Visit 25    OT Start Time 1454    OT Stop Time 1530    OT Time Calculation (min) 36 min    Activity Tolerance Patient tolerated treatment well    Behavior During Therapy WFL for tasks assessed/performed                        Past Medical History:  Diagnosis Date   Anemia    Aortic stenosis    Arthritis    Complication of anesthesia    hard time getting bp up after knee replacement   Diabetes mellitus without complication (HCC)    GERD (gastroesophageal reflux disease)    occ tums prn    History of hiatal hernia    Hypertension    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[place])    Presence of Watchman left atrial appendage closure device 07/26/2022   61mm Watchman FLX with Dr. Quentin Ore   Past Surgical History:  Procedure Laterality Date   CARPAL TUNNEL RELEASE  2012   CRANIOTOMY N/A 02/10/2022   Procedure: SUBOCCIPITAL CRANIECTOMY FOR EVACUATION OF CEREBELLAR HEMATOMA;  Surgeon: Kristeen Miss, MD;  Location: [facility];  Service: Neurosurgery;  Laterality: N/A;   JOINT REPLACEMENT Right 2010   LEFT ATRIAL APPENDAGE OCCLUSION N/A 07/26/2022   Procedure: LEFT ATRIAL APPENDAGE OCCLUSION;  Surgeon: Vickie Epley, MD;  Location: [facility];  Service: Cardiovascular;  Laterality: N/A;   SHOULDER ARTHROSCOPY WITH ROTATOR CUFF REPAIR AND OPEN BICEPS TENODESIS Right 11/09/2019   Procedure: RIGHT SHOULDER ARTHROSCOPY WITH SUBSCAPULARIS REPAIR, SUBACROMIAL DECOMPRESSION,MINI OPEN ROTATOR CUFF REPAIR;  Surgeon: Leim Fabry, MD;  Location: [facility];  Service: Orthopedics;  Laterality: Right;   TEE WITHOUT CARDIOVERSION N/A 07/26/2022   Procedure: TRANSESOPHAGEAL ECHOCARDIOGRAM (TEE);  Surgeon: Vickie Epley, MD;  Location: [facility];  Service: Cardiovascular;  Laterality: N/A;   Patient Active Problem List   Diagnosis Date Noted   Atrial fibrillation ([place]) 07/26/2022   Presence of Watchman left atrial appendage closure device 07/26/2022   Proteinuria, unspecified 05/23/2022   Simple renal cyst  05/23/2022   Long term current use of anticoagulant 04/04/2022   Rotator cuff syndrome 04/04/2022   Exposure to potentially hazardous substance 04/04/2022   Gout 04/04/2022   Osteoarthritis 04/04/2022   Osteoporosis 04/04/2022   Other specified diseases of hair and hair follicles 23/76/2831   Pain in joint, lower leg 04/04/2022   Problem related to unspecified psychosocial circumstances 04/04/2022   General medical examination for administrative purposes 04/04/2022   Stage 3b  chronic kidney disease ([place])    Dysphagia, post-stroke    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 hemorrhage of brain ([place]) 02/26/2022   Cough 51/76/1607   Acute metabolic encephalopathy 37/08/6268   Obstructive hydrocephalus ([place]) 02/19/2022   Dyslipidemia 02/19/2022   Dysphagia 02/19/2022   AKI (acute kidney injury) ([place])    Hyperkalemia    Anemia    Pleural effusion on right    Respiratory failure requiring intubation (HCC)    Cerebral edema (HCC)    Chronic atrial fibrillation ([place])    Controlled type 2 diabetes mellitus with hyperglycemia, without long-term current use of insulin (HCC)    ICH (intracerebral hemorrhage) ([place]) 02/10/2022   Intracranial hemorrhage (HCC)    Anticoagulated    Moderate tricuspid regurgitation 07/26/2021   Moderate aortic valve stenosis 03/08/2020   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[place]) 02/18/2020   Macrocytic anemia 01/13/2020   Spleen enlargement 01/13/2020   Bilateral carotid artery stenosis 07/14/2018   Atrial fibrillation, chronic ([place]) 07/14/2018   Type 2 diabetes with nephropathy ([place]) 02/14/2016   Essential hypertension 08/16/2014   Hyperlipemia, mixed 08/16/2014   Renal insufficiency 08/16/2014    ONSET DATE: 02/10/2022  REFERRING DIAG: I62.9 (ICD-10-CM) - Nontraumatic intracranial hemorrhage, unspecified  THERAPY DIAG:  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  Pt accompanied by: self  PERTINENT HISTORY: cerebellar hemorrhage w/ shift and early hydrocephalus; emergent decompression surgery w/ Dr. Ellene Route on 02/11/22 w/ EVD pulled 02/14/22. PMH includes DMT2, HTN, HLD, atrial fibrillation on Eliquis, arthritis, anemia, and  GERD  PAIN: Are you having pain? No  PATIENT GOALS: Be independent again; play guitar  OBJECTIVE:   TODAY'S TREATMENT:  Theraband: Reviewed theraband exercises with red band.  Pt demonstrating good recall and carryover of external rotation.  Requiring min cues and demonstration for proper technique for PNF pattern diagonals.  Pt unable to recall horizontal abduction, but able to complete with demonstration and cues for improved symmetry. Coordination: Pouring items from one cup to another with focus on strategies to minimize impact of ataxia on movement.  Pt demonstrating decreased ataxia when positioning LUE on edge of table to provide min feedback. 9 hole peg test: Completed in 1:08.31 with LUE.  Pt continues to demonstrate decreased coordination especially during tasks requiring increase speed and reactionary movements.  Pt states "as long as I go slow and deliberate" that he does not have as many issues. Reaching: engaged in reaching to shoulder height shelf with LUE to retreive light and moderate weight items.  Pt demonstrating mild ataxia when reaching for weighted and unweighted items, especially when reaching away from body.  Pt demonstrating decreased ataxia when placing cups on counter vs shoulder height shelf. Completed FOTO: score of 63 compared to eval 62 - with pt reporting improvements from some difficulty to minimal difficulty with reaching to  place items in cabinet and picking up coins.  Progress continues to be limited d/t ataxia.   PATIENT EDUCATION: Ongoing condition-specific education, particularly regarding neuro reed and typical recovery patterns as well as benefit and goal of weight bearing/increased somatosensory input through affected UE Person educated: Patient Education method: Explanation Education comprehension: verbalized understanding   HOME EXERCISE PROGRAM: Coordination handout - see pt instructions  Access Code: [email or web address] URL:  [email or web address] Date: 06/04/2022 Prepared by: [facility]  Access Code: [email or web address] URL: [email or web address] Date: 08/06/2022 Prepared by: [facility]  Exercises - Seated Single Shoulder PNF D2 with Resistance  - 1 x daily - 3-4 x weekly - 2 sets - 10 reps - Seated Shoulder Horizontal Abduction with Resistance  - 1 x daily - 3-4 x weekly - 2 sets - 10 reps - Shoulder W - External Rotation with Resistance  - 1 x daily - 3-4 x weekly - 2 sets - 10 reps    GOALS: Goals reviewed with patient? Yes  SHORT TERM GOALS: Target date: 06/08/22  STG  Status:  1 Pt will demonstrate independence w/ initial HEP designed for LUE GM and [place] Baseline: No HEP at this time Progressing  2 Pt will improve participation in functional bilateral FM tasks as evidenced by decreasing time to complete 9-Hole Peg Test w/ LUE by at least 6 sec Baseline: Right: 21.8  sec; Left: 51.2 sec Progressing -  L: 1:05.35 on 07/04/22  3 Pt will be able to complete simulated bilateral functional task (e.g. cutting food, manipulating clothing fasteners, washing dishes) within reasonable amount of time Baseline: Difficulty w/ bilateral tasks Met - pt reports "I have to be deliberate, but I can get it done"     LONG TERM GOALS: Target Date: 08/10/22  LTG  Status:  1 Pt will improve participation in functional activities as evidenced by increasing FOTO score to 71 by d/c Baseline: 62  Progressing - score 63 on 08/08/22  2 Pt will improve Box and Blocks score to at least 40 blocks by d/c to indicate improved unilateral GMC of LUE Baseline: Right 61 blocks, Left 29 blocks  Progressing - 07/04/22 - 36 blocks  3 Pt will safely demonstrate simulated grilling activity, incorporating compensatory strategies/AE prn, w/ Mod I by d/c Baseline: Unable to complete at this time  Not met - have not been walking outside, but is assisting in  meal prep in the house  4 Pt will verbalize understanding/awareness of modifications and strategies to minimize effects of ataxia on ADLs, IADLs, and leisure tasks.   Baseline:   Met  5 Pt will demonstrate improved motor control of LUE during functional reach to place light-mod weight items on shelf at shoulder height. Baseline:  Met    ASSESSMENT:  CLINICAL IMPRESSION: Treatment session with focus on proximal stability and strengthening to facilitate improved motor control.  Pt continues to demonstrate ataxia, especially in open chain movements and when attempting quick movements - however demonstrating mild improvements with pouring items, reaching to shoulder height, and stacking cups with increased focus, WB as able, and "deliberate" movements.  Pt is appropriate for d/c from skilled OT at this time due to minimal progress made since last goal update.  Pt is able to complete HEP at this time to continue to address proximal stability and strengthening and understands recommendation of increased incorporation of LUE into functional tasks to continue to address motor control in home/functional  activities.  Pt reports understanding of recommendations and is currently agreeable to discharge plan.   PERFORMANCE DEFICITS in functional skills including ADLs, IADLs, coordination, dexterity, sensation, strength, FMC, GMC, mobility, balance, body mechanics, and UE functional use.  IMPAIRMENTS are limiting patient from ADLs, IADLs, and leisure.   COMORBIDITIES has co-morbidities such as atrial fibrillation on Eliquis and DMT2  that affects occupational performance. Patient will benefit from skilled OT to address above impairments and improve overall function.  MODIFICATION OR ASSISTANCE TO COMPLETE EVALUATION: Min-Moderate modification of tasks or assist with assess necessary to complete an evaluation.  OT OCCUPATIONAL PROFILE AND HISTORY: Detailed assessment: Review of records and additional review of  physical, cognitive, psychosocial history related to current functional performance.  CLINICAL DECISION MAKING: Moderate - several treatment options, min-mod task modification necessary  REHAB POTENTIAL: Good  EVALUATION COMPLEXITY: Moderate   PLAN: OT FREQUENCY: 2x/week  OT DURATION: 4 weeks  PLANNED INTERVENTIONS: self care/ADL training, therapeutic exercise, therapeutic activity, neuromuscular re-education, manual therapy, functional mobility training, aquatic therapy, electrical stimulation, ultrasound, biofeedback, moist heat, cryotherapy, patient/family education, energy conservation, and DME and/or AE instructions  RECOMMENDED OTHER SERVICES: To receive PT/ST services at this location  CONSULTED AND AGREED WITH PLAN OF CARE: Patient and family member/caregiver  PLAN FOR NEXT SESSION:  D/C from therapy  Ashland Wiseman, [place], OTR/L 08/08/2022, 3:04 PM

## 2022-08-09 ENCOUNTER — Ambulatory Visit: Payer: Medicare Other | Admitting: Physical Therapy

## 2022-08-10 NOTE — Therapy (Signed)
OUTPATIENT PHYSICAL THERAPY TREATMENT  Patient Name: Vernon [name] MRN: [id number] DOB:1946-02-19, 76 y.o., male Today's Date: 08/13/2022  PCP: Baxter Hire, MD  REFERRING PROVIDER: Baxter Hire, MD        END OF SESSION:   PT End of Session - 08/13/22 1614     Visit Number 25    Number of Visits 31    Date for PT Re-Evaluation 09/24/22    Authorization Type [employer] Medicare    Progress Note Due on Visit 29    PT Start Time 1532    PT Stop Time 1613    PT Time Calculation (min) 41 min    Equipment Utilized During Treatment Gait belt    Activity Tolerance Patient tolerated treatment well;Patient limited by pain;Patient limited by fatigue   acute L knee pain   Behavior During Therapy [facility] for tasks assessed/performed                        Past Medical History:  Diagnosis Date   Anemia    Aortic stenosis    Arthritis    Complication of anesthesia    hard time getting bp up after knee replacement   Diabetes mellitus without complication (HCC)    GERD (gastroesophageal reflux disease)    occ tums prn   History of hiatal hernia    Hypertension    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[place])    Presence of Watchman left atrial appendage closure device 07/26/2022   83mm Watchman FLX with Dr. Quentin Ore   Past Surgical History:  Procedure Laterality Date   CARPAL TUNNEL RELEASE  2012   CRANIOTOMY N/A 02/10/2022   Procedure: SUBOCCIPITAL CRANIECTOMY FOR EVACUATION OF CEREBELLAR HEMATOMA;  Surgeon: Kristeen Miss, MD;  Location: [place];  Service: Neurosurgery;  Laterality: N/A;   JOINT REPLACEMENT Right 2010   LEFT ATRIAL APPENDAGE OCCLUSION N/A 07/26/2022   Procedure: LEFT ATRIAL APPENDAGE OCCLUSION;  Surgeon: Vickie Epley, MD;  Location: [place] [facility];  Service: Cardiovascular;  Laterality: N/A;   SHOULDER ARTHROSCOPY WITH ROTATOR CUFF REPAIR AND OPEN BICEPS TENODESIS Right 11/09/2019   Procedure: RIGHT SHOULDER ARTHROSCOPY WITH SUBSCAPULARIS REPAIR,  SUBACROMIAL DECOMPRESSION,MINI OPEN ROTATOR CUFF REPAIR;  Surgeon: Leim Fabry, MD;  Location: [facility];  Service: Orthopedics;  Laterality: Right;   TEE WITHOUT CARDIOVERSION N/A 07/26/2022   Procedure: TRANSESOPHAGEAL ECHOCARDIOGRAM (TEE);  Surgeon: Vickie Epley, MD;  Location: [place] [facility];  Service: Cardiovascular;  Laterality: N/A;   Patient Active Problem List   Diagnosis Date Noted   Atrial fibrillation ([place]) 07/26/2022   Presence of Watchman left atrial appendage closure device 07/26/2022   Proteinuria, unspecified 05/23/2022   Simple renal cyst 05/23/2022   Long term current use of anticoagulant 04/04/2022   Rotator cuff syndrome 04/04/2022   Exposure to potentially hazardous substance 04/04/2022   Gout 04/04/2022   Osteoarthritis 04/04/2022   Osteoporosis 04/04/2022   Other specified diseases of hair and hair follicles 88/32/5498   Pain in joint, lower leg 04/04/2022   Problem related to unspecified psychosocial circumstances 04/04/2022   General medical examination for administrative purposes 04/04/2022   Stage 3b chronic kidney disease ([place])    Dysphagia, post-stroke    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 hemorrhage of brain ([place]) 02/26/2022   Cough 26/41/5830   Acute metabolic encephalopathy 94/05/6807   Obstructive hydrocephalus ([place]) 02/19/2022   Dyslipidemia 02/19/2022   Dysphagia 02/19/2022   AKI (acute kidney injury) ([place])    Hyperkalemia    Anemia  Pleural effusion on right    Respiratory failure requiring intubation (HCC)    Cerebral edema (HCC)    Chronic atrial fibrillation (HCC)    Controlled type 2 diabetes mellitus with hyperglycemia, without long-term current use of insulin (HCC)    ICH (intracerebral hemorrhage) ([place]) 02/10/2022   Intracranial hemorrhage (HCC)    Anticoagulated    Moderate tricuspid regurgitation 07/26/2021   Moderate aortic valve stenosis 03/08/2020   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[place]) 02/18/2020   Macrocytic anemia 01/13/2020    Spleen enlargement 01/13/2020   Bilateral carotid artery stenosis 07/14/2018   Atrial fibrillation, chronic ([place]) 07/14/2018   Type 2 diabetes with nephropathy ([place]) 02/14/2016   Essential hypertension 08/16/2014   Hyperlipemia, mixed 08/16/2014   Renal insufficiency 08/16/2014    REFERRING DIAG: I62.9 (ICD-10-CM) - Nontraumatic intracranial hemorrhage, unspecified   THERAPY DIAG:  Unsteadiness on feet  Muscle weakness (generalized)  Other abnormalities of gait and mobility  Difficulty in walking, not elsewhere classified  Rationale for Evaluation and Treatment Rehabilitation  PERTINENT HISTORY: see MAR  PRECAUTIONS: fall risk  SUBJECTIVE: Saw the ENT for his throat and that appointment went will. Dc'd by speech therapy today. Accompanied by wife.   PAIN:  Are you having pain? NO: NPRS scale: 3/10 Pain location: L knee-inferior aspect and medial to knee Pain description: soreness Aggravating factors: rotating in and out of foot Relieving factors: arthritis medication    OBJECTIVE:      TODAY'S TREATMENT: 08/13/22 Activity Comments  FOTO [phone]  Stair navigation  Alternating reciprocal with 1 and B handrails with good control; discouraged going up/down sideways/backwards   Curbs with RW Cueing to slow down but no LOB of instability   MCTSIB #3 30" Mild-moderate sway  MCTSIB #4 30" Severe sway     [facility] PT Assessment - 08/13/22 0001       Berg Balance Test   Sit to Stand Able to stand without using hands and stabilize independently    Standing Unsupported Able to stand safely 2 minutes    Sitting with Back Unsupported but Feet Supported on Floor or Stool Able to sit safely and securely 2 minutes    Stand to Sit Sits safely with minimal use of hands    Transfers Able to transfer with verbal cueing and /or supervision   LOB into chair   Standing Unsupported with Eyes Closed Able to stand 10 seconds safely    Standing Unsupported with Feet Together Able to  place feet together independently and stand for 1 minute with supervision    From Standing, Reach Forward with Outstretched Arm Can reach forward >12 cm safely (5")    From Standing Position, Pick up Object from [place] to pick up shoe safely and easily    From Standing Position, Turn to Look Behind Over each Shoulder Looks behind from both sides and weight shifts well    Turn 360 Degrees Able to turn 360 degrees safely but slowly    Standing Unsupported, Alternately Place Feet on Step/Stool Able to complete 4 steps without aid or supervision   completed 8 reps with LOB to L side each side but able to self-correct   Standing Unsupported, One Foot in [employer] balance while stepping or standing    Standing on One Leg Unable to try or needs assist to prevent fall    Total Score 40               PATIENT EDUCATION: Education details: edu on today's exam findings  and plans going forward, edu on need to maintain fitness with HEP or walking program with supervision upon DC Person educated: Patient and Spouse Education method: Explanation Education comprehension: verbalized understanding     HOME EXERCISE PROGRAM Last updated: 08/06/22 Access Code: [email or web address] URL: [email or web address] Date: 08/06/2022 Prepared by: [facility]  Program Notes Perform standing exercises with supervision only!  Exercises - Seated Hip Abduction  - 1 x daily - 5 x weekly - 2 sets - 10 reps - Seated Long Arc Quad  - 1 x daily - 5 x weekly - 2 sets - 10 reps - Sit to Stand with Counter Support  - 1 x daily - 5 x weekly - 2 sets - 10 reps - Standing Hip Abduction with Counter Support  - 1 x daily - 5 x weekly - 2 sets - 10 reps - Standing Alternating Knee Flexion with Ankle Weights  - 1 x daily - 5 x weekly - 2 sets - 10 reps - Forward Backward Weight Shift with Counter Support  - 1 x daily - 5 x weekly - 2 sets - 10  reps   -------------------------------------------------------------------------------------------------------------------------   From eval  DIAGNOSTIC FINDINGS: see MAR   COGNITION: Overall cognitive status: Within functional limits for tasks assessed             SENSATION: WFL   COORDINATION: Bradykinesia, difficulty wit rapid alternating movements     MUSCLE TONE: WFL       POSTURE: No Significant postural limitations   LOWER EXTREMITY ROM:      WFL  (Blank rows = not tested)   LOWER EXTREMITY MMT:     MMT Right Eval Left Eval  Hip flexion 5 4  Hip extension 5 3+  Hip abduction 5 3-  Hip adduction      Hip internal rotation      Hip external rotation      Knee flexion 5 4  Knee extension 5 4  Ankle dorsiflexion 5 4+  Ankle plantarflexion      Ankle inversion      Ankle eversion      (Blank rows = not tested)   BED MOBILITY:  Independent   TRANSFERS: Assistive device utilized: Environmental consultant - 2 wheeled  Sit to stand: Modified independence Stand to sit: Modified independence Chair to chair: SBA and CGA Floor:  DNT   RAMP:  Not available   CURB:  Level of Assistance: SBA and CGA Assistive device utilized: Environmental consultant - 2 wheeled Curb Comments: undershoots with LLE   STAIRS:           Level of Assistance: SBA and CGA           Stair Negotiation Technique: Alternating Pattern  with Bilateral Rails           Number of Stairs: 6             Height of Stairs: 4-6"            Comments: dysmetria w/ LLE   GAIT: Gait pattern: circumduction- Left and ataxic Distance walked: 100 Assistive device utilized: Walker - 2 wheeled Level of assistance: SBA Comments: dysmetria LLE   FUNCTIONAL TESTs:  Timed up and go (TUG): 20.25 with RW Berg Balance Scale: 35/56      M-CTSIB  Condition 1: Firm Surface, EO 30 Sec, Mild Sway  Condition 2: Firm Surface, EC 30 Sec, Mild and Moderate Sway  Condition 3: Foam Surface, EO  9 Sec, Severe Sway  Condition 4: Foam  Surface, EC 0 Sec,  unable  Sway     07/02/22 M-CTSIB  Condition 1: Firm Surface, EO 30 Sec, Mild and Moderate Sway  Condition 2: Firm Surface, EC 30 Sec, Moderate Sway * required RW support to get into romberg  Condition 3: Foam Surface, EO 30 Sec, Severe Sway * required RW support to get into romberg  Condition 4: Foam Surface, EC NT Sec, NT Sway          PATIENT SURVEYS:  FOTO 54.43% functional status   TODAY'S TREATMENT:  Evaluation, sidestepping along counter, tandem stance at counter     PATIENT EDUCATION: Education details: scope of PT intervention Person educated: Patient and Spouse Education method: Explanation Education comprehension: verbalized understanding     HOME EXERCISE PROGRAM: sidestepping along counter, tandem stance at counter   -----------------------------------------------------------------------------------------------------------------------    GOALS: Goals reviewed with patient? Yes   SHORT TERM GOALS: Target date: 05/29/2022   Patient will perform HEP with family/caregiver supervision for improved strength, balance, transfers, and gait  Baseline: Goal status: MET   2.  Patient will achieve 15 seconds for TUG test to manifest reduced risk for falls Baseline: 20.25 with RW; 14 sec with RW Goal status: MET   3. Demo modified independent gait level surfaces and curb negotiation            Baseline: CGA-SBA with RW; unchanged 07/02/22; SBA 08/13/22            Goal status: On-going 08/13/22   LONG TERM GOALS: Target date: 09/24/2022   Patient will demonstrate score 45/56 Berg Balance Test to manifest low risk for falls Baseline: 35/56; 37/56 on 06/06/22; 37/56 07/02/22; 40/56 08/13/22 Goal status: On-going   2.  Demonstrate improved static balance and postural control per time of 15 sec condition 4 M-CTSIB to improve safety with mobility on uneven surfaces Baseline: complete LOB left with condition 3; NT d/t safety 07/02/22; 8/21/203 15.66  sec, 10 sec Goal status: MET 08/13/22   3.  Demo modified independent gait using least restrictive AD over various surfaces and ascend/descend stairs with set-up assist to improve functional mobility Baseline: able to navigate stairs with B handrail with CGA 07/02/22; able with 1 handrail 08/13/22 Goal status: MET 08/13/22   4.  Demo score of 57% functional status FOTO  Baseline: 54.43%; 57% 07/02/22 Goal status: MET       ASSESSMENT:   CLINICAL IMPRESSION:  Patient arrived to session with report of being discharged by ST today. Patient scored 40/56 on Berg, indicating increased risk of falls but improved since last measurement. Most difficulty with narrow BOS and transfers. Patient demonstrated sideways/backwards stair navigation with 1 handrail; adjusted to normal alternating reciprocal pattern which was safer and encouraged this pattern going forward. Able to negotiate curbs with walker with SBA as patient still moves quickly. Able to tolerated 30 sec on foam with EC today without LOB. Patient continues to demonstrate slow but steady improvements in stability. Would benefit from additional skilled PT services 1x/week for 6weeks to address remaining goals and safety concerns.    OBJECTIVE IMPAIRMENTS Abnormal gait, decreased activity tolerance, decreased balance, decreased coordination, decreased endurance, decreased knowledge of use of DME, difficulty walking, decreased strength, impaired perceived functional ability, and improper body mechanics.    ACTIVITY LIMITATIONS carrying, lifting, bending, standing, squatting, stairs, transfers, and locomotion level   PARTICIPATION LIMITATIONS: meal prep, cleaning, laundry, driving, shopping, community activity, and yard work   PERSONAL  FACTORS Age, Fitness, and Time since onset of injury/illness/exacerbation are also affecting patient's functional outcome.    REHAB POTENTIAL: Excellent   CLINICAL DECISION MAKING: Evolving/moderate  complexity   EVALUATION COMPLEXITY: Moderate   PLAN: PT FREQUENCY: 1x/week   PT DURATION: 6 weeks   PLANNED INTERVENTIONS: Therapeutic exercises, Therapeutic activity, Neuromuscular re-education, Balance training, Gait training, Patient/Family education, Joint mobilization, Stair training, Vestibular training, Canalith repositioning, DME instructions, Aquatic Therapy, Electrical stimulation, Wheelchair mobility training, and Manual therapy   PLAN FOR NEXT SESSION:  continue with narrow BOS, safe transfers, update HEP with something patient can do safely at home upon D/C    Janene Harvey, PT, DPT 08/13/22 5:07 PM  [facility] [address], [place], [place] [address] Phone # [phone] Fax # [phone]

## 2022-08-13 ENCOUNTER — Ambulatory Visit: Payer: Medicare Other

## 2022-08-13 ENCOUNTER — Encounter: Payer: Self-pay | Admitting: Physical Therapy

## 2022-08-13 ENCOUNTER — Ambulatory Visit: Payer: Medicare Other | Admitting: Physical Therapy

## 2022-08-13 DIAGNOSIS — R2689 Other abnormalities of gait and mobility: Secondary | ICD-10-CM

## 2022-08-13 DIAGNOSIS — R2681 Unsteadiness on feet: Secondary | ICD-10-CM | POA: Diagnosis not present

## 2022-08-13 DIAGNOSIS — R262 Difficulty in walking, not elsewhere classified: Secondary | ICD-10-CM

## 2022-08-13 DIAGNOSIS — R471 Dysarthria and anarthria: Secondary | ICD-10-CM

## 2022-08-13 DIAGNOSIS — M6281 Muscle weakness (generalized): Secondary | ICD-10-CM

## 2022-08-13 DIAGNOSIS — R41841 Cognitive communication deficit: Secondary | ICD-10-CM

## 2022-08-13 NOTE — Therapy (Signed)
OUTPATIENT SPEECH LANGUAGE PATHOLOGY TREATMENT NOTE/ [place] SUMMARY   Patient Name: Vernon [name] MRN: [id number] DOB:12-08-1945, 76 y.o., male Today's Date: 08/14/2022  PCP: Baxter Hire, MD  REFERRING PROVIDER: Baxter Hire, MD   END OF SESSION:   End of Session - 08/14/22 1003     Visit Number 18    Number of Visits 25    Date for SLP Re-Evaluation 08/13/22    SLP Start Time 57    SLP Stop Time  12    SLP Time Calculation (min) 35 min    Activity Tolerance Patient tolerated treatment well                       Past Medical History:  Diagnosis Date   Anemia    Aortic stenosis    Arthritis    Complication of anesthesia    hard time getting bp up after knee replacement   Diabetes mellitus without complication (HCC)    GERD (gastroesophageal reflux disease)    occ tums prn   History of hiatal hernia    Hypertension    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[place])    Presence of Watchman left atrial appendage closure device 07/26/2022   62m Watchman FLX with Dr. 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;  Surgeon: EKristeen Miss MD;  [facility]  Service: Neurosurgery;  Laterality: N/A;   JOINT REPLACEMENT Right 2010   LEFT ATRIAL APPENDAGE OCCLUSION N/A 07/26/2022   Procedure: LEFT ATRIAL APPENDAGE OCCLUSION;  Surgeon: LVickie Epley MD;  [facility];  Service: Cardiovascular;  Laterality: N/A;   SHOULDER ARTHROSCOPY WITH ROTATOR CUFF REPAIR AND OPEN BICEPS TENODESIS Right 11/09/2019   Procedure: RIGHT SHOULDER ARTHROSCOPY WITH SUBSCAPULARIS REPAIR, SUBACROMIAL DECOMPRESSION,MINI OPEN ROTATOR CUFF REPAIR;  Surgeon: PLeim Fabry MD;  Location: [facility];  Service: Orthopedics;  Laterality: Right;   TEE WITHOUT CARDIOVERSION N/A 07/26/2022   Procedure: TRANSESOPHAGEAL  ECHOCARDIOGRAM (TEE);  Surgeon: LVickie Epley MD;  [facility];  Service: Cardiovascular;  Laterality: N/A;   Patient Active Problem List   Diagnosis Date Noted   Atrial fibrillation ([facility] 07/26/2022   Presence of Watchman left atrial appendage closure device 07/26/2022   Proteinuria, unspecified 05/23/2022   Simple renal cyst 05/23/2022   Long term current use of anticoagulant 04/04/2022   Rotator cuff syndrome 04/04/2022   Exposure to potentially hazardous substance 04/04/2022   Gout 04/04/2022   Osteoarthritis 04/04/2022   Osteoporosis 04/04/2022   Other specified diseases of hair and hair follicles 038/88/7579  Pain in joint, lower leg 04/04/2022   Problem related to unspecified psychosocial circumstances 04/04/2022   General medical examination for administrative purposes 04/04/2022   Stage 3b chronic kidney disease ([facility]    Dysphagia, post-stroke    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 hemorrhage of brain ([facility] 02/26/2022   Cough 072/82/0601  Acute metabolic encephalopathy 056/15/3794  Obstructive hydrocephalus ([facility] [place] 02/19/2022   Dyslipidemia 02/19/2022   Dysphagia 02/19/2022   AKI (acute kidney injury) ([facility]    Hyperkalemia    Anemia    Pleural effusion on right    Respiratory failure requiring intubation (HCC)    Cerebral edema (HCC)    Chronic atrial fibrillation ([place]    Controlled type 2 diabetes mellitus with hyperglycemia, without long-term current use of insulin ([facility]  ICH (intracerebral hemorrhage) ([place]) 02/10/2022   Intracranial hemorrhage (HCC)    Anticoagulated    Moderate tricuspid regurgitation 07/26/2021   Moderate aortic valve stenosis 03/08/2020   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[place]) 02/18/2020   Macrocytic anemia 01/13/2020   Spleen enlargement 01/13/2020   Bilateral carotid artery stenosis 07/14/2018   Atrial fibrillation, chronic ([place]) 07/14/2018   Type 2 diabetes with nephropathy ([place]) 02/14/2016   Essential hypertension 08/16/2014    Hyperlipemia, mixed 08/16/2014   Renal insufficiency 08/16/2014   SPEECH THERAPY RENEWAL-DISCHARGE SUMMARY  Visits from Start of Care: 18  Current functional level related to goals / functional outcomes:  See below. Pt's LTGs will be enforced today and then pt will be d/c'd.  Remaining deficits: Mild dysphonia, mild/baseline cognitive deficits.   Education / Equipment: PhoRTE exercises, cognitive compensations, vocal hygiene measures.    Patient agrees to discharge. Patient goals were partially met. Patient is being discharged due to being pleased with the current functional level.Marland Kitchen       PERTINENT HISTORY:  March 2023 experienced hemorrhage in cerebellar peduncle and underwent craniotomy for evacuation. Notes his LUE and LLE are most affected at present. Underwent acute hospitalization, and [facility]. Recently D/C from home health on 04/30/22. Did not receive HHST, only HHPT/OT.     ONSET DATE: 02/10/22   REFERRING DIAG: I62.9 (ICD-10-CM) - Nontraumatic intracranial hemorrhage, unspecified   THERAPY DIAG:  Dysarthria and anarthria  Rationale for Evaluation and Treatment Rehabilitation  SUBJECTIVE: [name] attended ST with pt today. "Well, the ENT said the vocal cords are both moving but one is weak."   PAIN:  Are you having pain? No   OBJECTIVE:   TODAY'S TREATMENT: 08/13/22: ENT exam 08/10/22 revealed mobile vocal folds but unilateral weakness affecting vocal quality/strength. ENT stated pt's best option at this time was to wait for cont'd recovery. SLP reviewed pt's Phonation Resistance Training Exercises (PhoRTE) Exercises with him/wife and he was WNL. SLP recommends d/c at this time and pt cont with PhoRTE 4 more weeks. Pt satisfied with this. Pt/wife state pt's cognition improved dramatically (see PROM returned last session), near baseline.  07/23/22: ENT is 08/10/22 at 1400, with Dr. Richardson Landry. Pt completed Phonation Resistance Training Exercises (PhoRTE) with  independence. Pt completed with /a/ - 5 high pitched and 5 regular speech frequency /a/. Pt with intermittent voicing with decr'd breath support with WNL speech pitched /a/ but stronger and consistent voicing with high pitched /a/. SLP discussed rationale for ENT eval and that if pt performs PhoRTE until then pt will likely have had all vocal fold strength return that is possible at this time. SLP put ST on hold until ENT eval. As at initial recording [name] completed PROM with cognitive questionnaire - she scored pt 129, > 101 initially.  8/21/23[name] states ENT appointment has been made but [name] has not told him details as of currently. Pt performed PHOrTE exercises today with mod IConstance Holster did not demo fatigue with /a/, nor with numbers low-high-low. Sentences, he demo'd fatigue with numbers at 8. SLP shaped pt's /a/ for lower pitch, Pt definitely had more vocal breaks at a normal speaking pitch than /a/ at a high pitch. He req'd min A rarely for loudness and then was mod I. SLP told pt to perform 5 /a/ with high pitch, then 5 at speaking pitch, then 2-3 more /a/ at high pitch this next week.   8/14/23[name] reported the ENT appointment is in process. Questions about his therapy schedule were answered  with excellent success. Pt told SLP about upcoming appointments, correctly, with intermittent use of calendar. [name] performed PHOrTE exercises with modified independence and stated, "I've seen some good progress with my voice." Pt  fatigued following 8 reps of /a/, 7-8 reps of numbers, after 7-8 high-pitched sentences, and did not appear to fatigue with low-pitched sentences. Pt agreed that two more sessions were appropriate, once/week.  07/04/22: Pt is unsure if ENT appointment has been made yet. Today pt answered questions about his schedule with 95% accuracy. He performed his PHOrTE exercises with rare min A. SLP and pt agreed he could decr to once/week. Verbally, pt told me three sequences with WNL  organization and memory.  8/7/23[name] entered tx room and correctly told SLP appointments today and next therapy day (Wednesday). He told SLP challenge with checkbook is with legibility due to spaces being too thin so he is using two spaces. SLP agreed with pt with this. He performed PHOrTE HEP with SBA with pitch variability with numbers. SLP told pt SLP is recommending ENT consult due to voice still not WNL - but it has improved since initial eval. SLP got two names of ENTs in [place] area as pt is from [employer]. SLP provided these names for pt and asked pt to show wife, which SLP learned he did after his last tx session today, when wife arrived.      PATIENT EDUCATION: Education details: See "treatment" above Person educated: Patient Education method: Explanation, demo, Verbal cues Education comprehension: verbalized understanding, returned demonstration, verbal cues required, and needs further education      GOALS: Goals reviewed with patient? Yes   SHORT TERM GOALS: Target date: 05/31/22 Pt will complete HEP for voice/dysarthria with occasional min A over 2 sessions Baseline: 05/24/22 Goal status: Met   2.  Pt will use external aids to recall daily events/schedule with occasional min A from spouse over 1 week Baseline:  Goal status: Deferred   3.  Pt will achieve clear phonation 18/20 sentences in structured task Baseline:  Goal status: Not met   4.  Pt will ID dysnomic errors and self correct with rare min A over 2 sessions Baseline:  Goal status: Deferred     LONG TERM GOALS: Target date: 08/13/22 Pt will achieve clear phonation over 10 minute conversation with rare min A over 2 sessions Baseline:  Goal status: Not met upon d/c   2.  Pt will independently recall weekly/daily schedule, appointments events and pertinent information with memory aids over 2 sessions Baseline: 07-02-22 Goal status: Met   3.  Pt will complete vocal warm ups and follow 3 vocal hygiene  strategies to return to singing for 5-10 minute periods with rare min A Baseline:  Goal status: Not met upon d/c   4.  Improve score on Cognitive function PROM by 4 points Baseline: 101- filled out by spouse Goal status: Met   ASSESSMENT:   CLINICAL IMPRESSION: LTGs enforced today's session and then d/c. Patient is a 76 y.o. male who is seen for dysarthria/voice and cognitive linguistics s/p CVA. See tx note. Pt and wife report significant improvement in cognitive linguistics. Pt reports ENT appointment 08/10/22 revealed both folds are mobile, but one is weaker - wait and see approach was recommended, which pt is pleased with. Zakhai is a Therapist, nutritional and sings for fun and at church, so I also educated today on Insurance underwriter. [name] and SLP agreed on d/c. SLP recommended [name] and [name] continuing with PHOrTE 4 more  weeks.    OBJECTIVE IMPAIRMENTS dysarthria, and voice disorder. These impairments are limiting patient from managing appointments, household responsibilities, and effectively communicating at home and in community. Factors affecting potential to achieve goals and functional outcome are ability to learn/carryover information.. Patient will benefit from skilled SLP services to address above impairments and improve overall function.   REHAB POTENTIAL: Good   PLAN: Discharge after session today.   PLANNED INTERVENTIONS: Language facilitation, Environmental controls, Cueing hierachy, Cognitive reorganization, Internal/external aids, Functional tasks, Multimodal communication approach, and SLP instruction and feedback         [place], [place] 08/14/2022, 10:04 AM

## 2022-08-13 NOTE — Patient Instructions (Signed)
VOICE CONSERVATION PROGRAM  1. Avoid overuse of voice or excessive use of the voice The vocal cords can become easily fatigued. Try to sort out what is "necessary" versus "unnecessary" talking in your environment. You do not need to STOP talking, but try to limit it as much as possible.  Think of resting your voice just as long as you talk. For example, if you talk for 5 minutes, rest your voice completely for 5 minutes. If your voice feels "tired" during the day, try to rest it as much as possible.  Avoid using an excessively loud voice or shouting/raising your voice When you yell or raise your voice, the vocal cords slam into each other, much like a strong hand clap. This causes irritation, and if this irritation continues, hoarseness may increase. If people in your home talk loudly, ask them to reduce the volume of their voices to help you decrease your volume as well. Sit near or face the person to whom you are speaking.   3. Avoid talking over background noise When talking in background noise, speech automatically has increased loudness, and as in number 2 above, continued loud speech can result in increased hoarseness due to irritation to the vocal cords.  Do not talk over the radio or the TV. Mute them before speaking, go to a quieter place to talk, or sit next to the person with whom you are watching.  4. Talk in a voice that is soft, smooth, and gentle This allows the vocal cords to come together in a gentle way and allows the air being exhaled from the lungs to do most/all of the work when you are speaking.  5. Avoid excessive throat clearing, coughing, and loud laughter During these activities the vocal cords can also be slammed together in a hard way and foster irritation or swelling, which can cause hoarseness. Try taking sips of room temperature/cool water with hard swallows to clear secretions from the throat, instead of throat clearing or coughing. If you absolutely must  clear your throat, do so as gently as possible. If you find yourself clearing your throat or coughing a lot, consult your physician. You will want to laugh. Continue to do so, but softly and gently. Do not laugh loudly for long periods of time because this can increase the chances for vocal fold irritation and thus hoarseness. Lozenges can help reduce the need to clear throat/cough during cold/allergy season.  6. Keep the mouth and throat lubricated Drink at least 8-10 8 oz. glasses of water per day (64-80 oz.). This water can come in the form of drink mixes.  Caffeinated beverages such as colas, coffee, and tea (hot tea AND sweet tea) dry out the vocal cords and then can cause irritation and thus hoarseness.  Drinking water will keep your body hydrated. This will help to decrease secretions in the throat, which cause people to clear their throats or cough. If you are exercising outside (especially during drier weather), try to breathe through your nose as much as possible. If this is not possible, lifting the tongue up behind the upper teeth when breathing through the mouth will add some moisture to the air breathed in past the vocal cords.  7. Avoid mouth breathing - breathe through your nose Your nose serves as a natural filter for dust and dirt particles from the air, and as a humidifier to moisten the air. Vocal cords like to work in moistened, filtered air. When you breathe through your mouth you lose   the air filtering and moistening benefits of breathing through the nose. Be aware of breathing patterns when sitting quietly (e.g., reading or watching TV). Increase your awareness and try to change habits from mouth breathing to nose breathing during those times.  8. Avoid environmental and/or ingested irritants Try to avoid smoke-filled and or dusty environments. These items dry out the vocal cords and cause irritation.  9. Use an air filter if the home is dusty, and/or a humidifier if the air  is dry  This will help to maintain clean, humid air to breathe. Remember, this type of air is what the vocal cords like best.  

## 2022-08-16 ENCOUNTER — Ambulatory Visit: Payer: Medicare Other | Admitting: Physical Therapy

## 2022-08-20 ENCOUNTER — Ambulatory Visit: Payer: Medicare Other

## 2022-08-20 ENCOUNTER — Telehealth: Payer: Self-pay | Admitting: Cardiology

## 2022-08-20 DIAGNOSIS — I69254 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: Secondary | ICD-10-CM

## 2022-08-20 DIAGNOSIS — R2681 Unsteadiness on feet: Secondary | ICD-10-CM | POA: Diagnosis not present

## 2022-08-20 DIAGNOSIS — M6281 Muscle weakness (generalized): Secondary | ICD-10-CM

## 2022-08-20 DIAGNOSIS — R2689 Other abnormalities of gait and mobility: Secondary | ICD-10-CM

## 2022-08-20 DIAGNOSIS — R262 Difficulty in walking, not elsewhere classified: Secondary | ICD-10-CM

## 2022-08-20 NOTE — Telephone Encounter (Signed)
*  STAT* If patient is at the pharmacy, call can be transferred to refill team.   1. Which medications need to be refilled? (please list name of each medication and dose if known)   apixaban (ELIQUIS) 2.5 MG TABS tablet  2. Which pharmacy/location (including street and city if local pharmacy) is medication to be sent to?  [id number]- [place], [place]- [address]. Do they need a 30 day or 90 day supply?  15 days  Wife stated the patient was only given 30 days of this medication after his surgery and he was told to be on the medication for 45 days.  Wife also stated patient has Eliquis 5 mg tablets on hand and wants to know if he could cut these tablets in half instead of the 2.5 mg tablets.

## 2022-08-20 NOTE — Therapy (Signed)
OUTPATIENT PHYSICAL THERAPY TREATMENT  Patient Name: Vernon [name] MRN: [id number] DOB:02-01-1946, 76 y.o., male Today's Date: 08/20/2022  PCP: Baxter Hire, MD  REFERRING PROVIDER: Baxter Hire, MD        END OF SESSION:   PT End of Session - 08/20/22 1613     Visit Number 26    Number of Visits 31    Date for PT Re-Evaluation 09/24/22    Authorization Type [employer] Medicare    Progress Note Due on Visit 29    PT Start Time 1615    PT Stop Time 1700    PT Time Calculation (min) 45 min    Equipment Utilized During Treatment Gait belt    Activity Tolerance Patient tolerated treatment well;Patient limited by pain;Patient limited by fatigue   acute L knee pain   Behavior During Therapy [facility] for tasks assessed/performed                        Past Medical History:  Diagnosis Date   Anemia    Aortic stenosis    Arthritis    Complication of anesthesia    hard time getting bp up after knee replacement   Diabetes mellitus without complication (HCC)    GERD (gastroesophageal reflux disease)    occ tums prn   History of hiatal hernia    Hypertension    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[place])    Presence of Watchman left atrial appendage closure device 07/26/2022   73m Watchman FLX with Dr. 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;  Surgeon: EKristeen Miss MD;  [facility]  Service: Neurosurgery;  Laterality: N/A;   JOINT REPLACEMENT Right 2010   LEFT ATRIAL APPENDAGE OCCLUSION N/A 07/26/2022   Procedure: LEFT ATRIAL APPENDAGE OCCLUSION;  Surgeon: LVickie Epley MD;  [facility];  Service: Cardiovascular;  Laterality: N/A;   SHOULDER ARTHROSCOPY WITH ROTATOR CUFF REPAIR AND OPEN BICEPS TENODESIS Right 11/09/2019   Procedure: RIGHT SHOULDER ARTHROSCOPY WITH SUBSCAPULARIS REPAIR,  SUBACROMIAL DECOMPRESSION,MINI OPEN ROTATOR CUFF REPAIR;  Surgeon: PLeim Fabry MD;  Location: [facility];  Service: Orthopedics;  Laterality: Right;   TEE WITHOUT CARDIOVERSION N/A 07/26/2022   Procedure: TRANSESOPHAGEAL ECHOCARDIOGRAM (TEE);  Surgeon: LVickie Epley MD;  [facility];  Service: Cardiovascular;  Laterality: N/A;   Patient Active Problem List   Diagnosis Date Noted   Atrial fibrillation ([place] 07/26/2022   Presence of Watchman left atrial appendage closure device 07/26/2022   Proteinuria, unspecified 05/23/2022   Simple renal cyst 05/23/2022   Long term current use of anticoagulant 04/04/2022   Rotator cuff syndrome 04/04/2022   Exposure to potentially hazardous substance 04/04/2022   Gout 04/04/2022   Osteoarthritis 04/04/2022   Osteoporosis 04/04/2022   Other specified diseases of hair and hair follicles 062/70/3500  Pain in joint, lower leg 04/04/2022   Problem related to unspecified psychosocial circumstances 04/04/2022   General medical examination for administrative purposes 04/04/2022   Stage 3b chronic kidney disease ([place]    Dysphagia, post-stroke    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 hemorrhage of brain ([place] 02/26/2022   Cough 093/81/8299  Acute metabolic encephalopathy 037/16/9678  Obstructive hydrocephalus ([place] 02/19/2022   Dyslipidemia 02/19/2022   Dysphagia 02/19/2022   AKI (acute kidney injury) ([place]    Hyperkalemia    Anemia  Pleural effusion on right    Respiratory failure requiring intubation (HCC)    Cerebral edema (HCC)    Chronic atrial fibrillation (HCC)    Controlled type 2 diabetes mellitus with hyperglycemia, without long-term current use of insulin (HCC)    ICH (intracerebral hemorrhage) ([place]) 02/10/2022   Intracranial hemorrhage (HCC)    Anticoagulated    Moderate tricuspid regurgitation 07/26/2021   Moderate aortic valve stenosis 03/08/2020   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[place]) 02/18/2020   Macrocytic anemia 01/13/2020    Spleen enlargement 01/13/2020   Bilateral carotid artery stenosis 07/14/2018   Atrial fibrillation, chronic ([place]) 07/14/2018   Type 2 diabetes with nephropathy ([place]) 02/14/2016   Essential hypertension 08/16/2014   Hyperlipemia, mixed 08/16/2014   Renal insufficiency 08/16/2014    REFERRING DIAG: I62.9 (ICD-10-CM) - Nontraumatic intracranial hemorrhage, unspecified   THERAPY DIAG:  Unsteadiness on feet  Muscle weakness (generalized)  Other abnormalities of gait and mobility  Difficulty in walking, not elsewhere classified  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 NO: NPRS scale: 3/10 Pain location: L knee-inferior aspect and medial to knee Pain description: soreness Aggravating factors: rotating in and out of foot Relieving factors: arthritis medication    OBJECTIVE:    TODAY'S TREATMENT: 08/20/22 Activity Comments  Alt stair taps 2x60 sec BUE support, 2x60 sec no UE. Demo LOB to the left with stance phase, use of half bolster to enforce wider BOS  Foot on step on balance 1x60 sec no UE support and hitting targets that arise in periphery  Sidestepping  1x2 min BUE support, 1x2 min no support   Forwards/backwards walking In // bars balancing tennis ball on tray  Standing PNF chops green medicine ball 1x10 wide BOS, 1x10 semi-tandem  Standing ball toss 1x10 green med ball       TODAY'S TREATMENT: 08/13/22 Activity Comments  FOTO [phone]  Stair navigation  Alternating reciprocal with 1 and B handrails with good control; discouraged going up/down sideways/backwards   Curbs with RW Cueing to slow down but no LOB of instability   MCTSIB #3 30" Mild-moderate sway  MCTSIB #4 30" Severe sway          PATIENT EDUCATION: Education details: edu on today's exam findings and plans going forward, edu on need to maintain fitness with HEP or walking program with supervision upon DC Person educated: Patient and Spouse Education method: Explanation Education comprehension: verbalized understanding     HOME EXERCISE PROGRAM Last updated: 08/06/22 Access Code: [email or web address] URL: [email or web address] Date: 08/06/2022 Prepared by: [facility]  Program Notes Perform standing exercises with supervision only!  Exercises - Seated Hip Abduction  - 1 x daily - 5 x weekly - 2 sets - 10 reps - Seated Long Arc Quad  - 1 x daily - 5 x weekly - 2 sets - 10 reps - Sit to Stand with Counter Support  - 1 x daily - 5 x weekly - 2 sets - 10 reps - Standing Hip Abduction with Counter Support  - 1 x daily - 5 x weekly - 2 sets - 10 reps - Standing Alternating Knee Flexion with Ankle Weights  - 1 x daily - 5 x weekly - 2 sets - 10 reps - Forward  Backward Weight Shift with Counter Support  - 1 x daily - 5 x weekly - 2 sets - 10 reps   -------------------------------------------------------------------------------------------------------------------------   From eval  DIAGNOSTIC FINDINGS: see MAR   COGNITION: Overall cognitive status: Within functional limits for tasks assessed             SENSATION: WFL   COORDINATION: Bradykinesia, difficulty wit rapid alternating movements     MUSCLE TONE: WFL       POSTURE: No Significant postural limitations   LOWER EXTREMITY ROM:      WFL  (Blank rows = not tested)   LOWER EXTREMITY MMT:     MMT Right Eval Left Eval  Hip flexion 5 4  Hip extension 5 3+  Hip abduction 5 3-  Hip adduction      Hip internal rotation      Hip external rotation      Knee flexion 5 4  Knee extension 5 4  Ankle dorsiflexion 5 4+  Ankle plantarflexion      Ankle inversion      Ankle eversion       (Blank rows = not tested)   BED MOBILITY:  Independent   TRANSFERS: Assistive device utilized: Environmental consultant - 2 wheeled  Sit to stand: Modified independence Stand to sit: Modified independence Chair to chair: SBA and CGA Floor:  DNT   RAMP:  Not available   CURB:  Level of Assistance: SBA and CGA Assistive device utilized: Environmental consultant - 2 wheeled Curb Comments: undershoots with LLE   STAIRS:           Level of Assistance: SBA and CGA           Stair Negotiation Technique: Alternating Pattern  with Bilateral Rails           Number of Stairs: 6             Height of Stairs: 4-6"            Comments: dysmetria w/ LLE   GAIT: Gait pattern: circumduction- Left and ataxic Distance walked: 100 Assistive device utilized: Walker - 2 wheeled Level of assistance: SBA Comments: dysmetria LLE   FUNCTIONAL TESTs:  Timed up and go (TUG): 20.25 with RW Berg Balance Scale: 35/56      M-CTSIB  Condition 1: Firm Surface, EO 30 Sec, Mild Sway  Condition 2: Firm Surface, EC 30 Sec, Mild and Moderate Sway  Condition 3: Foam Surface, EO 9 Sec, Severe Sway  Condition 4: Foam Surface, EC 0 Sec,  unable  Sway     07/02/22 M-CTSIB  Condition 1: Firm Surface, EO 30 Sec, Mild and Moderate Sway  Condition 2: Firm Surface, EC 30 Sec, Moderate Sway * required RW support to get into romberg  Condition 3: Foam Surface, EO 30 Sec, Severe Sway * required RW support to get into romberg  Condition 4: Foam Surface, EC NT Sec, NT Sway          PATIENT SURVEYS:  FOTO 54.43% functional status   TODAY'S TREATMENT:  Evaluation, sidestepping along counter, tandem stance at counter     PATIENT EDUCATION: Education details: scope of PT intervention Person educated: Patient and Spouse Education method: Explanation Education comprehension: verbalized understanding     HOME EXERCISE PROGRAM: sidestepping along counter, tandem stance at counter    -----------------------------------------------------------------------------------------------------------------------    GOALS: Goals reviewed with patient? Yes   SHORT TERM GOALS: Target date: 05/29/2022   Patient will perform HEP with family/caregiver supervision for  improved strength, balance, transfers, and gait  Baseline: Goal status: MET   2.  Patient will achieve 15 seconds for TUG test to manifest reduced risk for falls Baseline: 20.25 with RW; 14 sec with RW Goal status: MET   3. Demo modified independent gait level surfaces and curb negotiation            Baseline: CGA-SBA with RW; unchanged 07/02/22; SBA 08/13/22            Goal status: On-going 08/13/22   LONG TERM GOALS: Target date: 09/24/2022   Patient will demonstrate score 45/56 Berg Balance Test to manifest low risk for falls Baseline: 35/56; 37/56 on 06/06/22; 37/56 07/02/22; 40/56 08/13/22 Goal status: On-going   2.  Demonstrate improved static balance and postural control per time of 15 sec condition 4 M-CTSIB to improve safety with mobility on uneven surfaces Baseline: complete LOB left with condition 3; NT d/t safety 07/02/22; 8/21/203 15.66 sec, 10 sec Goal status: MET 08/13/22   3.  Demo modified independent gait using least restrictive AD over various surfaces and ascend/descend stairs with set-up assist to improve functional mobility Baseline: able to navigate stairs with B handrail with CGA 07/02/22; able with 1 handrail 08/13/22 Goal status: MET 08/13/22   4.  Demo score of 57% functional status FOTO  Baseline: 54.43%; 57% 07/02/22 Goal status: MET       ASSESSMENT:   CLINICAL IMPRESSION:  Deficits with spatial orientation and coordination often resulting in LLE coming too close to midline and narrow BOS resulting in left LOB. Continuing to improve in safety and coordination for reaching outside BOS and crossing midline.  Use of physical cues and barrier to facilitate wider BOS for landing LLE  to good effect albeit with 10% instance of LOB vs 25% LOB w/out physical cue. Motor multi-tasking initiated with good concentration and able to maintain balance with tasks w/ intermittent UE support to correct posture   OBJECTIVE IMPAIRMENTS Abnormal gait, decreased activity tolerance, decreased balance, decreased coordination, decreased endurance, decreased knowledge of use of DME, difficulty walking, decreased strength, impaired perceived functional ability, and improper body mechanics.    ACTIVITY LIMITATIONS carrying, lifting, bending, standing, squatting, stairs, transfers, and locomotion level   PARTICIPATION LIMITATIONS: meal prep, cleaning, laundry, driving, shopping, community activity, and yard work   PERSONAL FACTORS Age, Fitness, and Time since onset of injury/illness/exacerbation are also affecting patient's functional outcome.    REHAB POTENTIAL: Excellent   CLINICAL DECISION MAKING: Evolving/moderate complexity   EVALUATION COMPLEXITY: Moderate   PLAN: PT FREQUENCY: 1x/week   PT DURATION: 6 weeks   PLANNED INTERVENTIONS: Therapeutic exercises, Therapeutic activity, Neuromuscular re-education, Balance training, Gait training, Patient/Family education, Joint mobilization, Stair training, Vestibular training, Canalith repositioning, DME instructions, Aquatic Therapy, Electrical stimulation, Wheelchair mobility training, and Manual therapy   PLAN FOR NEXT SESSION:  continue with narrow BOS, safe transfers, update HEP with something patient can do safely at home upon D/C    5:17 PM, 08/20/22 M. Sherlyn Lees, PT, DPT Physical Therapist- [place] Office Number: [phone]   [place] at [facility] [address], [place], [place] [address] Phone # [phone] Fax # [phone]

## 2022-08-21 NOTE — Telephone Encounter (Signed)
Spoke with the patient's wife.  She understands she may cut Eliquis, but to make sure to use a pill splitter since it is not easy to break.  Confirmed appointment with Kathyrn Drown on Monday 10/2. If needed and available, will request Sharee Pimple supply samples.   The patient was grateful for assistance.

## 2022-08-22 ENCOUNTER — Inpatient Hospital Stay: Payer: Medicare Other

## 2022-08-22 VITALS — BP 148/81 | HR 52

## 2022-08-22 DIAGNOSIS — D469 Myelodysplastic syndrome, unspecified: Secondary | ICD-10-CM

## 2022-08-22 DIAGNOSIS — D461 Refractory anemia with ring sideroblasts: Secondary | ICD-10-CM | POA: Diagnosis not present

## 2022-08-22 LAB — HEMOGLOBIN AND HEMATOCRIT, BLOOD
HCT: 26.4 % — ABNORMAL LOW (ref 39.0–52.0)
Hemoglobin: 8.7 g/dL — ABNORMAL LOW (ref 13.0–17.0)

## 2022-08-22 MED ORDER — DARBEPOETIN ALFA 100 MCG/0.5ML IJ SOSY
100.0000 ug | PREFILLED_SYRINGE | Freq: Once | INTRAMUSCULAR | Status: AC
  Administered 2022-08-22: 100 ug via SUBCUTANEOUS
  Filled 2022-08-22: qty 0.5

## 2022-08-22 NOTE — Progress Notes (Signed)
[facility]                                     Cardiology Office Note:    Date:  08/28/2022   ID:  Vernon [name], DOB 03/31/1946, MRN [id number]  PCP:  Vernon Hire, MD  [facility] Cardiologist:  None  [facility] Electrophysiologist:  Vernon Epley, MD   Referring MD: Vernon Hire, MD   Chief Complaint  Patient presents with   Follow-up    1 month s/p Watchman; pre CT   History of Present Illness:    Vernon [name] is a 76 y.o. male with a hx of DM2, GERD, MDS, HTN, aortic stenosis, and atrial fibrillation complication by bleeding while on anticoagulation who was recently referred to Dr. Quentin [name] for the evaluation of Watchman implantation.    Vernon [name] presented to [name] 02/10/22 with left-sided weakness and incoordination as well as nausea vomiting. 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 Eliquis was reversed and he ultimately underwent Patient suboccipital craniectomy with evacuation of cerebellar hemorrhage and placement of right parieto-occipital ventriculostomy 02/11/2022 per Dr. Ellene [name]. He did well post operatively and was discharged to a SNF. He is now back at home and he continues to participate with OP PT twice weekly.    He continued to express concern with bleeding risks associated with Eliquis and wished to discuss alternatives to anticoagulation. He was referred to EP for further consideration and discussion of the Watchman procedure. He underwent pre Watchman imaging that showed anatomy suitable for implant.   He is now s/p left atrial appendage occlusive device placement with 29m Watchman FLX device. He was restarted on Eliquis however at half dose, 2.'5mg'$  BID for 45 days (10/16) at which point he will stop Eliquis and start ASA '81mg'$  QD monotherapy. He is scheduled for repeat CT imaging 09/26/22 to ensure adequate device placement with no  leak or device thrombosis.   Today he states that he has been very well since his procedure with no chest pain, palpitations, SOB, LE edema, bleeding in stool or urine, dizziness, or syncope. We reviewed CT instructions and medication change date with understanding.   Past Medical History:  Diagnosis Date   Anemia    Aortic stenosis    Arthritis    Complication of anesthesia    hard time getting bp up after knee replacement   Diabetes mellitus without complication (HCC)    GERD (gastroesophageal reflux disease)    occ tums prn   History of hiatal hernia    Hypertension    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[place]    Presence of Watchman left atrial appendage closure device 07/26/2022   252mWatchman FLX with Dr. LaQuentin [name]  Past Surgical History:  Procedure Laterality Date   CARPAL TUNNEL RELEASE  2012   CRANIOTOMY N/A 02/10/2022   Procedure: SUBOCCIPITAL CRANIECTOMY FOR EVACUATION OF CEREBELLAR HEMATOMA;  Surgeon: ElKristeen MissMD;  [facility] Service: Neurosurgery;  Laterality: N/A;   JOINT REPLACEMENT Right 2010   LEFT ATRIAL APPENDAGE OCCLUSION N/A 07/26/2022   Procedure: LEFT ATRIAL APPENDAGE OCCLUSION;  Surgeon: Vernon EpleyMD;  [facility];  Service: Cardiovascular;  Laterality: N/A;   SHOULDER ARTHROSCOPY WITH ROTATOR CUFF REPAIR AND OPEN BICEPS TENODESIS Right 11/09/2019   Procedure: RIGHT  SHOULDER ARTHROSCOPY WITH SUBSCAPULARIS REPAIR, SUBACROMIAL DECOMPRESSION,MINI OPEN ROTATOR CUFF REPAIR;  Surgeon: Vernon Fabry, MD;  Location: [facility];  Service: Orthopedics;  Laterality: Right;   TEE WITHOUT CARDIOVERSION N/A 07/26/2022   Procedure: TRANSESOPHAGEAL ECHOCARDIOGRAM (TEE);  Surgeon: Vernon Epley, MD;  Location: [place] [facility];  Service: Cardiovascular;  Laterality: N/A;    Current Medications: Current Meds  Medication Sig   ACCU-CHEK GUIDE test strip USE 1 STRIP 3 TIMES A DAY AS INSTRUCTED   acetaminophen (TYLENOL) 650 MG CR tablet  Take 650 mg by mouth every 8 (eight) hours as needed for pain.   allopurinol (ZYLOPRIM) 100 MG tablet Take 100 mg by mouth daily as needed (gout).   apixaban (ELIQUIS) 2.5 MG TABS tablet Take 1 tablet (2.5 mg total) by mouth 2 (two) times daily.   aspirin EC 81 MG tablet Take 1 tablet (81 mg total) by mouth daily. Swallow whole. STOP ELIQUIS ON 10/15 AND START ASPIRIN 81 MG ON 10/16   metFORMIN (GLUCOPHAGE) 500 MG tablet Take 500 mg by mouth 2 (two) times daily with a meal.   Current Facility-Administered Medications for the 08/27/22 encounter (Office Visit) with [facility]  Medication   0.9 %  sodium chloride infusion   0.9 %  sodium chloride infusion     Allergies:   Patient has no known allergies.   Social History   Socioeconomic History   Marital status: Married    Spouse name: Not on file   Number of children: Not on file   Years of education: Not on file   Highest education level: Not on file  Occupational History   Not on file  Tobacco Use   Smoking status: Former    Packs/day: 0.50    Years: 8.00    Total pack years: 4.00    Types: Cigarettes    Quit date: 07/21/1978    Years since quitting: 44.1   Smokeless tobacco: Never  Vaping Use   Vaping Use: Never used  Substance and Sexual Activity   Alcohol use: Yes    Alcohol/week: 0.0 - 1.0 standard drinks of alcohol    Comment: rare beer   Drug use: Never   Sexual activity: Not on file  Other Topics Concern   Not on file  Social History Narrative   Lives in [place]; with wife; quit smoking in early 33s; ocassional/ rare [may be 1 a month beer]. 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    Social Determinants of Health   Financial Resource Strain: Not on file  Food Insecurity: Not on file  Transportation Needs: Not on file  Physical Activity: Not on file  Stress: Not on file  Social Connections: Not on file     Family History: The patient's family history includes Cancer in his  maternal uncle.  ROS:   Please see the history of present illness.    All other systems reviewed and are negative.  EKGs/Labs/Other Studies Reviewed:    The following studies were reviewed today:  Admit date: 07/26/2022 Discharge date: 07/26/2022   Primary Care Physician: Vernon Hire, MD  Primary Cardiologist: None  Electrophysiologist: Vernon Epley, MD   Primary Discharge Diagnosis:  Permanent Atrial Fibrillation Poor candidacy for long term anticoagulation due to h/o intracranial hemorrhage   Secondary Discharge Diagnosis:  -DM2 -GERD -MDS -HTN -AS   Procedures This Admission:  Transeptal Puncture Intra-procedural TEE which showed no LAA thrombus Left atrial appendage occlusive device placement on 07/26/22 by Dr. Quentin [name].  EKG:  EKG is not ordered today.    Recent Labs: 02/12/2022: B Natriuretic Peptide 854.2 02/21/2022: Magnesium 1.9 02/27/2022: ALT 24 08/27/2022: BUN 25; Creatinine, Ser 1.51; Hemoglobin 8.7; Platelets 305; Potassium 5.1; Sodium 142   Recent Lipid Panel    Component Value Date/Time   CHOL 68 02/11/2022 0423   TRIG 46 02/14/2022 0450   HDL 27 (L) 02/11/2022 0423   CHOLHDL 2.5 02/11/2022 0423   VLDL 16 02/11/2022 0423   LDLCALC 25 02/11/2022 0423    Physical Exam:    VS:  BP 130/70 (BP Location: Left Arm, Patient Position: Sitting, Cuff Size: Normal)   Pulse 62   Ht '5\' 9"'$  (1.753 m)   Wt 211 lb (95.7 kg)   SpO2 94%   BMI 31.16 kg/m     Wt Readings from Last 3 Encounters:  08/27/22 211 lb (95.7 kg)  07/26/22 200 lb (90.7 kg)  07/23/22 200 lb 6.4 oz (90.9 kg)    General: Well developed, well nourished, NAD Skin: Warm, dry, intact  Neck: Negative for carotid bruits. No JVD Lungs:Clear to ausculation bilaterally. No wheezes, rales, or rhonchi. Breathing is unlabored. Cardiovascular: RRR with S1 S2. No murmurs Extremities: No edema. Neuro: Alert and oriented. No focal deficits. No facial asymmetry. MAE spontaneously. Psych:  Responds to questions appropriately with normal affect.    ASSESSMENT/PLAN:     Permanent atrial fibrillation: Patient with hx of intracranial bleeding while on anticoagulation who was seen by Dr. Quentin [name] for Gideon implantation. He is now s/p Watchman implant with 96m FLX device. He was restarted on low dose Eliquis until 09/10/22 at which time he will transition to ASA '81mg'$  QD monotherapy. He will require 6 months of SBE prophylaxis. He wishes to defer dental cleanings until after the 6 months. He  scheduled for repeat CT imaging 09/26/22 to ensure proper device seal and no device thrombus. Obtain BMET, CBC today. Instruction letter reviewed with understanding.  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 No new neuro changes. Tolerating Eliquis 2.'5mg'$  with plans to transition to ASA 81 09/10/22.    HTN: Continue current regimen. No changes needed at this time.   Medication Adjustments/Labs and Tests Ordered: Current medicines are reviewed at length with the patient today.  Concerns regarding medicines are outlined above.   Orders Placed This Encounter  Procedures   Basic metabolic panel   CBC   Meds ordered this encounter  Medications   aspirin EC 81 MG tablet    Sig: Take 1 tablet (81 mg total) by mouth daily. Swallow whole. STOP ELIQUIS ON 10/15 AND START ASPIRIN 81 MG ON 10/16    Dispense:  90 tablet    Refill:  3    Patient Instructions  Medication Instructions:  Your physician has recommended you make the following change in your medication:  STOP ELIQUIS ON 10/15 START ASPIRIN 81 MG ON 10/16  *If you need a refill on your cardiac medications before your next appointment, please call your pharmacy*   Lab Work: TODAY: BMET, CBC If you have labs (blood work) drawn today and your tests are completely normal, you will receive your results only by: [place](if you have MyChart) OR A paper copy in the mail If you have any lab test that is abnormal or we need to  change your treatment, we will call you to review the results.   Testing/Procedures: SEE CT INSTRUCTION LETTER    Follow-Up: At [facility] you and your health needs are  our priority.  As part of our continuing mission to provide you with exceptional heart care, we have created designated Provider Care Teams.  These Care Teams include your primary Cardiologist (physician) and Advanced Practice Providers (APPs -  Physician Assistants and Nurse Practitioners) who all work together to provide you with the care you need, when you need it.  We recommend signing up for the patient portal called "MyChart".  Sign up information is provided on this After Visit Summary.  MyChart is used to connect with patients for Virtual Visits (Telemedicine).  Patients are able to view lab/test results, encounter notes, upcoming appointments, etc.  Non-urgent messages can be sent to your provider as well.   To learn more about what you can do with MyChart, go to [email or web address].    Your next appointment:   KEEP SCHEDULED APPOINTMENT   Important Information About Sugar         Signed, Kathyrn Drown, NP  08/28/2022 1:17 PM    [facility]

## 2022-08-27 ENCOUNTER — Ambulatory Visit: Payer: Medicare Other | Attending: Cardiology | Admitting: Cardiology

## 2022-08-27 ENCOUNTER — Ambulatory Visit: Payer: Medicare Other | Attending: Internal Medicine | Admitting: Physical Therapy

## 2022-08-27 ENCOUNTER — Encounter: Payer: Self-pay | Admitting: Physical Therapy

## 2022-08-27 VITALS — BP 130/70 | HR 62 | Ht 69.0 in | Wt 211.0 lb

## 2022-08-27 DIAGNOSIS — I69254 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: Secondary | ICD-10-CM | POA: Insufficient documentation

## 2022-08-27 DIAGNOSIS — Z95818 Presence of other cardiac implants and grafts: Secondary | ICD-10-CM

## 2022-08-27 DIAGNOSIS — M6281 Muscle weakness (generalized): Secondary | ICD-10-CM | POA: Diagnosis present

## 2022-08-27 DIAGNOSIS — I071 Rheumatic tricuspid insufficiency: Secondary | ICD-10-CM

## 2022-08-27 DIAGNOSIS — R2689 Other abnormalities of gait and mobility: Secondary | ICD-10-CM | POA: Diagnosis present

## 2022-08-27 DIAGNOSIS — R262 Difficulty in walking, not elsewhere classified: Secondary | ICD-10-CM | POA: Diagnosis present

## 2022-08-27 DIAGNOSIS — R2681 Unsteadiness on feet: Secondary | ICD-10-CM | POA: Diagnosis present

## 2022-08-27 DIAGNOSIS — I10 Essential (primary) hypertension: Secondary | ICD-10-CM | POA: Diagnosis not present

## 2022-08-27 DIAGNOSIS — I482 Chronic atrial fibrillation, unspecified: Secondary | ICD-10-CM | POA: Diagnosis not present

## 2022-08-27 LAB — BASIC METABOLIC PANEL
BUN/Creatinine Ratio: 17 (ref 10–24)
BUN: 25 mg/dL (ref 8–27)
CO2: 27 mmol/L (ref 20–29)
Calcium: 9.7 mg/dL (ref 8.6–10.2)
Chloride: 107 mmol/L — ABNORMAL HIGH (ref 96–106)
Creatinine, Ser: 1.51 mg/dL — ABNORMAL HIGH (ref 0.76–1.27)
Glucose: 117 mg/dL — ABNORMAL HIGH (ref 70–99)
Potassium: 5.1 mmol/L (ref 3.5–5.2)
Sodium: 142 mmol/L (ref 134–144)
eGFR: 48 mL/min/{1.73_m2} — ABNORMAL LOW (ref >59)

## 2022-08-27 LAB — CBC
Hematocrit: 25.6 % — ABNORMAL LOW (ref 37.5–51.0)
Hemoglobin: 8.7 g/dL — ABNORMAL LOW (ref 13.0–17.7)
MCH: 32.2 pg (ref 26.6–33.0)
MCHC: 34 g/dL (ref 31.5–35.7)
MCV: 95 fL (ref 79–97)
Platelets: 305 10*3/uL (ref 150–450)
RBC: 2.7 x10E6/uL — CL (ref 4.14–5.80)
RDW: 29 % — ABNORMAL HIGH (ref 11.6–15.4)
WBC: 7.7 10*3/uL (ref 3.4–10.8)

## 2022-08-27 MED ORDER — ASPIRIN 81 MG PO TBEC: 81.0000 mg | DELAYED_RELEASE_TABLET | Freq: Every day | ORAL | 3 refills | Status: DC

## 2022-08-27 NOTE — Patient Instructions (Signed)
Medication Instructions:  Your physician has recommended you make the following change in your medication:  STOP ELIQUIS ON 10/15 START ASPIRIN 81 MG ON 10/16  *If you need a refill on your cardiac medications before your next appointment, please call your pharmacy*   Lab Work: TODAY: BMET, CBC If you have labs (blood work) drawn today and your tests are completely normal, you will receive your results only by: [place] (if you have MyChart) OR A paper copy in the mail If you have any lab test that is abnormal or we need to change your treatment, we will call you to review the results.   Testing/Procedures: SEE CT INSTRUCTION LETTER    Follow-Up: At [facility], you and your health needs are our priority.  As part of our continuing mission to provide you with exceptional heart care, we have created designated Provider Care Teams.  These Care Teams include your primary Cardiologist (physician) and Advanced Practice Providers (APPs -  Physician Assistants and Nurse Practitioners) who all work together to provide you with the care you need, when you need it.  We recommend signing up for the patient portal called "MyChart".  Sign up information is provided on this After Visit Summary.  MyChart is used to connect with patients for Virtual Visits (Telemedicine).  Patients are able to view lab/test results, encounter notes, upcoming appointments, etc.  Non-urgent messages can be sent to your provider as well.   To learn more about what you can do with MyChart, go to [email or web address].    Your next appointment:   KEEP SCHEDULED APPOINTMENT   Important Information About Sugar

## 2022-08-27 NOTE — Therapy (Signed)
OUTPATIENT PHYSICAL THERAPY TREATMENT  Patient Name: Vernon [name] MRN: [id number] DOB:[birth year], 76 y.o., male Today's Date: 08/27/2022  PCP: Baxter Hire, MD  REFERRING PROVIDER: Baxter Hire, MD        END OF SESSION:   PT End of Session - 08/27/22 1533     Visit Number 27    Number of Visits 31    Date for PT Re-Evaluation 09/24/22    Authorization Type [employer] Medicare    Progress Note Due on Visit 19    PT Start Time 1535    PT Stop Time 1615    PT Time Calculation (min) 40 min    Equipment Utilized During Treatment Gait belt    Activity Tolerance Patient tolerated treatment well    Behavior During Therapy WFL for tasks assessed/performed                         Past Medical History:  Diagnosis Date   Anemia    Aortic stenosis    Arthritis    Complication of anesthesia    hard time getting bp up after knee replacement   Diabetes mellitus without complication (HCC)    GERD (gastroesophageal reflux disease)    occ tums prn   History of hiatal hernia    Hypertension    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[place])    Presence of Watchman left atrial appendage closure device 07/26/2022   19mm Watchman FLX with Dr. Quentin Ore   Past Surgical History:  Procedure Laterality Date   CARPAL TUNNEL RELEASE  2012   CRANIOTOMY N/A 02/10/2022   Procedure: SUBOCCIPITAL CRANIECTOMY FOR EVACUATION OF CEREBELLAR HEMATOMA;  Surgeon: Kristeen Miss, MD;  Location: [facility];  Service: Neurosurgery;  Laterality: N/A;   JOINT REPLACEMENT Right 2010   LEFT ATRIAL APPENDAGE OCCLUSION N/A 07/26/2022   Procedure: LEFT ATRIAL APPENDAGE OCCLUSION;  Surgeon: Vickie Epley, MD;  Location: [facility];  Service: Cardiovascular;  Laterality: N/A;   SHOULDER ARTHROSCOPY WITH ROTATOR CUFF REPAIR AND OPEN BICEPS TENODESIS Right 11/09/2019   Procedure: RIGHT SHOULDER ARTHROSCOPY WITH SUBSCAPULARIS REPAIR, SUBACROMIAL DECOMPRESSION,MINI OPEN ROTATOR CUFF REPAIR;  Surgeon: Leim Fabry, MD;  Location: [facility];  Service: Orthopedics;  Laterality: Right;   TEE WITHOUT CARDIOVERSION N/A 07/26/2022   Procedure: TRANSESOPHAGEAL ECHOCARDIOGRAM (TEE);  Surgeon: Vickie Epley, MD;  Location: [facility];  Service: Cardiovascular;  Laterality: N/A;   Patient Active Problem List   Diagnosis Date Noted   Atrial fibrillation ([place]) 07/26/2022   Presence of Watchman left atrial appendage closure device 07/26/2022   Proteinuria, unspecified 05/23/2022   Simple renal cyst 05/23/2022   Long term current use of anticoagulant 04/04/2022   Rotator cuff syndrome 04/04/2022   Exposure to potentially hazardous substance 04/04/2022   Gout 04/04/2022   Osteoarthritis 04/04/2022   Osteoporosis 04/04/2022   Other specified diseases of hair and hair follicles 90/38/3338   Pain in joint, lower leg 04/04/2022   Problem related to unspecified psychosocial circumstances 04/04/2022   General medical examination for administrative purposes 04/04/2022   Stage 3b chronic kidney disease ([place])    Dysphagia, post-stroke    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 hemorrhage of brain ([place]) 02/26/2022   Cough 32/91/9166   Acute metabolic encephalopathy 06/00/4599   Obstructive hydrocephalus ([place]) 02/19/2022   Dyslipidemia 02/19/2022   Dysphagia 02/19/2022   AKI (acute kidney injury) ([place])    Hyperkalemia    Anemia    Pleural effusion on right    Respiratory  failure requiring intubation (HCC)    Cerebral edema (HCC)    Chronic atrial fibrillation (HCC)    Controlled type 2 diabetes mellitus with hyperglycemia, without long-term current use of insulin (HCC)    ICH (intracerebral hemorrhage) ([place]) 02/10/2022   Intracranial hemorrhage (HCC)    Anticoagulated    Moderate tricuspid regurgitation 07/26/2021   Moderate aortic valve stenosis 03/08/2020   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[place]) 02/18/2020   Macrocytic anemia 01/13/2020   Spleen enlargement 01/13/2020   Bilateral carotid artery stenosis 07/14/2018    Atrial fibrillation, chronic ([place]) 07/14/2018   Type 2 diabetes with nephropathy ([place]) 02/14/2016   Essential hypertension 08/16/2014   Hyperlipemia, mixed 08/16/2014   Renal insufficiency 08/16/2014    REFERRING DIAG: I62.9 (ICD-10-CM) - Nontraumatic intracranial hemorrhage, unspecified   THERAPY DIAG:  Unsteadiness on feet  Muscle weakness (generalized)  Other abnormalities of gait and mobility  Rationale for Evaluation and Treatment Rehabilitation  PERTINENT HISTORY: see MAR  PRECAUTIONS: fall risk  SUBJECTIVE: Been moving around a lot today and have done a lot of walking.  Had a follow-up visit about the Watchman procedure.  Wife reports she notices a definite improvement in balance in past 1-2 weeks.  PAIN:  Are you having pain? NO:    OBJECTIVE:   Vitals with ex:  O2:  93%; HR 84 bpm Vitals post ex:  O2:  98%; HR 72 bpm  TODAY'S TREATMENT: 08/27/2022 Activity Comments  Gentle minisquat to upright posture ("squat and squeeze"), 10 reps   Wide BOS lateral weightshifting 10 reps, then added reach, then shift and lift, for improved dynamic SLS   Trunk rotation/reach x 10 reps each side Cues to follow hand with eyes  Side step and reach, 2 x 5 reps, stepping over obstacle, with head turn to look at outstretched hand   Forward/back walking in parallel bars Cues for posture  Sidestepping, 4 reps 4# weight LLE, light UE support  SLS with foot propped on step:  head turns/head nods  Min guard/min assist  Step taps to step with UE taps to opposite knee, x 10 reps 1 UE support and PT min assist  Multiple reps of short distance gait in parallel bars>chair and sit<>Stand Cues for slowed, deliberate pace  Cues for diaphragm breathing between sets of exercises    *Wearing 4# weight on LLE throughout standing exercises.    HOME EXERCISE PROGRAM Last updated: 08/06/22 Access Code: [email or web address] URL: [email or web address] Date: 08/06/2022 Prepared by: [facility]  Program Notes Perform standing exercises with supervision only!  Exercises - Seated Hip Abduction  - 1 x daily - 5 x weekly - 2 sets - 10 reps - Seated Long Arc Quad  - 1 x daily - 5 x weekly - 2 sets - 10 reps - Sit to Stand with Counter Support  - 1 x daily - 5 x weekly - 2 sets - 10 reps - Standing Hip Abduction with Counter Support  - 1 x daily - 5 x weekly - 2 sets - 10 reps - Standing Alternating Knee Flexion with Ankle Weights  - 1 x daily - 5 x weekly - 2 sets - 10 reps - Forward Backward Weight Shift with Counter Support  - 1 x daily - 5 x weekly - 2 sets - 10 reps   -------------------------------------------------------------------------------------------------------------------------   From eval  DIAGNOSTIC FINDINGS: see MAR   COGNITION: Overall cognitive status: Within functional limits for tasks assessed  SENSATION: WFL   COORDINATION: Bradykinesia, difficulty wit rapid alternating movements     MUSCLE TONE: WFL       POSTURE: No Significant postural limitations   LOWER EXTREMITY ROM:      WFL  (Blank rows = not tested)   LOWER EXTREMITY MMT:     MMT Right Eval Left Eval  Hip flexion 5 4  Hip extension 5 3+  Hip abduction 5 3-  Hip adduction      Hip internal rotation      Hip external rotation      Knee flexion 5 4  Knee extension 5 4  Ankle dorsiflexion 5 4+  Ankle plantarflexion      Ankle inversion      Ankle eversion      (Blank rows = not tested)   BED MOBILITY:  Independent   TRANSFERS: Assistive device utilized: Environmental consultant - 2 wheeled  Sit to stand: Modified independence Stand to sit: Modified independence Chair to chair: SBA and CGA Floor:  DNT   RAMP:  Not available   CURB:  Level of Assistance: SBA and CGA Assistive device utilized: Environmental consultant - 2 wheeled Curb Comments: undershoots with LLE   STAIRS:           Level of Assistance: SBA and CGA           Stair  Negotiation Technique: Alternating Pattern  with Bilateral Rails           Number of Stairs: 6             Height of Stairs: 4-6"            Comments: dysmetria w/ LLE   GAIT: Gait pattern: circumduction- Left and ataxic Distance walked: 100 Assistive device utilized: Walker - 2 wheeled Level of assistance: SBA Comments: dysmetria LLE   FUNCTIONAL TESTs:  Timed up and go (TUG): 20.25 with RW Berg Balance Scale: 35/56      M-CTSIB  Condition 1: Firm Surface, EO 30 Sec, Mild Sway  Condition 2: Firm Surface, EC 30 Sec, Mild and Moderate Sway  Condition 3: Foam Surface, EO 9 Sec, Severe Sway  Condition 4: Foam Surface, EC 0 Sec,  unable  Sway     07/02/22 M-CTSIB  Condition 1: Firm Surface, EO 30 Sec, Mild and Moderate Sway  Condition 2: Firm Surface, EC 30 Sec, Moderate Sway * required RW support to get into romberg  Condition 3: Foam Surface, EO 30 Sec, Severe Sway * required RW support to get into romberg  Condition 4: Foam Surface, EC NT Sec, NT Sway          PATIENT SURVEYS:  FOTO 54.43% functional status   TODAY'S TREATMENT:  Evaluation, sidestepping along counter, tandem stance at counter     PATIENT EDUCATION: Education details: scope of PT intervention Person educated: Patient and Spouse Education method: Explanation Education comprehension: verbalized understanding     HOME EXERCISE PROGRAM: sidestepping along counter, tandem stance at counter   -----------------------------------------------------------------------------------------------------------------------    GOALS: Goals reviewed with patient? Yes   SHORT TERM GOALS: Target date: 05/29/2022   Patient will perform HEP with family/caregiver supervision for improved strength, balance, transfers, and gait  Baseline: Goal status: MET   2.  Patient will achieve 15 seconds for TUG test to manifest reduced risk for falls Baseline: 20.25 with RW; 14 sec with RW Goal status: MET   3. Demo  modified independent gait level surfaces and curb negotiation  Baseline: CGA-SBA with RW; unchanged 07/02/22; SBA 08/13/22            Goal status: On-going 08/13/22   LONG TERM GOALS: Target date: 09/24/2022   Patient will demonstrate score 45/56 Berg Balance Test to manifest low risk for falls Baseline: 35/56; 37/56 on 06/06/22; 37/56 07/02/22; 40/56 08/13/22 Goal status: On-going   2.  Demonstrate improved static balance and postural control per time of 15 sec condition 4 M-CTSIB to improve safety with mobility on uneven surfaces Baseline: complete LOB left with condition 3; NT d/t safety 07/02/22; 8/21/203 15.66 sec, 10 sec Goal status: MET 08/13/22   3.  Demo modified independent gait using least restrictive AD over various surfaces and ascend/descend stairs with set-up assist to improve functional mobility Baseline: able to navigate stairs with B handrail with CGA 07/02/22; able with 1 handrail 08/13/22 Goal status: MET 08/13/22   4.  Demo score of 57% functional status FOTO  Baseline: 54.43%; 57% 07/02/22 Goal status: MET       ASSESSMENT:   CLINICAL IMPRESSION: Skilled PT session today focused on balance, coordination, control of movement patterns in standing positions.  Utilized large amplitude movement patterns in standing, to emphasize posture, weightshifting, trunk rotation, and step/balance recovery, incorporating UE motion and vision to follow UEs.  When he uses vision to follow UEs, he demonstrates improved control of movement patterns.  He needs cues to continue this throughout set of exercise; this does not appear to carryover to transitional movement patterns, as he continues to need cue for slowed, deliberate pace of movement patterns.  HE will continue to benefit from skilled PT towards goals for improved functional mobility, balance, and decreased fall risk.  OBJECTIVE IMPAIRMENTS Abnormal gait, decreased activity tolerance, decreased balance, decreased  coordination, decreased endurance, decreased knowledge of use of DME, difficulty walking, decreased strength, impaired perceived functional ability, and improper body mechanics.    ACTIVITY LIMITATIONS carrying, lifting, bending, standing, squatting, stairs, transfers, and locomotion level   PARTICIPATION LIMITATIONS: meal prep, cleaning, laundry, driving, shopping, community activity, and yard work   PERSONAL FACTORS Age, Fitness, and Time since onset of injury/illness/exacerbation are also affecting patient's functional outcome.    REHAB POTENTIAL: Excellent   CLINICAL DECISION MAKING: Evolving/moderate complexity   EVALUATION COMPLEXITY: Moderate   PLAN: PT FREQUENCY: 1x/week   PT DURATION: 6 weeks   PLANNED INTERVENTIONS: Therapeutic exercises, Therapeutic activity, Neuromuscular re-education, Balance training, Gait training, Patient/Family education, Joint mobilization, Stair training, Vestibular training, Canalith repositioning, DME instructions, Aquatic Therapy, Electrical stimulation, Wheelchair mobility training, and Manual therapy   PLAN FOR NEXT SESSION:  Continue exercises to address balance, stability, gait and standing exercises lessening UE support; standing large amplitude exercises (similar to PWR! Moves with UE support and incorporating vision/head movements) seemed to help control of movement today.   Mady Haagensen, PT 08/27/22 5:37 PM Phone: [phone] Fax: [phone]   [facility] at [facility] [place], [address] [place], [place] [address] Phone # [phone] Fax # [phone]

## 2022-09-03 ENCOUNTER — Ambulatory Visit: Payer: Medicare Other | Admitting: Physical Therapy

## 2022-09-03 ENCOUNTER — Encounter: Payer: Self-pay | Admitting: Physical Therapy

## 2022-09-03 DIAGNOSIS — R2681 Unsteadiness on feet: Secondary | ICD-10-CM | POA: Diagnosis not present

## 2022-09-03 DIAGNOSIS — M6281 Muscle weakness (generalized): Secondary | ICD-10-CM

## 2022-09-03 DIAGNOSIS — R2689 Other abnormalities of gait and mobility: Secondary | ICD-10-CM

## 2022-09-03 NOTE — Therapy (Signed)
OUTPATIENT PHYSICAL THERAPY TREATMENT  Patient Name: Vernon [name] MRN: [id number] DOB:12-21-1945, 76 y.o., male Today's Date: 09/03/2022  PCP: Baxter Hire, MD  REFERRING PROVIDER: Baxter Hire, MD        END OF SESSION:   PT End of Session - 09/03/22 1616     Visit Number 28    Number of Visits 31    Date for PT Re-Evaluation 09/24/22    Authorization Type [employer] Medicare    Progress Note Due on Visit 29    PT Start Time 1618    Equipment Utilized During Treatment Gait belt    Activity Tolerance Patient tolerated treatment well    Behavior During Therapy WFL for tasks assessed/performed                          Past Medical History:  Diagnosis Date   Anemia    Aortic stenosis    Arthritis    Complication of anesthesia    hard time getting bp up after knee replacement   Diabetes mellitus without complication (HCC)    GERD (gastroesophageal reflux disease)    occ tums prn   History of hiatal hernia    Hypertension    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[place])    Presence of Watchman left atrial appendage closure device 07/26/2022   60mm Watchman FLX with Dr. Quentin Ore   Past Surgical History:  Procedure Laterality Date   CARPAL TUNNEL RELEASE  2012   CRANIOTOMY N/A 02/10/2022   Procedure: SUBOCCIPITAL CRANIECTOMY FOR EVACUATION OF CEREBELLAR HEMATOMA;  Surgeon: Kristeen Miss, MD;  Location: [place];  Service: Neurosurgery;  Laterality: N/A;   JOINT REPLACEMENT Right 2010   LEFT ATRIAL APPENDAGE OCCLUSION N/A 07/26/2022   Procedure: LEFT ATRIAL APPENDAGE OCCLUSION;  Surgeon: Vickie Epley, MD;  Location: [facility];  Service: Cardiovascular;  Laterality: N/A;   SHOULDER ARTHROSCOPY WITH ROTATOR CUFF REPAIR AND OPEN BICEPS TENODESIS Right 11/09/2019   Procedure: RIGHT SHOULDER ARTHROSCOPY WITH SUBSCAPULARIS REPAIR, SUBACROMIAL DECOMPRESSION,MINI OPEN ROTATOR CUFF REPAIR;  Surgeon: Leim Fabry, MD;  Location: [facility];  Service: Orthopedics;   Laterality: Right;   TEE WITHOUT CARDIOVERSION N/A 07/26/2022   Procedure: TRANSESOPHAGEAL ECHOCARDIOGRAM (TEE);  Surgeon: Vickie Epley, MD;  Location: [facility];  Service: Cardiovascular;  Laterality: N/A;   Patient Active Problem List   Diagnosis Date Noted   Atrial fibrillation ([place]) 07/26/2022   Presence of Watchman left atrial appendage closure device 07/26/2022   Proteinuria, unspecified 05/23/2022   Simple renal cyst 05/23/2022   Long term current use of anticoagulant 04/04/2022   Rotator cuff syndrome 04/04/2022   Exposure to potentially hazardous substance 04/04/2022   Gout 04/04/2022   Osteoarthritis 04/04/2022   Osteoporosis 04/04/2022   Other specified diseases of hair and hair follicles 62/94/7654   Pain in joint, lower leg 04/04/2022   Problem related to unspecified psychosocial circumstances 04/04/2022   General medical examination for administrative purposes 04/04/2022   Stage 3b chronic kidney disease ([place])    Dysphagia, post-stroke    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 hemorrhage of brain ([place]) 02/26/2022   Cough 65/01/5464   Acute metabolic encephalopathy 68/10/7516   Obstructive hydrocephalus ([place]) 02/19/2022   Dyslipidemia 02/19/2022   Dysphagia 02/19/2022   AKI (acute kidney injury) ([place])    Hyperkalemia    Anemia    Pleural effusion on right    Respiratory failure requiring intubation (HCC)    Cerebral edema (HCC)    Chronic atrial  fibrillation ([place])    Controlled type 2 diabetes mellitus with hyperglycemia, without long-term current use of insulin (HCC)    ICH (intracerebral hemorrhage) ([place]) 02/10/2022   Intracranial hemorrhage (HCC)    Anticoagulated    Moderate tricuspid regurgitation 07/26/2021   Moderate aortic valve stenosis 03/08/2020   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[place]) 02/18/2020   Macrocytic anemia 01/13/2020   Spleen enlargement 01/13/2020   Bilateral carotid artery stenosis 07/14/2018   Atrial fibrillation, chronic ([place]) 07/14/2018    Type 2 diabetes with nephropathy ([place]) 02/14/2016   Essential hypertension 08/16/2014   Hyperlipemia, mixed 08/16/2014   Renal insufficiency 08/16/2014    REFERRING DIAG: I62.9 (ICD-10-CM) - Nontraumatic intracranial hemorrhage, unspecified   THERAPY DIAG:  Unsteadiness on feet  Muscle weakness (generalized)  Other abnormalities of gait and mobility  Rationale for Evaluation and Treatment Rehabilitation  PERTINENT HISTORY: see MAR  PRECAUTIONS: fall risk  SUBJECTIVE: Nothing new since last visit. PAIN:  Are you having pain? NO:    OBJECTIVE:   Vitals with ex:  O2:  94%; HR 68 bpm Vitals post ex:  O2:  95%; HR 87 bpm     TODAY'S TREATMENT: 09/03/2022 Activity Comments  Gentle minisquat to upright posture ("squat and squeeze"), 10 reps   Wide BOS lateral weightshifting 10 reps, then added reach with rocking, for improved dynamic SLS   Trunk rotation/reach x 10 reps each side Cues to follow hand with eyes  Side step and reach, 2 x 5 reps,  with head turn to look at outstretched hand   Forward step over obstacle (step/step forward, then back), in parallel bars Cues for posture  Sidestep/together over obstacle 2 x10 reps 4# weight LLE, light UE support        Short distance gait using RW with supervision Cues for slowed, deliberate pace  Cues for diaphragm breathing between sets of exercises    *Wearing 4# weight on LLE throughout standing exercises.    HOME EXERCISE PROGRAM Last updated: 08/06/22 Access Code: [email or web address] URL: [email or web address] Date: 08/06/2022 Prepared by: [facility]  Program Notes Perform standing exercises with supervision only!  Exercises - Seated Hip Abduction  - 1 x daily - 5 x weekly - 2 sets - 10 reps - Seated Long Arc Quad  - 1 x daily - 5 x weekly - 2 sets - 10 reps - Sit to Stand with Counter Support  - 1 x daily - 5 x weekly - 2 sets - 10 reps - Standing Hip Abduction with  Counter Support  - 1 x daily - 5 x weekly - 2 sets - 10 reps - Standing Alternating Knee Flexion with Ankle Weights  - 1 x daily - 5 x weekly - 2 sets - 10 reps - Forward Backward Weight Shift with Counter Support  - 1 x daily - 5 x weekly - 2 sets - 10 reps   -------------------------------------------------------------------------------------------------------------------------   From eval  DIAGNOSTIC FINDINGS: see MAR   COGNITION: Overall cognitive status: Within functional limits for tasks assessed             SENSATION: WFL   COORDINATION: Bradykinesia, difficulty wit rapid alternating movements     MUSCLE TONE: WFL       POSTURE: No Significant postural limitations   LOWER EXTREMITY ROM:      WFL  (Blank rows = not tested)   LOWER EXTREMITY MMT:     MMT Right Eval Left Eval  Hip flexion 5 4  Hip extension 5 3+  Hip abduction 5 3-  Hip adduction      Hip internal rotation      Hip external rotation      Knee flexion 5 4  Knee extension 5 4  Ankle dorsiflexion 5 4+  Ankle plantarflexion      Ankle inversion      Ankle eversion      (Blank rows = not tested)   BED MOBILITY:  Independent   TRANSFERS: Assistive device utilized: Environmental consultant - 2 wheeled  Sit to stand: Modified independence Stand to sit: Modified independence Chair to chair: SBA and CGA Floor:  DNT   RAMP:  Not available   CURB:  Level of Assistance: SBA and CGA Assistive device utilized: Environmental consultant - 2 wheeled Curb Comments: undershoots with LLE   STAIRS:           Level of Assistance: SBA and CGA           Stair Negotiation Technique: Alternating Pattern  with Bilateral Rails           Number of Stairs: 6             Height of Stairs: 4-6"            Comments: dysmetria w/ LLE   GAIT: Gait pattern: circumduction- Left and ataxic Distance walked: 100 Assistive device utilized: Walker - 2 wheeled Level of assistance: SBA Comments: dysmetria LLE   FUNCTIONAL TESTs:  Timed up and  go (TUG): 20.25 with RW Berg Balance Scale: 35/56      M-CTSIB  Condition 1: Firm Surface, EO 30 Sec, Mild Sway  Condition 2: Firm Surface, EC 30 Sec, Mild and Moderate Sway  Condition 3: Foam Surface, EO 9 Sec, Severe Sway  Condition 4: Foam Surface, EC 0 Sec,  unable  Sway     07/02/22 M-CTSIB  Condition 1: Firm Surface, EO 30 Sec, Mild and Moderate Sway  Condition 2: Firm Surface, EC 30 Sec, Moderate Sway * required RW support to get into romberg  Condition 3: Foam Surface, EO 30 Sec, Severe Sway * required RW support to get into romberg  Condition 4: Foam Surface, EC NT Sec, NT Sway          PATIENT SURVEYS:  FOTO 54.43% functional status   TODAY'S TREATMENT:  Evaluation, sidestepping along counter, tandem stance at counter     PATIENT EDUCATION: Education details: scope of PT intervention Person educated: Patient and Spouse Education method: Explanation Education comprehension: verbalized understanding     HOME EXERCISE PROGRAM: sidestepping along counter, tandem stance at counter   -----------------------------------------------------------------------------------------------------------------------    GOALS: Goals reviewed with patient? Yes   SHORT TERM GOALS: Target date: 05/29/2022   Patient will perform HEP with family/caregiver supervision for improved strength, balance, transfers, and gait  Baseline: Goal status: MET   2.  Patient will achieve 15 seconds for TUG test to manifest reduced risk for falls Baseline: 20.25 with RW; 14 sec with RW Goal status: MET   3. Demo modified independent gait level surfaces and curb negotiation            Baseline: CGA-SBA with RW; unchanged 07/02/22; SBA 08/13/22            Goal status: On-going 08/13/22   LONG TERM GOALS: Target date: 09/24/2022   Patient will demonstrate score 45/56 Berg Balance Test to manifest low risk for falls Baseline: 35/56; 37/56 on 06/06/22; 37/56 07/02/22; 40/56 08/13/22 Goal  status: On-going  2.  Demonstrate improved static balance and postural control per time of 15 sec condition 4 M-CTSIB to improve safety with mobility on uneven surfaces Baseline: complete LOB left with condition 3; NT d/t safety 07/02/22; 8/21/203 15.66 sec, 10 sec Goal status: MET 08/13/22   3.  Demo modified independent gait using least restrictive AD over various surfaces and ascend/descend stairs with set-up assist to improve functional mobility Baseline: able to navigate stairs with B handrail with CGA 07/02/22; able with 1 handrail 08/13/22 Goal status: MET 08/13/22   4.  Demo score of 57% functional status FOTO  Baseline: 54.43%; 57% 07/02/22 Goal status: MET       ASSESSMENT:   CLINICAL IMPRESSION: ***Skilled PT session today focused on balance, coordination, control of movement patterns in standing positions.  Utilized large amplitude movement patterns in standing, to emphasize posture, weightshifting, trunk rotation, and step/balance recovery, incorporating UE motion and vision to follow UEs.  When he uses vision to follow UEs, he demonstrates improved control of movement patterns.  He needs cues to continue this throughout set of exercise; this does not appear to carryover to transitional movement patterns, as he continues to need cue for slowed, deliberate pace of movement patterns.  HE will continue to benefit from skilled PT towards goals for improved functional mobility, balance, and decreased fall risk.  OBJECTIVE IMPAIRMENTS Abnormal gait, decreased activity tolerance, decreased balance, decreased coordination, decreased endurance, decreased knowledge of use of DME, difficulty walking, decreased strength, impaired perceived functional ability, and improper body mechanics.    ACTIVITY LIMITATIONS carrying, lifting, bending, standing, squatting, stairs, transfers, and locomotion level   PARTICIPATION LIMITATIONS: meal prep, cleaning, laundry, driving, shopping, community  activity, and yard work   PERSONAL FACTORS Age, Fitness, and Time since onset of injury/illness/exacerbation are also affecting patient's functional outcome.    REHAB POTENTIAL: Excellent   CLINICAL DECISION MAKING: Evolving/moderate complexity   EVALUATION COMPLEXITY: Moderate   PLAN: PT FREQUENCY: 1x/week   PT DURATION: 6 weeks   PLANNED INTERVENTIONS: Therapeutic exercises, Therapeutic activity, Neuromuscular re-education, Balance training, Gait training, Patient/Family education, Joint mobilization, Stair training, Vestibular training, Canalith repositioning, DME instructions, Aquatic Therapy, Electrical stimulation, Wheelchair mobility training, and Manual therapy   PLAN FOR NEXT SESSION:  ***Continue exercises to address balance, stability, gait and standing exercises lessening UE support; standing large amplitude exercises (similar to PWR! Moves with UE support and incorporating vision/head movements) seemed to help control of movement today.   Mady Haagensen, PT 09/03/22 4:17 PM Phone: [phone] Fax: [phone]   [facility] [address] [place], [place] [address] Phone # [phone] Fax # [phone]

## 2022-09-05 ENCOUNTER — Inpatient Hospital Stay: Payer: Medicare Other | Attending: Internal Medicine

## 2022-09-05 ENCOUNTER — Inpatient Hospital Stay: Payer: Medicare Other

## 2022-09-05 VITALS — BP 145/62 | HR 52 | Resp 18

## 2022-09-05 DIAGNOSIS — D461 Refractory anemia with ring sideroblasts: Secondary | ICD-10-CM | POA: Diagnosis present

## 2022-09-05 DIAGNOSIS — D469 Myelodysplastic syndrome, unspecified: Secondary | ICD-10-CM

## 2022-09-05 DIAGNOSIS — N1832 Chronic kidney disease, stage 3b: Secondary | ICD-10-CM | POA: Diagnosis not present

## 2022-09-05 LAB — HEMOGLOBIN AND HEMATOCRIT, BLOOD
HCT: 28.8 % — ABNORMAL LOW (ref 39.0–52.0)
Hemoglobin: 9.4 g/dL — ABNORMAL LOW (ref 13.0–17.0)

## 2022-09-05 MED ORDER — DARBEPOETIN ALFA 100 MCG/0.5ML IJ SOSY
100.0000 ug | PREFILLED_SYRINGE | Freq: Once | INTRAMUSCULAR | Status: AC
  Administered 2022-09-05: 100 ug via SUBCUTANEOUS
  Filled 2022-09-05: qty 0.5

## 2022-09-10 ENCOUNTER — Ambulatory Visit: Payer: Medicare Other

## 2022-09-10 DIAGNOSIS — R2689 Other abnormalities of gait and mobility: Secondary | ICD-10-CM

## 2022-09-10 DIAGNOSIS — I69254 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: Secondary | ICD-10-CM

## 2022-09-10 DIAGNOSIS — R262 Difficulty in walking, not elsewhere classified: Secondary | ICD-10-CM

## 2022-09-10 DIAGNOSIS — M6281 Muscle weakness (generalized): Secondary | ICD-10-CM

## 2022-09-10 DIAGNOSIS — R2681 Unsteadiness on feet: Secondary | ICD-10-CM

## 2022-09-10 NOTE — Therapy (Signed)
OUTPATIENT PHYSICAL THERAPY TREATMENT and Progress Note  Patient Name: Vernon [name] MRN: [id number] DOB:Aug 29, 1946, 76 y.o., male Today's Date: 09/10/2022  PCP: Baxter Hire, MD  REFERRING PROVIDER: Baxter Hire, MD   Progress Note Reporting Period 07/19/22 to 09/10/22  See note below for Objective Data and Assessment of Progress/Goals.    END OF SESSION:   PT End of Session - 09/10/22 1618     Visit Number 29    Number of Visits 31    Date for PT Re-Evaluation 09/24/22    Authorization Type [employer] Medicare    Progress Note Due on Visit 63    PT Start Time 1615    PT Stop Time 1700    PT Time Calculation (min) 45 min    Equipment Utilized During Treatment Gait belt    Activity Tolerance Patient tolerated treatment well    Behavior During Therapy WFL for tasks assessed/performed                          Past Medical History:  Diagnosis Date   Anemia    Aortic stenosis    Arthritis    Complication of anesthesia    hard time getting bp up after knee replacement   Diabetes mellitus without complication (HCC)    GERD (gastroesophageal reflux disease)    occ tums prn   History of hiatal hernia    Hypertension    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[place])    Presence of Watchman left atrial appendage closure device 07/26/2022   32mm Watchman FLX with Dr. Quentin Ore   Past Surgical History:  Procedure Laterality Date   CARPAL TUNNEL RELEASE  2012   CRANIOTOMY N/A 02/10/2022   Procedure: SUBOCCIPITAL CRANIECTOMY FOR EVACUATION OF CEREBELLAR HEMATOMA;  Surgeon: Kristeen Miss, MD;  Location: [facility];  Service: Neurosurgery;  Laterality: N/A;   JOINT REPLACEMENT Right 2010   LEFT ATRIAL APPENDAGE OCCLUSION N/A 07/26/2022   Procedure: LEFT ATRIAL APPENDAGE OCCLUSION;  Surgeon: Vickie Epley, MD;  Location: [facility];  Service: Cardiovascular;  Laterality: N/A;   SHOULDER ARTHROSCOPY WITH ROTATOR CUFF REPAIR AND OPEN BICEPS TENODESIS Right 11/09/2019    Procedure: RIGHT SHOULDER ARTHROSCOPY WITH SUBSCAPULARIS REPAIR, SUBACROMIAL DECOMPRESSION,MINI OPEN ROTATOR CUFF REPAIR;  Surgeon: Leim Fabry, MD;  Location: [facility];  Service: Orthopedics;  Laterality: Right;   TEE WITHOUT CARDIOVERSION N/A 07/26/2022   Procedure: TRANSESOPHAGEAL ECHOCARDIOGRAM (TEE);  Surgeon: Vickie Epley, MD;  Location: [facility];  Service: Cardiovascular;  Laterality: N/A;   Patient Active Problem List   Diagnosis Date Noted   Atrial fibrillation ([place]) 07/26/2022   Presence of Watchman left atrial appendage closure device 07/26/2022   Proteinuria, unspecified 05/23/2022   Simple renal cyst 05/23/2022   Long term current use of anticoagulant 04/04/2022   Rotator cuff syndrome 04/04/2022   Exposure to potentially hazardous substance 04/04/2022   Gout 04/04/2022   Osteoarthritis 04/04/2022   Osteoporosis 04/04/2022   Other specified diseases of hair and hair follicles 10/93/2355   Pain in joint, lower leg 04/04/2022   Problem related to unspecified psychosocial circumstances 04/04/2022   General medical examination for administrative purposes 04/04/2022   Stage 3b chronic kidney disease ([place])    Dysphagia, post-stroke    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 hemorrhage of brain ([place]) 02/26/2022   Cough 73/22/0254   Acute metabolic encephalopathy 27/04/2375   Obstructive hydrocephalus ([place]) 02/19/2022   Dyslipidemia 02/19/2022   Dysphagia 02/19/2022   AKI (acute kidney injury) (  HCC)    Hyperkalemia    Anemia    Pleural effusion on right    Respiratory failure requiring intubation (HCC)    Cerebral edema (HCC)    Chronic atrial fibrillation ([place])    Controlled type 2 diabetes mellitus with hyperglycemia, without long-term current use of insulin (HCC)    ICH (intracerebral hemorrhage) ([place]) 02/10/2022   Intracranial hemorrhage (HCC)    Anticoagulated    Moderate tricuspid regurgitation 07/26/2021   Moderate aortic valve stenosis 03/08/2020   MDS  (myelodysplastic syndrome) ([place]) 02/18/2020   Macrocytic anemia 01/13/2020   Spleen enlargement 01/13/2020   Bilateral carotid artery stenosis 07/14/2018   Atrial fibrillation, chronic ([place]) 07/14/2018   Type 2 diabetes with nephropathy ([place]) 02/14/2016   Essential hypertension 08/16/2014   Hyperlipemia, mixed 08/16/2014   Renal insufficiency 08/16/2014    REFERRING DIAG: I62.9 (ICD-10-CM) - Nontraumatic intracranial hemorrhage, unspecified   THERAPY DIAG:  Unsteadiness on feet  Muscle weakness (generalized)  Other abnormalities of gait and mobility  Difficulty in walking, not elsewhere classified  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 PAIN:  Are you having pain? NO:    OBJECTIVE:    TODAY'S TREATMENT: 09/10/22 Activity Comments  Berg Balance Test 44/56  Wide BOS lateral weightshifting 10 reps, then added reach with rocking, for improved dynamic SLS   Trunk rotation/reach x 10 reps each side Cues in controlled fluid UE movement and adapting foot position to mainatain BOS  Trunk twist and crossing midline x 10   Stair ambulation BHR and reciprocal, modified indep  Gait training Modified indep level surfaces w/ RW, curb negotiation w/ supervision  Sit-stand eyes closed 2x5, improved control with subsequent reps        TODAY'S TREATMENT: 09/03/2022 Activity Comments  Gentle minisquat to upright posture ("squat and squeeze"), 10 reps   Wide BOS lateral weightshifting 10 reps, then added reach with rocking, for improved dynamic SLS   Trunk rotation/reach x 10 reps each side Cues to follow hand with eyes  Side step and reach, 2 x 5 reps,  with head turn to look at outstretched hand   Forward step over obstacle (step/step forward, then  back), in parallel bars Cues for posture  Sidestep/together over obstacle 2 x10 reps 4# weight LLE, light UE support        Short distance gait using RW with supervision Cues for slowed, deliberate pace  Cues for diaphragm breathing between sets of exercises    *Wearing 4# weight on LLE throughout standing exercises. Min guard/supervision provided throughout standing exercises Additional set of standing PWR! Moves (pt aware these are Parkinson's based exercises, but for him, they are for CONTROL of whole body movement patterns for improved balance)-performed at counter; then performed abbreviated set with wife present for education   PATIENT EDUCATION: Education details: *See instructions for 09/04/2022 additions to HEP; importance for wife to provide supervision/min guard and cues for control, pacing of exercises; educated to stop performing if they don't feel comfortable doing this at home. Person educated: Patient and Spouse Education method: Explanation, Demonstration, Verbal cues, and Handouts Education comprehension: verbalized understanding, returned demonstration, verbal cues required, and needs further education   HOME EXERCISE PROGRAM Last updated: 08/06/22; *See instructions for 09/04/2022 additions to HEP (Standing PWR! Moves exercises at counter with wife's supervision/guarding-2 x 5 reps, 3 times/week)  Access Code: [email or web address] URL: [email or web address] Date: 08/06/2022 Prepared by: [facility]  Program Notes Perform standing exercises with supervision only!  Exercises - Seated Hip Abduction  - 1 x daily - 5 x weekly - 2 sets - 10 reps - Seated Long Arc Quad  - 1 x daily - 5 x weekly - 2 sets - 10 reps - Sit to Stand with Counter Support  - 1 x daily - 5 x weekly - 2 sets - 10 reps - Standing Hip Abduction with Counter Support  - 1 x daily - 5 x weekly - 2 sets - 10 reps - Standing Alternating Knee Flexion with Ankle  Weights  - 1 x daily - 5 x weekly - 2 sets - 10 reps - Forward Backward Weight Shift with Counter Support  - 1 x daily - 5 x weekly - 2 sets - 10 reps   -------------------------------------------------------------------------------------------------------------------------   From eval  DIAGNOSTIC FINDINGS: see MAR   COGNITION: Overall cognitive status: Within functional limits for tasks assessed             SENSATION: WFL   COORDINATION: Bradykinesia, difficulty wit rapid alternating movements     MUSCLE TONE: WFL       POSTURE: No Significant postural limitations   LOWER EXTREMITY ROM:      WFL  (Blank rows = not tested)   LOWER EXTREMITY MMT:     MMT Right Eval Left Eval  Hip flexion 5 4  Hip extension 5 3+  Hip abduction 5 3-  Hip adduction      Hip internal rotation      Hip external rotation      Knee flexion 5 4  Knee extension 5 4  Ankle dorsiflexion 5 4+  Ankle plantarflexion      Ankle inversion      Ankle eversion      (Blank rows = not tested)   BED MOBILITY:  Independent   TRANSFERS: Assistive device utilized: Environmental consultant - 2 wheeled  Sit to stand: Modified independence Stand to sit: Modified independence Chair to chair: SBA and CGA Floor:  DNT   RAMP:  Not available   CURB:  Level of Assistance: SBA and CGA Assistive device utilized: Environmental consultant - 2 wheeled Curb Comments: undershoots with LLE   STAIRS:           Level of Assistance: SBA and CGA           Stair Negotiation Technique: Alternating Pattern  with Bilateral Rails           Number of Stairs: 6             Height of Stairs: 4-6"            Comments: dysmetria w/ LLE   GAIT: Gait pattern: circumduction- Left and ataxic Distance walked: 100 Assistive device utilized: Walker - 2 wheeled Level of assistance: SBA Comments: dysmetria LLE   FUNCTIONAL TESTs:  Timed up and go (TUG): 20.25 with RW Berg Balance Scale: 35/56      M-CTSIB  Condition 1: Firm Surface, EO 30 Sec,  Mild Sway  Condition 2: Firm Surface, EC 30 Sec, Mild and Moderate Sway  Condition 3: Foam Surface, EO 9 Sec, Severe Sway  Condition 4: Foam Surface, EC 0 Sec,  unable  Sway     07/02/22 M-CTSIB  Condition 1: Firm Surface, EO 30 Sec, Mild and Moderate Sway  Condition 2: Firm Surface, EC  30 Sec, Moderate Sway * required RW support to get into romberg  Condition 3: Foam Surface, EO 30 Sec, Severe Sway * required RW support to get into romberg  Condition 4: Foam Surface, EC NT Sec, NT Sway          PATIENT SURVEYS:  FOTO 54.43% functional status   TODAY'S TREATMENT:  Evaluation, sidestepping along counter, tandem stance at counter     PATIENT EDUCATION: Education details: scope of PT intervention Person educated: Patient and Spouse Education method: Explanation Education comprehension: verbalized understanding     HOME EXERCISE PROGRAM: sidestepping along counter, tandem stance at counter   -----------------------------------------------------------------------------------------------------------------------    GOALS: Goals reviewed with patient? Yes   SHORT TERM GOALS: Target date: 05/29/2022   Patient will perform HEP with family/caregiver supervision for improved strength, balance, transfers, and gait  Baseline: Goal status: MET   2.  Patient will achieve 15 seconds for TUG test to manifest reduced risk for falls Baseline: 20.25 with RW; 14 sec with RW Goal status: MET   3. Demo modified independent gait level surfaces and curb negotiation            Baseline: CGA-SBA with RW; unchanged 07/02/22; SBA 08/13/22; modified indep level surfaces w/ RW, supervision for curb            Goal status: On-going (09/10/22)   LONG TERM GOALS: Target date: 09/24/2022   Patient will demonstrate score 45/56 Berg Balance Test to manifest low risk for falls Baseline: 35/56; 37/56 on 06/06/22; 37/56 07/02/22; 40/56 08/13/22; (09/10/22) 44/56 Goal status: On-going   2.   Demonstrate improved static balance and postural control per time of 15 sec condition 4 M-CTSIB to improve safety with mobility on uneven surfaces Baseline: complete LOB left with condition 3; NT d/t safety 07/02/22; 8/21/203 15.66 sec, 10 sec Goal status: MET 08/13/22   3.  Demo modified independent gait using least restrictive AD over various surfaces and ascend/descend stairs with set-up assist to improve functional mobility Baseline: able to navigate stairs with B handrail with CGA 07/02/22; able with 1 handrail 08/13/22 Goal status: MET 08/13/22   4.  Demo score of 57% functional status FOTO  Baseline: 54.43%; 57% 07/02/22 Goal status: MET       ASSESSMENT:   CLINICAL IMPRESSION: Continued with activities to facilitate crossing of midline and maintaining fluid, consistent control of LUE and LLE movements with emphasis on rotation and eye movements to track LUE with improved coordination manifest throughout session.  Able to demo improved balance per 44/56 Berg Balance Test improved from previous 40/56 and demonstrates modified independent gait on level surfaces using RW from previous SBA designation. Attempted curb negotiation without prompt but ultimately needed verbal cue in safety/sequence. Continued sessions to meet remaining LTG  OBJECTIVE IMPAIRMENTS Abnormal gait, decreased activity tolerance, decreased balance, decreased coordination, decreased endurance, decreased knowledge of use of DME, difficulty walking, decreased strength, impaired perceived functional ability, and improper body mechanics.    ACTIVITY LIMITATIONS carrying, lifting, bending, standing, squatting, stairs, transfers, and locomotion level   PARTICIPATION LIMITATIONS: meal prep, cleaning, laundry, driving, shopping, community activity, and yard work   PERSONAL FACTORS Age, Fitness, and Time since onset of injury/illness/exacerbation are also affecting patient's functional outcome.    REHAB POTENTIAL:  Excellent   CLINICAL DECISION MAKING: Evolving/moderate complexity   EVALUATION COMPLEXITY: Moderate   PLAN: PT FREQUENCY: 1x/week   PT DURATION: 6 weeks   PLANNED INTERVENTIONS: Therapeutic exercises, Therapeutic activity, Neuromuscular re-education, Balance training, Gait training, Patient/Family  education, Engineer, manufacturing, IT trainer, Vestibular training, Canalith repositioning, DME instructions, Aquatic Therapy, Electrical stimulation, Wheelchair mobility training, and Manual therapy   PLAN FOR NEXT SESSION:  Doing well with modified PWR move exercises. POC ends 09/24/22  5:13 PM, 09/10/22 M. Sherlyn Lees, PT, DPT Physical Therapist- [place] Office Number: [phone]    [place] [facility] [address], [place], [place] [address] Phone # [phone] Fax # [phone]

## 2022-09-17 ENCOUNTER — Ambulatory Visit: Payer: Medicare Other

## 2022-09-17 DIAGNOSIS — M6281 Muscle weakness (generalized): Secondary | ICD-10-CM

## 2022-09-17 DIAGNOSIS — R2681 Unsteadiness on feet: Secondary | ICD-10-CM | POA: Diagnosis not present

## 2022-09-17 DIAGNOSIS — R262 Difficulty in walking, not elsewhere classified: Secondary | ICD-10-CM

## 2022-09-17 DIAGNOSIS — R2689 Other abnormalities of gait and mobility: Secondary | ICD-10-CM

## 2022-09-17 NOTE — Therapy (Signed)
OUTPATIENT PHYSICAL THERAPY TREATMENT  Patient Name: Vernon [name] MRN: [id number] DOB:[birth year], 76 y.o., male Today's Date: 09/17/2022  PCP: Baxter Hire, MD  REFERRING PROVIDER: Baxter Hire, MD     END OF SESSION:   PT End of Session - 09/17/22 1620     Visit Number 30    Number of Visits 31    Date for PT Re-Evaluation 09/24/22    Authorization Type [employer] Medicare    Progress Note Due on Visit 37    PT Start Time 1615    PT Stop Time 1700    PT Time Calculation (min) 45 min    Equipment Utilized During Treatment Gait belt    Activity Tolerance Patient tolerated treatment well    Behavior During Therapy WFL for tasks assessed/performed                          Past Medical History:  Diagnosis Date   Anemia    Aortic stenosis    Arthritis    Complication of anesthesia    hard time getting bp up after knee replacement   Diabetes mellitus without complication (HCC)    GERD (gastroesophageal reflux disease)    occ tums prn   History of hiatal hernia    Hypertension    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[place])    Presence of Watchman left atrial appendage closure device 07/26/2022   5mm Watchman FLX with Dr. Quentin Ore   Past Surgical History:  Procedure Laterality Date   CARPAL TUNNEL RELEASE  2012   CRANIOTOMY N/A 02/10/2022   Procedure: SUBOCCIPITAL CRANIECTOMY FOR EVACUATION OF CEREBELLAR HEMATOMA;  Surgeon: Kristeen Miss, MD;  Location: [facility];  Service: Neurosurgery;  Laterality: N/A;   JOINT REPLACEMENT Right 2010   LEFT ATRIAL APPENDAGE OCCLUSION N/A 07/26/2022   Procedure: LEFT ATRIAL APPENDAGE OCCLUSION;  Surgeon: Vickie Epley, MD;  Location: [facility];  Service: Cardiovascular;  Laterality: N/A;   SHOULDER ARTHROSCOPY WITH ROTATOR CUFF REPAIR AND OPEN BICEPS TENODESIS Right 11/09/2019   Procedure: RIGHT SHOULDER ARTHROSCOPY WITH SUBSCAPULARIS REPAIR, SUBACROMIAL DECOMPRESSION,MINI OPEN ROTATOR CUFF REPAIR;  Surgeon: Leim Fabry, MD;  Location: [facility];  Service: Orthopedics;  Laterality: Right;   TEE WITHOUT CARDIOVERSION N/A 07/26/2022   Procedure: TRANSESOPHAGEAL ECHOCARDIOGRAM (TEE);  Surgeon: Vickie Epley, MD;  Location: [facility];  Service: Cardiovascular;  Laterality: N/A;   Patient Active Problem List   Diagnosis Date Noted   Atrial fibrillation ([place]) 07/26/2022   Presence of Watchman left atrial appendage closure device 07/26/2022   Proteinuria, unspecified 05/23/2022   Simple renal cyst 05/23/2022   Long term current use of anticoagulant 04/04/2022   Rotator cuff syndrome 04/04/2022   Exposure to potentially hazardous substance 04/04/2022   Gout 04/04/2022   Osteoarthritis 04/04/2022   Osteoporosis 04/04/2022   Other specified diseases of hair and hair follicles 14/97/0263   Pain in joint, lower leg 04/04/2022   Problem related to unspecified psychosocial circumstances 04/04/2022   General medical examination for administrative purposes 04/04/2022   Stage 3b chronic kidney disease ([place])    Dysphagia, post-stroke    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 hemorrhage of brain ([place]) 02/26/2022   Cough 78/58/8502   Acute metabolic encephalopathy 77/41/2878   Obstructive hydrocephalus ([place]) 02/19/2022   Dyslipidemia 02/19/2022   Dysphagia 02/19/2022   AKI (acute kidney injury) ([place])    Hyperkalemia    Anemia    Pleural effusion on right    Respiratory failure requiring  intubation (HCC)    Cerebral edema (HCC)    Chronic atrial fibrillation ([place])    Controlled type 2 diabetes mellitus with hyperglycemia, without long-term current use of insulin (HCC)    ICH (intracerebral hemorrhage) ([place]) 02/10/2022   Intracranial hemorrhage (HCC)    Anticoagulated    Moderate tricuspid regurgitation 07/26/2021   Moderate aortic valve stenosis 03/08/2020   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[place]) 02/18/2020   Macrocytic anemia 01/13/2020   Spleen enlargement 01/13/2020   Bilateral carotid artery stenosis 07/14/2018    Atrial fibrillation, chronic ([place]) 07/14/2018   Type 2 diabetes with nephropathy ([place]) 02/14/2016   Essential hypertension 08/16/2014   Hyperlipemia, mixed 08/16/2014   Renal insufficiency 08/16/2014    REFERRING DIAG: I62.9 (ICD-10-CM) - Nontraumatic intracranial hemorrhage, unspecified   THERAPY DIAG:  Unsteadiness on feet  Muscle weakness (generalized)  Other abnormalities of gait and mobility  Difficulty in walking, not elsewhere classified  Rationale for Evaluation and Treatment Rehabilitation  PERTINENT HISTORY: see MAR  PRECAUTIONS: fall risk  SUBJECTIVE: Nothing new since last visit. PAIN:  Are you having pain? NO:    OBJECTIVE:   TODAY'S TREATMENT: 09/17/22 Activity Comments  NU-step level 5 x 5 min Dynamic warm-up  Modified PWR moves standing Shift, twist 2x10  Alt stair taps 3x10 For coordination and SLS  Standing with foot on step for SLS EO/EC 3x10 sec for SLS and proprioception  Resisted retro-forward walk x 10 reps 15-20#, cues for trunk extension to improve distal control by imposing proximal resistance        TODAY'S TREATMENT: 09/10/22 Activity Comments  Berg Balance Test 44/56  Wide BOS lateral weightshifting 10 reps, then added reach with rocking, for improved dynamic SLS   Trunk rotation/reach x 10 reps each side Cues in controlled fluid UE movement and adapting foot position to mainatain BOS  Trunk twist and crossing midline x 10   Stair ambulation BHR and reciprocal, modified indep  Gait training Modified indep level surfaces w/ RW, curb negotiation w/ supervision  Sit-stand eyes closed 2x5, improved control with subsequent reps        PATIENT EDUCATION: Education details: *See instructions for 09/04/2022 additions to HEP; importance for wife to provide supervision/min guard and cues for control, pacing of exercises; educated to stop performing if they don't feel comfortable doing this at home. Person educated: Patient and  Spouse Education method: Explanation, Demonstration, Verbal cues, and Handouts Education comprehension: verbalized understanding, returned demonstration, verbal cues required, and needs further education   HOME EXERCISE PROGRAM Last updated: 08/06/22; *See instructions for 09/04/2022 additions to HEP (Standing PWR! Moves exercises at counter with wife's supervision/guarding-2 x 5 reps, 3 times/week) Access Code: [email or web address] URL: [email or web address] Date: 08/06/2022 Prepared by: [facility]  Program Notes Perform standing exercises with supervision only!  Exercises - Seated Hip Abduction  - 1 x daily - 5 x weekly - 2 sets - 10 reps - Seated Long Arc Quad  - 1 x daily - 5 x weekly - 2 sets - 10 reps - Sit to Stand with Counter Support  - 1 x daily - 5 x weekly - 2 sets - 10 reps - Standing Hip Abduction with Counter Support  - 1 x daily - 5 x weekly - 2 sets - 10 reps - Standing Alternating Knee Flexion with Ankle Weights  - 1 x daily - 5 x weekly - 2 sets - 10 reps - Forward Backward Weight Shift with Counter Support  -  1 x daily - 5 x weekly - 2 sets - 10 reps   -------------------------------------------------------------------------------------------------------------------------   From eval  DIAGNOSTIC FINDINGS: see MAR   COGNITION: Overall cognitive status: Within functional limits for tasks assessed             SENSATION: WFL   COORDINATION: Bradykinesia, difficulty wit rapid alternating movements     MUSCLE TONE: WFL       POSTURE: No Significant postural limitations   LOWER EXTREMITY ROM:      WFL  (Blank rows = not tested)   LOWER EXTREMITY MMT:     MMT Right Eval Left Eval  Hip flexion 5 4  Hip extension 5 3+  Hip abduction 5 3-  Hip adduction      Hip internal rotation      Hip external rotation      Knee flexion 5 4  Knee extension 5 4  Ankle dorsiflexion 5 4+  Ankle plantarflexion      Ankle  inversion      Ankle eversion      (Blank rows = not tested)   BED MOBILITY:  Independent   TRANSFERS: Assistive device utilized: Environmental consultant - 2 wheeled  Sit to stand: Modified independence Stand to sit: Modified independence Chair to chair: SBA and CGA Floor:  DNT   RAMP:  Not available   CURB:  Level of Assistance: SBA and CGA Assistive device utilized: Environmental consultant - 2 wheeled Curb Comments: undershoots with LLE   STAIRS:           Level of Assistance: SBA and CGA           Stair Negotiation Technique: Alternating Pattern  with Bilateral Rails           Number of Stairs: 6             Height of Stairs: 4-6"            Comments: dysmetria w/ LLE   GAIT: Gait pattern: circumduction- Left and ataxic Distance walked: 100 Assistive device utilized: Walker - 2 wheeled Level of assistance: SBA Comments: dysmetria LLE   FUNCTIONAL TESTs:  Timed up and go (TUG): 20.25 with RW Berg Balance Scale: 35/56      M-CTSIB  Condition 1: Firm Surface, EO 30 Sec, Mild Sway  Condition 2: Firm Surface, EC 30 Sec, Mild and Moderate Sway  Condition 3: Foam Surface, EO 9 Sec, Severe Sway  Condition 4: Foam Surface, EC 0 Sec,  unable  Sway     07/02/22 M-CTSIB  Condition 1: Firm Surface, EO 30 Sec, Mild and Moderate Sway  Condition 2: Firm Surface, EC 30 Sec, Moderate Sway * required RW support to get into romberg  Condition 3: Foam Surface, EO 30 Sec, Severe Sway * required RW support to get into romberg  Condition 4: Foam Surface, EC NT Sec, NT Sway          PATIENT SURVEYS:  FOTO 54.43% functional status   TODAY'S TREATMENT:  Evaluation, sidestepping along counter, tandem stance at counter     PATIENT EDUCATION: Education details: scope of PT intervention Person educated: Patient and Spouse Education method: Explanation Education comprehension: verbalized understanding     HOME EXERCISE PROGRAM: sidestepping along counter, tandem stance at counter    -----------------------------------------------------------------------------------------------------------------------    GOALS: Goals reviewed with patient? Yes   SHORT TERM GOALS: Target date: 05/29/2022   Patient will perform HEP with family/caregiver supervision for improved strength, balance, transfers, and gait  Baseline:  Goal status: MET   2.  Patient will achieve 15 seconds for TUG test to manifest reduced risk for falls Baseline: 20.25 with RW; 14 sec with RW Goal status: MET   3. Demo modified independent gait level surfaces and curb negotiation            Baseline: CGA-SBA with RW; unchanged 07/02/22; SBA 08/13/22; modified indep level surfaces w/ RW, supervision for curb            Goal status: On-going (09/10/22)   LONG TERM GOALS: Target date: 09/24/2022   Patient will demonstrate score 45/56 Berg Balance Test to manifest low risk for falls Baseline: 35/56; 37/56 on 06/06/22; 37/56 07/02/22; 40/56 08/13/22; (09/10/22) 44/56 Goal status: On-going   2.  Demonstrate improved static balance and postural control per time of 15 sec condition 4 M-CTSIB to improve safety with mobility on uneven surfaces Baseline: complete LOB left with condition 3; NT d/t safety 07/02/22; 8/21/203 15.66 sec, 10 sec Goal status: MET 08/13/22   3.  Demo modified independent gait using least restrictive AD over various surfaces and ascend/descend stairs with set-up assist to improve functional mobility Baseline: able to navigate stairs with B handrail with CGA 07/02/22; able with 1 handrail 08/13/22 Goal status: MET 08/13/22   4.  Demo score of 57% functional status FOTO  Baseline: 54.43%; 57% 07/02/22 Goal status: MET       ASSESSMENT:   CLINICAL IMPRESSION: Tolerating modified PWR move activities quite well with improved ability to maintain postural stability and distal dexterity and crossing midline.  Improved control with cues for upright trunk posture and extension which facilitates  improved motor control and good self-correction appreciated with isolated activities e.g. PWR twist.  Discussed end of care at next session and recommend that pt attempt activities on an HEP basis and potentially resume care/ PT screen over the next few months to facilitate pt self-efficacy and provide for screening services to intervene if functional decline or new concern/issue is noticed.   OBJECTIVE IMPAIRMENTS Abnormal gait, decreased activity tolerance, decreased balance, decreased coordination, decreased endurance, decreased knowledge of use of DME, difficulty walking, decreased strength, impaired perceived functional ability, and improper body mechanics.    ACTIVITY LIMITATIONS carrying, lifting, bending, standing, squatting, stairs, transfers, and locomotion level   PARTICIPATION LIMITATIONS: meal prep, cleaning, laundry, driving, shopping, community activity, and yard work   PERSONAL FACTORS Age, Fitness, and Time since onset of injury/illness/exacerbation are also affecting patient's functional outcome.    REHAB POTENTIAL: Excellent   CLINICAL DECISION MAKING: Evolving/moderate complexity   EVALUATION COMPLEXITY: Moderate   PLAN: PT FREQUENCY: 1x/week   PT DURATION: 6 weeks   PLANNED INTERVENTIONS: Therapeutic exercises, Therapeutic activity, Neuromuscular re-education, Balance training, Gait training, Patient/Family education, Joint mobilization, Stair training, Vestibular training, Canalith repositioning, DME instructions, Aquatic Therapy, Electrical stimulation, Wheelchair mobility training, and Manual therapy   PLAN FOR NEXT SESSION:  D/C summary? Discussed trial basis of HEP and performing a screen/ new POC in a couple of months to assess for progress/decline?  4:20 PM, 09/17/22 M. Sherlyn Lees, PT, DPT Physical Therapist- [place] Office Number: [phone]    [place] [facility] [address] [place], [place]  [address] Phone # [phone] Fax # [phone]

## 2022-09-19 ENCOUNTER — Inpatient Hospital Stay (HOSPITAL_BASED_OUTPATIENT_CLINIC_OR_DEPARTMENT_OTHER): Payer: Medicare Other | Admitting: Internal Medicine

## 2022-09-19 ENCOUNTER — Encounter: Payer: Self-pay | Admitting: Internal Medicine

## 2022-09-19 ENCOUNTER — Inpatient Hospital Stay: Payer: Medicare Other

## 2022-09-19 DIAGNOSIS — D469 Myelodysplastic syndrome, unspecified: Secondary | ICD-10-CM

## 2022-09-19 DIAGNOSIS — D461 Refractory anemia with ring sideroblasts: Secondary | ICD-10-CM | POA: Diagnosis not present

## 2022-09-19 LAB — BASIC METABOLIC PANEL
Anion gap: 5 (ref 5–15)
BUN: 34 mg/dL — ABNORMAL HIGH (ref 8–23)
CO2: 25 mmol/L (ref 22–32)
Calcium: 9.4 mg/dL (ref 8.9–10.3)
Chloride: 109 mmol/L (ref 98–111)
Creatinine, Ser: 1.44 mg/dL — ABNORMAL HIGH (ref 0.61–1.24)
GFR, Estimated: 50 mL/min — ABNORMAL LOW (ref >60)
Glucose, Bld: 150 mg/dL — ABNORMAL HIGH (ref 70–99)
Potassium: 4.8 mmol/L (ref 3.5–5.1)
Sodium: 139 mmol/L (ref 135–145)

## 2022-09-19 LAB — CBC WITH DIFFERENTIAL/PLATELET
Abs Immature Granulocytes: 0.07 10*3/uL (ref 0.00–0.07)
Basophils Absolute: 0 10*3/uL (ref 0.0–0.1)
Basophils Relative: 1 %
Eosinophils Absolute: 0.1 10*3/uL (ref 0.0–0.5)
Eosinophils Relative: 2 %
HCT: 28.2 % — ABNORMAL LOW (ref 39.0–52.0)
Hemoglobin: 9 g/dL — ABNORMAL LOW (ref 13.0–17.0)
Immature Granulocytes: 1 %
Lymphocytes Relative: 23 %
Lymphs Abs: 1.5 10*3/uL (ref 0.7–4.0)
MCH: 31.9 pg (ref 26.0–34.0)
MCHC: 31.9 g/dL (ref 30.0–36.0)
MCV: 100 fL (ref 80.0–100.0)
Monocytes Absolute: 0.4 10*3/uL (ref 0.1–1.0)
Monocytes Relative: 6 %
Neutro Abs: 4.4 10*3/uL (ref 1.7–7.7)
Neutrophils Relative %: 67 %
Platelets: 286 10*3/uL (ref 150–400)
RBC: 2.82 MIL/uL — ABNORMAL LOW (ref 4.22–5.81)
RDW: 25.5 % — ABNORMAL HIGH (ref 11.5–15.5)
WBC: 6.5 10*3/uL (ref 4.0–10.5)
nRBC: 0.3 % — ABNORMAL HIGH (ref 0.0–0.2)

## 2022-09-19 LAB — IRON AND TIBC
Iron: 153 ug/dL (ref 45–182)
Saturation Ratios: 48 % — ABNORMAL HIGH (ref 17.9–39.5)
TIBC: 319 ug/dL (ref 250–450)
UIBC: 166 ug/dL

## 2022-09-19 LAB — VITAMIN B12: Vitamin B-12: 795 pg/mL (ref 180–914)

## 2022-09-19 LAB — FERRITIN: Ferritin: 826 ng/mL — ABNORMAL HIGH (ref 24–336)

## 2022-09-19 MED ORDER — DARBEPOETIN ALFA 100 MCG/0.5ML IJ SOSY
100.0000 ug | PREFILLED_SYRINGE | Freq: Once | INTRAMUSCULAR | Status: AC
  Administered 2022-09-19: 100 ug via SUBCUTANEOUS
  Filled 2022-09-19: qty 0.5

## 2022-09-19 NOTE — Assessment & Plan Note (Addendum)
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  Patient currently on Retacrit [since April 2021]; hemoglobin between 8-9.  Patient is fairly asymptomatic. STABLE.  Continue Retacrit every 2 weeks.  # Discussed with the patient that if he gets more fatigue/symptomatic the options include Luspatercept/Revlimid.  #Today hemoglobin is 9.2- STABLE. Continue Aranesp for now.  FEB 2022- Ferritin-900; [recently started Fe/B12] pending iron studies/B12 in 3 months.  Okay to come off of oral iron.  # Intracranial bleed while on Eliquis/Hx of stroke s/p evacuation- [at home > 2 months]- OFF ELIQUIS.  Stable.  # Hx Of A.fib [OFF eliquis secondary to intracranial bleed]- s/p watchman device- on asprin ONLY.   # Stage kidney disease-stage III [dr.Korrapati]-STABLE  # DISPOSITION:  # aranesp today # in 2 week- lab- H&H; possible aranesp # in 4 weeks- lab- H&H; possible aranesp # in 6 weeks- lab- H&H; possible aranesp # in 8 weeks- lab- H&H; possible aranesp # in 10 weeks- lab- H&H; possible aranesp # # in 12  weeks- MD; labs-cbc/bmp/ aranesp;;iron studies/ferritin; B12 levels-Dr.B

## 2022-09-19 NOTE — Progress Notes (Signed)
[facility] NOTE  Patient Care Team: Baxter Hire, MD as PCP - General (Internal Medicine) Vickie Epley, MD as PCP - Electrophysiology (Cardiology) Cammie Sickle, MD as Consulting Physician (Hematology and Oncology)  CHIEF COMPLAINTS/PURPOSE OF CONSULTATION: MDS  Oncology History Overview Note  # EGD-none; colonoscopy-never [Cologurad x2- NEG];FEB 2021- US- abdomen-no liver disease/ Mild splenic enlargement [460cc; kidney cysts]; L93 folic acid/LDH haptoglobin normal.  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    # NGS/MOLECULAR TESTS:[facility] ON hem- May 2021- Cux-1; DNMT3A; SF3B**  # PALLIATIVE CARE EVALUATION: NA   DIAGNOSIS: MDS low risk   GOALS: Control  CURRENT/MOST RECENT THERAPY : Retacrit    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[place])  02/18/2020 Initial Diagnosis   MDS (myelodysplastic syndrome), low grade (HCC)     HISTORY OF PRESENTING ILLNESS: Ambulating i in a wheelchair.  Accompanied by his wife.  Vernon [name] 76 y.o.  male with history of recently diagnosed low-grade MDS; A-fib on Eliquis currently on Retacrit is here for follow-up.  Patient denies new problems/concerns today.  Patient currently status post left atrial appendage occlusive device placement with 20m Watchman FLX device. on ASA '81mg'$  QD monotherapy. He is scheduled for repeat CT imaging 09/26/22 to ensure adequate device placement with no leak or device thrombosis.   Denies any worsening fatigue.  Denies any nausea vomiting abdominal pain.  No weight loss.  No worsening shortness of breath  or cough.  Review of Systems  Constitutional:  Positive for malaise/fatigue. Negative for chills, diaphoresis, fever and weight loss.  HENT:  Negative for nosebleeds and sore throat.   Eyes:  Negative for double vision.  Respiratory:  Negative for cough, hemoptysis, sputum production, shortness of breath and wheezing.   Cardiovascular:  Negative for chest pain, palpitations, orthopnea and leg swelling.  Gastrointestinal:  Negative for abdominal pain, blood in stool, constipation, diarrhea, heartburn, melena, nausea and vomiting.  Genitourinary:  Negative for dysuria, frequency and urgency.  Musculoskeletal:  Negative for back pain and joint pain.  Skin: Negative.  Negative for itching and rash.  Neurological:  Negative for dizziness, tingling, focal weakness, weakness and headaches.  Endo/Heme/Allergies:  Does not bruise/bleed easily.  Psychiatric/Behavioral:  Negative for depression. The patient is not nervous/anxious and does not have insomnia.     MEDICAL HISTORY:  Past Medical History:  Diagnosis Date  . Anemia   . Aortic stenosis   . Arthritis   . Complication of anesthesia    hard time getting bp up after knee replacement  . Diabetes mellitus without complication ([place]   . GERD (gastroesophageal reflux disease)    occ tums prn  . History of hiatal hernia   . Hypertension   . 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[place]   . Presence of Watchman left atrial appendage closure device 07/26/2022   212mWatchman FLX with Dr. LaQuentin Ore  SURGICAL HISTORY: Past Surgical History:  Procedure Laterality Date  . CARPAL TUNNEL RELEASE  2012  . CRANIOTOMY N/A 02/10/2022   Procedure: SUBOCCIPITAL CRANIECTOMY FOR EVACUATION OF CEREBELLAR HEMATOMA;  Surgeon: ElKristeen MissMD;  [facility] Service: Neurosurgery;  Laterality: N/A;  . JOINT REPLACEMENT Right 2010  .  LEFT ATRIAL APPENDAGE OCCLUSION N/A 07/26/2022   Procedure: LEFT ATRIAL APPENDAGE OCCLUSION;  Surgeon: Vickie Epley, MD;   Location: [facility];  Service: Cardiovascular;  Laterality: N/A;  . SHOULDER ARTHROSCOPY WITH ROTATOR CUFF REPAIR AND OPEN BICEPS TENODESIS Right 11/09/2019   Procedure: RIGHT SHOULDER ARTHROSCOPY WITH SUBSCAPULARIS REPAIR, SUBACROMIAL DECOMPRESSION,MINI OPEN ROTATOR CUFF REPAIR;  Surgeon: Leim Fabry, MD;  Location: [facility];  Service: Orthopedics;  Laterality: Right;  . TEE WITHOUT CARDIOVERSION N/A 07/26/2022   Procedure: TRANSESOPHAGEAL ECHOCARDIOGRAM (TEE);  Surgeon: Vickie Epley, MD;  Location: [facility];  Service: Cardiovascular;  Laterality: N/A;    SOCIAL HISTORY: Social History   Socioeconomic History  . Marital status: Married    Spouse name: Not on file  . Number of children: Not on file  . Years of education: Not on file  . Highest education level: Not on file  Occupational History  . Not on file  Tobacco Use  . Smoking status: Former    Packs/day: 0.50    Years: 8.00    Total pack years: 4.00    Types: Cigarettes    Quit date: 07/21/1978    Years since quitting: 44.1  . Smokeless tobacco: Never  Vaping Use  . Vaping Use: Never used  Substance and Sexual Activity  . Alcohol use: Yes    Alcohol/week: 0.0 - 1.0 standard drinks of alcohol    Comment: rare beer  . Drug use: Never  . Sexual activity: Not on file  Other Topics Concern  . Not on file  Social History Narrative   Lives in [place]; with wife; quit smoking in early 23s; ocassional/ rare [may be 1 a month beer]. 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    Social Determinants of Health   Financial Resource Strain: Not on file  Food Insecurity: Not on file  Transportation Needs: Not on file  Physical Activity: Not on file  Stress: Not on file  Social Connections: Not on file  Intimate Partner Violence: Not on file    FAMILY HISTORY: Family History  Problem Relation Age of Onset  . Cancer Maternal Uncle     ALLERGIES:  has No Known Allergies.  MEDICATIONS:  Current  Outpatient Medications  Medication Sig Dispense Refill  . ACCU-CHEK GUIDE test strip USE 1 STRIP 3 TIMES A DAY AS INSTRUCTED    . acetaminophen (TYLENOL) 650 MG CR tablet Take 650 mg by mouth every 8 (eight) hours as needed for pain.    Marland Kitchen allopurinol (ZYLOPRIM) 100 MG tablet Take 100 mg by mouth daily as needed (gout).    Marland Kitchen aspirin EC 81 MG tablet Take 1 tablet (81 mg total) by mouth daily. Swallow whole. STOP ELIQUIS ON 10/15 AND START ASPIRIN 81 MG ON 10/16 90 tablet 3  . metFORMIN (GLUCOPHAGE) 500 MG tablet Take 500 mg by mouth 2 (two) times daily with a meal.    . apixaban (ELIQUIS) 2.5 MG TABS tablet Take 1 tablet (2.5 mg total) by mouth 2 (two) times daily. (Patient not taking: Reported on 09/19/2022) 120 tablet 1   Current Facility-Administered Medications  Medication Dose Route Frequency Provider Last Rate Last Admin  . 0.9 %  sodium chloride infusion   Intravenous Continuous Vickie Epley, MD      . 0.9 %  sodium chloride infusion   Intravenous Continuous Vickie Epley, MD          PHYSICAL EXAMINATION:   Vitals:   09/19/22 1000  BP:  128/70  Pulse: (!) 55  Resp: 18  Temp: 97.7 F (36.5 C)  SpO2: 98%   Filed Weights   09/19/22 1000  Weight: 204 lb 1.6 oz (92.6 kg)    Physical Exam HENT:     Head: Normocephalic and atraumatic.     Mouth/Throat:     Pharynx: No oropharyngeal exudate.  Eyes:     Pupils: Pupils are equal, round, and reactive to light.  Cardiovascular:     Rate and Rhythm: Normal rate and regular rhythm.  Pulmonary:     Effort: Pulmonary effort is normal. No respiratory distress.     Breath sounds: Normal breath sounds. No wheezing.  Abdominal:     General: Bowel sounds are normal. There is no distension.     Palpations: Abdomen is soft. There is no mass.     Tenderness: There is no abdominal tenderness. There is no guarding or rebound.  Musculoskeletal:        General: No tenderness. Normal range of motion.     Cervical back: Normal  range of motion and neck supple.  Skin:    General: Skin is warm.  Neurological:     Mental Status: He is alert and oriented to person, place, and time.  Psychiatric:        Mood and Affect: Affect normal.   LABORATORY DATA:  I have reviewed the data as listed Lab Results  Component Value Date   WBC 6.5 09/19/2022   HGB 9.0 (L) 09/19/2022   HCT 28.2 (L) 09/19/2022   MCV 100.0 09/19/2022   PLT 286 09/19/2022   Recent Labs    02/20/22 0348 02/21/22 0117 02/22/22 0442 02/27/22 0520 03/12/22 0513 04/04/22 1303 06/21/22 1122 06/27/22 1412 07/23/22 1340 08/27/22 1332 09/19/22 0958  NA 146* 142   < > 139   < > 134*   < > 139 142 142 139  K 4.9 5.2*   < > 4.9   < > 4.4   < > 4.3 4.6 5.1 4.8  CL 113* 112*   < > 106   < > 108   < > 108 107* 107* 109  CO2 25 25   < > 27   < > 18*   < > '24 27 27 25  '$ GLUCOSE 144* 121*   < > 175*   < > 174*   < > 98 151* 117* 150*  BUN 35* 35*   < > 41*   < > 65*   < > 28* 33* 25 34*  CREATININE 1.36* 1.35*   < > 1.69*   < > 1.47*   < > 1.35* 1.41* 1.51* 1.44*  CALCIUM 9.5 9.4   < > 9.0   < > 8.9   < > 9.2 10.0 9.7 9.4  GFRNONAA 54* 55*   < > 42*   < > 49*  --  54*  --   --  50*  PROT 6.5 6.7  --  6.6  --   --   --   --   --   --   --   ALBUMIN 3.3* 3.4*  --  3.2*  --   --   --   --   --   --   --   AST 45* 43*  --  22  --   --   --   --   --   --   --   ALT 31 33  --  24  --   --   --   --   --   --   --  ALKPHOS 132* 147*  --  162*  --   --   --   --   --   --   --   BILITOT 1.9* 1.7*  --  2.0*  --   --   --   --   --   --   --    < > = values in this interval not displayed.     No results found.  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  Patient currently on Retacrit [since April 2021]; hemoglobin between 8-9.  Patient is fairly asymptomatic. STABLE.  Continue Retacrit every 2 weeks.  # Discussed with the patient that if he gets more  fatigue/symptomatic the options include Luspatercept/Revlimid.  #Today hemoglobin is 9.2- STABLE. Continue Aranesp for now.  FEB 2022- Ferritin-900; [recently started Fe/B12] will recheck iron studies/B12 in 3 months.  # Intracranial bleed while on Eliquis/Hx of stroke s/p evacuation- [at home > 2 months]- OFF ELIQUIS.   # Hx Of A.fib [OFF eliquis secondary to intracranial bleed]- s/p watchman device- on asprin ONLY.   # Stage kidney disease-stage III [dr.Korrapati]-STABLE  # DISPOSITION:  # aranesp today # in 2 week- lab- H&H; possible aranesp # in 4 weeks- lab- H&H; possible aranesp # in 6 weeks- lab- H&H; possible aranesp # in 8 weeks- lab- H&H; possible aranesp # in 10 weeks- lab- H&H; possible aranesp # # in 12  weeks- MD; labs-cbc/bmp/ aranesp;;iron studies/ferritin; B12 levels-Dr.B  All questions were answered. The patient knows to call the clinic with any problems, questions or concerns.    Cammie Sickle, MD 09/19/2022 12:02 PM

## 2022-09-19 NOTE — Progress Notes (Signed)
Patient denies new problems/concerns today.   °

## 2022-09-24 ENCOUNTER — Telehealth (HOSPITAL_COMMUNITY): Payer: Self-pay | Admitting: Emergency Medicine

## 2022-09-24 ENCOUNTER — Ambulatory Visit: Payer: Medicare Other

## 2022-09-24 DIAGNOSIS — R2681 Unsteadiness on feet: Secondary | ICD-10-CM | POA: Diagnosis not present

## 2022-09-24 DIAGNOSIS — M6281 Muscle weakness (generalized): Secondary | ICD-10-CM

## 2022-09-24 DIAGNOSIS — R2689 Other abnormalities of gait and mobility: Secondary | ICD-10-CM

## 2022-09-24 DIAGNOSIS — R262 Difficulty in walking, not elsewhere classified: Secondary | ICD-10-CM

## 2022-09-24 NOTE — Telephone Encounter (Signed)
Reaching out to patient to offer assistance regarding upcoming cardiac imaging study; pt verbalizes understanding of appt date/time, parking situation and where to check in, pre-test NPO status and medications ordered, and verified current allergies; name and call back number provided for further questions should they arise Marchia Bond RN Navigator Cardiac Imaging [facility] [phone] office [phone] cell   Arrival 65 wc entrance Denies iv issues Daily meds per usual except allergy meds

## 2022-09-24 NOTE — Therapy (Signed)
OUTPATIENT PHYSICAL THERAPY TREATMENT and D/C Summary  Patient Name: Vernon [name] MRN: [id number] DOB:[birth year], 76 y.o., male Today's Date: 09/24/2022  PCP: Baxter Hire, MD  REFERRING PROVIDER: Baxter Hire, MD   PHYSICAL THERAPY DISCHARGE SUMMARY  Visits from Start of Care: 31  Current functional level related to goals / functional outcomes: Able to progress with POC details and meet majority of goals w/ significant improved Berg Balance Test score   Remaining deficits: LUE ataxia, balance deficits, LLE coordination   Education / Equipment: HEP   Patient agrees to discharge. Patient goals were partially met. Patient is being discharged due to being pleased with the current functional level.   END OF SESSION:   PT End of Session - 09/24/22 1622     Visit Number 31    Number of Visits 31    Date for PT Re-Evaluation 09/24/22    Authorization Type [employer] Medicare    Progress Note Due on Visit 42    PT Start Time 1615    PT Stop Time 1700    PT Time Calculation (min) 45 min    Equipment Utilized During Treatment Gait belt    Activity Tolerance Patient tolerated treatment well    Behavior During Therapy WFL for tasks assessed/performed                          Past Medical History:  Diagnosis Date   Anemia    Aortic stenosis    Arthritis    Complication of anesthesia    hard time getting bp up after knee replacement   Diabetes mellitus without complication (HCC)    GERD (gastroesophageal reflux disease)    occ tums prn   History of hiatal hernia    Hypertension    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[place])    Presence of Watchman left atrial appendage closure device 07/26/2022   33m Watchman FLX with Dr. 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;  Surgeon: EKristeen Miss MD;  [facility]  Service: Neurosurgery;  Laterality: N/A;   JOINT REPLACEMENT Right 2010   LEFT ATRIAL APPENDAGE OCCLUSION N/A 07/26/2022   Procedure: LEFT ATRIAL APPENDAGE OCCLUSION;  Surgeon: LVickie Epley MD;  [facility];  Service: Cardiovascular;  Laterality: N/A;   SHOULDER ARTHROSCOPY WITH ROTATOR CUFF REPAIR AND OPEN BICEPS TENODESIS Right 11/09/2019   Procedure: RIGHT SHOULDER ARTHROSCOPY WITH SUBSCAPULARIS REPAIR, SUBACROMIAL DECOMPRESSION,MINI OPEN ROTATOR CUFF REPAIR;  Surgeon: PLeim Fabry MD;  Location: [facility];  Service: Orthopedics;  Laterality: Right;   TEE WITHOUT CARDIOVERSION N/A 07/26/2022   Procedure: TRANSESOPHAGEAL ECHOCARDIOGRAM (TEE);  Surgeon: LVickie Epley MD;  [facility];  Service: Cardiovascular;  Laterality: N/A;   Patient Active Problem List   Diagnosis Date Noted   Atrial fibrillation ([place] 07/26/2022   Presence of Watchman left atrial appendage closure device 07/26/2022   Proteinuria, unspecified 05/23/2022   Simple renal cyst 05/23/2022   Long term current use of anticoagulant 04/04/2022   Rotator cuff syndrome 04/04/2022   Exposure to potentially hazardous substance 04/04/2022   Gout 04/04/2022   Osteoarthritis 04/04/2022   Osteoporosis 04/04/2022   Other specified diseases of hair and hair follicles 003/88/8280  Pain in joint, lower leg 04/04/2022   Problem related to unspecified psychosocial circumstances 04/04/2022  General medical examination for administrative purposes 04/04/2022   Stage 3b chronic kidney disease ([place])    Dysphagia, post-stroke    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 hemorrhage of brain ([place]) 02/26/2022   Cough 26/83/4196   Acute metabolic encephalopathy 22/29/7989   Obstructive hydrocephalus ([place]) 02/19/2022   Dyslipidemia 02/19/2022   Dysphagia 02/19/2022   AKI (acute kidney injury) ([place])    Hyperkalemia    Anemia    Pleural effusion on right    Respiratory failure requiring intubation (HCC)    Cerebral  edema (HCC)    Chronic atrial fibrillation ([place])    Controlled type 2 diabetes mellitus with hyperglycemia, without long-term current use of insulin (HCC)    ICH (intracerebral hemorrhage) ([place]) 02/10/2022   Intracranial hemorrhage (HCC)    Anticoagulated    Moderate tricuspid regurgitation 07/26/2021   Moderate aortic valve stenosis 03/08/2020   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[place]) 02/18/2020   Macrocytic anemia 01/13/2020   Spleen enlargement 01/13/2020   Bilateral carotid artery stenosis 07/14/2018   Atrial fibrillation, chronic ([place]) 07/14/2018   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 NO:    OBJECTIVE:   TODAY'S TREATMENT: 09/24/22 Activity Comments  Stair ambulation Modified indep with BHR  Berg Balance 44/56  Gait training Curb mgmt with RW and modified indep  Pt education   POC details review               PATIENT EDUCATION: Education details: *See instructions for 09/04/2022 additions to HEP; importance for wife to provide supervision/min guard and cues for control, pacing of exercises; educated to stop performing if they don't feel comfortable doing this at home. Person educated: Patient and Spouse Education method: Explanation, Demonstration, Verbal cues, and Handouts Education comprehension: verbalized understanding, returned demonstration, verbal cues required, and needs  further education   HOME EXERCISE PROGRAM Last updated: 08/06/22; *See instructions for 09/04/2022 additions to HEP (Standing PWR! Moves exercises at counter with wife's supervision/guarding-2 x 5 reps, 3 times/week) Access Code: [email or web address] URL: [email or web address] Date: 08/06/2022 Prepared by: [facility]  Program Notes Perform standing exercises with supervision only!  Exercises - Seated Hip Abduction  - 1 x daily - 5 x weekly - 2 sets - 10 reps - Seated Long Arc Quad  - 1 x daily - 5 x weekly - 2 sets - 10 reps - Sit to Stand with Counter Support  - 1 x daily - 5 x weekly - 2 sets - 10 reps - Standing Hip Abduction with Counter Support  - 1 x daily - 5 x weekly - 2 sets - 10 reps - Standing Alternating Knee Flexion with Ankle Weights  - 1 x daily - 5 x weekly - 2 sets - 10 reps - Forward Backward Weight Shift with Counter Support  - 1 x daily - 5 x weekly - 2 sets - 10 reps   -------------------------------------------------------------------------------------------------------------------------   From eval  DIAGNOSTIC FINDINGS: see MAR   COGNITION: Overall  cognitive status: Within functional limits for tasks assessed             SENSATION: WFL   COORDINATION: Bradykinesia, difficulty wit rapid alternating movements     MUSCLE TONE: WFL       POSTURE: No Significant postural limitations   LOWER EXTREMITY ROM:      WFL  (Blank rows = not tested)   LOWER EXTREMITY MMT:     MMT Right Eval Left Eval  Hip flexion 5 4  Hip extension 5 3+  Hip abduction 5 3-  Hip adduction      Hip internal rotation      Hip external rotation      Knee flexion 5 4  Knee extension 5 4  Ankle dorsiflexion 5 4+  Ankle plantarflexion      Ankle inversion      Ankle eversion      (Blank rows = not tested)   BED MOBILITY:  Independent   TRANSFERS: Assistive device utilized: Environmental consultant - 2 wheeled  Sit to stand: Modified  independence Stand to sit: Modified independence Chair to chair: SBA and CGA Floor:  DNT   RAMP:  Not available   CURB:  Level of Assistance: SBA and CGA Assistive device utilized: Environmental consultant - 2 wheeled Curb Comments: undershoots with LLE   STAIRS:           Level of Assistance: SBA and CGA           Stair Negotiation Technique: Alternating Pattern  with Bilateral Rails           Number of Stairs: 6             Height of Stairs: 4-6"            Comments: dysmetria w/ LLE   GAIT: Gait pattern: circumduction- Left and ataxic Distance walked: 100 Assistive device utilized: Walker - 2 wheeled Level of assistance: SBA Comments: dysmetria LLE   FUNCTIONAL TESTs:  Timed up and go (TUG): 20.25 with RW Berg Balance Scale: 35/56      M-CTSIB  Condition 1: Firm Surface, EO 30 Sec, Mild Sway  Condition 2: Firm Surface, EC 30 Sec, Mild and Moderate Sway  Condition 3: Foam Surface, EO 9 Sec, Severe Sway  Condition 4: Foam Surface, EC 0 Sec,  unable  Sway     07/02/22 M-CTSIB  Condition 1: Firm Surface, EO 30 Sec, Mild and Moderate Sway  Condition 2: Firm Surface, EC 30 Sec, Moderate Sway * required RW support to get into romberg  Condition 3: Foam Surface, EO 30 Sec, Severe Sway * required RW support to get into romberg  Condition 4: Foam Surface, EC NT Sec, NT Sway          PATIENT SURVEYS:  FOTO 54.43% functional status   TODAY'S TREATMENT:  Evaluation, sidestepping along counter, tandem stance at counter     PATIENT EDUCATION: Education details: scope of PT intervention Person educated: Patient and Spouse Education method: Explanation Education comprehension: verbalized understanding     HOME EXERCISE PROGRAM: sidestepping along counter, tandem stance at counter   -----------------------------------------------------------------------------------------------------------------------    GOALS: Goals reviewed with patient? Yes   SHORT TERM GOALS: Target date:  05/29/2022   Patient will perform HEP with family/caregiver supervision for improved strength, balance, transfers, and gait  Baseline: Goal status: MET   2.  Patient will achieve 15 seconds for TUG test to manifest reduced risk for falls Baseline: 20.25 with RW; 14 sec with RW  Goal status: MET   3. Demo modified independent gait level surfaces and curb negotiation            Baseline: CGA-SBA with RW; unchanged 07/02/22; SBA 08/13/22; modified indep level surfaces w/ RW, supervision for curb. Modified indep w/ RW            Goal status: MET   LONG TERM GOALS: Target date: 09/24/2022   Patient will demonstrate score 45/56 Berg Balance Test to manifest low risk for falls Baseline: 35/56; 37/56 on 06/06/22; 37/56 07/02/22; 40/56 08/13/22; (09/10/22) 44/56, unchanged on 09/24/22 Goal status: NOT MET 2.  Demonstrate improved static balance and postural control per time of 15 sec condition 4 M-CTSIB to improve safety with mobility on uneven surfaces Baseline: complete LOB left with condition 3; NT d/t safety 07/02/22; 8/21/203 15.66 sec, 10 sec Goal status: MET 08/13/22   3.  Demo modified independent gait using least restrictive AD over various surfaces and ascend/descend stairs with set-up assist to improve functional mobility Baseline: able to navigate stairs with B handrail with CGA 07/02/22; able with 1 handrail 08/13/22 Goal status: MET 08/13/22   4.  Demo score of 57% functional status FOTO  Baseline: 54.43%; 57% 07/02/22 Goal status: MET       ASSESSMENT:   CLINICAL IMPRESSION: 3/3 STG met and 3/4 LTG met. Demo improved functional indepence and mobility since start of care. Will transition to HEP with spouse support and caregiver assistance  OBJECTIVE IMPAIRMENTS Abnormal gait, decreased activity tolerance, decreased balance, decreased coordination, decreased endurance, decreased knowledge of use of DME, difficulty walking, decreased strength, impaired perceived functional  ability, and improper body mechanics.    ACTIVITY LIMITATIONS carrying, lifting, bending, standing, squatting, stairs, transfers, and locomotion level   PARTICIPATION LIMITATIONS: meal prep, cleaning, laundry, driving, shopping, community activity, and yard work   PERSONAL FACTORS Age, Fitness, and Time since onset of injury/illness/exacerbation are also affecting patient's functional outcome.    REHAB POTENTIAL: Excellent   CLINICAL DECISION MAKING: Evolving/moderate complexity   EVALUATION COMPLEXITY: Moderate   PLAN: PT FREQUENCY: 1x/week   PT DURATION: 6 weeks   PLANNED INTERVENTIONS: Therapeutic exercises, Therapeutic activity, Neuromuscular re-education, Balance training, Gait training, Patient/Family education, Joint mobilization, Stair training, Vestibular training, Canalith repositioning, DME instructions, Aquatic Therapy, Electrical stimulation, Wheelchair mobility training, and Manual therapy   PLAN FOR NEXT SESSION:  D/C to HEP; recommend meeting 150 min/week of moderate intensity exercise  4:24 PM, 09/24/22 M. Sherlyn Lees, PT, DPT Physical Therapist- [place] Office Number: [phone]    [facility] [address], [place], [place] [address] Phone # [phone] Fax # [phone]

## 2022-09-26 ENCOUNTER — Ambulatory Visit (HOSPITAL_COMMUNITY)
Admission: RE | Admit: 2022-09-26 | Discharge: 2022-09-26 | Disposition: A | Discharge status: Home / Self Care | Payer: Medicare Other | Source: Ambulatory Visit | Attending: Cardiology | Admitting: Cardiology

## 2022-09-26 DIAGNOSIS — Z95818 Presence of other cardiac implants and grafts: Secondary | ICD-10-CM | POA: Insufficient documentation

## 2022-09-26 DIAGNOSIS — I482 Chronic atrial fibrillation, unspecified: Secondary | ICD-10-CM | POA: Insufficient documentation

## 2022-09-26 MED ORDER — IOHEXOL 350 MG/ML SOLN
100.0000 mL | Freq: Once | INTRAVENOUS | Status: AC | PRN
  Administered 2022-09-26: 100 mL via INTRAVENOUS

## 2022-10-03 ENCOUNTER — Telehealth: Payer: Self-pay

## 2022-10-03 ENCOUNTER — Inpatient Hospital Stay: Payer: Medicare Other

## 2022-10-03 ENCOUNTER — Inpatient Hospital Stay: Payer: Medicare Other | Attending: Internal Medicine

## 2022-10-03 VITALS — BP 145/63 | HR 42

## 2022-10-03 DIAGNOSIS — D469 Myelodysplastic syndrome, unspecified: Secondary | ICD-10-CM

## 2022-10-03 DIAGNOSIS — D461 Refractory anemia with ring sideroblasts: Secondary | ICD-10-CM | POA: Diagnosis not present

## 2022-10-03 LAB — HEMOGLOBIN AND HEMATOCRIT, BLOOD
HCT: 28.9 % — ABNORMAL LOW (ref 39.0–52.0)
Hemoglobin: 9.3 g/dL — ABNORMAL LOW (ref 13.0–17.0)

## 2022-10-03 MED ORDER — DARBEPOETIN ALFA 100 MCG/0.5ML IJ SOSY
100.0000 ug | PREFILLED_SYRINGE | Freq: Once | INTRAMUSCULAR | Status: AC
  Administered 2022-10-03: 100 ug via SUBCUTANEOUS
  Filled 2022-10-03: qty 0.5

## 2022-10-03 NOTE — Telephone Encounter (Signed)
Called and spoke with the patient's wife. Informed her Dr. Quentin Ore agreed [name] needs to establish with a cardiologist here for further CAD workup.  Also informed her that Dr. Quentin Ore recommends starting a statin.  Ms. Baisley stated their kidney doctor told him never to start a statin as his cholesterol is low enough and he needs cholesterol for brain function. Reiterated to her that prevention will be important to avoid CAD progression.  Since the patient is established with Dr. Nehemiah Massed, offered to call his office to he can work the patient up for CAD (Ms. Habig said Dr. Erin Fulling has never mentioned CAD before) OR to establish him with a cardiologist here.  She stated she will speak with Mr. Bisig to make a decision and call back.

## 2022-10-03 NOTE — Telephone Encounter (Signed)
-----   Message from Geralynn Rile, MD sent at 09/30/2022  4:12 PM EST ----- Baldomero Lamy, he needs a general cards. -W ----- Message ----- From: Vickie Epley, MD Sent: 09/29/2022   1:58 PM EST To: Geralynn Rile, MD; Tommie Raymond, NP; #  Katy/[name]  Lets get him started on Atorvastatin '40mg'$  PO daily and refer to [facility] for further coronary evaluation.  Thanks, Lysbeth Galas T. Quentin Ore, MD, [facility], [facility] Cardiac Electrophysiology     ----- Message ----- From: Theodoro Parma, RN Sent: 09/27/2022   3:47 PM EDT To: Vickie Epley, MD  Hey Dr. Natividad Brood ordered this CT so it didn't go to your basket. While LAAO looks good, he has pretty severe CAD. He will continue ASA 81 qd and his LDL a few months ago was 68. He is asymptomatic. He is scheduled with Sharee Pimple in March 2024 and I told him we will get him in to establish with an MD after that (he wants to leave Dr. Adolph Pollack). Should we do anything else at this time for CAD? Thanks for looking, KK

## 2022-10-11 NOTE — Telephone Encounter (Signed)
The patient and his wife called back after discussing CT results and plan together. They wish to establish with Dr. Audie Box, but not until after the new year. He is "sick" of doctor appointments.  Scheduled Vernon [name] 12/24/2022 with Dr. Audie Box to establish cardiac care.  He will call prior to that appointment or seek emergency medical care if CP or SOB develops. They were grateful for assistance.

## 2022-10-17 ENCOUNTER — Inpatient Hospital Stay: Payer: Medicare Other

## 2022-10-17 VITALS — BP 136/58 | HR 42

## 2022-10-17 DIAGNOSIS — D469 Myelodysplastic syndrome, unspecified: Secondary | ICD-10-CM

## 2022-10-17 DIAGNOSIS — D461 Refractory anemia with ring sideroblasts: Secondary | ICD-10-CM | POA: Diagnosis not present

## 2022-10-17 LAB — HEMOGLOBIN AND HEMATOCRIT, BLOOD
HCT: 28.8 % — ABNORMAL LOW (ref 39.0–52.0)
Hemoglobin: 9.3 g/dL — ABNORMAL LOW (ref 13.0–17.0)

## 2022-10-17 MED ORDER — DARBEPOETIN ALFA 100 MCG/0.5ML IJ SOSY
100.0000 ug | PREFILLED_SYRINGE | Freq: Once | INTRAMUSCULAR | Status: AC
  Administered 2022-10-17: 100 ug via SUBCUTANEOUS
  Filled 2022-10-17: qty 0.5

## 2022-10-23 ENCOUNTER — Ambulatory Visit: Payer: Medicare Other | Admitting: Neurology

## 2022-10-23 ENCOUNTER — Encounter: Payer: Self-pay | Admitting: Neurology

## 2022-10-23 VITALS — BP 159/84 | HR 65 | Ht 69.0 in | Wt 181.8 lb

## 2022-10-23 DIAGNOSIS — G3184 Mild cognitive impairment, so stated: Secondary | ICD-10-CM | POA: Diagnosis not present

## 2022-10-23 DIAGNOSIS — G119 Hereditary ataxia, unspecified: Secondary | ICD-10-CM | POA: Diagnosis not present

## 2022-10-23 DIAGNOSIS — I482 Chronic atrial fibrillation, unspecified: Secondary | ICD-10-CM | POA: Diagnosis not present

## 2022-10-23 DIAGNOSIS — I6789 Other cerebrovascular disease: Secondary | ICD-10-CM

## 2022-10-23 DIAGNOSIS — R270 Ataxia, unspecified: Secondary | ICD-10-CM

## 2022-10-23 NOTE — Progress Notes (Signed)
[facility] [address] [place]. [place] [address] [phone]       OFFICE FOLLOW-UP NOTE  [name] Date of Birth:  03/29/1946 Medical Record Number:  [id number]   HPI: Initial visit 06/07/2022 [name] is a 76 year old Caucasian male seen today for initial office follow-up visit following hospital admission for intracerebral hemorrhage in March 2023.  He is accompanied by his wife.  History is obtained from them and review of electronic medical records and opossum reviewed pertinent available imaging films in PACS. [name] is a 76 y.o. male with history of anemia, arthritis, GERD, DM2, HTN and a-fib on Eliquis presened on 02/10/22 with vomiting and incoordiantion on the left side.  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  His Eliquis was reversed with [name] and he was transferred from outside hospital to [facility]..  His neurological status began to worsen and he was intubated.  Repeat CT demonstrated enlarging ICH.  Neurosurgery was consulted and decompressive suboccipital craniectomy was performed on 02/11/22 by Dr Ellene Route.  3% HTS was started for cerebral edema. Patient has been extubated since 3/23 and did well.  Blood pressure goal tightly controlled.  Eliquis was held.  He was seen by physical occupational speech therapy.  Serial CT scan showed stable appearance of the hemorrhage and mass effect and postoperative changes.  Echocardiogram showed ejection fraction of 55 to 60%.  LDL cholesterol is 25 mg percent.  Hemoglobin A1c is 6.2.  Patient was felt to be a good candidate for inpatient rehab and was transferred there and did well and made gradual improvement and was discharged home on 03/20/2022.  He still getting home physical and occupational therapy which she has finished and recently started outpatient therapies.  He is able to ambulate with a walker but somebody has to stay close to him.  His left-sided coordination is  improving but is not back to normal.  His voice occasionally gets raspy when he is tired and he has some word finding difficulties.  Wife has also noticed that his comprehension is not good and his information processing is also not back to normal.  She has to repeat herself several times at times.  Patient had a follow-up CT scan on 02/20/2022 which showed expected evolutionary changes and postop changes in the left cerebral hemorrhage.  He was restarted on Eliquis which is tolerating well without bruising or bleeding.  Patient and wife are interested in the Watchman procedure and wants me to refer to Dr. Quentin Ore.    Update 10/23/2022 : He returns for follow-up after last visit with me 4 months ago.  Is accompanied by his wife.  He is doing well.  He is noted improvement in his left hemiataxia walking better with a walker.  He has had no falls or injuries.  He still has some trouble with coordination of his left hand when he is trying to rush and not being attention.  He saw Dr. Quentin Ore and underwent Watchman procedure successfully on 07/27/2019.  Without any complications.  He has stopped Eliquis and is now on aspirin milligram alone which is tolerating well without bruising or bleeding.  He states his blood pressure is in the 130s usually at home but today it is a little does not regularly check his blood sugars.  Last hemoglobin A1c was 6.7 in June.  He has not had any falls or injuries.  He blurred vision to see his eye doctor  weight loss from 203 to 184 pounds and.  I advised him to discuss with his primary care physician for further evaluation ROS:   14 system review of systems is positive for incoordination, gait imbalance, blurred vision difficulty thinking, memory loss, word finding difficulty all other systems negative  PMH:  Past Medical History:  Diagnosis Date   Anemia    Aortic stenosis    Arthritis    Complication of anesthesia    hard time getting bp up after knee replacement   Diabetes  mellitus without complication (HCC)    GERD (gastroesophageal reflux disease)    occ tums prn   History of hiatal hernia    Hypertension    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[place])    Presence of Watchman left atrial appendage closure device 07/26/2022   82m Watchman FLX with Dr. LQuentin Ore   Social History:  Social History   Socioeconomic History   Marital status: Married    Spouse name: Not on file   Number of children: Not on file   Years of education: Not on file   Highest education level: Not on file  Occupational History   Not on file  Tobacco Use   Smoking status: Former    Packs/day: 0.50    Years: 8.00    Total pack years: 4.00    Types: Cigarettes    Quit date: 07/21/1978    Years since quitting: 44.2   Smokeless tobacco: Never  Vaping Use   Vaping Use: Never used  Substance and Sexual Activity   Alcohol use: Yes    Alcohol/week: 0.0 - 1.0 standard drinks of alcohol    Comment: rare beer   Drug use: Never   Sexual activity: Not on file  Other Topics Concern   Not on file  Social History Narrative   [place]; quit smoking in early 784s ocassional/ rare [may be 1 a month beer]. 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    Social Determinants of Health   Financial Resource Strain: Not on file  Food Insecurity: Not on file  Transportation Needs: Not on file  Physical Activity: Not on file  Stress: Not on file  Social Connections: Not on file  Intimate Partner Violence: Not on file    Medications:   Current Outpatient Medications on File Prior to Visit  Medication Sig Dispense Refill   acetaminophen (TYLENOL) 650 MG CR tablet Take 650 mg by mouth every 8 (eight) hours as needed for pain.     aspirin EC 81 MG tablet Take 1 tablet (81 mg total) by mouth daily. Swallow whole. STOP ELIQUIS ON 10/15 AND START ASPIRIN 81 MG ON 10/16 90 tablet 3   metFORMIN (GLUCOPHAGE) 500 MG tablet Take 500 mg by mouth 2 (two) times daily with a meal.      Current Facility-Administered Medications on File Prior to Visit  Medication Dose Route Frequency Provider Last Rate Last Admin   0.9 %  sodium chloride infusion   Intravenous Continuous LVickie Epley MD       0.9 %  sodium chloride infusion   Intravenous Continuous LVickie Epley MD        Allergies:  No Known Allergies  Physical Exam General: Obese elderly Caucasian male, seated, in no evident distress Head: head normocephalic and atraumatic.  Neck: supple with no carotid or supraclavicular bruits Cardiovascular: regular rate and rhythm, no murmurs Musculoskeletal: no deformity Skin:  no rash/petichiae Vascular:  Normal pulses all extremities Vitals:  10/23/22 1508  BP: (!) 159/84  Pulse: 65   Neurologic Exam Mental Status: Awake and fully alert. Oriented to place and time. Recent and remote memory intact. Attention span, concentration and fund of knowledge appropriate. Mood and affect appropriate.  Diminished recall 2/3.  Able to name 13 animals which can walk on 4 legs. Cranial Nerves: Fundoscopic exam not done. Pupils equal, briskly reactive to light. Extraocular movements full without nystagmus. Visual fields full to confrontation. Hearing intact. Facial sensation intact. Face, tongue, palate moves normally and symmetrically.  Motor: Normal bulk and tone. Normal strength in all tested extremity muscles. Sensory.: intact to touch ,pinprick .position and vibratory sensation.  Coordination: Mildly impaired left finger-to-nose and knee to heel coordination. Gait and Station: Arises from chair without difficulty. Stance is broad-based uses a wheeled walker gait is slightly ataxic.   Reflexes: 1+ and symmetric. Toes downgoing.   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  I recommend he continue aspirin for stroke prevention and maintain aggressive risk factor modification with strict control of hypertension with blood pressure goal below 140/90, lipids with LDL cholesterol goal below 70 mg and diabetes with hemoglobin A1c goal below 6.5%.  I advised him to use his walker at all times and we discussed fall safety precautions.  He also has mild cognitive impairment and encouraged him to increase participation in cognitively challenging activities like solving crossword puzzles, playing bridge, word searches and sudoku.  We also discussed memory compensation strategies.  He will return for follow-up in the future in 6 months or call earlier if necessary.  I advised him to see his primary care physician to discuss his constipation, weight loss and decreased appetite.  Greater than 50% of time during this 35 minute visit was spent on counseling,explanation of diagnosis, planning of further management, discussion with patient and family and coordination of care Vernon Contras, MD Note: This document was prepared with digital dictation and possible smart phrase technology. Any transcriptional errors that result from this process are unintentional

## 2022-10-23 NOTE — Patient Instructions (Signed)
I had a long discussion with the patient and his wife regarding his recent cerebral hemorrhage related to anticoagulation with Eliquis for his atrial fibrillation and he is doing well after having Watchman device.  I recommend he continue aspirin for stroke prevention and maintain aggressive risk factor modification with strict control of hypertension with blood pressure goal below 140/90, lipids with LDL cholesterol goal below 70 mg and diabetes with hemoglobin A1c goal below 6.5%.  I advised him to use his walker at all times and we discussed fall safety precautions.  He also has mild cognitive impairment and encouraged him to increase participation in cognitively challenging activities like solving crossword puzzles, playing bridge, word searches and sudoku.  We also discussed memory compensation strategies.  He will return for follow-up in the future in 6 months or call earlier if necessary.

## 2022-10-31 ENCOUNTER — Inpatient Hospital Stay: Payer: Medicare Other | Attending: Internal Medicine

## 2022-10-31 ENCOUNTER — Inpatient Hospital Stay: Payer: Medicare Other

## 2022-10-31 VITALS — BP 136/74

## 2022-10-31 DIAGNOSIS — D469 Myelodysplastic syndrome, unspecified: Secondary | ICD-10-CM

## 2022-10-31 DIAGNOSIS — D461 Refractory anemia with ring sideroblasts: Secondary | ICD-10-CM | POA: Diagnosis present

## 2022-10-31 LAB — HEMOGLOBIN AND HEMATOCRIT, BLOOD
HCT: 29.3 % — ABNORMAL LOW (ref 39.0–52.0)
Hemoglobin: 9.5 g/dL — ABNORMAL LOW (ref 13.0–17.0)

## 2022-10-31 MED ORDER — DARBEPOETIN ALFA 100 MCG/0.5ML IJ SOSY
100.0000 ug | PREFILLED_SYRINGE | Freq: Once | INTRAMUSCULAR | Status: AC
  Administered 2022-10-31: 100 ug via SUBCUTANEOUS
  Filled 2022-10-31: qty 0.5

## 2022-11-14 ENCOUNTER — Inpatient Hospital Stay: Payer: Medicare Other

## 2022-11-14 VITALS — BP 153/78

## 2022-11-14 DIAGNOSIS — D469 Myelodysplastic syndrome, unspecified: Secondary | ICD-10-CM

## 2022-11-14 DIAGNOSIS — D461 Refractory anemia with ring sideroblasts: Secondary | ICD-10-CM | POA: Diagnosis not present

## 2022-11-14 LAB — HEMOGLOBIN AND HEMATOCRIT, BLOOD
HCT: 28.7 % — ABNORMAL LOW (ref 39.0–52.0)
Hemoglobin: 9.4 g/dL — ABNORMAL LOW (ref 13.0–17.0)

## 2022-11-14 MED ORDER — DARBEPOETIN ALFA 100 MCG/0.5ML IJ SOSY
100.0000 ug | PREFILLED_SYRINGE | Freq: Once | INTRAMUSCULAR | Status: AC
  Administered 2022-11-14: 100 ug via SUBCUTANEOUS
  Filled 2022-11-14: qty 0.5

## 2022-11-28 ENCOUNTER — Inpatient Hospital Stay: Payer: Medicare Other | Attending: Internal Medicine

## 2022-11-28 ENCOUNTER — Inpatient Hospital Stay (HOSPITAL_BASED_OUTPATIENT_CLINIC_OR_DEPARTMENT_OTHER): Payer: Medicare Other

## 2022-11-28 VITALS — BP 154/58

## 2022-11-28 DIAGNOSIS — D469 Myelodysplastic syndrome, unspecified: Secondary | ICD-10-CM

## 2022-11-28 DIAGNOSIS — D461 Refractory anemia with ring sideroblasts: Secondary | ICD-10-CM | POA: Insufficient documentation

## 2022-11-28 LAB — HEMOGLOBIN AND HEMATOCRIT, BLOOD
HCT: 28.7 % — ABNORMAL LOW (ref 39.0–52.0)
Hemoglobin: 9.4 g/dL — ABNORMAL LOW (ref 13.0–17.0)

## 2022-11-28 MED ORDER — DARBEPOETIN ALFA 100 MCG/0.5ML IJ SOSY
100.0000 ug | PREFILLED_SYRINGE | Freq: Once | INTRAMUSCULAR | Status: AC
  Administered 2022-11-28: 100 ug via SUBCUTANEOUS
  Filled 2022-11-28: qty 0.5

## 2022-12-12 ENCOUNTER — Inpatient Hospital Stay (HOSPITAL_BASED_OUTPATIENT_CLINIC_OR_DEPARTMENT_OTHER): Payer: Medicare Other | Admitting: Internal Medicine

## 2022-12-12 ENCOUNTER — Encounter: Payer: Self-pay | Admitting: Internal Medicine

## 2022-12-12 ENCOUNTER — Inpatient Hospital Stay: Payer: Medicare Other

## 2022-12-12 VITALS — BP 164/79 | HR 53 | Temp 97.6°F | Resp 16 | Wt 204.5 lb

## 2022-12-12 DIAGNOSIS — D469 Myelodysplastic syndrome, unspecified: Secondary | ICD-10-CM

## 2022-12-12 DIAGNOSIS — D461 Refractory anemia with ring sideroblasts: Secondary | ICD-10-CM | POA: Diagnosis not present

## 2022-12-12 LAB — CBC WITH DIFFERENTIAL/PLATELET
Abs Immature Granulocytes: 0.06 10*3/uL (ref 0.00–0.07)
Basophils Absolute: 0 10*3/uL (ref 0.0–0.1)
Basophils Relative: 1 %
Eosinophils Absolute: 0.2 10*3/uL (ref 0.0–0.5)
Eosinophils Relative: 4 %
HCT: 29.5 % — ABNORMAL LOW (ref 39.0–52.0)
Hemoglobin: 9.5 g/dL — ABNORMAL LOW (ref 13.0–17.0)
Immature Granulocytes: 1 %
Lymphocytes Relative: 24 %
Lymphs Abs: 1.4 10*3/uL (ref 0.7–4.0)
MCH: 30.9 pg (ref 26.0–34.0)
MCHC: 32.2 g/dL (ref 30.0–36.0)
MCV: 96.1 fL (ref 80.0–100.0)
Monocytes Absolute: 0.4 10*3/uL (ref 0.1–1.0)
Monocytes Relative: 7 %
Neutro Abs: 3.6 10*3/uL (ref 1.7–7.7)
Neutrophils Relative %: 63 %
Platelets: 283 10*3/uL (ref 150–400)
RBC: 3.07 MIL/uL — ABNORMAL LOW (ref 4.22–5.81)
RDW: 27 % — ABNORMAL HIGH (ref 11.5–15.5)
WBC: 5.7 10*3/uL (ref 4.0–10.5)
nRBC: 0 % (ref 0.0–0.2)

## 2022-12-12 LAB — VITAMIN B12: Vitamin B-12: 2104 pg/mL — ABNORMAL HIGH (ref 180–914)

## 2022-12-12 LAB — FERRITIN: Ferritin: 973 ng/mL — ABNORMAL HIGH (ref 24–336)

## 2022-12-12 LAB — BASIC METABOLIC PANEL
Anion gap: 9 (ref 5–15)
BUN: 27 mg/dL — ABNORMAL HIGH (ref 8–23)
CO2: 25 mmol/L (ref 22–32)
Calcium: 9.4 mg/dL (ref 8.9–10.3)
Chloride: 105 mmol/L (ref 98–111)
Creatinine, Ser: 1.29 mg/dL — ABNORMAL HIGH (ref 0.61–1.24)
GFR, Estimated: 57 mL/min — ABNORMAL LOW (ref >60)
Glucose, Bld: 156 mg/dL — ABNORMAL HIGH (ref 70–99)
Potassium: 4.3 mmol/L (ref 3.5–5.1)
Sodium: 139 mmol/L (ref 135–145)

## 2022-12-12 LAB — IRON AND TIBC
Iron: 63 ug/dL (ref 45–182)
Saturation Ratios: 23 % (ref 17.9–39.5)
TIBC: 277 ug/dL (ref 250–450)
UIBC: 214 ug/dL

## 2022-12-12 MED ORDER — DARBEPOETIN ALFA 100 MCG/0.5ML IJ SOSY
100.0000 ug | PREFILLED_SYRINGE | Freq: Once | INTRAMUSCULAR | Status: AC
  Administered 2022-12-12: 100 ug via SUBCUTANEOUS
  Filled 2022-12-12: qty 0.5

## 2022-12-12 NOTE — Progress Notes (Signed)
Patient denies new problems/concerns today.   °

## 2022-12-12 NOTE — Assessment & Plan Note (Addendum)
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  Patient currently on Retacrit [since April 2021]; hemoglobin between 8-9.  Patient is fairly asymptomatic. stable  Continue Retacrit every 2 weeks.  # Discussed with the patient that if he gets more fatigue/symptomatic the options include Luspatercept/Revlimid.  #Today hemoglobin is 9.5- stable Continue Aranesp for now.  OCT 2023- B12 790; Ferritin-826  [recently started Fe/B12]  Okay to come off of oral iron.  # HTN- at home in 140s; BP- 160s- monitor closely.   # Intracranial bleed while on Eliquis/Hx of stroke s/p evacuation- [at home > 2 months]- OFF ELIQUIS; on asprin.  stable   # Hx Of A.fib [OFF eliquis secondary to intracranial bleed]- s/p watchman device- on asprin ONLY- stable  # Stage kidney disease-stage III [dr.Korrapati]-stable   # DISPOSITION:  # aranesp today # in 2 week- lab- H&H; possible aranesp # in 4 weeks- lab- H&H; possible aranesp # in 6 weeks- lab- H&H; possible aranesp # in 8 weeks- lab- H&H; possible aranesp # in 10 weeks- lab- H&H; possible aranesp # # in 12  weeks- MD; labs-cbc/bmp/ aranesp;;iron studies/ferritin; B12 levels-Dr.B

## 2022-12-12 NOTE — Progress Notes (Signed)
[place] NOTE  Patient Care Team: Baxter Hire, MD as PCP - General (Internal Medicine) Vickie Epley, MD as PCP - Electrophysiology (Cardiology) Cammie Sickle, MD as Consulting Physician (Hematology and Oncology)  CHIEF COMPLAINTS/PURPOSE OF CONSULTATION: MDS  Oncology History Overview Note  # EGD-none; colonoscopy-never [Cologurad x2- NEG];FEB 2021- US- abdomen-no liver disease/ Mild splenic enlargement [460cc; kidney cysts]; Z61 folic acid/LDH haptoglobin normal.  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    # NGS/MOLECULAR TESTS:[facility] ON hem- May 2021- Cux-1; DNMT3A; SF3B**  # PALLIATIVE CARE EVALUATION: NA   DIAGNOSIS: MDS low risk   GOALS: Control  CURRENT/MOST RECENT THERAPY : Retacrit    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[place])  02/18/2020 Initial Diagnosis   MDS (myelodysplastic syndrome), low grade (HCC)     HISTORY OF PRESENTING ILLNESS: Ambulating i in a wheelchair.  Accompanied by his wife.  Vernon [name] 77 y.o.  male with history of recently diagnosed low-grade MDS; A-fib on Eliquis currently on Retacrit is here for follow-up.  Patient denies new problems/concerns today.  Patient currently status post left atrial appendage occlusive device placement with 75m Watchman FLX device. on ASA '81mg'$  QD monotherapy. He is scheduled for repeat CT imaging 09/26/22 to ensure adequate device placement with no leak or device thrombosis.   Denies any worsening fatigue.  Denies any nausea vomiting abdominal pain.  No weight loss.  No worsening shortness of breath  or cough.  Review of Systems  Constitutional:  Positive for malaise/fatigue. Negative for chills, diaphoresis, fever and weight loss.  HENT:  Negative for nosebleeds and sore throat.   Eyes:  Negative for double vision.  Respiratory:  Negative for cough, hemoptysis, sputum production, shortness of breath and wheezing.   Cardiovascular:  Negative for chest pain, palpitations, orthopnea and leg swelling.  Gastrointestinal:  Negative for abdominal pain, blood in stool, constipation, diarrhea, heartburn, melena, nausea and vomiting.  Genitourinary:  Negative for dysuria, frequency and urgency.  Musculoskeletal:  Negative for back pain and joint pain.  Skin: Negative.  Negative for itching and rash.  Neurological:  Negative for dizziness, tingling, focal weakness, weakness and headaches.  Endo/Heme/Allergies:  Does not bruise/bleed easily.  Psychiatric/Behavioral:  Negative for depression. The patient is not nervous/anxious and does not have insomnia.     MEDICAL HISTORY:  Past Medical History:  Diagnosis Date   Anemia    Aortic stenosis    Arthritis    Complication of anesthesia    hard time getting bp up after knee replacement   Diabetes mellitus without complication (HCC)    GERD (gastroesophageal reflux disease)    occ tums prn   History of hiatal hernia    Hypertension    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    Presence of Watchman left atrial appendage closure device 07/26/2022   238mWatchman FLX with Dr. LaQuentin Ore  SURGICAL HISTORY: Past Surgical History:  Procedure Laterality Date   CARPAL TUNNEL RELEASE  2012   CRANIOTOMY N/A 02/10/2022   Procedure: SUBOCCIPITAL CRANIECTOMY FOR EVACUATION OF CEREBELLAR HEMATOMA;  Surgeon: ElKristeen MissMD;  [facility] Service: Neurosurgery;  Laterality: N/A;   JOINT REPLACEMENT Right 2010  LEFT ATRIAL APPENDAGE OCCLUSION N/A 07/26/2022   Procedure: LEFT ATRIAL APPENDAGE OCCLUSION;  Surgeon: Vickie Epley, MD;  Location: [facility];  Service: Cardiovascular;  Laterality: N/A;   SHOULDER ARTHROSCOPY WITH ROTATOR CUFF REPAIR AND OPEN BICEPS TENODESIS Right 11/09/2019   Procedure: RIGHT SHOULDER ARTHROSCOPY WITH SUBSCAPULARIS REPAIR, SUBACROMIAL DECOMPRESSION,MINI OPEN ROTATOR CUFF REPAIR;  Surgeon: Leim Fabry, MD;  Location: [facility];  Service: Orthopedics;  Laterality: Right;   TEE WITHOUT CARDIOVERSION N/A 07/26/2022   Procedure: TRANSESOPHAGEAL ECHOCARDIOGRAM (TEE);  Surgeon: Vickie Epley, MD;  Location: [facility];  Service: Cardiovascular;  Laterality: N/A;    SOCIAL HISTORY: Social History   Socioeconomic History   Marital status: Married    Spouse name: Not on file   Number of children: Not on file   Years of education: Not on file   Highest education level: Not on file  Occupational History   Not on file  Tobacco Use   Smoking status: Former    Packs/day: 0.50    Years: 8.00    Total pack years: 4.00    Types: Cigarettes    Quit date: 07/21/1978    Years since quitting: 44.4   Smokeless tobacco: Never  Vaping Use   Vaping Use: Never used  Substance and Sexual Activity   Alcohol use: Yes    Alcohol/week: 0.0 - 1.0 standard drinks of alcohol    Comment: rare beer   Drug use: Never   Sexual activity: Not on file  Other Topics Concern   Not on file  Social History Narrative   Lives in [place]; with wife; quit smoking in early 55s; ocassional/ rare [may be 1 a month beer]. 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    Social Determinants of Health   Financial Resource Strain: Not on file  Food Insecurity: Not on file  Transportation Needs: Not on file  Physical Activity: Not on file  Stress: Not on file  Social Connections: Not on file  Intimate Partner Violence: Not on file    FAMILY HISTORY: Family History  Problem Relation Age of Onset   Cancer Maternal Uncle     ALLERGIES:  has No Known Allergies.  MEDICATIONS:  Current Outpatient Medications   Medication Sig Dispense Refill   acetaminophen (TYLENOL) 650 MG CR tablet Take 650 mg by mouth every 8 (eight) hours as needed for pain.     aspirin EC 81 MG tablet Take 1 tablet (81 mg total) by mouth daily. Swallow whole. STOP ELIQUIS ON 10/15 AND START ASPIRIN 81 MG ON 10/16 90 tablet 3   metFORMIN (GLUCOPHAGE) 500 MG tablet Take 500 mg by mouth 2 (two) times daily with a meal.     Multiple Vitamin (MULTI-VITAMIN) tablet Take 1 tablet by mouth daily.     ondansetron (ZOFRAN-ODT) 4 MG disintegrating tablet Take 4 mg by mouth every 8 (eight) hours as needed.     polyethylene glycol powder (GLYCOLAX/MIRALAX) 17 GM/SCOOP powder Take by mouth.     Current Facility-Administered Medications  Medication Dose Route Frequency Provider Last Rate Last Admin   0.9 %  sodium chloride infusion   Intravenous Continuous Vickie Epley, MD       0.9 %  sodium chloride infusion   Intravenous Continuous Vickie Epley, MD          PHYSICAL EXAMINATION:   Vitals:   12/12/22 0900  BP: (!) 164/79  Pulse: (!) 53  Resp: 16  Temp: 97.6 F (36.4 C)  Filed Weights   12/12/22 0900  Weight: 204 lb 8 oz (92.8 kg)    Physical Exam HENT:     Head: Normocephalic and atraumatic.     Mouth/Throat:     Pharynx: No oropharyngeal exudate.  Eyes:     Pupils: Pupils are equal, round, and reactive to light.  Cardiovascular:     Rate and Rhythm: Normal rate and regular rhythm.  Pulmonary:     Effort: Pulmonary effort is normal. No respiratory distress.     Breath sounds: Normal breath sounds. No wheezing.  Abdominal:     General: Bowel sounds are normal. There is no distension.     Palpations: Abdomen is soft. There is no mass.     Tenderness: There is no abdominal tenderness. There is no guarding or rebound.  Musculoskeletal:        General: No tenderness. Normal range of motion.     Cervical back: Normal range of motion and neck supple.  Skin:    General: Skin is warm.  Neurological:      Mental Status: He is alert and oriented to person, place, and time.  Psychiatric:        Mood and Affect: Affect normal.    LABORATORY DATA:  I have reviewed the data as listed Lab Results  Component Value Date   WBC 5.7 12/12/2022   HGB 9.5 (L) 12/12/2022   HCT 29.5 (L) 12/12/2022   MCV 96.1 12/12/2022   PLT 283 12/12/2022   Recent Labs    02/20/22 0348 02/21/22 0117 02/22/22 0442 02/27/22 0520 03/12/22 0513 06/27/22 1412 07/23/22 1340 08/27/22 1332 09/19/22 0958 12/12/22 0919  NA 146* 142   < > 139   < > 139   < > 142 139 139  K 4.9 5.2*   < > 4.9   < > 4.3   < > 5.1 4.8 4.3  CL 113* 112*   < > 106   < > 108   < > 107* 109 105  CO2 25 25   < > 27   < > 24   < > '27 25 25  '$ GLUCOSE 144* 121*   < > 175*   < > 98   < > 117* 150* 156*  BUN 35* 35*   < > 41*   < > 28*   < > 25 34* 27*  CREATININE 1.36* 1.35*   < > 1.69*   < > 1.35*   < > 1.51* 1.44* 1.29*  CALCIUM 9.5 9.4   < > 9.0   < > 9.2   < > 9.7 9.4 9.4  GFRNONAA 54* 55*   < > 42*   < > 54*  --   --  50* 57*  PROT 6.5 6.7  --  6.6  --   --   --   --   --   --   ALBUMIN 3.3* 3.4*  --  3.2*  --   --   --   --   --   --   AST 45* 43*  --  22  --   --   --   --   --   --   ALT 31 33  --  24  --   --   --   --   --   --   ALKPHOS 132* 147*  --  162*  --   --   --   --   --   --  BILITOT 1.9* 1.7*  --  2.0*  --   --   --   --   --   --    < > = values in this interval not displayed.     No results found.  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  Patient currently on Retacrit [since April 2021]; hemoglobin between 8-9.  Patient is fairly asymptomatic. stable  Continue Retacrit every 2 weeks.  # Discussed with the patient that if he gets more fatigue/symptomatic the options include Luspatercept/Revlimid.  #Today hemoglobin is 9.5- stable Continue Aranesp for now.  OCT 2023- B12 790; Ferritin-826  [recently started Fe/B12]   Okay to come off of oral iron.  # HTN- at home in 140s; BP- 160s- monitor closely.   # Intracranial bleed while on Eliquis/Hx of stroke s/p evacuation- [at home > 2 months]- OFF ELIQUIS; on asprin.  stable   # Hx Of A.fib [OFF eliquis secondary to intracranial bleed]- s/p watchman device- on asprin ONLY- stable  # Stage kidney disease-stage III [dr.Korrapati]-stable   # DISPOSITION:  # aranesp today # in 2 week- lab- H&H; possible aranesp # in 4 weeks- lab- H&H; possible aranesp # in 6 weeks- lab- H&H; possible aranesp # in 8 weeks- lab- H&H; possible aranesp # in 10 weeks- lab- H&H; possible aranesp # # in 12  weeks- MD; labs-cbc/bmp/ aranesp;;iron studies/ferritin; B12 levels-Dr.B  All questions were answered. The patient knows to call the clinic with any problems, questions or concerns.    Cammie Sickle, MD 12/12/2022 10:44 AM

## 2022-12-17 ENCOUNTER — Inpatient Hospital Stay: Payer: Medicare Other

## 2022-12-17 ENCOUNTER — Encounter: Payer: Self-pay | Admitting: Emergency Medicine

## 2022-12-17 ENCOUNTER — Inpatient Hospital Stay
Admission: EM | Admit: 2022-12-17 | Discharge: 2022-12-20 | DRG: 442 | Disposition: A | Discharge status: Home / Self Care | Payer: Medicare Other | Attending: Internal Medicine | Admitting: Internal Medicine

## 2022-12-17 ENCOUNTER — Emergency Department: Payer: Medicare Other

## 2022-12-17 ENCOUNTER — Other Ambulatory Visit: Payer: Self-pay

## 2022-12-17 DIAGNOSIS — N1831 Chronic kidney disease, stage 3a: Secondary | ICD-10-CM | POA: Diagnosis present

## 2022-12-17 DIAGNOSIS — N179 Acute kidney failure, unspecified: Secondary | ICD-10-CM | POA: Diagnosis present

## 2022-12-17 DIAGNOSIS — Z7901 Long term (current) use of anticoagulants: Secondary | ICD-10-CM

## 2022-12-17 DIAGNOSIS — K219 Gastro-esophageal reflux disease without esophagitis: Secondary | ICD-10-CM | POA: Diagnosis present

## 2022-12-17 DIAGNOSIS — D469 Myelodysplastic syndrome, unspecified: Secondary | ICD-10-CM | POA: Diagnosis present

## 2022-12-17 DIAGNOSIS — Z7984 Long term (current) use of oral hypoglycemic drugs: Secondary | ICD-10-CM

## 2022-12-17 DIAGNOSIS — I129 Hypertensive chronic kidney disease with stage 1 through stage 4 chronic kidney disease, or unspecified chronic kidney disease: Secondary | ICD-10-CM | POA: Diagnosis present

## 2022-12-17 DIAGNOSIS — I480 Paroxysmal atrial fibrillation: Secondary | ICD-10-CM | POA: Diagnosis present

## 2022-12-17 DIAGNOSIS — Z7982 Long term (current) use of aspirin: Secondary | ICD-10-CM

## 2022-12-17 DIAGNOSIS — R7989 Other specified abnormal findings of blood chemistry: Secondary | ICD-10-CM | POA: Diagnosis not present

## 2022-12-17 DIAGNOSIS — R748 Abnormal levels of other serum enzymes: Secondary | ICD-10-CM | POA: Diagnosis present

## 2022-12-17 DIAGNOSIS — E782 Mixed hyperlipidemia: Secondary | ICD-10-CM | POA: Diagnosis present

## 2022-12-17 DIAGNOSIS — Z87891 Personal history of nicotine dependence: Secondary | ICD-10-CM

## 2022-12-17 DIAGNOSIS — E669 Obesity, unspecified: Secondary | ICD-10-CM | POA: Diagnosis present

## 2022-12-17 DIAGNOSIS — E785 Hyperlipidemia, unspecified: Secondary | ICD-10-CM | POA: Diagnosis present

## 2022-12-17 DIAGNOSIS — Z95818 Presence of other cardiac implants and grafts: Secondary | ICD-10-CM | POA: Diagnosis not present

## 2022-12-17 DIAGNOSIS — I482 Chronic atrial fibrillation, unspecified: Secondary | ICD-10-CM | POA: Diagnosis present

## 2022-12-17 DIAGNOSIS — I10 Essential (primary) hypertension: Secondary | ICD-10-CM | POA: Diagnosis present

## 2022-12-17 DIAGNOSIS — I350 Nonrheumatic aortic (valve) stenosis: Secondary | ICD-10-CM | POA: Diagnosis present

## 2022-12-17 DIAGNOSIS — E1165 Type 2 diabetes mellitus with hyperglycemia: Secondary | ICD-10-CM | POA: Diagnosis present

## 2022-12-17 DIAGNOSIS — K831 Obstruction of bile duct: Secondary | ICD-10-CM | POA: Diagnosis present

## 2022-12-17 DIAGNOSIS — R17 Unspecified jaundice: Principal | ICD-10-CM

## 2022-12-17 DIAGNOSIS — Z8673 Personal history of transient ischemic attack (TIA), and cerebral infarction without residual deficits: Secondary | ICD-10-CM | POA: Diagnosis not present

## 2022-12-17 DIAGNOSIS — Z79899 Other long term (current) drug therapy: Secondary | ICD-10-CM

## 2022-12-17 DIAGNOSIS — K7589 Other specified inflammatory liver diseases: Secondary | ICD-10-CM

## 2022-12-17 DIAGNOSIS — Z6830 Body mass index (BMI) 30.0-30.9, adult: Secondary | ICD-10-CM | POA: Diagnosis not present

## 2022-12-17 DIAGNOSIS — E1122 Type 2 diabetes mellitus with diabetic chronic kidney disease: Secondary | ICD-10-CM | POA: Diagnosis present

## 2022-12-17 DIAGNOSIS — D631 Anemia in chronic kidney disease: Secondary | ICD-10-CM | POA: Diagnosis present

## 2022-12-17 DIAGNOSIS — Z809 Family history of malignant neoplasm, unspecified: Secondary | ICD-10-CM

## 2022-12-17 DIAGNOSIS — N390 Urinary tract infection, site not specified: Secondary | ICD-10-CM

## 2022-12-17 DIAGNOSIS — B9620 Unspecified Escherichia coli [E. coli] as the cause of diseases classified elsewhere: Secondary | ICD-10-CM | POA: Diagnosis present

## 2022-12-17 LAB — COMPREHENSIVE METABOLIC PANEL
ALT: 105 U/L — ABNORMAL HIGH (ref 0–44)
AST: 102 U/L — ABNORMAL HIGH (ref 15–41)
Albumin: 4.4 g/dL (ref 3.5–5.0)
Alkaline Phosphatase: 284 U/L — ABNORMAL HIGH (ref 38–126)
Anion gap: 12 (ref 5–15)
BUN: 47 mg/dL — ABNORMAL HIGH (ref 8–23)
CO2: 23 mmol/L (ref 22–32)
Calcium: 9.3 mg/dL (ref 8.9–10.3)
Chloride: 100 mmol/L (ref 98–111)
Creatinine, Ser: 1.75 mg/dL — ABNORMAL HIGH (ref 0.61–1.24)
GFR, Estimated: 40 mL/min — ABNORMAL LOW (ref >60)
Glucose, Bld: 113 mg/dL — ABNORMAL HIGH (ref 70–99)
Potassium: 4.5 mmol/L (ref 3.5–5.1)
Sodium: 135 mmol/L (ref 135–145)
Total Bilirubin: 13.1 mg/dL — ABNORMAL HIGH (ref 0.3–1.2)
Total Protein: 8.3 g/dL — ABNORMAL HIGH (ref 6.5–8.1)

## 2022-12-17 LAB — CBC
HCT: 29.9 % — ABNORMAL LOW (ref 39.0–52.0)
Hemoglobin: 9.7 g/dL — ABNORMAL LOW (ref 13.0–17.0)
MCH: 30.9 pg (ref 26.0–34.0)
MCHC: 32.4 g/dL (ref 30.0–36.0)
MCV: 95.2 fL (ref 80.0–100.0)
Platelets: 246 10*3/uL (ref 150–400)
RBC: 3.14 MIL/uL — ABNORMAL LOW (ref 4.22–5.81)
RDW: 27.2 % — ABNORMAL HIGH (ref 11.5–15.5)
WBC: 13.7 10*3/uL — ABNORMAL HIGH (ref 4.0–10.5)
nRBC: 0 % (ref 0.0–0.2)

## 2022-12-17 LAB — LIPASE, BLOOD: Lipase: 54 U/L — ABNORMAL HIGH (ref 11–51)

## 2022-12-17 LAB — BILIRUBIN, DIRECT: Bilirubin, Direct: 7.1 mg/dL — ABNORMAL HIGH (ref 0.0–0.2)

## 2022-12-17 LAB — APTT: aPTT: 40 seconds — ABNORMAL HIGH (ref 24–36)

## 2022-12-17 LAB — PROTIME-INR
INR: 1.3 — ABNORMAL HIGH (ref 0.8–1.2)
Prothrombin Time: 16.3 seconds — ABNORMAL HIGH (ref 11.4–15.2)

## 2022-12-17 MED ORDER — IOHEXOL 300 MG/ML  SOLN
100.0000 mL | Freq: Once | INTRAMUSCULAR | Status: AC | PRN
  Administered 2022-12-17: 100 mL via INTRAVENOUS

## 2022-12-17 MED ORDER — LORAZEPAM 0.5 MG PO TABS
0.5000 mg | ORAL_TABLET | Freq: Once | ORAL | Status: AC
  Administered 2022-12-17: 0.5 mg via ORAL
  Filled 2022-12-17: qty 1

## 2022-12-17 MED ORDER — SODIUM CHLORIDE 0.9 % IV SOLN
1.0000 g | Freq: Once | INTRAVENOUS | Status: AC
  Administered 2022-12-17: 1 g via INTRAVENOUS
  Filled 2022-12-17: qty 10

## 2022-12-17 MED ORDER — SODIUM CHLORIDE 0.9 % IV BOLUS
500.0000 mL | Freq: Once | INTRAVENOUS | Status: AC
  Administered 2022-12-17: 500 mL via INTRAVENOUS

## 2022-12-17 NOTE — ED Provider Notes (Signed)
[facility] Provider Note    Event Date/Time   First MD Initiated Contact with Patient 12/17/22 1918     (approximate)   History   Jaundice   HPI  Vernon [name] is a 77 y.o. male history of diabetes, GERD, A-fib not currently on anticoagulation presents to the ER for evaluation of jaundice from [facility].  Found to have bilirubin greater than 10.  States he has had some chills and fevers over the past few days denies any pain.  Denies any new medications.  Does not drink alcohol.  Denies any new medications.     Physical Exam   Triage Vital Signs: ED Triage Vitals  Enc Vitals Group     BP 12/17/22 1636 (!) 131/109     Pulse Rate 12/17/22 1636 64     Resp 12/17/22 1636 18     Temp 12/17/22 1636 98.2 F (36.8 C)     Temp Source 12/17/22 1636 Oral     SpO2 12/17/22 1636 96 %     Weight --      Height --      Head Circumference --      Peak Flow --      Pain Score 12/17/22 1637 0     Pain Loc --      Pain Edu? --      Excl. in [place]? --     Most recent vital signs: Vitals:   12/17/22 1636 12/17/22 2133  BP: (!) 131/109 (!) 141/68  Pulse: 64 65  Resp: 18 17  Temp: 98.2 F (36.8 C) 97.6 F (36.4 C)  SpO2: 96% 96%     Constitutional: Alert  Eyes: Conjunctivae are normal. jaundice Head: Atraumatic. Nose: No congestion/rhinnorhea. Mouth/Throat: Mucous membranes are moist.   Neck: Painless ROM.  Cardiovascular:   Good peripheral circulation. Respiratory: Normal respiratory effort.  No retractions.  Gastrointestinal: Soft and nontender on deep palpation in all four quadrants Musculoskeletal:  no deformity Neurologic:  MAE spontaneously. No gross focal neurologic deficits are appreciated.  Skin:  Skin is warm, dry and intact. No rash noted. Psychiatric: Mood and affect are normal. Speech and behavior are normal.    ED Results / Procedures / Treatments   Labs (all labs ordered are listed, but only abnormal results are  displayed) Labs Reviewed  CBC - Abnormal; Notable for the following components:      Result Value   WBC 13.7 (*)    RBC 3.14 (*)    Hemoglobin 9.7 (*)    HCT 29.9 (*)    RDW 27.2 (*)    All other components within normal limits  COMPREHENSIVE METABOLIC PANEL - Abnormal; Notable for the following components:   Glucose, Bld 113 (*)    BUN 47 (*)    Creatinine, Ser 1.75 (*)    Total Protein 8.3 (*)    AST 102 (*)    ALT 105 (*)    Alkaline Phosphatase 284 (*)    Total Bilirubin 13.1 (*)    GFR, Estimated 40 (*)    All other components within normal limits  PROTIME-INR - Abnormal; Notable for the following components:   Prothrombin Time 16.3 (*)    INR 1.3 (*)    All other components within normal limits  APTT - Abnormal; Notable for the following components:   aPTT 40 (*)    All other components within normal limits  BILIRUBIN, DIRECT - Abnormal; Notable for the following components:   Bilirubin,  Direct 7.1 (*)    All other components within normal limits  LIPASE, BLOOD - Abnormal; Notable for the following components:   Lipase 54 (*)    All other components within normal limits  HEPATITIS PANEL, ACUTE     EKG     RADIOLOGY Please see ED Course for my review and interpretation.  I personally reviewed all radiographic images ordered to evaluate for the above acute complaints and reviewed radiology reports and findings.  These findings were personally discussed with the patient.  Please see medical record for radiology report.    PROCEDURES:  Critical Care performed: No  Procedures   MEDICATIONS ORDERED IN ED: Medications  cefTRIAXone (ROCEPHIN) 1 g in sodium chloride 0.9 % 100 mL IVPB (has no administration in time range)  iohexol (OMNIPAQUE) 300 MG/ML solution 100 mL (100 mLs Intravenous Contrast Given 12/17/22 2106)  sodium chloride 0.9 % bolus 500 mL (500 mLs Intravenous New Bag/Given 12/17/22 2102)     IMPRESSION / MDM / ASSESSMENT AND PLAN / ED COURSE   I reviewed the triage vital signs and the nursing notes.                              Differential diagnosis includes, but is not limited to, hepatitis, cholangitis, cholelithiasis, mass, stone, stricture, hemolysis,  Patient presenting to the ER for evaluation of symptoms as described above.  Based on symptoms, risk factors and considered above differential, this presenting complaint could reflect a potentially life-threatening illness therefore the patient will be placed on continuous pulse oximetry and telemetry for monitoring.  Laboratory evaluation will be sent to evaluate for the above complaints.      Clinical Course as of 12/17/22 2204  Mon Dec 17, 2022  1956 RUQ [place] on my review and interpretation with out evidence of gallstones. [PR]  2036 Will order CT. [PR]  2139 CT imaging with evidence of possible inflammatory process in the pancreas. [PR]  2148 Patient reassessed.  He denies any pain.  Denies any back pain.  Urinalysis does show many bacteria urinalysis.  He is not showing signs of hemolysis.  Sign of atypical presentation.  Will give IV Rocephin given UTI.  Will consult hospitalist for admission given findings concerning for painless jaundice. [PR]    Clinical Course User Index [PR] Merlyn Lot, MD     FINAL CLINICAL IMPRESSION(S) / ED DIAGNOSES   Final diagnoses:  Painless jaundice     Rx / DC Orders   ED Discharge Orders     None        Note:  This document was prepared using Dragon voice recognition software and may include unintentional dictation errors.    Merlyn Lot, MD 12/17/22 2204

## 2022-12-17 NOTE — ED Triage Notes (Signed)
Patient to ED form [facility] for jaundice. Patient sent over for elevated liver enzymes and elevated white count. Also, concerned for dehydration and states he has a UTI.\  States just got blood drawn at [facility].

## 2022-12-17 NOTE — H&P (Signed)
History and Physical    Patient: Vernon [name] CHY:[id number] DOB: 1946/10/12 DOA: 12/17/2022 DOS: the patient was seen and examined on 12/17/2022 PCP: Baxter Hire, MD  Patient coming from: Home  Chief Complaint:  Chief Complaint  Patient presents with   Jaundice   HPI: Vernon [name] is a 77 y.o. male with medical history significant of anemia, GERD, essential hypertension, myelodysplastic disease, history of watchman procedure, aortic stenosis, type 2 diabetes, paroxysmal atrial fibrillation who presents to the ER for evaluation of jaundice from the [place] clinic.  He was seen in the ER today and found to have a bilirubin more than 10.  Patient has had some fever and chills recently.  No pain at all.  Patient does not drink alcohol.  Denies any medications.  Patient was seen and evaluated.  Suspected to have acute pancreatitis versus pancreatic cancer.  He has elevated lipase.  Other LFTs elevated.  CT abdomen pelvis showed peripancreatic inflammation suspicious for possible acute pancreatitis.  Review of Systems: As mentioned in the history of present illness. All other systems reviewed and are negative. 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;  Surgeon: EKristeen Miss MD;  [facility]  Service: Neurosurgery;  Laterality: N/A;   JOINT REPLACEMENT Right 2010   LEFT ATRIAL  APPENDAGE OCCLUSION N/A 07/26/2022   Procedure: LEFT ATRIAL APPENDAGE OCCLUSION;  Surgeon: LVickie Epley MD;  [facility];  Service: Cardiovascular;  Laterality: N/A;   SHOULDER ARTHROSCOPY WITH ROTATOR CUFF REPAIR AND OPEN BICEPS TENODESIS Right 11/09/2019   Procedure: RIGHT SHOULDER ARTHROSCOPY WITH SUBSCAPULARIS REPAIR, SUBACROMIAL DECOMPRESSION,MINI OPEN ROTATOR CUFF REPAIR;  Surgeon: PLeim Fabry MD;  Location: [facility];  Service: Orthopedics;  Laterality: Right;   TEE WITHOUT CARDIOVERSION N/A 07/26/2022   Procedure: TRANSESOPHAGEAL ECHOCARDIOGRAM (TEE);  Surgeon: LVickie Epley MD;  [facility];  Service: Cardiovascular;  Laterality: N/A;   Social History:  reports that he quit smoking about 44 years ago. His smoking use included cigarettes. He has a 4.00 pack-year smoking history. He has never used smokeless tobacco. He reports current alcohol use. He reports that he does not use drugs.  No Known Allergies  Family History  Problem Relation Age of Onset   Cancer Maternal Uncle     Prior to Admission medications   Medication Sig Start Date End Date Taking? Authorizing Provider  acetaminophen (TYLENOL) 650 MG CR tablet Take 650 mg by mouth every 8 (eight) hours as needed for pain.    [provider]  aspirin EC 81 MG tablet Take 1 tablet (81 mg total) by mouth daily. Swallow whole. STOP ELIQUIS ON 10/15 AND START ASPIRIN 81 MG ON 10/16 08/27/22   MKathyrn DrownD, NP  metFORMIN (GLUCOPHAGE) 500 MG tablet Take 500 mg by mouth 2 (two) times  daily with a meal.    [provider]  Multiple Vitamin (MULTI-VITAMIN) tablet Take 1 tablet by mouth daily.    [provider]  ondansetron (ZOFRAN-ODT) 4 MG disintegrating tablet Take 4 mg by mouth every 8 (eight) hours as needed. 11/12/22   [provider]  polyethylene glycol powder (GLYCOLAX/MIRALAX) 17 GM/SCOOP powder Take by mouth. 03/20/22   [provider]     Physical Exam: Vitals:   12/17/22 1636 12/17/22 2133  BP: (!) 131/109 (!) 141/68  Pulse: 64 65  Resp: 18 17  Temp: 98.2 F (36.8 C) 97.6 F (36.4 C)  TempSrc: Oral Oral  SpO2: 96% 96%  Weight:  92.5 kg  Height:  '5\' 9"'$  (1.753 m)   Constitutional: NAD, calm, comfortable Eyes: PERRL, lids and conjunctivae jaundiced ENMT: Mucous membranes are moist. Posterior pharynx clear of any exudate or lesions.Normal dentition.  Neck: normal, supple, no masses, no thyromegaly Respiratory: clear to auscultation bilaterally, no wheezing, no crackles. Normal respiratory effort. No accessory muscle use.  Cardiovascular: Regular rate and rhythm, no murmurs / rubs / gallops. No extremity edema. 2+ pedal pulses. No carotid bruits.  Abdomen: no tenderness, no masses palpated. No hepatosplenomegaly. Bowel sounds positive.  Musculoskeletal: Good range of motion, no joint swelling or tenderness, Skin: no rashes, lesions, ulcers. No induration Neurologic: CN 2-12 grossly intact. Sensation intact, DTR normal. Strength 5/5 in all 4.  Psychiatric: Normal judgment and insight. Alert and oriented x 3. Normal mood  Data Reviewed:  Glucose113 BUN 47 creatinine 1.75, alkaline phosphatase 284, albumin 4.4, lipase 54, AST 102 ALT 105, total protein 8.3, total bilirubin 13.1 with direct 7.1.  White count 13.7 hemoglobin 9.7.  PT 16.3 INR 1.3, CT abdomen and pelvis with contrast showed trace inflammatory changes surrounding the uncinate process of the pancreas compatible with acute pancreatitis but no evidence for pancreatic necrosis.  Small amount of free fluid in the pelvis and small pericardial effusion.  Multiple renal cysts.  Ultrasound of the abdomen shows sludge in the gallbladder but no evidence of acute cholecystitis.  Also right-sided pleural effusion.  Assessment and Plan:  #1 obstructive jaundice: Cause is unclear.  CT is suggestive of acute pancreatitis but patient has no evidence of pancreatitis with the  pain.  Elevated alkaline phosphatase but not significant increase in AST or ALT.  Not alcoholic.  Acute hepatitis panel pending.  MRCP also pending.  GI consulted.  #2 leukocytosis: Cause unclear.  Continue to monitor  #3 normocytic anemia: Possibly acute illness.  Continue to monitor  #4 AKI on CKD 3: Continue to monitor  #5 chronic atrial fibrillation: Patient has been on anticoagulation but does not appear to be on one right now.  Has had a watchman's procedure.  Continue to monitor  #6 diabetes: non-insulin-dependent.  Continue sliding scale insulin.  #7 hyperlipidemia: Statin  #8 essential hypertension: Confirm and resume home regimen    Advance Care Planning:   Code Status: Full Code   Consults: Gastroenterologist Dr. Allen Norris  Family Communication: Wife at bedside  Severity of Illness: The appropriate patient status for this patient is INPATIENT. Inpatient status is judged to be reasonable and necessary in order to provide the required intensity of service to ensure the patient's safety. The patient's presenting symptoms, physical exam findings, and initial radiographic and laboratory data in the context of their chronic comorbidities is felt to place them at high risk for further clinical deterioration. Furthermore, it is not anticipated that the patient will be medically stable for  discharge from the hospital within 2 midnights of admission.   * I certify that at the point of admission it is my clinical judgment that the patient will require inpatient hospital care spanning beyond 2 midnights from the point of admission due to high intensity of service, high risk for further deterioration and high frequency of surveillance required.*  AuthorBarbette Merino, MD 12/17/2022 10:05 PM  For on call review [email or web address].

## 2022-12-18 DIAGNOSIS — Z95818 Presence of other cardiac implants and grafts: Secondary | ICD-10-CM | POA: Diagnosis not present

## 2022-12-18 DIAGNOSIS — I482 Chronic atrial fibrillation, unspecified: Secondary | ICD-10-CM

## 2022-12-18 DIAGNOSIS — R17 Unspecified jaundice: Secondary | ICD-10-CM | POA: Diagnosis not present

## 2022-12-18 DIAGNOSIS — K7589 Other specified inflammatory liver diseases: Secondary | ICD-10-CM | POA: Diagnosis not present

## 2022-12-18 DIAGNOSIS — R7989 Other specified abnormal findings of blood chemistry: Secondary | ICD-10-CM

## 2022-12-18 DIAGNOSIS — K831 Obstruction of bile duct: Secondary | ICD-10-CM | POA: Diagnosis not present

## 2022-12-18 LAB — CBC
HCT: 27 % — ABNORMAL LOW (ref 39.0–52.0)
Hemoglobin: 8.7 g/dL — ABNORMAL LOW (ref 13.0–17.0)
MCH: 30.7 pg (ref 26.0–34.0)
MCHC: 32.2 g/dL (ref 30.0–36.0)
MCV: 95.4 fL (ref 80.0–100.0)
Platelets: 226 10*3/uL (ref 150–400)
RBC: 2.83 MIL/uL — ABNORMAL LOW (ref 4.22–5.81)
RDW: 27.6 % — ABNORMAL HIGH (ref 11.5–15.5)
WBC: 10.1 10*3/uL (ref 4.0–10.5)
nRBC: 0 % (ref 0.0–0.2)

## 2022-12-18 LAB — COMPREHENSIVE METABOLIC PANEL
ALT: 86 U/L — ABNORMAL HIGH (ref 0–44)
AST: 72 U/L — ABNORMAL HIGH (ref 15–41)
Albumin: 3.8 g/dL (ref 3.5–5.0)
Alkaline Phosphatase: 263 U/L — ABNORMAL HIGH (ref 38–126)
Anion gap: 12 (ref 5–15)
BUN: 45 mg/dL — ABNORMAL HIGH (ref 8–23)
CO2: 21 mmol/L — ABNORMAL LOW (ref 22–32)
Calcium: 8.6 mg/dL — ABNORMAL LOW (ref 8.9–10.3)
Chloride: 105 mmol/L (ref 98–111)
Creatinine, Ser: 1.69 mg/dL — ABNORMAL HIGH (ref 0.61–1.24)
GFR, Estimated: 42 mL/min — ABNORMAL LOW (ref >60)
Glucose, Bld: 155 mg/dL — ABNORMAL HIGH (ref 70–99)
Potassium: 4.3 mmol/L (ref 3.5–5.1)
Sodium: 138 mmol/L (ref 135–145)
Total Bilirubin: 10.7 mg/dL — ABNORMAL HIGH (ref 0.3–1.2)
Total Protein: 7.4 g/dL (ref 6.5–8.1)

## 2022-12-18 LAB — HEMOGLOBIN A1C
Hgb A1c MFr Bld: 5.7 % — ABNORMAL HIGH (ref 4.8–5.6)
Mean Plasma Glucose: 116.89 mg/dL

## 2022-12-18 LAB — CREATININE, SERUM
Creatinine, Ser: 1.75 mg/dL — ABNORMAL HIGH (ref 0.61–1.24)
GFR, Estimated: 40 mL/min — ABNORMAL LOW (ref >60)

## 2022-12-18 LAB — HEPATITIS PANEL, ACUTE
HCV Ab: NONREACTIVE
Hep A IgM: NONREACTIVE
Hep B C IgM: NONREACTIVE
Hepatitis B Surface Ag: NONREACTIVE

## 2022-12-18 LAB — CBG MONITORING, ED: Glucose-Capillary: 118 mg/dL — ABNORMAL HIGH (ref 70–99)

## 2022-12-18 LAB — GLUCOSE, CAPILLARY
Glucose-Capillary: 125 mg/dL — ABNORMAL HIGH (ref 70–99)
Glucose-Capillary: 116 mg/dL — ABNORMAL HIGH (ref 70–99)

## 2022-12-18 MED ORDER — INSULIN ASPART 100 UNIT/ML IJ SOLN
0.0000 [IU] | Freq: Three times a day (TID) | INTRAMUSCULAR | Status: DC
  Filled 2022-12-18: qty 1

## 2022-12-18 MED ORDER — ONDANSETRON HCL 4 MG/2ML IJ SOLN: 4.0000 mg | Freq: Four times a day (QID) | INTRAMUSCULAR | Status: DC | PRN

## 2022-12-18 MED ORDER — ONDANSETRON HCL 4 MG PO TABS: 4.0000 mg | ORAL_TABLET | Freq: Four times a day (QID) | ORAL | Status: DC | PRN

## 2022-12-18 MED ORDER — SODIUM CHLORIDE 0.9 % IV SOLN: INTRAVENOUS | Status: DC

## 2022-12-18 MED ORDER — HEPARIN SODIUM (PORCINE) 5000 UNIT/ML IJ SOLN
5000.0000 [IU] | Freq: Three times a day (TID) | INTRAMUSCULAR | Status: DC
  Administered 2022-12-18: 5000 [IU] via SUBCUTANEOUS
  Filled 2022-12-18 (×2): qty 1

## 2022-12-18 MED ORDER — HYDROMORPHONE HCL 1 MG/ML IJ SOLN: 1.0000 mg | INTRAMUSCULAR | Status: DC | PRN

## 2022-12-18 MED ORDER — INSULIN ASPART 100 UNIT/ML IJ SOLN: 0.0000 [IU] | Freq: Every day | INTRAMUSCULAR | Status: DC

## 2022-12-18 MED ORDER — SODIUM CHLORIDE 0.9 % IV SOLN
1.0000 g | INTRAVENOUS | Status: DC
  Administered 2022-12-18 – 2022-12-19 (×2): 1 g via INTRAVENOUS
  Filled 2022-12-18 (×2): qty 10
  Filled 2022-12-18: qty 1

## 2022-12-18 NOTE — Consult Note (Addendum)
Cephas Darby, MD [address]  [place]  [place], [place] [address]  Main: [phone]  Fax: [phone] Pager: [phone]   Consultation  Referring Provider:     No ref. provider found Primary Care Physician:  Baxter Hire, MD Primary Gastroenterologist: Althia Forts       Reason for Consultation: Jaundice, elevated LFTs  Date of Admission:  12/17/2022 Date of Consultation:  12/18/2022         HPI:   Vernon [name] is a 77 y.o. 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  Patient was seen by [facility] gastroenterology yesterday secondary to 2 to 3 weeks history of abdominal discomfort associated with fever, chills, lethargy and jaundice.  Labs as outpatient revealed abnormal UA concerning for UTI, neutrophilic leukocytosis, WBC count 22.3, BUN/creatinine 43/2.2, AST 110, ALT 108, alkaline phosphatase 352, total bilirubin 11.7.  Therefore, patient is advised to go to the emergency room for evaluation.  Patient denies any alcohol use, denies taking any new medication or medications, Tylenol use.  Patient is accompanied by his wife today. Independent of ADLs and very active.  Patient reports that he had a stroke in March 2023, had bad UTI at that time.  He had mildly elevated serum lipase underwent CT abdomen pelvis which revealed peripancreatic inflammation suspicious for acute pancreatitis.  He had abdominal ultrasound which revealed sludge in the gallbladder, no sonographic evidence of acute cholecystitis.  Also, underwent MRCP with several findings including nodular appearance of the liver suggestive of cirrhosis, with no evidence of portal hypertension presence of biliary sludge with imaging findings of acute cholecystitis.  Patient had several small pancreatic lesions that were  incompletely characterized on noncontrast MRI as well as numerous hyperintensities scattered throughout the spleen on noncontrast MRI as well as multiple renal lesions noncontrast MRI.  Review of his labs revealed mildly elevated LFTs since 01/2022, AST was 45, patient had isolated total bilirubin mildly elevated since 01/2016.  Initial elevation of alkaline phosphatase was noted in 12/2020 which was 119 along with T. bili 3.0.  He had mildly elevated alkaline phosphatase along with T. bili in 04/2022.  Total abdominal ultrasound in 2021 for workup of macrocytic anemia revealed normal appearing liver and spleen  Patient is currently being treated for UTI with ceftriaxone, leukocytosis has resolved AKI slowly improving on maintenance IV fluids.  LFTs are also trending down.  Patient denies any abdominal pain, nausea or vomiting, has been tolerating diet well.  He has chronic anemia serum ferritin levels have been chronically elevated to more than 500 and iron saturation was more than 50 in the past  Acute viral hepatitis panel negative  Patient denies any family history of chronic liver disease or liver malignancy  NSAIDs: None  Antiplts/Anticoagulants/Anti thrombotics: None  GI Procedures: Unknown  Past Medical History:  Diagnosis Date   Anemia    Aortic stenosis    Arthritis    Complication of anesthesia    hard time getting bp up after knee replacement   Diabetes mellitus without complication (HCC)    GERD (gastroesophageal reflux disease)    occ tums prn   History of hiatal hernia    Hypertension    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[facility])    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;  Surgeon: Kristeen Miss, MD;  Location: [facility];   Service: Neurosurgery;  Laterality: N/A;   JOINT REPLACEMENT Right 2010   LEFT ATRIAL APPENDAGE OCCLUSION N/A 07/26/2022   Procedure: LEFT ATRIAL APPENDAGE OCCLUSION;  Surgeon: Vickie Epley, MD;  Location: [facility];  Service: Cardiovascular;  Laterality: N/A;   SHOULDER ARTHROSCOPY WITH ROTATOR CUFF REPAIR AND OPEN BICEPS TENODESIS Right 11/09/2019   Procedure: RIGHT SHOULDER ARTHROSCOPY WITH SUBSCAPULARIS REPAIR, SUBACROMIAL DECOMPRESSION,MINI OPEN ROTATOR CUFF REPAIR;  Surgeon: Leim Fabry, MD;  Location: [facility];  Service: Orthopedics;  Laterality: Right;   TEE WITHOUT CARDIOVERSION N/A 07/26/2022   Procedure: TRANSESOPHAGEAL ECHOCARDIOGRAM (TEE);  Surgeon: Vickie Epley, MD;  Location: [facility];  Service: Cardiovascular;  Laterality: N/A;     Current Facility-Administered Medications:    0.9 %  sodium chloride infusion, , Intravenous, Continuous, Alekh, Kshitiz, MD, Last Rate: 100 mL/hr at 12/18/22 1801, Infusion Verify at 12/18/22 1801   cefTRIAXone (ROCEPHIN) 1 g in sodium chloride 0.9 % 100 mL IVPB, 1 g, Intravenous, Q24H, Alekh, Kshitiz, MD, Stopped at 12/18/22 1329   heparin injection 5,000 Units, 5,000 Units, Subcutaneous, Q8H, Gala Romney L, MD, 5,000 Units at 12/18/22 0659   HYDROmorphone (DILAUDID) injection 1 mg, 1 mg, Intravenous, Q2H PRN, Garba, Mohammad L, MD   insulin aspart (novoLOG) injection 0-15 Units, 0-15 Units, Subcutaneous, TID WC, Garba, Mohammad L, MD   insulin aspart (novoLOG) injection 0-5 Units, 0-5 Units, Subcutaneous, QHS, Garba, Mohammad L, MD   ondansetron (ZOFRAN) tablet 4 mg, 4 mg, Oral, Q6H PRN **OR** ondansetron (ZOFRAN) injection 4 mg, 4 mg, Intravenous, Q6H PRN, Elwyn Reach, MD   Family History  Problem Relation Age of Onset   Cancer Maternal Uncle      Social History   Tobacco Use   Smoking status: Former    Packs/day: 0.50    Years: 8.00    Total pack years: 4.00    Types: Cigarettes    Quit date:  07/21/1978    Years since quitting: 44.4   Smokeless tobacco: Never  Vaping Use   Vaping Use: Never used  Substance Use Topics   Alcohol use: Yes    Alcohol/week: 0.0 - 1.0 standard drinks of alcohol    Comment: rare beer   Drug use: Never    Allergies as of 12/17/2022   (No Known Allergies)    Review of Systems:    All systems reviewed and negative except where noted in HPI.   Physical Exam:  Vital signs in last 24 hours: Temp:  [97.7 F (36.5 C)-98.6 F (37 C)] 98.4 F (36.9 C) (01/23 2224) Pulse Rate:  [64-77] 70 (01/23 2224) Resp:  [16-20] 20 (01/23 2224) BP: (141-156)/(64-90) 156/90 (01/23 2224) SpO2:  [96 %-99 %] 96 % (01/23 2224) Last BM Date : 12/18/22 General:   Pleasant, cooperative in NAD Head:  Normocephalic and atraumatic. Eyes:   Severe icterus.   Conjunctiva yellow. PERRLA. Ears:  Normal auditory acuity. Neck:  Supple; no masses or thyroidomegaly Lungs: Respirations even and unlabored. Lungs clear to auscultation bilaterally.   No wheezes, crackles, or rhonchi.  Heart:  Regular rate and rhythm;  Without murmur, clicks, rubs or gallops Abdomen:  Soft, nondistended, nontender. Normal bowel sounds. No appreciable masses or hepatomegaly.  No rebound or guarding.  Rectal:  Not performed. Msk:  Symmetrical without gross deformities.  Strength generalized weakness Extremities:  Without edema, cyanosis or clubbing. Neurologic:  Alert and oriented x3;  grossly normal neurologically. Skin:  Intact without significant lesions or rashes, yellow skin Psych:  Alert and cooperative. Normal affect.  LAB RESULTS:    Latest Ref Rng & Units 12/18/2022    5:56 AM 12/17/2022    8:41 PM 12/12/2022    9:19 AM  CBC  WBC 4.0 - 10.5 K/uL 10.1  13.7  5.7   Hemoglobin 13.0 - 17.0 g/dL 8.7  9.7  9.5   Hematocrit 39.0 - 52.0 % 27.0  29.9  29.5   Platelets 150 - 400 K/uL 226  246  283     BMET    Latest Ref Rng & Units 12/18/2022   10:20 AM 12/18/2022    5:56 AM 12/17/2022     8:41 PM  BMP  Glucose 70 - 99 mg/dL 155   113   BUN 8 - 23 mg/dL 45   47   Creatinine 0.61 - 1.24 mg/dL 1.69  1.75  1.75   Sodium 135 - 145 mmol/L 138   135   Potassium 3.5 - 5.1 mmol/L 4.3   4.5   Chloride 98 - 111 mmol/L 105   100   CO2 22 - 32 mmol/L 21   23   Calcium 8.9 - 10.3 mg/dL 8.6   9.3     LFT    Latest Ref Rng & Units 12/18/2022   10:20 AM 12/17/2022    8:41 PM 02/27/2022    5:20 AM  Hepatic Function  Total Protein 6.5 - 8.1 g/dL 7.4  8.3  6.6   Albumin 3.5 - 5.0 g/dL 3.8  4.4  3.2   AST 15 - 41 U/L 72  102  22   ALT 0 - 44 U/L 86  105  24   Alk Phosphatase 38 - 126 U/L 263  284  162   Total Bilirubin 0.3 - 1.2 mg/dL 10.7  13.1  2.0   Bilirubin, Direct 0.0 - 0.2 mg/dL  7.1       STUDIES: MR ABDOMEN MRCP WO CONTRAST  Result Date: 12/18/2022 CLINICAL DATA:  77 year old male with history of jaundice. EXAM: MRI ABDOMEN WITHOUT CONTRAST  (INCLUDING MRCP) TECHNIQUE: Multiplanar multisequence MR imaging of the abdomen was performed. Heavily T2-weighted images of the biliary and pancreatic ducts were obtained, and three-dimensional MRCP images were rendered by post processing. COMPARISON:  No prior abdominal MRI. CT of the abdomen and pelvis 12/17/2022. FINDINGS: Comment: Today's study is limited for detection and characterization of visceral and/or vascular lesions by lack of IV gadolinium. Lower chest: Unremarkable. Hepatobiliary: Liver has a nodular contour, suggesting underlying cirrhosis. No discrete cystic or solid hepatic lesions are confidently identified on today's noncontrast examination. There is some amorphous material lying dependently in the neck of the gallbladder, along with some tiny well-defined filling defects in this region, compatible with a combination of biliary sludge and tiny gallstones. Gallbladder is moderately distended. Gallbladder wall thickness appears normal. No gallbladder wall edema. No pericholecystic fluid or surrounding inflammatory changes. No  intra or extrahepatic biliary ductal dilatation noted on MRCP images. Common bile duct measures only 4 mm in the porta hepatis. No definite filling defects within the common bile duct to suggest choledocholithiasis. Pancreas: Several small T1 hypointense, T2 hyperintense lesions are noted in the pancreas, incompletely characterized on today's noncontrast examination, but likely to represent tiny pancreatic pseudocysts or side  branch IPMN (intraductal papillary mucinous neoplasm). The largest of these is in the body of the pancreas (axial image 18 of series 4 and coronal image 24 of series 3) measuring 1.4 x 0.7 x 1.2 cm. Direct communication with the main pancreatic duct can not be confirmed with any of these lesions. Main pancreatic duct is not dilated. No definite solid appearing pancreatic mass confidently identified on today's noncontrast examination. No peripancreatic fluid collections. Spleen: Numerous small T2 hyperintensities are scattered throughout the spleen, nonspecific in incompletely characterized on today's noncontrast examination. Spleen appears mildly enlarged measuring 13.1 x 14.5 x 5.8 cm (estimated splenic volume of 551 mL) . Adrenals/Urinary Tract: Numerous renal lesions bilaterally, incompletely characterized on today's noncontrast examination. The majority of these are T1 hypointense and T2 hyperintense, likely large cysts, measuring up to 9.5 cm extending exophytically from the lower pole of the right kidney. Other lesions demonstrate variable degrees of T1 hyperintensity, some with T2 hypointensity, potentially proteinaceous and/or hemorrhagic cysts, but incompletely characterized, largest of which extends exophytically from the upper pole of the right kidney (axial image 39 of series 17) measuring up to 2.3 cm in diameter. No clearly aggressive appearing renal lesions are noted on today's noncontrast examination. No hydroureteronephrosis in the visualized portions of the abdomen. Bilateral  adrenal glands are normal in appearance. Stomach/Bowel: Visualized portions are unremarkable. Vascular/Lymphatic: No aneurysm identified in the visualized abdominal vasculature. No lymphadenopathy noted in the abdomen. Other: No significant volume of ascites noted in the visualized portions of the peritoneal cavity. Musculoskeletal: No aggressive appearing osseous lesions are noted in the visualized portions of the skeleton. IMPRESSION: 1. Histologic changes in the liver suggesting underlying cirrhosis. No other explanation for the patient's jaundice identified. Specifically, no choledocholithiasis or signs of biliary tract obstruction. 2. There appears to be a small amount of biliary sludge and tiny gallstones lying dependently in the gallbladder. No imaging findings to suggest an acute cholecystitis are noted at this time. 3. Small pancreatic lesions incompletely characterized on today's noncontrast examination, but favored to represent small pancreatic pseudocysts or side branch IPMN (intraductal papillary mucinous neoplasm). Repeat abdominal MRI with and without IV gadolinium with MRCP (or pancreatic protocol CT scan) should be considered in 1 year to ensure the stability of these findings. This recommendation follows ACR consensus guidelines: Management of Incidental Pancreatic Cysts: A White Paper of the ACR Incidental Findings Committee. J Am Coll Radiol 9833;82:505-397. 4. Numerous tiny T2 hyperintensities scattered throughout the spleen, incompletely characterized on today's noncontrast examination. This could simply represent benign lesions such as small cavernous hemangiomas or lymphangioma is, however, metastatic disease to the spleen is not excluded. Correlation with clinical history is recommended, and consideration for repeat abdominal MRI with and without IV gadolinium could be considered if there is clinical concern for underlying malignancy. 5. Multiple renal lesions bilaterally, incompletely  characterized on today's noncontrast examination, but likely to represent a combination of simple and proteinaceous/hemorrhagic cysts. These too could be characterized on follow-up MRI of the abdomen with and without IV gadolinium if of clinical concern. Electronically Signed   By: Vinnie Langton M.D.   On: 12/18/2022 06:12   MR 3D Recon At Scanner  Result Date: 12/18/2022 CLINICAL DATA:  77 year old male with history of jaundice. EXAM: MRI ABDOMEN WITHOUT CONTRAST  (INCLUDING MRCP) TECHNIQUE: Multiplanar multisequence MR imaging of the abdomen was performed. Heavily T2-weighted images of the biliary and pancreatic ducts were obtained, and three-dimensional MRCP images were rendered by post processing. COMPARISON:  No prior abdominal MRI.  CT of the abdomen and pelvis 12/17/2022. FINDINGS: Comment: Today's study is limited for detection and characterization of visceral and/or vascular lesions by lack of IV gadolinium. Lower chest: Unremarkable. Hepatobiliary: Liver has a nodular contour, suggesting underlying cirrhosis. No discrete cystic or solid hepatic lesions are confidently identified on today's noncontrast examination. There is some amorphous material lying dependently in the neck of the gallbladder, along with some tiny well-defined filling defects in this region, compatible with a combination of biliary sludge and tiny gallstones. Gallbladder is moderately distended. Gallbladder wall thickness appears normal. No gallbladder wall edema. No pericholecystic fluid or surrounding inflammatory changes. No intra or extrahepatic biliary ductal dilatation noted on MRCP images. Common bile duct measures only 4 mm in the porta hepatis. No definite filling defects within the common bile duct to suggest choledocholithiasis. Pancreas: Several small T1 hypointense, T2 hyperintense lesions are noted in the pancreas, incompletely characterized on today's noncontrast examination, but likely to represent tiny pancreatic  pseudocysts or side branch IPMN (intraductal papillary mucinous neoplasm). The largest of these is in the body of the pancreas (axial image 18 of series 4 and coronal image 24 of series 3) measuring 1.4 x 0.7 x 1.2 cm. Direct communication with the main pancreatic duct can not be confirmed with any of these lesions. Main pancreatic duct is not dilated. No definite solid appearing pancreatic mass confidently identified on today's noncontrast examination. No peripancreatic fluid collections. Spleen: Numerous small T2 hyperintensities are scattered throughout the spleen, nonspecific in incompletely characterized on today's noncontrast examination. Spleen appears mildly enlarged measuring 13.1 x 14.5 x 5.8 cm (estimated splenic volume of 551 mL) . Adrenals/Urinary Tract: Numerous renal lesions bilaterally, incompletely characterized on today's noncontrast examination. The majority of these are T1 hypointense and T2 hyperintense, likely large cysts, measuring up to 9.5 cm extending exophytically from the lower pole of the right kidney. Other lesions demonstrate variable degrees of T1 hyperintensity, some with T2 hypointensity, potentially proteinaceous and/or hemorrhagic cysts, but incompletely characterized, largest of which extends exophytically from the upper pole of the right kidney (axial image 39 of series 17) measuring up to 2.3 cm in diameter. No clearly aggressive appearing renal lesions are noted on today's noncontrast examination. No hydroureteronephrosis in the visualized portions of the abdomen. Bilateral adrenal glands are normal in appearance. Stomach/Bowel: Visualized portions are unremarkable. Vascular/Lymphatic: No aneurysm identified in the visualized abdominal vasculature. No lymphadenopathy noted in the abdomen. Other: No significant volume of ascites noted in the visualized portions of the peritoneal cavity. Musculoskeletal: No aggressive appearing osseous lesions are noted in the visualized  portions of the skeleton. IMPRESSION: 1. Histologic changes in the liver suggesting underlying cirrhosis. No other explanation for the patient's jaundice identified. Specifically, no choledocholithiasis or signs of biliary tract obstruction. 2. There appears to be a small amount of biliary sludge and tiny gallstones lying dependently in the gallbladder. No imaging findings to suggest an acute cholecystitis are noted at this time. 3. Small pancreatic lesions incompletely characterized on today's noncontrast examination, but favored to represent small pancreatic pseudocysts or side branch IPMN (intraductal papillary mucinous neoplasm). Repeat abdominal MRI with and without IV gadolinium with MRCP (or pancreatic protocol CT scan) should be considered in 1 year to ensure the stability of these findings. This recommendation follows ACR consensus guidelines: Management of Incidental Pancreatic Cysts: A White Paper of the ACR Incidental Findings Committee. J Am Coll Radiol 2458;09:983-382. 4. Numerous tiny T2 hyperintensities scattered throughout the spleen, incompletely characterized on today's noncontrast examination. This could simply  represent benign lesions such as small cavernous hemangiomas or lymphangioma is, however, metastatic disease to the spleen is not excluded. Correlation with clinical history is recommended, and consideration for repeat abdominal MRI with and without IV gadolinium could be considered if there is clinical concern for underlying malignancy. 5. Multiple renal lesions bilaterally, incompletely characterized on today's noncontrast examination, but likely to represent a combination of simple and proteinaceous/hemorrhagic cysts. These too could be characterized on follow-up MRI of the abdomen with and without IV gadolinium if of clinical concern. Electronically Signed   By: Vinnie Langton M.D.   On: 12/18/2022 06:12   CT ABDOMEN PELVIS W CONTRAST  Result Date: 12/17/2022 CLINICAL DATA:   Acute pancreatitis, severe painless jaundice. EXAM: CT ABDOMEN AND PELVIS WITH CONTRAST TECHNIQUE: Multidetector CT imaging of the abdomen and pelvis was performed using the standard protocol following bolus administration of intravenous contrast. RADIATION DOSE REDUCTION: This exam was performed according to the departmental dose-optimization program which includes automated exposure control, adjustment of the mA and/or kV according to patient size and/or use of iterative reconstruction technique. CONTRAST:  140m OMNIPAQUE IOHEXOL 300 MG/ML  SOLN COMPARISON:  Abdominal ultrasound 01/19/2020. Abdominal ultrasound 12/17/2022. FINDINGS: Lower chest: No acute abnormality. The heart is enlarged. There is a small pericardial effusion. Hepatobiliary: No focal liver abnormality is seen. No gallstones, gallbladder wall thickening, or biliary dilatation. Pancreas: There are trace inflammatory changes surrounding the uncinate process of the pancreas. There is no pancreatic ductal dilatation or fluid collection. Spleen: There subcentimeter hypodensities scattered throughout the spleen which may represent small cysts or hemangiomas. Spleen is otherwise within normal limits. Adrenals/Urinary Tract: There are numerous bilateral renal cysts. The largest cyst on the right is in the inferior pole measuring 9.7 cm. The largest cyst on the left is in the inferior pole measuring 7.0 cm. Mildly hyperdense rounded area in the superior pole of the right kidney measures 2.4 cm. There are punctate nonobstructing right renal calculi. There is no hydronephrosis in either kidney. The adrenal glands and bladder are within normal limits. Stomach/Bowel: Stomach is within normal limits. Appendix appears normal. No evidence of bowel wall thickening, distention, or inflammatory changes. There is sigmoid and descending colon diverticulosis. Vascular/Lymphatic: Aortic atherosclerosis. No enlarged abdominal or pelvic lymph nodes. Reproductive:  Prostate is unremarkable. Other: There is a small amount of free fluid in the pelvis. There is no focal abdominal wall hernia. Musculoskeletal: Multilevel degenerative changes affect the spine. IMPRESSION: 1. Trace inflammatory changes surrounding the uncinate process of the pancreas compatible with acute pancreatitis. No evidence for pancreatic necrosis or fluid collection. 2. Small amount of free fluid in the pelvis. 3. Small pericardial effusion. 4. Numerous bilateral renal cysts. There is a mildly hyperdense rounded area in the superior pole of the right kidney measuring 2.4 cm. This may represent a complex cyst or solid lesion. No follow-up imaging recommended. 5. Nonobstructing right renal calculi. 6. Colonic diverticulosis. 7. There subcentimeter hypodensities scattered throughout the spleen which may represent small cysts or hemangiomas. Aortic Atherosclerosis (ICD10-I70.0). Electronically Signed   By: ARonney AstersM.D.   On: 12/17/2022 21:34   [place]DOM[place] LIMITED RUQ (LIVER/GB)  Result Date: 12/17/2022 CLINICAL DATA:  Jaundice EXAM: ULTRASOUND ABDOMEN LIMITED RIGHT UPPER QUADRANT COMPARISON:  Renal ultrasound dated August 02, 2022 FINDINGS: Gallbladder: No gallstones or wall thickening visualized. Sludge seen in the gallbladder. No sonographic Murphy sign noted by sonographer. Common bile duct: Diameter: 3 mm Liver: No focal lesion identified. Within normal limits in parenchymal echogenicity. Portal vein  is patent on color Doppler imaging with normal direction of blood flow towards the liver. Other: Incidental note is made of right pleural effusion. Partially visualized simple appearing right renal cysts, largest measures 8.0 cm, see previously performed renal ultrasound for more complete evaluation. IMPRESSION: 1. Sludge seen in the gallbladder. No sonographic evidence of acute cholecystitis. 2. Right pleural effusion. Electronically Signed   By: Yetta Glassman M.D.   On: 12/17/2022 20:20       Impression / Plan:   Vernon [name] is a 77 y.o. male with history of A-fib s/p watchman's procedure, diabetes well-controlled, maintained on metformin is admitted with cholestatic hepatitis and UTI  Cholestatic hepatitis: No evidence of biliary obstruction based on ultrasound, CT and MRCP Mildly elevated lipase and his clinical presentation is not consistent with acute pancreatitis Does not appear to have acute cholecystitis Patient did have intermittent mildly elevated alkaline phosphatase along with total bilirubin within last 1 to 2 years He has chronically elevated total bilirubin which was mild until this admission.  It is predominantly direct bilirubin which is consistent with cholestasis rather than hemolysis Patient also has chronically elevated ferritin above 500 and % iron saturation above 40 which raises suspicion for possibility of underlying chronic liver disease from hemochromatosis No evidence of portal hypertension Acute worsening of LFTs are likely in the setting of UTI and possibility of progression of underlying liver disease Also, indeterminate lesions in the pancreas and spleen on noncontrast MRI may or may not be clinically relevant Recommend MRI abdomen with and without contrast if renal function is permissible Ordered comprehensive secondary liver disease workup, acute viral hepatitis panel is negative Continue maintenance IV fluids left is closely Discussed with patient that he may need liver biopsy based on the secondary liver disease workup Avoid hepatotoxic agents Treat underlying infection, urine cultures are pending, consider blood cultures  Discussed in length regarding my clinical judgment, interpretation, recommendations with both patient and his wife and they both seem to have good understanding of his current condition  Thank you for involving me in the care of this patient.  GI will follow along with you    LOS: 1 day   Sherri Sear, MD  12/18/2022,  11:11 PM    Note: This dictation was prepared with Dragon dictation along with smaller phrase technology. Any transcriptional errors that result from this process are unintentional.

## 2022-12-18 NOTE — Progress Notes (Addendum)
PROGRESS NOTE    Vernon [name]  FKC:[id number] DOB: 11-Nov-1946 DOA: 12/17/2022 PCP: Baxter Hire, MD   Brief Narrative:  77 y.o. male with medical history significant of anemia, GERD, essential hypertension, myelodysplastic disease, history of watchman procedure, aortic stenosis, type 2 diabetes, paroxysmal atrial fibrillation presented for evaluation of jaundice from the [facility].  On presentation, bilirubin was more than 10.  CT of abdomen and pelvis showed peripancreatic inflammation suspicious for possible acute pancreatitis.  GI was consulted.  Assessment & Plan:   Elevated LFTs including elevated bilirubin ?  Acute pancreatitis -Presented with elevated LFTs and elevated bilirubin: Bilirubin 13.1 on presentation.  Improving to 10.7.  LFTs slightly improving as well.  Repeat a.m. labs. -Lipase only 54 on presentation but CT of the abdomen suggestive of acute pancreatitis: Abdominal exam is benign with no tenderness.  MRCP negative for CBD stones or biliary masses -GI evaluation pending.  Follow recommendations. -Check triglycerides in AM. -Repeat a.m. LFTs  Possible UTI: Present on admission -Received a dose of Rocephin on presentation.  Continue Rocephin.  Follow cultures  Leukocytosis -Resolved  Chronic A-fib -Status post Watchman procedure.  Not on anticoagulation as an outpatient currently  AKI on CKD stage IIIa -creatinine 1.75 on presentation.  Improving to 1.16.  Continue IV fluids.  Repeat a.m. labs.  Hyperlipidemia Resume statin on discharge  Essential hypertension -Blood pressure intermittently elevated.  Monitor.  Resume home regimen once able to take orally  Diabetes mellitus type 2 -Continue CBGs with SSI  Normocytic anemia/anemia of chronic disease -From kidney disease.  Hemoglobin stable.  Obesity -Outpatient follow-up  DVT prophylaxis: SCDs Code Status: Full Family Communication: Wife at bedside Disposition Plan: Status is:  Inpatient Remains inpatient appropriate because: Of severity of illness.  Need for IV antibiotics and fluids and GI evaluation  Consultants: GI  Procedures: None  Antimicrobials: Rocephin   Subjective: Patient seen and examined at bedside.  Denies any worsening abdominal pain, fever, nausea or vomiting.  Objective: Vitals:   12/17/22 1636 12/17/22 2133 12/18/22 0306  BP: (!) 131/109 (!) 141/68 (!) 145/75  Pulse: 64 65 70  Resp: '18 17 18  '$ Temp: 98.2 F (36.8 C) 97.6 F (36.4 C) 98.6 F (37 C)  TempSrc: Oral Oral   SpO2: 96% 96% 98%  Weight:  92.5 kg   Height:  '5\' 9"'$  (1.753 m)    No intake or output data in the 24 hours ending 12/18/22 1052 Filed Weights   12/17/22 2133  Weight: 92.5 kg    Examination:  General exam: Appears calm and comfortable.  On room air. Respiratory system: Bilateral decreased breath sounds at bases Cardiovascular system: S1 & S2 heard, Rate controlled Gastrointestinal system: Abdomen is obese, nondistended, soft and nontender. Normal bowel sounds heard. Extremities: No cyanosis, clubbing, edema  Central nervous system: Alert and oriented.  Slow to respond.  Poor historian.  No focal neurological deficits. Moving extremities Skin: No rashes, lesions or ulcers Psychiatry: Not agitated.  Intermittently smiling   Data Reviewed: I have personally reviewed following labs and imaging studies  CBC: Recent Labs  Lab 12/12/22 0919 12/17/22 2041 12/18/22 0556  WBC 5.7 13.7* 10.1  NEUTROABS 3.6  --   --   HGB 9.5* 9.7* 8.7*  HCT 29.5* 29.9* 27.0*  MCV 96.1 95.2 95.4  PLT 283 246 749   Basic Metabolic Panel: Recent Labs  Lab 12/12/22 0919 12/17/22 2041 12/18/22 0556 12/18/22 1020  NA 139 135  --  138  K 4.3  4.5  --  4.3  CL 105 100  --  105  CO2 25 23  --  21*  GLUCOSE 156* 113*  --  155*  BUN 27* 47*  --  45*  CREATININE 1.29* 1.75* 1.75* 1.69*  CALCIUM 9.4 9.3  --  8.6*   GFR: Estimated Creatinine Clearance: 41.8 mL/min (A) (by  C-G formula based on SCr of 1.69 mg/dL (H)). Liver Function Tests: Recent Labs  Lab 12/17/22 2041 12/18/22 1020  AST 102* 72*  ALT 105* 86*  ALKPHOS 284* 263*  BILITOT 13.1* 10.7*  PROT 8.3* 7.4  ALBUMIN 4.4 3.8   Recent Labs  Lab 12/17/22 2041  LIPASE 54*   No results for input(s): "AMMONIA" in the last 168 hours. Coagulation Profile: Recent Labs  Lab 12/17/22 2041  INR 1.3*   Cardiac Enzymes: No results for input(s): "CKTOTAL", "CKMB", "CKMBINDEX", "TROPONINI" in the last 168 hours. BNP (last 3 results) No results for input(s): "PROBNP" in the last 8760 hours. HbA1C: No results for input(s): "HGBA1C" in the last 72 hours. CBG: Recent Labs  Lab 12/18/22 0908  GLUCAP 118*   Lipid Profile: No results for input(s): "CHOL", "HDL", "LDLCALC", "TRIG", "CHOLHDL", "LDLDIRECT" in the last 72 hours. Thyroid Function Tests: No results for input(s): "TSH", "T4TOTAL", "FREET4", "T3FREE", "THYROIDAB" in the last 72 hours. Anemia Panel: No results for input(s): "VITAMINB12", "FOLATE", "FERRITIN", "TIBC", "IRON", "RETICCTPCT" in the last 72 hours. Sepsis Labs: No results for input(s): "PROCALCITON", "LATICACIDVEN" in the last 168 hours.  No results found for this or any previous visit (from the past 240 hour(s)).       Radiology Studies: MR ABDOMEN MRCP WO CONTRAST  Result Date: 12/18/2022 CLINICAL DATA:  77 year old male with history of jaundice. EXAM: MRI ABDOMEN WITHOUT CONTRAST  (INCLUDING MRCP) TECHNIQUE: Multiplanar multisequence MR imaging of the abdomen was performed. Heavily T2-weighted images of the biliary and pancreatic ducts were obtained, and three-dimensional MRCP images were rendered by post processing. COMPARISON:  No prior abdominal MRI. CT of the abdomen and pelvis 12/17/2022. FINDINGS: Comment: Today's study is limited for detection and characterization of visceral and/or vascular lesions by lack of IV gadolinium. Lower chest: Unremarkable. Hepatobiliary:  Liver has a nodular contour, suggesting underlying cirrhosis. No discrete cystic or solid hepatic lesions are confidently identified on today's noncontrast examination. There is some amorphous material lying dependently in the neck of the gallbladder, along with some tiny well-defined filling defects in this region, compatible with a combination of biliary sludge and tiny gallstones. Gallbladder is moderately distended. Gallbladder wall thickness appears normal. No gallbladder wall edema. No pericholecystic fluid or surrounding inflammatory changes. No intra or extrahepatic biliary ductal dilatation noted on MRCP images. Common bile duct measures only 4 mm in the porta hepatis. No definite filling defects within the common bile duct to suggest choledocholithiasis. Pancreas: Several small T1 hypointense, T2 hyperintense lesions are noted in the pancreas, incompletely characterized on today's noncontrast examination, but likely to represent tiny pancreatic pseudocysts or side branch IPMN (intraductal papillary mucinous neoplasm). The largest of these is in the body of the pancreas (axial image 18 of series 4 and coronal image 24 of series 3) measuring 1.4 x 0.7 x 1.2 cm. Direct communication with the main pancreatic duct can not be confirmed with any of these lesions. Main pancreatic duct is not dilated. No definite solid appearing pancreatic mass confidently identified on today's noncontrast examination. No peripancreatic fluid collections. Spleen: Numerous small T2 hyperintensities are scattered throughout the spleen, nonspecific in incompletely  characterized on today's noncontrast examination. Spleen appears mildly enlarged measuring 13.1 x 14.5 x 5.8 cm (estimated splenic volume of 551 mL) . Adrenals/Urinary Tract: Numerous renal lesions bilaterally, incompletely characterized on today's noncontrast examination. The majority of these are T1 hypointense and T2 hyperintense, likely large cysts, measuring up to 9.5  cm extending exophytically from the lower pole of the right kidney. Other lesions demonstrate variable degrees of T1 hyperintensity, some with T2 hypointensity, potentially proteinaceous and/or hemorrhagic cysts, but incompletely characterized, largest of which extends exophytically from the upper pole of the right kidney (axial image 39 of series 17) measuring up to 2.3 cm in diameter. No clearly aggressive appearing renal lesions are noted on today's noncontrast examination. No hydroureteronephrosis in the visualized portions of the abdomen. Bilateral adrenal glands are normal in appearance. Stomach/Bowel: Visualized portions are unremarkable. Vascular/Lymphatic: No aneurysm identified in the visualized abdominal vasculature. No lymphadenopathy noted in the abdomen. Other: No significant volume of ascites noted in the visualized portions of the peritoneal cavity. Musculoskeletal: No aggressive appearing osseous lesions are noted in the visualized portions of the skeleton. IMPRESSION: 1. Histologic changes in the liver suggesting underlying cirrhosis. No other explanation for the patient's jaundice identified. Specifically, no choledocholithiasis or signs of biliary tract obstruction. 2. There appears to be a small amount of biliary sludge and tiny gallstones lying dependently in the gallbladder. No imaging findings to suggest an acute cholecystitis are noted at this time. 3. Small pancreatic lesions incompletely characterized on today's noncontrast examination, but favored to represent small pancreatic pseudocysts or side branch IPMN (intraductal papillary mucinous neoplasm). Repeat abdominal MRI with and without IV gadolinium with MRCP (or pancreatic protocol CT scan) should be considered in 1 year to ensure the stability of these findings. This recommendation follows ACR consensus guidelines: Management of Incidental Pancreatic Cysts: A White Paper of the ACR Incidental Findings Committee. J Am Coll Radiol  0960;45:409-811. 4. Numerous tiny T2 hyperintensities scattered throughout the spleen, incompletely characterized on today's noncontrast examination. This could simply represent benign lesions such as small cavernous hemangiomas or lymphangioma is, however, metastatic disease to the spleen is not excluded. Correlation with clinical history is recommended, and consideration for repeat abdominal MRI with and without IV gadolinium could be considered if there is clinical concern for underlying malignancy. 5. Multiple renal lesions bilaterally, incompletely characterized on today's noncontrast examination, but likely to represent a combination of simple and proteinaceous/hemorrhagic cysts. These too could be characterized on follow-up MRI of the abdomen with and without IV gadolinium if of clinical concern. Electronically Signed   By: Vinnie Langton M.D.   On: 12/18/2022 06:12   MR 3D Recon At Scanner  Result Date: 12/18/2022 CLINICAL DATA:  77 year old male with history of jaundice. EXAM: MRI ABDOMEN WITHOUT CONTRAST  (INCLUDING MRCP) TECHNIQUE: Multiplanar multisequence MR imaging of the abdomen was performed. Heavily T2-weighted images of the biliary and pancreatic ducts were obtained, and three-dimensional MRCP images were rendered by post processing. COMPARISON:  No prior abdominal MRI. CT of the abdomen and pelvis 12/17/2022. FINDINGS: Comment: Today's study is limited for detection and characterization of visceral and/or vascular lesions by lack of IV gadolinium. Lower chest: Unremarkable. Hepatobiliary: Liver has a nodular contour, suggesting underlying cirrhosis. No discrete cystic or solid hepatic lesions are confidently identified on today's noncontrast examination. There is some amorphous material lying dependently in the neck of the gallbladder, along with some tiny well-defined filling defects in this region, compatible with a combination of biliary sludge and tiny gallstones. Gallbladder is  moderately distended. Gallbladder wall thickness appears normal. No gallbladder wall edema. No pericholecystic fluid or surrounding inflammatory changes. No intra or extrahepatic biliary ductal dilatation noted on MRCP images. Common bile duct measures only 4 mm in the porta hepatis. No definite filling defects within the common bile duct to suggest choledocholithiasis. Pancreas: Several small T1 hypointense, T2 hyperintense lesions are noted in the pancreas, incompletely characterized on today's noncontrast examination, but likely to represent tiny pancreatic pseudocysts or side branch IPMN (intraductal papillary mucinous neoplasm). The largest of these is in the body of the pancreas (axial image 18 of series 4 and coronal image 24 of series 3) measuring 1.4 x 0.7 x 1.2 cm. Direct communication with the main pancreatic duct can not be confirmed with any of these lesions. Main pancreatic duct is not dilated. No definite solid appearing pancreatic mass confidently identified on today's noncontrast examination. No peripancreatic fluid collections. Spleen: Numerous small T2 hyperintensities are scattered throughout the spleen, nonspecific in incompletely characterized on today's noncontrast examination. Spleen appears mildly enlarged measuring 13.1 x 14.5 x 5.8 cm (estimated splenic volume of 551 mL) . Adrenals/Urinary Tract: Numerous renal lesions bilaterally, incompletely characterized on today's noncontrast examination. The majority of these are T1 hypointense and T2 hyperintense, likely large cysts, measuring up to 9.5 cm extending exophytically from the lower pole of the right kidney. Other lesions demonstrate variable degrees of T1 hyperintensity, some with T2 hypointensity, potentially proteinaceous and/or hemorrhagic cysts, but incompletely characterized, largest of which extends exophytically from the upper pole of the right kidney (axial image 39 of series 17) measuring up to 2.3 cm in diameter. No clearly  aggressive appearing renal lesions are noted on today's noncontrast examination. No hydroureteronephrosis in the visualized portions of the abdomen. Bilateral adrenal glands are normal in appearance. Stomach/Bowel: Visualized portions are unremarkable. Vascular/Lymphatic: No aneurysm identified in the visualized abdominal vasculature. No lymphadenopathy noted in the abdomen. Other: No significant volume of ascites noted in the visualized portions of the peritoneal cavity. Musculoskeletal: No aggressive appearing osseous lesions are noted in the visualized portions of the skeleton. IMPRESSION: 1. Histologic changes in the liver suggesting underlying cirrhosis. No other explanation for the patient's jaundice identified. Specifically, no choledocholithiasis or signs of biliary tract obstruction. 2. There appears to be a small amount of biliary sludge and tiny gallstones lying dependently in the gallbladder. No imaging findings to suggest an acute cholecystitis are noted at this time. 3. Small pancreatic lesions incompletely characterized on today's noncontrast examination, but favored to represent small pancreatic pseudocysts or side branch IPMN (intraductal papillary mucinous neoplasm). Repeat abdominal MRI with and without IV gadolinium with MRCP (or pancreatic protocol CT scan) should be considered in 1 year to ensure the stability of these findings. This recommendation follows ACR consensus guidelines: Management of Incidental Pancreatic Cysts: A White Paper of the ACR Incidental Findings Committee. J Am Coll Radiol 9678;93:810-175. 4. Numerous tiny T2 hyperintensities scattered throughout the spleen, incompletely characterized on today's noncontrast examination. This could simply represent benign lesions such as small cavernous hemangiomas or lymphangioma is, however, metastatic disease to the spleen is not excluded. Correlation with clinical history is recommended, and consideration for repeat abdominal MRI with  and without IV gadolinium could be considered if there is clinical concern for underlying malignancy. 5. Multiple renal lesions bilaterally, incompletely characterized on today's noncontrast examination, but likely to represent a combination of simple and proteinaceous/hemorrhagic cysts. These too could be characterized on follow-up MRI of the abdomen with and without IV gadolinium if of  clinical concern. Electronically Signed   By: Vinnie Langton M.D.   On: 12/18/2022 06:12   CT ABDOMEN PELVIS W CONTRAST  Result Date: 12/17/2022 CLINICAL DATA:  Acute pancreatitis, severe painless jaundice. EXAM: CT ABDOMEN AND PELVIS WITH CONTRAST TECHNIQUE: Multidetector CT imaging of the abdomen and pelvis was performed using the standard protocol following bolus administration of intravenous contrast. RADIATION DOSE REDUCTION: This exam was performed according to the departmental dose-optimization program which includes automated exposure control, adjustment of the mA and/or kV according to patient size and/or use of iterative reconstruction technique. CONTRAST:  127m OMNIPAQUE IOHEXOL 300 MG/ML  SOLN COMPARISON:  Abdominal ultrasound 01/19/2020. Abdominal ultrasound 12/17/2022. FINDINGS: Lower chest: No acute abnormality. The heart is enlarged. There is a small pericardial effusion. Hepatobiliary: No focal liver abnormality is seen. No gallstones, gallbladder wall thickening, or biliary dilatation. Pancreas: There are trace inflammatory changes surrounding the uncinate process of the pancreas. There is no pancreatic ductal dilatation or fluid collection. Spleen: There subcentimeter hypodensities scattered throughout the spleen which may represent small cysts or hemangiomas. Spleen is otherwise within normal limits. Adrenals/Urinary Tract: There are numerous bilateral renal cysts. The largest cyst on the right is in the inferior pole measuring 9.7 cm. The largest cyst on the left is in the inferior pole measuring 7.0 cm.  Mildly hyperdense rounded area in the superior pole of the right kidney measures 2.4 cm. There are punctate nonobstructing right renal calculi. There is no hydronephrosis in either kidney. The adrenal glands and bladder are within normal limits. Stomach/Bowel: Stomach is within normal limits. Appendix appears normal. No evidence of bowel wall thickening, distention, or inflammatory changes. There is sigmoid and descending colon diverticulosis. Vascular/Lymphatic: Aortic atherosclerosis. No enlarged abdominal or pelvic lymph nodes. Reproductive: Prostate is unremarkable. Other: There is a small amount of free fluid in the pelvis. There is no focal abdominal wall hernia. Musculoskeletal: Multilevel degenerative changes affect the spine. IMPRESSION: 1. Trace inflammatory changes surrounding the uncinate process of the pancreas compatible with acute pancreatitis. No evidence for pancreatic necrosis or fluid collection. 2. Small amount of free fluid in the pelvis. 3. Small pericardial effusion. 4. Numerous bilateral renal cysts. There is a mildly hyperdense rounded area in the superior pole of the right kidney measuring 2.4 cm. This may represent a complex cyst or solid lesion. No follow-up imaging recommended. 5. Nonobstructing right renal calculi. 6. Colonic diverticulosis. 7. There subcentimeter hypodensities scattered throughout the spleen which may represent small cysts or hemangiomas. Aortic Atherosclerosis (ICD10-I70.0). Electronically Signed   By: ARonney AstersM.D.   On: 12/17/2022 21:34   [place] LIMITED RUQ (LIVER/GB)  Result Date: 12/17/2022 CLINICAL DATA:  Jaundice EXAM: ULTRASOUND ABDOMEN LIMITED RIGHT UPPER QUADRANT COMPARISON:  Renal ultrasound dated August 02, 2022 FINDINGS: Gallbladder: No gallstones or wall thickening visualized. Sludge seen in the gallbladder. No sonographic Murphy sign noted by sonographer. Common bile duct: Diameter: 3 mm Liver: No focal lesion identified. Within normal  limits in parenchymal echogenicity. Portal vein is patent on color Doppler imaging with normal direction of blood flow towards the liver. Other: Incidental note is made of right pleural effusion. Partially visualized simple appearing right renal cysts, largest measures 8.0 cm, see previously performed renal ultrasound for more complete evaluation. IMPRESSION: 1. Sludge seen in the gallbladder. No sonographic evidence of acute cholecystitis. 2. Right pleural effusion. Electronically Signed   By: LYetta GlassmanM.D.   On: 12/17/2022 20:20        Scheduled Meds:  heparin  5,000  Units Subcutaneous Q8H   insulin aspart  0-15 Units Subcutaneous TID WC   insulin aspart  0-5 Units Subcutaneous QHS   Continuous Infusions:  sodium chloride     sodium chloride     sodium chloride 125 mL/hr at 12/18/22 0308          Aline August, MD [facility] 12/18/2022, 10:52 AM

## 2022-12-18 NOTE — Plan of Care (Signed)

## 2022-12-19 DIAGNOSIS — K7589 Other specified inflammatory liver diseases: Secondary | ICD-10-CM | POA: Diagnosis not present

## 2022-12-19 DIAGNOSIS — N390 Urinary tract infection, site not specified: Secondary | ICD-10-CM | POA: Diagnosis not present

## 2022-12-19 DIAGNOSIS — K831 Obstruction of bile duct: Secondary | ICD-10-CM | POA: Diagnosis not present

## 2022-12-19 LAB — COMPREHENSIVE METABOLIC PANEL
ALT: 65 U/L — ABNORMAL HIGH (ref 0–44)
AST: 53 U/L — ABNORMAL HIGH (ref 15–41)
Albumin: 3.8 g/dL (ref 3.5–5.0)
Alkaline Phosphatase: 290 U/L — ABNORMAL HIGH (ref 38–126)
Anion gap: 11 (ref 5–15)
BUN: 32 mg/dL — ABNORMAL HIGH (ref 8–23)
CO2: 22 mmol/L (ref 22–32)
Calcium: 8.7 mg/dL — ABNORMAL LOW (ref 8.9–10.3)
Chloride: 105 mmol/L (ref 98–111)
Creatinine, Ser: 1.32 mg/dL — ABNORMAL HIGH (ref 0.61–1.24)
GFR, Estimated: 56 mL/min — ABNORMAL LOW (ref >60)
Glucose, Bld: 130 mg/dL — ABNORMAL HIGH (ref 70–99)
Potassium: 4.2 mmol/L (ref 3.5–5.1)
Sodium: 138 mmol/L (ref 135–145)
Total Bilirubin: 8.3 mg/dL — ABNORMAL HIGH (ref 0.3–1.2)
Total Protein: 7.5 g/dL (ref 6.5–8.1)

## 2022-12-19 LAB — MAGNESIUM: Magnesium: 1.6 mg/dL — ABNORMAL LOW (ref 1.7–2.4)

## 2022-12-19 LAB — GLUCOSE, CAPILLARY
Glucose-Capillary: 134 mg/dL — ABNORMAL HIGH (ref 70–99)
Glucose-Capillary: 122 mg/dL — ABNORMAL HIGH (ref 70–99)
Glucose-Capillary: 119 mg/dL — ABNORMAL HIGH (ref 70–99)
Glucose-Capillary: 175 mg/dL — ABNORMAL HIGH (ref 70–99)

## 2022-12-19 LAB — CBC WITH DIFFERENTIAL/PLATELET
Abs Immature Granulocytes: 0.04 10*3/uL (ref 0.00–0.07)
Basophils Absolute: 0 10*3/uL (ref 0.0–0.1)
Basophils Relative: 0 %
Eosinophils Absolute: 0.1 10*3/uL (ref 0.0–0.5)
Eosinophils Relative: 2 %
HCT: 26.7 % — ABNORMAL LOW (ref 39.0–52.0)
Hemoglobin: 8.7 g/dL — ABNORMAL LOW (ref 13.0–17.0)
Immature Granulocytes: 1 %
Lymphocytes Relative: 15 %
Lymphs Abs: 0.9 10*3/uL (ref 0.7–4.0)
MCH: 30.7 pg (ref 26.0–34.0)
MCHC: 32.6 g/dL (ref 30.0–36.0)
MCV: 94.3 fL (ref 80.0–100.0)
Monocytes Absolute: 0.5 10*3/uL (ref 0.1–1.0)
Monocytes Relative: 8 %
Neutro Abs: 4.5 10*3/uL (ref 1.7–7.7)
Neutrophils Relative %: 74 %
Platelets: 267 10*3/uL (ref 150–400)
RBC: 2.83 MIL/uL — ABNORMAL LOW (ref 4.22–5.81)
RDW: 27.6 % — ABNORMAL HIGH (ref 11.5–15.5)
Smear Review: NORMAL
WBC: 6 10*3/uL (ref 4.0–10.5)
nRBC: 0.3 % — ABNORMAL HIGH (ref 0.0–0.2)

## 2022-12-19 LAB — LIPID PANEL
Cholesterol: 187 mg/dL (ref 0–200)
HDL: 25 mg/dL — ABNORMAL LOW (ref >40)
LDL Cholesterol: 136 mg/dL — ABNORMAL HIGH (ref 0–99)
Total CHOL/HDL Ratio: 7.5 RATIO
Triglycerides: 128 mg/dL (ref <150)
VLDL: 26 mg/dL (ref 0–40)

## 2022-12-19 MED ORDER — TRAZODONE HCL 50 MG PO TABS: 50.0000 mg | ORAL_TABLET | Freq: Every evening | ORAL | Status: DC | PRN

## 2022-12-19 MED ORDER — ASPIRIN 81 MG PO TBEC
81.0000 mg | DELAYED_RELEASE_TABLET | Freq: Every day | ORAL | Status: DC
  Administered 2022-12-19 – 2022-12-20 (×2): 81 mg via ORAL
  Filled 2022-12-19 (×2): qty 1

## 2022-12-19 MED ORDER — METFORMIN HCL 500 MG PO TABS
500.0000 mg | ORAL_TABLET | Freq: Two times a day (BID) | ORAL | Status: DC
  Administered 2022-12-20: 500 mg via ORAL
  Filled 2022-12-19: qty 1

## 2022-12-19 NOTE — Progress Notes (Signed)
PROGRESS NOTE    Vernon [name]  YNW:[id number] DOB: 07-06-1946 DOA: 12/17/2022 PCP: Baxter Hire, MD    Brief Narrative:  77 y.o. male with medical history significant of anemia, GERD, essential hypertension, myelodysplastic disease, history of watchman procedure, aortic stenosis, type 2 diabetes, paroxysmal atrial fibrillation presented for evaluation of jaundice from the [facility].  On presentation, bilirubin was more than 10.  CT of abdomen and pelvis showed peripancreatic inflammation suspicious for possible acute pancreatitis.  GI was consulted.    Assessment & Plan:   Principal Problem:   Obstructive jaundice Active Problems:   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HCC)   Chronic atrial fibrillation (HCC)   Essential hypertension   Controlled type 2 diabetes mellitus with hyperglycemia, without long-term current use of insulin (HCC)   Dyslipidemia   Hyperlipemia, mixed   Presence of Watchman left atrial appendage closure device   Painless jaundice   Elevated LFTs   Cholestatic hepatitis  Elevated LFTs including elevated bilirubin ?  Acute pancreatitis -Presented with elevated LFTs and elevated bilirubin: Bilirubin 13.1 on presentation.  Improving to 10.7.  LFTs slightly improving as well.  Repeat a.m. labs. -Lipase only 54 on presentation but CT of the abdomen suggestive of acute pancreatitis: Abdominal exam is benign with no tenderness.  MRCP negative for CBD stones or biliary masses Plan: GI workup in progress.  ?indication for liver biopsy Daily LFTs   Possible UTI: Present on admission -Received a dose of Rocephin on presentation.  Continue Rocephin for now Urine culture in progress   Leukocytosis -Resolved   Chronic A-fib -Status post Watchman procedure.  Not on anticoagulation as an outpatient currently   AKI on CKD stage IIIa -creatinine 1.75 on presentation.  Improving to 1.16.  Continue IV fluids.  Repeat a.m. labs.   Hyperlipidemia Resume statin on  discharge   Essential hypertension -Blood pressure intermittently elevated.  Monitor.  Resume home regimen once able to take orally   Diabetes mellitus type 2 -Continue CBGs with SSI   Normocytic anemia/anemia of chronic disease -From kidney disease.  Hemoglobin stable.   Obesity -Outpatient follow-up   DVT prophylaxis: SCD Code Status: Full Family Communication: None today Disposition Plan: Status is: Inpatient Remains inpatient appropriate because: Cholestatic jaundice.  Unclear etiology.  Workup in progress.   Level of care: Med-Surg  Consultants:  GI  Procedures:  None  Antimicrobials: None   Subjective: Seen and examined.  Upset about continued hospitalization and need for blood draws.  Endorses poor sleep.  No pain complaints  Objective: Vitals:   12/18/22 1250 12/18/22 2224 12/19/22 0415 12/19/22 0800  BP: (!) 141/75 (!) 156/90 (!) 136/95 (!) 162/86  Pulse: 64 70 63 74  Resp: '16 20 20 20  '$ Temp: 97.7 F (36.5 C) 98.4 F (36.9 C) 97.8 F (36.6 C) (!) 97.4 F (36.3 C)  TempSrc: Oral Oral Oral Oral  SpO2: 99% 96% 95% 98%  Weight:      Height:        Intake/Output Summary (Last 24 hours) at 12/19/2022 1219 Last data filed at 12/19/2022 6578 Gross per 24 hour  Intake 1088.54 ml  Output 875 ml  Net 213.54 ml   Filed Weights   12/17/22 2133  Weight: 92.5 kg    Examination:  General exam: Appears calm and comfortable  Respiratory system: Clear to auscultation. Respiratory effort normal. Cardiovascular system: S1-S2, RRR, no murmurs, no pedal edema Gastrointestinal system: Soft, NT/ND, normal bowel sounds Central nervous system: Alert and oriented. No focal neurological  deficits. Extremities: Symmetric 5 x 5 power. Skin: Jaundice.  Scleral icterus Psychiatry: Judgement and insight appear normal. Mood & affect appropriate.     Data Reviewed: I have personally reviewed following labs and imaging studies  CBC: Recent Labs  Lab 12/17/22 2041  12/18/22 0556 12/19/22 1029  WBC 13.7* 10.1 6.0  NEUTROABS  --   --  4.5  HGB 9.7* 8.7* 8.7*  HCT 29.9* 27.0* 26.7*  MCV 95.2 95.4 94.3  PLT 246 226 631   Basic Metabolic Panel: Recent Labs  Lab 12/17/22 2041 12/18/22 0556 12/18/22 1020 12/19/22 1029  NA 135  --  138 138  K 4.5  --  4.3 4.2  CL 100  --  105 105  CO2 23  --  21* 22  GLUCOSE 113*  --  155* 130*  BUN 47*  --  45* 32*  CREATININE 1.75* 1.75* 1.69* 1.32*  CALCIUM 9.3  --  8.6* 8.7*  MG  --   --   --  1.6*   GFR: Estimated Creatinine Clearance: 53.5 mL/min (A) (by C-G formula based on SCr of 1.32 mg/dL (H)). Liver Function Tests: Recent Labs  Lab 12/17/22 2041 12/18/22 1020 12/19/22 1029  AST 102* 72* 53*  ALT 105* 86* 65*  ALKPHOS 284* 263* 290*  BILITOT 13.1* 10.7* 8.3*  PROT 8.3* 7.4 7.5  ALBUMIN 4.4 3.8 3.8   Recent Labs  Lab 12/17/22 2041  LIPASE 54*   No results for input(s): "AMMONIA" in the last 168 hours. Coagulation Profile: Recent Labs  Lab 12/17/22 2041  INR 1.3*   Cardiac Enzymes: No results for input(s): "CKTOTAL", "CKMB", "CKMBINDEX", "TROPONINI" in the last 168 hours. BNP (last 3 results) No results for input(s): "PROBNP" in the last 8760 hours. HbA1C: Recent Labs    12/18/22 0556  HGBA1C 5.7*   CBG: Recent Labs  Lab 12/18/22 0908 12/18/22 1747 12/18/22 2220 12/19/22 0745 12/19/22 1209  GLUCAP 118* 125* 116* 134* 122*   Lipid Profile: Recent Labs    12/19/22 1029  CHOL 187  HDL 25*  LDLCALC 136*  TRIG 128  CHOLHDL 7.5   Thyroid Function Tests: No results for input(s): "TSH", "T4TOTAL", "FREET4", "T3FREE", "THYROIDAB" in the last 72 hours. Anemia Panel: No results for input(s): "VITAMINB12", "FOLATE", "FERRITIN", "TIBC", "IRON", "RETICCTPCT" in the last 72 hours. Sepsis Labs: No results for input(s): "PROCALCITON", "LATICACIDVEN" in the last 168 hours.  Recent Results (from the past 240 hour(s))  Urine Culture     Status: Abnormal (Preliminary  result)   Collection Time: 12/17/22 10:42 PM   Specimen: Urine, Clean Catch  Result Value Ref Range Status   Specimen Description   Final    URINE, CLEAN CATCH Performed at [facility], [address], [place], [place] [address]    Special Requests   Final    NONE Performed at [name], [address] [place], [place] [address]    Culture (A)  Final    >=100,000 COLONIES/mL Lonell Grandchild NEGATIVE RODS SUSCEPTIBILITIES TO FOLLOW Performed at [facility], [place] [address], [place], [place] [address]    Report Status PENDING  Incomplete         Radiology Studies: MR ABDOMEN MRCP WO CONTRAST  Result Date: 12/18/2022 CLINICAL DATA:  77 year old male with history of jaundice. EXAM: MRI ABDOMEN WITHOUT CONTRAST  (INCLUDING MRCP) TECHNIQUE: Multiplanar multisequence MR imaging of the abdomen was performed. Heavily T2-weighted images of the biliary and pancreatic ducts were obtained, and three-dimensional MRCP images  were rendered by post processing. COMPARISON:  No prior abdominal MRI. CT of the abdomen and pelvis 12/17/2022. FINDINGS: Comment: Today's study is limited for detection and characterization of visceral and/or vascular lesions by lack of IV gadolinium. Lower chest: Unremarkable. Hepatobiliary: Liver has a nodular contour, suggesting underlying cirrhosis. No discrete cystic or solid hepatic lesions are confidently identified on today's noncontrast examination. There is some amorphous material lying dependently in the neck of the gallbladder, along with some tiny well-defined filling defects in this region, compatible with a combination of biliary sludge and tiny gallstones. Gallbladder is moderately distended. Gallbladder wall thickness appears normal. No gallbladder wall edema. No pericholecystic fluid or surrounding inflammatory changes. No intra or extrahepatic biliary ductal dilatation noted on MRCP images. Common bile duct measures only 4 mm in the porta  hepatis. No definite filling defects within the common bile duct to suggest choledocholithiasis. Pancreas: Several small T1 hypointense, T2 hyperintense lesions are noted in the pancreas, incompletely characterized on today's noncontrast examination, but likely to represent tiny pancreatic pseudocysts or side branch IPMN (intraductal papillary mucinous neoplasm). The largest of these is in the body of the pancreas (axial image 18 of series 4 and coronal image 24 of series 3) measuring 1.4 x 0.7 x 1.2 cm. Direct communication with the main pancreatic duct can not be confirmed with any of these lesions. Main pancreatic duct is not dilated. No definite solid appearing pancreatic mass confidently identified on today's noncontrast examination. No peripancreatic fluid collections. Spleen: Numerous small T2 hyperintensities are scattered throughout the spleen, nonspecific in incompletely characterized on today's noncontrast examination. Spleen appears mildly enlarged measuring 13.1 x 14.5 x 5.8 cm (estimated splenic volume of 551 mL) . Adrenals/Urinary Tract: Numerous renal lesions bilaterally, incompletely characterized on today's noncontrast examination. The majority of these are T1 hypointense and T2 hyperintense, likely large cysts, measuring up to 9.5 cm extending exophytically from the lower pole of the right kidney. Other lesions demonstrate variable degrees of T1 hyperintensity, some with T2 hypointensity, potentially proteinaceous and/or hemorrhagic cysts, but incompletely characterized, largest of which extends exophytically from the upper pole of the right kidney (axial image 39 of series 17) measuring up to 2.3 cm in diameter. No clearly aggressive appearing renal lesions are noted on today's noncontrast examination. No hydroureteronephrosis in the visualized portions of the abdomen. Bilateral adrenal glands are normal in appearance. Stomach/Bowel: Visualized portions are unremarkable. Vascular/Lymphatic: No  aneurysm identified in the visualized abdominal vasculature. No lymphadenopathy noted in the abdomen. Other: No significant volume of ascites noted in the visualized portions of the peritoneal cavity. Musculoskeletal: No aggressive appearing osseous lesions are noted in the visualized portions of the skeleton. IMPRESSION: 1. Histologic changes in the liver suggesting underlying cirrhosis. No other explanation for the patient's jaundice identified. Specifically, no choledocholithiasis or signs of biliary tract obstruction. 2. There appears to be a small amount of biliary sludge and tiny gallstones lying dependently in the gallbladder. No imaging findings to suggest an acute cholecystitis are noted at this time. 3. Small pancreatic lesions incompletely characterized on today's noncontrast examination, but favored to represent small pancreatic pseudocysts or side branch IPMN (intraductal papillary mucinous neoplasm). Repeat abdominal MRI with and without IV gadolinium with MRCP (or pancreatic protocol CT scan) should be considered in 1 year to ensure the stability of these findings. This recommendation follows ACR consensus guidelines: Management of Incidental Pancreatic Cysts: A White Paper of the ACR Incidental Findings Committee. [place] 8250;03:704-888. 4. Numerous tiny T2 hyperintensities scattered throughout  the spleen, incompletely characterized on today's noncontrast examination. This could simply represent benign lesions such as small cavernous hemangiomas or lymphangioma is, however, metastatic disease to the spleen is not excluded. Correlation with clinical history is recommended, and consideration for repeat abdominal MRI with and without IV gadolinium could be considered if there is clinical concern for underlying malignancy. 5. Multiple renal lesions bilaterally, incompletely characterized on today's noncontrast examination, but likely to represent a combination of simple and  proteinaceous/hemorrhagic cysts. These too could be characterized on follow-up MRI of the abdomen with and without IV gadolinium if of clinical concern. Electronically Signed   By: Vinnie Langton M.D.   On: 12/18/2022 06:12   MR 3D Recon At Scanner  Result Date: 12/18/2022 CLINICAL DATA:  77 year old male with history of jaundice. EXAM: MRI ABDOMEN WITHOUT CONTRAST  (INCLUDING MRCP) TECHNIQUE: Multiplanar multisequence MR imaging of the abdomen was performed. Heavily T2-weighted images of the biliary and pancreatic ducts were obtained, and three-dimensional MRCP images were rendered by post processing. COMPARISON:  No prior abdominal MRI. CT of the abdomen and pelvis 12/17/2022. FINDINGS: Comment: Today's study is limited for detection and characterization of visceral and/or vascular lesions by lack of IV gadolinium. Lower chest: Unremarkable. Hepatobiliary: Liver has a nodular contour, suggesting underlying cirrhosis. No discrete cystic or solid hepatic lesions are confidently identified on today's noncontrast examination. There is some amorphous material lying dependently in the neck of the gallbladder, along with some tiny well-defined filling defects in this region, compatible with a combination of biliary sludge and tiny gallstones. Gallbladder is moderately distended. Gallbladder wall thickness appears normal. No gallbladder wall edema. No pericholecystic fluid or surrounding inflammatory changes. No intra or extrahepatic biliary ductal dilatation noted on MRCP images. Common bile duct measures only 4 mm in the porta hepatis. No definite filling defects within the common bile duct to suggest choledocholithiasis. Pancreas: Several small T1 hypointense, T2 hyperintense lesions are noted in the pancreas, incompletely characterized on today's noncontrast examination, but likely to represent tiny pancreatic pseudocysts or side branch IPMN (intraductal papillary mucinous neoplasm). The largest of these is in  the body of the pancreas (axial image 18 of series 4 and coronal image 24 of series 3) measuring 1.4 x 0.7 x 1.2 cm. Direct communication with the main pancreatic duct can not be confirmed with any of these lesions. Main pancreatic duct is not dilated. No definite solid appearing pancreatic mass confidently identified on today's noncontrast examination. No peripancreatic fluid collections. Spleen: Numerous small T2 hyperintensities are scattered throughout the spleen, nonspecific in incompletely characterized on today's noncontrast examination. Spleen appears mildly enlarged measuring 13.1 x 14.5 x 5.8 cm (estimated splenic volume of 551 mL) . Adrenals/Urinary Tract: Numerous renal lesions bilaterally, incompletely characterized on today's noncontrast examination. The majority of these are T1 hypointense and T2 hyperintense, likely large cysts, measuring up to 9.5 cm extending exophytically from the lower pole of the right kidney. Other lesions demonstrate variable degrees of T1 hyperintensity, some with T2 hypointensity, potentially proteinaceous and/or hemorrhagic cysts, but incompletely characterized, largest of which extends exophytically from the upper pole of the right kidney (axial image 39 of series 17) measuring up to 2.3 cm in diameter. No clearly aggressive appearing renal lesions are noted on today's noncontrast examination. No hydroureteronephrosis in the visualized portions of the abdomen. Bilateral adrenal glands are normal in appearance. Stomach/Bowel: Visualized portions are unremarkable. Vascular/Lymphatic: No aneurysm identified in the visualized abdominal vasculature. No lymphadenopathy noted in the abdomen. Other: No significant volume of ascites  noted in the visualized portions of the peritoneal cavity. Musculoskeletal: No aggressive appearing osseous lesions are noted in the visualized portions of the skeleton. IMPRESSION: 1. Histologic changes in the liver suggesting underlying cirrhosis. No  other explanation for the patient's jaundice identified. Specifically, no choledocholithiasis or signs of biliary tract obstruction. 2. There appears to be a small amount of biliary sludge and tiny gallstones lying dependently in the gallbladder. No imaging findings to suggest an acute cholecystitis are noted at this time. 3. Small pancreatic lesions incompletely characterized on today's noncontrast examination, but favored to represent small pancreatic pseudocysts or side branch IPMN (intraductal papillary mucinous neoplasm). Repeat abdominal MRI with and without IV gadolinium with MRCP (or pancreatic protocol CT scan) should be considered in 1 year to ensure the stability of these findings. This recommendation follows ACR consensus guidelines: Management of Incidental Pancreatic Cysts: A White Paper of the ACR Incidental Findings Committee. J Am Coll Radiol 3244;01:027-253. 4. Numerous tiny T2 hyperintensities scattered throughout the spleen, incompletely characterized on today's noncontrast examination. This could simply represent benign lesions such as small cavernous hemangiomas or lymphangioma is, however, metastatic disease to the spleen is not excluded. Correlation with clinical history is recommended, and consideration for repeat abdominal MRI with and without IV gadolinium could be considered if there is clinical concern for underlying malignancy. 5. Multiple renal lesions bilaterally, incompletely characterized on today's noncontrast examination, but likely to represent a combination of simple and proteinaceous/hemorrhagic cysts. These too could be characterized on follow-up MRI of the abdomen with and without IV gadolinium if of clinical concern. Electronically Signed   By: Vinnie Langton M.D.   On: 12/18/2022 06:12   CT ABDOMEN PELVIS W CONTRAST  Result Date: 12/17/2022 CLINICAL DATA:  Acute pancreatitis, severe painless jaundice. EXAM: CT ABDOMEN AND PELVIS WITH CONTRAST TECHNIQUE: Multidetector  CT imaging of the abdomen and pelvis was performed using the standard protocol following bolus administration of intravenous contrast. RADIATION DOSE REDUCTION: This exam was performed according to the departmental dose-optimization program which includes automated exposure control, adjustment of the mA and/or kV according to patient size and/or use of iterative reconstruction technique. CONTRAST:  137m OMNIPAQUE IOHEXOL 300 MG/ML  SOLN COMPARISON:  Abdominal ultrasound 01/19/2020. Abdominal ultrasound 12/17/2022. FINDINGS: Lower chest: No acute abnormality. The heart is enlarged. There is a small pericardial effusion. Hepatobiliary: No focal liver abnormality is seen. No gallstones, gallbladder wall thickening, or biliary dilatation. Pancreas: There are trace inflammatory changes surrounding the uncinate process of the pancreas. There is no pancreatic ductal dilatation or fluid collection. Spleen: There subcentimeter hypodensities scattered throughout the spleen which may represent small cysts or hemangiomas. Spleen is otherwise within normal limits. Adrenals/Urinary Tract: There are numerous bilateral renal cysts. The largest cyst on the right is in the inferior pole measuring 9.7 cm. The largest cyst on the left is in the inferior pole measuring 7.0 cm. Mildly hyperdense rounded area in the superior pole of the right kidney measures 2.4 cm. There are punctate nonobstructing right renal calculi. There is no hydronephrosis in either kidney. The adrenal glands and bladder are within normal limits. Stomach/Bowel: Stomach is within normal limits. Appendix appears normal. No evidence of bowel wall thickening, distention, or inflammatory changes. There is sigmoid and descending colon diverticulosis. Vascular/Lymphatic: Aortic atherosclerosis. No enlarged abdominal or pelvic lymph nodes. Reproductive: Prostate is unremarkable. Other: There is a small amount of free fluid in the pelvis. There is no focal abdominal wall  hernia. Musculoskeletal: Multilevel degenerative changes affect the spine. IMPRESSION: 1.  Trace inflammatory changes surrounding the uncinate process of the pancreas compatible with acute pancreatitis. No evidence for pancreatic necrosis or fluid collection. 2. Small amount of free fluid in the pelvis. 3. Small pericardial effusion. 4. Numerous bilateral renal cysts. There is a mildly hyperdense rounded area in the superior pole of the right kidney measuring 2.4 cm. This may represent a complex cyst or solid lesion. No follow-up imaging recommended. 5. Nonobstructing right renal calculi. 6. Colonic diverticulosis. 7. There subcentimeter hypodensities scattered throughout the spleen which may represent small cysts or hemangiomas. Aortic Atherosclerosis (ICD10-I70.0). Electronically Signed   By: Ronney Asters M.D.   On: 12/17/2022 21:34   US ABDOMEN LIMITED RUQ (LIVER/GB)  Result Date: 12/17/2022 CLINICAL DATA:  Jaundice EXAM: ULTRASOUND ABDOMEN LIMITED RIGHT UPPER QUADRANT COMPARISON:  Renal ultrasound dated August 02, 2022 FINDINGS: Gallbladder: No gallstones or wall thickening visualized. Sludge seen in the gallbladder. No sonographic Murphy sign noted by sonographer. Common bile duct: Diameter: 3 mm Liver: No focal lesion identified. Within normal limits in parenchymal echogenicity. Portal vein is patent on color Doppler imaging with normal direction of blood flow towards the liver. Other: Incidental note is made of right pleural effusion. Partially visualized simple appearing right renal cysts, largest measures 8.0 cm, see previously performed renal ultrasound for more complete evaluation. IMPRESSION: 1. Sludge seen in the gallbladder. No sonographic evidence of acute cholecystitis. 2. Right pleural effusion. Electronically Signed   By: Yetta Glassman M.D.   On: 12/17/2022 20:20        Scheduled Meds:  aspirin EC  81 mg Oral Daily   insulin aspart  0-15 Units Subcutaneous TID WC   insulin aspart   0-5 Units Subcutaneous QHS   [START ON 12/20/2022] metFORMIN  500 mg Oral BID WC   Continuous Infusions:  sodium chloride 100 mL/hr at 12/19/22 1047   cefTRIAXone (ROCEPHIN)  IV 1 g (12/19/22 1117)     LOS: 2 days       Sidney Ace, MD [facility]   If 7PM-7AM, please contact night-coverage  12/19/2022, 12:19 PM

## 2022-12-19 NOTE — TOC Initial Note (Signed)
Transition of Care [facility]) - Initial/Assessment Note    Patient Details  Name: Vernon [name] MRN: [id number] Date of Birth: 1946-11-21  Transition of Care [facility]) CM/SW Contact:    [name] Sessions, RN Phone Number: 12/19/2022, 3:38 PM  Clinical Narrative:                  Admitted for: Elevated LFT  Admitted from: home with wife  PCP: Edwina Barth  [facility] Current home health/prior home health/DME: NA  Level 6 mobility   Transition of Care (TOC) Screening Note   Patient Details  Name: Vernon [name] Date of Birth: August 07, 1946   Transition of Care [facility]) CM/SW Contact:    [name] Sessions, RN Phone Number: 12/19/2022, 3:50 PM    Transition of Care Department [facility]) has reviewed patient and no TOC needs have been identified at this time. We will continue to monitor patient advancement through interdisciplinary progression rounds. If new patient transition needs arise, please place a TOC consult.          Patient Goals and CMS Choice            Expected Discharge Plan and Services                                              Prior Living Arrangements/Services                       Activities of Daily Living Home Assistive Devices/Equipment: Gilford Rile (specify type) ADL Screening (condition at time of admission) Patient's cognitive ability adequate to safely complete daily activities?: Yes Is the patient deaf or have difficulty hearing?: No Does the patient have difficulty seeing, even when wearing glasses/contacts?: No Does the patient have difficulty concentrating, remembering, or making decisions?: No Patient able to express need for assistance with ADLs?: Yes Does the patient have difficulty dressing or bathing?: No Independently performs ADLs?: Yes (appropriate for developmental age) Does the patient have difficulty walking or climbing stairs?: Yes Weakness of Legs: Both Weakness of Arms/Hands: Both  Permission Sought/Granted                   Emotional Assessment              Admission diagnosis:  Obstructive jaundice [K83.1] Painless jaundice [R17] Patient Active Problem List   Diagnosis Date Noted   Painless jaundice 12/18/2022   Elevated LFTs 12/18/2022   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   Obstructive jaundice 12/17/2022   Atrial fibrillation ([place]) 07/26/2022   Presence of Watchman left atrial appendage closure device 07/26/2022   Proteinuria, unspecified 05/23/2022   Simple renal cyst 05/23/2022   Long term current use of anticoagulant 04/04/2022   Rotator cuff syndrome 04/04/2022   Exposure to potentially hazardous substance 04/04/2022   Gout 04/04/2022   Osteoarthritis 04/04/2022   Osteoporosis 04/04/2022   Other specified diseases of hair and hair follicles 35/32/9924   Pain in joint, lower leg 04/04/2022   Problem related to unspecified psychosocial circumstances 04/04/2022   General medical examination for administrative purposes 04/04/2022   Stage 3b chronic kidney disease ([place])    Dysphagia, post-stroke    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   Cough 26/83/4196   Acute metabolic encephalopathy 22/29/7989   Obstructive hydrocephalus ([place]) 02/19/2022   Dyslipidemia 02/19/2022   Dysphagia 02/19/2022   AKI (acute kidney injury) (  HCC)    Hyperkalemia    Anemia    Pleural effusion on right    Respiratory failure requiring intubation (HCC)    Cerebral edema (HCC)    Chronic atrial fibrillation ([place])    Controlled type 2 diabetes mellitus with hyperglycemia, without long-term current use of insulin (HCC)    ICH (intracerebral hemorrhage) ([place]) 02/10/2022   Intracranial hemorrhage (HCC)    Anticoagulated    Moderate tricuspid regurgitation 07/26/2021   Moderate aortic valve stenosis 03/08/2020   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   Macrocytic anemia 01/13/2020   Spleen enlargement 01/13/2020   Bilateral carotid artery stenosis 07/14/2018   Atrial  fibrillation, chronic ([place]) 07/14/2018   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Phone: [phone]: [phone]    Social Determinants of Health (SDOH) Social History: SDOH Screenings   Food Insecurity: No Food Insecurity (12/18/2022)  Housing: Low Risk  (12/18/2022)  Transportation Needs: No Transportation Needs (12/18/2022)  Utilities: Not At Risk (12/18/2022)  Depression (PHQ2-9): Medium Risk (04/03/2022)  Tobacco Use: Medium Risk (12/17/2022)   SDOH Interventions:     Readmission Risk Interventions    12/19/2022    3:35 PM  Readmission Risk Prevention Plan  Transportation Screening Complete  Social Work Consult for RHazelPlanning/Counseling Complete  Palliative Care Screening Not Applicable  Medication Review (Press photographer Complete

## 2022-12-19 NOTE — Progress Notes (Signed)
Vernon Darby, MD [address]  [place]  [place], [place] [address]  Main: [phone]  Fax: [phone] Pager: [phone]   Subjective: No acute events overnight.  Doing well, working with PT, sitting in the chair, wife is bedside Tolerating diet well   Objective: Vital signs in last 24 hours: Vitals:   12/19/22 0415 12/19/22 0800 12/19/22 1550 12/19/22 1940  BP: (!) 136/95 (!) 162/86 (!) 144/70 138/66  Pulse: 63 74 61 67  Resp: '20 20 20 16  '$ Temp: 97.8 F (36.6 C) (!) 97.4 F (36.3 C) 98.2 F (36.8 C) 98.6 F (37 C)  TempSrc: Oral Oral Oral Oral  SpO2: 95% 98% 99% 98%  Weight:      Height:       Weight change:   Intake/Output Summary (Last 24 hours) at 12/19/2022 1946 Last data filed at 12/19/2022 1845 Gross per 24 hour  Intake 801.44 ml  Output 875 ml  Net -73.56 ml     Exam: Heart:: Regular rate and rhythm or S1S2 present Lungs: normal Abdomen: soft, nontender, normal bowel sounds   Lab Results:    Latest Ref Rng & Units 12/19/2022   10:29 AM 12/18/2022    5:56 AM 12/17/2022    8:41 PM  CBC  WBC 4.0 - 10.5 K/uL 6.0  10.1  13.7   Hemoglobin 13.0 - 17.0 g/dL 8.7  8.7  9.7   Hematocrit 39.0 - 52.0 % 26.7  27.0  29.9   Platelets 150 - 400 K/uL 267  226  246       Latest Ref Rng & Units 12/19/2022   10:29 AM 12/18/2022   10:20 AM 12/18/2022    5:56 AM  CMP  Glucose 70 - 99 mg/dL 130  155    BUN 8 - 23 mg/dL 32  45    Creatinine 0.61 - 1.24 mg/dL 1.32  1.69  1.75   Sodium 135 - 145 mmol/L 138  138    Potassium 3.5 - 5.1 mmol/L 4.2  4.3    Chloride 98 - 111 mmol/L 105  105    CO2 22 - 32 mmol/L 22  21    Calcium 8.9 - 10.3 mg/dL 8.7  8.6    Total Protein 6.5 - 8.1 g/dL 7.5  7.4    Total Bilirubin 0.3 - 1.2 mg/dL 8.3  10.7    Alkaline Phos 38 - 126 U/L 290  263    AST 15 - 41 U/L 53  72    ALT 0 - 44 U/L 65  86      Micro Results: Recent Results (from the past 240 hour(s))  Urine Culture     Status: Abnormal (Preliminary result)    Collection Time: 12/17/22 10:42 PM   Specimen: Urine, Clean Catch  Result Value Ref Range Status   Specimen Description   Final    URINE, CLEAN CATCH Performed at [facility], [address] [place], [place] [address]    Special Requests   Final    NONE Performed at [facility], [address] [place], [place] [address]    Culture (A)  Final    >=100,000 COLONIES/mL ESCHERICHIA COLI SUSCEPTIBILITIES TO FOLLOW Performed at [facility], [address] [place], [place] [address]    Report Status PENDING  Incomplete   Studies/Results: MR ABDOMEN MRCP WO CONTRAST  Result Date: 12/18/2022 CLINICAL DATA:  77 year old male with history of jaundice. EXAM: MRI ABDOMEN WITHOUT CONTRAST  (  INCLUDING MRCP) TECHNIQUE: Multiplanar multisequence MR imaging of the abdomen was performed. Heavily T2-weighted images of the biliary and pancreatic ducts were obtained, and three-dimensional MRCP images were rendered by post processing. COMPARISON:  No prior abdominal MRI. CT of the abdomen and pelvis 12/17/2022. FINDINGS: Comment: Today's study is limited for detection and characterization of visceral and/or vascular lesions by lack of IV gadolinium. Lower chest: Unremarkable. Hepatobiliary: Liver has a nodular contour, suggesting underlying cirrhosis. No discrete cystic or solid hepatic lesions are confidently identified on today's noncontrast examination. There is some amorphous material lying dependently in the neck of the gallbladder, along with some tiny well-defined filling defects in this region, compatible with a combination of biliary sludge and tiny gallstones. Gallbladder is moderately distended. Gallbladder wall thickness appears normal. No gallbladder wall edema. No pericholecystic fluid or surrounding inflammatory changes. No intra or extrahepatic biliary ductal dilatation noted on MRCP images. Common bile duct measures only 4 mm in the porta hepatis. No definite filling  defects within the common bile duct to suggest choledocholithiasis. Pancreas: Several small T1 hypointense, T2 hyperintense lesions are noted in the pancreas, incompletely characterized on today's noncontrast examination, but likely to represent tiny pancreatic pseudocysts or side branch IPMN (intraductal papillary mucinous neoplasm). The largest of these is in the body of the pancreas (axial image 18 of series 4 and coronal image 24 of series 3) measuring 1.4 x 0.7 x 1.2 cm. Direct communication with the main pancreatic duct can not be confirmed with any of these lesions. Main pancreatic duct is not dilated. No definite solid appearing pancreatic mass confidently identified on today's noncontrast examination. No peripancreatic fluid collections. Spleen: Numerous small T2 hyperintensities are scattered throughout the spleen, nonspecific in incompletely characterized on today's noncontrast examination. Spleen appears mildly enlarged measuring 13.1 x 14.5 x 5.8 cm (estimated splenic volume of 551 mL) . Adrenals/Urinary Tract: Numerous renal lesions bilaterally, incompletely characterized on today's noncontrast examination. The majority of these are T1 hypointense and T2 hyperintense, likely large cysts, measuring up to 9.5 cm extending exophytically from the lower pole of the right kidney. Other lesions demonstrate variable degrees of T1 hyperintensity, some with T2 hypointensity, potentially proteinaceous and/or hemorrhagic cysts, but incompletely characterized, largest of which extends exophytically from the upper pole of the right kidney (axial image 39 of series 17) measuring up to 2.3 cm in diameter. No clearly aggressive appearing renal lesions are noted on today's noncontrast examination. No hydroureteronephrosis in the visualized portions of the abdomen. Bilateral adrenal glands are normal in appearance. Stomach/Bowel: Visualized portions are unremarkable. Vascular/Lymphatic: No aneurysm identified in the  visualized abdominal vasculature. No lymphadenopathy noted in the abdomen. Other: No significant volume of ascites noted in the visualized portions of the peritoneal cavity. Musculoskeletal: No aggressive appearing osseous lesions are noted in the visualized portions of the skeleton. IMPRESSION: 1. Histologic changes in the liver suggesting underlying cirrhosis. No other explanation for the patient's jaundice identified. Specifically, no choledocholithiasis or signs of biliary tract obstruction. 2. There appears to be a small amount of biliary sludge and tiny gallstones lying dependently in the gallbladder. No imaging findings to suggest an acute cholecystitis are noted at this time. 3. Small pancreatic lesions incompletely characterized on today's noncontrast examination, but favored to represent small pancreatic pseudocysts or side branch IPMN (intraductal papillary mucinous neoplasm). Repeat abdominal MRI with and without IV gadolinium with MRCP (or pancreatic protocol CT scan) should be considered in 1 year to ensure the stability of these findings. This recommendation follows ACR consensus  guidelines: Management of Incidental Pancreatic Cysts: A White Paper of the ACR Incidental Findings Committee. J Am Coll Radiol 3762;83:151-761. 4. Numerous tiny T2 hyperintensities scattered throughout the spleen, incompletely characterized on today's noncontrast examination. This could simply represent benign lesions such as small cavernous hemangiomas or lymphangioma is, however, metastatic disease to the spleen is not excluded. Correlation with clinical history is recommended, and consideration for repeat abdominal MRI with and without IV gadolinium could be considered if there is clinical concern for underlying malignancy. 5. Multiple renal lesions bilaterally, incompletely characterized on today's noncontrast examination, but likely to represent a combination of simple and proteinaceous/hemorrhagic cysts. These too  could be characterized on follow-up MRI of the abdomen with and without IV gadolinium if of clinical concern. Electronically Signed   By: Vinnie Langton M.D.   On: 12/18/2022 06:12   MR 3D Recon At Scanner  Result Date: 12/18/2022 CLINICAL DATA:  77 year old male with history of jaundice. EXAM: MRI ABDOMEN WITHOUT CONTRAST  (INCLUDING MRCP) TECHNIQUE: Multiplanar multisequence MR imaging of the abdomen was performed. Heavily T2-weighted images of the biliary and pancreatic ducts were obtained, and three-dimensional MRCP images were rendered by post processing. COMPARISON:  No prior abdominal MRI. CT of the abdomen and pelvis 12/17/2022. FINDINGS: Comment: Today's study is limited for detection and characterization of visceral and/or vascular lesions by lack of IV gadolinium. Lower chest: Unremarkable. Hepatobiliary: Liver has a nodular contour, suggesting underlying cirrhosis. No discrete cystic or solid hepatic lesions are confidently identified on today's noncontrast examination. There is some amorphous material lying dependently in the neck of the gallbladder, along with some tiny well-defined filling defects in this region, compatible with a combination of biliary sludge and tiny gallstones. Gallbladder is moderately distended. Gallbladder wall thickness appears normal. No gallbladder wall edema. No pericholecystic fluid or surrounding inflammatory changes. No intra or extrahepatic biliary ductal dilatation noted on MRCP images. Common bile duct measures only 4 mm in the porta hepatis. No definite filling defects within the common bile duct to suggest choledocholithiasis. Pancreas: Several small T1 hypointense, T2 hyperintense lesions are noted in the pancreas, incompletely characterized on today's noncontrast examination, but likely to represent tiny pancreatic pseudocysts or side branch IPMN (intraductal papillary mucinous neoplasm). The largest of these is in the body of the pancreas (axial image 18 of  series 4 and coronal image 24 of series 3) measuring 1.4 x 0.7 x 1.2 cm. Direct communication with the main pancreatic duct can not be confirmed with any of these lesions. Main pancreatic duct is not dilated. No definite solid appearing pancreatic mass confidently identified on today's noncontrast examination. No peripancreatic fluid collections. Spleen: Numerous small T2 hyperintensities are scattered throughout the spleen, nonspecific in incompletely characterized on today's noncontrast examination. Spleen appears mildly enlarged measuring 13.1 x 14.5 x 5.8 cm (estimated splenic volume of 551 mL) . Adrenals/Urinary Tract: Numerous renal lesions bilaterally, incompletely characterized on today's noncontrast examination. The majority of these are T1 hypointense and T2 hyperintense, likely large cysts, measuring up to 9.5 cm extending exophytically from the lower pole of the right kidney. Other lesions demonstrate variable degrees of T1 hyperintensity, some with T2 hypointensity, potentially proteinaceous and/or hemorrhagic cysts, but incompletely characterized, largest of which extends exophytically from the upper pole of the right kidney (axial image 39 of series 17) measuring up to 2.3 cm in diameter. No clearly aggressive appearing renal lesions are noted on today's noncontrast examination. No hydroureteronephrosis in the visualized portions of the abdomen. Bilateral adrenal glands are normal in  appearance. Stomach/Bowel: Visualized portions are unremarkable. Vascular/Lymphatic: No aneurysm identified in the visualized abdominal vasculature. No lymphadenopathy noted in the abdomen. Other: No significant volume of ascites noted in the visualized portions of the peritoneal cavity. Musculoskeletal: No aggressive appearing osseous lesions are noted in the visualized portions of the skeleton. IMPRESSION: 1. Histologic changes in the liver suggesting underlying cirrhosis. No other explanation for the patient's jaundice  identified. Specifically, no choledocholithiasis or signs of biliary tract obstruction. 2. There appears to be a small amount of biliary sludge and tiny gallstones lying dependently in the gallbladder. No imaging findings to suggest an acute cholecystitis are noted at this time. 3. Small pancreatic lesions incompletely characterized on today's noncontrast examination, but favored to represent small pancreatic pseudocysts or side branch IPMN (intraductal papillary mucinous neoplasm). Repeat abdominal MRI with and without IV gadolinium with MRCP (or pancreatic protocol CT scan) should be considered in 1 year to ensure the stability of these findings. This recommendation follows ACR consensus guidelines: Management of Incidental Pancreatic Cysts: A White Paper of the ACR Incidental Findings Committee. J Am Coll Radiol 5009;38:182-993. 4. Numerous tiny T2 hyperintensities scattered throughout the spleen, incompletely characterized on today's noncontrast examination. This could simply represent benign lesions such as small cavernous hemangiomas or lymphangioma is, however, metastatic disease to the spleen is not excluded. Correlation with clinical history is recommended, and consideration for repeat abdominal MRI with and without IV gadolinium could be considered if there is clinical concern for underlying malignancy. 5. Multiple renal lesions bilaterally, incompletely characterized on today's noncontrast examination, but likely to represent a combination of simple and proteinaceous/hemorrhagic cysts. These too could be characterized on follow-up MRI of the abdomen with and without IV gadolinium if of clinical concern. Electronically Signed   By: Vinnie Langton M.D.   On: 12/18/2022 06:12   CT ABDOMEN PELVIS W CONTRAST  Result Date: 12/17/2022 CLINICAL DATA:  Acute pancreatitis, severe painless jaundice. EXAM: CT ABDOMEN AND PELVIS WITH CONTRAST TECHNIQUE: Multidetector CT imaging of the abdomen and pelvis was  performed using the standard protocol following bolus administration of intravenous contrast. RADIATION DOSE REDUCTION: This exam was performed according to the departmental dose-optimization program which includes automated exposure control, adjustment of the mA and/or kV according to patient size and/or use of iterative reconstruction technique. CONTRAST:  132m OMNIPAQUE IOHEXOL 300 MG/ML  SOLN COMPARISON:  Abdominal ultrasound 01/19/2020. Abdominal ultrasound 12/17/2022. FINDINGS: Lower chest: No acute abnormality. The heart is enlarged. There is a small pericardial effusion. Hepatobiliary: No focal liver abnormality is seen. No gallstones, gallbladder wall thickening, or biliary dilatation. Pancreas: There are trace inflammatory changes surrounding the uncinate process of the pancreas. There is no pancreatic ductal dilatation or fluid collection. Spleen: There subcentimeter hypodensities scattered throughout the spleen which may represent small cysts or hemangiomas. Spleen is otherwise within normal limits. Adrenals/Urinary Tract: There are numerous bilateral renal cysts. The largest cyst on the right is in the inferior pole measuring 9.7 cm. The largest cyst on the left is in the inferior pole measuring 7.0 cm. Mildly hyperdense rounded area in the superior pole of the right kidney measures 2.4 cm. There are punctate nonobstructing right renal calculi. There is no hydronephrosis in either kidney. The adrenal glands and bladder are within normal limits. Stomach/Bowel: Stomach is within normal limits. Appendix appears normal. No evidence of bowel wall thickening, distention, or inflammatory changes. There is sigmoid and descending colon diverticulosis. Vascular/Lymphatic: Aortic atherosclerosis. No enlarged abdominal or pelvic lymph nodes. Reproductive: Prostate is unremarkable. Other: There  is a small amount of free fluid in the pelvis. There is no focal abdominal wall hernia. Musculoskeletal: Multilevel  degenerative changes affect the spine. IMPRESSION: 1. Trace inflammatory changes surrounding the uncinate process of the pancreas compatible with acute pancreatitis. No evidence for pancreatic necrosis or fluid collection. 2. Small amount of free fluid in the pelvis. 3. Small pericardial effusion. 4. Numerous bilateral renal cysts. There is a mildly hyperdense rounded area in the superior pole of the right kidney measuring 2.4 cm. This may represent a complex cyst or solid lesion. No follow-up imaging recommended. 5. Nonobstructing right renal calculi. 6. Colonic diverticulosis. 7. There subcentimeter hypodensities scattered throughout the spleen which may represent small cysts or hemangiomas. Aortic Atherosclerosis (ICD10-I70.0). Electronically Signed   By: Ronney Asters M.D.   On: 12/17/2022 21:34   US ABDOMEN LIMITED RUQ (LIVER/GB)  Result Date: 12/17/2022 CLINICAL DATA:  Jaundice EXAM: ULTRASOUND ABDOMEN LIMITED RIGHT UPPER QUADRANT COMPARISON:  Renal ultrasound dated August 02, 2022 FINDINGS: Gallbladder: No gallstones or wall thickening visualized. Sludge seen in the gallbladder. No sonographic Murphy sign noted by sonographer. Common bile duct: Diameter: 3 mm Liver: No focal lesion identified. Within normal limits in parenchymal echogenicity. Portal vein is patent on color Doppler imaging with normal direction of blood flow towards the liver. Other: Incidental note is made of right pleural effusion. Partially visualized simple appearing right renal cysts, largest measures 8.0 cm, see previously performed renal ultrasound for more complete evaluation. IMPRESSION: 1. Sludge seen in the gallbladder. No sonographic evidence of acute cholecystitis. 2. Right pleural effusion. Electronically Signed   By: Yetta Glassman M.D.   On: 12/17/2022 20:20   Medications: I have reviewed the patient's current medications. Prior to Admission:  Facility-Administered Medications Prior to Admission  Medication Dose  Route Frequency Provider Last Rate Last Admin   0.9 %  sodium chloride infusion   Intravenous Continuous Vickie Epley, MD       0.9 %  sodium chloride infusion   Intravenous Continuous Vickie Epley, MD       Medications Prior to Admission  Medication Sig Dispense Refill Last Dose   acetaminophen (TYLENOL) 650 MG CR tablet Take 650 mg by mouth every 8 (eight) hours as needed for pain.   prn at prn   aspirin EC 81 MG tablet Take 1 tablet (81 mg total) by mouth daily. Swallow whole. STOP ELIQUIS ON 10/15 AND START ASPIRIN 81 MG ON 10/16 90 tablet 3 12/17/2022 at 1100   metFORMIN (GLUCOPHAGE) 500 MG tablet Take 500 mg by mouth 2 (two) times daily with a meal.   12/17/2022 at 1100   Multiple Vitamin (MULTI-VITAMIN) tablet Take 1 tablet by mouth daily.   12/17/2022 at 1100   ondansetron (ZOFRAN-ODT) 4 MG disintegrating tablet Take 4 mg by mouth every 8 (eight) hours as needed.   prn at prn   polyethylene glycol powder (GLYCOLAX/MIRALAX) 17 GM/SCOOP powder Take 1 Container by mouth daily as needed.   prn at prn   Scheduled:  aspirin EC  81 mg Oral Daily   insulin aspart  0-15 Units Subcutaneous TID WC   insulin aspart  0-5 Units Subcutaneous QHS   [START ON 12/20/2022] metFORMIN  500 mg Oral BID WC   Continuous:  sodium chloride 100 mL/hr at 12/19/22 1047   cefTRIAXone (ROCEPHIN)  IV 1 g (12/19/22 1117)   ZOX:[id number] (DILAUDID) injection, ondansetron **OR** ondansetron (ZOFRAN) IV, traZODone Anti-infectives (From admission, onward)    Start     Dose/Rate  Route Frequency Ordered Stop   12/18/22 1200  cefTRIAXone (ROCEPHIN) 1 g in sodium chloride 0.9 % 100 mL IVPB        1 g 200 mL/hr over 30 Minutes Intravenous Every 24 hours 12/18/22 1100     12/17/22 2200  cefTRIAXone (ROCEPHIN) 1 g in sodium chloride 0.9 % 100 mL IVPB        1 g 200 mL/hr over 30 Minutes Intravenous  Once 12/17/22 2147 12/17/22 2238      Scheduled Meds:  aspirin EC  81 mg Oral Daily   insulin aspart   0-15 Units Subcutaneous TID WC   insulin aspart  0-5 Units Subcutaneous QHS   [START ON 12/20/2022] metFORMIN  500 mg Oral BID WC   Continuous Infusions:  sodium chloride 100 mL/hr at 12/19/22 1047   cefTRIAXone (ROCEPHIN)  IV 1 g (12/19/22 1117)   PRN Meds:.HYDROmorphone (DILAUDID) injection, ondansetron **OR** ondansetron (ZOFRAN) IV, traZODone   Assessment: Principal Problem:   Obstructive jaundice Active Problems:   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HCC)   Essential hypertension   Hyperlipemia, mixed   Chronic atrial fibrillation (HCC)   Controlled type 2 diabetes mellitus with hyperglycemia, without long-term current use of insulin (HCC)   Dyslipidemia   Presence of Watchman left atrial appendage closure device   Painless jaundice   Elevated LFTs   Cholestatic hepatitis  Vernon [name] is a 77 y.o. male with history of A-fib s/p watchman's procedure, diabetes well-controlled, maintained on metformin is admitted with cholestatic hepatitis and UTI   Plan: Cholestatic hepatitis: Likely secondary to sepsis from UTI secondary to E. coli with possible underlying chronic liver disease LFTs continue to improve No evidence of biliary obstruction based on ultrasound, CT and MRCP Mildly elevated lipase and his clinical presentation is not consistent with acute pancreatitis Does not appear to have acute cholecystitis Patient did have intermittent mildly elevated alkaline phosphatase along with total bilirubin within last 1 to 2 years He has chronically elevated total bilirubin which was mild until this admission.  It is predominantly direct bilirubin which is consistent with cholestasis rather than hemolysis Patient also has chronically elevated ferritin above 500 and % iron saturation above 40 which raises suspicion for possibility of underlying chronic liver disease from hemochromatosis No evidence of portal hypertension Acute worsening of LFTs are likely in the setting of UTI and  possibility of progression of underlying liver disease Also, indeterminate lesions in the pancreas and spleen on noncontrast MRI may or may not be clinically relevant Recommend MRI abdomen with and without contrast if renal function is permissible Ordered comprehensive secondary liver disease workup, acute viral hepatitis panel is negative Discussed with patient that he may need liver biopsy based on the secondary liver disease workup, this does not need to be performed as inpatient Follow-up with me in 2 to 3 weeks after discharge Avoid hepatotoxic agents  UTI, urine cultures positive for E. Coli Currently on ceftriaxone Consider blood cultures as well Encouraged adequate hydration  Will follow patient peripherally, please call GI with any questions or concerns   LOS: 2 days   Gwenn Teodoro 12/19/2022, 7:46 PM

## 2022-12-20 ENCOUNTER — Other Ambulatory Visit: Payer: Self-pay

## 2022-12-20 DIAGNOSIS — K831 Obstruction of bile duct: Secondary | ICD-10-CM | POA: Diagnosis not present

## 2022-12-20 DIAGNOSIS — R7989 Other specified abnormal findings of blood chemistry: Secondary | ICD-10-CM

## 2022-12-20 LAB — URINE CULTURE: Culture: >=100000 — AB

## 2022-12-20 LAB — ANA W/REFLEX IF POSITIVE: Anti Nuclear Antibody (ANA): NEGATIVE

## 2022-12-20 LAB — GLUCOSE, CAPILLARY: Glucose-Capillary: 118 mg/dL — ABNORMAL HIGH (ref 70–99)

## 2022-12-20 MED ORDER — CIPROFLOXACIN HCL 500 MG PO TABS: 500.0000 mg | ORAL_TABLET | Freq: Two times a day (BID) | ORAL | 0 refills | Status: AC

## 2022-12-20 MED ORDER — CIPROFLOXACIN HCL 500 MG PO TABS
500.0000 mg | ORAL_TABLET | Freq: Two times a day (BID) | ORAL | Status: DC
  Administered 2022-12-20: 500 mg via ORAL
  Filled 2022-12-20: qty 1

## 2022-12-20 NOTE — Progress Notes (Signed)
Pt discharged per MD order. IV removed. Discharge instructions reviewed with pt. Pt verbalized understanding. All questions answered to pt satisfaction. Pt taken out in wheelchair by staff.

## 2022-12-20 NOTE — Discharge Summary (Signed)
Physician Discharge Summary  Vernon [name]:[id number] DOB: 29-Apr-1946 DOA: 12/17/2022  PCP: Baxter Hire, MD  Admit date: 12/17/2022 Discharge date: 12/20/2022  Admitted From: Home  Disposition:  Home  Recommendations for Outpatient Follow-up:  Follow up with PCP in 1-2 weeks Follow up 2 weeks with GI  Home Health:No  Equipment/Devices:None   Discharge Condition:Stable  CODE STATUS:FULL  Diet recommendation: Reg  Brief/Interim Summary: 77 y.o. male with medical history significant of anemia, GERD, essential hypertension, myelodysplastic disease, history of watchman procedure, aortic stenosis, type 2 diabetes, paroxysmal atrial fibrillation presented for evaluation of jaundice from the [place] [facility].  On presentation, bilirubin was more than 10.  CT of abdomen and pelvis showed peripancreatic inflammation suspicious for possible acute pancreatitis.  GI was consulted.    Patient has downtrending LFTs at time of discharge.  Extensive acute hepatitis panel has been sent and currently pending at time of discharge.  Patient appropriate for DC at this time.  Will follow-up with PCP in 1 week for repeat lab work.  Will follow-up with GI in 2 weeks for discussion of laboratory evaluation and discussion regarding need for liver biopsy.    Discharge Diagnoses:  Principal Problem:   Obstructive jaundice Active Problems:   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HCC)   Chronic atrial fibrillation (HCC)   Essential hypertension   Controlled type 2 diabetes mellitus with hyperglycemia, without long-term current use of insulin (HCC)   Dyslipidemia   Hyperlipemia, mixed   Presence of Watchman left atrial appendage closure device   Painless jaundice   Elevated LFTs   Cholestatic hepatitis   Urinary tract infection without hematuria Elevated LFTs including elevated bilirubin ?  Acute pancreatitis -Presented with elevated LFTs and elevated bilirubin: Bilirubin 13.1 on presentation.  Improving  to 10.7.  LFTs slightly improving as well.  Repeat a.m. labs. -Lipase only 54 on presentation but CT of the abdomen suggestive of acute pancreatitis: Abdominal exam is benign with no tenderness.  MRCP negative for CBD stones or biliary masses Plan: No clinical evidence of acute pancreatitis at time of discharge.  Patient tolerating p.o. intake.  Downtrending LFTs.  Laboratory workup sent and pending at time of this note.  Will follow-up with GI in 2 weeks for further discussion of laboratory investigation and need for further diagnostic workup   Possible UTI: Present on admission Received Rocephin.  Pansensitive E. coli.  Will transition to ciprofloxacin to complete antibiotic course.   Leukocytosis -Resolved   Chronic A-fib -Status post Watchman procedure.  Not on anticoagulation as an outpatient currently   AKI on CKD stage IIIa -creatinine 1.75 on presentation.  Improving to 1.16.   Hyperlipidemia Resume statin on discharge   Essential hypertension -Blood pressure intermittently elevated.  Monitor.  Resume home regimen once able to take orally   Diabetes mellitus type 2 -Continue CBGs with SSI   Normocytic anemia/anemia of chronic disease -From kidney disease.  Hemoglobin stable.   Obesity -Outpatient follow-up   Discharge Instructions  Discharge Instructions     Diet - low sodium heart healthy   Complete by: As directed    Increase activity slowly   Complete by: As directed       Allergies as of 12/20/2022   No Known Allergies      Medication List     STOP taking these medications    acetaminophen 650 MG CR tablet Commonly known as: TYLENOL       TAKE these medications    aspirin EC 81 MG tablet  Take 1 tablet (81 mg total) by mouth daily. Swallow whole. STOP ELIQUIS ON 10/15 AND START ASPIRIN 81 MG ON 10/16   ciprofloxacin 500 MG tablet Commonly known as: CIPRO Take 1 tablet (500 mg total) by mouth 2 (two) times daily for 5 days.   metFORMIN 500  MG tablet Commonly known as: GLUCOPHAGE Take 500 mg by mouth 2 (two) times daily with a meal.   Multi-Vitamin tablet Take 1 tablet by mouth daily.   ondansetron 4 MG disintegrating tablet Commonly known as: ZOFRAN-ODT Take 4 mg by mouth every 8 (eight) hours as needed.   polyethylene glycol powder 17 GM/SCOOP powder Commonly known as: GLYCOLAX/MIRALAX Take 1 Container by mouth daily as needed.        Follow-up Information     Baxter Hire, MD. Schedule an appointment as soon as possible for a visit in 1 week(s).   Specialty: Internal Medicine Why: You need to get repeat liver function test (CMP) in 1 week.  Can either go to [facility] (order is placed by Dr. Marius Ditch) or contact your PCP Dr. Edwina Barth for assistance Contact information: [place] [address] [phone]         Lin Landsman, MD. Schedule an appointment as soon as possible for a visit in 2 week(s).   Specialty: Gastroenterology Why: February 13 1:15 pm. Contact information: [place] [address] [phone]                No Known Allergies  Consultations: GI   Procedures/Studies: MR ABDOMEN MRCP WO CONTRAST  Result Date: 12/18/2022 CLINICAL DATA:  77 year old male with history of jaundice. EXAM: MRI ABDOMEN WITHOUT CONTRAST  (INCLUDING MRCP) TECHNIQUE: Multiplanar multisequence MR imaging of the abdomen was performed. Heavily T2-weighted images of the biliary and pancreatic ducts were obtained, and three-dimensional MRCP images were rendered by post processing. COMPARISON:  No prior abdominal MRI. CT of the abdomen and pelvis 12/17/2022. FINDINGS: Comment: Today's study is limited for detection and characterization of visceral and/or vascular lesions by lack of IV gadolinium. Lower chest: Unremarkable. Hepatobiliary: Liver has a nodular contour, suggesting underlying cirrhosis. No discrete cystic or solid hepatic lesions are confidently identified  on today's noncontrast examination. There is some amorphous material lying dependently in the neck of the gallbladder, along with some tiny well-defined filling defects in this region, compatible with a combination of biliary sludge and tiny gallstones. Gallbladder is moderately distended. Gallbladder wall thickness appears normal. No gallbladder wall edema. No pericholecystic fluid or surrounding inflammatory changes. No intra or extrahepatic biliary ductal dilatation noted on MRCP images. Common bile duct measures only 4 mm in the porta hepatis. No definite filling defects within the common bile duct to suggest choledocholithiasis. Pancreas: Several small T1 hypointense, T2 hyperintense lesions are noted in the pancreas, incompletely characterized on today's noncontrast examination, but likely to represent tiny pancreatic pseudocysts or side branch IPMN (intraductal papillary mucinous neoplasm). The largest of these is in the body of the pancreas (axial image 18 of series 4 and coronal image 24 of series 3) measuring 1.4 x 0.7 x 1.2 cm. Direct communication with the main pancreatic duct can not be confirmed with any of these lesions. Main pancreatic duct is not dilated. No definite solid appearing pancreatic mass confidently identified on today's noncontrast examination. No peripancreatic fluid collections. Spleen: Numerous small T2 hyperintensities are scattered throughout the spleen, nonspecific in incompletely characterized on today's noncontrast examination. Spleen appears mildly enlarged measuring 13.1 x 14.5  x 5.8 cm (estimated splenic volume of 551 mL) . Adrenals/Urinary Tract: Numerous renal lesions bilaterally, incompletely characterized on today's noncontrast examination. The majority of these are T1 hypointense and T2 hyperintense, likely large cysts, measuring up to 9.5 cm extending exophytically from the lower pole of the right kidney. Other lesions demonstrate variable degrees of T1 hyperintensity,  some with T2 hypointensity, potentially proteinaceous and/or hemorrhagic cysts, but incompletely characterized, largest of which extends exophytically from the upper pole of the right kidney (axial image 39 of series 17) measuring up to 2.3 cm in diameter. No clearly aggressive appearing renal lesions are noted on today's noncontrast examination. No hydroureteronephrosis in the visualized portions of the abdomen. Bilateral adrenal glands are normal in appearance. Stomach/Bowel: Visualized portions are unremarkable. Vascular/Lymphatic: No aneurysm identified in the visualized abdominal vasculature. No lymphadenopathy noted in the abdomen. Other: No significant volume of ascites noted in the visualized portions of the peritoneal cavity. Musculoskeletal: No aggressive appearing osseous lesions are noted in the visualized portions of the skeleton. IMPRESSION: 1. Histologic changes in the liver suggesting underlying cirrhosis. No other explanation for the patient's jaundice identified. Specifically, no choledocholithiasis or signs of biliary tract obstruction. 2. There appears to be a small amount of biliary sludge and tiny gallstones lying dependently in the gallbladder. No imaging findings to suggest an acute cholecystitis are noted at this time. 3. Small pancreatic lesions incompletely characterized on today's noncontrast examination, but favored to represent small pancreatic pseudocysts or side branch IPMN (intraductal papillary mucinous neoplasm). Repeat abdominal MRI with and without IV gadolinium with MRCP (or pancreatic protocol CT scan) should be considered in 1 year to ensure the stability of these findings. This recommendation follows ACR consensus guidelines: Management of Incidental Pancreatic Cysts: A White Paper of the ACR Incidental Findings Committee. J Am Coll Radiol 1610;96:045-409. 4. Numerous tiny T2 hyperintensities scattered throughout the spleen, incompletely characterized on today's noncontrast  examination. This could simply represent benign lesions such as small cavernous hemangiomas or lymphangioma is, however, metastatic disease to the spleen is not excluded. Correlation with clinical history is recommended, and consideration for repeat abdominal MRI with and without IV gadolinium could be considered if there is clinical concern for underlying malignancy. 5. Multiple renal lesions bilaterally, incompletely characterized on today's noncontrast examination, but likely to represent a combination of simple and proteinaceous/hemorrhagic cysts. These too could be characterized on follow-up MRI of the abdomen with and without IV gadolinium if of clinical concern. Electronically Signed   By: Vinnie Langton M.D.   On: 12/18/2022 06:12   MR 3D Recon At Scanner  Result Date: 12/18/2022 CLINICAL DATA:  76 year old male with history of jaundice. EXAM: MRI ABDOMEN WITHOUT CONTRAST  (INCLUDING MRCP) TECHNIQUE: Multiplanar multisequence MR imaging of the abdomen was performed. Heavily T2-weighted images of the biliary and pancreatic ducts were obtained, and three-dimensional MRCP images were rendered by post processing. COMPARISON:  No prior abdominal MRI. CT of the abdomen and pelvis 12/17/2022. FINDINGS: Comment: Today's study is limited for detection and characterization of visceral and/or vascular lesions by lack of IV gadolinium. Lower chest: Unremarkable. Hepatobiliary: Liver has a nodular contour, suggesting underlying cirrhosis. No discrete cystic or solid hepatic lesions are confidently identified on today's noncontrast examination. There is some amorphous material lying dependently in the neck of the gallbladder, along with some tiny well-defined filling defects in this region, compatible with a combination of biliary sludge and tiny gallstones. Gallbladder is moderately distended. Gallbladder wall thickness appears normal. No gallbladder wall edema. No pericholecystic  fluid or surrounding inflammatory  changes. No intra or extrahepatic biliary ductal dilatation noted on MRCP images. Common bile duct measures only 4 mm in the porta hepatis. No definite filling defects within the common bile duct to suggest choledocholithiasis. Pancreas: Several small T1 hypointense, T2 hyperintense lesions are noted in the pancreas, incompletely characterized on today's noncontrast examination, but likely to represent tiny pancreatic pseudocysts or side branch IPMN (intraductal papillary mucinous neoplasm). The largest of these is in the body of the pancreas (axial image 18 of series 4 and coronal image 24 of series 3) measuring 1.4 x 0.7 x 1.2 cm. Direct communication with the main pancreatic duct can not be confirmed with any of these lesions. Main pancreatic duct is not dilated. No definite solid appearing pancreatic mass confidently identified on today's noncontrast examination. No peripancreatic fluid collections. Spleen: Numerous small T2 hyperintensities are scattered throughout the spleen, nonspecific in incompletely characterized on today's noncontrast examination. Spleen appears mildly enlarged measuring 13.1 x 14.5 x 5.8 cm (estimated splenic volume of 551 mL) . Adrenals/Urinary Tract: Numerous renal lesions bilaterally, incompletely characterized on today's noncontrast examination. The majority of these are T1 hypointense and T2 hyperintense, likely large cysts, measuring up to 9.5 cm extending exophytically from the lower pole of the right kidney. Other lesions demonstrate variable degrees of T1 hyperintensity, some with T2 hypointensity, potentially proteinaceous and/or hemorrhagic cysts, but incompletely characterized, largest of which extends exophytically from the upper pole of the right kidney (axial image 39 of series 17) measuring up to 2.3 cm in diameter. No clearly aggressive appearing renal lesions are noted on today's noncontrast examination. No hydroureteronephrosis in the visualized portions of the  abdomen. Bilateral adrenal glands are normal in appearance. Stomach/Bowel: Visualized portions are unremarkable. Vascular/Lymphatic: No aneurysm identified in the visualized abdominal vasculature. No lymphadenopathy noted in the abdomen. Other: No significant volume of ascites noted in the visualized portions of the peritoneal cavity. Musculoskeletal: No aggressive appearing osseous lesions are noted in the visualized portions of the skeleton. IMPRESSION: 1. Histologic changes in the liver suggesting underlying cirrhosis. No other explanation for the patient's jaundice identified. Specifically, no choledocholithiasis or signs of biliary tract obstruction. 2. There appears to be a small amount of biliary sludge and tiny gallstones lying dependently in the gallbladder. No imaging findings to suggest an acute cholecystitis are noted at this time. 3. Small pancreatic lesions incompletely characterized on today's noncontrast examination, but favored to represent small pancreatic pseudocysts or side branch IPMN (intraductal papillary mucinous neoplasm). Repeat abdominal MRI with and without IV gadolinium with MRCP (or pancreatic protocol CT scan) should be considered in 1 year to ensure the stability of these findings. This recommendation follows ACR consensus guidelines: Management of Incidental Pancreatic Cysts: A White Paper of the ACR Incidental Findings Committee. J Am Coll Radiol 5009;38:182-993. 4. Numerous tiny T2 hyperintensities scattered throughout the spleen, incompletely characterized on today's noncontrast examination. This could simply represent benign lesions such as small cavernous hemangiomas or lymphangioma is, however, metastatic disease to the spleen is not excluded. Correlation with clinical history is recommended, and consideration for repeat abdominal MRI with and without IV gadolinium could be considered if there is clinical concern for underlying malignancy. 5. Multiple renal lesions bilaterally,  incompletely characterized on today's noncontrast examination, but likely to represent a combination of simple and proteinaceous/hemorrhagic cysts. These too could be characterized on follow-up MRI of the abdomen with and without IV gadolinium if of clinical concern. Electronically Signed   By: Mauri Brooklyn.D.  On: 12/18/2022 06:12   CT ABDOMEN PELVIS W CONTRAST  Result Date: 12/17/2022 CLINICAL DATA:  Acute pancreatitis, severe painless jaundice. EXAM: CT ABDOMEN AND PELVIS WITH CONTRAST TECHNIQUE: Multidetector CT imaging of the abdomen and pelvis was performed using the standard protocol following bolus administration of intravenous contrast. RADIATION DOSE REDUCTION: This exam was performed according to the departmental dose-optimization program which includes automated exposure control, adjustment of the mA and/or kV according to patient size and/or use of iterative reconstruction technique. CONTRAST:  160m OMNIPAQUE IOHEXOL 300 MG/ML  SOLN COMPARISON:  Abdominal ultrasound 01/19/2020. Abdominal ultrasound 12/17/2022. FINDINGS: Lower chest: No acute abnormality. The heart is enlarged. There is a small pericardial effusion. Hepatobiliary: No focal liver abnormality is seen. No gallstones, gallbladder wall thickening, or biliary dilatation. Pancreas: There are trace inflammatory changes surrounding the uncinate process of the pancreas. There is no pancreatic ductal dilatation or fluid collection. Spleen: There subcentimeter hypodensities scattered throughout the spleen which may represent small cysts or hemangiomas. Spleen is otherwise within normal limits. Adrenals/Urinary Tract: There are numerous bilateral renal cysts. The largest cyst on the right is in the inferior pole measuring 9.7 cm. The largest cyst on the left is in the inferior pole measuring 7.0 cm. Mildly hyperdense rounded area in the superior pole of the right kidney measures 2.4 cm. There are punctate nonobstructing right renal  calculi. There is no hydronephrosis in either kidney. The adrenal glands and bladder are within normal limits. Stomach/Bowel: Stomach is within normal limits. Appendix appears normal. No evidence of bowel wall thickening, distention, or inflammatory changes. There is sigmoid and descending colon diverticulosis. Vascular/Lymphatic: Aortic atherosclerosis. No enlarged abdominal or pelvic lymph nodes. Reproductive: Prostate is unremarkable. Other: There is a small amount of free fluid in the pelvis. There is no focal abdominal wall hernia. Musculoskeletal: Multilevel degenerative changes affect the spine. IMPRESSION: 1. Trace inflammatory changes surrounding the uncinate process of the pancreas compatible with acute pancreatitis. No evidence for pancreatic necrosis or fluid collection. 2. Small amount of free fluid in the pelvis. 3. Small pericardial effusion. 4. Numerous bilateral renal cysts. There is a mildly hyperdense rounded area in the superior pole of the right kidney measuring 2.4 cm. This may represent a complex cyst or solid lesion. No follow-up imaging recommended. 5. Nonobstructing right renal calculi. 6. Colonic diverticulosis. 7. There subcentimeter hypodensities scattered throughout the spleen which may represent small cysts or hemangiomas. Aortic Atherosclerosis (ICD10-I70.0). Electronically Signed   By: ARonney AstersM.D.   On: 12/17/2022 21:34   [place]ABDOM[place] LIMITED RUQ (LIVER/GB)  Result Date: 12/17/2022 CLINICAL DATA:  Jaundice EXAM: ULTRASOUND ABDOMEN LIMITED RIGHT UPPER QUADRANT COMPARISON:  Renal ultrasound dated August 02, 2022 FINDINGS: Gallbladder: No gallstones or wall thickening visualized. Sludge seen in the gallbladder. No sonographic Murphy sign noted by sonographer. Common bile duct: Diameter: 3 mm Liver: No focal lesion identified. Within normal limits in parenchymal echogenicity. Portal vein is patent on color Doppler imaging with normal direction of blood flow towards the liver.  Other: Incidental note is made of right pleural effusion. Partially visualized simple appearing right renal cysts, largest measures 8.0 cm, see previously performed renal ultrasound for more complete evaluation. IMPRESSION: 1. Sludge seen in the gallbladder. No sonographic evidence of acute cholecystitis. 2. Right pleural effusion. Electronically Signed   By: LYetta GlassmanM.D.   On: 12/17/2022 20:20      Subjective: Patient seen and examined at time of discharge.  Stable no distress.  Appropriate for discharge home.  Discharge Exam:  Vitals:   12/20/22 0316 12/20/22 0817  BP: (!) 116/59 138/82  Pulse: (!) 57 (!) 59  Resp: 20 20  Temp: 98.4 F (36.9 C) 98 F (36.7 C)  SpO2: 94% 96%   Vitals:   12/19/22 1550 12/19/22 1940 12/20/22 0316 12/20/22 0817  BP: (!) 144/70 138/66 (!) 116/59 138/82  Pulse: 61 67 (!) 57 (!) 59  Resp: '20 16 20 20  '$ Temp: 98.2 F (36.8 C) 98.6 F (37 C) 98.4 F (36.9 C) 98 F (36.7 C)  TempSrc: Oral Oral Oral Oral  SpO2: 99% 98% 94% 96%  Weight:      Height:        General: Pt is alert, awake, not in acute distress Cardiovascular: RRR, S1/S2 +, no rubs, no gallops Respiratory: CTA bilaterally, no wheezing, no rhonchi Abdominal: Soft, NT, ND, bowel sounds + Extremities: no edema, no cyanosis    The results of significant diagnostics from this hospitalization (including imaging, microbiology, ancillary and laboratory) are listed below for reference.     Microbiology: Recent Results (from the past 240 hour(s))  Urine Culture     Status: Abnormal   Collection Time: 12/17/22 10:42 PM   Specimen: Urine, Clean Catch  Result Value Ref Range Status   Specimen Description   Final    URINE, CLEAN CATCH Performed at [facility], [address] [place], [place] [address]    Special Requests   Final    NONE Performed at [facility], [address] [place], [place] [address]    Culture >=100,000 COLONIES/mL ESCHERICHIA COLI (A)   Final   Report Status 12/20/2022 FINAL  Final   Organism ID, Bacteria ESCHERICHIA COLI (A)  Final      Susceptibility   Escherichia coli - MIC*    AMPICILLIN <=2 SENSITIVE Sensitive     CEFAZOLIN <=4 SENSITIVE Sensitive     CEFEPIME <=0.12 SENSITIVE Sensitive     CEFTRIAXONE <=0.25 SENSITIVE Sensitive     CIPROFLOXACIN <=0.25 SENSITIVE Sensitive     GENTAMICIN <=1 SENSITIVE Sensitive     IMIPENEM <=0.25 SENSITIVE Sensitive     NITROFURANTOIN <=16 SENSITIVE Sensitive     TRIMETH/SULFA <=20 SENSITIVE Sensitive     AMPICILLIN/SULBACTAM <=2 SENSITIVE Sensitive     PIP/TAZO <=4 SENSITIVE Sensitive     * >=100,000 COLONIES/mL ESCHERICHIA COLI     Labs: BNP (last 3 results) Recent Labs    02/12/22 0415  BNP 295.2*   Basic Metabolic Panel: Recent Labs  Lab 12/17/22 2041 12/18/22 0556 12/18/22 1020 12/19/22 1029  NA 135  --  138 138  K 4.5  --  4.3 4.2  CL 100  --  105 105  CO2 23  --  21* 22  GLUCOSE 113*  --  155* 130*  BUN 47*  --  45* 32*  CREATININE 1.75* 1.75* 1.69* 1.32*  CALCIUM 9.3  --  8.6* 8.7*  MG  --   --   --  1.6*   Liver Function Tests: Recent Labs  Lab 12/17/22 2041 12/18/22 1020 12/19/22 1029  AST 102* 72* 53*  ALT 105* 86* 65*  ALKPHOS 284* 263* 290*  BILITOT 13.1* 10.7* 8.3*  PROT 8.3* 7.4 7.5  ALBUMIN 4.4 3.8 3.8   Recent Labs  Lab 12/17/22 2041  LIPASE 54*   No results for input(s): "AMMONIA" in the last 168 hours. CBC: Recent Labs  Lab 12/17/22 2041 12/18/22 0556 12/19/22 1029  WBC 13.7* 10.1 6.0  NEUTROABS  --   --  4.5  HGB 9.7* 8.7* 8.7*  HCT 29.9* 27.0* 26.7*  MCV 95.2 95.4 94.3  PLT 246 226 267   Cardiac Enzymes: No results for input(s): "CKTOTAL", "CKMB", "CKMBINDEX", "TROPONINI" in the last 168 hours. BNP: Invalid input(s): "POCBNP" CBG: Recent Labs  Lab 12/19/22 0745 12/19/22 1209 12/19/22 1632 12/19/22 2047 12/20/22 0802  GLUCAP 134* 122* 119* 175* 118*   D-Dimer No results for input(s): "DDIMER" in the  last 72 hours. Hgb A1c Recent Labs    12/18/22 0556  HGBA1C 5.7*   Lipid Profile Recent Labs    12/19/22 1029  CHOL 187  HDL 25*  LDLCALC 136*  TRIG 128  CHOLHDL 7.5   Thyroid function studies No results for input(s): "TSH", "T4TOTAL", "T3FREE", "THYROIDAB" in the last 72 hours.  Invalid input(s): "FREET3" Anemia work up No results for input(s): "VITAMINB12", "FOLATE", "FERRITIN", "TIBC", "IRON", "RETICCTPCT" in the last 72 hours. Urinalysis    Component Value Date/Time   COLORURINE YELLOW 03/16/2022 0925   APPEARANCEUR CLOUDY (A) 03/16/2022 0925   LABSPEC 1.011 03/16/2022 0925   PHURINE 6.0 03/16/2022 0925   GLUCOSEU NEGATIVE 03/16/2022 0925   HGBUR NEGATIVE 03/16/2022 0925   BILIRUBINUR NEGATIVE 03/16/2022 0925   KETONESUR NEGATIVE 03/16/2022 0925   PROTEINUR NEGATIVE 03/16/2022 0925   NITRITE POSITIVE (A) 03/16/2022 0925   LEUKOCYTESUR LARGE (A) 03/16/2022 0925   Sepsis Labs Recent Labs  Lab 12/17/22 2041 12/18/22 0556 12/19/22 1029  WBC 13.7* 10.1 6.0   Microbiology Recent Results (from the past 240 hour(s))  Urine Culture     Status: Abnormal   Collection Time: 12/17/22 10:42 PM   Specimen: Urine, Clean Catch  Result Value Ref Range Status   Specimen Description   Final    URINE, CLEAN CATCH Performed at [facility], [address] [place], [place] [address]    Special Requests   Final    NONE Performed at [facility], [address] [place], [place] [address]    Culture >=100,000 COLONIES/mL ESCHERICHIA COLI (A)  Final   Report Status 12/20/2022 FINAL  Final   Organism ID, Bacteria ESCHERICHIA COLI (A)  Final      Susceptibility   Escherichia coli - MIC*    AMPICILLIN <=2 SENSITIVE Sensitive     CEFAZOLIN <=4 SENSITIVE Sensitive     CEFEPIME <=0.12 SENSITIVE Sensitive     CEFTRIAXONE <=0.25 SENSITIVE Sensitive     CIPROFLOXACIN <=0.25 SENSITIVE Sensitive     GENTAMICIN <=1 SENSITIVE Sensitive     IMIPENEM <=0.25  SENSITIVE Sensitive     NITROFURANTOIN <=16 SENSITIVE Sensitive     TRIMETH/SULFA <=20 SENSITIVE Sensitive     AMPICILLIN/SULBACTAM <=2 SENSITIVE Sensitive     PIP/TAZO <=4 SENSITIVE Sensitive     * >=100,000 COLONIES/mL ESCHERICHIA COLI     Time coordinating discharge: Over 30 minutes  SIGNED:   Sidney Ace, MD  [facility] 12/20/2022, 1:24 PM Pager   If 7PM-7AM, please contact night-coverage

## 2022-12-20 NOTE — Progress Notes (Signed)
Per Dr. Devota Pace, please give him a hospital follow up appt for the week of feb 13th when I'm on consults and order LFTs in 1week   Made hospital follow up and order labs

## 2022-12-24 ENCOUNTER — Ambulatory Visit: Payer: Medicare Other | Admitting: Cardiovascular Disease

## 2022-12-25 ENCOUNTER — Telehealth: Payer: Self-pay | Admitting: Cardiology

## 2022-12-25 ENCOUNTER — Other Ambulatory Visit: Payer: Self-pay | Admitting: Cardiology

## 2022-12-25 NOTE — Telephone Encounter (Signed)
Called patient and wife regarding missed 6 month LAAO follow up with Dr. Audie Box 12/24/22. This appointment was rescheduled for 01/14/23 @ 1pm. Medication changes made today. [facility] system updated to reflect stopping ASA '81mg'$  QD.    Kathyrn Drown NP-C Structural Heart Team

## 2022-12-26 ENCOUNTER — Inpatient Hospital Stay: Payer: Medicare Other

## 2022-12-26 VITALS — BP 140/68

## 2022-12-26 DIAGNOSIS — D469 Myelodysplastic syndrome, unspecified: Secondary | ICD-10-CM

## 2022-12-26 DIAGNOSIS — D461 Refractory anemia with ring sideroblasts: Secondary | ICD-10-CM | POA: Diagnosis not present

## 2022-12-26 LAB — HEMOGLOBIN AND HEMATOCRIT, BLOOD
HCT: 26 % — ABNORMAL LOW (ref 39.0–52.0)
Hemoglobin: 8.5 g/dL — ABNORMAL LOW (ref 13.0–17.0)

## 2022-12-26 MED ORDER — DARBEPOETIN ALFA 100 MCG/0.5ML IJ SOSY
100.0000 ug | PREFILLED_SYRINGE | Freq: Once | INTRAMUSCULAR | Status: AC
  Administered 2022-12-26: 100 ug via SUBCUTANEOUS
  Filled 2022-12-26: qty 0.5

## 2022-12-27 ENCOUNTER — Telehealth: Payer: Self-pay

## 2022-12-27 LAB — HEPATIC FUNCTION PANEL
ALT: 47 IU/L — ABNORMAL HIGH (ref 0–44)
AST: 48 IU/L — ABNORMAL HIGH (ref 0–40)
Albumin: 4.4 g/dL (ref 3.8–4.8)
Alkaline Phosphatase: 397 IU/L — ABNORMAL HIGH (ref 44–121)
Bilirubin Total: 3.6 mg/dL — ABNORMAL HIGH (ref 0.0–1.2)
Bilirubin, Direct: 1.83 mg/dL — ABNORMAL HIGH (ref 0.00–0.40)
Total Protein: 6.8 g/dL (ref 6.0–8.5)

## 2022-12-27 NOTE — Telephone Encounter (Signed)
Patient wife verbalized understanding of results. She states that she is going to get his PCP to recheck his urine to make sure the UTI is cleared up. She states he wanted to drink a beer last night with supper and she told him no. I told her no he does not need to do this till after his labs come back to normal

## 2022-12-27 NOTE — Telephone Encounter (Signed)
-----  Message from Lin Landsman, MD sent at 12/27/2022 11:59 AM EST ----- Caryl Pina  Please inform patient that his liver enzymes are improving and his bilirubin has significantly come down but still high.  I will see him for follow-up appointment as scheduled.  Rest of the liver disease workup so far came back normal  Rohini Vanga

## 2023-01-07 ENCOUNTER — Ambulatory Visit: Payer: Medicare Other | Attending: Internal Medicine | Admitting: Occupational Therapy

## 2023-01-07 DIAGNOSIS — R2681 Unsteadiness on feet: Secondary | ICD-10-CM | POA: Insufficient documentation

## 2023-01-07 DIAGNOSIS — I69254 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: Secondary | ICD-10-CM | POA: Insufficient documentation

## 2023-01-07 DIAGNOSIS — R262 Difficulty in walking, not elsewhere classified: Secondary | ICD-10-CM | POA: Diagnosis present

## 2023-01-07 DIAGNOSIS — R2689 Other abnormalities of gait and mobility: Secondary | ICD-10-CM | POA: Insufficient documentation

## 2023-01-07 DIAGNOSIS — R278 Other lack of coordination: Secondary | ICD-10-CM | POA: Insufficient documentation

## 2023-01-07 DIAGNOSIS — M6281 Muscle weakness (generalized): Secondary | ICD-10-CM | POA: Insufficient documentation

## 2023-01-07 NOTE — Therapy (Signed)
OUTPATIENT OCCUPATIONAL THERAPY NEURO EVALUATION  Patient Name: Vernon [name] MRN: [id number] DOB:[birth year], 77 y.o., male Today's Date: 01/07/2023  REFERRING PROVIDER:  Harrel Lemon, MD  END OF SESSION:  OT End of Session - 01/07/23 1121     Visit Number 1    Number of Visits 24    Date for OT Re-Evaluation 04/01/23    Authorization Time Period Progress reporting period starting 01/07/2023    OT Start Time 1000    OT Stop Time 1100    OT Time Calculation (min) 60 min    Activity Tolerance Patient tolerated treatment well    Behavior During Therapy WFL for tasks assessed/performed             Past Medical History:  Diagnosis Date   Anemia    Aortic stenosis    Arthritis    Complication of anesthesia    hard time getting bp up after knee replacement   Diabetes mellitus without complication (HCC)    GERD (gastroesophageal reflux disease)    occ tums prn   History of hiatal hernia    Hypertension    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[place])    Presence of Watchman left atrial appendage closure device 07/26/2022   41m Watchman FLX with Dr. 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;  Surgeon: EKristeen Miss MD;  [facility];  Service: Neurosurgery;  Laterality: N/A;   JOINT REPLACEMENT Right 2010   LEFT ATRIAL APPENDAGE OCCLUSION N/A 07/26/2022   Procedure: LEFT ATRIAL APPENDAGE OCCLUSION;  Surgeon: LVickie Epley MD;  [facility];  Service: Cardiovascular;  Laterality: N/A;   SHOULDER ARTHROSCOPY WITH ROTATOR CUFF REPAIR AND OPEN BICEPS TENODESIS Right 11/09/2019   Procedure: RIGHT SHOULDER ARTHROSCOPY WITH SUBSCAPULARIS REPAIR, SUBACROMIAL DECOMPRESSION,MINI OPEN ROTATOR CUFF REPAIR;  Surgeon: PLeim Fabry MD;  Location: [facility];  Service: Orthopedics;  Laterality: Right;   TEE WITHOUT  CARDIOVERSION N/A 07/26/2022   Procedure: TRANSESOPHAGEAL ECHOCARDIOGRAM (TEE);  Surgeon: LVickie Epley MD;  [facility];  Service: Cardiovascular;  Laterality: N/A;   Patient Active Problem List   Diagnosis Date Noted   Urinary tract infection without hematuria 12/19/2022   Painless jaundice 12/18/2022   Elevated LFTs 12/18/2022   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   Obstructive jaundice 12/17/2022   Atrial fibrillation ([place] 07/26/2022   Presence of Watchman left atrial appendage closure device 07/26/2022   Proteinuria, unspecified 05/23/2022   Simple renal cyst 05/23/2022   Long term current use of anticoagulant 04/04/2022   Rotator cuff syndrome 04/04/2022   Exposure to potentially hazardous substance 04/04/2022   Gout 04/04/2022   Osteoarthritis 04/04/2022   Osteoporosis 04/04/2022   Other specified diseases of hair and hair follicles 00000000  Pain in joint, lower leg 04/04/2022   Problem related to unspecified psychosocial circumstances 04/04/2022   General medical examination for administrative purposes 04/04/2022   Stage 3b chronic kidney disease ([facility]    Dysphagia, post-stroke    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 hemorrhage of brain ([facility] 02/26/2022   Cough [id number]  Acute metabolic encephalopathy [id number]  Obstructive hydrocephalus ([place] 02/19/2022   Dyslipidemia 02/19/2022   Dysphagia 02/19/2022   AKI (acute kidney injury) ([place]    Hyperkalemia    Anemia    Pleural effusion on right    Respiratory failure requiring intubation (HCC)    Cerebral edema ([facility]  Chronic atrial fibrillation (HCC)    Controlled type 2 diabetes mellitus with hyperglycemia, without long-term current use of insulin (HCC)    ICH (intracerebral hemorrhage) (HCC) 02/10/2022   Intracranial hemorrhage (HCC)    Anticoagulated    Moderate tricuspid regurgitation 07/26/2021   Moderate aortic valve stenosis 03/08/2020   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[place]) 02/18/2020   Macrocytic  anemia 01/13/2020   Spleen enlargement 01/13/2020   Bilateral carotid artery stenosis 07/14/2018   Atrial fibrillation, chronic ([place]) 07/14/2018   Type 2 diabetes with nephropathy ([place]) 02/14/2016   Essential hypertension 08/16/2014   Hyperlipemia, mixed 08/16/2014   Renal insufficiency 08/16/2014    ONSET DATE: 02/10/2022  REFERRING DIAG:  CVA  THERAPY DIAG:  Coordination Muscle weakness (generalized)  Other lack of coordination  Rationale for Evaluation and Treatment: Rehabilitation  SUBJECTIVE:   SUBJECTIVE STATEMENT:   Pt. reports changes in the LUE since his last OT visit  Pt accompanied by: significant other  PERTINENT HISTORY: Pt. is a 77 y. 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 Pt. received outpt. OT services following the CVA for Left sided weakness, and incoordination. Pt. was recently hospitalized from 1/22-1/25/24 for Obstructive Jaundice. PMHx includes: MDS, Chronic A-Fib, Essential HTN, Type II DM, Dyslipidemia, Presence of Watchman Left atrial appendage closure device, GERD  PRECAUTIONS: None  WEIGHT BEARING RESTRICTIONS: No  PAIN:  Are you having pain? No  FALLS: Has patient fallen in last 6 months? No  LIVING ENVIRONMENT: Lives with: lives with their spouse Lives in: House/apartment one storey, can fully live on the 1st floor. Stairs: Ramped entrance  Has following equipment at home: 4 wheeled walker, w/c-manual, grab bars, shower chair, HH shower head, toilet riser with arms.  PLOF: Independent  PATIENT GOALS: To improve arm and hand function   OBJECTIVE:   HAND DOMINANCE: Right  ADLs: Transfers/ambulation related to ADLs: Eating: Difficulty with motor control control Grooming:  Independent UB Dressing:  Diffiulty buttoning when no looking directly at the buttons.  LB Dressing: Independent donning pants, socks, and slide on shoes. Toileting: Independent Bathing:  Independent, wife assists with towel. Tub Shower transfers: Modified Independence  in the shower, CGA transfers out of shoer.    IADLs: Shopping:  Pt.'s wife performs, occasionally uses grocery store scooters Light housekeeping: Wife performs housekeeping tasks. Meal Prep: Prepares light snacks, and light meal prep Community mobility: Relies on family and friends Medication management: Independent Financial management: Does  with wife; No changes in how they are doing them Handwriting: 100% legible for name: Wife has noticed some changes with writing  Hobbies: Plays the guitar, workshop, music shop.  MOBILITY STATUS: Needs Assist: uses a rollator  POSTURE COMMENTS:  No Significant postural limitations Sitting balance: WFL  ACTIVITY TOLERANCE: Activity tolerance:  Limited  FUNCTIONAL OUTCOME MEASURES:  FOTO: 59; TR score: 60   UPPER EXTREMITY ROM:    Active ROM Right Eval  Left Eval [facility] [facility]  Shoulder flexion 106   Shoulder abduction 85   Shoulder adduction    Shoulder extension    Shoulder internal rotation    Shoulder external rotation    Elbow flexion WFL   Elbow extension WFL   Wrist flexion    Wrist extension WFL   Wrist ulnar deviation    Wrist radial deviation    Wrist pronation    Wrist supination WFL   (Blank rows = not tested)  Digit flexion to the [facility]:  2nd: 0cm, 3rd: 1.5cm,  4th: 2.5cm, 5th: 0cm   UPPER EXTREMITY MMT:     MMT Right eval Left eval  Shoulder flexion 3- 4  Shoulder abduction 3- 4  Shoulder adduction    Shoulder extension    Shoulder internal rotation    Shoulder external rotation    Middle trapezius    Lower trapezius    Elbow flexion 4 4  Elbow extension 4 4  Wrist flexion    Wrist extension 4 4  Wrist ulnar deviation    Wrist radial deviation    Wrist pronation    Wrist supination    (Blank rows = not tested)  HAND FUNCTION: Grip strength: N/A secondary to arthritis, and 3rd digit trigger finger    Lateral Pinch: R:  23#, L: 20#  3 Pt. Pinch: R: 22#, L: 21#  COORDINATION: 9 Hole Peg test: Right: 25 sec; Left: 2 min. & 34  sec  SENSATION: WFL Light touch: WFL Stereognosis: WFL  EDEMA:   N/A  MUSCLE TONE:  Intact  COGNITION: Overall cognitive status: Within functional limits for tasks assessed  VISION: Subjective report: Pt. reports double vision initially at the onset of the CVA with difficulty scanning to left when reading. Pt. Reports this has improved, and the double vision has resolved.   PERCEPTION: WFL  PRAXIS: Impaired motor control, and coordination   TODAY'S TREATMENT:                                                                                                                              DATE: 01/07/2023   PATIENT EDUCATION: Education details: OT POC, goals,  Person educated: Patient and Spouse Education method: Explanation, Demonstration, and Tactile cues Education comprehension: verbalized understanding, returned demonstration, verbal cues required, tactile cues required, and needs further education  Home Exercise Program:   Continue to assess and provide as indicated  GOALS: Goals reviewed with patient?  SHORT TERM GOALS: Target date: 02/18/2023    Pt. will demonstrate independence with HEP for LUE functioning Baseline: Eval: No current HEP Goal status: INITIAL  LONG TERM GOALS: Target date:  04/01/2023    Pt. will improve FOTO score by 1 point for patient perceived improvement with assessment specific ADLs, and IADLs.  Baseline: Eval: FOTO score: 59; TR score: 60 Goal status: INITIAL  2.  Pt. will independently modulate the accuracy of holding items without overgripping when reaching for items 50% of the time Baseline: Eval: Pt. is consistently overpowering, and over gripping items when grasping them with the left hand Goal status: INITIAL  3.  Pt. will improve left hand [facility] skills by 10 sec. to improve left hand grasp on objects.  Baseline: Eval: R: 25  sec. L: 2 min. & 34 sec.  Goal status: INITIAL  4.  Pt. Will utilize compensatory strategies for left hand function motor coordination 75% during ADLs, and IADL tasks. Baseline: Eval: Minimal use of compensatory strategies during ADLs, and IADLs. Goal status: INITIAL  5.  Pt. Will use compensatory strategies for left hand function 75% of the time when utilizing multiple body systems, dual tasking, and using the hand in a variety of different contexts. Baseline: Eval: Pt. Consistently has difficulty using his left hand to complete tasks when he is not looking directly at his hand, and has more difficulty using his left hand when standing, or attempting to walk with the walker.  Goal status: INITIAL  ASSESSMENT:    CLINICAL IMPRESSION:  Patient is a 77 y.o. male who was seen today for occupational therapy evaluation for CVA. Pt. has returned to OT services secondary to a decline in left hand function since he was previously seen at another facility. Pt. presents with decreased left hand motor control and [facility] skills. Pt. Presents with difficulty consistently engaging the left hand during tasks requiring multiple body systems to complete. Pt. Presents with difficulty modulating his grip, and grasp on items often leading to overpowering the grip/grasp causing the objects to be crushed or spin out of his hand. FOTO score is 59 with the TR score of 60. Pt. Will benefit form OT services to improve Left hand function, and [facility] skills in order to be able to use his hand when challenged in multiple contexts, and with dual tasking. Pt. will benefit from education about compensatory strategies for left hand function during ADLs, and IADLs.    PERFORMANCE DEFICITS: in functional skills including ADLs, IADLs, coordination, dexterity, ROM, strength, Fine motor control, and Gross motor control, cognitive skills including attention, memory, problem solving, and safety awareness, and psychosocial skills including  coping strategies, environmental adaptation, habits, and routines and behaviors.   IMPAIRMENTS: are limiting patient from ADLs, IADLs, play, leisure, and social participation.   CO-MORBIDITIES: may have co-morbidities  that affects occupational performance. Patient will benefit from skilled OT to address above impairments and improve overall function.  MODIFICATION OR ASSISTANCE TO COMPLETE EVALUATION: Min-Moderate modification of tasks or assist with assess necessary to complete an evaluation.  OT OCCUPATIONAL PROFILE AND HISTORY: Detailed assessment: Review of records and additional review of physical, cognitive, psychosocial history related to current functional performance.  CLINICAL DECISION MAKING: Moderate - several treatment options, min-mod task modification necessary  REHAB POTENTIAL: Good  EVALUATION COMPLEXITY: Moderate    PLAN:  OT FREQUENCY: 2x/week  OT DURATION: 12 weeks  PLANNED INTERVENTIONS: self care/ADL training, therapeutic exercise, therapeutic activity, patient/family education, cognitive remediation/compensation, and DME and/or AE instructions, neuromuscular re-education  RECOMMENDED OTHER SERVICES: OT services.  CONSULTED AND AGREED WITH PLAN OF CARE: Patient and family member/caregiver-wife  PLAN FOR NEXT SESSION: Initiate treatment  Harrel Carina, MS, OTR/L   Harrel Carina, OT 01/07/2023, 11:25 AM

## 2023-01-08 ENCOUNTER — Encounter: Payer: Self-pay | Admitting: Gastroenterology

## 2023-01-08 ENCOUNTER — Ambulatory Visit: Payer: Medicare Other | Admitting: Gastroenterology

## 2023-01-08 VITALS — BP 150/81 | HR 71 | Temp 98.0°F | Ht 69.0 in | Wt 196.0 lb

## 2023-01-08 DIAGNOSIS — K7589 Other specified inflammatory liver diseases: Secondary | ICD-10-CM

## 2023-01-08 DIAGNOSIS — R7989 Other specified abnormal findings of blood chemistry: Secondary | ICD-10-CM

## 2023-01-08 NOTE — Progress Notes (Signed)
Cephas Darby, MD [address]  [place]  [place], [place] [address]  Main: [phone]  Fax: [phone]    Gastroenterology Consultation  Referring Provider:     Baxter Hire, MD Primary Care Physician:  Baxter Hire, MD Primary Gastroenterologist:  Dr. Cephas Darby Reason for Consultation: Elevated LFTs        HPI:   Vernon [name] is a 77 y.o. male referred by Dr. Edwina Barth, Chrystie Nose, MD  for consultation & management of cholestatic hepatitis.  Patient has history of A-fib s/p watchman's procedure, well-controlled diabetes, metformin who was admitted to [facility] on 12/17/2022 secondary to cholestatic hepatitis, with bilirubin 11.7.  He was diagnosed with E. coli UTI.  The workup of cholestatic hepatitis was negative for biliary obstruction based on abdominal ultrasound, CT and MRCP.  No evidence of acute cholecystitis.  He underwent secondary liver disease workup which was negative.  No evidence of underlying cirrhosis of liver.  Elevated LFTs were thought to be secondary to severe sepsis from E. coli UTI.  Patient was treated for UTI and his urinalysis is clear of infection.  His most recent LFTs since discharge from the hospital from 12/26/2022 revealed ALT and phosphatase 397, AST 48, ALT 47, T. bili 3.6.  Patient is currently off antibiotics.  He denies any GI concerns.  Denies any pruritus.  He feels well overall with good energy levels.  Patient is accompanied by his wife today who also acknowledges the same.  NSAIDs: None  Antiplts/Anticoagulants/Anti thrombotics: None  GI Procedures: Unknown  Past Medical History:  Diagnosis Date   Anemia    Aortic stenosis    Arthritis    Complication of anesthesia    hard time getting bp up after knee replacement   Diabetes mellitus without complication (HCC)    GERD (gastroesophageal reflux disease)    occ tums prn   History of hiatal hernia    Hypertension    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[place])    Presence of  Watchman left atrial appendage closure device 07/26/2022   8m Watchman FLX with Dr. LQuentin Ore   Past Surgical History:  Procedure Laterality Date   CARPAL TUNNEL RELEASE  2012   CRANIOTOMY N/A 02/10/2022   Procedure: SUBOCCIPITAL CRANIECTOMY FOR EVACUATION OF CEREBELLAR HEMATOMA;  Surgeon: EKristeen Miss MD;  [facility]  Service: Neurosurgery;  Laterality: N/A;   JOINT REPLACEMENT Right 2010   LEFT ATRIAL APPENDAGE OCCLUSION N/A 07/26/2022   Procedure: LEFT ATRIAL APPENDAGE OCCLUSION;  Surgeon: LVickie Epley MD;  [facility];  Service: Cardiovascular;  Laterality: N/A;   SHOULDER ARTHROSCOPY WITH ROTATOR CUFF REPAIR AND OPEN BICEPS TENODESIS Right 11/09/2019   Procedure: RIGHT SHOULDER ARTHROSCOPY WITH SUBSCAPULARIS REPAIR, SUBACROMIAL DECOMPRESSION,MINI OPEN ROTATOR CUFF REPAIR;  Surgeon: PLeim Fabry MD;  Location: [facility];  Service: Orthopedics;  Laterality: Right;   TEE WITHOUT CARDIOVERSION N/A 07/26/2022   Procedure: TRANSESOPHAGEAL ECHOCARDIOGRAM (TEE);  Surgeon: LVickie Epley MD;  [facility];  Service: Cardiovascular;  Laterality: N/A;     Current Outpatient Medications:    metFORMIN (GLUCOPHAGE) 500 MG tablet, Take 500 mg by mouth 2 (two) times daily with a meal., Disp: , Rfl:    Multiple Vitamin (MULTI-VITAMIN) tablet, Take 1 tablet by mouth daily., Disp: , Rfl:    ondansetron (ZOFRAN-ODT) 4 MG disintegrating tablet, Take 4 mg by mouth every 8 (eight) hours as needed., Disp: , Rfl:    polyethylene glycol powder (GLYCOLAX/MIRALAX)  17 GM/SCOOP powder, Take 1 Container by mouth daily as needed., Disp: , Rfl:   Current Facility-Administered Medications:    0.9 %  sodium chloride infusion, , Intravenous, Continuous, Vickie Epley, MD   0.9 %  sodium chloride infusion, , Intravenous, Continuous, Vickie Epley, MD   Family History  Problem Relation Age of Onset   Cancer Maternal Uncle      Social History   Tobacco  Use   Smoking status: Former    Packs/day: 0.50    Years: 8.00    Total pack years: 4.00    Types: Cigarettes    Quit date: 07/21/1978    Years since quitting: 44.4   Smokeless tobacco: Never  Vaping Use   Vaping Use: Never used  Substance Use Topics   Alcohol use: Yes    Alcohol/week: 0.0 - 1.0 standard drinks of alcohol    Comment: rare beer   Drug use: Never    Allergies as of 01/08/2023   (No Known Allergies)    Review of Systems:    All systems reviewed and negative except where noted in HPI.   Physical Exam:  BP (!) 150/81 (BP Location: Left Arm, Patient Position: Sitting, Cuff Size: Normal)   Pulse 71   Temp 98 F (36.7 C) (Oral)   Ht 5' 9"$  (1.753 m)   Wt 196 lb (88.9 kg)   BMI 28.94 kg/m  No LMP for male patient.  General:   Alert,  Well-developed, well-nourished, pleasant and cooperative in NAD Head:  Normocephalic and atraumatic. Eyes:  Sclera clear, no icterus.   Conjunctiva pink. Ears:  Normal auditory acuity. Nose:  No deformity, discharge, or lesions. Mouth:  No deformity or lesions,oropharynx pink & moist. Neck:  Supple; no masses or thyromegaly. Lungs:  Respirations even and unlabored.  Clear throughout to auscultation.   No wheezes, crackles, or rhonchi. No acute distress. Heart:  Regular rate and rhythm; no murmurs, clicks, rubs, or gallops. Abdomen:  Normal bowel sounds. Soft, non-tender and non-distended without masses, hepatosplenomegaly or hernias noted.  No guarding or rebound tenderness.   Rectal: Not performed Msk:  Symmetrical without gross deformities. Good, equal movement & strength bilaterally. Pulses:  Normal pulses noted. Extremities:  No clubbing or edema.  No cyanosis. Neurologic:  Alert and oriented x3;  grossly normal neurologically. Skin:  Intact without significant lesions or rashes. No jaundice. Psych:  Alert and cooperative. Normal mood and affect.  Imaging Studies: Reviewed  Assessment and Plan:   Vernon [name] is a 77  y.o. male with  history of A-fib s/p watchman's procedure, diabetes well-controlled, maintained on metformin, recent hospitalization in end of January with cholestatic hepatitis and severe sepsis secondary to E. 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 coli with possible underlying chronic liver disease LFTs continue to improve No evidence of biliary obstruction based on ultrasound, CT and MRCP Patient did have intermittent mildly elevated alkaline phosphatase along with total bilirubin within last 1 to 2 years, ?  Seronegative PBC He has chronically elevated total bilirubin which was mild until this admission.  It is predominantly direct bilirubin which is consistent with cholestasis rather than hemolysis No evidence of portal hypertension Secondary liver disease workup has been negative, acute viral hepatitis panel is negative Discussed with patient that he may need liver biopsy in near future if his LFTs continue to remain elevated and he is agreeable Recheck LFTs, patient will get labs done at  cancer center tomorrow   Also, indeterminate lesions in the pancreas and spleen on noncontrast MRI may or may not be clinically relevant Recommend MRI abdomen with and without contrast in future when renal function is permissible  Follow up as needed   Cephas Darby, MD

## 2023-01-09 ENCOUNTER — Inpatient Hospital Stay: Payer: Medicare Other

## 2023-01-09 ENCOUNTER — Other Ambulatory Visit: Payer: Self-pay | Admitting: *Deleted

## 2023-01-09 ENCOUNTER — Ambulatory Visit: Payer: Medicare Other | Admitting: Occupational Therapy

## 2023-01-09 ENCOUNTER — Ambulatory Visit: Payer: Medicare Other

## 2023-01-09 ENCOUNTER — Other Ambulatory Visit: Payer: Self-pay

## 2023-01-09 ENCOUNTER — Inpatient Hospital Stay: Payer: Medicare Other | Attending: Internal Medicine

## 2023-01-09 VITALS — BP 139/60

## 2023-01-09 DIAGNOSIS — R2681 Unsteadiness on feet: Secondary | ICD-10-CM

## 2023-01-09 DIAGNOSIS — M6281 Muscle weakness (generalized): Secondary | ICD-10-CM

## 2023-01-09 DIAGNOSIS — R278 Other lack of coordination: Secondary | ICD-10-CM

## 2023-01-09 DIAGNOSIS — N183 Chronic kidney disease, stage 3 unspecified: Secondary | ICD-10-CM | POA: Insufficient documentation

## 2023-01-09 DIAGNOSIS — D469 Myelodysplastic syndrome, unspecified: Secondary | ICD-10-CM

## 2023-01-09 DIAGNOSIS — D4621 Refractory anemia with excess of blasts 1: Secondary | ICD-10-CM | POA: Diagnosis present

## 2023-01-09 DIAGNOSIS — R7989 Other specified abnormal findings of blood chemistry: Secondary | ICD-10-CM

## 2023-01-09 DIAGNOSIS — R262 Difficulty in walking, not elsewhere classified: Secondary | ICD-10-CM

## 2023-01-09 LAB — HEPATIC FUNCTION PANEL
ALT: 21 U/L (ref 0–44)
AST: 29 U/L (ref 15–41)
Albumin: 4.1 g/dL (ref 3.5–5.0)
Alkaline Phosphatase: 169 U/L — ABNORMAL HIGH (ref 38–126)
Bilirubin, Direct: 0.7 mg/dL — ABNORMAL HIGH (ref 0.0–0.2)
Indirect Bilirubin: 1.6 mg/dL — ABNORMAL HIGH (ref 0.3–0.9)
Total Bilirubin: 2.3 mg/dL — ABNORMAL HIGH (ref 0.3–1.2)
Total Protein: 7.6 g/dL (ref 6.5–8.1)

## 2023-01-09 LAB — BASIC METABOLIC PANEL
Anion gap: 9 (ref 5–15)
BUN: 25 mg/dL — ABNORMAL HIGH (ref 8–23)
CO2: 25 mmol/L (ref 22–32)
Calcium: 9.2 mg/dL (ref 8.9–10.3)
Chloride: 106 mmol/L (ref 98–111)
Creatinine, Ser: 1.25 mg/dL — ABNORMAL HIGH (ref 0.61–1.24)
GFR, Estimated: 60 mL/min — ABNORMAL LOW (ref >60)
Glucose, Bld: 150 mg/dL — ABNORMAL HIGH (ref 70–99)
Potassium: 4.5 mmol/L (ref 3.5–5.1)
Sodium: 140 mmol/L (ref 135–145)

## 2023-01-09 LAB — IRON AND TIBC
Iron: 170 ug/dL (ref 45–182)
Saturation Ratios: 56 % — ABNORMAL HIGH (ref 17.9–39.5)
TIBC: 304 ug/dL (ref 250–450)
UIBC: 134 ug/dL

## 2023-01-09 LAB — CBC WITH DIFFERENTIAL/PLATELET
Abs Immature Granulocytes: 0.02 10*3/uL (ref 0.00–0.07)
Basophils Absolute: 0 10*3/uL (ref 0.0–0.1)
Basophils Relative: 1 %
Eosinophils Absolute: 0.2 10*3/uL (ref 0.0–0.5)
Eosinophils Relative: 4 %
HCT: 27.3 % — ABNORMAL LOW (ref 39.0–52.0)
Hemoglobin: 8.7 g/dL — ABNORMAL LOW (ref 13.0–17.0)
Immature Granulocytes: 0 %
Lymphocytes Relative: 30 %
Lymphs Abs: 1.3 10*3/uL (ref 0.7–4.0)
MCH: 30.2 pg (ref 26.0–34.0)
MCHC: 31.9 g/dL (ref 30.0–36.0)
MCV: 94.8 fL (ref 80.0–100.0)
Monocytes Absolute: 0.4 10*3/uL (ref 0.1–1.0)
Monocytes Relative: 9 %
Neutro Abs: 2.6 10*3/uL (ref 1.7–7.7)
Neutrophils Relative %: 56 %
Platelets: 250 10*3/uL (ref 150–400)
RBC: 2.88 MIL/uL — ABNORMAL LOW (ref 4.22–5.81)
RDW: 25.8 % — ABNORMAL HIGH (ref 11.5–15.5)
WBC: 4.5 10*3/uL (ref 4.0–10.5)
nRBC: 0 % (ref 0.0–0.2)

## 2023-01-09 LAB — FERRITIN: Ferritin: 831 ng/mL — ABNORMAL HIGH (ref 24–336)

## 2023-01-09 LAB — VITAMIN B12: Vitamin B-12: 1449 pg/mL — ABNORMAL HIGH (ref 180–914)

## 2023-01-09 MED ORDER — DARBEPOETIN ALFA 100 MCG/0.5ML IJ SOSY
100.0000 ug | PREFILLED_SYRINGE | Freq: Once | INTRAMUSCULAR | Status: AC
  Administered 2023-01-09: 100 ug via SUBCUTANEOUS
  Filled 2023-01-09: qty 0.5

## 2023-01-09 NOTE — Therapy (Signed)
OUTPATIENT OCCUPATIONAL THERAPY NEURO TREATMENT  Patient Name: Vernon [name] MRN: [id number] DOB:1946-09-02, 77 y.o., male Today's Date: 01/09/2023  REFERRING PROVIDER:  Harrel Lemon, MD  END OF SESSION:  OT End of Session - 01/09/23 1534     Visit Number 2    Number of Visits 24    Date for OT Re-Evaluation 04/01/23    Authorization Time Period Progress reporting period starting 01/07/2023    OT Start Time 1307    OT Stop Time 1345    OT Time Calculation (min) 38 min    Activity Tolerance Patient tolerated treatment well    Behavior During Therapy WFL for tasks assessed/performed             Past Medical History:  Diagnosis Date   Anemia    Aortic stenosis    Arthritis    Complication of anesthesia    hard time getting bp up after knee replacement   Diabetes mellitus without complication (HCC)    GERD (gastroesophageal reflux disease)    occ tums prn   History of hiatal hernia    Hypertension    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[place])    Presence of Watchman left atrial appendage closure device 07/26/2022   7m Watchman FLX with Dr. 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;  Surgeon: EKristeen Miss MD;  [facility]  Service: Neurosurgery;  Laterality: N/A;   JOINT REPLACEMENT Right 2010   LEFT ATRIAL APPENDAGE OCCLUSION N/A 07/26/2022   Procedure: LEFT ATRIAL APPENDAGE OCCLUSION;  Surgeon: LVickie Epley MD;  [facility];  Service: Cardiovascular;  Laterality: N/A;   SHOULDER ARTHROSCOPY WITH ROTATOR CUFF REPAIR AND OPEN BICEPS TENODESIS Right 11/09/2019   Procedure: RIGHT SHOULDER ARTHROSCOPY WITH SUBSCAPULARIS REPAIR, SUBACROMIAL DECOMPRESSION,MINI OPEN ROTATOR CUFF REPAIR;  Surgeon: PLeim Fabry MD;  Location: [facility];  Service: Orthopedics;  Laterality: Right;   TEE WITHOUT  CARDIOVERSION N/A 07/26/2022   Procedure: TRANSESOPHAGEAL ECHOCARDIOGRAM (TEE);  Surgeon: LVickie Epley MD;  [facility];  Service: Cardiovascular;  Laterality: N/A;   Patient Active Problem List   Diagnosis Date Noted   Urinary tract infection without hematuria 12/19/2022   Painless jaundice 12/18/2022   Elevated LFTs 12/18/2022   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   Obstructive jaundice 12/17/2022   Atrial fibrillation ([place] 07/26/2022   Presence of Watchman left atrial appendage closure device 07/26/2022   Proteinuria, unspecified 05/23/2022   Simple renal cyst 05/23/2022   Long term current use of anticoagulant 04/04/2022   Rotator cuff syndrome 04/04/2022   Exposure to potentially hazardous substance 04/04/2022   Gout 04/04/2022   Osteoarthritis 04/04/2022   Osteoporosis 04/04/2022   Other specified diseases of hair and hair follicles 00000000  Pain in joint, lower leg 04/04/2022   Problem related to unspecified psychosocial circumstances 04/04/2022   General medical examination for administrative purposes 04/04/2022   Stage 3b chronic kidney disease ([place]    Dysphagia, post-stroke    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 hemorrhage of brain ([facility] 02/26/2022   Cough [id number]  Acute metabolic encephalopathy [id number]  Obstructive hydrocephalus ([place] 02/19/2022   Dyslipidemia 02/19/2022   Dysphagia 02/19/2022   AKI (acute kidney injury) ([place]    Anemia    Pleural effusion on right    Respiratory failure requiring intubation (HCC)    Cerebral edema (HCC)    Chronic  atrial fibrillation (HCC)    Controlled type 2 diabetes mellitus with hyperglycemia, without long-term current use of insulin (HCC)    ICH (intracerebral hemorrhage) ([place]) 02/10/2022   Intracranial hemorrhage (HCC)    Anticoagulated    Moderate tricuspid regurgitation 07/26/2021   Moderate aortic valve stenosis 03/08/2020   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[place]) 02/18/2020   Macrocytic anemia 01/13/2020    Spleen enlargement 01/13/2020   Bilateral carotid artery stenosis 07/14/2018   Atrial fibrillation, chronic ([place]) 07/14/2018   Type 2 diabetes with nephropathy ([place]) 02/14/2016   Essential hypertension 08/16/2014   Hyperlipemia, mixed 08/16/2014   Renal insufficiency 08/16/2014    ONSET DATE: 02/10/2022  REFERRING DIAG:  CVA  THERAPY DIAG:  Coordination Muscle weakness (generalized)  Other lack of coordination  Rationale for Evaluation and Treatment: Rehabilitation  SUBJECTIVE:   SUBJECTIVE STATEMENT:   Pt. reports having a PT evaluation after the OT session today.   Pt accompanied by: significant other  PERTINENT HISTORY: Pt. is a 77 y. 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 Pt. received outpt. OT services following the CVA for Left sided weakness, and incoordination. Pt. was recently hospitalized from 1/22-1/25/24 for Obstructive Jaundice. PMHx includes: MDS, Chronic A-Fib, Essential HTN, Type II DM, Dyslipidemia, Presence of Watchman Left atrial appendage closure device, GERD  PRECAUTIONS: None  WEIGHT BEARING RESTRICTIONS: No  PAIN:  Are you having pain? No  FALLS: Has patient fallen in last 6 months? No  LIVING ENVIRONMENT: Lives with: lives with their spouse Lives in: House/apartment one storey, can fully live on the 1st floor. Stairs: Ramped entrance  Has following equipment at home: 4 wheeled walker, w/c-manual, grab bars, shower chair, HH shower head, toilet riser with arms.  PLOF: Independent  PATIENT GOALS: To improve arm and hand function   OBJECTIVE:   HAND DOMINANCE: Right  ADLs: Transfers/ambulation related to ADLs: Eating: Difficulty with motor control control Grooming:  Independent UB Dressing:  Diffiulty buttoning when no looking directly at the buttons.  LB Dressing: Independent donning pants, socks, and slide on shoes. Toileting: Independent Bathing:  Independent, wife assists with towel. Tub Shower transfers: Modified Independence  in the shower, CGA transfers out of shoer.    IADLs: Shopping:  Pt.'s wife performs, occasionally uses grocery store scooters Light housekeeping: Wife performs housekeeping tasks. Meal Prep: Prepares light snacks, and light meal prep Community mobility: Relies on family and friends Medication management: Independent Financial management: Does  with wife; No changes in how they are doing them Handwriting: 100% legible for name: Wife has noticed some changes with writing  Hobbies: Plays the guitar, workshop, music shop.  MOBILITY STATUS: Needs Assist: uses a rollator  POSTURE COMMENTS:  No Significant postural limitations Sitting balance: WFL  ACTIVITY TOLERANCE: Activity tolerance:  Limited  FUNCTIONAL OUTCOME MEASURES:  FOTO: 59; TR score: 60   UPPER EXTREMITY ROM:    Active ROM Right Eval  Left Eval [facility]  Shoulder flexion 106   Shoulder abduction 85   Shoulder adduction    Shoulder extension    Shoulder internal rotation    Shoulder external rotation    Elbow flexion WFL   Elbow extension WFL   Wrist flexion    Wrist extension WFL   Wrist ulnar deviation    Wrist radial deviation    Wrist pronation    Wrist supination WFL   (Blank rows = not tested)  Digit flexion to the [facility]:  2nd: 0cm, 3rd: 1.5cm,  4th: 2.5cm, 5th: 0cm   UPPER EXTREMITY MMT:     MMT Right eval Left eval  Shoulder flexion 3- 4  Shoulder abduction 3- 4  Shoulder adduction    Shoulder extension    Shoulder internal rotation    Shoulder external rotation    Middle trapezius    Lower trapezius    Elbow flexion 4 4  Elbow extension 4 4  Wrist flexion    Wrist extension 4 4  Wrist ulnar deviation    Wrist radial deviation    Wrist pronation    Wrist supination    (Blank rows = not tested)  HAND FUNCTION: Grip strength: N/A secondary to arthritis, and 3rd digit trigger finger    Lateral Pinch: R:  23#, L: 20#  3 Pt. Pinch: R: 22#, L: 21#  COORDINATION: 9 Hole Peg test: Right: 25 sec; Left: 2 min. & 34  sec  SENSATION: WFL Light touch: WFL Stereognosis: WFL  EDEMA:   N/A  MUSCLE TONE:  Intact  COGNITION: Overall cognitive status: Within functional limits for tasks assessed  VISION: Subjective report: Pt. reports double vision initially at the onset of the CVA with difficulty scanning to left when reading. Pt. Reports this has improved, and the double vision has resolved.   PERCEPTION: WFL  PRAXIS: Impaired motor control, and coordination   TODAY'S TREATMENT:                                                                                                                              DATE: 01/09/2023   Neuromuscular re-education:  Pt. Worked on goal directed reaching for targets to place flat shapes over 4 vertical dowels of progressively increasing heights. Pt. Pt. Placing them, and removing them. Pt. Worked LUE motor control with alternating reaching for targets. Pt. worked on alternating weightbearing, and proprioception through the LUE, and hand during each task. Pt. worked on [employer] style discs with emphasis placed on stabilizing the disc between his 3rd digit while turning it with his 2nd digit. P. Worked on modulating graded pressure on objects with his left hand to avoid over gripping, and over grasping. Pt. Worked on grasping cups designed to focus on facilitating graded pressure.  PATIENT EDUCATION: Education details:  Proximal stabilization  Person educated: Patient and Spouse Education method: Explanation, Demonstration, and Tactile cues Education comprehension: verbalized understanding, returned demonstration, verbal cues required, tactile cues required, and needs further education  Home Exercise Program:   Continue to assess and provide as indicated  GOALS: Goals reviewed with patient?  SHORT TERM GOALS: Target date: 02/18/2023    Pt. will  demonstrate independence with HEP for LUE functioning Baseline: Eval: No current HEP Goal status: INITIAL  LONG TERM GOALS: Target date:  04/01/2023    Pt. will improve FOTO score by 1 point for patient perceived improvement with assessment specific ADLs, and IADLs.  Baseline: Eval: FOTO score: 59; TR score: 60 Goal status: INITIAL  2.  Pt. will independently modulate the accuracy of holding items without overgripping when reaching for items 50% of the time Baseline: Eval: Pt. is consistently overpowering, and over gripping items when grasping them with the left hand Goal status: INITIAL  3.  Pt. will improve left hand [facility] skills by 10 sec. to improve left hand grasp on objects.  Baseline: Eval: R: 25 sec. L: 2 min. & 34 sec.  Goal status: INITIAL  4.  Pt. Will utilize compensatory strategies for left hand function motor coordination 75% during ADLs, and IADL tasks. Baseline: Eval: Minimal use of compensatory strategies during ADLs, and IADLs. Goal status: INITIAL  5.  Pt. Will use compensatory strategies for left hand function 75% of the time when utilizing multiple body systems, dual tasking, and using the hand in a variety of different contexts. Baseline: Eval: Pt. Consistently has difficulty using his left hand to complete tasks when he is not looking directly at his hand, and has more difficulty using his left hand when standing, or attempting to walk with the walker.  Goal status: INITIAL  ASSESSMENT:    CLINICAL IMPRESSION:  Pt. required cues, and assist proximally at the elbow when reaching at various heights. More support was required as Pt. Fatigues as motor control,a nd accuracy decreases. Pt. Education was provided about proximal stabilization for more distal control. Pt. was able to intermittently grasp the [place] discs, stabilize them with his thumb and 3rd digit with enough pressure to turn them with the 2nd digit. At times, as Pt. increases the pressure on the the disc  it  would then spin out of his grasp. Pt. Continues to require work on controlling, and modulating graded pressure on the objects within his left hand. Pt. Continues to benefit form OT services to improve Left hand function, and [facility] skills in order to be able to use his hand when challenged in multiple contexts, and with dual tasking. Pt. will benefit from education about compensatory strategies for left hand function during ADLs, and IADLs.    PERFORMANCE DEFICITS: in functional skills including ADLs, IADLs, coordination, dexterity, ROM, strength, Fine motor control, and Gross motor control, cognitive skills including attention, memory, problem solving, and safety awareness, and psychosocial skills including coping strategies, environmental adaptation, habits, and routines and behaviors.   IMPAIRMENTS: are limiting patient from ADLs, IADLs, play, leisure, and social participation.   CO-MORBIDITIES: may have co-morbidities  that affects occupational performance. Patient will benefit from skilled OT to address above impairments and improve overall function.  MODIFICATION OR ASSISTANCE TO COMPLETE EVALUATION: Min-Moderate modification of tasks or assist with assess necessary to complete an evaluation.  OT OCCUPATIONAL PROFILE AND HISTORY: Detailed assessment: Review of records and additional review of physical, cognitive, psychosocial history related to current functional performance.  CLINICAL DECISION MAKING: Moderate - several treatment options, min-mod task modification necessary  REHAB POTENTIAL: Good  EVALUATION COMPLEXITY: Moderate    PLAN:  OT FREQUENCY: 2x/week  OT DURATION: 12 weeks  PLANNED INTERVENTIONS: self care/ADL training, therapeutic exercise, therapeutic activity, patient/family education, cognitive remediation/compensation, and DME and/or AE instructions, neuromuscular re-education  RECOMMENDED OTHER SERVICES: OT services.  CONSULTED AND AGREED WITH PLAN OF CARE:  Patient and family member/caregiver-wife  PLAN FOR NEXT SESSION: Initiate treatment  Harrel Carina, MS, OTR/L   Harrel Carina, OT 01/09/2023, 3:39 PM

## 2023-01-09 NOTE — Progress Notes (Signed)
Pt brings order from [place], Dr Marius Ditch

## 2023-01-09 NOTE — Therapy (Signed)
OUTPATIENT PHYSICAL THERAPY NEURO EVALUATION   Patient Name: Vernon [name] MRN: [id number] DOB:Jul 08, 1946, 77 y.o., male Today's Date: 01/09/2023   PCP:   Baxter Hire, MD   REFERRING PROVIDER:   Baxter Hire, MD    END OF SESSION:  PT End of Session - 01/09/23 1602     Visit Number 1    Number of Visits 25    Date for PT Re-Evaluation 04/03/23    PT Start Time 1350    PT Stop Time 1430    PT Time Calculation (min) 40 min    Equipment Utilized During Treatment Gait belt    Activity Tolerance Patient tolerated treatment well    Behavior During Therapy WFL for tasks assessed/performed             Past Medical History:  Diagnosis Date   Anemia    Aortic stenosis    Arthritis    Complication of anesthesia    hard time getting bp up after knee replacement   Diabetes mellitus without complication (HCC)    GERD (gastroesophageal reflux disease)    occ tums prn   History of hiatal hernia    Hypertension    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[place])    Presence of Watchman left atrial appendage closure device 07/26/2022   48m Watchman FLX with Dr. 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;  Surgeon: EKristeen Miss MD;  [facility]  Service: Neurosurgery;  Laterality: N/A;   JOINT REPLACEMENT Right 2010   LEFT ATRIAL APPENDAGE OCCLUSION N/A 07/26/2022   Procedure: LEFT ATRIAL APPENDAGE OCCLUSION;  Surgeon: LVickie Epley MD;  [facility];  Service: Cardiovascular;  Laterality: N/A;   SHOULDER ARTHROSCOPY WITH ROTATOR CUFF REPAIR AND OPEN BICEPS TENODESIS Right 11/09/2019   Procedure: RIGHT SHOULDER ARTHROSCOPY WITH SUBSCAPULARIS REPAIR, SUBACROMIAL DECOMPRESSION,MINI OPEN ROTATOR CUFF REPAIR;  Surgeon: PLeim Fabry MD;  Location: [facility];  Service: Orthopedics;  Laterality: Right;   TEE  WITHOUT CARDIOVERSION N/A 07/26/2022   Procedure: TRANSESOPHAGEAL ECHOCARDIOGRAM (TEE);  Surgeon: LVickie Epley MD;  [facility];  Service: Cardiovascular;  Laterality: N/A;   Patient Active Problem List   Diagnosis Date Noted   Urinary tract infection without hematuria 12/19/2022   Painless jaundice 12/18/2022   Elevated LFTs 12/18/2022   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   Obstructive jaundice 12/17/2022   Atrial fibrillation ([facility] 07/26/2022   Presence of Watchman left atrial appendage closure device 07/26/2022   Proteinuria, unspecified 05/23/2022   Simple renal cyst 05/23/2022   Long term current use of anticoagulant 04/04/2022   Rotator cuff syndrome 04/04/2022   Exposure to potentially hazardous substance 04/04/2022   Gout 04/04/2022   Osteoarthritis 04/04/2022   Osteoporosis 04/04/2022   Other specified diseases of hair and hair follicles 00000000  Pain in joint, lower leg 04/04/2022   Problem related to unspecified psychosocial circumstances 04/04/2022   General medical examination for administrative purposes 04/04/2022   Stage 3b chronic kidney disease ([facility]    Dysphagia, post-stroke    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 hemorrhage of brain ([facility] 02/26/2022   Cough [id number]  Acute metabolic encephalopathy [id number]  Obstructive hydrocephalus ([facility] 02/19/2022   Dyslipidemia 02/19/2022   Dysphagia 02/19/2022   AKI (acute kidney injury) ([facility]    Anemia    Pleural effusion on right    Respiratory failure  requiring intubation (HCC)    Cerebral edema (HCC)    Chronic atrial fibrillation (HCC)    Controlled type 2 diabetes mellitus with hyperglycemia, without long-term current use of insulin (HCC)    ICH (intracerebral hemorrhage) ([place]) 02/10/2022   Intracranial hemorrhage (HCC)    Anticoagulated    Moderate tricuspid regurgitation 07/26/2021   Moderate aortic valve stenosis 03/08/2020   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[place]) 02/18/2020   Macrocytic anemia  01/13/2020   Spleen enlargement 01/13/2020   Bilateral carotid artery stenosis 07/14/2018   Atrial fibrillation, chronic ([place]) 07/14/2018   Type 2 diabetes with nephropathy ([place]) 02/14/2016   Essential hypertension 08/16/2014   Hyperlipemia, mixed 08/16/2014   Renal insufficiency 08/16/2014    ONSET DATE: 02/10/2022  REFERRING DIAG:  I63.9 (ICD-10-CM) - Cerebral infarction, unspecified  I63.9 (ICD-10-CM) - CVA (cerebral vascular accident) ([place])    THERAPY DIAG:  Unsteadiness on feet  Muscle weakness (generalized)  Difficulty in walking, not elsewhere classified  Other lack of coordination  Rationale for Evaluation and Treatment: Rehabilitation  SUBJECTIVE:                                                                                                                                                                                             SUBJECTIVE STATEMENT: Pt presents to PT eval in transport chair. Pt had CVA in March 2023. He reports his biggest problem now is his arm, hand, endurance and balance. Pt used to lift weights and was active prior to stroke. While he reports imbalance, the pt has not fallen recently, reports 1 fall in past year. He says "I fall before my brain realizes I'm falling." Pt uses a [id number]. However they had to modify 4WW due to poor control over it, which pt says is largely due to LUE. Pt reports no pain currently, but he has hx of R shoulder pain (hx of bicep tear), has bilat knee pain (hx of R knee replacement). He wants to improve his coordination and endurance.   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 Pt hospitalized 1/22-1/25/24 for obstructive Jaundice. Pt reports it was found he had a UTI with recent hospital stay. He wants to improve his endurance and balance. PMH significant for MDS, chronic  a-fib, HTN, DMII, Presence of Watchman Left atrial appendage closure device, anemia, arthritis, hx of hiatal hernia, hx craniotomy 02/10/22, L atrial appendage occlusion 07/26/22, TEE without cardioversion 07/26/2022    PAIN:  Are you having pain? No  PRECAUTIONS:  Fall  WEIGHT BEARING RESTRICTIONS: No  FALLS: Has patient fallen in last 6 months? No  LIVING ENVIRONMENT: Lives with: lives with their family and lives with their spouse Lives in:  Stairs:  Has following equipment at home: Environmental consultant - 4 wheeled  PLOF: Independent  PATIENT GOALS: improve coordination, endurance and balance  OBJECTIVE:   DIAGNOSTIC FINDINGS:   02/20/22 CT HEAD: "IMPRESSION: No evidence of interval acute intracranial abnormality.   Continued evolution of postoperative changes from prior left suboccipital craniectomy and left posterior fossa hematoma evacuation. Residual hemorrhage within the left cerebellar hemisphere, similar to slightly decreased from the prior head CT of 02/18/2022. Unchanged left cerebellar edema. Stable mass effect with partial effacement of the fourth ventricle. No evidence of hydrocephalus.   Persistent edema and trace persistent hemorrhage within the right occipital lobe along the tract from prior attempted ventricular catheter placement.   Stable chronic small vessel ischemic changes within the cerebral white matter.   Paranasal sinus disease, as described."  MR BRAIN 02/12/22: " IMPRESSION: 1. Left suboccipital craniectomy for hematoma evacuation. Roughly 13 mL residual left cerebellar blood tracking across midline to the right. Cerebellar edema with partial effacement of the 4th ventricle. But basilar cistern patency is adequate, with no ventriculomegaly.   2. Sequelae of attempted EVD placement across the splenium of the corpus callosum. Trace intraventricular and subarachnoid hemorrhage.   3. Background mild to moderate nonspecific cerebral white matter signal  changes.   4. 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."  Please refer to chart for full details    COGNITION: Overall cognitive status: Within functional limits for tasks assessed   SENSATION: WFL BLE   COORDINATION: Impaired LUE WFL BLE  EDEMA:  None observed at present    POSTURE: rounded shoulders, forward head, increased thoracic kyphosis, and flexed trunk   LOWER EXTREMITY MMT:    Grossly 4+/5 BLE, greatest deficits in hip musculature    TRANSFERS: Assistive device utilized:  performs STS hands free to RW upon standing   Sit to stand: Modified independence - grabs on to RW in standing Stand to sit: Complete Independence  RAMP:  Level of Assistance: Modified independence per report  GAIT: Gait pattern: use of RW, crouched posture with increased BUE weightbearing on RW, decreased bilat hip ext Distance walked: 10 meters  Level of assistance: CGA   FUNCTIONAL TESTS:    6MWT - deferred to next 1-2 visits  5xSTS: 14.89 sec hands-free  BERG: initiated on this date (see flow chart), will need to complete next 1-2 visits. Unable to complete due to time limitation, pt did finish the following which were scored STS: 4 Standing unsupported: 3 Sitting with back unsupported: 4 Stand to Sit: 4 Transfers: 3 Standing with EC unsupported: 3 Standing reach forward: 3 Standing turn to look behind: 4 Turn 360 deg: 3 Standing one foot in front: 0   10MWT: 0.82 m/s some L knee pain. Pt reports he can feel his feet trying to cross over. Pt with crouched posture with walking, decreased bilat hip ext, increased UE weightbearing   PATIENT SURVEYS:  OT CVA FOTO only - not completed for PT TODAY'S TREATMENT:  DATE:   Eval only    PATIENT EDUCATION: Education details: exam findings, plan,  testing Person educated: Patient and Spouse Education method: Explanation Education comprehension: verbalized understanding  HOME EXERCISE PROGRAM: To be initiated next 1-2 visits   GOALS: Goals reviewed with patient? Yes  SHORT TERM GOALS: Target date: 02/20/2023    Patient will be independent in home exercise program to improve strength/mobility for better functional independence with ADLs. Baseline: to be intiated Goal status: INITIAL   LONG TERM GOALS: Target date: 04/03/2023     Patient will complete five times sit to stand test in < 10 seconds indicating an increased LE strength and improved balance. Baseline: 14.89 sec Goal status: INITIAL  2.  Patient will increase 10 meter walk test to >1.6ms as to improve gait speed for better community ambulation and to reduce fall risk. Baseline:  0.82 m/s Goal status: INITIAL  3.  Patient will increase Berg Balance score by > 6 points to demonstrate decreased fall risk during functional activities. Baseline: Initiated testing (see flow chart, note), will need to complete next visit. Unable today due to time. Goal status: INITIAL  4.  Patient will increase six minute walk test distance to >1000 for progression to community ambulator and improve gait ability Baseline: to be assessed next 1-2 visits Goal status: INITIAL    ASSESSMENT:  CLINICAL IMPRESSION: Patient is a pleasant 77y.o. male who was seen today for physical therapy evaluation and treatment s/p CVA March 2023. Exam findings indicate impairments in LE strength, pain, coordination, gait,  endurance, balance, and mobility. While pt current score on 5xSTS is just below 15 seconds (14.89 sec), pt observed unsteady with gait during 10MWT with various balance tasks on the BERG, with BERG to be completed next session. These findings indicate pt is at an increased risk for future falls. The pt will benefit from further skilled PT to address these impairments in order to  decrease fall risk and return pt to PLOF.  OBJECTIVE IMPAIRMENTS: Abnormal gait, decreased activity tolerance, decreased balance, decreased coordination, decreased endurance, decreased knowledge of use of DME, decreased mobility, difficulty walking, decreased strength, impaired UE functional use, improper body mechanics, postural dysfunction, and pain.   ACTIVITY LIMITATIONS: carrying, lifting, bending, standing, squatting, stairs, transfers, and locomotion level  PARTICIPATION LIMITATIONS: meal prep, cleaning, laundry, driving, shopping, community activity, and yard work  PERSONAL FACTORS: Age, Past/current experiences, Time since onset of injury/illness/exacerbation, and 3+ comorbidities: PMH significant for MDS, chronic a-fib, HTN, DMII, Presence of Watchman Left atrial appendage closure device, anemia, arthritis, hx of hiatal hernia, hx craniotomy 02/10/22, L atrial appendage occlusion 07/26/22, TEE without cardioversion 07/26/2022  are also affecting patient's functional outcome.   REHAB POTENTIAL: Good  CLINICAL DECISION MAKING: Evolving/moderate complexity  EVALUATION COMPLEXITY: Moderate  PLAN:  PT FREQUENCY: 2x/week  PT DURATION: 12 weeks  PLANNED INTERVENTIONS: Therapeutic exercises, Therapeutic activity, Neuromuscular re-education, Balance training, Gait training, Patient/Family education, Self Care, Joint mobilization, Stair training, Vestibular training, Canalith repositioning, Visual/preceptual remediation/compensation, Orthotic/Fit training, DME instructions, Electrical stimulation, Wheelchair mobility training, Spinal mobilization, Cryotherapy, Moist heat, Splintting, Taping, Ultrasound, Manual therapy, and Re-evaluation  PLAN FOR NEXT SESSION: finish BERG, 6MWT, endurance, balance   HZollie Pee PT 01/09/2023, 4:28 PM

## 2023-01-13 NOTE — Progress Notes (Unsigned)
Cardiology Office Note:   Date:  01/14/2023  NAME:  Vernon [name]    MRN: [id number] DOB:  09/02/1946   PCP:  Baxter Hire, MD  Cardiologist:  None  Electrophysiologist:  Vickie Epley, MD   Referring MD: Baxter Hire, MD   Chief Complaint  Patient presents with   Shortness of Breath   History of Present Illness:   Vernon [name] is a 77 y.o. male with a hx of CVA, AS, CAD, DM, HTN, HLD who is being seen today for the evaluation of CAD at the request of Baxter Hire, MD. History of Afib s/p Watchman. Had IPH with CVA. Coronary calcium score severely elevated. Moderate AS. He presents with his wife.  Underwent watchman placement in August 2023.  Coronary calcium score severely elevated.  Has not had an ischemia evaluation.  He also has moderate stenosis.  Murmur consistent with this as well.  He is chronically anemic.  Has myelodysplastic syndrome.  Denies any chest pain.  Suffered a stroke in March 2023.  Has residual left-sided deficits.  Reports energy is low.  Concerns for pulmonary hypertension on echo as well as CT.  He has evidence of increased lower extremity edema today on examination.  His EKG demonstrates A-fib which appears to be permanent.  No acute ischemic changes.  Old anteroseptal infarct noted.  He is a former smoker.  Denies alcohol or drug use.  He is retired.  Worked for [employer].  Also worked for [employer] in [employer].  Blood pressure is acceptable.  He is diabetic with a controlled A1c.  His most recent LDL was 68.  He was admitted to the hospital with hepatitis which was related to sepsis.  We will reach out to his gastroenterologist determine if he can be on a statin.  Denies any chest pain today.  Reports he can get short of breath with activity.  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 Permanent Afib -s/p Watchman 06/2022 3. CAD -CAC 5415 (97th percentile)  4. Moderate aortic stenosis  -MG 19 mmHG, AVA 1.03 cm2 02/11/2022 5.  HTN 6. HLD -T chol 123, HDL 40, LDL 68, TG 73 7. MDS 8. Anemia 9. CKD IIIa 10. DM -A1c 5.7  Past Medical History: Past Medical History:  Diagnosis Date   Anemia    Aortic stenosis    Arthritis    Complication of anesthesia    hard time getting bp up after knee replacement   Coronary artery disease    Diabetes mellitus without complication (HCC)    GERD (gastroesophageal reflux disease)    occ tums prn   History of hiatal hernia    Hypertension    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[place])    Presence of Watchman left atrial appendage closure device 07/26/2022   33m Watchman FLX with Dr. LQuentin Ore   Past Surgical History: Past Surgical History:  Procedure Laterality Date   CARPAL TUNNEL RELEASE  2012   CRANIOTOMY N/A 02/10/2022   Procedure: SUBOCCIPITAL CRANIECTOMY FOR EVACUATION OF CEREBELLAR HEMATOMA;  Surgeon: EKristeen Miss MD;  [facility]  Service: Neurosurgery;  Laterality: N/A;   JOINT REPLACEMENT Right 2010   LEFT ATRIAL APPENDAGE OCCLUSION N/A 07/26/2022   Procedure: LEFT ATRIAL APPENDAGE OCCLUSION;  Surgeon: LVickie Epley MD;  [facility];  Service: Cardiovascular;  Laterality: N/A;   SHOULDER ARTHROSCOPY WITH ROTATOR CUFF REPAIR AND OPEN BICEPS TENODESIS Right 11/09/2019   Procedure: RIGHT SHOULDER  ARTHROSCOPY WITH SUBSCAPULARIS REPAIR, SUBACROMIAL DECOMPRESSION,MINI OPEN ROTATOR CUFF REPAIR;  Surgeon: Leim Fabry, MD;  Location: [facility];  Service: Orthopedics;  Laterality: Right;   TEE WITHOUT CARDIOVERSION N/A 07/26/2022   Procedure: TRANSESOPHAGEAL ECHOCARDIOGRAM (TEE);  Surgeon: Vickie Epley, MD;  Location: [facility];  Service: Cardiovascular;  Laterality: N/A;    Current Medications: Current Meds  Medication Sig   aspirin EC 81 MG tablet Take 1 tablet (81 mg total) by mouth daily. Swallow whole.   furosemide (LASIX) 20 MG tablet Take 1 tablet (20 mg total) by mouth daily as needed.   metFORMIN (GLUCOPHAGE) 500 MG  tablet Take 500 mg by mouth 2 (two) times daily with a meal.   Multiple Vitamin (MULTI-VITAMIN) tablet Take 1 tablet by mouth daily.   ondansetron (ZOFRAN-ODT) 4 MG disintegrating tablet Take 4 mg by mouth every 8 (eight) hours as needed.   polyethylene glycol powder (GLYCOLAX/MIRALAX) 17 GM/SCOOP powder Take 1 Container by mouth daily as needed.   Current Facility-Administered Medications for the 01/14/23 encounter (Office Visit) with Geralynn Rile, MD  Medication   0.9 %  sodium chloride infusion   0.9 %  sodium chloride infusion     Allergies:    Patient has no known allergies.   Social History: Social History   Socioeconomic History   Marital status: Married    Spouse name: Not on file   Number of children: 2   Years of education: Not on file   Highest education level: Not on file  Occupational History   Occupation: Retired - [employer]  Tobacco Use   Smoking status: Former    Packs/day: 0.50    Years: 8.00    Total pack years: 4.00    Types: Cigarettes    Quit date: 07/21/1978    Years since quitting: 44.5   Smokeless tobacco: Never  Vaping Use   Vaping Use: Never used  Substance and Sexual Activity   Alcohol use: Yes    Alcohol/week: 0.0 - 1.0 standard drinks of alcohol    Comment: rare beer   Drug use: Never   Sexual activity: Not on file  Other Topics Concern   Not on file  Social History Narrative   Lives in [place]; with wife; quit smoking in early 68s; ocassional/ rare [may be 1 a month beer]. 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    Social Determinants of Health   Financial Resource Strain: Not on file  Food Insecurity: No Food Insecurity (12/18/2022)   Hunger Vital Sign    Worried About Running Out of Food in the Last Year: Never true    Ran Out of Food in the Last Year: Never true  Transportation Needs: No Transportation Needs (12/18/2022)   PRAPARE - Hydrologist (Medical): No    Lack of Transportation  (Non-Medical): No  Physical Activity: Not on file  Stress: Not on file  Social Connections: Not on file     Family History: The patient's family history includes Cancer in his maternal uncle.  ROS:   All other ROS reviewed and negative. Pertinent positives noted in the HPI.     EKGs/Labs/Other Studies Reviewed:   The following studies were personally reviewed by me today:  EKG:  EKG is ordered today.  The ekg ordered today demonstrates atrial fibrillation heart rate 64, old anteroseptal infarct, and was personally reviewed by me.   Recent Labs: 02/12/2022: B Natriuretic Peptide 854.2 12/19/2022: Magnesium 1.6 01/09/2023: ALT 21;  BUN 25; Creatinine, Ser 1.25; Hemoglobin 8.7; Platelets 250; Potassium 4.5; Sodium 140   Recent Lipid Panel    Component Value Date/Time   CHOL 187 12/19/2022 1029   TRIG 128 12/19/2022 1029   HDL 25 (L) 12/19/2022 1029   CHOLHDL 7.5 12/19/2022 1029   VLDL 26 12/19/2022 1029   LDLCALC 136 (H) 12/19/2022 1029    Physical Exam:   VS:  BP 138/70 (BP Location: Left Arm, Patient Position: Sitting, Cuff Size: Normal)   Pulse 64   Ht 5' 9"$  (1.753 m)   Wt 200 lb (90.7 kg)   BMI 29.53 kg/m    Wt Readings from Last 3 Encounters:  01/14/23 200 lb (90.7 kg)  01/08/23 196 lb (88.9 kg)  12/17/22 204 lb (92.5 kg)    General: Well nourished, well developed, in no acute distress Head: Atraumatic, normal size  Eyes: PEERLA, EOMI  Neck: Supple, no JVD Endocrine: No thryomegaly Cardiac: Normal S1, S2; irregular rhythm, 3 out of 6 systolic ejection murmur, early peaking Lungs: Clear to auscultation bilaterally, no wheezing, rhonchi or rales  Abd: Soft, nontender, no hepatomegaly  Ext: Trace edema Musculoskeletal: No deformities, BUE and BLE strength normal and equal Skin: Warm and dry, no rashes   Neuro: Alert and oriented to person, place, time, and situation, CNII-XII grossly intact, no focal deficits  Psych: Normal mood and affect   ASSESSMENT:   Vernon [name] is a 76 y.o. male who presents for the following: 1. SOB (shortness of breath) on exertion   2. Coronary artery disease involving native coronary artery of native heart without angina pectoris   3. Agatston coronary artery calcium score greater than 400   4. Mixed hyperlipidemia   5. Moderate aortic valve stenosis   6. Permanent atrial fibrillation ([place])   7. Acquired thrombophilia ([place])     PLAN:   1. SOB (shortness of breath) on exertion 2. Coronary artery disease involving native coronary artery of native heart without angina pectoris 3. Agatston coronary artery calcium score greater than 400 4. Mixed hyperlipidemia -Severely elevated coronary calcium score.  He is short of breath with activity.  No chest pain.  His EKG is abnormal.  He is in A-fib.  He is not a candidate for an exercise treadmill test.  I have recommended cardiac PET scan for further evaluation to exclude obstructive CAD.  Given his high calcium score he has very high risk to have obstructive CAD.  Also needs a repeat echo to reevaluate his heart valve.  Moderate last year.  Still sounds moderate on examination today.  It is okay for him to restart an aspirin 81 mg daily.  Recently admitted for hepatitis related to sepsis.  We will reach out to his gastroenterologist to determine if he can be on a statin.  He will see me after his above testing.  He also has evidence of lower extremity edema.  I recommended Lasix as needed as well as a BNP today.  He has evidence of pulmonary hypertension on CT.  He may have diastolic heart failure.  5. Moderate aortic valve stenosis -Repeat echo.  6. Permanent atrial fibrillation ([place]) 7. Acquired thrombophilia ([place]) -History of A-fib.  Status post watchman due to stroke related to anticoagulation use.  Rate controlled on no medications.  We are starting aspirin for likely underlying CAD.   Shared Decision Making/Informed Consent The risks [chest pain, shortness of breath,  cardiac arrhythmias, dizziness, blood pressure fluctuations, myocardial infarction, stroke/transient ischemic attack, nausea,  vomiting, allergic reaction, radiation exposure, metallic taste sensation and life-threatening complications (estimated to be 1 in 10,000)], benefits (risk stratification, diagnosing coronary artery disease, treatment guidance) and alternatives of a cardiac PET stress test were discussed in detail with Vernon [name] and he agrees to proceed.  Disposition: Return in about 3 months (around 04/14/2023).  Medication Adjustments/Labs and Tests Ordered: Current medicines are reviewed at length with the patient today.  Concerns regarding medicines are outlined above.  Orders Placed This Encounter  Procedures   NM PET CT CARDIAC PERFUSION MULTI W/ABSOLUTE BLOODFLOW   Brain natriuretic peptide   EKG 12-Lead   ECHOCARDIOGRAM COMPLETE   Meds ordered this encounter  Medications   furosemide (LASIX) 20 MG tablet    Sig: Take 1 tablet (20 mg total) by mouth daily as needed.    Dispense:  90 tablet    Refill:  3   aspirin EC 81 MG tablet    Sig: Take 1 tablet (81 mg total) by mouth daily. Swallow whole.    Dispense:  90 tablet    Refill:  3    Patient Instructions  Medication Instructions:  START Lasix 20 mg as needed.  START Aspirin 81 mg daily   *If you need a refill on your cardiac medications before your next appointment, please call your pharmacy*   Lab Work: BNP today   If you have labs (blood work) drawn today and your tests are completely normal, you will receive your results only by: [place] (if you have MyChart) OR A paper copy in the mail If you have any lab test that is abnormal or we need to change your treatment, we will call you to review the results.   Testing/Procedures:  CARDIAC PET- Your physician has requested that you have a Cardiac Pet Stress Test. This testing is completed at [facility] ([place],  [place] [address]). The schedulers will call you to get this scheduled. Please follow instructions below and call the office with any questions/concerns [phone]).  Echocardiogram - Your physician has requested that you have an echocardiogram. Echocardiography is a painless test that uses sound waves to create images of your heart. It provides your doctor with information about the size and shape of your heart and how well your heart's chambers and valves are working. This procedure takes approximately one hour. There are no restrictions for this procedure.    Follow-Up: At [facility], you and your health needs are our priority.  As part of our continuing mission to provide you with exceptional heart care, we have created designated Provider Care Teams.  These Care Teams include your primary Cardiologist (physician) and Advanced Practice Providers (APPs -  Physician Assistants and Nurse Practitioners) who all work together to provide you with the care you need, when you need it.  We recommend signing up for the patient portal called "MyChart".  Sign up information is provided on this After Visit Summary.  MyChart is used to connect with patients for Virtual Visits (Telemedicine).  Patients are able to view lab/test results, encounter notes, upcoming appointments, etc.  Non-urgent messages can be sent to your provider as well.   To learn more about what you can do with MyChart, go to [email or web address].    Your next appointment:   3 month(s)  Provider:   Eleonore Chiquito, MD  Other Instructions   How to Prepare for Your Cardiac PET/CT Stress Test:  1. Please do not take these medications before your  test:   Medications that may interfere with the cardiac pharmacological stress agent (ex. nitrates - including erectile dysfunction medications, isosorbide mononitrate, tamulosin or beta-blockers) the day of the exam. (Erectile dysfunction medication should be held for at least  72 hrs prior to test) Theophylline containing medications for 12 hours. Dipyridamole 48 hours prior to the test. Your remaining medications may be taken with water.  2. Nothing to eat or drink, except water, 3 hours prior to arrival time.   NO caffeine/decaffeinated products, or chocolate 12 hours prior to arrival.  3. NO perfume, cologne or lotion  4. Total time is 1 to 2 hours; you may want to bring reading material for the waiting time.  5. Please report to Radiology at the [facility] Main Entrance 30 minutes early for your test.  [place], [place] [address]  Diabetic Preparation:  Hold oral medications. You may take NPH and Lantus insulin. Do not take Humalog or Humulin R (Regular Insulin) the day of your test. Check blood sugars prior to leaving the house. If able to eat breakfast prior to 3 hour fasting, you may take all medications, including your insulin, Do not worry if you miss your breakfast dose of insulin - start at your next meal.  IF YOU THINK YOU MAY BE PREGNANT, OR ARE NURSING PLEASE INFORM THE TECHNOLOGIST.  In preparation for your appointment, medication and supplies will be purchased.  Appointment availability is limited, so if you need to cancel or reschedule, please call the Radiology Department at [phone]  24 hours in advance to avoid a cancellation fee of $100.00  What to Expect After you Arrive:  Once you arrive and check in for your appointment, you will be taken to a preparation room within the Radiology Department.  A technologist or Nurse will obtain your medical history, verify that you are correctly prepped for the exam, and explain the procedure.  Afterwards,  an IV will be started in your arm and electrodes will be placed on your skin for EKG monitoring during the stress portion of the exam. Then you will be escorted to the PET/CT scanner.  There, staff will get you positioned on the scanner and obtain a blood pressure and  EKG.  During the exam, you will continue to be connected to the EKG and blood pressure machines.  A small, safe amount of a radioactive tracer will be injected in your IV to obtain a series of pictures of your heart along with an injection of a stress agent.    After your Exam:  It is recommended that you eat a meal and drink a caffeinated beverage to counter act any effects of the stress agent.  Drink plenty of fluids for the remainder of the day and urinate frequently for the first couple of hours after the exam.  Your doctor will inform you of your test results within 7-10 business days.  For questions about your test or how to prepare for your test, please call: Marchia Bond, Cardiac Imaging Nurse Navigator  [name], Cardiac Imaging Nurse Navigator Office: [phone]      Signed, Addison Naegeli. Audie Box, MD, [place]  [address], [place],  [address] [phone]  01/14/2023 1:53 PM

## 2023-01-14 ENCOUNTER — Ambulatory Visit: Payer: Medicare Other | Attending: Cardiovascular Disease | Admitting: Cardiovascular Disease

## 2023-01-14 ENCOUNTER — Ambulatory Visit: Payer: Medicare Other

## 2023-01-14 ENCOUNTER — Encounter: Payer: Self-pay | Admitting: Cardiovascular Disease

## 2023-01-14 VITALS — BP 138/70 | HR 64 | Ht 69.0 in | Wt 200.0 lb

## 2023-01-14 DIAGNOSIS — I2510 Atherosclerotic heart disease of native coronary artery without angina pectoris: Secondary | ICD-10-CM

## 2023-01-14 DIAGNOSIS — R278 Other lack of coordination: Secondary | ICD-10-CM

## 2023-01-14 DIAGNOSIS — E782 Mixed hyperlipidemia: Secondary | ICD-10-CM | POA: Diagnosis not present

## 2023-01-14 DIAGNOSIS — M6281 Muscle weakness (generalized): Secondary | ICD-10-CM | POA: Diagnosis not present

## 2023-01-14 DIAGNOSIS — R931 Abnormal findings on diagnostic imaging of heart and coronary circulation: Secondary | ICD-10-CM | POA: Diagnosis not present

## 2023-01-14 DIAGNOSIS — R0602 Shortness of breath: Secondary | ICD-10-CM

## 2023-01-14 DIAGNOSIS — I4821 Permanent atrial fibrillation: Secondary | ICD-10-CM

## 2023-01-14 DIAGNOSIS — D6869 Other thrombophilia: Secondary | ICD-10-CM

## 2023-01-14 DIAGNOSIS — I69254 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: Secondary | ICD-10-CM

## 2023-01-14 DIAGNOSIS — I350 Nonrheumatic aortic (valve) stenosis: Secondary | ICD-10-CM

## 2023-01-14 LAB — ALPHA-1-ANTITRYPSIN: A-1 Antitrypsin, Ser: 184 mg/dL (ref 101–187)

## 2023-01-14 LAB — CERULOPLASMIN: Ceruloplasmin: 36.7 mg/dL — ABNORMAL HIGH (ref 16.0–31.0)

## 2023-01-14 LAB — CELIAC DISEASE PANEL
Endomysial Ab, IgA: NEGATIVE
IgA: 183 mg/dL (ref 61–437)
Tissue Transglutaminase Ab, IgA: <2 U/mL (ref 0–3)

## 2023-01-14 LAB — ANTI-SMOOTH MUSCLE ANTIBODY, IGG: F-Actin IgG: 6 Units (ref 0–19)

## 2023-01-14 LAB — MITOCHONDRIAL ANTIBODIES: Mitochondrial M2 Ab, IgG: <20 Units (ref 0.0–20.0)

## 2023-01-14 MED ORDER — ASPIRIN 81 MG PO TBEC: 81.0000 mg | DELAYED_RELEASE_TABLET | Freq: Every day | ORAL | 3 refills | Status: AC

## 2023-01-14 MED ORDER — FUROSEMIDE 20 MG PO TABS: 20.0000 mg | ORAL_TABLET | Freq: Every day | ORAL | 3 refills | Status: DC | PRN

## 2023-01-14 NOTE — Patient Instructions (Addendum)
Medication Instructions:  START Lasix 20 mg as needed.  START Aspirin 81 mg daily   *If you need a refill on your cardiac medications before your next appointment, please call your pharmacy*   Lab Work: BNP today   If you have labs (blood work) drawn today and your tests are completely normal, you will receive your results only by: [place] (if you have MyChart) OR A paper copy in the mail If you have any lab test that is abnormal or we need to change your treatment, we will call you to review the results.   Testing/Procedures:  CARDIAC PET- Your physician has requested that you have a Cardiac Pet Stress Test. This testing is completed at [facility] ([place], [place] [address]). The schedulers will call you to get this scheduled. Please follow instructions below and call the office with any questions/concerns [phone]).  Echocardiogram - Your physician has requested that you have an echocardiogram. Echocardiography is a painless test that uses sound waves to create images of your heart. It provides your doctor with information about the size and shape of your heart and how well your heart's chambers and valves are working. This procedure takes approximately one hour. There are no restrictions for this procedure.    Follow-Up: At [facility], you and your health needs are our priority.  As part of our continuing mission to provide you with exceptional heart care, we have created designated Provider Care Teams.  These Care Teams include your primary Cardiologist (physician) and Advanced Practice Providers (APPs -  Physician Assistants and Nurse Practitioners) who all work together to provide you with the care you need, when you need it.  We recommend signing up for the patient portal called "MyChart".  Sign up information is provided on this After Visit Summary.  MyChart is used to connect with patients for Virtual Visits  (Telemedicine).  Patients are able to view lab/test results, encounter notes, upcoming appointments, etc.  Non-urgent messages can be sent to your provider as well.   To learn more about what you can do with MyChart, go to [email or web address].    Your next appointment:   3 month(s)  Provider:   Eleonore Chiquito, MD  Other Instructions   How to Prepare for Your Cardiac PET/CT Stress Test:  1. Please do not take these medications before your test:   Medications that may interfere with the cardiac pharmacological stress agent (ex. nitrates - including erectile dysfunction medications, isosorbide mononitrate, tamulosin or beta-blockers) the day of the exam. (Erectile dysfunction medication should be held for at least 72 hrs prior to test) Theophylline containing medications for 12 hours. Dipyridamole 48 hours prior to the test. Your remaining medications may be taken with water.  2. Nothing to eat or drink, except water, 3 hours prior to arrival time.   NO caffeine/decaffeinated products, or chocolate 12 hours prior to arrival.  3. NO perfume, cologne or lotion  4. Total time is 1 to 2 hours; you may want to bring reading material for the waiting time.  5. Please report to Radiology at the [facility] Main Entrance 30 minutes early for your test.  [place], [place] [address]  Diabetic Preparation:  Hold oral medications. You may take NPH and Lantus insulin. Do not take Humalog or Humulin R (Regular Insulin) the day of your test. Check blood sugars prior to leaving the house. If able to eat breakfast prior to 3 hour  fasting, you may take all medications, including your insulin, Do not worry if you miss your breakfast dose of insulin - start at your next meal.  IF YOU THINK YOU MAY BE PREGNANT, OR ARE NURSING PLEASE INFORM THE TECHNOLOGIST.  In preparation for your appointment, medication and supplies will be purchased.  Appointment availability is  limited, so if you need to cancel or reschedule, please call the Radiology Department at [phone]  24 hours in advance to avoid a cancellation fee of $100.00  What to Expect After you Arrive:  Once you arrive and check in for your appointment, you will be taken to a preparation room within the Radiology Department.  A technologist or Nurse will obtain your medical history, verify that you are correctly prepped for the exam, and explain the procedure.  Afterwards,  an IV will be started in your arm and electrodes will be placed on your skin for EKG monitoring during the stress portion of the exam. Then you will be escorted to the PET/CT scanner.  There, staff will get you positioned on the scanner and obtain a blood pressure and EKG.  During the exam, you will continue to be connected to the EKG and blood pressure machines.  A small, safe amount of a radioactive tracer will be injected in your IV to obtain a series of pictures of your heart along with an injection of a stress agent.    After your Exam:  It is recommended that you eat a meal and drink a caffeinated beverage to counter act any effects of the stress agent.  Drink plenty of fluids for the remainder of the day and urinate frequently for the first couple of hours after the exam.  Your doctor will inform you of your test results within 7-10 business days.  For questions about your test or how to prepare for your test, please call: Marchia Bond, Cardiac Imaging Nurse Navigator  [name], Cardiac Imaging Nurse Navigator Office: [phone]

## 2023-01-15 ENCOUNTER — Telehealth: Payer: Self-pay

## 2023-01-15 LAB — BRAIN NATRIURETIC PEPTIDE: BNP: 341.9 pg/mL — ABNORMAL HIGH (ref 0.0–100.0)

## 2023-01-15 NOTE — Telephone Encounter (Signed)
-----   Message from Geralynn Rile, MD sent at 01/14/2023  4:08 PM EST ----- Regarding: RE: [employer] patient Statin ok by liver doctor. Can we start lipitor 20 mg and re check lipids/liver in 4 weeks?  [place] T. Audie Box, MD, [place]  [address], [place], [place] [address] [phone]  4:09 PM  ----- Message ----- From: Lin Landsman, MD Sent: 01/14/2023   3:59 PM EST To: Geralynn Rile, MD Subject: RE: [employer] patient                             We can.  Please make sure to check his LFTs in 4 weeks  RV ----- Message ----- From: Geralynn Rile, MD Sent: 01/14/2023   1:34 PM EST To: Lin Landsman, MD Subject: [employer] patient                                 Dr. Marius Ditch:  This is a [facility] patient.  He has a very elevated calcium score and would benefit from statin therapy.  Would it be okay for him to start a statin given his recent liver dysfunction?  Thanks in advance.  -Wes  [place] T. Audie Box, MD, [place]  [address], [place], [place] [address] [phone]  1:34 PM

## 2023-01-15 NOTE — Telephone Encounter (Signed)
Called patient, spoke with him and wife- they advised that his kidney doctor advised him not to take a statin, they would like to discuss other alternative options than a statin medication.   I advised I would route to MD to review-   Patient states he is very discouraged, he is not sure what else to do.   [facility] is who his kidney provider is with, they would like [place] to contact their office. I attempted to contact their office to receive the information from the office- LVM for them to contact me back.   He was originally on: Simvastatin 80 mg- take 0.5 tablet every evening before bed.

## 2023-01-15 NOTE — Therapy (Signed)
OUTPATIENT OCCUPATIONAL THERAPY NEURO TREATMENT  Patient Name: Vernon [name] MRN: [id number] DOB:1946-05-20, 77 y.o., male Today's Date: 01/15/2023  REFERRING PROVIDER:  Harrel Lemon, MD  END OF SESSION:  OT End of Session - 01/15/23 0854     Visit Number 3    Number of Visits 24    Date for OT Re-Evaluation 04/01/23    Authorization Time Period Progress reporting period starting 01/07/2023    OT Start Time 1515    OT Stop Time 1600    OT Time Calculation (min) 45 min    Equipment Utilized During Treatment transport chair    Activity Tolerance Patient tolerated treatment well    Behavior During Therapy WFL for tasks assessed/performed             Past Medical History:  Diagnosis Date   Anemia    Aortic stenosis    Arthritis    Complication of anesthesia    hard time getting bp up after knee replacement   Coronary artery disease    Diabetes mellitus without complication (HCC)    GERD (gastroesophageal reflux disease)    occ tums prn   History of hiatal hernia    Hypertension    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[place])    Presence of Watchman left atrial appendage closure device 07/26/2022   4m Watchman FLX with Dr. 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;  Surgeon: EKristeen Miss MD;  [facility]  Service: Neurosurgery;  Laterality: N/A;   JOINT REPLACEMENT Right 2010   LEFT ATRIAL APPENDAGE OCCLUSION N/A 07/26/2022   Procedure: LEFT ATRIAL APPENDAGE OCCLUSION;  Surgeon: LVickie Epley MD;  [facility];  Service: Cardiovascular;  Laterality: N/A;   SHOULDER ARTHROSCOPY WITH ROTATOR CUFF REPAIR AND OPEN BICEPS TENODESIS Right 11/09/2019   Procedure: RIGHT SHOULDER ARTHROSCOPY WITH SUBSCAPULARIS REPAIR, SUBACROMIAL DECOMPRESSION,MINI OPEN ROTATOR CUFF REPAIR;  Surgeon: PLeim Fabry MD;   Location: [facility];  Service: Orthopedics;  Laterality: Right;   TEE WITHOUT CARDIOVERSION N/A 07/26/2022   Procedure: TRANSESOPHAGEAL ECHOCARDIOGRAM (TEE);  Surgeon: LVickie Epley MD;  [facility];  Service: Cardiovascular;  Laterality: N/A;   Patient Active Problem List   Diagnosis Date Noted   Urinary tract infection without hematuria 12/19/2022   Painless jaundice 12/18/2022   Elevated LFTs 12/18/2022   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   Obstructive jaundice 12/17/2022   [place]rial fibrillation [place] 07/26/2022   Presence of Watchman left atrial appendage closure device 07/26/2022   Proteinuria, unspecified 05/23/2022   Simple renal cyst 05/23/2022   Long term current use of anticoagulant 04/04/2022   Rotator cuff syndrome 04/04/2022   Exposure to potentially hazardous substance 04/04/2022   Gout 04/04/2022   Osteoarthritis 04/04/2022   Osteoporosis 04/04/2022   Other specified diseases of hair and hair follicles 00000000  Pain in joint, lower leg 04/04/2022   Problem related to unspecified psychosocial circumstances 04/04/2022   General medical examination for administrative purposes 04/04/2022   Stage 3b chronic kidney disease ([place]    Dysphagia, post-stroke    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 hemorrhage of brain ([place] 02/26/2022   Cough [id number]  Acute metabolic encephalopathy [id number]  Obstructive hydrocephalus ([place] 02/19/2022   Dyslipidemia 02/19/2022   Dysphagia 02/19/2022   AKI (acute kidney injury) ([place]    Anemia    Pleural effusion on right  Respiratory failure requiring intubation (HCC)    Cerebral edema (HCC)    Chronic atrial fibrillation (HCC)    Controlled type 2 diabetes mellitus with hyperglycemia, without long-term current use of insulin (HCC)    ICH (intracerebral hemorrhage) ([place]) 02/10/2022   Intracranial hemorrhage (HCC)    Anticoagulated    Moderate tricuspid regurgitation 07/26/2021   Moderate aortic valve stenosis 03/08/2020    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[place]) 02/18/2020   Macrocytic anemia 01/13/2020   Spleen enlargement 01/13/2020   Bilateral carotid artery stenosis 07/14/2018   Atrial fibrillation, chronic ([place]) 07/14/2018   Type 2 diabetes with nephropathy ([place]) 02/14/2016   Essential hypertension 08/16/2014   Hyperlipemia, mixed 08/16/2014   Renal insufficiency 08/16/2014    ONSET DATE: 02/10/2022  REFERRING DIAG:  CVA  THERAPY DIAG:  Coordination Muscle weakness (generalized)  Other lack of coordination  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   Pt accompanied by: significant other  PERTINENT HISTORY: Pt. is a 50 y. 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 Pt. received outpt. OT services following the CVA for Left sided weakness, and incoordination. Pt. was recently hospitalized from 1/22-1/25/24 for Obstructive Jaundice. PMHx includes: MDS, Chronic A-Fib, Essential HTN, Type II DM, Dyslipidemia, Presence of Watchman Left atrial appendage closure device, GERD  PRECAUTIONS: None  WEIGHT BEARING RESTRICTIONS: No  PAIN:  Are you having pain? No  FALLS: Has patient fallen in last 6 months? No  LIVING ENVIRONMENT: Lives with: lives with their spouse Lives in: House/apartment one storey, can fully live on the 1st floor. Stairs: Ramped entrance  Has following equipment at home: 4 wheeled walker, w/c-manual, grab bars, shower chair, HH shower head, toilet riser with arms.  PLOF: Independent  PATIENT GOALS: To improve arm and hand function   OBJECTIVE:   HAND DOMINANCE: Right  ADLs: Transfers/ambulation related to ADLs: Eating: Difficulty with motor control control Grooming:   Independent UB Dressing:  Diffiulty buttoning when no looking directly at the buttons.  LB Dressing: Independent donning pants, socks, and slide on shoes. Toileting: Independent Bathing: Independent, wife assists with towel. Tub Shower transfers: Modified Independence  in the shower, CGA transfers out of shoer.    IADLs: Shopping:  Pt.'s wife performs, occasionally uses grocery store scooters Light housekeeping: Wife performs housekeeping tasks. Meal Prep: Prepares light snacks, and light meal prep Community mobility: Relies on family and friends Medication management: Independent Financial management: Does  with wife; No changes in how they are doing them Handwriting: 100% legible for name: Wife has noticed some changes with writing  Hobbies: Plays the guitar, workshop, music shop.  MOBILITY STATUS: Needs Assist: uses a rollator  POSTURE COMMENTS:  No Significant postural limitations Sitting balance: WFL  ACTIVITY TOLERANCE: Activity tolerance:  Limited  FUNCTIONAL OUTCOME MEASURES:  FOTO: 59; TR score: 60   UPPER EXTREMITY ROM:    Active ROM Right Eval  Left Eval [facility]  Shoulder flexion 106   Shoulder abduction 85   Shoulder adduction    Shoulder extension    Shoulder internal rotation    Shoulder external rotation    Elbow flexion WFL   Elbow extension WFL   Wrist flexion    Wrist extension WFL   Wrist ulnar deviation    Wrist radial deviation  Wrist pronation    Wrist supination WFL   (Blank rows = not tested)  Digit flexion to the [facility]:  2nd: 0cm, 3rd: 1.5cm, 4th: 2.5cm, 5th: 0cm   UPPER EXTREMITY MMT:     MMT Right eval Left eval  Shoulder flexion 3- 4  Shoulder abduction 3- 4  Shoulder adduction    Shoulder extension    Shoulder internal rotation    Shoulder external rotation    Middle trapezius    Lower trapezius    Elbow flexion 4 4  Elbow extension 4 4  Wrist flexion    Wrist extension 4 4  Wrist ulnar deviation    Wrist radial  deviation    Wrist pronation    Wrist supination    (Blank rows = not tested)  HAND FUNCTION: Grip strength: N/A secondary to arthritis, and 3rd digit trigger finger    Lateral Pinch: R: 23#, L: 20#  3 Pt. Pinch: R: 22#, L: 21#  COORDINATION: 9 Hole Peg test: Right: 25 sec; Left: 2 min. & 34  sec  SENSATION: WFL Light touch: WFL Stereognosis: WFL  EDEMA:   N/A  MUSCLE TONE:  Intact  COGNITION: Overall cognitive status: Within functional limits for tasks assessed  VISION: Subjective report: Pt. reports double vision initially at the onset of the CVA with difficulty scanning to left when reading. Pt. Reports this has improved, and the double vision has resolved.   PERCEPTION: WFL  PRAXIS: Impaired motor control, and coordination   TODAY'S TREATMENT:                                                                                                                              DATE: 01/09/2023  Neuromuscular re-education: Pt worked on goal directed reaching for targets to place glass stones into a cup held in forward and lateral planes.  Pt practiced picking up the stones 1 at a time from the table flat side down, storing 2 in hand, then practiced translatory skills moving stones from palm to fingertips in prep to discard into the cup.  Pt practiced weightbearing through the forearm on table top while grasping a paper cup, a cone, a 2 lb weight, and a 4 lb weight, alternating objects to challenge graded pressure while moving these objects from one target to another on table top.  Pt practiced pouring contents of a small paper cup from 1 cup to another between 2 hands (cup 1/2 full of glass stones); OT added stones and removed stones to further challenge graded pressure.  PATIENT EDUCATION: Education details:  Proximal stabilization  Person educated: Patient and Spouse Education method: Explanation, Demonstration, and Tactile cues Education comprehension: verbalized understanding,  returned demonstration, verbal cues required, tactile cues required, and needs further education  Home Exercise Program:   Continue to assess and provide as indicated  GOALS: Goals reviewed with patient?  SHORT TERM GOALS: Target date: 02/18/2023    Pt. will demonstrate independence with HEP  for LUE functioning Baseline: Eval: No current HEP Goal status: INITIAL  LONG TERM GOALS: Target date:  04/01/2023    Pt. will improve FOTO score by 1 point for patient perceived improvement with assessment specific ADLs, and IADLs.  Baseline: Eval: FOTO score: 59; TR score: 60 Goal status: INITIAL  2.  Pt. will independently modulate the accuracy of holding items without overgripping when reaching for items 50% of the time Baseline: Eval: Pt. is consistently overpowering, and over gripping items when grasping them with the left hand Goal status: INITIAL  3.  Pt. will improve left hand [facility] skills by 10 sec. to improve left hand grasp on objects.  Baseline: Eval: R: 25 sec. L: 2 min. & 34 sec.  Goal status: INITIAL  4.  Pt. Will utilize compensatory strategies for left hand function motor coordination 75% during ADLs, and IADL tasks. Baseline: Eval: Minimal use of compensatory strategies during ADLs, and IADLs. Goal status: INITIAL  5.  Pt. Will use compensatory strategies for left hand function 75% of the time when utilizing multiple body systems, dual tasking, and using the hand in a variety of different contexts. Baseline: Eval: Pt. Consistently has difficulty using his left hand to complete tasks when he is not looking directly at his hand, and has more difficulty using his left hand when standing, or attempting to walk with the walker.  Goal status: INITIAL  ASSESSMENT:   CLINICAL IMPRESSION: Pt. required intermittent verbal and visual cues to increase WB through the forearm to increase distal control while reaching to pick up a cup from the table top.  More WBing was required as pt  fatigued as motor control and accuracy decreased.  Continued to review benefits of proximal stabilization for more distal control.  Pt continues to require work on controlling, and modulating graded pressure on the objects within his left hand, noting significant difficulty grasping a paper cup as the cup tends to collapse.  Pt performed better when increasing WB through the forearm and when given cues to grasp cup from the bottom vs the top.  Pt continues to benefit form OT services to improve Left hand function, and [facility] skills in order to be able to use his hand when challenged in multiple contexts, and with dual tasking. Pt. will benefit from education about compensatory strategies for left hand function during ADLs, and IADLs.    PERFORMANCE DEFICITS: in functional skills including ADLs, IADLs, coordination, dexterity, ROM, strength, Fine motor control, and Gross motor control, cognitive skills including attention, memory, problem solving, and safety awareness, and psychosocial skills including coping strategies, environmental adaptation, habits, and routines and behaviors.   IMPAIRMENTS: are limiting patient from ADLs, IADLs, play, leisure, and social participation.   CO-MORBIDITIES: may have co-morbidities  that affects occupational performance. Patient will benefit from skilled OT to address above impairments and improve overall function.  MODIFICATION OR ASSISTANCE TO COMPLETE EVALUATION: Min-Moderate modification of tasks or assist with assess necessary to complete an evaluation.  OT OCCUPATIONAL PROFILE AND HISTORY: Detailed assessment: Review of records and additional review of physical, cognitive, psychosocial history related to current functional performance.  CLINICAL DECISION MAKING: Moderate - several treatment options, min-mod task modification necessary  REHAB POTENTIAL: Good  EVALUATION COMPLEXITY: Moderate    PLAN:  OT FREQUENCY: 2x/week  OT DURATION: 12 weeks  PLANNED  INTERVENTIONS: self care/ADL training, therapeutic exercise, therapeutic activity, patient/family education, cognitive remediation/compensation, and DME and/or AE instructions, neuromuscular re-education  RECOMMENDED OTHER SERVICES: OT services.  CONSULTED AND AGREED  WITH PLAN OF CARE: Patient and family member/caregiver-wife  PLAN FOR NEXT SESSION: Initiate treatment  Leta Speller, MS, OTR/L   Darleene Cleaver, OT 01/15/2023, 9:35 AM

## 2023-01-16 ENCOUNTER — Ambulatory Visit: Payer: Medicare Other

## 2023-01-16 DIAGNOSIS — M6281 Muscle weakness (generalized): Secondary | ICD-10-CM | POA: Diagnosis not present

## 2023-01-16 DIAGNOSIS — R2681 Unsteadiness on feet: Secondary | ICD-10-CM

## 2023-01-16 DIAGNOSIS — I69254 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: Secondary | ICD-10-CM

## 2023-01-16 DIAGNOSIS — R262 Difficulty in walking, not elsewhere classified: Secondary | ICD-10-CM

## 2023-01-16 DIAGNOSIS — R2689 Other abnormalities of gait and mobility: Secondary | ICD-10-CM

## 2023-01-16 DIAGNOSIS — R278 Other lack of coordination: Secondary | ICD-10-CM

## 2023-01-16 NOTE — Therapy (Signed)
OUTPATIENT PHYSICAL THERAPY NEURO TREATMENT   Patient Name: Vernon [name] MRN: [id number] DOB:1946-08-26, 77 y.o., male Today's Date: 01/16/2023   PCP:   Baxter Hire, MD   REFERRING PROVIDER:   Baxter Hire, MD    END OF SESSION:  PT End of Session - 01/16/23 1522     Visit Number 2    Number of Visits 25    Date for PT Re-Evaluation 04/03/23    PT Start Time 1518    PT Stop Time 1600    PT Time Calculation (min) 42 min    Equipment Utilized During Treatment Gait belt    Activity Tolerance Patient tolerated treatment well    Behavior During Therapy WFL for tasks assessed/performed              Past Medical History:  Diagnosis Date   Anemia    Aortic stenosis    Arthritis    Complication of anesthesia    hard time getting bp up after knee replacement   Coronary artery disease    Diabetes mellitus without complication (HCC)    GERD (gastroesophageal reflux disease)    occ tums prn   History of hiatal hernia    Hypertension    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[place])    Presence of Watchman left atrial appendage closure device 07/26/2022   38m Watchman FLX with Dr. 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;  Surgeon: EKristeen Miss MD;  [facility]  Service: Neurosurgery;  Laterality: N/A;   JOINT REPLACEMENT Right 2010   LEFT ATRIAL APPENDAGE OCCLUSION N/A 07/26/2022   Procedure: LEFT ATRIAL APPENDAGE OCCLUSION;  Surgeon: LVickie Epley MD;  [facility];  Service: Cardiovascular;  Laterality: N/A;   SHOULDER ARTHROSCOPY WITH ROTATOR CUFF REPAIR AND OPEN BICEPS TENODESIS Right 11/09/2019   Procedure: RIGHT SHOULDER ARTHROSCOPY WITH SUBSCAPULARIS REPAIR, SUBACROMIAL DECOMPRESSION,MINI OPEN ROTATOR CUFF REPAIR;  Surgeon: PLeim Fabry MD;  Location: [facility];  Service:  Orthopedics;  Laterality: Right;   TEE WITHOUT CARDIOVERSION N/A 07/26/2022   Procedure: TRANSESOPHAGEAL ECHOCARDIOGRAM (TEE);  Surgeon: LVickie Epley MD;  [facility];  Service: Cardiovascular;  Laterality: N/A;   Patient Active Problem List   Diagnosis Date Noted   Urinary tract infection without hematuria 12/19/2022   Painless jaundice 12/18/2022   Elevated LFTs 12/18/2022   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   Obstructive jaundice 12/17/2022   Atrial fibrillation [place] 07/26/2022   Presence of Watchman left atrial appendage closure device 07/26/2022   Proteinuria, unspecified 05/23/2022   Simple renal cyst 05/23/2022   Long term current use of anticoagulant 04/04/2022   Rotator cuff syndrome 04/04/2022   Exposure to potentially hazardous substance 04/04/2022   Gout 04/04/2022   Osteoarthritis 04/04/2022   Osteoporosis 04/04/2022   Other specified diseases of hair and hair follicles 00000000  Pain in joint, lower leg 04/04/2022   Problem related to unspecified psychosocial circumstances 04/04/2022   General medical examination for administrative purposes 04/04/2022   Stage 3b chronic kidney disease ([place]    Dysphagia, post-stroke    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 hemorrhage of brain ([place] 02/26/2022   Cough [id number]  Acute metabolic encephalopathy [id number]  Obstructive hydrocephalus ([place] 02/19/2022   Dyslipidemia 02/19/2022   Dysphagia 02/19/2022   AKI (acute kidney injury) ([place]    Anemia    Pleural effusion  on right    Respiratory failure requiring intubation (HCC)    Cerebral edema (HCC)    Chronic atrial fibrillation (HCC)    Controlled type 2 diabetes mellitus with hyperglycemia, without long-term current use of insulin (HCC)    ICH (intracerebral hemorrhage) ([place]) 02/10/2022   Intracranial hemorrhage (HCC)    Anticoagulated    Moderate tricuspid regurgitation 07/26/2021   Moderate aortic valve stenosis 03/08/2020   MDS (myelodysplastic syndrome)  ([place]) 02/18/2020   Macrocytic anemia 01/13/2020   Spleen enlargement 01/13/2020   Bilateral carotid artery stenosis 07/14/2018   Atrial fibrillation, chronic ([place]) 07/14/2018   Type 2 diabetes with nephropathy ([place]) 02/14/2016   Essential hypertension 08/16/2014   Hyperlipemia, mixed 08/16/2014   Renal insufficiency 08/16/2014    ONSET DATE: 02/10/2022  REFERRING DIAG:  I63.9 (ICD-10-CM) - Cerebral infarction, unspecified  I63.9 (ICD-10-CM) - CVA (cerebral vascular accident) ([place])    THERAPY DIAG:  Muscle weakness (generalized)  Other lack of coordination  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  Pt denies any falls since evaluation.  Pt does have some pain in the L knee due to needing the knee to be replaced.     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 Pt hospitalized 1/22-1/25/24 for obstructive Jaundice. Pt reports it was found he had a UTI with recent hospital stay. He wants to improve his endurance and balance. PMH significant for MDS, chronic a-fib, HTN, DMII, Presence of Watchman Left atrial appendage closure device, anemia, arthritis, hx of hiatal hernia, hx craniotomy 02/10/22, L atrial appendage occlusion  07/26/22, TEE without cardioversion 07/26/2022    PAIN:  Are you having pain? 6/10 in the L knee when walking.  PRECAUTIONS: Fall  WEIGHT BEARING RESTRICTIONS: No  FALLS: Has patient fallen in last 6 months? No  LIVING ENVIRONMENT: Lives with: lives with their family and lives with their spouse Lives  in:  Stairs:  Has following equipment at home: Walker - 4 wheeled  PLOF: Independent  PATIENT GOALS: improve coordination, endurance and balance  OBJECTIVE:   DIAGNOSTIC FINDINGS:   02/20/22 CT HEAD: "IMPRESSION: No evidence of interval acute intracranial abnormality.   Continued evolution of postoperative changes from prior left suboccipital craniectomy and left posterior fossa hematoma evacuation. Residual hemorrhage within the left cerebellar hemisphere, similar to slightly decreased from the prior head CT of 02/18/2022. Unchanged left cerebellar edema. Stable mass effect with partial effacement of the fourth ventricle. No evidence of hydrocephalus.   Persistent edema and trace persistent hemorrhage within the right occipital lobe along the tract from prior attempted ventricular catheter placement.   Stable chronic small vessel ischemic changes within the cerebral white matter.   Paranasal sinus disease, as described."  MR BRAIN 02/12/22: " IMPRESSION: 1. Left suboccipital craniectomy for hematoma evacuation. Roughly 13 mL residual left cerebellar blood tracking across midline to the right. Cerebellar edema with partial effacement of the 4th ventricle. But basilar cistern patency is adequate, with no ventriculomegaly.   2. Sequelae of attempted EVD placement across the splenium of the corpus callosum. Trace intraventricular and subarachnoid hemorrhage.   3. Background mild to moderate nonspecific cerebral white matter signal changes.   4. 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."  Please refer to chart for full  details    COGNITION: Overall cognitive status: Within functional limits for tasks assessed   SENSATION: WFL BLE   COORDINATION: Impaired LUE WFL BLE  EDEMA:  None observed at present    POSTURE: rounded shoulders, forward head, increased thoracic kyphosis, and flexed trunk   LOWER EXTREMITY MMT:    Grossly 4+/5 BLE, greatest deficits in hip musculature    TRANSFERS: Assistive device utilized:  performs STS hands free to RW upon standing   Sit to stand: Modified independence - grabs on to RW in standing Stand to sit: Complete Independence  RAMP:  Level of Assistance: Modified independence per report  GAIT: Gait pattern: use of RW, crouched posture with increased BUE weightbearing on RW, decreased bilat hip ext Distance walked: 10 meters  Level of assistance: CGA   FUNCTIONAL TESTS:    6MWT - deferred to next 1-2 visits  5xSTS: 14.89 sec hands-free  BERG: initiated on this date (see flow chart), will need to complete next 1-2 visits. Unable to complete due to time limitation, pt did finish the following which were scored STS: 4 Standing unsupported: 3 Sitting with back unsupported: 4 Stand to Sit: 4 Transfers: 3 Standing with EC unsupported: 3 Standing reach forward: 3 Standing turn to look behind: 4 Turn 360 deg: 3 Standing one foot in front: 0   10MWT: 0.82 m/s some L knee pain. Pt reports he can feel his feet trying to cross over. Pt with crouched posture with walking, decreased bilat hip ext, increased UE weightbearing   PATIENT SURVEYS:  OT CVA FOTO only - not completed for PT   TODAY'S TREATMENT: DATE: 01/16/23  Neuro:  Completed BERG, and FOTO 6MWT as noted below.     PATIENT EDUCATION: Education details: exam findings, plan, testing Person educated: Patient and Spouse Education method: Explanation Education comprehension: verbalized understanding  HOME EXERCISE PROGRAM: To be initiated next 1-2 visits   GOALS: Goals reviewed  with patient? Yes  SHORT TERM GOALS: Target date: 02/20/2023  Patient will be independent in home exercise program to improve strength/mobility for better functional independence with ADLs. Baseline:  to be intiated Goal status: INITIAL    LONG TERM GOALS: Target date: 04/03/2023   Patient will complete five times sit to stand test in < 10 seconds indicating an increased LE strength and improved balance. Baseline: 14.89 sec Goal status: INITIAL  2.  Patient will increase 10 meter walk test to >1.20ms as to improve gait speed for better community ambulation and to reduce fall risk. Baseline:  0.82 m/s Goal status: INITIAL  3.  Patient will increase Berg Balance score by > 6 points to demonstrate decreased fall risk during functional activities. Baseline: 39/56 Goal status: INITIAL  4.  Patient will increase six minute walk test distance to >1000 for progression to community ambulator and improve gait ability Baseline: 795' Goal status: INITIAL  5.  Patient will demonstrate improved function as evidenced by a score of 62 on FOTO measure for full participation in activities at home and in the community. Baseline: 59 Goal status: INITIAL    ASSESSMENT:  CLINICAL IMPRESSION:  Pt completed goal assessment during today's session due to time constraints at initial evaluation.  Pt noted to have some difficulty with the tasks mostly related to his knees bothering him.  Pt ambulates quickly and sometimes has more sporadic movements when utilizing the walker due to the L UE making unexpected movements.  Pt to continue with balance and strengthening exercises in order to address current deficits.   Pt will continue to benefit from skilled therapy to address remaining deficits in order to improve overall QoL and return to PLOF.      OBJECTIVE IMPAIRMENTS: Abnormal gait, decreased activity tolerance, decreased balance, decreased coordination, decreased endurance, decreased knowledge of use of  DME, decreased mobility, difficulty walking, decreased strength, impaired UE functional use, improper body mechanics, postural dysfunction, and pain.   ACTIVITY LIMITATIONS: carrying, lifting, bending, standing, squatting, stairs, transfers, and locomotion level  PARTICIPATION LIMITATIONS: meal prep, cleaning, laundry, driving, shopping, community activity, and yard work  PERSONAL FACTORS: Age, Past/current experiences, Time since onset of injury/illness/exacerbation, and 3+ comorbidities: PMH significant for MDS, chronic a-fib, HTN, DMII, Presence of Watchman Left atrial appendage closure device, anemia, arthritis, hx of hiatal hernia, hx craniotomy 02/10/22, L atrial appendage occlusion 07/26/22, TEE without cardioversion 07/26/2022  are also affecting patient's functional outcome.   REHAB POTENTIAL: Good  CLINICAL DECISION MAKING: Evolving/moderate complexity  EVALUATION COMPLEXITY: Moderate  PLAN:  PT FREQUENCY: 2x/week  PT DURATION: 12 weeks  PLANNED INTERVENTIONS: Therapeutic exercises, Therapeutic activity, Neuromuscular re-education, Balance training, Gait training, Patient/Family education, Self Care, Joint mobilization, Stair training, Vestibular training, Canalith repositioning, Visual/preceptual remediation/compensation, Orthotic/Fit training, DME instructions, Electrical stimulation, Wheelchair mobility training, Spinal mobilization, Cryotherapy, Moist heat, Splintting, Taping, Ultrasound, Manual therapy, and Re-evaluation  PLAN FOR NEXT SESSION:   Continue with strengthening exercises and balance related tasks   JGwenlyn Saran PT, DPT Physical Therapist - [facility] 01/16/23, 6:41 PM

## 2023-01-19 NOTE — Therapy (Signed)
OUTPATIENT OCCUPATIONAL THERAPY NEURO TREATMENT  Patient Name: Vernon [name] MRN: [id number] DOB:04-29-1946, 77 y.o., male Today's Date: 01/19/2023  REFERRING PROVIDER:  Harrel Lemon, MD  END OF SESSION:  OT End of Session - 01/19/23 1430     Visit Number 4    Number of Visits 24    Date for OT Re-Evaluation 04/01/23    Authorization Time Period Progress reporting period starting 01/07/2023    OT Start Time 1430    OT Stop Time 1515    OT Time Calculation (min) 45 min    Equipment Utilized During Treatment transport chair    Activity Tolerance Patient tolerated treatment well    Behavior During Therapy WFL for tasks assessed/performed             Past Medical History:  Diagnosis Date   Anemia    Aortic stenosis    Arthritis    Complication of anesthesia    hard time getting bp up after knee replacement   Coronary artery disease    Diabetes mellitus without complication (HCC)    GERD (gastroesophageal reflux disease)    occ tums prn   History of hiatal hernia    Hypertension    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[place])    Presence of Watchman left atrial appendage closure device 07/26/2022   79m Watchman FLX with Dr. 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;  Surgeon: EKristeen Miss MD;  [facility]  Service: Neurosurgery;  Laterality: N/A;   JOINT REPLACEMENT Right 2010   LEFT ATRIAL APPENDAGE OCCLUSION N/A 07/26/2022   Procedure: LEFT ATRIAL APPENDAGE OCCLUSION;  Surgeon: LVickie Epley MD;  [facility];  Service: Cardiovascular;  Laterality: N/A;   SHOULDER ARTHROSCOPY WITH ROTATOR CUFF REPAIR AND OPEN BICEPS TENODESIS Right 11/09/2019   Procedure: RIGHT SHOULDER ARTHROSCOPY WITH SUBSCAPULARIS REPAIR, SUBACROMIAL DECOMPRESSION,MINI OPEN ROTATOR CUFF REPAIR;  Surgeon: PLeim Fabry MD;   Location: [facility];  Service: Orthopedics;  Laterality: Right;   TEE WITHOUT CARDIOVERSION N/A 07/26/2022   Procedure: TRANSESOPHAGEAL ECHOCARDIOGRAM (TEE);  Surgeon: LVickie Epley MD;  [facility];  Service: Cardiovascular;  Laterality: N/A;   Patient Active Problem List   Diagnosis Date Noted   Urinary tract infection without hematuria 12/19/2022   Painless jaundice 12/18/2022   Elevated LFTs 12/18/2022   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   Obstructive jaundice 12/17/2022   Atrial fibrillation ([place] 07/26/2022   Presence of Watchman left atrial appendage closure device 07/26/2022   Proteinuria, unspecified 05/23/2022   Simple renal cyst 05/23/2022   Long term current use of anticoagulant 04/04/2022   Rotator cuff syndrome 04/04/2022   Exposure to potentially hazardous substance 04/04/2022   Gout 04/04/2022   Osteoarthritis 04/04/2022   Osteoporosis 04/04/2022   Other specified diseases of hair and hair follicles 00000000  Pain in joint, lower leg 04/04/2022   Problem related to unspecified psychosocial circumstances 04/04/2022   General medical examination for administrative purposes 04/04/2022   Stage 3b chronic kidney disease ([place]    Dysphagia, post-stroke    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 hemorrhage of brain ([place] 02/26/2022   Cough [id number]  Acute metabolic encephalopathy [id number]  Obstructive hydrocephalus ([place] 02/19/2022   Dyslipidemia 02/19/2022   Dysphagia 02/19/2022   AKI (acute kidney injury) ([place]    Anemia    Pleural effusion on right  Respiratory failure requiring intubation (HCC)    Cerebral edema (HCC)    Chronic atrial fibrillation (HCC)    Controlled type 2 diabetes mellitus with hyperglycemia, without long-term current use of insulin (HCC)    ICH (intracerebral hemorrhage) ([place]) 02/10/2022   Intracranial hemorrhage (HCC)    Anticoagulated    Moderate tricuspid regurgitation 07/26/2021   Moderate aortic valve stenosis 03/08/2020    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[place]) 02/18/2020   Macrocytic anemia 01/13/2020   Spleen enlargement 01/13/2020   Bilateral carotid artery stenosis 07/14/2018   Atrial fibrillation, chronic ([place]) 07/14/2018   Type 2 diabetes with nephropathy ([place]) 02/14/2016   Essential hypertension 08/16/2014   Hyperlipemia, mixed 08/16/2014   Renal insufficiency 08/16/2014    ONSET DATE: 02/10/2022  REFERRING DIAG:  CVA  THERAPY DIAG:  Coordination Muscle weakness (generalized)  Other lack of coordination  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 Pt accompanied by: significant other  PERTINENT HISTORY: Pt. is a 65 y. 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 Pt. received outpt. OT services following the CVA for Left sided weakness, and incoordination. Pt. was recently hospitalized from 1/22-1/25/24 for Obstructive Jaundice. PMHx includes: MDS, Chronic A-Fib, Essential HTN, Type II DM, Dyslipidemia, Presence of Watchman Left atrial appendage closure device, GERD  PRECAUTIONS: None  WEIGHT BEARING RESTRICTIONS: No  PAIN:  Are you having pain? No  FALLS: Has patient fallen in last 6 months? No  LIVING ENVIRONMENT: Lives with: lives with their spouse Lives in: House/apartment one storey, can fully live on the 1st floor. Stairs: Ramped entrance  Has following equipment at home: 4 wheeled walker, w/c-manual, grab bars, shower chair, HH shower head, toilet riser with arms.  PLOF: Independent  PATIENT GOALS: To improve arm and hand function   OBJECTIVE:   HAND DOMINANCE: Right  ADLs: Transfers/ambulation related to ADLs: Eating: Difficulty with motor control control Grooming:  Independent UB Dressing:  Diffiulty  buttoning when no looking directly at the buttons.  LB Dressing: Independent donning pants, socks, and slide on shoes. Toileting: Independent Bathing: Independent, wife assists with towel. Tub Shower transfers: Modified Independence  in the shower, CGA transfers out of shoer.    IADLs: Shopping:  Pt.'s wife performs, occasionally uses grocery store scooters Light housekeeping: Wife performs housekeeping tasks. Meal Prep: Prepares light snacks, and light meal prep Community mobility: Relies on family and friends Medication management: Independent Financial management: Does  with wife; No changes in how they are doing them Handwriting: 100% legible for name: Wife has noticed some changes with writing  Hobbies: Plays the guitar, workshop, music shop.  MOBILITY STATUS: Needs Assist: uses a rollator  POSTURE COMMENTS:  No Significant postural limitations Sitting balance: WFL  ACTIVITY TOLERANCE: Activity tolerance:  Limited  FUNCTIONAL OUTCOME MEASURES:  FOTO: 59; TR score: 60   UPPER EXTREMITY ROM:    Active ROM Right Eval  Left Eval [facility]  Shoulder flexion 106   Shoulder abduction 85   Shoulder adduction    Shoulder extension    Shoulder internal rotation    Shoulder external rotation    Elbow flexion WFL   Elbow extension WFL   Wrist flexion    Wrist extension WFL   Wrist ulnar deviation    Wrist radial deviation    Wrist pronation    Wrist supination WFL   (  Blank rows = not tested)  Digit flexion to the [facility]:  2nd: 0cm, 3rd: 1.5cm, 4th: 2.5cm, 5th: 0cm   UPPER EXTREMITY MMT:     MMT Right eval Left eval  Shoulder flexion 3- 4  Shoulder abduction 3- 4  Shoulder adduction    Shoulder extension    Shoulder internal rotation    Shoulder external rotation    Middle trapezius    Lower trapezius    Elbow flexion 4 4  Elbow extension 4 4  Wrist flexion    Wrist extension 4 4  Wrist ulnar deviation    Wrist radial deviation    Wrist pronation    Wrist  supination    (Blank rows = not tested)  HAND FUNCTION: Grip strength: N/A secondary to arthritis, and 3rd digit trigger finger    Lateral Pinch: R: 23#, L: 20#  3 Pt. Pinch: R: 22#, L: 21#  COORDINATION: 9 Hole Peg test: Right: 25 sec; Left: 2 min. & 34  sec  SENSATION: WFL Light touch: WFL Stereognosis: WFL  EDEMA:   N/A  MUSCLE TONE:  Intact  COGNITION: Overall cognitive status: Within functional limits for tasks assessed  VISION: Subjective report: Pt. reports double vision initially at the onset of the CVA with difficulty scanning to left when reading. Pt. Reports this has improved, and the double vision has resolved.   PERCEPTION: WFL  PRAXIS: Impaired motor control, and coordination   TODAY'S TREATMENT:                                                                                                                              DATE: 01/09/2023  Neuromuscular re-education: Facilitated GMC/FMC as pt worked on goal directed reaching for targets sliding glass stones around upright jumbo pegs on table top without knocking over pegs, then placing and removing stones from tops of pegs without knocking pegs over.  Pt practiced picking up stones from table top, storing stones in hand, and discarding stones 1 at a time without dropping the others from palm.  Pt practiced grabbing a stone from OT hand to facilitate above shoulder level reaching forward and laterally with the LUE.    PATIENT EDUCATION: Education details:  Proximal stabilization  Person educated: Patient and Spouse Education method: Explanation, Demonstration, and Tactile cues Education comprehension: verbalized understanding, returned demonstration, verbal cues required, tactile cues required, and needs further education  Home Exercise Program:   Continue to assess and provide as indicated  GOALS: Goals reviewed with patient?  SHORT TERM GOALS: Target date: 02/18/2023    Pt. will demonstrate independence  with HEP for LUE functioning Baseline: Eval: No current HEP Goal status: INITIAL  LONG TERM GOALS: Target date:  04/01/2023    Pt. will improve FOTO score by 1 point for patient perceived improvement with assessment specific ADLs, and IADLs.  Baseline: Eval: FOTO score: 59; TR score: 60 Goal status: INITIAL  2.  Pt. will independently modulate the accuracy  of holding items without overgripping when reaching for items 50% of the time Baseline: Eval: Pt. is consistently overpowering, and over gripping items when grasping them with the left hand Goal status: INITIAL  3.  Pt. will improve left hand [facility] skills by 10 sec. to improve left hand grasp on objects.  Baseline: Eval: R: 25 sec. L: 2 min. & 34 sec.  Goal status: INITIAL  4.  Pt. Will utilize compensatory strategies for left hand function motor coordination 75% during ADLs, and IADL tasks. Baseline: Eval: Minimal use of compensatory strategies during ADLs, and IADLs. Goal status: INITIAL  5.  Pt. Will use compensatory strategies for left hand function 75% of the time when utilizing multiple body systems, dual tasking, and using the hand in a variety of different contexts. Baseline: Eval: Pt. Consistently has difficulty using his left hand to complete tasks when he is not looking directly at his hand, and has more difficulty using his left hand when standing, or attempting to walk with the walker.  Goal status: INITIAL  ASSESSMENT:   CLINICAL IMPRESSION: Pt continues to practice WB through the L forearm to increase distal stability of L hand while reaching toward a target on table top.  When working to place a glass stone ontop of a jumbo peg, OT held peg in place to prevent a falling peg d/t LUE moderate-severe ataxia.  Pt gives himself vc to take his time and talks to his arm to "steady" himself when reaching, grasping, and releasing an object in the L hand.  Pt continues to benefit form OT services to improve Left hand function, and  [facility] skills in order to be able to use his hand when challenged in multiple contexts, and with dual tasking. Pt. will benefit from education about compensatory strategies for left hand function during ADLs, and IADLs.    PERFORMANCE DEFICITS: in functional skills including ADLs, IADLs, coordination, dexterity, ROM, strength, Fine motor control, and Gross motor control, cognitive skills including attention, memory, problem solving, and safety awareness, and psychosocial skills including coping strategies, environmental adaptation, habits, and routines and behaviors.   IMPAIRMENTS: are limiting patient from ADLs, IADLs, play, leisure, and social participation.   CO-MORBIDITIES: may have co-morbidities  that affects occupational performance. Patient will benefit from skilled OT to address above impairments and improve overall function.  MODIFICATION OR ASSISTANCE TO COMPLETE EVALUATION: Min-Moderate modification of tasks or assist with assess necessary to complete an evaluation.  OT OCCUPATIONAL PROFILE AND HISTORY: Detailed assessment: Review of records and additional review of physical, cognitive, psychosocial history related to current functional performance.  CLINICAL DECISION MAKING: Moderate - several treatment options, min-mod task modification necessary  REHAB POTENTIAL: Good  EVALUATION COMPLEXITY: Moderate    PLAN:  OT FREQUENCY: 2x/week  OT DURATION: 12 weeks  PLANNED INTERVENTIONS: self care/ADL training, therapeutic exercise, therapeutic activity, patient/family education, cognitive remediation/compensation, and DME and/or AE instructions, neuromuscular re-education  RECOMMENDED OTHER SERVICES: OT services.  CONSULTED AND AGREED WITH PLAN OF CARE: Patient and family member/caregiver-wife  PLAN FOR NEXT SESSION: Initiate treatment  Leta Speller, MS, OTR/L   Darleene Cleaver, OT 01/19/2023, 2:32 PM

## 2023-01-21 ENCOUNTER — Ambulatory Visit: Payer: Medicare Other

## 2023-01-21 DIAGNOSIS — M6281 Muscle weakness (generalized): Secondary | ICD-10-CM

## 2023-01-21 DIAGNOSIS — R278 Other lack of coordination: Secondary | ICD-10-CM

## 2023-01-21 DIAGNOSIS — R2681 Unsteadiness on feet: Secondary | ICD-10-CM

## 2023-01-21 DIAGNOSIS — R2689 Other abnormalities of gait and mobility: Secondary | ICD-10-CM

## 2023-01-21 DIAGNOSIS — I69254 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: Secondary | ICD-10-CM

## 2023-01-21 DIAGNOSIS — R262 Difficulty in walking, not elsewhere classified: Secondary | ICD-10-CM

## 2023-01-21 NOTE — Therapy (Signed)
OUTPATIENT OCCUPATIONAL THERAPY NEURO TREATMENT  Patient Name: Vernon [name] MRN: [id number] DOB:03/05/1946, 77 y.o., male Today's Date: 01/21/2023  REFERRING PROVIDER:  Harrel Lemon, MD  END OF SESSION:  OT End of Session - 01/21/23 1719     Visit Number 5    Number of Visits 24    Date for OT Re-Evaluation 04/01/23    Authorization Time Period Progress reporting period starting 01/07/2023    OT Start Time 1430    OT Stop Time 1515    OT Time Calculation (min) 45 min    Equipment Utilized During Treatment transport chair    Activity Tolerance Patient tolerated treatment well    Behavior During Therapy WFL for tasks assessed/performed             Past Medical History:  Diagnosis Date   Anemia    Aortic stenosis    Arthritis    Complication of anesthesia    hard time getting bp up after knee replacement   Coronary artery disease    Diabetes mellitus without complication (HCC)    GERD (gastroesophageal reflux disease)    occ tums prn   History of hiatal hernia    Hypertension    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[place])    Presence of Watchman left atrial appendage closure device 07/26/2022   21m Watchman FLX with Dr. 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;  Surgeon: EKristeen Miss MD;  [place] [facility]  Service: Neurosurgery;  Laterality: N/A;   JOINT REPLACEMENT Right 2010   LEFT ATRIAL APPENDAGE OCCLUSION N/A 07/26/2022   Procedure: LEFT ATRIAL APPENDAGE OCCLUSION;  Surgeon: LVickie Epley MD;  [facility];  Service: Cardiovascular;  Laterality: N/A;   SHOULDER ARTHROSCOPY WITH ROTATOR CUFF REPAIR AND OPEN BICEPS TENODESIS Right 11/09/2019   Procedure: RIGHT SHOULDER ARTHROSCOPY WITH SUBSCAPULARIS REPAIR, SUBACROMIAL DECOMPRESSION,MINI OPEN ROTATOR CUFF REPAIR;  Surgeon: PLeim Fabry MD;   Location: [facility];  Service: Orthopedics;  Laterality: Right;   TEE WITHOUT CARDIOVERSION N/A 07/26/2022   Procedure: TRANSESOPHAGEAL ECHOCARDIOGRAM (TEE);  Surgeon: LVickie Epley MD;  [facility];  Service: Cardiovascular;  Laterality: N/A;   Patient Active Problem List   Diagnosis Date Noted   Urinary tract infection without hematuria 12/19/2022   Painless jaundice 12/18/2022   Elevated LFTs 12/18/2022   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   Obstructive jaundice 12/17/2022   Atrial fibrillation ([facility] 07/26/2022   Presence of Watchman left atrial appendage closure device 07/26/2022   Proteinuria, unspecified 05/23/2022   Simple renal cyst 05/23/2022   Long term current use of anticoagulant 04/04/2022   Rotator cuff syndrome 04/04/2022   Exposure to potentially hazardous substance 04/04/2022   Gout 04/04/2022   Osteoarthritis 04/04/2022   Osteoporosis 04/04/2022   Other specified diseases of hair and hair follicles 00000000  Pain in joint, lower leg 04/04/2022   Problem related to unspecified psychosocial circumstances 04/04/2022   General medical examination for administrative purposes 04/04/2022   Stage 3b chronic kidney disease ([facility]    Dysphagia, post-stroke    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 hemorrhage of brain ([facility] 02/26/2022   Cough [id number]  Acute metabolic encephalopathy [id number]  Obstructive hydrocephalus ([facility] 02/19/2022   Dyslipidemia 02/19/2022   Dysphagia 02/19/2022   AKI (acute kidney injury) ([facility]    Anemia    Pleural effusion on right  Respiratory failure requiring intubation (HCC)    Cerebral edema (HCC)    Chronic atrial fibrillation (HCC)    Controlled type 2 diabetes mellitus with hyperglycemia, without long-term current use of insulin (HCC)    ICH (intracerebral hemorrhage) ([place]) 02/10/2022   Intracranial hemorrhage (HCC)    Anticoagulated    Moderate tricuspid regurgitation 07/26/2021   Moderate aortic valve stenosis 03/08/2020    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[place]) 02/18/2020   Macrocytic anemia 01/13/2020   Spleen enlargement 01/13/2020   Bilateral carotid artery stenosis 07/14/2018   Atrial fibrillation, chronic ([place]) 07/14/2018   Type 2 diabetes with nephropathy ([place]) 02/14/2016   Essential hypertension 08/16/2014   Hyperlipemia, mixed 08/16/2014   Renal insufficiency 08/16/2014    ONSET DATE: 02/10/2022  REFERRING DIAG:  CVA  THERAPY DIAG:  Coordination Muscle weakness (generalized)  Other lack of coordination  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   Pt accompanied by: significant other  PERTINENT HISTORY: Pt. is a 66 y. 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 Pt. received outpt. OT services following the CVA for Left sided weakness, and incoordination. Pt. was recently hospitalized from 1/22-1/25/24 for Obstructive Jaundice. PMHx includes: MDS, Chronic A-Fib, Essential HTN, Type II DM, Dyslipidemia, Presence of Watchman Left atrial appendage closure device, GERD  PRECAUTIONS: None  WEIGHT BEARING RESTRICTIONS: No  PAIN:  Are you having pain? No  FALLS: Has patient fallen in last 6 months? No  LIVING ENVIRONMENT: Lives with: lives with their spouse Lives in: House/apartment one storey, can fully live on the 1st floor. Stairs: Ramped entrance  Has following equipment at home: 4 wheeled walker, w/c-manual, grab bars, shower chair, HH shower head, toilet riser with arms.  PLOF: Independent  PATIENT GOALS: To improve arm and hand function   OBJECTIVE:   HAND DOMINANCE: Right  ADLs: Transfers/ambulation related to ADLs: Eating: Difficulty with motor  control control Grooming:  Independent UB Dressing:  Diffiulty buttoning when no looking directly at the buttons.  LB Dressing: Independent donning pants, socks, and slide on shoes. Toileting: Independent Bathing: Independent, wife assists with towel. Tub Shower transfers: Modified Independence  in the shower, CGA transfers out of shoer.   IADLs: Shopping:  Pt.'s wife performs, occasionally uses grocery store scooters Light housekeeping: Wife performs housekeeping tasks. Meal Prep: Prepares light snacks, and light meal prep Community mobility: Relies on family and friends Medication management: Independent Financial management: Does  with wife; No changes in how they are doing them Handwriting: 100% legible for name: Wife has noticed some changes with writing  Hobbies: Plays the guitar, workshop, music shop.  MOBILITY STATUS: Needs Assist: uses a rollator  POSTURE COMMENTS:  No Significant postural limitations Sitting balance: WFL  ACTIVITY TOLERANCE: Activity tolerance:  Limited  FUNCTIONAL OUTCOME MEASURES:  FOTO: 59; TR score: 60  UPPER EXTREMITY ROM:    Active ROM Right Eval  Left Eval [facility]  Shoulder flexion 106   Shoulder abduction 85   Shoulder adduction    Shoulder extension    Shoulder internal rotation    Shoulder external rotation    Elbow flexion WFL   Elbow extension WFL   Wrist flexion    Wrist extension WFL   Wrist ulnar deviation  Wrist radial deviation    Wrist pronation    Wrist supination WFL   (Blank rows = not tested)  Digit flexion to the [facility]:  2nd: 0cm, 3rd: 1.5cm, 4th: 2.5cm, 5th: 0cm   UPPER EXTREMITY MMT:     MMT Right eval Left eval  Shoulder flexion 3- 4  Shoulder abduction 3- 4  Shoulder adduction    Shoulder extension    Shoulder internal rotation    Shoulder external rotation    Middle trapezius    Lower trapezius    Elbow flexion 4 4  Elbow extension 4 4  Wrist flexion    Wrist extension 4 4  Wrist ulnar  deviation    Wrist radial deviation    Wrist pronation    Wrist supination    (Blank rows = not tested)  HAND FUNCTION: Grip strength: N/A secondary to arthritis, and 3rd digit trigger finger    Lateral Pinch: R: 23#, L: 20#  3 Pt. Pinch: R: 22#, L: 21#  COORDINATION: 9 Hole Peg test: Right: 25 sec; Left: 2 min. & 34  sec  SENSATION: WFL Light touch: WFL Stereognosis: WFL  EDEMA:   N/A  MUSCLE TONE:  Intact  COGNITION: Overall cognitive status: Within functional limits for tasks assessed  VISION: Subjective report: Pt. reports double vision initially at the onset of the CVA with difficulty scanning to left when reading. Pt. Reports this has improved, and the double vision has resolved.   PERCEPTION: WFL  PRAXIS: Impaired motor control, and coordination   TODAY'S TREATMENT:                                                                                                                              DATE: 01/09/2023  Neuromuscular re-education: Facilitated GMC/FMC as pt worked on goal directed reaching for targets working to pick up [employer] and placing them into pegboard which was positioned on an incline at table top level.  Pt practiced reaching to move pegs from each perimeter of the square pegboard, and practiced rotating pegs from dot side up to down.  Pt practiced picking up stones from table top, storing stones in hand, and discarding stones 1 at a time without dropping the others from palm.  Continued practice with reaching toward a target with reach to discard stones into a cup reaching forward, ipsilaterally, and contralaterally with the LUE.  Pt practiced L grip modulation grasping various size paper cups of different weight (graded by placing glass stones in cup).  Pt practiced picking up cups and placing them at a target on table top, and pouring stones from 1 cup to another without spilling.    PATIENT EDUCATION: Education details:  LUE Education officer, environmental Person educated: pt Education method: Explanation, Demonstration, and Tactile cues Education comprehension: verbalized understanding, returned demonstration, verbal cues required, tactile cues required, and needs further education  Home Exercise Program:  Continue to assess and provide as indicated  GOALS:  Goals reviewed with patient?  SHORT TERM GOALS: Target date: 02/18/2023    Pt. will demonstrate independence with HEP for LUE functioning Baseline: Eval: No current HEP Goal status: INITIAL  LONG TERM GOALS: Target date:  04/01/2023    Pt. will improve FOTO score by 1 point for patient perceived improvement with assessment specific ADLs, and IADLs.  Baseline: Eval: FOTO score: 59; TR score: 60 Goal status: INITIAL  2.  Pt. will independently modulate the accuracy of holding items without overgripping when reaching for items 50% of the time Baseline: Eval: Pt. is consistently overpowering, and over gripping items when grasping them with the left hand Goal status: INITIAL  3.  Pt. will improve left hand [facility] skills by 10 sec. to improve left hand grasp on objects.  Baseline: Eval: R: 25 sec. L: 2 min. & 34 sec.  Goal status: INITIAL  4.  Pt. Will utilize compensatory strategies for left hand function motor coordination 75% during ADLs, and IADL tasks. Baseline: Eval: Minimal use of compensatory strategies during ADLs, and IADLs. Goal status: INITIAL  5.  Pt. Will use compensatory strategies for left hand function 75% of the time when utilizing multiple body systems, dual tasking, and using the hand in a variety of different contexts. Baseline: Eval: Pt. Consistently has difficulty using his left hand to complete tasks when he is not looking directly at his hand, and has more difficulty using his left hand when standing, or attempting to walk with the walker.  Goal status: INITIAL  ASSESSMENT:   CLINICAL IMPRESSION: Pt continues to practice WB through the L forearm to  increase distal stability of L hand while reaching toward a target on table top.  Pt also practiced reaching without WB when placing Judy Board pegs, which resulted in several dropped pegs.  When trying to reposition a peg within fingertips, pt tends to compensate with pushing peg against body as he often drops a peg from his fingertips without this strategy.  Pt demonstrated improved ability to pick up glass stones from table top flat side down today.  Pt continues to struggle to grasp a paper cup without squeezing too hard or knocking it over.  Pt continues to benefit form OT services to improve Left hand function, improve LUE ataxia, use of graded pressure with grasping, and [facility] skills in order to be able to use his hand when challenged in multiple contexts, and with dual tasking. Pt will benefit from education about compensatory strategies for left hand function during ADLs, and IADLs.    PERFORMANCE DEFICITS: in functional skills including ADLs, IADLs, coordination, dexterity, ROM, strength, Fine motor control, and Gross motor control, cognitive skills including attention, memory, problem solving, and safety awareness, and psychosocial skills including coping strategies, environmental adaptation, habits, and routines and behaviors.   IMPAIRMENTS: are limiting patient from ADLs, IADLs, play, leisure, and social participation.   CO-MORBIDITIES: may have co-morbidities  that affects occupational performance. Patient will benefit from skilled OT to address above impairments and improve overall function.  MODIFICATION OR ASSISTANCE TO COMPLETE EVALUATION: Min-Moderate modification of tasks or assist with assess necessary to complete an evaluation.  OT OCCUPATIONAL PROFILE AND HISTORY: Detailed assessment: Review of records and additional review of physical, cognitive, psychosocial history related to current functional performance.  CLINICAL DECISION MAKING: Moderate - several treatment options, min-mod  task modification necessary  REHAB POTENTIAL: Good  EVALUATION COMPLEXITY: Moderate    PLAN:  OT FREQUENCY: 2x/week  OT DURATION: 12 weeks  PLANNED INTERVENTIONS: self  care/ADL training, therapeutic exercise, therapeutic activity, patient/family education, cognitive remediation/compensation, and DME and/or AE instructions, neuromuscular re-education  RECOMMENDED OTHER SERVICES: OT services.  CONSULTED AND AGREED WITH PLAN OF CARE: Patient and family member/caregiver-wife  PLAN FOR NEXT SESSION: Initiate treatment  Leta Speller, MS, OTR/L  Darleene Cleaver, OT 01/21/2023, 5:20 PM

## 2023-01-21 NOTE — Therapy (Signed)
OUTPATIENT PHYSICAL THERAPY NEURO TREATMENT   Patient Name: Vernon [name] MRN: [id number] DOB:1946-06-25, 77 y.o., male Today's Date: 01/21/2023   PCP:   Baxter Hire, MD   REFERRING PROVIDER:   Baxter Hire, MD    END OF SESSION:  PT End of Session - 01/21/23 1515     Visit Number 3    Number of Visits 25    Date for PT Re-Evaluation 04/03/23    PT Start Time [id number]    PT Stop Time 1600    PT Time Calculation (min) 45 min    Equipment Utilized During Treatment Gait belt    Activity Tolerance Patient tolerated treatment well    Behavior During Therapy WFL for tasks assessed/performed              Past Medical History:  Diagnosis Date   Anemia    Aortic stenosis    Arthritis    Complication of anesthesia    hard time getting bp up after knee replacement   Coronary artery disease    Diabetes mellitus without complication (HCC)    GERD (gastroesophageal reflux disease)    occ tums prn   History of hiatal hernia    Hypertension    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[place])    Presence of Watchman left atrial appendage closure device 07/26/2022   37m Watchman FLX with Dr. 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;  Surgeon: EKristeen Miss MD;  [facility];  Service: Neurosurgery;  Laterality: N/A;   JOINT REPLACEMENT Right 2010   LEFT ATRIAL APPENDAGE OCCLUSION N/A 07/26/2022   Procedure: LEFT ATRIAL APPENDAGE OCCLUSION;  Surgeon: LVickie Epley MD;  [facility];  Service: Cardiovascular;  Laterality: N/A;   SHOULDER ARTHROSCOPY WITH ROTATOR CUFF REPAIR AND OPEN BICEPS TENODESIS Right 11/09/2019   Procedure: RIGHT SHOULDER ARTHROSCOPY WITH SUBSCAPULARIS REPAIR, SUBACROMIAL DECOMPRESSION,MINI OPEN ROTATOR CUFF REPAIR;  Surgeon: PLeim Fabry MD;  Location: [facility];  Service:  Orthopedics;  Laterality: Right;   TEE WITHOUT CARDIOVERSION N/A 07/26/2022   Procedure: TRANSESOPHAGEAL ECHOCARDIOGRAM (TEE);  Surgeon: LVickie Epley MD;  [facility];  Service: Cardiovascular;  Laterality: N/A;   Patient Active Problem List   Diagnosis Date Noted   Urinary tract infection without hematuria 12/19/2022   Painless jaundice 12/18/2022   Elevated LFTs 12/18/2022   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   Obstructive jaundice 12/17/2022   Atrial fibrillation ([place] 07/26/2022   Presence of Watchman left atrial appendage closure device 07/26/2022   Proteinuria, unspecified 05/23/2022   Simple renal cyst 05/23/2022   Long term current use of anticoagulant 04/04/2022   Rotator cuff syndrome 04/04/2022   Exposure to potentially hazardous substance 04/04/2022   Gout 04/04/2022   Osteoarthritis 04/04/2022   Osteoporosis 04/04/2022   Other specified diseases of hair and hair follicles 00000000  Pain in joint, lower leg 04/04/2022   Problem related to unspecified psychosocial circumstances 04/04/2022   General medical examination for administrative purposes 04/04/2022   Stage 3b chronic kidney disease ([facility]    Dysphagia, post-stroke    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 hemorrhage of brain ([facility] [place] 02/26/2022   Cough [id number]  Acute metabolic encephalopathy [id number]  Obstructive hydrocephalus ([facility] 02/19/2022   Dyslipidemia 02/19/2022   Dysphagia 02/19/2022   AKI (acute kidney injury) ([facility]    Anemia    Pleural effusion  on right    Respiratory failure requiring intubation (HCC)    Cerebral edema (HCC)    Chronic atrial fibrillation (HCC)    Controlled type 2 diabetes mellitus with hyperglycemia, without long-term current use of insulin (HCC)    ICH (intracerebral hemorrhage) ([place]) 02/10/2022   Intracranial hemorrhage (HCC)    Anticoagulated    Moderate tricuspid regurgitation 07/26/2021   Moderate aortic valve stenosis 03/08/2020   MDS (myelodysplastic syndrome)  ([place]) 02/18/2020   Macrocytic anemia 01/13/2020   Spleen enlargement 01/13/2020   Bilateral carotid artery stenosis 07/14/2018   Atrial fibrillation, chronic ([place]) 07/14/2018   Type 2 diabetes with nephropathy ([place]) 02/14/2016   Essential hypertension 08/16/2014   Hyperlipemia, mixed 08/16/2014   Renal insufficiency 08/16/2014    ONSET DATE: 02/10/2022  REFERRING DIAG:  I63.9 (ICD-10-CM) - Cerebral infarction, unspecified  I63.9 (ICD-10-CM) - CVA (cerebral vascular accident) ([place])    THERAPY DIAG:  Muscle weakness (generalized)  Other lack of coordination  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  Pt states he drove on Saturday for the first time in over a year, and felt comfortable getting back behind the wheel.  Pt states he did not get any sleep on Saturday night though because he was so sore in the neck and shoulder from driving.   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 Pt hospitalized 1/22-1/25/24 for obstructive Jaundice. Pt reports it was found he had a UTI with recent hospital stay. He wants to improve his endurance and balance. PMH significant for MDS, chronic a-fib, HTN, DMII, Presence of Watchman Left atrial appendage closure device, anemia, arthritis, hx of hiatal hernia, hx craniotomy 02/10/22, L atrial  appendage occlusion 07/26/22, TEE without cardioversion 07/26/2022    PAIN:  Are you having pain? 6/10 in the L knee when walking.  PRECAUTIONS: Fall  WEIGHT BEARING RESTRICTIONS: No  FALLS: Has patient fallen in last 6 months? No  LIVING ENVIRONMENT: Lives with: lives with their family and lives  with their spouse Lives in:  Stairs:  Has following equipment at home: [place] - 4 wheeled  PLOF: Independent  PATIENT GOALS: improve coordination, endurance and balance  OBJECTIVE:   DIAGNOSTIC FINDINGS:   02/20/22 CT HEAD: "IMPRESSION: No evidence of interval acute intracranial abnormality.   Continued evolution of postoperative changes from prior left suboccipital craniectomy and left posterior fossa hematoma evacuation. Residual hemorrhage within the left cerebellar hemisphere, similar to slightly decreased from the prior head CT of 02/18/2022. Unchanged left cerebellar edema. Stable mass effect with partial effacement of the fourth ventricle. No evidence of hydrocephalus.   Persistent edema and trace persistent hemorrhage within the right occipital lobe along the tract from prior attempted ventricular catheter placement.   Stable chronic small vessel ischemic changes within the cerebral white matter.   Paranasal sinus disease, as described."  MR BRAIN 02/12/22: " IMPRESSION: 1. Left suboccipital craniectomy for hematoma evacuation. Roughly 13 mL residual left cerebellar blood tracking across midline to the right. Cerebellar edema with partial effacement of the 4th ventricle. But basilar cistern patency is adequate, with no ventriculomegaly.   2. Sequelae of attempted EVD placement across the splenium of the corpus callosum. Trace intraventricular and subarachnoid hemorrhage.   3. Background mild to moderate nonspecific cerebral white matter signal changes.   4. 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."  Please  refer to chart for full details    COGNITION: Overall cognitive status: Within functional limits for tasks assessed   SENSATION: WFL BLE   COORDINATION: Impaired LUE WFL BLE  EDEMA:  None observed at present    POSTURE: rounded shoulders, forward head, increased thoracic kyphosis, and flexed trunk   LOWER EXTREMITY MMT:    Grossly 4+/5 BLE, greatest deficits in hip musculature    TRANSFERS: Assistive device utilized:  performs STS hands free to RW upon standing   Sit to stand: Modified independence - grabs on to RW in standing Stand to sit: Complete Independence  RAMP:  Level of Assistance: Modified independence per report  GAIT: Gait pattern: use of RW, crouched posture with increased BUE weightbearing on RW, decreased bilat hip ext Distance walked: 10 meters  Level of assistance: CGA   FUNCTIONAL TESTS:    6MWT - deferred to next 1-2 visits  5xSTS: 14.89 sec hands-free  BERG: initiated on this date (see flow chart), will need to complete next 1-2 visits. Unable to complete due to time limitation, pt did finish the following which were scored STS: 4 Standing unsupported: 3 Sitting with back unsupported: 4 Stand to Sit: 4 Transfers: 3 Standing with EC unsupported: 3 Standing reach forward: 3 Standing turn to look behind: 4 Turn 360 deg: 3 Standing one foot in front: 0   10MWT: 0.82 m/s some L knee pain. Pt reports he can feel his feet trying to cross over. Pt with crouched posture with walking, decreased bilat hip ext, increased UE weightbearing   PATIENT SURVEYS:  OT CVA FOTO only - not completed for PT   TODAY'S TREATMENT: DATE: 01/21/23   TherEx:  Seated hip adduction into physioball, 3 sec holds, 2x15 Seated hip abduction into BTB, 2x15 Seated LAQ with hip adduction into rainbow physioball, 2x15 each LE   Neuro:  Ambulation around the gym, down the EVS hallway, past cafeteria, and back to the gym, x1 total lap.  Korebalance Tux Racer,  Practice Mode - Extras, Bronze Set, stability level 2, 55 Herring Caught - 8 min 42 sec  PATIENT EDUCATION: Education details: exam findings, plan, testing Person educated: Patient and Spouse Education method: Explanation Education comprehension: verbalized understanding  HOME EXERCISE PROGRAM: To be initiated next 1-2 visits   GOALS: Goals reviewed with patient? Yes  SHORT TERM GOALS: Target date: 02/20/2023  Patient will be independent in home exercise program to improve strength/mobility for better functional independence with ADLs. Baseline: to be intiated Goal status: INITIAL    LONG TERM GOALS: Target date: 04/03/2023   Patient will complete five times sit to stand test in < 10 seconds indicating an increased LE strength and improved balance. Baseline: 14.89 sec Goal status: INITIAL  2.  Patient will increase 10 meter walk test to >1.97ms as to improve gait speed for better community ambulation and to reduce fall risk. Baseline:  0.82 m/s Goal status: INITIAL  3.  Patient will increase Berg Balance score by > 6 points to demonstrate decreased fall risk during functional activities. Baseline: 39/56 Goal status: INITIAL  4.  Patient will increase six minute walk test distance to >1000 for progression to community ambulator and improve gait ability Baseline: 795' Goal status: INITIAL  5.  Patient will demonstrate improved function as evidenced by a score of 62 on FOTO measure for full participation in activities at home and in the community. Baseline: 59 Goal status: INITIAL    ASSESSMENT:  CLINICAL IMPRESSION:  Pt performed well today, however limited due to the instability in the L knee and needing a knee surgery.  Pt still able to put forth great effort throughout the session however.  Pt did require some seated rest breaks due to the pain in the knee with the upright balance activities, but otherwise continue to perform the tasks necessary.  Therapist  discussed current plan with pt and spouse.   Pt will continue to benefit from skilled therapy to address remaining deficits in order to improve overall QoL and return to PLOF.        OBJECTIVE IMPAIRMENTS: Abnormal gait, decreased activity tolerance, decreased balance, decreased coordination, decreased endurance, decreased knowledge of use of DME, decreased mobility, difficulty walking, decreased strength, impaired UE functional use, improper body mechanics, postural dysfunction, and pain.   ACTIVITY LIMITATIONS: carrying, lifting, bending, standing, squatting, stairs, transfers, and locomotion level  PARTICIPATION LIMITATIONS: meal prep, cleaning, laundry, driving, shopping, community activity, and yard work  PERSONAL FACTORS: Age, Past/current experiences, Time since onset of injury/illness/exacerbation, and 3+ comorbidities: PMH significant for MDS, chronic a-fib, HTN, DMII, Presence of Watchman Left atrial appendage closure device, anemia, arthritis, hx of hiatal hernia, hx craniotomy 02/10/22, L atrial appendage occlusion 07/26/22, TEE without cardioversion 07/26/2022  are also affecting patient's functional outcome.   REHAB POTENTIAL: Good  CLINICAL DECISION MAKING: Evolving/moderate complexity  EVALUATION COMPLEXITY: Moderate  PLAN:  PT FREQUENCY: 2x/week  PT DURATION: 12 weeks  PLANNED INTERVENTIONS: Therapeutic exercises, Therapeutic activity, Neuromuscular re-education, Balance training, Gait training, Patient/Family education, Self Care, Joint mobilization, Stair training, Vestibular training, Canalith repositioning, Visual/preceptual remediation/compensation, Orthotic/Fit training, DME instructions, Electrical stimulation, Wheelchair mobility training, Spinal mobilization, Cryotherapy, Moist heat, Splintting, Taping, Ultrasound, Manual therapy, and Re-evaluation  PLAN FOR NEXT SESSION:   Continue with strengthening exercises and balance related tasks   JGwenlyn Saran PT,  DPT Physical Therapist - [facility] 01/21/23, 5:02 PM

## 2023-01-23 ENCOUNTER — Ambulatory Visit: Payer: Medicare Other | Admitting: Occupational Therapy

## 2023-01-23 ENCOUNTER — Inpatient Hospital Stay: Payer: Medicare Other

## 2023-01-23 ENCOUNTER — Ambulatory Visit: Payer: Medicare Other

## 2023-01-23 VITALS — BP 140/86

## 2023-01-23 DIAGNOSIS — D469 Myelodysplastic syndrome, unspecified: Secondary | ICD-10-CM

## 2023-01-23 DIAGNOSIS — R278 Other lack of coordination: Secondary | ICD-10-CM

## 2023-01-23 DIAGNOSIS — M6281 Muscle weakness (generalized): Secondary | ICD-10-CM | POA: Diagnosis not present

## 2023-01-23 DIAGNOSIS — D4621 Refractory anemia with excess of blasts 1: Secondary | ICD-10-CM | POA: Diagnosis not present

## 2023-01-23 LAB — HEMOGLOBIN AND HEMATOCRIT, BLOOD
HCT: 26.9 % — ABNORMAL LOW (ref 39.0–52.0)
Hemoglobin: 8.6 g/dL — ABNORMAL LOW (ref 13.0–17.0)

## 2023-01-23 MED ORDER — DARBEPOETIN ALFA 100 MCG/0.5ML IJ SOSY
100.0000 ug | PREFILLED_SYRINGE | Freq: Once | INTRAMUSCULAR | Status: AC
  Administered 2023-01-23: 100 ug via SUBCUTANEOUS
  Filled 2023-01-23: qty 0.5

## 2023-01-24 NOTE — Therapy (Signed)
OUTPATIENT OCCUPATIONAL THERAPY NEURO TREATMENT  Patient Name: Vernon [name] MRN: [id number] DOB:[birth year], 77 y.o., male Today's Date: 01/24/2023  REFERRING PROVIDER:  Harrel Lemon, MD  END OF SESSION:  OT End of Session - 01/24/23 0918     Visit Number 6    Number of Visits 24    Date for OT Re-Evaluation 04/01/23    Authorization Time Period Progress reporting period starting 01/07/2023    OT Start Time 1602    OT Stop Time 1645    OT Time Calculation (min) 43 min    Equipment Utilized During Treatment transport chair    Activity Tolerance Patient tolerated treatment well    Behavior During Therapy WFL for tasks assessed/performed             Past Medical History:  Diagnosis Date   Anemia    Aortic stenosis    Arthritis    Complication of anesthesia    hard time getting bp up after knee replacement   Coronary artery disease    Diabetes mellitus without complication (HCC)    GERD (gastroesophageal reflux disease)    occ tums prn   History of hiatal hernia    Hypertension    MDS (myelodysplastic syndrome) ([place])    Presence of Watchman left atrial appendage closure device 07/26/2022   63m Watchman FLX with Dr. 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;  Surgeon: EKristeen Miss MD;  [facility]: [facility]  Service: Neurosurgery;  Laterality: N/A;   JOINT REPLACEMENT Right 2010   LEFT ATRIAL APPENDAGE OCCLUSION N/A 07/26/2022   Procedure: LEFT ATRIAL APPENDAGE OCCLUSION;  Surgeon: LVickie Epley MD;  [facility];  Service: Cardiovascular;  Laterality: N/A;   SHOULDER ARTHROSCOPY WITH ROTATOR CUFF REPAIR AND OPEN BICEPS TENODESIS Right 11/09/2019   Procedure: RIGHT SHOULDER ARTHROSCOPY WITH SUBSCAPULARIS REPAIR, SUBACROMIAL DECOMPRESSION,MINI OPEN ROTATOR CUFF REPAIR;  Surgeon: PLeim Fabry MD;   Location: [facility];  Service: Orthopedics;  Laterality: Right;   TEE WITHOUT CARDIOVERSION N/A 07/26/2022   Procedure: TRANSESOPHAGEAL ECHOCARDIOGRAM (TEE);  Surgeon: LVickie Epley MD;  [facility];  Service: Cardiovascular;  Laterality: N/A;   Patient Active Problem List   Diagnosis Date Noted   Urinary tract infection without hematuria 12/19/2022   Painless jaundice 12/18/2022   Elevated LFTs 12/18/2022   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   Obstructive jaundice 12/17/2022   Atrial fibrillation ([facility] 07/26/2022   Presence of Watchman left atrial appendage closure device 07/26/2022   Proteinuria, unspecified 05/23/2022   Simple renal cyst 05/23/2022   Long term current use of anticoagulant 04/04/2022   Rotator cuff syndrome 04/04/2022   Exposure to potentially hazardous substance 04/04/2022   Gout 04/04/2022   Osteoarthritis 04/04/2022   Osteoporosis 04/04/2022   Other specified diseases of hair and hair follicles 00000000  Pain in joint, lower leg 04/04/2022   Problem related to unspecified psychosocial circumstances 04/04/2022   General medical examination for administrative purposes 04/04/2022   Stage 3b chronic kidney disease ([facility]    Dysphagia, post-stroke    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 hemorrhage of brain ([facility] 02/26/2022   Cough [id number]  Acute metabolic encephalopathy [id number]  Obstructive hydrocephalus ([facility] [employer] 02/19/2022   Dyslipidemia 02/19/2022   Dysphagia 02/19/2022   AKI (acute kidney injury) ([facility]    Anemia    Pleural effusion on right  Respiratory failure requiring intubation (HCC)    Cerebral edema (HCC)    Chronic atrial fibrillation (HCC)    Controlled type 2 diabetes mellitus with hyperglycemia, without long-term current use of insulin (HCC)    ICH (intracerebral hemorrhage) ([place]) 02/10/2022   Intracranial hemorrhage (HCC)    Anticoagulated    Moderate tricuspid regurgitation 07/26/2021   Moderate aortic valve stenosis 03/08/2020    MDS (myelodysplastic syndrome) ([place]) 02/18/2020   Macrocytic anemia 01/13/2020   Spleen enlargement 01/13/2020   Bilateral carotid artery stenosis 07/14/2018   Atrial fibrillation, chronic ([place]) 07/14/2018   Type 2 diabetes with nephropathy ([place]) 02/14/2016   Essential hypertension 08/16/2014   Hyperlipemia, mixed 08/16/2014   Renal insufficiency 08/16/2014    ONSET DATE: 02/10/2022  REFERRING DIAG:  CVA  THERAPY DIAG:  Coordination Muscle weakness (generalized)  Other lack of coordination  Rationale for Evaluation and Treatment: Rehabilitation  SUBJECTIVE:  SUBJECTIVE STATEMENT: Pt reports he drove for the first time in a year over the weekend and feels like he did well.   Pt accompanied by: significant other  PERTINENT HISTORY: Pt. is a 77 y. 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 Pt. received outpt. OT services following the CVA for Left sided weakness, and incoordination. Pt. was recently hospitalized from 1/22-1/25/24 for Obstructive Jaundice. PMHx includes: MDS, Chronic A-Fib, Essential HTN, Type II DM, Dyslipidemia, Presence of Watchman Left atrial appendage closure device, GERD  PRECAUTIONS: None  WEIGHT BEARING RESTRICTIONS: No  PAIN:  Are you having pain? No  FALLS: Has patient fallen in last 6 months? No  LIVING ENVIRONMENT: Lives with: lives with their spouse Lives in: House/apartment one storey, can fully live on the 1st floor. Stairs: Ramped entrance  Has following equipment at home: 4 wheeled walker, w/c-manual, grab bars, shower chair, HH shower head, toilet riser with arms.  PLOF: Independent  PATIENT GOALS: To improve arm and hand function   OBJECTIVE:   HAND DOMINANCE: Right  ADLs: Transfers/ambulation related to ADLs: Eating: Difficulty with motor control control Grooming:  Independent UB Dressing:  Diffiulty buttoning when no looking directly at the buttons.  LB  Dressing: Independent donning pants, socks, and slide on shoes. Toileting: Independent Bathing: Independent, wife assists with towel. Tub Shower transfers: Modified Independence  in the shower, CGA transfers out of shoer.   IADLs: Shopping:  Pt.'s wife performs, occasionally uses grocery store scooters Light housekeeping: Wife performs housekeeping tasks. Meal Prep: Prepares light snacks, and light meal prep Community mobility: Relies on family and friends Medication management: Independent Financial management: Does  with wife; No changes in how they are doing them Handwriting: 100% legible for name: Wife has noticed some changes with writing  Hobbies: Plays the guitar, workshop, music shop.  MOBILITY STATUS: Needs Assist: uses a rollator  POSTURE COMMENTS:  No Significant postural limitations Sitting balance: WFL  ACTIVITY TOLERANCE: Activity tolerance:  Limited  FUNCTIONAL OUTCOME MEASURES:  FOTO: 59; TR score: 60  UPPER EXTREMITY ROM:    Active ROM Right Eval  Left Eval [facility]  Shoulder flexion 106   Shoulder abduction 85   Shoulder adduction    Shoulder extension    Shoulder internal rotation    Shoulder external rotation    Elbow flexion WFL   Elbow extension WFL   Wrist flexion    Wrist extension WFL   Wrist ulnar deviation    Wrist radial deviation    Wrist pronation    Wrist supination  WFL   (Blank rows = not tested)  Digit flexion to the [facility]:  2nd: 0cm, 3rd: 1.5cm, 4th: 2.5cm, 5th: 0cm   UPPER EXTREMITY MMT:     MMT Right eval Left eval  Shoulder flexion 3- 4  Shoulder abduction 3- 4  Shoulder adduction    Shoulder extension    Shoulder internal rotation    Shoulder external rotation    Middle trapezius    Lower trapezius    Elbow flexion 4 4  Elbow extension 4 4  Wrist flexion    Wrist extension 4 4  Wrist ulnar deviation    Wrist radial deviation    Wrist pronation    Wrist supination    (Blank rows = not tested)  HAND  FUNCTION: Grip strength: N/A secondary to arthritis, and 3rd digit trigger finger    Lateral Pinch: R: 23#, L: 20#  3 Pt. Pinch: R: 22#, L: 21#  COORDINATION: 9 Hole Peg test: Right: 25 sec; Left: 2 min. & 34  sec  SENSATION: WFL Light touch: WFL Stereognosis: WFL  EDEMA:   N/A  MUSCLE TONE:  Intact  COGNITION: Overall cognitive status: Within functional limits for tasks assessed  VISION: Subjective report: Pt. reports double vision initially at the onset of the CVA with difficulty scanning to left when reading. Pt. Reports this has improved, and the double vision has resolved.   PERCEPTION: WFL  PRAXIS: Impaired motor control, and coordination   TODAY'S TREATMENT:                                                                                                                              DATE: 01/24/2023   Neuromuscular re-education:   Pt. Worked on grasping coins from a tabletop surface, controlled reaching up in preparation for placing them into a resistive container, and pushing them through the slot while isolating his 2nd digit. Pt. worked on moving the coins to the edge of a flat surface isolating his 2nd digit to the thumb to form the grasp in preparation for placing them into a resistive container. Pt. Required alternating weightbearing for proprioception through the LUE, and hand in between reps. Pt. Worked on controlled reaching, and moving Saebo rings through 4 horizontal rungs of progressively increasing heights. Pt. Worked on motor control skills needed to move them through the rungs without touching it to the horizontal dowels.  PATIENT EDUCATION: Education details:  LUE Nature conservation officer Person educated: pt Education method: Explanation, Demonstration, and Tactile cues Education comprehension: verbalized understanding, returned demonstration, verbal cues required, tactile cues required, and needs further education  Home Exercise Program:  Continue  to assess and provide as indicated  GOALS: Goals reviewed with patient?  SHORT TERM GOALS: Target date: 02/18/2023    Pt. will demonstrate independence with HEP for LUE functioning Baseline: Eval: No current HEP Goal status: INITIAL  LONG TERM GOALS: Target date:  04/01/2023    Pt. will improve FOTO score by 1  point for patient perceived improvement with assessment specific ADLs, and IADLs.  Baseline: Eval: FOTO score: 59; TR score: 60 Goal status: INITIAL  2.  Pt. will independently modulate the accuracy of holding items without overgripping when reaching for items 50% of the time Baseline: Eval: Pt. is consistently overpowering, and over gripping items when grasping them with the left hand Goal status: INITIAL  3.  Pt. will improve left hand [facility] skills by 10 sec. to improve left hand grasp on objects.  Baseline: Eval: R: 25 sec. L: 2 min. & 34 sec.  Goal status: INITIAL  4.  Pt. Will utilize compensatory strategies for left hand function motor coordination 75% during ADLs, and IADL tasks. Baseline: Eval: Minimal use of compensatory strategies during ADLs, and IADLs. Goal status: INITIAL  5.  Pt. Will use compensatory strategies for left hand function 75% of the time when utilizing multiple body systems, dual tasking, and using the hand in a variety of different contexts. Baseline: Eval: Pt. Consistently has difficulty using his left hand to complete tasks when he is not looking directly at his hand, and has more difficulty using his left hand when standing, or attempting to walk with the walker.  Goal status: INITIAL  ASSESSMENT:   CLINICAL IMPRESSION:  Pt. requires proximal LUE support, and stabilization for more distal control. Pt. was able to achieve more proximal control during reaching tasks after relaxing the left shoulder prior to moving the Saebo rings through the horizontal rungs. Pt. presented with decreased motor control when attempting [facility] tasks at an elevated plane.  Pt. continues to present with difficulty modulating graded pressure when grasping various sizes, textures, an weights of items. Pt continues to benefit from OT services to improve Left hand function, improve LUE ataxia, use of graded pressure with grasping, and [facility] skills in order to be able to use his hand when challenged in multiple contexts, and with dual tasking. Pt will benefit from education about compensatory strategies for left hand function during ADLs, and IADLs.    PERFORMANCE DEFICITS: in functional skills including ADLs, IADLs, coordination, dexterity, ROM, strength, Fine motor control, and Gross motor control, cognitive skills including attention, memory, problem solving, and safety awareness, and psychosocial skills including coping strategies, environmental adaptation, habits, and routines and behaviors.   IMPAIRMENTS: are limiting patient from ADLs, IADLs, play, leisure, and social participation.   CO-MORBIDITIES: may have co-morbidities  that affects occupational performance. Patient will benefit from skilled OT to address above impairments and improve overall function.  MODIFICATION OR ASSISTANCE TO COMPLETE EVALUATION: Min-Moderate modification of tasks or assist with assess necessary to complete an evaluation.  OT OCCUPATIONAL PROFILE AND HISTORY: Detailed assessment: Review of records and additional review of physical, cognitive, psychosocial history related to current functional performance.  CLINICAL DECISION MAKING: Moderate - several treatment options, min-mod task modification necessary  REHAB POTENTIAL: Good  EVALUATION COMPLEXITY: Moderate    PLAN:  OT FREQUENCY: 2x/week  OT DURATION: 12 weeks  PLANNED INTERVENTIONS: self care/ADL training, therapeutic exercise, therapeutic activity, patient/family education, cognitive remediation/compensation, and DME and/or AE instructions, neuromuscular re-education  RECOMMENDED OTHER SERVICES: OT services.  CONSULTED AND  AGREED WITH PLAN OF CARE: Patient and family member/caregiver-wife  PLAN FOR NEXT SESSION: Initiate treatment  Harrel Carina, MS, OTR/L   Harrel Carina, OT 01/24/2023, 9:20 AM

## 2023-01-28 ENCOUNTER — Ambulatory Visit: Payer: Medicare Other

## 2023-01-28 ENCOUNTER — Ambulatory Visit: Payer: Medicare Other | Attending: Internal Medicine

## 2023-01-28 DIAGNOSIS — R262 Difficulty in walking, not elsewhere classified: Secondary | ICD-10-CM

## 2023-01-28 DIAGNOSIS — R278 Other lack of coordination: Secondary | ICD-10-CM | POA: Diagnosis present

## 2023-01-28 DIAGNOSIS — M6281 Muscle weakness (generalized): Secondary | ICD-10-CM | POA: Diagnosis present

## 2023-01-28 DIAGNOSIS — R2681 Unsteadiness on feet: Secondary | ICD-10-CM

## 2023-01-28 DIAGNOSIS — I69254 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: Secondary | ICD-10-CM | POA: Diagnosis present

## 2023-01-28 DIAGNOSIS — R41841 Cognitive communication deficit: Secondary | ICD-10-CM | POA: Insufficient documentation

## 2023-01-28 DIAGNOSIS — R2689 Other abnormalities of gait and mobility: Secondary | ICD-10-CM

## 2023-01-28 DIAGNOSIS — R471 Dysarthria and anarthria: Secondary | ICD-10-CM | POA: Insufficient documentation

## 2023-01-28 NOTE — Therapy (Signed)
OUTPATIENT OCCUPATIONAL THERAPY NEURO TREATMENT  Patient Name: Vernon [name] MRN: [id number] DOB:Jun 18, 1946, 77 y.o., male Today's Date: 01/28/2023  REFERRING PROVIDER:  Harrel Lemon, MD  END OF SESSION:  OT End of Session - 01/28/23 1624     Visit Number 7    Number of Visits 24    Date for OT Re-Evaluation 04/01/23    Authorization Time Period Progress reporting period starting 01/07/2023    OT Start Time 1100    OT Stop Time 1145    OT Time Calculation (min) 45 min    Equipment Utilized During Treatment transport chair    Activity Tolerance Patient tolerated treatment well    Behavior During Therapy WFL for tasks assessed/performed             Past Medical History:  Diagnosis Date   Anemia    Aortic stenosis    Arthritis    Complication of anesthesia    hard time getting bp up after knee replacement   Coronary artery disease    Diabetes mellitus without complication (HCC)    GERD (gastroesophageal reflux disease)    occ tums prn   History of hiatal hernia    Hypertension    MDS (myelodysplastic syndrome) ([place])    Presence of Watchman left atrial appendage closure device 07/26/2022   68m Watchman FLX with Dr. 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;  Surgeon: EKristeen Miss MD;  [facility]  Service: Neurosurgery;  Laterality: N/A;   JOINT REPLACEMENT Right 2010   LEFT ATRIAL APPENDAGE OCCLUSION N/A 07/26/2022   Procedure: LEFT ATRIAL APPENDAGE OCCLUSION;  Surgeon: LVickie Epley MD;  [facility];  Service: Cardiovascular;  Laterality: N/A;   SHOULDER ARTHROSCOPY WITH ROTATOR CUFF REPAIR AND OPEN BICEPS TENODESIS Right 11/09/2019   Procedure: RIGHT SHOULDER ARTHROSCOPY WITH SUBSCAPULARIS REPAIR, SUBACROMIAL DECOMPRESSION,MINI OPEN ROTATOR CUFF REPAIR;  Surgeon: PLeim Fabry MD;   Location: [facility];  Service: Orthopedics;  Laterality: Right;   TEE WITHOUT CARDIOVERSION N/A 07/26/2022   Procedure: TRANSESOPHAGEAL ECHOCARDIOGRAM (TEE);  Surgeon: LVickie Epley MD;  [facility];  Service: Cardiovascular;  Laterality: N/A;   Patient Active Problem List   Diagnosis Date Noted   Urinary tract infection without hematuria 12/19/2022   Painless jaundice 12/18/2022   Elevated LFTs 12/18/2022   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   Obstructive jaundice 12/17/2022   Atrial fibrillation ([facility] 07/26/2022   Presence of Watchman left atrial appendage closure device 07/26/2022   Proteinuria, unspecified 05/23/2022   Simple renal cyst 05/23/2022   Long term current use of anticoagulant 04/04/2022   Rotator cuff syndrome 04/04/2022   Exposure to potentially hazardous substance 04/04/2022   Gout 04/04/2022   Osteoarthritis 04/04/2022   Osteoporosis 04/04/2022   Other specified diseases of hair and hair follicles 00000000  Pain in joint, lower leg 04/04/2022   Problem related to unspecified psychosocial circumstances 04/04/2022   General medical examination for administrative purposes 04/04/2022   Stage 3b chronic kidney disease ([facility]    Dysphagia, post-stroke    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 hemorrhage of brain ([facility] 02/26/2022   Cough [id number]  Acute metabolic encephalopathy [id number]  Obstructive hydrocephalus ([place] 02/19/2022   Dyslipidemia 02/19/2022   Dysphagia 02/19/2022   AKI (acute kidney injury) ([facility]    Anemia    Pleural effusion on right  Respiratory failure requiring intubation (HCC)    Cerebral edema (HCC)    Chronic atrial fibrillation (HCC)    Controlled type 2 diabetes mellitus with hyperglycemia, without long-term current use of insulin (HCC)    ICH (intracerebral hemorrhage) ([place]) 02/10/2022   Intracranial hemorrhage (HCC)    Anticoagulated    Moderate tricuspid regurgitation 07/26/2021   Moderate aortic valve stenosis 03/08/2020    MDS (myelodysplastic syndrome) ([place]) 02/18/2020   Macrocytic anemia 01/13/2020   Spleen enlargement 01/13/2020   Bilateral carotid artery stenosis 07/14/2018   Atrial fibrillation, chronic ([place]) 07/14/2018   Type 2 diabetes with nephropathy ([place]) 02/14/2016   Essential hypertension 08/16/2014   Hyperlipemia, mixed 08/16/2014   Renal insufficiency 08/16/2014    ONSET DATE: 02/10/2022  REFERRING DIAG:  CVA  THERAPY DIAG:  Coordination Muscle weakness (generalized)  Other lack of coordination  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 Vernon [name] accompanied by: significant other  PERTINENT HISTORY: Vernon [name]. is a 9 y. 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 Vernon [name]. received outpt. OT services following the CVA for Left sided weakness, and incoordination. Vernon [name]. was recently hospitalized from 1/22-1/25/24 for Obstructive Jaundice. PMHx includes: MDS, Chronic A-Fib, Essential HTN, Type II DM, Dyslipidemia, Presence of Watchman Left atrial appendage closure device, GERD  PRECAUTIONS: None  WEIGHT BEARING RESTRICTIONS: No  PAIN:  Are you having pain? No  FALLS: Has patient fallen in last 6 months? No  LIVING ENVIRONMENT: Lives with: lives with their spouse Lives in: House/apartment one storey, can fully live on the 1st floor. Stairs: Ramped entrance  Has following equipment at home: 4 wheeled walker, w/c-manual, grab bars, shower chair, HH shower head, toilet riser with arms.  PLOF: Independent  PATIENT GOALS: To improve arm and hand function   OBJECTIVE:   HAND DOMINANCE: Right  ADLs: Transfers/ambulation related to ADLs: Eating: Difficulty with motor control control Grooming:   Independent UB Dressing:  Diffiulty buttoning when no looking directly at the buttons.  LB Dressing: Independent donning pants, socks, and slide on shoes. Toileting: Independent Bathing: Independent, wife assists with towel. Tub Shower transfers: Modified Independence  in the shower, CGA transfers out of shoer.   IADLs: Shopping:  Vernon [name].'s wife performs, occasionally uses grocery store scooters Light housekeeping: Wife performs housekeeping tasks. Meal Prep: Prepares light snacks, and light meal prep Community mobility: Relies on family and friends Medication management: Independent Financial management: Does  with wife; No changes in how they are doing them Handwriting: 100% legible for name: Wife has noticed some changes with writing  Hobbies: Plays the guitar, workshop, music shop.  MOBILITY STATUS: Needs Assist: uses a rollator  POSTURE COMMENTS:  No Significant postural limitations Sitting balance: WFL  ACTIVITY TOLERANCE: Activity tolerance:  Limited  FUNCTIONAL OUTCOME MEASURES:  FOTO: 59; TR score: 60  UPPER EXTREMITY ROM:    Active ROM Right Eval  Left Eval [facility]  Shoulder flexion 106   Shoulder abduction 85   Shoulder adduction    Shoulder extension    Shoulder internal rotation    Shoulder external rotation    Elbow flexion WFL   Elbow extension WFL   Wrist flexion    Wrist extension WFL   Wrist ulnar deviation    Wrist radial deviation    Wrist pronation  Wrist supination WFL   (Blank rows = not tested)  Digit flexion to the [facility]:  2nd: 0cm, 3rd: 1.5cm, 4th: 2.5cm, 5th: 0cm   UPPER EXTREMITY MMT:     MMT Right eval Left eval  Shoulder flexion 3- 4  Shoulder abduction 3- 4  Shoulder adduction    Shoulder extension    Shoulder internal rotation    Shoulder external rotation    Middle trapezius    Lower trapezius    Elbow flexion 4 4  Elbow extension 4 4  Wrist flexion    Wrist extension 4 4  Wrist ulnar deviation    Wrist radial  deviation    Wrist pronation    Wrist supination    (Blank rows = not tested)  HAND FUNCTION: Grip strength: N/A secondary to arthritis, and 3rd digit trigger finger    Lateral Pinch: R: 23#, L: 20#  3 Vernon [name]. Pinch: R: 22#, L: 21#  COORDINATION: 9 Hole Peg test: Right: 25 sec; Left: 2 min. & 34  sec  SENSATION: WFL Light touch: WFL Stereognosis: WFL  EDEMA:   N/A  MUSCLE TONE:  Intact  COGNITION: Overall cognitive status: Within functional limits for tasks assessed  VISION: Subjective report: Vernon [name]. reports double vision initially at the onset of the CVA with difficulty scanning to left when reading. Vernon [name]. Reports this has improved, and the double vision has resolved.   PERCEPTION: WFL  PRAXIS: Impaired motor control, and coordination   TODAY'S TREATMENT:                                                                                                                              DATE: 01/24/2023  Neuromuscular re-education: Practiced targeted reaching with Vernon [name] clipping resistive clothespins to even numbers on yardstick using a lateral pinch pattern.  Completed a 2nd round clipping pins to odd numbers on the yardstick using a 3 point pinch pattern.  OT provided positional changes to the yardstick throughout each round to facilitate forward and lateral reaching patterns for the LUE.  Practiced L hand FMC/dexterity skills working with [place] discs.  Vernon [name] practiced placing, removing, and flipping discs using L hand, working on speed and moving the discs without resting hand on table top to further challenge coordination throughout the LUE.    PATIENT EDUCATION: Education details:  LUE Nature conservation officer Person educated: Vernon [name] Education method: Explanation, Demonstration, and Tactile cues Education comprehension: verbalized understanding, returned demonstration, verbal cues required, tactile cues required, and needs further education  Home Exercise Program:  Continue to assess  and provide as indicated  GOALS: Goals reviewed with patient?  SHORT TERM GOALS: Target date: 02/18/2023    Vernon [name]. will demonstrate independence with HEP for LUE functioning Baseline: Eval: No current HEP Goal status: INITIAL  LONG TERM GOALS: Target date:  04/01/2023    Vernon [name]. will improve FOTO score by 1 point for patient perceived improvement with assessment specific ADLs, and IADLs.  Baseline: Eval: FOTO score:  59; TR score: 60 Goal status: INITIAL  2.  Vernon [name]. will independently modulate the accuracy of holding items without overgripping when reaching for items 50% of the time Baseline: Eval: Vernon [name]. is consistently overpowering, and over gripping items when grasping them with the left hand Goal status: INITIAL  3.  Vernon [name]. will improve left hand [facility] skills by 10 sec. to improve left hand grasp on objects.  Baseline: Eval: R: 25 sec. L: 2 min. & 34 sec.  Goal status: INITIAL  4.  Vernon [name]. Will utilize compensatory strategies for left hand function motor coordination 75% during ADLs, and IADL tasks. Baseline: Eval: Minimal use of compensatory strategies during ADLs, and IADLs. Goal status: INITIAL  5.  Vernon [name]. Will use compensatory strategies for left hand function 75% of the time when utilizing multiple body systems, dual tasking, and using the hand in a variety of different contexts. Baseline: Eval: Vernon [name]. Consistently has difficulty using his left hand to complete tasks when he is not looking directly at his hand, and has more difficulty using his left hand when standing, or attempting to walk with the walker.  Goal status: INITIAL  ASSESSMENT:   CLINICAL IMPRESSION: Vernon [name] reports 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  Vernon [name] states that he occasionally tries to play his bass but frustrates quickly as his fingers won't do what they're supposed to do.  OT encouraged Vernon [name] practice daily for short periods to limit frustration level, focus on simple songs, or just chords, slowing pace to allow increased time  for motor planning.  Worked on [employer] discs to challenge L hand FMC/dexterity skills, but continued to reinforce importance of practicing with Vernon [name]'s bass at home for motor retraining to target those particular skills.  Practiced targeted reaching clipping resistive pins onto a yardstick.  Vernon [name] presents with decreased motor control when attempting [facility] tasks at an elevated plane.  Spouse present towards end of session and verbalized that she can tell an improvement in Vernon [name]'s LUE motor control.  Vernon [name] continues to benefit from OT services to improve Left hand function, improve LUE ataxia, use of graded pressure with grasping, and [facility] skills in order to be able to use his hand when challenged in multiple contexts, and with dual tasking. Vernon [name] will benefit from education about compensatory strategies for left hand function during ADLs, and IADLs.    PERFORMANCE DEFICITS: in functional skills including ADLs, IADLs, coordination, dexterity, ROM, strength, Fine motor control, and Gross motor control, cognitive skills including attention, memory, problem solving, and safety awareness, and psychosocial skills including coping strategies, environmental adaptation, habits, and routines and behaviors.   IMPAIRMENTS: are limiting patient from ADLs, IADLs, play, leisure, and social participation.   CO-MORBIDITIES: may have co-morbidities  that affects occupational performance. Patient will benefit from skilled OT to address above impairments and improve overall function.  MODIFICATION OR ASSISTANCE TO COMPLETE EVALUATION: Min-Moderate modification of tasks or assist with assess necessary to complete an evaluation.  OT OCCUPATIONAL PROFILE AND HISTORY: Detailed assessment: Review of records and additional review of physical, cognitive, psychosocial history related to current functional performance.  CLINICAL DECISION MAKING: Moderate - several treatment options, min-mod task modification necessary  REHAB POTENTIAL:  Good  EVALUATION COMPLEXITY: Moderate    PLAN:  OT FREQUENCY: 2x/week  OT DURATION: 12 weeks  PLANNED INTERVENTIONS: self care/ADL training, therapeutic exercise, therapeutic activity, patient/family education, cognitive remediation/compensation, and DME and/or AE instructions, neuromuscular re-education  RECOMMENDED OTHER SERVICES: OT services.  CONSULTED AND AGREED WITH PLAN OF  CARE: Patient and family member/caregiver-wife  PLAN FOR NEXT SESSION: Initiate treatment  Leta Speller, MS, OTR/L   Darleene Cleaver, OT 01/28/2023, 4:27 PM

## 2023-01-28 NOTE — Therapy (Signed)
OUTPATIENT PHYSICAL THERAPY NEURO TREATMENT   Patient Name: Vernon [name] MRN: [id number] DOB:10/21/1946, 77 y.o., male Today's Date: 01/29/2023   PCP:   Baxter Hire, MD   REFERRING PROVIDER:   Baxter Hire, MD    END OF SESSION:  PT End of Session - 01/28/23 1015     Visit Number 4    Number of Visits 25    Date for PT Re-Evaluation 04/03/23    PT Start Time [id number]    PT Stop Time 1059    PT Time Calculation (min) 44 min    Equipment Utilized During Treatment Gait belt    Activity Tolerance Patient tolerated treatment well    Behavior During Therapy WFL for tasks assessed/performed               Past Medical History:  Diagnosis Date   Anemia    Aortic stenosis    Arthritis    Complication of anesthesia    hard time getting bp up after knee replacement   Coronary artery disease    Diabetes mellitus without complication (HCC)    GERD (gastroesophageal reflux disease)    occ tums prn   History of hiatal hernia    Hypertension    MDS (myelodysplastic syndrome) ([place])    Presence of Watchman left atrial appendage closure device 07/26/2022   41m Watchman FLX with Dr. 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;  Surgeon: EKristeen Miss MD;  [facility]  Service: Neurosurgery;  Laterality: N/A;   JOINT REPLACEMENT Right 2010   LEFT ATRIAL APPENDAGE OCCLUSION N/A 07/26/2022   Procedure: LEFT ATRIAL APPENDAGE OCCLUSION;  Surgeon: LVickie Epley MD;  [facility];  Service: Cardiovascular;  Laterality: N/A;   SHOULDER ARTHROSCOPY WITH ROTATOR CUFF REPAIR AND OPEN BICEPS TENODESIS Right 11/09/2019   Procedure: RIGHT SHOULDER ARTHROSCOPY WITH SUBSCAPULARIS REPAIR, SUBACROMIAL DECOMPRESSION,MINI OPEN ROTATOR CUFF REPAIR;  Surgeon: PLeim Fabry MD;  Location: [facility];  Service:  Orthopedics;  Laterality: Right;   TEE WITHOUT CARDIOVERSION N/A 07/26/2022   Procedure: TRANSESOPHAGEAL ECHOCARDIOGRAM (TEE);  Surgeon: LVickie Epley MD;  [facility];  Service: Cardiovascular;  Laterality: N/A;   Patient Active Problem List   Diagnosis Date Noted   Urinary tract infection without hematuria 12/19/2022   Painless jaundice 12/18/2022   Elevated LFTs 12/18/2022   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   Obstructive jaundice 12/17/2022   Atrial fibrillation ([place] 07/26/2022   Presence of Watchman left atrial appendage closure device 07/26/2022   Proteinuria, unspecified 05/23/2022   Simple renal cyst 05/23/2022   Long term current use of anticoagulant 04/04/2022   Rotator cuff syndrome 04/04/2022   Exposure to potentially hazardous substance 04/04/2022   Gout 04/04/2022   Osteoarthritis 04/04/2022   Osteoporosis 04/04/2022   Other specified diseases of hair and hair follicles 00000000  Pain in joint, lower leg 04/04/2022   Problem related to unspecified psychosocial circumstances 04/04/2022   General medical examination for administrative purposes 04/04/2022   Stage 3b chronic kidney disease ([place]    Dysphagia, post-stroke    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 hemorrhage of brain ([place] 02/26/2022   Cough [id number]  Acute metabolic encephalopathy [id number]  Obstructive hydrocephalus ([place] 02/19/2022   Dyslipidemia 02/19/2022   Dysphagia 02/19/2022   AKI (acute kidney injury) ([place]    Anemia    Pleural  effusion on right    Respiratory failure requiring intubation (HCC)    Cerebral edema (HCC)    Chronic atrial fibrillation (HCC)    Controlled type 2 diabetes mellitus with hyperglycemia, without long-term current use of insulin (HCC)    ICH (intracerebral hemorrhage) ([place]) 02/10/2022   Intracranial hemorrhage (HCC)    Anticoagulated    Moderate tricuspid regurgitation 07/26/2021   Moderate aortic valve stenosis 03/08/2020   MDS (myelodysplastic syndrome)  ([place]) 02/18/2020   Macrocytic anemia 01/13/2020   Spleen enlargement 01/13/2020   Bilateral carotid artery stenosis 07/14/2018   Atrial fibrillation, chronic ([place]) 07/14/2018   Type 2 diabetes with nephropathy ([place]) 02/14/2016   Essential hypertension 08/16/2014   Hyperlipemia, mixed 08/16/2014   Renal insufficiency 08/16/2014    ONSET DATE: 02/10/2022  REFERRING DIAG:  I63.9 (ICD-10-CM) - Cerebral infarction, unspecified  I63.9 (ICD-10-CM) - CVA (cerebral vascular accident) ([place])    THERAPY DIAG:  Muscle weakness (generalized)  Other lack of coordination  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)  Unsteadiness on feet  Difficulty in walking, not elsewhere classified  Other abnormalities of gait and mobility  Rationale for Evaluation and Treatment: Rehabilitation  SUBJECTIVE:                                                                                                                                                                                             SUBJECTIVE STATEMENT:  Patient reports doing okay- States Left knee still limits him   Pt accompanied by: self   PERTINENT HISTORY:  The pt is a pleasant 77 yo male referred to PT eval 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 Pt hospitalized 1/22-1/25/24 for obstructive Jaundice. Pt reports it was found he had a UTI with recent hospital stay. He wants to improve his endurance and balance. PMH significant for MDS, chronic a-fib, HTN, DMII, Presence of Watchman Left atrial appendage closure device, anemia, arthritis, hx of hiatal hernia, hx craniotomy 02/10/22, L atrial appendage occlusion 07/26/22, TEE without cardioversion 07/26/2022    PAIN:  Are you having pain? 6/10 in the L knee when walking.  PRECAUTIONS: Fall  WEIGHT BEARING RESTRICTIONS: No  FALLS: Has patient fallen in last 6 months? No  LIVING  ENVIRONMENT: Lives with: lives with their family and lives with their spouse Lives in:  Stairs:  Has following equipment at home: Environmental consultant - 4 wheeled  PLOF: Independent  PATIENT GOALS: improve coordination, endurance and balance  OBJECTIVE:   DIAGNOSTIC FINDINGS:   02/20/22 CT HEAD: "IMPRESSION: No evidence of interval acute intracranial abnormality.   Continued evolution  of postoperative changes from prior left suboccipital craniectomy and left posterior fossa hematoma evacuation. Residual hemorrhage within the left cerebellar hemisphere, similar to slightly decreased from the prior head CT of 02/18/2022. Unchanged left cerebellar edema. Stable mass effect with partial effacement of the fourth ventricle. No evidence of hydrocephalus.   Persistent edema and trace persistent hemorrhage within the right occipital lobe along the tract from prior attempted ventricular catheter placement.   Stable chronic small vessel ischemic changes within the cerebral white matter.   Paranasal sinus disease, as described."  MR BRAIN 02/12/22: " IMPRESSION: 1. Left suboccipital craniectomy for hematoma evacuation. Roughly 13 mL residual left cerebellar blood tracking across midline to the right. Cerebellar edema with partial effacement of the 4th ventricle. But basilar cistern patency is adequate, with no ventriculomegaly.   2. Sequelae of attempted EVD placement across the splenium of the corpus callosum. Trace intraventricular and subarachnoid hemorrhage.   3. Background mild to moderate nonspecific cerebral white matter signal changes.   4. 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."  Please refer to chart for full details    COGNITION: Overall cognitive status: Within functional limits for tasks assessed   SENSATION: WFL BLE   COORDINATION: Impaired LUE WFL BLE  EDEMA:  None observed at present    POSTURE: rounded  shoulders, forward head, increased thoracic kyphosis, and flexed trunk   LOWER EXTREMITY MMT:    Grossly 4+/5 BLE, greatest deficits in hip musculature    TRANSFERS: Assistive device utilized:  performs STS hands free to RW upon standing   Sit to stand: Modified independence - grabs on to RW in standing Stand to sit: Complete Independence  RAMP:  Level of Assistance: Modified independence per report  GAIT: Gait pattern: use of RW, crouched posture with increased BUE weightbearing on RW, decreased bilat hip ext Distance walked: 10 meters  Level of assistance: CGA   FUNCTIONAL TESTS:    6MWT - deferred to next 1-2 visits  5xSTS: 14.89 sec hands-free  BERG: initiated on this date (see flow chart), will need to complete next 1-2 visits. Unable to complete due to time limitation, pt did finish the following which were scored STS: 4 Standing unsupported: 3 Sitting with back unsupported: 4 Stand to Sit: 4 Transfers: 3 Standing with EC unsupported: 3 Standing reach forward: 3 Standing turn to look behind: 4 Turn 360 deg: 3 Standing one foot in front: 0   10MWT: 0.82 m/s some L knee pain. Pt reports he can feel his feet trying to cross over. Pt with crouched posture with walking, decreased bilat hip ext, increased UE weightbearing   PATIENT SURVEYS:  OT CVA FOTO only - not completed for PT   TODAY'S TREATMENT: DATE: 01/29/23   TherEx:  Seated hip adduction into physioball, 3 sec holds, x15 Seated hip abduction into BTB, 2x15 Seated LAQ with hip adduction into rainbow physioball, 2x15 each LE   Neuro: Step tap at support bar- Initially with BUE support then progressed to No UE support x 20 reps each LE Standing at support bar- performing dual task activities- Alphabet with letter magnets; assorting magnets into 4 corners and front top into 5 colors  - changing feet placement and surface- from initially with feet wide, normal, narrow, 1/2 step staggered then standing  wide on airex, narrow on airex pad.  Step march on airex pad with 4# AW x 20 reps (challenging per patient- difficult with some UE support)   Ambulation in  hallway- using RW- CGA- some veering to left- but self corrected     PATIENT EDUCATION: Education details: exam findings, plan, testing Person educated: Patient and Spouse Education method: Explanation Education comprehension: verbalized understanding  HOME EXERCISE PROGRAM: To be initiated next 1-2 visits   GOALS: Goals reviewed with patient? Yes  SHORT TERM GOALS: Target date: 02/20/2023  Patient will be independent in home exercise program to improve strength/mobility for better functional independence with ADLs. Baseline: to be intiated Goal status: INITIAL    LONG TERM GOALS: Target date: 04/03/2023   Patient will complete five times sit to stand test in < 10 seconds indicating an increased LE strength and improved balance. Baseline: 14.89 sec Goal status: INITIAL  2.  Patient will increase 10 meter walk test to >1.75ms as to improve gait speed for better community ambulation and to reduce fall risk. Baseline:  0.82 m/s Goal status: INITIAL  3.  Patient will increase Berg Balance score by > 6 points to demonstrate decreased fall risk during functional activities. Baseline: 39/56 Goal status: INITIAL  4.  Patient will increase six minute walk test distance to >1000 for progression to community ambulator and improve gait ability Baseline: 795' Goal status: INITIAL  5.  Patient will demonstrate improved function as evidenced by a score of 62 on FOTO measure for full participation in activities at home and in the community. Baseline: 59 Goal status: INITIAL    ASSESSMENT:  CLINICAL IMPRESSION:  Patient was able to perform several balance activities without report of excessive left knee pain. He was very challenged with dual task activities incorporated narrowed stance with use of dynamic UE activity.  Pt will  continue to benefit from skilled therapy to address remaining deficits in order to improve overall QoL and return to PLOF.        OBJECTIVE IMPAIRMENTS: Abnormal gait, decreased activity tolerance, decreased balance, decreased coordination, decreased endurance, decreased knowledge of use of DME, decreased mobility, difficulty walking, decreased strength, impaired UE functional use, improper body mechanics, postural dysfunction, and pain.   ACTIVITY LIMITATIONS: carrying, lifting, bending, standing, squatting, stairs, transfers, and locomotion level  PARTICIPATION LIMITATIONS: meal prep, cleaning, laundry, driving, shopping, community activity, and yard work  PERSONAL FACTORS: Age, Past/current experiences, Time since onset of injury/illness/exacerbation, and 3+ comorbidities: PMH significant for MDS, chronic a-fib, HTN, DMII, Presence of Watchman Left atrial appendage closure device, anemia, arthritis, hx of hiatal hernia, hx craniotomy 02/10/22, L atrial appendage occlusion 07/26/22, TEE without cardioversion 07/26/2022  are also affecting patient's functional outcome.   REHAB POTENTIAL: Good  CLINICAL DECISION MAKING: Evolving/moderate complexity  EVALUATION COMPLEXITY: Moderate  PLAN:  PT FREQUENCY: 2x/week  PT DURATION: 12 weeks  PLANNED INTERVENTIONS: Therapeutic exercises, Therapeutic activity, Neuromuscular re-education, Balance training, Gait training, Patient/Family education, Self Care, Joint mobilization, Stair training, Vestibular training, Canalith repositioning, Visual/preceptual remediation/compensation, Orthotic/Fit training, DME instructions, Electrical stimulation, Wheelchair mobility training, Spinal mobilization, Cryotherapy, Moist heat, Splintting, Taping, Ultrasound, Manual therapy, and Re-evaluation  PLAN FOR NEXT SESSION:   Continue with strengthening exercises and balance related tasks  JOllen Bowl PT Physical Therapist - [facility] 01/29/23, 8:45 AM

## 2023-01-30 ENCOUNTER — Ambulatory Visit: Payer: Medicare Other

## 2023-01-30 DIAGNOSIS — I69254 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: Secondary | ICD-10-CM

## 2023-01-30 DIAGNOSIS — R262 Difficulty in walking, not elsewhere classified: Secondary | ICD-10-CM

## 2023-01-30 DIAGNOSIS — M6281 Muscle weakness (generalized): Secondary | ICD-10-CM | POA: Diagnosis not present

## 2023-01-30 DIAGNOSIS — R278 Other lack of coordination: Secondary | ICD-10-CM

## 2023-01-30 DIAGNOSIS — R2689 Other abnormalities of gait and mobility: Secondary | ICD-10-CM

## 2023-01-30 DIAGNOSIS — R2681 Unsteadiness on feet: Secondary | ICD-10-CM

## 2023-01-30 NOTE — Therapy (Signed)
OUTPATIENT PHYSICAL THERAPY NEURO TREATMENT   Patient Name: Vernon [name] MRN: [id number] DOB:Jan 04, 1946, 77 y.o., male Today's Date: 02/04/2023   PCP:   Baxter Hire, MD   REFERRING PROVIDER:   Baxter Hire, MD    END OF SESSION:  PT End of Session - 02/04/23 0020     Visit Number 5    Number of Visits 25    Date for PT Re-Evaluation 04/03/23    Progress Note Due on Visit 10    PT Start Time 1515    PT Stop Time 1558    PT Time Calculation (min) 43 min    Equipment Utilized During Treatment Gait belt    Activity Tolerance Patient tolerated treatment well    Behavior During Therapy WFL for tasks assessed/performed               Past Medical History:  Diagnosis Date   Anemia    Aortic stenosis    Arthritis    Complication of anesthesia    hard time getting bp up after knee replacement   Coronary artery disease    Diabetes mellitus without complication (HCC)    GERD (gastroesophageal reflux disease)    occ tums prn   History of hiatal hernia    Hypertension    MDS (myelodysplastic syndrome) ([place])    Presence of Watchman left atrial appendage closure device 07/26/2022   26m Watchman FLX with Dr. 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;  Surgeon: EKristeen Miss MD;  [facility];  Service: Neurosurgery;  Laterality: N/A;   JOINT REPLACEMENT Right 2010   LEFT ATRIAL APPENDAGE OCCLUSION N/A 07/26/2022   Procedure: LEFT ATRIAL APPENDAGE OCCLUSION;  Surgeon: LVickie Epley MD;  [facility];  Service: Cardiovascular;  Laterality: N/A;   SHOULDER ARTHROSCOPY WITH ROTATOR CUFF REPAIR AND OPEN BICEPS TENODESIS Right 11/09/2019   Procedure: RIGHT SHOULDER ARTHROSCOPY WITH SUBSCAPULARIS REPAIR, SUBACROMIAL DECOMPRESSION,MINI OPEN ROTATOR CUFF REPAIR;  Surgeon: PLeim Fabry MD;   Location: [facility];  Service: Orthopedics;  Laterality: Right;   TEE WITHOUT CARDIOVERSION N/A 07/26/2022   Procedure: TRANSESOPHAGEAL ECHOCARDIOGRAM (TEE);  Surgeon: LVickie Epley MD;  [facility];  Service: Cardiovascular;  Laterality: N/A;   Patient Active Problem List   Diagnosis Date Noted   Urinary tract infection without hematuria 12/19/2022   Painless jaundice 12/18/2022   Elevated LFTs 12/18/2022   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   Obstructive jaundice 12/17/2022   Atrial fibrillation ([place] 07/26/2022   Presence of Watchman left atrial appendage closure device 07/26/2022   Proteinuria, unspecified 05/23/2022   Simple renal cyst 05/23/2022   Long term current use of anticoagulant 04/04/2022   Rotator cuff syndrome 04/04/2022   Exposure to potentially hazardous substance 04/04/2022   Gout 04/04/2022   Osteoarthritis 04/04/2022   Osteoporosis 04/04/2022   Other specified diseases of hair and hair follicles 00000000  Pain in joint, lower leg 04/04/2022   Problem related to unspecified psychosocial circumstances 04/04/2022   General medical examination for administrative purposes 04/04/2022   Stage 3b chronic kidney disease ([place]    Dysphagia, post-stroke    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 hemorrhage of brain ([place] 02/26/2022   Cough [id number]  Acute metabolic encephalopathy [id number]  Obstructive hydrocephalus ([place] 02/19/2022   Dyslipidemia 02/19/2022   Dysphagia 02/19/2022   AKI (acute kidney injury) (  HCC)    Anemia    Pleural effusion on right    Respiratory failure requiring intubation (HCC)    Cerebral edema (HCC)    Chronic atrial fibrillation (HCC)    Controlled type 2 diabetes mellitus with hyperglycemia, without long-term current use of insulin (HCC)    ICH (intracerebral hemorrhage) ([place]) 02/10/2022   Intracranial hemorrhage (HCC)    Anticoagulated    Moderate tricuspid regurgitation 07/26/2021   Moderate aortic valve stenosis 03/08/2020    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[place]) 02/18/2020   Macrocytic anemia 01/13/2020   Spleen enlargement 01/13/2020   Bilateral carotid artery stenosis 07/14/2018   Atrial fibrillation, chronic ([place]) 07/14/2018   Type 2 diabetes with nephropathy ([place]) 02/14/2016   Essential hypertension 08/16/2014   Hyperlipemia, mixed 08/16/2014   Renal insufficiency 08/16/2014    ONSET DATE: 02/10/2022  REFERRING DIAG:  I63.9 (ICD-10-CM) - Cerebral infarction, unspecified  I63.9 (ICD-10-CM) - CVA (cerebral vascular accident) ([place])    THERAPY DIAG:  Muscle weakness (generalized)  Other lack of coordination  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)  Unsteadiness on feet  Difficulty in walking, not elsewhere classified  Other abnormalities of gait and mobility  Rationale for Evaluation and Treatment: Rehabilitation  SUBJECTIVE:                                                                                                                                                                                             SUBJECTIVE STATEMENT:  Patient reports doing okay- States Left knee still limits him   Pt accompanied by: self   PERTINENT HISTORY:  The pt is a pleasant 77 yo male referred to PT eval 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 Pt hospitalized 1/22-1/25/24 for obstructive Jaundice. Pt reports it was found he had a UTI with recent hospital stay. He wants to improve his endurance and balance. PMH significant for MDS, chronic a-fib, HTN, DMII, Presence of Watchman Left atrial appendage closure device, anemia, arthritis, hx of hiatal hernia, hx craniotomy 02/10/22, L atrial appendage occlusion 07/26/22, TEE without cardioversion 07/26/2022    PAIN:  Are you having pain? 6/10 in the L knee when walking.  PRECAUTIONS: Fall  WEIGHT BEARING RESTRICTIONS: No  FALLS: Has patient fallen  in last 6 months? No  LIVING ENVIRONMENT: Lives with: lives with their family and lives with their spouse Lives in:  Stairs:  Has following equipment at home: Environmental consultant - 4 wheeled  PLOF: Independent  PATIENT GOALS: improve coordination, endurance and balance  OBJECTIVE:   DIAGNOSTIC FINDINGS:   02/20/22 CT HEAD: "IMPRESSION: No evidence  of interval acute intracranial abnormality.   Continued evolution of postoperative changes from prior left suboccipital craniectomy and left posterior fossa hematoma evacuation. Residual hemorrhage within the left cerebellar hemisphere, similar to slightly decreased from the prior head CT of 02/18/2022. Unchanged left cerebellar edema. Stable mass effect with partial effacement of the fourth ventricle. No evidence of hydrocephalus.   Persistent edema and trace persistent hemorrhage within the right occipital lobe along the tract from prior attempted ventricular catheter placement.   Stable chronic small vessel ischemic changes within the cerebral white matter.   Paranasal sinus disease, as described."  MR BRAIN 02/12/22: " IMPRESSION: 1. Left suboccipital craniectomy for hematoma evacuation. Roughly 13 mL residual left cerebellar blood tracking across midline to the right. Cerebellar edema with partial effacement of the 4th ventricle. But basilar cistern patency is adequate, with no ventriculomegaly.   2. Sequelae of attempted EVD placement across the splenium of the corpus callosum. Trace intraventricular and subarachnoid hemorrhage.   3. Background mild to moderate nonspecific cerebral white matter signal changes.   4. 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."  Please refer to chart for full details    COGNITION: Overall cognitive status: Within functional limits for tasks assessed   SENSATION: WFL BLE   COORDINATION: Impaired LUE WFL BLE  EDEMA:  None observed at  present    POSTURE: rounded shoulders, forward head, increased thoracic kyphosis, and flexed trunk   LOWER EXTREMITY MMT:    Grossly 4+/5 BLE, greatest deficits in hip musculature    TRANSFERS: Assistive device utilized:  performs STS hands free to RW upon standing   Sit to stand: Modified independence - grabs on to RW in standing Stand to sit: Complete Independence  RAMP:  Level of Assistance: Modified independence per report  GAIT: Gait pattern: use of RW, crouched posture with increased BUE weightbearing on RW, decreased bilat hip ext Distance walked: 10 meters  Level of assistance: CGA   FUNCTIONAL TESTS:    6MWT - deferred to next 1-2 visits  5xSTS: 14.89 sec hands-free  BERG: initiated on this date (see flow chart), will need to complete next 1-2 visits. Unable to complete due to time limitation, pt did finish the following which were scored STS: 4 Standing unsupported: 3 Sitting with back unsupported: 4 Stand to Sit: 4 Transfers: 3 Standing with EC unsupported: 3 Standing reach forward: 3 Standing turn to look behind: 4 Turn 360 deg: 3 Standing one foot in front: 0   10MWT: 0.82 m/s some L knee pain. Pt reports he can feel his feet trying to cross over. Pt with crouched posture with walking, decreased bilat hip ext, increased UE weightbearing   PATIENT SURVEYS:  OT CVA FOTO only - not completed for PT   TODAY'S TREATMENT: DATE: 01/30/2023     Neuro:   Dynamic high knee marching- Attempted on airex pad in // bars- very difficult-switched to floor surface- patient able to perform 20 reps - unsteady yet minimal reaching for UE support)   Step tap in // bars- onto 6" block- Initially with BUE support then progressed to No UE support x 20 reps each LE (very unsteady- veering to left)   Forward step over objects on floor in // bars- progressing from 1/2" diameter PVC to 1 1/2 in PVC to orange hurdle- down and back x 8- Unable to walk reciprocal without UE  support - using step to gait  Side step over objects on floor in //  bars- progressing from 1/2" diameter PVC to 1 1/2 in PVC to orange hurdle- down and back x 8-   Ambulation in gym/ hallway- using RW- CGA-  300 feet with RW - Short reciprocal steps- HR= 93 bpm SpO2 = 96%  A/P sway in // bars - forward wt. Shift toward toes and back on heels x 20 reps each    PATIENT EDUCATION: Education details: exam findings, plan, testing Person educated: Patient and Spouse Education method: Explanation Education comprehension: verbalized understanding  HOME EXERCISE PROGRAM: To be initiated next 1-2 visits   GOALS: Goals reviewed with patient? Yes  SHORT TERM GOALS: Target date: 02/20/2023  Patient will be independent in home exercise program to improve strength/mobility for better functional independence with ADLs. Baseline: to be intiated Goal status: INITIAL    LONG TERM GOALS: Target date: 04/03/2023   Patient will complete five times sit to stand test in < 10 seconds indicating an increased LE strength and improved balance. Baseline: 14.89 sec Goal status: INITIAL  2.  Patient will increase 10 meter walk test to >1.62ms as to improve gait speed for better community ambulation and to reduce fall risk. Baseline:  0.82 m/s Goal status: INITIAL  3.  Patient will increase Berg Balance score by > 6 points to demonstrate decreased fall risk during functional activities. Baseline: 39/56 Goal status: INITIAL  4.  Patient will increase six minute walk test distance to >1000 for progression to community ambulator and improve gait ability Baseline: 795' Goal status: INITIAL  5.  Patient will demonstrate improved function as evidenced by a score of 62 on FOTO measure for full participation in activities at home and in the community. Baseline: 59 Goal status: INITIAL    ASSESSMENT:  CLINICAL IMPRESSION:  Patient responded well to treatment continuing to focus on plan of care  including improving balance. He was challenged with all standing activities - demonstrating unsteadiness overall initially yet improving with VC and practice. Pt will continue to benefit from skilled therapy to address remaining deficits in order to improve overall QoL and return to PLOF.        OBJECTIVE IMPAIRMENTS: Abnormal gait, decreased activity tolerance, decreased balance, decreased coordination, decreased endurance, decreased knowledge of use of DME, decreased mobility, difficulty walking, decreased strength, impaired UE functional use, improper body mechanics, postural dysfunction, and pain.   ACTIVITY LIMITATIONS: carrying, lifting, bending, standing, squatting, stairs, transfers, and locomotion level  PARTICIPATION LIMITATIONS: meal prep, cleaning, laundry, driving, shopping, community activity, and yard work  PERSONAL FACTORS: Age, Past/current experiences, Time since onset of injury/illness/exacerbation, and 3+ comorbidities: PMH significant for MDS, chronic a-fib, HTN, DMII, Presence of Watchman Left atrial appendage closure device, anemia, arthritis, hx of hiatal hernia, hx craniotomy 02/10/22, L atrial appendage occlusion 07/26/22, TEE without cardioversion 07/26/2022  are also affecting patient's functional outcome.   REHAB POTENTIAL: Good  CLINICAL DECISION MAKING: Evolving/moderate complexity  EVALUATION COMPLEXITY: Moderate  PLAN:  PT FREQUENCY: 2x/week  PT DURATION: 12 weeks  PLANNED INTERVENTIONS: Therapeutic exercises, Therapeutic activity, Neuromuscular re-education, Balance training, Gait training, Patient/Family education, Self Care, Joint mobilization, Stair training, Vestibular training, Canalith repositioning, Visual/preceptual remediation/compensation, Orthotic/Fit training, DME instructions, Electrical stimulation, Wheelchair mobility training, Spinal mobilization, Cryotherapy, Moist heat, Splintting, Taping, Ultrasound, Manual therapy, and Re-evaluation  PLAN  FOR NEXT SESSION:   Continue with strengthening exercises and balance related tasks  JOllen Bowl PT Physical Therapist - [facility] 02/04/23, 12:30 AM

## 2023-01-31 NOTE — Telephone Encounter (Signed)
Called patient, spoke with wife. Advised that I contacted [facility]- they were unable to find him in their system. Patient wife stated their office was unorganized and they knew that's where they went. However, unable to have information provided.   Wife states that ECHO and PET are scheduled (although patient is still having a hard time with the process) he will do testing. She states however, they would like to hold off on any statin medications at this time and would discuss at the follow up visit. Wife request that patient have time to process the testing first, and the results of that once completed, and then discuss starting medications.   I advised I would route to MD.   Thanks!

## 2023-02-03 NOTE — Therapy (Signed)
OUTPATIENT OCCUPATIONAL THERAPY NEURO TREATMENT  Patient Name: Vernon [name] MRN: [id number] DOB:09-11-1946, 77 y.o., male Today's Date: 02/03/2023  REFERRING PROVIDER:  Harrel Lemon, MD  END OF SESSION:  OT End of Session - 02/03/23 2212     Visit Number 8    Number of Visits 24    Date for OT Re-Evaluation 04/01/23    Authorization Time Period Progress reporting period starting 01/07/2023    OT Start Time 1431    OT Stop Time 1515    OT Time Calculation (min) 44 min    Equipment Utilized During Treatment transport chair    Activity Tolerance Patient tolerated treatment well    Behavior During Therapy WFL for tasks assessed/performed             Past Medical History:  Diagnosis Date   Anemia    Aortic stenosis    Arthritis    Complication of anesthesia    hard time getting bp up after knee replacement   Coronary artery disease    Diabetes mellitus without complication (HCC)    GERD (gastroesophageal reflux disease)    occ tums prn   History of hiatal hernia    Hypertension    MDS (myelodysplastic syndrome) ([place])    Presence of Watchman left atrial appendage closure device 07/26/2022   58m Watchman FLX with Dr. 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;  Surgeon: EKristeen Miss MD;  [facility]  Service: Neurosurgery;  Laterality: N/A;   JOINT REPLACEMENT Right 2010   LEFT ATRIAL APPENDAGE OCCLUSION N/A 07/26/2022   Procedure: LEFT ATRIAL APPENDAGE OCCLUSION;  Surgeon: LVickie Epley MD;  [facility];  Service: Cardiovascular;  Laterality: N/A;   SHOULDER ARTHROSCOPY WITH ROTATOR CUFF REPAIR AND OPEN BICEPS TENODESIS Right 11/09/2019   Procedure: RIGHT SHOULDER ARTHROSCOPY WITH SUBSCAPULARIS REPAIR, SUBACROMIAL DECOMPRESSION,MINI OPEN ROTATOR CUFF REPAIR;  Surgeon: PLeim Fabry MD;   Location: [facility];  Service: Orthopedics;  Laterality: Right;   TEE WITHOUT CARDIOVERSION N/A 07/26/2022   Procedure: TRANSESOPHAGEAL ECHOCARDIOGRAM (TEE);  Surgeon: LVickie Epley MD;  [facility];  Service: Cardiovascular;  Laterality: N/A;   Patient Active Problem List   Diagnosis Date Noted   Urinary tract infection without hematuria 12/19/2022   Painless jaundice 12/18/2022   Elevated LFTs 12/18/2022   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 07/26/2022   Presence of Watchman left atrial appendage closure device 07/26/2022   Proteinuria, unspecified 05/23/2022   Simple renal cyst 05/23/2022   Long term current use of anticoagulant 04/04/2022   Rotator cuff syndrome 04/04/2022   Exposure to potentially hazardous substance 04/04/2022   Gout 04/04/2022   Osteoarthritis 04/04/2022   Osteoporosis 04/04/2022   Other specified diseases of hair and hair follicles 00000000  Pain in joint, lower leg 04/04/2022   Problem related to unspecified psychosocial circumstances 04/04/2022   General medical examination for administrative purposes 04/04/2022   Stage 3b chronic kidney disease ([place]    Dysphagia, post-stroke    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 hemorrhage of brain ([facility] 02/26/2022   Cough [id number]  Acute metabolic encephalopathy [id number]  Obstructive hydrocephalus ([facility] 02/19/2022   Dyslipidemia 02/19/2022   Dysphagia 02/19/2022   AKI (acute kidney injury) ([place]    Anemia    Pleural effusion on right  Respiratory failure requiring intubation (HCC)    Cerebral edema (HCC)    Chronic atrial fibrillation (HCC)    Controlled type 2 diabetes mellitus with hyperglycemia, without long-term current use of insulin (HCC)    ICH (intracerebral hemorrhage) ([place]) 02/10/2022   Intracranial hemorrhage (HCC)    Anticoagulated    Moderate tricuspid regurgitation 07/26/2021   Moderate aortic valve stenosis 03/08/2020    MDS (myelodysplastic syndrome) ([place]) 02/18/2020   Macrocytic anemia 01/13/2020   Spleen enlargement 01/13/2020   Bilateral carotid artery stenosis 07/14/2018   Atrial fibrillation, chronic ([place]) 07/14/2018   Type 2 diabetes with nephropathy ([place]) 02/14/2016   Essential hypertension 08/16/2014   Hyperlipemia, mixed 08/16/2014   Renal insufficiency 08/16/2014    ONSET DATE: 02/10/2022  REFERRING DIAG:  CVA  THERAPY DIAG:  Coordination Muscle weakness (generalized)  Other lack of coordination  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 Pt accompanied by: significant other  PERTINENT HISTORY: Pt. is a 18 y. 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 Pt. received outpt. OT services following the CVA for Left sided weakness, and incoordination. Pt. was recently hospitalized from 1/22-1/25/24 for Obstructive Jaundice. PMHx includes: MDS, Chronic A-Fib, Essential HTN, Type II DM, Dyslipidemia, Presence of Watchman Left atrial appendage closure device, GERD  PRECAUTIONS: None  WEIGHT BEARING RESTRICTIONS: No  PAIN:  Are you having pain? No  FALLS: Has patient fallen in last 6 months? No  LIVING ENVIRONMENT: Lives with: lives with their spouse Lives in: House/apartment one storey, can fully live on the 1st floor. Stairs: Ramped entrance  Has following equipment at home: 4 wheeled walker, w/c-manual, grab bars, shower chair, HH shower head, toilet riser with arms.  PLOF: Independent  PATIENT GOALS: To improve arm and hand function   OBJECTIVE:   HAND DOMINANCE: Right  ADLs: Transfers/ambulation related to ADLs: Eating: Difficulty with motor control control Grooming:  Independent UB Dressing:  Diffiulty  buttoning when no looking directly at the buttons.  LB Dressing: Independent donning pants, socks, and slide on shoes. Toileting: Independent Bathing: Independent, wife assists with towel. Tub Shower transfers: Modified Independence  in the shower, CGA transfers out of shoer.   IADLs: Shopping:  Pt.'s wife performs, occasionally uses grocery store scooters Light housekeeping: Wife performs housekeeping tasks. Meal Prep: Prepares light snacks, and light meal prep Community mobility: Relies on family and friends Medication management: Independent Financial management: Does  with wife; No changes in how they are doing them Handwriting: 100% legible for name: Wife has noticed some changes with writing  Hobbies: Plays the guitar, workshop, music shop.  MOBILITY STATUS: Needs Assist: uses a rollator  POSTURE COMMENTS:  No Significant postural limitations Sitting balance: WFL  ACTIVITY TOLERANCE: Activity tolerance:  Limited  FUNCTIONAL OUTCOME MEASURES:  FOTO: 59; TR score: 60  UPPER EXTREMITY ROM:    Active ROM Right Eval  Left Eval [facility]  Shoulder flexion 106   Shoulder abduction 85   Shoulder adduction    Shoulder extension    Shoulder internal rotation    Shoulder external rotation    Elbow flexion WFL   Elbow extension WFL   Wrist flexion    Wrist extension WFL   Wrist ulnar deviation    Wrist radial deviation    Wrist pronation    Wrist supination WFL   (  Blank rows = not tested)  Digit flexion to the [facility]:  2nd: 0cm, 3rd: 1.5cm, 4th: 2.5cm, 5th: 0cm   UPPER EXTREMITY MMT:     MMT Right eval Left eval  Shoulder flexion 3- 4  Shoulder abduction 3- 4  Shoulder adduction    Shoulder extension    Shoulder internal rotation    Shoulder external rotation    Middle trapezius    Lower trapezius    Elbow flexion 4 4  Elbow extension 4 4  Wrist flexion    Wrist extension 4 4  Wrist ulnar deviation    Wrist radial deviation    Wrist pronation    Wrist  supination    (Blank rows = not tested)  HAND FUNCTION: Grip strength: N/A secondary to arthritis, and 3rd digit trigger finger    Lateral Pinch: R: 23#, L: 20#  3 Pt. Pinch: R: 22#, L: 21#  COORDINATION: 9 Hole Peg test: Right: 25 sec; Left: 2 min. & 34  sec  SENSATION: WFL Light touch: WFL Stereognosis: WFL  EDEMA:   N/A  MUSCLE TONE:  Intact  COGNITION: Overall cognitive status: Within functional limits for tasks assessed  VISION: Subjective report: Pt. reports double vision initially at the onset of the CVA with difficulty scanning to left when reading. Pt. Reports this has improved, and the double vision has resolved.   PERCEPTION: WFL  PRAXIS: Impaired motor control, and coordination   TODAY'S TREATMENT:                                                                                                                              Neuromuscular re-education: Practiced L hand FMC/dexterity skills working with [place] discs.  Pt practiced placing, removing, and flipping discs using L hand, working on speed and moving the discs without resting hand on table top to further challenge coordination throughout the LUE.  Practiced translatory skills working to pick up, store, and move glass stones from palm to fingertips in prep for discarding.  Practiced picking stones up flat side down and up from table top, and flipping stones over.   PATIENT EDUCATION: Education details:  LUE Nature conservation officer Person educated: pt Education method: Explanation, Demonstration, and Tactile cues Education comprehension: verbalized understanding, returned demonstration, verbal cues required, tactile cues required, and needs further education  Home Exercise Program:  Continue to assess and provide as indicated  GOALS: Goals reviewed with patient?  SHORT TERM GOALS: Target date: 02/18/2023    Pt. will demonstrate independence with HEP for LUE functioning Baseline: Eval: No  current HEP Goal status: INITIAL  LONG TERM GOALS: Target date:  04/01/2023    Pt. will improve FOTO score by 1 point for patient perceived improvement with assessment specific ADLs, and IADLs.  Baseline: Eval: FOTO score: 59; TR score: 60 Goal status: INITIAL  2.  Pt. will independently modulate the accuracy of holding items without overgripping when reaching for items 50% of the time Baseline:  Eval: Pt. is consistently overpowering, and over gripping items when grasping them with the left hand Goal status: INITIAL  3.  Pt. will improve left hand [facility] skills by 10 sec. to improve left hand grasp on objects.  Baseline: Eval: R: 25 sec. L: 2 min. & 34 sec.  Goal status: INITIAL  4.  Pt. Will utilize compensatory strategies for left hand function motor coordination 75% during ADLs, and IADL tasks. Baseline: Eval: Minimal use of compensatory strategies during ADLs, and IADLs. Goal status: INITIAL  5.  Pt. Will use compensatory strategies for left hand function 75% of the time when utilizing multiple body systems, dual tasking, and using the hand in a variety of different contexts. Baseline: Eval: Pt. Consistently has difficulty using his left hand to complete tasks when he is not looking directly at his hand, and has more difficulty using his left hand when standing, or attempting to walk with the walker.  Goal status: INITIAL  ASSESSMENT:   CLINICAL IMPRESSION: Pt reports the he is consistently practicing his bass at home for about 5 min at a time, as he states that longer than this causes extreme frustration d/t lack of coordination, and he doesn't want to associate his bass with anything negative.  OT provided positive reinforcement for his efforts and encouraged continued daily practice.  Pt is improving with his ability to pick up small items from table top without a non-skid surface, but translatory movements are challenging and pt continues to require extra time when placing objects at a  target.  Improved accuracy with distal mobility is obtained with proximal stabilization.  Pt continues to present with decreased motor control when attempting [facility] tasks at an elevated plane.  Pt continues to benefit from OT services to improve Left hand function, improve LUE ataxia, use of graded pressure with grasping, and [facility] skills in order to be able to use his hand when challenged in multiple contexts, and with dual tasking. Pt will benefit from education about compensatory strategies for left hand function during ADLs, and IADLs.    PERFORMANCE DEFICITS: in functional skills including ADLs, IADLs, coordination, dexterity, ROM, strength, Fine motor control, and Gross motor control, cognitive skills including attention, memory, problem solving, and safety awareness, and psychosocial skills including coping strategies, environmental adaptation, habits, and routines and behaviors.   IMPAIRMENTS: are limiting patient from ADLs, IADLs, play, leisure, and social participation.   CO-MORBIDITIES: may have co-morbidities  that affects occupational performance. Patient will benefit from skilled OT to address above impairments and improve overall function.  MODIFICATION OR ASSISTANCE TO COMPLETE EVALUATION: Min-Moderate modification of tasks or assist with assess necessary to complete an evaluation.  OT OCCUPATIONAL PROFILE AND HISTORY: Detailed assessment: Review of records and additional review of physical, cognitive, psychosocial history related to current functional performance.  CLINICAL DECISION MAKING: Moderate - several treatment options, min-mod task modification necessary  REHAB POTENTIAL: Good  EVALUATION COMPLEXITY: Moderate    PLAN:  OT FREQUENCY: 2x/week  OT DURATION: 12 weeks  PLANNED INTERVENTIONS: self care/ADL training, therapeutic exercise, therapeutic activity, patient/family education, cognitive remediation/compensation, and DME and/or AE instructions, neuromuscular  re-education  RECOMMENDED OTHER SERVICES: OT services.  CONSULTED AND AGREED WITH PLAN OF CARE: Patient and family member/caregiver-wife  PLAN FOR NEXT SESSION: Initiate treatment  Leta Speller, MS, OTR/L   Darleene Cleaver, OT 02/03/2023, 10:13 PM

## 2023-02-04 ENCOUNTER — Ambulatory Visit: Payer: Medicare Other

## 2023-02-04 DIAGNOSIS — I69254 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: Secondary | ICD-10-CM

## 2023-02-04 DIAGNOSIS — R262 Difficulty in walking, not elsewhere classified: Secondary | ICD-10-CM

## 2023-02-04 DIAGNOSIS — M6281 Muscle weakness (generalized): Secondary | ICD-10-CM | POA: Diagnosis not present

## 2023-02-04 DIAGNOSIS — R278 Other lack of coordination: Secondary | ICD-10-CM

## 2023-02-04 DIAGNOSIS — R2681 Unsteadiness on feet: Secondary | ICD-10-CM

## 2023-02-04 NOTE — Therapy (Signed)
OUTPATIENT PHYSICAL THERAPY NEURO TREATMENT   Patient Name: Vernon [name] MRN: [id number] DOB:[birth year], 77 y.o., male Today's Date: 02/04/2023   PCP:   Baxter Hire, MD   REFERRING PROVIDER:   Baxter Hire, MD    END OF SESSION:  PT End of Session - 02/04/23 1004     Visit Number 7    Number of Visits 25    Date for PT Re-Evaluation 04/03/23    Progress Note Due on Visit 10    PT Start Time 1011    PT Stop Time 1055    PT Time Calculation (min) 44 min    Equipment Utilized During Treatment Gait belt    Activity Tolerance Patient tolerated treatment well    Behavior During Therapy WFL for tasks assessed/performed               Past Medical History:  Diagnosis Date   Anemia    Aortic stenosis    Arthritis    Complication of anesthesia    hard time getting bp up after knee replacement   Coronary artery disease    Diabetes mellitus without complication (HCC)    GERD (gastroesophageal reflux disease)    occ tums prn   History of hiatal hernia    Hypertension    MDS (myelodysplastic syndrome) ([place])    Presence of Watchman left atrial appendage closure device 07/26/2022   30m Watchman FLX with Dr. 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;  Surgeon: EKristeen Miss MD;  [facility];  Service: Neurosurgery;  Laterality: N/A;   JOINT REPLACEMENT Right 2010   LEFT ATRIAL APPENDAGE OCCLUSION N/A 07/26/2022   Procedure: LEFT ATRIAL APPENDAGE OCCLUSION;  Surgeon: LVickie Epley MD;  [facility];  Service: Cardiovascular;  Laterality: N/A;   SHOULDER ARTHROSCOPY WITH ROTATOR CUFF REPAIR AND OPEN BICEPS TENODESIS Right 11/09/2019   Procedure: RIGHT SHOULDER ARTHROSCOPY WITH SUBSCAPULARIS REPAIR, SUBACROMIAL DECOMPRESSION,MINI OPEN ROTATOR CUFF REPAIR;  Surgeon: PLeim Fabry MD;   Location: [facility];  Service: Orthopedics;  Laterality: Right;   TEE WITHOUT CARDIOVERSION N/A 07/26/2022   Procedure: TRANSESOPHAGEAL ECHOCARDIOGRAM (TEE);  Surgeon: LVickie Epley MD;  [facility];  Service: Cardiovascular;  Laterality: N/A;   Patient Active Problem List   Diagnosis Date Noted   Urinary tract infection without hematuria 12/19/2022   Painless jaundice 12/18/2022   Elevated LFTs 12/18/2022   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   Obstructive jaundice 12/17/2022   [place]rial fibrillation ([place] 07/26/2022   Presence of Watchman left atrial appendage closure device 07/26/2022   Proteinuria, unspecified 05/23/2022   Simple renal cyst 05/23/2022   Long term current use of anticoagulant 04/04/2022   Rotator cuff syndrome 04/04/2022   Exposure to potentially hazardous substance 04/04/2022   Gout 04/04/2022   Osteoarthritis 04/04/2022   Osteoporosis 04/04/2022   Other specified diseases of hair and hair follicles 00000000  Pain in joint, lower leg 04/04/2022   Problem related to unspecified psychosocial circumstances 04/04/2022   General medical examination for administrative purposes 04/04/2022   Stage 3b chronic kidney disease ([place]    Dysphagia, post-stroke    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 hemorrhage of brain ([facility] 02/26/2022   Cough [id number]  Acute metabolic encephalopathy [id number]  Obstructive hydrocephalus ([place] 02/19/2022   Dyslipidemia 02/19/2022   Dysphagia 02/19/2022   AKI (acute kidney injury) (  HCC)    Anemia    Pleural effusion on right    Respiratory failure requiring intubation (HCC)    Cerebral edema (HCC)    Chronic atrial fibrillation (HCC)    Controlled type 2 diabetes mellitus with hyperglycemia, without long-term current use of insulin (HCC)    ICH (intracerebral hemorrhage) ([place]) 02/10/2022   Intracranial hemorrhage (HCC)    Anticoagulated    Moderate tricuspid regurgitation 07/26/2021   Moderate aortic valve stenosis 03/08/2020    MDS (myelodysplastic syndrome) ([place]) 02/18/2020   Macrocytic anemia 01/13/2020   Spleen enlargement 01/13/2020   Bilateral carotid artery stenosis 07/14/2018   Atrial fibrillation, chronic ([place]) 07/14/2018   Type 2 diabetes with nephropathy ([place]) 02/14/2016   Essential hypertension 08/16/2014   Hyperlipemia, mixed 08/16/2014   Renal insufficiency 08/16/2014    ONSET DATE: 02/10/2022  REFERRING DIAG:  I63.9 (ICD-10-CM) - Cerebral infarction, unspecified  I63.9 (ICD-10-CM) - CVA (cerebral vascular accident) ([place] [place])    THERAPY DIAG:  Muscle weakness (generalized)  Other lack of coordination  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 Adjusted his rollator wheels and he reports he veers a lot less since fixing the wheels.    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 Pt hospitalized 1/22-1/25/24 for obstructive Jaundice. Pt reports it was found he had a UTI with recent hospital stay. He wants to improve his endurance and balance. PMH significant for MDS, chronic a-fib, HTN, DMII, Presence of Watchman Left atrial appendage closure device, anemia, arthritis, hx of hiatal hernia, hx craniotomy 02/10/22, L atrial appendage occlusion 07/26/22, TEE without cardioversion 07/26/2022    PAIN:  Are you having pain? 6/10 in the L knee when walking.  PRECAUTIONS: Fall  WEIGHT BEARING RESTRICTIONS: No  FALLS:  Has patient fallen in last 6 months? No  LIVING ENVIRONMENT: Lives with: lives with their family and lives with their spouse Lives in:  Stairs:  Has following equipment at home: Environmental consultant - 4 wheeled  PLOF: Independent  PATIENT GOALS: improve coordination, endurance and balance  OBJECTIVE:   DIAGNOSTIC FINDINGS:  02/20/22 CT HEAD: "IMPRESSION: No evidence of interval acute intracranial abnormality.   Continued evolution of postoperative changes from prior left suboccipital craniectomy and left posterior fossa hematoma evacuation. Residual hemorrhage within the left cerebellar hemisphere, similar to slightly decreased from the prior head CT of 02/18/2022. Unchanged left cerebellar edema. Stable mass effect with partial effacement of the fourth ventricle. No evidence of hydrocephalus.   Persistent edema and trace persistent hemorrhage within the right occipital lobe along the tract from prior attempted ventricular catheter placement.   Stable chronic small vessel ischemic changes within the cerebral white matter.   Paranasal sinus disease, as described."  MR BRAIN 02/12/22: " IMPRESSION: 1. Left suboccipital craniectomy for hematoma evacuation. Roughly 13 mL residual left cerebellar blood tracking across midline to the right. Cerebellar edema with partial effacement of the 4th ventricle. But basilar cistern patency is adequate, with no ventriculomegaly.   2. Sequelae of attempted EVD placement across the splenium of the corpus callosum. Trace intraventricular and subarachnoid hemorrhage.   3. Background mild to moderate nonspecific cerebral white matter signal changes.   4. 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."  Please refer to chart for full details    COGNITION: Overall cognitive status: Within functional limits for tasks assessed   SENSATION: WFL BLE   COORDINATION: Impaired LUE WFL BLE  EDEMA:  None  observed at present    POSTURE: rounded shoulders, forward head, increased thoracic kyphosis, and flexed trunk   LOWER EXTREMITY MMT:    Grossly 4+/5 BLE, greatest deficits in hip musculature    TRANSFERS: Assistive device utilized:  performs STS hands free to RW upon standing   Sit to stand: Modified independence - grabs on to RW in standing Stand to sit: Complete Independence  RAMP:  Level of Assistance: Modified independence per report  GAIT: Gait pattern: use of RW, crouched posture with increased BUE weightbearing on RW, decreased bilat hip ext Distance walked: 10 meters  Level of assistance: CGA   FUNCTIONAL TESTS:    6MWT - deferred to next 1-2 visits  5xSTS: 14.89 sec hands-free  BERG: initiated on this date (see flow chart), will need to complete next 1-2 visits. Unable to complete due to time limitation, pt did finish the following which were scored STS: 4 Standing unsupported: 3 Sitting with back unsupported: 4 Stand to Sit: 4 Transfers: 3 Standing with EC unsupported: 3 Standing reach forward: 3 Standing turn to look behind: 4 Turn 360 deg: 3 Standing one foot in front: 0   10MWT: 0.82 m/s some L knee pain. Pt reports he can feel his feet trying to cross over. Pt with crouched posture with walking, decreased bilat hip ext, increased UE weightbearing   PATIENT SURVEYS:  OT CVA FOTO only - not completed for PT   TODAY'S TREATMENT: DATE: 02/04/2023     Neuro Re-Ed:   Gait with RW. 2x300' bouts CGA with VC's for improving step through step lengths. VC's for visual cues on stationary target to decrease veering in hallway. Seated rest b/t reps. 2# AW's on second set. Improved upright posture and straight gait on second bout. Min VC's for improving foot clearance with good carryover.   Dynamic high knee marches on flat, level surface: x20/LE without UE support. Requires constant CGA with R/L sway needing frequent SUE support in // bars to recover.    Alternating foot taps onto 6" step for SLS tasks: 2x12/LE, CGA  Forward and lateral hurdle (x2) step overs  in // bars: x6/plane. Frequent bouts of stepping strategy to correct and needing to stop in front of hurdle instead of performing reciprocally. Does complete grossly without UE support throughout.   Airex pad standing feet apart: 3 minutes total. Initially unsteady with AP ankle righting. As time passes pt generally able to stand without ankle strategy.   Backwards gait in // bars: x6 lengths for post stepping strategy correct and hip extension strengthening. CGA     PATIENT EDUCATION: Education details: exam findings, plan, testing Person educated: Patient and Spouse Education method: Explanation Education comprehension: verbalized understanding  HOME EXERCISE PROGRAM: To be initiated next 1-2 visits   GOALS: Goals reviewed with patient? Yes  SHORT TERM GOALS: Target date: 02/20/2023  Patient will be independent in home exercise program to improve strength/mobility for better functional independence with ADLs. Baseline: to be intiated Goal status: INITIAL    LONG TERM GOALS: Target date: 04/03/2023   Patient will complete five times sit to stand test in < 10 seconds indicating an increased LE strength and improved balance. Baseline: 14.89 sec Goal status: INITIAL  2.  Patient will increase 10 meter walk test to >1.75ms as to improve gait speed for better community ambulation and to reduce fall risk. Baseline:  0.82 m/s Goal status: INITIAL  3.  Patient will increase Berg Balance score by > 6 points to demonstrate decreased fall risk during functional activities. Baseline: 39/56 Goal status: INITIAL  4.  Patient will increase six minute walk test distance to >1000 for progression to community ambulator and improve gait ability Baseline: 795' Goal status: INITIAL  5.  Patient will demonstrate improved function as evidenced by a score of 62 on FOTO measure for  full participation in activities at home and in the community. Baseline: 59 Goal status: INITIAL    ASSESSMENT:  CLINICAL IMPRESSION: Continuing PT POC with focus on dynamic balance. Focus os session on ankle/hip and stepping strategy as pt generally very unsteady due to L hemi weakness and proprioceptive impairments. Use of visual targets, unstable surfaces, and mirror to improve proprioceptive feedback with success as pt responds well with improved form/technique using his vision and targets. Pt does require regular seated rest breaks due L knee pain and general LE weakness and deconditioning. Pt will continue to benefit from skilled therapy to address remaining deficits in order to improve overall QoL and return to PLOF.        OBJECTIVE IMPAIRMENTS: Abnormal gait, decreased activity tolerance, decreased balance, decreased coordination, decreased endurance, decreased knowledge of use of DME, decreased mobility, difficulty walking, decreased strength, impaired UE functional use, improper body mechanics, postural dysfunction, and pain.   ACTIVITY LIMITATIONS: carrying, lifting, bending, standing, squatting, stairs, transfers, and locomotion level  PARTICIPATION LIMITATIONS: meal prep, cleaning, laundry, driving, shopping, community activity, and yard work  PERSONAL FACTORS: Age, Past/current experiences, Time since onset of injury/illness/exacerbation, and 3+ comorbidities: PMH significant for MDS, chronic a-fib, HTN, DMII, Presence of Watchman Left atrial appendage closure device, anemia, arthritis, hx of hiatal hernia, hx craniotomy 02/10/22, L atrial appendage occlusion 07/26/22, TEE without cardioversion 07/26/2022  are also affecting patient's functional outcome.   REHAB POTENTIAL: Good  CLINICAL DECISION MAKING: Evolving/moderate complexity  EVALUATION COMPLEXITY: Moderate  PLAN:  PT FREQUENCY: 2x/week  PT DURATION: 12 weeks  PLANNED INTERVENTIONS: Therapeutic exercises,  Therapeutic activity, Neuromuscular re-education, Balance training, Gait training, Patient/Family education, Self Care, Joint mobilization, Stair training, Vestibular training, Canalith repositioning, Visual/preceptual remediation/compensation, Orthotic/Fit training, DME instructions, Electrical stimulation, Wheelchair mobility training, Spinal  mobilization, Cryotherapy, Moist heat, Splintting, Taping, Ultrasound, Manual therapy, and Re-evaluation  PLAN FOR NEXT SESSION:   Continue with strengthening exercises and balance related tasks  Salem Caster. Fairly IV, PT, DPT Physical Therapist- [facility]  02/04/23, 10:58 AM

## 2023-02-04 NOTE — Therapy (Signed)
OUTPATIENT OCCUPATIONAL THERAPY NEURO TREATMENT  Patient Name: Vernon [name] MRN: [id number] DOB:1946/07/04, 77 y.o., male Today's Date: 02/04/2023  REFERRING PROVIDER:  Harrel Lemon, MD  END OF SESSION:  OT End of Session - 02/04/23 1108     Visit Number 9    Number of Visits 24    Date for OT Re-Evaluation 04/01/23    Authorization Time Period Progress reporting period starting 01/07/2023    OT Start Time 33    OT Stop Time 1145    OT Time Calculation (min) 45 min    Equipment Utilized During Treatment transport chair    Activity Tolerance Patient tolerated treatment well    Behavior During Therapy WFL for tasks assessed/performed             Past Medical History:  Diagnosis Date   Anemia    Aortic stenosis    Arthritis    Complication of anesthesia    hard time getting bp up after knee replacement   Coronary artery disease    Diabetes mellitus without complication (HCC)    GERD (gastroesophageal reflux disease)    occ tums prn   History of hiatal hernia    Hypertension    MDS (myelodysplastic syndrome) ([place])    Presence of Watchman left atrial appendage closure device 07/26/2022   32m Watchman FLX with Dr. 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;  Surgeon: EKristeen Miss MD;  [facility]  Service: Neurosurgery;  Laterality: N/A;   JOINT REPLACEMENT Right 2010   LEFT ATRIAL APPENDAGE OCCLUSION N/A 07/26/2022   Procedure: LEFT ATRIAL APPENDAGE OCCLUSION;  Surgeon: LVickie Epley MD;  [facility];  Service: Cardiovascular;  Laterality: N/A;   SHOULDER ARTHROSCOPY WITH ROTATOR CUFF REPAIR AND OPEN BICEPS TENODESIS Right 11/09/2019   Procedure: RIGHT SHOULDER ARTHROSCOPY WITH SUBSCAPULARIS REPAIR, SUBACROMIAL DECOMPRESSION,MINI OPEN ROTATOR CUFF REPAIR;  Surgeon: PLeim Fabry MD;   Location: [facility];  Service: Orthopedics;  Laterality: Right;   TEE WITHOUT CARDIOVERSION N/A 07/26/2022   Procedure: TRANSESOPHAGEAL ECHOCARDIOGRAM (TEE);  Surgeon: LVickie Epley MD;  [facility];  Service: Cardiovascular;  Laterality: N/A;   Patient Active Problem List   Diagnosis Date Noted   Urinary tract infection without hematuria 12/19/2022   Painless jaundice 12/18/2022   Elevated LFTs 12/18/2022   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   Obstructive jaundice 12/17/2022   Atrial fibrillation ([facility] 07/26/2022   Presence of Watchman left atrial appendage closure device 07/26/2022   Proteinuria, unspecified 05/23/2022   Simple renal cyst 05/23/2022   Long term current use of anticoagulant 04/04/2022   Rotator cuff syndrome 04/04/2022   Exposure to potentially hazardous substance 04/04/2022   Gout 04/04/2022   Osteoarthritis 04/04/2022   Osteoporosis 04/04/2022   Other specified diseases of hair and hair follicles 00000000  Pain in joint, lower leg 04/04/2022   Problem related to unspecified psychosocial circumstances 04/04/2022   General medical examination for administrative purposes 04/04/2022   Stage 3b chronic kidney disease ([facility]    Dysphagia, post-stroke    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 hemorrhage of brain ([place] 02/26/2022   Cough [id number]  Acute metabolic encephalopathy [id number]  Obstructive hydrocephalus ([place] 02/19/2022   Dyslipidemia 02/19/2022   Dysphagia 02/19/2022   AKI (acute kidney injury) ([place]    Anemia    Pleural effusion on right  Respiratory failure requiring intubation (HCC)    Cerebral edema (HCC)    Chronic atrial fibrillation (HCC)    Controlled type 2 diabetes mellitus with hyperglycemia, without long-term current use of insulin (HCC)    ICH (intracerebral hemorrhage) ([place]) 02/10/2022   Intracranial hemorrhage (HCC)    Anticoagulated    Moderate tricuspid regurgitation 07/26/2021   Moderate aortic valve stenosis 03/08/2020    MDS (myelodysplastic syndrome) ([place]) 02/18/2020   Macrocytic anemia 01/13/2020   Spleen enlargement 01/13/2020   Bilateral carotid artery stenosis 07/14/2018   Atrial fibrillation, chronic ([place]) 07/14/2018   Type 2 diabetes with nephropathy ([place]) 02/14/2016   Essential hypertension 08/16/2014   Hyperlipemia, mixed 08/16/2014   Renal insufficiency 08/16/2014    ONSET DATE: 02/10/2022  REFERRING DIAG:  CVA  THERAPY DIAG:  Coordination Muscle weakness (generalized)  Other lack of coordination  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  Pt accompanied by: significant other  PERTINENT HISTORY: Pt. is a 26 y. 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 Pt. received outpt. OT services following the CVA for Left sided weakness, and incoordination. Pt. was recently hospitalized from 1/22-1/25/24 for Obstructive Jaundice. PMHx includes: MDS, Chronic A-Fib, Essential HTN, Type II DM, Dyslipidemia, Presence of Watchman Left atrial appendage closure device, GERD  PRECAUTIONS: None  WEIGHT BEARING RESTRICTIONS: No  PAIN:  Are you having pain? No  FALLS: Has patient fallen in last 6 months? No  LIVING ENVIRONMENT: Lives with: lives with their spouse Lives in: House/apartment one storey, can fully live on the 1st floor. Stairs: Ramped entrance  Has following equipment at home: 4 wheeled walker, w/c-manual, grab bars, shower chair, HH shower head, toilet riser with arms.  PLOF: Independent  PATIENT GOALS: To improve arm and hand function   OBJECTIVE:   HAND DOMINANCE:  Right  ADLs: Transfers/ambulation related to ADLs: Eating: Difficulty with motor control control Grooming:  Independent UB Dressing:  Diffiulty buttoning when no looking directly at the buttons.  LB Dressing: Independent donning pants, socks, and slide on shoes. Toileting: Independent Bathing: Independent, wife assists with towel. Tub Shower transfers: Modified Independence  in the shower, CGA transfers out of shoer.   IADLs: Shopping:  Pt.'s wife performs, occasionally uses grocery store scooters Light housekeeping: Wife performs housekeeping tasks. Meal Prep: Prepares light snacks, and light meal prep Community mobility: Relies on family and friends Medication management: Independent Financial management: Does  with wife; No changes in how they are doing them Handwriting: 100% legible for name: Wife has noticed some changes with writing  Hobbies: Plays the guitar, workshop, music shop.  MOBILITY STATUS: Needs Assist: uses a rollator  POSTURE COMMENTS:  No Significant postural limitations Sitting balance: WFL  ACTIVITY TOLERANCE: Activity tolerance:  Limited  FUNCTIONAL OUTCOME MEASURES:  FOTO: 59; TR score: 60  UPPER EXTREMITY ROM:    Active ROM Right Eval  Left Eval [facility]  Shoulder flexion 106   Shoulder abduction 85   Shoulder adduction    Shoulder extension    Shoulder internal rotation    Shoulder external rotation    Elbow flexion WFL   Elbow extension WFL   Wrist flexion  Wrist extension WFL   Wrist ulnar deviation    Wrist radial deviation    Wrist pronation    Wrist supination WFL   (Blank rows = not tested)  Digit flexion to the [facility]:  2nd: 0cm, 3rd: 1.5cm, 4th: 2.5cm, 5th: 0cm   UPPER EXTREMITY MMT:     MMT Right eval Left eval  Shoulder flexion 3- 4  Shoulder abduction 3- 4  Shoulder adduction    Shoulder extension    Shoulder internal rotation    Shoulder external rotation    Middle trapezius    Lower trapezius    Elbow flexion 4  4  Elbow extension 4 4  Wrist flexion    Wrist extension 4 4  Wrist ulnar deviation    Wrist radial deviation    Wrist pronation    Wrist supination    (Blank rows = not tested)  HAND FUNCTION: Grip strength: N/A secondary to arthritis, and 3rd digit trigger finger    Lateral Pinch: R: 23#, L: 20#  3 Pt. Pinch: R: 22#, L: 21#  COORDINATION: 9 Hole Peg test: Right: 25 sec; Left: 2 min. & 34  sec  SENSATION: WFL Light touch: WFL Stereognosis: WFL  EDEMA:   N/A  MUSCLE TONE:  Intact  COGNITION: Overall cognitive status: Within functional limits for tasks assessed  VISION: Subjective report: Pt. reports double vision initially at the onset of the CVA with difficulty scanning to left when reading. Pt. Reports this has improved, and the double vision has resolved.   PERCEPTION: WFL  PRAXIS: Impaired motor control, and coordination   TODAY'S TREATMENT:                                                                                                                              Neuromuscular re-education: Facilitated L hand FMC/GMC skills working to pick up and place ball pegs into pegboard.  Began placing pegs with pegboard flat on table top, then transitioned to pegboard on an incline to further challenge [place].  Pt required frequent rest breaks to complete the perimeter of the board with each trial.  Pt removed pegs from board using L hand, placing pegs into a container which OT held at shoulder height to facilitate forward and lateral reaching patterns.  Pt worked also with grooved pegs, placing pegs into pegboard with support of L forearm on table top to increase distal stability.  Pt required frequent rest breaks as coordination worsens with fatigue.    PATIENT EDUCATION: Education details:  LUE Nature conservation officer Person educated: pt Education method: Explanation, Demonstration, and Tactile cues Education comprehension: verbalized understanding, returned  demonstration, verbal cues required, tactile cues required, and needs further education  Home Exercise Program:  Continue to assess and provide as indicated  GOALS: Goals reviewed with patient?  SHORT TERM GOALS: Target date: 02/18/2023    Pt. will demonstrate independence with HEP for LUE functioning Baseline: Eval: No current HEP Goal status: INITIAL  LONG TERM GOALS: Target date:  04/01/2023    Pt. will improve FOTO score by 1 point for patient perceived improvement with assessment specific ADLs, and IADLs.  Baseline: Eval: FOTO score: 59; TR score: 60 Goal status: INITIAL  2.  Pt. will independently modulate the accuracy of holding items without overgripping when reaching for items 50% of the time Baseline: Eval: Pt. is consistently overpowering, and over gripping items when grasping them with the left hand Goal status: INITIAL  3.  Pt. will improve left hand [facility] skills by 10 sec. to improve left hand grasp on objects.  Baseline: Eval: R: 25 sec. L: 2 min. & 34 sec.  Goal status: INITIAL  4.  Pt. Will utilize compensatory strategies for left hand function motor coordination 75% during ADLs, and IADL tasks. Baseline: Eval: Minimal use of compensatory strategies during ADLs, and IADLs. Goal status: INITIAL  5.  Pt. Will use compensatory strategies for left hand function 75% of the time when utilizing multiple body systems, dual tasking, and using the hand in a variety of different contexts. Baseline: Eval: Pt. Consistently has difficulty using his left hand to complete tasks when he is not looking directly at his hand, and has more difficulty using his left hand when standing, or attempting to walk with the walker.  Goal status: INITIAL  ASSESSMENT:   CLINICAL IMPRESSION: Pt continues to consistently practice his bass at home for about 5 min at a time and states that he can see that he's improving with his ability to use less force when pressing the strings on his guitar, or  when picking up a peg during an OT activity.  Pt reports he still can't control his bigger arm movements well, but use of graded pressure is improving.  Frequent rest breaks needed when working to pick up and place ball pegs and grooved pegs this date.  Pt also frequently requires multiple attempts to pick up a peg to achieve desired prehension and does drop several pegs.  Improved accuracy with distal mobility continues to be achieved with proximal stabilization.  Pt continues to present with decreased motor control when attempting [facility] tasks at an elevated plane.  Pt continues to benefit from OT services to improve Left hand function, improve LUE ataxia, use of graded pressure with grasping, and [facility] skills in order to be able to use his hand when challenged in multiple contexts, and with dual tasking. Pt will benefit from education about compensatory strategies for left hand function during ADLs, and IADLs.    PERFORMANCE DEFICITS: in functional skills including ADLs, IADLs, coordination, dexterity, ROM, strength, Fine motor control, and Gross motor control, cognitive skills including attention, memory, problem solving, and safety awareness, and psychosocial skills including coping strategies, environmental adaptation, habits, and routines and behaviors.   IMPAIRMENTS: are limiting patient from ADLs, IADLs, play, leisure, and social participation.   CO-MORBIDITIES: may have co-morbidities  that affects occupational performance. Patient will benefit from skilled OT to address above impairments and improve overall function.  MODIFICATION OR ASSISTANCE TO COMPLETE EVALUATION: Min-Moderate modification of tasks or assist with assess necessary to complete an evaluation.  OT OCCUPATIONAL PROFILE AND HISTORY: Detailed assessment: Review of records and additional review of physical, cognitive, psychosocial history related to current functional performance.  CLINICAL DECISION MAKING: Moderate - several treatment  options, min-mod task modification necessary  REHAB POTENTIAL: Good  EVALUATION COMPLEXITY: Moderate    PLAN:  OT FREQUENCY: 2x/week  OT DURATION: 12 weeks  PLANNED INTERVENTIONS: self care/ADL  training, therapeutic exercise, therapeutic activity, patient/family education, cognitive remediation/compensation, and DME and/or AE instructions, neuromuscular re-education  RECOMMENDED OTHER SERVICES: OT services.  CONSULTED AND AGREED WITH PLAN OF CARE: Patient and family member/caregiver-wife  PLAN FOR NEXT SESSION: Initiate treatment  Leta Speller, MS, OTR/L   Darleene Cleaver, OT 02/04/2023, 11:23 AM

## 2023-02-06 ENCOUNTER — Inpatient Hospital Stay: Payer: Medicare Other

## 2023-02-06 ENCOUNTER — Ambulatory Visit: Payer: Medicare Other

## 2023-02-06 ENCOUNTER — Inpatient Hospital Stay: Payer: Medicare Other | Attending: Internal Medicine

## 2023-02-06 VITALS — BP 140/84

## 2023-02-06 DIAGNOSIS — N183 Chronic kidney disease, stage 3 unspecified: Secondary | ICD-10-CM | POA: Insufficient documentation

## 2023-02-06 DIAGNOSIS — D461 Refractory anemia with ring sideroblasts: Secondary | ICD-10-CM | POA: Insufficient documentation

## 2023-02-06 DIAGNOSIS — R2689 Other abnormalities of gait and mobility: Secondary | ICD-10-CM

## 2023-02-06 DIAGNOSIS — R2681 Unsteadiness on feet: Secondary | ICD-10-CM

## 2023-02-06 DIAGNOSIS — R41841 Cognitive communication deficit: Secondary | ICD-10-CM

## 2023-02-06 DIAGNOSIS — D469 Myelodysplastic syndrome, unspecified: Secondary | ICD-10-CM

## 2023-02-06 DIAGNOSIS — R262 Difficulty in walking, not elsewhere classified: Secondary | ICD-10-CM

## 2023-02-06 DIAGNOSIS — M6281 Muscle weakness (generalized): Secondary | ICD-10-CM

## 2023-02-06 DIAGNOSIS — R278 Other lack of coordination: Secondary | ICD-10-CM

## 2023-02-06 DIAGNOSIS — I69254 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: Secondary | ICD-10-CM

## 2023-02-06 DIAGNOSIS — R471 Dysarthria and anarthria: Secondary | ICD-10-CM

## 2023-02-06 LAB — HEMOGLOBIN AND HEMATOCRIT, BLOOD
HCT: 29 % — ABNORMAL LOW (ref 39.0–52.0)
Hemoglobin: 9 g/dL — ABNORMAL LOW (ref 13.0–17.0)

## 2023-02-06 MED ORDER — DARBEPOETIN ALFA 100 MCG/0.5ML IJ SOSY
100.0000 ug | PREFILLED_SYRINGE | Freq: Once | INTRAMUSCULAR | Status: AC
  Administered 2023-02-06: 100 ug via SUBCUTANEOUS
  Filled 2023-02-06: qty 0.5

## 2023-02-06 NOTE — Therapy (Signed)
OUTPATIENT PHYSICAL THERAPY TREATMENT   Patient Name: Vernon [name] MRN: [id number] DOB:02-05-1946, 77 y.o., male Today's Date: 02/04/2023   PCP:  Baxter Hire, MD   REFERRING PROVIDER:  Baxter Hire, MD    END OF SESSION:  PT End of Session - 02/04/23 1004     Visit Number 7    Number of Visits 25    Date for PT Re-Evaluation 04/03/23    Progress Note Due on Visit 10    PT Start Time 1011    PT Stop Time 1055    PT Time Calculation (min) 44 min    Equipment Utilized During Treatment Gait belt    Activity Tolerance Patient tolerated treatment well    Behavior During Therapy WFL for tasks assessed/performed               Past Medical History:  Diagnosis Date   Anemia    Aortic stenosis    Arthritis    Complication of anesthesia    hard time getting bp up after knee replacement   Coronary artery disease    Diabetes mellitus without complication (HCC)    GERD (gastroesophageal reflux disease)    occ tums prn   History of hiatal hernia    Hypertension    MDS (myelodysplastic syndrome) ([place])    Presence of Watchman left atrial appendage closure device 07/26/2022   61m Watchman FLX with Dr. 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;  Surgeon: EKristeen Miss MD;  [facility];  Service: Neurosurgery;  Laterality: N/A;   JOINT REPLACEMENT Right 2010   LEFT ATRIAL APPENDAGE OCCLUSION N/A 07/26/2022   Procedure: LEFT ATRIAL APPENDAGE OCCLUSION;  Surgeon: LVickie Epley MD;  [facility];  Service: Cardiovascular;  Laterality: N/A;   SHOULDER ARTHROSCOPY WITH ROTATOR CUFF REPAIR AND OPEN BICEPS TENODESIS Right 11/09/2019   Procedure: RIGHT SHOULDER ARTHROSCOPY WITH SUBSCAPULARIS REPAIR, SUBACROMIAL DECOMPRESSION,MINI OPEN ROTATOR CUFF REPAIR;  Surgeon: PLeim Fabry MD;  Location: [facility]  ORS;  Service: Orthopedics;  Laterality: Right;   TEE WITHOUT CARDIOVERSION N/A 07/26/2022   Procedure: TRANSESOPHAGEAL ECHOCARDIOGRAM (TEE);  Surgeon: LVickie Epley MD;  [facility];  Service: Cardiovascular;  Laterality: N/A;   Patient Active Problem List   Diagnosis Date Noted   Urinary tract infection without hematuria 12/19/2022   Painless jaundice 12/18/2022   Elevated LFTs 12/18/2022   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   Obstructive jaundice 12/17/2022   Atrial fibrillation ([place] 07/26/2022   Presence of Watchman left atrial appendage closure device 07/26/2022   Proteinuria, unspecified 05/23/2022   Simple renal cyst 05/23/2022   Long term current use of anticoagulant 04/04/2022   Rotator cuff syndrome 04/04/2022   Exposure to potentially hazardous substance 04/04/2022   Gout 04/04/2022   Osteoarthritis 04/04/2022   Osteoporosis 04/04/2022   Other specified diseases of hair and hair follicles 00000000  Pain in joint, lower leg 04/04/2022   Problem related to unspecified psychosocial circumstances 04/04/2022   General medical examination for administrative purposes 04/04/2022   Stage 3b chronic kidney disease ([place]    Dysphagia, post-stroke    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 hemorrhage of brain ([place] 02/26/2022   Cough [id number]  Acute metabolic encephalopathy 0123XX123  Obstructive hydrocephalus ([place] 02/19/2022   Dyslipidemia 02/19/2022   Dysphagia 02/19/2022   AKI (acute kidney injury) ([place]  Anemia    Pleural effusion on right    Respiratory failure requiring intubation (HCC)    Cerebral edema (HCC)    Chronic atrial fibrillation (HCC)    Controlled type 2 diabetes mellitus with hyperglycemia, without long-term current use of insulin (HCC)    ICH (intracerebral hemorrhage) ([place]) 02/10/2022   Intracranial hemorrhage (HCC)    Anticoagulated    Moderate tricuspid regurgitation 07/26/2021   Moderate aortic valve stenosis 03/08/2020   MDS  (myelodysplastic syndrome) ([place]) 02/18/2020   Macrocytic anemia 01/13/2020   Spleen enlargement 01/13/2020   Bilateral carotid artery stenosis 07/14/2018   Atrial fibrillation, chronic ([place]) 07/14/2018   Type 2 diabetes with nephropathy ([place]) 02/14/2016   Essential hypertension 08/16/2014   Hyperlipemia, mixed 08/16/2014   Renal insufficiency 08/16/2014    ONSET DATE: 02/10/2022  REFERRING DIAG:  I63.9 (ICD-10-CM) - Cerebral infarction, unspecified  I63.9 (ICD-10-CM) - CVA (cerebral vascular accident) ([place])    THERAPY DIAG:  Muscle weakness (generalized)  Other lack of coordination  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 Pt hospitalized 1/22-1/25/24 for obstructive jaundice. Pt reports it was found he had a UTI with recent hospital stay. He wants to improve his endurance and balance. PMH significant for MDS, chronic a-fib, HTN, DMII, Presence of Watchman Left atrial appendage closure device, anemia, arthritis, hx of hiatal hernia, hx craniotomy 02/10/22, L atrial appendage occlusion 07/26/22, TEE without cardioversion 07/26/2022.     PAIN:  Are you having pain? Yes, 4/10 Left knee resting, worse with standing, walking,  terminal extension  PRECAUTIONS: Fall  WEIGHT BEARING RESTRICTIONS: No  FALLS: Has patient fallen in last 6 months? No  PATIENT GOALS: improve coordination, endurance and balance  OBJECTIVE:    INTERVENTION THIS DATE:  -transfer from transport chair to plinth, no device, min guard assist -hooklying LLE SAQ 1x15 -LLE distraction mobilization 1x30sec grade 4 -LLE SAQ 1x15 @ 5lb AW  -  LLE distraction mobilization 1x30sec grade 4 -LLE knee/hip flexion 1x10 AA/ROM for passive knee flexion   -AMB overground 370f c 4WW 2.5lb AW bilat (2lb unavailable)   *seated recovery  -AMB overground [id number] 4WW 2.5lb AW bilat (2lb unavailable)  *seated recovery   -four square stepping practice x 4 minutes, no UE support, no device *>10 instances of excessive sway and LOB beyond the point that patient can correct without author providing maxA for postural sway *cues to employee stepping strategy for recovery if/when able  -normal stance, firm surface eyes closed 1x60sec    PATIENT EDUCATION: Education details: exam findings, plan, testing Person educated: Patient and Spouse Education method: Explanation Education comprehension: verbalized understanding  HOME EXERCISE PROGRAM: To be initiated next 1-2 visits   GOALS: Goals reviewed with patient? Yes  SHORT TERM GOALS: Target date: 02/20/2023  Patient will be independent in home exercise program to improve strength/mobility for better functional independence with ADLs. Baseline: to be intiated Goal status: INITIAL   LONG TERM GOALS: Target date: 04/03/2023   Patient will complete five times sit to stand test in < 10 seconds indicating an increased LE strength and improved balance. Baseline: 14.89 sec Goal status: INITIAL  2.  Patient will increase 10 meter walk test to >1.87m48mas to improve gait speed for better community ambulation and to reduce fall risk. Baseline:  0.82 m/s Goal status: INITIAL  3.  Patient will increase Berg  Balance score by > 6 points to demonstrate decreased fall risk during functional activities. Baseline: 39/56 Goal status: INITIAL  4.  Patient will increase six minute walk test distance to >1000 for progression to community ambulator and improve gait ability Baseline: 795' Goal status: INITIAL  5.  Patient will demonstrate improved function as evidenced by a score of 62 on FOTO measure for full participation in activities at home and in the community. Baseline: 59 Goal status: INITIAL  ASSESSMENT:  CLINICAL IMPRESSION: Continued with current plan of care as laid out in evaluation and recent prior sessions. All interventions tolerated as expected by author, whereas left knee DJD restricts tolerance to weightbearing activity made worse by neurological deficits and decreased gross motor control of joint. Balance remains a significant impairment. Recovery intervals given as needed based on signs of exertion and/or pt request. Pt educated on best technique for each intervention- author uses verbal, visual, tactile cues to optimize learning. Author takes steps to maximize patient independence when appropriate. Pt remains highly motivated. Pt closely monitored throughout session for safe activity response, as well as to maximize patient safety during interventions. The patient's therapy prognosis indicates continued potential for improvement, anticipate that future progress is attainable in a reasonable/predictable timeframe. Maximum improvement is within reach. Pt will continue to benefit from skilled PT services to address deficits and impairment identified in evaluation in order to maximize independence and safety in basic mobility required for performance of ADL, IADL, and leisure.     OBJECTIVE IMPAIRMENTS: Abnormal gait, decreased activity tolerance, decreased balance, decreased coordination, decreased endurance, decreased knowledge of use of DME, decreased mobility, difficulty walking, decreased  strength, impaired UE functional use, improper body mechanics, postural dysfunction, and pain.   ACTIVITY LIMITATIONS: carrying, lifting, bending, standing, squatting, stairs, transfers, and locomotion level  PARTICIPATION LIMITATIONS: meal prep, cleaning, laundry, driving, shopping, community activity, and yard work  PERSONAL FACTORS: Age, Past/current experiences, Time since onset of injury/illness/exacerbation, and 3+ comorbidities: PMH significant for MDS, chronic a-fib, HTN, DMII, Presence of Watchman Left atrial appendage closure device,  anemia, arthritis, hx of hiatal hernia, hx craniotomy 02/10/22, L atrial appendage occlusion 07/26/22, TEE without cardioversion 07/26/2022  are also affecting patient's functional outcome.   REHAB POTENTIAL: Good  CLINICAL DECISION MAKING: Evolving/moderate complexity  EVALUATION COMPLEXITY: Moderate  PLAN:  PT FREQUENCY: 2x/week  PT DURATION: 12 weeks  PLANNED INTERVENTIONS: Therapeutic exercises, Therapeutic activity, Neuromuscular re-education, Balance training, Gait training, Patient/Family education, Self Care, Joint mobilization, Stair training, Vestibular training, Canalith repositioning, Visual/preceptual remediation/compensation, Orthotic/Fit training, DME instructions, Electrical stimulation, Wheelchair mobility training, Spinal mobilization, Cryotherapy, Moist heat, Splintting, Taping, Ultrasound, Manual therapy, and Re-evaluation  PLAN FOR NEXT SESSION:  Continue with strengthening exercises and balance related tasks as tolerated given his left knee DJD limitations  3:29 PM, 02/06/23 Etta Grandchild, PT, DPT Physical Therapist - [facility] [place] [phone]     02/04/23, 10:58 AM

## 2023-02-06 NOTE — Therapy (Signed)
OUTPATIENT OCCUPATIONAL THERAPY NEURO TREATMENT  Patient Name: Vernon [name] MRN: [id number] DOB:[birth year], 77 y.o., male Today's Date: 02/06/2023  REFERRING PROVIDER:  Harrel Lemon, MD  END OF SESSION:  OT End of Session - 02/06/23 1453     Visit Number 10    Number of Visits 24    Date for OT Re-Evaluation 04/01/23    Authorization Time Period Progress reporting period starting 01/07/2023-02/06/23    Progress Note Due on Visit 10    OT Start Time 1430    OT Stop Time 1515    OT Time Calculation (min) 45 min    Equipment Utilized During Treatment transport chair    Activity Tolerance Patient tolerated treatment well    Behavior During Therapy WFL for tasks assessed/performed             Past Medical History:  Diagnosis Date   Anemia    Aortic stenosis    Arthritis    Complication of anesthesia    hard time getting bp up after knee replacement   Coronary artery disease    Diabetes mellitus without complication (HCC)    GERD (gastroesophageal reflux disease)    occ tums prn   History of hiatal hernia    Hypertension    MDS (myelodysplastic syndrome) ([place])    Presence of Watchman left atrial appendage closure device 07/26/2022   51m Watchman FLX with Dr. 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;  Surgeon: EKristeen Miss MD;  [facility];  Service: Neurosurgery;  Laterality: N/A;   JOINT REPLACEMENT Right 2010   LEFT ATRIAL APPENDAGE OCCLUSION N/A 07/26/2022   Procedure: LEFT ATRIAL APPENDAGE OCCLUSION;  Surgeon: LVickie Epley MD;  [facility];  Service: Cardiovascular;  Laterality: N/A;   SHOULDER ARTHROSCOPY WITH ROTATOR CUFF REPAIR AND OPEN BICEPS TENODESIS Right 11/09/2019   Procedure: RIGHT SHOULDER ARTHROSCOPY WITH SUBSCAPULARIS REPAIR, SUBACROMIAL DECOMPRESSION,MINI OPEN ROTATOR  CUFF REPAIR;  Surgeon: PLeim Fabry MD;  Location: [facility];  Service: Orthopedics;  Laterality: Right;   TEE WITHOUT CARDIOVERSION N/A 07/26/2022   Procedure: TRANSESOPHAGEAL ECHOCARDIOGRAM (TEE);  Surgeon: LVickie Epley MD;  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   Obstructive jaundice 12/17/2022   Atrial fibrillation ([facility] 07/26/2022   Presence of Watchman left atrial appendage closure device 07/26/2022   Proteinuria, unspecified 05/23/2022   Simple renal cyst 05/23/2022   Long term current use of anticoagulant 04/04/2022   Rotator cuff syndrome 04/04/2022   Exposure to potentially hazardous substance 04/04/2022   Gout 04/04/2022   Osteoarthritis 04/04/2022   Osteoporosis 04/04/2022   Other specified diseases of hair and hair follicles 00000000  Pain in joint, lower leg 04/04/2022   Problem related to unspecified psychosocial circumstances 04/04/2022   General medical examination for administrative purposes 04/04/2022   Stage 3b chronic kidney disease ([place]    Dysphagia, post-stroke    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 hemorrhage of brain ([facility] 02/26/2022   Cough [id number]  Acute metabolic encephalopathy [id number]  Obstructive hydrocephalus ([facility] 02/19/2022   Dyslipidemia 02/19/2022   Dysphagia 02/19/2022   AKI (acute kidney injury) ([facility]    Anemia  Pleural effusion on right    Respiratory failure requiring intubation (HCC)    Cerebral edema (HCC)    Chronic atrial fibrillation (HCC)    Controlled type 2 diabetes mellitus with hyperglycemia, without long-term current use of insulin (HCC)    ICH (intracerebral hemorrhage) ([place]) 02/10/2022   Intracranial hemorrhage (HCC)    Anticoagulated    Moderate tricuspid regurgitation 07/26/2021    Moderate aortic valve stenosis 03/08/2020   MDS (myelodysplastic syndrome) ([place]) 02/18/2020   Macrocytic anemia 01/13/2020   Spleen enlargement 01/13/2020   Bilateral carotid artery stenosis 07/14/2018   Atrial fibrillation, chronic ([place]) 07/14/2018   Type 2 diabetes with nephropathy ([place]) 02/14/2016   Essential hypertension 08/16/2014   Hyperlipemia, mixed 08/16/2014   Renal insufficiency 08/16/2014    ONSET DATE: 02/10/2022  REFERRING DIAG:  CVA  THERAPY DIAG:  Coordination Other lack of coordination  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  Pt accompanied by: significant other  PERTINENT HISTORY: Pt. is a 77 y. 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 Pt. received outpt. OT services following the CVA for Left sided weakness, and incoordination. Pt. was recently hospitalized from 1/22-1/25/24 for Obstructive Jaundice. PMHx includes: MDS, Chronic A-Fib, Essential HTN, Type II DM, Dyslipidemia, Presence of Watchman Left atrial appendage closure device, GERD  PRECAUTIONS: None  WEIGHT BEARING RESTRICTIONS: No  PAIN:  Are you having pain? L knee 2/10 pain achy; rest improves pain, walking worsens pain  FALLS: Has patient fallen in last 6 months? No  LIVING ENVIRONMENT: Lives with: lives with their spouse Lives in: House/apartment one storey, can fully live on the 1st floor. Stairs: Ramped entrance  Has following equipment at home: 4 wheeled walker, w/c-manual, grab bars, shower chair, HH shower head, toilet riser with arms.  PLOF: Independent  PATIENT GOALS: To  improve arm and hand function   OBJECTIVE:   HAND DOMINANCE: Right  ADLs: Transfers/ambulation related to ADLs: Eating: Difficulty with motor control control Grooming:  Independent UB Dressing:  Diffiulty buttoning when no looking directly at the buttons.  LB Dressing: Independent donning pants, socks, and slide on shoes. Toileting: Independent Bathing: Independent, wife assists with towel. Tub Shower transfers: Modified Independence  in the shower, CGA transfers out of shoer.   IADLs: Shopping:  Pt.'s wife performs, occasionally uses grocery store scooters Light housekeeping: Wife performs housekeeping tasks. Meal Prep: Prepares light snacks, and light meal prep Community mobility: Relies on family and friends Medication management: Independent Financial management: Does  with wife; No changes in how they are doing them Handwriting: 100% legible for name: Wife has noticed some changes with writing  Hobbies: Plays the guitar, workshop, music shop.  MOBILITY STATUS: Needs Assist: uses a rollator  POSTURE COMMENTS:  No Significant postural limitations Sitting balance: WFL  ACTIVITY TOLERANCE: Activity tolerance:  Limited  FUNCTIONAL OUTCOME MEASURES:  FOTO: 59; TR score: 60  UPPER EXTREMITY ROM:    Active ROM Right Eval  Left Eval [facility]  Shoulder flexion 106   Shoulder abduction 85   Shoulder adduction    Shoulder extension    Shoulder internal rotation    Shoulder external rotation    Elbow  flexion WFL   Elbow extension WFL   Wrist flexion    Wrist extension WFL   Wrist ulnar deviation    Wrist radial deviation    Wrist pronation    Wrist supination WFL   (Blank rows = not tested)  Digit flexion to the [facility]:  2nd: 0cm, 3rd: 1.5cm, 4th: 2.5cm, 5th: 0cm   UPPER EXTREMITY MMT:     MMT Right eval Left eval  Shoulder flexion 3- 4  Shoulder abduction 3- 4  Shoulder adduction    Shoulder extension    Shoulder internal rotation    Shoulder external  rotation    Middle trapezius    Lower trapezius    Elbow flexion 4 4  Elbow extension 4 4  Wrist flexion    Wrist extension 4 4  Wrist ulnar deviation    Wrist radial deviation    Wrist pronation    Wrist supination    (Blank rows = not tested)  HAND FUNCTION: Grip strength: N/A secondary to arthritis, and 3rd digit trigger finger    Lateral Pinch: R: 23#, L: 20#  3 Pt. Pinch: R: 22#, L: 21# 02/06/23: Lateral Pinch: R: 22#, L: 20#  3 Pt. Pinch: R: 22#, L: 17#   COORDINATION: 9 Hole Peg test: Right: 25 sec; Left: 2 min. & 34  sec 02/06/23: 9 Hole Peg test: Left: 2 min. & 34  sec  SENSATION: WFL Light touch: WFL Stereognosis: WFL  EDEMA:   N/A  MUSCLE TONE:  Intact  COGNITION: Overall cognitive status: Within functional limits for tasks assessed  VISION: Subjective report: Pt. reports double vision initially at the onset of the CVA with difficulty scanning to left when reading. Pt. Reports this has improved, and the double vision has resolved.   PERCEPTION: WFL  PRAXIS: Impaired motor control, and coordination   TODAY'S TREATMENT:                                                                                                                              Neuromuscular re-education: Facilitated L hand FMC/GMC skills working to pick up and place ball pegs into pegboard.  Began placing pegs with pegboard flat on table top, then transitioned to pegboard on an incline to further challenge [place].  Pt required frequent rest breaks to complete the perimeter of the board with each trial.  Pt removed pegs from board using L hand, placing pegs into a container which OT held at shoulder height to facilitate forward and lateral reaching patterns.  Pt worked also with grooved pegs, placing pegs into pegboard with support of L forearm on table top to increase distal stability.  Pt required frequent rest breaks as coordination worsens with fatigue.    PATIENT EDUCATION: Education details:   LUE Nature conservation officer Person educated: pt Education method: Explanation, Demonstration, and Tactile cues Education comprehension: verbalized understanding, returned demonstration, verbal cues required, tactile cues required, and needs further education  Home Exercise Program:  Continue to assess and provide as indicated  GOALS: Goals reviewed with patient?  SHORT TERM GOALS: Target date: 02/18/2023    Pt. will demonstrate independence with HEP for LUE functioning Baseline: Eval: No current HEP Goal status: INITIAL  LONG TERM GOALS: Target date:  04/01/2023    Pt. will improve FOTO score by 1 point for patient perceived improvement with assessment specific ADLs, and IADLs.  Baseline: Eval: FOTO score: 59; TR score: 60 Goal status: INITIAL  2.  Pt. will independently modulate the accuracy of holding items without overgripping when reaching for items 50% of the time Baseline: Eval: Pt. is consistently overpowering, and over gripping items when grasping them with the left hand Goal status: INITIAL  3.  Pt. will improve left hand [facility] skills by 10 sec. to improve left hand grasp on objects.  Baseline: Eval: R: 25 sec. L: 2 min. & 34 sec.  Goal status: INITIAL  4.  Pt. Will utilize compensatory strategies for left hand function motor coordination 75% during ADLs, and IADL tasks. Baseline: Eval: Minimal use of compensatory strategies during ADLs, and IADLs. Goal status: INITIAL  5.  Pt. Will use compensatory strategies for left hand function 75% of the time when utilizing multiple body systems, dual tasking, and using the hand in a variety of different contexts. Baseline: Eval: Pt. Consistently has difficulty using his left hand to complete tasks when he is not looking directly at his hand, and has more difficulty using his left hand when standing, or attempting to walk with the walker.  Goal status: INITIAL  ASSESSMENT:   CLINICAL IMPRESSION: Pt continues to consistently  practice his bass at home for about 5 min at a time and states that he can see that he's improving with his ability to use less force when pressing the strings on his guitar, or when picking up a peg during an OT activity.  Pt reports he still can't control his bigger arm movements well, but use of graded pressure is improving.  Frequent rest breaks needed when working to pick up and place ball pegs and grooved pegs this date.  Pt also frequently requires multiple attempts to pick up a peg to achieve desired prehension and does drop several pegs.  Improved accuracy with distal mobility continues to be achieved with proximal stabilization.  Pt continues to present with decreased motor control when attempting [facility] tasks at an elevated plane.  Pt continues to benefit from OT services to improve Left hand function, improve LUE ataxia, use of graded pressure with grasping, and [place] skills in order to be able to use his hand when challenged in multiple contexts, and with dual tasking. Pt will benefit from education about compensatory strategies for left hand function during ADLs, and IADLs.    PERFORMANCE DEFICITS: in functional skills including ADLs, IADLs, coordination, dexterity, ROM, strength, Fine motor control, and Gross motor control, cognitive skills including attention, memory, problem solving, and safety awareness, and psychosocial skills including coping strategies, environmental adaptation, habits, and routines and behaviors.   IMPAIRMENTS: are limiting patient from ADLs, IADLs, play, leisure, and social participation.   CO-MORBIDITIES: may have co-morbidities  that affects occupational performance. Patient will benefit from skilled OT to address above impairments and improve overall function.  MODIFICATION OR ASSISTANCE TO COMPLETE EVALUATION: Min-Moderate modification of tasks or assist with assess necessary to complete an evaluation.  OT OCCUPATIONAL PROFILE AND HISTORY: Detailed assessment: Review  of records and additional review of physical, cognitive, psychosocial history related to current  functional performance.  CLINICAL DECISION MAKING: Moderate - several treatment options, min-mod task modification necessary  REHAB POTENTIAL: Good  EVALUATION COMPLEXITY: Moderate    PLAN:  OT FREQUENCY: 2x/week  OT DURATION: 12 weeks  PLANNED INTERVENTIONS: self care/ADL training, therapeutic exercise, therapeutic activity, patient/family education, cognitive remediation/compensation, and DME and/or AE instructions, neuromuscular re-education  RECOMMENDED OTHER SERVICES: OT services.  CONSULTED AND AGREED WITH PLAN OF CARE: Patient and family member/caregiver-wife  PLAN FOR NEXT SESSION: Initiate treatment  Leta Speller, MS, OTR/L   Darleene Cleaver, OT 02/06/2023, 2:56 PM

## 2023-02-11 ENCOUNTER — Ambulatory Visit: Payer: Medicare Other

## 2023-02-11 DIAGNOSIS — R2681 Unsteadiness on feet: Secondary | ICD-10-CM

## 2023-02-11 DIAGNOSIS — M6281 Muscle weakness (generalized): Secondary | ICD-10-CM

## 2023-02-11 DIAGNOSIS — R278 Other lack of coordination: Secondary | ICD-10-CM

## 2023-02-11 DIAGNOSIS — R262 Difficulty in walking, not elsewhere classified: Secondary | ICD-10-CM

## 2023-02-11 DIAGNOSIS — I69254 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: Secondary | ICD-10-CM

## 2023-02-11 DIAGNOSIS — R2689 Other abnormalities of gait and mobility: Secondary | ICD-10-CM

## 2023-02-11 NOTE — Therapy (Signed)
OUTPATIENT OCCUPATIONAL THERAPY NEURO TREATMENT NOTE   Patient Name: Vernon [name] MRN: [id number] DOB:1946/05/10, 77 y.o., male Today's Date: 02/11/2023  REFERRING PROVIDER:  Harrel Lemon, MD  END OF SESSION:  OT End of Session - 02/11/23 1101     Visit Number 11    Number of Visits 24    Date for OT Re-Evaluation 04/01/23    Authorization Time Period Progress reporting period starting 01/07/2023-02/06/23    Progress Note Due on Visit 10    OT Start Time 1100    OT Stop Time 1145    OT Time Calculation (min) 45 min    Equipment Utilized During Treatment transport chair    Activity Tolerance Patient tolerated treatment well    Behavior During Therapy WFL for tasks assessed/performed             Past Medical History:  Diagnosis Date   Anemia    Aortic stenosis    Arthritis    Complication of anesthesia    hard time getting bp up after knee replacement   Coronary artery disease    Diabetes mellitus without complication (HCC)    GERD (gastroesophageal reflux disease)    occ tums prn   History of hiatal hernia    Hypertension    MDS (myelodysplastic syndrome) ([place])    Presence of Watchman left atrial appendage closure device 07/26/2022   26mm Watchman FLX with Dr. Quentin Ore   Past Surgical History:  Procedure Laterality Date   CARPAL TUNNEL RELEASE  2012   CRANIOTOMY N/A 02/10/2022   Procedure: SUBOCCIPITAL CRANIECTOMY FOR EVACUATION OF CEREBELLAR HEMATOMA;  Surgeon: Kristeen Miss, MD;  Location: [facility];  Service: Neurosurgery;  Laterality: N/A;   JOINT REPLACEMENT Right 2010   LEFT ATRIAL APPENDAGE OCCLUSION N/A 07/26/2022   Procedure: LEFT ATRIAL APPENDAGE OCCLUSION;  Surgeon: Vickie Epley, MD;  Location: [facility];  Service: Cardiovascular;  Laterality: N/A;   SHOULDER ARTHROSCOPY WITH ROTATOR CUFF REPAIR AND OPEN BICEPS TENODESIS Right 11/09/2019   Procedure: RIGHT SHOULDER ARTHROSCOPY WITH SUBSCAPULARIS REPAIR, SUBACROMIAL DECOMPRESSION,MINI OPEN  ROTATOR CUFF REPAIR;  Surgeon: Leim Fabry, MD;  Location: [facility];  Service: Orthopedics;  Laterality: Right;   TEE WITHOUT CARDIOVERSION N/A 07/26/2022   Procedure: TRANSESOPHAGEAL ECHOCARDIOGRAM (TEE);  Surgeon: Vickie Epley, MD;  Location: [facility];  Service: Cardiovascular;  Laterality: N/A;   Patient Active Problem List   Diagnosis Date Noted   Urinary tract infection without hematuria 12/19/2022   Painless jaundice 12/18/2022   Elevated LFTs 12/18/2022   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   Obstructive jaundice 12/17/2022   Atrial fibrillation ([place]) 07/26/2022   Presence of Watchman left atrial appendage closure device 07/26/2022   Proteinuria, unspecified 05/23/2022   Simple renal cyst 05/23/2022   Long term current use of anticoagulant 04/04/2022   Rotator cuff syndrome 04/04/2022   Exposure to potentially hazardous substance 04/04/2022   Gout 04/04/2022   Osteoarthritis 04/04/2022   Osteoporosis 04/04/2022   Other specified diseases of hair and hair follicles 0000000   Pain in joint, lower leg 04/04/2022   Problem related to unspecified psychosocial circumstances 04/04/2022   General medical examination for administrative purposes 04/04/2022   Stage 3b chronic kidney disease ([place])    Dysphagia, post-stroke    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 hemorrhage of brain ([place]) 02/26/2022   Cough 123XX123   Acute metabolic encephalopathy 123XX123   Obstructive hydrocephalus ([place]) 02/19/2022   Dyslipidemia 02/19/2022   Dysphagia 02/19/2022   AKI (acute kidney injury) ([place])  Anemia    Pleural effusion on right    Respiratory failure requiring intubation (HCC)    Cerebral edema (HCC)    Chronic atrial fibrillation (HCC)    Controlled type 2 diabetes mellitus with hyperglycemia, without long-term current use of insulin (HCC)    ICH (intracerebral hemorrhage) ([place]) 02/10/2022   Intracranial hemorrhage (HCC)    Anticoagulated    Moderate tricuspid regurgitation 07/26/2021    Moderate aortic valve stenosis 03/08/2020   MDS (myelodysplastic syndrome) ([place]) 02/18/2020   Macrocytic anemia 01/13/2020   Spleen enlargement 01/13/2020   Bilateral carotid artery stenosis 07/14/2018   Atrial fibrillation, chronic ([place]) 07/14/2018   Type 2 diabetes with nephropathy ([place]) 02/14/2016   Essential hypertension 08/16/2014   Hyperlipemia, mixed 08/16/2014   Renal insufficiency 08/16/2014    ONSET DATE: 02/10/2022  REFERRING DIAG:  CVA  THERAPY DIAG:  Coordination Muscle weakness (generalized)  Other lack of coordination  Rationale for Evaluation and Treatment: Rehabilitation  SUBJECTIVE:  SUBJECTIVE STATEMENT: Pt verbalizes frustration with the lack of control in his L arm and hand. Pt accompanied by: significant other  PERTINENT HISTORY: Pt. is a 35 y. 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 Pt. received outpt. OT services following the CVA for Left sided weakness, and incoordination. Pt. was recently hospitalized from 1/22-1/25/24 for Obstructive Jaundice. PMHx includes: MDS, Chronic A-Fib, Essential HTN, Type II DM, Dyslipidemia, Presence of Watchman Left atrial appendage closure device, GERD  PRECAUTIONS: None  WEIGHT BEARING RESTRICTIONS: No  PAIN:  Are you having pain? L knee 2/10 pain achy; rest improves pain, walking worsens pain  FALLS: Has patient fallen in last 6 months? No  LIVING ENVIRONMENT: Lives with: lives with their spouse Lives in: House/apartment one storey, can fully live on the 1st floor. Stairs: Ramped entrance  Has following equipment at home: 4 wheeled walker, w/c-manual, grab bars, shower chair, HH shower head, toilet riser with arms.  PLOF: Independent  PATIENT GOALS: To improve arm and hand function   OBJECTIVE:   HAND DOMINANCE: Right  ADLs: Transfers/ambulation related to ADLs: Eating: Difficulty with motor control control Grooming:   Independent UB Dressing:  Diffiulty buttoning when no looking directly at the buttons.  LB Dressing: Independent donning pants, socks, and slide on shoes. Toileting: Independent Bathing: Independent, wife assists with towel. Tub Shower transfers: Modified Independence  in the shower, CGA transfers out of shoer.   IADLs: Shopping:  Pt.'s wife performs, occasionally uses grocery store scooters Light housekeeping: Wife performs housekeeping tasks. Meal Prep: Prepares light snacks, and light meal prep Community mobility: Relies on family and friends Medication management: Independent Financial management: Does  with wife; No changes in how they are doing them Handwriting: 100% legible for name: Wife has noticed some changes with writing  Hobbies: Plays the guitar, workshop, music shop.  MOBILITY STATUS: Needs Assist: uses a rollator  POSTURE COMMENTS:  No Significant postural limitations Sitting balance: WFL  ACTIVITY TOLERANCE: Activity tolerance:  Limited  FUNCTIONAL OUTCOME MEASURES:  FOTO: 59; TR score: 60 02/06/23: 61  UPPER EXTREMITY ROM:    Active ROM Right Eval  Left Eval [facility]  Shoulder flexion 106   Shoulder abduction 85   Shoulder adduction    Shoulder extension    Shoulder internal rotation    Shoulder external rotation    Elbow flexion WFL   Elbow extension WFL   Wrist flexion    Wrist extension WFL   Wrist ulnar deviation  Wrist radial deviation    Wrist pronation    Wrist supination WFL   (Blank rows = not tested)  Digit flexion to the [facility]:  2nd: 0cm, 3rd: 1.5cm, 4th: 2.5cm, 5th: 0cm   UPPER EXTREMITY MMT:     MMT Right eval Left eval  Shoulder flexion 3- 4  Shoulder abduction 3- 4  Shoulder adduction    Shoulder extension    Shoulder internal rotation    Shoulder external rotation    Middle trapezius    Lower trapezius    Elbow flexion 4 4  Elbow extension 4 4  Wrist flexion    Wrist extension 4 4  Wrist ulnar deviation    Wrist  radial deviation    Wrist pronation    Wrist supination    (Blank rows = not tested)  HAND FUNCTION: Grip strength: N/A secondary to arthritis, and 3rd digit trigger finger    Lateral Pinch: R: 23#, L: 20#  3 Pt. Pinch: R: 22#, L: 21# 02/06/23: Lateral Pinch: R: 22#, L: 20#  3 Pt. Pinch: R: 22#, L: 17#  COORDINATION: 9 Hole Peg test: Right: 25 sec; Left: 2 min. & 34  sec 02/06/23: 9 Hole Peg test: Left: 1 min. & 40 sec  SENSATION: WFL Light touch: WFL Stereognosis: WFL  EDEMA:   N/A  MUSCLE TONE:  Intact  COGNITION: Overall cognitive status: Within functional limits for tasks assessed  VISION: Subjective report: Pt. reports double vision initially at the onset of the CVA with difficulty scanning to left when reading. Pt. Reports this has improved, and the double vision has resolved.   PERCEPTION: WFL  PRAXIS: Impaired motor control, and coordination   TODAY'S TREATMENT:          Self Care: Pt practiced 1 and 2 handed drinking and pouring from cups with a handle using L hand.  Pt given vc for resting elbows on table to increase distal stability.  Encouraged 2 hands to drink from cup at home to increase reps with engaging the L hand into daily tasks.                                                                                                                     Neuromuscular re-education: Facilitated L FMC/dexterity and functional reaching skills working to place ball into pegboard.  Began placing pegs with pegboard flat on table top, then transitioned to pegboard on an incline to further challenge [place].  Pt required frequent rest breaks to complete the perimeter of the board with each trial. Pt removed pegs from board using L hand, placing pegs into a container which OT held at shoulder height to facilitate forward and lateral reaching patterns.   PATIENT EDUCATION: Education details:  LUE reaching/coordination training, 2 handed sipping from cup Person educated: vc,  demo Education method: explanation, demo, vc Education comprehension: verbalized/demonstrated understanding, further training needed  Home Exercise Program:  Engaging the LUE into daily tasks, use of LUE for reaching/grasping ADL supplies  GOALS: Goals  reviewed with patient?  SHORT TERM GOALS: Target date: 02/18/2023   Pt. will demonstrate independence with HEP for LUE functioning Baseline: Eval: No current HEP; 02/06/23: Pt consistently engaging the LUE into grasping and reaching for ADL supplies that can not be damaged if dropped Goal status: ongoing  LONG TERM GOALS: Target date:  04/01/2023  Pt. will improve FOTO score by 1 point for patient perceived improvement with assessment specific ADLs, and IADLs.  Baseline: Eval: FOTO score: 59; TR score: 60; 02/06/23: 61 Goal status: achieved/ongoing  2.  Pt. will independently modulate the accuracy of holding items without overgripping when reaching for items 50% of the time Baseline: Eval: Pt. is consistently overpowering, and over gripping items when grasping them with the left hand; 02/06/23: pt reports that he can tell he's improved with ability to press his bass strings without too much force.  Continued difficulty using correct force to grasp a cup. Goal status: ongoing  3.  Pt. will improve left hand [place] skills (Revised: 9 hole in < 1 min 15 sec) (initial: by 10 sec; achieved) to improve left hand grasp on objects.  Baseline: Eval: R: 25 sec. L: 2 min. & 34 sec; 02/06/23: L 1 min 40 sec Goal status: revised  4.  Pt. Will utilize compensatory strategies for left hand function motor coordination 75% during ADLs, and IADL tasks. Baseline: Eval: Minimal use of compensatory strategies during ADLs, and IADLs; 02/06/23: Pt more consistently bearing weight into L forearm when performing [place] tasks to increase distal control. Goal status: ongoing  5.  Pt. Will use compensatory strategies for left hand function 75% of the time when utilizing  multiple body systems, dual tasking, and using the hand in a variety of different contexts. Baseline: Eval: Pt. Consistently has difficulty using his left hand to complete tasks when he is not looking directly at his hand, and has more difficulty using his left hand when standing, or attempting to walk with the walker; 02/06/23: Pt more consistently bearing weight into L forearm when performing [facility] tasks to increase distal control, occasionally will stabilize L wrist with R hand, and initiates rest breaks when LUE is fatigued.  Goal status: ongoing ASSESSMENT:   CLINICAL IMPRESSION: Pt easily frustrated today with the decreased coordination throughout the LUE.  Practiced 1 and 2 handed pouring and sipping from a cup.  Towel on lap and tabletop d/t spilling risk, which did occur.  Encouraged 2 hands to drink from cup at home to increase reps with engaging the L hand into daily tasks.  Pt continues to be challenged with ball peg pick up and placement of pegs into pegboard, frequently dropping pegs, and requiring frequent rest breaks d/t arm fatigue and frustration.  Pt continues to benefit from OT services to improve Left hand function, improve LUE ataxia, use of graded pressure with grasping, and [facility] skills in order to be able to use his hand when challenged in multiple contexts, and with dual tasking. Pt will benefit from education about compensatory strategies for left hand function during ADLs, and IADLs.    PERFORMANCE DEFICITS: in functional skills including ADLs, IADLs, coordination, dexterity, ROM, strength, Fine motor control, and Gross motor control, cognitive skills including attention, memory, problem solving, and safety awareness, and psychosocial skills including coping strategies, environmental adaptation, habits, and routines and behaviors.   IMPAIRMENTS: are limiting patient from ADLs, IADLs, play, leisure, and social participation.   CO-MORBIDITIES: may have co-morbidities  that affects  occupational performance. Patient will benefit from  skilled OT to address above impairments and improve overall function.  MODIFICATION OR ASSISTANCE TO COMPLETE EVALUATION: Min-Moderate modification of tasks or assist with assess necessary to complete an evaluation.  OT OCCUPATIONAL PROFILE AND HISTORY: Detailed assessment: Review of records and additional review of physical, cognitive, psychosocial history related to current functional performance.  CLINICAL DECISION MAKING: Moderate - several treatment options, min-mod task modification necessary  REHAB POTENTIAL: Good  EVALUATION COMPLEXITY: Moderate    PLAN:  OT FREQUENCY: 2x/week  OT DURATION: 12 weeks  PLANNED INTERVENTIONS: self care/ADL training, therapeutic exercise, therapeutic activity, patient/family education, cognitive remediation/compensation, and DME and/or AE instructions, neuromuscular re-education  RECOMMENDED OTHER SERVICES: OT services.  CONSULTED AND AGREED WITH PLAN OF CARE: Patient and family member/caregiver-wife  PLAN FOR NEXT SESSION: Initiate treatment  Leta Speller, MS, OTR/L   Darleene Cleaver, OT 02/11/2023, 11:16 AM

## 2023-02-11 NOTE — Therapy (Signed)
OUTPATIENT PHYSICAL THERAPY TREATMENT   Patient Name: Vernon [name] MRN: [id number] DOB:[birth year], 77 y.o., male Today's Date: 02/11/2023   PCP:  Vernon Hire, MD   REFERRING PROVIDER:  Baxter Hire, MD    END OF SESSION:  PT End of Session - 02/11/23 1015     Visit Number 9    Number of Visits 25    Date for PT Re-Evaluation 04/03/23    Authorization Type [employer] Medicare    Progress Note Due on Visit 10    PT Start Time 1015    PT Stop Time 1056    PT Time Calculation (min) 41 min    Equipment Utilized During Treatment Gait belt    Activity Tolerance Patient tolerated treatment well    Behavior During Therapy WFL for tasks assessed/performed               Past Medical History:  Diagnosis Date   Anemia    Aortic stenosis    Arthritis    Complication of anesthesia    hard time getting bp up after knee replacement   Coronary artery disease    Diabetes mellitus without complication (HCC)    GERD (gastroesophageal reflux disease)    occ tums prn   History of hiatal hernia    Hypertension    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 Quentin [name]   Past Surgical History:  Procedure Laterality Date   CARPAL TUNNEL RELEASE  2012   CRANIOTOMY N/A 02/10/2022   Procedure: SUBOCCIPITAL CRANIECTOMY FOR EVACUATION OF CEREBELLAR HEMATOMA;  Surgeon: Vernon Miss, MD;  Location: [facility];  Service: Neurosurgery;  Laterality: N/A;   JOINT REPLACEMENT Right 2010   LEFT ATRIAL APPENDAGE OCCLUSION N/A 07/26/2022   Procedure: LEFT ATRIAL APPENDAGE OCCLUSION;  Surgeon: Vernon Epley, MD;  Location: [facility];  Service: Cardiovascular;  Laterality: N/A;   SHOULDER ARTHROSCOPY WITH ROTATOR CUFF REPAIR AND OPEN BICEPS TENODESIS Right 11/09/2019   Procedure: RIGHT SHOULDER ARTHROSCOPY WITH SUBSCAPULARIS REPAIR, SUBACROMIAL DECOMPRESSION,MINI OPEN ROTATOR CUFF REPAIR;   Surgeon: Vernon Fabry, MD;  Location: [facility];  Service: Orthopedics;  Laterality: Right;   TEE WITHOUT CARDIOVERSION N/A 07/26/2022   Procedure: TRANSESOPHAGEAL ECHOCARDIOGRAM (TEE);  Surgeon: Vernon Epley, MD;  Location: [facility];  Service: Cardiovascular;  Laterality: N/A;   Patient Active Problem List   Diagnosis Date Noted   Urinary tract infection without hematuria 12/19/2022   Painless jaundice 12/18/2022   Elevated LFTs 12/18/2022   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   Obstructive jaundice 12/17/2022   Atrial fibrillation ([place]) 07/26/2022   Presence of Watchman left atrial appendage closure device 07/26/2022   Proteinuria, unspecified 05/23/2022   Simple renal cyst 05/23/2022   Long term current use of anticoagulant 04/04/2022   Rotator cuff syndrome 04/04/2022   Exposure to potentially hazardous substance 04/04/2022   Gout 04/04/2022   Osteoarthritis 04/04/2022   Osteoporosis 04/04/2022   Other specified diseases of hair and hair follicles 0000000   Pain in joint, lower leg 04/04/2022   Problem related to unspecified psychosocial circumstances 04/04/2022   General medical examination for administrative purposes 04/04/2022   Stage 3b chronic kidney disease ([place])    Dysphagia, post-stroke    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 hemorrhage of brain ([place]) 02/26/2022   Cough 123XX123   Acute metabolic encephalopathy 123XX123   Obstructive hydrocephalus ([place]) 02/19/2022   Dyslipidemia 02/19/2022   Dysphagia 02/19/2022  AKI (acute kidney injury) ([place])    Anemia    Pleural effusion on right    Respiratory failure requiring intubation (HCC)    Cerebral edema (HCC)    Chronic atrial fibrillation (HCC)    Controlled type 2 diabetes mellitus with hyperglycemia, without long-term current use of insulin (HCC)    ICH (intracerebral hemorrhage) ([place]) 02/10/2022   Intracranial hemorrhage (HCC)    Anticoagulated    Moderate tricuspid regurgitation 07/26/2021   Moderate aortic  valve stenosis 03/08/2020   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[place]) 02/18/2020   Macrocytic anemia 01/13/2020   Spleen enlargement 01/13/2020   Bilateral carotid artery stenosis 07/14/2018   Atrial fibrillation, chronic ([place]) 07/14/2018   Type 2 diabetes with nephropathy ([place]) 02/14/2016   Essential hypertension 08/16/2014   Hyperlipemia, mixed 08/16/2014   Renal insufficiency 08/16/2014    ONSET DATE: 02/10/2022  REFERRING DIAG:  I63.9 (ICD-10-CM) - Cerebral infarction, unspecified  I63.9 (ICD-10-CM) - CVA (cerebral vascular accident) ([place])    THERAPY DIAG:  Other lack of coordination  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 Pt hospitalized 1/22-1/25/24 for obstructive jaundice. Pt reports it was found he had a UTI with recent hospital stay. He wants to improve his endurance and balance. PMH significant for MDS, chronic a-fib, HTN, DMII, Presence of Watchman Left atrial appendage closure device, anemia, arthritis, hx of hiatal hernia, hx craniotomy 02/10/22, L atrial appendage occlusion 07/26/22, TEE without cardioversion 07/26/2022.     PAIN:  Are you having pain? Yes, 4/10 Left knee resting, worse with standing, walking, terminal  extension  PRECAUTIONS: Fall  WEIGHT BEARING RESTRICTIONS: No  FALLS: Has patient fallen in last 6 months? No  PATIENT GOALS: improve coordination, endurance and balance  OBJECTIVE:    INTERVENTION THIS DATE:   NMR:  -Step ups with 3# AW onto purple pad without UE support x 15 reps- Very unsteady- posterior lean.  -Staggered standing on purple pad  3# AW donned- hold 30 sec x 2  -Near tandem on purple pad- hold 30 sec x 2 trials each- Patient reports as "Very challenging."  - Dynamic marching on purple pad with 3# AW x 10 reps (VC to perform slowly)  -Forward/retro step over 1/2 foam in // bars with 3# AW and without UE support x 15 reps each- VC for posture and to pick feet up  -dynamic walking on airex beam with 3# AW -down and back x 4 with 1 UE support   THEREX:   - calf raises on 1/2 foam 2 sets of 10 reps with 3# AW BLE - Toe raises on 1/2 foam 2 sets of 10 reps with 3# AW BLE   PATIENT EDUCATION: Education details: exam findings, plan, testing Person educated: Patient and Spouse Education method: Explanation Education comprehension: verbalized understanding  HOME EXERCISE PROGRAM: To be initiated next 1-2 visits   GOALS: Goals reviewed with patient? Yes  SHORT TERM GOALS: Target date: 02/20/2023  Patient will be independent in home exercise program to improve strength/mobility for better functional independence with ADLs. Baseline: to be intiated Goal status: INITIAL   LONG TERM GOALS: Target date: 04/03/2023   Patient will complete five times sit to stand test in < 10 seconds indicating an increased LE strength and improved balance. Baseline: 14.89 sec Goal status: INITIAL  2.  Patient will increase 10 meter walk test to >1.18m/s as to improve gait speed for better community ambulation and to reduce fall risk. Baseline:  0.82 m/s Goal status: INITIAL  3.  Patient will increase Berg Balance score by > 6 points to demonstrate decreased fall risk during  functional activities. Baseline: 39/56 Goal status: INITIAL  4.  Patient will increase six minute walk test distance to >1000 for progression to community ambulator and improve gait ability Baseline: 795' Goal status: INITIAL  5.  Patient will demonstrate improved function as evidenced by a score of 62 on FOTO measure for full participation in activities at home and in the community. Baseline: 59 Goal status: INITIAL  ASSESSMENT:  CLINICAL IMPRESSION: Continued with current plan of care for balance and LE strengthening. Patient was responsive to all VC and visual demo- also good to identify if any exercise bothered his knee and able to modify or complete without compromising activity. Recovery intervals continued to be provided as needed based on signs of exertion and/or pt request. Continued to  maximize patient independence when appropriate by using bars to make him feel safe to try more complicated balance tasks.  Pt closely monitored throughout session for safe activity response, as well as to maximize patient safety during interventions.  Pt will continue to benefit from skilled PT services to address deficits and impairment identified in evaluation in order to maximize independence and safety in basic mobility required for performance of ADL, IADL, and leisure.     OBJECTIVE IMPAIRMENTS: Abnormal gait, decreased activity tolerance, decreased balance, decreased coordination, decreased endurance, decreased knowledge of use of DME, decreased mobility, difficulty walking, decreased strength, impaired UE functional use, improper body mechanics, postural dysfunction, and pain.   ACTIVITY LIMITATIONS: carrying, lifting, bending, standing, squatting, stairs, transfers, and locomotion level  PARTICIPATION LIMITATIONS: meal prep, cleaning, laundry, driving, shopping, community activity, and yard work  PERSONAL FACTORS: Age, Past/current experiences, Time since onset of  injury/illness/exacerbation, and 3+ comorbidities: PMH significant for MDS, chronic a-fib, HTN, DMII, Presence of Watchman Left atrial appendage closure device, anemia, arthritis, hx of hiatal hernia, hx craniotomy 02/10/22, L  atrial appendage occlusion 07/26/22, TEE without cardioversion 07/26/2022  are also affecting patient's functional outcome.   REHAB POTENTIAL: Good  CLINICAL DECISION MAKING: Evolving/moderate complexity  EVALUATION COMPLEXITY: Moderate  PLAN:  PT FREQUENCY: 2x/week  PT DURATION: 12 weeks  PLANNED INTERVENTIONS: Therapeutic exercises, Therapeutic activity, Neuromuscular re-education, Balance training, Gait training, Patient/Family education, Self Care, Joint mobilization, Stair training, Vestibular training, Canalith repositioning, Visual/preceptual remediation/compensation, Orthotic/Fit training, DME instructions, Electrical stimulation, Wheelchair mobility training, Spinal mobilization, Cryotherapy, Moist heat, Splintting, Taping, Ultrasound, Manual therapy, and Re-evaluation  PLAN FOR NEXT SESSION:  Continue with strengthening exercises and balance related tasks as tolerated given his left knee DJD limitations  12:24 PM, 02/11/23 Ollen Bowl, PT Physical Therapist - [place] [phone]     02/11/23, 12:24 PM

## 2023-02-13 ENCOUNTER — Ambulatory Visit: Payer: Medicare Other

## 2023-02-13 DIAGNOSIS — R2681 Unsteadiness on feet: Secondary | ICD-10-CM

## 2023-02-13 DIAGNOSIS — R278 Other lack of coordination: Secondary | ICD-10-CM

## 2023-02-13 DIAGNOSIS — M6281 Muscle weakness (generalized): Secondary | ICD-10-CM

## 2023-02-13 DIAGNOSIS — R262 Difficulty in walking, not elsewhere classified: Secondary | ICD-10-CM

## 2023-02-13 DIAGNOSIS — R2689 Other abnormalities of gait and mobility: Secondary | ICD-10-CM

## 2023-02-13 DIAGNOSIS — I69254 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: Secondary | ICD-10-CM

## 2023-02-13 NOTE — Therapy (Signed)
OUTPATIENT PHYSICAL THERAPY TREATMENT/Physical Therapy Progress Note   Dates of reporting period  01/09/2023   to   02/13/2023   Patient Name: Vernon [name] MRN: [id number] DOB:May 01, 1946, 77 y.o., male Today's Date: 02/13/2023   PCP:  Vernon Hire, MD   REFERRING PROVIDER:  Baxter Hire, MD    END OF SESSION:  PT End of Session - 02/13/23 1021     Visit Number 10    Number of Visits 25    Date for PT Re-Evaluation 04/03/23    Authorization Type UHC Medicare    Progress Note Due on Visit 110    PT Start Time 1018    PT Stop Time 1058    PT Time Calculation (min) 40 min    Equipment Utilized During Treatment Gait belt    Activity Tolerance Patient tolerated treatment well    Behavior During Therapy WFL for tasks assessed/performed               Past Medical History:  Diagnosis Date   Anemia    Aortic stenosis    Arthritis    Complication of anesthesia    hard time getting bp up after knee replacement   Coronary artery disease    Diabetes mellitus without complication (HCC)    GERD (gastroesophageal reflux disease)    occ tums prn   History of hiatal hernia    Hypertension    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 Quentin [name]   Past Surgical History:  Procedure Laterality Date   CARPAL TUNNEL RELEASE  2012   CRANIOTOMY N/A 02/10/2022   Procedure: SUBOCCIPITAL CRANIECTOMY FOR EVACUATION OF CEREBELLAR HEMATOMA;  Surgeon: Vernon Miss, MD;  Location: [facility];  Service: Neurosurgery;  Laterality: N/A;   JOINT REPLACEMENT Right 2010   LEFT ATRIAL APPENDAGE OCCLUSION N/A 07/26/2022   Procedure: LEFT ATRIAL APPENDAGE OCCLUSION;  Surgeon: Vernon Epley, MD;  Location: [facility];  Service: Cardiovascular;  Laterality: N/A;   SHOULDER ARTHROSCOPY WITH ROTATOR CUFF REPAIR AND OPEN BICEPS TENODESIS Right 11/09/2019   Procedure: RIGHT SHOULDER ARTHROSCOPY  WITH SUBSCAPULARIS REPAIR, SUBACROMIAL DECOMPRESSION,MINI OPEN ROTATOR CUFF REPAIR;  Surgeon: Vernon Fabry, MD;  Location: [facility];  Service: Orthopedics;  Laterality: Right;   TEE WITHOUT CARDIOVERSION N/A 07/26/2022   Procedure: TRANSESOPHAGEAL ECHOCARDIOGRAM (TEE);  Surgeon: Vernon Epley, MD;  Location: [facility];  Service: Cardiovascular;  Laterality: N/A;   Patient Active Problem List   Diagnosis Date Noted   Urinary tract infection without hematuria 12/19/2022   Painless jaundice 12/18/2022   Elevated LFTs 12/18/2022   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   Obstructive jaundice 12/17/2022   Atrial fibrillation ([place]) 07/26/2022   Presence of Watchman left atrial appendage closure device 07/26/2022   Proteinuria, unspecified 05/23/2022   Simple renal cyst 05/23/2022   Long term current use of anticoagulant 04/04/2022   Rotator cuff syndrome 04/04/2022   Exposure to potentially hazardous substance 04/04/2022   Gout 04/04/2022   Osteoarthritis 04/04/2022   Osteoporosis 04/04/2022   Other specified diseases of hair and hair follicles 73/22/0254   Pain in joint, lower leg 04/04/2022   Problem related to unspecified psychosocial circumstances 04/04/2022   General medical examination for administrative purposes 04/04/2022   Stage 3b chronic kidney disease ([place])    Dysphagia, post-stroke    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 hemorrhage of brain ([place]) 02/26/2022   Cough 27/04/2375   Acute metabolic encephalopathy  02/20/2022   Obstructive hydrocephalus ([place]) 02/19/2022   Dyslipidemia 02/19/2022   Dysphagia 02/19/2022   AKI (acute kidney injury) ([place])    Anemia    Pleural effusion on right    Respiratory failure requiring intubation (HCC)    Cerebral edema (HCC)    Chronic atrial fibrillation ([place])    Controlled type 2 diabetes mellitus with hyperglycemia, without long-term current use of insulin (HCC)    ICH (intracerebral hemorrhage) ([place]) 02/10/2022   Intracranial hemorrhage (HCC)     Anticoagulated    Moderate tricuspid regurgitation 07/26/2021   Moderate aortic valve stenosis 03/08/2020   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[place]) 02/18/2020   Macrocytic anemia 01/13/2020   Spleen enlargement 01/13/2020   Bilateral carotid artery stenosis 07/14/2018   Atrial fibrillation, chronic ([place]) 07/14/2018   Type 2 diabetes with nephropathy ([place]) 02/14/2016   Essential hypertension 08/16/2014   Hyperlipemia, mixed 08/16/2014   Renal insufficiency 08/16/2014    ONSET DATE: 02/10/2022  REFERRING DIAG:  I63.9 (ICD-10-CM) - Cerebral infarction, unspecified  I63.9 (ICD-10-CM) - CVA (cerebral vascular accident) ([place])    THERAPY DIAG:  Muscle weakness (generalized)  Other lack of coordination  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 Pt hospitalized 1/22-1/25/24 for obstructive jaundice. Pt reports it was found he had a UTI with recent hospital stay. He wants to improve his endurance and balance. PMH significant for MDS, chronic a-fib, HTN, DMII, Presence of Watchman Left atrial appendage closure device, anemia, arthritis, hx of hiatal hernia, hx craniotomy 02/10/22, L atrial appendage occlusion 07/26/22, TEE without cardioversion 07/26/2022.     PAIN:  Are you having pain? Yes, 4/10 Left knee  resting, worse with standing, walking, terminal extension  PRECAUTIONS: Fall  WEIGHT BEARING RESTRICTIONS: No  FALLS: Has patient fallen in last 6 months? No  PATIENT GOALS: improve coordination, endurance and balance  OBJECTIVE:    INTERVENTION THIS DATE:    Physical therapy treatment session today consisted of completing assessment of goals and administration  of testing as demonstrated and documented in flow sheet, treatment, and goals section of this note. Addition treatments may be found below.    NMR:  -Step ups with 3# AW onto purple pad without UE support x 15 reps- Very unsteady- posterior lean.  -Staggered standing on purple pad 3# AW donned- hold 30 sec x 2  -Near tandem on purple pad- hold 30 sec x 2 trials each- Patient reports as "Very challenging."  - Dynamic marching on purple pad with 3# AW x 10 reps (VC to perform slowly)  -Forward/retro step over 1/2 foam in // bars with 3# AW and without UE support x 15 reps each- VC for posture and to pick feet up  -dynamic walking on airex beam with 3# AW -down and back x 4 with 1 UE support   THEREX:   - calf raises on 1/2 foam 2 sets of 10 reps with 3# AW BLE - Toe raises on 1/2 foam 2 sets of 10 reps with 3# AW BLE   PATIENT EDUCATION: Education details: exam findings, plan, testing Person educated: Patient and Spouse Education method: Explanation Education comprehension: verbalized understanding  HOME EXERCISE PROGRAM: To be initiated next 1-2 visits   GOALS: Goals reviewed with patient? Yes  SHORT TERM GOALS: Target date: 02/20/2023  Patient will be independent in home exercise program to improve strength/mobility for better functional independence with ADLs. Baseline: to be intiated; 02/13/2023= Patient reports no issues with current HEP.  Goal status: MET   LONG TERM GOALS: Target date: 04/03/2023   Patient will complete five times sit to stand test in < 10 seconds indicating an increased LE strength and  improved balance. Baseline: 14.89 sec; 02/13/2023= 11.99 without UE Support Goal status: Progressing  2.  Patient will increase 10 meter walk test to >1.45m/s as to improve gait speed for better community ambulation and to reduce fall risk. Baseline:  0.82 m/s; 02/13/2023= 1.03 m/s with RW Goal status: MET  3.  Patient will increase Berg Balance score by > 6 points to demonstrate decreased fall risk during functional activities. Baseline: 39/56; 02/13/2023=41/56 Goal status: Progressing  4.  Patient will increase six minute walk test distance to >1000 for progression to community ambulator and improve gait ability Baseline: 795'; 02/13/2023= 890 feet with RW and 2 brief standing rest breaks Goal status: Progressing  5.  Patient will demonstrate improved function as evidenced by a score of 62 on FOTO measure for full participation in activities at home and in the community. Baseline: 59; 02/13/2023=62 Goal status: Progressing  ASSESSMENT:  CLINICAL IMPRESSION: Treatment focused on reassessing goals for progress note visit #10. He was continuously monitored throughout session for safe activity response, as well as to maximize patient safety during interventions especially with walking distances and balance test. He was able to demonstrate steady progress so far improving in each of his goals- improved balance score by 2 points, much improved gait speed using RW with no report of falling. Patient's condition has the potential to improve in response to therapy. Maximum improvement is yet to be obtained. The anticipated improvement is attainable and reasonable in a generally predictable time.  Patient reports    Pt will continue to benefit from skilled PT services to address deficits and impairment identified in evaluation in order to maximize independence and safety in basic mobility required for performance of ADL, IADL, and leisure.     OBJECTIVE IMPAIRMENTS: Abnormal gait, decreased activity  tolerance, decreased balance, decreased coordination, decreased endurance, decreased  knowledge of use of DME, decreased mobility, difficulty walking, decreased strength, impaired UE functional use, improper body mechanics, postural dysfunction, and pain.   ACTIVITY LIMITATIONS: carrying, lifting, bending, standing, squatting, stairs, transfers, and locomotion level  PARTICIPATION LIMITATIONS: meal prep, cleaning, laundry, driving, shopping, community activity, and yard work  PERSONAL FACTORS: Age, Past/current experiences, Time since onset of injury/illness/exacerbation, and 3+ comorbidities: PMH significant for MDS, chronic a-fib, HTN, DMII, Presence of Watchman Left atrial appendage closure device, anemia, arthritis, hx of hiatal hernia, hx craniotomy 02/10/22, L atrial appendage occlusion 07/26/22, TEE without cardioversion 07/26/2022  are also affecting patient's functional outcome.   REHAB POTENTIAL: Good  CLINICAL DECISION MAKING: Evolving/moderate complexity  EVALUATION COMPLEXITY: Moderate  PLAN:  PT FREQUENCY: 2x/week  PT DURATION: 12 weeks  PLANNED INTERVENTIONS: Therapeutic exercises, Therapeutic activity, Neuromuscular re-education, Balance training, Gait training, Patient/Family education, Self Care, Joint mobilization, Stair training, Vestibular training, Canalith repositioning, Visual/preceptual remediation/compensation, Orthotic/Fit training, DME instructions, Electrical stimulation, Wheelchair mobility training, Spinal mobilization, Cryotherapy, Moist heat, Splintting, Taping, Ultrasound, Manual therapy, and Re-evaluation  PLAN FOR NEXT SESSION:  Continue with strengthening exercises and balance related tasks as tolerated given his left knee DJD limitations  1:23 PM, 02/13/23 Ollen Bowl, PT Physical Therapist - [place] [phone]     02/13/23, 1:23 PM

## 2023-02-17 NOTE — Therapy (Signed)
OUTPATIENT OCCUPATIONAL THERAPY NEURO TREATMENT NOTE   Patient Name: Vernon [name] MRN: [id number] DOB:[birth year], 77 y.o., male Today's Date: 02/17/2023  REFERRING PROVIDER:  Harrel Lemon, MD  END OF SESSION:  OT End of Session - 02/17/23 0909     Visit Number 12    Number of Visits 24    Date for OT Re-Evaluation 04/01/23    Authorization Time Period Progress reporting period starting 01/07/2023-02/06/23    Progress Note Due on Visit 10    OT Start Time 1100    OT Stop Time 1145    OT Time Calculation (min) 45 min    Equipment Utilized During Treatment transport chair    Activity Tolerance Patient tolerated treatment well    Behavior During Therapy WFL for tasks assessed/performed             Past Medical History:  Diagnosis Date   Anemia    Aortic stenosis    Arthritis    Complication of anesthesia    hard time getting bp up after knee replacement   Coronary artery disease    Diabetes mellitus without complication (HCC)    GERD (gastroesophageal reflux disease)    occ tums prn   History of hiatal hernia    Hypertension    MDS (myelodysplastic syndrome) ([place])    Presence of Watchman left atrial appendage closure device 07/26/2022   1mm Watchman FLX with Dr. Quentin Ore   Past Surgical History:  Procedure Laterality Date   CARPAL TUNNEL RELEASE  2012   CRANIOTOMY N/A 02/10/2022   Procedure: SUBOCCIPITAL CRANIECTOMY FOR EVACUATION OF CEREBELLAR HEMATOMA;  Surgeon: Kristeen Miss, MD;  Location: [facility];  Service: Neurosurgery;  Laterality: N/A;   JOINT REPLACEMENT Right 2010   LEFT ATRIAL APPENDAGE OCCLUSION N/A 07/26/2022   Procedure: LEFT ATRIAL APPENDAGE OCCLUSION;  Surgeon: Vickie Epley, MD;  Location: [facility];  Service: Cardiovascular;  Laterality: N/A;   SHOULDER ARTHROSCOPY WITH ROTATOR CUFF REPAIR AND OPEN BICEPS TENODESIS Right 11/09/2019   Procedure: RIGHT SHOULDER ARTHROSCOPY WITH SUBSCAPULARIS REPAIR, SUBACROMIAL DECOMPRESSION,MINI OPEN  ROTATOR CUFF REPAIR;  Surgeon: Leim Fabry, MD;  Location: [facility];  Service: Orthopedics;  Laterality: Right;   TEE WITHOUT CARDIOVERSION N/A 07/26/2022   Procedure: TRANSESOPHAGEAL ECHOCARDIOGRAM (TEE);  Surgeon: Vickie Epley, MD;  Location: [facility];  Service: Cardiovascular;  Laterality: N/A;   Patient Active Problem List   Diagnosis Date Noted   Urinary tract infection without hematuria 12/19/2022   Painless jaundice 12/18/2022   Elevated LFTs 12/18/2022   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   Obstructive jaundice 12/17/2022   Atrial fibrillation ([place]) 07/26/2022   Presence of Watchman left atrial appendage closure device 07/26/2022   Proteinuria, unspecified 05/23/2022   Simple renal cyst 05/23/2022   Long term current use of anticoagulant 04/04/2022   Rotator cuff syndrome 04/04/2022   Exposure to potentially hazardous substance 04/04/2022   Gout 04/04/2022   Osteoarthritis 04/04/2022   Osteoporosis 04/04/2022   Other specified diseases of hair and hair follicles 0000000   Pain in joint, lower leg 04/04/2022   Problem related to unspecified psychosocial circumstances 04/04/2022   General medical examination for administrative purposes 04/04/2022   Stage 3b chronic kidney disease ([place])    Dysphagia, post-stroke    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 hemorrhage of brain ([place]) 02/26/2022   Cough 123XX123   Acute metabolic encephalopathy 123XX123   Obstructive hydrocephalus ([place]) 02/19/2022   Dyslipidemia 02/19/2022   Dysphagia 02/19/2022   AKI (acute kidney injury) ([place])  Anemia    Pleural effusion on right    Respiratory failure requiring intubation (HCC)    Cerebral edema (HCC)    Chronic atrial fibrillation (HCC)    Controlled type 2 diabetes mellitus with hyperglycemia, without long-term current use of insulin (HCC)    ICH (intracerebral hemorrhage) ([place]) 02/10/2022   Intracranial hemorrhage (HCC)    Anticoagulated    Moderate tricuspid regurgitation 07/26/2021    Moderate aortic valve stenosis 03/08/2020   MDS (myelodysplastic syndrome) ([place]) 02/18/2020   Macrocytic anemia 01/13/2020   Spleen enlargement 01/13/2020   Bilateral carotid artery stenosis 07/14/2018   Atrial fibrillation, chronic ([place]) 07/14/2018   Type 2 diabetes with nephropathy ([place]) 02/14/2016   Essential hypertension 08/16/2014   Hyperlipemia, mixed 08/16/2014   Renal insufficiency 08/16/2014    ONSET DATE: 02/10/2022  REFERRING DIAG:  CVA  THERAPY DIAG:  Coordination Muscle weakness (generalized)  Other lack of coordination  Rationale for Evaluation and Treatment: Rehabilitation  SUBJECTIVE:  SUBJECTIVE STATEMENT: Pt reported that he went out to eat with friends over the weekend and was happy that he didn't drop or spill anything at the table, though he reports making sure to move items that were near L arm. Pt accompanied by: significant other  PERTINENT HISTORY: Pt. is a 1 y. 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 Pt. received outpt. OT services following the CVA for Left sided weakness, and incoordination. Pt. was recently hospitalized from 1/22-1/25/24 for Obstructive Jaundice. PMHx includes: MDS, Chronic A-Fib, Essential HTN, Type II DM, Dyslipidemia, Presence of Watchman Left atrial appendage closure device, GERD  PRECAUTIONS: None  WEIGHT BEARING RESTRICTIONS: No  PAIN:  Are you having pain? L knee 2/10 pain achy; rest improves pain, walking worsens pain  FALLS: Has patient fallen in last 6 months? No  LIVING ENVIRONMENT: Lives with: lives with their spouse Lives in: House/apartment one storey, can fully live on the 1st floor. Stairs: Ramped entrance  Has following equipment at home: 4 wheeled walker, w/c-manual, grab bars, shower chair, HH shower head, toilet riser with arms.  PLOF: Independent  PATIENT GOALS: To improve arm and hand function   OBJECTIVE:   HAND  DOMINANCE: Right  ADLs: Transfers/ambulation related to ADLs: Eating: Difficulty with motor control control Grooming:  Independent UB Dressing:  Diffiulty buttoning when no looking directly at the buttons.  LB Dressing: Independent donning pants, socks, and slide on shoes. Toileting: Independent Bathing: Independent, wife assists with towel. Tub Shower transfers: Modified Independence  in the shower, CGA transfers out of shoer.   IADLs: Shopping:  Pt.'s wife performs, occasionally uses grocery store scooters Light housekeeping: Wife performs housekeeping tasks. Meal Prep: Prepares light snacks, and light meal prep Community mobility: Relies on family and friends Medication management: Independent Financial management: Does  with wife; No changes in how they are doing them Handwriting: 100% legible for name: Wife has noticed some changes with writing  Hobbies: Plays the guitar, workshop, music shop.  MOBILITY STATUS: Needs Assist: uses a rollator  POSTURE COMMENTS:  No Significant postural limitations Sitting balance: WFL  ACTIVITY TOLERANCE: Activity tolerance:  Limited  FUNCTIONAL OUTCOME MEASURES:  FOTO: 59; TR score: 60 02/06/23: 61  UPPER EXTREMITY ROM:    Active ROM Right Eval  Left Eval [facility]  Shoulder flexion 106   Shoulder abduction 85   Shoulder adduction    Shoulder extension    Shoulder internal rotation    Shoulder external rotation  Elbow flexion WFL   Elbow extension WFL   Wrist flexion    Wrist extension WFL   Wrist ulnar deviation    Wrist radial deviation    Wrist pronation    Wrist supination WFL   (Blank rows = not tested)  Digit flexion to the [facility]:  2nd: 0cm, 3rd: 1.5cm, 4th: 2.5cm, 5th: 0cm   UPPER EXTREMITY MMT:     MMT Right eval Left eval  Shoulder flexion 3- 4  Shoulder abduction 3- 4  Shoulder adduction    Shoulder extension    Shoulder internal rotation    Shoulder external rotation    Middle trapezius    Lower  trapezius    Elbow flexion 4 4  Elbow extension 4 4  Wrist flexion    Wrist extension 4 4  Wrist ulnar deviation    Wrist radial deviation    Wrist pronation    Wrist supination    (Blank rows = not tested)  HAND FUNCTION: Grip strength: N/A secondary to arthritis, and 3rd digit trigger finger    Lateral Pinch: R: 23#, L: 20#  3 Pt. Pinch: R: 22#, L: 21# 02/06/23: Lateral Pinch: R: 22#, L: 20#  3 Pt. Pinch: R: 22#, L: 17#  COORDINATION: 9 Hole Peg test: Right: 25 sec; Left: 2 min. & 34  sec 02/06/23: 9 Hole Peg test: Left: 1 min. & 40 sec  SENSATION: WFL Light touch: WFL Stereognosis: WFL  EDEMA:   N/A  MUSCLE TONE:  Intact  COGNITION: Overall cognitive status: Within functional limits for tasks assessed  VISION: Subjective report: Pt. reports double vision initially at the onset of the CVA with difficulty scanning to left when reading. Pt. Reports this has improved, and the double vision has resolved.   PERCEPTION: WFL  PRAXIS: Impaired motor control, and coordination   TODAY'S TREATMENT:          Self Care: Participated in coin manipulation activities with coin pick up from table, storage of coins within palm of hand, transferring coins from palm of hand to fingertips to enable discarding coins one at a time without dropping into a resistive slotted bank.                                                                Neuromuscular re-education: Facilitated L FMC/dexterity and functional reaching skills working to place [employer] into pegboard which was placed on an incline wedge.  Pt practiced rotating pegs dot side up to dot side down to facilitate dexterity skills by repositioning items within thumb and fingertips.   PATIENT EDUCATION: Education details:  LUE reaching/coordination training, 2 handed sipping from cup Person educated: vc, Electronics engineer: explanation, demo, vc Education comprehension: verbalized/demonstrated understanding, further  training needed  Home Exercise Program:  Engaging the LUE into daily tasks, use of LUE for reaching/grasping ADL supplies  GOALS: Goals reviewed with patient?  SHORT TERM GOALS: Target date: 02/18/2023   Pt. will demonstrate independence with HEP for LUE functioning Baseline: Eval: No current HEP; 02/06/23: Pt consistently engaging the LUE into grasping and reaching for ADL supplies that can not be damaged if dropped Goal status: ongoing  LONG TERM GOALS: Target date:  04/01/2023  Pt. will improve FOTO score by 1 point for patient  perceived improvement with assessment specific ADLs, and IADLs.  Baseline: Eval: FOTO score: 59; TR score: 60; 02/06/23: 61 Goal status: achieved/ongoing  2.  Pt. will independently modulate the accuracy of holding items without overgripping when reaching for items 50% of the time Baseline: Eval: Pt. is consistently overpowering, and over gripping items when grasping them with the left hand; 02/06/23: pt reports that he can tell he's improved with ability to press his bass strings without too much force.  Continued difficulty using correct force to grasp a cup. Goal status: ongoing  3.  Pt. will improve left hand [facility] skills (Revised: 9 hole in < 1 min 15 sec) (initial: by 10 sec; achieved) to improve left hand grasp on objects.  Baseline: Eval: R: 25 sec. L: 2 min. & 34 sec; 02/06/23: L 1 min 40 sec Goal status: revised  4.  Pt. Will utilize compensatory strategies for left hand function motor coordination 75% during ADLs, and IADL tasks. Baseline: Eval: Minimal use of compensatory strategies during ADLs, and IADLs; 02/06/23: Pt more consistently bearing weight into L forearm when performing [place] tasks to increase distal control. Goal status: ongoing  5.  Pt. Will use compensatory strategies for left hand function 75% of the time when utilizing multiple body systems, dual tasking, and using the hand in a variety of different contexts. Baseline: Eval: Pt.  Consistently has difficulty using his left hand to complete tasks when he is not looking directly at his hand, and has more difficulty using his left hand when standing, or attempting to walk with the walker; 02/06/23: Pt more consistently bearing weight into L forearm when performing [facility] tasks to increase distal control, occasionally will stabilize L wrist with R hand, and initiates rest breaks when LUE is fatigued.  Goal status: ongoing ASSESSMENT:   CLINICAL IMPRESSION: Pt reported that he went out to eat with friends over the weekend and was happy that he didn't drop or spill anything at the table, though he reports making sure to move items that were near L arm.  Pt continues to focus on LUE gross and [facility] skills as he continues to struggles significantly when arm is not supported.  Control is improved, though still impaired, when pt is able to rest elbow and forearm on support surface.  Pt continues to benefit from OT services to improve Left hand function, improve LUE ataxia, use of graded pressure with grasping, and [facility] skills in order to be able to use his hand when challenged in multiple contexts, and with dual tasking. Pt will benefit from education about compensatory strategies for left hand function during ADLs, and IADLs.    PERFORMANCE DEFICITS: in functional skills including ADLs, IADLs, coordination, dexterity, ROM, strength, Fine motor control, and Gross motor control, cognitive skills including attention, memory, problem solving, and safety awareness, and psychosocial skills including coping strategies, environmental adaptation, habits, and routines and behaviors.   IMPAIRMENTS: are limiting patient from ADLs, IADLs, play, leisure, and social participation.   CO-MORBIDITIES: may have co-morbidities  that affects occupational performance. Patient will benefit from skilled OT to address above impairments and improve overall function.  MODIFICATION OR ASSISTANCE TO COMPLETE EVALUATION:  Min-Moderate modification of tasks or assist with assess necessary to complete an evaluation.  OT OCCUPATIONAL PROFILE AND HISTORY: Detailed assessment: Review of records and additional review of physical, cognitive, psychosocial history related to current functional performance.  CLINICAL DECISION MAKING: Moderate - several treatment options, min-mod task modification necessary  REHAB POTENTIAL: Good  EVALUATION COMPLEXITY:  Moderate    PLAN:  OT FREQUENCY: 2x/week  OT DURATION: 12 weeks  PLANNED INTERVENTIONS: self care/ADL training, therapeutic exercise, therapeutic activity, patient/family education, cognitive remediation/compensation, and DME and/or AE instructions, neuromuscular re-education  RECOMMENDED OTHER SERVICES: OT services.  CONSULTED AND AGREED WITH PLAN OF CARE: Patient and family member/caregiver-wife  PLAN FOR NEXT SESSION: Initiate treatment  Leta Speller, MS, OTR/L   Darleene Cleaver, OT 02/17/2023, 9:10 AM

## 2023-02-18 ENCOUNTER — Ambulatory Visit (HOSPITAL_COMMUNITY): Payer: Medicare Other | Attending: Cardiology

## 2023-02-18 ENCOUNTER — Ambulatory Visit: Payer: Medicare Other

## 2023-02-18 ENCOUNTER — Ambulatory Visit: Payer: Medicare Other | Admitting: Physical Therapy

## 2023-02-18 DIAGNOSIS — M6281 Muscle weakness (generalized): Secondary | ICD-10-CM

## 2023-02-18 DIAGNOSIS — R278 Other lack of coordination: Secondary | ICD-10-CM

## 2023-02-18 DIAGNOSIS — R2681 Unsteadiness on feet: Secondary | ICD-10-CM

## 2023-02-18 DIAGNOSIS — R262 Difficulty in walking, not elsewhere classified: Secondary | ICD-10-CM

## 2023-02-18 DIAGNOSIS — R0602 Shortness of breath: Secondary | ICD-10-CM | POA: Diagnosis not present

## 2023-02-18 DIAGNOSIS — I2510 Atherosclerotic heart disease of native coronary artery without angina pectoris: Secondary | ICD-10-CM | POA: Insufficient documentation

## 2023-02-18 DIAGNOSIS — R471 Dysarthria and anarthria: Secondary | ICD-10-CM

## 2023-02-18 DIAGNOSIS — R2689 Other abnormalities of gait and mobility: Secondary | ICD-10-CM

## 2023-02-18 DIAGNOSIS — I69254 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: Secondary | ICD-10-CM

## 2023-02-18 LAB — ECHOCARDIOGRAM COMPLETE
AR max vel: 0.98 cm2
AV Area VTI: 0.87 cm2
AV Area mean vel: 0.97 cm2
AV Mean grad: 20 mmHg
AV Peak grad: 37.9 mmHg
Ao pk vel: 3.08 m/s
Area-P 1/2: 4.33 cm2
MV M vel: 5.68 m/s
MV Peak grad: 129 mmHg
P 1/2 time: 674 msec
Radius: 0.8 cm
S' Lateral: 4.3 cm

## 2023-02-18 NOTE — Progress Notes (Signed)
Echo shows elevated pressures. Aortic stenosis is moderate. I performed a calcium score of his aortic valve on his chest CT from 09/2022. Value 1324 consistent with moderate AS. I want him to take lasix daily. I will send a my chart message.   [place] Bells T. Audie Box, MD, [place]  [address], [place], [place] [address] [phone]  9:04 PM

## 2023-02-18 NOTE — Therapy (Signed)
OUTPATIENT PHYSICAL THERAPY TREATMENT/Physical Therapy Progress Note   Dates of reporting period  01/09/2023   to   02/13/2023   Patient Name: Vernon [name] MRN: [id number] DOB:[birth year], 77 y.o., male Today's Date: 02/18/2023   PCP:  Baxter Hire, MD   REFERRING PROVIDER:  Baxter Hire, MD    END OF SESSION:  PT End of Session - 02/18/23 1520     Visit Number 11    Number of Visits 25    Date for PT Re-Evaluation 04/03/23    Authorization Type [employer]    Progress Note Due on Visit 57    PT Start Time 1518    PT Stop Time 1601    PT Time Calculation (min) 43 min    Equipment Utilized During Treatment Gait belt    Activity Tolerance Patient tolerated treatment well    Behavior During Therapy WFL for tasks assessed/performed               Past Medical History:  Diagnosis Date   Anemia    Aortic stenosis    Arthritis    Complication of anesthesia    hard time getting bp up after knee replacement   Coronary artery disease    Diabetes mellitus without complication (HCC)    GERD (gastroesophageal reflux disease)    occ tums prn   History of hiatal hernia    Hypertension    MDS (myelodysplastic syndrome) ([place])    Presence of Watchman left atrial appendage closure device 07/26/2022   33mm Watchman FLX with Dr. Quentin Ore   Past Surgical History:  Procedure Laterality Date   CARPAL TUNNEL RELEASE  2012   CRANIOTOMY N/A 02/10/2022   Procedure: SUBOCCIPITAL CRANIECTOMY FOR EVACUATION OF CEREBELLAR HEMATOMA;  Surgeon: Kristeen Miss, MD;  Location: [facility];  Service: Neurosurgery;  Laterality: N/A;   JOINT REPLACEMENT Right 2010   LEFT ATRIAL APPENDAGE OCCLUSION N/A 07/26/2022   Procedure: LEFT ATRIAL APPENDAGE OCCLUSION;  Surgeon: Vickie Epley, MD;  Location: [facility];  Service: Cardiovascular;  Laterality: N/A;   SHOULDER ARTHROSCOPY WITH ROTATOR CUFF REPAIR AND OPEN BICEPS TENODESIS Right 11/09/2019   Procedure: RIGHT SHOULDER ARTHROSCOPY  WITH SUBSCAPULARIS REPAIR, SUBACROMIAL DECOMPRESSION,MINI OPEN ROTATOR CUFF REPAIR;  Surgeon: Leim Fabry, MD;  Location: [facility];  Service: Orthopedics;  Laterality: Right;   TEE WITHOUT CARDIOVERSION N/A 07/26/2022   Procedure: TRANSESOPHAGEAL ECHOCARDIOGRAM (TEE);  Surgeon: Vickie Epley, MD;  Location: [facility];  Service: Cardiovascular;  Laterality: N/A;   Patient Active Problem List   Diagnosis Date Noted   Urinary tract infection without hematuria 12/19/2022   Painless jaundice 12/18/2022   Elevated LFTs 12/18/2022   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   Obstructive jaundice 12/17/2022   Atrial fibrillation ([place]) 07/26/2022   Presence of Watchman left atrial appendage closure device 07/26/2022   Proteinuria, unspecified 05/23/2022   Simple renal cyst 05/23/2022   Long term current use of anticoagulant 04/04/2022   Rotator cuff syndrome 04/04/2022   Exposure to potentially hazardous substance 04/04/2022   Gout 04/04/2022   Osteoarthritis 04/04/2022   Osteoporosis 04/04/2022   Other specified diseases of hair and hair follicles 0000000   Pain in joint, lower leg 04/04/2022   Problem related to unspecified psychosocial circumstances 04/04/2022   General medical examination for administrative purposes 04/04/2022   Stage 3b chronic kidney disease ([place])    Dysphagia, post-stroke    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 hemorrhage of brain ([place]) 02/26/2022   Cough [id number]   Acute metabolic encephalopathy  02/20/2022   Obstructive hydrocephalus ([place]) 02/19/2022   Dyslipidemia 02/19/2022   Dysphagia 02/19/2022   AKI (acute kidney injury) ([place])    Anemia    Pleural effusion on right    Respiratory failure requiring intubation (HCC)    Cerebral edema (HCC)    Chronic atrial fibrillation ([place])    Controlled type 2 diabetes mellitus with hyperglycemia, without long-term current use of insulin (HCC)    ICH (intracerebral hemorrhage) ([place]) 02/10/2022   Intracranial hemorrhage (HCC)     Anticoagulated    Moderate tricuspid regurgitation 07/26/2021   Moderate aortic valve stenosis 03/08/2020   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[place]) 02/18/2020   Macrocytic anemia 01/13/2020   Spleen enlargement 01/13/2020   Bilateral carotid artery stenosis 07/14/2018   Atrial fibrillation, chronic ([place]) 07/14/2018   Type 2 diabetes with nephropathy ([place]) 02/14/2016   Essential hypertension 08/16/2014   Hyperlipemia, mixed 08/16/2014   Renal insufficiency 08/16/2014    ONSET DATE: 02/10/2022  REFERRING DIAG:  I63.9 (ICD-10-CM) - Cerebral infarction, unspecified  I63.9 (ICD-10-CM) - CVA (cerebral vascular accident) ([place])    THERAPY DIAG:  Unsteadiness on feet  Muscle weakness (generalized)  Other lack of coordination  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 Reports that he saw Cardiologist this AM; mild nausea  this PM from taking heart medication.    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 Pt hospitalized 1/22-1/25/24 for obstructive jaundice. Pt reports it was found he had a UTI with recent hospital stay. He wants to improve his endurance and balance. PMH significant for MDS, chronic a-fib, HTN, DMII, Presence of Watchman Left atrial appendage closure device, anemia, arthritis, hx of hiatal hernia, hx craniotomy 02/10/22, L atrial appendage occlusion 07/26/22, TEE without  cardioversion 07/26/2022.     PAIN:  Are you having pain? Yes, 4/10 Left knee resting, worse with standing, walking, terminal extension  PRECAUTIONS: Fall  WEIGHT BEARING RESTRICTIONS: No  FALLS: Has patient fallen in last 6 months? No  PATIENT GOALS: improve coordination, endurance and balance  OBJECTIVE:    INTERVENTION THIS DATE:  NMR:  Standing on airex pad.  Normal BOS: eyes open 30sec, eyes closed 10 sec.  Standing with 1 foot on 6 inch step 2 x 30 sec bil.  Dual task to complete hangman on white board while maintain balance on airex pad. Standing 1 foot on 6 inch step x 45 sec each then normal BOS for remainder to time in standing x 6 minutes. .  Intermittent UE support required on rail at white board with min assist for safety  reciprocal march x 12 BLE, hip abduction x 10 BLE.to improve erect posture and postural control.  Min assist for decreased compensations from trunk and maintain neutral posture  Pt performed stand pivot transfer to and from transport chair with min assist due to ataxia on the R side causing mild LOB, but able to land in chair with uncontrolled descent.   Gait with RW x 49ft +176ft with supervision assist for safety. Mild LOB and noted ataxia on the LUE causing unsteadiness, but no overt LOB.    PATIENT EDUCATION: Education details: exam findings, plan, testing Person educated: Patient and Spouse Education method: Explanation Education comprehension: verbalized understanding  HOME EXERCISE PROGRAM: To be initiated next 1-2 visits   GOALS: Goals reviewed with patient? Yes  SHORT TERM GOALS: Target date: 02/20/2023  Patient will be independent in home exercise program to improve strength/mobility for better functional independence with ADLs. Baseline: to be intiated; 02/13/2023= Patient reports no issues with current HEP.  Goal status: MET   LONG TERM GOALS: Target date: 04/03/2023   Patient will complete five times sit to stand test  in < 10 seconds indicating an increased LE strength and improved balance. Baseline: 14.89 sec; 02/13/2023= 11.99 without UE Support Goal status: Progressing  2.  Patient will increase 10 meter walk test to >1.4m/s as to improve gait speed for better community ambulation and to reduce fall risk. Baseline:  0.82 m/s; 02/13/2023= 1.03 m/s with RW Goal status: MET  3.  Patient will increase Berg Balance score by > 6 points to demonstrate decreased fall risk during functional activities. Baseline: 39/56; 02/13/2023=41/56 Goal status: Progressing  4.  Patient will increase six minute walk test distance to >1000 for progression to community ambulator and improve gait ability Baseline: 795'; 02/13/2023= 890 feet with RW and 2 brief standing rest breaks Goal status: Progressing  5.  Patient will demonstrate improved function as evidenced by a score of 62 on FOTO measure for full participation in activities at home and in the community. Baseline: 59; 02/13/2023=62 Goal status: Progressing  ASSESSMENT:  CLINICAL IMPRESSION: PT treatment focused on balance and stability for BLE from ataxia and hemiplegia as well as management of LLE pain. No increase in pain reported by pt throughout session. Noted to have intermittent bouts of increased ataxia on the R side that limits safety and requires assist from PT to prevent LOB with dynamic balance and hip stability interventions.  Pt will continue to benefit from skilled PT services to address deficits and impairment identified in evaluation in order to maximize independence and safety in basic mobility required for performance of ADL, IADL, and leisure.     OBJECTIVE IMPAIRMENTS: Abnormal gait, decreased activity tolerance, decreased balance, decreased coordination, decreased endurance, decreased knowledge of use of DME, decreased mobility, difficulty walking, decreased strength, impaired UE functional use, improper body mechanics, postural dysfunction, and pain.    ACTIVITY LIMITATIONS: carrying, lifting, bending, standing, squatting, stairs, transfers, and locomotion level  PARTICIPATION LIMITATIONS: meal prep, cleaning, laundry, driving, shopping,  community activity, and yard work  PERSONAL FACTORS: Age, Past/current experiences, Time since onset of injury/illness/exacerbation, and 3+ comorbidities: PMH significant for MDS, chronic a-fib, HTN, DMII, Presence of Watchman Left atrial appendage closure device, anemia, arthritis, hx of hiatal hernia, hx craniotomy 02/10/22, L atrial appendage occlusion 07/26/22, TEE without cardioversion 07/26/2022  are also affecting patient's functional outcome.   REHAB POTENTIAL: Good  CLINICAL DECISION MAKING: Evolving/moderate complexity  EVALUATION COMPLEXITY: Moderate  PLAN:  PT FREQUENCY: 2x/week  PT DURATION: 12 weeks  PLANNED INTERVENTIONS: Therapeutic exercises, Therapeutic activity, Neuromuscular re-education, Balance training, Gait training, Patient/Family education, Self Care, Joint mobilization, Stair training, Vestibular training, Canalith repositioning, Visual/preceptual remediation/compensation, Orthotic/Fit training, DME instructions, Electrical stimulation, Wheelchair mobility training, Spinal mobilization, Cryotherapy, Moist heat, Splintting, Taping, Ultrasound, Manual therapy, and Re-evaluation  PLAN FOR NEXT SESSION:  Continue with strengthening exercises and balance related tasks as tolerated given his left knee DJD limitations  Barrie Folk PT, DPT  Physical Therapist - [facility]  4:13 PM 02/18/23    02/18/23, 4:13 PM

## 2023-02-19 ENCOUNTER — Telehealth: Payer: Self-pay | Admitting: Cardiovascular Disease

## 2023-02-19 NOTE — Telephone Encounter (Signed)
Error.   [place] T. Audie Box, MD, [place]  [address] [place], [place] [address] [phone]  10:56 AM

## 2023-02-19 NOTE — Therapy (Signed)
OUTPATIENT OCCUPATIONAL THERAPY NEURO TREATMENT NOTE   Patient Name: Vernon [name] MRN: [id number] DOB:1946-04-02, 77 y.o., male Today's Date: 02/19/2023  REFERRING PROVIDER:  Harrel Lemon, MD  END OF SESSION:  OT End of Session - 02/19/23 0824     Visit Number 13    Number of Visits 24    Date for OT Re-Evaluation 04/01/23    Authorization Time Period Progress reporting period starting 02/06/23    Progress Note Due on Visit 10    OT Start Time 1600    OT Stop Time 1645    OT Time Calculation (min) 45 min    Equipment Utilized During Treatment transport chair    Activity Tolerance Patient tolerated treatment well    Behavior During Therapy WFL for tasks assessed/performed             Past Medical History:  Diagnosis Date   Anemia    Aortic stenosis    Arthritis    Complication of anesthesia    hard time getting bp up after knee replacement   Coronary artery disease    Diabetes mellitus without complication (HCC)    GERD (gastroesophageal reflux disease)    occ tums prn   History of hiatal hernia    Hypertension    MDS (myelodysplastic syndrome) ([place])    Presence of Watchman left atrial appendage closure device 07/26/2022   71mm Watchman FLX with Dr. Quentin Ore   Past Surgical History:  Procedure Laterality Date   CARPAL TUNNEL RELEASE  2012   CRANIOTOMY N/A 02/10/2022   Procedure: SUBOCCIPITAL CRANIECTOMY FOR EVACUATION OF CEREBELLAR HEMATOMA;  Surgeon: Kristeen Miss, MD;  Location: [facility];  Service: Neurosurgery;  Laterality: N/A;   JOINT REPLACEMENT Right 2010   LEFT ATRIAL APPENDAGE OCCLUSION N/A 07/26/2022   Procedure: LEFT ATRIAL APPENDAGE OCCLUSION;  Surgeon: Vickie Epley, MD;  Location: [facility];  Service: Cardiovascular;  Laterality: N/A;   SHOULDER ARTHROSCOPY WITH ROTATOR CUFF REPAIR AND OPEN BICEPS TENODESIS Right 11/09/2019   Procedure: RIGHT SHOULDER ARTHROSCOPY WITH SUBSCAPULARIS REPAIR, SUBACROMIAL DECOMPRESSION,MINI OPEN ROTATOR CUFF  REPAIR;  Surgeon: Leim Fabry, MD;  Location: [facility];  Service: Orthopedics;  Laterality: Right;   TEE WITHOUT CARDIOVERSION N/A 07/26/2022   Procedure: TRANSESOPHAGEAL ECHOCARDIOGRAM (TEE);  Surgeon: Vickie Epley, MD;  Location: [facility];  Service: Cardiovascular;  Laterality: N/A;   Patient Active Problem List   Diagnosis Date Noted   Urinary tract infection without hematuria 12/19/2022   Painless jaundice 12/18/2022   Elevated LFTs 12/18/2022   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   Obstructive jaundice 12/17/2022   Atrial fibrillation ([place]) 07/26/2022   Presence of Watchman left atrial appendage closure device 07/26/2022   Proteinuria, unspecified 05/23/2022   Simple renal cyst 05/23/2022   Long term current use of anticoagulant 04/04/2022   Rotator cuff syndrome 04/04/2022   Exposure to potentially hazardous substance 04/04/2022   Gout 04/04/2022   Osteoarthritis 04/04/2022   Osteoporosis 04/04/2022   Other specified diseases of hair and hair follicles 0000000   Pain in joint, lower leg 04/04/2022   Problem related to unspecified psychosocial circumstances 04/04/2022   General medical examination for administrative purposes 04/04/2022   Stage 3b chronic kidney disease ([place])    Dysphagia, post-stroke    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 hemorrhage of brain ([place]) 02/26/2022   Cough 123XX123   Acute metabolic encephalopathy 123XX123   Obstructive hydrocephalus ([place]) 02/19/2022   Dyslipidemia 02/19/2022   Dysphagia 02/19/2022   AKI (acute kidney injury) ([place])  Anemia    Pleural effusion on right    Respiratory failure requiring intubation (HCC)    Cerebral edema (HCC)    Chronic atrial fibrillation (HCC)    Controlled type 2 diabetes mellitus with hyperglycemia, without long-term current use of insulin (HCC)    ICH (intracerebral hemorrhage) ([place]) 02/10/2022   Intracranial hemorrhage (HCC)    Anticoagulated    Moderate tricuspid regurgitation 07/26/2021   Moderate  aortic valve stenosis 03/08/2020   MDS (myelodysplastic syndrome) ([place]) 02/18/2020   Macrocytic anemia 01/13/2020   Spleen enlargement 01/13/2020   Bilateral carotid artery stenosis 07/14/2018   Atrial fibrillation, chronic ([place]) 07/14/2018   Type 2 diabetes with nephropathy ([place]) 02/14/2016   Essential hypertension 08/16/2014   Hyperlipemia, mixed 08/16/2014   Renal insufficiency 08/16/2014    ONSET DATE: 02/10/2022  REFERRING DIAG:  CVA  THERAPY DIAG:  Coordination Other lack of coordination  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   Pt accompanied by: significant other  PERTINENT HISTORY: Pt. is a 77 y. 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 Pt. received outpt. OT services following the CVA for Left sided weakness, and incoordination. Pt. was recently hospitalized from 1/22-1/25/24 for Obstructive Jaundice. PMHx includes: MDS, Chronic A-Fib, Essential HTN, Type II DM, Dyslipidemia, Presence of Watchman Left atrial appendage closure device, GERD  PRECAUTIONS: None  WEIGHT BEARING RESTRICTIONS: No  PAIN:  Are you having pain? L knee 2/10 pain achy; rest improves pain, walking worsens pain  FALLS: Has patient fallen in last 6 months? No  LIVING ENVIRONMENT: Lives with: lives with their spouse Lives in: House/apartment one storey, can fully live on the 1st floor. Stairs: Ramped entrance  Has following equipment at home: 4 wheeled walker, w/c-manual, grab bars, shower chair, HH shower head, toilet riser with arms.  PLOF: Independent  PATIENT GOALS: To improve arm and hand function   OBJECTIVE:   HAND DOMINANCE: Right  ADLs: Transfers/ambulation related to ADLs: Eating: Difficulty with motor  control control Grooming:  Independent UB Dressing:  Diffiulty buttoning when no looking directly at the buttons.  LB Dressing: Independent donning pants, socks, and slide on shoes. Toileting: Independent Bathing: Independent, wife assists with towel. Tub Shower transfers: Modified Independence  in the shower, CGA transfers out of shoer.   IADLs: Shopping:  Pt.'s wife performs, occasionally uses grocery store scooters Light housekeeping: Wife performs housekeeping tasks. Meal Prep: Prepares light snacks, and light meal prep Community mobility: Relies on family and friends Medication management: Independent Financial management: Does  with wife; No changes in how they are doing them Handwriting: 100% legible for name: Wife has noticed some changes with writing  Hobbies: Plays the guitar, workshop, music shop.  MOBILITY STATUS: Needs Assist: uses a rollator  POSTURE COMMENTS:  No Significant postural limitations Sitting balance: WFL  ACTIVITY TOLERANCE: Activity tolerance:  Limited  FUNCTIONAL OUTCOME MEASURES:  FOTO: 59; TR score: 60 02/06/23: 61  UPPER EXTREMITY ROM:    Active ROM Right Eval  Left Eval [facility]  Shoulder flexion 106   Shoulder abduction 85   Shoulder adduction    Shoulder extension    Shoulder internal rotation    Shoulder external rotation    Elbow flexion WFL   Elbow extension WFL   Wrist flexion    Wrist extension [facility]   Wrist  ulnar deviation    Wrist radial deviation    Wrist pronation    Wrist supination WFL   (Blank rows = not tested)  Digit flexion to the [facility]:  2nd: 0cm, 3rd: 1.5cm, 4th: 2.5cm, 5th: 0cm   UPPER EXTREMITY MMT:     MMT Right eval Left eval  Shoulder flexion 3- 4  Shoulder abduction 3- 4  Shoulder adduction    Shoulder extension    Shoulder internal rotation    Shoulder external rotation    Middle trapezius    Lower trapezius    Elbow flexion 4 4  Elbow extension 4 4  Wrist flexion    Wrist extension 4 4  Wrist  ulnar deviation    Wrist radial deviation    Wrist pronation    Wrist supination    (Blank rows = not tested)  HAND FUNCTION: Grip strength: N/A secondary to arthritis, and 3rd digit trigger finger    Lateral Pinch: R: 23#, L: 20#  3 Pt. Pinch: R: 22#, L: 21# 02/06/23: Lateral Pinch: R: 22#, L: 20#  3 Pt. Pinch: R: 22#, L: 17#  COORDINATION: 9 Hole Peg test: Right: 25 sec; Left: 2 min. & 34  sec 02/06/23: 9 Hole Peg test: Left: 1 min. & 40 sec  SENSATION: WFL Light touch: WFL Stereognosis: WFL  EDEMA:   N/A  MUSCLE TONE:  Intact  COGNITION: Overall cognitive status: Within functional limits for tasks assessed  VISION: Subjective report: Pt. reports double vision initially at the onset of the CVA with difficulty scanning to left when reading. Pt. Reports this has improved, and the double vision has resolved.   PERCEPTION: WFL  PRAXIS: Impaired motor control, and coordination   TODAY'S TREATMENT:                                                                        Neuromuscular re-education: Facilitated L GMC/FMC as pt worked on goal directed reaching for targets sliding glass stones around upright jumbo pegs on table top without knocking over pegs, then placing and removing stones from tops of pegs without knocking pegs over.  Pt stabilized peg with R hand while the L hand placed stone on top of peg without resting L forearm on table top.  Able to complete without R hand as a stabilizer if L forearm was supported on table top. Further practiced L FMC/dexterity and functional reaching skills working to place ball pegs into pegboard.  Pt practiced placing pegs with pegboard flat on table top, then transitioned to removing pegs from board with pegboard on an incline to further challenge [place] with forward and lateral reaching patterns.  Frequent rest breaks needed d/t LUE fatigue.  PATIENT EDUCATION: Education details:  LUE reaching/coordination training, 2 handed sipping from  cup Person educated: vc, Electronics engineer: explanation, demo, vc Education comprehension: verbalized/demonstrated understanding, further training needed  Home Exercise Program:  Engaging the LUE into daily tasks, use of LUE for reaching/grasping ADL supplies  GOALS: Goals reviewed with patient?  SHORT TERM GOALS: Target date: 02/18/2023   Pt. will demonstrate independence with HEP for LUE functioning Baseline: Eval: No current HEP; 02/06/23: Pt consistently engaging the LUE into grasping and reaching for ADL supplies that can not  be damaged if dropped Goal status: ongoing  LONG TERM GOALS: Target date:  04/01/2023  Pt. will improve FOTO score by 1 point for patient perceived improvement with assessment specific ADLs, and IADLs.  Baseline: Eval: FOTO score: 59; TR score: 60; 02/06/23: 61 Goal status: achieved/ongoing  2.  Pt. will independently modulate the accuracy of holding items without overgripping when reaching for items 50% of the time Baseline: Eval: Pt. is consistently overpowering, and over gripping items when grasping them with the left hand; 02/06/23: pt reports that he can tell he's improved with ability to press his bass strings without too much force.  Continued difficulty using correct force to grasp a cup. Goal status: ongoing  3.  Pt. will improve left hand [facility] skills (Revised: 9 hole in < 1 min 15 sec) (initial: by 10 sec; achieved) to improve left hand grasp on objects.  Baseline: Eval: R: 25 sec. L: 2 min. & 34 sec; 02/06/23: L 1 min 40 sec Goal status: revised  4.  Pt. Will utilize compensatory strategies for left hand function motor coordination 75% during ADLs, and IADL tasks. Baseline: Eval: Minimal use of compensatory strategies during ADLs, and IADLs; 02/06/23: Pt more consistently bearing weight into L forearm when performing [place] tasks to increase distal control. Goal status: ongoing  5.  Pt. Will use compensatory strategies for left hand function 75% of the  time when utilizing multiple body systems, dual tasking, and using the hand in a variety of different contexts. Baseline: Eval: Pt. Consistently has difficulty using his left hand to complete tasks when he is not looking directly at his hand, and has more difficulty using his left hand when standing, or attempting to walk with the walker; 02/06/23: Pt more consistently bearing weight into L forearm when performing [facility] tasks to increase distal control, occasionally will stabilize L wrist with R hand, and initiates rest breaks when LUE is fatigued.  Goal status: ongoing ASSESSMENT:   CLINICAL IMPRESSION: Pt continues to focus on LUE gross and [facility] skills as he continues to struggle significantly when arm is not supported.  When working with upright jumbo pegs on table top and glass stones, pt stabilized peg with R hand while the L hand placed stone on top of peg without resting L forearm on table top.  Able to complete without R hand as a stabilizer if L forearm was supported on table top. Pt continues to require frequent rest breaks with LUE [facility] and [place] activities d/t LUE fatigue, which further challenges motor control.  Pt continues to benefit from OT services to improve Left hand function, improve LUE ataxia, use of graded pressure with grasping, and [facility] skills in order to be able to use his hand when challenged in multiple contexts, and with dual tasking. Pt will benefit from education about compensatory strategies for left hand function during ADLs, and IADLs.    PERFORMANCE DEFICITS: in functional skills including ADLs, IADLs, coordination, dexterity, ROM, strength, Fine motor control, and Gross motor control, cognitive skills including attention, memory, problem solving, and safety awareness, and psychosocial skills including coping strategies, environmental adaptation, habits, and routines and behaviors.   IMPAIRMENTS: are limiting patient from ADLs, IADLs, play, leisure, and social participation.    CO-MORBIDITIES: may have co-morbidities  that affects occupational performance. Patient will benefit from skilled OT to address above impairments and improve overall function.  MODIFICATION OR ASSISTANCE TO COMPLETE EVALUATION: Min-Moderate modification of tasks or assist with assess necessary to complete an evaluation.  OT  OCCUPATIONAL PROFILE AND HISTORY: Detailed assessment: Review of records and additional review of physical, cognitive, psychosocial history related to current functional performance.  CLINICAL DECISION MAKING: Moderate - several treatment options, min-mod task modification necessary  REHAB POTENTIAL: Good  EVALUATION COMPLEXITY: Moderate    PLAN:  OT FREQUENCY: 2x/week  OT DURATION: 12 weeks  PLANNED INTERVENTIONS: self care/ADL training, therapeutic exercise, therapeutic activity, patient/family education, cognitive remediation/compensation, and DME and/or AE instructions, neuromuscular re-education  RECOMMENDED OTHER SERVICES: OT services.  CONSULTED AND AGREED WITH PLAN OF CARE: Patient and family member/caregiver-wife  PLAN FOR NEXT SESSION: Initiate treatment  Leta Speller, MS, OTR/L   Darleene Cleaver, OT 02/19/2023, 8:26 AM

## 2023-02-20 ENCOUNTER — Inpatient Hospital Stay: Payer: Medicare Other

## 2023-02-20 ENCOUNTER — Ambulatory Visit: Payer: Medicare Other

## 2023-02-20 VITALS — BP 143/71

## 2023-02-20 DIAGNOSIS — D469 Myelodysplastic syndrome, unspecified: Secondary | ICD-10-CM

## 2023-02-20 DIAGNOSIS — M6281 Muscle weakness (generalized): Secondary | ICD-10-CM

## 2023-02-20 DIAGNOSIS — R262 Difficulty in walking, not elsewhere classified: Secondary | ICD-10-CM

## 2023-02-20 DIAGNOSIS — R2681 Unsteadiness on feet: Secondary | ICD-10-CM

## 2023-02-20 DIAGNOSIS — R278 Other lack of coordination: Secondary | ICD-10-CM

## 2023-02-20 DIAGNOSIS — I69254 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: Secondary | ICD-10-CM

## 2023-02-20 DIAGNOSIS — R2689 Other abnormalities of gait and mobility: Secondary | ICD-10-CM

## 2023-02-20 DIAGNOSIS — D461 Refractory anemia with ring sideroblasts: Secondary | ICD-10-CM | POA: Diagnosis not present

## 2023-02-20 LAB — HEMOGLOBIN AND HEMATOCRIT, BLOOD
HCT: 29.7 % — ABNORMAL LOW (ref 39.0–52.0)
Hemoglobin: 9.4 g/dL — ABNORMAL LOW (ref 13.0–17.0)

## 2023-02-20 MED ORDER — DARBEPOETIN ALFA 100 MCG/0.5ML IJ SOSY
100.0000 ug | PREFILLED_SYRINGE | Freq: Once | INTRAMUSCULAR | Status: AC
  Administered 2023-02-20: 100 ug via SUBCUTANEOUS
  Filled 2023-02-20: qty 0.5

## 2023-02-20 NOTE — Therapy (Addendum)
OUTPATIENT PHYSICAL THERAPY TREATMENT  Patient Name: Vernon [name] MRN: [id number] DOB:1946/10/21, 77 y.o., male Today's Date: 02/21/2023   PCP:  Baxter Hire, MD   REFERRING PROVIDER:  Baxter Hire, MD    END OF SESSION:  PT End of Session - 02/20/23 1436     Visit Number 12    Number of Visits 25    Date for PT Re-Evaluation 04/03/23    Authorization Type [employer] Medicare    Progress Note Due on Visit 70    PT Start Time 1432    PT Stop Time 1514    PT Time Calculation (min) 42 min    Equipment Utilized During Treatment Gait belt    Activity Tolerance Patient tolerated treatment well    Behavior During Therapy WFL for tasks assessed/performed               Past Medical History:  Diagnosis Date   Anemia    Aortic stenosis    Arthritis    Complication of anesthesia    hard time getting bp up after knee replacement   Coronary artery disease    Diabetes mellitus without complication (HCC)    GERD (gastroesophageal reflux disease)    occ tums prn   History of hiatal hernia    Hypertension    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 Quentin Ore   Past Surgical History:  Procedure Laterality Date   CARPAL TUNNEL RELEASE  2012   CRANIOTOMY N/A 02/10/2022   Procedure: SUBOCCIPITAL CRANIECTOMY FOR EVACUATION OF CEREBELLAR HEMATOMA;  Surgeon: Kristeen Miss, MD;  Location: [facility];  Service: Neurosurgery;  Laterality: N/A;   JOINT REPLACEMENT Right 2010   LEFT ATRIAL APPENDAGE OCCLUSION N/A 07/26/2022   Procedure: LEFT ATRIAL APPENDAGE OCCLUSION;  Surgeon: Vickie Epley, MD;  Location: [facility];  Service: Cardiovascular;  Laterality: N/A;   SHOULDER ARTHROSCOPY WITH ROTATOR CUFF REPAIR AND OPEN BICEPS TENODESIS Right 11/09/2019   Procedure: RIGHT SHOULDER ARTHROSCOPY WITH SUBSCAPULARIS REPAIR, SUBACROMIAL DECOMPRESSION,MINI OPEN ROTATOR CUFF REPAIR;   Surgeon: Leim Fabry, MD;  Location: [facility];  Service: Orthopedics;  Laterality: Right;   TEE WITHOUT CARDIOVERSION N/A 07/26/2022   Procedure: TRANSESOPHAGEAL ECHOCARDIOGRAM (TEE);  Surgeon: Vickie Epley, MD;  Location: [facility];  Service: Cardiovascular;  Laterality: N/A;   Patient Active Problem List   Diagnosis Date Noted   Urinary tract infection without hematuria 12/19/2022   Painless jaundice 12/18/2022   Elevated LFTs 12/18/2022   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   Obstructive jaundice 12/17/2022   Atrial fibrillation ([place]) 07/26/2022   Presence of Watchman left atrial appendage closure device 07/26/2022   Proteinuria, unspecified 05/23/2022   Simple renal cyst 05/23/2022   Long term current use of anticoagulant 04/04/2022   Rotator cuff syndrome 04/04/2022   Exposure to potentially hazardous substance 04/04/2022   Gout 04/04/2022   Osteoarthritis 04/04/2022   Osteoporosis 04/04/2022   Other specified diseases of hair and hair follicles 0000000   Pain in joint, lower leg 04/04/2022   Problem related to unspecified psychosocial circumstances 04/04/2022   General medical examination for administrative purposes 04/04/2022   Stage 3b chronic kidney disease ([place])    Dysphagia, post-stroke    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 hemorrhage of brain ([place]) 02/26/2022   Cough 123XX123   Acute metabolic encephalopathy 123XX123   Obstructive hydrocephalus ([place]) 02/19/2022   Dyslipidemia 02/19/2022   Dysphagia 02/19/2022   AKI (  acute kidney injury) ([place])    Anemia    Pleural effusion on right    Respiratory failure requiring intubation (HCC)    Cerebral edema (HCC)    Chronic atrial fibrillation (HCC)    Controlled type 2 diabetes mellitus with hyperglycemia, without long-term current use of insulin (HCC)    ICH (intracerebral hemorrhage) ([place]) 02/10/2022   Intracranial hemorrhage (HCC)    Anticoagulated    Moderate tricuspid regurgitation 07/26/2021   Moderate aortic  valve stenosis 03/08/2020   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[place]) 02/18/2020   Macrocytic anemia 01/13/2020   Spleen enlargement 01/13/2020   Bilateral carotid artery stenosis 07/14/2018   Atrial fibrillation, chronic ([place]) 07/14/2018   Type 2 diabetes with nephropathy ([place]) 02/14/2016   Essential hypertension 08/16/2014   Hyperlipemia, mixed 08/16/2014   Renal insufficiency 08/16/2014    ONSET DATE: 02/10/2022  REFERRING DIAG:  I63.9 (ICD-10-CM) - Cerebral infarction, unspecified  I63.9 (ICD-10-CM) - CVA (cerebral vascular accident) ([place])    THERAPY DIAG:  Difficulty in walking, not elsewhere classified  Muscle weakness (generalized)  Other abnormalities of gait and mobility  Unsteadiness on feet  Other lack of coordination  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 Reports 2/10 left knee pain.    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 Pt hospitalized 1/22-1/25/24 for obstructive jaundice. Pt reports it was found he had a UTI with recent hospital stay. He wants to improve his endurance and balance. PMH significant for MDS, chronic a-fib, HTN, DMII, Presence of Watchman Left atrial appendage closure device, anemia, arthritis, hx of hiatal hernia, hx craniotomy 02/10/22, L atrial appendage occlusion 07/26/22, TEE without cardioversion 07/26/2022.     PAIN:  Are you having pain? Yes, 2/10 Left knee resting, worse with standing, walking,  terminal extension  PRECAUTIONS: Fall  WEIGHT BEARING RESTRICTIONS: No  FALLS: Has patient fallen in last 6 months? No  PATIENT GOALS: improve coordination, endurance and balance  OBJECTIVE:    INTERVENTION THIS DATE:     NMR:   Standing at steps- Step tap with BUE support x 20 reps Static standing on airex pad x 30 sec x  3 each LE Step up with alt LE x 10 reps with min UE support.  Static standing on incline - 30 sec hold x 2 sec Dynamic a/p weight shift on incline x 20 reps Static stand 1 foot on step and 1 on floor- Hold 30 sec x 2 each LE Standing with 1 foot on 6 inch step 2 x 30 sec bil.   GAIT Gait with RW x 150 with supervision assist for safety. Mild LOB and noted ataxia on the LUE causing unsteadiness, but no overt LOB.  Switched to upright 4WW x 300 feet and patient much more steady with improved posture and less ataxia with LUE due to more weightbearing through Left UE.   PATIENT EDUCATION: Education details: exam findings, plan, testing Person educated: Patient and Spouse Education method: Explanation Education comprehension: verbalized understanding  HOME EXERCISE PROGRAM: To be initiated next 1-2 visits   GOALS: Goals reviewed with patient? Yes  SHORT TERM GOALS: Target date: 02/20/2023  Patient will be independent in home exercise program to improve strength/mobility for better functional independence with ADLs. Baseline: to be intiated; 02/13/2023= Patient reports no issues with current HEP.  Goal status: MET   LONG TERM GOALS: Target date: 04/03/2023   Patient will complete five times sit to stand test in < 10 seconds indicating an increased LE strength and improved balance. Baseline: 14.89 sec; 02/13/2023= 11.99 without UE Support Goal status: Progressing  2.  Patient will increase 10 meter walk test to >1.76m/s as to improve gait speed for better community ambulation and to reduce fall risk. Baseline:  0.82 m/s; 02/13/2023= 1.03 m/s with RW Goal  status: MET  3.  Patient will increase Berg Balance score by > 6 points to demonstrate decreased fall risk during functional activities. Baseline: 39/56; 02/13/2023=41/56 Goal status: Progressing  4.  Patient will increase six minute walk test distance to >1000 for progression to community ambulator and improve gait ability Baseline: 795'; 02/13/2023= 890 feet with RW and 2 brief standing rest breaks Goal status: Progressing  5.  Patient will demonstrate improved function as evidenced by a score of 62 on FOTO measure for full participation in activities at home and in the community. Baseline: 59; 02/13/2023=62 Goal status: Progressing  ASSESSMENT:  CLINICAL IMPRESSION: Patient presents with good motivation for treatment. He continues to be challenged with all balance activities using mostly ankle/hip righting strategies- improved with a/p weight shifiting and reaction without significant LOB. He also responded well to trial use of upright walker- much steadier and efficient- will continue to benefit from further practice in sessions.  Pt will continue to benefit from skilled PT services to address deficits and impairment identified in evaluation in order to maximize independence and safety in basic mobility required for performance of ADL, IADL, and leisure.     OBJECTIVE IMPAIRMENTS: Abnormal gait, decreased activity tolerance, decreased balance, decreased coordination, decreased endurance, decreased knowledge of use of DME, decreased mobility, difficulty walking, decreased strength, impaired UE functional use, improper body mechanics, postural dysfunction, and pain.   ACTIVITY LIMITATIONS: carrying, lifting, bending, standing, squatting, stairs, transfers, and locomotion level  PARTICIPATION LIMITATIONS: meal prep, cleaning, laundry, driving, shopping, community activity, and yard work  PERSONAL FACTORS: Age, Past/current experiences, Time since onset of injury/illness/exacerbation, and 3+  comorbidities: PMH significant for MDS, chronic a-fib, HTN, DMII, Presence of Watchman Left atrial appendage closure device, anemia, arthritis, hx of hiatal hernia, hx craniotomy 02/10/22, L atrial appendage occlusion 07/26/22, TEE without cardioversion 07/26/2022  are also affecting patient's  functional outcome.   REHAB POTENTIAL: Good  CLINICAL DECISION MAKING: Evolving/moderate complexity  EVALUATION COMPLEXITY: Moderate  PLAN:  PT FREQUENCY: 2x/week  PT DURATION: 12 weeks  PLANNED INTERVENTIONS: Therapeutic exercises, Therapeutic activity, Neuromuscular re-education, Balance training, Gait training, Patient/Family education, Self Care, Joint mobilization, Stair training, Vestibular training, Canalith repositioning, Visual/preceptual remediation/compensation, Orthotic/Fit training, DME instructions, Electrical stimulation, Wheelchair mobility training, Spinal mobilization, Cryotherapy, Moist heat, Splintting, Taping, Ultrasound, Manual therapy, and Re-evaluation  PLAN FOR NEXT SESSION:  Continue with strengthening exercises and balance related tasks as tolerated given his left knee DJD limitations  Ollen Bowl, PT Physical Therapist - [facility]  8:20 AM 02/21/23    02/21/23, 8:20 AM

## 2023-02-21 ENCOUNTER — Encounter (HOSPITAL_COMMUNITY): Payer: Self-pay

## 2023-02-21 ENCOUNTER — Telehealth (HOSPITAL_COMMUNITY): Payer: Self-pay | Admitting: *Deleted

## 2023-02-21 NOTE — Telephone Encounter (Signed)
Patient's wife calling upcoming cardiac imaging study; pt verbalizes understanding of appt date/time, parking situation and where to check in, pre-test NPO status  and verified current allergies; name and call back number provided for further questions should they arise  Vernon Clement RN Navigator [facility] [phone] office [phone] cell.  Patient's wife is aware that patient is to avoid caffeine 12 hours prior to his cardiac PET scan.

## 2023-02-25 ENCOUNTER — Ambulatory Visit: Payer: Medicare Other

## 2023-02-25 ENCOUNTER — Ambulatory Visit: Payer: Medicare Other | Attending: Internal Medicine

## 2023-02-25 DIAGNOSIS — I69254 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: Secondary | ICD-10-CM | POA: Diagnosis present

## 2023-02-25 DIAGNOSIS — R262 Difficulty in walking, not elsewhere classified: Secondary | ICD-10-CM | POA: Insufficient documentation

## 2023-02-25 DIAGNOSIS — M6281 Muscle weakness (generalized): Secondary | ICD-10-CM | POA: Diagnosis present

## 2023-02-25 DIAGNOSIS — R2681 Unsteadiness on feet: Secondary | ICD-10-CM | POA: Diagnosis present

## 2023-02-25 DIAGNOSIS — R278 Other lack of coordination: Secondary | ICD-10-CM

## 2023-02-25 DIAGNOSIS — R2689 Other abnormalities of gait and mobility: Secondary | ICD-10-CM | POA: Insufficient documentation

## 2023-02-25 NOTE — Therapy (Signed)
OUTPATIENT PHYSICAL THERAPY TREATMENT  Patient Name: Vernon [name] MRN: [id number] DOB:1946-11-04, 77 y.o., male Today's Date: 02/25/2023   PCP:  Baxter Hire, MD   REFERRING PROVIDER:  Baxter Hire, MD    END OF SESSION:  PT End of Session - 02/25/23 1017     Visit Number 13    Number of Visits 25    Date for PT Re-Evaluation 04/03/23    Authorization Type U[employer] Medicare    Progress Note Due on Visit 17    PT Start Time 1017    Equipment Utilized During Treatment Gait belt    Activity Tolerance Patient tolerated treatment well    Behavior During Therapy WFL for tasks assessed/performed               Past Medical History:  Diagnosis Date   Anemia    Aortic stenosis    Arthritis    Complication of anesthesia    hard time getting bp up after knee replacement   Coronary artery disease    Diabetes mellitus without complication    GERD (gastroesophageal reflux disease)    occ tums prn   History of hiatal hernia    Hypertension    MDS (myelodysplastic syndrome)    Presence of Watchman left atrial appendage closure device 07/26/2022   51mm Watchman FLX with Dr. Quentin Ore   Past Surgical History:  Procedure Laterality Date   CARPAL TUNNEL RELEASE  2012   CRANIOTOMY N/A 02/10/2022   Procedure: SUBOCCIPITAL CRANIECTOMY FOR EVACUATION OF CEREBELLAR HEMATOMA;  Surgeon: Kristeen Miss, MD;  Location: [facility];  Service: Neurosurgery;  Laterality: N/A;   JOINT REPLACEMENT Right 2010   LEFT ATRIAL APPENDAGE OCCLUSION N/A 07/26/2022   Procedure: LEFT ATRIAL APPENDAGE OCCLUSION;  Surgeon: Vickie Epley, MD;  Location: [facility];  Service: Cardiovascular;  Laterality: N/A;   SHOULDER ARTHROSCOPY WITH ROTATOR CUFF REPAIR AND OPEN BICEPS TENODESIS Right 11/09/2019   Procedure: RIGHT SHOULDER ARTHROSCOPY WITH SUBSCAPULARIS REPAIR, SUBACROMIAL DECOMPRESSION,MINI OPEN ROTATOR CUFF REPAIR;  Surgeon: Leim Fabry, MD;  Location: [facility];  Service: Orthopedics;   Laterality: Right;   TEE WITHOUT CARDIOVERSION N/A 07/26/2022   Procedure: TRANSESOPHAGEAL ECHOCARDIOGRAM (TEE);  Surgeon: Vickie Epley, MD;  Location: [facility];  Service: Cardiovascular;  Laterality: N/A;   Patient Active Problem List   Diagnosis Date Noted   Urinary tract infection without hematuria 12/19/2022   Painless jaundice 12/18/2022   Elevated LFTs 12/18/2022   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   Obstructive jaundice 12/17/2022   Atrial fibrillation 07/26/2022   Presence of Watchman left atrial appendage closure device 07/26/2022   Proteinuria, unspecified 05/23/2022   Simple renal cyst 05/23/2022   Long term current use of anticoagulant 04/04/2022   Rotator cuff syndrome 04/04/2022   Exposure to potentially hazardous substance 04/04/2022   Gout 04/04/2022   Osteoarthritis 04/04/2022   Osteoporosis 04/04/2022   Other specified diseases of hair and hair follicles 0000000   Pain in joint, lower leg 04/04/2022   Problem related to unspecified psychosocial circumstances 04/04/2022   General medical examination for administrative purposes 04/04/2022   Stage 3b chronic kidney disease    Dysphagia, post-stroke    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 hemorrhage of brain 02/26/2022   Cough 123XX123   Acute metabolic encephalopathy 123XX123   Obstructive hydrocephalus 02/19/2022   Dyslipidemia 02/19/2022   Dysphagia 02/19/2022   AKI (acute kidney injury)    Anemia    Pleural effusion on right    Respiratory failure requiring intubation  Cerebral edema    Chronic atrial fibrillation    Controlled type 2 diabetes mellitus with hyperglycemia, without long-term current use of insulin    ICH (intracerebral hemorrhage) 02/10/2022   Intracranial hemorrhage    Anticoagulated    Moderate tricuspid regurgitation 07/26/2021   Moderate aortic valve stenosis 03/08/2020   MDS (myelodysplastic syndrome) 02/18/2020   Macrocytic anemia 01/13/2020   Spleen enlargement 01/13/2020    Bilateral carotid artery stenosis 07/14/2018   Atrial fibrillation, chronic 07/14/2018   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   Essential hypertension 08/16/2014   Hyperlipemia, mixed 08/16/2014   Renal insufficiency 08/16/2014    ONSET DATE: 02/10/2022  REFERRING DIAG:  I63.9 (ICD-10-CM) - Cerebral infarction, unspecified  I63.9 (ICD-10-CM) - CVA (cerebral vascular accident) ([place])    THERAPY DIAG:  Difficulty in walking, not elsewhere classified  Muscle weakness (generalized)  Other abnormalities of gait and mobility  Unsteadiness on feet  Other lack of coordination  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 Pt hospitalized 1/22-1/25/24 for obstructive jaundice. Pt reports it was found he had a UTI with recent hospital stay. He wants to improve his endurance and balance. PMH significant for MDS, chronic a-fib, HTN, DMII, Presence of Watchman Left atrial appendage closure device, anemia, arthritis, hx of hiatal hernia, hx craniotomy 02/10/22, L atrial appendage occlusion 07/26/22, TEE without cardioversion 07/26/2022.     PAIN:  Are you having pain? Yes, 2/10 Left knee resting, worse with standing, walking, terminal extension  PRECAUTIONS: Fall  WEIGHT BEARING RESTRICTIONS: No  FALLS: Has patient fallen in last 6 months? No  PATIENT GOALS: improve coordination, endurance and balance  OBJECTIVE:    INTERVENTION  THIS DATE:     NMR:   Step tap with BUE support initially for 1st 5 reps then  x 20 reps without UE support. Slow step tap with RLE  Static standing (staggered 1 foot on floor and front foot up on 6" step) with dual task- Playing word game at dry erase board without UE Support Static standing (each LE on airex pad- initially in wide stance- 1/2 way  through game - changed position to narrowed stance) - Playing word game at dry erase board Side stepping over 1/2 foam without UE support and using 3#AW Left LE x 15 reps   Therex:   Seated LAQ with 3#AW with 3 sec hold x 10 reps Seated hip flex/abd up/over 1/2 foam with 3# AW x 12 reps each LE Seated ham curl with RTB x 15 reps each LE   PATIENT EDUCATION: Education details: exam findings, plan, testing Person educated: Patient and Spouse Education method: Explanation Education comprehension: verbalized understanding  HOME EXERCISE PROGRAM: To be initiated next 1-2 visits   GOALS: Goals reviewed with patient? Yes  SHORT TERM GOALS: Target date: 02/20/2023  Patient will be independent in home exercise program to improve strength/mobility for better functional independence with ADLs. Baseline: to be intiated; 02/13/2023= Patient reports no issues with current HEP.  Goal status: MET   LONG TERM GOALS: Target date: 04/03/2023   Patient will complete five times sit to stand test in < 10 seconds indicating an increased LE strength and improved balance. Baseline: 14.89 sec; 02/13/2023= 11.99 without UE Support Goal status: Progressing  2.  Patient will increase 10 meter walk test to >1.36m/s as to improve gait speed for better community ambulation and to reduce fall risk. Baseline:  0.82 m/s; 02/13/2023= 1.03 m/s with RW Goal status: MET  3.  Patient will increase Berg Balance score by > 6 points to demonstrate decreased fall risk during functional activities. Baseline: 39/56; 02/13/2023=41/56 Goal status: Progressing  4.  Patient  will increase six minute walk test distance to >1000 for progression to community ambulator and improve gait ability Baseline: 795'; 02/13/2023= 890 feet with RW and 2 brief standing rest breaks Goal status: Progressing  5.  Patient will demonstrate improved function as evidenced by a score of 62 on FOTO measure for full participation in activities at home and in the community. Baseline: 59; 02/13/2023=62 Goal status: Progressing  ASSESSMENT:  CLINICAL IMPRESSION: Patient continues to be challenged with all balance activities- using ankle and hip strategies. He was challenged with LE strengthening with fatigue as limiting factor. No significant LE but unsteady more with feet narrowed today. Pt will continue to benefit from skilled PT services to address deficits and impairment identified in evaluation in order to maximize independence and safety in basic mobility required for performance of ADL, IADL, and leisure.     OBJECTIVE IMPAIRMENTS: Abnormal gait, decreased activity tolerance, decreased balance, decreased coordination, decreased endurance, decreased knowledge of use of DME, decreased mobility, difficulty walking, decreased strength, impaired UE functional use, improper body mechanics, postural dysfunction, and pain.   ACTIVITY LIMITATIONS: carrying, lifting, bending, standing, squatting, stairs, transfers, and locomotion level  PARTICIPATION LIMITATIONS: meal prep, cleaning, laundry, driving, shopping, community activity, and yard work  PERSONAL FACTORS: Age, Past/current experiences, Time since onset of injury/illness/exacerbation, and 3+ comorbidities: PMH significant for MDS, chronic a-fib, HTN, DMII, Presence of Watchman Left atrial appendage closure device, anemia, arthritis, hx of hiatal hernia, hx craniotomy 02/10/22, L atrial appendage occlusion 07/26/22, TEE without cardioversion 07/26/2022  are also affecting patient's functional outcome.   REHAB POTENTIAL: Good  CLINICAL  DECISION MAKING: Evolving/moderate complexity  EVALUATION COMPLEXITY: Moderate  PLAN:  PT FREQUENCY: 2x/week  PT DURATION: 12 weeks  PLANNED INTERVENTIONS: Therapeutic exercises, Therapeutic activity, Neuromuscular re-education, Balance training, Gait training, Patient/Family education, Self Care, Joint mobilization, Stair training, Vestibular training, Canalith repositioning, Visual/preceptual remediation/compensation, Orthotic/Fit training, DME instructions, Electrical stimulation, Wheelchair mobility training, Spinal mobilization, Cryotherapy, Moist heat,  Splintting, Taping, Ultrasound, Manual therapy, and Re-evaluation  PLAN FOR NEXT SESSION:  Continue with strengthening exercises and balance related tasks as tolerated given his left knee DJD limitations  Ollen Bowl, PT Physical Therapist - [facility]  1:20 PM 02/25/23    02/25/23, 1:20 PM

## 2023-02-26 ENCOUNTER — Encounter (HOSPITAL_COMMUNITY)
Admission: RE | Admit: 2023-02-26 | Discharge: 2023-02-26 | Disposition: A | Discharge status: Home / Self Care | Payer: Medicare Other | Source: Ambulatory Visit | Attending: Cardiovascular Disease | Admitting: Cardiovascular Disease

## 2023-02-26 DIAGNOSIS — R0602 Shortness of breath: Secondary | ICD-10-CM | POA: Diagnosis not present

## 2023-02-26 DIAGNOSIS — I2510 Atherosclerotic heart disease of native coronary artery without angina pectoris: Secondary | ICD-10-CM | POA: Diagnosis present

## 2023-02-26 LAB — NM PET CT CARDIAC PERFUSION MULTI W/ABSOLUTE BLOODFLOW
MBFR: 1.41
Nuc Rest EF: 45 %
Nuc Stress EF: 48 %
Rest MBF: 1.01 ml/g/min
Rest Nuclear Isotope Dose: 23.5 mCi
ST Depression (mm): 0 mm
Stress MBF: 1.42 ml/g/min
Stress Nuclear Isotope Dose: 23.5 mCi

## 2023-02-26 MED ORDER — REGADENOSON 0.4 MG/5ML IV SOLN
INTRAVENOUS | Status: AC
  Filled 2023-02-26: qty 5

## 2023-02-26 MED ORDER — RUBIDIUM RB82 GENERATOR (RUBYFILL)
23.5000 | PACK | Freq: Once | INTRAVENOUS | Status: AC
  Administered 2023-02-26: 23.5 via INTRAVENOUS

## 2023-02-26 MED ORDER — REGADENOSON 0.4 MG/5ML IV SOLN: 0.4000 mg | Freq: Once | INTRAVENOUS | Status: DC

## 2023-02-26 NOTE — Therapy (Signed)
OUTPATIENT OCCUPATIONAL THERAPY NEURO TREATMENT NOTE   Patient Name: Vernon [name] MRN: [id number] DOB:13-Nov-1946, 77 y.o., male Today's Date: 02/26/2023  REFERRING PROVIDER:  Harrel Lemon, MD  END OF SESSION:  OT End of Session - 02/25/23 1102     Visit Number 14    Number of Visits 24    Date for OT Re-Evaluation 04/01/23    Authorization Time Period Progress reporting period starting 02/06/23    Progress Note Due on Visit 10             Past Medical History:  Diagnosis Date   Anemia    Aortic stenosis    Arthritis    Complication of anesthesia    hard time getting bp up after knee replacement   Coronary artery disease    Diabetes mellitus without complication    GERD (gastroesophageal reflux disease)    occ tums prn   History of hiatal hernia    Hypertension    MDS (myelodysplastic syndrome)    Presence of Watchman left atrial appendage closure device 07/26/2022   56mm Watchman FLX with Dr. Quentin Ore   Past Surgical History:  Procedure Laterality Date   CARPAL TUNNEL RELEASE  2012   CRANIOTOMY N/A 02/10/2022   Procedure: SUBOCCIPITAL CRANIECTOMY FOR EVACUATION OF CEREBELLAR HEMATOMA;  Surgeon: Kristeen Miss, MD;  Location: [facility];  Service: Neurosurgery;  Laterality: N/A;   JOINT REPLACEMENT Right 2010   LEFT ATRIAL APPENDAGE OCCLUSION N/A 07/26/2022   Procedure: LEFT ATRIAL APPENDAGE OCCLUSION;  Surgeon: Vickie Epley, MD;  Location: [place] [facility];  Service: Cardiovascular;  Laterality: N/A;   SHOULDER ARTHROSCOPY WITH ROTATOR CUFF REPAIR AND OPEN BICEPS TENODESIS Right 11/09/2019   Procedure: RIGHT SHOULDER ARTHROSCOPY WITH SUBSCAPULARIS REPAIR, SUBACROMIAL DECOMPRESSION,MINI OPEN ROTATOR CUFF REPAIR;  Surgeon: Leim Fabry, MD;  Location: [facility];  Service: Orthopedics;  Laterality: Right;   TEE WITHOUT CARDIOVERSION N/A 07/26/2022   Procedure: TRANSESOPHAGEAL ECHOCARDIOGRAM (TEE);  Surgeon: Vickie Epley, MD;  Location: [place] [facility];  Service:  Cardiovascular;  Laterality: N/A;   Patient Active Problem List   Diagnosis Date Noted   Urinary tract infection without hematuria 12/19/2022   Painless jaundice 12/18/2022   Elevated LFTs 12/18/2022   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   Obstructive jaundice 12/17/2022   Atrial fibrillation 07/26/2022   Presence of Watchman left atrial appendage closure device 07/26/2022   Proteinuria, unspecified 05/23/2022   Simple renal cyst 05/23/2022   Long term current use of anticoagulant 04/04/2022   Rotator cuff syndrome 04/04/2022   Exposure to potentially hazardous substance 04/04/2022   Gout 04/04/2022   Osteoarthritis 04/04/2022   Osteoporosis 04/04/2022   Other specified diseases of hair and hair follicles 0000000   Pain in joint, lower leg 04/04/2022   Problem related to unspecified psychosocial circumstances 04/04/2022   General medical examination for administrative purposes 04/04/2022   Stage 3b chronic kidney disease    Dysphagia, post-stroke    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 hemorrhage of brain 02/26/2022   Cough 123XX123   Acute metabolic encephalopathy 123XX123   Obstructive hydrocephalus 02/19/2022   Dyslipidemia 02/19/2022   Dysphagia 02/19/2022   AKI (acute kidney injury)    Anemia    Pleural effusion on right    Respiratory failure requiring intubation    Cerebral edema    Chronic atrial fibrillation    Controlled type 2 diabetes mellitus with hyperglycemia, without long-term current use of insulin    ICH (intracerebral hemorrhage) 02/10/2022   Intracranial hemorrhage    Anticoagulated  Moderate tricuspid regurgitation 07/26/2021   Moderate aortic valve stenosis 03/08/2020   MDS (myelodysplastic syndrome) 02/18/2020   Macrocytic anemia 01/13/2020   Spleen enlargement 01/13/2020   Bilateral carotid artery stenosis 07/14/2018   Atrial fibrillation, chronic 07/14/2018   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   Essential hypertension 08/16/2014   Hyperlipemia,  mixed 08/16/2014   Renal insufficiency 08/16/2014    ONSET DATE: 02/10/2022  REFERRING DIAG:  CVA  THERAPY DIAG:  Coordination Muscle weakness (generalized)  Other lack of coordination  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  Pt accompanied by: significant other  PERTINENT HISTORY: Pt. is a 77 y. 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 Pt. received outpt. OT services following the CVA for Left sided weakness, and incoordination. Pt. was recently hospitalized from 1/22-1/25/24 for Obstructive Jaundice. PMHx includes: MDS, Chronic A-Fib, Essential HTN, Type II DM, Dyslipidemia, Presence of Watchman Left atrial appendage closure device, GERD  PRECAUTIONS: None  WEIGHT BEARING RESTRICTIONS: No  PAIN: 8/10 pain in L IF when PIP joint triggers Are you having pain? L knee 2/10 pain achy; rest improves pain, walking worsens pain  FALLS: Has patient fallen in last 6 months? No  LIVING ENVIRONMENT: Lives with: lives with their spouse Lives in: House/apartment one storey, can fully live on the 1st floor. Stairs: Ramped entrance  Has following equipment at home: 4 wheeled walker, w/c-manual, grab bars, shower chair, HH shower head, toilet riser with arms.  PLOF: Independent  PATIENT GOALS: To improve arm and hand function   OBJECTIVE:   HAND DOMINANCE: Right  ADLs: Transfers/ambulation related to ADLs: Eating: Difficulty with motor control control Grooming:  Independent UB Dressing:  Diffiulty buttoning when no looking directly at the buttons.  LB Dressing: Independent donning pants, socks, and slide on  shoes. Toileting: Independent Bathing: Independent, wife assists with towel. Tub Shower transfers: Modified Independence  in the shower, CGA transfers out of shoer.   IADLs: Shopping:  Pt.'s wife performs, occasionally uses grocery store scooters Light housekeeping: Wife performs housekeeping tasks. Meal Prep: Prepares light snacks, and light meal prep Community mobility: Relies on family and friends Medication management: Independent Financial management: Does  with wife; No changes in how they are doing them Handwriting: 100% legible for name: Wife has noticed some changes with writing  Hobbies: Plays the guitar, workshop, music shop.  MOBILITY STATUS: Needs Assist: uses a rollator  POSTURE COMMENTS:  No Significant postural limitations Sitting balance: WFL  ACTIVITY TOLERANCE: Activity tolerance:  Limited  FUNCTIONAL OUTCOME MEASURES:  FOTO: 59; TR score: 60 02/06/23: 61  UPPER EXTREMITY ROM:    Active ROM Right Eval  Left Eval [facility]  Shoulder flexion 106   Shoulder abduction 85   Shoulder adduction    Shoulder extension    Shoulder internal rotation    Shoulder external rotation    Elbow flexion WFL   Elbow extension WFL   Wrist flexion    Wrist extension WFL   Wrist ulnar deviation    Wrist radial deviation    Wrist pronation    Wrist supination WFL   (Blank rows = not tested)  Digit flexion to the [facility]:  2nd: 0cm, 3rd: 1.5cm,  4th: 2.5cm, 5th: 0cm   UPPER EXTREMITY MMT:     MMT Right eval Left eval  Shoulder flexion 3- 4  Shoulder abduction 3- 4  Shoulder adduction    Shoulder extension    Shoulder internal rotation    Shoulder external rotation    Middle trapezius    Lower trapezius    Elbow flexion 4 4  Elbow extension 4 4  Wrist flexion    Wrist extension 4 4  Wrist ulnar deviation    Wrist radial deviation    Wrist pronation    Wrist supination    (Blank rows = not tested)  HAND FUNCTION: Grip strength: N/A secondary to arthritis,  and 3rd digit trigger finger    Lateral Pinch: R: 23#, L: 20#  3 Pt. Pinch: R: 22#, L: 21# 02/06/23: Lateral Pinch: R: 22#, L: 20#  3 Pt. Pinch: R: 22#, L: 17#  COORDINATION: 9 Hole Peg test: Right: 25 sec; Left: 2 min. & 34  sec 02/06/23: 9 Hole Peg test: Left: 1 min. & 40 sec  SENSATION: WFL Light touch: WFL Stereognosis: WFL  EDEMA:   N/A  MUSCLE TONE:  Intact  COGNITION: Overall cognitive status: Within functional limits for tasks assessed  VISION: Subjective report: Pt. reports double vision initially at the onset of the CVA with difficulty scanning to left when reading. Pt. Reports this has improved, and the double vision has resolved.   PERCEPTION: WFL  PRAXIS: Impaired motor control, and coordination   TODAY'S TREATMENT:                                                                        Neuromuscular re-education: Facilitated L GMC/FMC working on targeted reaching while formulating a lateral and 3 point pinch with all colors of clothespins, clipping pins onto a vertical and horizontal yardstick.  OT facilitated forward and lateral reaching patterns with repositioning of the yardstick.    Manual Therapy: Soft tissue massage performed at the base of the L IF MCP volar palm, and PIP joint of same digit, working to increase circulation, decrease pain, decrease swelling related to trigger finger in this digit.  Therapeutic Exercise: Performed passive stretching for L IF for flex and extension of the MCP, PIP, and DIP of this finger.  Instructed pt in avoiding reps of repeated gripping and instructed pt in self passive ROM as noted above, and digit abd to target stiffness related to trigger finger.  PATIENT EDUCATION: Education details:  Contrast bath and joint protection to reduce trigger finger symptoms Person educated: vc, Electronics engineer: explanation, demo, vc Education comprehension: verbalized/demonstrated understanding, further training needed  Home  Exercise Program:  Engaging the LUE into daily tasks, use of LUE for reaching/grasping ADL supplies  GOALS: Goals reviewed with patient?  SHORT TERM GOALS: Target date: 02/18/2023   Pt. will demonstrate independence with HEP for LUE functioning Baseline: Eval: No current HEP; 02/06/23: Pt consistently engaging the LUE into grasping and reaching for ADL supplies that can not be damaged if dropped Goal status: ongoing  LONG TERM GOALS: Target date:  04/01/2023  Pt. will improve FOTO score by 1 point for patient perceived improvement with assessment specific ADLs, and IADLs.  Baseline: Eval: FOTO score: 59;  TR score: 60; 02/06/23: 61 Goal status: achieved/ongoing  2.  Pt. will independently modulate the accuracy of holding items without overgripping when reaching for items 50% of the time Baseline: Eval: Pt. is consistently overpowering, and over gripping items when grasping them with the left hand; 02/06/23: pt reports that he can tell he's improved with ability to press his bass strings without too much force.  Continued difficulty using correct force to grasp a cup. Goal status: ongoing  3.  Pt. will improve left hand [facility] skills (Revised: 9 hole in < 1 min 15 sec) (initial: by 10 sec; achieved) to improve left hand grasp on objects.  Baseline: Eval: R: 25 sec. L: 2 min. & 34 sec; 02/06/23: L 1 min 40 sec Goal status: revised  4.  Pt. Will utilize compensatory strategies for left hand function motor coordination 75% during ADLs, and IADL tasks. Baseline: Eval: Minimal use of compensatory strategies during ADLs, and IADLs; 02/06/23: Pt more consistently bearing weight into L forearm when performing [place] tasks to increase distal control. Goal status: ongoing  5.  Pt. Will use compensatory strategies for left hand function 75% of the time when utilizing multiple body systems, dual tasking, and using the hand in a variety of different contexts. Baseline: Eval: Pt. Consistently has difficulty using  his left hand to complete tasks when he is not looking directly at his hand, and has more difficulty using his left hand when standing, or attempting to walk with the walker; 02/06/23: Pt more consistently bearing weight into L forearm when performing [facility] tasks to increase distal control, occasionally will stabilize L wrist with R hand, and initiates rest breaks when LUE is fatigued.  Goal status: ongoing ASSESSMENT:   CLINICAL IMPRESSION: Pt arrived today with a black R eye, stating that he hit himself when his L arm flailed trying to get himself into bed over the weekend.  Pt continues to focus on LUE gross and [facility] skills as he continues to struggle significantly when arm is not supported.  Pt experiencing more pain in the L volar palm at the base of the L IF MCP and PIP joint related to trigger finger.  OT reviewed joint protection strategies and provided education on benefits of contrast bath for swelling and pain reduction in this hand.  Written handout issued and pt plans to perform at home.  Pt tolerated passive stretching and STM to these areas today.  Pt continues to benefit from OT services to improve Left hand function, improve LUE ataxia, use of graded pressure with grasping, and [facility] skills in order to be able to use his hand when challenged in multiple contexts, and with dual tasking. Pt will benefit from education about compensatory strategies for left hand function during ADLs, and IADLs.    PERFORMANCE DEFICITS: in functional skills including ADLs, IADLs, coordination, dexterity, ROM, strength, Fine motor control, and Gross motor control, cognitive skills including attention, memory, problem solving, and safety awareness, and psychosocial skills including coping strategies, environmental adaptation, habits, and routines and behaviors.   IMPAIRMENTS: are limiting patient from ADLs, IADLs, play, leisure, and social participation.   CO-MORBIDITIES: may have co-morbidities  that affects  occupational performance. Patient will benefit from skilled OT to address above impairments and improve overall function.  MODIFICATION OR ASSISTANCE TO COMPLETE EVALUATION: Min-Moderate modification of tasks or assist with assess necessary to complete an evaluation.  OT OCCUPATIONAL PROFILE AND HISTORY: Detailed assessment: Review of records and additional review of physical, cognitive, psychosocial history related  to current functional performance.  CLINICAL DECISION MAKING: Moderate - several treatment options, min-mod task modification necessary  REHAB POTENTIAL: Good  EVALUATION COMPLEXITY: Moderate    PLAN:  OT FREQUENCY: 2x/week  OT DURATION: 12 weeks  PLANNED INTERVENTIONS: self care/ADL training, therapeutic exercise, therapeutic activity, patient/family education, cognitive remediation/compensation, and DME and/or AE instructions, neuromuscular re-education  RECOMMENDED OTHER SERVICES: OT services.  CONSULTED AND AGREED WITH PLAN OF CARE: Patient and family member/caregiver-wife  PLAN FOR NEXT SESSION: Initiate treatment  Leta Speller, MS, OTR/L  Darleene Cleaver, OT 02/26/2023, 11:52 AM

## 2023-02-26 NOTE — Progress Notes (Signed)
Tolerated test well 

## 2023-02-27 ENCOUNTER — Ambulatory Visit: Payer: Medicare Other

## 2023-03-04 ENCOUNTER — Ambulatory Visit: Payer: Medicare Other

## 2023-03-04 ENCOUNTER — Encounter: Payer: Self-pay | Admitting: Occupational Therapy

## 2023-03-04 ENCOUNTER — Ambulatory Visit: Payer: Medicare Other | Admitting: Occupational Therapy

## 2023-03-04 DIAGNOSIS — I69254 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: Secondary | ICD-10-CM

## 2023-03-04 DIAGNOSIS — R278 Other lack of coordination: Secondary | ICD-10-CM

## 2023-03-04 DIAGNOSIS — M6281 Muscle weakness (generalized): Secondary | ICD-10-CM

## 2023-03-04 DIAGNOSIS — R2681 Unsteadiness on feet: Secondary | ICD-10-CM

## 2023-03-04 DIAGNOSIS — R262 Difficulty in walking, not elsewhere classified: Secondary | ICD-10-CM

## 2023-03-04 DIAGNOSIS — R2689 Other abnormalities of gait and mobility: Secondary | ICD-10-CM

## 2023-03-04 NOTE — Therapy (Unsigned)
OUTPATIENT OCCUPATIONAL THERAPY NEURO TREATMENT NOTE   Patient Name: Vernon [name] MRN: [id number] DOB:13-Nov-1946, 77 y.o., male Today's Date: 02/26/2023  REFERRING PROVIDER:  Harrel Lemon, MD  END OF SESSION:  OT End of Session - 02/25/23 1102     Visit Number 14    Number of Visits 24    Date for OT Re-Evaluation 04/01/23    Authorization Time Period Progress reporting period starting 02/06/23    Progress Note Due on Visit 10             Past Medical History:  Diagnosis Date   Anemia    Aortic stenosis    Arthritis    Complication of anesthesia    hard time getting bp up after knee replacement   Coronary artery disease    Diabetes mellitus without complication    GERD (gastroesophageal reflux disease)    occ tums prn   History of hiatal hernia    Hypertension    MDS (myelodysplastic syndrome)    Presence of Watchman left atrial appendage closure device 07/26/2022   56mm Watchman FLX with Dr. Quentin Ore   Past Surgical History:  Procedure Laterality Date   CARPAL TUNNEL RELEASE  2012   CRANIOTOMY N/A 02/10/2022   Procedure: SUBOCCIPITAL CRANIECTOMY FOR EVACUATION OF CEREBELLAR HEMATOMA;  Surgeon: Kristeen Miss, MD;  Location: [facility];  Service: Neurosurgery;  Laterality: N/A;   JOINT REPLACEMENT Right 2010   LEFT ATRIAL APPENDAGE OCCLUSION N/A 07/26/2022   Procedure: LEFT ATRIAL APPENDAGE OCCLUSION;  Surgeon: Vickie Epley, MD;  Location: [place] [facility];  Service: Cardiovascular;  Laterality: N/A;   SHOULDER ARTHROSCOPY WITH ROTATOR CUFF REPAIR AND OPEN BICEPS TENODESIS Right 11/09/2019   Procedure: RIGHT SHOULDER ARTHROSCOPY WITH SUBSCAPULARIS REPAIR, SUBACROMIAL DECOMPRESSION,MINI OPEN ROTATOR CUFF REPAIR;  Surgeon: Leim Fabry, MD;  Location: [facility];  Service: Orthopedics;  Laterality: Right;   TEE WITHOUT CARDIOVERSION N/A 07/26/2022   Procedure: TRANSESOPHAGEAL ECHOCARDIOGRAM (TEE);  Surgeon: Vickie Epley, MD;  Location: [place] [facility];  Service:  Cardiovascular;  Laterality: N/A;   Patient Active Problem List   Diagnosis Date Noted   Urinary tract infection without hematuria 12/19/2022   Painless jaundice 12/18/2022   Elevated LFTs 12/18/2022   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   Obstructive jaundice 12/17/2022   Atrial fibrillation 07/26/2022   Presence of Watchman left atrial appendage closure device 07/26/2022   Proteinuria, unspecified 05/23/2022   Simple renal cyst 05/23/2022   Long term current use of anticoagulant 04/04/2022   Rotator cuff syndrome 04/04/2022   Exposure to potentially hazardous substance 04/04/2022   Gout 04/04/2022   Osteoarthritis 04/04/2022   Osteoporosis 04/04/2022   Other specified diseases of hair and hair follicles 0000000   Pain in joint, lower leg 04/04/2022   Problem related to unspecified psychosocial circumstances 04/04/2022   General medical examination for administrative purposes 04/04/2022   Stage 3b chronic kidney disease    Dysphagia, post-stroke    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 hemorrhage of brain 02/26/2022   Cough 123XX123   Acute metabolic encephalopathy 123XX123   Obstructive hydrocephalus 02/19/2022   Dyslipidemia 02/19/2022   Dysphagia 02/19/2022   AKI (acute kidney injury)    Anemia    Pleural effusion on right    Respiratory failure requiring intubation    Cerebral edema    Chronic atrial fibrillation    Controlled type 2 diabetes mellitus with hyperglycemia, without long-term current use of insulin    ICH (intracerebral hemorrhage) 02/10/2022   Intracranial hemorrhage    Anticoagulated  Moderate tricuspid regurgitation 07/26/2021   Moderate aortic valve stenosis 03/08/2020   MDS (myelodysplastic syndrome) 02/18/2020   Macrocytic anemia 01/13/2020   Spleen enlargement 01/13/2020   Bilateral carotid artery stenosis 07/14/2018   Atrial fibrillation, chronic 07/14/2018   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   Essential hypertension 08/16/2014   Hyperlipemia,  mixed 08/16/2014   Renal insufficiency 08/16/2014    ONSET DATE: 02/10/2022  REFERRING DIAG:  CVA  THERAPY DIAG:  Coordination Muscle weakness (generalized)  Other lack of coordination  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  Pt accompanied by: significant other  PERTINENT HISTORY: Pt. is a 40 y. 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 Pt. received outpt. OT services following the CVA for Left sided weakness, and incoordination. Pt. was recently hospitalized from 1/22-1/25/24 for Obstructive Jaundice. PMHx includes: MDS, Chronic A-Fib, Essential HTN, Type II DM, Dyslipidemia, Presence of Watchman Left atrial appendage closure device, GERD  PRECAUTIONS: None  WEIGHT BEARING RESTRICTIONS: No  PAIN: 8/10 pain in L IF when PIP joint triggers Are you having pain? L knee 2/10 pain achy; rest improves pain, walking worsens pain  FALLS: Has patient fallen in last 6 months? No  LIVING ENVIRONMENT: Lives with: lives with their spouse Lives in: House/apartment one storey, can fully live on the 1st floor. Stairs: Ramped entrance  Has following equipment at home: 4 wheeled walker, w/c-manual, grab bars, shower chair, HH shower head, toilet riser with arms.  PLOF: Independent  PATIENT GOALS: To improve arm and hand function   OBJECTIVE:   HAND DOMINANCE: Right  ADLs: Transfers/ambulation related to ADLs: Eating: Difficulty with motor control control Grooming:  Independent UB Dressing:  Diffiulty buttoning when no looking directly at the buttons.  LB Dressing: Independent donning pants, socks, and slide on  shoes. Toileting: Independent Bathing: Independent, wife assists with towel. Tub Shower transfers: Modified Independence  in the shower, CGA transfers out of shoer.   IADLs: Shopping:  Pt.'s wife performs, occasionally uses grocery store scooters Light housekeeping: Wife performs housekeeping tasks. Meal Prep: Prepares light snacks, and light meal prep Community mobility: Relies on family and friends Medication management: Independent Financial management: Does  with wife; No changes in how they are doing them Handwriting: 100% legible for name: Wife has noticed some changes with writing  Hobbies: Plays the guitar, workshop, music shop.  MOBILITY STATUS: Needs Assist: uses a rollator  POSTURE COMMENTS:  No Significant postural limitations Sitting balance: WFL  ACTIVITY TOLERANCE: Activity tolerance:  Limited  FUNCTIONAL OUTCOME MEASURES:  FOTO: 59; TR score: 60 02/06/23: 61  UPPER EXTREMITY ROM:    Active ROM Right Eval  Left Eval [facility]  Shoulder flexion 106   Shoulder abduction 85   Shoulder adduction    Shoulder extension    Shoulder internal rotation    Shoulder external rotation    Elbow flexion WFL   Elbow extension WFL   Wrist flexion    Wrist extension WFL   Wrist ulnar deviation    Wrist radial deviation    Wrist pronation    Wrist supination WFL   (Blank rows = not tested)  Digit flexion to the [facility]:  2nd: 0cm, 3rd: 1.5cm,  4th: 2.5cm, 5th: 0cm   UPPER EXTREMITY MMT:     MMT Right eval Left eval  Shoulder flexion 3- 4  Shoulder abduction 3- 4  Shoulder adduction    Shoulder extension    Shoulder internal rotation    Shoulder external rotation    Middle trapezius    Lower trapezius    Elbow flexion 4 4  Elbow extension 4 4  Wrist flexion    Wrist extension 4 4  Wrist ulnar deviation    Wrist radial deviation    Wrist pronation    Wrist supination    (Blank rows = not tested)  HAND FUNCTION: Grip strength: N/A secondary to arthritis,  and 3rd digit trigger finger    Lateral Pinch: R: 23#, L: 20#  3 Pt. Pinch: R: 22#, L: 21# 02/06/23: Lateral Pinch: R: 22#, L: 20#  3 Pt. Pinch: R: 22#, L: 17#  COORDINATION: 9 Hole Peg test: Right: 25 sec; Left: 2 min. & 34  sec 02/06/23: 9 Hole Peg test: Left: 1 min. & 40 sec  SENSATION: WFL Light touch: WFL Stereognosis: WFL  EDEMA:   N/A  MUSCLE TONE:  Intact  COGNITION: Overall cognitive status: Within functional limits for tasks assessed  VISION: Subjective report: Pt. reports double vision initially at the onset of the CVA with difficulty scanning to left when reading. Pt. Reports this has improved, and the double vision has resolved.   PERCEPTION: WFL  PRAXIS: Impaired motor control, and coordination   TODAY'S TREATMENT:                                                                        Neuromuscular re-education: Facilitated L GMC/FMC working on targeted reaching while formulating a lateral and 3 point pinch with all colors of clothespins, clipping pins onto a vertical and horizontal yardstick.  OT facilitated forward and lateral reaching patterns with repositioning of the yardstick.    Manual Therapy: Soft tissue massage performed at the base of the L IF MCP volar palm, and PIP joint of same digit, working to increase circulation, decrease pain, decrease swelling related to trigger finger in this digit.  Therapeutic Exercise: Performed passive stretching for L IF for flex and extension of the MCP, PIP, and DIP of this finger.  Instructed pt in avoiding reps of repeated gripping and instructed pt in self passive ROM as noted above, and digit abd to target stiffness related to trigger finger.  PATIENT EDUCATION: Education details:  Contrast bath and joint protection to reduce trigger finger symptoms Person educated: vc, Electronics engineer: explanation, demo, vc Education comprehension: verbalized/demonstrated understanding, further training needed  Home  Exercise Program:  Engaging the LUE into daily tasks, use of LUE for reaching/grasping ADL supplies  GOALS: Goals reviewed with patient?  SHORT TERM GOALS: Target date: 02/18/2023   Pt. will demonstrate independence with HEP for LUE functioning Baseline: Eval: No current HEP; 02/06/23: Pt consistently engaging the LUE into grasping and reaching for ADL supplies that can not be damaged if dropped Goal status: ongoing  LONG TERM GOALS: Target date:  04/01/2023  Pt. will improve FOTO score by 1 point for patient perceived improvement with assessment specific ADLs, and IADLs.  Baseline: Eval: FOTO score: 59;  TR score: 60; 02/06/23: 61 Goal status: achieved/ongoing  2.  Pt. will independently modulate the accuracy of holding items without overgripping when reaching for items 50% of the time Baseline: Eval: Pt. is consistently overpowering, and over gripping items when grasping them with the left hand; 02/06/23: pt reports that he can tell he's improved with ability to press his bass strings without too much force.  Continued difficulty using correct force to grasp a cup. Goal status: ongoing  3.  Pt. will improve left hand [facility] skills (Revised: 9 hole in < 1 min 15 sec) (initial: by 10 sec; achieved) to improve left hand grasp on objects.  Baseline: Eval: R: 25 sec. L: 2 min. & 34 sec; 02/06/23: L 1 min 40 sec Goal status: revised  4.  Pt. Will utilize compensatory strategies for left hand function motor coordination 75% during ADLs, and IADL tasks. Baseline: Eval: Minimal use of compensatory strategies during ADLs, and IADLs; 02/06/23: Pt more consistently bearing weight into L forearm when performing [place] tasks to increase distal control. Goal status: ongoing  5.  Pt. Will use compensatory strategies for left hand function 75% of the time when utilizing multiple body systems, dual tasking, and using the hand in a variety of different contexts. Baseline: Eval: Pt. Consistently has difficulty using  his left hand to complete tasks when he is not looking directly at his hand, and has more difficulty using his left hand when standing, or attempting to walk with the walker; 02/06/23: Pt more consistently bearing weight into L forearm when performing [facility] tasks to increase distal control, occasionally will stabilize L wrist with R hand, and initiates rest breaks when LUE is fatigued.  Goal status: ongoing ASSESSMENT:   CLINICAL IMPRESSION: Pt arrived today with a black R eye, stating that he hit himself when his L arm flailed trying to get himself into bed over the weekend.  Pt continues to focus on LUE gross and [facility] skills as he continues to struggle significantly when arm is not supported.  Pt experiencing more pain in the L volar palm at the base of the L IF MCP and PIP joint related to trigger finger.  OT reviewed joint protection strategies and provided education on benefits of contrast bath for swelling and pain reduction in this hand.  Written handout issued and pt plans to perform at home.  Pt tolerated passive stretching and STM to these areas today.  Pt continues to benefit from OT services to improve Left hand function, improve LUE ataxia, use of graded pressure with grasping, and [facility] skills in order to be able to use his hand when challenged in multiple contexts, and with dual tasking. Pt will benefit from education about compensatory strategies for left hand function during ADLs, and IADLs.    PERFORMANCE DEFICITS: in functional skills including ADLs, IADLs, coordination, dexterity, ROM, strength, Fine motor control, and Gross motor control, cognitive skills including attention, memory, problem solving, and safety awareness, and psychosocial skills including coping strategies, environmental adaptation, habits, and routines and behaviors.   IMPAIRMENTS: are limiting patient from ADLs, IADLs, play, leisure, and social participation.   CO-MORBIDITIES: may have co-morbidities  that affects  occupational performance. Patient will benefit from skilled OT to address above impairments and improve overall function.  MODIFICATION OR ASSISTANCE TO COMPLETE EVALUATION: Min-Moderate modification of tasks or assist with assess necessary to complete an evaluation.  OT OCCUPATIONAL PROFILE AND HISTORY: Detailed assessment: Review of records and additional review of physical, cognitive, psychosocial history related  to current functional performance.  CLINICAL DECISION MAKING: Moderate - several treatment options, min-mod task modification necessary  REHAB POTENTIAL: Good  EVALUATION COMPLEXITY: Moderate    PLAN:  OT FREQUENCY: 2x/week  OT DURATION: 12 weeks  PLANNED INTERVENTIONS: self care/ADL training, therapeutic exercise, therapeutic activity, patient/family education, cognitive remediation/compensation, and DME and/or AE instructions, neuromuscular re-education  RECOMMENDED OTHER SERVICES: OT services.  CONSULTED AND AGREED WITH PLAN OF CARE: Patient and family member/caregiver-wife  PLAN FOR NEXT SESSION: Initiate treatment  Leta Speller, MS, OTR/L  Darleene Cleaver, OT 02/26/2023, 11:52 AM

## 2023-03-04 NOTE — Therapy (Signed)
OUTPATIENT PHYSICAL THERAPY TREATMENT  Patient Name: Vernon [name] MRN: [id number] DOB:1946/10/19, 77 y.o., male Today's Date: 03/04/2023   PCP:  Gracelyn Nurse, MD   REFERRING PROVIDER:  Gracelyn Nurse, MD    END OF SESSION:  PT End of Session - 03/04/23 1017     Visit Number 14    Number of Visits 25    Date for PT Re-Evaluation 04/03/23    Authorization Type [employer]    Progress Note Due on Visit 20    PT Start Time 1017    PT Stop Time 1059    PT Time Calculation (min) 42 min    Equipment Utilized During Treatment Gait belt    Activity Tolerance Patient tolerated treatment well    Behavior During Therapy WFL for tasks assessed/performed                Past Medical History:  Diagnosis Date   Anemia    Aortic stenosis    Arthritis    Complication of anesthesia    hard time getting bp up after knee replacement   Coronary artery disease    Diabetes mellitus without complication    GERD (gastroesophageal reflux disease)    occ tums prn   History of hiatal hernia    Hypertension    MDS (myelodysplastic syndrome)    Presence of Watchman left atrial appendage closure device 07/26/2022   12mm Watchman FLX with Dr. Lalla Brothers   Past Surgical History:  Procedure Laterality Date   CARPAL TUNNEL RELEASE  2012   CRANIOTOMY N/A 02/10/2022   Procedure: SUBOCCIPITAL CRANIECTOMY FOR EVACUATION OF CEREBELLAR HEMATOMA;  Surgeon: Barnett Abu, MD;  Location: [facility] OR;  Service: Neurosurgery;  Laterality: N/A;   JOINT REPLACEMENT Right 2010   LEFT ATRIAL APPENDAGE OCCLUSION N/A 07/26/2022   Procedure: LEFT ATRIAL APPENDAGE OCCLUSION;  Surgeon: Lanier Prude, MD;  Location: [facility] INVASIVE CV LAB;  Service: Cardiovascular;  Laterality: N/A;   SHOULDER ARTHROSCOPY WITH ROTATOR CUFF REPAIR AND OPEN BICEPS TENODESIS Right 11/09/2019   Procedure: RIGHT SHOULDER ARTHROSCOPY WITH SUBSCAPULARIS REPAIR, SUBACROMIAL DECOMPRESSION,MINI OPEN ROTATOR CUFF REPAIR;  Surgeon: Signa Kell, MD;  Location: [facility] ORS;  Service: Orthopedics;  Laterality: Right;   TEE WITHOUT CARDIOVERSION N/A 07/26/2022   Procedure: TRANSESOPHAGEAL ECHOCARDIOGRAM (TEE);  Surgeon: Lanier Prude, MD;  Location: [facility] INVASIVE CV LAB;  Service: Cardiovascular;  Laterality: N/A;   Patient Active Problem List   Diagnosis Date Noted   Urinary tract infection without hematuria 12/19/2022   Painless jaundice 12/18/2022   Elevated LFTs 12/18/2022   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   Obstructive jaundice 12/17/2022   Atrial fibrillation 07/26/2022   Presence of Watchman left atrial appendage closure device 07/26/2022   Proteinuria, unspecified 05/23/2022   Simple renal cyst 05/23/2022   Long term current use of anticoagulant 04/04/2022   Rotator cuff syndrome 04/04/2022   Exposure to potentially hazardous substance 04/04/2022   Gout 04/04/2022   Osteoarthritis 04/04/2022   Osteoporosis 04/04/2022   Other specified diseases of hair and hair follicles 04/04/2022   Pain in joint, lower leg 04/04/2022   Problem related to unspecified psychosocial circumstances 04/04/2022   General medical examination for administrative purposes 04/04/2022   Stage 3b chronic kidney disease    Dysphagia, post-stroke    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 hemorrhage of brain 02/26/2022   Cough 02/20/2022   Acute metabolic encephalopathy 02/20/2022   Obstructive hydrocephalus 02/19/2022   Dyslipidemia 02/19/2022   Dysphagia 02/19/2022   AKI (acute kidney injury)  Anemia    Pleural effusion on right    Respiratory failure requiring intubation    Cerebral edema    Chronic atrial fibrillation    Controlled type 2 diabetes mellitus with hyperglycemia, without long-term current use of insulin    ICH (intracerebral hemorrhage) 02/10/2022   Intracranial hemorrhage    Anticoagulated    Moderate tricuspid regurgitation 07/26/2021   Moderate aortic valve stenosis 03/08/2020   MDS (myelodysplastic syndrome) 02/18/2020   Macrocytic  anemia 01/13/2020   Spleen enlargement 01/13/2020   Bilateral carotid artery stenosis 07/14/2018   Atrial fibrillation, chronic 07/14/2018   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   Essential hypertension 08/16/2014   Hyperlipemia, mixed 08/16/2014   Renal insufficiency 08/16/2014    ONSET DATE: 02/10/2022  REFERRING DIAG:  I63.9 (ICD-10-CM) - Cerebral infarction, unspecified  I63.9 (ICD-10-CM) - CVA (cerebral vascular accident) (HCC)    THERAPY DIAG:  Muscle weakness (generalized)  Other lack of coordination  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  Difficulty in walking, not elsewhere classified  Other abnormalities of gait and mobility  Unsteadiness on feet  Rationale for Evaluation and Treatment: Rehabilitation  SUBJECTIVE:                                                                                                                                                                                             SUBJECTIVE STATEMENT: Patient reports feeling okay today without significant difficulty- No falls reported    PERTINENT HISTORY:  Vernon [name] is a 77yoM 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 Pt hospitalized 1/22-1/25/24 for obstructive jaundice. Pt reports it was found he had a UTI with recent hospital stay. He wants to improve his endurance and balance. PMH significant for MDS, chronic a-fib, HTN, DMII, Presence of Watchman Left atrial appendage closure device, anemia, arthritis, hx of hiatal hernia, hx craniotomy 02/10/22, L atrial appendage occlusion 07/26/22, TEE without cardioversion 07/26/2022.     PAIN:  Are you having pain? Yes, 2/10 Left knee resting, worse with standing, walking, terminal extension  PRECAUTIONS: Fall  WEIGHT BEARING RESTRICTIONS: No  FALLS: Has patient fallen in last 6 months? No  PATIENT GOALS: improve  coordination, endurance and balance  OBJECTIVE:    INTERVENTION THIS DATE:     NMR:   Step tap with BUE support initially for 1st 5 reps then  x 20 reps without UE support. Side stepping over 1/2 foam without UE support and using 4#AW Left LE x 15 reps (min UE support intermittently)  Soccer ball alt LE tap- min to no UE  support  x10 each  Kick soccer ball against wall (standing approx 2 -3 feet away) x 1 min x 2 trials  Therex:   Seated LAQ with 4#AW with 3 sec hold x 10 reps Seated hip flex/abd up/over 1/2 foam with 4# AW x 12 reps each LE Seated hip flex with 4# AW 2 sets of 10 reps Seated ham curl with RTB x 15 reps each LE Sit to stand x 10 reps (patient obvious fatigue) x 2 sets  Resistive gait using upright 4WW and 4# AW x 450 feet  then at end of session x 300 feet - VC for step length yet improved posture with use of upright 4WW   PATIENT EDUCATION: Education details: exam findings, plan, testing Person educated: Patient and Spouse Education method: Explanation Education comprehension: verbalized understanding  HOME EXERCISE PROGRAM: To be initiated next 1-2 visits   GOALS: Goals reviewed with patient? Yes  SHORT TERM GOALS: Target date: 02/20/2023  Patient will be independent in home exercise program to improve strength/mobility for better functional independence with ADLs. Baseline: to be intiated; 02/13/2023= Patient reports no issues with current HEP.  Goal status: MET   LONG TERM GOALS: Target date: 04/03/2023   Patient will complete five times sit to stand test in < 10 seconds indicating an increased LE strength and improved balance. Baseline: 14.89 sec; 02/13/2023= 11.99 without UE Support Goal status: Progressing  2.  Patient will increase 10 meter walk test to >1.680m/s as to improve gait speed for better community ambulation and to reduce fall risk. Baseline:  0.82 m/s; 02/13/2023= 1.03 m/s with RW Goal status: MET  3.  Patient will increase Berg  Balance score by > 6 points to demonstrate decreased fall risk during functional activities. Baseline: 39/56; 02/13/2023=41/56 Goal status: Progressing  4.  Patient will increase six minute walk test distance to >1000 for progression to community ambulator and improve gait ability Baseline: 795'; 02/13/2023= 890 feet with RW and 2 brief standing rest breaks Goal status: Progressing  5.  Patient will demonstrate improved function as evidenced by a score of 62 on FOTO measure for full participation in activities at home and in the community. Baseline: 59; 02/13/2023=62 Goal status: Progressing  ASSESSMENT:  CLINICAL IMPRESSION:  Patient performed well overall- able to improve his dynamic balance with VC and practice. He was initially unsteady requiring some CGA but improved with practice with each activity today. He was limited some by fatigue yet fewer rest breaks.   Pt will continue to benefit from skilled PT services to address deficits and impairment identified in evaluation in order to maximize independence and safety in basic mobility required for performance of ADL, IADL, and leisure.     OBJECTIVE IMPAIRMENTS: Abnormal gait, decreased activity tolerance, decreased balance, decreased coordination, decreased endurance, decreased knowledge of use of DME, decreased mobility, difficulty walking, decreased strength, impaired UE functional use, improper body mechanics, postural dysfunction, and pain.   ACTIVITY LIMITATIONS: carrying, lifting, bending, standing, squatting, stairs, transfers, and locomotion level  PARTICIPATION LIMITATIONS: meal prep, cleaning, laundry, driving, shopping, community activity, and yard work  PERSONAL FACTORS: Age, Past/current experiences, Time since onset of injury/illness/exacerbation, and 3+ comorbidities: PMH significant for MDS, chronic a-fib, HTN, DMII, Presence of Watchman Left atrial appendage closure device, anemia, arthritis, hx of hiatal hernia, hx  craniotomy 02/10/22, L atrial appendage occlusion 07/26/22, TEE without cardioversion 07/26/2022  are also affecting patient's functional outcome.   REHAB POTENTIAL: Good  CLINICAL DECISION MAKING: Evolving/moderate complexity  EVALUATION COMPLEXITY:  Moderate  PLAN:  PT FREQUENCY: 2x/week  PT DURATION: 12 weeks  PLANNED INTERVENTIONS: Therapeutic exercises, Therapeutic activity, Neuromuscular re-education, Balance training, Gait training, Patient/Family education, Self Care, Joint mobilization, Stair training, Vestibular training, Canalith repositioning, Visual/preceptual remediation/compensation, Orthotic/Fit training, DME instructions, Electrical stimulation, Wheelchair mobility training, Spinal mobilization, Cryotherapy, Moist heat, Splintting, Taping, Ultrasound, Manual therapy, and Re-evaluation  PLAN FOR NEXT SESSION:  Continue with strengthening exercises and balance related tasks as tolerated given his left knee DJD limitations  Louis Meckel, PT Physical Therapist - [place]  [facility]  1:41 PM 03/04/23    03/04/23, 1:41 PM

## 2023-03-05 ENCOUNTER — Other Ambulatory Visit: Payer: Self-pay

## 2023-03-05 DIAGNOSIS — D469 Myelodysplastic syndrome, unspecified: Secondary | ICD-10-CM

## 2023-03-06 ENCOUNTER — Encounter: Payer: Medicare Other | Admitting: Occupational Therapy

## 2023-03-06 ENCOUNTER — Inpatient Hospital Stay: Payer: Medicare Other | Attending: Internal Medicine

## 2023-03-06 ENCOUNTER — Inpatient Hospital Stay (HOSPITAL_BASED_OUTPATIENT_CLINIC_OR_DEPARTMENT_OTHER): Payer: Medicare Other | Admitting: Internal Medicine

## 2023-03-06 ENCOUNTER — Ambulatory Visit: Payer: Medicare Other

## 2023-03-06 ENCOUNTER — Inpatient Hospital Stay: Payer: Medicare Other

## 2023-03-06 VITALS — BP 150/61 | HR 55 | Temp 97.3°F | Wt 193.7 lb

## 2023-03-06 DIAGNOSIS — D461 Refractory anemia with ring sideroblasts: Secondary | ICD-10-CM | POA: Diagnosis present

## 2023-03-06 DIAGNOSIS — D469 Myelodysplastic syndrome, unspecified: Secondary | ICD-10-CM

## 2023-03-06 LAB — BASIC METABOLIC PANEL - CANCER CENTER ONLY
Anion gap: 7 (ref 5–15)
BUN: 23 mg/dL (ref 8–23)
CO2: 24 mmol/L (ref 22–32)
Calcium: 9.1 mg/dL (ref 8.9–10.3)
Chloride: 106 mmol/L (ref 98–111)
Creatinine: 1.21 mg/dL (ref 0.61–1.24)
GFR, Estimated: >60 mL/min (ref >60)
Glucose, Bld: 129 mg/dL — ABNORMAL HIGH (ref 70–99)
Potassium: 4.2 mmol/L (ref 3.5–5.1)
Sodium: 137 mmol/L (ref 135–145)

## 2023-03-06 LAB — IRON AND TIBC
Iron: 203 ug/dL — ABNORMAL HIGH (ref 45–182)
Saturation Ratios: 69 % — ABNORMAL HIGH (ref 17.9–39.5)
TIBC: 295 ug/dL (ref 250–450)
UIBC: 92 ug/dL

## 2023-03-06 LAB — CBC (CANCER CENTER ONLY)
HCT: 29.5 % — ABNORMAL LOW (ref 39.0–52.0)
Hemoglobin: 9.6 g/dL — ABNORMAL LOW (ref 13.0–17.0)
MCH: 30.1 pg (ref 26.0–34.0)
MCHC: 32.5 g/dL (ref 30.0–36.0)
MCV: 92.5 fL (ref 80.0–100.0)
Platelet Count: 233 10*3/uL (ref 150–400)
RBC: 3.19 MIL/uL — ABNORMAL LOW (ref 4.22–5.81)
RDW: 27.7 % — ABNORMAL HIGH (ref 11.5–15.5)
WBC Count: 5 10*3/uL (ref 4.0–10.5)
nRBC: 0 % (ref 0.0–0.2)

## 2023-03-06 LAB — VITAMIN B12: Vitamin B-12: 968 pg/mL — ABNORMAL HIGH (ref 180–914)

## 2023-03-06 LAB — FERRITIN: Ferritin: 1035 ng/mL — ABNORMAL HIGH (ref 24–336)

## 2023-03-06 MED ORDER — DARBEPOETIN ALFA 100 MCG/0.5ML IJ SOSY
100.0000 ug | PREFILLED_SYRINGE | Freq: Once | INTRAMUSCULAR | Status: AC
  Administered 2023-03-06: 100 ug via SUBCUTANEOUS
  Filled 2023-03-06: qty 0.5

## 2023-03-06 NOTE — Assessment & Plan Note (Addendum)
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  Patient currently on Retacrit [since April 2021]; hemoglobin between 8-9.  Patient is fairly asymptomatic. Stable. Continue Retacrit every 2 weeks.  # Discussed with the patient that if he gets more fatigue/symptomatic the options include Luspatercept/Revlimid.  #Today hemoglobin is 9.5- stable Continue Aranesp for now.  OCT 2023- B12 790; Ferritin-826  [recently started Fe/B12]  Okay to come off of oral iron.  Unlikely Aranesp to affect his bilirubin.   # HTN- at home in 140s; BP- 160s- monitor closely. stable  # Intracranial bleed while on Eliquis/Hx of stroke s/p evacuation- [at home > 2 months]- OFF ELIQUIS; on asprin.  stable  # Hx Of A.fib [OFF eliquis secondary to intracranial bleed]- s/p watchman device- on asprin ONLY- stable  # Stage kidney disease-stage III [dr.Korrapati]-stable  # Elevated bilirubin- [JAN 2024] liver suggesting underlying cirrhosis. No signs of biliary tract obstruction vs-gallbladder sludge.? Enzyme deficient- triggered by stress/UTI.  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 Repeat abdominal MRI with and without IV gadolinium in JAN 2025.   # Numerous tiny T2 hyperintensities scattered throughout the Spleen- benign lesions  small cavernous hemangiomas or lymphangioma-   # DISPOSITION:  # aranesp today # in 2 week- lab- H&H; possible aranesp # in 4 weeks- lab- H&H; possible aranesp # in 6 weeks- lab- H&H; possible aranesp # in 8 weeks- lab- H&H; possible aranesp # in 10 weeks- lab- H&H; possible aranesp # # in 12  weeks- MD; labs-cbc/bmp/ aranesp;;iron studies/ferritin; B12 levels-Dr.B

## 2023-03-06 NOTE — Progress Notes (Signed)
[facility] CONSULT NOTE  Patient Care Team: Gracelyn Nurse, MD as PCP - General (Internal Medicine) Lanier Prude, MD as PCP - Electrophysiology (Cardiology) Earna Coder, MD as Consulting Physician (Hematology and Oncology)  CHIEF COMPLAINTS/PURPOSE OF CONSULTATION: MDS  Oncology History Overview Note  # EGD-none; colonoscopy-never [Cologurad x2- NEG];FEB 2021- US- abdomen-no liver disease/ Mild splenic enlargement [460cc; kidney cysts]; B12 folic acid/LDH haptoglobin normal.  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   # Hx Of A.fib [OFF eliquis secondary to intracranial bleed]- s/p watchman device- on asprin ONLY- stable   # NGS/MOLECULAR TESTS:[facility] ON hem- May 2021- Cux-1; DNMT3A; SF3B**  # PALLIATIVE CARE EVALUATION: NA   DIAGNOSIS: MDS low risk   GOALS: Control  CURRENT/MOST RECENT THERAPY : Retacrit    MDS (myelodysplastic syndrome)  02/18/2020 Initial Diagnosis   MDS (myelodysplastic syndrome), low grade (HCC)     HISTORY OF PRESENTING ILLNESS: Ambulating in a wheelchair.  Alone.   Vernon [name] 77 y.o.  male with history of recently diagnosed low-grade MDS; A-fib on Eliquis currently on Retacrit is here for follow-up.  No complaints. Wife wants to know if this treatment can affect his Bilirubin.  In the interim patient was evaluated in the emergency room in January 2024 for a UTI.  Incidentally patient was noted to have elevated bilirubin of 13-14.  Extensive workup negative for any acute process.  Patient otherwise doing well.  Denies any bleeding or  bruising.  Denies any worsening fatigue.  Denies any nausea vomiting abdominal pain.  No weight loss.  No worsening shortness of breath or cough.  Review of Systems  Constitutional:  Positive for malaise/fatigue. Negative for chills, diaphoresis, fever and weight loss.  HENT:  Negative for nosebleeds and sore throat.   Eyes:  Negative for double vision.  Respiratory:  Negative for cough, hemoptysis, sputum production, shortness of breath and wheezing.   Cardiovascular:  Negative for chest pain, palpitations, orthopnea and leg swelling.  Gastrointestinal:  Negative for abdominal pain, blood in stool, constipation, diarrhea, heartburn, melena, nausea and vomiting.  Genitourinary:  Negative for dysuria, frequency and urgency.  Musculoskeletal:  Negative for back pain and joint pain.  Skin: Negative.  Negative for itching and rash.  Neurological:  Negative for dizziness, tingling, focal weakness, weakness and headaches.  Endo/Heme/Allergies:  Does not bruise/bleed easily.  Psychiatric/Behavioral:  Negative for depression. The patient is not nervous/anxious and does not have insomnia.     MEDICAL HISTORY:  Past Medical History:  Diagnosis Date   Anemia    Aortic stenosis    Arthritis    Complication of anesthesia    hard time getting bp up after knee replacement   Coronary artery disease    Diabetes mellitus without complication    GERD (gastroesophageal reflux disease)    occ tums prn   History of hiatal hernia    Hypertension    MDS (myelodysplastic syndrome)    Presence of Watchman left atrial appendage closure device 07/26/2022   23mm Watchman FLX with Dr. 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;  Surgeon: Barnett Abu, MD;  Location: [facility] OR;  Service: Neurosurgery;  Laterality: N/A;   JOINT REPLACEMENT  Right 2010   LEFT ATRIAL APPENDAGE OCCLUSION N/A 07/26/2022   Procedure: LEFT ATRIAL APPENDAGE OCCLUSION;  Surgeon: Lanier Prude, MD;  Location: [facility] INVASIVE CV LAB;  Service: Cardiovascular;  Laterality: N/A;   SHOULDER ARTHROSCOPY WITH ROTATOR CUFF REPAIR AND OPEN BICEPS TENODESIS Right 11/09/2019   Procedure: RIGHT SHOULDER ARTHROSCOPY WITH SUBSCAPULARIS REPAIR, SUBACROMIAL DECOMPRESSION,MINI OPEN ROTATOR CUFF REPAIR;  Surgeon: Signa Kell, MD;  Location: [facility] ORS;  Service: Orthopedics;  Laterality: Right;   TEE WITHOUT CARDIOVERSION N/A 07/26/2022   Procedure: TRANSESOPHAGEAL ECHOCARDIOGRAM (TEE);  Surgeon: Lanier Prude, MD;  [facility]: [facility] INVASIVE CV LAB;  Service: Cardiovascular;  Laterality: N/A;    SOCIAL HISTORY: Social History   Socioeconomic History   Marital status: Married    Spouse name: Not on file   Number of children: 2   Years of education: Not on file   Highest education level: Not on file  Occupational History   Occupation: Retired - [employer]  Tobacco Use   Smoking status: Former    Packs/day: 0.50    Years: 8.00    Additional pack years: 0.00    Total pack years: 4.00    Types: Cigarettes    Quit date: 07/21/1978    Years since quitting: 44.6   Smokeless tobacco: Never  Vaping Use   Vaping Use: Never used  Substance and Sexual Activity   Alcohol use: Yes    Alcohol/week: 0.0 - 1.0 standard drinks of alcohol    Comment: rare beer   Drug use: Never   Sexual activity: Not on file  Other Topics Concern   Not on file  Social History Narrative   Lives in [place]; with wife; quit smoking in early 40s; ocassional/ rare [may be 1 a month beer]. 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    Social Determinants of Health   Financial Resource Strain: Not on file  Food Insecurity: No Food Insecurity (12/18/2022)   Hunger Vital Sign    Worried About Running Out of Food in the Last Year: Never true    Ran Out of Food in the Last Year: Never true   Transportation Needs: No Transportation Needs (12/18/2022)   PRAPARE - Administrator, Civil Service (Medical): No    Lack of Transportation (Non-Medical): No  Physical Activity: Not on file  Stress: Not on file  Social Connections: Not on file  Intimate Partner Violence: Not At Risk (12/18/2022)   Humiliation, Afraid, Rape, and Kick questionnaire    Fear of Current or Ex-Partner: No    Emotionally Abused: No    Physically Abused: No    Sexually Abused: No    FAMILY HISTORY: Family History  Problem Relation Age of Onset   Cancer Maternal Uncle     ALLERGIES:  has No Known Allergies.  MEDICATIONS:  Current Outpatient Medications  Medication Sig Dispense Refill   aspirin EC 81 MG tablet Take 1 tablet (81 mg total) by mouth daily. Swallow whole. 90 tablet 3   furosemide (LASIX) 20 MG tablet Take 1 tablet (20 mg total) by mouth daily as needed. 90 tablet 3   metFORMIN (GLUCOPHAGE) 500 MG tablet Take 500 mg by mouth 2 (two) times daily with a meal.     Multiple Vitamin (MULTI-VITAMIN) tablet Take 1 tablet by mouth daily.  ondansetron (ZOFRAN-ODT) 4 MG disintegrating tablet Take 4 mg by mouth every 8 (eight) hours as needed.     polyethylene glycol powder (GLYCOLAX/MIRALAX) 17 GM/SCOOP powder Take 1 Container by mouth daily as needed.     Current Facility-Administered Medications  Medication Dose Route Frequency Provider Last Rate Last Admin   0.9 %  sodium chloride infusion   Intravenous Continuous Lanier Prude, MD       0.9 %  sodium chloride infusion   Intravenous Continuous Lanier Prude, MD          PHYSICAL EXAMINATION:   Vitals:   03/06/23 1010 03/06/23 1044  BP: (!) 148/100 (!) 150/61  Pulse: (!) 55   Temp: (!) 97.3 F (36.3 C)   SpO2: 94%    Filed Weights   03/06/23 1010  Weight: 193 lb 11.2 oz (87.9 kg)    Physical Exam HENT:     Head: Normocephalic and atraumatic.     Mouth/Throat:     Pharynx: No oropharyngeal exudate.   Eyes:     Pupils: Pupils are equal, round, and reactive to light.  Cardiovascular:     Rate and Rhythm: Normal rate and regular rhythm.  Pulmonary:     Effort: Pulmonary effort is normal. No respiratory distress.     Breath sounds: Normal breath sounds. No wheezing.  Abdominal:     General: Bowel sounds are normal. There is no distension.     Palpations: Abdomen is soft. There is no mass.     Tenderness: There is no abdominal tenderness. There is no guarding or rebound.  Musculoskeletal:        General: No tenderness. Normal range of motion.     Cervical back: Normal range of motion and neck supple.  Skin:    General: Skin is warm.  Neurological:     Mental Status: He is alert and oriented to person, place, and time.  Psychiatric:        Mood and Affect: Affect normal.    LABORATORY DATA:  I have reviewed the data as listed Lab Results  Component Value Date   WBC 5.0 03/06/2023   HGB 9.6 (L) 03/06/2023   HCT 29.5 (L) 03/06/2023   MCV 92.5 03/06/2023   PLT 233 03/06/2023   Recent Labs    12/17/22 2041 12/18/22 0556 12/19/22 1029 12/26/22 1119 01/09/23 1008 03/06/23 0953  NA 135   < > 138  --  140 137  K 4.5   < > 4.2  --  4.5 4.2  CL 100   < > 105  --  106 106  CO2 23   < > 22  --  25 24  GLUCOSE 113*   < > 130*  --  150* 129*  BUN 47*   < > 32*  --  25* 23  CREATININE 1.75*   < > 1.32*  --  1.25* 1.21  CALCIUM 9.3   < > 8.7*  --  9.2 9.1  GFRNONAA 40*   < > 56*  --  60* >60  PROT 8.3*   < > 7.5 6.8 7.6  --   ALBUMIN 4.4   < > 3.8 4.4 4.1  --   AST 102*   < > 53* 48* 29  --   ALT 105*   < > 65* 47* 21  --   ALKPHOS 284*   < > 290* 397* 169*  --   BILITOT 13.1*   < > 8.3* 3.6* 2.3*  --  BILIDIR 7.1*  --   --  1.83* 0.7*  --   IBILI  --   --   --   --  1.6*  --    < > = values in this interval not displayed.     NM PET CT CARDIAC PERFUSION MULTI W/ABSOLUTE BLOODFLOW  Result Date: 02/26/2023 CLINICAL DATA:  This over-read does not include interpretation of  cardiac or coronary anatomy or pathology. Over-read does not include interpretation of the PET portion of the exam. The Cardiac PET CT interpretation by the cardiologist is attached. COMPARISON:  Cardiac CT dated September 26, 2022 FINDINGS: Vascular: Cardiomegaly. Small pericardial effusion, similar to prior exam. Normal caliber thoracic aorta with severe calcified plaque. Severe left main and three-vessel coronary artery calcifications. Aortic valve calcifications. Left atrial appendage occlusion device. Mediastinum/Nodes: Esophagus is unremarkable. No enlarged lymph nodes seen in the chest. Lungs/Pleura: Bibasilar atelectasis. No consolidation, pleural effusion or pneumothorax. Upper Abdomen: No acute abnormality. Musculoskeletal: No aggressive appearing osseous lesions IMPRESSION: No acute extracardiac abnormality. Electronically Signed   By: Allegra LaiLeah  Strickland M.D.   On: 02/26/2023 15:12   ECHOCARDIOGRAM COMPLETE  Result Date: 02/18/2023    ECHOCARDIOGRAM REPORT   Patient Name:   Vernon AdenLARRY D Dinse Date of Exam: 02/18/2023 Medical Rec #:  [id number]     Height:       69.0 in Accession #:    [id number]    Weight:       200.0 lb Date of Birth:  12/14/1945     BSA:          2.066 m Patient Age:    76 years      BP:           182/89 mmHg Patient Gender: M             HR:           59 bpm. Exam Location:  [address] Procedure: 2D Echo, Cardiac Doppler and Color Doppler Indications:    786.05 SOB  History:        Patient has prior history of Echocardiogram examinations, most                 recent 02/11/2022. CAD, Stroke, Aortic Valve Disease,                 Signs/Symptoms:Murmur; Risk Factors:Diabetes, Hypertension and                 HLD.  Sonographer:    Clearence Pedammie Crouch RCS Referring Phys: [id number] Ronnald RampWESLEY THOMAS O'NEAL IMPRESSIONS  1. Left ventricular ejection fraction, by estimation, is 50 to 55%. The left ventricle has low normal function. The left ventricle has no regional wall motion abnormalities. There is mild left  ventricular hypertrophy. Left ventricular diastolic parameters are indeterminate.  2. Right ventricular systolic function is mildly reduced. The right ventricular size is mildly enlarged. There is moderately elevated pulmonary artery systolic pressure. The estimated right ventricular systolic pressure is 48.4 mmHg.  3. Left atrial size was severely dilated.  4. Right atrial size was mildly dilated.  5. A small pericardial effusion is present.  6. The mitral valve is normal in structure. Mild to moderate mitral valve regurgitation. No evidence of mitral stenosis.  7. The aortic valve is calcified. There is severe calcification of the aortic valve. Aortic valve regurgitation is trivial. Moderate to severe aortic valve stenosis. Vmax 3.0 m/s, MG 20mmHg, AVA 0.8cm^2, DI 0.29  8. Aortic dilatation noted. There is mild dilatation  of the ascending aorta, measuring 38 mm.  9. The inferior vena cava is dilated in size with >50% respiratory variability, suggesting right atrial pressure of 8 mmHg. FINDINGS  Left Ventricle: Left ventricular ejection fraction, by estimation, is 50 to 55%. The left ventricle has low normal function. The left ventricle has no regional wall motion abnormalities. The left ventricular internal cavity size was normal in size. There is mild left ventricular hypertrophy. Left ventricular diastolic parameters are indeterminate. Right Ventricle: The right ventricular size is mildly enlarged. No increase in right ventricular wall thickness. Right ventricular systolic function is mildly reduced. There is moderately elevated pulmonary artery systolic pressure. The tricuspid regurgitant velocity is 3.18 m/s, and with an assumed right atrial pressure of 8 mmHg, the estimated right ventricular systolic pressure is 48.4 mmHg. Left Atrium: Left atrial size was severely dilated. Right Atrium: Right atrial size was mildly dilated. Pericardium: A small pericardial effusion is present. Mitral Valve: The mitral valve  is normal in structure. Mild to moderate mitral valve regurgitation. No evidence of mitral valve stenosis. Tricuspid Valve: The tricuspid valve is normal in structure. Tricuspid valve regurgitation is trivial. Aortic Valve: The aortic valve is calcified. There is severe calcifcation of the aortic valve. Aortic valve regurgitation is trivial. Aortic regurgitation PHT measures 674 msec. Moderate to severe aortic stenosis is present. Aortic valve mean gradient measures 20.0 mmHg. Aortic valve peak gradient measures 37.9 mmHg. Aortic valve area, by VTI measures 0.87 cm. Pulmonic Valve: The pulmonic valve was grossly normal. Pulmonic valve regurgitation is mild. Aorta: Aortic dilatation noted. There is mild dilatation of the ascending aorta, measuring 38 mm. Venous: The inferior vena cava is dilated in size with greater than 50% respiratory variability, suggesting right atrial pressure of 8 mmHg. IAS/Shunts: The interatrial septum was not well visualized.  LEFT VENTRICLE PLAX 2D LVIDd:         6.10 cm   Diastology LVIDs:         4.30 cm   LV e' medial:    6.58 cm/s LV PW:         1.60 cm   LV E/e' medial:  20.1 LV IVS:        1.00 cm   LV e' lateral:   11.60 cm/s LVOT diam:     2.00 cm   LV E/e' lateral: 11.4 LV SV:         62 LV SV Index:   30 LVOT Area:     3.14 cm  RIGHT VENTRICLE RV Basal diam:  4.10 cm RV Mid diam:    3.60 cm RV S prime:     7.03 cm/s TAPSE (M-mode): 1.1 cm RVSP:           48.4 mmHg LEFT ATRIUM              Index        RIGHT ATRIUM           Index LA diam:        6.50 cm  3.15 cm/m   RA Pressure: 8.00 mmHg LA Vol (A2C):   104.0 ml 50.34 ml/m  RA Area:     22.60 cm LA Vol (A4C):   101.0 ml 48.89 ml/m  RA Volume:   61.00 ml  29.53 ml/m LA Biplane Vol: 104.0 ml 50.34 ml/m  AORTIC VALVE AV Area (Vmax):    0.98 cm AV Area (Vmean):   0.97 cm AV Area (VTI):     0.87 cm AV Vmax:  308.00 cm/s AV Vmean:          207.000 cm/s AV VTI:            0.717 m AV Peak Grad:      37.9 mmHg AV Mean  Grad:      20.0 mmHg LVOT Vmax:         96.27 cm/s LVOT Vmean:        63.600 cm/s LVOT VTI:          0.198 m LVOT/AV VTI ratio: 0.28 AI PHT:            674 msec  AORTA Ao Root diam: 3.70 cm Ao Asc diam:  3.80 cm MITRAL VALVE                  TRICUSPID VALVE MV Area (PHT):                TR Peak grad:   40.4 mmHg MV Decel Time:                TR Vmax:        318.00 cm/s MR Peak grad:    129.0 mmHg   Estimated RAP:  8.00 mmHg MR Mean grad:    98.0 mmHg    RVSP:           48.4 mmHg MR Vmax:         568.00 cm/s MR Vmean:        481.0 cm/s   SHUNTS MR PISA:         4.02 cm     Systemic VTI:  0.20 m MR PISA Eff ROA: 22 mm       Systemic Diam: 2.00 cm MR PISA Radius:  0.80 cm MV E velocity: 132.00 cm/s Epifanio Lesches MD Electronically signed by Epifanio Lesches MD Signature Date/Time: 02/18/2023/2:07:54 PM    Final     MDS (myelodysplastic syndrome) (HCC) #Low-grade myelodysplastic syndrome-refractory anemia with ringed sideroblasts.POSITIVE for SF3B1.  Patient currently on Retacrit [since April 2021]; hemoglobin between 8-9.  Patient is fairly asymptomatic. Stable. Continue Retacrit every 2 weeks.  # Discussed with the patient that if he gets more fatigue/symptomatic the options include Luspatercept/Revlimid.  #Today hemoglobin is 9.5- stable Continue Aranesp for now.  OCT 2023- B12 790; Ferritin-826  [recently started Fe/B12]  Okay to come off of oral iron.  Unlikely Aranesp to affect his bilirubin.   # HTN- at home in 140s; BP- 160s- monitor closely. stable  # Intracranial bleed while on Eliquis/Hx of stroke s/p evacuation- [at home > 2 months]- OFF ELIQUIS; on asprin.  stable  # Hx Of A.fib [OFF eliquis secondary to intracranial bleed]- s/p watchman device- on asprin ONLY- stable  # Stage kidney disease-stage III [dr.Korrapati]-stable  # Elevated bilirubin- [JAN 2024] liver suggesting underlying cirrhosis. No signs of biliary tract obstruction vs-gallbladder sludge.? Enzyme deficient-  triggered by stress/UTI.  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 Repeat abdominal MRI with and without IV gadolinium in JAN 2025.   # Numerous tiny T2 hyperintensities scattered throughout the Spleen- benign lesions  small cavernous hemangiomas or lymphangioma-   # DISPOSITION:  # aranesp today # in 2 week- lab- H&H; possible aranesp # in 4 weeks- lab- H&H; possible aranesp # in 6 weeks- lab- H&H; possible aranesp # in 8 weeks- lab- H&H; possible aranesp # in 10  weeks- lab- H&H; possible aranesp # # in 12  weeks- MD; labs-cbc/bmp/ aranesp;;iron studies/ferritin; B12 levels-Dr.B  All questions were answered. The patient knows to call the clinic with any problems, questions or concerns.    Earna Coder, MD 03/06/2023 11:13 AM

## 2023-03-06 NOTE — Progress Notes (Signed)
No complaints.  Wife wants to know if this treatment can affect his Bilirubin?  Please discuss with patient.

## 2023-03-11 ENCOUNTER — Ambulatory Visit: Payer: Medicare Other

## 2023-03-11 DIAGNOSIS — R262 Difficulty in walking, not elsewhere classified: Secondary | ICD-10-CM

## 2023-03-11 DIAGNOSIS — I69254 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: Secondary | ICD-10-CM

## 2023-03-11 DIAGNOSIS — R278 Other lack of coordination: Secondary | ICD-10-CM

## 2023-03-11 DIAGNOSIS — M6281 Muscle weakness (generalized): Secondary | ICD-10-CM

## 2023-03-11 DIAGNOSIS — R2689 Other abnormalities of gait and mobility: Secondary | ICD-10-CM

## 2023-03-11 DIAGNOSIS — R2681 Unsteadiness on feet: Secondary | ICD-10-CM

## 2023-03-11 NOTE — Therapy (Signed)
OUTPATIENT PHYSICAL THERAPY TREATMENT  Patient Name: Vernon [name] MRN: [id number] DOB:1945/12/13, 77 y.o., male Today's Date: 03/11/2023   PCP:  Gracelyn Nurse, MD   REFERRING PROVIDER:  Gracelyn Nurse, MD    END OF SESSION:  PT End of Session - 03/11/23 1353     Visit Number 15    Number of Visits 25    Date for PT Re-Evaluation 04/03/23    Authorization Type [employer]    Progress Note Due on Visit 20    PT Start Time 1347    PT Stop Time 1428    PT Time Calculation (min) 41 min    Equipment Utilized During Treatment Gait belt    Activity Tolerance Patient tolerated treatment well    Behavior During Therapy WFL for tasks assessed/performed                Past Medical History:  Diagnosis Date   Anemia    Aortic stenosis    Arthritis    Complication of anesthesia    hard time getting bp up after knee replacement   Coronary artery disease    Diabetes mellitus without complication    GERD (gastroesophageal reflux disease)    occ tums prn   History of hiatal hernia    Hypertension    MDS (myelodysplastic syndrome)    Presence of Watchman left atrial appendage closure device 07/26/2022   27mm Watchman FLX with Dr. Lalla Brothers   Past Surgical History:  Procedure Laterality Date   CARPAL TUNNEL RELEASE  2012   CRANIOTOMY N/A 02/10/2022   Procedure: SUBOCCIPITAL CRANIECTOMY FOR EVACUATION OF CEREBELLAR HEMATOMA;  Surgeon: Barnett Abu, MD;  Location: [facility] OR;  Service: Neurosurgery;  Laterality: N/A;   JOINT REPLACEMENT Right 2010   LEFT ATRIAL APPENDAGE OCCLUSION N/A 07/26/2022   Procedure: LEFT ATRIAL APPENDAGE OCCLUSION;  Surgeon: Lanier Prude, MD;  Location: [facility] INVASIVE CV [facility];  Service: Cardiovascular;  Laterality: N/A;   SHOULDER ARTHROSCOPY WITH ROTATOR CUFF REPAIR AND OPEN BICEPS TENODESIS Right 11/09/2019   Procedure: RIGHT SHOULDER ARTHROSCOPY WITH SUBSCAPULARIS REPAIR, SUBACROMIAL DECOMPRESSION,MINI OPEN ROTATOR CUFF REPAIR;  Surgeon: Signa Kell, MD;  Location: [facility] ORS;  Service: Orthopedics;  Laterality: Right;   TEE WITHOUT CARDIOVERSION N/A 07/26/2022   Procedure: TRANSESOPHAGEAL ECHOCARDIOGRAM (TEE);  Surgeon: Lanier Prude, MD;  [facility]: [facility] INVASIVE CV LAB;  Service: Cardiovascular;  Laterality: N/A;   Patient Active Problem List   Diagnosis Date Noted   Urinary tract infection without hematuria 12/19/2022   Painless jaundice 12/18/2022   Elevated LFTs 12/18/2022   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   Obstructive jaundice 12/17/2022   Atrial fibrillation 07/26/2022   Presence of Watchman left atrial appendage closure device 07/26/2022   Proteinuria, unspecified 05/23/2022   Simple renal cyst 05/23/2022   Long term current use of anticoagulant 04/04/2022   Rotator cuff syndrome 04/04/2022   Exposure to potentially hazardous substance 04/04/2022   Gout 04/04/2022   Osteoarthritis 04/04/2022   Osteoporosis 04/04/2022   Other specified diseases of hair and hair follicles 04/04/2022   Pain in joint, lower leg 04/04/2022   Problem related to unspecified psychosocial circumstances 04/04/2022   General medical examination for administrative purposes 04/04/2022   Stage 3b chronic kidney disease    Dysphagia, post-stroke    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 hemorrhage of brain 02/26/2022   Cough 02/20/2022   Acute metabolic encephalopathy 02/20/2022   Obstructive hydrocephalus 02/19/2022   Dyslipidemia 02/19/2022   Dysphagia 02/19/2022   AKI (acute kidney injury)  Anemia    Pleural effusion on right    Respiratory failure requiring intubation    Cerebral edema    Chronic atrial fibrillation    Controlled type 2 diabetes mellitus with hyperglycemia, without long-term current use of insulin    ICH (intracerebral hemorrhage) 02/10/2022   Intracranial hemorrhage    Anticoagulated    Moderate tricuspid regurgitation 07/26/2021   Moderate aortic valve stenosis 03/08/2020   MDS (myelodysplastic syndrome) 02/18/2020   Macrocytic  anemia 01/13/2020   Spleen enlargement 01/13/2020   Bilateral carotid artery stenosis 07/14/2018   Atrial fibrillation, chronic 07/14/2018   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   Essential hypertension 08/16/2014   Hyperlipemia, mixed 08/16/2014   Renal insufficiency 08/16/2014    ONSET DATE: 02/10/2022  REFERRING DIAG:  I63.9 (ICD-10-CM) - Cerebral infarction, unspecified  I63.9 (ICD-10-CM) - CVA (cerebral vascular accident) (HCC)    THERAPY DIAG:  Muscle weakness (generalized)  Other lack of coordination  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 Pt hospitalized 1/22-1/25/24 for obstructive jaundice. Pt reports it was found he had a UTI with recent hospital stay. He wants to improve his endurance and balance. PMH significant for MDS, chronic a-fib, HTN, DMII, Presence of Watchman Left atrial appendage closure device, anemia, arthritis, hx of hiatal hernia, hx craniotomy 02/10/22, L atrial appendage occlusion 07/26/22, TEE without cardioversion 07/26/2022.     PAIN:  Are you having pain? Yes, 2/10 Left knee resting, worse with standing, walking, terminal extension  PRECAUTIONS: Fall  WEIGHT BEARING RESTRICTIONS: No  FALLS: Has patient fallen in last 6 months? No  PATIENT GOALS: improve  coordination, endurance and balance  OBJECTIVE:    INTERVENTION THIS DATE:     NMR:   Tandem walking in // bars with minimal UE support as needed- down and back- Retro steps on way back x 3 Dynamic reaching (tap sticky note) overhead  - x 15- Very unsteady on left (CGA to min A)  SLS- multiple attempts each LE-  at best 4 sec either leg yet majority between 2-3 with mostly ant or lateral LOB Ambulation with 4WW (focusing on left UE control of walker) x 160 feet  (several bouts of regrouping as left UE would intermittently jerk walker to left)   Therex:  Standing in // bars- forward high knee march down and then retro steps back to start x3 Side step (counting steps) down to right then back (4-5 steps each way)  Sit to stand x 10 reps (patient obvious fatigue) x 2 sets  Standing Donkey Kick alt LE x 15 reps   PATIENT EDUCATION: Education details: exam findings, plan, testing Person educated: Patient and Spouse Education method: Explanation Education comprehension: verbalized understanding  HOME EXERCISE PROGRAM: To be initiated next 1-2 visits   GOALS: Goals reviewed with patient? Yes  SHORT TERM GOALS: Target date: 02/20/2023  Patient will be independent in home exercise program to improve strength/mobility for better functional independence with ADLs. Baseline: to be intiated; 02/13/2023= Patient reports no issues with current HEP.  Goal status: MET   LONG TERM GOALS: Target date: 04/03/2023   Patient will complete five times sit to stand test in < 10 seconds indicating an increased LE strength and improved balance. Baseline: 14.89 sec; 02/13/2023= 11.99 without UE Support Goal status: Progressing  2.  Patient will increase 10 meter walk test to >1.19m/s as to improve gait speed for better community ambulation and to reduce fall risk. Baseline:  0.82 m/s; 02/13/2023= 1.03 m/s with RW Goal status: MET  3.  Patient will increase Berg Balance score by > 6 points to  demonstrate decreased fall risk during functional activities. Baseline: 39/56; 02/13/2023=41/56 Goal status: Progressing  4.  Patient will increase six minute walk test distance to >1000 for progression to community ambulator and improve gait ability Baseline: 795'; 02/13/2023= 890 feet with RW and 2 brief standing rest breaks Goal status: Progressing  5.  Patient will demonstrate improved function as evidenced by a score of 62 on FOTO measure for full participation in activities at home and in the community. Baseline: 59; 02/13/2023=62 Goal status: Progressing  ASSESSMENT:  CLINICAL IMPRESSION: Patient arrived with good motivation for today's session. He continues to demo hemiballistic motor pattern with sporatic and involuntary movement to left and unable to control. He worked hard on balance and coordinating movement with LE strengthening in standing position today.  Pt will continue to benefit from skilled PT services to address deficits and impairment identified in evaluation in order to maximize independence and safety in basic mobility required for performance of ADL, IADL, and leisure.     OBJECTIVE IMPAIRMENTS: Abnormal gait, decreased activity tolerance, decreased balance, decreased coordination, decreased endurance, decreased knowledge of use of DME, decreased mobility, difficulty walking, decreased strength, impaired UE functional use, improper body mechanics, postural dysfunction, and pain.   ACTIVITY LIMITATIONS: carrying, lifting, bending, standing, squatting, stairs, transfers, and locomotion level  PARTICIPATION LIMITATIONS: meal prep, cleaning, laundry, driving, shopping, community activity, and yard work  PERSONAL FACTORS: Age, Past/current experiences, Time since onset of injury/illness/exacerbation, and 3+ comorbidities: PMH significant for MDS, chronic a-fib, HTN, DMII, Presence of Watchman Left atrial appendage closure device, anemia, arthritis, hx of hiatal hernia, hx  craniotomy 02/10/22, L atrial appendage occlusion 07/26/22, TEE without cardioversion 07/26/2022  are also affecting patient's functional outcome.   REHAB POTENTIAL: Good  CLINICAL DECISION MAKING: Evolving/moderate complexity  EVALUATION COMPLEXITY: Moderate  PLAN:  PT FREQUENCY: 2x/week  PT DURATION: 12 weeks  PLANNED INTERVENTIONS: Therapeutic exercises, Therapeutic activity,  Neuromuscular re-education, Balance training, Gait training, Patient/Family education, Self Care, Joint mobilization, Stair training, Vestibular training, Canalith repositioning, Visual/preceptual remediation/compensation, Orthotic/Fit training, DME instructions, Electrical stimulation, Wheelchair mobility training, Spinal mobilization, Cryotherapy, Moist heat, Splintting, Taping, Ultrasound, Manual therapy, and Re-evaluation  PLAN FOR NEXT SESSION:  Continue with strengthening exercises and balance related tasks as tolerated given his left knee DJD limitations  Louis Meckel, PT Physical Therapist - [facility]  3:09 PM 03/11/23    03/11/23, 3:09 PM

## 2023-03-12 NOTE — Therapy (Signed)
OUTPATIENT OCCUPATIONAL THERAPY NEURO TREATMENT NOTE   Patient Name: Vernon [name] MRN: [id number] DOB:[birth year], 77 y.o., male Today's Date: 03/12/2023  REFERRING PROVIDER:  Marcelino Duster, MD  END OF SESSION:  OT End of Session - 03/12/23 0821     Visit Number 16    Number of Visits 24    Date for OT Re-Evaluation 04/01/23    Authorization Time Period Progress reporting period starting 02/06/23    Progress Note Due on Visit 10    OT Start Time 1430    OT Stop Time 1515    OT Time Calculation (min) 45 min    Equipment Utilized During Treatment transport chair    Activity Tolerance Patient tolerated treatment well    Behavior During Therapy WFL for tasks assessed/performed             Past Medical History:  Diagnosis Date   Anemia    Aortic stenosis    Arthritis    Complication of anesthesia    hard time getting bp up after knee replacement   Coronary artery disease    Diabetes mellitus without complication    GERD (gastroesophageal reflux disease)    occ tums prn   History of hiatal hernia    Hypertension    MDS (myelodysplastic syndrome)    Presence of Watchman left atrial appendage closure device 07/26/2022   27mm Watchman FLX with Dr. Lalla Brothers   Past Surgical History:  Procedure Laterality Date   CARPAL TUNNEL RELEASE  2012   CRANIOTOMY N/A 02/10/2022   Procedure: SUBOCCIPITAL CRANIECTOMY FOR EVACUATION OF CEREBELLAR HEMATOMA;  Surgeon: Barnett Abu, MD;  Location: [facility] OR;  Service: Neurosurgery;  Laterality: N/A;   JOINT REPLACEMENT Right 2010   LEFT ATRIAL APPENDAGE OCCLUSION N/A 07/26/2022   Procedure: LEFT ATRIAL APPENDAGE OCCLUSION;  Surgeon: Lanier Prude, MD;  Location: [facility] INVASIVE CV LAB;  Service: Cardiovascular;  Laterality: N/A;   SHOULDER ARTHROSCOPY WITH ROTATOR CUFF REPAIR AND OPEN BICEPS TENODESIS Right 11/09/2019   Procedure: RIGHT SHOULDER ARTHROSCOPY WITH SUBSCAPULARIS REPAIR, SUBACROMIAL DECOMPRESSION,MINI OPEN ROTATOR CUFF REPAIR;   Surgeon: Signa Kell, MD;  Location: [facility] ORS;  Service: Orthopedics;  Laterality: Right;   TEE WITHOUT CARDIOVERSION N/A 07/26/2022   Procedure: TRANSESOPHAGEAL ECHOCARDIOGRAM (TEE);  Surgeon: Lanier Prude, MD;  Location: [facility] INVASIVE CV LAB;  Service: Cardiovascular;  Laterality: N/A;   Patient Active Problem List   Diagnosis Date Noted   Urinary tract infection without hematuria 12/19/2022   Painless jaundice 12/18/2022   Elevated LFTs 12/18/2022   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   Obstructive jaundice 12/17/2022   Atrial fibrillation 07/26/2022   Presence of Watchman left atrial appendage closure device 07/26/2022   Proteinuria, unspecified 05/23/2022   Simple renal cyst 05/23/2022   Long term current use of anticoagulant 04/04/2022   Rotator cuff syndrome 04/04/2022   Exposure to potentially hazardous substance 04/04/2022   Gout 04/04/2022   Osteoarthritis 04/04/2022   Osteoporosis 04/04/2022   Other specified diseases of hair and hair follicles 04/04/2022   Pain in joint, lower leg 04/04/2022   Problem related to unspecified psychosocial circumstances 04/04/2022   General medical examination for administrative purposes 04/04/2022   Stage 3b chronic kidney disease    Dysphagia, post-stroke    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 hemorrhage of brain 02/26/2022   Cough 02/20/2022   Acute metabolic encephalopathy 02/20/2022   Obstructive hydrocephalus 02/19/2022   Dyslipidemia 02/19/2022   Dysphagia 02/19/2022   AKI (acute kidney injury)    Anemia    Pleural effusion on  right    Respiratory failure requiring intubation    Cerebral edema    Chronic atrial fibrillation    Controlled type 2 diabetes mellitus with hyperglycemia, without long-term current use of insulin    ICH (intracerebral hemorrhage) 02/10/2022   Intracranial hemorrhage    Anticoagulated    Moderate tricuspid regurgitation 07/26/2021   Moderate aortic valve stenosis 03/08/2020   MDS (myelodysplastic syndrome)  02/18/2020   Macrocytic anemia 01/13/2020   Spleen enlargement 01/13/2020   Bilateral carotid artery stenosis 07/14/2018   Atrial fibrillation, chronic 07/14/2018   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   Essential hypertension 08/16/2014   Hyperlipemia, mixed 08/16/2014   Renal insufficiency 08/16/2014    ONSET DATE: 02/10/2022  REFERRING DIAG:  CVA  THERAPY DIAG:  Coordination Muscle weakness (generalized)  Other lack of coordination  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  Pt states it will be more manageable with their schedule at home as spouse just had hip sx last week.   Pt accompanied by: significant other  PERTINENT HISTORY: Pt. is a 77 y. 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 Pt. received outpt. OT services following the CVA for Left sided weakness, and incoordination. Pt. was recently hospitalized from 1/22-1/25/24 for Obstructive Jaundice. PMHx includes: MDS, Chronic A-Fib, Essential HTN, Type II DM, Dyslipidemia, Presence of Watchman Left atrial appendage closure device, GERD  PRECAUTIONS: None  WEIGHT BEARING RESTRICTIONS: No  PAIN: 8/10 pain in L IF when PIP joint triggers Are you having pain? Denies pain during session   FALLS: Has patient fallen in last 6 months? No  LIVING ENVIRONMENT: Lives with: lives with their spouse Lives in: House/apartment one storey, can fully live on the 1st floor. Stairs: Ramped entrance  Has following equipment at home: 4 wheeled walker, w/c-manual, grab bars, shower chair, HH shower head, toilet riser with arms.  PLOF: Independent  PATIENT GOALS: To improve arm and hand function   OBJECTIVE:   HAND DOMINANCE:  Right  ADLs: Transfers/ambulation related to ADLs: Eating: Difficulty with motor control control Grooming:  Independent UB Dressing:  Diffiulty buttoning when no looking directly at the buttons.  LB Dressing: Independent donning pants, socks, and slide on shoes. Toileting: Independent Bathing: Independent, wife assists with towel. Tub Shower transfers: Modified Independence  in the shower, CGA transfers out of shoer.   IADLs: Shopping:  Pt.'s wife performs, occasionally uses grocery store scooters Light housekeeping: Wife performs housekeeping tasks. Meal Prep: Prepares light snacks, and light meal prep Community mobility: Relies on family and friends Medication management: Independent Financial management: Does  with wife; No changes in how they are doing them Handwriting: 100% legible for name: Wife has noticed some changes with writing  Hobbies: Plays the guitar, workshop, music shop.  MOBILITY STATUS: Needs Assist: uses a rollator  POSTURE COMMENTS:  No Significant postural limitations Sitting balance: WFL  ACTIVITY TOLERANCE: Activity tolerance:  Limited  FUNCTIONAL OUTCOME MEASURES:  FOTO: 59; TR score: 60 02/06/23: 61  UPPER EXTREMITY ROM:    Active ROM Right Eval  Left Eval [facility]  Shoulder flexion 106   Shoulder abduction 85   Shoulder adduction    Shoulder extension    Shoulder internal rotation    Shoulder external rotation    Elbow flexion [facility]  Elbow extension WFL   Wrist flexion    Wrist extension WFL   Wrist ulnar deviation    Wrist radial deviation    Wrist pronation    Wrist supination WFL   (Blank rows = not tested)  Digit flexion to the [facility]:  2nd: 0cm, 3rd: 1.5cm, 4th: 2.5cm, 5th: 0cm   UPPER EXTREMITY MMT:     MMT Right eval Left eval  Shoulder flexion 3- 4  Shoulder abduction 3- 4  Shoulder adduction    Shoulder extension    Shoulder internal rotation    Shoulder external rotation    Middle trapezius    Lower trapezius     Elbow flexion 4 4  Elbow extension 4 4  Wrist flexion    Wrist extension 4 4  Wrist ulnar deviation    Wrist radial deviation    Wrist pronation    Wrist supination    (Blank rows = not tested)  HAND FUNCTION: Grip strength: N/A secondary to arthritis, and 3rd digit trigger finger    Lateral Pinch: R: 23#, L: 20#  3 Pt. Pinch: R: 22#, L: 21# 02/06/23: Lateral Pinch: R: 22#, L: 20#  3 Pt. Pinch: R: 22#, L: 17#  COORDINATION: 9 Hole Peg test: Right: 25 sec; Left: 2 min. & 34  sec 02/06/23: 9 Hole Peg test: Left: 1 min. & 40 sec  SENSATION: WFL Light touch: WFL Stereognosis: WFL  EDEMA:   N/A  MUSCLE TONE:  Intact  COGNITION: Overall cognitive status: Within functional limits for tasks assessed  VISION: Subjective report: Pt. reports double vision initially at the onset of the CVA with difficulty scanning to left when reading. Pt. Reports this has improved, and the double vision has resolved.   PERCEPTION: WFL  PRAXIS: Impaired motor control, and coordination   TODAY'S TREATMENT:       Neuromuscular re-education: Facilitated LUE GMC/FMC/dexterity skills, working to place jumbo pegs into pegboard.  Pegs were placed upright on table top with pt working to grasp pegs without knocking them down, then worked to place pegs into pegboard hole without touching the neighboring holes.  Pegs were placed at arm's reach on table top to facilitate forward and lateral targeted reaching patterns.  Pt practiced removing 1 peg at a time from pegboard, storing up to 4 in hand, and discarding pegs 1 at a time from palm to promote translatory skills.  Pt practiced rotating pegs 180 and repositioning pegs between fingertips without dropping.  Practiced quick alternating finger positions on a foam dowel to simulate various cords on guitar.  Therapeutic Exercise: Facilitated L forearm, wrist, and hand strengthening with participation in EZ board tools.  Pt worked with long handled tool to  facilitate L wrist flex/ext and forearm pron/sup, small and large base key turn, and small and large dial turn x3 reps for each tool (up/down board=1 rep).  Rest breaks between sets and intermittent min A to maintain tools level on velcro resistance strip.  PATIENT EDUCATION: Education details:  Person educated: vc, Chemical engineer: explanation, Manufacturing engineer, vc Education comprehension: verbalized/demonstrated understanding, further training needed  Home Exercise Program:  Engaging the LUE into daily tasks, use of LUE for reaching/grasping ADL supplies  GOALS: Goals reviewed with patient?  SHORT TERM GOALS: Target date: 02/18/2023   Pt. will demonstrate independence with HEP for LUE functioning Baseline: Eval: No current HEP; 02/06/23: Pt consistently engaging the LUE into grasping and reaching for ADL supplies that can not be damaged if dropped Goal status: ongoing  LONG TERM GOALS: Target date:  04/01/2023  Pt. will improve FOTO score by 1 point for patient perceived improvement with assessment specific ADLs, and IADLs.  Baseline: Eval: FOTO score: 59; TR score: 60; 02/06/23: 61 Goal status: achieved/ongoing  2.  Pt. will independently modulate the accuracy of holding items without overgripping when reaching for items 50% of the time Baseline: Eval: Pt. is consistently overpowering, and over gripping items when grasping them with the left hand; 02/06/23: pt reports that he can tell he's improved with ability to press his bass strings without too much force.  Continued difficulty using correct force to grasp a cup. Goal status: ongoing  3.  Pt. will improve left hand [facility] skills (Revised: 9 hole in < 1 min 15 sec) (initial: by 10 sec; achieved) to improve left hand grasp on objects.  Baseline: Eval: R: 25 sec. L: 2 min. & 34 sec; 02/06/23: L 1 min 40 sec Goal status: revised  4.  Pt. Will utilize compensatory strategies for left hand function motor coordination 75% during ADLs, and IADL  tasks. Baseline: Eval: Minimal use of compensatory strategies during ADLs, and IADLs; 02/06/23: Pt more consistently bearing weight into L forearm when performing FMC tasks to increase distal control. Goal status: ongoing  5.  Pt. Will use compensatory strategies for left hand function 75% of the time when utilizing multiple body systems, dual tasking, and using the hand in a variety of different contexts. Baseline: Eval: Pt. Consistently has difficulty using his left hand to complete tasks when he is not looking directly at his hand, and has more difficulty using his left hand when standing, or attempting to walk with the walker; 02/06/23: Pt more consistently bearing weight into L forearm when performing [facility] tasks to increase distal control, occasionally will stabilize L wrist with R hand, and initiates rest breaks when LUE is fatigued.  Goal status: ongoing ASSESSMENT:   CLINICAL IMPRESSION: Pt reports he's planning to decrease to 1x per week for OT.  Pt states it will be more manageable with their schedule at home as spouse just had hip sx last week. Pt continues to note some triggering of left middle finger with tasks at times, and ongoing stiffness of same finger.  Pt continues to demonstrate decreased motor control of left UE with attempts at manipulation of items and with reaching.  Pt requires frequent rest breaks and intermittent vc for deep breathing to promote relaxation and to relax proximally at L shoulder for better control of the LUE.  Continue to work towards goals in plan of care to maximize safety and independence with ADL and IADL tasks at home and in the community.    PERFORMANCE DEFICITS: in functional skills including ADLs, IADLs, coordination, dexterity, ROM, strength, Fine motor control, and Gross motor control, cognitive skills including attention, memory, problem solving, and safety awareness, and psychosocial skills including coping strategies, environmental adaptation, habits,  and routines and behaviors.   IMPAIRMENTS: are limiting patient from ADLs, IADLs, play, leisure, and social participation.   CO-MORBIDITIES: may have co-morbidities  that affects occupational performance. Patient will benefit from skilled OT to address above impairments and improve overall function.  MODIFICATION OR ASSISTANCE TO COMPLETE EVALUATION: Min-Moderate modification of tasks or assist with assess necessary to complete an evaluation.  OT OCCUPATIONAL PROFILE AND HISTORY: Detailed assessment: Review of records and additional review of physical, cognitive, psychosocial history related to current functional performance.  CLINICAL DECISION MAKING: Moderate - several treatment options, min-mod task modification necessary  REHAB POTENTIAL: Good  EVALUATION COMPLEXITY: Moderate   PLAN:  OT FREQUENCY: 2x/week  OT DURATION: 12 weeks  PLANNED INTERVENTIONS: self care/ADL training, therapeutic exercise, therapeutic activity, patient/family education, cognitive remediation/compensation, and DME and/or AE instructions, neuromuscular re-education  RECOMMENDED OTHER SERVICES: OT services.  CONSULTED AND AGREED WITH PLAN OF CARE: Patient and family member/caregiver-wife  PLAN FOR NEXT SESSION: LUE GMC/FMC training  Danelle Earthly, MS, OTR/L  [employer] Dials, OT 03/12/2023, 8:22 AM

## 2023-03-13 ENCOUNTER — Encounter: Payer: Medicare Other | Admitting: Occupational Therapy

## 2023-03-13 ENCOUNTER — Ambulatory Visit: Payer: Medicare Other

## 2023-03-18 ENCOUNTER — Ambulatory Visit: Payer: Medicare Other

## 2023-03-18 DIAGNOSIS — R278 Other lack of coordination: Secondary | ICD-10-CM

## 2023-03-18 DIAGNOSIS — I69254 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: Secondary | ICD-10-CM

## 2023-03-18 DIAGNOSIS — R2681 Unsteadiness on feet: Secondary | ICD-10-CM

## 2023-03-18 DIAGNOSIS — M6281 Muscle weakness (generalized): Secondary | ICD-10-CM

## 2023-03-18 DIAGNOSIS — R2689 Other abnormalities of gait and mobility: Secondary | ICD-10-CM

## 2023-03-18 DIAGNOSIS — R262 Difficulty in walking, not elsewhere classified: Secondary | ICD-10-CM | POA: Diagnosis not present

## 2023-03-18 NOTE — Therapy (Signed)
OUTPATIENT OCCUPATIONAL THERAPY NEURO TREATMENT NOTE   Patient Name: Vernon [name] MRN: [id number] DOB:11-05-1946, 77 y.o., male Today's Date: 03/18/2023  REFERRING PROVIDER:  Marcelino Duster, MD  END OF SESSION:  OT End of Session - 03/18/23 1435     Visit Number 17    Number of Visits 24    Date for OT Re-Evaluation 04/01/23    Authorization Time Period Progress reporting period starting 02/06/23    Progress Note Due on Visit 10    OT Start Time 1430    OT Stop Time 1515    OT Time Calculation (min) 45 min    Equipment Utilized During Treatment transport chair    Activity Tolerance Patient tolerated treatment well    Behavior During Therapy WFL for tasks assessed/performed             Past Medical History:  Diagnosis Date   Anemia    Aortic stenosis    Arthritis    Complication of anesthesia    hard time getting bp up after knee replacement   Coronary artery disease    Diabetes mellitus without complication    GERD (gastroesophageal reflux disease)    occ tums prn   History of hiatal hernia    Hypertension    MDS (myelodysplastic syndrome)    Presence of Watchman left atrial appendage closure device 07/26/2022   27mm Watchman FLX with Dr. Lalla Brothers   Past Surgical History:  Procedure Laterality Date   CARPAL TUNNEL RELEASE  2012   CRANIOTOMY N/A 02/10/2022   Procedure: SUBOCCIPITAL CRANIECTOMY FOR EVACUATION OF CEREBELLAR HEMATOMA;  Surgeon: Barnett Abu, MD;  Location: [facility] OR;  Service: Neurosurgery;  Laterality: N/A;   JOINT REPLACEMENT Right 2010   LEFT ATRIAL APPENDAGE OCCLUSION N/A 07/26/2022   Procedure: LEFT ATRIAL APPENDAGE OCCLUSION;  Surgeon: Lanier Prude, MD;  Location: [facility] INVASIVE CV [facility];  Service: Cardiovascular;  Laterality: N/A;   SHOULDER ARTHROSCOPY WITH ROTATOR CUFF REPAIR AND OPEN BICEPS TENODESIS Right 11/09/2019   Procedure: RIGHT SHOULDER ARTHROSCOPY WITH SUBSCAPULARIS REPAIR, SUBACROMIAL DECOMPRESSION,MINI OPEN ROTATOR CUFF REPAIR;   Surgeon: Signa Kell, MD;  Location: [facility] ORS;  Service: Orthopedics;  Laterality: Right;   TEE WITHOUT CARDIOVERSION N/A 07/26/2022   Procedure: TRANSESOPHAGEAL ECHOCARDIOGRAM (TEE);  Surgeon: Lanier Prude, MD;  Location: [facility] INVASIVE CV LAB;  Service: Cardiovascular;  Laterality: N/A;   Patient Active Problem List   Diagnosis Date Noted   Urinary tract infection without hematuria 12/19/2022   Painless jaundice 12/18/2022   Elevated LFTs 12/18/2022   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   Obstructive jaundice 12/17/2022   Atrial fibrillation 07/26/2022   Presence of Watchman left atrial appendage closure device 07/26/2022   Proteinuria, unspecified 05/23/2022   Simple renal cyst 05/23/2022   Long term current use of anticoagulant 04/04/2022   Rotator cuff syndrome 04/04/2022   Exposure to potentially hazardous substance 04/04/2022   Gout 04/04/2022   Osteoarthritis 04/04/2022   Osteoporosis 04/04/2022   Other specified diseases of hair and hair follicles 04/04/2022   Pain in joint, lower leg 04/04/2022   Problem related to unspecified psychosocial circumstances 04/04/2022   General medical examination for administrative purposes 04/04/2022   Stage 3b chronic kidney disease    Dysphagia, post-stroke    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 hemorrhage of brain 02/26/2022   Cough 02/20/2022   Acute metabolic encephalopathy 02/20/2022   Obstructive hydrocephalus 02/19/2022   Dyslipidemia 02/19/2022   Dysphagia 02/19/2022   AKI (acute kidney injury)    Anemia    Pleural effusion on  right    Respiratory failure requiring intubation    Cerebral edema    Chronic atrial fibrillation    Controlled type 2 diabetes mellitus with hyperglycemia, without long-term current use of insulin    ICH (intracerebral hemorrhage) 02/10/2022   Intracranial hemorrhage    Anticoagulated    Moderate tricuspid regurgitation 07/26/2021   Moderate aortic valve stenosis 03/08/2020   MDS (myelodysplastic syndrome)  02/18/2020   Macrocytic anemia 01/13/2020   Spleen enlargement 01/13/2020   Bilateral carotid artery stenosis 07/14/2018   Atrial fibrillation, chronic 07/14/2018   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   Essential hypertension 08/16/2014   Hyperlipemia, mixed 08/16/2014   Renal insufficiency 08/16/2014    ONSET DATE: 02/10/2022  REFERRING DIAG:  CVA  THERAPY DIAG:  Coordination Muscle weakness (generalized)  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   Pt accompanied by: significant other  PERTINENT HISTORY: Pt. is a 77 y. 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 Pt. received outpt. OT services following the CVA for Left sided weakness, and incoordination. Pt. was recently hospitalized from 1/22-1/25/24 for Obstructive Jaundice. PMHx includes: MDS, Chronic A-Fib, Essential HTN, Type II DM, Dyslipidemia, Presence of Watchman Left atrial appendage closure device, GERD  PRECAUTIONS: None  WEIGHT BEARING RESTRICTIONS: No  PAIN: 8/10 pain in L IF when PIP joint triggers Are you having pain? Denies pain during session   FALLS: Has patient fallen in last 6 months? No  LIVING ENVIRONMENT: Lives with: lives with their spouse Lives in: House/apartment one storey, can fully live on the 1st floor. Stairs: Ramped entrance  Has following equipment at home: 4 wheeled walker, w/c-manual, grab bars, shower chair, HH shower head, toilet riser with arms.  PLOF: Independent  PATIENT GOALS: To improve arm and hand function   OBJECTIVE:   HAND DOMINANCE: Right  ADLs: Transfers/ambulation related to ADLs: Eating: Difficulty with motor control control Grooming:   Independent UB Dressing:  Diffiulty buttoning when no looking directly at the buttons.  LB Dressing: Independent donning pants, socks, and slide on shoes. Toileting: Independent Bathing: Independent, wife assists with towel. Tub Shower transfers: Modified Independence  in the shower, CGA transfers out of shoer.   IADLs: Shopping:  Pt.'s wife performs, occasionally uses grocery store scooters Light housekeeping: Wife performs housekeeping tasks. Meal Prep: Prepares light snacks, and light meal prep Community mobility: Relies on family and friends Medication management: Independent Financial management: Does  with wife; No changes in how they are doing them Handwriting: 100% legible for name: Wife has noticed some changes with writing  Hobbies: Plays the guitar, workshop, music shop.  MOBILITY STATUS: Needs Assist: uses a rollator  POSTURE COMMENTS:  No Significant postural limitations Sitting balance: WFL  ACTIVITY TOLERANCE: Activity tolerance:  Limited  FUNCTIONAL OUTCOME MEASURES:  FOTO: 59; TR score: 60 02/06/23: 61  UPPER EXTREMITY ROM:    Active ROM Right Eval  Left Eval [facility]  Shoulder flexion 106   Shoulder abduction 85   Shoulder adduction    Shoulder extension    Shoulder internal rotation    Shoulder external rotation    Elbow flexion WFL   Elbow extension WFL   Wrist flexion    Wrist extension WFL   Wrist ulnar deviation    Wrist radial  deviation    Wrist pronation    Wrist supination WFL   (Blank rows = not tested)  Digit flexion to the [facility]:  2nd: 0cm, 3rd: 1.5cm, 4th: 2.5cm, 5th: 0cm   UPPER EXTREMITY MMT:     MMT Right eval Left eval  Shoulder flexion 3- 4  Shoulder abduction 3- 4  Shoulder adduction    Shoulder extension    Shoulder internal rotation    Shoulder external rotation    Middle trapezius    Lower trapezius    Elbow flexion 4 4  Elbow extension 4 4  Wrist flexion    Wrist extension 4 4  Wrist ulnar deviation    Wrist  radial deviation    Wrist pronation    Wrist supination    (Blank rows = not tested)  HAND FUNCTION: Grip strength: N/A secondary to arthritis, and 3rd digit trigger finger    Lateral Pinch: R: 23#, L: 20#  3 Pt. Pinch: R: 22#, L: 21# 02/06/23: Lateral Pinch: R: 22#, L: 20#  3 Pt. Pinch: R: 22#, L: 17#  COORDINATION: 9 Hole Peg test: Right: 25 sec; Left: 2 min. & 34  sec 02/06/23: 9 Hole Peg test: Left: 1 min. & 40 sec  SENSATION: WFL Light touch: WFL Stereognosis: WFL  EDEMA:   N/A  MUSCLE TONE:  Intact  COGNITION: Overall cognitive status: Within functional limits for tasks assessed  VISION: Subjective report: Pt. reports double vision initially at the onset of the CVA with difficulty scanning to left when reading. Pt. Reports this has improved, and the double vision has resolved.   PERCEPTION: WFL  PRAXIS: Impaired motor control, and coordination   TODAY'S TREATMENT:       Neuromuscular re-education: Facilitated LUE GMC/FMC/dexterity skills removing scrabble tiles from container, flipping face up on table, and arranging into 4 letter words. Required rest breaks and Wbing through elbow for tremor management. Pt worked on grasping one inch resistive cubes with 3 point pinch to remove and replace cubes x2 trials. The board was positioned at a vertical angle (table and shoulder height) to remove then horizontal angle to replace. Pt stabilizes hand with use of forearm on table and 5th digit on board for improved precision.    Manual Therapy: Soft tissue massage performed at the base of the L IF MCP volar palm, and PIP joint of same digit, working to increase circulation, decrease pain, decrease swelling related to trigger finger in this digit.   Therapeutic Exercise: Performed passive stretching for L IF for flex and extension of the MCP, PIP, and DIP of this finger.   PATIENT EDUCATION: Education details:  Person educated: vc, Chemical engineer: explanation, Manufacturing engineer,  vc Education comprehension: verbalized/demonstrated understanding, further training needed  Home Exercise Program:  Engaging the LUE into daily tasks, use of LUE for reaching/grasping ADL supplies  GOALS: Goals reviewed with patient?  SHORT TERM GOALS: Target date: 02/18/2023   Pt. will demonstrate independence with HEP for LUE functioning Baseline: Eval: No current HEP; 02/06/23: Pt consistently engaging the LUE into grasping and reaching for ADL supplies that can not be damaged if dropped Goal status: ongoing  LONG TERM GOALS: Target date:  04/01/2023  Pt. will improve FOTO score by 1 point for patient perceived improvement with assessment specific ADLs, and IADLs.  Baseline: Eval: FOTO score: 59; TR score: 60; 02/06/23: 61 Goal status: achieved/ongoing  2.  Pt. will independently modulate the accuracy of holding items without overgripping when reaching for items 50% of  the time Baseline: Eval: Pt. is consistently overpowering, and over gripping items when grasping them with the left hand; 02/06/23: pt reports that he can tell he's improved with ability to press his bass strings without too much force.  Continued difficulty using correct force to grasp a cup. Goal status: ongoing  3.  Pt. will improve left hand [facility] skills (Revised: 9 hole in < 1 min 15 sec) (initial: by 10 sec; achieved) to improve left hand grasp on objects.  Baseline: Eval: R: 25 sec. L: 2 min. & 34 sec; 02/06/23: L 1 min 40 sec Goal status: revised  4.  Pt. Will utilize compensatory strategies for left hand function motor coordination 75% during ADLs, and IADL tasks. Baseline: Eval: Minimal use of compensatory strategies during ADLs, and IADLs; 02/06/23: Pt more consistently bearing weight into L forearm when performing FMC tasks to increase distal control. Goal status: ongoing  5.  Pt. Will use compensatory strategies for left hand function 75% of the time when utilizing multiple body systems, dual tasking, and using  the hand in a variety of different contexts. Baseline: Eval: Pt. Consistently has difficulty using his left hand to complete tasks when he is not looking directly at his hand, and has more difficulty using his left hand when standing, or attempting to walk with the walker; 02/06/23: Pt more consistently bearing weight into L forearm when performing [facility] tasks to increase distal control, occasionally will stabilize L wrist with R hand, and initiates rest breaks when LUE is fatigued.  Goal status: ongoing ASSESSMENT:   CLINICAL IMPRESSION: Pt continues to note some triggering of left middle finger with tasks at times, and ongoing stiffness of same finger.  Improved with hot/cold contrast bath and massage. Tolerated manipulated scrabble tiles and 1 inch resistive cubes with increased difficulty to precisely place tiles/cubes in specified location. Pt requires frequent rest breaks for tremor management, good use of weightbearing through table to manage.  Continue to work towards goals in plan of care to maximize safety and independence with ADL and IADL tasks at home and in the community.    PERFORMANCE DEFICITS: in functional skills including ADLs, IADLs, coordination, dexterity, ROM, strength, Fine motor control, and Gross motor control, cognitive skills including attention, memory, problem solving, and safety awareness, and psychosocial skills including coping strategies, environmental adaptation, habits, and routines and behaviors.   IMPAIRMENTS: are limiting patient from ADLs, IADLs, play, leisure, and social participation.   CO-MORBIDITIES: may have co-morbidities  that affects occupational performance. Patient will benefit from skilled OT to address above impairments and improve overall function.  MODIFICATION OR ASSISTANCE TO COMPLETE EVALUATION: Min-Moderate modification of tasks or assist with assess necessary to complete an evaluation.  OT OCCUPATIONAL PROFILE AND HISTORY: Detailed assessment:  Review of records and additional review of physical, cognitive, psychosocial history related to current functional performance.  CLINICAL DECISION MAKING: Moderate - several treatment options, min-mod task modification necessary  REHAB POTENTIAL: Good  EVALUATION COMPLEXITY: Moderate   PLAN:  OT FREQUENCY: 2x/week  OT DURATION: 12 weeks  PLANNED INTERVENTIONS: self care/ADL training, therapeutic exercise, therapeutic activity, patient/family education, cognitive remediation/compensation, and DME and/or AE instructions, neuromuscular re-education  RECOMMENDED OTHER SERVICES: OT services.  CONSULTED AND AGREED WITH PLAN OF CARE: Patient and family member/caregiver-wife  PLAN FOR NEXT SESSION: LUE GMC/FMC training  Kathie Dike, M.S. OTR/L  03/18/23, 2:39 PM  ascom [phone]   Presley Raddle, OT 03/18/2023, 2:39 PM

## 2023-03-18 NOTE — Therapy (Signed)
OUTPATIENT PHYSICAL THERAPY TREATMENT  Patient Name: Vernon [name] MRN: [id number] DOB:December 14, 1945, 77 y.o., male Today's Date: 03/18/2023   PCP:  Gracelyn Nurse, MD   REFERRING PROVIDER:  Gracelyn Nurse, MD    END OF SESSION:  PT End of Session - 03/18/23 1357     Visit Number 16    Number of Visits 25    Date for PT Re-Evaluation 04/03/23    Authorization Type [employer]    Progress Note Due on Visit 20    PT Start Time 1353    Equipment Utilized During Treatment Gait belt    Activity Tolerance Patient tolerated treatment well    Behavior During Therapy WFL for tasks assessed/performed                Past Medical History:  Diagnosis Date   Anemia    Aortic stenosis    Arthritis    Complication of anesthesia    hard time getting bp up after knee replacement   Coronary artery disease    Diabetes mellitus without complication    GERD (gastroesophageal reflux disease)    occ tums prn   History of hiatal hernia    Hypertension    MDS (myelodysplastic syndrome)    Presence of Watchman left atrial appendage closure device 07/26/2022   27mm Watchman FLX with Dr. Lalla Brothers   Past Surgical History:  Procedure Laterality Date   CARPAL TUNNEL RELEASE  2012   CRANIOTOMY N/A 02/10/2022   Procedure: SUBOCCIPITAL CRANIECTOMY FOR EVACUATION OF CEREBELLAR HEMATOMA;  Surgeon: Barnett Abu, MD;  Location: [facility] OR;  Service: Neurosurgery;  Laterality: N/A;   JOINT REPLACEMENT Right 2010   LEFT ATRIAL APPENDAGE OCCLUSION N/A 07/26/2022   Procedure: LEFT ATRIAL APPENDAGE OCCLUSION;  Surgeon: Lanier Prude, MD;  Location: [facility];  Service: Cardiovascular;  Laterality: N/A;   SHOULDER ARTHROSCOPY WITH ROTATOR CUFF REPAIR AND OPEN BICEPS TENODESIS Right 11/09/2019   Procedure: RIGHT SHOULDER ARTHROSCOPY WITH SUBSCAPULARIS REPAIR, SUBACROMIAL DECOMPRESSION,MINI OPEN ROTATOR CUFF REPAIR;  Surgeon: Signa Kell, MD;  Location: [facility] ORS;  Service: Orthopedics;   Laterality: Right;   TEE WITHOUT CARDIOVERSION N/A 07/26/2022   Procedure: TRANSESOPHAGEAL ECHOCARDIOGRAM (TEE);  Surgeon: Lanier Prude, MD;  Location: [facility];  Service: Cardiovascular;  Laterality: N/A;   Patient Active Problem List   Diagnosis Date Noted   Urinary tract infection without hematuria 12/19/2022   Painless jaundice 12/18/2022   Elevated LFTs 12/18/2022   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   Obstructive jaundice 12/17/2022   Atrial fibrillation 07/26/2022   Presence of Watchman left atrial appendage closure device 07/26/2022   Proteinuria, unspecified 05/23/2022   Simple renal cyst 05/23/2022   Long term current use of anticoagulant 04/04/2022   Rotator cuff syndrome 04/04/2022   Exposure to potentially hazardous substance 04/04/2022   Gout 04/04/2022   Osteoarthritis 04/04/2022   Osteoporosis 04/04/2022   Other specified diseases of hair and hair follicles 04/04/2022   Pain in joint, lower leg 04/04/2022   Problem related to unspecified psychosocial circumstances 04/04/2022   General medical examination for administrative purposes 04/04/2022   Stage 3b chronic kidney disease    Dysphagia, post-stroke    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 hemorrhage of brain 02/26/2022   Cough 02/20/2022   Acute metabolic encephalopathy 02/20/2022   Obstructive hydrocephalus 02/19/2022   Dyslipidemia 02/19/2022   Dysphagia 02/19/2022   AKI (acute kidney injury)    Anemia    Pleural effusion on right    Respiratory failure requiring intubation  Cerebral edema    Chronic atrial fibrillation    Controlled type 2 diabetes mellitus with hyperglycemia, without long-term current use of insulin    ICH (intracerebral hemorrhage) 02/10/2022   Intracranial hemorrhage    Anticoagulated    Moderate tricuspid regurgitation 07/26/2021   Moderate aortic valve stenosis 03/08/2020   MDS (myelodysplastic syndrome) 02/18/2020   Macrocytic anemia 01/13/2020   Spleen enlargement 01/13/2020    Bilateral carotid artery stenosis 07/14/2018   Atrial fibrillation, chronic 07/14/2018   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  Rationale for Evaluation and Treatment: Rehabilitation  SUBJECTIVE:                                                                                                                                                                                             SUBJECTIVE STATEMENT: Patient reports having a good weekend with no falls. States wife has returned to work following her hip surgery.  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 Pt hospitalized 1/22-1/25/24 for obstructive jaundice. Pt reports it was found he had a UTI with recent hospital stay. He wants to improve his endurance and balance. PMH significant for MDS, chronic a-fib, HTN, DMII, Presence of Watchman Left atrial appendage closure device, anemia, arthritis, hx of hiatal hernia, hx craniotomy 02/10/22, L atrial appendage occlusion 07/26/22, TEE without cardioversion 07/26/2022.     PAIN:  Are you having pain? Yes, 2/10 Left knee resting, worse with standing, walking, terminal extension  PRECAUTIONS: Fall  WEIGHT BEARING RESTRICTIONS: No  FALLS: Has patient fallen in last 6 months? No  PATIENT GOALS: improve coordination, endurance and balance  OBJECTIVE:    INTERVENTION THIS DATE:     NMR:   Forward/retro step up/over 2 1/2 foam roller x 10 Side step up and over 1/2 foam roll x 15 reps  Tandem walking in // bars with minimal UE support as needed- down and back x 6  SLS- multiple attempts each LE-  at best 8 sec either leg yet majority between 2-3 with mostly ant or lateral LOB Ambulation with 4WW (focusing on left UE control of walker) x 160 feet  (several bouts of regrouping as left UE would intermittently jerk walker to left)   Therex:  Standing in // bars- forward high  knee march down and then retro steps back to start x3 Gait using upright rollator - 5 min - total over 850 feet- with 3# AW  PATIENT EDUCATION: Education details: exam findings, plan, testing Person educated: Patient and Spouse Education method: Explanation Education comprehension: verbalized understanding  HOME EXERCISE PROGRAM: To be initiated next 1-2 visits   GOALS: Goals reviewed with patient? Yes  SHORT TERM GOALS: Target date: 02/20/2023  Patient will be independent in home exercise program to improve strength/mobility for better functional independence with ADLs. Baseline: to be intiated; 02/13/2023= Patient reports no issues with current HEP.  Goal status: MET   LONG TERM GOALS: Target date: 04/03/2023   Patient will complete five times sit to stand test in < 10 seconds indicating an increased LE strength and improved balance. Baseline: 14.89 sec; 02/13/2023= 11.99 without UE Support Goal status: Progressing  2.  Patient will increase 10 meter walk test to >1.23m/s as to improve gait speed for better community ambulation and to reduce fall risk. Baseline:  0.82 m/s; 02/13/2023= 1.03 m/s with RW Goal status: MET  3.  Patient will increase Berg Balance score by > 6 points to demonstrate decreased fall risk during functional activities. Baseline: 39/56; 02/13/2023=41/56 Goal status: Progressing  4.  Patient will increase six minute walk test distance to >1000 for progression to community ambulator and improve gait ability Baseline: 795'; 02/13/2023= 890 feet with RW and 2 brief standing rest breaks Goal status: Progressing  5.  Patient will demonstrate improved function as evidenced by a score of 62  on FOTO measure for full participation in activities at home and in the community. Baseline: 59; 02/13/2023=62 Goal status: Progressing  ASSESSMENT:  CLINICAL IMPRESSION: Treatment continued to focus on dynamic balance and gait endurance.  He was able to demonstrate improved single legs standing ability. Pt will continue to benefit from skilled PT services to address deficits and impairment identified in evaluation in order to maximize independence and safety in basic mobility required for performance of ADL, IADL, and leisure.     OBJECTIVE IMPAIRMENTS: Abnormal gait, decreased activity tolerance, decreased balance, decreased coordination, decreased endurance, decreased knowledge of use of DME, decreased mobility, difficulty walking, decreased strength, impaired UE functional use, improper body mechanics, postural dysfunction, and pain.   ACTIVITY LIMITATIONS: carrying, lifting, bending, standing, squatting, stairs, transfers, and locomotion level  PARTICIPATION LIMITATIONS: meal prep, cleaning, laundry, driving, shopping, community activity, and yard work  PERSONAL FACTORS: Age, Past/current experiences, Time since onset of injury/illness/exacerbation, and 3+ comorbidities: PMH significant for MDS, chronic a-fib, HTN, DMII, Presence of Watchman Left atrial appendage closure device, anemia, arthritis, hx of hiatal hernia, hx craniotomy 02/10/22, L atrial appendage occlusion 07/26/22, TEE without cardioversion 07/26/2022  are also affecting patient's functional outcome.   REHAB POTENTIAL: Good  CLINICAL DECISION MAKING: Evolving/moderate complexity  EVALUATION COMPLEXITY: Moderate  PLAN:  PT FREQUENCY: 2x/week  PT DURATION: 12 weeks  PLANNED INTERVENTIONS: Therapeutic exercises, Therapeutic activity, Neuromuscular re-education, Balance training, Gait training, Patient/Family education, Self Care, Joint mobilization, Stair training, Vestibular training, Canalith repositioning,  Visual/preceptual remediation/compensation, Orthotic/Fit training, DME instructions, Electrical stimulation, Wheelchair mobility training, Spinal mobilization, Cryotherapy, Moist heat, Splintting, Taping, Ultrasound, Manual therapy, and Re-evaluation  PLAN FOR NEXT SESSION:  Continue with strengthening exercises and balance related tasks as tolerated given his left knee DJD limitations  Louis Meckel, PT Physical Therapist - [facility]  2:48 PM 03/18/23    03/18/23, 2:48 PM

## 2023-03-20 ENCOUNTER — Inpatient Hospital Stay: Payer: Medicare Other

## 2023-03-20 VITALS — BP 131/85

## 2023-03-20 DIAGNOSIS — D469 Myelodysplastic syndrome, unspecified: Secondary | ICD-10-CM

## 2023-03-20 DIAGNOSIS — D461 Refractory anemia with ring sideroblasts: Secondary | ICD-10-CM | POA: Diagnosis not present

## 2023-03-20 LAB — HEMOGLOBIN AND HEMATOCRIT (CANCER CENTER ONLY)
HCT: 29.9 % — ABNORMAL LOW (ref 39.0–52.0)
Hemoglobin: 9.8 g/dL — ABNORMAL LOW (ref 13.0–17.0)

## 2023-03-20 MED ORDER — DARBEPOETIN ALFA 100 MCG/0.5ML IJ SOSY
100.0000 ug | PREFILLED_SYRINGE | Freq: Once | INTRAMUSCULAR | Status: AC
  Administered 2023-03-20: 100 ug via SUBCUTANEOUS
  Filled 2023-03-20: qty 0.5

## 2023-03-24 NOTE — Progress Notes (Unsigned)
Cardiology Office Note:   Date:  03/25/2023  NAME:  Vernon [name]    MRN: [id number] DOB:  [birth year]   PCP:  Gracelyn Nurse, MD  Cardiologist:  None  Electrophysiologist:  Lanier Prude, MD   Referring MD: Gracelyn Nurse, MD   Chief Complaint  Patient presents with   Follow-up        History of Present Illness:   Vernon [name] is a 77 y.o. male with a hx of CAD, permanent Afib, MDS, who presents for follow-up.  He presents with his wife.  Reports he is feeling stable.  No more shortness of breath than normal.  Has chronic kidney disease as well as mild dysplastic syndrome.  Anemia is stable.  Reports no chest pain.  Did suffer a fall at church.  Having some pain in the left chest which is likely musculoskeletal.  We discussed a stress test which shows reduced myocardial blood flow reserve.  Concerns for three-vessel CAD in my opinion.  Also echocardiogram is concerning for severe aortic stenosis.  We discussed left heart catheterization to help clarify the diagnosis.  He is willing to proceed with this.  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 Permanent Afib -s/p Watchman 06/2022 3. CAD -CAC 5415 (97th percentile)  -Cardiac PET 02/26/2023: normal perfusion, MBFR 1.41 4. Moderate to severe aortic stenosis  -MG 20 mmHG, AVA 0.8 cm2, DI 0.29 5. HTN 6. HLD -T chol 187, HDL 25, LDL 136, TG 128 7. MDS 8. Anemia 9. CKD IIIa 10. DM -A1c 5.7 11. HFpEF  Past Medical History: Past Medical History:  Diagnosis Date   Anemia    Aortic stenosis    Arthritis    Complication of anesthesia    hard time getting bp up after knee replacement   Coronary artery disease    Diabetes mellitus without complication (HCC)    GERD (gastroesophageal reflux disease)    occ tums prn   History of hiatal hernia    Hypertension    MDS (myelodysplastic syndrome) (HCC)    Presence of Watchman left atrial appendage closure device 07/26/2022   27mm Watchman FLX with Dr. 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;  Surgeon: Barnett Abu, MD;  Location: [facility] OR;  Service: Neurosurgery;  Laterality: N/A;   JOINT REPLACEMENT Right 2010   LEFT ATRIAL APPENDAGE OCCLUSION N/A 07/26/2022   Procedure: LEFT ATRIAL APPENDAGE OCCLUSION;  Surgeon: Lanier Prude, MD;  Location: [facility] INVASIVE CV LAB;  Service: Cardiovascular;  Laterality: N/A;   SHOULDER ARTHROSCOPY WITH ROTATOR CUFF REPAIR AND OPEN BICEPS TENODESIS Right 11/09/2019   Procedure: RIGHT SHOULDER ARTHROSCOPY WITH SUBSCAPULARIS REPAIR, SUBACROMIAL DECOMPRESSION,MINI OPEN ROTATOR CUFF REPAIR;  Surgeon: Signa Kell, MD;  Location: [facility] ORS;  Service: Orthopedics;  Laterality: Right;   TEE WITHOUT CARDIOVERSION N/A 07/26/2022   Procedure: TRANSESOPHAGEAL ECHOCARDIOGRAM (TEE);  Surgeon: Lanier Prude, MD;  Location: [facility] INVASIVE CV LAB;  Service: Cardiovascular;  Laterality: N/A;    Current Medications: Current Meds  Medication Sig   aspirin EC 81 MG tablet Take 1 tablet (81 mg total) by mouth daily. Swallow whole.   ezetimibe (ZETIA) 10 MG tablet Take 1 tablet (10 mg total) by mouth daily.   furosemide (LASIX) 20 MG tablet Take 20 mg by mouth in the morning and at  bedtime.   metFORMIN (GLUCOPHAGE) 500 MG tablet Take 500 mg by mouth 2 (two) times daily with a meal.   Multiple Vitamin (MULTI-VITAMIN) tablet Take 1 tablet by mouth daily.   ondansetron (ZOFRAN-ODT) 4 MG disintegrating tablet Take 4 mg by mouth every 8 (eight) hours as needed.   polyethylene glycol powder (GLYCOLAX/MIRALAX) 17 GM/SCOOP powder Take 1 Container by mouth daily as needed.   [DISCONTINUED] furosemide (LASIX) 20 MG tablet Take 1 tablet (20 mg total) by mouth daily as needed. (Patient taking differently: Take 20 mg by mouth in the morning and at bedtime.)   Current  Facility-Administered Medications for the 03/25/23 encounter (Office Visit) with Sande Rives, MD  Medication   0.9 %  sodium chloride infusion   0.9 %  sodium chloride infusion     Allergies:    Patient has no known allergies.   Social History: Social History   Socioeconomic History   Marital status: Married    Spouse name: Not on file   Number of children: 2   Years of education: Not on file   Highest education level: Not on file  Occupational History   Occupation: Retired - [employer]  Tobacco Use   Smoking status: Former    Packs/day: 0.50    Years: 8.00    Additional pack years: 0.00    Total pack years: 4.00    Types: Cigarettes    Quit date: 07/21/1978    Years since quitting: 44.7   Smokeless tobacco: Never  Vaping Use   Vaping Use: Never used  Substance and Sexual Activity   Alcohol use: Yes    Alcohol/week: 0.0 - 1.0 standard drinks of alcohol    Comment: rare beer   Drug use: Never   Sexual activity: Not on file  Other Topics Concern   Not on file  Social History Narrative   Lives in [place]; with wife; quit smoking in early 62s; ocassional/ rare [may be 1 a month beer]. 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    Social Determinants of Health   Financial Resource Strain: Not on file  Food Insecurity: No Food Insecurity (12/18/2022)   Hunger Vital Sign    Worried About Running Out of Food in the Last Year: Never true    Ran Out of Food in the Last Year: Never true  Transportation Needs: No Transportation Needs (12/18/2022)   PRAPARE - Administrator, Civil Service (Medical): No    Lack of Transportation (Non-Medical): No  Physical Activity: Not on file  Stress: Not on file  Social Connections: Not on file     Family History: The patient's family history includes Cancer in his maternal uncle.  ROS:   All other ROS reviewed and negative. Pertinent positives noted in the HPI.     EKGs/Labs/Other Studies Reviewed:   The  following studies were personally reviewed by me today:   EKG:  EKG is ordered today.  The ekg ordered today demonstrates Atrial fibrillation heart rate 61, PVCs noted, and was personally reviewed by me.   Recent Labs: 12/19/2022: Magnesium 1.6 01/09/2023: ALT 21 01/14/2023: BNP 341.9 03/06/2023: BUN 23; Creatinine 1.21; Platelet Count 233; Potassium 4.2; Sodium 137 03/20/2023: Hemoglobin 9.8   Recent Lipid Panel    Component Value Date/Time   CHOL 187 12/19/2022 1029   TRIG 128 12/19/2022 1029   HDL 25 (L) 12/19/2022 1029   CHOLHDL 7.5 12/19/2022 1029   VLDL 26 12/19/2022 1029   LDLCALC 136 (H) 12/19/2022  1029    Physical Exam:   VS:  BP 136/70   Pulse (!) 49   Ht 5\' 9"  (1.753 m)   Wt 191 lb (86.6 kg)   SpO2 95%   BMI 28.21 kg/m    Wt Readings from Last 3 Encounters:  03/25/23 191 lb (86.6 kg)  03/06/23 193 lb 11.2 oz (87.9 kg)  01/14/23 200 lb (90.7 kg)    General: Well nourished, well developed, in no acute distress Head: Atraumatic, normal size  Eyes: PEERLA, EOMI  Neck: Supple, no JVD Endocrine: No thryomegaly Cardiac: Normal S1, S2; irregular rhythm, 3 out of 6 systolic ejection murmur, late peaking Lungs: Clear to auscultation bilaterally, no wheezing, rhonchi or rales  Abd: Soft, nontender, no hepatomegaly  Ext: No edema, pulses 2+ Musculoskeletal: No deformities, BUE and BLE strength normal and equal Skin: Warm and dry, no rashes   Neuro: Alert and oriented to person, place, time, and situation, CNII-XII grossly intact, no focal deficits  Psych: Normal mood and affect   ASSESSMENT:   Vernon [name] is a 77 y.o. male who presents for the following: 1. SOB (shortness of breath) on exertion   2. Abnormal nuclear stress test   3. Coronary artery disease involving native coronary artery of native heart without angina pectoris   4. Agatston coronary artery calcium score greater than 400   5. Mixed hyperlipidemia   6. Moderate aortic valve stenosis   7. Permanent  atrial fibrillation (HCC)   8. Acquired thrombophilia (HCC)   9. Chronic diastolic heart failure (HCC)     PLAN:   1. SOB (shortness of breath) on exertion 2. Abnormal nuclear stress test 3. Coronary artery disease involving native coronary artery of native heart without angina pectoris 4. Agatston coronary artery calcium score greater than 400 5. Mixed hyperlipidemia -Severely elevated coronary calcium score.  Cardiac PET shows reduced myocardial blood flow reserve which is concerning for three-vessel CAD.  He has evidence of heart failure preserved ejection fraction.  He has evidence of RV failure.  I have recommended a left and right heart catheterization to clarify the diagnosis as well as measure his heart pressures.  I am also concerned about his aortic valve.  Suspect this is moderate to severe.  Examination more consistent with severe aortic stenosis.  I believe catheterization can help clarify the diagnosis as well.  He did have a CT scan as part of watchman workup which showed a calcium score of the aortic valve above 2000.  This is consistent with severe of stenosis.  Unclear if he will need TAVR versus bypass surgery with SAVR. -He will continue aspirin.  Reports no angina.  We discussed nonstatin medications.  He was told not to take statins by his kidney doctor.  We will pursue Zetia.  We will reach out to his kidney doctor to see if it is okay for him to be on a statin.  6. Moderate aortic valve stenosis -Suspect more consistent with severe arctic stenosis.  Left and right heart catheterization as above.  7. Permanent atrial fibrillation (HCC) -Status post watchman.  Not on anticoagulation.  8. Acquired thrombophilia (HCC) -Status post watchman  9. Chronic diastolic heart failure (HCC) -Evidence of diastolic heart failure.  Plan for left and right heart catheterization.  Concern for three-vessel CAD as above.  Also aortic valve.   Shared Decision Making/Informed Consent The  risks [stroke (1 in 1000), death (1 in 1000), kidney failure [usually temporary] (1 in 500), bleeding (1 in  200), allergic reaction [possibly serious] (1 in 200)], benefits (diagnostic support and management of coronary artery disease) and alternatives of a cardiac catheterization were discussed in detail with Vernon [name] and he is willing to proceed.  Disposition: Return in about 1 month (around 04/24/2023).  Medication Adjustments/Labs and Tests Ordered: Current medicines are reviewed at length with the patient today.  Concerns regarding medicines are outlined above.  Orders Placed This Encounter  Procedures   CBC   Comprehensive metabolic panel   Meds ordered this encounter  Medications   ezetimibe (ZETIA) 10 MG tablet    Sig: Take 1 tablet (10 mg total) by mouth daily.    Dispense:  90 tablet    Refill:  3    Patient Instructions  Medication Instructions:  START Zetia 10 mg daily  *If you need a refill on your cardiac medications before your next appointment, please call your pharmacy*   Lab Work:  CMET, CBC today  If you have labs (blood work) drawn today and your tests are completely normal, you will receive your results only by: MyChart Message (if you have MyChart) OR A paper copy in the mail If you have any lab test that is abnormal or we need to change your treatment, we will call you to review the results.   Testing/Procedures:  Your physician has requested that you have a cardiac catheterization. Cardiac catheterization is used to diagnose and/or treat various heart conditions. Doctors may recommend this procedure for a number of different reasons. The most common reason is to evaluate chest pain. Chest pain can be a symptom of coronary artery disease (CAD), and cardiac catheterization can show whether plaque is narrowing or blocking your heart's arteries. This procedure is also used to evaluate the valves, as well as measure the blood flow and oxygen levels in  different parts of your heart. For further information please visit [email or web address] Please follow instruction sheet, as given.    Follow-Up:  At [facility], you and your health needs are our priority.  As part of our continuing mission to provide you with exceptional heart care, we have created designated Provider Care Teams.  These Care Teams include your primary Cardiologist (physician) and Advanced Practice Providers (APPs -  Physician Assistants and Nurse Practitioners) who all work together to provide you with the care you need, when you need it.  We recommend signing up for the patient portal called "MyChart".  Sign up information is provided on this After Visit Summary.  MyChart is used to connect with patients for Virtual Visits (Telemedicine).  Patients are able to view lab/test results, encounter notes, upcoming appointments, etc.  Non-urgent messages can be sent to your provider as well.   To learn more about what you can do with MyChart, go to [email or web address].    Your next appointment:   2 week(s) post CATH  Provider:   Lennie Odor, MD   Other Instructions       Cardiac/Peripheral Catheterization   You are scheduled for a Cardiac Catheterization on Wednesday, May 15 with Dr. Bryan Lemma.  1. Please arrive at the [facility] (Main Entrance A) at [facility]: [address] [place], [place] [address] at 9:30 AM (This time is 2 hour(s) before your procedure to ensure your preparation). Free valet parking service is available. You will check in at ADMITTING. The support person will be asked to wait in the waiting room.  It is OK to have someone drop you off and come  back when you are ready to be discharged.        Special note: Every effort is made to have your procedure done on time. Please understand that emergencies sometimes delay scheduled procedures.  2. Diet: Do not eat solid foods after midnight.  You may have clear liquids until 5  AM the day of the procedure.  3. Labs: You will need to have blood drawn today- CBC, BMET. You do not need to be fasting.  4. Medication instructions in preparation for your procedure:   Contrast Allergy: No   Do not take Diabetes Med Glucophage (Metformin) on the day of the procedure and HOLD 48 HOURS AFTER THE PROCEDURE.  On the morning of your procedure, take Aspirin 81 mg and any morning medicines NOT listed above.  You may use sips of water.  5. Plan to go home the same day, you will only stay overnight if medically necessary. 6. You MUST have a responsible adult to drive you home. 7. An adult MUST be with you the first 24 hours after you arrive home. 8. Bring a current list of your medications, and the last time and date medication taken. 9. Bring ID and current insurance cards. 10.Please wear clothes that are easy to get on and off and wear slip-on shoes.  Thank you for allowing [place] to care for you!   -- [facility]     Time Spent with Patient: I have spent a total of 35 minutes with patient reviewing hospital notes, telemetry, EKGs, labs and examining the patient as well as establishing an assessment and plan that was discussed with the patient.  > 50% of time was spent in direct patient care.  Signed, Lenna Gilford. Flora Lipps, MD, [facility]  [facility]  [address] [place], [place] [address] [phone]  03/25/2023 2:35 PM

## 2023-03-24 NOTE — H&P (View-Only) (Signed)
Cardiology Office Note:   Date:  03/25/2023  NAME:  Vernon [name]    MRN: [id number] DOB:  [birth year]   PCP:  Gracelyn Nurse, MD  Cardiologist:  None  Electrophysiologist:  Lanier Prude, MD   Referring MD: Gracelyn Nurse, MD   Chief Complaint  Patient presents with   Follow-up        History of Present Illness:   Vernon [name] is a 77 y.o. male with a hx of CAD, permanent Afib, MDS, who presents for follow-up.  He presents with his wife.  Reports he is feeling stable.  No more shortness of breath than normal.  Has chronic kidney disease as well as mild dysplastic syndrome.  Anemia is stable.  Reports no chest pain.  Did suffer a fall at church.  Having some pain in the left chest which is likely musculoskeletal.  We discussed a stress test which shows reduced myocardial blood flow reserve.  Concerns for three-vessel CAD in my opinion.  Also echocardiogram is concerning for severe aortic stenosis.  We discussed left heart catheterization to help clarify the diagnosis.  He is willing to proceed with this.  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 Permanent Afib -s/p Watchman 06/2022 3. CAD -CAC 5415 (97th percentile)  -Cardiac PET 02/26/2023: normal perfusion, MBFR 1.41 4. Moderate to severe aortic stenosis  -MG 20 mmHG, AVA 0.8 cm2, DI 0.29 5. HTN 6. HLD -T chol 187, HDL 25, LDL 136, TG 128 7. MDS 8. Anemia 9. CKD IIIa 10. DM -A1c 5.7 11. HFpEF  Past Medical History: Past Medical History:  Diagnosis Date   Anemia    Aortic stenosis    Arthritis    Complication of anesthesia    hard time getting bp up after knee replacement   Coronary artery disease    Diabetes mellitus without complication (HCC)    GERD (gastroesophageal reflux disease)    occ tums prn   History of hiatal hernia    Hypertension    MDS (myelodysplastic syndrome) (HCC)    Presence of Watchman left atrial appendage closure device 07/26/2022   27mm Watchman FLX with Dr. 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;  Surgeon: Barnett Abu, MD;  Location: [facility] OR;  Service: Neurosurgery;  Laterality: N/A;   JOINT REPLACEMENT Right 2010   LEFT ATRIAL APPENDAGE OCCLUSION N/A 07/26/2022   Procedure: LEFT ATRIAL APPENDAGE OCCLUSION;  Surgeon: Lanier Prude, MD;  Location: [facility] INVASIVE CV LAB;  Service: Cardiovascular;  Laterality: N/A;   SHOULDER ARTHROSCOPY WITH ROTATOR CUFF REPAIR AND OPEN BICEPS TENODESIS Right 11/09/2019   Procedure: RIGHT SHOULDER ARTHROSCOPY WITH SUBSCAPULARIS REPAIR, SUBACROMIAL DECOMPRESSION,MINI OPEN ROTATOR CUFF REPAIR;  Surgeon: Signa Kell, MD;  Location: [facility] ORS;  Service: Orthopedics;  Laterality: Right;   TEE WITHOUT CARDIOVERSION N/A 07/26/2022   Procedure: TRANSESOPHAGEAL ECHOCARDIOGRAM (TEE);  Surgeon: Lanier Prude, MD;  Location: [facility] INVASIVE CV LAB;  Service: Cardiovascular;  Laterality: N/A;    Current Medications: Current Meds  Medication Sig   aspirin EC 81 MG tablet Take 1 tablet (81 mg total) by mouth daily. Swallow whole.   ezetimibe (ZETIA) 10 MG tablet Take 1 tablet (10 mg total) by mouth daily.   furosemide (LASIX) 20 MG tablet Take 20 mg by mouth in the morning and at  bedtime.   metFORMIN (GLUCOPHAGE) 500 MG tablet Take 500 mg by mouth 2 (two) times daily with a meal.   Multiple Vitamin (MULTI-VITAMIN) tablet Take 1 tablet by mouth daily.   ondansetron (ZOFRAN-ODT) 4 MG disintegrating tablet Take 4 mg by mouth every 8 (eight) hours as needed.   polyethylene glycol powder (GLYCOLAX/MIRALAX) 17 GM/SCOOP powder Take 1 Container by mouth daily as needed.   [DISCONTINUED] furosemide (LASIX) 20 MG tablet Take 1 tablet (20 mg total) by mouth daily as needed. (Patient taking differently: Take 20 mg by mouth in the morning and at bedtime.)   Current  Facility-Administered Medications for the 03/25/23 encounter (Office Visit) with Sande Rives, MD  Medication   0.9 %  sodium chloride infusion   0.9 %  sodium chloride infusion     Allergies:    Patient has no known allergies.   Social History: Social History   Socioeconomic History   Marital status: Married    Spouse name: Not on file   Number of children: 2   Years of education: Not on file   Highest education level: Not on file  Occupational History   Occupation: Retired - [employer]  Tobacco Use   Smoking status: Former    Packs/day: 0.50    Years: 8.00    Additional pack years: 0.00    Total pack years: 4.00    Types: Cigarettes    Quit date: 07/21/1978    Years since quitting: 44.7   Smokeless tobacco: Never  Vaping Use   Vaping Use: Never used  Substance and Sexual Activity   Alcohol use: Yes    Alcohol/week: 0.0 - 1.0 standard drinks of alcohol    Comment: rare beer   Drug use: Never   Sexual activity: Not on file  Other Topics Concern   Not on file  Social History Narrative   Lives in [place]; with wife; quit smoking in early 62s; ocassional/ rare [may be 1 a month beer]. 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    Social Determinants of Health   Financial Resource Strain: Not on file  Food Insecurity: No Food Insecurity (12/18/2022)   Hunger Vital Sign    Worried About Running Out of Food in the Last Year: Never true    Ran Out of Food in the Last Year: Never true  Transportation Needs: No Transportation Needs (12/18/2022)   PRAPARE - Administrator, Civil Service (Medical): No    Lack of Transportation (Non-Medical): No  Physical Activity: Not on file  Stress: Not on file  Social Connections: Not on file     Family History: The patient's family history includes Cancer in his maternal uncle.  ROS:   All other ROS reviewed and negative. Pertinent positives noted in the HPI.     EKGs/Labs/Other Studies Reviewed:   The  following studies were personally reviewed by me today:   EKG:  EKG is ordered today.  The ekg ordered today demonstrates Atrial fibrillation heart rate 61, PVCs noted, and was personally reviewed by me.   Recent Labs: 12/19/2022: Magnesium 1.6 01/09/2023: ALT 21 01/14/2023: BNP 341.9 03/06/2023: BUN 23; Creatinine 1.21; Platelet Count 233; Potassium 4.2; Sodium 137 03/20/2023: Hemoglobin 9.8   Recent Lipid Panel    Component Value Date/Time   CHOL 187 12/19/2022 1029   TRIG 128 12/19/2022 1029   HDL 25 (L) 12/19/2022 1029   CHOLHDL 7.5 12/19/2022 1029   VLDL 26 12/19/2022 1029   LDLCALC 136 (H) 12/19/2022  1029    Physical Exam:   VS:  BP 136/70   Pulse (!) 49   Ht 5\' 9"  (1.753 m)   Wt 191 lb (86.6 kg)   SpO2 95%   BMI 28.21 kg/m    Wt Readings from Last 3 Encounters:  03/25/23 191 lb (86.6 kg)  03/06/23 193 lb 11.2 oz (87.9 kg)  01/14/23 200 lb (90.7 kg)    General: Well nourished, well developed, in no acute distress Head: Atraumatic, normal size  Eyes: PEERLA, EOMI  Neck: Supple, no JVD Endocrine: No thryomegaly Cardiac: Normal S1, S2; irregular rhythm, 3 out of 6 systolic ejection murmur, late peaking Lungs: Clear to auscultation bilaterally, no wheezing, rhonchi or rales  Abd: Soft, nontender, no hepatomegaly  Ext: No edema, pulses 2+ Musculoskeletal: No deformities, BUE and BLE strength normal and equal Skin: Warm and dry, no rashes   Neuro: Alert and oriented to person, place, time, and situation, CNII-XII grossly intact, no focal deficits  Psych: Normal mood and affect   ASSESSMENT:   Vernon [name] is a 77 y.o. male who presents for the following: 1. SOB (shortness of breath) on exertion   2. Abnormal nuclear stress test   3. Coronary artery disease involving native coronary artery of native heart without angina pectoris   4. Agatston coronary artery calcium score greater than 400   5. Mixed hyperlipidemia   6. Moderate aortic valve stenosis   7. Permanent  atrial fibrillation (HCC)   8. Acquired thrombophilia (HCC)   9. Chronic diastolic heart failure (HCC)     PLAN:   1. SOB (shortness of breath) on exertion 2. Abnormal nuclear stress test 3. Coronary artery disease involving native coronary artery of native heart without angina pectoris 4. Agatston coronary artery calcium score greater than 400 5. Mixed hyperlipidemia -Severely elevated coronary calcium score.  Cardiac PET shows reduced myocardial blood flow reserve which is concerning for three-vessel CAD.  He has evidence of heart failure preserved ejection fraction.  He has evidence of RV failure.  I have recommended a left and right heart catheterization to clarify the diagnosis as well as measure his heart pressures.  I am also concerned about his aortic valve.  Suspect this is moderate to severe.  Examination more consistent with severe aortic stenosis.  I believe catheterization can help clarify the diagnosis as well.  He did have a CT scan as part of watchman workup which showed a calcium score of the aortic valve above 2000.  This is consistent with severe of stenosis.  Unclear if he will need TAVR versus bypass surgery with SAVR. -He will continue aspirin.  Reports no angina.  We discussed nonstatin medications.  He was told not to take statins by his kidney doctor.  We will pursue Zetia.  We will reach out to his kidney doctor to see if it is okay for him to be on a statin.  6. Moderate aortic valve stenosis -Suspect more consistent with severe arctic stenosis.  Left and right heart catheterization as above.  7. Permanent atrial fibrillation (HCC) -Status post watchman.  Not on anticoagulation.  8. Acquired thrombophilia (HCC) -Status post watchman  9. Chronic diastolic heart failure (HCC) -Evidence of diastolic heart failure.  Plan for left and right heart catheterization.  Concern for three-vessel CAD as above.  Also aortic valve.   Shared Decision Making/Informed Consent The  risks [stroke (1 in 1000), death (1 in 1000), kidney failure [usually temporary] (1 in 500), bleeding (1 in  200), allergic reaction [possibly serious] (1 in 200)], benefits (diagnostic support and management of coronary artery disease) and alternatives of a cardiac catheterization were discussed in detail with Vernon [name] and he is willing to proceed.  Disposition: Return in about 1 month (around 04/24/2023).  Medication Adjustments/Labs and Tests Ordered: Current medicines are reviewed at length with the patient today.  Concerns regarding medicines are outlined above.  Orders Placed This Encounter  Procedures   CBC   Comprehensive metabolic panel   Meds ordered this encounter  Medications   ezetimibe (ZETIA) 10 MG tablet    Sig: Take 1 tablet (10 mg total) by mouth daily.    Dispense:  90 tablet    Refill:  3    Patient Instructions  Medication Instructions:  START Zetia 10 mg daily  *If you need a refill on your cardiac medications before your next appointment, please call your pharmacy*   Lab Work:  CMET, CBC today  If you have labs (blood work) drawn today and your tests are completely normal, you will receive your results only by: MyChart Message (if you have MyChart) OR A paper copy in the mail If you have any lab test that is abnormal or we need to change your treatment, we will call you to review the results.   Testing/Procedures:  Your physician has requested that you have a cardiac catheterization. Cardiac catheterization is used to diagnose and/or treat various heart conditions. Doctors may recommend this procedure for a number of different reasons. The most common reason is to evaluate chest pain. Chest pain can be a symptom of coronary artery disease (CAD), and cardiac catheterization can show whether plaque is narrowing or blocking your heart's arteries. This procedure is also used to evaluate the valves, as well as measure the blood flow and oxygen levels in  different parts of your heart. For further information please visit [email or web address] Please follow instruction sheet, as given.    Follow-Up:  At [facility], you and your health needs are our priority.  As part of our continuing mission to provide you with exceptional heart care, we have created designated Provider Care Teams.  These Care Teams include your primary Cardiologist (physician) and Advanced Practice Providers (APPs -  Physician Assistants and Nurse Practitioners) who all work together to provide you with the care you need, when you need it.  We recommend signing up for the patient portal called "MyChart".  Sign up information is provided on this After Visit Summary.  MyChart is used to connect with patients for Virtual Visits (Telemedicine).  Patients are able to view lab/test results, encounter notes, upcoming appointments, etc.  Non-urgent messages can be sent to your provider as well.   To learn more about what you can do with MyChart, go to [email or web address].    Your next appointment:   2 week(s) post CATH  Provider:   Lennie Odor, MD   Other Instructions       Cardiac/Peripheral Catheterization   You are scheduled for a Cardiac Catheterization on Wednesday, May 15 with Dr. Bryan Lemma.  1. Please arrive at the [facility] (Main Entrance A) at [facility]: [address] [place], [place] [address] at 9:30 AM (This time is 2 hour(s) before your procedure to ensure your preparation). Free valet parking service is available. You will check in at ADMITTING. The support person will be asked to wait in the waiting room.  It is OK to have someone drop you off and come  back when you are ready to be discharged.        Special note: Every effort is made to have your procedure done on time. Please understand that emergencies sometimes delay scheduled procedures.  2. Diet: Do not eat solid foods after midnight.  You may have clear liquids until 5  AM the day of the procedure.  3. Labs: You will need to have blood drawn today- CBC, BMET. You do not need to be fasting.  4. Medication instructions in preparation for your procedure:   Contrast Allergy: No   Do not take Diabetes Med Glucophage (Metformin) on the day of the procedure and HOLD 48 HOURS AFTER THE PROCEDURE.  On the morning of your procedure, take Aspirin 81 mg and any morning medicines NOT listed above.  You may use sips of water.  5. Plan to go home the same day, you will only stay overnight if medically necessary. 6. You MUST have a responsible adult to drive you home. 7. An adult MUST be with you the first 24 hours after you arrive home. 8. Bring a current list of your medications, and the last time and date medication taken. 9. Bring ID and current insurance cards. 10.Please wear clothes that are easy to get on and off and wear slip-on shoes.  Thank you for allowing [place] to care for you!   -- [facility]     Time Spent with Patient: I have spent a total of 35 minutes with patient reviewing hospital notes, telemetry, EKGs, labs and examining the patient as well as establishing an assessment and plan that was discussed with the patient.  > 50% of time was spent in direct patient care.  Signed, Lenna Gilford. Flora Lipps, MD, [facility]  [facility]  [address] [place], [place] [address] [phone]  03/25/2023 2:35 PM

## 2023-03-25 ENCOUNTER — Ambulatory Visit: Payer: Medicare Other | Admitting: Occupational Therapy

## 2023-03-25 ENCOUNTER — Encounter: Payer: Self-pay | Admitting: Cardiovascular Disease

## 2023-03-25 ENCOUNTER — Ambulatory Visit: Payer: Medicare Other | Admitting: Physical Therapy

## 2023-03-25 ENCOUNTER — Ambulatory Visit: Payer: Medicare Other | Attending: Cardiovascular Disease | Admitting: Cardiovascular Disease

## 2023-03-25 VITALS — BP 136/70 | HR 49 | Ht 69.0 in | Wt 191.0 lb

## 2023-03-25 DIAGNOSIS — R931 Abnormal findings on diagnostic imaging of heart and coronary circulation: Secondary | ICD-10-CM | POA: Diagnosis not present

## 2023-03-25 DIAGNOSIS — I5032 Chronic diastolic (congestive) heart failure: Secondary | ICD-10-CM | POA: Diagnosis not present

## 2023-03-25 DIAGNOSIS — D6869 Other thrombophilia: Secondary | ICD-10-CM

## 2023-03-25 DIAGNOSIS — I2510 Atherosclerotic heart disease of native coronary artery without angina pectoris: Secondary | ICD-10-CM

## 2023-03-25 DIAGNOSIS — R9439 Abnormal result of other cardiovascular function study: Secondary | ICD-10-CM

## 2023-03-25 DIAGNOSIS — R0602 Shortness of breath: Secondary | ICD-10-CM

## 2023-03-25 DIAGNOSIS — I350 Nonrheumatic aortic (valve) stenosis: Secondary | ICD-10-CM

## 2023-03-25 DIAGNOSIS — E782 Mixed hyperlipidemia: Secondary | ICD-10-CM

## 2023-03-25 DIAGNOSIS — I4821 Permanent atrial fibrillation: Secondary | ICD-10-CM

## 2023-03-25 MED ORDER — EZETIMIBE 10 MG PO TABS: 10.0000 mg | ORAL_TABLET | Freq: Every day | ORAL | 3 refills | Status: DC

## 2023-03-25 NOTE — Therapy (Deleted)
OUTPATIENT PHYSICAL THERAPY TREATMENT  Patient Name: Vernon [name] MRN: [id number] DOB:[birth year], 77 y.o., male Today's Date: 03/25/2023   PCP:  Gracelyn Nurse, MD   REFERRING PROVIDER:  Gracelyn Nurse, MD    END OF SESSION:       Past Medical History:  Diagnosis Date   Anemia    Aortic stenosis    Arthritis    Complication of anesthesia    hard time getting bp up after knee replacement   Coronary artery disease    Diabetes mellitus without complication (HCC)    GERD (gastroesophageal reflux disease)    occ tums prn   History of hiatal hernia    Hypertension    MDS (myelodysplastic syndrome) (HCC)    Presence of Watchman left atrial appendage closure device 07/26/2022   27mm Watchman FLX with Dr. Lalla Brothers   Past Surgical History:  Procedure Laterality Date   CARPAL TUNNEL RELEASE  2012   CRANIOTOMY N/A 02/10/2022   Procedure: SUBOCCIPITAL CRANIECTOMY FOR EVACUATION OF CEREBELLAR HEMATOMA;  Surgeon: Barnett Abu, MD;  Location: [facility] OR;  Service: Neurosurgery;  Laterality: N/A;   JOINT REPLACEMENT Right 2010   LEFT ATRIAL APPENDAGE OCCLUSION N/A 07/26/2022   Procedure: LEFT ATRIAL APPENDAGE OCCLUSION;  Surgeon: Lanier Prude, MD;  Location: [facility] INVASIVE CV [facility];  Service: Cardiovascular;  Laterality: N/A;   SHOULDER ARTHROSCOPY WITH ROTATOR CUFF REPAIR AND OPEN BICEPS TENODESIS Right 11/09/2019   Procedure: RIGHT SHOULDER ARTHROSCOPY WITH SUBSCAPULARIS REPAIR, SUBACROMIAL DECOMPRESSION,MINI OPEN ROTATOR CUFF REPAIR;  Surgeon: Signa Kell, MD;  Location: [facility] ORS;  Service: Orthopedics;  Laterality: Right;   TEE WITHOUT CARDIOVERSION N/A 07/26/2022   Procedure: TRANSESOPHAGEAL ECHOCARDIOGRAM (TEE);  Surgeon: Lanier Prude, MD;  Location: [facility] INVASIVE CV LAB;  Service: Cardiovascular;  Laterality: N/A;   Patient Active Problem List   Diagnosis Date Noted   Urinary tract infection without hematuria 12/19/2022   Painless jaundice 12/18/2022   Elevated LFTs  12/18/2022   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   Obstructive jaundice 12/17/2022   Atrial fibrillation (HCC) 07/26/2022   Presence of Watchman left atrial appendage closure device 07/26/2022   Proteinuria, unspecified 05/23/2022   Simple renal cyst 05/23/2022   Long term current use of anticoagulant 04/04/2022   Rotator cuff syndrome 04/04/2022   Exposure to potentially hazardous substance 04/04/2022   Gout 04/04/2022   Osteoarthritis 04/04/2022   Osteoporosis 04/04/2022   Other specified diseases of hair and hair follicles 04/04/2022   Pain in joint, lower leg 04/04/2022   Problem related to unspecified psychosocial circumstances 04/04/2022   General medical examination for administrative purposes 04/04/2022   Stage 3b chronic kidney disease (HCC)    Dysphagia, post-stroke    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   Cough 02/20/2022   Acute metabolic encephalopathy 02/20/2022   Obstructive hydrocephalus (HCC) 02/19/2022   Dyslipidemia 02/19/2022   Dysphagia 02/19/2022   AKI (acute kidney injury) (HCC)    Anemia    Pleural effusion on right    Respiratory failure requiring intubation (HCC)    Cerebral edema (HCC)    Chronic atrial fibrillation (HCC)    Controlled type 2 diabetes mellitus with hyperglycemia, without long-term current use of insulin (HCC)    ICH (intracerebral hemorrhage) (HCC) 02/10/2022   Intracranial hemorrhage (HCC)    Anticoagulated    Moderate tricuspid regurgitation 07/26/2021   Moderate aortic valve stenosis 03/08/2020   MDS (myelodysplastic syndrome) (HCC) 02/18/2020   Macrocytic anemia 01/13/2020   Spleen enlargement 01/13/2020   Bilateral carotid  artery stenosis 07/14/2018   Atrial fibrillation, chronic (HCC) 07/14/2018   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   Essential hypertension 08/16/2014   Hyperlipemia, mixed 08/16/2014   Renal insufficiency 08/16/2014    ONSET DATE: 02/10/2022  REFERRING DIAG:  I63.9  (ICD-10-CM) - Cerebral infarction, unspecified  I63.9 (ICD-10-CM) - CVA (cerebral vascular accident) (HCC)    THERAPY DIAG:  No diagnosis found.  Rationale for Evaluation and Treatment: Rehabilitation  SUBJECTIVE:                                                                                                                                                                                             SUBJECTIVE STATEMENT: Patient reports having a good weekend with no falls. States wife has returned to work following her hip surgery.  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 Pt hospitalized 1/22-1/25/24 for obstructive jaundice. Pt reports it was found he had a UTI with recent hospital stay. He wants to improve his endurance and balance. PMH significant for MDS, chronic a-fib, HTN, DMII, Presence of Watchman Left atrial appendage closure device, anemia, arthritis, hx of hiatal hernia, hx craniotomy 02/10/22, L atrial appendage occlusion 07/26/22, TEE without cardioversion 07/26/2022.     PAIN:  Are you having pain? Yes, 2/10 Left knee resting, worse with standing, walking, terminal extension  PRECAUTIONS: Fall  WEIGHT BEARING RESTRICTIONS: No  FALLS: Has patient fallen in last 6 months? No  PATIENT GOALS: improve coordination, endurance and balance  OBJECTIVE:    INTERVENTION THIS DATE:     NMR:   Forward/retro step up/over 2 1/2 foam roller x 10 Side step up and over 1/2 foam roll x 15 reps  Tandem walking in // bars with minimal UE support as needed- down and back x 6  SLS- multiple attempts each LE- at best 8 sec either leg yet majority between 2-3 with mostly ant or lateral LOB Ambulation with 4WW (focusing on left UE control of walker) x 160 feet  (several bouts of regrouping as left UE would intermittently jerk walker to left)   Therex:  Standing in // bars- forward high knee march  down and then retro steps back to start x3 Gait using upright rollator - 5 min - total over 850 feet- with 3# AW  PATIENT EDUCATION: Education details: exam findings, plan, testing Person educated: Patient and Spouse Education method: Explanation Education comprehension: verbalized understanding  HOME EXERCISE PROGRAM: To be initiated next 1-2 visits   GOALS: Goals reviewed with patient? Yes  SHORT TERM GOALS: Target date:  02/20/2023  Patient will be independent in home exercise program to improve strength/mobility for better functional independence with ADLs. Baseline: to be intiated; 02/13/2023= Patient reports no issues with current HEP.  Goal status: MET   LONG TERM GOALS: Target date: 04/03/2023   Patient will complete five times sit to stand test in < 10 seconds indicating an increased LE strength and improved balance. Baseline: 14.89 sec; 02/13/2023= 11.99 without UE Support Goal status: Progressing  2.  Patient will increase 10 meter walk test to >1.25m/s as to improve gait speed for better community ambulation and to reduce fall risk. Baseline:  0.82 m/s; 02/13/2023= 1.03 m/s with RW Goal status: MET  3.  Patient will increase Berg Balance score by > 6 points to demonstrate decreased fall risk during functional activities. Baseline: 39/56; 02/13/2023=41/56 Goal status: Progressing  4.  Patient will increase six minute walk test distance to >1000 for progression to community ambulator and improve gait ability Baseline: 795'; 02/13/2023= 890 feet with RW and 2 brief standing rest breaks Goal status: Progressing  5.  Patient will demonstrate improved function as evidenced by a score of 62 on FOTO measure for full participation in activities at home and in the community. Baseline: 59; 02/13/2023=62 Goal status: Progressing  ASSESSMENT:  CLINICAL IMPRESSION: Treatment continued to focus on dynamic balance and gait endurance.  He was able to demonstrate improved single legs  standing ability. Pt will continue to benefit from skilled PT services to address deficits and impairment identified in evaluation in order to maximize independence and safety in basic mobility required for performance of ADL, IADL, and leisure.     OBJECTIVE IMPAIRMENTS: Abnormal gait, decreased activity tolerance, decreased balance, decreased coordination, decreased endurance, decreased knowledge of use of DME, decreased mobility, difficulty walking, decreased strength, impaired UE functional use, improper body mechanics, postural dysfunction, and pain.   ACTIVITY LIMITATIONS: carrying, lifting, bending, standing, squatting, stairs, transfers, and locomotion level  PARTICIPATION LIMITATIONS: meal prep, cleaning, laundry, driving, shopping, community activity, and yard work  PERSONAL FACTORS: Age, Past/current experiences, Time since onset of injury/illness/exacerbation, and 3+ comorbidities: PMH significant for MDS, chronic a-fib, HTN, DMII, Presence of Watchman Left atrial appendage closure device, anemia, arthritis, hx of hiatal hernia, hx craniotomy 02/10/22, L atrial appendage occlusion 07/26/22, TEE without cardioversion 07/26/2022  are also affecting patient's functional outcome.   REHAB POTENTIAL: Good  CLINICAL DECISION MAKING: Evolving/moderate complexity  EVALUATION COMPLEXITY: Moderate  PLAN:  PT FREQUENCY: 2x/week  PT DURATION: 12 weeks  PLANNED INTERVENTIONS: Therapeutic exercises, Therapeutic activity, Neuromuscular re-education, Balance training, Gait training, Patient/Family education, Self Care, Joint mobilization, Stair training, Vestibular training, Canalith repositioning, Visual/preceptual remediation/compensation, Orthotic/Fit training, DME instructions, Electrical stimulation, Wheelchair mobility training, Spinal mobilization, Cryotherapy, Moist heat, Splintting, Taping, Ultrasound, Manual therapy, and Re-evaluation  PLAN FOR NEXT SESSION:  Continue with strengthening  exercises and balance related tasks as tolerated given his left knee DJD limitations  Norman Herrlich PT ,DPT Physical Therapist- [place]  [facility]   8:21 AM 03/25/23    03/25/23, 8:21 AM

## 2023-03-25 NOTE — Patient Instructions (Addendum)
Medication Instructions:  START Zetia 10 mg daily  *If you need a refill on your cardiac medications before your next appointment, please call your pharmacy*   Lab Work:  CMET, CBC today  If you have labs (blood work) drawn today and your tests are completely normal, you will receive your results only by: MyChart Message (if you have MyChart) OR A paper copy in the mail If you have any lab test that is abnormal or we need to change your treatment, we will call you to review the results.   Testing/Procedures:  Your physician has requested that you have a cardiac catheterization. Cardiac catheterization is used to diagnose and/or treat various heart conditions. Doctors may recommend this procedure for a number of different reasons. The most common reason is to evaluate chest pain. Chest pain can be a symptom of coronary artery disease (CAD), and cardiac catheterization can show whether plaque is narrowing or blocking your heart's arteries. This procedure is also used to evaluate the valves, as well as measure the blood flow and oxygen levels in different parts of your heart. For further information please visit [email or web address] Please follow instruction sheet, as given.    Follow-Up:  At [facility], you and your health needs are our priority.  As part of our continuing mission to provide you with exceptional heart care, we have created designated Provider Care Teams.  These Care Teams include your primary Cardiologist (physician) and Advanced Practice Providers (APPs -  Physician Assistants and Nurse Practitioners) who all work together to provide you with the care you need, when you need it.  We recommend signing up for the patient portal called "MyChart".  Sign up information is provided on this After Visit Summary.  MyChart is used to connect with patients for Virtual Visits (Telemedicine).  Patients are able to view lab/test results, encounter notes, upcoming appointments,  etc.  Non-urgent messages can be sent to your provider as well.   To learn more about what you can do with MyChart, go to [email or web address].    Your next appointment:   2 week(s) post CATH  Provider:   Lennie Odor, MD   Other Instructions       Cardiac/Peripheral Catheterization   You are scheduled for a Cardiac Catheterization on Wednesday, May 15 with Dr. Bryan Lemma.  1. Please arrive at the [facility] (Main Entrance A) at [facility]: [address] [place], [place] [address] at 9:30 AM (This time is 2 hour(s) before your procedure to ensure your preparation). Free valet parking service is available. You will check in at ADMITTING. The support person will be asked to wait in the waiting room.  It is OK to have someone drop you off and come back when you are ready to be discharged.        Special note: Every effort is made to have your procedure done on time. Please understand that emergencies sometimes delay scheduled procedures.  2. Diet: Do not eat solid foods after midnight.  You may have clear liquids until 5 AM the day of the procedure.  3. Labs: You will need to have blood drawn today- CBC, BMET. You do not need to be fasting.  4. Medication instructions in preparation for your procedure:   Contrast Allergy: No   Do not take Diabetes Med Glucophage (Metformin) on the day of the procedure and HOLD 48 HOURS AFTER THE PROCEDURE.  On the morning of your procedure, take Aspirin 81 mg and any morning  medicines NOT listed above.  You may use sips of water.  5. Plan to go home the same day, you will only stay overnight if medically necessary. 6. You MUST have a responsible adult to drive you home. 7. An adult MUST be with you the first 24 hours after you arrive home. 8. Bring a current list of your medications, and the last time and date medication taken. 9. Bring ID and current insurance cards. 10.Please wear clothes that are easy to get on and off and  wear slip-on shoes.  Thank you for allowing [place] to care for you!   -- [facility]

## 2023-03-26 LAB — CBC
Hematocrit: 29.5 % — ABNORMAL LOW (ref 37.5–51.0)
Hemoglobin: 9.5 g/dL — ABNORMAL LOW (ref 13.0–17.7)
MCH: 30.2 pg (ref 26.6–33.0)
MCHC: 32.2 g/dL (ref 31.5–35.7)
MCV: 94 fL (ref 79–97)
NRBC: 1 % — ABNORMAL HIGH (ref 0–0)
Platelets: 295 10*3/uL (ref 150–450)
RBC: 3.15 x10E6/uL — ABNORMAL LOW (ref 4.14–5.80)
RDW: 26.7 % — ABNORMAL HIGH (ref 11.6–15.4)
WBC: 5.8 10*3/uL (ref 3.4–10.8)

## 2023-03-26 LAB — COMPREHENSIVE METABOLIC PANEL
ALT: 15 IU/L (ref 0–44)
AST: 21 IU/L (ref 0–40)
Albumin/Globulin Ratio: 1.8 (ref 1.2–2.2)
Albumin: 4.8 g/dL (ref 3.8–4.8)
Alkaline Phosphatase: 117 IU/L (ref 44–121)
BUN/Creatinine Ratio: 15 (ref 10–24)
BUN: 23 mg/dL (ref 8–27)
Bilirubin Total: 2.2 mg/dL — ABNORMAL HIGH (ref 0.0–1.2)
CO2: 23 mmol/L (ref 20–29)
Calcium: 10.1 mg/dL (ref 8.6–10.2)
Chloride: 105 mmol/L (ref 96–106)
Creatinine, Ser: 1.57 mg/dL — ABNORMAL HIGH (ref 0.76–1.27)
Globulin, Total: 2.7 g/dL (ref 1.5–4.5)
Glucose: 114 mg/dL — ABNORMAL HIGH (ref 70–99)
Potassium: 5.3 mmol/L — ABNORMAL HIGH (ref 3.5–5.2)
Sodium: 144 mmol/L (ref 134–144)
Total Protein: 7.5 g/dL (ref 6.0–8.5)
eGFR: 45 mL/min/{1.73_m2} — ABNORMAL LOW (ref >59)

## 2023-03-27 ENCOUNTER — Ambulatory Visit: Payer: Medicare Other | Admitting: Physical Therapy

## 2023-03-27 ENCOUNTER — Encounter: Payer: Medicare Other | Admitting: Occupational Therapy

## 2023-03-27 NOTE — Addendum Note (Signed)
Addended by: Ferne Reus on: 03/27/2023 11:29 AM   Modules accepted: Orders

## 2023-04-01 ENCOUNTER — Ambulatory Visit: Payer: Medicare Other | Attending: Internal Medicine

## 2023-04-01 DIAGNOSIS — I69254 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: Secondary | ICD-10-CM | POA: Insufficient documentation

## 2023-04-01 DIAGNOSIS — R2689 Other abnormalities of gait and mobility: Secondary | ICD-10-CM | POA: Insufficient documentation

## 2023-04-01 DIAGNOSIS — R278 Other lack of coordination: Secondary | ICD-10-CM | POA: Diagnosis present

## 2023-04-01 DIAGNOSIS — R2681 Unsteadiness on feet: Secondary | ICD-10-CM | POA: Diagnosis present

## 2023-04-01 DIAGNOSIS — M6281 Muscle weakness (generalized): Secondary | ICD-10-CM | POA: Diagnosis present

## 2023-04-01 DIAGNOSIS — R262 Difficulty in walking, not elsewhere classified: Secondary | ICD-10-CM | POA: Diagnosis present

## 2023-04-01 NOTE — Therapy (Signed)
OUTPATIENT OCCUPATIONAL THERAPY NEURO DISCHARGE NOTE   Patient Name: Vernon [name] MRN: [id number] DOB:Nov 19, 1946, 77 y.o., male Today's Date: 04/01/2023  REFERRING PROVIDER:  Marcelino Duster, MD  END OF SESSION:  OT End of Session - 04/01/23 1439     Visit Number 18    Number of Visits 24    Date for OT Re-Evaluation 04/01/23    Authorization Time Period Progress reporting period starting 02/06/23    Progress Note Due on Visit 10    OT Start Time 1300    OT Stop Time 1345    OT Time Calculation (min) 45 min    Equipment Utilized During Treatment transport chair    Activity Tolerance Patient tolerated treatment well    Behavior During Therapy WFL for tasks assessed/performed            Past Medical History:  Diagnosis Date   Anemia    Aortic stenosis    Arthritis    Complication of anesthesia    hard time getting bp up after knee replacement   Coronary artery disease    Diabetes mellitus without complication (HCC)    GERD (gastroesophageal reflux disease)    occ tums prn   History of hiatal hernia    Hypertension    MDS (myelodysplastic syndrome) (HCC)    Presence of Watchman left atrial appendage closure device 07/26/2022   27mm Watchman FLX with Dr. Lalla Brothers   Past Surgical History:  Procedure Laterality Date   CARPAL TUNNEL RELEASE  2012   CRANIOTOMY N/A 02/10/2022   Procedure: SUBOCCIPITAL CRANIECTOMY FOR EVACUATION OF CEREBELLAR HEMATOMA;  Surgeon: Barnett Abu, MD;  Location: [facility] OR;  Service: Neurosurgery;  Laterality: N/A;   JOINT REPLACEMENT Right 2010   LEFT ATRIAL APPENDAGE OCCLUSION N/A 07/26/2022   Procedure: LEFT ATRIAL APPENDAGE OCCLUSION;  Surgeon: Lanier Prude, MD;  Location: [facility] INVASIVE CV LAB;  Service: Cardiovascular;  Laterality: N/A;   SHOULDER ARTHROSCOPY WITH ROTATOR CUFF REPAIR AND OPEN BICEPS TENODESIS Right 11/09/2019   Procedure: RIGHT SHOULDER ARTHROSCOPY WITH SUBSCAPULARIS REPAIR, SUBACROMIAL DECOMPRESSION,MINI OPEN ROTATOR CUFF  REPAIR;  Surgeon: Signa Kell, MD;  Location: [facility] ORS;  Service: Orthopedics;  Laterality: Right;   TEE WITHOUT CARDIOVERSION N/A 07/26/2022   Procedure: TRANSESOPHAGEAL ECHOCARDIOGRAM (TEE);  Surgeon: Lanier Prude, MD;  Location: [facility] INVASIVE CV LAB;  Service: Cardiovascular;  Laterality: N/A;   Patient Active Problem List   Diagnosis Date Noted   Urinary tract infection without hematuria 12/19/2022   Painless jaundice 12/18/2022   Elevated LFTs 12/18/2022   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   Obstructive jaundice 12/17/2022   Atrial fibrillation (HCC) 07/26/2022   Presence of Watchman left atrial appendage closure device 07/26/2022   Proteinuria, unspecified 05/23/2022   Simple renal cyst 05/23/2022   Long term current use of anticoagulant 04/04/2022   Rotator cuff syndrome 04/04/2022   Exposure to potentially hazardous substance 04/04/2022   Gout 04/04/2022   Osteoarthritis 04/04/2022   Osteoporosis 04/04/2022   Other specified diseases of hair and hair follicles 04/04/2022   Pain in joint, lower leg 04/04/2022   Problem related to unspecified psychosocial circumstances 04/04/2022   General medical examination for administrative purposes 04/04/2022   Stage 3b chronic kidney disease (HCC)    Dysphagia, post-stroke    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   Cough 02/20/2022   Acute metabolic encephalopathy 02/20/2022   Obstructive hydrocephalus (HCC) 02/19/2022   Dyslipidemia 02/19/2022   Dysphagia 02/19/2022   AKI (acute kidney injury) (HCC)    Anemia  Pleural effusion on right    Respiratory failure requiring intubation (HCC)    Cerebral edema (HCC)    Chronic atrial fibrillation (HCC)    Controlled type 2 diabetes mellitus with hyperglycemia, without long-term current use of insulin (HCC)    ICH (intracerebral hemorrhage) (HCC) 02/10/2022   Intracranial hemorrhage (HCC)    Anticoagulated    Moderate tricuspid regurgitation 07/26/2021   Moderate  aortic valve stenosis 03/08/2020   MDS (myelodysplastic syndrome) (HCC) 02/18/2020   Macrocytic anemia 01/13/2020   Spleen enlargement 01/13/2020   Bilateral carotid artery stenosis 07/14/2018   Atrial fibrillation, chronic (HCC) 07/14/2018   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   Essential hypertension 08/16/2014   Hyperlipemia, mixed 08/16/2014   Renal insufficiency 08/16/2014   ONSET DATE: 02/10/2022  REFERRING DIAG:  CVA  THERAPY DIAG:  Coordination Muscle weakness (generalized)  Other lack of coordination  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  Pt reports he's still sore but has no pain if he sits still.  Pt accompanied by: significant other  PERTINENT HISTORY: Pt. is a 60 y. 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 Pt. received outpt. OT services following the CVA for Left sided weakness, and incoordination. Pt. was recently hospitalized from 1/22-1/25/24 for Obstructive Jaundice. PMHx includes: MDS, Chronic A-Fib, Essential HTN, Type II DM, Dyslipidemia, Presence of Watchman Left atrial appendage closure device, GERD  PRECAUTIONS: None  WEIGHT BEARING RESTRICTIONS: No  PAIN: rest 0/10, 8/10 pain in the L upper rib cage when "moving and turning," 2/10 pain with L hand fingers at rest, 8/10 pain in L RF when PIP joint triggers; rest and oval 8 finger splints both effective to reduce pain.  FALLS: Has patient fallen in last 6 months? No  LIVING ENVIRONMENT: Lives with: lives with their spouse Lives in: House/apartment one storey, can fully live on the 1st  floor. Stairs: Ramped entrance  Has following equipment at home: 4 wheeled walker, w/c-manual, grab bars, shower chair, HH shower head, toilet riser with arms.  PLOF: Independent  PATIENT GOALS: To improve arm and hand function   OBJECTIVE:   HAND DOMINANCE: Right  ADLs: Transfers/ambulation related to ADLs: Eating: Difficulty with motor control control Grooming:  Independent UB Dressing:  Diffiulty buttoning when no looking directly at the buttons.  LB Dressing: Independent donning pants, socks, and slide on shoes. Toileting: Independent Bathing: Independent, wife assists with towel. Tub Shower transfers: Modified Independence  in the shower, CGA transfers out of shoer.   IADLs: Shopping:  Pt.'s wife performs, occasionally uses grocery store scooters Light housekeeping: Wife performs housekeeping tasks. Meal Prep: Prepares light snacks, and light meal prep Community mobility: Relies on family and friends Medication management: Independent Financial management: Does  with wife; No changes in how they are doing them Handwriting: 100% legible for name: Wife has noticed some changes with writing  Hobbies: Plays the guitar, workshop, music shop.  MOBILITY STATUS: Needs Assist: uses a rollator  POSTURE COMMENTS:  No Significant postural limitations Sitting balance: WFL  ACTIVITY TOLERANCE: Activity tolerance:  Limited  FUNCTIONAL OUTCOME MEASURES:  FOTO: 59; TR score: 60 02/06/23: 61 04/01/23: 59  UPPER  EXTREMITY ROM:    Active ROM Right Eval  Left Eval [facility]  Shoulder flexion 106   Shoulder abduction 85   Shoulder adduction    Shoulder extension    Shoulder internal rotation    Shoulder external rotation    Elbow flexion WFL   Elbow extension WFL   Wrist flexion    Wrist extension WFL   Wrist ulnar deviation    Wrist radial deviation    Wrist pronation    Wrist supination WFL   (Blank rows = not tested)  Digit flexion to the [facility]:  2nd: 0cm, 3rd: 1.5cm,  4th: 2.5cm, 5th: 0cm  04/01/23: 2nd: 0 cm, 3rd: 0 cm, 4th: can touch to bottom of palm but pain with attempt at touching [facility]   UPPER EXTREMITY MMT:     MMT Right eval Left eval Left  04/01/23 (NT d/t pain in L upper rib cage)  Shoulder flexion 3- 4   Shoulder abduction 3- 4   Shoulder adduction     Shoulder extension     Shoulder internal rotation     Shoulder external rotation     Middle trapezius     Lower trapezius     Elbow flexion 4 4   Elbow extension 4 4   Wrist flexion     Wrist extension 4 4   Wrist ulnar deviation     Wrist radial deviation     Wrist pronation     Wrist supination     (Blank rows = not tested)  HAND FUNCTION: Eval: Grip strength: N/A secondary to arthritis, and 3rd digit trigger finger    Lateral Pinch: R: 23#, L: 20#  3 Pt. Pinch: R: 22#, L: 21# 02/06/23: Lateral Pinch: R: 22#, L: 20#  3 Pt. Pinch: R: 22#, L: 17# 04/01/23: R: 69 lbs; L 40 lbs Lateral pinch: R 23 lbs, L 18 lbs  3 point pinch: R 22 lbs, L: 16 lbs  COORDINATION: 9 Hole Peg test: Right: 25 sec; Left: 2 min. & 34  sec 02/06/23: 9 Hole Peg test: Left: 1 min. & 40 sec 04/01/23: R: 21 sec sec, Left: 1 min 34 sec  SENSATION: WFL Light touch: WFL Stereognosis: WFL  EDEMA:   N/A  MUSCLE TONE:  Intact  COGNITION: Overall cognitive status: Within functional limits for tasks assessed  VISION: Subjective report: Pt. reports double vision initially at the onset of the CVA with difficulty scanning to left when reading. Pt. Reports this has improved, and the double vision has resolved.   PERCEPTION: WFL  PRAXIS: Impaired motor control, and coordination  TODAY'S TREATMENT:       Therapeutic Activity: Objective measures taken and goals updated for discharge vs recertification.  Reviewed home activities to promote LUE GMC/FMC and positioning modifications to provide support throughout the LUE while playing his bass guitar.  Pt verbalized understanding of all education provided.  Reviewed  joint protection, ice massage, stretching, and soft tissue massage to reduce inflammation at the L LF and RF PIP joints and base of palm in this digits.  PATIENT EDUCATION: Education details: progress towards goals; positioning strategies for optimal support of LUE while playing bass guitar; ice massage and joint protection of L LF and RF trigger fingers Person educated: vc, Chemical engineer: explanation, Metallurgist, vc Education comprehension: verbalized understanding  Home Exercise Program:  Engaging the LUE into daily tasks, use of LUE for reaching/grasping ADL supplies  GOALS: Goals reviewed with patient?  SHORT TERM GOALS: Target date:  02/18/2023   Pt. will demonstrate independence with HEP for LUE functioning Baseline: Eval: No current HEP; 02/06/23: Pt consistently engaging the LUE into grasping and reaching for ADL supplies that can not be damaged if dropped; 04/01/23: pt continues to engage the LUE into daily tasks and demos understanding of trigger finger stretches, soft tissue massage, and ice massage (indep with contrast bath, as well) Goal status: achieved  LONG TERM GOALS: Target date:  04/01/2023  Pt. will improve FOTO score by 1 point for patient perceived improvement with assessment specific ADLs, and IADLs.  Baseline: Eval: FOTO score: 59; TR score: 60; 02/06/23: 61; 04/01/23: FOTO 59 Goal status: Not met (no consistent change in FOTO throughout episode)  2.  Pt. will independently modulate the accuracy of holding items without overgripping when reaching for items 50% of the time Baseline: Eval: Pt. is consistently overpowering, and over gripping items when grasping them with the left hand; 02/06/23: pt reports that he can tell he's improved with ability to press his bass strings without too much force.  Continued difficulty using correct force to grasp a cup; 04/01/23: pt verbalizes no significant improvement in ability to modulate grip when reaching for items with  LUE. Goal status: Not met  3.  Pt. will improve left hand [name] skills (Revised: 9 hole in < 1 min 15 sec) (initial: by 10 sec; achieved) to improve left hand grasp on objects.  Baseline: Eval: R: 25 sec. L: 2 min. & 34 sec; 02/06/23: L 1 min 40 sec; 04/01/23: 1 min 34 sec; pt reports improved ability to hold a drinking glass in L hand without having to press it against his chest or abdomen Goal status: Partially achieved  4.  Pt. Will utilize compensatory strategies for left hand function motor coordination 75% during ADLs, and IADL tasks. Baseline: Eval: Minimal use of compensatory strategies during ADLs, and IADLs; 02/06/23: Pt more consistently bearing weight into L forearm when performing FMC tasks to increase distal control; 04/01/23:  Pt consistently supports LUE on table top or other supportive surface to increase control of L hand for [facility] activities without prompting 100% of the time. Goal status: achieved  5.  Pt. Will use compensatory strategies for left hand function 75% of the time when utilizing multiple body systems, dual tasking, and using the hand in a variety of different contexts. Baseline: Eval: Pt. Consistently has difficulty using his left hand to complete tasks when he is not looking directly at his hand, and has more difficulty using his left hand when standing, or attempting to walk with the walker; 02/06/23: Pt more consistently bearing weight into L forearm when performing [facility] tasks to increase distal control, occasionally will stabilize L wrist with R hand, and initiates rest breaks when LUE is fatigued; 04/01/23: same as 02/06/23, though dual tasking continues to be challenging.  End of March pt arrived to therapy with black R eye, stating that he hit himself when his L arm flailed trying to get himself into bed over the weekend.  Goal status: improved/not met ASSESSMENT:   CLINICAL IMPRESSION: Pt seen this date for discharge vs recertification assessment.  Pt has been seen by OT for  18 sessions with focus on LUE GMC and [facility] skills following a CVA.  Pt has focussed on compensatory strategies to improve distal control d/t severe ataxia throughout the LUE.  When pt is able to WB through the elbow and forearm, pt demos improved FMC.  Though pt has improved his 9 hole  peg test score by 1 full min between evaluation and discharge, L hand is still quite impaired as compared to R hand (L 1 min 34 sec, R 21 sec).  Pt does endorse some improvement in LUE GMC/FMC activities and reports that he is more able to grasp a glass and carry it without having to press it against his chest or abdomen.  L hand strength measures show no significant improvement, but resistance training was avoided d/t L hand LF and RF triggering at the PIP joints.  Pt has been educated on joint protection strategies, stretching, soft tissue massage, ice massage, and use of contrast bath, and has been wearing oval 8 splints on these fingers, all of which have been effective in reducing triggering.  Pt reports that the LF is much better, but the RF still continues to be painful and stiff, though pt verbalizes understanding of joint protection and strategies to reduce pain and edema, and he will continue to utilize oval 8 splints as needed.  Pt and spouse report that they plan to move forward with trying some acupuncture for the LUE and would like to discharge OT at this time.  Pt was advised that he can request another referral from his MD should he decide that he would like to try another round of therapy again.  Pt verbalized understanding.  PERFORMANCE DEFICITS: in functional skills including ADLs, IADLs, coordination, dexterity, ROM, strength, Fine motor control, and Gross motor control, cognitive skills including attention, memory, problem solving, and safety awareness, and psychosocial skills including coping strategies, environmental adaptation, habits, and routines and behaviors.   IMPAIRMENTS: are limiting patient from  ADLs, IADLs, play, leisure, and social participation.   CO-MORBIDITIES: may have co-morbidities  that affects occupational performance. Patient will benefit from skilled OT to address above impairments and improve overall function.  MODIFICATION OR ASSISTANCE TO COMPLETE EVALUATION: Min-Moderate modification of tasks or assist with assess necessary to complete an evaluation.  OT OCCUPATIONAL PROFILE AND HISTORY: Detailed assessment: Review of records and additional review of physical, cognitive, psychosocial history related to current functional performance.  CLINICAL DECISION MAKING: Moderate - several treatment options, min-mod task modification necessary  REHAB POTENTIAL: Good  EVALUATION COMPLEXITY: Moderate   PLAN:  OT FREQUENCY: 2x/week  OT DURATION: 12 weeks  PLANNED INTERVENTIONS: self care/ADL training, therapeutic exercise, therapeutic activity, patient/family education, cognitive remediation/compensation, and DME and/or AE instructions, neuromuscular re-education  RECOMMENDED OTHER SERVICES: OT services.  CONSULTED AND AGREED WITH PLAN OF CARE: Patient and family member/caregiver-wife  PLAN FOR NEXT SESSION: LUE GMC/FMC training  Danelle Earthly, MS, OTR/L  Otis Dials, OT 04/01/2023, 2:41 PM

## 2023-04-03 ENCOUNTER — Ambulatory Visit: Payer: Medicare Other

## 2023-04-03 ENCOUNTER — Inpatient Hospital Stay: Payer: Medicare Other | Attending: Internal Medicine

## 2023-04-03 ENCOUNTER — Ambulatory Visit: Payer: Medicare Other | Admitting: Physical Therapy

## 2023-04-03 ENCOUNTER — Inpatient Hospital Stay: Payer: Medicare Other

## 2023-04-03 VITALS — BP 137/88 | HR 53

## 2023-04-03 DIAGNOSIS — D469 Myelodysplastic syndrome, unspecified: Secondary | ICD-10-CM

## 2023-04-03 DIAGNOSIS — D461 Refractory anemia with ring sideroblasts: Secondary | ICD-10-CM | POA: Diagnosis present

## 2023-04-03 LAB — HEMOGLOBIN AND HEMATOCRIT (CANCER CENTER ONLY)
HCT: 27.8 % — ABNORMAL LOW (ref 39.0–52.0)
Hemoglobin: 9.1 g/dL — ABNORMAL LOW (ref 13.0–17.0)

## 2023-04-03 MED ORDER — DARBEPOETIN ALFA 100 MCG/0.5ML IJ SOSY
100.0000 ug | PREFILLED_SYRINGE | Freq: Once | INTRAMUSCULAR | Status: AC
  Administered 2023-04-03: 100 ug via SUBCUTANEOUS
  Filled 2023-04-03: qty 0.5

## 2023-04-08 ENCOUNTER — Telehealth: Payer: Self-pay | Admitting: *Deleted

## 2023-04-08 ENCOUNTER — Ambulatory Visit: Payer: Medicare Other

## 2023-04-08 DIAGNOSIS — R262 Difficulty in walking, not elsewhere classified: Secondary | ICD-10-CM

## 2023-04-08 DIAGNOSIS — R2681 Unsteadiness on feet: Secondary | ICD-10-CM

## 2023-04-08 DIAGNOSIS — I69254 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: Secondary | ICD-10-CM

## 2023-04-08 DIAGNOSIS — R278 Other lack of coordination: Secondary | ICD-10-CM

## 2023-04-08 DIAGNOSIS — R2689 Other abnormalities of gait and mobility: Secondary | ICD-10-CM

## 2023-04-08 DIAGNOSIS — M6281 Muscle weakness (generalized): Secondary | ICD-10-CM

## 2023-04-08 NOTE — Therapy (Signed)
OUTPATIENT PHYSICAL THERAPY TREATMENT/RECERT  Patient Name: Vernon [name] MRN: [id number] DOB:1946/11/07, 77 y.o., male Today's Date: 04/09/2023   PCP:  Gracelyn Nurse, MD   REFERRING PROVIDER:  Gracelyn Nurse, MD    END OF SESSION:  PT End of Session - 04/08/23 1444     Visit Number 17    Number of Visits 41    Date for PT Re-Evaluation 07/01/23    Authorization Type [employer]    Progress Note Due on Visit 20    PT Start Time 1440    PT Stop Time 1514    PT Time Calculation (min) 34 min    Equipment Utilized During Treatment Gait belt    Activity Tolerance Patient tolerated treatment well    Behavior During Therapy WFL for tasks assessed/performed                Past Medical History:  Diagnosis Date   Anemia    Aortic stenosis    Arthritis    Complication of anesthesia    hard time getting bp up after knee replacement   Coronary artery disease    Diabetes mellitus without complication (HCC)    GERD (gastroesophageal reflux disease)    occ tums prn   History of hiatal hernia    Hypertension    MDS (myelodysplastic syndrome) (HCC)    Presence of Watchman left atrial appendage closure device 07/26/2022   27mm Watchman FLX with Dr. Lalla Brothers   Past Surgical History:  Procedure Laterality Date   CARPAL TUNNEL RELEASE  2012   CRANIOTOMY N/A 02/10/2022   Procedure: SUBOCCIPITAL CRANIECTOMY FOR EVACUATION OF CEREBELLAR HEMATOMA;  Surgeon: Barnett Abu, MD;  Location: [facility] OR;  Service: Neurosurgery;  Laterality: N/A;   JOINT REPLACEMENT Right 2010   LEFT ATRIAL APPENDAGE OCCLUSION N/A 07/26/2022   Procedure: LEFT ATRIAL APPENDAGE OCCLUSION;  Surgeon: Lanier Prude, MD;  Location: [facility] INVASIVE CV LAB;  Service: Cardiovascular;  Laterality: N/A;   SHOULDER ARTHROSCOPY WITH ROTATOR CUFF REPAIR AND OPEN BICEPS TENODESIS Right 11/09/2019   Procedure: RIGHT SHOULDER ARTHROSCOPY WITH SUBSCAPULARIS REPAIR, SUBACROMIAL DECOMPRESSION,MINI OPEN ROTATOR CUFF REPAIR;   Surgeon: Signa Kell, MD;  Location: [facility] ORS;  Service: Orthopedics;  Laterality: Right;   TEE WITHOUT CARDIOVERSION N/A 07/26/2022   Procedure: TRANSESOPHAGEAL ECHOCARDIOGRAM (TEE);  Surgeon: Lanier Prude, MD;  [facility];  Service: Cardiovascular;  Laterality: N/A;   Patient Active Problem List   Diagnosis Date Noted   Urinary tract infection without hematuria 12/19/2022   Painless jaundice 12/18/2022   Elevated LFTs 12/18/2022   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   Obstructive jaundice 12/17/2022   Atrial fibrillation (HCC) 07/26/2022   Presence of Watchman left atrial appendage closure device 07/26/2022   Proteinuria, unspecified 05/23/2022   Simple renal cyst 05/23/2022   Long term current use of anticoagulant 04/04/2022   Rotator cuff syndrome 04/04/2022   Exposure to potentially hazardous substance 04/04/2022   Gout 04/04/2022   Osteoarthritis 04/04/2022   Osteoporosis 04/04/2022   Other specified diseases of hair and hair follicles 04/04/2022   Pain in joint, lower leg 04/04/2022   Problem related to unspecified psychosocial circumstances 04/04/2022   General medical examination for administrative purposes 04/04/2022   Stage 3b chronic kidney disease (HCC)    Dysphagia, post-stroke    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   Cough 02/20/2022   Acute metabolic encephalopathy 02/20/2022   Obstructive hydrocephalus (HCC) 02/19/2022   Dyslipidemia 02/19/2022   Dysphagia 02/19/2022  AKI (acute kidney injury) (HCC)    Anemia    Pleural effusion on right    Respiratory failure requiring intubation (HCC)    Cerebral edema (HCC)    Chronic atrial fibrillation (HCC)    Controlled type 2 diabetes mellitus with hyperglycemia, without long-term current use of insulin (HCC)    ICH (intracerebral hemorrhage) (HCC) 02/10/2022   Intracranial hemorrhage (HCC)    Anticoagulated    Moderate tricuspid regurgitation 07/26/2021   Moderate aortic  valve stenosis 03/08/2020   MDS (myelodysplastic syndrome) (HCC) 02/18/2020   Macrocytic anemia 01/13/2020   Spleen enlargement 01/13/2020   Bilateral carotid artery stenosis 07/14/2018   Atrial fibrillation, chronic (HCC) 07/14/2018   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   Essential hypertension 08/16/2014   Hyperlipemia, mixed 08/16/2014   Renal insufficiency 08/16/2014    ONSET DATE: 02/10/2022  REFERRING DIAG:  I63.9 (ICD-10-CM) - Cerebral infarction, unspecified  I63.9 (ICD-10-CM) - CVA (cerebral vascular accident) (HCC)    THERAPY DIAG:  Muscle weakness (generalized) - Plan: PT plan of care cert/re-cert  Other lack of coordination - Plan: PT plan of care cert/re-cert  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) - Plan: PT plan of care cert/re-cert  Difficulty in walking, not elsewhere classified - Plan: PT plan of care cert/re-cert  Other abnormalities of gait and mobility - Plan: PT plan of care cert/re-cert  Unsteadiness on feet - Plan: PT plan of care cert/re-cert  Rationale for Evaluation and Treatment: Rehabilitation  SUBJECTIVE:                                                                                                                                                                                             SUBJECTIVE STATEMENT:  Patient reports fall on 03/24/2023- hurting left side- Did not seek medical attention that day but did see his Cardiologist        PERTINENT HISTORY:  Vernon [name] is a 76yoM 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 Pt hospitalized 1/22-1/25/24 for obstructive jaundice. Pt reports it was found he had a UTI with recent hospital stay. He wants to improve his endurance and balance. PMH significant for MDS, chronic a-fib, HTN, DMII, Presence of Watchman Left atrial appendage closure device, anemia,  arthritis, hx of hiatal hernia, hx craniotomy 02/10/22, L atrial appendage occlusion 07/26/22, TEE without cardioversion 07/26/2022.     PAIN:  Are you having pain? Yes, 2/10 Left knee resting, worse with standing, walking, terminal extension  PRECAUTIONS: Fall  WEIGHT BEARING RESTRICTIONS: No  FALLS: Has patient fallen in last  6 months? No  PATIENT GOALS: improve coordination, endurance and balance  OBJECTIVE:    INTERVENTION THIS DATE:     NMR:   Forward/retro step up/over 2 1/2 foam roller x 10 Side step up and over 1/2 foam roll x 15 reps  Tandem walking in // bars with minimal UE support as needed- down and back x 6  SLS- multiple attempts each LE- at best 8 sec either leg yet majority between 2-3 with mostly ant or lateral LOB Ambulation with 4WW (focusing on left UE control of walker) x 160 feet  (several bouts of regrouping as left UE would intermittently jerk walker to left)   Therex:  Standing in // bars- forward high knee march down and then retro steps back to start x3 Gait using upright rollator - 5 min - total over 850 feet- with 3# AW  PATIENT EDUCATION: Education details: exam findings, plan, testing Person educated: Patient and Spouse Education method: Explanation Education comprehension: verbalized understanding  HOME EXERCISE PROGRAM: To be initiated next 1-2 visits   GOALS: Goals reviewed with patient? Yes  SHORT TERM GOALS: Target date: 02/20/2023  Patient will be independent in home exercise program to improve strength/mobility for better functional independence with ADLs. Baseline: to be intiated; 02/13/2023= Patient reports no issues with current HEP.  Goal status: MET   LONG TERM GOALS: Target date: 07/01/2023   Patient will complete five times sit to stand test in < 10 seconds indicating an increased LE strength and improved balance. Baseline: 14.89 sec; 02/13/2023= 11.99 without UE Support; 5/13= 10.54 sec  Goal status: Progressing  2.   Patient will increase 10 meter walk test to >1.72m/s as to improve gait speed for better community ambulation and to reduce fall risk. Baseline:  0.82 m/s; 02/13/2023= 1.03 m/s with RW Goal status: MET  3.  Patient will increase Berg Balance score by > 6 points to demonstrate decreased fall risk during functional activities. Baseline: 39/56; 02/13/2023=41/56; 5/13- not tested secondary to time constraints- will test next session.  Goal status: Progressing  4.  Patient will increase six minute walk test distance to >1000 for progression to community ambulator and improve gait ability Baseline: 795'; 02/13/2023= 890 feet with RW and 2 brief standing rest breaks; 04/08/2023= 912 feet with 2 brief standing rest breaks- using RW Goal status: Progressing  5.  Patient will demonstrate improved function as evidenced by a score of 62 on FOTO measure for full participation in activities at home and in the community. Baseline: 59; 02/13/2023=62; 04/08/2023- Will reassess next visit - limited secondary to time constraints.  Goal status: Progressing  ASSESSMENT:  CLINICAL IMPRESSION: Patient was 10 min late but arrived in good spirits for recert visit. Overall, he has demonstrated steady progress-improving in 6 min walk and 5 x STS. Unable to test all measures due to late arrival. He is interested in continuing PT to continue to assist with LE strength and balance.  Patient's condition has the potential to improve in response to therapy. Maximum improvement is yet to be obtained. The anticipated improvement is attainable and reasonable in a generally predictable time.  Pt will continue to benefit from skilled PT services to address deficits and impairment identified in evaluation in order to maximize independence and safety in basic mobility required for performance of ADL, IADL, and leisure.     OBJECTIVE IMPAIRMENTS: Abnormal gait, decreased activity tolerance, decreased balance, decreased coordination,  decreased endurance, decreased knowledge of use of DME, decreased mobility, difficulty walking, decreased  strength, impaired UE functional use, improper body mechanics, postural dysfunction, and pain.   ACTIVITY LIMITATIONS: carrying, lifting, bending, standing, squatting, stairs, transfers, and locomotion level  PARTICIPATION LIMITATIONS: meal prep, cleaning, laundry, driving, shopping, community activity, and yard work  PERSONAL FACTORS: Age, Past/current experiences, Time since onset of injury/illness/exacerbation, and 3+ comorbidities: PMH significant for MDS, chronic a-fib, HTN, DMII, Presence of Watchman Left atrial appendage closure device, anemia, arthritis, hx of hiatal hernia, hx craniotomy 02/10/22, L atrial appendage occlusion 07/26/22, TEE without cardioversion 07/26/2022  are also affecting patient's functional outcome.   REHAB POTENTIAL: Good  CLINICAL DECISION MAKING: Evolving/moderate complexity  EVALUATION COMPLEXITY: Moderate  PLAN:  PT FREQUENCY: 1-2x/week  PT DURATION: 12 weeks  PLANNED INTERVENTIONS: Therapeutic exercises, Therapeutic activity, Neuromuscular re-education, Balance training, Gait training, Patient/Family education, Self Care, Joint mobilization, Stair training, Vestibular training, Canalith repositioning, Visual/preceptual remediation/compensation, Orthotic/Fit training, DME instructions, Electrical stimulation, Wheelchair mobility training, Spinal mobilization, Cryotherapy, Moist heat, Splintting, Taping, Ultrasound, Manual therapy, and Re-evaluation  PLAN FOR NEXT SESSION:  Continue with LE strengthening exercises and balance related tasks as tolerated given his left knee DJD limitations; Reassess balance via BERG next visit.   Louis Meckel, PT Physical Therapist - [facility]  5:06 PM 04/09/23    04/09/23, 5:06 PM

## 2023-04-08 NOTE — Telephone Encounter (Addendum)
Cardiac Catheterization scheduled at [facility] for: Wednesday Apr 09, 2024 11:30 AM Arrival time [facility] Main Entrance A at: 9 AM-will check BMP  Nothing to eat after midnight prior to procedure, clear liquids until 5 AM day of procedure.  Medication instructions: -Hold:  Lasix-day before and day of procedure-per protocol GFR 45  Patient knows okay to take Lasix if increase in weight/edema/shortness of breath.  Metformin-day of procedure and 48 hours post procedure -Other usual morning medications can be taken with sips of water including aspirin 81 mg.  Confirmed patient has responsible adult to drive home post procedure and be with patient first 24 hours after arriving home.  Plan to go home the same day, you will only stay overnight if medically necessary.  Reviewed procedure instructions with patient's wife (DPR).   Per Dr Flora Lipps would not recommend pre-procedure hydration-he already has CHF.

## 2023-04-10 ENCOUNTER — Other Ambulatory Visit: Payer: Self-pay

## 2023-04-10 ENCOUNTER — Ambulatory Visit: Payer: Medicare Other | Admitting: Cardiovascular Disease

## 2023-04-10 ENCOUNTER — Ambulatory Visit (HOSPITAL_COMMUNITY)
Admission: RE | Admit: 2023-04-10 | Discharge: 2023-04-10 | Disposition: A | Discharge status: Home / Self Care | Payer: Medicare Other | Attending: Cardiology | Admitting: Cardiology

## 2023-04-10 ENCOUNTER — Ambulatory Visit: Payer: Medicare Other | Admitting: Physical Therapy

## 2023-04-10 ENCOUNTER — Encounter (HOSPITAL_COMMUNITY)
Admission: RE | Disposition: A | Discharge status: Home / Self Care | Payer: Self-pay | Source: Home / Self Care | Attending: Cardiology

## 2023-04-10 DIAGNOSIS — I4821 Permanent atrial fibrillation: Secondary | ICD-10-CM | POA: Diagnosis not present

## 2023-04-10 DIAGNOSIS — R9439 Abnormal result of other cardiovascular function study: Secondary | ICD-10-CM | POA: Diagnosis not present

## 2023-04-10 DIAGNOSIS — Z7982 Long term (current) use of aspirin: Secondary | ICD-10-CM | POA: Insufficient documentation

## 2023-04-10 DIAGNOSIS — I130 Hypertensive heart and chronic kidney disease with heart failure and stage 1 through stage 4 chronic kidney disease, or unspecified chronic kidney disease: Secondary | ICD-10-CM | POA: Insufficient documentation

## 2023-04-10 DIAGNOSIS — N1831 Chronic kidney disease, stage 3a: Secondary | ICD-10-CM | POA: Insufficient documentation

## 2023-04-10 DIAGNOSIS — Z87891 Personal history of nicotine dependence: Secondary | ICD-10-CM | POA: Insufficient documentation

## 2023-04-10 DIAGNOSIS — R0602 Shortness of breath: Secondary | ICD-10-CM | POA: Insufficient documentation

## 2023-04-10 DIAGNOSIS — D6859 Other primary thrombophilia: Secondary | ICD-10-CM | POA: Diagnosis not present

## 2023-04-10 DIAGNOSIS — I5032 Chronic diastolic (congestive) heart failure: Secondary | ICD-10-CM

## 2023-04-10 DIAGNOSIS — E1122 Type 2 diabetes mellitus with diabetic chronic kidney disease: Secondary | ICD-10-CM | POA: Insufficient documentation

## 2023-04-10 DIAGNOSIS — E782 Mixed hyperlipidemia: Secondary | ICD-10-CM | POA: Diagnosis not present

## 2023-04-10 DIAGNOSIS — R931 Abnormal findings on diagnostic imaging of heart and coronary circulation: Secondary | ICD-10-CM

## 2023-04-10 DIAGNOSIS — Z7984 Long term (current) use of oral hypoglycemic drugs: Secondary | ICD-10-CM | POA: Insufficient documentation

## 2023-04-10 DIAGNOSIS — I350 Nonrheumatic aortic (valve) stenosis: Secondary | ICD-10-CM

## 2023-04-10 DIAGNOSIS — I2510 Atherosclerotic heart disease of native coronary artery without angina pectoris: Secondary | ICD-10-CM

## 2023-04-10 DIAGNOSIS — N1832 Chronic kidney disease, stage 3b: Secondary | ICD-10-CM

## 2023-04-10 DIAGNOSIS — I4811 Longstanding persistent atrial fibrillation: Secondary | ICD-10-CM

## 2023-04-10 HISTORY — PX: RIGHT/LEFT HEART CATH AND CORONARY ANGIOGRAPHY: CATH118266

## 2023-04-10 LAB — GLUCOSE, CAPILLARY
Glucose-Capillary: 136 mg/dL — ABNORMAL HIGH (ref 70–99)
Glucose-Capillary: 123 mg/dL — ABNORMAL HIGH (ref 70–99)

## 2023-04-10 LAB — POCT I-STAT EG7
Acid-base deficit: 1 mmol/L (ref 0.0–2.0)
Acid-base deficit: 2 mmol/L (ref 0.0–2.0)
Bicarbonate: 22.6 mmol/L (ref 20.0–28.0)
Bicarbonate: 22.2 mmol/L (ref 20.0–28.0)
Calcium, Ion: 1.27 mmol/L (ref 1.15–1.40)
Calcium, Ion: 1.21 mmol/L (ref 1.15–1.40)
HCT: 27 % — ABNORMAL LOW (ref 39.0–52.0)
HCT: 26 % — ABNORMAL LOW (ref 39.0–52.0)
Hemoglobin: 9.2 g/dL — ABNORMAL LOW (ref 13.0–17.0)
Hemoglobin: 8.8 g/dL — ABNORMAL LOW (ref 13.0–17.0)
O2 Saturation: 63 %
O2 Saturation: 60 %
Potassium: 4.1 mmol/L (ref 3.5–5.1)
Potassium: 4 mmol/L (ref 3.5–5.1)
Sodium: 141 mmol/L (ref 135–145)
Sodium: 142 mmol/L (ref 135–145)
TCO2: 24 mmol/L (ref 22–32)
TCO2: 23 mmol/L (ref 22–32)
pCO2, Ven: 34.6 mmHg — ABNORMAL LOW (ref 44–60)
pCO2, Ven: 34 mmHg — ABNORMAL LOW (ref 44–60)
pH, Ven: 7.424 (ref 7.25–7.43)
pH, Ven: 7.423 (ref 7.25–7.43)
pO2, Ven: 31 mmHg — CL (ref 32–45)
pO2, Ven: 30 mmHg — CL (ref 32–45)

## 2023-04-10 LAB — POCT I-STAT 7, (LYTES, BLD GAS, ICA,H+H)
Acid-base deficit: 2 mmol/L (ref 0.0–2.0)
Bicarbonate: 21.1 mmol/L (ref 20.0–28.0)
Calcium, Ion: 1.27 mmol/L (ref 1.15–1.40)
HCT: 28 % — ABNORMAL LOW (ref 39.0–52.0)
Hemoglobin: 9.5 g/dL — ABNORMAL LOW (ref 13.0–17.0)
O2 Saturation: 97 %
Potassium: 4.4 mmol/L (ref 3.5–5.1)
Sodium: 141 mmol/L (ref 135–145)
TCO2: 22 mmol/L (ref 22–32)
pCO2 arterial: 28.4 mmHg — ABNORMAL LOW (ref 32–48)
pH, Arterial: 7.478 — ABNORMAL HIGH (ref 7.35–7.45)
pO2, Arterial: 86 mmHg (ref 83–108)

## 2023-04-10 LAB — POCT ACTIVATED CLOTTING TIME
Activated Clotting Time: 179 seconds
Activated Clotting Time: 168 seconds

## 2023-04-10 SURGERY — RIGHT/LEFT HEART CATH AND CORONARY ANGIOGRAPHY
Anesthesia: LOCAL

## 2023-04-10 MED ORDER — ONDANSETRON HCL 4 MG/2ML IJ SOLN: 4.0000 mg | Freq: Four times a day (QID) | INTRAMUSCULAR | Status: DC | PRN

## 2023-04-10 MED ORDER — SODIUM CHLORIDE 0.9 % IV SOLN: 250.0000 mL | INTRAVENOUS | Status: DC | PRN

## 2023-04-10 MED ORDER — SODIUM CHLORIDE 0.9% FLUSH: 3.0000 mL | INTRAVENOUS | Status: DC | PRN

## 2023-04-10 MED ORDER — HEPARIN SODIUM (PORCINE) 1000 UNIT/ML IJ SOLN
INTRAMUSCULAR | Status: AC
  Filled 2023-04-10: qty 10

## 2023-04-10 MED ORDER — SODIUM CHLORIDE 0.9% FLUSH: 3.0000 mL | Freq: Two times a day (BID) | INTRAVENOUS | Status: DC

## 2023-04-10 MED ORDER — LIDOCAINE HCL (PF) 1 % IJ SOLN
INTRAMUSCULAR | Status: DC | PRN
  Administered 2023-04-10 (×2): 2 mL
  Administered 2023-04-10: 10 mL

## 2023-04-10 MED ORDER — SODIUM CHLORIDE 0.9 % WEIGHT BASED INFUSION: 1.0000 mL/kg/h | INTRAVENOUS | Status: DC

## 2023-04-10 MED ORDER — HEPARIN SODIUM (PORCINE) 1000 UNIT/ML IJ SOLN
INTRAMUSCULAR | Status: DC | PRN
  Administered 2023-04-10: 4500 [IU] via INTRAVENOUS

## 2023-04-10 MED ORDER — IOHEXOL 350 MG/ML SOLN
INTRAVENOUS | Status: DC | PRN
  Administered 2023-04-10: 70 mL

## 2023-04-10 MED ORDER — LABETALOL HCL 5 MG/ML IV SOLN: 10.0000 mg | INTRAVENOUS | Status: DC | PRN

## 2023-04-10 MED ORDER — LIDOCAINE HCL (PF) 1 % IJ SOLN
INTRAMUSCULAR | Status: AC
  Filled 2023-04-10: qty 30

## 2023-04-10 MED ORDER — ASPIRIN 81 MG PO CHEW: 81.0000 mg | CHEWABLE_TABLET | ORAL | Status: DC

## 2023-04-10 MED ORDER — SODIUM CHLORIDE 0.9 % IV SOLN: INTRAVENOUS | Status: DC

## 2023-04-10 MED ORDER — VERAPAMIL HCL 2.5 MG/ML IV SOLN
INTRAVENOUS | Status: AC
  Filled 2023-04-10: qty 2

## 2023-04-10 MED ORDER — HEPARIN (PORCINE) IN NACL 1000-0.9 UT/500ML-% IV SOLN
INTRAVENOUS | Status: DC | PRN
  Administered 2023-04-10 (×2): 500 mL

## 2023-04-10 MED ORDER — SODIUM CHLORIDE 0.9 % IV SOLN
INTRAVENOUS | Status: DC | PRN
  Administered 2023-04-10: 85 mL/h via INTRAVENOUS

## 2023-04-10 MED ORDER — ACETAMINOPHEN 325 MG PO TABS: 650.0000 mg | ORAL_TABLET | ORAL | Status: DC | PRN

## 2023-04-10 MED ORDER — VERAPAMIL HCL 2.5 MG/ML IV SOLN
INTRAVENOUS | Status: DC | PRN
  Administered 2023-04-10 (×2): 10 mL via INTRA_ARTERIAL

## 2023-04-10 MED ORDER — HYDRALAZINE HCL 20 MG/ML IJ SOLN: 10.0000 mg | INTRAMUSCULAR | Status: DC | PRN

## 2023-04-10 SURGICAL SUPPLY — 22 items
CATH BALLN WEDGE 5F 110CM (CATHETERS) IMPLANT
CATH INFINITI 5FR JL5 (CATHETERS) IMPLANT
CATH INFINITI 5FR MULTPACK ANG (CATHETERS) IMPLANT
CATH INFINITI JR4 5F (CATHETERS) IMPLANT
CATH INFINITI MULTIPACK ANG 4F (CATHETERS) IMPLANT
CATH OPTITORQUE TIG 4.0 5F (CATHETERS) IMPLANT
DEVICE RAD COMP TR BAND LRG (VASCULAR PRODUCTS) IMPLANT
GLIDESHEATH SLEND SS 6F .021 (SHEATH) IMPLANT
GUIDEWIRE ANGLED .035X150CM (WIRE) IMPLANT
GUIDEWIRE INQWIRE 1.5J.035X260 (WIRE) IMPLANT
INQWIRE 1.5J .035X260CM (WIRE) ×1
KIT HEART LEFT (KITS) ×1 IMPLANT
KIT MICROPUNCTURE NIT STIFF (SHEATH) IMPLANT
PACK CARDIAC CATHETERIZATION (CUSTOM PROCEDURE TRAY) ×1 IMPLANT
SHEATH GLIDE SLENDER 4/5FR (SHEATH) IMPLANT
SHEATH PINNACLE 5F 10CM (SHEATH) IMPLANT
SHEATH PROBE COVER 6X72 (BAG) IMPLANT
TRANSDUCER W/STOPCOCK (MISCELLANEOUS) ×1 IMPLANT
TUBING CIL FLEX 10 FLL-RA (TUBING) ×1 IMPLANT
WIRE EMERALD 3MM-J .025X260CM (WIRE) IMPLANT
WIRE EMERALD 3MM-J .035X150CM (WIRE) IMPLANT
WIRE HI TORQ VERSACORE-J 145CM (WIRE) IMPLANT

## 2023-04-10 NOTE — Interval H&P Note (Signed)
History and Physical Interval Note:  04/10/2023 1:05 PM  Vernon [name]  has presented today for surgery, with the diagnosis of abnormal nuc PET [facility]  The various methods of treatment have been discussed with the patient and family. After consideration of risks, benefits and other options for treatment, the patient has consented to  Procedure(s): RIGHT/LEFT HEART CATH AND CORONARY ANGIOGRAPHY (N/A)  PERCUTANEOUS CORONARY INTERVENTION   as a surgical intervention.  The patient's history has been reviewed, patient examined, no change in status, stable for surgery.  I have reviewed the patient's chart and labs.  Questions were answered to the patient's satisfaction.    Cath Lab Visit (complete for each Cath Lab visit)  Clinical Evaluation Leading to the Procedure:   ACS: No.  Non-ACS:    Anginal Classification: No Symptoms  Anti-ischemic medical therapy: Minimal Therapy (1 class of medications)  Non-Invasive Test Results: Intermediate-risk stress test findings: cardiac mortality 1-3%/year  Prior CABG: No previous CABG    Bryan Lemma

## 2023-04-10 NOTE — Progress Notes (Signed)
15F arterial sheath from right groin removed.  Manual pressure held x 25 minutes.  Right groin site level 0 pre and post pull.  Right DP pulse palpable post sheath pull.  Bedrest started at 1535.  Gauze and tegaderm applied to right groin.  Instructions reviewed with patient.

## 2023-04-10 NOTE — Progress Notes (Signed)
TR BAND REMOVAL  LOCATION:    right radial  DEFLATED PER PROTOCOL:    Yes.    TIME BAND OFF / DRESSING APPLIED: 04/10/23 at 1700   SITE UPON ARRIVAL:    Level 0  SITE AFTER BAND REMOVAL:    Level 0  CIRCULATION SENSATION AND MOVEMENT:    Within Normal Limits   Yes.    COMMENTS:

## 2023-04-11 ENCOUNTER — Telehealth: Payer: Self-pay | Admitting: Cardiology

## 2023-04-11 ENCOUNTER — Encounter (HOSPITAL_COMMUNITY): Payer: Self-pay | Admitting: Cardiology

## 2023-04-11 NOTE — Telephone Encounter (Signed)
Pt called back per message received in [facility] Triage.    Pt stated his wife already spoke to Dr. Lacretia Nicks. O'Neal's nurse regarding labs next week.  Since Dr. Marylene Buerger RN is involved in communication, Pt advised to give his nurse time to follow up with Lab draw communication.  If no call is received by Monday, 5/20, Pt advised to call [facility] on [address].   Pt has 2 week follow up appointment s/p Heart Cath, 5/29 at 1100 am scheduled with Dr. Flora Lipps;  Pt was advised of this appointment, and that we are always available to answer questions.   Pt appreciated the telephone call.

## 2023-04-11 NOTE — Telephone Encounter (Signed)
Patient's spouse calling in regards to the patient having lab work completed. Patient's spouse was told that the patient may or may not need to get lab work, but has not heard back yet if he does. Patient's spouse also mentioned that their is scheduling conflict with his upcoming appt on May 29th and would like to know if they can move it to a different time that day. Please advise.

## 2023-04-12 ENCOUNTER — Other Ambulatory Visit: Payer: Self-pay

## 2023-04-12 DIAGNOSIS — I4821 Permanent atrial fibrillation: Secondary | ICD-10-CM

## 2023-04-15 ENCOUNTER — Other Ambulatory Visit: Payer: Self-pay

## 2023-04-15 ENCOUNTER — Ambulatory Visit: Payer: Medicare Other | Admitting: Physical Therapy

## 2023-04-15 DIAGNOSIS — I4821 Permanent atrial fibrillation: Secondary | ICD-10-CM

## 2023-04-16 LAB — BASIC METABOLIC PANEL
BUN/Creatinine Ratio: 17 (ref 10–24)
Creatinine, Ser: 1.62 mg/dL — ABNORMAL HIGH (ref 0.76–1.27)

## 2023-04-16 LAB — CBC

## 2023-04-17 ENCOUNTER — Inpatient Hospital Stay: Payer: Medicare Other

## 2023-04-17 ENCOUNTER — Encounter: Payer: Medicare Other | Admitting: Occupational Therapy

## 2023-04-17 ENCOUNTER — Ambulatory Visit: Payer: Medicare Other

## 2023-04-17 VITALS — BP 129/58

## 2023-04-17 DIAGNOSIS — D461 Refractory anemia with ring sideroblasts: Secondary | ICD-10-CM | POA: Diagnosis not present

## 2023-04-17 DIAGNOSIS — D469 Myelodysplastic syndrome, unspecified: Secondary | ICD-10-CM

## 2023-04-17 LAB — HEMOGLOBIN AND HEMATOCRIT (CANCER CENTER ONLY)
HCT: 26.8 % — ABNORMAL LOW (ref 39.0–52.0)
Hemoglobin: 8.8 g/dL — ABNORMAL LOW (ref 13.0–17.0)

## 2023-04-17 MED ORDER — DARBEPOETIN ALFA 100 MCG/0.5ML IJ SOSY
100.0000 ug | PREFILLED_SYRINGE | Freq: Once | INTRAMUSCULAR | Status: AC
  Administered 2023-04-17: 100 ug via SUBCUTANEOUS
  Filled 2023-04-17: qty 0.5

## 2023-04-18 LAB — BASIC METABOLIC PANEL
BUN/Creatinine Ratio: 17 (ref 10–24)
CO2: 22 mmol/L (ref 20–29)
Calcium: 9.5 mg/dL (ref 8.6–10.2)
Chloride: 103 mmol/L (ref 96–106)
Creatinine, Ser: 1.62 mg/dL — ABNORMAL HIGH (ref 0.76–1.27)
Glucose: 118 mg/dL — ABNORMAL HIGH (ref 70–99)
Potassium: 5 mmol/L (ref 3.5–5.2)
Sodium: 140 mmol/L (ref 134–144)

## 2023-04-18 LAB — CBC
MCH: 30.9 pg (ref 26.6–33.0)
MCHC: 32.9 g/dL (ref 31.5–35.7)
NRBC: 1 % — ABNORMAL HIGH (ref 0–0)
Platelets: 254 10*3/uL (ref 150–450)
RDW: 26.4 % — ABNORMAL HIGH (ref 11.6–15.4)
WBC: 4.4 10*3/uL (ref 3.4–10.8)

## 2023-04-22 NOTE — Progress Notes (Unsigned)
Cardiology Office Note:   Date:  04/24/2023  NAME:  Vernon [name]    MRN: [id number] DOB:  1946/11/19   PCP:  Gracelyn Nurse, MD  Cardiologist:  None  Electrophysiologist:  Lanier Prude, MD   Referring MD: Gracelyn Nurse, MD   Chief Complaint  Patient presents with   Follow-up         History of Present Illness:   Vernon [name] is a 77 y.o. male with a hx of permanent Afib, moderate AS, HFpEF, pHTN who presents for follow-up.  Doing well after left and right heart catheterization.  Slightly volume up.  We discussed taking Lasix daily.  EKG shows A-fib with SVR.  No dizziness or lightheadedness.  No trouble breathing.  No significant CAD.  Aortic valve is moderate.  Overall doing well.  He is on Zetia.  Plans to have repeat labs with primary care physician in July.  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 Permanent Afib -s/p Watchman 06/2022 3. CAD -CAC 5415 (97th percentile)  -Cardiac PET 02/26/2023: normal perfusion, MBFR 1.41 -> Microvascular disease  -LHC: 04/10/2023, 40% pRCA, 40% mid LAD, 20% mLCX  4. Moderate to severe aortic stenosis  -MG 20 mmHG, AVA 0.8 cm2, DI 0.29 5. HTN 6. HLD -T chol 187, HDL 25, LDL 136, TG 128 7. MDS 8. Anemia 9. CKD IIIa 10. DM -A1c 5.7 11. HFpEF -50-55%  Past Medical History: Past Medical History:  Diagnosis Date   Anemia    Aortic stenosis    Arthritis    Complication of anesthesia    hard time getting bp up after knee replacement   Coronary artery disease    Diabetes mellitus without complication (HCC)    GERD (gastroesophageal reflux disease)    occ tums prn   History of hiatal hernia    Hypertension    MDS (myelodysplastic syndrome) (HCC)    Presence of Watchman left atrial appendage closure device 07/26/2022   27mm Watchman FLX with Dr. Lalla Brothers    Past Surgical History: Past Surgical History:  Procedure Laterality Date   CARPAL TUNNEL RELEASE  2012   CRANIOTOMY N/A 02/10/2022   Procedure:  SUBOCCIPITAL CRANIECTOMY FOR EVACUATION OF CEREBELLAR HEMATOMA;  Surgeon: Barnett Abu, MD;  Location: [facility];  Service: Neurosurgery;  Laterality: N/A;   JOINT REPLACEMENT Right 2010   LEFT ATRIAL APPENDAGE OCCLUSION N/A 07/26/2022   Procedure: LEFT ATRIAL APPENDAGE OCCLUSION;  Surgeon: Lanier Prude, MD;  Location: [facility] INVASIVE CV LAB;  Service: Cardiovascular;  Laterality: N/A;   RIGHT/LEFT HEART CATH AND CORONARY ANGIOGRAPHY N/A 04/10/2023   Procedure: RIGHT/LEFT HEART CATH AND CORONARY ANGIOGRAPHY;  Surgeon: Marykay Lex, MD;  Location: [facility] INVASIVE CV LAB;  Service: Cardiovascular;  Laterality: N/A;   SHOULDER ARTHROSCOPY WITH ROTATOR CUFF REPAIR AND OPEN BICEPS TENODESIS Right 11/09/2019   Procedure: RIGHT SHOULDER ARTHROSCOPY WITH SUBSCAPULARIS REPAIR, SUBACROMIAL DECOMPRESSION,MINI OPEN ROTATOR CUFF REPAIR;  Surgeon: Signa Kell, MD;  Location: [facility] ORS;  Service: Orthopedics;  Laterality: Right;   TEE WITHOUT CARDIOVERSION N/A 07/26/2022   Procedure: TRANSESOPHAGEAL ECHOCARDIOGRAM (TEE);  Surgeon: Lanier Prude, MD;  Location: [facility] INVASIVE CV LAB;  Service: Cardiovascular;  Laterality: N/A;    Current Medications: Current Meds  Medication Sig   aspirin EC 81 MG tablet Take 1 tablet (81 mg total) by mouth daily. Swallow whole.   ezetimibe (ZETIA) 10 MG tablet Take 1 tablet (10 mg total) by mouth daily.   metFORMIN (GLUCOPHAGE) 500 MG  tablet Take 500 mg by mouth 2 (two) times daily with a meal.   Multiple Vitamin (MULTI-VITAMIN) tablet Take 1 tablet by mouth daily.   [DISCONTINUED] furosemide (LASIX) 20 MG tablet Take 10 mg by mouth daily as needed for fluid or edema.     Allergies:    Patient has no known allergies.   Social History: Social History   Socioeconomic History   Marital status: Married    Spouse name: Not on file   Number of children: 2   Years of education: Not on file   Highest education level: Not on file  Occupational History   Occupation: Retired  - [employer]  Tobacco Use   Smoking status: Former    Packs/day: 0.50    Years: 8.00    Additional pack years: 0.00    Total pack years: 4.00    Types: Cigarettes    Quit date: 07/21/1978    Years since quitting: 44.7   Smokeless tobacco: Never  Vaping Use   Vaping Use: Never used  Substance and Sexual Activity   Alcohol use: Yes    Alcohol/week: 0.0 - 1.0 standard drinks of alcohol    Comment: rare beer   Drug use: Never   Sexual activity: Not on file  Other Topics Concern   Not on file  Social History Narrative   Lives in [place]; with wife; quit smoking in early 45s; ocassional/ rare [may be 1 a month beer]. 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    Social Determinants of Health   Financial Resource Strain: Not on file  Food Insecurity: No Food Insecurity (12/18/2022)   Hunger Vital Sign    Worried About Running Out of Food in the Last Year: Never true    Ran Out of Food in the Last Year: Never true  Transportation Needs: No Transportation Needs (12/18/2022)   PRAPARE - Administrator, Civil Service (Medical): No    Lack of Transportation (Non-Medical): No  Physical Activity: Not on file  Stress: Not on file  Social Connections: Not on file     Family History: The patient's family history includes Cancer in his maternal uncle.  ROS:   All other ROS reviewed and negative. Pertinent positives noted in the HPI.     EKGs/Labs/Other Studies Reviewed:   The following studies were personally reviewed by me today:  EKG:  EKG is ordered today.  The ekg ordered today demonstrates A-fib heart rate 46, nonspecific ST-T changes, and was personally reviewed by me.   Recent Labs: 12/19/2022: Magnesium 1.6 01/14/2023: BNP 341.9 03/25/2023: ALT 15 04/15/2023: BUN 28; Creatinine, Ser 1.62; Platelets 254; Potassium 5.0; Sodium 140 04/17/2023: Hemoglobin 8.8   Recent Lipid Panel    Component Value Date/Time   CHOL 187 12/19/2022 1029   TRIG 128 12/19/2022 1029    HDL 25 (L) 12/19/2022 1029   CHOLHDL 7.5 12/19/2022 1029   VLDL 26 12/19/2022 1029   LDLCALC 136 (H) 12/19/2022 1029    Physical Exam:   VS:  BP 128/82   Pulse (!) 46   Ht 5\' 9"  (1.753 m)   Wt 198 lb (89.8 kg)   SpO2 99%   BMI 29.24 kg/m    Wt Readings from Last 3 Encounters:  04/24/23 198 lb (89.8 kg)  04/10/23 190 lb (86.2 kg)  03/25/23 191 lb (86.6 kg)    General: Well nourished, well developed, in no acute distress Head: Atraumatic, normal size  Eyes: PEERLA, EOMI  Neck: Supple, no  JVD Endocrine: No thryomegaly Cardiac: Normal S1, S2; RRR; 2 out of 6 systolic ejection murmur, S2 is heard Lungs: Clear to auscultation bilaterally, no wheezing, rhonchi or rales  Abd: Soft, nontender, no hepatomegaly  Ext: 1+ pitting edema Musculoskeletal: No deformities, BUE and BLE strength normal and equal Skin: Warm and dry, no rashes   Neuro: Alert and oriented to person, place, time, and situation, CNII-XII grossly intact, no focal deficits  Psych: Normal mood and affect   ASSESSMENT:   Vernon [name] is a 77 y.o. male who presents for the following: 1. Permanent atrial fibrillation (HCC)   2. Longstanding persistent atrial fibrillation (HCC)   3. Moderate aortic valve stenosis   4. Coronary artery disease involving native coronary artery of native heart without angina pectoris   5. Agatston coronary artery calcium score greater than 400   6. Mixed hyperlipidemia   7. Chronic diastolic heart failure (HCC)   8. Pulmonary hypertension, unspecified (HCC)     PLAN:   1. Permanent atrial fibrillation (HCC) 2. Longstanding persistent atrial fibrillation (HCC) -A-fib with SVR.  No symptoms.  Status post watchman.  On aspirin due to CAD.  No significant bruising or bleeding.  No indications for pacing.  We will see him back in 6 months to discuss further.  3. Moderate aortic valve stenosis -I do hear and S2.  Aortic valve is likely moderate.  He will see me back in  4. Coronary  artery disease involving native coronary artery of native heart without angina pectoris 5. Agatston coronary artery calcium score greater than 400 6. Mixed hyperlipidemia -Nonobstructive CAD.  On aspirin 81 mg daily.  Not wanting to take a statin.  On Zetia 10 mg daily.  He will have repeat labs in July.  Goal LDL cholesterol less than 55.  We did discuss that if he cannot get to goal he will need to be considered for PCSK9 inhibitor therapy versus reinitiation of statin therapy.  He will think about this.  7. Chronic diastolic heart failure (HCC) 8. Pulmonary hypertension, unspecified (HCC) -Wedge pressure slightly elevated cath.  Slightly volume up today.  He will continue with Lasix 10 mg daily.  He was taking as needed.  I would like for him to take it daily.  He may take an additional 10 mg in the afternoon if needed.  We discussed [place].  He would like to hold on this.  BP is stable.  Given kidney function we will hold on Aldactone.      Disposition: Return in about 6 months (around 10/25/2023).  Medication Adjustments/Labs and Tests Ordered: Current medicines are reviewed at length with the patient today.  Concerns regarding medicines are outlined above.  Orders Placed This Encounter  Procedures   EKG 12-Lead   Meds ordered this encounter  Medications   furosemide (LASIX) 20 MG tablet    Sig: Take 0.5 tablets (10 mg total) by mouth daily. May take extra tablet if needed    Dispense:  30 tablet    Refill:  3    Patient Instructions  Medication Instructions:  Take Lasix 10 mg daily, extra tablet if needed   *If you need a refill on your cardiac medications before your next appointment, please call your pharmacy*  Follow-Up: At [facility], you and your health needs are our priority.  As part of our continuing mission to provide you with exceptional heart care, we have created designated Provider Care Teams.  These Care Teams include your primary  Cardiologist  (physician) and Advanced Practice Providers (APPs -  Physician Assistants and Nurse Practitioners) who all work together to provide you with the care you need, when you need it.  We recommend signing up for the patient portal called "MyChart".  Sign up information is provided on this After Visit Summary.  MyChart is used to connect with patients for Virtual Visits (Telemedicine).  Patients are able to view lab/test results, encounter notes, upcoming appointments, etc.  Non-urgent messages can be sent to your provider as well.   To learn more about what you can do with MyChart, go to [email or web address].    Your next appointment:   6 month(s)  Provider:   Lennie Odor, MD     Time Spent with Patient: I have spent a total of 35 minutes with patient reviewing hospital notes, telemetry, EKGs, labs and examining the patient as well as establishing an assessment and plan that was discussed with the patient.  > 50% of time was spent in direct patient care.  Signed, Lenna Gilford. Flora Lipps, MD, [facility]  [facility]  [address] [place], [place] [address] [phone]  04/24/2023 12:15 PM

## 2023-04-24 ENCOUNTER — Ambulatory Visit: Payer: Medicare Other

## 2023-04-24 ENCOUNTER — Encounter: Payer: Self-pay | Admitting: Cardiovascular Disease

## 2023-04-24 ENCOUNTER — Encounter: Payer: Medicare Other | Admitting: Occupational Therapy

## 2023-04-24 ENCOUNTER — Ambulatory Visit: Payer: Medicare Other | Attending: Cardiovascular Disease | Admitting: Cardiovascular Disease

## 2023-04-24 VITALS — BP 128/82 | HR 46 | Ht 69.0 in | Wt 198.0 lb

## 2023-04-24 DIAGNOSIS — I2510 Atherosclerotic heart disease of native coronary artery without angina pectoris: Secondary | ICD-10-CM | POA: Diagnosis not present

## 2023-04-24 DIAGNOSIS — E782 Mixed hyperlipidemia: Secondary | ICD-10-CM

## 2023-04-24 DIAGNOSIS — I4821 Permanent atrial fibrillation: Secondary | ICD-10-CM | POA: Diagnosis not present

## 2023-04-24 DIAGNOSIS — I69254 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: Secondary | ICD-10-CM

## 2023-04-24 DIAGNOSIS — I272 Pulmonary hypertension, unspecified: Secondary | ICD-10-CM

## 2023-04-24 DIAGNOSIS — R931 Abnormal findings on diagnostic imaging of heart and coronary circulation: Secondary | ICD-10-CM

## 2023-04-24 DIAGNOSIS — I350 Nonrheumatic aortic (valve) stenosis: Secondary | ICD-10-CM

## 2023-04-24 DIAGNOSIS — I4811 Longstanding persistent atrial fibrillation: Secondary | ICD-10-CM

## 2023-04-24 DIAGNOSIS — R2689 Other abnormalities of gait and mobility: Secondary | ICD-10-CM

## 2023-04-24 DIAGNOSIS — R2681 Unsteadiness on feet: Secondary | ICD-10-CM

## 2023-04-24 DIAGNOSIS — M6281 Muscle weakness (generalized): Secondary | ICD-10-CM

## 2023-04-24 DIAGNOSIS — R262 Difficulty in walking, not elsewhere classified: Secondary | ICD-10-CM

## 2023-04-24 DIAGNOSIS — I5032 Chronic diastolic (congestive) heart failure: Secondary | ICD-10-CM

## 2023-04-24 DIAGNOSIS — R278 Other lack of coordination: Secondary | ICD-10-CM

## 2023-04-24 MED ORDER — FUROSEMIDE 20 MG PO TABS: 10.0000 mg | ORAL_TABLET | Freq: Every day | ORAL | 3 refills | Status: DC

## 2023-04-24 NOTE — Therapy (Signed)
Patient Name: Vernon [name] MRN: [id number] DOB:04-01-1946, 77 y.o., male Today's Date: 04/24/2023  Non-billable visit today- Patient arrived and reported he was coming from his MD office for f/u for a catheterization. He states the MD "poked and prodded" and now complaining of increased overall LE Soreness. Discussed participating in PT and Patient was agreeable to cancelling treatment and rest today and return as planned next Monday 04/29/2023   Lenda Kelp, PT 04/24/2023, 2:10 PM

## 2023-04-24 NOTE — Patient Instructions (Signed)
Medication Instructions:  Take Lasix 10 mg daily, extra tablet if needed   *If you need a refill on your cardiac medications before your next appointment, please call your pharmacy*  Follow-Up: At [facility], you and your health needs are our priority.  As part of our continuing mission to provide you with exceptional heart care, we have created designated Provider Care Teams.  These Care Teams include your primary Cardiologist (physician) and Advanced Practice Providers (APPs -  Physician Assistants and Nurse Practitioners) who all work together to provide you with the care you need, when you need it.  We recommend signing up for the patient portal called "MyChart".  Sign up information is provided on this After Visit Summary.  MyChart is used to connect with patients for Virtual Visits (Telemedicine).  Patients are able to view lab/test results, encounter notes, upcoming appointments, etc.  Non-urgent messages can be sent to your provider as well.   To learn more about what you can do with MyChart, go to [email or web address].    Your next appointment:   6 month(s)  Provider:   Lennie Odor, MD

## 2023-04-29 ENCOUNTER — Ambulatory Visit: Payer: Medicare Other | Attending: Internal Medicine

## 2023-04-29 DIAGNOSIS — R2681 Unsteadiness on feet: Secondary | ICD-10-CM | POA: Diagnosis present

## 2023-04-29 DIAGNOSIS — M6281 Muscle weakness (generalized): Secondary | ICD-10-CM | POA: Insufficient documentation

## 2023-04-29 DIAGNOSIS — R262 Difficulty in walking, not elsewhere classified: Secondary | ICD-10-CM | POA: Insufficient documentation

## 2023-04-29 DIAGNOSIS — R2689 Other abnormalities of gait and mobility: Secondary | ICD-10-CM | POA: Insufficient documentation

## 2023-04-29 DIAGNOSIS — R278 Other lack of coordination: Secondary | ICD-10-CM | POA: Diagnosis present

## 2023-04-29 DIAGNOSIS — I69254 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: Secondary | ICD-10-CM | POA: Diagnosis present

## 2023-04-29 NOTE — Therapy (Signed)
OUTPATIENT PHYSICAL THERAPY TREATMENT  Patient Name: Vernon [name] MRN: [id number] DOB:09-Mar-1946, 77 y.o., male Today's Date: 04/30/2023   PCP:  Gracelyn Nurse, MD   REFERRING PROVIDER:  Gracelyn Nurse, MD    END OF SESSION:  PT End of Session - 04/29/23 1438     Visit Number 18    Number of Visits 41    Date for PT Re-Evaluation 07/01/23    Authorization Type U[employer] Medicare    Progress Note Due on Visit 20    PT Start Time 1435    PT Stop Time 1515    PT Time Calculation (min) 40 min    Equipment Utilized During Treatment Gait belt    Activity Tolerance Patient tolerated treatment well    Behavior During Therapy WFL for tasks assessed/performed                Past Medical History:  Diagnosis Date   Anemia    Aortic stenosis    Arthritis    Complication of anesthesia    hard time getting bp up after knee replacement   Coronary artery disease    Diabetes mellitus without complication (HCC)    GERD (gastroesophageal reflux disease)    occ tums prn   History of hiatal hernia    Hypertension    MDS (myelodysplastic syndrome) (HCC)    Presence of Watchman left atrial appendage closure device 07/26/2022   27mm Watchman FLX with Dr. Lalla Brothers   Past Surgical History:  Procedure Laterality Date   CARPAL TUNNEL RELEASE  2012   CRANIOTOMY N/A 02/10/2022   Procedure: SUBOCCIPITAL CRANIECTOMY FOR EVACUATION OF CEREBELLAR HEMATOMA;  Surgeon: Barnett Abu, MD;  Location: [facility] OR;  Service: Neurosurgery;  Laterality: N/A;   JOINT REPLACEMENT Right 2010   LEFT ATRIAL APPENDAGE OCCLUSION N/A 07/26/2022   Procedure: LEFT ATRIAL APPENDAGE OCCLUSION;  Surgeon: Lanier Prude, MD;  Location: [facility] INVASIVE CV LAB;  Service: Cardiovascular;  Laterality: N/A;   RIGHT/LEFT HEART CATH AND CORONARY ANGIOGRAPHY N/A 04/10/2023   Procedure: RIGHT/LEFT HEART CATH AND CORONARY ANGIOGRAPHY;  Surgeon: Marykay Lex, MD;  Location: [facility] INVASIVE CV LAB;  Service: Cardiovascular;   Laterality: N/A;   SHOULDER ARTHROSCOPY WITH ROTATOR CUFF REPAIR AND OPEN BICEPS TENODESIS Right 11/09/2019   Procedure: RIGHT SHOULDER ARTHROSCOPY WITH SUBSCAPULARIS REPAIR, SUBACROMIAL DECOMPRESSION,MINI OPEN ROTATOR CUFF REPAIR;  Surgeon: Signa Kell, MD;  Location: [facility] ORS;  Service: Orthopedics;  Laterality: Right;   TEE WITHOUT CARDIOVERSION N/A 07/26/2022   Procedure: TRANSESOPHAGEAL ECHOCARDIOGRAM (TEE);  Surgeon: Lanier Prude, MD;  Location: [facility] INVASIVE CV LAB;  Service: Cardiovascular;  Laterality: N/A;   Patient Active Problem List   Diagnosis Date Noted   Urinary tract infection without hematuria 12/19/2022   Painless jaundice 12/18/2022   Elevated LFTs 12/18/2022   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   Obstructive jaundice 12/17/2022   Atrial fibrillation (HCC) 07/26/2022   Presence of Watchman left atrial appendage closure device 07/26/2022   Proteinuria, unspecified 05/23/2022   Simple renal cyst 05/23/2022   Long term current use of anticoagulant 04/04/2022   Rotator cuff syndrome 04/04/2022   Exposure to potentially hazardous substance 04/04/2022   Gout 04/04/2022   Osteoarthritis 04/04/2022   Osteoporosis 04/04/2022   Other specified diseases of hair and hair follicles 04/04/2022   Pain in joint, lower leg 04/04/2022   Problem related to unspecified psychosocial circumstances 04/04/2022   General medical examination for administrative purposes 04/04/2022   Stage 3b chronic kidney disease (HCC)  Dysphagia, post-stroke    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   Cough 02/20/2022   Acute metabolic encephalopathy 02/20/2022   Obstructive hydrocephalus (HCC) 02/19/2022   Dyslipidemia 02/19/2022   Dysphagia 02/19/2022   AKI (acute kidney injury) (HCC)    Anemia    Pleural effusion on right    Respiratory failure requiring intubation (HCC)    Cerebral edema (HCC)    Chronic atrial fibrillation (HCC)    Controlled type 2 diabetes mellitus with  hyperglycemia, without long-term current use of insulin (HCC)    ICH (intracerebral hemorrhage) (HCC) 02/10/2022   Intracranial hemorrhage (HCC)    Anticoagulated    Moderate tricuspid regurgitation 07/26/2021   Moderate aortic valve stenosis 03/08/2020   MDS (myelodysplastic syndrome) (HCC) 02/18/2020   Macrocytic anemia 01/13/2020   Spleen enlargement 01/13/2020   Bilateral carotid artery stenosis 07/14/2018   Atrial fibrillation, chronic (HCC) 07/14/2018   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   Essential hypertension 08/16/2014   Hyperlipemia, mixed 08/16/2014   Renal insufficiency 08/16/2014    ONSET DATE: 02/10/2022  REFERRING DIAG:  I63.9 (ICD-10-CM) - Cerebral infarction, unspecified  I63.9 (ICD-10-CM) - CVA (cerebral vascular accident) (HCC)    THERAPY DIAG:  Muscle weakness (generalized)  Other lack of coordination  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 Pt hospitalized 1/22-1/25/24 for obstructive jaundice. Pt reports it was found he had a UTI with recent hospital stay. He wants to improve his endurance and balance. PMH significant for MDS, chronic a-fib, HTN, DMII, Presence of Watchman Left atrial appendage closure device, anemia, arthritis, hx of hiatal hernia, hx  craniotomy 02/10/22, L atrial appendage occlusion 07/26/22, TEE without cardioversion 07/26/2022.     PAIN:  Are you having pain? Yes, 2/10 Left knee resting, worse with standing, walking, terminal extension  PRECAUTIONS: Fall  WEIGHT BEARING RESTRICTIONS: No  FALLS: Has patient fallen in last 6 months? No  PATIENT GOALS: improve coordination, endurance and balance  OBJECTIVE:  INTERVENTION THIS DATE:     NMR: Performed with CGA and use of gait belt unless otherwise stated High knee march walk in // bars x 5 (initially with BUE support and last 6-8 feet on last round without UE Support)  Tandem walking in // bars with minimal UE support as needed- down and back x 6 (more like closer to near tandem- used 3# AW around left wrist to decrease ballistic movement)  Side step- down and back in // bars with RTB around ankles x 4.  Static stand (rear foot on airex pad and front foot on 6" step) - hold 30 sec x2 each LE Static stand (rear foot on airex pad and front foot on 6" step) - Horizontal head turns x 10 (unsteady requiring some UE support)  Dynamic step tap (starting with B feet on blue airex pad  - step tap onto 6" step)  x 12 reps  Forward/retro gait in // bars  x 10 attempting without UE Support (Increased unsteadiness and VC for increased step length)    PATIENT EDUCATION: Education details: exam findings, plan, testing Person educated: Patient and Spouse Education method: Explanation Education comprehension: verbalized understanding  HOME EXERCISE PROGRAM: To be initiated next 1-2 visits   GOALS: Goals reviewed with patient? Yes  SHORT TERM GOALS: Target date: 02/20/2023  Patient will be independent in home exercise program to improve strength/mobility for better functional independence with ADLs. Baseline: to be intiated; 02/13/2023= Patient reports no issues with current HEP.  Goal status: MET   LONG TERM GOALS: Target date: 07/01/2023   Patient will complete five  times sit to stand test in < 10 seconds indicating an increased LE strength and improved balance. Baseline: 14.89 sec; 02/13/2023= 11.99 without UE Support; 5/13= 10.54 sec  Goal status: Progressing  2.  Patient will increase 10 meter walk test to >1.2m/s as to improve gait speed for better community ambulation and to reduce fall risk. Baseline:  0.82 m/s; 02/13/2023= 1.03 m/s with RW Goal status: MET  3.  Patient will increase Berg Balance score by > 6 points to demonstrate decreased fall risk during functional activities. Baseline: 39/56; 02/13/2023=41/56; 5/13- not tested secondary to time constraints- will test next session.  Goal status: Progressing  4.  Patient will increase six minute walk test distance to >1000 for progression to community ambulator and improve gait ability Baseline: 795'; 02/13/2023= 890 feet with RW and 2 brief standing rest breaks; 04/08/2023= 912 feet with 2 brief standing rest breaks- using RW Goal status: Progressing  5.  Patient will demonstrate improved function as evidenced by a score of 62 on FOTO measure for full participation in activities at home and in the community. Baseline: 59; 02/13/2023=62; 04/08/2023- Will reassess next visit - limited secondary to time constraints. 04/29/2023= 54 Goal status: Progressing  ASSESSMENT:  CLINICAL IMPRESSION: Patient performed well and seemed more balance with some resistance on his left wrist - less ballistic UE movement. He was very challenged with some dynamic balance- tandem specifically and also horizontal head turns.  Pt will continue to benefit from skilled PT services to address deficits and impairment identified in evaluation in order to maximize independence and safety in basic mobility required for performance of ADL, IADL, and leisure.     OBJECTIVE IMPAIRMENTS: Abnormal gait, decreased activity tolerance, decreased balance, decreased coordination, decreased endurance, decreased knowledge of use of DME,  decreased mobility, difficulty walking, decreased strength, impaired UE functional use, improper body mechanics, postural dysfunction, and pain.  ACTIVITY LIMITATIONS: carrying, lifting, bending, standing, squatting, stairs, transfers, and locomotion level  PARTICIPATION LIMITATIONS: meal prep, cleaning, laundry, driving, shopping, community activity, and yard work  PERSONAL FACTORS: Age, Past/current experiences, Time since onset of injury/illness/exacerbation, and 3+ comorbidities: PMH significant for MDS, chronic a-fib, HTN, DMII, Presence of Watchman Left atrial appendage closure device, anemia, arthritis, hx of hiatal hernia, hx craniotomy 02/10/22, L atrial appendage occlusion 07/26/22, TEE without cardioversion 07/26/2022  are also affecting patient's functional outcome.   REHAB POTENTIAL: Good  CLINICAL DECISION MAKING: Evolving/moderate complexity  EVALUATION COMPLEXITY: Moderate  PLAN:  PT FREQUENCY: 1-2x/week  PT DURATION: 12 weeks  PLANNED INTERVENTIONS: Therapeutic exercises, Therapeutic activity, Neuromuscular re-education, Balance training, Gait training, Patient/Family education, Self Care, Joint mobilization, Stair training, Vestibular training, Canalith repositioning, Visual/preceptual remediation/compensation, Orthotic/Fit training, DME instructions, Electrical stimulation, Wheelchair mobility training, Spinal mobilization, Cryotherapy, Moist heat, Splintting, Taping, Ultrasound, Manual therapy, and Re-evaluation  PLAN FOR NEXT SESSION:  Continue with LE strengthening exercises and balance related tasks as tolerated given his left knee DJD limitations   Louis Meckel, PT Physical Therapist - [facility]  8:04 AM 04/30/23    04/30/23, 8:04 AM

## 2023-04-30 ENCOUNTER — Other Ambulatory Visit: Payer: Self-pay

## 2023-04-30 DIAGNOSIS — D539 Nutritional anemia, unspecified: Secondary | ICD-10-CM

## 2023-05-01 ENCOUNTER — Ambulatory Visit: Payer: Medicare Other

## 2023-05-01 ENCOUNTER — Inpatient Hospital Stay: Payer: Medicare Other

## 2023-05-01 ENCOUNTER — Inpatient Hospital Stay: Payer: Medicare Other | Attending: Internal Medicine

## 2023-05-01 VITALS — BP 146/67

## 2023-05-01 DIAGNOSIS — D4621 Refractory anemia with excess of blasts 1: Secondary | ICD-10-CM | POA: Insufficient documentation

## 2023-05-01 DIAGNOSIS — D539 Nutritional anemia, unspecified: Secondary | ICD-10-CM

## 2023-05-01 DIAGNOSIS — D469 Myelodysplastic syndrome, unspecified: Secondary | ICD-10-CM

## 2023-05-01 LAB — HEMOGLOBIN AND HEMATOCRIT (CANCER CENTER ONLY)
HCT: 25.5 % — ABNORMAL LOW (ref 39.0–52.0)
Hemoglobin: 8.4 g/dL — ABNORMAL LOW (ref 13.0–17.0)

## 2023-05-01 MED ORDER — DARBEPOETIN ALFA 100 MCG/0.5ML IJ SOSY
100.0000 ug | PREFILLED_SYRINGE | Freq: Once | INTRAMUSCULAR | Status: AC
  Administered 2023-05-01: 100 ug via SUBCUTANEOUS
  Filled 2023-05-01: qty 0.5

## 2023-05-06 ENCOUNTER — Ambulatory Visit: Payer: Medicare Other

## 2023-05-06 ENCOUNTER — Encounter: Payer: Medicare Other | Admitting: Occupational Therapy

## 2023-05-06 DIAGNOSIS — R2681 Unsteadiness on feet: Secondary | ICD-10-CM

## 2023-05-06 DIAGNOSIS — R262 Difficulty in walking, not elsewhere classified: Secondary | ICD-10-CM

## 2023-05-06 DIAGNOSIS — I69254 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: Secondary | ICD-10-CM

## 2023-05-06 DIAGNOSIS — M6281 Muscle weakness (generalized): Secondary | ICD-10-CM | POA: Diagnosis not present

## 2023-05-06 DIAGNOSIS — R278 Other lack of coordination: Secondary | ICD-10-CM

## 2023-05-06 DIAGNOSIS — R2689 Other abnormalities of gait and mobility: Secondary | ICD-10-CM

## 2023-05-06 NOTE — Therapy (Signed)
OUTPATIENT PHYSICAL THERAPY TREATMENT  Patient Name: Vernon [name] MRN: [id number] DOB:01-22-1946, 77 y.o., male Today's Date: 05/07/2023   PCP:  Gracelyn Nurse, MD   REFERRING PROVIDER:  Gracelyn Nurse, MD    END OF SESSION:  PT End of Session - 05/06/23 1449     Visit Number 19    Number of Visits 41    Date for PT Re-Evaluation 07/01/23    Authorization Type [employer] Medicare    Progress Note Due on Visit 20    PT Start Time 1439    PT Stop Time 1515    PT Time Calculation (min) 36 min    Equipment Utilized During Treatment Gait belt    Activity Tolerance Patient tolerated treatment well    Behavior During Therapy WFL for tasks assessed/performed                Past Medical History:  Diagnosis Date   Anemia    Aortic stenosis    Arthritis    Complication of anesthesia    hard time getting bp up after knee replacement   Coronary artery disease    Diabetes mellitus without complication (HCC)    GERD (gastroesophageal reflux disease)    occ tums prn   History of hiatal hernia    Hypertension    MDS (myelodysplastic syndrome) (HCC)    Presence of Watchman left atrial appendage closure device 07/26/2022   27mm Watchman FLX with Dr. Lalla Brothers   Past Surgical History:  Procedure Laterality Date   CARPAL TUNNEL RELEASE  2012   CRANIOTOMY N/A 02/10/2022   Procedure: SUBOCCIPITAL CRANIECTOMY FOR EVACUATION OF CEREBELLAR HEMATOMA;  Surgeon: Barnett Abu, MD;  Location: [facility] OR;  Service: Neurosurgery;  Laterality: N/A;   JOINT REPLACEMENT Right 2010   LEFT ATRIAL APPENDAGE OCCLUSION N/A 07/26/2022   Procedure: LEFT ATRIAL APPENDAGE OCCLUSION;  Surgeon: Lanier Prude, MD;  Location: [facility] INVASIVE CV LAB;  Service: Cardiovascular;  Laterality: N/A;   RIGHT/LEFT HEART CATH AND CORONARY ANGIOGRAPHY N/A 04/10/2023   Procedure: RIGHT/LEFT HEART CATH AND CORONARY ANGIOGRAPHY;  Surgeon: Marykay Lex, MD;  Location: [facility] INVASIVE CV LAB;  Service: Cardiovascular;   Laterality: N/A;   SHOULDER ARTHROSCOPY WITH ROTATOR CUFF REPAIR AND OPEN BICEPS TENODESIS Right 11/09/2019   Procedure: RIGHT SHOULDER ARTHROSCOPY WITH SUBSCAPULARIS REPAIR, SUBACROMIAL DECOMPRESSION,MINI OPEN ROTATOR CUFF REPAIR;  Surgeon: Signa Kell, MD;  Location: [facility] ORS;  Service: Orthopedics;  Laterality: Right;   TEE WITHOUT CARDIOVERSION N/A 07/26/2022   Procedure: TRANSESOPHAGEAL ECHOCARDIOGRAM (TEE);  Surgeon: Lanier Prude, MD;  Location: [facility] INVASIVE CV LAB;  Service: Cardiovascular;  Laterality: N/A;   Patient Active Problem List   Diagnosis Date Noted   Urinary tract infection without hematuria 12/19/2022   Painless jaundice 12/18/2022   Elevated LFTs 12/18/2022   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   Obstructive jaundice 12/17/2022   Atrial fibrillation (HCC) 07/26/2022   Presence of Watchman left atrial appendage closure device 07/26/2022   Proteinuria, unspecified 05/23/2022   Simple renal cyst 05/23/2022   Long term current use of anticoagulant 04/04/2022   Rotator cuff syndrome 04/04/2022   Exposure to potentially hazardous substance 04/04/2022   Gout 04/04/2022   Osteoarthritis 04/04/2022   Osteoporosis 04/04/2022   Other specified diseases of hair and hair follicles 04/04/2022   Pain in joint, lower leg 04/04/2022   Problem related to unspecified psychosocial circumstances 04/04/2022   General medical examination for administrative purposes 04/04/2022   Stage 3b chronic kidney disease (HCC)  Dysphagia, post-stroke    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   Cough 02/20/2022   Acute metabolic encephalopathy 02/20/2022   Obstructive hydrocephalus (HCC) 02/19/2022   Dyslipidemia 02/19/2022   Dysphagia 02/19/2022   AKI (acute kidney injury) (HCC)    Anemia    Pleural effusion on right    Respiratory failure requiring intubation (HCC)    Cerebral edema (HCC)    Chronic atrial fibrillation (HCC)    Controlled type 2 diabetes mellitus with  hyperglycemia, without long-term current use of insulin (HCC)    ICH (intracerebral hemorrhage) (HCC) 02/10/2022   Intracranial hemorrhage (HCC)    Anticoagulated    Moderate tricuspid regurgitation 07/26/2021   Moderate aortic valve stenosis 03/08/2020   MDS (myelodysplastic syndrome) (HCC) 02/18/2020   Macrocytic anemia 01/13/2020   Spleen enlargement 01/13/2020   Bilateral carotid artery stenosis 07/14/2018   Atrial fibrillation, chronic (HCC) 07/14/2018   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   Essential hypertension 08/16/2014   Hyperlipemia, mixed 08/16/2014   Renal insufficiency 08/16/2014    ONSET DATE: 02/10/2022  REFERRING DIAG:  I63.9 (ICD-10-CM) - Cerebral infarction, unspecified  I63.9 (ICD-10-CM) - CVA (cerebral vascular accident) (HCC)    THERAPY DIAG:  Muscle weakness (generalized)  Other lack of coordination  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 He is requesting a 2 month break from PT services today due to fatigue of rehab and multiple upcoming medical appointments.       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 Pt hospitalized 1/22-1/25/24 for obstructive jaundice. Pt reports it was found he had a UTI with recent hospital stay. He wants to improve his endurance and balance. PMH significant for MDS, chronic  a-fib, HTN, DMII, Presence of Watchman Left atrial appendage closure device, anemia, arthritis, hx of hiatal hernia, hx craniotomy 02/10/22, L atrial appendage occlusion 07/26/22, TEE without cardioversion 07/26/2022.     PAIN:  Are you having pain? Yes, 4/10 Groin pain  PRECAUTIONS: Fall  WEIGHT BEARING RESTRICTIONS: No  FALLS: Has patient fallen in last 6  months? No  PATIENT GOALS: improve coordination, endurance and balance  OBJECTIVE:    INTERVENTION THIS DATE:     NMR: Performed with CGA and use of gait belt unless otherwise stated  Tandem standing at support bar without UE support x 30 sec x 3 each LE- Initial difficulty- able to hold up to 17 sec - improved up to 30 on last set - Standing Single Leg Stance -attempting to hold as long as possible X multiple attempts each LE- up to 5 sec on left and 8 sec on right - Step Taps on High Step  x 25 reps without UE support - Standing Single Leg Hip ER  2 sets of 10 reps  PATIENT EDUCATION: Education details: exam findings, plan, testing Person educated: Patient and Spouse Education method: Explanation Education comprehension: verbalized understanding  HOME EXERCISE PROGRAM: Access Code: [email or web address] URL: [email or web address] Date: 05/06/2023 Prepared by: Maureen Ralphs  Exercises - Tandem Walking with Counter Support  - 1 x daily - 3 x weekly - 3 sets - 10 reps - Backward Tandem Walking with Counter Support  - 1 x daily - 3 x weekly - 3 sets - 10 reps - Standing Single Leg Stance with Counter Support  - 1 x daily - 3 x weekly - 3 sets - 5-10 hold - Step Taps on High Step  - 1 x daily - 3 x weekly - 3 sets - 10 reps - Standing Single Leg Hip ER   - 1 x daily - 7 x weekly - 3 sets - 10 reps  GOALS: Goals reviewed with patient? Yes  SHORT TERM GOALS: Target date: 02/20/2023  Patient will be independent in home exercise program to improve strength/mobility for better functional independence with  ADLs. Baseline: to be intiated; 02/13/2023= Patient reports no issues with current HEP.  Goal status: MET   LONG TERM GOALS: Target date: 07/01/2023   Patient will complete five times sit to stand test in < 10 seconds indicating an increased LE strength and improved balance. Baseline: 14.89 sec; 02/13/2023= 11.99 without UE Support; 5/13= 10.54 sec  Goal status: Progressing  2.  Patient will increase 10 meter walk test to >1.58m/s as to improve gait speed for better community ambulation and to reduce fall risk. Baseline:  0.82 m/s; 02/13/2023= 1.03 m/s with RW Goal status: MET  3.  Patient will increase Berg Balance score by > 6 points to demonstrate decreased fall risk during functional activities. Baseline: 39/56; 02/13/2023=41/56; 5/13- not tested secondary to time constraints- will test next session.  Goal status: Progressing  4.  Patient will increase six minute walk test distance to >1000 for progression to community ambulator and improve gait ability Baseline: 795'; 02/13/2023= 890 feet with RW and 2 brief standing rest breaks; 04/08/2023= 912 feet with 2 brief standing rest breaks- using RW Goal status: Progressing  5.  Patient will demonstrate improved function as evidenced by a score of 62 on FOTO measure for full participation in activities at home and in the community. Baseline: 59; 02/13/2023=62; 04/08/2023- Will reassess next visit - limited secondary to time constraints. 04/29/2023= 54 Goal status: Progressing  ASSESSMENT:  CLINICAL IMPRESSION: Patient presents to PT with reported exhaustion and requesting to take a break but wanting to return in 8 weeks. Treatment focused on developing/tweaking HEP to incorporate more balance and patient mostly challenged with single limb standing and tandem standing. He was provided a HEP to practice and review with patient and wife today. PT will hold x 8  weeks per patient request.  Pt will continue to benefit from skilled PT services to address  deficits and impairment identified in evaluation in order to maximize independence and safety in basic mobility required for performance of ADL, IADL, and leisure.     OBJECTIVE IMPAIRMENTS: Abnormal gait, decreased activity tolerance, decreased balance, decreased coordination, decreased endurance, decreased knowledge of use of DME, decreased mobility, difficulty walking, decreased strength, impaired UE functional use, improper body mechanics, postural dysfunction, and pain.   ACTIVITY LIMITATIONS: carrying, lifting, bending, standing, squatting, stairs, transfers, and locomotion level  PARTICIPATION LIMITATIONS: meal prep, cleaning, laundry, driving, shopping, community activity, and yard work  PERSONAL FACTORS: Age, Past/current experiences, Time since onset of injury/illness/exacerbation, and 3+ comorbidities: PMH significant for MDS, chronic a-fib, HTN, DMII, Presence of Watchman Left atrial appendage closure device, anemia, arthritis, hx of hiatal hernia, hx craniotomy 02/10/22, L atrial appendage occlusion 07/26/22, TEE without cardioversion 07/26/2022  are also affecting patient's functional outcome.   REHAB POTENTIAL: Good  CLINICAL DECISION MAKING: Evolving/moderate complexity  EVALUATION COMPLEXITY: Moderate  PLAN:  PT FREQUENCY: 1-2x/week  PT DURATION: 12 weeks  PLANNED INTERVENTIONS: Therapeutic exercises, Therapeutic activity, Neuromuscular re-education, Balance training, Gait training, Patient/Family education, Self Care, Joint mobilization, Stair training, Vestibular training, Canalith repositioning, Visual/preceptual remediation/compensation, Orthotic/Fit training, DME instructions, Electrical stimulation, Wheelchair mobility training, Spinal mobilization, Cryotherapy, Moist heat, Splintting, Taping, Ultrasound, Manual therapy, and Re-evaluation  PLAN FOR NEXT SESSION:  Continue with LE strengthening exercises and balance related tasks as tolerated given his left knee DJD  limitations. Resume in approx 8 weeks- patient requesting pause in PT at this time.    Louis Meckel, PT Physical Therapist - [facility]  3:46 PM 05/07/23    05/07/23, 3:46 PM

## 2023-05-08 ENCOUNTER — Ambulatory Visit: Payer: Medicare Other | Admitting: Neurology

## 2023-05-08 ENCOUNTER — Encounter: Payer: Self-pay | Admitting: Neurology

## 2023-05-08 VITALS — BP 131/65 | HR 56 | Ht 69.0 in | Wt 189.2 lb

## 2023-05-08 DIAGNOSIS — Z8673 Personal history of transient ischemic attack (TIA), and cerebral infarction without residual deficits: Secondary | ICD-10-CM | POA: Diagnosis not present

## 2023-05-08 NOTE — Patient Instructions (Signed)
I had a long discussion with the patient and his wife regarding his recent cerebral hemorrhage related to anticoagulation with Eliquis for his atrial fibrillation and he is doing well after having Watchman device.  I recommend he continue aspirin for stroke prevention and maintain aggressive risk factor modification with strict control of hypertension with blood pressure goal below 140/90, lipids with LDL cholesterol goal below 70 mg and diabetes with hemoglobin A1c goal below 6.5%.  I advised him to use his walker at all times and we discussed fall safety precautions.  He also has mild cognitive impairment and encouraged him to increase participation in cognitively challenging activities like solving crossword puzzles, playing bridge, word searches and sudoku.  We also discussed memory compensation strategies.  He will return for lipid profile checked at his upcoming visit and med cholesterol is still suboptimal may need to go back on statin.  In the future in 1 year or call earlier if necessary.  I advised him to see his primary care physician to check follow-up lipid profile cholesterol is still suboptimal he may need to go back on a statin.

## 2023-05-08 NOTE — Progress Notes (Signed)
[facility] [address] [place]. [place] [address] [phone]       OFFICE FOLLOW-UP NOTE  Vernon [name] Date of Birth:  20-Apr-1946 Medical Record Number:  [id number]   HPI: Initial visit 06/07/2022 Vernon [name] is a 77 year old Caucasian male seen today for initial office follow-up visit following hospital admission for intracerebral hemorrhage in March 2023.  He is accompanied by his wife.  History is obtained from them and review of electronic medical records and opossum reviewed pertinent available imaging films in PACS. Vernon [name] is a 77 y.o. male with history of anemia, arthritis, GERD, DM2, HTN and a-fib on Eliquis presened on 02/10/22 with vomiting and incoordiantion on the left side.  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  His Eliquis was reversed with [name] and he was transferred from outside hospital to [facility]..  His neurological status began to worsen and he was intubated.  Repeat CT demonstrated enlarging ICH.  Neurosurgery was consulted and decompressive suboccipital craniectomy was performed on 02/11/22 by Dr Danielle Dess.  3% HTS was started for cerebral edema. Patient has been extubated since 3/23 and did well.  Blood pressure goal tightly controlled.  Eliquis was held.  He was seen by physical occupational speech therapy.  Serial CT scan showed stable appearance of the hemorrhage and mass effect and postoperative changes.  Echocardiogram showed ejection fraction of 55 to 60%.  LDL cholesterol is 25 mg percent.  Hemoglobin A1c is 6.2.  Patient was felt to be a good candidate for inpatient rehab and was transferred there and did well and made gradual improvement and was discharged home on 03/20/2022.  He still getting home physical and occupational therapy which she has finished and recently started outpatient therapies.  He is able to ambulate with a walker but somebody has to stay close to him.  His left-sided coordination is  improving but is not back to normal.  His voice occasionally gets raspy when he is tired and he has some word finding difficulties.  Wife has also noticed that his comprehension is not good and his information processing is also not back to normal.  She has to repeat herself several times at times.  Patient had a follow-up CT scan on 02/20/2022 which showed expected evolutionary changes and postop changes in the left cerebral hemorrhage.  He was restarted on Eliquis which is tolerating well without bruising or bleeding.  Patient and wife are interested in the Watchman procedure and wants me to refer to Dr. Lalla Brothers.    Update 10/23/2022 : He returns for follow-up after last visit with me 4 months ago.  Is accompanied by his wife.  He is doing well.  He is noted improvement in his left hemiataxia walking better with a walker.  He has had no falls or injuries.  He still has some trouble with coordination of his left hand when he is trying to rush and not being attention.  He saw Dr. Lalla Brothers and underwent Watchman procedure successfully on 07/27/2019.  Without any complications.  He has stopped Eliquis and is now on aspirin milligram alone which is tolerating well without bruising or bleeding.  He states his blood pressure is in the 130s usually at home but today it is a little does not regularly check his blood sugars.  Last hemoglobin A1c was 6.7 in June.  He has not had any falls or injuries.  He blurred vision to see his eye doctor  weight loss from 203 to 184 pounds and.  I advised him to discuss with his primary care physician for further evaluation  Update 05/08/2023 ; he returns for follow-up after last visit 6 months ago.  He is accompanied by his wife.  Patient states he is doing well.  He did a course of physical occupational therapy in January February with has had improved his balance and walking.  He is currently doing a course of 12 injections of acupuncture by Dr. Lesli Albee in [place] to see if it  helps improve his coordination and balance.  He feels he is regaining some increased sensitivity on the left side and now can feel the texture arthroplasty when he holds it in his left hand.  He has not had any recurrent stroke or TIA symptoms.  Remains on aspirin which is tolerating well with minor bruising but no bleeding episodes.  He is only on Zetia for his cholesterol and last lipid profile on 12/15/2022 showed LDL cholesterol to be suboptimal at 136 mg percent.  Hemoglobin A1c was 5.7.  He does plan to have repeat lipid profile checked at his upcoming annual physical exam with his PCP in a few weeks.  He is careful and uses walker all the time and has not had not had any falls or injuries. ROS:   14 system review of systems is positive for incoordination, gait imbalance, blurred vision difficulty thinking, memory loss, word finding difficulty all other systems negative  PMH:  Past Medical History:  Diagnosis Date   Anemia    Aortic stenosis    Arthritis    Complication of anesthesia    hard time getting bp up after knee replacement   Coronary artery disease    Diabetes mellitus without complication (HCC)    GERD (gastroesophageal reflux disease)    occ tums prn   History of hiatal hernia    Hypertension    MDS (myelodysplastic syndrome) (HCC)    Presence of Watchman left atrial appendage closure device 07/26/2022   27mm Watchman FLX with Dr. Lalla Brothers    Social History:  Social History   Socioeconomic History   Marital status: Married    Spouse name: Not on file   Number of children: 2   Years of education: Not on file   Highest education level: Not on file  Occupational History   Occupation: Retired - [employer]  Tobacco Use   Smoking status: Former    Packs/day: 0.50    Years: 8.00    Additional pack years: 0.00    Total pack years: 4.00    Types: Cigarettes    Quit date: 07/21/1978    Years since quitting: 44.8   Smokeless tobacco: Never  Vaping Use   Vaping Use:  Never used  Substance and Sexual Activity   Alcohol use: Yes    Alcohol/week: 0.0 - 1.0 standard drinks of alcohol    Comment: rare beer   Drug use: Never   Sexual activity: Not on file  Other Topics Concern   Not on file  Social History Narrative   Lives in [place]; with wife; quit smoking in early 75s; ocassional/ rare [may be 1 a month beer]. 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    Social Determinants of Health   Financial Resource Strain: Not on file  Food Insecurity: No Food Insecurity (12/18/2022)   Hunger Vital Sign    Worried About Running Out of Food in the Last Year: Never true    Ran  Out of Food in the Last Year: Never true  Transportation Needs: No Transportation Needs (12/18/2022)   PRAPARE - Administrator, Civil Service (Medical): No    Lack of Transportation (Non-Medical): No  Physical Activity: Not on file  Stress: Not on file  Social Connections: Not on file  Intimate Partner Violence: Not At Risk (12/18/2022)   Humiliation, Afraid, Rape, and Kick questionnaire    Fear of Current or Ex-Partner: No    Emotionally Abused: No    Physically Abused: No    Sexually Abused: No    Medications:   Current Outpatient Medications on File Prior to Visit  Medication Sig Dispense Refill   aspirin EC 81 MG tablet Take 1 tablet (81 mg total) by mouth daily. Swallow whole. 90 tablet 3   ezetimibe (ZETIA) 10 MG tablet Take 1 tablet (10 mg total) by mouth daily. 90 tablet 3   furosemide (LASIX) 20 MG tablet Take 0.5 tablets (10 mg total) by mouth daily. May take extra tablet if needed 30 tablet 3   metFORMIN (GLUCOPHAGE) 500 MG tablet Take 500 mg by mouth 2 (two) times daily with a meal.     Multiple Vitamin (MULTI-VITAMIN) tablet Take 1 tablet by mouth daily.     No current facility-administered medications on file prior to visit.    Allergies:  No Known Allergies  Physical Exam General: Obese elderly Caucasian male, seated, in no evident  distress Head: head normocephalic and atraumatic.  Neck: supple with no carotid or supraclavicular bruits Cardiovascular: regular rate and rhythm, no murmurs Musculoskeletal: no deformity Skin:  no rash/petichiae Vascular:  Normal pulses all extremities Vitals:   05/08/23 1405  BP: 131/65  Pulse: (!) 56   Neurologic Exam Mental Status: Awake and fully alert. Oriented to place and time. Recent and remote memory intact. Attention span, concentration and fund of knowledge appropriate. Mood and affect appropriate.  Diminished recall 2/3.  Able to name 13 animals which can walk on 4 legs. Cranial Nerves: Fundoscopic exam not done. Pupils equal, briskly reactive to light. Extraocular movements full without nystagmus. Visual fields full to confrontation. Hearing intact. Facial sensation intact. Face, tongue, palate moves normally and symmetrically.  Motor: Normal bulk and tone. Normal strength in all tested extremity muscles. Sensory.: intact to touch ,pinprick .position and vibratory sensation.  Coordination: Mildly impaired left finger-to-nose and knee to heel coordination on the left. Gait and Station: Arises from chair without difficulty. Stance is broad-based uses a wheeled walker gait is slightly ataxic.   Reflexes: 1+ and symmetric. Toes downgoing.   NIHSS  2 Modified Rankin  3   ASSESSMENT: 77 year old Caucasian male with left cerebellar hemorrhage in March 2023 secondary to anticoagulation with Eliquis for chronic A-fib s/p decompressive suboccipital craniectomy is doing well but still has residual left hemiataxia , gait imbalance and mild cognitive impairment     PLAN:  I had a long discussion with the patient and his wife regarding his recent cerebral hemorrhage related to anticoagulation with Eliquis for his atrial fibrillation and he is doing well after having Watchman device.  I recommend he continue aspirin for stroke prevention and maintain aggressive risk factor modification  with strict control of hypertension with blood pressure goal below 140/90, lipids with LDL cholesterol goal below 70 mg and diabetes with hemoglobin A1c goal below 6.5%.  I advised him to use his walker at all times and we discussed fall safety precautions.  He also has mild cognitive impairment and encouraged him  to increase participation in cognitively challenging activities like solving crossword puzzles, playing bridge, word searches and sudoku.  We also discussed memory compensation strategies.  He will return for lipid profile checked at his upcoming visit and med cholesterol is still suboptimal may need to go back on statin.  In the future in 1 year or call earlier if necessary.  I advised him to see his primary care physician to check follow-up lipid profile cholesterol is still suboptimal he may need to go back on a statin. Greater than 50% of time during this 35 minute visit was spent on counseling,explanation of diagnosis, planning of further management, discussion with patient and family and coordination of care Delia Heady, MD Note: This document was prepared with digital dictation and possible smart phrase technology. Any transcriptional errors that result from this process are unintentional

## 2023-05-13 ENCOUNTER — Ambulatory Visit: Payer: Medicare Other

## 2023-05-13 ENCOUNTER — Encounter: Payer: Medicare Other | Admitting: Occupational Therapy

## 2023-05-15 ENCOUNTER — Other Ambulatory Visit: Payer: Self-pay | Admitting: Internal Medicine

## 2023-05-15 ENCOUNTER — Ambulatory Visit: Payer: Medicare Other

## 2023-05-15 ENCOUNTER — Inpatient Hospital Stay: Payer: Medicare Other

## 2023-05-15 VITALS — BP 154/62 | HR 44

## 2023-05-15 DIAGNOSIS — D4621 Refractory anemia with excess of blasts 1: Secondary | ICD-10-CM | POA: Diagnosis not present

## 2023-05-15 DIAGNOSIS — D469 Myelodysplastic syndrome, unspecified: Secondary | ICD-10-CM

## 2023-05-15 LAB — HEMOGLOBIN AND HEMATOCRIT (CANCER CENTER ONLY)
HCT: 22.9 % — ABNORMAL LOW (ref 39.0–52.0)
Hemoglobin: 7.7 g/dL — ABNORMAL LOW (ref 13.0–17.0)

## 2023-05-15 MED ORDER — DARBEPOETIN ALFA 300 MCG/0.6ML IJ SOSY
300.0000 ug | PREFILLED_SYRINGE | Freq: Once | INTRAMUSCULAR | Status: AC
  Administered 2023-05-15: 300 ug via SUBCUTANEOUS
  Filled 2023-05-15: qty 0.6

## 2023-05-15 NOTE — Progress Notes (Signed)
Hgb dropped to 7.7. pt asymptomatic. He stated he "didn't feel any different." Pt on sch today for 100 mcg aranesp. I spoke with Dr. Leonard Schwartz - he will change the order for aranesp to 300 mcg on treatment plan.

## 2023-05-20 ENCOUNTER — Ambulatory Visit: Payer: Medicare Other

## 2023-05-22 ENCOUNTER — Ambulatory Visit: Payer: Medicare Other

## 2023-05-27 ENCOUNTER — Ambulatory Visit: Payer: Medicare Other

## 2023-05-29 ENCOUNTER — Inpatient Hospital Stay: Payer: Medicare Other

## 2023-05-29 ENCOUNTER — Inpatient Hospital Stay: Payer: Medicare Other | Attending: Internal Medicine

## 2023-05-29 ENCOUNTER — Ambulatory Visit: Payer: Medicare Other

## 2023-05-29 ENCOUNTER — Inpatient Hospital Stay (HOSPITAL_BASED_OUTPATIENT_CLINIC_OR_DEPARTMENT_OTHER): Payer: Medicare Other | Admitting: Medical Oncology

## 2023-05-29 ENCOUNTER — Other Ambulatory Visit: Payer: Self-pay

## 2023-05-29 VITALS — BP 130/61 | HR 48 | Temp 97.2°F | Resp 18 | Ht 69.0 in | Wt 195.0 lb

## 2023-05-29 DIAGNOSIS — D4621 Refractory anemia with excess of blasts 1: Secondary | ICD-10-CM | POA: Diagnosis present

## 2023-05-29 DIAGNOSIS — R17 Unspecified jaundice: Secondary | ICD-10-CM

## 2023-05-29 DIAGNOSIS — D631 Anemia in chronic kidney disease: Secondary | ICD-10-CM | POA: Diagnosis not present

## 2023-05-29 DIAGNOSIS — D469 Myelodysplastic syndrome, unspecified: Secondary | ICD-10-CM | POA: Diagnosis not present

## 2023-05-29 DIAGNOSIS — N1832 Chronic kidney disease, stage 3b: Secondary | ICD-10-CM | POA: Diagnosis not present

## 2023-05-29 DIAGNOSIS — D539 Nutritional anemia, unspecified: Secondary | ICD-10-CM

## 2023-05-29 DIAGNOSIS — N183 Chronic kidney disease, stage 3 unspecified: Secondary | ICD-10-CM | POA: Insufficient documentation

## 2023-05-29 DIAGNOSIS — D4620 Refractory anemia with excess of blasts, unspecified: Secondary | ICD-10-CM | POA: Diagnosis not present

## 2023-05-29 DIAGNOSIS — I10 Essential (primary) hypertension: Secondary | ICD-10-CM | POA: Diagnosis not present

## 2023-05-29 LAB — IRON AND TIBC
Iron: 121 ug/dL (ref 45–182)
Saturation Ratios: 42 % — ABNORMAL HIGH (ref 17.9–39.5)
TIBC: 290 ug/dL (ref 250–450)
UIBC: 169 ug/dL

## 2023-05-29 LAB — BASIC METABOLIC PANEL - CANCER CENTER ONLY
Anion gap: 7 (ref 5–15)
BUN: 30 mg/dL — ABNORMAL HIGH (ref 8–23)
CO2: 24 mmol/L (ref 22–32)
Calcium: 9.4 mg/dL (ref 8.9–10.3)
Chloride: 107 mmol/L (ref 98–111)
Creatinine: 1.41 mg/dL — ABNORMAL HIGH (ref 0.61–1.24)
GFR, Estimated: 51 mL/min — ABNORMAL LOW (ref >60)
Glucose, Bld: 119 mg/dL — ABNORMAL HIGH (ref 70–99)
Potassium: 5 mmol/L (ref 3.5–5.1)
Sodium: 138 mmol/L (ref 135–145)

## 2023-05-29 LAB — CBC WITH DIFFERENTIAL (CANCER CENTER ONLY)
Abs Immature Granulocytes: 0.02 10*3/uL (ref 0.00–0.07)
Basophils Absolute: 0 10*3/uL (ref 0.0–0.1)
Basophils Relative: 1 %
Eosinophils Absolute: 0.2 10*3/uL (ref 0.0–0.5)
Eosinophils Relative: 3 %
HCT: 24.3 % — ABNORMAL LOW (ref 39.0–52.0)
Hemoglobin: 8.1 g/dL — ABNORMAL LOW (ref 13.0–17.0)
Immature Granulocytes: 0 %
Lymphocytes Relative: 29 %
Lymphs Abs: 1.4 10*3/uL (ref 0.7–4.0)
MCH: 33.6 pg (ref 26.0–34.0)
MCHC: 33.3 g/dL (ref 30.0–36.0)
MCV: 100.8 fL — ABNORMAL HIGH (ref 80.0–100.0)
Monocytes Absolute: 0.3 10*3/uL (ref 0.1–1.0)
Monocytes Relative: 7 %
Neutro Abs: 2.8 10*3/uL (ref 1.7–7.7)
Neutrophils Relative %: 60 %
Platelet Count: 196 10*3/uL (ref 150–400)
RBC: 2.41 MIL/uL — ABNORMAL LOW (ref 4.22–5.81)
RDW: 28.6 % — ABNORMAL HIGH (ref 11.5–15.5)
WBC Count: 4.7 10*3/uL (ref 4.0–10.5)
nRBC: 0.6 % — ABNORMAL HIGH (ref 0.0–0.2)

## 2023-05-29 LAB — FERRITIN: Ferritin: 761 ng/mL — ABNORMAL HIGH (ref 24–336)

## 2023-05-29 LAB — VITAMIN B12: Vitamin B-12: 665 pg/mL (ref 180–914)

## 2023-05-29 MED ORDER — DARBEPOETIN ALFA 300 MCG/0.6ML IJ SOSY
300.0000 ug | PREFILLED_SYRINGE | Freq: Once | INTRAMUSCULAR | Status: AC
  Administered 2023-05-29: 300 ug via SUBCUTANEOUS
  Filled 2023-05-29: qty 0.6

## 2023-05-29 NOTE — Progress Notes (Signed)
[facility] CONSULT NOTE  Patient Care Team: Gracelyn Nurse, MD as PCP - General (Internal Medicine) Lanier Prude, MD as PCP - Electrophysiology (Cardiology) Earna Coder, MD as Consulting Physician (Hematology and Oncology)  CHIEF COMPLAINTS/PURPOSE OF CONSULTATION: MDS  Oncology History Overview Note  # EGD-none; colonoscopy-never [Cologurad x2- NEG];FEB 2021- US- abdomen-no liver disease/ Mild splenic enlargement [460cc; kidney cysts]; B12 folic acid/LDH haptoglobin normal.  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  Alone.   Vernon [name] 77 y.o.  male with history of recently diagnosed low-grade MDS; A-fib on Eliquis currently on Retacrit is here for follow-up.   Recently his dose of Aranesp has been increased to 300 mcg. This is making him feel better and has been keeping his Hgb more stable. He would like to continue this dose if possible.   No complaints. Since his last office visit he has been doing fairly well.   Denies any bleeding or bruising. Fatigue is a bit improved. Appetite is  much better- he has subsequently gained weight. No peripheral edema.  Denies any nausea vomiting abdominal pain.  No weight loss.  No worsening shortness of breath or cough.  Wt Readings from Last 3 Encounters:  05/29/23 195 lb (88.5 kg)  05/08/23 189 lb 3.2 oz (85.8 kg)  04/24/23 198 lb (89.8 kg)     Review of Systems  Constitutional:  Negative for chills, diaphoresis, fever, malaise/fatigue and weight loss.  HENT:  Negative for nosebleeds and sore throat.   Eyes:  Negative for double vision.  Respiratory:  Negative for cough, hemoptysis, sputum production, shortness of breath and wheezing.   Cardiovascular:  Negative for chest pain, palpitations, orthopnea and leg swelling.  Gastrointestinal:  Negative for abdominal pain, blood in stool, constipation, diarrhea, heartburn, melena, nausea and vomiting.  Genitourinary:  Negative for dysuria, frequency and urgency.  Musculoskeletal:  Negative for back pain and joint pain.  Skin: Negative.  Negative for itching and rash.  Neurological:  Negative for dizziness, tingling, focal weakness, weakness and headaches.  Endo/Heme/Allergies:  Does not bruise/bleed easily.  Psychiatric/Behavioral:  Negative for depression. The patient is not nervous/anxious and does not have insomnia.     MEDICAL HISTORY:  Past Medical History:  Diagnosis Date   Anemia    Aortic stenosis    Arthritis    Complication of anesthesia    hard time getting bp up after knee replacement   Coronary artery disease    Diabetes mellitus without complication (HCC)    GERD (gastroesophageal reflux disease)    occ tums prn   History of hiatal hernia    Hypertension    MDS (myelodysplastic syndrome) (HCC)  Presence of Watchman left atrial appendage closure device 07/26/2022   27mm Watchman FLX with Dr. Lalla Brothers    SURGICAL HISTORY: Past Surgical History:  Procedure Laterality Date   CARPAL TUNNEL RELEASE  2012   CRANIOTOMY N/A 02/10/2022   Procedure: SUBOCCIPITAL  CRANIECTOMY FOR EVACUATION OF CEREBELLAR HEMATOMA;  Surgeon: Barnett Abu, MD;  Location: [facility] OR;  Service: Neurosurgery;  Laterality: N/A;   JOINT REPLACEMENT Right 2010   LEFT ATRIAL APPENDAGE OCCLUSION N/A 07/26/2022   Procedure: LEFT ATRIAL APPENDAGE OCCLUSION;  Surgeon: Lanier Prude, MD;  Location: [facility] INVASIVE CV LAB;  Service: Cardiovascular;  Laterality: N/A;   RIGHT/LEFT HEART CATH AND CORONARY ANGIOGRAPHY N/A 04/10/2023   Procedure: RIGHT/LEFT HEART CATH AND CORONARY ANGIOGRAPHY;  Surgeon: Marykay Lex, MD;  Location: [facility] INVASIVE CV LAB;  Service: Cardiovascular;  Laterality: N/A;   SHOULDER ARTHROSCOPY WITH ROTATOR CUFF REPAIR AND OPEN BICEPS TENODESIS Right 11/09/2019   Procedure: RIGHT SHOULDER ARTHROSCOPY WITH SUBSCAPULARIS REPAIR, SUBACROMIAL DECOMPRESSION,MINI OPEN ROTATOR CUFF REPAIR;  Surgeon: Signa Kell, MD;  Location: [facility] ORS;  Service: Orthopedics;  Laterality: Right;   TEE WITHOUT CARDIOVERSION N/A 07/26/2022   Procedure: TRANSESOPHAGEAL ECHOCARDIOGRAM (TEE);  Surgeon: Lanier Prude, MD;  Location: [facility] INVASIVE CV LAB;  Service: Cardiovascular;  Laterality: N/A;    SOCIAL HISTORY: Social History   Socioeconomic History   Marital status: Married    Spouse name: Not on file   Number of children: 2   Years of education: Not on file   Highest education level: Not on file  Occupational History   Occupation: Retired - [employer]  Tobacco Use   Smoking status: Former    Packs/day: 0.50    Years: 8.00    Additional pack years: 0.00    Total pack years: 4.00    Types: Cigarettes    Quit date: 07/21/1978    Years since quitting: 44.8   Smokeless tobacco: Never  Vaping Use   Vaping Use: Never used  Substance and Sexual Activity   Alcohol use: Yes    Alcohol/week: 0.0 - 1.0 standard drinks of alcohol    Comment: rare beer   Drug use: Never   Sexual activity: Not on file  Other Topics Concern   Not on file  Social History Narrative   Lives in  [place]; with wife; quit smoking in early 12s; ocassional/ rare [may be 1 a month beer]. 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    Social Determinants of Health   Financial Resource Strain: Not on file  Food Insecurity: No Food Insecurity (12/18/2022)   Hunger Vital Sign    Worried About Running Out of Food in the Last Year: Never true    Ran Out of Food in the Last Year: Never true  Transportation Needs: No Transportation Needs (12/18/2022)   PRAPARE - Administrator, Civil Service (Medical): No    Lack of Transportation (Non-Medical): No  Physical Activity: Not on file  Stress: Not on file  Social Connections: Not on file  Intimate Partner Violence: Not At Risk (12/18/2022)   Humiliation, Afraid, Rape, and Kick questionnaire    Fear of Current or Ex-Partner: No    Emotionally Abused: No    Physically Abused: No    Sexually Abused: No    FAMILY HISTORY: Family History  Problem Relation Age of Onset   Cancer Maternal Uncle     ALLERGIES:  has No Known Allergies.  MEDICATIONS:  Current Outpatient Medications  Medication Sig Dispense Refill  aspirin EC 81 MG tablet Take 1 tablet (81 mg total) by mouth daily. Swallow whole. 90 tablet 3   ezetimibe (ZETIA) 10 MG tablet Take 1 tablet (10 mg total) by mouth daily. 90 tablet 3   furosemide (LASIX) 20 MG tablet Take 0.5 tablets (10 mg total) by mouth daily. May take extra tablet if needed 30 tablet 3   metFORMIN (GLUCOPHAGE) 500 MG tablet Take 500 mg by mouth 2 (two) times daily with a meal.     Multiple Vitamin (MULTI-VITAMIN) tablet Take 1 tablet by mouth daily.     No current facility-administered medications for this visit.   PHYSICAL EXAMINATION: Vitals:   05/29/23 1026  BP: 130/61  Pulse: (!) 48  Resp: 18  Temp: (!) 97.2 F (36.2 C)   Filed Weights   05/29/23 1026  Weight: 195 lb (88.5 kg)    Physical Exam Constitutional:      Comments: Sitting comfortably in wheelchair. Accompanied by wife    HENT:     Head: Normocephalic and atraumatic.     Mouth/Throat:     Pharynx: No oropharyngeal exudate.  Eyes:     Pupils: Pupils are equal, round, and reactive to light.  Cardiovascular:     Rate and Rhythm: Normal rate and regular rhythm.  Pulmonary:     Effort: Pulmonary effort is normal. No respiratory distress.     Breath sounds: Normal breath sounds. No wheezing.  Abdominal:     General: There is no distension.     Palpations: Abdomen is soft. There is no mass.     Tenderness: There is no abdominal tenderness. There is no guarding or rebound.  Musculoskeletal:        General: No tenderness. Normal range of motion.     Cervical back: Normal range of motion and neck supple.  Skin:    General: Skin is warm.  Neurological:     Mental Status: He is alert and oriented to person, place, and time.  Psychiatric:        Mood and Affect: Affect normal.    LABORATORY DATA:  I have reviewed the data as listed Lab Results  Component Value Date   WBC 4.7 05/29/2023   HGB 8.1 (L) 05/29/2023   HCT 24.3 (L) 05/29/2023   MCV 100.8 (H) 05/29/2023   PLT 196 05/29/2023   Recent Labs    12/17/22 2041 12/18/22 0556 12/26/22 1119 01/09/23 1008 03/06/23 0953 03/25/23 1422 04/10/23 1319 04/10/23 1330 04/15/23 1020 05/29/23 1013  NA 135   < >  --  140 137 144   < > 142 140 138  K 4.5   < >  --  4.5 4.2 5.3*   < > 4.0 5.0 5.0  CL 100   < >  --  106 106 105  --   --  103 107  CO2 23   < >  --  25 24 23   --   --  22 24  GLUCOSE 113*   < >  --  150* 129* 114*  --   --  118* 119*  BUN 47*   < >  --  25* 23 23  --   --  28* 30*  CREATININE 1.75*   < >  --  1.25* 1.21 1.57*  --   --  1.62* 1.41*  CALCIUM 9.3   < >  --  9.2 9.1 10.1  --   --  9.5 9.4  GFRNONAA 40*   < >  --  60* >60  --   --   --   --  51*  PROT 8.3*   < > 6.8 7.6  --  7.5  --   --   --   --   ALBUMIN 4.4   < > 4.4 4.1  --  4.8  --   --   --   --   AST 102*   < > 48* 29  --  21  --   --   --   --   ALT 105*   < > 47*  21  --  15  --   --   --   --   ALKPHOS 284*   < > 397* 169*  --  117  --   --   --   --   BILITOT 13.1*   < > 3.6* 2.3*  --  2.2*  --   --   --   --   BILIDIR 7.1*  --  1.83* 0.7*  --   --   --   --   --   --   IBILI  --   --   --  1.6*  --   --   --   --   --   --    < > = values in this interval not displayed.   Assessment and Plan:  MDS (myelodysplastic syndrome) (HCC) #Low-grade myelodysplastic syndrome-refractory anemia with ringed sideroblasts.POSITIVE for SF3B1.  Patient currently on Retacrit [since April 2021]; hemoglobin between 8-9.  Patient is fairly asymptomatic. Stable. Continue Retacrit every 2 weeks.   Anemia secondary to CKD- Today hemoglobin is 8.1- stable Continue Aranesp for now.  OCT 2023- B12 790; Ferritin-826  [recently started Fe/B12]  Okay to come off of oral iron.  Unlikely Aranesp to affect his bilirubin.    HTN- at home in 140s. Today BP is 130/61. Stable   Intracranial bleed while on Eliquis/Hx of stroke s/p evacuation- [at home > 2 months]- OFF ELIQUIS; on asprin.  stable   Hx Of A.fib [OFF eliquis secondary to intracranial bleed]- s/p watchman device- on asprin ONLY- stable   Stage kidney disease-stage III [dr.Korrapati]- Creatinine today is 1.41 with GFR of 51. Stable   Elevated bilirubin- [JAN 2024-10.7] liver suggesting underlying cirrhosis. No signs of biliary tract obstruction vs-gallbladder sludge.? Enzyme deficient- triggered by stress/UTI.  If recurrent/worse would recommend GI evaluation [Dr.Vanga]. Bilirubin from 05/27/2023 was 3.2. Stable. Continue to monitor.    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 Repeat abdominal MRI with and without IV gadolinium in JAN 2025.    Numerous tiny T2 hyperintensities scattered throughout the Spleen- benign lesions  small cavernous hemangiomas or lymphangioma-    DISPOSITION:  Aranesp today RTC 2,4, 6  weeks labs(CBC, CMP), possible aranesp RTC 8 weeks MD, labs( CBC, CMP, iron, ferritin, B12), ipossible aranesp-[place]     Rushie Chestnut, PA-C 05/29/2023 11:19 AM

## 2023-05-29 NOTE — Addendum Note (Signed)
Addended by: Darrold Span A on: 05/29/2023 11:33 AM   Modules accepted: Orders

## 2023-06-03 ENCOUNTER — Ambulatory Visit: Payer: Medicare Other

## 2023-06-03 ENCOUNTER — Encounter: Payer: Medicare Other | Admitting: Occupational Therapy

## 2023-06-05 ENCOUNTER — Ambulatory Visit: Payer: Medicare Other

## 2023-06-10 ENCOUNTER — Ambulatory Visit: Payer: Medicare Other

## 2023-06-11 ENCOUNTER — Telehealth: Payer: Self-pay | Admitting: Cardiovascular Disease

## 2023-06-11 NOTE — Telephone Encounter (Signed)
Wife stated patient had lab tests done on 7/1 which are now available for Dr. Marylene Buerger review.

## 2023-06-11 NOTE — Telephone Encounter (Signed)
Spoke with pt's wife, [name] (ok per DPR) regarding pt's recent lab work. Wife states that Dr. Flora Lipps wanted to see lipid panel to see how medications are working for him. Informed wife that I would forward him this message to review labs. Wife verbalizes understanding.

## 2023-06-12 ENCOUNTER — Inpatient Hospital Stay: Payer: Medicare Other

## 2023-06-12 ENCOUNTER — Ambulatory Visit: Payer: Medicare Other

## 2023-06-12 VITALS — BP 132/78 | HR 48

## 2023-06-12 DIAGNOSIS — D469 Myelodysplastic syndrome, unspecified: Secondary | ICD-10-CM

## 2023-06-12 DIAGNOSIS — N183 Chronic kidney disease, stage 3 unspecified: Secondary | ICD-10-CM | POA: Diagnosis not present

## 2023-06-12 LAB — HEMOGLOBIN AND HEMATOCRIT (CANCER CENTER ONLY)
HCT: 26.7 % — ABNORMAL LOW (ref 39.0–52.0)
Hemoglobin: 8.7 g/dL — ABNORMAL LOW (ref 13.0–17.0)

## 2023-06-12 MED ORDER — DARBEPOETIN ALFA 300 MCG/0.6ML IJ SOSY
300.0000 ug | PREFILLED_SYRINGE | Freq: Once | INTRAMUSCULAR | Status: AC
  Administered 2023-06-12: 300 ug via SUBCUTANEOUS
  Filled 2023-06-12: qty 0.6

## 2023-06-13 NOTE — Telephone Encounter (Signed)
Spoke to patient's wife - [name] has been notified of the result and verbalized understanding.  All questions (if any) were answered. Tobin Chad, RN 06/13/2023 9:23 AM

## 2023-06-13 NOTE — Telephone Encounter (Signed)
Cholesterol level is perfect. Continue zetia.  Gerri Spore T. Flora Lipps, MD, [facility]  [facility] [address], [address] [place], [place] [address] [phone] 5:25 AM

## 2023-06-17 ENCOUNTER — Ambulatory Visit: Payer: Medicare Other

## 2023-06-19 ENCOUNTER — Ambulatory Visit: Payer: Medicare Other

## 2023-06-24 ENCOUNTER — Ambulatory Visit: Payer: Medicare Other

## 2023-06-25 ENCOUNTER — Other Ambulatory Visit: Payer: Self-pay | Admitting: *Deleted

## 2023-06-25 DIAGNOSIS — D469 Myelodysplastic syndrome, unspecified: Secondary | ICD-10-CM

## 2023-06-26 ENCOUNTER — Inpatient Hospital Stay: Payer: Medicare Other

## 2023-06-26 ENCOUNTER — Ambulatory Visit: Payer: Medicare Other

## 2023-06-26 VITALS — BP 133/62

## 2023-06-26 DIAGNOSIS — D469 Myelodysplastic syndrome, unspecified: Secondary | ICD-10-CM

## 2023-06-26 DIAGNOSIS — N183 Chronic kidney disease, stage 3 unspecified: Secondary | ICD-10-CM | POA: Diagnosis not present

## 2023-06-26 LAB — CBC WITH DIFFERENTIAL (CANCER CENTER ONLY)
Abs Immature Granulocytes: 0.01 10*3/uL (ref 0.00–0.07)
Basophils Absolute: 0 10*3/uL (ref 0.0–0.1)
Basophils Relative: 1 %
Eosinophils Absolute: 0.1 10*3/uL (ref 0.0–0.5)
Eosinophils Relative: 3 %
HCT: 26.5 % — ABNORMAL LOW (ref 39.0–52.0)
Hemoglobin: 8.8 g/dL — ABNORMAL LOW (ref 13.0–17.0)
Immature Granulocytes: 0 %
Lymphocytes Relative: 26 %
Lymphs Abs: 1.2 10*3/uL (ref 0.7–4.0)
MCH: 34.2 pg — ABNORMAL HIGH (ref 26.0–34.0)
MCHC: 33.2 g/dL (ref 30.0–36.0)
MCV: 103.1 fL — ABNORMAL HIGH (ref 80.0–100.0)
Monocytes Absolute: 0.4 10*3/uL (ref 0.1–1.0)
Monocytes Relative: 8 %
Neutro Abs: 2.7 10*3/uL (ref 1.7–7.7)
Neutrophils Relative %: 62 %
Platelet Count: 183 10*3/uL (ref 150–400)
RBC: 2.57 MIL/uL — ABNORMAL LOW (ref 4.22–5.81)
RDW: 28.4 % — ABNORMAL HIGH (ref 11.5–15.5)
WBC Count: 4.4 10*3/uL (ref 4.0–10.5)
nRBC: 0.5 % — ABNORMAL HIGH (ref 0.0–0.2)

## 2023-06-26 LAB — BASIC METABOLIC PANEL - CANCER CENTER ONLY
Anion gap: 6 (ref 5–15)
BUN: 29 mg/dL — ABNORMAL HIGH (ref 8–23)
CO2: 25 mmol/L (ref 22–32)
Calcium: 9.4 mg/dL (ref 8.9–10.3)
Chloride: 107 mmol/L (ref 98–111)
Creatinine: 1.27 mg/dL — ABNORMAL HIGH (ref 0.61–1.24)
GFR, Estimated: 58 mL/min — ABNORMAL LOW (ref >60)
Glucose, Bld: 124 mg/dL — ABNORMAL HIGH (ref 70–99)
Potassium: 4.9 mmol/L (ref 3.5–5.1)
Sodium: 138 mmol/L (ref 135–145)

## 2023-06-26 MED ORDER — DARBEPOETIN ALFA 300 MCG/0.6ML IJ SOSY
300.0000 ug | PREFILLED_SYRINGE | Freq: Once | INTRAMUSCULAR | Status: AC
  Administered 2023-06-26: 300 ug via SUBCUTANEOUS
  Filled 2023-06-26: qty 0.6

## 2023-07-01 ENCOUNTER — Ambulatory Visit: Payer: Medicare Other

## 2023-07-03 ENCOUNTER — Telehealth: Payer: Self-pay

## 2023-07-03 ENCOUNTER — Ambulatory Visit: Payer: Medicare Other

## 2023-07-03 NOTE — Telephone Encounter (Signed)
Called to check in with patient, who had LAAO on 07/27/2023. The patient reports doing well with no issues.  The patient understands to call with questions or concerns.  He was grateful for call.

## 2023-07-08 ENCOUNTER — Ambulatory Visit: Payer: Medicare Other

## 2023-07-10 ENCOUNTER — Ambulatory Visit: Payer: Medicare Other

## 2023-07-10 ENCOUNTER — Inpatient Hospital Stay: Payer: Medicare Other

## 2023-07-10 ENCOUNTER — Inpatient Hospital Stay: Payer: Medicare Other | Attending: Internal Medicine

## 2023-07-10 VITALS — BP 146/64

## 2023-07-10 DIAGNOSIS — I129 Hypertensive chronic kidney disease with stage 1 through stage 4 chronic kidney disease, or unspecified chronic kidney disease: Secondary | ICD-10-CM | POA: Insufficient documentation

## 2023-07-10 DIAGNOSIS — E1122 Type 2 diabetes mellitus with diabetic chronic kidney disease: Secondary | ICD-10-CM | POA: Insufficient documentation

## 2023-07-10 DIAGNOSIS — N183 Chronic kidney disease, stage 3 unspecified: Secondary | ICD-10-CM | POA: Diagnosis not present

## 2023-07-10 DIAGNOSIS — D631 Anemia in chronic kidney disease: Secondary | ICD-10-CM | POA: Insufficient documentation

## 2023-07-10 DIAGNOSIS — D539 Nutritional anemia, unspecified: Secondary | ICD-10-CM

## 2023-07-10 DIAGNOSIS — D4621 Refractory anemia with excess of blasts 1: Secondary | ICD-10-CM | POA: Insufficient documentation

## 2023-07-10 DIAGNOSIS — D469 Myelodysplastic syndrome, unspecified: Secondary | ICD-10-CM

## 2023-07-10 LAB — CMP (CANCER CENTER ONLY)
ALT: 16 U/L (ref 0–44)
AST: 20 U/L (ref 15–41)
Albumin: 4.6 g/dL (ref 3.5–5.0)
Alkaline Phosphatase: 63 U/L (ref 38–126)
Anion gap: 9 (ref 5–15)
BUN: 22 mg/dL (ref 8–23)
CO2: 24 mmol/L (ref 22–32)
Calcium: 9.2 mg/dL (ref 8.9–10.3)
Chloride: 105 mmol/L (ref 98–111)
Creatinine: 1.17 mg/dL (ref 0.61–1.24)
GFR, Estimated: >60 mL/min (ref >60)
Glucose, Bld: 116 mg/dL — ABNORMAL HIGH (ref 70–99)
Potassium: 4.6 mmol/L (ref 3.5–5.1)
Sodium: 138 mmol/L (ref 135–145)
Total Bilirubin: 3.7 mg/dL (ref 0.3–1.2)
Total Protein: 7.3 g/dL (ref 6.5–8.1)

## 2023-07-10 LAB — CBC WITH DIFFERENTIAL (CANCER CENTER ONLY)
Abs Immature Granulocytes: 0.02 10*3/uL (ref 0.00–0.07)
Basophils Absolute: 0 10*3/uL (ref 0.0–0.1)
Basophils Relative: 1 %
Eosinophils Absolute: 0.1 10*3/uL (ref 0.0–0.5)
Eosinophils Relative: 3 %
HCT: 27.7 % — ABNORMAL LOW (ref 39.0–52.0)
Hemoglobin: 9.2 g/dL — ABNORMAL LOW (ref 13.0–17.0)
Immature Granulocytes: 1 %
Lymphocytes Relative: 27 %
Lymphs Abs: 1.1 10*3/uL (ref 0.7–4.0)
MCH: 34.2 pg — ABNORMAL HIGH (ref 26.0–34.0)
MCHC: 33.2 g/dL (ref 30.0–36.0)
MCV: 103 fL — ABNORMAL HIGH (ref 80.0–100.0)
Monocytes Absolute: 0.4 10*3/uL (ref 0.1–1.0)
Monocytes Relative: 9 %
Neutro Abs: 2.4 10*3/uL (ref 1.7–7.7)
Neutrophils Relative %: 59 %
Platelet Count: 186 10*3/uL (ref 150–400)
RBC: 2.69 MIL/uL — ABNORMAL LOW (ref 4.22–5.81)
RDW: 27.9 % — ABNORMAL HIGH (ref 11.5–15.5)
Smear Review: NORMAL
WBC Count: 4.1 10*3/uL (ref 4.0–10.5)
nRBC: 0 % (ref 0.0–0.2)

## 2023-07-10 MED ORDER — DARBEPOETIN ALFA 300 MCG/0.6ML IJ SOSY
300.0000 ug | PREFILLED_SYRINGE | Freq: Once | INTRAMUSCULAR | Status: AC
  Administered 2023-07-10: 300 ug via SUBCUTANEOUS
  Filled 2023-07-10: qty 0.6

## 2023-07-10 NOTE — Progress Notes (Signed)
Critical Total Bilirubin of 3.7 called by Gershon Mussel to this staff nurse

## 2023-07-15 ENCOUNTER — Ambulatory Visit: Payer: Medicare Other

## 2023-07-17 ENCOUNTER — Ambulatory Visit: Payer: Medicare Other

## 2023-07-22 ENCOUNTER — Ambulatory Visit: Payer: Medicare Other

## 2023-07-24 ENCOUNTER — Inpatient Hospital Stay: Payer: Medicare Other

## 2023-07-24 ENCOUNTER — Other Ambulatory Visit: Payer: Self-pay

## 2023-07-24 ENCOUNTER — Ambulatory Visit: Payer: Medicare Other

## 2023-07-24 ENCOUNTER — Encounter: Payer: Self-pay | Admitting: Medical Oncology

## 2023-07-24 ENCOUNTER — Inpatient Hospital Stay: Payer: Medicare Other | Admitting: Medical Oncology

## 2023-07-24 VITALS — BP 130/74 | HR 59 | Temp 97.2°F | Wt 190.0 lb

## 2023-07-24 DIAGNOSIS — D539 Nutritional anemia, unspecified: Secondary | ICD-10-CM

## 2023-07-24 DIAGNOSIS — I10 Essential (primary) hypertension: Secondary | ICD-10-CM

## 2023-07-24 DIAGNOSIS — D4621 Refractory anemia with excess of blasts 1: Secondary | ICD-10-CM | POA: Diagnosis not present

## 2023-07-24 DIAGNOSIS — D469 Myelodysplastic syndrome, unspecified: Secondary | ICD-10-CM | POA: Diagnosis not present

## 2023-07-24 DIAGNOSIS — N1832 Chronic kidney disease, stage 3b: Secondary | ICD-10-CM | POA: Diagnosis not present

## 2023-07-24 DIAGNOSIS — R17 Unspecified jaundice: Secondary | ICD-10-CM

## 2023-07-24 LAB — CBC WITH DIFFERENTIAL/PLATELET
Abs Immature Granulocytes: 0.03 10*3/uL (ref 0.00–0.07)
Basophils Absolute: 0 10*3/uL (ref 0.0–0.1)
Basophils Relative: 1 %
Eosinophils Absolute: 0.1 10*3/uL (ref 0.0–0.5)
Eosinophils Relative: 2 %
HCT: 28.2 % — ABNORMAL LOW (ref 39.0–52.0)
Hemoglobin: 9.3 g/dL — ABNORMAL LOW (ref 13.0–17.0)
Immature Granulocytes: 1 %
Lymphocytes Relative: 20 %
Lymphs Abs: 1.1 10*3/uL (ref 0.7–4.0)
MCH: 33.6 pg (ref 26.0–34.0)
MCHC: 33 g/dL (ref 30.0–36.0)
MCV: 101.8 fL — ABNORMAL HIGH (ref 80.0–100.0)
Monocytes Absolute: 0.4 10*3/uL (ref 0.1–1.0)
Monocytes Relative: 7 %
Neutro Abs: 3.9 10*3/uL (ref 1.7–7.7)
Neutrophils Relative %: 69 %
Platelets: 218 10*3/uL (ref 150–400)
RBC: 2.77 MIL/uL — ABNORMAL LOW (ref 4.22–5.81)
RDW: 26.9 % — ABNORMAL HIGH (ref 11.5–15.5)
WBC: 5.6 10*3/uL (ref 4.0–10.5)
nRBC: 0 % (ref 0.0–0.2)

## 2023-07-24 LAB — COMPREHENSIVE METABOLIC PANEL
ALT: 14 U/L (ref 0–44)
AST: 21 U/L (ref 15–41)
Albumin: 4.7 g/dL (ref 3.5–5.0)
Alkaline Phosphatase: 63 U/L (ref 38–126)
Anion gap: 6 (ref 5–15)
BUN: 26 mg/dL — ABNORMAL HIGH (ref 8–23)
CO2: 26 mmol/L (ref 22–32)
Calcium: 9.5 mg/dL (ref 8.9–10.3)
Chloride: 109 mmol/L (ref 98–111)
Creatinine, Ser: 1.28 mg/dL — ABNORMAL HIGH (ref 0.61–1.24)
GFR, Estimated: 58 mL/min — ABNORMAL LOW (ref >60)
Glucose, Bld: 120 mg/dL — ABNORMAL HIGH (ref 70–99)
Potassium: 4.4 mmol/L (ref 3.5–5.1)
Sodium: 141 mmol/L (ref 135–145)
Total Bilirubin: 3.3 mg/dL — ABNORMAL HIGH (ref 0.3–1.2)
Total Protein: 7.3 g/dL (ref 6.5–8.1)

## 2023-07-24 LAB — IRON AND TIBC
Iron: 113 ug/dL (ref 45–182)
Saturation Ratios: 41 % — ABNORMAL HIGH (ref 17.9–39.5)
TIBC: 276 ug/dL (ref 250–450)
UIBC: 163 ug/dL

## 2023-07-24 LAB — VITAMIN B12: Vitamin B-12: 879 pg/mL (ref 180–914)

## 2023-07-24 LAB — FERRITIN: Ferritin: 827 ng/mL — ABNORMAL HIGH (ref 24–336)

## 2023-07-24 MED ORDER — DARBEPOETIN ALFA 300 MCG/0.6ML IJ SOSY
300.0000 ug | PREFILLED_SYRINGE | Freq: Once | INTRAMUSCULAR | Status: AC
  Administered 2023-07-24: 300 ug via SUBCUTANEOUS
  Filled 2023-07-24: qty 0.6

## 2023-07-24 NOTE — Addendum Note (Signed)
Addended by: Alinda Deem H on: 07/24/2023 03:01 PM   Modules accepted: Orders

## 2023-07-24 NOTE — Progress Notes (Signed)
[facility] Office Visit Follow Up Note  Patient Care Team: Gracelyn Nurse, MD as PCP - General (Internal Medicine) Lanier Prude, MD as PCP - Electrophysiology (Cardiology) Earna Coder, MD as Consulting Physician (Hematology and Oncology)  CHIEF COMPLAINTS/PURPOSE OF CONSULTATION: MDS  Oncology History Overview Note  # EGD-none; colonoscopy-never [Cologurad x2- NEG];FEB 2021- US- abdomen-no liver disease/ Mild splenic enlargement [460cc; kidney cysts]; B12 folic acid/LDH haptoglobin normal.  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  Alone.   Vernon [name] 77 y.o.  male with history of recently diagnosed low-grade MDS; A-fib on Eliquis currently on Retacrit 300 mcg is here for follow-up.   He is here with his wife today. They report that he is doing well overall. Energy has picked up which they are both happy about.   No complaints. Since his last office visit he has been doing fairly well.   Denies any bleeding or bruising. Fatigue is a bit improved. Appetite is much better- he has  subsequently gained weight. No peripheral edema.  Denies any nausea vomiting abdominal pain.  No weight loss.  No worsening shortness of breath or cough.  Wt Readings from Last 3 Encounters:  07/24/23 190 lb (86.2 kg)  05/29/23 195 lb (88.5 kg)  05/08/23 189 lb 3.2 oz (85.8 kg)     Review of Systems  Constitutional:  Negative for chills, diaphoresis, fever, malaise/fatigue and weight loss.  HENT:  Negative for nosebleeds and sore throat.   Eyes:  Negative for double vision.  Respiratory:  Negative for cough, hemoptysis, sputum production, shortness of breath and wheezing.   Cardiovascular:  Negative for chest pain, palpitations, orthopnea and leg swelling.  Gastrointestinal:  Negative for abdominal pain, blood in stool, constipation, diarrhea, heartburn, melena, nausea and vomiting.  Genitourinary:  Negative for dysuria, frequency and urgency.  Musculoskeletal:  Negative for back pain and joint pain.  Skin: Negative.  Negative for itching and rash.  Neurological:  Negative for dizziness, tingling, focal weakness, weakness and headaches.  Endo/Heme/Allergies:  Does not bruise/bleed easily.  Psychiatric/Behavioral:  Negative for depression. The patient is not nervous/anxious and does not have insomnia.     MEDICAL HISTORY:  Past Medical History:  Diagnosis Date   Anemia    Aortic stenosis    Arthritis    Complication of anesthesia    hard time getting bp up after knee replacement   Coronary artery disease    Diabetes mellitus without complication (HCC)    GERD (gastroesophageal reflux disease)    occ tums prn   History of hiatal hernia    Hypertension    MDS (myelodysplastic syndrome) (HCC)    Presence of Watchman left  atrial appendage closure device 07/26/2022   27mm Watchman FLX with Dr. Lalla Brothers    SURGICAL HISTORY: Past Surgical History:  Procedure Laterality Date   CARPAL TUNNEL RELEASE  2012   CRANIOTOMY N/A 02/10/2022   Procedure: SUBOCCIPITAL CRANIECTOMY FOR  EVACUATION OF CEREBELLAR HEMATOMA;  Surgeon: Barnett Abu, MD;  Location: [facility] OR;  Service: Neurosurgery;  Laterality: N/A;   JOINT REPLACEMENT Right 2010   LEFT ATRIAL APPENDAGE OCCLUSION N/A 07/26/2022   Procedure: LEFT ATRIAL APPENDAGE OCCLUSION;  Surgeon: Lanier Prude, MD;  Location: [facility] INVASIVE CV LAB;  Service: Cardiovascular;  Laterality: N/A;   RIGHT/LEFT HEART CATH AND CORONARY ANGIOGRAPHY N/A 04/10/2023   Procedure: RIGHT/LEFT HEART CATH AND CORONARY ANGIOGRAPHY;  Surgeon: Marykay Lex, MD;  Location: [facility] INVASIVE CV LAB;  Service: Cardiovascular;  Laterality: N/A;   SHOULDER ARTHROSCOPY WITH ROTATOR CUFF REPAIR AND OPEN BICEPS TENODESIS Right 11/09/2019   Procedure: RIGHT SHOULDER ARTHROSCOPY WITH SUBSCAPULARIS REPAIR, SUBACROMIAL DECOMPRESSION,MINI OPEN ROTATOR CUFF REPAIR;  Surgeon: Signa Kell, MD;  Location: [facility] ORS;  Service: Orthopedics;  Laterality: Right;   TEE WITHOUT CARDIOVERSION N/A 07/26/2022   Procedure: TRANSESOPHAGEAL ECHOCARDIOGRAM (TEE);  Surgeon: Lanier Prude, MD;  Location: [facility] INVASIVE CV LAB;  Service: Cardiovascular;  Laterality: N/A;    SOCIAL HISTORY: Social History   Socioeconomic History   Marital status: Married    Spouse name: Not on file   Number of children: 2   Years of education: Not on file   Highest education level: Not on file  Occupational History   Occupation: Retired - Academic librarian  Tobacco Use   Smoking status: Former    Current packs/day: 0.00    Average packs/day: 0.5 packs/day for 8.0 years (4.0 ttl pk-yrs)    Types: Cigarettes    Start date: 07/21/1970    Quit date: 07/21/1978    Years since quitting: 45.0   Smokeless tobacco: Never  Vaping Use   Vaping status: Never Used  Substance and Sexual Activity   Alcohol use: Yes    Alcohol/week: 0.0 - 1.0 standard drinks of alcohol    Comment: rare beer   Drug use: Never   Sexual activity: Not on file  Other Topics Concern   Not on file  Social History Narrative   Lives  in [place]; with wife; quit smoking in early 71s; ocassional/ rare [may be 1 a month beer]. 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    Social Determinants of Health   Financial Resource Strain: Not on file  Food Insecurity: No Food Insecurity (12/18/2022)   Hunger Vital Sign    Worried About Running Out of Food in the Last Year: Never true    Ran Out of Food in the Last Year: Never true  Transportation Needs: No Transportation Needs (12/18/2022)   PRAPARE - Administrator, Civil Service (Medical): No    Lack of Transportation (Non-Medical): No  Physical Activity: Not on file  Stress: Not on file  Social Connections: Not on file  Intimate Partner Violence: Not At Risk (12/18/2022)   Humiliation, Afraid, Rape, and Kick questionnaire    Fear of Current or Ex-Partner: No    Emotionally Abused: No    Physically Abused: No    Sexually Abused: No    FAMILY HISTORY: Family History  Problem Relation Age of Onset   Cancer Maternal Uncle     ALLERGIES:  has No Known Allergies.  MEDICATIONS:  Current Outpatient Medications  Medication Sig Dispense Refill   aspirin EC  81 MG tablet Take 1 tablet (81 mg total) by mouth daily. Swallow whole. 90 tablet 3   ezetimibe (ZETIA) 10 MG tablet Take 1 tablet (10 mg total) by mouth daily. 90 tablet 3   furosemide (LASIX) 20 MG tablet Take 0.5 tablets (10 mg total) by mouth daily. May take extra tablet if needed 30 tablet 3   metFORMIN (GLUCOPHAGE) 500 MG tablet Take 500 mg by mouth 2 (two) times daily with a meal.     Multiple Vitamin (MULTI-VITAMIN) tablet Take 1 tablet by mouth daily.     No current facility-administered medications for this visit.   Facility-Administered Medications Ordered in Other Visits  Medication Dose Route Frequency Provider Last Rate Last Admin   Darbepoetin Alfa (ARANESP) injection 300 mcg  300 mcg Subcutaneous Once Earna Coder, MD       PHYSICAL EXAMINATION: Vitals:   07/24/23 0955  BP:  130/74  Pulse: (!) 59  Temp: (!) 97.2 F (36.2 C)    Filed Weights   07/24/23 0955  Weight: 190 lb (86.2 kg)     Physical Exam Constitutional:      Comments: Sitting comfortably in wheelchair. Accompanied by wife   HENT:     Head: Normocephalic and atraumatic.     Mouth/Throat:     Pharynx: No oropharyngeal exudate.  Eyes:     Pupils: Pupils are equal, round, and reactive to light.  Cardiovascular:     Rate and Rhythm: Normal rate and regular rhythm.  Pulmonary:     Effort: Pulmonary effort is normal. No respiratory distress.     Breath sounds: Normal breath sounds. No wheezing.  Abdominal:     General: There is no distension.     Palpations: Abdomen is soft. There is no mass.     Tenderness: There is no abdominal tenderness. There is no guarding or rebound.  Musculoskeletal:        General: No tenderness. Normal range of motion.     Cervical back: Normal range of motion and neck supple.  Skin:    General: Skin is warm.  Neurological:     Mental Status: He is alert and oriented to person, place, and time.  Psychiatric:        Mood and Affect: Affect normal.    LABORATORY DATA:  I have reviewed the data as listed Lab Results  Component Value Date   WBC 5.6 07/24/2023   HGB 9.3 (L) 07/24/2023   HCT 28.2 (L) 07/24/2023   MCV 101.8 (H) 07/24/2023   PLT 218 07/24/2023   Recent Labs    12/17/22 2041 12/18/22 0556 12/26/22 1119 01/09/23 1008 03/06/23 0953 03/25/23 1422 04/10/23 1319 06/26/23 1054 07/10/23 1111 07/24/23 0940  NA 135   < >  --  140   < > 144   < > 138 138 141  K 4.5   < >  --  4.5   < > 5.3*   < > 4.9 4.6 4.4  CL 100   < >  --  106   < > 105   < > 107 105 109  CO2 23   < >  --  25   < > 23   < > 25 24 26   GLUCOSE 113*   < >  --  150*   < > 114*   < > 124* 116* 120*  BUN 47*   < >  --  25*   < > 23   < > 29*  22 26*  CREATININE 1.75*   < >  --  1.25*   < > 1.57*   < > 1.27* 1.17 1.28*  CALCIUM 9.3   < >  --  9.2   < > 10.1   < > 9.4 9.2 9.5   GFRNONAA 40*   < >  --  60*   < >  --    < > 58* >60 58*  PROT 8.3*   < > 6.8 7.6  --  7.5  --   --  7.3 7.3  ALBUMIN 4.4   < > 4.4 4.1  --  4.8  --   --  4.6 4.7  AST 102*   < > 48* 29  --  21  --   --  20 21  ALT 105*   < > 47* 21  --  15  --   --  16 14  ALKPHOS 284*   < > 397* 169*  --  117  --   --  63 63  BILITOT 13.1*   < > 3.6* 2.3*  --  2.2*  --   --  3.7* 3.3*  BILIDIR 7.1*  --  1.83* 0.7*  --   --   --   --   --   --   IBILI  --   --   --  1.6*  --   --   --   --   --   --    < > = values in this interval not displayed.   Assessment and Plan:  MDS (myelodysplastic syndrome) (HCC) #Low-grade myelodysplastic syndrome-refractory anemia with ringed sideroblasts.POSITIVE for SF3B1.  Patient currently on Retacrit [since April 2021]; hemoglobin between 8-9.  Symptomatically improved on 300 mcg every 2 week dose of EPO.  Stable. Continue Retacrit every 2 weeks.    Anemia secondary to CKD- Today hemoglobin is 9.3- stable Continue Aranesp for now.  B12 and iron studies pending.    HTN- at home in 140s. Today BP is 130/74. Stable   Intracranial bleed while on Eliquis/Hx of stroke OFF ELIQUIS; on asprin.  stable   Hx Of A.fib [OFF eliquis secondary to intracranial bleed]- s/p watchman device- on asprin ONLY- stable   Stage kidney disease-stage III [dr.Korrapati]- Creatinine today is 1.28 with GFR of 58. Stable   Elevated bilirubin- [JAN 2024-10.7] liver suggesting underlying cirrhosis. No signs of biliary tract obstruction vs-gallbladder sludge.? Enzyme deficient- triggered by stress/UTI.  If recurrent/worse would recommend GI evaluation [Dr.Vanga]. Bilirubin today is 3.3 down from 3.7. Continue to monitor.    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 Plan per Dr. Donneta Romberg is to repeat abdominal MRI with and without IV gadolinium in JAN 2025.    Numerous tiny T2 hyperintensities  scattered throughout the Spleen- benign lesions  small cavernous hemangiomas or lymphangioma-    DISPOSITION:  Aranesp today RTC 2,4, 6 weeks labs(CBC, CMP), possible aranesp RTC 8 weeks MD, labs( CBC, CMP, iron, ferritin, B12), ipossible aranesp-[place]     Rushie Chestnut, PA-C 07/24/2023 10:33 AM

## 2023-07-31 ENCOUNTER — Ambulatory Visit: Payer: Medicare Other

## 2023-07-31 ENCOUNTER — Encounter: Payer: Medicare Other | Admitting: Occupational Therapy

## 2023-08-05 ENCOUNTER — Ambulatory Visit: Payer: Medicare Other

## 2023-08-07 ENCOUNTER — Inpatient Hospital Stay: Payer: Medicare Other

## 2023-08-07 ENCOUNTER — Ambulatory Visit: Payer: Medicare Other

## 2023-08-07 ENCOUNTER — Inpatient Hospital Stay: Payer: Medicare Other | Attending: Internal Medicine

## 2023-08-07 VITALS — BP 138/77 | HR 54

## 2023-08-07 DIAGNOSIS — I129 Hypertensive chronic kidney disease with stage 1 through stage 4 chronic kidney disease, or unspecified chronic kidney disease: Secondary | ICD-10-CM | POA: Diagnosis not present

## 2023-08-07 DIAGNOSIS — D4621 Refractory anemia with excess of blasts 1: Secondary | ICD-10-CM | POA: Diagnosis present

## 2023-08-07 DIAGNOSIS — D631 Anemia in chronic kidney disease: Secondary | ICD-10-CM | POA: Insufficient documentation

## 2023-08-07 DIAGNOSIS — N183 Chronic kidney disease, stage 3 unspecified: Secondary | ICD-10-CM | POA: Diagnosis not present

## 2023-08-07 DIAGNOSIS — D539 Nutritional anemia, unspecified: Secondary | ICD-10-CM

## 2023-08-07 DIAGNOSIS — D469 Myelodysplastic syndrome, unspecified: Secondary | ICD-10-CM

## 2023-08-07 LAB — CBC WITH DIFFERENTIAL/PLATELET
Abs Immature Granulocytes: 0.02 10*3/uL (ref 0.00–0.07)
Basophils Absolute: 0 10*3/uL (ref 0.0–0.1)
Basophils Relative: 1 %
Eosinophils Absolute: 0.2 10*3/uL (ref 0.0–0.5)
Eosinophils Relative: 4 %
HCT: 27.6 % — ABNORMAL LOW (ref 39.0–52.0)
Hemoglobin: 9 g/dL — ABNORMAL LOW (ref 13.0–17.0)
Immature Granulocytes: 0 %
Lymphocytes Relative: 27 %
Lymphs Abs: 1.2 10*3/uL (ref 0.7–4.0)
MCH: 33.3 pg (ref 26.0–34.0)
MCHC: 32.6 g/dL (ref 30.0–36.0)
MCV: 102.2 fL — ABNORMAL HIGH (ref 80.0–100.0)
Monocytes Absolute: 0.4 10*3/uL (ref 0.1–1.0)
Monocytes Relative: 8 %
Neutro Abs: 2.7 10*3/uL (ref 1.7–7.7)
Neutrophils Relative %: 60 %
Platelets: 204 10*3/uL (ref 150–400)
RBC: 2.7 MIL/uL — ABNORMAL LOW (ref 4.22–5.81)
RDW: 27.1 % — ABNORMAL HIGH (ref 11.5–15.5)
WBC: 4.5 10*3/uL (ref 4.0–10.5)
nRBC: 0 % (ref 0.0–0.2)

## 2023-08-07 LAB — COMPREHENSIVE METABOLIC PANEL
ALT: 16 U/L (ref 0–44)
AST: 22 U/L (ref 15–41)
Albumin: 4.6 g/dL (ref 3.5–5.0)
Alkaline Phosphatase: 65 U/L (ref 38–126)
Anion gap: 5 (ref 5–15)
BUN: 30 mg/dL — ABNORMAL HIGH (ref 8–23)
CO2: 26 mmol/L (ref 22–32)
Calcium: 9.4 mg/dL (ref 8.9–10.3)
Chloride: 107 mmol/L (ref 98–111)
Creatinine, Ser: 1.42 mg/dL — ABNORMAL HIGH (ref 0.61–1.24)
GFR, Estimated: 51 mL/min — ABNORMAL LOW (ref >60)
Glucose, Bld: 159 mg/dL — ABNORMAL HIGH (ref 70–99)
Potassium: 4.3 mmol/L (ref 3.5–5.1)
Sodium: 138 mmol/L (ref 135–145)
Total Bilirubin: 3 mg/dL — ABNORMAL HIGH (ref 0.3–1.2)
Total Protein: 7.4 g/dL (ref 6.5–8.1)

## 2023-08-07 MED ORDER — DARBEPOETIN ALFA 300 MCG/0.6ML IJ SOSY
300.0000 ug | PREFILLED_SYRINGE | Freq: Once | INTRAMUSCULAR | Status: AC
  Administered 2023-08-07: 300 ug via SUBCUTANEOUS
  Filled 2023-08-07: qty 0.6

## 2023-08-12 ENCOUNTER — Ambulatory Visit: Payer: Medicare Other

## 2023-08-14 ENCOUNTER — Encounter: Payer: Medicare Other | Admitting: Occupational Therapy

## 2023-08-14 ENCOUNTER — Ambulatory Visit: Payer: Medicare Other

## 2023-08-19 ENCOUNTER — Ambulatory Visit: Payer: Medicare Other

## 2023-08-21 ENCOUNTER — Inpatient Hospital Stay: Payer: Medicare Other

## 2023-08-21 ENCOUNTER — Ambulatory Visit: Payer: Medicare Other

## 2023-08-21 VITALS — BP 136/80

## 2023-08-21 DIAGNOSIS — D4621 Refractory anemia with excess of blasts 1: Secondary | ICD-10-CM | POA: Diagnosis not present

## 2023-08-21 DIAGNOSIS — D539 Nutritional anemia, unspecified: Secondary | ICD-10-CM

## 2023-08-21 DIAGNOSIS — D469 Myelodysplastic syndrome, unspecified: Secondary | ICD-10-CM

## 2023-08-21 LAB — CBC WITH DIFFERENTIAL/PLATELET
Abs Immature Granulocytes: 0.01 10*3/uL (ref 0.00–0.07)
Basophils Absolute: 0 10*3/uL (ref 0.0–0.1)
Basophils Relative: 1 %
Eosinophils Absolute: 0.2 10*3/uL (ref 0.0–0.5)
Eosinophils Relative: 4 %
HCT: 26.5 % — ABNORMAL LOW (ref 39.0–52.0)
Hemoglobin: 8.7 g/dL — ABNORMAL LOW (ref 13.0–17.0)
Immature Granulocytes: 0 %
Lymphocytes Relative: 28 %
Lymphs Abs: 1.3 10*3/uL (ref 0.7–4.0)
MCH: 33.1 pg (ref 26.0–34.0)
MCHC: 32.8 g/dL (ref 30.0–36.0)
MCV: 100.8 fL — ABNORMAL HIGH (ref 80.0–100.0)
Monocytes Absolute: 0.3 10*3/uL (ref 0.1–1.0)
Monocytes Relative: 7 %
Neutro Abs: 2.9 10*3/uL (ref 1.7–7.7)
Neutrophils Relative %: 60 %
Platelets: 188 10*3/uL (ref 150–400)
RBC: 2.63 MIL/uL — ABNORMAL LOW (ref 4.22–5.81)
RDW: 27.4 % — ABNORMAL HIGH (ref 11.5–15.5)
Smear Review: NORMAL
WBC: 4.7 10*3/uL (ref 4.0–10.5)
nRBC: 0 % (ref 0.0–0.2)

## 2023-08-21 LAB — COMPREHENSIVE METABOLIC PANEL
ALT: 17 U/L (ref 0–44)
AST: 22 U/L (ref 15–41)
Albumin: 4.5 g/dL (ref 3.5–5.0)
Alkaline Phosphatase: 62 U/L (ref 38–126)
Anion gap: 5 (ref 5–15)
BUN: 35 mg/dL — ABNORMAL HIGH (ref 8–23)
CO2: 24 mmol/L (ref 22–32)
Calcium: 9.5 mg/dL (ref 8.9–10.3)
Chloride: 108 mmol/L (ref 98–111)
Creatinine, Ser: 1.45 mg/dL — ABNORMAL HIGH (ref 0.61–1.24)
GFR, Estimated: 50 mL/min — ABNORMAL LOW (ref >60)
Glucose, Bld: 142 mg/dL — ABNORMAL HIGH (ref 70–99)
Potassium: 4.7 mmol/L (ref 3.5–5.1)
Sodium: 137 mmol/L (ref 135–145)
Total Bilirubin: 3.1 mg/dL — ABNORMAL HIGH (ref 0.3–1.2)
Total Protein: 7.3 g/dL (ref 6.5–8.1)

## 2023-08-21 MED ORDER — DARBEPOETIN ALFA 300 MCG/0.6ML IJ SOSY
300.0000 ug | PREFILLED_SYRINGE | Freq: Once | INTRAMUSCULAR | Status: AC
  Administered 2023-08-21: 300 ug via SUBCUTANEOUS
  Filled 2023-08-21: qty 0.6

## 2023-08-26 ENCOUNTER — Ambulatory Visit: Payer: Medicare Other

## 2023-08-28 ENCOUNTER — Ambulatory Visit: Payer: Medicare Other

## 2023-09-02 ENCOUNTER — Ambulatory Visit: Payer: Medicare Other

## 2023-09-04 ENCOUNTER — Inpatient Hospital Stay: Payer: Medicare Other

## 2023-09-04 ENCOUNTER — Ambulatory Visit: Payer: Medicare Other

## 2023-09-04 ENCOUNTER — Inpatient Hospital Stay: Payer: Medicare Other | Attending: Internal Medicine

## 2023-09-04 VITALS — BP 140/50

## 2023-09-04 DIAGNOSIS — N183 Chronic kidney disease, stage 3 unspecified: Secondary | ICD-10-CM | POA: Insufficient documentation

## 2023-09-04 DIAGNOSIS — D469 Myelodysplastic syndrome, unspecified: Secondary | ICD-10-CM

## 2023-09-04 DIAGNOSIS — D4621 Refractory anemia with excess of blasts 1: Secondary | ICD-10-CM | POA: Diagnosis present

## 2023-09-04 DIAGNOSIS — D539 Nutritional anemia, unspecified: Secondary | ICD-10-CM

## 2023-09-04 LAB — COMPREHENSIVE METABOLIC PANEL
ALT: 16 U/L (ref 0–44)
AST: 22 U/L (ref 15–41)
Albumin: 4.4 g/dL (ref 3.5–5.0)
Alkaline Phosphatase: 62 U/L (ref 38–126)
Anion gap: 6 (ref 5–15)
BUN: 30 mg/dL — ABNORMAL HIGH (ref 8–23)
CO2: 23 mmol/L (ref 22–32)
Calcium: 9.4 mg/dL (ref 8.9–10.3)
Chloride: 109 mmol/L (ref 98–111)
Creatinine, Ser: 1.42 mg/dL — ABNORMAL HIGH (ref 0.61–1.24)
GFR, Estimated: 51 mL/min — ABNORMAL LOW (ref >60)
Glucose, Bld: 151 mg/dL — ABNORMAL HIGH (ref 70–99)
Potassium: 4.4 mmol/L (ref 3.5–5.1)
Sodium: 138 mmol/L (ref 135–145)
Total Bilirubin: 3.5 mg/dL — ABNORMAL HIGH (ref 0.3–1.2)
Total Protein: 7.2 g/dL (ref 6.5–8.1)

## 2023-09-04 LAB — CBC WITH DIFFERENTIAL/PLATELET
Abs Immature Granulocytes: 0.01 10*3/uL (ref 0.00–0.07)
Basophils Absolute: 0 10*3/uL (ref 0.0–0.1)
Basophils Relative: 1 %
Eosinophils Absolute: 0.2 10*3/uL (ref 0.0–0.5)
Eosinophils Relative: 4 %
HCT: 27.3 % — ABNORMAL LOW (ref 39.0–52.0)
Hemoglobin: 8.9 g/dL — ABNORMAL LOW (ref 13.0–17.0)
Immature Granulocytes: 0 %
Lymphocytes Relative: 28 %
Lymphs Abs: 1.2 10*3/uL (ref 0.7–4.0)
MCH: 32.6 pg (ref 26.0–34.0)
MCHC: 32.6 g/dL (ref 30.0–36.0)
MCV: 100 fL (ref 80.0–100.0)
Monocytes Absolute: 0.4 10*3/uL (ref 0.1–1.0)
Monocytes Relative: 9 %
Neutro Abs: 2.5 10*3/uL (ref 1.7–7.7)
Neutrophils Relative %: 58 %
Platelets: 179 10*3/uL (ref 150–400)
RBC: 2.73 MIL/uL — ABNORMAL LOW (ref 4.22–5.81)
RDW: 28.1 % — ABNORMAL HIGH (ref 11.5–15.5)
Smear Review: NORMAL
WBC: 4.4 10*3/uL (ref 4.0–10.5)
nRBC: 0 % (ref 0.0–0.2)

## 2023-09-04 MED ORDER — DARBEPOETIN ALFA 300 MCG/0.6ML IJ SOSY
300.0000 ug | PREFILLED_SYRINGE | Freq: Once | INTRAMUSCULAR | Status: AC
  Administered 2023-09-04: 300 ug via SUBCUTANEOUS
  Filled 2023-09-04: qty 0.6

## 2023-09-09 ENCOUNTER — Ambulatory Visit: Payer: Medicare Other

## 2023-09-11 ENCOUNTER — Ambulatory Visit: Payer: Medicare Other

## 2023-09-16 ENCOUNTER — Ambulatory Visit: Payer: Medicare Other

## 2023-09-18 ENCOUNTER — Ambulatory Visit: Payer: Medicare Other

## 2023-09-18 ENCOUNTER — Other Ambulatory Visit: Payer: Medicare Other

## 2023-09-18 ENCOUNTER — Other Ambulatory Visit: Payer: Self-pay | Admitting: *Deleted

## 2023-09-18 ENCOUNTER — Ambulatory Visit: Payer: Medicare Other | Admitting: Internal Medicine

## 2023-09-18 DIAGNOSIS — D469 Myelodysplastic syndrome, unspecified: Secondary | ICD-10-CM

## 2023-09-19 ENCOUNTER — Encounter: Payer: Self-pay | Admitting: Nurse Practitioner

## 2023-09-19 ENCOUNTER — Inpatient Hospital Stay: Payer: Medicare Other

## 2023-09-19 ENCOUNTER — Inpatient Hospital Stay (HOSPITAL_BASED_OUTPATIENT_CLINIC_OR_DEPARTMENT_OTHER): Payer: Medicare Other | Admitting: Nurse Practitioner

## 2023-09-19 VITALS — BP 148/55 | HR 56 | Temp 97.2°F | Ht 69.0 in | Wt 198.7 lb

## 2023-09-19 DIAGNOSIS — D469 Myelodysplastic syndrome, unspecified: Secondary | ICD-10-CM

## 2023-09-19 DIAGNOSIS — K869 Disease of pancreas, unspecified: Secondary | ICD-10-CM | POA: Diagnosis not present

## 2023-09-19 DIAGNOSIS — N1832 Chronic kidney disease, stage 3b: Secondary | ICD-10-CM | POA: Diagnosis not present

## 2023-09-19 DIAGNOSIS — D4621 Refractory anemia with excess of blasts 1: Secondary | ICD-10-CM | POA: Diagnosis not present

## 2023-09-19 LAB — CMP (CANCER CENTER ONLY)
ALT: 20 U/L (ref 0–44)
AST: 27 U/L (ref 15–41)
Albumin: 4.3 g/dL (ref 3.5–5.0)
Alkaline Phosphatase: 83 U/L (ref 38–126)
Anion gap: 7 (ref 5–15)
BUN: 25 mg/dL — ABNORMAL HIGH (ref 8–23)
CO2: 24 mmol/L (ref 22–32)
Calcium: 9 mg/dL (ref 8.9–10.3)
Chloride: 108 mmol/L (ref 98–111)
Creatinine: 1.58 mg/dL — ABNORMAL HIGH (ref 0.61–1.24)
GFR, Estimated: 45 mL/min — ABNORMAL LOW (ref >60)
Glucose, Bld: 109 mg/dL — ABNORMAL HIGH (ref 70–99)
Potassium: 4.4 mmol/L (ref 3.5–5.1)
Sodium: 139 mmol/L (ref 135–145)
Total Bilirubin: 3.1 mg/dL — ABNORMAL HIGH (ref 0.3–1.2)
Total Protein: 7 g/dL (ref 6.5–8.1)

## 2023-09-19 LAB — IRON AND TIBC
Iron: 54 ug/dL (ref 45–182)
Saturation Ratios: 20 % (ref 17.9–39.5)
TIBC: 276 ug/dL (ref 250–450)
UIBC: 222 ug/dL

## 2023-09-19 LAB — CBC WITH DIFFERENTIAL (CANCER CENTER ONLY)
Abs Immature Granulocytes: 0.02 10*3/uL (ref 0.00–0.07)
Basophils Absolute: 0 10*3/uL (ref 0.0–0.1)
Basophils Relative: 1 %
Eosinophils Absolute: 0.2 10*3/uL (ref 0.0–0.5)
Eosinophils Relative: 3 %
HCT: 26.1 % — ABNORMAL LOW (ref 39.0–52.0)
Hemoglobin: 8.4 g/dL — ABNORMAL LOW (ref 13.0–17.0)
Immature Granulocytes: 0 %
Lymphocytes Relative: 20 %
Lymphs Abs: 1 10*3/uL (ref 0.7–4.0)
MCH: 32.3 pg (ref 26.0–34.0)
MCHC: 32.2 g/dL (ref 30.0–36.0)
MCV: 100.4 fL — ABNORMAL HIGH (ref 80.0–100.0)
Monocytes Absolute: 0.4 10*3/uL (ref 0.1–1.0)
Monocytes Relative: 8 %
Neutro Abs: 3.7 10*3/uL (ref 1.7–7.7)
Neutrophils Relative %: 68 %
Platelet Count: 227 10*3/uL (ref 150–400)
RBC: 2.6 MIL/uL — ABNORMAL LOW (ref 4.22–5.81)
RDW: 28.6 % — ABNORMAL HIGH (ref 11.5–15.5)
WBC Count: 5.3 10*3/uL (ref 4.0–10.5)
nRBC: 0 % (ref 0.0–0.2)

## 2023-09-19 LAB — FERRITIN: Ferritin: 681 ng/mL — ABNORMAL HIGH (ref 24–336)

## 2023-09-19 LAB — VITAMIN B12: Vitamin B-12: 924 pg/mL — ABNORMAL HIGH (ref 180–914)

## 2023-09-19 MED ORDER — DARBEPOETIN ALFA 300 MCG/0.6ML IJ SOSY
300.0000 ug | PREFILLED_SYRINGE | Freq: Once | INTRAMUSCULAR | Status: AC
  Administered 2023-09-19: 300 ug via SUBCUTANEOUS
  Filled 2023-09-19: qty 0.6

## 2023-09-19 NOTE — Addendum Note (Signed)
Addended by: Clydia Llano on: 09/19/2023 04:22 PM   Modules accepted: Orders

## 2023-09-19 NOTE — Progress Notes (Signed)
Had covid shot yesterday. Flu shot 2 weeks ago, and  rsv done 3 days ago.

## 2023-09-19 NOTE — Progress Notes (Signed)
[facility] CONSULT NOTE  Patient Care Team: Gracelyn Nurse, MD as PCP - General (Internal Medicine) Lanier Prude, MD as PCP - Electrophysiology (Cardiology) Earna Coder, MD as Consulting Physician (Hematology and Oncology)  CHIEF COMPLAINTS/PURPOSE OF CONSULTATION: MDS  Oncology History Overview Note  # EGD-none; colonoscopy-never [Cologurad x2- NEG];FEB 2021- US- abdomen-no liver disease/ Mild splenic enlargement [460cc; kidney cysts]; B12 folic acid/LDH haptoglobin normal.  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  Alone.   Vernon [name] 77 y.o.  male with history of recently diagnosed low-grade MDS; A-fib on Eliquis currently on Aranesp, is here for follow-up. He continues to feel at baseline. No worsening fatigue, shortness of breath. He denies complaints.    Review of Systems  Constitutional:  Positive for malaise/fatigue. Negative for chills, diaphoresis, fever and weight loss.  HENT:  Negative for nosebleeds and sore throat.   Eyes:  Negative for double vision.  Respiratory:   Negative for cough, hemoptysis, sputum production, shortness of breath and wheezing.   Cardiovascular:  Negative for chest pain, palpitations, orthopnea and leg swelling.  Gastrointestinal:  Negative for abdominal pain, blood in stool, constipation, diarrhea, heartburn, melena, nausea and vomiting.  Genitourinary:  Negative for dysuria, frequency and urgency.  Musculoskeletal:  Negative for back pain and joint pain.  Skin: Negative.  Negative for itching and rash.  Neurological:  Negative for dizziness, tingling, focal weakness, weakness and headaches.  Endo/Heme/Allergies:  Does not bruise/bleed easily.  Psychiatric/Behavioral:  Negative for depression. The patient is not nervous/anxious and does not have insomnia.     MEDICAL HISTORY:  Past Medical History:  Diagnosis Date   Anemia    Aortic stenosis    Arthritis    Complication of anesthesia    hard time getting bp up after knee replacement   Coronary artery disease    Diabetes mellitus without complication (HCC)    GERD (gastroesophageal reflux disease)    occ tums prn   History of hiatal hernia    Hypertension    MDS (myelodysplastic syndrome) (HCC)    Presence of Watchman left atrial appendage closure device 07/26/2022   27mm Watchman FLX with Dr. Lalla Brothers    SURGICAL HISTORY: Past Surgical History:  Procedure Laterality Date   CARPAL TUNNEL RELEASE  2012   CRANIOTOMY N/A 02/10/2022   Procedure: SUBOCCIPITAL CRANIECTOMY FOR EVACUATION OF CEREBELLAR HEMATOMA;  Surgeon: Barnett Abu, MD;  Location: [facility] OR;  Service: Neurosurgery;  Laterality: N/A;   JOINT REPLACEMENT Right 2010   LEFT ATRIAL APPENDAGE OCCLUSION N/A 07/26/2022   Procedure: LEFT ATRIAL APPENDAGE OCCLUSION;  Surgeon: Lanier Prude, MD;  Location: [facility] INVASIVE CV LAB;  Service: Cardiovascular;  Laterality: N/A;  RIGHT/LEFT HEART CATH AND CORONARY ANGIOGRAPHY N/A 04/10/2023   Procedure: RIGHT/LEFT HEART CATH AND CORONARY ANGIOGRAPHY;  Surgeon: Marykay Lex, MD;  Location: [facility] INVASIVE CV LAB;  Service: Cardiovascular;  Laterality: N/A;   SHOULDER ARTHROSCOPY WITH ROTATOR CUFF REPAIR AND OPEN BICEPS TENODESIS Right 11/09/2019   Procedure: RIGHT SHOULDER ARTHROSCOPY WITH SUBSCAPULARIS REPAIR, SUBACROMIAL DECOMPRESSION,MINI OPEN ROTATOR CUFF REPAIR;  Surgeon: Signa Kell, MD;  Location: [facility] ORS;  Service: Orthopedics;  Laterality: Right;   TEE WITHOUT CARDIOVERSION N/A 07/26/2022   Procedure: TRANSESOPHAGEAL ECHOCARDIOGRAM (TEE);  Surgeon: Lanier Prude, MD;  Location: [facility] INVASIVE CV LAB;  Service: Cardiovascular;  Laterality: N/A;    SOCIAL HISTORY: Social History   Socioeconomic History   Marital status: Married    Spouse name: Not on file   Number of children: 2   Years of education: Not on file   Highest education level: Not on file  Occupational History   Occupation: Retired - Academic librarian  Tobacco Use   Smoking status: Former    Current packs/day: 0.00    Average packs/day: 0.5 packs/day for 8.0 years (4.0 ttl pk-yrs)    Types: Cigarettes    Start date: 07/21/1970    Quit date: 07/21/1978    Years since quitting: 45.1   Smokeless tobacco: Never  Vaping Use   Vaping status: Never Used  Substance and Sexual Activity   Alcohol use: Yes    Alcohol/week: 0.0 - 1.0 standard drinks of alcohol    Comment: rare beer   Drug use: Never   Sexual activity: Not on file  Other Topics Concern   Not on file  Social History Narrative   Lives in [place]; with wife; quit smoking in early 14s; ocassional/ rare [may be 1 a month beer]. 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    Social Determinants of Health   Financial Resource Strain: Not on file  Food Insecurity: No Food Insecurity (12/18/2022)   Hunger Vital Sign    Worried About Running Out of Food in the Last Year: Never true    Ran Out of Food in the Last Year: Never true  Transportation Needs: No Transportation Needs (12/18/2022)   PRAPARE - Scientist, research (physical sciences) (Medical): No    Lack of Transportation (Non-Medical): No  Physical Activity: Not on file  Stress: Not on file  Social Connections: Not on file  Intimate Partner Violence: Not At Risk (12/18/2022)   Humiliation, Afraid, Rape, and Kick questionnaire    Fear of Current or Ex-Partner: No    Emotionally Abused: No    Physically Abused: No    Sexually Abused: No    FAMILY HISTORY: Family History  Problem Relation Age of Onset   Cancer Maternal Uncle     ALLERGIES:  has No Known Allergies.  MEDICATIONS:  Current Outpatient Medications  Medication Sig Dispense Refill   aspirin EC 81 MG tablet Take 1 tablet (81 mg total) by mouth daily. Swallow whole. 90 tablet 3   ezetimibe (ZETIA) 10 MG tablet Take 1 tablet (10 mg total) by mouth daily. 90 tablet 3   furosemide (LASIX) 20 MG tablet Take 0.5 tablets (10 mg total) by mouth daily. May take extra tablet if needed 30 tablet 3   metFORMIN (GLUCOPHAGE) 500 MG tablet Take 500 mg by mouth 2 (two) times daily with a meal.     Multiple Vitamin (MULTI-VITAMIN) tablet Take 1 tablet by mouth daily.     No current facility-administered medications for  this visit.   Facility-Administered Medications Ordered in Other Visits  Medication Dose Route Frequency Provider Last Rate Last Admin   Darbepoetin Alfa (ARANESP) injection 300 mcg  300 mcg Subcutaneous Once Louretta Shorten R, MD        PHYSICAL EXAMINATION: Vitals:   09/19/23 1430  BP: (!) 148/55  Pulse: (!) 56  Temp: (!) 97.2 F (36.2 C)  SpO2: 95%   Filed Weights   09/19/23 1430  Weight: 198 lb 11.2 oz (90.1 kg)   Physical Exam Constitutional:      Appearance: He is not ill-appearing.  HENT:     Head: Normocephalic and atraumatic.  Cardiovascular:     Rate and Rhythm: Normal rate and regular rhythm.  Pulmonary:     Effort: Pulmonary effort is normal. No respiratory distress.     Breath sounds: No wheezing.  Abdominal:     General: There is no distension.      Tenderness: There is no guarding.  Musculoskeletal:     Comments: wheelchair  Skin:    General: Skin is warm.     Coloration: Skin is not pale.  Neurological:     Mental Status: He is alert and oriented to person, place, and time. Mental status is at baseline.  Psychiatric:        Mood and Affect: Mood and affect normal.        Behavior: Behavior normal.    LABORATORY DATA:  I have reviewed the data as listed Lab Results  Component Value Date   WBC 5.3 09/19/2023   HGB 8.4 (L) 09/19/2023   HCT 26.1 (L) 09/19/2023   MCV 100.4 (H) 09/19/2023   PLT 227 09/19/2023   Recent Labs    12/17/22 2041 12/18/22 0556 12/26/22 1119 01/09/23 1008 03/06/23 0953 08/21/23 1047 09/04/23 1107 09/19/23 1413  NA 135   < >  --  140   < > 137 138 139  K 4.5   < >  --  4.5   < > 4.7 4.4 4.4  CL 100   < >  --  106   < > 108 109 108  CO2 23   < >  --  25   < > 24 23 24   GLUCOSE 113*   < >  --  150*   < > 142* 151* 109*  BUN 47*   < >  --  25*   < > 35* 30* 25*  CREATININE 1.75*   < >  --  1.25*   < > 1.45* 1.42* 1.58*  CALCIUM 9.3   < >  --  9.2   < > 9.5 9.4 9.0  GFRNONAA 40*   < >  --  60*   < > 50* 51* 45*  PROT 8.3*   < > 6.8 7.6   < > 7.3 7.2 7.0  ALBUMIN 4.4   < > 4.4 4.1   < > 4.5 4.4 4.3  AST 102*   < > 48* 29   < > 22 22 27   ALT 105*   < > 47* 21   < > 17 16 20   ALKPHOS 284*   < > 397* 169*   < > 62 62 83  BILITOT 13.1*   < > 3.6* 2.3*   < > 3.1* 3.5* 3.1*  BILIDIR 7.1*  --  1.83* 0.7*  --   --   --   --   IBILI  --   --   --  1.6*  --   --   --   --    < > = values in this interval not displayed.   Iron/TIBC/Ferritin/ %Sat    Component Value Date/Time   IRON 54 09/19/2023 1413   TIBC 276 09/19/2023 1413   FERRITIN 681 (H) 09/19/2023 1413   IRONPCTSAT 20 09/19/2023 1413   No results found.  Assessment & Plan:   # Low-grade myelodysplastic syndrome- refractory anemia with ringed sideroblasts. POSITIVE for SF3B1.  Patient currently on Retacrit [since April 2021];  hemoglobin between 8-9.  Patient is fairly asymptomatic. Stable. Continue aranesp every 2 weeks. If wosrening symptoms, fatigue, etc., could also consider luspatercept/revlimid.    Today, hemoglobin is 8.4. Worse. Has been downtrending over past 2 months. Off oral iron (Ferritin and iron sat elevated). Proceed with aranesp today. Discussed option for adjusting aranesp dosing or schedule vs starting treatment. He remains asymptomatic, therefore I recommend increasing aranesp to 500 (currently on 300) every 2 weeks. If ongoing drop in hemoglobin or he becomes symptomatic, may need to consider treatment.     # HTN- at home in 140s; BP- 160s- monitor closely. stable   # Intracranial bleed while on Eliquis/Hx of stroke s/p evacuation- [at home > 2 months]- OFF ELIQUIS. Watchman. On asprin. stable   # Hx Of A.fib [OFF eliquis secondary to intracranial bleed]- s/p watchman device- on asprin ONLY- stable.    # Stage kidney disease-stage III [Dr.Korrapati]- stable   # Elevated bilirubin- [JAN 2024] liver suggesting underlying cirrhosis. No signs of biliary tract obstruction vs-gallbladder sludge.? Enzyme deficient- triggered by stress/UTI.  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 Repeat abdominal MRI with and without IV gadolinium in JAN 2025.    # Numerous tiny T2 hyperintensities scattered throughout the Spleen- benign lesions  small cavernous hemangiomas or lymphangioma-    # DISPOSITION:  # aranesp today # in 2 week - lab- H&H; possible aranesp 500 (new dose) # in 4 weeks- lab- H&H; possible aranesp # in 6 weeks- lab- H&H; possible aranesp # in 8 weeks- lab- H&H; possible aranesp # in 10 weeks- lab- H&H; possible aranesp January- MRI Abdomen w wo contrast # # in 12  weeks- labs-cbc/bmp, ferritin, iron; Dr Donneta Romberg, +/-  aranesp- [place]  No problem-specific Assessment & Plan notes found for this encounter.  All questions were answered. The patient knows to call the clinic with any problems, questions or concerns.    Alinda Dooms, NP 09/19/2023

## 2023-09-30 ENCOUNTER — Ambulatory Visit: Payer: Medicare Other | Admitting: Internal Medicine

## 2023-09-30 ENCOUNTER — Other Ambulatory Visit: Payer: Medicare Other

## 2023-09-30 ENCOUNTER — Ambulatory Visit: Payer: Medicare Other

## 2023-10-02 ENCOUNTER — Inpatient Hospital Stay: Payer: Medicare Other

## 2023-10-02 ENCOUNTER — Inpatient Hospital Stay: Payer: Medicare Other | Attending: Internal Medicine

## 2023-10-02 VITALS — BP 129/60

## 2023-10-02 DIAGNOSIS — D469 Myelodysplastic syndrome, unspecified: Secondary | ICD-10-CM

## 2023-10-02 DIAGNOSIS — N1832 Chronic kidney disease, stage 3b: Secondary | ICD-10-CM | POA: Insufficient documentation

## 2023-10-02 DIAGNOSIS — D4621 Refractory anemia with excess of blasts 1: Secondary | ICD-10-CM | POA: Diagnosis present

## 2023-10-02 LAB — HEMOGLOBIN AND HEMATOCRIT (CANCER CENTER ONLY)
HCT: 27.4 % — ABNORMAL LOW (ref 39.0–52.0)
Hemoglobin: 8.7 g/dL — ABNORMAL LOW (ref 13.0–17.0)

## 2023-10-02 MED ORDER — DARBEPOETIN ALFA 500 MCG/ML IJ SOSY
500.0000 ug | PREFILLED_SYRINGE | Freq: Once | INTRAMUSCULAR | Status: AC
  Administered 2023-10-02: 500 ug via SUBCUTANEOUS
  Filled 2023-10-02: qty 1

## 2023-10-05 ENCOUNTER — Other Ambulatory Visit: Payer: Self-pay | Admitting: Cardiovascular Disease

## 2023-10-16 ENCOUNTER — Inpatient Hospital Stay: Payer: Medicare Other

## 2023-10-16 VITALS — BP 148/88

## 2023-10-16 DIAGNOSIS — D4621 Refractory anemia with excess of blasts 1: Secondary | ICD-10-CM | POA: Diagnosis not present

## 2023-10-16 DIAGNOSIS — D469 Myelodysplastic syndrome, unspecified: Secondary | ICD-10-CM

## 2023-10-16 LAB — HEMOGLOBIN AND HEMATOCRIT (CANCER CENTER ONLY)
HCT: 29.9 % — ABNORMAL LOW (ref 39.0–52.0)
Hemoglobin: 9.5 g/dL — ABNORMAL LOW (ref 13.0–17.0)

## 2023-10-16 MED ORDER — DARBEPOETIN ALFA 500 MCG/ML IJ SOSY
500.0000 ug | PREFILLED_SYRINGE | Freq: Once | INTRAMUSCULAR | Status: AC
  Administered 2023-10-16: 500 ug via SUBCUTANEOUS
  Filled 2023-10-16: qty 1

## 2023-10-30 ENCOUNTER — Inpatient Hospital Stay: Payer: Medicare Other

## 2023-10-30 ENCOUNTER — Inpatient Hospital Stay: Payer: Medicare Other | Attending: Internal Medicine

## 2023-10-30 VITALS — BP 142/78

## 2023-10-30 DIAGNOSIS — N183 Chronic kidney disease, stage 3 unspecified: Secondary | ICD-10-CM | POA: Insufficient documentation

## 2023-10-30 DIAGNOSIS — D469 Myelodysplastic syndrome, unspecified: Secondary | ICD-10-CM

## 2023-10-30 DIAGNOSIS — I129 Hypertensive chronic kidney disease with stage 1 through stage 4 chronic kidney disease, or unspecified chronic kidney disease: Secondary | ICD-10-CM | POA: Diagnosis not present

## 2023-10-30 DIAGNOSIS — D4621 Refractory anemia with excess of blasts 1: Secondary | ICD-10-CM | POA: Diagnosis present

## 2023-10-30 LAB — HEMOGLOBIN AND HEMATOCRIT (CANCER CENTER ONLY)
HCT: 29.8 % — ABNORMAL LOW (ref 39.0–52.0)
Hemoglobin: 9.5 g/dL — ABNORMAL LOW (ref 13.0–17.0)

## 2023-10-30 MED ORDER — DARBEPOETIN ALFA 500 MCG/ML IJ SOSY
500.0000 ug | PREFILLED_SYRINGE | Freq: Once | INTRAMUSCULAR | Status: AC
  Administered 2023-10-30: 500 ug via SUBCUTANEOUS
  Filled 2023-10-30: qty 1

## 2023-11-03 NOTE — Progress Notes (Unsigned)
Cardiology Office Note:  .   Date:  11/04/2023  ID:  Vernon [name], DOB 01-Mar-1946, MRN [id number] PCP: Gracelyn Nurse, MD  [facility] Providers Cardiologist:  Reatha Harps, MD Electrophysiologist:  Lanier Prude, MD { History of Present Illness: .   Vernon [name] is a 77 y.o. male with history of Afib, CAD, AS, HTN, HLD who presents for follow-up.    History of Present Illness   The patient, a 77 year old with a history of atrial fibrillation, nonobstructive CAD, moderate aortic stenosis, hyperlipidemia, heart failure, pulmonary hypertension, and myelodysplastic syndrome, presents for a follow-up appointment. The patient reports feeling well overall, but notes that daily activities have become more laborious, likely due to fluid retention. The patient has been taking Lasix every other day, but recently stopped for two weeks due to concerns about its impact on hemoglobin levels. During this period, the patient's hemoglobin levels increased. The patient has since resumed taking Lasix every other day. The patient also reports taking Zetia daily for hyperlipidemia, which is well-controlled. The patient's diet includes regular consumption of high-salt foods, which may be contributing to fluid retention.          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 Permanent Afib -s/p Watchman 06/2022 3. CAD -CAC 5415 (97th percentile)  -Cardiac PET 02/26/2023: normal perfusion, MBFR 1.41 -> Microvascular disease  -LHC: 04/10/2023, 40% pRCA, 40% mid LAD, 20% mLCX  4. Moderate to severe aortic stenosis  -MG 20 mmHG, AVA 0.8 cm2, DI 0.29 5. HTN 6. HLD -T chol 115, HDL 50, LDL 57, TG 38 7. MDS 8. Anemia 9. CKD IIIa 10. DM -A1c 5.7 11. HFpEF -50-55% 12. 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 Pertinent positives noted in the HPI.     Studies Reviewed: Marland Kitchen       Physical Exam:   VS:  BP (!) 144/82 (BP Location:  Left Arm, Patient Position: Sitting, Cuff Size: Normal)   Pulse 70   Ht 5\' 9"  (1.753 m)   Wt 203 lb (92.1 kg)   SpO2 97%   BMI 29.98 kg/m    Wt Readings from Last 3 Encounters:  11/04/23 203 lb (92.1 kg)  09/19/23 198 lb 11.2 oz (90.1 kg)  07/24/23 190 lb (86.2 kg)    GEN: Well nourished, well developed in no acute distress NECK: No JVD; No carotid bruits CARDIAC: irregular rhythm, 3/6 SEM, rubs, gallops RESPIRATORY:  Clear to auscultation without rales, wheezing or rhonchi  ABDOMEN: Soft, non-tender, non-distended EXTREMITIES:  No edema; No deformity  ASSESSMENT AND PLAN: .   Assessment and Plan    HFpEF Mod PHTN Significant fluid retention with 2+ pitting edema in lower extremities. Likely due to inconsistent Lasix use due to concerns about impact on hemoglobin levels. Patient reports no significant shortness of breath. -Continue Lasix 10mg  every fourth day. -Advise patient to elevate legs and use compression stockings.  -Advise patient to reduce salt intake, specifically eliminating nabs from diet. -would recommend more lasix today but he does not want to do this.   Aortic Stenosis Moderate on last examination. -Order repeat echocardiogram prior to next appointment in 3 months. -yearly echo.   Hyperlipidemia CAD Non-obstructive CAD LDL cholesterol at goal on Zetia 10mg  daily. -Continue Zetia 10mg  daily.\   Permanent Afib s/p LAAC -on ASA due to watchman. No AC. Rate controlled on no meds.   Hypertension Blood  pressure elevated, likely related to volume overload. -No change in management at this time, monitor closely.  Follow-up -Return in 3 months for follow-up with APP and repeat echocardiogram for aortic stenosis.  -Return in 6 months for follow-up with physician.              Follow-up: Return in about 3 months (around 02/02/2024).  Time Spent with Patient: I have spent a total of 35 minutes caring for this patient today face to face, ordering and  reviewing labs/tests, reviewing prior records/medical history, examining the patient, establishing an assessment and plan, communicating results/findings to the patient/family, and documenting in the medical record.   Signed, Lenna Gilford. Flora Lipps, MD, [facility]  [facility]  [address] [place], [place] [address] [phone]  10:46 AM

## 2023-11-04 ENCOUNTER — Ambulatory Visit: Payer: Medicare Other | Attending: Cardiovascular Disease | Admitting: Cardiovascular Disease

## 2023-11-04 ENCOUNTER — Encounter: Payer: Self-pay | Admitting: Cardiovascular Disease

## 2023-11-04 VITALS — BP 144/82 | HR 70 | Ht 69.0 in | Wt 203.0 lb

## 2023-11-04 DIAGNOSIS — I2510 Atherosclerotic heart disease of native coronary artery without angina pectoris: Secondary | ICD-10-CM | POA: Diagnosis not present

## 2023-11-04 DIAGNOSIS — R931 Abnormal findings on diagnostic imaging of heart and coronary circulation: Secondary | ICD-10-CM

## 2023-11-04 DIAGNOSIS — I4821 Permanent atrial fibrillation: Secondary | ICD-10-CM

## 2023-11-04 DIAGNOSIS — N1832 Chronic kidney disease, stage 3b: Secondary | ICD-10-CM

## 2023-11-04 DIAGNOSIS — I272 Pulmonary hypertension, unspecified: Secondary | ICD-10-CM

## 2023-11-04 DIAGNOSIS — I4811 Longstanding persistent atrial fibrillation: Secondary | ICD-10-CM

## 2023-11-04 DIAGNOSIS — I5032 Chronic diastolic (congestive) heart failure: Secondary | ICD-10-CM

## 2023-11-04 DIAGNOSIS — I350 Nonrheumatic aortic (valve) stenosis: Secondary | ICD-10-CM | POA: Diagnosis not present

## 2023-11-04 DIAGNOSIS — E782 Mixed hyperlipidemia: Secondary | ICD-10-CM

## 2023-11-04 NOTE — Patient Instructions (Addendum)
Medication Instructions:  Your physician recommends that you continue on your current medications as directed. Please refer to the Current Medication list given to you today.    *If you need a refill on your cardiac medications before your next appointment, please call your pharmacy*   Lab Work: None    If you have labs (blood work) drawn today and your tests are completely normal, you will receive your results only by: MyChart Message (if you have MyChart) OR A paper copy in the mail If you have any lab test that is abnormal or we need to change your treatment, we will call you to review the results.   Testing/Procedures: Echo will be scheduled at [facility] [address] in MARCH 2025 before your next appointment.   Your physician has requested that you have an echocardiogram. Echocardiography is a painless test that uses sound waves to create images of your heart. It provides your doctor with information about the size and shape of your heart and how well your heart's chambers and valves are working. This procedure takes approximately one hour. There are no restrictions for this procedure. Please do NOT wear cologne, perfume, aftershave, or lotions (deodorant is allowed). Please arrive 15 minutes prior to your appointment time.    Follow-Up: At [facility], you and your health needs are our priority.  As part of our continuing mission to provide you with exceptional heart care, we have created designated Provider Care Teams.  These Care Teams include your primary Cardiologist (physician) and Advanced Practice Providers (APPs -  Physician Assistants and Nurse Practitioners) who all work together to provide you with the care you need, when you need it.  We recommend signing up for the patient portal called "MyChart".  Sign up information is provided on this After Visit Summary.  MyChart is used to connect with patients for Virtual Visits (Telemedicine).  Patients are able to view  lab/test results, encounter notes, upcoming appointments, etc.  Non-urgent messages can be sent to your provider as well.   To learn more about what you can do with MyChart, go to [email or web address].    Your next appointment:   3 month(s)  The format for your next appointment:   In Person  Provider:   Edd Fabian, FNP, Micah Flesher, PA-C, Marjie Skiff, PA-C, Robet Leu, PA-C, Juanda Crumble, PA-C, Joni Reining, DNP, ANP, Azalee Course, PA-C, Bernadene Person, NP, or Reather Littler, NP    Then, Reatha Harps, MD will plan to see you again in 6 month(s).   Other Instructions

## 2023-11-13 ENCOUNTER — Inpatient Hospital Stay: Payer: Medicare Other

## 2023-11-13 VITALS — BP 142/80

## 2023-11-13 DIAGNOSIS — D469 Myelodysplastic syndrome, unspecified: Secondary | ICD-10-CM

## 2023-11-13 DIAGNOSIS — D4621 Refractory anemia with excess of blasts 1: Secondary | ICD-10-CM | POA: Diagnosis not present

## 2023-11-13 LAB — HEMOGLOBIN AND HEMATOCRIT (CANCER CENTER ONLY)
HCT: 31.1 % — ABNORMAL LOW (ref 39.0–52.0)
Hemoglobin: 9.9 g/dL — ABNORMAL LOW (ref 13.0–17.0)

## 2023-11-13 MED ORDER — DARBEPOETIN ALFA 500 MCG/ML IJ SOSY
500.0000 ug | PREFILLED_SYRINGE | Freq: Once | INTRAMUSCULAR | Status: AC
  Administered 2023-11-13: 500 ug via SUBCUTANEOUS
  Filled 2023-11-13: qty 1

## 2023-11-28 ENCOUNTER — Inpatient Hospital Stay: Payer: Medicare Other

## 2023-11-28 ENCOUNTER — Inpatient Hospital Stay: Payer: Medicare Other | Attending: Internal Medicine

## 2023-11-28 ENCOUNTER — Other Ambulatory Visit: Payer: Medicare Other

## 2023-11-28 ENCOUNTER — Ambulatory Visit: Payer: Medicare Other

## 2023-11-28 VITALS — BP 157/76

## 2023-11-28 DIAGNOSIS — D4621 Refractory anemia with excess of blasts 1: Secondary | ICD-10-CM | POA: Diagnosis present

## 2023-11-28 DIAGNOSIS — D469 Myelodysplastic syndrome, unspecified: Secondary | ICD-10-CM

## 2023-11-28 LAB — HEMOGLOBIN AND HEMATOCRIT (CANCER CENTER ONLY)
HCT: 30.2 % — ABNORMAL LOW (ref 39.0–52.0)
Hemoglobin: 9.9 g/dL — ABNORMAL LOW (ref 13.0–17.0)

## 2023-11-28 MED ORDER — DARBEPOETIN ALFA 500 MCG/ML IJ SOSY
500.0000 ug | PREFILLED_SYRINGE | Freq: Once | INTRAMUSCULAR | Status: AC
  Administered 2023-11-28: 500 ug via SUBCUTANEOUS
  Filled 2023-11-28: qty 1

## 2023-12-11 ENCOUNTER — Inpatient Hospital Stay: Payer: Medicare Other | Admitting: Internal Medicine

## 2023-12-11 ENCOUNTER — Encounter: Payer: Self-pay | Admitting: Internal Medicine

## 2023-12-11 ENCOUNTER — Inpatient Hospital Stay: Payer: Medicare Other

## 2023-12-11 VITALS — BP 144/78 | HR 72 | Temp 97.7°F | Wt 198.0 lb

## 2023-12-11 DIAGNOSIS — D469 Myelodysplastic syndrome, unspecified: Secondary | ICD-10-CM

## 2023-12-11 DIAGNOSIS — D4621 Refractory anemia with excess of blasts 1: Secondary | ICD-10-CM | POA: Diagnosis not present

## 2023-12-11 LAB — CBC WITH DIFFERENTIAL/PLATELET
Abs Immature Granulocytes: 0.02 10*3/uL (ref 0.00–0.07)
Basophils Absolute: 0 10*3/uL (ref 0.0–0.1)
Basophils Relative: 1 %
Eosinophils Absolute: 0.2 10*3/uL (ref 0.0–0.5)
Eosinophils Relative: 5 %
HCT: 30.8 % — ABNORMAL LOW (ref 39.0–52.0)
Hemoglobin: 9.9 g/dL — ABNORMAL LOW (ref 13.0–17.0)
Immature Granulocytes: 1 %
Lymphocytes Relative: 23 %
Lymphs Abs: 1 10*3/uL (ref 0.7–4.0)
MCH: 30.3 pg (ref 26.0–34.0)
MCHC: 32.1 g/dL (ref 30.0–36.0)
MCV: 94.2 fL (ref 80.0–100.0)
Monocytes Absolute: 0.3 10*3/uL (ref 0.1–1.0)
Monocytes Relative: 6 %
Neutro Abs: 2.8 10*3/uL (ref 1.7–7.7)
Neutrophils Relative %: 64 %
Platelets: 192 10*3/uL (ref 150–400)
RBC: 3.27 MIL/uL — ABNORMAL LOW (ref 4.22–5.81)
RDW: 30 % — ABNORMAL HIGH (ref 11.5–15.5)
WBC: 4.3 10*3/uL (ref 4.0–10.5)
nRBC: 0 % (ref 0.0–0.2)

## 2023-12-11 LAB — IRON AND TIBC
Iron: 110 ug/dL (ref 45–182)
Saturation Ratios: 36 % (ref 17.9–39.5)
TIBC: 308 ug/dL (ref 250–450)
UIBC: 198 ug/dL

## 2023-12-11 LAB — BASIC METABOLIC PANEL
Anion gap: 9 (ref 5–15)
BUN: 28 mg/dL — ABNORMAL HIGH (ref 8–23)
CO2: 24 mmol/L (ref 22–32)
Calcium: 9.3 mg/dL (ref 8.9–10.3)
Chloride: 107 mmol/L (ref 98–111)
Creatinine, Ser: 1.34 mg/dL — ABNORMAL HIGH (ref 0.61–1.24)
GFR, Estimated: 55 mL/min — ABNORMAL LOW (ref >60)
Glucose, Bld: 120 mg/dL — ABNORMAL HIGH (ref 70–99)
Potassium: 4.8 mmol/L (ref 3.5–5.1)
Sodium: 140 mmol/L (ref 135–145)

## 2023-12-11 LAB — FERRITIN: Ferritin: 648 ng/mL — ABNORMAL HIGH (ref 24–336)

## 2023-12-11 MED ORDER — DARBEPOETIN ALFA 500 MCG/ML IJ SOSY
500.0000 ug | PREFILLED_SYRINGE | Freq: Once | INTRAMUSCULAR | Status: AC
  Administered 2023-12-11: 500 ug via SUBCUTANEOUS

## 2023-12-11 NOTE — Progress Notes (Signed)
 START ON PATHWAY REGIMEN - MDS     A cycle is every 21 days:     Luspatercept -aamt   **Always confirm dose/schedule in your pharmacy ordering system**  Patient Characteristics: Lower-Risk, MDS-Ring Sideroblasts/SF3B1 Mutation, Second Line and Beyond, IDH1 Wildtype OR Prior IDH1 Treatment, Anemia Requiring Treatment, No Prior Luspatercept  Did cytogenetic and molecular analysis reveal an isolated del(5q) or del(5q) with one other cytogenetic abnormality except monosomy 7 or 7q deletion with no concomitant TP53 mutations<= No Disease Characteristics: MDS-Ring Sideroblasts/SF3B1 Mutation Line of therapy: Second Line and Beyond Disease Characteristics: Anemia Requiring Treatment Intent of Therapy: Non-Curative / Palliative Intent, Discussed with Patient

## 2023-12-11 NOTE — Progress Notes (Signed)
 [facility] CONSULT NOTE  Patient Care Team: Little Riff, MD as PCP - General (Internal Medicine) Boyce Byes, MD as PCP - Electrophysiology (Cardiology) O'Neal, Cathay Clonts, MD as PCP - Cardiology (Cardiology) Gwyn Leos, MD as Consulting Physician (Hematology and Oncology)  CHIEF COMPLAINTS/PURPOSE OF CONSULTATION: MDS  Oncology History Overview Note  # EGD-none; colonoscopy-never [Cologurad x2- NEG];FEB 2021- US - abdomen-no liver disease/ Mild splenic enlargement [460cc; kidney cysts]; B12 folic acid /LDH haptoglobin normal.  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   # Hx Of A.fib [OFF eliquis  secondary to intracranial bleed]- s/p watchman device- on asprin ONLY- stable   # NGS/MOLECULAR TESTS:[facility] ON hem- May 2021- Cux-1; DNMT3A; SF3B**  # PALLIATIVE CARE EVALUATION: NA   DIAGNOSIS: MDS low risk   GOALS: Control  CURRENT/MOST RECENT THERAPY : Retacrit     MDS (myelodysplastic syndrome) (HCC)  02/18/2020 Initial Diagnosis   MDS (myelodysplastic syndrome), low grade (HCC)   01/01/2024 -  Chemotherapy   Patient is on Treatment Plan : MYELODYSPLASIA Luspatercept  q21d       HISTORY OF PRESENTING ILLNESS: Ambulating in a wheelchair.  With wife.   Vernon [name] 78 y.o.  male with history of recently diagnosed low-grade MDS; A-fib status post Watchman device; off Eliquis  [history of hemorrhagic stroke] currently on aranesp   is here for follow-up.  Patient otherwise doing well.  Denies any bleeding or bruising.  Patient has not had any further  strokes.  Denies any worsening fatigue.  Denies any nausea vomiting abdominal pain.  No weight loss.  No worsening shortness of breath or cough.  Review of Systems  Constitutional:  Positive for malaise/fatigue. Negative for chills, diaphoresis, fever and weight loss.  HENT:  Negative for nosebleeds and sore throat.   Eyes:  Negative for double vision.  Respiratory:  Negative for cough, hemoptysis, sputum production, shortness of breath and wheezing.   Cardiovascular:  Negative for chest pain, palpitations, orthopnea and leg swelling.  Gastrointestinal:  Negative for abdominal pain, blood in stool, constipation, diarrhea, heartburn, melena, nausea and vomiting.  Genitourinary:  Negative for dysuria, frequency and urgency.  Musculoskeletal:  Negative for back pain and joint pain.  Skin: Negative.  Negative for itching and rash.  Neurological:  Negative for dizziness, tingling, focal weakness, weakness and headaches.  Endo/Heme/Allergies:  Does not bruise/bleed easily.  Psychiatric/Behavioral:  Negative for depression. The patient is not nervous/anxious and does not have insomnia.     MEDICAL HISTORY:  Past Medical History:  Diagnosis Date   Anemia    Aortic stenosis    Arthritis    Complication of anesthesia    hard time getting bp up after knee replacement   Coronary artery disease    Diabetes mellitus without complication (HCC)    GERD (gastroesophageal reflux disease)    occ tums prn   History of hiatal hernia    Hypertension    MDS (myelodysplastic syndrome) (HCC)    Presence of Watchman left atrial appendage closure device 07/26/2022   27mm Watchman FLX with Dr. Marven Slimmer    SURGICAL HISTORY: Past Surgical History:  Procedure Laterality Date   CARPAL TUNNEL RELEASE  2012   CRANIOTOMY N/A 02/10/2022  Procedure: SUBOCCIPITAL CRANIECTOMY FOR EVACUATION OF CEREBELLAR HEMATOMA;  Surgeon: Elna Haggis, MD;  Location: [facility] OR;  Service: Neurosurgery;  Laterality: N/A;   JOINT  REPLACEMENT Right 2010   LEFT ATRIAL APPENDAGE OCCLUSION N/A 07/26/2022   Procedure: LEFT ATRIAL APPENDAGE OCCLUSION;  Surgeon: Boyce Byes, MD;  Location: [facility] INVASIVE CV LAB;  Service: Cardiovascular;  Laterality: N/A;   RIGHT/LEFT HEART CATH AND CORONARY ANGIOGRAPHY N/A 04/10/2023   Procedure: RIGHT/LEFT HEART CATH AND CORONARY ANGIOGRAPHY;  Surgeon: Arleen Lacer, MD;  Location: [facility] INVASIVE CV LAB;  Service: Cardiovascular;  Laterality: N/A;   SHOULDER ARTHROSCOPY WITH ROTATOR CUFF REPAIR AND OPEN BICEPS TENODESIS Right 11/09/2019   Procedure: RIGHT SHOULDER ARTHROSCOPY WITH SUBSCAPULARIS REPAIR, SUBACROMIAL DECOMPRESSION,MINI OPEN ROTATOR CUFF REPAIR;  Surgeon: Lorri Rota, MD;  Location: [facility] ORS;  Service: Orthopedics;  Laterality: Right;   TEE WITHOUT CARDIOVERSION N/A 07/26/2022   Procedure: TRANSESOPHAGEAL ECHOCARDIOGRAM (TEE);  Surgeon: Boyce Byes, MD;  Location: [facility] INVASIVE CV LAB;  Service: Cardiovascular;  Laterality: N/A;    SOCIAL HISTORY: Social History   Socioeconomic History   Marital status: Married    Spouse name: Not on file   Number of children: 2   Years of education: Not on file   Highest education level: Not on file  Occupational History   Occupation: Retired - Academic librarian  Tobacco Use   Smoking status: Former    Current packs/day: 0.00    Average packs/day: 0.5 packs/day for 8.0 years (4.0 ttl pk-yrs)    Types: Cigarettes    Start date: 07/21/1970    Quit date: 07/21/1978    Years since quitting: 45.4   Smokeless tobacco: Never  Vaping Use   Vaping status: Never Used  Substance and Sexual Activity   Alcohol use: Yes    Alcohol/week: 0.0 - 1.0 standard drinks of alcohol    Comment: rare beer   Drug use: Never   Sexual activity: Not on file  Other Topics Concern   Not on file  Social History Narrative   Lives in [place]; with wife; quit smoking in early 45s; ocassional/ rare [may be 1 a month beer]. 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    Social Drivers of Corporate investment banker Strain: Not on file  Food Insecurity: No Food Insecurity (12/18/2022)   Hunger Vital Sign    Worried About Running Out of Food in the Last Year: Never true    Ran Out of Food in the Last Year: Never true  Transportation Needs: No Transportation Needs (12/18/2022)   PRAPARE - Administrator, Civil Service (Medical): No    Lack of Transportation (Non-Medical): No  Physical Activity: Not on file  Stress: Not on file  Social Connections: Not on file  Intimate Partner Violence: Not At Risk (12/18/2022)   Humiliation, Afraid, Rape, and Kick questionnaire    Fear of Current or Ex-Partner: No    Emotionally Abused: No    Physically Abused: No    Sexually Abused: No    FAMILY HISTORY: Family History  Problem Relation Age of Onset   Cancer Maternal Uncle     ALLERGIES:  has no known allergies.  MEDICATIONS:  Current Outpatient Medications  Medication Sig Dispense Refill   aspirin  EC 81 MG tablet Take 1 tablet (81 mg total) by mouth daily. Swallow whole. 90 tablet 3   ezetimibe  (ZETIA ) 10 MG tablet Take 1 tablet (10 mg total) by mouth daily. 90 tablet 3   furosemide  (  LASIX ) 20 MG tablet TAKE 0.5 TABLET DAILY. MAY TAKE EXTRA TABLET IF NEEDED 90 tablet 1   metFORMIN  (GLUCOPHAGE ) 500 MG tablet Take 500 mg by mouth 2 (two) times daily with a meal.     Multiple Vitamin (MULTI-VITAMIN) tablet Take 1 tablet by mouth daily.     No current facility-administered medications for this visit.      PHYSICAL EXAMINATION:   Vitals:   12/11/23 1024  BP: (!) 144/78  Pulse: 72  Temp: 97.7 F (36.5 C)  SpO2: 100%   Filed Weights   12/11/23 1024  Weight: 198 lb (89.8 kg)    Physical Exam HENT:     Head: Normocephalic and atraumatic.     Mouth/Throat:     Pharynx: No oropharyngeal exudate.  Eyes:     Pupils: Pupils are equal, round, and reactive to light.  Cardiovascular:     Rate and Rhythm: Normal rate and  regular rhythm.  Pulmonary:     Effort: Pulmonary effort is normal. No respiratory distress.     Breath sounds: Normal breath sounds. No wheezing.  Abdominal:     General: Bowel sounds are normal. There is no distension.     Palpations: Abdomen is soft. There is no mass.     Tenderness: There is no abdominal tenderness. There is no guarding or rebound.  Musculoskeletal:        General: No tenderness. Normal range of motion.     Cervical back: Normal range of motion and neck supple.  Skin:    General: Skin is warm.  Neurological:     Mental Status: He is alert and oriented to person, place, and time.  Psychiatric:        Mood and Affect: Affect normal.    LABORATORY DATA:  I have reviewed the data as listed Lab Results  Component Value Date   WBC 4.3 12/11/2023   HGB 9.9 (L) 12/11/2023   HCT 30.8 (L) 12/11/2023   MCV 94.2 12/11/2023   PLT 192 12/11/2023   Recent Labs    12/17/22 2041 12/18/22 0556 12/26/22 1119 01/09/23 1008 03/06/23 0953 08/21/23 1047 09/04/23 1107 09/19/23 1413 12/11/23 1019  NA 135   < >  --  140   < > 137 138 139 140  K 4.5   < >  --  4.5   < > 4.7 4.4 4.4 4.8  CL 100   < >  --  106   < > 108 109 108 107  CO2 23   < >  --  25   < > 24 23 24 24   GLUCOSE 113*   < >  --  150*   < > 142* 151* 109* 120*  BUN 47*   < >  --  25*   < > 35* 30* 25* 28*  CREATININE 1.75*   < >  --  1.25*   < > 1.45* 1.42* 1.58* 1.34*  CALCIUM  9.3   < >  --  9.2   < > 9.5 9.4 9.0 9.3  GFRNONAA 40*   < >  --  60*   < > 50* 51* 45* 55*  PROT 8.3*   < > 6.8 7.6   < > 7.3 7.2 7.0  --   ALBUMIN  4.4   < > 4.4 4.1   < > 4.5 4.4 4.3  --   AST 102*   < > 48* 29   < > 22 22 27   --  ALT 105*   < > 47* 21   < > 17 16 20   --   ALKPHOS 284*   < > 397* 169*   < > 62 62 83  --   BILITOT 13.1*   < > 3.6* 2.3*   < > 3.1* 3.5* 3.1*  --   BILIDIR 7.1*  --  1.83* 0.7*  --   --   --   --   --   IBILI  --   --   --  1.6*  --   --   --   --   --    < > = values in this interval not  displayed.     No results found.  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  Patient currently on epo agents [since April 2021]; hemoglobin between 8-9.   Stable.   # Patient has been on Aranesp  injections almost every 2 weeks.  However has not needed any blood transfusions.  I reviewed the data and mechanism of action of using Luspatercept  to treat his anemia.  Patient has a high probability of response given ring sideroblasts and also SF 3 B1 mutation.  Discussed the potential side effects including arthralgias and also mild fatigue.  Overall very well-tolerated.  #Today hemoglobin is 9.8 - stable Continue retacrit   for now.  OCT 2023- B12 790; Ferritin-826  [recently started Fe/B12]  Okay to come off of oral iron.  Awaiting iron studies today.  # HTN- at home in 140s; BP- 160s- monitor closely.  stable  # Hx Of A.fib [OFF eliquis  secondary to intracranial bleed]- s/p watchman device- on asprin ONLY- stable  # Stage kidney disease-stage III [dr.Korrapati]-stable  # Elevated bilirubin- [JAN 2024] liver suggesting underlying cirrhosis. No signs of biliary tract obstruction vs-gallbladder sludge.? Enzyme deficient- triggered by stress/UTI.  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 Repeat abdominal MRI with and without IV gadolinium will be ordered at next visit.   # DISPOSITION:  # aranesp  today # in 3 weeks- MD: labs- cbc/cmp; iron studies; ferritin;  Luspatercept ; possible Aranesp - Dr.B   All questions were answered. The patient knows to call the clinic with any problems, questions or concerns.    Gwyn Leos, MD 12/11/2023 12:07 PM

## 2023-12-11 NOTE — Assessment & Plan Note (Addendum)
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  Patient currently on epo agents [since April 2021]; hemoglobin between 8-9.   Stable.   # Patient has been on Aranesp  injections almost every 2 weeks.  However has not needed any blood transfusions.  I reviewed the data and mechanism of action of using Luspatercept  to treat his anemia.  Patient has a high probability of response given ring sideroblasts and also SF 3 B1 mutation.  Discussed the potential side effects including arthralgias and also mild fatigue.  Overall very well-tolerated.  #Today hemoglobin is 9.8 - stable Continue retacrit   for now.  OCT 2023- B12 790; Ferritin-826  [recently started Fe/B12]  Okay to come off of oral iron.  Awaiting iron studies today.  # HTN- at home in 140s; BP- 160s- monitor closely.  stable  # Hx Of A.fib [OFF eliquis  secondary to intracranial bleed]- s/p watchman device- on asprin ONLY- stable  # Stage kidney disease-stage III [dr.Korrapati]-stable  # Elevated bilirubin- [JAN 2024] liver suggesting underlying cirrhosis. No signs of biliary tract obstruction vs-gallbladder sludge.? Enzyme deficient- triggered by stress/UTI.  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 Repeat abdominal MRI with and without IV gadolinium will be ordered at next visit.   # DISPOSITION:  # aranesp  today # in 3 weeks- MD: labs- cbc/cmp; iron studies; ferritin;  Luspatercept ; possible Aranesp - Dr.B

## 2023-12-12 ENCOUNTER — Other Ambulatory Visit: Payer: Self-pay

## 2023-12-18 NOTE — Progress Notes (Signed)
 Pharmacist Chemotherapy Monitoring - Initial Assessment    Anticipated start date: 01/01/24   The following has been reviewed per standard work regarding the patient's treatment regimen: The patient's diagnosis, treatment plan and drug doses, and organ/hematologic function Lab orders and baseline tests specific to treatment regimen  The treatment plan start date, drug sequencing, and pre-medications Prior authorization status  Patient's documented medication list, including drug-drug interaction screen and prescriptions for anti-emetics and supportive care specific to the treatment regimen The drug concentrations, fluid compatibility, administration routes, and timing of the medications to be used The patient's access for treatment and lifetime cumulative dose history, if applicable  The patient's medication allergies and previous infusion related reactions, if applicable   Changes made to treatment plan:  N/A  Follow up needed:  N/A   Maudie FORBES Andreas, PharmD, BCPS Clinical Pharmacist   12/18/2023  8:32 AM

## 2024-01-01 ENCOUNTER — Inpatient Hospital Stay (HOSPITAL_BASED_OUTPATIENT_CLINIC_OR_DEPARTMENT_OTHER): Payer: Medicare Other | Admitting: Internal Medicine

## 2024-01-01 ENCOUNTER — Inpatient Hospital Stay: Payer: Medicare Other | Attending: Internal Medicine

## 2024-01-01 ENCOUNTER — Encounter: Payer: Self-pay | Admitting: Internal Medicine

## 2024-01-01 ENCOUNTER — Inpatient Hospital Stay: Payer: Medicare Other

## 2024-01-01 VITALS — BP 148/86 | HR 67 | Ht 69.0 in | Wt 198.9 lb

## 2024-01-01 DIAGNOSIS — D469 Myelodysplastic syndrome, unspecified: Secondary | ICD-10-CM | POA: Diagnosis not present

## 2024-01-01 DIAGNOSIS — K862 Cyst of pancreas: Secondary | ICD-10-CM | POA: Diagnosis not present

## 2024-01-01 DIAGNOSIS — N183 Chronic kidney disease, stage 3 unspecified: Secondary | ICD-10-CM | POA: Diagnosis not present

## 2024-01-01 DIAGNOSIS — D4621 Refractory anemia with excess of blasts 1: Secondary | ICD-10-CM | POA: Insufficient documentation

## 2024-01-01 DIAGNOSIS — I129 Hypertensive chronic kidney disease with stage 1 through stage 4 chronic kidney disease, or unspecified chronic kidney disease: Secondary | ICD-10-CM | POA: Diagnosis not present

## 2024-01-01 DIAGNOSIS — K869 Disease of pancreas, unspecified: Secondary | ICD-10-CM

## 2024-01-01 LAB — CMP (CANCER CENTER ONLY)
ALT: 10 U/L (ref 0–44)
AST: 17 U/L (ref 15–41)
Albumin: 4.4 g/dL (ref 3.5–5.0)
Alkaline Phosphatase: 88 U/L (ref 38–126)
Anion gap: 11 (ref 5–15)
BUN: 27 mg/dL — ABNORMAL HIGH (ref 8–23)
CO2: 23 mmol/L (ref 22–32)
Calcium: 9.2 mg/dL (ref 8.9–10.3)
Chloride: 105 mmol/L (ref 98–111)
Creatinine: 1.37 mg/dL — ABNORMAL HIGH (ref 0.61–1.24)
GFR, Estimated: 53 mL/min — ABNORMAL LOW (ref >60)
Glucose, Bld: 108 mg/dL — ABNORMAL HIGH (ref 70–99)
Potassium: 4.7 mmol/L (ref 3.5–5.1)
Sodium: 139 mmol/L (ref 135–145)
Total Bilirubin: 2.9 mg/dL — ABNORMAL HIGH (ref 0.0–1.2)
Total Protein: 7.4 g/dL (ref 6.5–8.1)

## 2024-01-01 LAB — CBC WITH DIFFERENTIAL (CANCER CENTER ONLY)
Abs Immature Granulocytes: 0.02 10*3/uL (ref 0.00–0.07)
Basophils Absolute: 0 10*3/uL (ref 0.0–0.1)
Basophils Relative: 1 %
Eosinophils Absolute: 0.3 10*3/uL (ref 0.0–0.5)
Eosinophils Relative: 5 %
HCT: 26.8 % — ABNORMAL LOW (ref 39.0–52.0)
Hemoglobin: 8.8 g/dL — ABNORMAL LOW (ref 13.0–17.0)
Immature Granulocytes: 0 %
Lymphocytes Relative: 23 %
Lymphs Abs: 1.3 10*3/uL (ref 0.7–4.0)
MCH: 29.4 pg (ref 26.0–34.0)
MCHC: 32.8 g/dL (ref 30.0–36.0)
MCV: 89.6 fL (ref 80.0–100.0)
Monocytes Absolute: 0.4 10*3/uL (ref 0.1–1.0)
Monocytes Relative: 7 %
Neutro Abs: 3.6 10*3/uL (ref 1.7–7.7)
Neutrophils Relative %: 64 %
Platelet Count: 216 10*3/uL (ref 150–400)
RBC: 2.99 MIL/uL — ABNORMAL LOW (ref 4.22–5.81)
RDW: 30.7 % — ABNORMAL HIGH (ref 11.5–15.5)
WBC Count: 5.6 10*3/uL (ref 4.0–10.5)
nRBC: 0 % (ref 0.0–0.2)

## 2024-01-01 LAB — IRON AND TIBC
Iron: 81 ug/dL (ref 45–182)
Saturation Ratios: 25 % (ref 17.9–39.5)
TIBC: 329 ug/dL (ref 250–450)
UIBC: 248 ug/dL

## 2024-01-01 LAB — FERRITIN: Ferritin: 741 ng/mL — ABNORMAL HIGH (ref 24–336)

## 2024-01-01 MED ORDER — LUSPATERCEPT-AAMT 75 MG ~~LOC~~ SOLR
1.0000 mg/kg | Freq: Once | SUBCUTANEOUS | Status: DC
Start: 2024-01-01 — End: 2024-01-01

## 2024-01-01 MED ORDER — DARBEPOETIN ALFA 500 MCG/ML IJ SOSY
500.0000 ug | PREFILLED_SYRINGE | Freq: Once | INTRAMUSCULAR | Status: AC
  Administered 2024-01-01: 500 ug via SUBCUTANEOUS
  Filled 2024-01-01: qty 1

## 2024-01-01 NOTE — Assessment & Plan Note (Deleted)
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  Patient currently on epo agents [since April 2021]; hemoglobin between 8-9.   Stable.   # Patient has been on Aranesp  injections almost every 2 weeks.  However has not needed any blood transfusions.  I reviewed the data and mechanism of action of using Luspatercept  to treat his anemia.  Patient has a high probability of response given ring sideroblasts and also SF 3 B1 mutation.  Discussed the potential side effects including arthralgias and also mild fatigue.  Overall very well-tolerated.  #Today hemoglobin is 9.8 - stable Continue retacrit   for now.  OCT 2023- B12 790; Ferritin-826  [recently started Fe/B12]  Okay to come off of oral iron.  Awaiting iron studies today.  # HTN- at home in 140s; BP- 160s- monitor closely.  stable  # Hx Of A.fib [OFF eliquis  secondary to intracranial bleed]- s/p watchman device- on asprin ONLY- stable  # Stage kidney disease-stage III [dr.Korrapati]-stable  # Elevated bilirubin- [JAN 2024] liver suggesting underlying cirrhosis. No signs of biliary tract obstruction vs-gallbladder sludge.? Enzyme deficient- triggered by stress/UTI.  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 Repeat abdominal MRI with and without IV gadolinium will be ordered at next visit.   # DISPOSITION:  # aranesp  today # in 3 weeks- MD: labs- cbc/cmp; iron studies; ferritin;  Luspatercept ; possible Aranesp - Dr.B

## 2024-01-01 NOTE — Progress Notes (Signed)
 No concerns today

## 2024-01-01 NOTE — Assessment & Plan Note (Addendum)
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  Patient currently on epo agents [since April 2021]; hemoglobin between 8-9.   Stable.   # Patient has been on Aranesp  injections almost every 2 weeks.  However has not needed any blood transfusions.  I reviewed the data and mechanism of action of using Luspatercept  to treat his anemia.  Patient has a high probability of response given ring sideroblasts and also SF 3 B1 mutation.   #Today hemoglobin is 9.8 - stable.  Continue Aranesp  for now.  OCT 2023- B12 790; Ferritin-826  [recently started Fe/B12]  Okay to come off of oral iron.  Awaiting iron studies today.  However given insurance reasons-Luspatercept  not given today.  Will check with insurance as patient has not had blood transfusion in the last 8 weeks.  # HTN- at home in 140s; BP- 160s- monitor closely.  stable  # Hx Of A.fib [OFF eliquis  secondary to intracranial bleed]- s/p watchman device- on asprin ONLY- stable  # Stage kidney disease-stage III [dr.Korrapati]-stable  # Elevated bilirubin- [JAN 2024] liver suggesting underlying cirrhosis. No signs of biliary tract obstruction vs-gallbladder sludge.? Enzyme deficient- triggered by stress/UTI.  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 Repeat abdominal MRI with and without IV gadolinium will be ordered today.   # DISPOSITION:  # aranesp  today;  HOLD Luspatercept  today  # in 3 weeks- MD: labs- cbc/cmp; ;  Luspatercept ; possible Aranesp ; MRI abdomen- Dr.B

## 2024-01-01 NOTE — Progress Notes (Signed)
 [facility] CONSULT NOTE  Patient Care Team: Rudolpho Norleen BIRCH, MD as PCP - General (Internal Medicine) Cindie Ole DASEN, MD as PCP - Electrophysiology (Cardiology) O'Neal, Darryle Ned, MD as PCP - Cardiology (Cardiology) Rennie Cindy SAUNDERS, MD as Consulting Physician (Hematology and Oncology)  CHIEF COMPLAINTS/PURPOSE OF CONSULTATION: MDS  Oncology History Overview Note  # EGD-none; colonoscopy-never [Cologurad x2- NEG];FEB 2021- US - abdomen-no liver disease/ Mild splenic enlargement [460cc; kidney cysts]; B12 folic acid /LDH haptoglobin normal.  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   # Hx Of A.fib [OFF eliquis  secondary to intracranial bleed]- s/p watchman device- on asprin ONLY- stable   # NGS/MOLECULAR TESTS:[facility] ON hem- May 2021- Cux-1; DNMT3A; SF3B**  # PALLIATIVE CARE EVALUATION: NA   DIAGNOSIS: MDS low risk   GOALS: Control  CURRENT/MOST RECENT THERAPY : Retacrit     MDS (myelodysplastic syndrome) (HCC)  02/18/2020 Initial Diagnosis   MDS (myelodysplastic syndrome), low grade (HCC)   01/01/2024 -  Chemotherapy   Patient is on Treatment Plan : MYELODYSPLASIA Luspatercept  q21d       HISTORY OF PRESENTING ILLNESS: Ambulating in a wheelchair.  With wife.   Vernon [name] Molt 78 y.o.  male with history of recently diagnosed low-grade MDS; A-fib status post Watchman device; off Eliquis  [history of hemorrhagic stroke] currently on aranesp   is here for follow-up.  Patient otherwise doing well.  Denies any bleeding or bruising.  Patient has not had any further  strokes.  Denies any worsening fatigue.  Denies any nausea vomiting abdominal pain.  No weight loss.  No worsening shortness of breath or cough.  Review of Systems  Constitutional:  Positive for malaise/fatigue. Negative for chills, diaphoresis, fever and weight loss.  HENT:  Negative for nosebleeds and sore throat.   Eyes:  Negative for double vision.  Respiratory:  Negative for cough, hemoptysis, sputum production, shortness of breath and wheezing.   Cardiovascular:  Negative for chest pain, palpitations, orthopnea and leg swelling.  Gastrointestinal:  Negative for abdominal pain, blood in stool, constipation, diarrhea, heartburn, melena, nausea and vomiting.  Genitourinary:  Negative for dysuria, frequency and urgency.  Musculoskeletal:  Negative for back pain and joint pain.  Skin: Negative.  Negative for itching and rash.  Neurological:  Negative for dizziness, tingling, focal weakness, weakness and headaches.  Endo/Heme/Allergies:  Does not bruise/bleed easily.  Psychiatric/Behavioral:  Negative for depression. The patient is not nervous/anxious and does not have insomnia.     MEDICAL HISTORY:  Past Medical History:  Diagnosis Date   Anemia    Aortic stenosis    Arthritis    Complication of anesthesia    hard time getting bp up after knee replacement   Coronary artery disease    Diabetes mellitus without complication (HCC)    GERD (gastroesophageal reflux disease)    occ tums prn   History of hiatal hernia    Hypertension    MDS (myelodysplastic syndrome) (HCC)    Presence of Watchman left atrial appendage closure device 07/26/2022   27mm Watchman FLX with Dr. Cindie    SURGICAL HISTORY: Past Surgical History:  Procedure Laterality Date   CARPAL TUNNEL RELEASE  2012   CRANIOTOMY N/A 02/10/2022  Procedure: SUBOCCIPITAL CRANIECTOMY FOR EVACUATION OF CEREBELLAR HEMATOMA;  Surgeon: Colon Shove, MD;  Location: [facility] OR;  Service: Neurosurgery;  Laterality: N/A;   JOINT  REPLACEMENT Right 2010   LEFT ATRIAL APPENDAGE OCCLUSION N/A 07/26/2022   Procedure: LEFT ATRIAL APPENDAGE OCCLUSION;  Surgeon: Cindie Ole DASEN, MD;  Location: [facility] INVASIVE CV LAB;  Service: Cardiovascular;  Laterality: N/A;   RIGHT/LEFT HEART CATH AND CORONARY ANGIOGRAPHY N/A 04/10/2023   Procedure: RIGHT/LEFT HEART CATH AND CORONARY ANGIOGRAPHY;  Surgeon: Anner Alm ORN, MD;  Location: [facility] INVASIVE CV LAB;  Service: Cardiovascular;  Laterality: N/A;   SHOULDER ARTHROSCOPY WITH ROTATOR CUFF REPAIR AND OPEN BICEPS TENODESIS Right 11/09/2019   Procedure: RIGHT SHOULDER ARTHROSCOPY WITH SUBSCAPULARIS REPAIR, SUBACROMIAL DECOMPRESSION,MINI OPEN ROTATOR CUFF REPAIR;  Surgeon: Tobie Priest, MD;  Location: [facility] ORS;  Service: Orthopedics;  Laterality: Right;   TEE WITHOUT CARDIOVERSION N/A 07/26/2022   Procedure: TRANSESOPHAGEAL ECHOCARDIOGRAM (TEE);  Surgeon: Cindie Ole DASEN, MD;  Location: [facility] INVASIVE CV LAB;  Service: Cardiovascular;  Laterality: N/A;    SOCIAL HISTORY: Social History   Socioeconomic History   Marital status: Married    Spouse name: Not on file   Number of children: 2   Years of education: Not on file   Highest education level: Not on file  Occupational History   Occupation: Retired - Academic Librarian  Tobacco Use   Smoking status: Former    Current packs/day: 0.00    Average packs/day: 0.5 packs/day for 8.0 years (4.0 ttl pk-yrs)    Types: Cigarettes    Start date: 07/21/1970    Quit date: 07/21/1978    Years since quitting: 45.4   Smokeless tobacco: Never  Vaping Use   Vaping status: Never Used  Substance and Sexual Activity   Alcohol use: Yes    Alcohol/week: 0.0 - 1.0 standard drinks of alcohol    Comment: rare beer   Drug use: Never   Sexual activity: Not on file  Other Topics Concern   Not on file  Social History Narrative   Lives in [place]; with wife; quit smoking in early 70s; ocassional/ rare [may be 1 a month beer]. 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    Social Drivers of Corporate Investment Banker Strain: Not on file  Food Insecurity: No Food Insecurity (12/18/2022)   Hunger Vital Sign    Worried About Running Out of Food in the Last Year: Never true    Ran Out of Food in the Last Year: Never true  Transportation Needs: No Transportation Needs (12/18/2022)   PRAPARE - Administrator, Civil Service (Medical): No    Lack of Transportation (Non-Medical): No  Physical Activity: Not on file  Stress: Not on file  Social Connections: Not on file  Intimate Partner Violence: Not At Risk (12/18/2022)   Humiliation, Afraid, Rape, and Kick questionnaire    Fear of Current or Ex-Partner: No    Emotionally Abused: No    Physically Abused: No    Sexually Abused: No    FAMILY HISTORY: Family History  Problem Relation Age of Onset   Cancer Maternal Uncle     ALLERGIES:  has no known allergies.  MEDICATIONS:  Current Outpatient Medications  Medication Sig Dispense Refill   aspirin  EC 81 MG tablet Take 1 tablet (81 mg total) by mouth daily. Swallow whole. 90 tablet 3   ezetimibe  (ZETIA ) 10 MG tablet Take 1 tablet (10 mg total) by mouth daily. 90 tablet 3   furosemide  (  LASIX ) 20 MG tablet TAKE 0.5 TABLET DAILY. MAY TAKE EXTRA TABLET IF NEEDED (Patient taking differently: Taking half a tablet every 4 days) 90 tablet 1   metFORMIN  (GLUCOPHAGE ) 500 MG tablet Take 500 mg by mouth 2 (two) times daily with a meal.     Multiple Vitamin (MULTI-VITAMIN) tablet Take 1 tablet by mouth daily.     No current facility-administered medications for this visit.      PHYSICAL EXAMINATION:   Vitals:   01/01/24 1456 01/01/24 1536  BP: (!) 168/80 (!) 148/86  Pulse: 67   SpO2: 95%    Filed Weights   01/01/24 1456  Weight: 198 lb 14.4 oz (90.2 kg)    Physical Exam HENT:     Head: Normocephalic and atraumatic.     Mouth/Throat:     Pharynx: No oropharyngeal exudate.  Eyes:     Pupils: Pupils are equal, round, and  reactive to light.  Cardiovascular:     Rate and Rhythm: Normal rate and regular rhythm.  Pulmonary:     Effort: Pulmonary effort is normal. No respiratory distress.     Breath sounds: Normal breath sounds. No wheezing.  Abdominal:     General: Bowel sounds are normal. There is no distension.     Palpations: Abdomen is soft. There is no mass.     Tenderness: There is no abdominal tenderness. There is no guarding or rebound.  Musculoskeletal:        General: No tenderness. Normal range of motion.     Cervical back: Normal range of motion and neck supple.  Skin:    General: Skin is warm.  Neurological:     Mental Status: He is alert and oriented to person, place, and time.  Psychiatric:        Mood and Affect: Affect normal.    LABORATORY DATA:  I have reviewed the data as listed Lab Results  Component Value Date   WBC 5.6 01/01/2024   HGB 8.8 (L) 01/01/2024   HCT 26.8 (L) 01/01/2024   MCV 89.6 01/01/2024   PLT 216 01/01/2024   Recent Labs    01/09/23 1008 03/06/23 0953 09/04/23 1107 09/19/23 1413 12/11/23 1019 01/01/24 1442  NA 140   < > 138 139 140 139  K 4.5   < > 4.4 4.4 4.8 4.7  CL 106   < > 109 108 107 105  CO2 25   < > 23 24 24 23   GLUCOSE 150*   < > 151* 109* 120* 108*  BUN 25*   < > 30* 25* 28* 27*  CREATININE 1.25*   < > 1.42* 1.58* 1.34* 1.37*  CALCIUM  9.2   < > 9.4 9.0 9.3 9.2  GFRNONAA 60*   < > 51* 45* 55* 53*  PROT 7.6   < > 7.2 7.0  --  7.4  ALBUMIN  4.1   < > 4.4 4.3  --  4.4  AST 29   < > 22 27  --  17  ALT 21   < > 16 20  --  10  ALKPHOS 169*   < > 62 83  --  88  BILITOT 2.3*   < > 3.5* 3.1*  --  2.9*  BILIDIR 0.7*  --   --   --   --   --   IBILI 1.6*  --   --   --   --   --    < > = values in this interval not  displayed.     No results found.  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  Patient currently on epo agents [since April 2021];  hemoglobin between 8-9.   Stable.   # Patient has been on Aranesp  injections almost every 2 weeks.  However has not needed any blood transfusions.  I reviewed the data and mechanism of action of using Luspatercept  to treat his anemia.  Patient has a high probability of response given ring sideroblasts and also SF 3 B1 mutation.   #Today hemoglobin is 9.8 - stable.  Continue Aranesp  for now.  OCT 2023- B12 790; Ferritin-826  [recently started Fe/B12]  Okay to come off of oral iron.  Awaiting iron studies today.  However given insurance reasons-Luspatercept  not given today.  Will check with insurance as patient has not had blood transfusion in the last 8 weeks.  # HTN- at home in 140s; BP- 160s- monitor closely.  stable  # Hx Of A.fib [OFF eliquis  secondary to intracranial bleed]- s/p watchman device- on asprin ONLY- stable  # Stage kidney disease-stage III [dr.Korrapati]-stable  # Elevated bilirubin- [JAN 2024] liver suggesting underlying cirrhosis. No signs of biliary tract obstruction vs-gallbladder sludge.? Enzyme deficient- triggered by stress/UTI.  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 Repeat abdominal MRI with and without IV gadolinium will be ordered today.   # DISPOSITION:  # aranesp  today;  HOLD Luspatercept  today  # in 3 weeks- MD: labs- cbc/cmp; ;  Luspatercept ; possible Aranesp ; MRI abdomen- Dr.B   All questions were answered. The patient knows to call the clinic with any problems, questions or concerns.    Cindy JONELLE Joe, MD 01/06/2024 1:55 PM

## 2024-01-06 ENCOUNTER — Encounter: Payer: Self-pay | Admitting: Internal Medicine

## 2024-01-13 ENCOUNTER — Ambulatory Visit: Payer: Medicare Other

## 2024-01-13 ENCOUNTER — Other Ambulatory Visit: Payer: Self-pay | Admitting: Internal Medicine

## 2024-01-13 ENCOUNTER — Ambulatory Visit
Admission: RE | Admit: 2024-01-13 | Discharge: 2024-01-13 | Disposition: A | Discharge status: Home / Self Care | Payer: Medicare Other | Source: Ambulatory Visit | Attending: Internal Medicine | Admitting: Internal Medicine

## 2024-01-13 DIAGNOSIS — K862 Cyst of pancreas: Secondary | ICD-10-CM | POA: Insufficient documentation

## 2024-01-13 DIAGNOSIS — K869 Disease of pancreas, unspecified: Secondary | ICD-10-CM | POA: Insufficient documentation

## 2024-01-13 MED ORDER — GADOBUTROL 1 MMOL/ML IV SOLN
9.0000 mL | Freq: Once | INTRAVENOUS | Status: AC | PRN
  Administered 2024-01-13: 9 mL via INTRAVENOUS

## 2024-01-23 ENCOUNTER — Encounter: Payer: Self-pay | Admitting: Internal Medicine

## 2024-01-23 ENCOUNTER — Inpatient Hospital Stay: Payer: Medicare Other

## 2024-01-23 ENCOUNTER — Inpatient Hospital Stay: Payer: Medicare Other | Admitting: Internal Medicine

## 2024-01-23 VITALS — BP 113/71 | HR 57 | Temp 97.2°F | Ht 69.0 in | Wt 201.5 lb

## 2024-01-23 DIAGNOSIS — D4621 Refractory anemia with excess of blasts 1: Secondary | ICD-10-CM | POA: Diagnosis not present

## 2024-01-23 DIAGNOSIS — D469 Myelodysplastic syndrome, unspecified: Secondary | ICD-10-CM

## 2024-01-23 LAB — CBC WITH DIFFERENTIAL (CANCER CENTER ONLY)
Abs Immature Granulocytes: 0.03 10*3/uL (ref 0.00–0.07)
Basophils Absolute: 0 10*3/uL (ref 0.0–0.1)
Basophils Relative: 1 %
Eosinophils Absolute: 0.1 10*3/uL (ref 0.0–0.5)
Eosinophils Relative: 2 %
HCT: 27.6 % — ABNORMAL LOW (ref 39.0–52.0)
Hemoglobin: 8.8 g/dL — ABNORMAL LOW (ref 13.0–17.0)
Immature Granulocytes: 1 %
Lymphocytes Relative: 16 %
Lymphs Abs: 0.9 10*3/uL (ref 0.7–4.0)
MCH: 28.8 pg (ref 26.0–34.0)
MCHC: 31.9 g/dL (ref 30.0–36.0)
MCV: 90.2 fL (ref 80.0–100.0)
Monocytes Absolute: 0.4 10*3/uL (ref 0.1–1.0)
Monocytes Relative: 8 %
Neutro Abs: 4.3 10*3/uL (ref 1.7–7.7)
Neutrophils Relative %: 72 %
Platelet Count: 209 10*3/uL (ref 150–400)
RBC: 3.06 MIL/uL — ABNORMAL LOW (ref 4.22–5.81)
RDW: 30.7 % — ABNORMAL HIGH (ref 11.5–15.5)
WBC Count: 5.8 10*3/uL (ref 4.0–10.5)
nRBC: 0 % (ref 0.0–0.2)

## 2024-01-23 LAB — CMP (CANCER CENTER ONLY)
ALT: 11 U/L (ref 0–44)
AST: 15 U/L (ref 15–41)
Albumin: 4.4 g/dL (ref 3.5–5.0)
Alkaline Phosphatase: 101 U/L (ref 38–126)
Anion gap: 11 (ref 5–15)
BUN: 38 mg/dL — ABNORMAL HIGH (ref 8–23)
CO2: 22 mmol/L (ref 22–32)
Calcium: 9.8 mg/dL (ref 8.9–10.3)
Chloride: 106 mmol/L (ref 98–111)
Creatinine: 1.54 mg/dL — ABNORMAL HIGH (ref 0.61–1.24)
GFR, Estimated: 46 mL/min — ABNORMAL LOW (ref >60)
Glucose, Bld: 125 mg/dL — ABNORMAL HIGH (ref 70–99)
Potassium: 4.7 mmol/L (ref 3.5–5.1)
Sodium: 139 mmol/L (ref 135–145)
Total Bilirubin: 2.9 mg/dL — ABNORMAL HIGH (ref 0.0–1.2)
Total Protein: 7.6 g/dL (ref 6.5–8.1)

## 2024-01-23 MED ORDER — DARBEPOETIN ALFA 500 MCG/ML IJ SOSY
500.0000 ug | PREFILLED_SYRINGE | Freq: Once | INTRAMUSCULAR | Status: AC
Start: 2024-01-23 — End: 2024-01-23
  Administered 2024-01-23: 500 ug via SUBCUTANEOUS
  Filled 2024-01-23: qty 1

## 2024-01-23 MED ORDER — LUSPATERCEPT-AAMT 75 MG ~~LOC~~ SOLR
1.0000 mg/kg | Freq: Once | SUBCUTANEOUS | Status: AC
  Administered 2024-01-23: 90 mg via SUBCUTANEOUS
  Filled 2024-01-23: qty 1.5

## 2024-01-23 NOTE — Progress Notes (Signed)
 [facility] CONSULT NOTE  Patient Care Team: Gracelyn Nurse, MD as PCP - General (Internal Medicine) Lanier Prude, MD as PCP - Electrophysiology (Cardiology) O'Neal, Ronnald Ramp, MD as PCP - Cardiology (Cardiology) Earna Coder, MD as Consulting Physician (Hematology and Oncology)  CHIEF COMPLAINTS/PURPOSE OF CONSULTATION: MDS  Oncology History Overview Note  # EGD-none; colonoscopy-never [Cologurad x2- NEG];FEB 2021- US- abdomen-no liver disease/ Mild splenic enlargement [460cc; kidney cysts]; B12 folic acid/LDH haptoglobin normal.  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   # Hx Of A.fib [OFF eliquis secondary to intracranial bleed]- s/p watchman device- on asprin ONLY- stable   # NGS/MOLECULAR TESTS:[facility] ON hem- May 2021- Cux-1; DNMT3A; SF3B**  # PALLIATIVE CARE EVALUATION: NA   DIAGNOSIS: MDS low risk   GOALS: Control  CURRENT/MOST RECENT THERAPY : Retacrit    MDS (myelodysplastic syndrome) (HCC)  02/18/2020 Initial Diagnosis   MDS (myelodysplastic syndrome), low grade (HCC)   01/01/2024 -  Chemotherapy   Patient is on Treatment Plan : MYELODYSPLASIA Luspatercept q21d       HISTORY OF PRESENTING ILLNESS: Ambulating in a wheelchair.  With wife.   Vernon [name] 78 y.o.  male with history of recently diagnosed low-grade MDS; A-fib status post Watchman device; off Eliquis [history of hemorrhagic stroke] currently on aranesp  is here for follow-up/review of the MRI.  Patient noted to have gaining weight.  Also complains of abdominal distention.  Noted to have  leg swelling.  Denies any worsening shortness of breath or cough.  Of note patient stopped taking his Lasix about 2 weeks ago.  Patient otherwise doing well.  Denies any bleeding or bruising.  Patient has not had any further strokes.   Review of Systems  Constitutional:  Positive for malaise/fatigue. Negative for chills, diaphoresis, fever and weight loss.  HENT:  Negative for nosebleeds and sore throat.   Eyes:  Negative for double vision.  Respiratory:  Negative for cough, hemoptysis, sputum production, shortness of breath and wheezing.   Cardiovascular:  Positive for leg swelling. Negative for chest pain, palpitations and orthopnea.  Gastrointestinal:  Negative for abdominal pain, blood in stool, constipation, diarrhea, heartburn, melena, nausea and vomiting.  Genitourinary:  Negative for dysuria, frequency and urgency.  Musculoskeletal:  Negative for back pain and joint pain.  Skin: Negative.  Negative for itching and rash.  Neurological:  Negative for dizziness, tingling, focal weakness, weakness and headaches.  Endo/Heme/Allergies:  Does not bruise/bleed easily.  Psychiatric/Behavioral:  Negative for depression. The patient is not nervous/anxious and does not have insomnia.     MEDICAL HISTORY:  Past Medical History:  Diagnosis Date   Anemia    Aortic stenosis    Arthritis    Complication of anesthesia    hard time getting bp up after knee replacement   Coronary artery disease    Diabetes mellitus without complication (HCC)    GERD (gastroesophageal reflux disease)    occ tums prn   History of hiatal hernia    Hypertension    MDS (myelodysplastic syndrome) (HCC)    Presence of Watchman left atrial appendage closure device 07/26/2022   27mm Watchman FLX with Dr. Lalla Brothers  SURGICAL HISTORY: Past Surgical History:  Procedure Laterality Date   CARPAL TUNNEL RELEASE  2012   CRANIOTOMY N/A 02/10/2022   Procedure: SUBOCCIPITAL CRANIECTOMY FOR EVACUATION OF CEREBELLAR  HEMATOMA;  Surgeon: Barnett Abu, MD;  Location: [facility] OR;  Service: Neurosurgery;  Laterality: N/A;   JOINT REPLACEMENT Right 2010   LEFT ATRIAL APPENDAGE OCCLUSION N/A 07/26/2022   Procedure: LEFT ATRIAL APPENDAGE OCCLUSION;  Surgeon: Lanier Prude, MD;  Location: [facility] INVASIVE CV LAB;  Service: Cardiovascular;  Laterality: N/A;   RIGHT/LEFT HEART CATH AND CORONARY ANGIOGRAPHY N/A 04/10/2023   Procedure: RIGHT/LEFT HEART CATH AND CORONARY ANGIOGRAPHY;  Surgeon: Marykay Lex, MD;  Location: [facility] INVASIVE CV LAB;  Service: Cardiovascular;  Laterality: N/A;   SHOULDER ARTHROSCOPY WITH ROTATOR CUFF REPAIR AND OPEN BICEPS TENODESIS Right 11/09/2019   Procedure: RIGHT SHOULDER ARTHROSCOPY WITH SUBSCAPULARIS REPAIR, SUBACROMIAL DECOMPRESSION,MINI OPEN ROTATOR CUFF REPAIR;  Surgeon: Signa Kell, MD;  Location: [facility] ORS;  Service: Orthopedics;  Laterality: Right;   TEE WITHOUT CARDIOVERSION N/A 07/26/2022   Procedure: TRANSESOPHAGEAL ECHOCARDIOGRAM (TEE);  Surgeon: Lanier Prude, MD;  Location: [facility] INVASIVE CV LAB;  Service: Cardiovascular;  Laterality: N/A;    SOCIAL HISTORY: Social History   Socioeconomic History   Marital status: Married    Spouse name: Not on file   Number of children: 2   Years of education: Not on file   Highest education level: Not on file  Occupational History   Occupation: Retired - Academic librarian  Tobacco Use   Smoking status: Former    Current packs/day: 0.00    Average packs/day: 0.5 packs/day for 8.0 years (4.0 ttl pk-yrs)    Types: Cigarettes    Start date: 07/21/1970    Quit date: 07/21/1978    Years since quitting: 45.5   Smokeless tobacco: Never  Vaping Use   Vaping status: Never Used  Substance and Sexual Activity   Alcohol use: Yes    Alcohol/week: 0.0 - 1.0 standard drinks of alcohol    Comment: rare beer   Drug use: Never   Sexual activity: Not on file  Other Topics Concern   Not on file  Social History Narrative   Lives in [place]; with  wife; quit smoking in early 59s; ocassional/ rare [may be 1 a month beer]. 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    Social Drivers of Corporate investment banker Strain: Not on file  Food Insecurity: No Food Insecurity (12/18/2022)   Hunger Vital Sign    Worried About Running Out of Food in the Last Year: Never true    Ran Out of Food in the Last Year: Never true  Transportation Needs: No Transportation Needs (12/18/2022)   PRAPARE - Administrator, Civil Service (Medical): No    Lack of Transportation (Non-Medical): No  Physical Activity: Not on file  Stress: Not on file  Social Connections: Not on file  Intimate Partner Violence: Not At Risk (12/18/2022)   Humiliation, Afraid, Rape, and Kick questionnaire    Fear of Current or Ex-Partner: No    Emotionally Abused: No    Physically Abused: No    Sexually Abused: No    FAMILY HISTORY: Family History  Problem Relation Age of Onset   Cancer Maternal Uncle     ALLERGIES:  has no known allergies.  MEDICATIONS:  Current Outpatient Medications  Medication Sig Dispense Refill   aspirin EC 81 MG tablet Take 1 tablet (81 mg total) by mouth daily. Swallow whole. 90 tablet  3   ezetimibe (ZETIA) 10 MG tablet Take 1 tablet (10 mg total) by mouth daily. 90 tablet 3   furosemide (LASIX) 20 MG tablet TAKE 0.5 TABLET DAILY. MAY TAKE EXTRA TABLET IF NEEDED (Patient taking differently: Taking half a tablet every 4 days) 90 tablet 1   metFORMIN (GLUCOPHAGE) 500 MG tablet Take 500 mg by mouth 2 (two) times daily with a meal.     Multiple Vitamin (MULTI-VITAMIN) tablet Take 1 tablet by mouth daily.     No current facility-administered medications for this visit.      PHYSICAL EXAMINATION:   Vitals:   01/23/24 0913  BP: 113/71  Pulse: (!) 57  Temp: (!) 97.2 F (36.2 C)  SpO2: 91%   Filed Weights   01/23/24 0913  Weight: 201 lb 8 oz (91.4 kg)   Positive for abdominal distention.  Positive for leg swelling bilaterally.   Pitting  edema Physical Exam HENT:     Head: Normocephalic and atraumatic.     Mouth/Throat:     Pharynx: No oropharyngeal exudate.  Eyes:     Pupils: Pupils are equal, round, and reactive to light.  Cardiovascular:     Rate and Rhythm: Normal rate and regular rhythm.     Heart sounds: Murmur heard.  Pulmonary:     Effort: Pulmonary effort is normal. No respiratory distress.     Breath sounds: Normal breath sounds. No wheezing.  Abdominal:     General: Bowel sounds are normal. There is no distension.     Palpations: Abdomen is soft. There is no mass.     Tenderness: There is no abdominal tenderness. There is no guarding or rebound.  Musculoskeletal:        General: No tenderness. Normal range of motion.     Cervical back: Normal range of motion and neck supple.  Skin:    General: Skin is warm.  Neurological:     Mental Status: He is alert and oriented to person, place, and time.  Psychiatric:        Mood and Affect: Affect normal.    LABORATORY DATA:  I have reviewed the data as listed Lab Results  Component Value Date   WBC 5.8 01/23/2024   HGB 8.8 (L) 01/23/2024   HCT 27.6 (L) 01/23/2024   MCV 90.2 01/23/2024   PLT 209 01/23/2024   Recent Labs    09/19/23 1413 12/11/23 1019 01/01/24 1442 01/23/24 0906  NA 139 140 139 139  K 4.4 4.8 4.7 4.7  CL 108 107 105 106  CO2 24 24 23 22   GLUCOSE 109* 120* 108* 125*  BUN 25* 28* 27* 38*  CREATININE 1.58* 1.34* 1.37* 1.54*  CALCIUM 9.0 9.3 9.2 9.8  GFRNONAA 45* 55* 53* 46*  PROT 7.0  --  7.4 7.6  ALBUMIN 4.3  --  4.4 4.4  AST 27  --  17 15  ALT 20  --  10 11  ALKPHOS 83  --  88 101  BILITOT 3.1*  --  2.9* 2.9*     MR ABDOMEN W WO CONTRAST Result Date: 01/22/2024 CLINICAL DATA:  Pancreatic lesions EXAM: MRI ABDOMEN WITHOUT AND WITH CONTRAST TECHNIQUE: Multiplanar multisequence MR imaging of the abdomen was performed both before and after the administration of intravenous contrast. CONTRAST:  9mL GADAVIST GADOBUTROL  1 MMOL/ML IV SOLN COMPARISON:  12/17/2022 FINDINGS: Lower chest: Small right pleural effusion. Hepatobiliary: No solid liver abnormality is seen. Coarse, nodular, cirrhotic morphology of the liver. Mild inversion of  the hepatic parenchymal signal on in and opposed phase imaging as well as T2 hypointensity of the parenchyma, consistent with hepatic parenchymal iron deposition. Of tiny gallstones. No gallbladder wall thickening, or biliary dilatation. Pancreas: Tiny fluid signal cystic lesion in the inferior pancreatic head measuring 0.6 cm (series 4, image 22) and in the dorsal pancreatic neck measuring 1.4 x 0.7 cm, unchanged (series 4, image 17). No solid component or suspicious contrast enhancement. No pancreatic ductal dilatation or surrounding inflammatory changes. Spleen: Mild splenomegaly, maximum coronal span 14.0 cm. Numerous tiny cysts or hemangiomata scattered throughout the spleen, requiring no specific further follow-up or characterization. Adrenals/Urinary Tract: Adrenal glands are unremarkable. Multiple simple, benign bilateral fluid signal renal cortical cysts as well as intrinsically T1 hyperintense hemorrhagic or proteinaceous cysts, requiring no further follow-up or characterization. No obvious calculi. No hydronephrosis. Stomach/Bowel: Stomach is within normal limits. No evidence of bowel wall thickening, distention, or inflammatory changes. Colonic diverticulosis. Vascular/Lymphatic: Aortic atherosclerosis. No enlarged abdominal lymph nodes. Other: No abdominal wall hernia or abnormality. Moderate volume ascites throughout the abdomen. Musculoskeletal: No acute or significant osseous findings. IMPRESSION: 1. Tiny fluid signal cystic lesion in the inferior pancreatic head measuring 0.6 cm and in the dorsal pancreatic neck measuring 1.4 x 0.7 cm, unchanged. No solid component or suspicious contrast enhancement. No pancreatic ductal dilatation or surrounding inflammatory changes. 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 Given small size, initially established imaging stability, and patient age, these are benign and no further follow-up or characterization is required. 2. Cirrhosis and mild splenomegaly. No suspicious liver lesions. Moderate volume ascites. 3. Hepatic parenchymal iron deposition. 4. Cholelithiasis. 5. Colonic diverticulosis. 6. Small right pleural effusion. Aortic Atherosclerosis (ICD10-I70.0). Electronically Signed   By: Jearld Lesch M.D.   On: 01/22/2024 17:00   MR 3D Recon At Scanner Result Date: 01/22/2024 CLINICAL DATA:  Pancreatic lesions EXAM: MRI ABDOMEN WITHOUT AND WITH CONTRAST TECHNIQUE: Multiplanar multisequence MR imaging of the abdomen was performed both before and after the administration of intravenous contrast. CONTRAST:  9mL GADAVIST GADOBUTROL 1 MMOL/ML IV SOLN COMPARISON:  12/17/2022 FINDINGS: Lower chest: Small right pleural effusion. Hepatobiliary: No solid liver abnormality is seen. Coarse, nodular, cirrhotic morphology of the liver. Mild inversion of the hepatic parenchymal signal on in and opposed phase imaging as well as T2 hypointensity of the parenchyma, consistent with hepatic parenchymal iron deposition. Of tiny gallstones. No gallbladder wall thickening, or biliary dilatation. Pancreas: Tiny fluid signal cystic lesion in the inferior pancreatic head measuring 0.6 cm (series 4, image 22) and in the dorsal pancreatic neck measuring 1.4 x 0.7 cm, unchanged (series 4, image 17). No solid component or suspicious contrast enhancement. No pancreatic ductal dilatation or surrounding inflammatory changes. Spleen: Mild splenomegaly, maximum coronal span 14.0 cm. Numerous tiny cysts or hemangiomata scattered throughout the spleen, requiring no specific further follow-up or characterization. Adrenals/Urinary Tract: Adrenal glands are unremarkable. Multiple simple, benign bilateral fluid signal renal cortical cysts as well as intrinsically T1  hyperintense hemorrhagic or proteinaceous cysts, requiring no further follow-up or characterization. No obvious calculi. No hydronephrosis. Stomach/Bowel: Stomach is within normal limits. No evidence of bowel wall thickening, distention, or inflammatory changes. Colonic diverticulosis. Vascular/Lymphatic: Aortic atherosclerosis. No enlarged abdominal lymph nodes. Other: No abdominal wall hernia or abnormality. Moderate volume ascites throughout the abdomen. Musculoskeletal: No acute or significant osseous findings. IMPRESSION: 1. Tiny fluid signal cystic lesion in the inferior pancreatic head measuring 0.6 cm and in the dorsal pancreatic neck measuring 1.4 x 0.7 cm, unchanged.  No solid component or suspicious contrast enhancement. No pancreatic ductal dilatation or surrounding inflammatory changes. 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 Given small size, initially established imaging stability, and patient age, these are benign and no further follow-up or characterization is required. 2. Cirrhosis and mild splenomegaly. No suspicious liver lesions. Moderate volume ascites. 3. Hepatic parenchymal iron deposition. 4. Cholelithiasis. 5. Colonic diverticulosis. 6. Small right pleural effusion. Aortic Atherosclerosis (ICD10-I70.0). Electronically Signed   By: Jearld Lesch M.D.   On: 01/22/2024 17:00    MDS (myelodysplastic syndrome) (HCC) #Low-grade myelodysplastic syndrome-refractory anemia with ringed sideroblasts.POSITIVE for SF3B1.  Patient currently on epo agents [since April 2021]; hemoglobin between 8-9.     # Proceed with  Luspatercept #1 today- Labs-CBC/chemistries were reviewed with the patient. Continue Aranesp for now.  # HTN- at home in 140s; BP- 160s- monitor closely.  stable  # Hx Of A.fib [OFF eliquis secondary to intracranial bleed]- s/p watchman device- on asprin ONLY- stable  # Stage kidney disease-stage III [dr.Korrapati]-stable  # Cirrhosis and mild splenomegaly-  Elevated bilirubin- [JAN 2024] Enzyme deficient- triggered by stress/UTI.  If recurrent/worse would recommend GI evaluation [Dr.Vanga] -  No suspicious liver lesions. Moderate volume ascites- reminded to take lasix as prescribed.   #  small side branch IPMNs. Given small size, initially established imaging stabilityFEB 2-25-fluid signal cystic lesion in the inferior pancreatic head measuring 0.6 cm and in the dorsal pancreatic neck measuring 1.4 x 0.7 cm, unchanged. No solid component or suspicious contrast enhancement. No pancreatic ductal dilatation or surrounding inflammatory changes. These are most consistent with, and patient age,  or characterization is required. No surveillance needed.   # DISPOSITION: wed preference # aranesp today;  Luspatercept today # in 3 weeks- MD: labs- cbc/cmp; ;  Luspatercept; possible Aranesp;  #  in 6 weeks- MD: labs- cbc/cmp; ;  Luspatercept; possible Aranesp; - Dr.B   All questions were answered. The patient knows to call the clinic with any problems, questions or concerns.    Earna Coder, MD 01/23/2024 10:27 AM

## 2024-01-23 NOTE — Assessment & Plan Note (Addendum)
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  Patient currently on epo agents [since April 2021]; hemoglobin between 8-9.     # Proceed with  Luspatercept #1 today- Labs-CBC/chemistries were reviewed with the patient. Continue Aranesp for now.  # HTN- at home in 140s; BP- 160s- monitor closely.  stable  # Hx Of A.fib [OFF eliquis secondary to intracranial bleed]- s/p watchman device- on asprin ONLY- stable  # Stage kidney disease-stage III [dr.Korrapati]-stable  # Cirrhosis and mild splenomegaly- Elevated bilirubin- [JAN 2024] Enzyme deficient- triggered by stress/UTI.  If recurrent/worse would recommend GI evaluation [Dr.Vanga] -  No suspicious liver lesions. Moderate volume ascites- reminded to take lasix as prescribed.   #  small side branch IPMNs. Given small size, initially established imaging stabilityFEB 2-25-fluid signal cystic lesion in the inferior pancreatic head measuring 0.6 cm and in the dorsal pancreatic neck measuring 1.4 x 0.7 cm, unchanged. No solid component or suspicious contrast enhancement. No pancreatic ductal dilatation or surrounding inflammatory changes. These are most consistent with, and patient age,  or characterization is required. No surveillance needed.   # DISPOSITION: wed preference # aranesp today;  Luspatercept today # in 3 weeks- MD: labs- cbc/cmp; ;  Luspatercept; possible Aranesp;  #  in 6 weeks- MD: labs- cbc/cmp; ;  Luspatercept; possible Aranesp; - Dr.B

## 2024-01-23 NOTE — Progress Notes (Signed)
 MRI 01/13/24.

## 2024-01-29 ENCOUNTER — Ambulatory Visit (HOSPITAL_COMMUNITY): Payer: Medicare Other

## 2024-02-05 ENCOUNTER — Ambulatory Visit: Payer: Medicare Other | Admitting: Physician Assistant

## 2024-02-10 ENCOUNTER — Other Ambulatory Visit: Payer: Self-pay | Admitting: Cardiovascular Disease

## 2024-02-12 ENCOUNTER — Other Ambulatory Visit: Payer: Medicare Other

## 2024-02-12 ENCOUNTER — Inpatient Hospital Stay (HOSPITAL_BASED_OUTPATIENT_CLINIC_OR_DEPARTMENT_OTHER): Payer: Medicare Other | Admitting: Internal Medicine

## 2024-02-12 ENCOUNTER — Encounter: Payer: Self-pay | Admitting: Internal Medicine

## 2024-02-12 ENCOUNTER — Inpatient Hospital Stay: Payer: Medicare Other | Attending: Internal Medicine

## 2024-02-12 ENCOUNTER — Ambulatory Visit: Payer: Medicare Other

## 2024-02-12 ENCOUNTER — Inpatient Hospital Stay: Payer: Medicare Other

## 2024-02-12 ENCOUNTER — Ambulatory Visit: Payer: Medicare Other | Admitting: Internal Medicine

## 2024-02-12 VITALS — BP 128/76 | HR 66 | Temp 97.2°F | Resp 18 | Wt 187.0 lb

## 2024-02-12 DIAGNOSIS — D469 Myelodysplastic syndrome, unspecified: Secondary | ICD-10-CM

## 2024-02-12 DIAGNOSIS — D4621 Refractory anemia with excess of blasts 1: Secondary | ICD-10-CM | POA: Insufficient documentation

## 2024-02-12 LAB — CMP (CANCER CENTER ONLY)
ALT: 12 U/L (ref 0–44)
AST: 20 U/L (ref 15–41)
Albumin: 4.4 g/dL (ref 3.5–5.0)
Alkaline Phosphatase: 99 U/L (ref 38–126)
Anion gap: 9 (ref 5–15)
BUN: 33 mg/dL — ABNORMAL HIGH (ref 8–23)
CO2: 25 mmol/L (ref 22–32)
Calcium: 9.1 mg/dL (ref 8.9–10.3)
Chloride: 101 mmol/L (ref 98–111)
Creatinine: 1.33 mg/dL — ABNORMAL HIGH (ref 0.61–1.24)
GFR, Estimated: 55 mL/min — ABNORMAL LOW (ref >60)
Glucose, Bld: 104 mg/dL — ABNORMAL HIGH (ref 70–99)
Potassium: 4 mmol/L (ref 3.5–5.1)
Sodium: 135 mmol/L (ref 135–145)
Total Bilirubin: 2.8 mg/dL — ABNORMAL HIGH (ref 0.0–1.2)
Total Protein: 7.7 g/dL (ref 6.5–8.1)

## 2024-02-12 LAB — CBC WITH DIFFERENTIAL (CANCER CENTER ONLY)
Abs Immature Granulocytes: 0.03 10*3/uL (ref 0.00–0.07)
Basophils Absolute: 0 10*3/uL (ref 0.0–0.1)
Basophils Relative: 1 %
Eosinophils Absolute: 0.3 10*3/uL (ref 0.0–0.5)
Eosinophils Relative: 5 %
HCT: 32.1 % — ABNORMAL LOW (ref 39.0–52.0)
Hemoglobin: 10.3 g/dL — ABNORMAL LOW (ref 13.0–17.0)
Immature Granulocytes: 1 %
Lymphocytes Relative: 28 %
Lymphs Abs: 1.6 10*3/uL (ref 0.7–4.0)
MCH: 29.2 pg (ref 26.0–34.0)
MCHC: 32.1 g/dL (ref 30.0–36.0)
MCV: 90.9 fL (ref 80.0–100.0)
Monocytes Absolute: 0.4 10*3/uL (ref 0.1–1.0)
Monocytes Relative: 8 %
Neutro Abs: 3.4 10*3/uL (ref 1.7–7.7)
Neutrophils Relative %: 57 %
Platelet Count: 266 10*3/uL (ref 150–400)
RBC: 3.53 MIL/uL — ABNORMAL LOW (ref 4.22–5.81)
RDW: 29.6 % — ABNORMAL HIGH (ref 11.5–15.5)
WBC Count: 5.9 10*3/uL (ref 4.0–10.5)
nRBC: 0.7 % — ABNORMAL HIGH (ref 0.0–0.2)

## 2024-02-12 MED ORDER — LUSPATERCEPT-AAMT 75 MG ~~LOC~~ SOLR
1.0000 mg/kg | Freq: Once | SUBCUTANEOUS | Status: AC
  Administered 2024-02-12: 90 mg via SUBCUTANEOUS
  Filled 2024-02-12: qty 0.5

## 2024-02-12 NOTE — Progress Notes (Signed)
 [facility] CONSULT NOTE  Patient Care Team: Gracelyn Nurse, MD as PCP - General (Internal Medicine) Lanier Prude, MD as PCP - Electrophysiology (Cardiology) O'Neal, Ronnald Ramp, MD as PCP - Cardiology (Cardiology) Earna Coder, MD as Consulting Physician (Hematology and Oncology)  CHIEF COMPLAINTS/PURPOSE OF CONSULTATION: MDS  Oncology History Overview Note  # EGD-none; colonoscopy-never [Cologurad x2- NEG];FEB 2021- US- abdomen-no liver disease/ Mild splenic enlargement [460cc; kidney cysts]; B12 folic acid/LDH haptoglobin normal.  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   # Hx Of A.fib [OFF eliquis secondary to intracranial bleed]- s/p watchman device- on asprin ONLY- stable   #  small side branch IPMNs. Given small size, initially established imaging stabilityFEB 2-25-fluid signal cystic lesion in the inferior pancreatic head measuring 0.6 cm and in the dorsal pancreatic neck measuring 1.4 x 0.7 cm, unchanged. No solid component or suspicious contrast enhancement. No pancreatic ductal dilatation or surrounding inflammatory changes. These are most consistent with, and patient age,  or characterization is required. No surveillance needed. 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  With wife.   Vernon [name] 78 y.o.  male with history of recently diagnosed low-grade MDS; A-fib status post Watchman device; off Eliquis [history of hemorrhagic stroke] currently on aranesp/Luspatercept-  Patient about 2 weeks ago his gout flared up bad. He wants to discuss taking allopurinol medication. He has also been taking some Nexium, which has been helping with his nausea.   Patient noted to have weight loss;/ improvement of abdominal distention/  leg swelling.  Denies any worsening shortness of breath or cough. Patient currently  taking his Lasix about 3 weeks ago.  Patient otherwise doing well.  Denies any bleeding or bruising.  Patient has not had any further strokes.   Review of Systems  Constitutional:  Positive for malaise/fatigue. Negative for chills, diaphoresis, fever and weight loss.  HENT:  Negative for nosebleeds and sore throat.   Eyes:  Negative for double vision.  Respiratory:  Negative for cough, hemoptysis, sputum production, shortness of breath and wheezing.   Cardiovascular:  Positive for leg swelling. Negative for chest pain, palpitations and orthopnea.  Gastrointestinal:  Negative for abdominal pain, blood in stool, constipation, diarrhea, heartburn, melena, nausea and vomiting.  Genitourinary:  Negative for dysuria, frequency and urgency.  Musculoskeletal:  Negative for back pain and joint pain.  Skin: Negative.  Negative for itching and rash.  Neurological:  Negative for dizziness, tingling, focal weakness, weakness and headaches.  Endo/Heme/Allergies:  Does not bruise/bleed easily.  Psychiatric/Behavioral:  Negative for depression. The patient is not nervous/anxious and does not have insomnia.     MEDICAL HISTORY:  Past Medical History:  Diagnosis Date   Anemia  Aortic stenosis    Arthritis     Complication of anesthesia    hard time getting bp up after knee replacement   Coronary artery disease    Diabetes mellitus without complication (HCC)    GERD (gastroesophageal reflux disease)    occ tums prn   History of hiatal hernia    Hypertension    MDS (myelodysplastic syndrome) (HCC)    Presence of Watchman left atrial appendage closure device 07/26/2022   27mm Watchman FLX with Dr. Lalla Brothers    SURGICAL HISTORY: Past Surgical History:  Procedure Laterality Date   CARPAL TUNNEL RELEASE  2012   CRANIOTOMY N/A 02/10/2022   Procedure: SUBOCCIPITAL CRANIECTOMY FOR EVACUATION OF CEREBELLAR HEMATOMA;  Surgeon: Barnett Abu, MD;  Location: [facility] OR;  Service: Neurosurgery;  Laterality: N/A;   JOINT REPLACEMENT Right 2010   LEFT ATRIAL APPENDAGE OCCLUSION N/A 07/26/2022   Procedure: LEFT ATRIAL APPENDAGE OCCLUSION;  Surgeon: Lanier Prude, MD;  Location: [facility] INVASIVE CV LAB;  Service: Cardiovascular;  Laterality: N/A;   RIGHT/LEFT HEART CATH AND CORONARY ANGIOGRAPHY N/A 04/10/2023   Procedure: RIGHT/LEFT HEART CATH AND CORONARY ANGIOGRAPHY;  Surgeon: Marykay Lex, MD;  Location: [facility] INVASIVE CV LAB;  Service: Cardiovascular;  Laterality: N/A;   SHOULDER ARTHROSCOPY WITH ROTATOR CUFF REPAIR AND OPEN BICEPS TENODESIS Right 11/09/2019   Procedure: RIGHT SHOULDER ARTHROSCOPY WITH SUBSCAPULARIS REPAIR, SUBACROMIAL DECOMPRESSION,MINI OPEN ROTATOR CUFF REPAIR;  Surgeon: Signa Kell, MD;  Location: [facility] ORS;  Service: Orthopedics;  Laterality: Right;   TEE WITHOUT CARDIOVERSION N/A 07/26/2022   Procedure: TRANSESOPHAGEAL ECHOCARDIOGRAM (TEE);  Surgeon: Lanier Prude, MD;  Location: [facility] INVASIVE CV LAB;  Service: Cardiovascular;  Laterality: N/A;    SOCIAL HISTORY: Social History   Socioeconomic History   Marital status: Married    Spouse name: Not on file   Number of children: 2   Years of education: Not on file   Highest education level: Not on file  Occupational History    Occupation: Retired - Academic librarian  Tobacco Use   Smoking status: Former    Current packs/day: 0.00    Average packs/day: 0.5 packs/day for 8.0 years (4.0 ttl pk-yrs)    Types: Cigarettes    Start date: 07/21/1970    Quit date: 07/21/1978    Years since quitting: 45.5   Smokeless tobacco: Never  Vaping Use   Vaping status: Never Used  Substance and Sexual Activity   Alcohol use: Yes    Alcohol/week: 0.0 - 1.0 standard drinks of alcohol    Comment: rare beer   Drug use: Never   Sexual activity: Not on file  Other Topics Concern   Not on file  Social History Narrative   Lives in [place]; with wife; quit smoking in early 36s; ocassional/ rare [may be 1 a month beer]. 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    Social Drivers of Corporate investment banker Strain: Not on file  Food Insecurity: No Food Insecurity (12/18/2022)   Hunger Vital Sign    Worried About Running Out of Food in the Last Year: Never true    Ran Out of Food in the Last Year: Never true  Transportation Needs: No Transportation Needs (12/18/2022)   PRAPARE - Administrator, Civil Service (Medical): No    Lack of Transportation (Non-Medical): No  Physical Activity: Not on file  Stress: Not on file  Social Connections: Not on file  Intimate Partner Violence: Not At Risk (12/18/2022)   Humiliation, Afraid, Rape,  and Kick questionnaire    Fear of Current or Ex-Partner: No    Emotionally Abused: No    Physically Abused: No    Sexually Abused: No    FAMILY HISTORY: Family History  Problem Relation Age of Onset   Cancer Maternal Uncle     ALLERGIES:  has no known allergies.  MEDICATIONS:  Current Outpatient Medications  Medication Sig Dispense Refill   aspirin EC 81 MG tablet Take 1 tablet (81 mg total) by mouth daily. Swallow whole. 90 tablet 3   ezetimibe (ZETIA) 10 MG tablet Take 1 tablet (10 mg total) by mouth daily. 90 tablet 3   furosemide (LASIX) 20 MG tablet TAKE 0.5 TABLET DAILY. MAY  TAKE EXTRA TABLET IF NEEDED (Patient taking differently: Taking half a tablet every 4 days) 90 tablet 1   metFORMIN (GLUCOPHAGE) 500 MG tablet Take 500 mg by mouth 2 (two) times daily with a meal.     Multiple Vitamin (MULTI-VITAMIN) tablet Take 1 tablet by mouth daily.     No current facility-administered medications for this visit.   Facility-Administered Medications Ordered in Other Visits  Medication Dose Route Frequency Provider Last Rate Last Admin   luspatercept-aamt (REBLOZYL) subcutaneous injection 90 mg  1 mg/kg (Treatment Plan Recorded) Subcutaneous Once Louretta Shorten R, MD          PHYSICAL EXAMINATION:   Vitals:   02/12/24 1501  BP: 128/76  Pulse: 66  Resp: 18  Temp: (!) 97.2 F (36.2 C)  SpO2: 99%    Filed Weights   02/12/24 1501  Weight: 187 lb (84.8 kg)    Positive for abdominal distention.  Positive for leg swelling bilaterally.  Pitting  edema Physical Exam HENT:     Head: Normocephalic and atraumatic.     Mouth/Throat:     Pharynx: No oropharyngeal exudate.  Eyes:     Pupils: Pupils are equal, round, and reactive to light.  Cardiovascular:     Rate and Rhythm: Normal rate and regular rhythm.     Heart sounds: Murmur heard.  Pulmonary:     Effort: Pulmonary effort is normal. No respiratory distress.     Breath sounds: Normal breath sounds. No wheezing.  Abdominal:     General: Bowel sounds are normal. There is no distension.     Palpations: Abdomen is soft. There is no mass.     Tenderness: There is no abdominal tenderness. There is no guarding or rebound.  Musculoskeletal:        General: No tenderness. Normal range of motion.     Cervical back: Normal range of motion and neck supple.  Skin:    General: Skin is warm.  Neurological:     Mental Status: He is alert and oriented to person, place, and time.  Psychiatric:        Mood and Affect: Affect normal.    LABORATORY DATA:  I have reviewed the data as listed Lab Results   Component Value Date   WBC 5.9 02/12/2024   HGB 10.3 (L) 02/12/2024   HCT 32.1 (L) 02/12/2024   MCV 90.9 02/12/2024   PLT 266 02/12/2024   Recent Labs    01/01/24 1442 01/23/24 0906 02/12/24 1433  NA 139 139 135  K 4.7 4.7 4.0  CL 105 106 101  CO2 23 22 25   GLUCOSE 108* 125* 104*  BUN 27* 38* 33*  CREATININE 1.37* 1.54* 1.33*  CALCIUM 9.2 9.8 9.1  GFRNONAA 53* 46* 55*  PROT 7.4 7.6 7.7  ALBUMIN 4.4 4.4 4.4  AST 17 15 20   ALT 10 11 12   ALKPHOS 88 101 99  BILITOT 2.9* 2.9* 2.8*     No results found.   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  Patient currently on epo agents [since April 2021]; hemoglobin between 8-9.     # Proceed with  Luspatercept #2 today- Labs-CBC/chemistries were reviewed with the patient. ok with Luspatercept today HOLD aranesp today;   # HTN- at home in 140s; BP- 160s- monitor closely.  stable  # Hx Of A.fib [OFF eliquis secondary to intracranial bleed]- s/p watchman device- on asprin ONLY- stable  # Hx of gout- recent falre- improved- continue allopurinol  # Stage kidney disease-stage III [dr.Korrapati]-stable.  #Wear compression stockings during daytime; taking the off at night; keep the legs elevated/under pillows while sleeping.  # Cirrhosis and mild splenomegaly- Elevated bilirubin- [JAN 2024] Enzyme deficient- triggered by stress/UTI.  If recurrent/worse would recommend GI evaluation [Dr.Vanga] -  No suspicious liver lesions. Moderate volume ascites- reminded to take lasix as prescribed.   # DISPOSITION: wed preference # ok with Luspatercept today HOLD aranesp today;   # in 3 weeks- MD: labs- cbc/cmp; ;  Luspatercept; possible Aranesp;  #  in 6 weeks- MD: labs- cbc/cmp; ;  Luspatercept; possible Aranesp; - Dr.B    All questions were answered. The patient knows to call the clinic with any problems, questions or concerns.    Earna Coder, MD 02/12/2024 3:50 PM

## 2024-02-12 NOTE — Assessment & Plan Note (Addendum)
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  Patient currently on epo agents [since April 2021]; hemoglobin between 8-9.     # Proceed with  Luspatercept #2 today- Labs-CBC/chemistries were reviewed with the patient. ok with Luspatercept today HOLD aranesp today;   # HTN- at home in 140s; BP- 160s- monitor closely.  stable  # Hx Of A.fib [OFF eliquis secondary to intracranial bleed]- s/p watchman device- on asprin ONLY- stable  # Hx of gout- recent falre- improved- continue allopurinol  # Stage kidney disease-stage III [dr.Korrapati]-stable.  #Wear compression stockings during daytime; taking the off at night; keep the legs elevated/under pillows while sleeping.  # Cirrhosis and mild splenomegaly- Elevated bilirubin- [JAN 2024] Enzyme deficient- triggered by stress/UTI.  If recurrent/worse would recommend GI evaluation [Dr.Vanga] -  No suspicious liver lesions. Moderate volume ascites- reminded to take lasix as prescribed.   # DISPOSITION: wed preference # ok with Luspatercept today HOLD aranesp today;   # in 3 weeks- MD: labs- cbc/cmp; ;  Luspatercept; possible Aranesp;  #  in 6 weeks- MD: labs- cbc/cmp; ;  Luspatercept; possible Aranesp; - Dr.B

## 2024-02-12 NOTE — Progress Notes (Signed)
 Patient about 2 weeks ago his gout flared up bad. He wants to discuss taking alapurinol medication, he wants to make sure it is ok if he takes it.  He has also been taking some Nexium, which has been helping with his nausea.

## 2024-02-16 IMAGING — DX DG CHEST 1V PORT
1 series · 1 of 1 positions shown · non-contrast
Comparison: Portable chest yesterday at [DATE] a.m.

CLINICAL DATA: Right pleural effusion.

EXAM:
PORTABLE CHEST 1 VIEW

[chest ap]
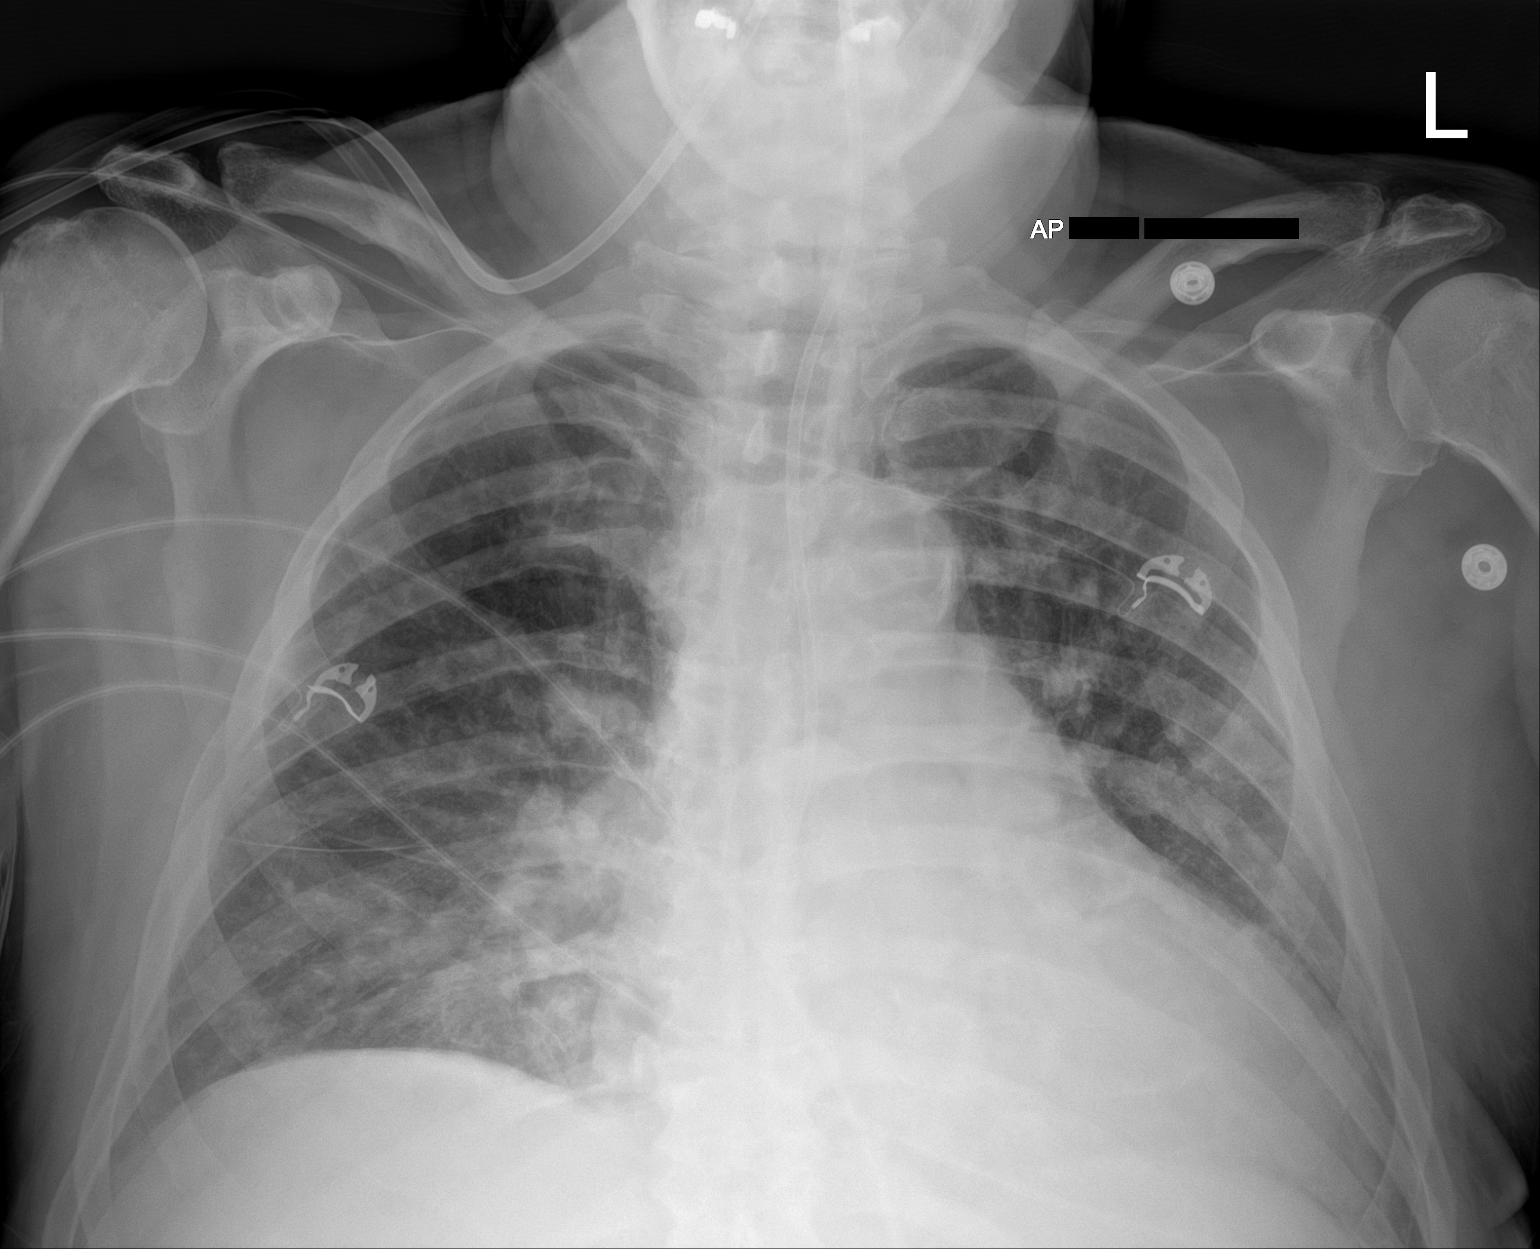

[1 of 1 positions shown; findings below may reference images not displayed]

FINDINGS: [DATE] a.m., 02/16/2022. Interval removal right IJ line. No
pneumothorax is seen.

Feeding tube enters the stomach with the intragastric course not
included in the exam.

The heart is enlarged. Mild perihilar vascular congestion continues
to be seen with central and basilar interstitial edema.

There are increased perihilar and lower zonal ill-defined opacities
consistent with alveolar edema, pneumonia or combination, with small
symmetric underlying pleural effusions.

There is a stable mediastinum with aortic atherosclerosis. There is
thoracic spondylosis.
IMPRESSION: 1. Interval removal right IJ central line.
2. Feeding tube enters the stomach with the intragastric course not
seen.
3. Perihilar vascular congestion with worsening ill-defined
perihilar and lower zonal opacities consistent with edema, pneumonia
or combination. Background interstitial edema.

## 2024-02-18 IMAGING — CT CT HEAD W/O CM
2 series · 15 of 30 positions shown, 17 images · non-contrast
Comparison: 02/15/2022.

CLINICAL DATA: Hemorrhagic stroke



[Series 4: head without · axial · non-contrast · 0.42mm/px · z∈[-118,+2]mm · 7 of 34 slices shown, 9 images]
[im 5/34  brain]
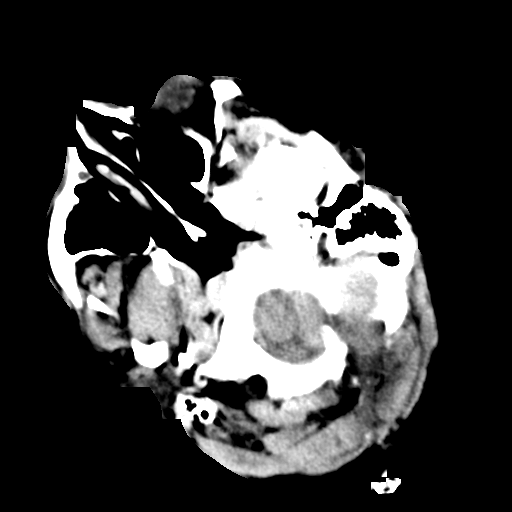
[im 5/34  bone]
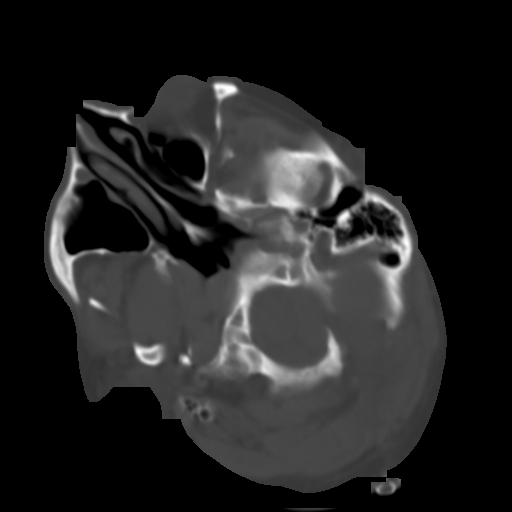
[im 9/34  brain]
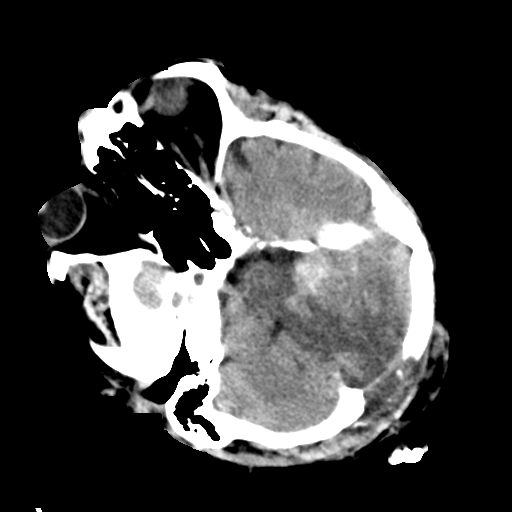
[im 13/34  brain]
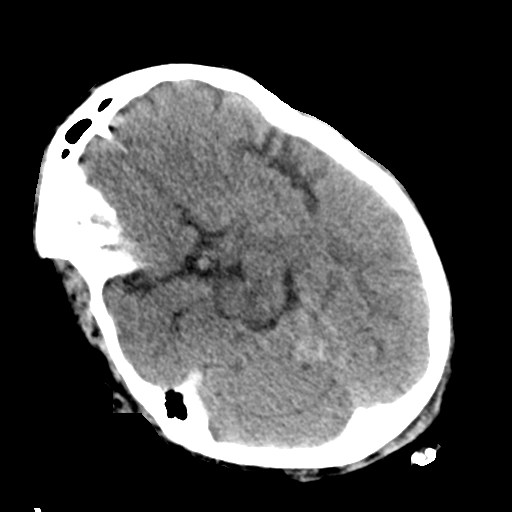
[im 17/34  brain]
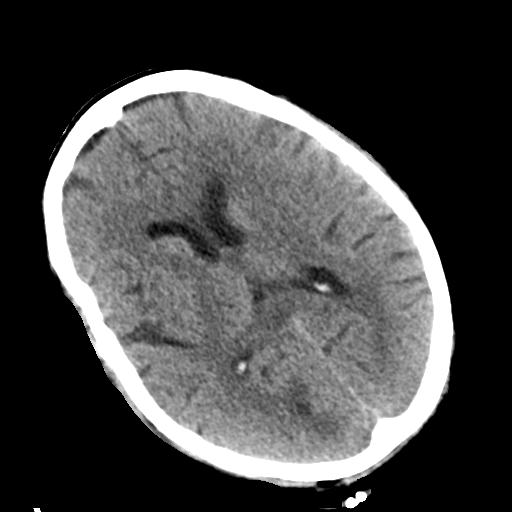
[im 21/34  brain]
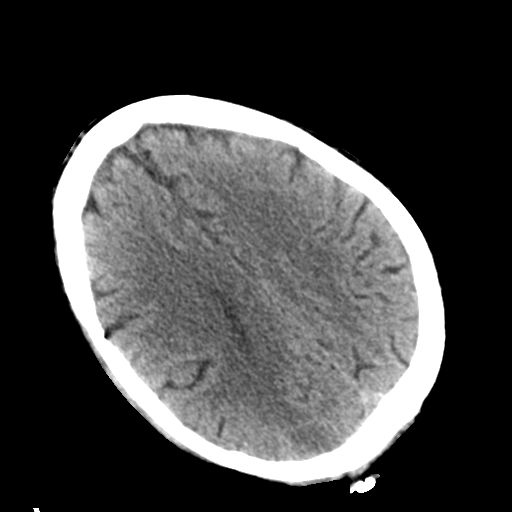
[im 21/34  bone]
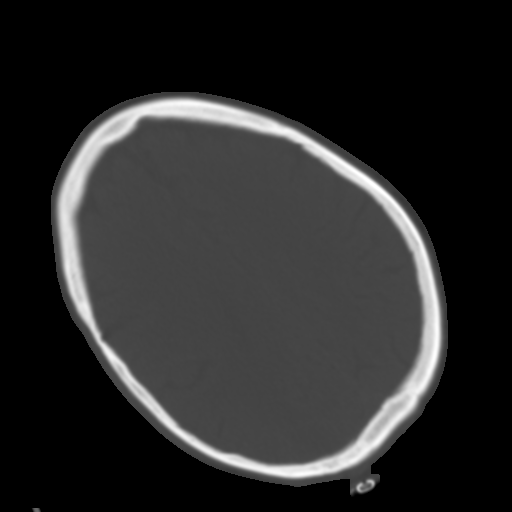
[im 25/34  brain]
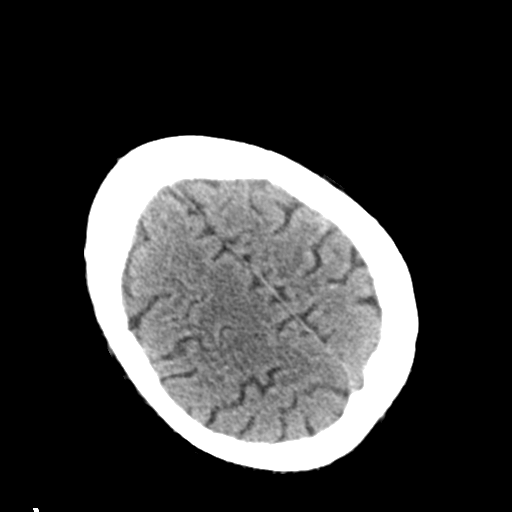
[im 29/34  brain]
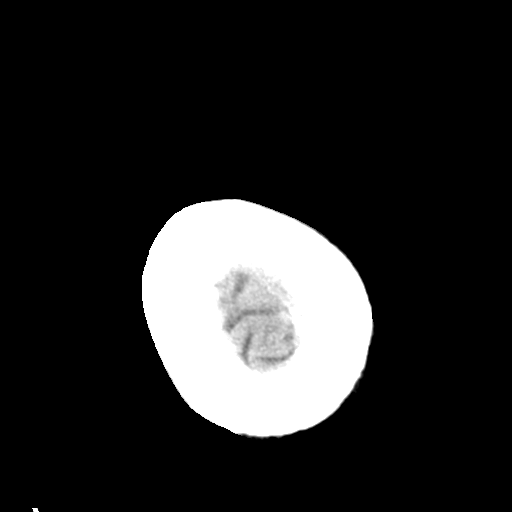

[Series 5: head bone · axial · 0.42mm/px · z∈[-122,+12]mm · 8 of 85 slices shown]
[im 9/85  bone]
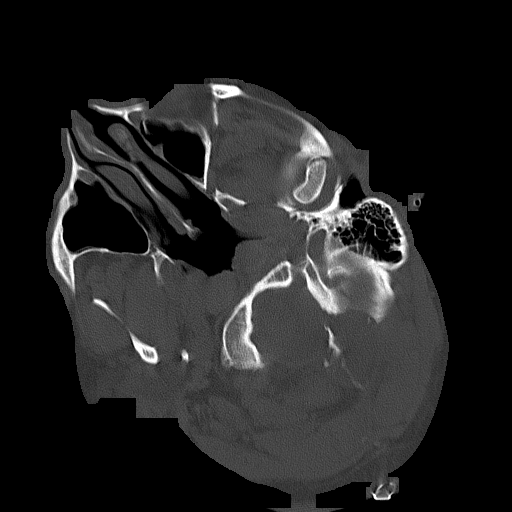
[im 17/85  bone]
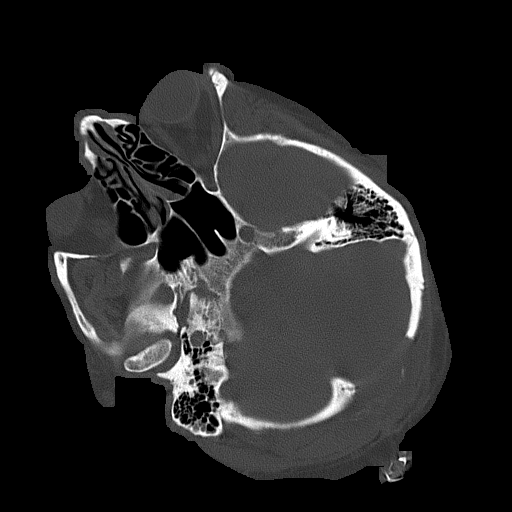
[im 26/85  bone]
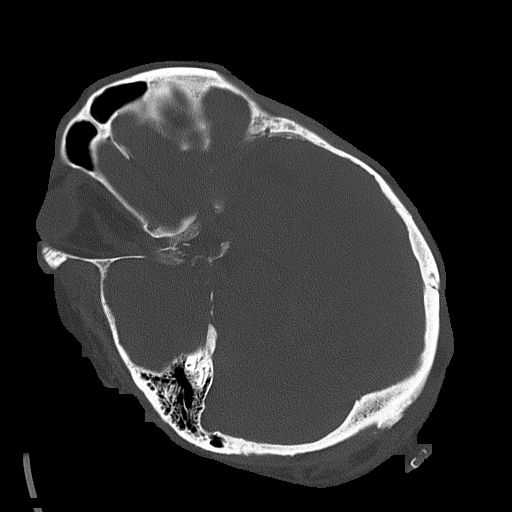
[im 38/85  bone]
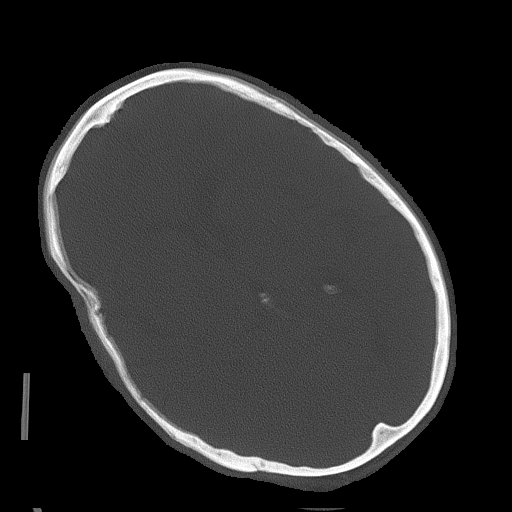
[im 47/85  bone]
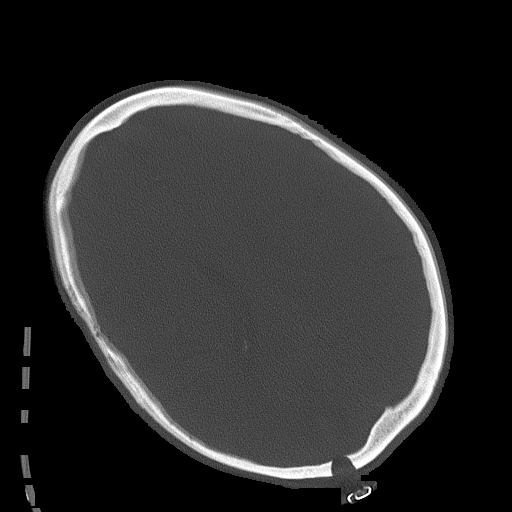
[im 59/85  bone]
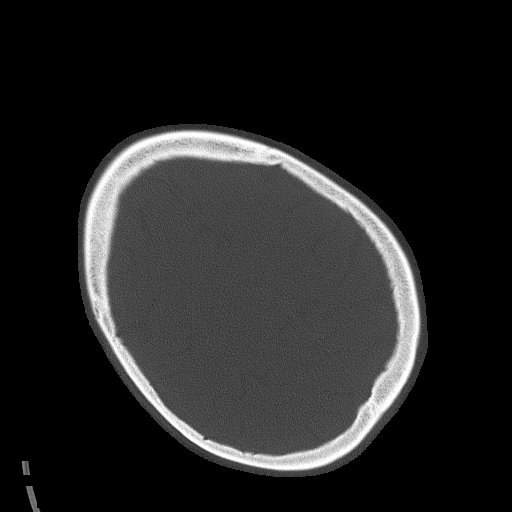
[im 68/85  bone]
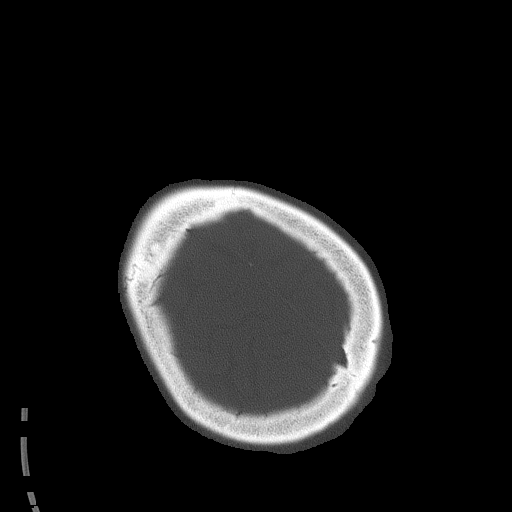
[im 76/85  bone]
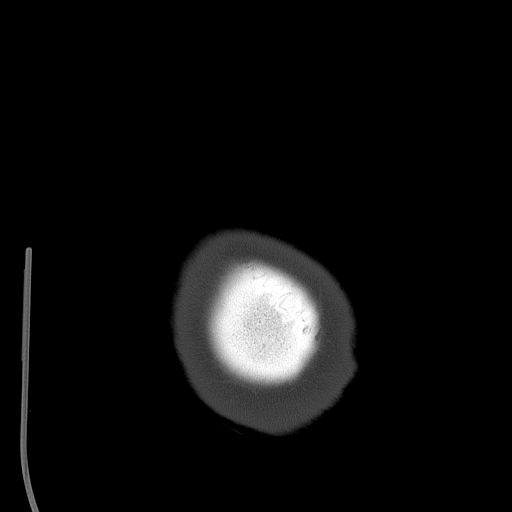

[15 of 30 positions shown; findings below may reference images not displayed]

FINDINGS: Brain: Left superior cerebellar edema and hemorrhage, which are
slightly decreased in size and conspicuity. Decreased mass effect on
the fourth ventricle, which is more visible on this exam.
Redemonstrated hemorrhage in the right occipital ventriculostomy
tract.

Redemonstrated hypodensity in the right parietal lobe, along the
prior ventriculostomy tract, with a small amount of hemorrhage again
noted, also slightly decreased in conspicuity.

No hydrocephalus or acute extra-axial collection. No acute infarct,
mass, or cerebral midline shift.

Vascular: No hyperdense vessel.

Skull: Left occipital craniectomy. Right parietal burr hole. No
acute osseous abnormality.

Sinuses/Orbits: Minimal mucosal thickening in the left maxillary
sinus. Otherwise negative. The orbits are unremarkable.

Other: The mastoids are well aerated.
IMPRESSION: 1. Decreased size and conspicuity of the left superior cerebellar
hemorrhage with associated edema, with slightly decreased mass
effect on the fourth ventricle.
2. Redemonstrated hemorrhage in the right occipital ventriculostomy
tract.
3. Slightly decreased density of hemorrhage in the right parietal
ventriculostomy tract, consistent with evolution of blood products.

## 2024-02-21 ENCOUNTER — Ambulatory Visit (HOSPITAL_COMMUNITY): Attending: Cardiovascular Disease

## 2024-02-21 DIAGNOSIS — I350 Nonrheumatic aortic (valve) stenosis: Secondary | ICD-10-CM | POA: Insufficient documentation

## 2024-02-23 LAB — ECHOCARDIOGRAM COMPLETE
AR max vel: 1.22 cm2
AV Area VTI: 1.16 cm2
AV Area mean vel: 1.04 cm2
AV Mean grad: 25 mmHg
AV Peak grad: 41.2 mmHg
Ao pk vel: 3.21 m/s
Est EF: 50
MV M vel: 5.78 m/s
MV Peak grad: 133.6 mmHg
S' Lateral: 4.3 cm

## 2024-02-24 ENCOUNTER — Encounter: Payer: Self-pay | Admitting: Cardiovascular Disease

## 2024-02-24 ENCOUNTER — Encounter: Payer: Self-pay | Admitting: Physician Assistant

## 2024-02-24 ENCOUNTER — Ambulatory Visit: Attending: Physician Assistant | Admitting: Physician Assistant

## 2024-02-24 VITALS — BP 150/90 | HR 60 | Ht 69.0 in | Wt 185.0 lb

## 2024-02-24 DIAGNOSIS — I350 Nonrheumatic aortic (valve) stenosis: Secondary | ICD-10-CM | POA: Diagnosis not present

## 2024-02-24 DIAGNOSIS — I4821 Permanent atrial fibrillation: Secondary | ICD-10-CM

## 2024-02-24 DIAGNOSIS — I2510 Atherosclerotic heart disease of native coronary artery without angina pectoris: Secondary | ICD-10-CM

## 2024-02-24 DIAGNOSIS — I10 Essential (primary) hypertension: Secondary | ICD-10-CM

## 2024-02-24 DIAGNOSIS — E785 Hyperlipidemia, unspecified: Secondary | ICD-10-CM

## 2024-02-24 DIAGNOSIS — I614 Nontraumatic intracerebral hemorrhage in cerebellum: Secondary | ICD-10-CM

## 2024-02-24 MED ORDER — EZETIMIBE 10 MG PO TABS: 10.0000 mg | ORAL_TABLET | Freq: Every day | ORAL | 3 refills | Status: AC

## 2024-02-24 NOTE — Progress Notes (Unsigned)
 Cardiology Office Note:  .   Date:  02/26/2024  ID:  Vernon [name], DOB 02-Feb-1946, MRN [id number] PCP: Gracelyn Nurse, MD  [facility] Providers Cardiologist:  Reatha Harps, MD Electrophysiologist:  Lanier Prude, MD     History of Present Illness: .   Vernon [name] is a 78 y.o. male with past medical history of permanent A-fib, CAD, aortic stenosis, hypertension, hyperlipidemia, h/o intracranial hemorrhage and myelodysplastic syndrome.  Patient had a intracranial hemorrhage in March 2023 with left-sided deficit.  He underwent Watchman procedure in August 2023.  Cardiac CT obtained in preparation for Watchman device showed coronary calcium score of > 5000.  PET stress test obtained on 02/26/2023 was negative for ischemia or infarction, there is some evidence of microvascular disease, EF 45%.  Subsequent cardiac catheterization performed on 04/10/2023 showed 40% proximal RCA lesion, 25% ostial to proximal LAD lesion, 40% mid LAD lesion, 20% proximal to mid left circumflex lesion, moderate pulmonary hypertension, moderate aortic stenosis.  Aortic valve gradient 23 mmHg.  LVEDP 18 mmHg.  Wedge pressure 25 mmHg.  PAP pressure 37 mmHg.  Patient was last seen by Dr. Flora Lipps on 11/04/2023 at which time he was volume overloaded, this was felt to be due to inconsistent Lasix use due to concern for impact on hemoglobin level.  He did have significant shortness of breath.  He was on Lasix 10 mg every fourth day.  It was recommended for him to continue with leg elevation compression stocking.  Patient was advised to take extra dose of Lasix however he did not wish to do it.  He was on aspirin due to Watchman device.  He was not on anticoagulation therapy.  Repeat echocardiogram obtained on 02/21/2024 showed EF 50%, no regional wall motion abnormality, mildly reduced RV systolic function, severe biatrial enlargement, moderate MR, mild AI, moderate aortic stenosis, small pericardial effusion.  Patient  presents today for follow-up.  He denies any chest pain or shortness of breath.  He has no lower extremity edema after starting on the compression stocking recently.  His lung is clear.  He still gets short of breath walking long distance due to chronic left lower extremity weakness which make him expand more energy when he walks.  Otherwise, he has no new issues.  Blood pressure is elevated on arrival, however previously has always been in the 110s to 120s.  Even manual blood pressure recheck by myself was 150/90.  I decided to hold off on adding blood pressure medication for a single days elevated blood pressure.  He can follow-up with Dr. Flora Lipps in July.  ROS:   He denies chest pain, palpitations, dyspnea, pnd, orthopnea, n, v, dizziness, syncope, edema, weight gain, or early satiety. All other systems reviewed and are otherwise negative except as noted above.    Studies Reviewed: .        Cardiac Studies & Procedures   ______________________________________________________________________________________________ CARDIAC CATHETERIZATION  CARDIAC CATHETERIZATION 04/10/2023  Narrative   Prox RCA lesion is 40% stenosed.   Ost LAD to Prox LAD lesion is 25% stenosed.  Mid LAD lesion is 40% stenosed.   Prox Cx to Mid Cx lesion is 20% stenosed.   LV end diastolic pressure is mildly elevated.   Hemodynamic findings consistent with moderate pulmonary hypertension.   There is moderate aortic valve stenosis.   POST-CATH DIAGNOSES: Angiographically diffuse mild disease but no obstructive disease. Aortic valve gradient (peak to peak 23 mmHg) likely ~moderate Aortic Stenosis Right Heart Cath Pressures  show mildly elevated LVEDP (18 mmHg) but moderate to severely elevated PCWP (mean 25 mmHg) and large V wave up to 45 mmHg; mean PAP 37 mmHg.Marland Kitchen Small caliber Right Radial but courses all the way up to the subclavian artery before joint pain, unable to perform Physician to the radial artery to to the  caliber of the vessel.  RECOMMENDATIONS Continue to monitor aortic valve.  Aggressive risk factor management of CAD.   Vernon Lemma, MD  Findings Coronary Findings Diagnostic  Dominance: Right  Left Anterior Descending Vessel is large. Ost LAD to Prox LAD lesion is 25% stenosed. The lesion is focal and discrete. Mid LAD lesion is 40% stenosed.  First Diagonal Branch Vessel is small in size.  Lateral First Diagonal Branch Vessel is small in size.  Second Diagonal Branch Vessel is large in size.  Third Diagonal Branch Vessel is small in size.  Left Circumflex Vessel is moderate in size. Prox Cx to Mid Cx lesion is 20% stenosed.  Second Obtuse Marginal Branch Vessel is small in size.  Left Posterior Atrioventricular Artery Vessel is small in size.  Right Coronary Artery Vessel was injected. Vessel is large. There is mild diffuse disease throughout the vessel. Prox RCA lesion is 40% stenosed.  Intervention  No interventions have been documented.   STRESS TESTS  NM PET CT CARDIAC PERFUSION MULTI W/ABSOLUTE BLOODFLOW 02/26/2023  Narrative CLINICAL DATA:  This over-read does not include interpretation of cardiac or coronary anatomy or pathology. Over-read does not include interpretation of the PET portion of the exam. The Cardiac PET CT interpretation by the cardiologist is attached.  COMPARISON:  Cardiac CT dated September 26, 2022  FINDINGS: Vascular: Cardiomegaly. Small pericardial effusion, similar to prior exam. Normal caliber thoracic aorta with severe calcified plaque. Severe left main and three-vessel coronary artery calcifications. Aortic valve calcifications. Left atrial appendage occlusion device.  Mediastinum/Nodes: Esophagus is unremarkable. No enlarged lymph nodes seen in the chest.  Lungs/Pleura: Bibasilar atelectasis. No consolidation, pleural effusion or pneumothorax.  Upper Abdomen: No acute abnormality.  Musculoskeletal: No aggressive  appearing osseous lesions  IMPRESSION: No acute extracardiac abnormality.   Electronically Signed By: Vernon [name] M.D. On: 02/26/2023 15:12   ECHOCARDIOGRAM  ECHOCARDIOGRAM COMPLETE 02/21/2024  Narrative ECHOCARDIOGRAM REPORT    Patient Name:   Vernon [name] Date of Exam: 02/21/2024 Medical Rec #:  [id number]     Height:       69.0 in Accession #:    [id number]    Weight:       187.0 lb Date of Birth:  [birth year]     BSA:          2.008 m Patient Age:    77 years      BP:           128/76 mmHg Patient Gender: M             HR:           48 bpm. Exam Location:  [address]  Procedure: 2D Echo, Cardiac Doppler, Color Doppler and 3D Echo (Both Spectral and Color Flow Doppler were utilized during procedure).  Indications:    Aortic stenosis I35.0  History:        Patient has prior history of Echocardiogram examinations, most recent 02/18/2023. CAD, Watchman; Risk Factors:Diabetes and Hypertension.  Sonographer:    Thurman Coyer RDCS Referring Phys: [id number] [facility] [place]  IMPRESSIONS   1. Left ventricular ejection fraction, by estimation, is 50%. Left ventricular ejection fraction by  3D volume is 53 %. The left ventricle has low normal function. The left ventricle has no regional wall motion abnormalities. There is mild left ventricular hypertrophy. Left ventricular diastolic parameters are indeterminate. 2. Right ventricular systolic function is mildly reduced. The right ventricular size is mildly enlarged. Tricuspid regurgitation signal is inadequate for assessing PA pressure. 3. Left atrial size was severely dilated. 4. Right atrial size was severely dilated. 5. A small pericardial effusion is present. 6. The mitral valve is degenerative. Moderate mitral valve regurgitation. No evidence of mitral stenosis. 7. The aortic valve is tricuspid. There is severe calcifcation of the aortic valve. Aortic valve regurgitation is mild. Moderate aortic valve stenosis.  Aortic valve area, by VTI measures 1.16 cm. Aortic valve mean gradient measures 25.0 mmHg. Aortic valve Vmax measures 3.21 m/s. 8. Ascending aorta measurements are within normal limits for age when indexed to body surface area. 9. The inferior vena cava is normal in size with greater than 50% respiratory variability, suggesting right atrial pressure of 3 mmHg.  Comparison(s): Prior images reviewed side by side. LVEF has decreased on side by side comparison. MR similar. AS similar.  FINDINGS Left Ventricle: Left ventricular ejection fraction, by estimation, is 50%. Left ventricular ejection fraction by 3D volume is 53 %. The left ventricle has low normal function. The left ventricle has no regional wall motion abnormalities. The left ventricular internal cavity size was normal in size. There is mild left ventricular hypertrophy. Left ventricular diastolic parameters are indeterminate.  Right Ventricle: The right ventricular size is mildly enlarged. No increase in right ventricular wall thickness. Right ventricular systolic function is mildly reduced. Tricuspid regurgitation signal is inadequate for assessing PA pressure.  Left Atrium: Left atrial size was severely dilated.  Right Atrium: Right atrial size was severely dilated.  Pericardium: A small pericardial effusion is present.  Mitral Valve: The mitral valve is degenerative in appearance. Mild to moderate mitral annular calcification. Moderate mitral valve regurgitation. No evidence of mitral valve stenosis. The mean mitral valve gradient is 1.6 mmHg.  Tricuspid Valve: The tricuspid valve is normal in structure. Tricuspid valve regurgitation is mild . No evidence of tricuspid stenosis.  Aortic Valve: The aortic valve is tricuspid. There is severe calcifcation of the aortic valve. Aortic valve regurgitation is mild. Moderate aortic stenosis is present. Aortic valve mean gradient measures 25.0 mmHg. Aortic valve peak gradient measures 41.2  mmHg. Aortic valve area, by VTI measures 1.16 cm.  Pulmonic Valve: The pulmonic valve was normal in structure. Pulmonic valve regurgitation is trivial. No evidence of pulmonic stenosis.  Aorta: The aortic root is normal in size and structure. Ascending aorta measurements are within normal limits for age when indexed to body surface area.  Venous: The inferior vena cava is normal in size with greater than 50% respiratory variability, suggesting right atrial pressure of 3 mmHg.  IAS/Shunts: No atrial level shunt detected by color flow Doppler.  Additional Comments: 3D was performed not requiring image post processing on an independent workstation and was normal.   LEFT VENTRICLE PLAX 2D LVIDd:         5.90 cm LVIDs:         4.30 cm LV PW:         1.10 cm         3D Volume EF LV IVS:        1.00 cm         LV 3D EF:    Left LVOT diam:  2.20 cm                      ventricul LV SV:         87                           ar LV SV Index:   43                           ejection LVOT Area:     3.80 cm                     fraction by 3D volume is 53 %.  3D Volume EF: 3D EF:        53 % LV EDV:       180 ml LV ESV:       86 ml LV SV:        95 ml  RIGHT VENTRICLE            IVC RV Basal diam:  4.60 cm    IVC diam: 2.00 cm RV Mid diam:    3.90 cm RV S prime:     9.32 cm/s TAPSE (M-mode): 1.8 cm  LEFT ATRIUM              Index        RIGHT ATRIUM           Index LA diam:        6.00 cm  2.99 cm/m   RA Area:     24.10 cm LA Vol (A2C):   123.0 ml 61.26 ml/m  RA Volume:   64.40 ml  32.07 ml/m LA Vol (A4C):   146.0 ml 72.72 ml/m LA Biplane Vol: 138.0 ml 68.73 ml/m AORTIC VALVE AV Area (Vmax):    1.22 cm AV Area (Vmean):   1.04 cm AV Area (VTI):     1.16 cm AV Vmax:           321.00 cm/s AV Vmean:          230.000 cm/s AV VTI:            0.744 m AV Peak Grad:      41.2 mmHg AV Mean Grad:      25.0 mmHg LVOT Vmax:         103.00 cm/s LVOT Vmean:        62.900  cm/s LVOT VTI:          0.228 m LVOT/AV VTI ratio: 0.31  AORTA Ao Root diam: 3.40 cm Ao Asc diam:  4.00 cm  MITRAL VALVE MV Mean grad: 1.6 mmHg    SHUNTS MR Peak grad: 133.6 mmHg  Systemic VTI:  0.23 m MR Mean grad: 87.0 mmHg   Systemic Diam: 2.20 cm MR Vmax:      578.00 cm/s MR Vmean:     440.0 cm/s  Vernon Brass MD Electronically signed by Vernon Brass MD Signature Date/Time: 02/23/2024/4:31:25 PM    Final   TEE  ECHO TEE 07/26/2022  Narrative TRANSESOPHOGEAL ECHO REPORT    Patient Name:   Vernon [name] Date of Exam: 07/26/2022 Medical Rec #:  [id number]     Height:       69.0 in Accession #:    [id number]    Weight:       200.0 lb Date of Birth:  [birth year]  BSA:          2.066 m Patient Age:    76 years      BP:           152/88 mmHg Patient Gender: M             HR:           67 bpm. Exam Location:  Inpatient  Procedure: Transesophageal Echo, 3D Echo, Cardiac Doppler and Color Doppler  Indications:     I48.91* Unspeicified atrial fibrillation  History:         Patient has prior history of Echocardiogram examinations. Abnormal ECG, Stroke, Aortic Valve Disease, Arrythmias:Atrial Fibrillation; Risk Factors:Diabetes and Dyslipidemia.  Sonographer:     Sheralyn Boatman RDCS Referring Phys:  [id number] Lanier Prude Diagnosing Phys: Charlton Haws MD  PROCEDURE: After discussion of the risks and benefits of a TEE, an informed consent was obtained from the patient. The transesophogeal probe was passed without difficulty through the esophogus of the patient. Imaged were obtained with the patient in a supine position. Sedation performed by different physician. The patient was monitored while under deep sedation. Anesthestetic sedation was provided intravenously by Anesthesiology: 160mg  of Propofol, 100mg  of Lidocaine. The patient's vital signs; including heart rate, blood pressure, and oxygen saturation; remained stable throughout the procedure. The patient developed  no complications during the procedure.  IMPRESSIONS   1. Well positioned 27 mm FLX Watchman device 1/3 rd mitral shoulder Negative tug test No color flow leak. Average compression 22%. 2. Left ventricular ejection fraction, by estimation, is 55 to 60%. The left ventricle has normal function. 3. Right ventricular systolic function is normal. The right ventricular size is normal. 4. Left atrial size was moderately dilated. No left atrial/left atrial appendage thrombus was detected. 5. Right atrial size was moderately dilated. 6. No change in trivial RV anterior effusion post procedure. 7. The mitral valve is abnormal. Mild to moderate mitral valve regurgitation. 8. Tricuspid valve regurgitation is mild to moderate. 9. The aortic valve is tricuspid. There is moderate calcification of the aortic valve. There is moderate thickening of the aortic valve. Aortic valve regurgitation is not visualized. Mild aortic valve stenosis. 10. Evidence of atrial level shunting detected by color flow Doppler. Post trans septal left to right shunt only.  FINDINGS Left Ventricle: Left ventricular ejection fraction, by estimation, is 55 to 60%. The left ventricle has normal function. The left ventricular internal cavity size was normal in size.  Right Ventricle: The right ventricular size is normal. No increase in right ventricular wall thickness. Right ventricular systolic function is normal.  Left Atrium: Left atrial size was moderately dilated. No left atrial/left atrial appendage thrombus was detected.  Right Atrium: Right atrial size was moderately dilated.  Pericardium: No change in trivial RV anterior effusion post procedure. Trivial pericardial effusion is present. The pericardial effusion is anterior to the right ventricle.  Mitral Valve: The mitral valve is abnormal. Mild to moderate mitral valve regurgitation.  Tricuspid Valve: The tricuspid valve is normal in structure. Tricuspid valve regurgitation  is mild to moderate.  Aortic Valve: The aortic valve is tricuspid. There is moderate calcification of the aortic valve. There is moderate thickening of the aortic valve. Aortic valve regurgitation is not visualized. Mild aortic stenosis is present. Aortic valve mean gradient measures 3.0 mmHg. Aortic valve peak gradient measures 6.6 mmHg.  Pulmonic Valve: The pulmonic valve was not assessed. Pulmonic valve regurgitation is not visualized.  Aorta: The aortic root is normal  in size and structure.  IAS/Shunts: Evidence of atrial level shunting detected by color flow Doppler. Post trans septal left to right shunt only.  Additional Comments: Well positioned 27 mm FLX Watchman device 1/3 rd mitral shoulder Negative tug test No color flow leak. Average compression 22%.   AORTIC VALVE AV Vmax:           128.00 cm/s AV Vmean:          79.600 cm/s AV VTI:            0.246 m AV Peak Grad:      6.6 mmHg AV Mean Grad:      3.0 mmHg LVOT Vmax:         102.00 cm/s LVOT Vmean:        65.500 cm/s LVOT VTI:          0.156 m LVOT/AV VTI ratio: 0.63   SHUNTS Systemic VTI: 0.16 m  Charlton Haws MD Electronically signed by Charlton Haws MD Signature Date/Time: 07/26/2022/10:03:33 AM    Final        ______________________________________________________________________________________________      Risk Assessment/Calculations:    CHA2DS2-VASc Score = 3   This indicates a 3.2% annual risk of stroke. The patient's score is based upon: CHF History: 0 HTN History: 1 Diabetes History: 0 Stroke History: 0 Vascular Disease History: 0 Age Score: 2 Gender Score: 0           Physical Exam:   VS:  BP (!) 150/90   Pulse 60   Ht 5\' 9"  (1.753 m)   Wt 185 lb (83.9 kg)   SpO2 95%   BMI 27.32 kg/m    Wt Readings from Last 3 Encounters:  02/24/24 185 lb (83.9 kg)  02/12/24 187 lb (84.8 kg)  01/23/24 201 lb 8 oz (91.4 kg)    GEN: Well nourished, well developed in no acute distress NECK:  No JVD; No carotid bruits CARDIAC: Irregular, no murmurs, rubs, gallops RESPIRATORY:  Clear to auscultation without rales, wheezing or rhonchi  ABDOMEN: Soft, non-tender, non-distended EXTREMITIES:  No edema; No deformity   ASSESSMENT AND PLAN: .    Permanent atrial Fibrillation (AFib) Chronic AFib with irregular heartbeat. Underwent Watchman procedure in August 2023, allowing discontinuation of anticoagulants due to previous cerebral hemorrhage. Heart rate is well controlled.  Aortic Stenosis Moderate aortic stenosis with aortic valve gradient of 23 mmHg. Calcified valve is moderately tight without significant symptoms. Echocardiogram shows no change from last year. Surgical intervention not required unless stenosis becomes severe with symptoms.  CAD: Denies any recent chest pain  Hypertension Blood pressure elevated at 164/90 today, but previous readings in the 120s. Considered an isolated incident, possibly situational. No consistent hypertension noted.  Hyperlipidemia: Continue Zetia.  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 Improvement in hand sensitivity with acupuncture, but arm and leg weakness persist, increasing energy expenditure during activities. - Continue physical therapy and rehabilitation exercises - Monitor for any changes in neurological status        Dispo: Follow-up with Dr. Flora Lipps in July  Signed, Azalee Course, [place]

## 2024-02-24 NOTE — Patient Instructions (Signed)
 Medication Instructions:  NO CHANGES *If you need a refill on your cardiac medications before your next appointment, please call your pharmacy*  Lab Work: NO LABS If you have labs (blood work) drawn today and your tests are completely normal, you will receive your results only by: MyChart Message (if you have MyChart) OR A paper copy in the mail If you have any lab test that is abnormal or we need to change your treatment, we will call you to review the results.  Testing/Procedures: NO TESTING  Follow-Up: At [facility], you and your health needs are our priority.  As part of our continuing mission to provide you with exceptional heart care, our providers are all part of one team.  This team includes your primary Cardiologist (physician) and Advanced Practice Providers or APPs (Physician Assistants and Nurse Practitioners) who all work together to provide you with the care you need, when you need it.  Your next appointment:   4 month(s)  Provider:   Reatha Harps, MD   Other Instructions   1st Floor: - Lobby - Registration  - Pharmacy  - Lab - Cafe  2nd Floor: - PV Lab - Diagnostic Testing (echo, CT, nuclear med)  3rd Floor: - Vacant  4th Floor: - TCTS (cardiothoracic surgery) - AFib Clinic - Structural Heart Clinic - Vascular Surgery  - Vascular Ultrasound  5th Floor: - HeartCare Cardiology (general and EP) - Clinical Pharmacy for coumadin, hypertension, lipid, weight-loss medications, and med management appointments    Valet parking services will be available as well.

## 2024-02-25 ENCOUNTER — Other Ambulatory Visit: Payer: Self-pay

## 2024-02-25 ENCOUNTER — Other Ambulatory Visit: Payer: Self-pay | Admitting: Cardiovascular Disease

## 2024-03-04 ENCOUNTER — Inpatient Hospital Stay (HOSPITAL_BASED_OUTPATIENT_CLINIC_OR_DEPARTMENT_OTHER): Payer: Medicare Other | Admitting: Internal Medicine

## 2024-03-04 ENCOUNTER — Inpatient Hospital Stay: Payer: Medicare Other

## 2024-03-04 ENCOUNTER — Encounter: Payer: Self-pay | Admitting: Internal Medicine

## 2024-03-04 ENCOUNTER — Inpatient Hospital Stay: Payer: Medicare Other | Attending: Internal Medicine

## 2024-03-04 VITALS — BP 146/74 | HR 55 | Temp 98.0°F | Wt 187.4 lb

## 2024-03-04 DIAGNOSIS — D469 Myelodysplastic syndrome, unspecified: Secondary | ICD-10-CM

## 2024-03-04 DIAGNOSIS — D461 Refractory anemia with ring sideroblasts: Secondary | ICD-10-CM | POA: Insufficient documentation

## 2024-03-04 LAB — CMP (CANCER CENTER ONLY)
ALT: 17 U/L (ref 0–44)
AST: 21 U/L (ref 15–41)
Albumin: 4.3 g/dL (ref 3.5–5.0)
Alkaline Phosphatase: 110 U/L (ref 38–126)
Anion gap: 9 (ref 5–15)
BUN: 39 mg/dL — ABNORMAL HIGH (ref 8–23)
CO2: 24 mmol/L (ref 22–32)
Calcium: 9.1 mg/dL (ref 8.9–10.3)
Chloride: 103 mmol/L (ref 98–111)
Creatinine: 1.38 mg/dL — ABNORMAL HIGH (ref 0.61–1.24)
GFR, Estimated: 53 mL/min — ABNORMAL LOW (ref >60)
Glucose, Bld: 105 mg/dL — ABNORMAL HIGH (ref 70–99)
Potassium: 4.2 mmol/L (ref 3.5–5.1)
Sodium: 136 mmol/L (ref 135–145)
Total Bilirubin: 2.9 mg/dL — ABNORMAL HIGH (ref 0.0–1.2)
Total Protein: 7.2 g/dL (ref 6.5–8.1)

## 2024-03-04 LAB — CBC WITH DIFFERENTIAL (CANCER CENTER ONLY)
Abs Immature Granulocytes: 0.1 10*3/uL — ABNORMAL HIGH (ref 0.00–0.07)
Basophils Absolute: 0 10*3/uL (ref 0.0–0.1)
Basophils Relative: 1 %
Eosinophils Absolute: 0.2 10*3/uL (ref 0.0–0.5)
Eosinophils Relative: 3 %
HCT: 30.6 % — ABNORMAL LOW (ref 39.0–52.0)
Hemoglobin: 9.9 g/dL — ABNORMAL LOW (ref 13.0–17.0)
Immature Granulocytes: 1 %
Lymphocytes Relative: 18 %
Lymphs Abs: 1.2 10*3/uL (ref 0.7–4.0)
MCH: 30 pg (ref 26.0–34.0)
MCHC: 32.4 g/dL (ref 30.0–36.0)
MCV: 92.7 fL (ref 80.0–100.0)
Monocytes Absolute: 0.5 10*3/uL (ref 0.1–1.0)
Monocytes Relative: 8 %
Neutro Abs: 4.9 10*3/uL (ref 1.7–7.7)
Neutrophils Relative %: 69 %
Platelet Count: 240 10*3/uL (ref 150–400)
RBC: 3.3 MIL/uL — ABNORMAL LOW (ref 4.22–5.81)
RDW: 29.3 % — ABNORMAL HIGH (ref 11.5–15.5)
WBC Count: 7 10*3/uL (ref 4.0–10.5)
nRBC: 1.1 % — ABNORMAL HIGH (ref 0.0–0.2)

## 2024-03-04 MED ORDER — LUSPATERCEPT-AAMT 75 MG ~~LOC~~ SOLR
1.0000 mg/kg | Freq: Once | SUBCUTANEOUS | Status: AC
  Administered 2024-03-04: 90 mg via SUBCUTANEOUS
  Filled 2024-03-04: qty 0.5

## 2024-03-04 NOTE — Progress Notes (Signed)
 [facility] CONSULT NOTE  Patient Care Team: Gracelyn Nurse, MD as PCP - General (Internal Medicine) Lanier Prude, MD as PCP - Electrophysiology (Cardiology) O'Neal, Ronnald Ramp, MD as PCP - Cardiology (Cardiology) Earna Coder, MD as Consulting Physician (Hematology and Oncology)  CHIEF COMPLAINTS/PURPOSE OF CONSULTATION: MDS  Oncology History Overview Note  # EGD-none; colonoscopy-never [Cologurad x2- NEG];FEB 2021- US- abdomen-no liver disease/ Mild splenic enlargement [460cc; kidney cysts]; B12 folic acid/LDH haptoglobin normal.  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   # Hx Of A.fib [OFF eliquis secondary to intracranial bleed]- s/p watchman device- on asprin ONLY- stable   #  small side branch IPMNs. Given small size, initially established imaging stabilityFEB 2-25-fluid signal cystic lesion in the inferior pancreatic head measuring 0.6 cm and in the dorsal pancreatic neck measuring 1.4 x 0.7 cm, unchanged. No solid component or suspicious contrast enhancement. No pancreatic ductal dilatation or surrounding inflammatory changes. These are most consistent with, and patient age,  or characterization is required. No surveillance needed. 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  With wife.   Lenoard Aden 78 y.o.  male with history of recently diagnosed low-grade MDS; A-fib status post Watchman device; off Eliquis [history of hemorrhagic stroke] currently on aranesp/Luspatercept-  Patient about 2 weeks ago his gout flared up bad. He wants to discuss taking allopurinol medication. He has also been taking some Nexium, which has been helping with his nausea.   Patient noted to have weight loss;/ improvement of abdominal distention/  leg swelling.  Denies any worsening shortness of breath or cough. Patient currently  taking his Lasix about 3 weeks ago.  Patient otherwise doing well.  Denies any bleeding or bruising.  Patient has not had any further strokes.   Review of Systems  Constitutional:  Positive for malaise/fatigue. Negative for chills, diaphoresis, fever and weight loss.  HENT:  Negative for nosebleeds and sore throat.   Eyes:  Negative for double vision.  Respiratory:  Negative for cough, hemoptysis, sputum production, shortness of breath and wheezing.   Cardiovascular:  Positive for leg swelling. Negative for chest pain, palpitations and orthopnea.  Gastrointestinal:  Negative for abdominal pain, blood in stool, constipation, diarrhea, heartburn, melena, nausea and vomiting.  Genitourinary:  Negative for dysuria, frequency and urgency.  Musculoskeletal:  Negative for back pain and joint pain.  Skin: Negative.  Negative for itching and rash.  Neurological:  Negative for dizziness, tingling, focal weakness, weakness and headaches.  Endo/Heme/Allergies:  Does not bruise/bleed easily.  Psychiatric/Behavioral:  Negative for depression. The patient is not nervous/anxious and does not have insomnia.     MEDICAL HISTORY:  Past Medical History:  Diagnosis Date   Anemia  Aortic stenosis    Arthritis     Complication of anesthesia    hard time getting bp up after knee replacement   Coronary artery disease    Diabetes mellitus without complication (HCC)    GERD (gastroesophageal reflux disease)    occ tums prn   History of hiatal hernia    Hypertension    MDS (myelodysplastic syndrome) (HCC)    Presence of Watchman left atrial appendage closure device 07/26/2022   27mm Watchman FLX with Dr. Lalla Brothers    SURGICAL HISTORY: Past Surgical History:  Procedure Laterality Date   CARPAL TUNNEL RELEASE  2012   CRANIOTOMY N/A 02/10/2022   Procedure: SUBOCCIPITAL CRANIECTOMY FOR EVACUATION OF CEREBELLAR HEMATOMA;  Surgeon: Barnett Abu, MD;  Location: [facility] OR;  Service: Neurosurgery;  Laterality: N/A;   JOINT REPLACEMENT Right 2010   LEFT ATRIAL APPENDAGE OCCLUSION N/A 07/26/2022   Procedure: LEFT ATRIAL APPENDAGE OCCLUSION;  Surgeon: Lanier Prude, MD;  Location: [facility] INVASIVE CV LAB;  Service: Cardiovascular;  Laterality: N/A;   RIGHT/LEFT HEART CATH AND CORONARY ANGIOGRAPHY N/A 04/10/2023   Procedure: RIGHT/LEFT HEART CATH AND CORONARY ANGIOGRAPHY;  Surgeon: Marykay Lex, MD;  Location: [facility] INVASIVE CV LAB;  Service: Cardiovascular;  Laterality: N/A;   SHOULDER ARTHROSCOPY WITH ROTATOR CUFF REPAIR AND OPEN BICEPS TENODESIS Right 11/09/2019   Procedure: RIGHT SHOULDER ARTHROSCOPY WITH SUBSCAPULARIS REPAIR, SUBACROMIAL DECOMPRESSION,MINI OPEN ROTATOR CUFF REPAIR;  Surgeon: Signa Kell, MD;  Location: [facility] ORS;  Service: Orthopedics;  Laterality: Right;   TEE WITHOUT CARDIOVERSION N/A 07/26/2022   Procedure: TRANSESOPHAGEAL ECHOCARDIOGRAM (TEE);  Surgeon: Lanier Prude, MD;  Location: [facility] INVASIVE CV LAB;  Service: Cardiovascular;  Laterality: N/A;    SOCIAL HISTORY: Social History   Socioeconomic History   Marital status: Married    Spouse name: Not on file   Number of children: 2   Years of education: Not on file   Highest education level: Not on file  Occupational History    Occupation: Retired - Academic librarian  Tobacco Use   Smoking status: Former    Current packs/day: 0.00    Average packs/day: 0.5 packs/day for 8.0 years (4.0 ttl pk-yrs)    Types: Cigarettes    Start date: 07/21/1970    Quit date: 07/21/1978    Years since quitting: 45.6   Smokeless tobacco: Never  Vaping Use   Vaping status: Never Used  Substance and Sexual Activity   Alcohol use: Yes    Alcohol/week: 0.0 - 1.0 standard drinks of alcohol    Comment: rare beer   Drug use: Never   Sexual activity: Not on file  Other Topics Concern   Not on file  Social History Narrative   Lives in [place]; with wife; quit smoking in early 5s; ocassional/ rare [may be 1 a month beer]. 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    Social Drivers of Corporate investment banker Strain: Not on file  Food Insecurity: No Food Insecurity (12/18/2022)   Hunger Vital Sign    Worried About Running Out of Food in the Last Year: Never true    Ran Out of Food in the Last Year: Never true  Transportation Needs: No Transportation Needs (12/18/2022)   PRAPARE - Administrator, Civil Service (Medical): No    Lack of Transportation (Non-Medical): No  Physical Activity: Not on file  Stress: Not on file  Social Connections: Not on file  Intimate Partner Violence: Not At Risk (12/18/2022)   Humiliation, Afraid, Rape,  and Kick questionnaire    Fear of Current or Ex-Partner: No    Emotionally Abused: No    Physically Abused: No    Sexually Abused: No    FAMILY HISTORY: Family History  Problem Relation Age of Onset   Cancer Maternal Uncle     ALLERGIES:  has no known allergies.  MEDICATIONS:  Current Outpatient Medications  Medication Sig Dispense Refill   aspirin EC 81 MG tablet Take 1 tablet (81 mg total) by mouth daily. Swallow whole. 90 tablet 3   ezetimibe (ZETIA) 10 MG tablet Take 1 tablet (10 mg total) by mouth daily. (Patient taking differently: Take 10 mg by mouth daily. 0.5 tab Mon and Th  am) 90 tablet 3   metFORMIN (GLUCOPHAGE) 500 MG tablet Take 500 mg by mouth 2 (two) times daily with a meal.     Multiple Vitamin (MULTI-VITAMIN) tablet Take 1 tablet by mouth daily.     furosemide (LASIX) 20 MG tablet TAKE 0.5 TABLET DAILY. MAY TAKE EXTRA TABLET IF NEEDED (Patient not taking: Reported on 03/04/2024) 90 tablet 3   No current facility-administered medications for this visit.      PHYSICAL EXAMINATION:   Vitals:   03/04/24 1400  BP: (!) 146/74  Pulse: (!) 55  Temp: 98 F (36.7 C)    Filed Weights   03/04/24 1400  Weight: 187 lb 6.4 oz (85 kg)    Positive for abdominal distention.  Positive for leg swelling bilaterally.  Pitting  edema Physical Exam HENT:     Head: Normocephalic and atraumatic.     Mouth/Throat:     Pharynx: No oropharyngeal exudate.  Eyes:     Pupils: Pupils are equal, round, and reactive to light.  Cardiovascular:     Rate and Rhythm: Normal rate and regular rhythm.     Heart sounds: Murmur heard.  Pulmonary:     Effort: Pulmonary effort is normal. No respiratory distress.     Breath sounds: Normal breath sounds. No wheezing.  Abdominal:     General: Bowel sounds are normal. There is no distension.     Palpations: Abdomen is soft. There is no mass.     Tenderness: There is no abdominal tenderness. There is no guarding or rebound.  Musculoskeletal:        General: No tenderness. Normal range of motion.     Cervical back: Normal range of motion and neck supple.  Skin:    General: Skin is warm.  Neurological:     Mental Status: He is alert and oriented to person, place, and time.  Psychiatric:        Mood and Affect: Affect normal.    LABORATORY DATA:  I have reviewed the data as listed Lab Results  Component Value Date   WBC 7.0 03/04/2024   HGB 9.9 (L) 03/04/2024   HCT 30.6 (L) 03/04/2024   MCV 92.7 03/04/2024   PLT 240 03/04/2024   Recent Labs    01/23/24 0906 02/12/24 1433 03/04/24 1445  NA 139 135 136  K 4.7 4.0  4.2  CL 106 101 103  CO2 22 25 24   GLUCOSE 125* 104* 105*  BUN 38* 33* 39*  CREATININE 1.54* 1.33* 1.38*  CALCIUM 9.8 9.1 9.1  GFRNONAA 46* 55* 53*  PROT 7.6 7.7 7.2  ALBUMIN 4.4 4.4 4.3  AST 15 20 21   ALT 11 12 17   ALKPHOS 101 99 110  BILITOT 2.9* 2.8* 2.9*     ECHOCARDIOGRAM COMPLETE Result Date: 02/23/2024  ECHOCARDIOGRAM REPORT   Patient Name:   EULOGIO REQUENA Date of Exam: 02/21/2024 Medical Rec #:  [id number]     Height:       69.0 in Accession #:    [id number]    Weight:       187.0 lb Date of Birth:  Jun 22, 1946     BSA:          2.008 m Patient Age:    77 years      BP:           128/76 mmHg Patient Gender: M             HR:           48 bpm. Exam Location:  [address] Procedure: 2D Echo, Cardiac Doppler, Color Doppler and 3D Echo (Both Spectral            and Color Flow Doppler were utilized during procedure). Indications:    Aortic stenosis I35.0  History:        Patient has prior history of Echocardiogram examinations, most                 recent 02/18/2023. CAD, Watchman; Risk Factors:Diabetes and                 Hypertension.  Sonographer:    Thurman Coyer RDCS Referring Phys: [id number] [facility] O'NEAL IMPRESSIONS  1. Left ventricular ejection fraction, by estimation, is 50%. Left ventricular ejection fraction by 3D volume is 53 %. The left ventricle has low normal function. The left ventricle has no regional wall motion abnormalities. There is mild left ventricular hypertrophy. Left ventricular diastolic parameters are indeterminate.  2. Right ventricular systolic function is mildly reduced. The right ventricular size is mildly enlarged. Tricuspid regurgitation signal is inadequate for assessing PA pressure.  3. Left atrial size was severely dilated.  4. Right atrial size was severely dilated.  5. A small pericardial effusion is present.  6. The mitral valve is degenerative. Moderate mitral valve regurgitation. No evidence of mitral stenosis.  7. The aortic valve is tricuspid.  There is severe calcifcation of the aortic valve. Aortic valve regurgitation is mild. Moderate aortic valve stenosis. Aortic valve area, by VTI measures 1.16 cm. Aortic valve mean gradient measures 25.0 mmHg. Aortic valve Vmax measures 3.21 m/s.  8. Ascending aorta measurements are within normal limits for age when indexed to body surface area.  9. The inferior vena cava is normal in size with greater than 50% respiratory variability, suggesting right atrial pressure of 3 mmHg. Comparison(s): Prior images reviewed side by side. LVEF has decreased on side by side comparison. MR similar. AS similar. FINDINGS  Left Ventricle: Left ventricular ejection fraction, by estimation, is 50%. Left ventricular ejection fraction by 3D volume is 53 %. The left ventricle has low normal function. The left ventricle has no regional wall motion abnormalities. The left ventricular internal cavity size was normal in size. There is mild left ventricular hypertrophy. Left ventricular diastolic parameters are indeterminate. Right Ventricle: The right ventricular size is mildly enlarged. No increase in right ventricular wall thickness. Right ventricular systolic function is mildly reduced. Tricuspid regurgitation signal is inadequate for assessing PA pressure. Left Atrium: Left atrial size was severely dilated. Right Atrium: Right atrial size was severely dilated. Pericardium: A small pericardial effusion is present. Mitral Valve: The mitral valve is degenerative in appearance. Mild to moderate mitral annular calcification. Moderate mitral valve regurgitation. No evidence of mitral valve stenosis. The mean  mitral valve gradient is 1.6 mmHg. Tricuspid Valve: The tricuspid valve is normal in structure. Tricuspid valve regurgitation is mild . No evidence of tricuspid stenosis. Aortic Valve: The aortic valve is tricuspid. There is severe calcifcation of the aortic valve. Aortic valve regurgitation is mild. Moderate aortic stenosis is present.  Aortic valve mean gradient measures 25.0 mmHg. Aortic valve peak gradient measures 41.2 mmHg. Aortic valve area, by VTI measures 1.16 cm. Pulmonic Valve: The pulmonic valve was normal in structure. Pulmonic valve regurgitation is trivial. No evidence of pulmonic stenosis. Aorta: The aortic root is normal in size and structure. Ascending aorta measurements are within normal limits for age when indexed to body surface area. Venous: The inferior vena cava is normal in size with greater than 50% respiratory variability, suggesting right atrial pressure of 3 mmHg. IAS/Shunts: No atrial level shunt detected by color flow Doppler. Additional Comments: 3D was performed not requiring image post processing on an independent workstation and was normal.  LEFT VENTRICLE PLAX 2D LVIDd:         5.90 cm LVIDs:         4.30 cm LV PW:         1.10 cm         3D Volume EF LV IVS:        1.00 cm         LV 3D EF:    Left LVOT diam:     2.20 cm                      ventricul LV SV:         87                           ar LV SV Index:   43                           ejection LVOT Area:     3.80 cm                     fraction                                             by 3D                                             volume is                                             53 %.                                 3D Volume EF:                                3D EF:        53 %  LV EDV:       180 ml                                LV ESV:       86 ml                                LV SV:        95 ml RIGHT VENTRICLE            IVC RV Basal diam:  4.60 cm    IVC diam: 2.00 cm RV Mid diam:    3.90 cm RV S prime:     9.32 cm/s TAPSE (M-mode): 1.8 cm LEFT ATRIUM              Index        RIGHT ATRIUM           Index LA diam:        6.00 cm  2.99 cm/m   RA Area:     24.10 cm LA Vol (A2C):   123.0 ml 61.26 ml/m  RA Volume:   64.40 ml  32.07 ml/m LA Vol (A4C):   146.0 ml 72.72 ml/m LA Biplane Vol: 138.0 ml 68.73  ml/m  AORTIC VALVE AV Area (Vmax):    1.22 cm AV Area (Vmean):   1.04 cm AV Area (VTI):     1.16 cm AV Vmax:           321.00 cm/s AV Vmean:          230.000 cm/s AV VTI:            0.744 m AV Peak Grad:      41.2 mmHg AV Mean Grad:      25.0 mmHg LVOT Vmax:         103.00 cm/s LVOT Vmean:        62.900 cm/s LVOT VTI:          0.228 m LVOT/AV VTI ratio: 0.31  AORTA Ao Root diam: 3.40 cm Ao Asc diam:  4.00 cm MITRAL VALVE MV Mean grad: 1.6 mmHg    SHUNTS MR Peak grad: 133.6 mmHg  Systemic VTI:  0.23 m MR Mean grad: 87.0 mmHg   Systemic Diam: 2.20 cm MR Vmax:      578.00 cm/s MR Vmean:     440.0 cm/s Weston Brass MD Electronically signed by Weston Brass MD Signature Date/Time: 02/23/2024/4:31:25 PM    Final      MDS (myelodysplastic syndrome) (HCC) #Low-grade myelodysplastic syndrome-refractory anemia with ringed sideroblasts.POSITIVE for SF3B1.  Patient currently on epo agents [since April 2021]; hemoglobin between 8-9.     # Proceed with  Luspatercept #3 today- Labs-CBC/chemistries were reviewed with the patient. ok with Luspatercept today HOLD aranesp today- stable  # HTN- at home in 140s; BP- 160s- monitor closely- stable  # Hx Of A.fib [OFF eliquis secondary to intracranial bleed]- s/p watchman device- on asprin ONLY- stable  # Hx of gout- recent falre- improved- continue allopurinol  # Stage kidney disease-stage III [dr.Korrapati]- continue compression-  stable.   # Cirrhosis and mild splenomegaly- Elevated bilirubin- [JAN 2024] Enzyme deficient- triggered by stress/UTI.  If recurrent/worse would recommend GI evaluation [Dr.Vanga] -  No suspicious liver lesions. Moderate volume ascites- reminded to take lasix as prescribed.   # DISPOSITION: wed preference # ok with  Luspatercept today HOLD aranesp today;   # in 3 weeks- MD: labs- cbc/cmp; ;  Luspatercept; possible Aranesp;  #  in 6 weeks- MD: labs- cbc/cmp; ;  Luspatercept; possible Aranesp; - Dr.B    All questions were  answered. The patient knows to call the clinic with any problems, questions or concerns.    Earna Coder, MD 03/04/2024 4:44 PM

## 2024-03-04 NOTE — Assessment & Plan Note (Addendum)
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  Patient currently on epo agents [since April 2021]; hemoglobin between 8-9.     # Proceed with  Luspatercept #3 today- Labs-CBC/chemistries were reviewed with the patient. ok with Luspatercept today HOLD aranesp today- stable  # HTN- at home in 140s; BP- 160s- monitor closely- stable  # Hx Of A.fib [OFF eliquis secondary to intracranial bleed]- s/p watchman device- on asprin ONLY- stable  # Hx of gout- recent falre- improved- continue allopurinol  # Stage kidney disease-stage III [dr.Korrapati]- continue compression-  stable.   # Cirrhosis and mild splenomegaly- Elevated bilirubin- [JAN 2024] Enzyme deficient- triggered by stress/UTI.  If recurrent/worse would recommend GI evaluation [Dr.Vanga] -  No suspicious liver lesions. Moderate volume ascites- reminded to take lasix as prescribed.   # DISPOSITION: wed preference # ok with Luspatercept today HOLD aranesp today;   # in 3 weeks- MD: labs- cbc/cmp; ;  Luspatercept; possible Aranesp;  #  in 6 weeks- MD: labs- cbc/cmp; ;  Luspatercept; possible Aranesp; - Dr.B

## 2024-03-04 NOTE — Progress Notes (Signed)
 Patient denies new or acute problems/concerns today.

## 2024-03-25 ENCOUNTER — Inpatient Hospital Stay (HOSPITAL_BASED_OUTPATIENT_CLINIC_OR_DEPARTMENT_OTHER): Admitting: Internal Medicine

## 2024-03-25 ENCOUNTER — Encounter: Payer: Self-pay | Admitting: Internal Medicine

## 2024-03-25 ENCOUNTER — Inpatient Hospital Stay

## 2024-03-25 ENCOUNTER — Ambulatory Visit

## 2024-03-25 ENCOUNTER — Other Ambulatory Visit

## 2024-03-25 ENCOUNTER — Ambulatory Visit: Admitting: Internal Medicine

## 2024-03-25 VITALS — BP 152/80 | HR 75 | Temp 98.3°F | Ht 69.0 in | Wt 184.0 lb

## 2024-03-25 DIAGNOSIS — D461 Refractory anemia with ring sideroblasts: Secondary | ICD-10-CM | POA: Diagnosis not present

## 2024-03-25 DIAGNOSIS — D469 Myelodysplastic syndrome, unspecified: Secondary | ICD-10-CM

## 2024-03-25 LAB — CMP (CANCER CENTER ONLY)
ALT: 15 U/L (ref 0–44)
AST: 19 U/L (ref 15–41)
Albumin: 4.1 g/dL (ref 3.5–5.0)
Alkaline Phosphatase: 112 U/L (ref 38–126)
Anion gap: 11 (ref 5–15)
BUN: 44 mg/dL — ABNORMAL HIGH (ref 8–23)
CO2: 23 mmol/L (ref 22–32)
Calcium: 9.1 mg/dL (ref 8.9–10.3)
Chloride: 104 mmol/L (ref 98–111)
Creatinine: 1.84 mg/dL — ABNORMAL HIGH (ref 0.61–1.24)
GFR, Estimated: 37 mL/min — ABNORMAL LOW (ref >60)
Glucose, Bld: 109 mg/dL — ABNORMAL HIGH (ref 70–99)
Potassium: 4.5 mmol/L (ref 3.5–5.1)
Sodium: 138 mmol/L (ref 135–145)
Total Bilirubin: 2.6 mg/dL — ABNORMAL HIGH (ref 0.0–1.2)
Total Protein: 7.4 g/dL (ref 6.5–8.1)

## 2024-03-25 LAB — CBC WITH DIFFERENTIAL (CANCER CENTER ONLY)
Abs Immature Granulocytes: 0.08 10*3/uL — ABNORMAL HIGH (ref 0.00–0.07)
Basophils Absolute: 0 10*3/uL (ref 0.0–0.1)
Basophils Relative: 0 %
Eosinophils Absolute: 0.3 10*3/uL (ref 0.0–0.5)
Eosinophils Relative: 3 %
HCT: 29.8 % — ABNORMAL LOW (ref 39.0–52.0)
Hemoglobin: 9.8 g/dL — ABNORMAL LOW (ref 13.0–17.0)
Immature Granulocytes: 1 %
Lymphocytes Relative: 23 %
Lymphs Abs: 1.8 10*3/uL (ref 0.7–4.0)
MCH: 31 pg (ref 26.0–34.0)
MCHC: 32.9 g/dL (ref 30.0–36.0)
MCV: 94.3 fL (ref 80.0–100.0)
Monocytes Absolute: 0.5 10*3/uL (ref 0.1–1.0)
Monocytes Relative: 7 %
Neutro Abs: 5 10*3/uL (ref 1.7–7.7)
Neutrophils Relative %: 66 %
Platelet Count: 256 10*3/uL (ref 150–400)
RBC: 3.16 MIL/uL — ABNORMAL LOW (ref 4.22–5.81)
RDW: 29.6 % — ABNORMAL HIGH (ref 11.5–15.5)
WBC Count: 7.6 10*3/uL (ref 4.0–10.5)
nRBC: 0.9 % — ABNORMAL HIGH (ref 0.0–0.2)

## 2024-03-25 MED ORDER — DARBEPOETIN ALFA 500 MCG/ML IJ SOSY: 500.0000 ug | PREFILLED_SYRINGE | Freq: Once | INTRAMUSCULAR | Status: DC

## 2024-03-25 MED ORDER — LUSPATERCEPT-AAMT 75 MG ~~LOC~~ SOLR
1.0000 mg/kg | Freq: Once | SUBCUTANEOUS | Status: AC
  Administered 2024-03-25: 90 mg via SUBCUTANEOUS
  Filled 2024-03-25: qty 1.5

## 2024-03-25 NOTE — Patient Instructions (Signed)
 Worthy Heads, MD  [address]  [place], [place] [address]  [phone] (Work)

## 2024-03-25 NOTE — Assessment & Plan Note (Addendum)
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  Patient currently on epo agents [since April 2021]; hemoglobin between 8-9.     # Proceed with  Luspatercept  #4 today- Labs-CBC/chemistries were reviewed with the patient. ok with Luspatercept  today HOLD aranesp  today- stable  # HTN- at home in 140s; BP- 150s- monitor closely- stable  # Hx Of A.fib [OFF eliquis  secondary to intracranial bleed]- s/p watchman device- on asprin ONLY- stable  # Hx of gout- recent falre- improved- continue allopurinol;   defer to [place]- PCP- Dr.Johnston in July 2025.    # Stage kidney disease-stage III-IV [dr.Korrapati]- continue compression-? Gout    # Cirrhosis and mild splenomegaly- Elevated bilirubin- [JAN 2024] Enzyme deficient- triggered by stress/UTI.  If recurrent/worse would recommend GI evaluation [Dr.Vanga] -  No suspicious liver lesions. Moderate volume ascites- reminded to take lasix - 20 mg Monday/Thursday-   PS-  # DISPOSITION: wed preference # ok with Luspatercept  today HOLD aranesp  today;   # in 3 weeks- MD: labs- cbc/cmp; ;  Luspatercept ;   #  in 6 weeks- MD: labs- cbc/cmp; ;  Luspatercept ; - Dr.B

## 2024-03-25 NOTE — Progress Notes (Signed)
 [facility] CONSULT NOTE  Patient Care Team: Little Riff, MD as PCP - General (Internal Medicine) Boyce Byes, MD as PCP - Electrophysiology (Cardiology) O'Neal, Cathay Clonts, MD as PCP - Cardiology (Cardiology) Gwyn Leos, MD as Consulting Physician (Hematology and Oncology)  CHIEF COMPLAINTS/PURPOSE OF CONSULTATION: MDS  Oncology History Overview Note  # EGD-none; colonoscopy-never [Cologurad x2- NEG];FEB 2021- US - abdomen-no liver disease/ Mild splenic enlargement [460cc; kidney cysts]; B12 folic acid /LDH haptoglobin normal.  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   # Hx Of A.fib [OFF eliquis  secondary to intracranial bleed]- s/p watchman device- on asprin ONLY- stable   #  small side branch IPMNs. Given small size, initially established imaging stabilityFEB 2-25-fluid signal cystic lesion in the inferior pancreatic head measuring 0.6 cm and in the dorsal pancreatic neck measuring 1.4 x 0.7 cm, unchanged. No solid component or suspicious contrast enhancement. No pancreatic ductal dilatation or surrounding inflammatory changes. These are most consistent with, and patient age,  or characterization is required. No surveillance needed. 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  With wife.   Vernon [name] 78 y.o.  male with history of recently diagnosed low-grade MDS; A-fib status post Watchman device; off Eliquis  [history of hemorrhagic stroke] currently on Luspatercept -  Patient c/o of pain in toes, concerned it's either gout or arthritis. No pain today. Comes and goes. Using Voltaren gel. Allopurinol was stopped recently by [place], they will not refill.    Patient noted to have weight loss;/ improvement of abdominal distention/  leg swelling.  Denies any worsening shortness of breath or cough. Patient currently  taking his Lasix .   Patient otherwise doing well.  Denies any bleeding or bruising.  Patient has not had any further strokes.   Review of Systems  Constitutional:  Positive for malaise/fatigue. Negative for chills, diaphoresis, fever and weight loss.  HENT:  Negative for nosebleeds and sore throat.   Eyes:  Negative for double vision.  Respiratory:  Negative for cough, hemoptysis, sputum production, shortness of breath and wheezing.   Cardiovascular:  Positive for leg swelling. Negative for chest pain, palpitations and orthopnea.  Gastrointestinal:  Negative for abdominal pain, blood in stool, constipation, diarrhea, heartburn, melena, nausea and vomiting.  Genitourinary:  Negative for dysuria, frequency and urgency.  Musculoskeletal:  Negative for back pain and joint pain.  Skin: Negative.  Negative for itching and rash.  Neurological:  Negative for dizziness, tingling, focal weakness, weakness and headaches.  Endo/Heme/Allergies:  Does not bruise/bleed easily.  Psychiatric/Behavioral:  Negative for depression. The patient is not nervous/anxious and does not have insomnia.     MEDICAL HISTORY:  Past Medical History:  Diagnosis Date   Anemia    Aortic  stenosis    Arthritis    Complication of anesthesia     hard time getting bp up after knee replacement   Coronary artery disease    Diabetes mellitus without complication (HCC)    GERD (gastroesophageal reflux disease)    occ tums prn   History of hiatal hernia    Hypertension    MDS (myelodysplastic syndrome) (HCC)    Presence of Watchman left atrial appendage closure device 07/26/2022   27mm Watchman FLX with Dr. Marven Slimmer    SURGICAL HISTORY: Past Surgical History:  Procedure Laterality Date   CARPAL TUNNEL RELEASE  2012   CRANIOTOMY N/A 02/10/2022   Procedure: SUBOCCIPITAL CRANIECTOMY FOR EVACUATION OF CEREBELLAR HEMATOMA;  Surgeon: Elna Haggis, MD;  Location: [facility] OR;  Service: Neurosurgery;  Laterality: N/A;   JOINT REPLACEMENT Right 2010   LEFT ATRIAL APPENDAGE OCCLUSION N/A 07/26/2022   Procedure: LEFT ATRIAL APPENDAGE OCCLUSION;  Surgeon: Boyce Byes, MD;  Location: [facility] INVASIVE CV LAB;  Service: Cardiovascular;  Laterality: N/A;   RIGHT/LEFT HEART CATH AND CORONARY ANGIOGRAPHY N/A 04/10/2023   Procedure: RIGHT/LEFT HEART CATH AND CORONARY ANGIOGRAPHY;  Surgeon: Arleen Lacer, MD;  Location: [facility] INVASIVE CV LAB;  Service: Cardiovascular;  Laterality: N/A;   SHOULDER ARTHROSCOPY WITH ROTATOR CUFF REPAIR AND OPEN BICEPS TENODESIS Right 11/09/2019   Procedure: RIGHT SHOULDER ARTHROSCOPY WITH SUBSCAPULARIS REPAIR, SUBACROMIAL DECOMPRESSION,MINI OPEN ROTATOR CUFF REPAIR;  Surgeon: Lorri Rota, MD;  Location: [facility] ORS;  Service: Orthopedics;  Laterality: Right;   TEE WITHOUT CARDIOVERSION N/A 07/26/2022   Procedure: TRANSESOPHAGEAL ECHOCARDIOGRAM (TEE);  Surgeon: Boyce Byes, MD;  Location: [facility] INVASIVE CV LAB;  Service: Cardiovascular;  Laterality: N/A;    SOCIAL HISTORY: Social History   Socioeconomic History   Marital status: Married    Spouse name: Not on file   Number of children: 2   Years of education: Not on file   Highest education level: Not on file  Occupational History   Occupation: Retired - Academic librarian   Tobacco Use   Smoking status: Former    Current packs/day: 0.00    Average packs/day: 0.5 packs/day for 8.0 years (4.0 ttl pk-yrs)    Types: Cigarettes    Start date: 07/21/1970    Quit date: 07/21/1978    Years since quitting: 45.7   Smokeless tobacco: Never  Vaping Use   Vaping status: Never Used  Substance and Sexual Activity   Alcohol use: Yes    Alcohol/week: 0.0 - 1.0 standard drinks of alcohol    Comment: rare beer   Drug use: Never   Sexual activity: Not on file  Other Topics Concern   Not on file  Social History Narrative   Lives in [place]; with wife; quit smoking in early 103s; ocassional/ rare [may be 1 a month beer]. 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    Social Drivers of Corporate investment banker Strain: Not on file  Food Insecurity: No Food Insecurity (12/18/2022)   Hunger Vital Sign    Worried About Running Out of Food in the Last Year: Never true    Ran Out of Food in the Last Year: Never true  Transportation Needs: No Transportation Needs (12/18/2022)   PRAPARE - Administrator, Civil Service (Medical): No    Lack of Transportation (Non-Medical): No  Physical Activity: Not on file  Stress: Not on file  Social Connections: Not on file  Intimate Partner Violence: Not At Risk (12/18/2022)   Humiliation, Afraid, Rape, and  Kick questionnaire    Fear of Current or Ex-Partner: No    Emotionally Abused: No    Physically Abused: No    Sexually Abused: No    FAMILY HISTORY: Family History  Problem Relation Age of Onset   Cancer Maternal Uncle     ALLERGIES:  has no known allergies.  MEDICATIONS:  Current Outpatient Medications  Medication Sig Dispense Refill   aspirin  EC 81 MG tablet Take 1 tablet (81 mg total) by mouth daily. Swallow whole. 90 tablet 3   ezetimibe  (ZETIA ) 10 MG tablet Take 1 tablet (10 mg total) by mouth daily. (Patient taking differently: Take 10 mg by mouth daily. 0.5 tab Mon and Th am) 90 tablet 3   furosemide   (LASIX ) 20 MG tablet TAKE 0.5 TABLET DAILY. MAY TAKE EXTRA TABLET IF NEEDED (Patient taking differently: Take 10 mg by mouth 2 (two) times a week. TAKE 0.5 TABLET DAILY. MAY TAKE EXTRA TABLET IF NEEDED) 90 tablet 3   metFORMIN  (GLUCOPHAGE ) 500 MG tablet Take 500 mg by mouth 2 (two) times daily with a meal.     Multiple Vitamin (MULTI-VITAMIN) tablet Take 1 tablet by mouth daily. (Patient not taking: Reported on 03/25/2024)     No current facility-administered medications for this visit.      PHYSICAL EXAMINATION:   Vitals:   03/25/24 1430  BP: (!) 152/80  Pulse: 75  Temp: 98.3 F (36.8 C)  SpO2: 97%    Filed Weights   03/25/24 1430  Weight: 184 lb (83.5 kg)    Positive for abdominal distention.  Positive for leg swelling bilaterally.  Pitting  edema Physical Exam HENT:     Head: Normocephalic and atraumatic.     Mouth/Throat:     Pharynx: No oropharyngeal exudate.  Eyes:     Pupils: Pupils are equal, round, and reactive to light.  Cardiovascular:     Rate and Rhythm: Normal rate and regular rhythm.     Heart sounds: Murmur heard.  Pulmonary:     Effort: Pulmonary effort is normal. No respiratory distress.     Breath sounds: Normal breath sounds. No wheezing.  Abdominal:     General: Bowel sounds are normal. There is no distension.     Palpations: Abdomen is soft. There is no mass.     Tenderness: There is no abdominal tenderness. There is no guarding or rebound.  Musculoskeletal:        General: No tenderness. Normal range of motion.     Cervical back: Normal range of motion and neck supple.  Skin:    General: Skin is warm.  Neurological:     Mental Status: He is alert and oriented to person, place, and time.  Psychiatric:        Mood and Affect: Affect normal.    LABORATORY DATA:  I have reviewed the data as listed Lab Results  Component Value Date   WBC 7.6 03/25/2024   HGB 9.8 (L) 03/25/2024   HCT 29.8 (L) 03/25/2024   MCV 94.3 03/25/2024   PLT 256  03/25/2024   Recent Labs    02/12/24 1433 03/04/24 1445 03/25/24 1433  NA 135 136 138  K 4.0 4.2 4.5  CL 101 103 104  CO2 25 24 23   GLUCOSE 104* 105* 109*  BUN 33* 39* 44*  CREATININE 1.33* 1.38* 1.84*  CALCIUM  9.1 9.1 9.1  GFRNONAA 55* 53* 37*  PROT 7.7 7.2 7.4  ALBUMIN  4.4 4.3 4.1  AST 20 21 19   ALT  12 17 15   ALKPHOS 99 110 112  BILITOT 2.8* 2.9* 2.6*     No results found.    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  Patient currently on epo agents [since April 2021]; hemoglobin between 8-9.     # Proceed with  Luspatercept  #4 today- Labs-CBC/chemistries were reviewed with the patient. ok with Luspatercept  today HOLD aranesp  today- stable  # HTN- at home in 140s; BP- 150s- monitor closely- stable  # Hx Of A.fib [OFF eliquis  secondary to intracranial bleed]- s/p watchman device- on asprin ONLY- stable  # Hx of gout- recent falre- improved- continue allopurinol;   defer to [place]- PCP- Dr.Johnston in July 2025.    # Stage kidney disease-stage III-IV [dr.Korrapati]- continue compression-? Gout    # Cirrhosis and mild splenomegaly- Elevated bilirubin- [JAN 2024] Enzyme deficient- triggered by stress/UTI.  If recurrent/worse would recommend GI evaluation [Dr.Vanga] -  No suspicious liver lesions. Moderate volume ascites- reminded to take lasix - 20 mg Monday/Thursday-   PS-  # DISPOSITION: wed preference # ok with Luspatercept  today HOLD aranesp  today;   # in 3 weeks- MD: labs- cbc/cmp; ;  Luspatercept ;   #  in 6 weeks- MD: labs- cbc/cmp; ;  Luspatercept ; - Dr.B    All questions were answered. The patient knows to call the clinic with any problems, questions or concerns.    Gwyn Leos, MD 03/27/2024 11:44 AM

## 2024-03-25 NOTE — Progress Notes (Signed)
 C/o pain in toes, concerned it's either gout or arthritis.No pain today. Comes and goes. Using Voltaren gel. Allopurinol was stopped recently by [place], they will not refill. Can you prescribe this?

## 2024-03-26 ENCOUNTER — Other Ambulatory Visit: Payer: Self-pay | Admitting: Internal Medicine

## 2024-03-26 DIAGNOSIS — D469 Myelodysplastic syndrome, unspecified: Secondary | ICD-10-CM

## 2024-03-27 ENCOUNTER — Encounter: Payer: Self-pay | Admitting: Internal Medicine

## 2024-04-15 ENCOUNTER — Inpatient Hospital Stay

## 2024-04-15 ENCOUNTER — Encounter: Payer: Self-pay | Admitting: Internal Medicine

## 2024-04-15 ENCOUNTER — Ambulatory Visit

## 2024-04-15 ENCOUNTER — Inpatient Hospital Stay: Attending: Internal Medicine

## 2024-04-15 ENCOUNTER — Ambulatory Visit: Admitting: Internal Medicine

## 2024-04-15 ENCOUNTER — Inpatient Hospital Stay (HOSPITAL_BASED_OUTPATIENT_CLINIC_OR_DEPARTMENT_OTHER): Admitting: Internal Medicine

## 2024-04-15 ENCOUNTER — Other Ambulatory Visit

## 2024-04-15 VITALS — BP 140/88 | HR 65 | Temp 97.3°F | Resp 18 | Ht 69.0 in | Wt 189.6 lb

## 2024-04-15 DIAGNOSIS — D469 Myelodysplastic syndrome, unspecified: Secondary | ICD-10-CM

## 2024-04-15 DIAGNOSIS — D461 Refractory anemia with ring sideroblasts: Secondary | ICD-10-CM | POA: Diagnosis present

## 2024-04-15 LAB — CMP (CANCER CENTER ONLY)
ALT: 21 U/L (ref 0–44)
AST: 26 U/L (ref 15–41)
Albumin: 3.8 g/dL (ref 3.5–5.0)
Alkaline Phosphatase: 116 U/L (ref 38–126)
Anion gap: 8 (ref 5–15)
BUN: 33 mg/dL — ABNORMAL HIGH (ref 8–23)
CO2: 24 mmol/L (ref 22–32)
Calcium: 8.6 mg/dL — ABNORMAL LOW (ref 8.9–10.3)
Chloride: 105 mmol/L (ref 98–111)
Creatinine: 1.5 mg/dL — ABNORMAL HIGH (ref 0.61–1.24)
GFR, Estimated: 48 mL/min — ABNORMAL LOW (ref >60)
Glucose, Bld: 187 mg/dL — ABNORMAL HIGH (ref 70–99)
Potassium: 4.3 mmol/L (ref 3.5–5.1)
Sodium: 137 mmol/L (ref 135–145)
Total Bilirubin: 2.2 mg/dL — ABNORMAL HIGH (ref 0.0–1.2)
Total Protein: 7.4 g/dL (ref 6.5–8.1)

## 2024-04-15 LAB — CBC WITH DIFFERENTIAL (CANCER CENTER ONLY)
Abs Immature Granulocytes: 0.06 10*3/uL (ref 0.00–0.07)
Basophils Absolute: 0.1 10*3/uL (ref 0.0–0.1)
Basophils Relative: 1 %
Eosinophils Absolute: 0.2 10*3/uL (ref 0.0–0.5)
Eosinophils Relative: 4 %
HCT: 28.5 % — ABNORMAL LOW (ref 39.0–52.0)
Hemoglobin: 9.5 g/dL — ABNORMAL LOW (ref 13.0–17.0)
Immature Granulocytes: 1 %
Lymphocytes Relative: 21 %
Lymphs Abs: 1.3 10*3/uL (ref 0.7–4.0)
MCH: 31.8 pg (ref 26.0–34.0)
MCHC: 33.3 g/dL (ref 30.0–36.0)
MCV: 95.3 fL (ref 80.0–100.0)
Monocytes Absolute: 0.6 10*3/uL (ref 0.1–1.0)
Monocytes Relative: 9 %
Neutro Abs: 4.2 10*3/uL (ref 1.7–7.7)
Neutrophils Relative %: 64 %
Platelet Count: 277 10*3/uL (ref 150–400)
RBC: 2.99 MIL/uL — ABNORMAL LOW (ref 4.22–5.81)
RDW: 28 % — ABNORMAL HIGH (ref 11.5–15.5)
WBC Count: 6.5 10*3/uL (ref 4.0–10.5)
nRBC: 1.6 % — ABNORMAL HIGH (ref 0.0–0.2)

## 2024-04-15 MED ORDER — LUSPATERCEPT-AAMT 75 MG ~~LOC~~ SOLR
1.0000 mg/kg | Freq: Once | SUBCUTANEOUS | Status: AC
Start: 2024-04-15 — End: 2024-04-15
  Administered 2024-04-15: 90 mg via SUBCUTANEOUS
  Filled 2024-04-15: qty 1.5

## 2024-04-15 NOTE — Progress Notes (Signed)
 [facility] CONSULT NOTE  Patient Care Team: Little Riff, MD as PCP - General (Internal Medicine) Boyce Byes, MD as PCP - Electrophysiology (Cardiology) O'Neal, Cathay Clonts, MD as PCP - Cardiology (Cardiology) Gwyn Leos, MD as Consulting Physician (Hematology and Oncology)  CHIEF COMPLAINTS/PURPOSE OF CONSULTATION: MDS  Oncology History Overview Note  # EGD-none; colonoscopy-never [Cologurad x2- NEG];FEB 2021- US - abdomen-no liver disease/ Mild splenic enlargement [460cc; kidney cysts]; B12 folic acid /LDH haptoglobin normal.  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   # Hx Of A.fib [OFF eliquis  secondary to intracranial bleed]- s/p watchman device- on asprin ONLY- stable   #  small side branch IPMNs. Given small size, initially established imaging stabilityFEB 2-25-fluid signal cystic lesion in the inferior pancreatic head measuring 0.6 cm and in the dorsal pancreatic neck measuring 1.4 x 0.7 cm, unchanged. No solid component or suspicious contrast enhancement. No pancreatic ductal dilatation or surrounding inflammatory changes. These are most consistent with, and patient age,  or characterization is required. No surveillance needed. 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  With wife.   Vernon [name] 78 y.o.  male with history of recently diagnosed low-grade MDS; A-fib status post Watchman device; off Eliquis  [history of hemorrhagic stroke] currently on Luspatercept -  Patient states that he recently tripped and fell off his tractor-had abrasions of his left arm.  Had bleeding currently stopped.  He did not hit his head.  Patient states to be compliant with his Lasix . Patient otherwise doing well.  Denies any bleeding or bruising.  Patient has not had any further strokes.   Review of Systems  Constitutional:  Positive for malaise/fatigue. Negative for chills, diaphoresis, fever and weight loss.  HENT:  Negative for nosebleeds and sore throat.   Eyes:  Negative for double vision.  Respiratory:  Negative for cough, hemoptysis, sputum production, shortness of breath and wheezing.   Cardiovascular:  Positive for leg swelling. Negative for chest pain, palpitations and orthopnea.  Gastrointestinal:  Negative for abdominal pain, blood in stool, constipation, diarrhea, heartburn, melena, nausea and vomiting.  Genitourinary:  Negative for dysuria, frequency and urgency.  Musculoskeletal:  Negative for back pain and joint pain.  Skin: Negative.  Negative for itching and rash.  Neurological:  Negative for dizziness, tingling, focal weakness, weakness and headaches.  Endo/Heme/Allergies:  Does not bruise/bleed easily.  Psychiatric/Behavioral:  Negative for depression. The patient is not nervous/anxious and does not have insomnia.     MEDICAL HISTORY:  Past Medical History:  Diagnosis Date   Anemia    Aortic stenosis    Arthritis    Complication of anesthesia    hard time getting bp up after knee replacement   Coronary artery disease  Diabetes mellitus without complication (HCC)    GERD (gastroesophageal reflux  disease)    occ tums prn   History of hiatal hernia    Hypertension    MDS (myelodysplastic syndrome) (HCC)    Presence of Watchman left atrial appendage closure device 07/26/2022   27mm Watchman FLX with Dr. Marven Slimmer    SURGICAL HISTORY: Past Surgical History:  Procedure Laterality Date   CARPAL TUNNEL RELEASE  2012   CRANIOTOMY N/A 02/10/2022   Procedure: SUBOCCIPITAL CRANIECTOMY FOR EVACUATION OF CEREBELLAR HEMATOMA;  Surgeon: Elna Haggis, MD;  Location: [facility] OR;  Service: Neurosurgery;  Laterality: N/A;   JOINT REPLACEMENT Right 2010   LEFT ATRIAL APPENDAGE OCCLUSION N/A 07/26/2022   Procedure: LEFT ATRIAL APPENDAGE OCCLUSION;  Surgeon: Boyce Byes, MD;  Location: [facility] INVASIVE CV LAB;  Service: Cardiovascular;  Laterality: N/A;   RIGHT/LEFT HEART CATH AND CORONARY ANGIOGRAPHY N/A 04/10/2023   Procedure: RIGHT/LEFT HEART CATH AND CORONARY ANGIOGRAPHY;  Surgeon: Arleen Lacer, MD;  Location: [facility] INVASIVE CV LAB;  Service: Cardiovascular;  Laterality: N/A;   SHOULDER ARTHROSCOPY WITH ROTATOR CUFF REPAIR AND OPEN BICEPS TENODESIS Right 11/09/2019   Procedure: RIGHT SHOULDER ARTHROSCOPY WITH SUBSCAPULARIS REPAIR, SUBACROMIAL DECOMPRESSION,MINI OPEN ROTATOR CUFF REPAIR;  Surgeon: Lorri Rota, MD;  Location: [facility] ORS;  Service: Orthopedics;  Laterality: Right;   TEE WITHOUT CARDIOVERSION N/A 07/26/2022   Procedure: TRANSESOPHAGEAL ECHOCARDIOGRAM (TEE);  Surgeon: Boyce Byes, MD;  Location: [facility] INVASIVE CV LAB;  Service: Cardiovascular;  Laterality: N/A;    SOCIAL HISTORY: Social History   Socioeconomic History   Marital status: Married    Spouse name: Not on file   Number of children: 2   Years of education: Not on file   Highest education level: Not on file  Occupational History   Occupation: Retired - Academic librarian  Tobacco Use   Smoking status: Former    Current packs/day: 0.00    Average packs/day: 0.5 packs/day for 8.0 years (4.0 ttl pk-yrs)    Types: Cigarettes     Start date: 07/21/1970    Quit date: 07/21/1978    Years since quitting: 45.7   Smokeless tobacco: Never  Vaping Use   Vaping status: Never Used  Substance and Sexual Activity   Alcohol use: Yes    Alcohol/week: 0.0 - 1.0 standard drinks of alcohol    Comment: rare beer   Drug use: Never   Sexual activity: Not on file  Other Topics Concern   Not on file  Social History Narrative   Lives in [place]; with wife; quit smoking in early 22s; ocassional/ rare [may be 1 a month beer]. 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    Social Drivers of Corporate investment banker Strain: Not on file  Food Insecurity: No Food Insecurity (12/18/2022)   Hunger Vital Sign    Worried About Running Out of Food in the Last Year: Never true    Ran Out of Food in the Last Year: Never true  Transportation Needs: No Transportation Needs (12/18/2022)   PRAPARE - Administrator, Civil Service (Medical): No    Lack of Transportation (Non-Medical): No  Physical Activity: Not on file  Stress: Not on file  Social Connections: Not on file  Intimate Partner Violence: Not At Risk (12/18/2022)   Humiliation, Afraid, Rape, and Kick questionnaire    Fear of Current or Ex-Partner: No    Emotionally Abused: No    Physically Abused: No    Sexually Abused: No  FAMILY HISTORY: Family History  Problem Relation Age of Onset   Cancer Maternal Uncle     ALLERGIES:  has no known allergies.  MEDICATIONS:  Current Outpatient Medications  Medication Sig Dispense Refill   aspirin  EC 81 MG tablet Take 1 tablet (81 mg total) by mouth daily. Swallow whole. 90 tablet 3   ezetimibe  (ZETIA ) 10 MG tablet Take 1 tablet (10 mg total) by mouth daily. (Patient taking differently: Take 10 mg by mouth daily. 0.5 tab Mon and Th am) 90 tablet 3   furosemide  (LASIX ) 20 MG tablet TAKE 0.5 TABLET DAILY. MAY TAKE EXTRA TABLET IF NEEDED (Patient taking differently: Take 10 mg by mouth 2 (two) times a week. TAKE 0.5 TABLET  DAILY. MAY TAKE EXTRA TABLET IF NEEDED) 90 tablet 3   metFORMIN  (GLUCOPHAGE ) 500 MG tablet Take 500 mg by mouth 2 (two) times daily with a meal.     No current facility-administered medications for this visit.      PHYSICAL EXAMINATION:   Vitals:   04/15/24 1434 04/15/24 1454  BP: (!) 154/80 (!) 140/88  Pulse: 65   Resp: 18   Temp: (!) 97.3 F (36.3 C)   SpO2: 97%     Filed Weights   04/15/24 1434  Weight: 189 lb 9.6 oz (86 kg)    Positive for abdominal distention.  Positive for leg swelling bilaterally.  Pitting  edema Physical Exam HENT:     Head: Normocephalic and atraumatic.     Mouth/Throat:     Pharynx: No oropharyngeal exudate.  Eyes:     Pupils: Pupils are equal, round, and reactive to light.  Cardiovascular:     Rate and Rhythm: Normal rate and regular rhythm.     Heart sounds: Murmur heard.  Pulmonary:     Effort: Pulmonary effort is normal. No respiratory distress.     Breath sounds: Normal breath sounds. No wheezing.  Abdominal:     General: Bowel sounds are normal. There is no distension.     Palpations: Abdomen is soft. There is no mass.     Tenderness: There is no abdominal tenderness. There is no guarding or rebound.  Musculoskeletal:        General: No tenderness. Normal range of motion.     Cervical back: Normal range of motion and neck supple.  Skin:    General: Skin is warm.  Neurological:     Mental Status: He is alert and oriented to person, place, and time.  Psychiatric:        Mood and Affect: Affect normal.    LABORATORY DATA:  I have reviewed the data as listed Lab Results  Component Value Date   WBC 6.5 04/15/2024   HGB 9.5 (L) 04/15/2024   HCT 28.5 (L) 04/15/2024   MCV 95.3 04/15/2024   PLT 277 04/15/2024   Recent Labs    03/04/24 1445 03/25/24 1433 04/15/24 1433  NA 136 138 137  K 4.2 4.5 4.3  CL 103 104 105  CO2 24 23 24   GLUCOSE 105* 109* 187*  BUN 39* 44* 33*  CREATININE 1.38* 1.84* 1.50*  CALCIUM  9.1 9.1  8.6*  GFRNONAA 53* 37* 48*  PROT 7.2 7.4 7.4  ALBUMIN  4.3 4.1 3.8  AST 21 19 26   ALT 17 15 21   ALKPHOS 110 112 116  BILITOT 2.9* 2.6* 2.2*     No results found.    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  Patient  currently on epo agents [since April 2021]; hemoglobin between 8-9.     # Proceed with  Luspatercept  #5 today- Labs-CBC/chemistries were reviewed with the patient. ok with Luspatercept  today HOLD aranesp  today- stable  # HTN- at home in 140s; BP- 150s- monitor closely- stable  # Hx Of A.fib [OFF eliquis  secondary to intracranial bleed]- s/p watchman device- on asprin ONLY- stable  # Hx of gout- recent falre- improved- continue allopurinol;   defer to [place]- PCP- Dr.Johnston in July 2025.    # Stage kidney disease-stage III-IV [Dr.Korrapati]- on lasix  10 mg twice a week.   PS-  # DISPOSITION: wed preference # ok with Luspatercept  today  # in 3 weeks- MD: labs- cbc/cmp; Luspatercept ;   #  in 6 weeks- MD: labs- cbc/cmp; Luspatercept ; # in 9 weeks- MD: labs- cbc/cmp; Luspatercept ;  - Dr.B     All questions were answered. The patient knows to call the clinic with any problems, questions or concerns.    Gwyn Leos, MD 04/15/2024 3:38 PM

## 2024-04-15 NOTE — Progress Notes (Signed)
 Fell 2-3 days ago off a tractor, slipped, abrasions on left arm.  No concerns today. Has kidney doctor appt next Wed, is it still necessary?

## 2024-04-15 NOTE — Assessment & Plan Note (Addendum)
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  Patient currently on epo agents [since April 2021]; hemoglobin between 8-9.     # Proceed with  Luspatercept  #5 today- Labs-CBC/chemistries were reviewed with the patient. ok with Luspatercept  today HOLD aranesp  today- stable  # HTN- at home in 140s; BP- 150s- monitor closely- stable  # Hx Of A.fib [OFF eliquis  secondary to intracranial bleed]- s/p watchman device- on asprin ONLY- stable  # Hx of gout- recent falre- improved- continue allopurinol;   defer to [place]- PCP- Dr.Johnston in July 2025.    # Stage kidney disease-stage III-IV [Dr.Korrapati]- on lasix  10 mg twice a week.   PS-  # DISPOSITION: wed preference # ok with Luspatercept  today  # in 3 weeks- MD: labs- cbc/cmp; Luspatercept ;   #  in 6 weeks- MD: labs- cbc/cmp; Luspatercept ; # in 9 weeks- MD: labs- cbc/cmp; Luspatercept ;  - Dr.B

## 2024-05-06 ENCOUNTER — Encounter: Payer: Self-pay | Admitting: Internal Medicine

## 2024-05-06 ENCOUNTER — Inpatient Hospital Stay: Admitting: Internal Medicine

## 2024-05-06 ENCOUNTER — Inpatient Hospital Stay

## 2024-05-06 ENCOUNTER — Inpatient Hospital Stay: Attending: Internal Medicine

## 2024-05-06 ENCOUNTER — Ambulatory Visit: Payer: Medicare Other | Admitting: Neurology

## 2024-05-06 VITALS — BP 140/82 | HR 62 | Temp 97.8°F | Resp 18 | Ht 69.0 in | Wt 195.3 lb

## 2024-05-06 DIAGNOSIS — D461 Refractory anemia with ring sideroblasts: Secondary | ICD-10-CM | POA: Insufficient documentation

## 2024-05-06 DIAGNOSIS — D469 Myelodysplastic syndrome, unspecified: Secondary | ICD-10-CM

## 2024-05-06 DIAGNOSIS — D4621 Refractory anemia with excess of blasts 1: Secondary | ICD-10-CM | POA: Diagnosis not present

## 2024-05-06 LAB — CMP (CANCER CENTER ONLY)
ALT: 22 U/L (ref 0–44)
AST: 27 U/L (ref 15–41)
Albumin: 4.2 g/dL (ref 3.5–5.0)
Alkaline Phosphatase: 107 U/L (ref 38–126)
Anion gap: 8 (ref 5–15)
BUN: 40 mg/dL — ABNORMAL HIGH (ref 8–23)
CO2: 26 mmol/L (ref 22–32)
Calcium: 9 mg/dL (ref 8.9–10.3)
Chloride: 104 mmol/L (ref 98–111)
Creatinine: 1.58 mg/dL — ABNORMAL HIGH (ref 0.61–1.24)
GFR, Estimated: 45 mL/min — ABNORMAL LOW (ref >60)
Glucose, Bld: 137 mg/dL — ABNORMAL HIGH (ref 70–99)
Potassium: 4.7 mmol/L (ref 3.5–5.1)
Sodium: 138 mmol/L (ref 135–145)
Total Bilirubin: 2.6 mg/dL — ABNORMAL HIGH (ref 0.0–1.2)
Total Protein: 7.1 g/dL (ref 6.5–8.1)

## 2024-05-06 LAB — CBC WITH DIFFERENTIAL (CANCER CENTER ONLY)
Abs Immature Granulocytes: 0.11 10*3/uL — ABNORMAL HIGH (ref 0.00–0.07)
Basophils Absolute: 0.1 10*3/uL (ref 0.0–0.1)
Basophils Relative: 1 %
Eosinophils Absolute: 0.3 10*3/uL (ref 0.0–0.5)
Eosinophils Relative: 5 %
HCT: 30.2 % — ABNORMAL LOW (ref 39.0–52.0)
Hemoglobin: 10 g/dL — ABNORMAL LOW (ref 13.0–17.0)
Immature Granulocytes: 2 %
Lymphocytes Relative: 24 %
Lymphs Abs: 1.4 10*3/uL (ref 0.7–4.0)
MCH: 32.8 pg (ref 26.0–34.0)
MCHC: 33.1 g/dL (ref 30.0–36.0)
MCV: 99 fL (ref 80.0–100.0)
Monocytes Absolute: 0.5 10*3/uL (ref 0.1–1.0)
Monocytes Relative: 9 %
Neutro Abs: 3.6 10*3/uL (ref 1.7–7.7)
Neutrophils Relative %: 59 %
Platelet Count: 283 10*3/uL (ref 150–400)
RBC: 3.05 MIL/uL — ABNORMAL LOW (ref 4.22–5.81)
RDW: 27.6 % — ABNORMAL HIGH (ref 11.5–15.5)
WBC Count: 6 10*3/uL (ref 4.0–10.5)
nRBC: 1.8 % — ABNORMAL HIGH (ref 0.0–0.2)

## 2024-05-06 MED ORDER — LUSPATERCEPT-AAMT 75 MG ~~LOC~~ SOLR
1.0000 mg/kg | Freq: Once | SUBCUTANEOUS | Status: AC
  Administered 2024-05-06: 90 mg via SUBCUTANEOUS
  Filled 2024-05-06: qty 1.5

## 2024-05-06 NOTE — Assessment & Plan Note (Signed)
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  Patient currently on epo agents [since April 2021]; hemoglobin between 8-9.     # Proceed with  Luspatercept  #6 today- Labs-CBC/chemistries were reviewed with the patient. ok with Luspatercept  today HOLD aranesp  today- stable; given the overall improvement in his hematocrit and hemoglobin-recommended trial of every 4-week injections.   # HTN- at home in 140s; BP- 150s- monitor closely- stable  # Hx Of A.fib [OFF eliquis  secondary to intracranial bleed]- s/p watchman device- on asprin ONLY- stable  # Hx of gout- recent falre- improved- continue allopurinol;   defer to [place]- PCP- Dr.Johnston in July 2025.    # Stage kidney disease-stage III-IV [Dr.Korrapati]- on lasix  10 mg twice a week.   PS-  # DISPOSITION: wed preference # ok with Luspatercept  today  # in 4 weeks- MD: labs- cbc/cmp; Luspatercept ;   #  in 8 weeks- MD: labs- cbc/cmp; Luspatercept ; # in 12 weeks- MD: labs- cbc/cmp; Luspatercept ;  - Dr.B

## 2024-05-06 NOTE — Progress Notes (Signed)
 No concerns today

## 2024-05-06 NOTE — Progress Notes (Signed)
 I connected with Vernon [name] on 05/06/24 at  9:30 AM EDT by video enabled telemedicine visit and verified that I am speaking with the correct person using two identifiers.  I discussed the limitations, risks, security and privacy concerns of performing an evaluation and management service by telemedicine and the availability of in-person appointments. I also discussed with the patient that there may be a patient responsible charge related to this service. The patient expressed understanding and agreed to proceed.    Other persons participating in the visit and their role in the encounter: RN/medical reconciliation Patient's location: office Provider's location: home  Oncology History Overview Note  # EGD-none; colonoscopy-never [Cologurad x2- NEG];FEB 2021- US - abdomen-no liver disease/ Mild splenic enlargement [460cc; kidney cysts]; B12 folic acid /LDH haptoglobin normal.  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   # Hx Of A.fib [OFF eliquis  secondary to intracranial bleed]- s/p watchman device- on asprin ONLY- stable   #  small side branch IPMNs. Given small size, initially established imaging stabilityFEB 2-25-fluid signal cystic lesion in the inferior pancreatic head measuring 0.6 cm and in the dorsal pancreatic neck measuring 1.4 x 0.7 cm, unchanged. No solid component or suspicious contrast enhancement. No pancreatic ductal dilatation or surrounding inflammatory changes. These are most consistent with, and patient age,  or characterization is required. No surveillance needed. 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  male with history of low-grade MDS-SF 3B mutation on luspatercept  is here for follow-up.  Patient denies any worsening shortness of breath or cough.  Patient has not needed any blood transfusions.  No falls.  Denies any bleeding.  Denies any swelling of the legs or abdominal distention.  Observation/objective: Alert & oriented x 3. In No acute distress.   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  Patient currently on epo agents [since April 2021]; hemoglobin between 8-9.     # Proceed with  Luspatercept  #6 today- Labs-CBC/chemistries were reviewed with the patient. ok with Luspatercept  today HOLD aranesp  today- stable; given the overall improvement in his hematocrit and hemoglobin-recommended trial of every 4-week injections.   # HTN- at home in 140s; BP- 150s- monitor closely- stable  # Hx Of A.fib [OFF eliquis  secondary to intracranial bleed]- s/p watchman device- on asprin ONLY- stable  # Hx of gout- recent falre- improved- continue allopurinol;   defer to [place]- PCP- Dr.Johnston in July 2025.    # Stage kidney disease-stage III-IV [Dr.Korrapati]- on lasix  10 mg twice a week.   PS-  # DISPOSITION: wed preference # ok with Luspatercept  today  # in 4 weeks- MD: labs- cbc/cmp; Luspatercept ;   #  in  8 weeks- MD: labs- cbc/cmp; Luspatercept ; # in 12 weeks- MD: labs- cbc/cmp; Luspatercept ;  -  Dr.B  Follow-up instructions:  I discussed the assessment and treatment plan with the patient.  The patient was provided an opportunity to ask questions and all were answered.  The patient agreed with the plan and demonstrated understanding of instructions.  The patient was advised to call back or seek an in person evaluation if the symptoms worsen or if the condition fails to improve as anticipated.  Dr. Cherilynn Schomburg [facility] 05/06/2024 9:50 AM

## 2024-05-13 ENCOUNTER — Ambulatory Visit: Payer: Medicare Other | Admitting: Neurology

## 2024-05-20 ENCOUNTER — Other Ambulatory Visit: Payer: Self-pay

## 2024-05-22 ENCOUNTER — Other Ambulatory Visit: Payer: Self-pay

## 2024-05-24 ENCOUNTER — Other Ambulatory Visit: Payer: Self-pay

## 2024-05-27 ENCOUNTER — Other Ambulatory Visit

## 2024-05-27 ENCOUNTER — Ambulatory Visit: Admitting: Internal Medicine

## 2024-05-27 ENCOUNTER — Ambulatory Visit

## 2024-06-02 ENCOUNTER — Other Ambulatory Visit: Payer: Self-pay

## 2024-06-03 ENCOUNTER — Encounter: Payer: Self-pay | Admitting: Internal Medicine

## 2024-06-03 ENCOUNTER — Encounter: Payer: Self-pay | Admitting: Neurology

## 2024-06-03 ENCOUNTER — Other Ambulatory Visit

## 2024-06-03 ENCOUNTER — Inpatient Hospital Stay: Admitting: Internal Medicine

## 2024-06-03 ENCOUNTER — Inpatient Hospital Stay: Attending: Internal Medicine

## 2024-06-03 ENCOUNTER — Ambulatory Visit: Admitting: Neurology

## 2024-06-03 ENCOUNTER — Inpatient Hospital Stay

## 2024-06-03 VITALS — BP 147/82 | HR 58 | Ht 69.0 in | Wt 190.0 lb

## 2024-06-03 VITALS — BP 140/70 | HR 65 | Temp 97.0°F | Resp 18 | Ht 69.0 in | Wt 190.0 lb

## 2024-06-03 DIAGNOSIS — G3184 Mild cognitive impairment, so stated: Secondary | ICD-10-CM

## 2024-06-03 DIAGNOSIS — G119 Hereditary ataxia, unspecified: Secondary | ICD-10-CM

## 2024-06-03 DIAGNOSIS — E7849 Other hyperlipidemia: Secondary | ICD-10-CM | POA: Diagnosis not present

## 2024-06-03 DIAGNOSIS — D469 Myelodysplastic syndrome, unspecified: Secondary | ICD-10-CM

## 2024-06-03 DIAGNOSIS — D461 Refractory anemia with ring sideroblasts: Secondary | ICD-10-CM | POA: Insufficient documentation

## 2024-06-03 DIAGNOSIS — I619 Nontraumatic intracerebral hemorrhage, unspecified: Secondary | ICD-10-CM | POA: Diagnosis not present

## 2024-06-03 LAB — CBC WITH DIFFERENTIAL (CANCER CENTER ONLY)
Abs Immature Granulocytes: 0.04 K/uL (ref 0.00–0.07)
Basophils Absolute: 0 K/uL (ref 0.0–0.1)
Basophils Relative: 1 %
Eosinophils Absolute: 0.2 K/uL (ref 0.0–0.5)
Eosinophils Relative: 4 %
HCT: 29.7 % — ABNORMAL LOW (ref 39.0–52.0)
Hemoglobin: 10.1 g/dL — ABNORMAL LOW (ref 13.0–17.0)
Immature Granulocytes: 1 %
Lymphocytes Relative: 22 %
Lymphs Abs: 1.3 K/uL (ref 0.7–4.0)
MCH: 33.9 pg (ref 26.0–34.0)
MCHC: 34 g/dL (ref 30.0–36.0)
MCV: 99.7 fL (ref 80.0–100.0)
Monocytes Absolute: 0.4 K/uL (ref 0.1–1.0)
Monocytes Relative: 7 %
Neutro Abs: 3.9 K/uL (ref 1.7–7.7)
Neutrophils Relative %: 65 %
Platelet Count: 268 K/uL (ref 150–400)
RBC: 2.98 MIL/uL — ABNORMAL LOW (ref 4.22–5.81)
RDW: 25.8 % — ABNORMAL HIGH (ref 11.5–15.5)
WBC Count: 6 K/uL (ref 4.0–10.5)
nRBC: 0.8 % — ABNORMAL HIGH (ref 0.0–0.2)

## 2024-06-03 LAB — CMP (CANCER CENTER ONLY)
ALT: 13 U/L (ref 0–44)
AST: 21 U/L (ref 15–41)
Albumin: 4 g/dL (ref 3.5–5.0)
Alkaline Phosphatase: 81 U/L (ref 38–126)
Anion gap: 9 (ref 5–15)
BUN: 36 mg/dL — ABNORMAL HIGH (ref 8–23)
CO2: 24 mmol/L (ref 22–32)
Calcium: 8.9 mg/dL (ref 8.9–10.3)
Chloride: 104 mmol/L (ref 98–111)
Creatinine: 1.62 mg/dL — ABNORMAL HIGH (ref 0.61–1.24)
GFR, Estimated: 43 mL/min — ABNORMAL LOW (ref >60)
Glucose, Bld: 154 mg/dL — ABNORMAL HIGH (ref 70–99)
Potassium: 4 mmol/L (ref 3.5–5.1)
Sodium: 137 mmol/L (ref 135–145)
Total Bilirubin: 2.9 mg/dL — ABNORMAL HIGH (ref 0.0–1.2)
Total Protein: 6.8 g/dL (ref 6.5–8.1)

## 2024-06-03 MED ORDER — LUSPATERCEPT-AAMT 75 MG ~~LOC~~ SOLR
1.0000 mg/kg | Freq: Once | SUBCUTANEOUS | Status: AC
  Administered 2024-06-03: 90 mg via SUBCUTANEOUS
  Filled 2024-06-03: qty 1.5

## 2024-06-03 MED ORDER — ATORVASTATIN CALCIUM 10 MG PO TABS: 10.0000 mg | ORAL_TABLET | Freq: Every day | ORAL | 3 refills | Status: DC

## 2024-06-03 NOTE — Assessment & Plan Note (Addendum)
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  Patient currently on epo agents [since April 2021]; hemoglobin between 8-9.     # Proceed with  Luspatercept  #6 today- Labs-CBC/chemistries were reviewed with the patient. ok with Luspatercept  today HOLD aranesp  today- stable; given the overall improvement in his hematocrit and hemoglobin-recommended trial of every 4-week injections.   # HTN- at home in 140s; BP- 150s- monitor closely- stable  # Hx Of A.fib [OFF eliquis  secondary to intracranial bleed]- s/p watchman device- on asprin ONLY- stable  # Hx of gout- recent falre- improved- continue allopurinol;   defer to [place]- PCP- Dr.Johnston in July 2025.    # Stage kidney disease-stage III-IV [Dr.Korrapati]- on lasix  10 mg twice a week.   # IV access: difficulty with IV access- hold off the port placement for now.   PS-  # DISPOSITION: mon/tues AM preference # ok with Luspatercept  today  # in 4 weeks- MD: labs- cbc/cmp; Luspatercept ;   #  in 8 weeks- MD: labs- cbc/cmp; Luspatercept ; # in 12 weeks- MD: labs- cbc/cmp; Luspatercept ;  - Dr.

## 2024-06-03 NOTE — Progress Notes (Signed)
 [facility] [address] [place]. [place] [address] [phone]       OFFICE FOLLOW-UP NOTE  [name] Date of Birth:  1946-04-26 Medical Record Number:  [id number]   HPI: Initial visit 06/07/2022 [name] is a 78 year old Caucasian male seen today for initial office follow-up visit following hospital admission for intracerebral hemorrhage in March 2023.  He is accompanied by his wife.  History is obtained from them and review of electronic medical records and opossum reviewed pertinent available imaging films in PACS. [name] is a 78 y.o. male with history of anemia, arthritis, GERD, DM2, HTN and a-fib on Eliquis  presened on 02/10/22 with vomiting and incoordiantion on the left side.  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  His Eliquis  was reversed with [name]  and he was transferred from outside hospital to [facility]..  His neurological status began to worsen and he was intubated.  Repeat CT demonstrated enlarging ICH.  Neurosurgery was consulted and decompressive suboccipital craniectomy was performed on 02/11/22 by Dr Colon.  3% HTS was started for cerebral edema. Patient has been extubated since 3/23 and did well.  Blood pressure goal tightly controlled.  Eliquis  was held.  He was seen by physical occupational speech therapy.  Serial CT scan showed stable appearance of the hemorrhage and mass effect and postoperative changes.  Echocardiogram showed ejection fraction of 55 to 60%.  LDL cholesterol is 25 mg percent.  Hemoglobin A1c is 6.2.  Patient was felt to be a good candidate for inpatient rehab and was transferred there and did well and made gradual improvement and was discharged home on 03/20/2022.  He still getting home physical and occupational therapy which she has finished and recently started outpatient therapies.  He is able to ambulate with a walker but somebody has to stay close to him.  His left-sided coordination is  improving but is not back to normal.  His voice occasionally gets raspy when he is tired and he has some word finding difficulties.  Wife has also noticed that his comprehension is not good and his information processing is also not back to normal.  She has to repeat herself several times at times.  Patient had a follow-up CT scan on 02/20/2022 which showed expected evolutionary changes and postop changes in the left cerebral hemorrhage.  He was restarted on Eliquis  which is tolerating well without bruising or bleeding.  Patient and wife are interested in the Watchman procedure and wants me to refer to Dr. Cindie.    Update 10/23/2022 : He returns for follow-up after last visit with me 4 months ago.  Is accompanied by his wife.  He is doing well.  He is noted improvement in his left hemiataxia walking better with a walker.  He has had no falls or injuries.  He still has some trouble with coordination of his left hand when he is trying to rush and not being attention.  He saw Dr. Cindie and underwent Watchman procedure successfully on 07/27/2019.  Without any complications.  He has stopped Eliquis  and is now on aspirin  milligram alone which is tolerating well without bruising or bleeding.  He states his blood pressure is in the 130s usually at home but today it is a little does not regularly check his blood sugars.  Last hemoglobin A1c was 6.7 in June.  He has not had any falls or injuries.  He blurred vision to see his eye doctor  weight loss from 203 to 184 pounds and.  I advised him to discuss with his primary care physician for further evaluation  Update 05/08/2023 ; he returns for follow-up after last visit 6 months ago.  He is accompanied by his wife.  Patient states he is doing well.  He did a course of physical occupational therapy in January February with has had improved his balance and walking.  He is currently doing a course of 12 injections of acupuncture by Dr. Alm Sander in [place] to see if it  helps improve his coordination and balance.  He feels he is regaining some increased sensitivity on the left side and now can feel the texture arthroplasty when he holds it in his left hand.  He has not had any recurrent stroke or TIA symptoms.  Remains on aspirin  which is tolerating well with minor bruising but no bleeding episodes.  He is only on Zetia  for his cholesterol and last lipid profile on 12/15/2022 showed LDL cholesterol to be suboptimal at 136 mg percent.  Hemoglobin A1c was 5.7.  He does plan to have repeat lipid profile checked at his upcoming annual physical exam with his PCP in a few weeks.  He is careful and uses walker all the time and has not had not had any falls or injuries. Update 06/03/2024 : He returns for follow-up after last visit a year ago.  He is accompanied by his wife.  He is continues to do well from neurovascular standpoint.  He has had no recurrent stroke or TIA symptoms.  He continues to have left-sided hemiataxia and gait and balance difficulties.  He does use his walker all the time.  He just had 1 fall when he slipped and fell.  He has had no major injuries.  He did use acupuncture injections in his left arm but did not get significant benefit.  He remains on aspirin  she is tolerating well without bruising or bleeding.  He had lab work done on 05/27/2024 and hemoglobin A1c was 5.5.  LDL cholesterol was yet suboptimal at 85 mg percent.  He remains on Zetia  10 mg and has been reluctant to start back statin so far.  He has no new complaints.  Continues to have mild short-term memory difficulties but these are unchanged and not progressive.  He has good days and bad days. ROS:   14 system review of systems is positive for incoordination, gait imbalance, blurred vision difficulty thinking, memory loss, word finding difficulty all other systems negative  PMH:  Past Medical History:  Diagnosis Date   Anemia    Aortic stenosis    Arthritis    Complication of anesthesia    hard  time getting bp up after knee replacement   Coronary artery disease    Diabetes mellitus without complication (HCC)    GERD (gastroesophageal reflux disease)    occ tums prn   History of hiatal hernia    Hypertension    MDS (myelodysplastic syndrome) (HCC)    Presence of Watchman left atrial appendage closure device 07/26/2022   27mm Watchman FLX with Dr. Cindie    Social History:  Social History   Socioeconomic History   Marital status: Married    Spouse name: Not on file   Number of children: 2   Years of education: Not on file   Highest education level: Not on file  Occupational History   Occupation: Retired - [employer]  Tobacco Use   Smoking status: Former  Current packs/day: 0.00    Average packs/day: 0.5 packs/day for 8.0 years (4.0 ttl pk-yrs)    Types: Cigarettes    Start date: 07/21/1970    Quit date: 07/21/1978    Years since quitting: 45.9   Smokeless tobacco: Never  Vaping Use   Vaping status: Never Used  Substance and Sexual Activity   Alcohol use: Yes    Alcohol/week: 0.0 - 1.0 standard drinks of alcohol    Comment: rare beer   Drug use: Never   Sexual activity: Not on file  Other Topics Concern   Not on file  Social History Narrative   Lives in [place]; with wife; quit smoking in early 22s; ocassional/ rare [may be 1 a month beer]. 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    Social Drivers of Corporate investment banker Strain: Not on file  Food Insecurity: No Food Insecurity (12/18/2022)   Hunger Vital Sign    Worried About Running Out of Food in the Last Year: Never true    Ran Out of Food in the Last Year: Never true  Transportation Needs: No Transportation Needs (12/18/2022)   PRAPARE - Administrator, Civil Service (Medical): No    Lack of Transportation (Non-Medical): No  Physical Activity: Not on file  Stress: Not on file  Social Connections: Not on file  Intimate Partner Violence: Not At Risk (12/18/2022)   Humiliation,  Afraid, Rape, and Kick questionnaire    Fear of Current or Ex-Partner: No    Emotionally Abused: No    Physically Abused: No    Sexually Abused: No    Medications:   Current Outpatient Medications on File Prior to Visit  Medication Sig Dispense Refill   aspirin  EC 81 MG tablet Take 1 tablet (81 mg total) by mouth daily. Swallow whole. 90 tablet 3   ezetimibe  (ZETIA ) 10 MG tablet Take 1 tablet (10 mg total) by mouth daily. 90 tablet 3   furosemide  (LASIX ) 20 MG tablet TAKE 0.5 TABLET DAILY. MAY TAKE EXTRA TABLET IF NEEDED 90 tablet 3   metFORMIN  (GLUCOPHAGE ) 500 MG tablet Take 500 mg by mouth 2 (two) times daily with a meal.     No current facility-administered medications on file prior to visit.    Allergies:  No Known Allergies  Physical Exam General: Obese elderly Caucasian male, seated, in no evident distress Head: head normocephalic and atraumatic.  Neck: supple with no carotid or supraclavicular bruits Cardiovascular: regular rate and rhythm, no murmurs Musculoskeletal: no deformity Skin:  no rash/petichiae Vascular:  Normal pulses all extremities Vitals:   06/03/24 1331  BP: (!) 147/82  Pulse: (!) 58   Neurologic Exam Mental Status: Awake and fully alert. Oriented to place and time. Recent and remote memory intact. Attention span, concentration and fund of knowledge appropriate. Mood and affect appropriate.  Diminished recall 2/3.  Able to name 13 animals which can walk on 4 legs. Cranial Nerves: Fundoscopic exam not done. Pupils equal, briskly reactive to light. Extraocular movements full without nystagmus. Visual fields full to confrontation. Hearing intact. Facial sensation intact. Face, tongue, palate moves normally and symmetrically.  Motor: Normal bulk and tone. Normal strength in all tested extremity muscles except slight increased tone in the left leg. Sensory.: intact to touch ,pinprick .position and vibratory sensation.  Coordination: Mildly impaired left  finger-to-nose and knee to heel coordination on the left. Gait and Station: Arises from chair without difficulty. Stance is broad-based uses a wheeled walker gait is slightly  ataxic with stiffness and dragging of the left leg. Reflexes: 1+ and symmetric. Toes downgoing.   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  I recommend he continue aspirin  for stroke prevention and maintain aggressive risk factor modification with strict control of hypertension with blood pressure goal below 140/90, lipids with LDL cholesterol goal below 70 mg and diabetes with hemoglobin A1c goal below 6.5%.  I advised him to use his walker at all times and we discussed fall safety precautions.  He also has mild cognitive impairment and encouraged him to increase participation in cognitively challenging activities like solving crossword puzzles, playing bridge, word searches and sudoku.  We also discussed memory compensation strategies.  I strongly encouraged him to start taking Lipitor 10 mg daily as this LDL cholesterol is yet suboptimally controlled despite Zetia .  He was advised to use his walker at all times and we discussed fall safety precautions.  Return for follow-up in the future in 1 year or call earlier if necessaryGreater than 50% of time during this 35 minute visit was spent on counseling,explanation of diagnosis, planning of further management, discussion with patient and family and coordination of care Eather Popp,  MD Note: This document was prepared with digital dictation and possible smart phrase technology. Any transcriptional errors that result from this process are unintentional

## 2024-06-03 NOTE — Progress Notes (Signed)
 No concerns today. Had some labs done on 05/27/24 as well as cbc and cmp today.

## 2024-06-03 NOTE — Progress Notes (Signed)
 [facility] CONSULT NOTE  Patient Care Team: Rudolpho Norleen BIRCH, MD as PCP - General (Internal Medicine) Cindie Ole DASEN, MD as PCP - Electrophysiology (Cardiology) O'Neal, Darryle Ned, MD as PCP - Cardiology (Cardiology) Rennie Cindy SAUNDERS, MD as Consulting Physician (Hematology and Oncology)  CHIEF COMPLAINTS/PURPOSE OF CONSULTATION: MDS  Oncology History Overview Note  # EGD-none; colonoscopy-never [Cologurad x2- NEG];FEB 2021- US - abdomen-no liver disease/ Mild splenic enlargement [460cc; kidney cysts]; B12 folic acid /LDH haptoglobin normal.  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   # Hx Of A.fib [OFF eliquis  secondary to intracranial bleed]- s/p watchman device- on asprin ONLY- stable   #  small side branch IPMNs. Given small size, initially established imaging stabilityFEB 2-25-fluid signal cystic lesion in the inferior pancreatic head measuring 0.6 cm and in the dorsal pancreatic neck measuring 1.4 x 0.7 cm, unchanged. No solid component or suspicious contrast enhancement. No pancreatic ductal dilatation or surrounding inflammatory changes. These are most consistent with, and patient age,  or characterization is required. No surveillance needed. 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  With wife.   Vernon [name] Molt 78 y.o.  male with history of recently diagnosed low-grade MDS; A-fib status post Watchman device; off Eliquis  [history of hemorrhagic stroke] currently on Luspatercept -  Patient has had issues with IV blood draw with PCP. Denies any falls.   Patient states to be compliant with his Lasix . Patient otherwise doing well.  Denies any bleeding or bruising.  Patient has not had any further strokes.   Review of Systems  Constitutional:  Positive for malaise/fatigue. Negative for chills, diaphoresis, fever and weight loss.  HENT:  Negative for nosebleeds and sore throat.   Eyes:  Negative for double vision.  Respiratory:  Negative for cough, hemoptysis, sputum production, shortness of breath and wheezing.   Cardiovascular:  Positive for leg swelling. Negative for chest pain, palpitations and orthopnea.  Gastrointestinal:  Negative for abdominal pain, blood in stool, constipation, diarrhea, heartburn, melena, nausea and vomiting.  Genitourinary:  Negative for dysuria, frequency and urgency.  Musculoskeletal:  Negative for back pain and joint pain.  Skin: Negative.  Negative for itching and rash.  Neurological:  Negative for dizziness, tingling, focal weakness, weakness and headaches.  Endo/Heme/Allergies:  Does not bruise/bleed easily.  Psychiatric/Behavioral:  Negative for depression. The patient is not nervous/anxious and does not have insomnia.     MEDICAL HISTORY:  Past Medical History:  Diagnosis Date   Anemia    Aortic stenosis    Arthritis    Complication of anesthesia    hard time getting bp up after knee replacement   Coronary artery disease    Diabetes mellitus without complication (HCC)    GERD (gastroesophageal reflux  disease)    occ tums prn   History of hiatal hernia    Hypertension    MDS  (myelodysplastic syndrome) (HCC)    Presence of Watchman left atrial appendage closure device 07/26/2022   27mm Watchman FLX with Dr. Cindie    SURGICAL HISTORY: Past Surgical History:  Procedure Laterality Date   CARPAL TUNNEL RELEASE  2012   CRANIOTOMY N/A 02/10/2022   Procedure: SUBOCCIPITAL CRANIECTOMY FOR EVACUATION OF CEREBELLAR HEMATOMA;  Surgeon: Colon Shove, MD;  Location: [facility] OR;  Service: Neurosurgery;  Laterality: N/A;   JOINT REPLACEMENT Right 2010   LEFT ATRIAL APPENDAGE OCCLUSION N/A 07/26/2022   Procedure: LEFT ATRIAL APPENDAGE OCCLUSION;  Surgeon: Cindie Ole DASEN, MD;  Location: [facility] INVASIVE CV LAB;  Service: Cardiovascular;  Laterality: N/A;   RIGHT/LEFT HEART CATH AND CORONARY ANGIOGRAPHY N/A 04/10/2023   Procedure: RIGHT/LEFT HEART CATH AND CORONARY ANGIOGRAPHY;  Surgeon: Anner Alm ORN, MD;  Location: [facility] INVASIVE CV LAB;  Service: Cardiovascular;  Laterality: N/A;   SHOULDER ARTHROSCOPY WITH ROTATOR CUFF REPAIR AND OPEN BICEPS TENODESIS Right 11/09/2019   Procedure: RIGHT SHOULDER ARTHROSCOPY WITH SUBSCAPULARIS REPAIR, SUBACROMIAL DECOMPRESSION,MINI OPEN ROTATOR CUFF REPAIR;  Surgeon: Tobie Priest, MD;  Location: [facility] ORS;  Service: Orthopedics;  Laterality: Right;   TEE WITHOUT CARDIOVERSION N/A 07/26/2022   Procedure: TRANSESOPHAGEAL ECHOCARDIOGRAM (TEE);  Surgeon: Cindie Ole DASEN, MD;  Location: [facility] INVASIVE CV LAB;  Service: Cardiovascular;  Laterality: N/A;    SOCIAL HISTORY: Social History   Socioeconomic History   Marital status: Married    Spouse name: Not on file   Number of children: 2   Years of education: Not on file   Highest education level: Not on file  Occupational History   Occupation: Retired - Academic librarian  Tobacco Use   Smoking status: Former    Current packs/day: 0.00    Average packs/day: 0.5 packs/day for 8.0 years (4.0 ttl pk-yrs)    Types: Cigarettes    Start date: 07/21/1970    Quit date: 07/21/1978    Years since quitting: 45.9    Smokeless tobacco: Never  Vaping Use   Vaping status: Never Used  Substance and Sexual Activity   Alcohol use: Yes    Alcohol/week: 0.0 - 1.0 standard drinks of alcohol    Comment: rare beer   Drug use: Never   Sexual activity: Not on file  Other Topics Concern   Not on file  Social History Narrative   Lives in [place]; with wife; quit smoking in early 68s; ocassional/ rare [may be 1 a month beer]. 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    Social Drivers of Corporate investment banker Strain: Not on file  Food Insecurity: No Food Insecurity (12/18/2022)   Hunger Vital Sign    Worried About Running Out of Food in the Last Year: Never true    Ran Out of Food in the Last Year: Never true  Transportation Needs: No Transportation Needs (12/18/2022)   PRAPARE - Administrator, Civil Service (Medical): No    Lack of Transportation (Non-Medical): No  Physical Activity: Not on file  Stress: Not on file  Social Connections: Not on file  Intimate Partner Violence: Not At Risk (12/18/2022)   Humiliation, Afraid, Rape, and Kick questionnaire    Fear of Current or Ex-Partner: No    Emotionally Abused: No    Physically Abused: No    Sexually Abused: No    FAMILY HISTORY: Family History  Problem Relation Age of  Onset   Cancer Maternal Uncle     ALLERGIES:  has no known allergies.  MEDICATIONS:  Current Outpatient Medications  Medication Sig Dispense Refill   aspirin  EC 81 MG tablet Take 1 tablet (81 mg total) by mouth daily. Swallow whole. 90 tablet 3   ezetimibe  (ZETIA ) 10 MG tablet Take 1 tablet (10 mg total) by mouth daily. 90 tablet 3   furosemide  (LASIX ) 20 MG tablet TAKE 0.5 TABLET DAILY. MAY TAKE EXTRA TABLET IF NEEDED 90 tablet 3   metFORMIN  (GLUCOPHAGE ) 500 MG tablet Take 500 mg by mouth 2 (two) times daily with a meal.     No current facility-administered medications for this visit.   Facility-Administered Medications Ordered in Other Visits  Medication  Dose Route Frequency Provider Last Rate Last Admin   luspatercept -aamt (REBLOZYL ) subcutaneous injection 90 mg  1 mg/kg (Treatment Plan Recorded) Subcutaneous Once Kerrilynn Derenzo R, MD          PHYSICAL EXAMINATION:   Vitals:   06/03/24 0902  BP: (!) 140/70  Pulse: 65  Resp: 18  Temp: (!) 97 F (36.1 C)  SpO2: 100%    Filed Weights   06/03/24 0902  Weight: 190 lb (86.2 kg)    Positive for abdominal distention.  Positive for leg swelling bilaterally.  Pitting  edema Physical Exam HENT:     Head: Normocephalic and atraumatic.     Mouth/Throat:     Pharynx: No oropharyngeal exudate.  Eyes:     Pupils: Pupils are equal, round, and reactive to light.  Cardiovascular:     Rate and Rhythm: Normal rate and regular rhythm.     Heart sounds: Murmur heard.  Pulmonary:     Effort: Pulmonary effort is normal. No respiratory distress.     Breath sounds: Normal breath sounds. No wheezing.  Abdominal:     General: Bowel sounds are normal. There is no distension.     Palpations: Abdomen is soft. There is no mass.     Tenderness: There is no abdominal tenderness. There is no guarding or rebound.  Musculoskeletal:        General: No tenderness. Normal range of motion.     Cervical back: Normal range of motion and neck supple.  Skin:    General: Skin is warm.  Neurological:     Mental Status: He is alert and oriented to person, place, and time.  Psychiatric:        Mood and Affect: Affect normal.    LABORATORY DATA:  I have reviewed the data as listed Lab Results  Component Value Date   WBC 6.0 06/03/2024   HGB 10.1 (L) 06/03/2024   HCT 29.7 (L) 06/03/2024   MCV 99.7 06/03/2024   PLT 268 06/03/2024   Recent Labs    04/15/24 1433 05/06/24 0903 06/03/24 0906  NA 137 138 137  K 4.3 4.7 4.0  CL 105 104 104  CO2 24 26 24   GLUCOSE 187* 137* 154*  BUN 33* 40* 36*  CREATININE 1.50* 1.58* 1.62*  CALCIUM  8.6* 9.0 8.9  GFRNONAA 48* 45* 43*  PROT 7.4 7.1 6.8   ALBUMIN  3.8 4.2 4.0  AST 26 27 21   ALT 21 22 13   ALKPHOS 116 107 81  BILITOT 2.2* 2.6* 2.9*     No results found.    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  Patient currently on epo agents [since April 2021]; hemoglobin between 8-9.     #  Proceed with  Luspatercept  #6 today- Labs-CBC/chemistries were reviewed with the patient. ok with Luspatercept  today HOLD aranesp  today- stable; given the overall improvement in his hematocrit and hemoglobin-recommended trial of every 4-week injections.   # HTN- at home in 140s; BP- 150s- monitor closely- stable  # Hx Of A.fib [OFF eliquis  secondary to intracranial bleed]- s/p watchman device- on asprin ONLY- stable  # Hx of gout- recent falre- improved- continue allopurinol;   defer to [place]- PCP- Dr.Johnston in July 2025.    # Stage kidney disease-stage III-IV [Dr.Korrapati]- on lasix  10 mg twice a week.   # IV access: difficulty with IV access- hold off the port placement for now.   PS-  # DISPOSITION: mon/tues AM preference # ok with Luspatercept  today  # in 4 weeks- MD: labs- cbc/cmp; Luspatercept ;   #  in 8 weeks- MD: labs- cbc/cmp; Luspatercept ; # in 12 weeks- MD: labs- cbc/cmp; Luspatercept ;  - Dr.      All questions were answered. The patient knows to call the clinic with any problems, questions or concerns.    Cindy JONELLE Joe, MD 06/03/2024 10:10 AM

## 2024-06-03 NOTE — Patient Instructions (Addendum)
 I had a long discussion with the patient and his wife regarding his recent cerebral hemorrhage related to anticoagulation with Eliquis  for his atrial fibrillation and he is doing well after having Watchman device.  I recommend he continue aspirin  for stroke prevention and maintain aggressive risk factor modification with strict control of hypertension with blood pressure goal below 140/90, lipids with LDL cholesterol goal below 70 mg and diabetes with hemoglobin A1c goal below 6.5%.  I advised him to use his walker at all times and we discussed fall safety precautions.  He also has mild cognitive impairment and encouraged him to increase participation in cognitively challenging activities like solving crossword puzzles, playing bridge, word searches and sudoku.  We also discussed memory compensation strategies.  I strongly encouraged him to start taking Lipitor 10 mg daily as this LDL cholesterol is yet suboptimally controlled despite Zetia .  He was advised to use his walker at all times and we discussed fall safety precautions.  Return for follow-up in the future in 1 year or call earlier if necessary Fall Prevention in the Home, Adult Falls can cause injuries and affect people of all ages. There are many simple things that you can do to make your home safe and to help prevent falls. If you need it, ask for help making these changes. What actions can I take to prevent falls? General information Use good lighting in all rooms. Make sure to: Replace any light bulbs that burn out. Turn on lights if it is dark and use night-lights. Keep items that you use often in easy-to-reach places. Lower the shelves around your home if needed. Move furniture so that there are clear paths around it. Do not keep throw rugs or other things on the floor that can make you trip. If any of your floors are uneven, fix them. Add color or contrast paint or tape to clearly mark and help you see: Grab bars or handrails. First  and last steps of staircases. Where the edge of each step is. If you use a ladder or stepladder: Make sure that it is fully opened. Do not climb a closed ladder. Make sure the sides of the ladder are locked in place. Have someone hold the ladder while you use it. Know where your pets are as you move through your home. What can I do in the bathroom?     Keep the floor dry. Clean up any water  that is on the floor right away. Remove soap buildup in the bathtub or shower. Buildup makes bathtubs and showers slippery. Use non-skid mats or decals on the floor of the bathtub or shower. Attach bath mats securely with double-sided, non-slip rug tape. If you need to sit down while you are in the shower, use a non-slip stool. Install grab bars by the toilet and in the bathtub and shower. Do not use towel bars as grab bars. What can I do in the bedroom? Make sure that you have a light by your bed that is easy to reach. Do not use any sheets or blankets on your bed that hang to the floor. Have a firm bench or chair with side arms that you can use for support when you get dressed. What can I do in the kitchen? Clean up any spills right away. If you need to reach something above you, use a sturdy step stool that has a grab bar. Keep electrical cables out of the way. Do not use floor polish or wax that makes floors slippery.  What can I do with my stairs? Do not leave anything on the stairs. Make sure that you have a light switch at the top and the bottom of the stairs. Have them installed if you do not have them. Make sure that there are handrails on both sides of the stairs. Fix handrails that are broken or loose. Make sure that handrails are as long as the staircases. Install non-slip stair treads on all stairs in your home if they do not have carpet. Avoid having throw rugs at the top or bottom of stairs, or secure the rugs with carpet tape to prevent them from moving. Choose a carpet design that  does not hide the edge of steps on the stairs. Make sure that carpet is firmly attached to the stairs. Fix any carpet that is loose or worn. What can I do on the outside of my home? Use bright outdoor lighting. Repair the edges of walkways and driveways and fix any cracks. Clear paths of anything that can make you trip, such as tools or rocks. Add color or contrast paint or tape to clearly mark and help you see high doorway thresholds. Trim any bushes or trees on the main path into your home. Check that handrails are securely fastened and in good repair. Both sides of all steps should have handrails. Install guardrails along the edges of any raised decks or porches. Have leaves, snow, and ice cleared regularly. Use sand, salt, or ice melt on walkways during winter months if you live where there is ice and snow. In the garage, clean up any spills right away, including grease or oil spills. What other actions can I take? Review your medicines with your health care provider. Some medicines can make you confused or feel dizzy. This can increase your chance of falling. Wear closed-toe shoes that fit well and support your feet. Wear shoes that have rubber soles and low heels. Use a cane, walker, scooter, or crutches that help you move around if needed. Talk with your provider about other ways that you can decrease your risk of falls. This may include seeing a physical therapist to learn to do exercises to improve movement and strength. Where to find more information Centers for Disease Control and Prevention, [employer]: TonerPromos.no [employer] on Aging: BaseRingTones.pl National Institute on Aging: BaseRingTones.pl Contact a health care provider if: You are afraid of falling at home. You feel weak, drowsy, or dizzy at home. You fall at home. Get help right away if you: Lose consciousness or have trouble moving after a fall. Have a fall that causes a head injury. These symptoms may be an emergency. Get help  right away. Call 911. Do not wait to see if the symptoms will go away. Do not drive yourself to the hospital. This information is not intended to replace advice given to you by your health care provider. Make sure you discuss any questions you have with your health care provider. Document Revised: 07/16/2022 Document Reviewed: 07/16/2022 Elsevier Patient Education  2024 [employer].

## 2024-06-05 ENCOUNTER — Other Ambulatory Visit: Payer: Self-pay

## 2024-06-17 ENCOUNTER — Ambulatory Visit

## 2024-06-17 ENCOUNTER — Other Ambulatory Visit

## 2024-06-17 ENCOUNTER — Ambulatory Visit: Admitting: Internal Medicine

## 2024-06-27 ENCOUNTER — Other Ambulatory Visit: Payer: Self-pay | Admitting: *Deleted

## 2024-06-27 DIAGNOSIS — D469 Myelodysplastic syndrome, unspecified: Secondary | ICD-10-CM

## 2024-07-01 ENCOUNTER — Ambulatory Visit: Admitting: Internal Medicine

## 2024-07-01 ENCOUNTER — Encounter: Payer: Self-pay | Admitting: Internal Medicine

## 2024-07-01 ENCOUNTER — Other Ambulatory Visit

## 2024-07-01 ENCOUNTER — Inpatient Hospital Stay: Attending: Internal Medicine

## 2024-07-01 ENCOUNTER — Inpatient Hospital Stay

## 2024-07-01 ENCOUNTER — Inpatient Hospital Stay: Admitting: Internal Medicine

## 2024-07-01 ENCOUNTER — Ambulatory Visit

## 2024-07-01 VITALS — BP 160/98 | HR 56 | Temp 96.3°F | Resp 18 | Ht 69.0 in | Wt 187.2 lb

## 2024-07-01 DIAGNOSIS — D461 Refractory anemia with ring sideroblasts: Secondary | ICD-10-CM | POA: Insufficient documentation

## 2024-07-01 DIAGNOSIS — D469 Myelodysplastic syndrome, unspecified: Secondary | ICD-10-CM

## 2024-07-01 LAB — CMP (CANCER CENTER ONLY)
ALT: 13 U/L (ref 0–44)
AST: 21 U/L (ref 15–41)
Albumin: 4.1 g/dL (ref 3.5–5.0)
Alkaline Phosphatase: 90 U/L (ref 38–126)
Anion gap: 8 (ref 5–15)
BUN: 39 mg/dL — ABNORMAL HIGH (ref 8–23)
CO2: 26 mmol/L (ref 22–32)
Calcium: 9.3 mg/dL (ref 8.9–10.3)
Chloride: 107 mmol/L (ref 98–111)
Creatinine: 1.72 mg/dL — ABNORMAL HIGH (ref 0.61–1.24)
GFR, Estimated: 40 mL/min — ABNORMAL LOW (ref >60)
Glucose, Bld: 125 mg/dL — ABNORMAL HIGH (ref 70–99)
Potassium: 4.7 mmol/L (ref 3.5–5.1)
Sodium: 141 mmol/L (ref 135–145)
Total Bilirubin: 2.7 mg/dL — ABNORMAL HIGH (ref 0.0–1.2)
Total Protein: 7 g/dL (ref 6.5–8.1)

## 2024-07-01 LAB — CBC WITH DIFFERENTIAL (CANCER CENTER ONLY)
Abs Immature Granulocytes: 0.04 K/uL (ref 0.00–0.07)
Basophils Absolute: 0.1 K/uL (ref 0.0–0.1)
Basophils Relative: 1 %
Eosinophils Absolute: 0.1 K/uL (ref 0.0–0.5)
Eosinophils Relative: 2 %
HCT: 32.8 % — ABNORMAL LOW (ref 39.0–52.0)
Hemoglobin: 10.7 g/dL — ABNORMAL LOW (ref 13.0–17.0)
Immature Granulocytes: 1 %
Lymphocytes Relative: 25 %
Lymphs Abs: 1.6 K/uL (ref 0.7–4.0)
MCH: 32.4 pg (ref 26.0–34.0)
MCHC: 32.6 g/dL (ref 30.0–36.0)
MCV: 99.4 fL (ref 80.0–100.0)
Monocytes Absolute: 0.6 K/uL (ref 0.1–1.0)
Monocytes Relative: 9 %
Neutro Abs: 4 K/uL (ref 1.7–7.7)
Neutrophils Relative %: 62 %
Platelet Count: 303 K/uL (ref 150–400)
RBC: 3.3 MIL/uL — ABNORMAL LOW (ref 4.22–5.81)
RDW: 26.1 % — ABNORMAL HIGH (ref 11.5–15.5)
WBC Count: 6.3 K/uL (ref 4.0–10.5)
nRBC: 0.8 % — ABNORMAL HIGH (ref 0.0–0.2)

## 2024-07-01 MED ORDER — LUSPATERCEPT-AAMT 75 MG ~~LOC~~ SOLR
1.0000 mg/kg | Freq: Once | SUBCUTANEOUS | Status: AC
  Administered 2024-07-01: 90 mg via SUBCUTANEOUS
  Filled 2024-07-01: qty 0.5

## 2024-07-01 NOTE — Progress Notes (Signed)
 No concerns today

## 2024-07-01 NOTE — Addendum Note (Signed)
 Addended by: JOSHUA ALFONSO CROME on: 07/01/2024 02:13 PM   Modules accepted: Orders

## 2024-07-01 NOTE — Progress Notes (Signed)
 [facility] CONSULT NOTE  Patient Care Team: Rudolpho Norleen BIRCH, MD as PCP - General (Internal Medicine) Cindie Ole DASEN, MD as PCP - Electrophysiology (Cardiology) O'Neal, Darryle Ned, MD as PCP - Cardiology (Cardiology) Rennie Cindy SAUNDERS, MD as Consulting Physician (Hematology and Oncology)  CHIEF COMPLAINTS/PURPOSE OF CONSULTATION: MDS  Oncology History Overview Note  # EGD-none; colonoscopy-never [Cologurad x2- NEG];FEB 2021- US - abdomen-no liver disease/ Mild splenic enlargement [460cc; kidney cysts]; B12 folic acid /LDH haptoglobin normal.  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   # Hx Of A.fib [OFF eliquis  secondary to intracranial bleed]- s/p watchman device- on asprin ONLY- stable   #  small side branch IPMNs. Given small size, initially established imaging stabilityFEB 2-25-fluid signal cystic lesion in the inferior pancreatic head measuring 0.6 cm and in the dorsal pancreatic neck measuring 1.4 x 0.7 cm, unchanged. No solid component or suspicious contrast enhancement. No pancreatic ductal dilatation or surrounding inflammatory changes. These are most consistent with, and patient age,  or characterization is required. No surveillance needed. 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  With wife.   Vernon [name] Molt 78 y.o.  male with history of recently diagnosed low-grade MDS; A-fib status post Watchman device; off Eliquis  [history of hemorrhagic stroke] currently on Luspatercept -  Patient states to be compliant with his Lasix . Patient otherwise doing well.  Denies any bleeding or bruising.  Patient has not had any further strokes.  No falls. No leg swelling.   Review of Systems  Constitutional:  Positive for malaise/fatigue. Negative for chills, diaphoresis, fever and weight loss.  HENT:  Negative for nosebleeds and sore throat.   Eyes:  Negative for double vision.  Respiratory:  Negative for cough, hemoptysis, sputum production, shortness of breath and wheezing.   Cardiovascular:  Positive for leg swelling. Negative for chest pain, palpitations and orthopnea.  Gastrointestinal:  Negative for abdominal pain, blood in stool, constipation, diarrhea, heartburn, melena, nausea and vomiting.  Genitourinary:  Negative for dysuria, frequency and urgency.  Musculoskeletal:  Negative for back pain and joint pain.  Skin: Negative.  Negative for itching and rash.  Neurological:  Negative for dizziness, tingling, focal weakness, weakness and headaches.  Endo/Heme/Allergies:  Does not bruise/bleed easily.  Psychiatric/Behavioral:  Negative for depression. The patient is not nervous/anxious and does not have insomnia.     MEDICAL HISTORY:  Past Medical History:  Diagnosis Date   Anemia    Aortic stenosis    Arthritis    Complication of anesthesia    hard time getting bp up after knee replacement   Coronary artery disease    Diabetes mellitus without complication (HCC)    GERD (gastroesophageal reflux disease)    occ tums prn  History of hiatal hernia    Hypertension    MDS (myelodysplastic syndrome) (HCC)    Presence of  Watchman left atrial appendage closure device 07/26/2022   27mm Watchman FLX with Dr. Cindie    SURGICAL HISTORY: Past Surgical History:  Procedure Laterality Date   CARPAL TUNNEL RELEASE  2012   CRANIOTOMY N/A 02/10/2022   Procedure: SUBOCCIPITAL CRANIECTOMY FOR EVACUATION OF CEREBELLAR HEMATOMA;  Surgeon: Colon Shove, MD;  Location: [facility] OR;  Service: Neurosurgery;  Laterality: N/A;   JOINT REPLACEMENT Right 2010   LEFT ATRIAL APPENDAGE OCCLUSION N/A 07/26/2022   Procedure: LEFT ATRIAL APPENDAGE OCCLUSION;  Surgeon: Cindie Ole DASEN, MD;  Location: [facility] INVASIVE CV LAB;  Service: Cardiovascular;  Laterality: N/A;   RIGHT/LEFT HEART CATH AND CORONARY ANGIOGRAPHY N/A 04/10/2023   Procedure: RIGHT/LEFT HEART CATH AND CORONARY ANGIOGRAPHY;  Surgeon: Anner Alm ORN, MD;  Location: [facility] INVASIVE CV LAB;  Service: Cardiovascular;  Laterality: N/A;   SHOULDER ARTHROSCOPY WITH ROTATOR CUFF REPAIR AND OPEN BICEPS TENODESIS Right 11/09/2019   Procedure: RIGHT SHOULDER ARTHROSCOPY WITH SUBSCAPULARIS REPAIR, SUBACROMIAL DECOMPRESSION,MINI OPEN ROTATOR CUFF REPAIR;  Surgeon: Tobie Priest, MD;  Location: [facility] ORS;  Service: Orthopedics;  Laterality: Right;   TEE WITHOUT CARDIOVERSION N/A 07/26/2022   Procedure: TRANSESOPHAGEAL ECHOCARDIOGRAM (TEE);  Surgeon: Cindie Ole DASEN, MD;  Location: [facility] INVASIVE CV LAB;  Service: Cardiovascular;  Laterality: N/A;    SOCIAL HISTORY: Social History   Socioeconomic History   Marital status: Married    Spouse name: Not on file   Number of children: 2   Years of education: Not on file   Highest education level: Not on file  Occupational History   Occupation: Retired - Academic librarian  Tobacco Use   Smoking status: Former    Current packs/day: 0.00    Average packs/day: 0.5 packs/day for 8.0 years (4.0 ttl pk-yrs)    Types: Cigarettes    Start date: 07/21/1970    Quit date: 07/21/1978    Years since quitting: 45.9   Smokeless tobacco: Never  Vaping Use   Vaping  status: Never Used  Substance and Sexual Activity   Alcohol use: Yes    Alcohol/week: 0.0 - 1.0 standard drinks of alcohol    Comment: rare beer   Drug use: Never   Sexual activity: Not on file  Other Topics Concern   Not on file  Social History Narrative   Lives in [place]; with wife; quit smoking in early 86s; ocassional/ rare [may be 1 a month beer]. 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    Social Drivers of Corporate investment banker Strain: Not on file  Food Insecurity: No Food Insecurity (12/18/2022)   Hunger Vital Sign    Worried About Running Out of Food in the Last Year: Never true    Ran Out of Food in the Last Year: Never true  Transportation Needs: No Transportation Needs (12/18/2022)   PRAPARE - Administrator, Civil Service (Medical): No    Lack of Transportation (Non-Medical): No  Physical Activity: Not on file  Stress: Not on file  Social Connections: Not on file  Intimate Partner Violence: Not At Risk (12/18/2022)   Humiliation, Afraid, Rape, and Kick questionnaire    Fear of Current or Ex-Partner: No    Emotionally Abused: No    Physically Abused: No    Sexually Abused: No    FAMILY HISTORY: Family History  Problem Relation Age of Onset   Cancer Maternal Uncle  ALLERGIES:  has no known allergies.  MEDICATIONS:  Current Outpatient Medications  Medication Sig Dispense Refill   aspirin  EC 81 MG tablet Take 1 tablet (81 mg total) by mouth daily. Swallow whole. 90 tablet 3   atorvastatin  (LIPITOR) 10 MG tablet Take 1 tablet (10 mg total) by mouth daily. 30 tablet 3   ezetimibe  (ZETIA ) 10 MG tablet Take 1 tablet (10 mg total) by mouth daily. 90 tablet 3   furosemide  (LASIX ) 20 MG tablet TAKE 0.5 TABLET DAILY. MAY TAKE EXTRA TABLET IF NEEDED 90 tablet 3   metFORMIN  (GLUCOPHAGE ) 500 MG tablet Take 500 mg by mouth 2 (two) times daily with a meal.     No current facility-administered medications for this visit.   Facility-Administered  Medications Ordered in Other Visits  Medication Dose Route Frequency Provider Last Rate Last Admin   luspatercept -aamt (REBLOZYL ) subcutaneous injection 90 mg  1 mg/kg (Treatment Plan Recorded) Subcutaneous Once Maci Eickholt R, MD          PHYSICAL EXAMINATION:   Vitals:   07/01/24 1254 07/01/24 1306  BP: (!) 190/102 (!) 160/98  Pulse: (!) 56   Resp: 18   Temp: (!) 96.3 F (35.7 C)   SpO2: 97%     Filed Weights   07/01/24 1254  Weight: 187 lb 3.2 oz (84.9 kg)    Positive for abdominal distention.  Positive for leg swelling bilaterally.  Pitting  edema Physical Exam HENT:     Head: Normocephalic and atraumatic.     Mouth/Throat:     Pharynx: No oropharyngeal exudate.  Eyes:     Pupils: Pupils are equal, round, and reactive to light.  Cardiovascular:     Rate and Rhythm: Normal rate and regular rhythm.     Heart sounds: Murmur heard.  Pulmonary:     Effort: Pulmonary effort is normal. No respiratory distress.     Breath sounds: Normal breath sounds. No wheezing.  Abdominal:     General: Bowel sounds are normal. There is no distension.     Palpations: Abdomen is soft. There is no mass.     Tenderness: There is no abdominal tenderness. There is no guarding or rebound.  Musculoskeletal:        General: No tenderness. Normal range of motion.     Cervical back: Normal range of motion and neck supple.  Skin:    General: Skin is warm.  Neurological:     Mental Status: He is alert and oriented to person, place, and time.  Psychiatric:        Mood and Affect: Affect normal.    LABORATORY DATA:  I have reviewed the data as listed Lab Results  Component Value Date   WBC 6.3 07/01/2024   HGB 10.7 (L) 07/01/2024   HCT 32.8 (L) 07/01/2024   MCV 99.4 07/01/2024   PLT 303 07/01/2024   Recent Labs    05/06/24 0903 06/03/24 0906 07/01/24 1243  NA 138 137 141  K 4.7 4.0 4.7  CL 104 104 107  CO2 26 24 26   GLUCOSE 137* 154* 125*  BUN 40* 36* 39*  CREATININE  1.58* 1.62* 1.72*  CALCIUM  9.0 8.9 9.3  GFRNONAA 45* 43* 40*  PROT 7.1 6.8 7.0  ALBUMIN  4.2 4.0 4.1  AST 27 21 21   ALT 22 13 13   ALKPHOS 107 81 90  BILITOT 2.6* 2.9* 2.7*     No results found.    MDS (myelodysplastic syndrome) (HCC) #Low-grade myelodysplastic syndrome-refractory anemia with ringed  sideroblasts.POSITIVE for SF3B1.  Patient currently on epo agents [since April 2021]; hemoglobin between 8-9.     # Proceed with  Luspatercept  #7 today- Labs-CBC/chemistries were reviewed with the patient. ok with Luspatercept -Given overall improvement in his hematocrit and hemoglobin- continue every 4-week injections.   # HTN- at home in 140s; BP- 150s- monitor closely- stable  # Hx Of A.fib [OFF eliquis  secondary to intracranial bleed]- s/p watchman device- on asprin ONLY- stable  # Hx of gout- recent falre- improved- continue allopurinol;   defer to [place]- PCP- Dr.Johnston in July 2025.    # Stage kidney disease-stage III-IV [Dr.Korrapati]- on lasix  10 mg twice a week- stable  # IV access: difficulty with IV access- hold off the port placement for now.   PS-  # DISPOSITION:   # ok with Luspatercept  today  # in 4 weeks- MD: labs- cbc/cmp; Luspatercept ;   #  in 8 weeks- MD: labs- cbc/cmp; Luspatercept ; # in 12 weeks- MD: labs- cbc/cmp; Luspatercept ;  - Dr.B      All questions were answered. The patient knows to call the clinic with any problems, questions or concerns.    Cindy JONELLE Joe, MD 07/01/2024 1:40 PM

## 2024-07-01 NOTE — Assessment & Plan Note (Addendum)
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  Patient currently on epo agents [since April 2021]; hemoglobin between 8-9.     # Proceed with  Luspatercept  #7 today- Labs-CBC/chemistries were reviewed with the patient. ok with Luspatercept -Given overall improvement in his hematocrit and hemoglobin- continue every 4-week injections.   # HTN- at home in 140s; BP- 150s- monitor closely- stable  # Hx Of A.fib [OFF eliquis  secondary to intracranial bleed]- s/p watchman device- on asprin ONLY- stable  # Hx of gout- recent falre- improved- continue allopurinol;   defer to [place]- PCP- Dr.Johnston in July 2025.    # Stage kidney disease-stage III-IV [Dr.Korrapati]- on lasix  10 mg twice a week- stable  # IV access: difficulty with IV access- hold off the port placement for now.   PS-  # DISPOSITION:   # ok with Luspatercept  today  # in 4 weeks- MD: labs- cbc/cmp; Luspatercept ;   #  in 8 weeks- MD: labs- cbc/cmp; Luspatercept ; # in 12 weeks- MD: labs- cbc/cmp; Luspatercept ;  - Dr.B

## 2024-07-13 NOTE — Progress Notes (Unsigned)
 Cardiology Office Note:  .   Date:  07/15/2024  ID:  Vernon [name], DOB [birth year], MRN [id number] PCP: Vernon Norleen JONETTA, MD  [facility] Providers Cardiologist:  [name], MD Electrophysiologist:  [name], MD   History of Present Illness: .    Chief Complaint  Patient presents with   Atrial Fibrillation   Follow-up    Vernon [name] is a 77 y.o. male with history of CAD, perm Afib, HFpEF, moderate AS, CKD 3a, pHTN, MDS who presents for follow-up.   History of Present Illness   Vernon [name] is a 78 year old male with coronary artery disease, permanent atrial fibrillation, and moderate aortic stenosis who presents for follow-up.  His myelodysplastic syndrome is stable with a new treatment regimen that has improved hemoglobin levels. Previously, he was receiving two different treatments every other week, but now he is on a single treatment once a month. He notes increased energy levels with this new regimen.  He reports that his doctors have told him his kidney function is stable. He is currently taking Lasix , half a tablet twice a week, which has helped reduce fluid retention. He mentions wearing compression socks when it is not too hot, which helps manage leg swelling. His weight has decreased to 188 pounds.  No chest pain or trouble breathing. Blood pressure readings are typically high at the start of medical visits but stabilize to around 140/80 mmHg. He monitors his blood pressure at home.  He is on Zetia  10 mg daily for hyperlipidemia, which he associates with nausea and a gag reflex in the mornings. He manages this by eating a slice of bread upon waking, which alleviates the symptoms. He has opted not to take statins or injectable cholesterol medications.  He has a history of permanent atrial fibrillation and has undergone a Watchman procedure. He is not on anticoagulation therapy and reports no issues with heart rate control.          Problem  List CVA -IPH 01/2022 -L side deficits  2. Permanent Afib -s/p Watchman 06/2022 3. CAD -CAC 5415 (97th percentile)  -Cardiac PET 02/26/2023: normal perfusion, MBFR 1.41 -> Microvascular disease  -LHC: 04/10/2023, 40% pRCA, 40% mid LAD, 20% mLCX  4. Moderate aortic stenosis  5. HTN 6. HLD -T chol 147, HDL 43, LDL 85, TG 93 7. MDS 8. Anemia 9. CKD IIIa 10. DM -A1c 5.7 11. HFpEF -50-55% 12. 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 Pertinent positives noted in the HPI.     Studies Reviewed: SABRA   EKG Interpretation Date/Time:  Wednesday July 15 2024 11:12:28 EDT Ventricular Rate:  62 PR Interval:    QRS Duration:  72 QT Interval:  398 QTC Calculation: 403 R Axis:   -47  Text Interpretation: Atrial fibrillation Left axis deviation Low voltage QRS Inferior Q waves noted Confirmed by Decent Darryle [phone]) on 07/15/2024 11:30:32 AM   TTE 02/23/2024    1. Left ventricular ejection fraction, by estimation, is 50%. Left  ventricular ejection fraction by 3D volume is 53 %. The left ventricle has  low normal function. The left ventricle has no regional wall motion  abnormalities. There is mild left  ventricular hypertrophy. Left ventricular diastolic parameters are  indeterminate.   2. Right ventricular systolic function is mildly reduced. The right  ventricular size is mildly enlarged. Tricuspid regurgitation signal is  inadequate for assessing PA  pressure.   3. Left atrial size was severely dilated.   4. Right atrial size was severely dilated.   5. A small pericardial effusion is present.   6. The mitral valve is degenerative. Moderate mitral valve regurgitation.  No evidence of mitral stenosis.   7. The aortic valve is tricuspid. There is severe calcifcation of the  aortic valve. Aortic valve regurgitation is mild. Moderate aortic valve  stenosis. Aortic valve area, by VTI measures 1.16 cm. Aortic valve mean   gradient measures 25.0 mmHg. Aortic  valve Vmax measures 3.21 m/s.   8. Ascending aorta measurements are within normal limits for age when  indexed to body surface area.   9. The inferior vena cava is normal in size with greater than 50%  respiratory variability, suggesting right atrial pressure of 3 mmHg.  Physical Exam:   VS:  BP (!) 168/68 (BP Location: Right Arm, Patient Position: Sitting, Cuff Size: Normal)   Pulse (!) 57   Resp 16   Ht 5' 9 (1.753 m)   Wt 195 lb 9.6 oz (88.7 kg)   SpO2 97%   BMI 28.89 kg/m    Wt Readings from Last 3 Encounters:  07/15/24 195 lb 9.6 oz (88.7 kg)  07/01/24 187 lb 3.2 oz (84.9 kg)  06/03/24 190 lb (86.2 kg)    GEN: Well nourished, well developed in no acute distress NECK: No JVD; No carotid bruits CARDIAC: irregular rhythm, 3/6 SEM, rubs, gallops RESPIRATORY:  Clear to auscultation without rales, wheezing or rhonchi  ABDOMEN: Soft, non-tender, non-distended EXTREMITIES:  1+ pitting edema ASSESSMENT AND PLAN: .   Assessment and Plan    Coronary artery disease Nonobstructive coronary artery disease with elevated coronary calcium  score. LDL is 85. He is not willing to take statins or Repatha. - Continue aspirin  81 mg daily. - Continue Zetia  10 mg daily.  Permanent atrial fibrillation status post Watchman device Permanent atrial fibrillation with controlled heart rate and no need for anticoagulation due to Watchman device.  Moderate aortic stenosis Moderate aortic stenosis confirmed by echocardiogram in March. Anemia can exacerbate murmur sounds. - Order repeat echocardiogram before next appointment in one year.  Chronic kidney disease stage 3A Chronic kidney disease stage 3A with well-managed kidney function.  Myelodysplastic syndrome Myelodysplastic syndrome with well-managed hemoglobin levels due to new treatment regimen. He reports increased energy levels.  Hyperlipidemia with medication-related intermittent nausea Hyperlipidemia  managed with Zetia , causing intermittent nausea and burping. Nausea occurs primarily in the morning and is alleviated by eating a slice of bread. He prefers to avoid statins and injectable cholesterol medications. - Continue Zetia  10 mg daily.  Lower extremity edema Lower extremity edema managed with Lasix  10 mg twice a week. Volume status is acceptable. He wears compression stockings when not too hot. - Continue Lasix  10 mg twice a week. - Advise wearing compression stockings as tolerated.  Hypertension Blood pressure is elevated in the office but stable at home. - Advise monitoring blood pressure at home.              Follow-up: Return in about 1 year (around 07/15/2025).  Signed, Darryle DASEN. Barbaraann, MD, [facility]  [facility]  [address] [place], [place] [address] [phone]  7:55 PM

## 2024-07-15 ENCOUNTER — Ambulatory Visit: Attending: Cardiovascular Disease | Admitting: Cardiovascular Disease

## 2024-07-15 ENCOUNTER — Encounter: Payer: Self-pay | Admitting: Cardiovascular Disease

## 2024-07-15 VITALS — BP 168/68 | HR 57 | Resp 16 | Ht 69.0 in | Wt 195.6 lb

## 2024-07-15 DIAGNOSIS — I2510 Atherosclerotic heart disease of native coronary artery without angina pectoris: Secondary | ICD-10-CM | POA: Diagnosis not present

## 2024-07-15 DIAGNOSIS — R931 Abnormal findings on diagnostic imaging of heart and coronary circulation: Secondary | ICD-10-CM

## 2024-07-15 DIAGNOSIS — E782 Mixed hyperlipidemia: Secondary | ICD-10-CM

## 2024-07-15 DIAGNOSIS — I350 Nonrheumatic aortic (valve) stenosis: Secondary | ICD-10-CM | POA: Diagnosis not present

## 2024-07-15 DIAGNOSIS — I4821 Permanent atrial fibrillation: Secondary | ICD-10-CM | POA: Diagnosis not present

## 2024-07-15 NOTE — Patient Instructions (Signed)
 Medication Instructions:  Your physician recommends that you continue on your current medications as directed. Please refer to the Current Medication list given to you today.  *If you need a refill on your cardiac medications before your next appointment, please call your pharmacy*   Testing/Procedures: Your physician has requested that you have an echocardiogram. Echocardiography is a painless test that uses sound waves to create images of your heart. It provides your doctor with information about the size and shape of your heart and how well your heart's chambers and valves are working. This procedure takes approximately one hour. There are no restrictions for this procedure. Please do NOT wear cologne, perfume, aftershave, or lotions (deodorant is allowed). Please arrive 15 minutes prior to your appointment time.  Please note: We ask at that you not bring children with you during ultrasound (echo/ vascular) testing. Due to room size and safety concerns, children are not allowed in the ultrasound rooms during exams. Our front office staff cannot provide observation of children in our lobby area while testing is being conducted. An adult accompanying a patient to their appointment will only be allowed in the ultrasound room at the discretion of the ultrasound technician under special circumstances. We apologize for any inconvenience. **To do in August 2026**   Follow-Up: At [facility], you and your health needs are our priority.  As part of our continuing mission to provide you with exceptional heart care, our providers are all part of one team.  This team includes your primary Cardiologist (physician) and Advanced Practice Providers or APPs (Physician Assistants and Nurse Practitioners) who all work together to provide you with the care you need, when you need it.  Your next appointment:   12 month(s)  Provider:   [name], MD    We recommend signing up for the patient  portal called MyChart.  Sign up information is provided on this After Visit Summary.  MyChart is used to connect with patients for Virtual Visits (Telemedicine).  Patients are able to view lab/test results, encounter notes, upcoming appointments, etc.  Non-urgent messages can be sent to your provider as well.   To learn more about what you can do with MyChart, go to [email or web address].

## 2024-07-16 ENCOUNTER — Other Ambulatory Visit: Payer: Self-pay

## 2024-07-29 ENCOUNTER — Inpatient Hospital Stay: Admitting: Internal Medicine

## 2024-07-29 ENCOUNTER — Ambulatory Visit: Admitting: Internal Medicine

## 2024-07-29 ENCOUNTER — Other Ambulatory Visit

## 2024-07-29 ENCOUNTER — Inpatient Hospital Stay

## 2024-07-29 ENCOUNTER — Encounter: Payer: Self-pay | Admitting: Internal Medicine

## 2024-07-29 ENCOUNTER — Ambulatory Visit

## 2024-07-29 ENCOUNTER — Inpatient Hospital Stay: Attending: Internal Medicine

## 2024-07-29 VITALS — BP 140/88 | HR 65 | Temp 97.5°F | Resp 16 | Ht 69.0 in | Wt 191.9 lb

## 2024-07-29 DIAGNOSIS — D469 Myelodysplastic syndrome, unspecified: Secondary | ICD-10-CM

## 2024-07-29 DIAGNOSIS — Z87891 Personal history of nicotine dependence: Secondary | ICD-10-CM | POA: Insufficient documentation

## 2024-07-29 DIAGNOSIS — N184 Chronic kidney disease, stage 4 (severe): Secondary | ICD-10-CM | POA: Insufficient documentation

## 2024-07-29 DIAGNOSIS — D461 Refractory anemia with ring sideroblasts: Secondary | ICD-10-CM | POA: Diagnosis present

## 2024-07-29 DIAGNOSIS — I129 Hypertensive chronic kidney disease with stage 1 through stage 4 chronic kidney disease, or unspecified chronic kidney disease: Secondary | ICD-10-CM | POA: Diagnosis not present

## 2024-07-29 LAB — CBC WITH DIFFERENTIAL (CANCER CENTER ONLY)
Abs Immature Granulocytes: 0.09 K/uL — ABNORMAL HIGH (ref 0.00–0.07)
Basophils Absolute: 0.1 K/uL (ref 0.0–0.1)
Basophils Relative: 1 %
Eosinophils Absolute: 0.1 K/uL (ref 0.0–0.5)
Eosinophils Relative: 2 %
HCT: 30.9 % — ABNORMAL LOW (ref 39.0–52.0)
Hemoglobin: 10.1 g/dL — ABNORMAL LOW (ref 13.0–17.0)
Immature Granulocytes: 1 %
Lymphocytes Relative: 17 %
Lymphs Abs: 1.3 K/uL (ref 0.7–4.0)
MCH: 32.2 pg (ref 26.0–34.0)
MCHC: 32.7 g/dL (ref 30.0–36.0)
MCV: 98.4 fL (ref 80.0–100.0)
Monocytes Absolute: 0.5 K/uL (ref 0.1–1.0)
Monocytes Relative: 7 %
Neutro Abs: 5.5 K/uL (ref 1.7–7.7)
Neutrophils Relative %: 72 %
Platelet Count: 309 K/uL (ref 150–400)
RBC: 3.14 MIL/uL — ABNORMAL LOW (ref 4.22–5.81)
RDW: 26.5 % — ABNORMAL HIGH (ref 11.5–15.5)
WBC Count: 7.6 K/uL (ref 4.0–10.5)
nRBC: 0.9 % — ABNORMAL HIGH (ref 0.0–0.2)

## 2024-07-29 LAB — CMP (CANCER CENTER ONLY)
ALT: 13 U/L (ref 0–44)
AST: 21 U/L (ref 15–41)
Albumin: 4 g/dL (ref 3.5–5.0)
Alkaline Phosphatase: 103 U/L (ref 38–126)
Anion gap: 8 (ref 5–15)
BUN: 26 mg/dL — ABNORMAL HIGH (ref 8–23)
CO2: 25 mmol/L (ref 22–32)
Calcium: 9.1 mg/dL (ref 8.9–10.3)
Chloride: 105 mmol/L (ref 98–111)
Creatinine: 1.56 mg/dL — ABNORMAL HIGH (ref 0.61–1.24)
GFR, Estimated: 45 mL/min — ABNORMAL LOW (ref >60)
Glucose, Bld: 134 mg/dL — ABNORMAL HIGH (ref 70–99)
Potassium: 4.5 mmol/L (ref 3.5–5.1)
Sodium: 138 mmol/L (ref 135–145)
Total Bilirubin: 2.2 mg/dL — ABNORMAL HIGH (ref 0.0–1.2)
Total Protein: 6.9 g/dL (ref 6.5–8.1)

## 2024-07-29 MED ORDER — LUSPATERCEPT-AAMT 75 MG ~~LOC~~ SOLR
1.0000 mg/kg | Freq: Once | SUBCUTANEOUS | Status: AC
  Administered 2024-07-29: 90 mg via SUBCUTANEOUS
  Filled 2024-07-29: qty 0.5

## 2024-07-29 NOTE — Progress Notes (Signed)
 No concerns today

## 2024-07-29 NOTE — Assessment & Plan Note (Signed)
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  Patient currently on epo agents [since April 2021]; hemoglobin between 8-9.     # Proceed with  Luspatercept  #7 today- Labs-CBC/chemistries were reviewed with the patient. ok with Luspatercept -Given overall improvement in his hematocrit and hemoglobin- continue every 4-week injections.   # HTN- at home in 140s; BP- 150s- monitor closely- stable  # Hx Of A.fib [OFF eliquis  secondary to intracranial bleed]- s/p watchman device- on asprin ONLY- stable  # Hx of gout- recent falre- improved- continue allopurinol;   defer to [place]- PCP- Dr.Johnston in July 2025.    # Stage kidney disease-stage III-IV [Dr.Korrapati]- on lasix  10 mg twice a week- stable  # IV access: difficulty with IV access- hold off the port placement for now.   PS-  # DISPOSITION:   # ok with Luspatercept  today  # in 4 weeks- MD: labs- cbc/cmp; Luspatercept ;   #  in 8 weeks- MD: labs- cbc/cmp; Luspatercept ;- switch to APP # in 12 weeks- MD: labs- cbc/cmp; Luspatercept ;  - Dr.B

## 2024-07-29 NOTE — Progress Notes (Signed)
 [facility] CONSULT NOTE  Patient Care Team: Rudolpho Norleen BIRCH, MD as PCP - General (Internal Medicine) Cindie Ole DASEN, MD as PCP - Electrophysiology (Cardiology) O'Neal, Darryle Ned, MD as PCP - Cardiology (Cardiology) Rennie Cindy SAUNDERS, MD as Consulting Physician (Hematology and Oncology)  CHIEF COMPLAINTS/PURPOSE OF CONSULTATION: MDS  Oncology History Overview Note  # EGD-none; colonoscopy-never [Cologurad x2- NEG];FEB 2021- US - abdomen-no liver disease/ Mild splenic enlargement [460cc; kidney cysts]; B12 folic acid /LDH haptoglobin normal.  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   # Hx Of A.fib [OFF eliquis  secondary to intracranial bleed]- s/p watchman device- on asprin ONLY- stable   #  small side branch IPMNs. Given small size, initially established imaging stabilityFEB 2-25-fluid signal cystic lesion in the inferior pancreatic head measuring 0.6 cm and in the dorsal pancreatic neck measuring 1.4 x 0.7 cm, unchanged. No solid component or suspicious contrast enhancement. No pancreatic ductal dilatation or surrounding inflammatory changes. These are most consistent with, and patient age,  or characterization is required. No surveillance needed. 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  With wife.   Vernon [name] Molt 78 y.o.  male with history of recently diagnosed low-grade MDS; A-fib status post Watchman device; off Eliquis  [history of hemorrhagic stroke] currently on Luspatercept -  Patient states to be compliant with his Lasix . Patient otherwise doing well.  Denies any bleeding or bruising.  Patient has not had any further strokes.  No falls. No leg swelling.   Review of Systems  Constitutional:  Positive for malaise/fatigue. Negative for chills, diaphoresis, fever and weight loss.  HENT:  Negative for nosebleeds and sore throat.   Eyes:  Negative for double vision.  Respiratory:  Negative for cough, hemoptysis, sputum production, shortness of breath and wheezing.   Cardiovascular:  Positive for leg swelling. Negative for chest pain, palpitations and orthopnea.  Gastrointestinal:  Negative for abdominal pain, blood in stool, constipation, diarrhea, heartburn, melena, nausea and vomiting.  Genitourinary:  Negative for dysuria, frequency and urgency.  Musculoskeletal:  Negative for back pain and joint pain.  Skin: Negative.  Negative for itching and rash.  Neurological:  Negative for dizziness, tingling, focal weakness, weakness and headaches.  Endo/Heme/Allergies:  Does not bruise/bleed easily.  Psychiatric/Behavioral:  Negative for depression. The patient is not nervous/anxious and does not have insomnia.     MEDICAL HISTORY:  Past Medical History:  Diagnosis Date   Anemia    Aortic stenosis    Arthritis    Complication of anesthesia    hard time getting bp up after knee replacement   Coronary artery disease    Diabetes mellitus without complication (HCC)    GERD (gastroesophageal reflux disease)    occ tums prn  History of hiatal hernia    Hypertension    MDS (myelodysplastic syndrome) (HCC)    Presence of  Watchman left atrial appendage closure device 07/26/2022   27mm Watchman FLX with Dr. Cindie    SURGICAL HISTORY: Past Surgical History:  Procedure Laterality Date   CARPAL TUNNEL RELEASE  2012   CRANIOTOMY N/A 02/10/2022   Procedure: SUBOCCIPITAL CRANIECTOMY FOR EVACUATION OF CEREBELLAR HEMATOMA;  Surgeon: Colon Shove, MD;  Location: [facility] OR;  Service: Neurosurgery;  Laterality: N/A;   JOINT REPLACEMENT Right 2010   LEFT ATRIAL APPENDAGE OCCLUSION N/A 07/26/2022   Procedure: LEFT ATRIAL APPENDAGE OCCLUSION;  Surgeon: Cindie Ole DASEN, MD;  Location: [facility] INVASIVE CV LAB;  Service: Cardiovascular;  Laterality: N/A;   RIGHT/LEFT HEART CATH AND CORONARY ANGIOGRAPHY N/A 04/10/2023   Procedure: RIGHT/LEFT HEART CATH AND CORONARY ANGIOGRAPHY;  Surgeon: Anner Alm ORN, MD;  Location: [facility] INVASIVE CV LAB;  Service: Cardiovascular;  Laterality: N/A;   SHOULDER ARTHROSCOPY WITH ROTATOR CUFF REPAIR AND OPEN BICEPS TENODESIS Right 11/09/2019   Procedure: RIGHT SHOULDER ARTHROSCOPY WITH SUBSCAPULARIS REPAIR, SUBACROMIAL DECOMPRESSION,MINI OPEN ROTATOR CUFF REPAIR;  Surgeon: Tobie Priest, MD;  Location: [facility] ORS;  Service: Orthopedics;  Laterality: Right;   TEE WITHOUT CARDIOVERSION N/A 07/26/2022   Procedure: TRANSESOPHAGEAL ECHOCARDIOGRAM (TEE);  Surgeon: Cindie Ole DASEN, MD;  Location: [facility] INVASIVE CV LAB;  Service: Cardiovascular;  Laterality: N/A;    SOCIAL HISTORY: Social History   Socioeconomic History   Marital status: Married    Spouse name: Not on file   Number of children: 2   Years of education: Not on file   Highest education level: Not on file  Occupational History   Occupation: Retired - Academic librarian  Tobacco Use   Smoking status: Former    Current packs/day: 0.00    Average packs/day: 0.5 packs/day for 8.0 years (4.0 ttl pk-yrs)    Types: Cigarettes    Start date: 07/21/1970    Quit date: 07/21/1978    Years since quitting: 46.0   Smokeless tobacco: Never  Vaping Use   Vaping  status: Never Used  Substance and Sexual Activity   Alcohol use: Yes    Alcohol/week: 0.0 - 1.0 standard drinks of alcohol    Comment: rare beer   Drug use: Never   Sexual activity: Not on file  Other Topics Concern   Not on file  Social History Narrative   Lives in [place]; with wife; quit smoking in early 67s; ocassional/ rare [may be 1 a month beer]. 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    Social Drivers of Corporate investment banker Strain: Not on file  Food Insecurity: No Food Insecurity (12/18/2022)   Hunger Vital Sign    Worried About Running Out of Food in the Last Year: Never true    Ran Out of Food in the Last Year: Never true  Transportation Needs: No Transportation Needs (12/18/2022)   PRAPARE - Administrator, Civil Service (Medical): No    Lack of Transportation (Non-Medical): No  Physical Activity: Not on file  Stress: Not on file  Social Connections: Not on file  Intimate Partner Violence: Not At Risk (12/18/2022)   Humiliation, Afraid, Rape, and Kick questionnaire    Fear of Current or Ex-Partner: No    Emotionally Abused: No    Physically Abused: No    Sexually Abused: No    FAMILY HISTORY: Family History  Problem Relation Age of Onset   Cancer Maternal Uncle  ALLERGIES:  has no known allergies.  MEDICATIONS:  Current Outpatient Medications  Medication Sig Dispense Refill   aspirin  EC 81 MG tablet Take 1 tablet (81 mg total) by mouth daily. Swallow whole. 90 tablet 3   ezetimibe  (ZETIA ) 10 MG tablet Take 1 tablet (10 mg total) by mouth daily. 90 tablet 3   furosemide  (LASIX ) 20 MG tablet TAKE 0.5 TABLET DAILY. MAY TAKE EXTRA TABLET IF NEEDED 90 tablet 3   metFORMIN  (GLUCOPHAGE ) 500 MG tablet Take 500 mg by mouth 2 (two) times daily with a meal.     No current facility-administered medications for this visit.   Facility-Administered Medications Ordered in Other Visits  Medication Dose Route Frequency Provider Last Rate Last  Admin   luspatercept -aamt (REBLOZYL ) subcutaneous injection 90 mg  1 mg/kg (Treatment Plan Recorded) Subcutaneous Once Fernando Torry R, MD          PHYSICAL EXAMINATION:   Vitals:   07/29/24 0949  BP: (!) 140/88  Pulse: 65  Resp: 16  Temp: (!) 97.5 F (36.4 C)  SpO2: 99%    Filed Weights   07/29/24 0949  Weight: 191 lb 14.4 oz (87 kg)    Positive for abdominal distention.  Positive for leg swelling bilaterally.  Pitting  edema Physical Exam HENT:     Head: Normocephalic and atraumatic.     Mouth/Throat:     Pharynx: No oropharyngeal exudate.  Eyes:     Pupils: Pupils are equal, round, and reactive to light.  Cardiovascular:     Rate and Rhythm: Normal rate. Rhythm irregular.     Heart sounds: Murmur heard.  Pulmonary:     Effort: Pulmonary effort is normal. No respiratory distress.     Breath sounds: Normal breath sounds. No wheezing.  Abdominal:     General: Bowel sounds are normal. There is no distension.     Palpations: Abdomen is soft. There is no mass.     Tenderness: There is no abdominal tenderness. There is no guarding or rebound.  Musculoskeletal:        General: No tenderness. Normal range of motion.     Cervical back: Normal range of motion and neck supple.  Skin:    General: Skin is warm.  Neurological:     Mental Status: He is alert and oriented to person, place, and time.  Psychiatric:        Mood and Affect: Affect normal.    LABORATORY DATA:  I have reviewed the data as listed Lab Results  Component Value Date   WBC 7.6 07/29/2024   HGB 10.1 (L) 07/29/2024   HCT 30.9 (L) 07/29/2024   MCV 98.4 07/29/2024   PLT 309 07/29/2024   Recent Labs    06/03/24 0906 07/01/24 1243 07/29/24 0952  NA 137 141 138  K 4.0 4.7 4.5  CL 104 107 105  CO2 24 26 25   GLUCOSE 154* 125* 134*  BUN 36* 39* 26*  CREATININE 1.62* 1.72* 1.56*  CALCIUM  8.9 9.3 9.1  GFRNONAA 43* 40* 45*  PROT 6.8 7.0 6.9  ALBUMIN  4.0 4.1 4.0  AST 21 21 21   ALT 13 13  13   ALKPHOS 81 90 103  BILITOT 2.9* 2.7* 2.2*     No results found.    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  Patient currently on epo agents [since April 2021]; hemoglobin between 8-9.     # Proceed with  Luspatercept  #7 today- Labs-CBC/chemistries were reviewed  with the patient. ok with Luspatercept -Given overall improvement in his hematocrit and hemoglobin- continue every 4-week injections.   # HTN- at home in 140s; BP- 150s- monitor closely- stable  # Hx Of A.fib [OFF eliquis  secondary to intracranial bleed]- s/p watchman device- on asprin ONLY- stable  # Hx of gout- recent falre- improved- continue allopurinol;   defer to [place]- PCP- Dr.Johnston in July 2025.    # Stage kidney disease-stage III-IV [Dr.Korrapati]- on lasix  10 mg twice a week- stable  # IV access: difficulty with IV access- hold off the port placement for now.   PS-  # DISPOSITION:   # ok with Luspatercept  today  # in 4 weeks- MD: labs- cbc/cmp; Luspatercept ;   #  in 8 weeks- MD: labs- cbc/cmp; Luspatercept ;- switch to APP # in 12 weeks- MD: labs- cbc/cmp; Luspatercept ;  - Dr.B      All questions were answered. The patient knows to call the clinic with any problems, questions or concerns.    Cindy JONELLE Joe, MD 07/29/2024 10:49 AM

## 2024-08-26 ENCOUNTER — Encounter: Payer: Self-pay | Admitting: Internal Medicine

## 2024-08-26 ENCOUNTER — Inpatient Hospital Stay

## 2024-08-26 ENCOUNTER — Inpatient Hospital Stay (HOSPITAL_BASED_OUTPATIENT_CLINIC_OR_DEPARTMENT_OTHER): Admitting: Internal Medicine

## 2024-08-26 ENCOUNTER — Ambulatory Visit: Admitting: Internal Medicine

## 2024-08-26 ENCOUNTER — Ambulatory Visit

## 2024-08-26 ENCOUNTER — Inpatient Hospital Stay: Attending: Internal Medicine

## 2024-08-26 ENCOUNTER — Other Ambulatory Visit

## 2024-08-26 VITALS — BP 140/86 | HR 63 | Temp 96.1°F | Resp 16 | Ht 69.0 in | Wt 192.0 lb

## 2024-08-26 DIAGNOSIS — D469 Myelodysplastic syndrome, unspecified: Secondary | ICD-10-CM

## 2024-08-26 DIAGNOSIS — D461 Refractory anemia with ring sideroblasts: Secondary | ICD-10-CM | POA: Insufficient documentation

## 2024-08-26 LAB — CBC WITH DIFFERENTIAL (CANCER CENTER ONLY)
Abs Immature Granulocytes: 0.06 K/uL (ref 0.00–0.07)
Basophils Absolute: 0 K/uL (ref 0.0–0.1)
Basophils Relative: 1 %
Eosinophils Absolute: 0.3 K/uL (ref 0.0–0.5)
Eosinophils Relative: 5 %
HCT: 32.5 % — ABNORMAL LOW (ref 39.0–52.0)
Hemoglobin: 10.5 g/dL — ABNORMAL LOW (ref 13.0–17.0)
Immature Granulocytes: 1 %
Lymphocytes Relative: 18 %
Lymphs Abs: 1.1 K/uL (ref 0.7–4.0)
MCH: 31.6 pg (ref 26.0–34.0)
MCHC: 32.3 g/dL (ref 30.0–36.0)
MCV: 97.9 fL (ref 80.0–100.0)
Monocytes Absolute: 0.5 K/uL (ref 0.1–1.0)
Monocytes Relative: 8 %
Neutro Abs: 4.3 K/uL (ref 1.7–7.7)
Neutrophils Relative %: 67 %
Platelet Count: 246 K/uL (ref 150–400)
RBC: 3.32 MIL/uL — ABNORMAL LOW (ref 4.22–5.81)
RDW: 26.2 % — ABNORMAL HIGH (ref 11.5–15.5)
WBC Count: 6.4 K/uL (ref 4.0–10.5)
nRBC: 0.8 % — ABNORMAL HIGH (ref 0.0–0.2)

## 2024-08-26 LAB — CMP (CANCER CENTER ONLY)
ALT: 12 U/L (ref 0–44)
AST: 21 U/L (ref 15–41)
Albumin: 4 g/dL (ref 3.5–5.0)
Alkaline Phosphatase: 87 U/L (ref 38–126)
Anion gap: 6 (ref 5–15)
BUN: 28 mg/dL — ABNORMAL HIGH (ref 8–23)
CO2: 27 mmol/L (ref 22–32)
Calcium: 9.1 mg/dL (ref 8.9–10.3)
Chloride: 106 mmol/L (ref 98–111)
Creatinine: 1.63 mg/dL — ABNORMAL HIGH (ref 0.61–1.24)
GFR, Estimated: 43 mL/min — ABNORMAL LOW (ref >60)
Glucose, Bld: 150 mg/dL — ABNORMAL HIGH (ref 70–99)
Potassium: 4.8 mmol/L (ref 3.5–5.1)
Sodium: 139 mmol/L (ref 135–145)
Total Bilirubin: 2.5 mg/dL — ABNORMAL HIGH (ref 0.0–1.2)
Total Protein: 6.8 g/dL (ref 6.5–8.1)

## 2024-08-26 MED ORDER — LUSPATERCEPT-AAMT 75 MG ~~LOC~~ SOLR
1.0000 mg/kg | Freq: Once | SUBCUTANEOUS | Status: AC
  Administered 2024-08-26: 90 mg via SUBCUTANEOUS
  Filled 2024-08-26: qty 1.5

## 2024-08-26 NOTE — Progress Notes (Signed)
 [name] since last visit reaching for walker that wasn't locked. No injuries. Was seen at [facility].

## 2024-08-26 NOTE — Assessment & Plan Note (Addendum)
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  Patient currently on epo agents [since April 2021]; hemoglobin between 8-9.     # Proceed with  Luspatercept  #8  today- Labs-CBC/chemistries were reviewed with the patient. ok with Luspatercept -Given overall improvement in his hematocrit and hemoglobin- continue every 4-week injections.   # HTN- at home in 140s; BP- 150s- monitor closely- stable  # Hx Of A.fib [OFF eliquis  secondary to intracranial bleed]- s/p watchman device- on asprin ONLY- stable  # Hx of gout- recent falre- improved- continue allopurinol;   defer to [place]- PCP- Dr.Johnston in July 2025.    # Stage kidney disease-stage III-IV [Dr.Korrapati]- on lasix  10 mg twice a week- stable  # IV access: difficulty with IV access- hold off the port placement for now- stable  PS- injection q 4 weeks; MD- q 12 weeks # DISPOSITION:   # ok with Luspatercept  today   as per IS-  # in 4 weeks-   labs- cbc/cmp; Luspatercept ;   #  in 8 weeks-  labs- cbc/cmp; Luspatercept ;- # in 12 weeks- MD: labs- cbc/cmp; Luspatercept ;  - Dr.B

## 2024-08-26 NOTE — Progress Notes (Signed)
 [facility] CONSULT NOTE  Patient Care Team: Vernon Norleen BIRCH, MD as PCP - General (Internal Medicine) Vernon Ole DASEN, MD as PCP - Electrophysiology (Cardiology) [name], Vernon Ned, MD as PCP - Cardiology (Cardiology) Rennie Vernon SAUNDERS, MD as Consulting Physician (Hematology and Oncology)  CHIEF COMPLAINTS/PURPOSE OF CONSULTATION: MDS  Oncology History Overview Note  # EGD-none; colonoscopy-never [Cologurad x2- NEG];FEB 2021- US - abdomen-no liver disease/ Mild splenic enlargement [460cc; kidney cysts]; B12 folic acid /LDH haptoglobin normal.  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   # Hx Of A.fib [OFF eliquis  secondary to intracranial bleed]- s/p watchman device- on asprin ONLY- stable   #  small side branch IPMNs. Given small size, initially established imaging stabilityFEB 2-25-fluid signal cystic lesion in the inferior pancreatic head measuring 0.6 cm and in the dorsal pancreatic neck measuring 1.4 x 0.7 cm, unchanged. No solid component or suspicious contrast enhancement. No pancreatic ductal dilatation or surrounding inflammatory changes. These are most consistent with, and patient age,  or characterization is required. No surveillance needed. 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  With wife.   Vernon [name] Vernon [name] 78 y.o.  male with history of recently diagnosed low-grade MDS; A-fib status post Watchman device; off Eliquis  [history of hemorrhagic stroke] currently on Luspatercept -  Vernon [name] since last visit reaching for walker that wasn't locked. No injuries. Was seen at [facility]   Patient states to be compliant with his Lasix . Patient otherwise doing well.  Denies any bleeding or bruising.  Patient has not had any further strokes.  No falls. No leg swelling.   Review of Systems  Constitutional:  Positive for malaise/fatigue. Negative for chills, diaphoresis, fever and weight loss.  HENT:  Negative for nosebleeds and sore throat.   Eyes:  Negative for double vision.  Respiratory:  Negative for cough, hemoptysis, sputum production, shortness of breath and wheezing.   Cardiovascular:  Positive for leg swelling. Negative for chest pain, palpitations and orthopnea.  Gastrointestinal:  Negative for abdominal pain, blood in stool, constipation, diarrhea, heartburn, melena, nausea and vomiting.  Genitourinary:  Negative for dysuria, frequency and urgency.  Musculoskeletal:  Negative for back pain and joint pain.  Skin: Negative.  Negative for itching and rash.  Neurological:  Negative for dizziness, tingling, focal weakness, weakness and headaches.  Endo/Heme/Allergies:  Does not bruise/bleed easily.  Psychiatric/Behavioral:  Negative for depression. The patient is not nervous/anxious and does not have insomnia.     MEDICAL HISTORY:  Past Medical History:  Diagnosis Date   Anemia    Aortic stenosis    Arthritis    Complication of anesthesia    hard time getting bp up after knee replacement   Coronary artery disease    Diabetes  mellitus without complication (HCC)    GERD (gastroesophageal reflux disease)    occ tums prn    History of hiatal hernia    Hypertension    MDS (myelodysplastic syndrome) (HCC)    Presence of Watchman left atrial appendage closure device 07/26/2022   27mm Watchman FLX with Dr. Cindie    SURGICAL HISTORY: Past Surgical History:  Procedure Laterality Date   CARPAL TUNNEL RELEASE  2012   CRANIOTOMY N/A 02/10/2022   Procedure: SUBOCCIPITAL CRANIECTOMY FOR EVACUATION OF CEREBELLAR HEMATOMA;  Surgeon: Vernon Shove, MD;  Location: [facility] OR;  Service: Neurosurgery;  Laterality: N/A;   JOINT REPLACEMENT Right 2010   LEFT ATRIAL APPENDAGE OCCLUSION N/A 07/26/2022   Procedure: LEFT ATRIAL APPENDAGE OCCLUSION;  Surgeon: Vernon Ole DASEN, MD;  Location: [facility] INVASIVE CV LAB;  Service: Cardiovascular;  Laterality: N/A;   RIGHT/LEFT HEART CATH AND CORONARY ANGIOGRAPHY N/A 04/10/2023   Procedure: RIGHT/LEFT HEART CATH AND CORONARY ANGIOGRAPHY;  Surgeon: Vernon Alm ORN, MD;  Location: [facility] INVASIVE CV LAB;  Service: Cardiovascular;  Laterality: N/A;   SHOULDER ARTHROSCOPY WITH ROTATOR CUFF REPAIR AND OPEN BICEPS TENODESIS Right 11/09/2019   Procedure: RIGHT SHOULDER ARTHROSCOPY WITH SUBSCAPULARIS REPAIR, SUBACROMIAL DECOMPRESSION,MINI OPEN ROTATOR CUFF REPAIR;  Surgeon: Vernon Priest, MD;  Location: [facility] ORS;  Service: Orthopedics;  Laterality: Right;   TEE WITHOUT CARDIOVERSION N/A 07/26/2022   Procedure: TRANSESOPHAGEAL ECHOCARDIOGRAM (TEE);  Surgeon: Vernon Ole DASEN, MD;  Location: [facility] INVASIVE CV LAB;  Service: Cardiovascular;  Laterality: N/A;    SOCIAL HISTORY: Social History   Socioeconomic History   Marital status: Married    Spouse name: Not on file   Number of children: 2   Years of education: Not on file   Highest education level: Not on file  Occupational History   Occupation: Retired - Academic librarian  Tobacco Use   Smoking status: Former    Current packs/day: 0.00    Average packs/day: 0.5 packs/day for 8.0 years (4.0 ttl pk-yrs)    Types: Cigarettes    Start date: 07/21/1970    Quit  date: 07/21/1978    Years since quitting: 46.1   Smokeless tobacco: Never  Vaping Use   Vaping status: Never Used  Substance and Sexual Activity   Alcohol use: Yes    Alcohol/week: 0.0 - 1.0 standard drinks of alcohol    Comment: rare beer   Drug use: Never   Sexual activity: Not on file  Other Topics Concern   Not on file  Social History Narrative   Lives in [place]; with wife; quit smoking in early 65s; ocassional/ rare [may be 1 a month beer]. 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    Social Drivers of Corporate investment banker Strain: Not on file  Food Insecurity: No Food Insecurity (12/18/2022)   Hunger Vital Sign    Worried About Running Out of Food in the Last Year: Never true    Ran Out of Food in the Last Year: Never true  Transportation Needs: No Transportation Needs (12/18/2022)   PRAPARE - Administrator, Civil Service (Medical): No    Lack of Transportation (Non-Medical): No  Physical Activity: Not on file  Stress: Not on file  Social Connections: Not on file  Intimate Partner Violence: Not At Risk (12/18/2022)   Humiliation, Afraid, Rape, and Kick questionnaire    Fear of Current or Ex-Partner: No    Emotionally Abused: No    Physically Abused: No    Sexually Abused: No  FAMILY HISTORY: Family History  Problem Relation Age of Onset   Cancer Maternal Uncle     ALLERGIES:  has no known allergies.  MEDICATIONS:  Current Outpatient Medications  Medication Sig Dispense Refill   aspirin  EC 81 MG tablet Take 1 tablet (81 mg total) by mouth daily. Swallow whole. 90 tablet 3   ezetimibe  (ZETIA ) 10 MG tablet Take 1 tablet (10 mg total) by mouth daily. 90 tablet 3   furosemide  (LASIX ) 20 MG tablet TAKE 0.5 TABLET DAILY. MAY TAKE EXTRA TABLET IF NEEDED 90 tablet 3   metFORMIN  (GLUCOPHAGE ) 500 MG tablet Take 500 mg by mouth 2 (two) times daily with a meal.     No current facility-administered medications for this visit.      PHYSICAL  EXAMINATION:   Vitals:   08/26/24 0850  BP: (!) 140/86  Pulse: 63  Resp: 16  Temp: (!) 96.1 F (35.6 C)  SpO2: 97%    Filed Weights   08/26/24 0850  Weight: 192 lb (87.1 kg)    Positive for abdominal distention.  Positive for leg swelling bilaterally.  Pitting  edema Physical Exam HENT:     Head: Normocephalic and atraumatic.     Mouth/Throat:     Pharynx: No oropharyngeal exudate.  Eyes:     Pupils: Pupils are equal, round, and reactive to light.  Cardiovascular:     Rate and Rhythm: Normal rate. Rhythm irregular.     Heart sounds: Murmur heard.  Pulmonary:     Effort: Pulmonary effort is normal. No respiratory distress.     Breath sounds: Normal breath sounds. No wheezing.  Abdominal:     General: Bowel sounds are normal. There is no distension.     Palpations: Abdomen is soft. There is no mass.     Tenderness: There is no abdominal tenderness. There is no guarding or rebound.  Musculoskeletal:        General: No tenderness. Normal range of motion.     Cervical back: Normal range of motion and neck supple.  Skin:    General: Skin is warm.  Neurological:     Mental Status: He is alert and oriented to person, place, and time.  Psychiatric:        Mood and Affect: Affect normal.    LABORATORY DATA:  I have reviewed the data as listed Lab Results  Component Value Date   WBC 6.4 08/26/2024   HGB 10.5 (L) 08/26/2024   HCT 32.5 (L) 08/26/2024   MCV 97.9 08/26/2024   PLT 246 08/26/2024   Recent Labs    07/01/24 1243 07/29/24 0952 08/26/24 0849  NA 141 138 139  K 4.7 4.5 4.8  CL 107 105 106  CO2 26 25 27   GLUCOSE 125* 134* 150*  BUN 39* 26* 28*  CREATININE 1.72* 1.56* 1.63*  CALCIUM  9.3 9.1 9.1  GFRNONAA 40* 45* 43*  PROT 7.0 6.9 6.8  ALBUMIN  4.1 4.0 4.0  AST 21 21 21   ALT 13 13 12   ALKPHOS 90 103 87  BILITOT 2.7* 2.2* 2.5*     No results found.    MDS (myelodysplastic syndrome) (HCC) #Low-grade myelodysplastic syndrome-refractory anemia  with ringed sideroblasts.POSITIVE for SF3B1.  Patient currently on epo agents [since April 2021]; hemoglobin between 8-9.     # Proceed with  Luspatercept  #8  today- Labs-CBC/chemistries were reviewed with the patient. ok with Luspatercept -Given overall improvement in his hematocrit and hemoglobin- continue every 4-week injections.   # HTN- at home in  140s; BP- 150s- monitor closely- stable  # Hx Of A.fib [OFF eliquis  secondary to intracranial bleed]- s/p watchman device- on asprin ONLY- stable  # Hx of gout- recent falre- improved- continue allopurinol;   defer to [place]- PCP- Dr.Johnston in July 2025.    # Stage kidney disease-stage III-IV [Dr.Korrapati]- on lasix  10 mg twice a week- stable  # IV access: difficulty with IV access- hold off the port placement for now- stable  PS- injection q 4 weeks; MD- q 12 weeks # DISPOSITION:   # ok with Luspatercept  today   as per IS-  # in 4 weeks-   labs- cbc/cmp; Luspatercept ;   #  in 8 weeks-  labs- cbc/cmp; Luspatercept ;- # in 12 weeks- MD: labs- cbc/cmp; Luspatercept ;  - Dr.B      All questions were answered. The patient knows to call the clinic with any problems, questions or concerns.    Vernon JONELLE Joe, MD 08/26/2024 10:16 AM

## 2024-09-22 ENCOUNTER — Encounter: Payer: Self-pay | Admitting: Internal Medicine

## 2024-09-23 ENCOUNTER — Telehealth: Payer: Self-pay

## 2024-09-23 ENCOUNTER — Inpatient Hospital Stay

## 2024-09-23 ENCOUNTER — Ambulatory Visit: Admitting: Internal Medicine

## 2024-09-23 ENCOUNTER — Ambulatory Visit

## 2024-09-23 DIAGNOSIS — D469 Myelodysplastic syndrome, unspecified: Secondary | ICD-10-CM

## 2024-09-23 DIAGNOSIS — D461 Refractory anemia with ring sideroblasts: Secondary | ICD-10-CM | POA: Diagnosis not present

## 2024-09-23 LAB — CBC WITH DIFFERENTIAL (CANCER CENTER ONLY)
Abs Immature Granulocytes: 0.05 K/uL (ref 0.00–0.07)
Basophils Absolute: 0 K/uL (ref 0.0–0.1)
Basophils Relative: 1 %
Eosinophils Absolute: 0.2 K/uL (ref 0.0–0.5)
Eosinophils Relative: 3 %
HCT: 31.3 % — ABNORMAL LOW (ref 39.0–52.0)
Hemoglobin: 10.5 g/dL — ABNORMAL LOW (ref 13.0–17.0)
Immature Granulocytes: 1 %
Lymphocytes Relative: 27 %
Lymphs Abs: 1.7 K/uL (ref 0.7–4.0)
MCH: 31.6 pg (ref 26.0–34.0)
MCHC: 33.5 g/dL (ref 30.0–36.0)
MCV: 94.3 fL (ref 80.0–100.0)
Monocytes Absolute: 0.5 K/uL (ref 0.1–1.0)
Monocytes Relative: 8 %
Neutro Abs: 3.8 K/uL (ref 1.7–7.7)
Neutrophils Relative %: 60 %
Platelet Count: 295 K/uL (ref 150–400)
RBC: 3.32 MIL/uL — ABNORMAL LOW (ref 4.22–5.81)
RDW: 26 % — ABNORMAL HIGH (ref 11.5–15.5)
WBC Count: 6.2 K/uL (ref 4.0–10.5)
nRBC: 1.5 % — ABNORMAL HIGH (ref 0.0–0.2)

## 2024-09-23 LAB — CMP (CANCER CENTER ONLY)
ALT: 15 U/L (ref 0–44)
AST: 23 U/L (ref 15–41)
Albumin: 4 g/dL (ref 3.5–5.0)
Alkaline Phosphatase: 87 U/L (ref 38–126)
Anion gap: 7 (ref 5–15)
BUN: 25 mg/dL — ABNORMAL HIGH (ref 8–23)
CO2: 25 mmol/L (ref 22–32)
Calcium: 8.7 mg/dL — ABNORMAL LOW (ref 8.9–10.3)
Chloride: 106 mmol/L (ref 98–111)
Creatinine: 1.47 mg/dL — ABNORMAL HIGH (ref 0.61–1.24)
GFR, Estimated: 49 mL/min — ABNORMAL LOW (ref >60)
Glucose, Bld: 145 mg/dL — ABNORMAL HIGH (ref 70–99)
Potassium: 4.3 mmol/L (ref 3.5–5.1)
Sodium: 138 mmol/L (ref 135–145)
Total Bilirubin: 2.1 mg/dL — ABNORMAL HIGH (ref 0.0–1.2)
Total Protein: 6.7 g/dL (ref 6.5–8.1)

## 2024-09-23 NOTE — Progress Notes (Signed)
 BP elevated today.  Discussed with Morna SQUIBB, NP and Luauren A, NP who would like patient to hole Luspatercept  today and return in 1 week for inj.  Confirmed with patient that he is taking Lasix  20 mg 1/2 tab Monday and Tuesday.  They will monitor home bp readings and contact PCP regarding hypertension.

## 2024-09-23 NOTE — Telephone Encounter (Signed)
 BP elevated today.  Discussed with Morna SQUIBB, NP and Luauren A, NP who would like patient to hold Luspatercept  today and return in 1 week for inj.  Confirmed with patient that he is taking Lasix  20 mg 1/2 tab Monday and Tuesday.  They will monitor home bp readings and contact PCP regarding hypertension.

## 2024-09-24 ENCOUNTER — Encounter: Payer: Self-pay | Admitting: Internal Medicine

## 2024-09-30 ENCOUNTER — Other Ambulatory Visit: Payer: Self-pay | Admitting: Internal Medicine

## 2024-09-30 ENCOUNTER — Inpatient Hospital Stay: Attending: Internal Medicine

## 2024-09-30 ENCOUNTER — Encounter: Payer: Self-pay | Admitting: Internal Medicine

## 2024-09-30 VITALS — BP 152/84

## 2024-09-30 DIAGNOSIS — D469 Myelodysplastic syndrome, unspecified: Secondary | ICD-10-CM

## 2024-09-30 MED ORDER — LUSPATERCEPT-AAMT 75 MG ~~LOC~~ SOLR
1.0000 mg/kg | Freq: Once | SUBCUTANEOUS | Status: AC
  Administered 2024-09-30: 90 mg via SUBCUTANEOUS
  Filled 2024-09-30: qty 1.5

## 2024-09-30 MED ORDER — DARBEPOETIN ALFA 500 MCG/ML IJ SOSY: 500.0000 ug | PREFILLED_SYRINGE | Freq: Once | INTRAMUSCULAR | Status: DC

## 2024-10-13 ENCOUNTER — Emergency Department

## 2024-10-13 ENCOUNTER — Emergency Department
Admission: EM | Admit: 2024-10-13 | Discharge: 2024-10-13 | Disposition: A | Discharge status: Home / Self Care | Attending: Emergency Medicine | Admitting: Emergency Medicine

## 2024-10-13 ENCOUNTER — Other Ambulatory Visit: Payer: Self-pay

## 2024-10-13 DIAGNOSIS — N189 Chronic kidney disease, unspecified: Secondary | ICD-10-CM | POA: Insufficient documentation

## 2024-10-13 DIAGNOSIS — Z8673 Personal history of transient ischemic attack (TIA), and cerebral infarction without residual deficits: Secondary | ICD-10-CM | POA: Diagnosis not present

## 2024-10-13 DIAGNOSIS — I509 Heart failure, unspecified: Secondary | ICD-10-CM | POA: Diagnosis not present

## 2024-10-13 DIAGNOSIS — R7989 Other specified abnormal findings of blood chemistry: Secondary | ICD-10-CM | POA: Diagnosis not present

## 2024-10-13 DIAGNOSIS — E1122 Type 2 diabetes mellitus with diabetic chronic kidney disease: Secondary | ICD-10-CM | POA: Diagnosis not present

## 2024-10-13 DIAGNOSIS — I130 Hypertensive heart and chronic kidney disease with heart failure and stage 1 through stage 4 chronic kidney disease, or unspecified chronic kidney disease: Secondary | ICD-10-CM | POA: Diagnosis present

## 2024-10-13 DIAGNOSIS — R030 Elevated blood-pressure reading, without diagnosis of hypertension: Secondary | ICD-10-CM

## 2024-10-13 LAB — BASIC METABOLIC PANEL WITH GFR
Anion gap: 10 (ref 5–15)
BUN: 27 mg/dL — ABNORMAL HIGH (ref 8–23)
CO2: 24 mmol/L (ref 22–32)
Calcium: 9.1 mg/dL (ref 8.9–10.3)
Chloride: 105 mmol/L (ref 98–111)
Creatinine, Ser: 1.59 mg/dL — ABNORMAL HIGH (ref 0.61–1.24)
GFR, Estimated: 44 mL/min — ABNORMAL LOW (ref >60)
Glucose, Bld: 110 mg/dL — ABNORMAL HIGH (ref 70–99)
Potassium: 4.6 mmol/L (ref 3.5–5.1)
Sodium: 140 mmol/L (ref 135–145)

## 2024-10-13 LAB — CBC WITH DIFFERENTIAL/PLATELET
Abs Immature Granulocytes: 0.04 K/uL (ref 0.00–0.07)
Basophils Absolute: 0.1 K/uL (ref 0.0–0.1)
Basophils Relative: 1 %
Eosinophils Absolute: 0.2 K/uL (ref 0.0–0.5)
Eosinophils Relative: 3 %
HCT: 32.3 % — ABNORMAL LOW (ref 39.0–52.0)
Hemoglobin: 10.7 g/dL — ABNORMAL LOW (ref 13.0–17.0)
Immature Granulocytes: 1 %
Lymphocytes Relative: 24 %
Lymphs Abs: 1.4 K/uL (ref 0.7–4.0)
MCH: 31.7 pg (ref 26.0–34.0)
MCHC: 33.1 g/dL (ref 30.0–36.0)
MCV: 95.6 fL (ref 80.0–100.0)
Monocytes Absolute: 0.5 K/uL (ref 0.1–1.0)
Monocytes Relative: 8 %
Neutro Abs: 3.8 K/uL (ref 1.7–7.7)
Neutrophils Relative %: 63 %
Platelets: 312 K/uL (ref 150–400)
RBC: 3.38 MIL/uL — ABNORMAL LOW (ref 4.22–5.81)
RDW: 26.6 % — ABNORMAL HIGH (ref 11.5–15.5)
Smear Review: NORMAL
WBC: 5.9 K/uL (ref 4.0–10.5)
nRBC: 0.5 % — ABNORMAL HIGH (ref 0.0–0.2)

## 2024-10-13 LAB — PRO BRAIN NATRIURETIC PEPTIDE: Pro Brain Natriuretic Peptide: 2838 pg/mL — ABNORMAL HIGH (ref <300.0)

## 2024-10-13 NOTE — ED Triage Notes (Signed)
 EMS.  NO SX.  HTN.

## 2024-10-13 NOTE — ED Provider Notes (Signed)
 [facility] Emergency Department Provider Note     Event Date/Time   First MD Initiated Contact with Patient 10/13/24 1525     (approximate)   History   Hypertension   HPI  Vernon [name] is a 78 y.o. male with a history of myelodysplastic syndrome, CVA, HTN, A-fib, HLD, DM type II, CKD, and CHF, presents to the ED via EMS from home.  Patient has a in-home sitter who stays with him during the day.  Sitter endorses elevated blood pressure with a reading today 187/106.  Patient denies any associated chest pain, shortness of breath, cough, or congestion.  Patient had his lisinopril started 1 week ago, and has also been advised to take his Lasix  2 times a week.  Patient presents today, unsure if he took his blood pressure medicine as prescribed.  He is at the ED for evaluation of elevated blood pressure readings.  Chart review reveals the patient in contact with his primary care office due to elevated blood pressure since last week.  The NP provider asked that he hold his luspatercept  infusion last week, and follow-up in the clinic.  Patient was also to be taking 10 mg of Lasix  Monday and Tuesday of last week and to continue to monitor BPs and follow with PCP for ongoing evaluation and management.  Physical Exam   Triage Vital Signs: ED Triage Vitals  Encounter Vitals Group     BP 10/13/24 1434 102/86     Girls Systolic BP Percentile --      Girls Diastolic BP Percentile --      Boys Systolic BP Percentile --      Boys Diastolic BP Percentile --      Pulse Rate 10/13/24 1434 92     Resp 10/13/24 1434 20     Temp 10/13/24 1434 (!) 97.5 F (36.4 C)     Temp Source 10/13/24 1434 Oral     SpO2 10/13/24 1434 100 %     Weight 10/13/24 1435 190 lb (86.2 kg)     Height 10/13/24 1435 5' 9 (1.753 m)     Head Circumference --      Peak Flow --      Pain Score 10/13/24 1435 0     Pain Loc --      Pain Education --      Exclude from Growth Chart --     Most  recent vital signs: Vitals:   10/13/24 1446 10/13/24 1711  BP: (!) 181/76   Pulse:    Resp:    Temp:    SpO2:  100%    General Awake, [name] distress. NAD HEENT NCAT. PERRL. EOMI. [name] rhinorrhea. Mucous membranes are moist.  CV:  Good peripheral perfusion. RRR.  [name] CCE distally.  [name] pitting edema to the LEs noted. RESP:  Normal effort. CTA.  [name] wheeze, rales, rhonchi noted ABD:  [name] distention.    ED Results / Procedures / Treatments   Labs (all labs ordered are listed, but only abnormal results are displayed) Labs Reviewed  BASIC METABOLIC PANEL WITH GFR - Abnormal; Notable for the following components:      Result Value   Glucose, Bld 110 (*)    BUN 27 (*)    Creatinine, Ser 1.59 (*)    GFR, Estimated 44 (*)    All other components within normal limits  CBC WITH DIFFERENTIAL/PLATELET - Abnormal; Notable for the following components:   RBC 3.38 (*)  Hemoglobin 10.7 (*)    HCT 32.3 (*)    RDW 26.6 (*)    nRBC 0.5 (*)    All other components within normal limits  PRO BRAIN NATRIURETIC PEPTIDE - Abnormal; Notable for the following components:   Pro Brain Natriuretic Peptide 2,838.0 (*)    All other components within normal limits     EKG  Vent. rate 65 BPM PR interval * ms QRS duration 74 ms QT/QTcB 414/430 ms P-R-T axes * -59 53 Atrial fibrillation with premature ventricular or aberrantly conducted complexes Left axis deviation Inferior infarct (cited on or before 13-Oct-2024) Anteroseptal infarct (cited on or before 13-Oct-2024) Abnormal ECG When compared with ECG of 15-Jul-2024 11:12, Nonspecific T wave abnormality [name] longer evident in Inferior leads  RADIOLOGY  I personally viewed and evaluated these images as part of my medical decision making, as well as reviewing the written report by the radiologist.  ED Provider Interpretation: [name] acute findings  DG Chest 2 View Result Date: 10/13/2024 EXAM: 2 VIEW(S) XRAY OF THE CHEST 10/13/2024 05:28:24 PM  COMPARISON: CXR 07/26/2022. CLINICAL HISTORY: elevated BNP FINDINGS: LUNGS AND PLEURA: [name] focal pulmonary opacity. [name] pleural effusion. [name] pneumothorax. HEART AND MEDIASTINUM: Mild cardiomegaly. Left atrial appendage occlusion device noted. Mild aortic arch calcification. BONES AND SOFT TISSUES: Mild thoracic spine multilevel degenerative disc changes. [name] acute osseous abnormality. IMPRESSION: 1. Mild cardiomegaly with left atrial appendage occlusion device present. Electronically signed by: Morgane Naveau MD 10/13/2024 05:32 PM EST RP Workstation: [id number]     PROCEDURES:  Critical Care performed: [name]  Procedures   MEDICATIONS ORDERED IN ED: Medications - [name] data to display   IMPRESSION / MDM / ASSESSMENT AND PLAN / ED COURSE  I reviewed the triage vital signs and the nursing notes.                              Differential diagnosis includes, but is not limited to, viral syndrome, bronchitis including COPD exacerbation, pneumonia, reactive airway disease including asthma, CHF including exacerbation with or without pulmonary/interstitial edema, pneumothorax, ACS, thoracic trauma, and pulmonary embolism.   Patient's presentation is most consistent with acute complicated illness / injury requiring diagnostic workup.  Patient's diagnosis is consistent with elevated BP readings and elevated BNP.  Patient with a history of heart failure with preserved EF, presents due to some recent elevated blood pressure readings.  His exam is overall reassuring exam and workup.  He presents in [name] acute distress, for evaluation of elevated blood pressure readings patient was recently started back on his lisinopril by his PCP.  He also takes as needed doses of 10 mg of lisinopril 2 times a week.  Patient's exam reveals an elevated BNP at greater than 2800.  His remaining labs and workup are unremarkable and otherwise reassuring.  Patient not endorsing any frank chest pain, shortness of breath, DOE, or cough.   Chest x-ray interpreted by me, shows [name] evidence of pleural effusion or cardiomegaly.  We discussed treatment options including increasing his Lasix  to daily dosing until he is evaluated by his PCP or cardiologist.  Patient is agreeable to the plan at this time, and was discharged in [name] acute distress.  [name] indication at this time for emergent management or admission as the patient is stable without evidence of acute decompensated heart failure or CHF exacerbation.  Patient will be discharged home with instructions to increase his Lasix  to daily dosing. Patient is to follow  up with his PCP as scheduled, as needed or otherwise directed. Patient is given ED precautions to return to the ED for any worsening or new symptoms.   FINAL CLINICAL IMPRESSION(S) / ED DIAGNOSES   Final diagnoses:  Elevated brain natriuretic peptide (BNP) level  Elevated blood pressure reading     Rx / DC Orders   ED Discharge Orders     None        Note:  This document was prepared using Dragon voice recognition software and may include unintentional dictation errors.    Loyd Candida LULLA Aldona, PA-C 10/13/24 2350    Bradler, Evan K, MD 10/17/24 [phone]

## 2024-10-13 NOTE — Discharge Instructions (Signed)
 Your exam, labs, and EKG are overall reassuring.  Your labs do show evidence of an elevated BNP at 2800+, which is indicative of some fluid overload.  Your x-ray was negative for any signs of any pulmonary effusion.  No EKG evidence of critical changes.  You should increase your Lasix  to daily dosing until you follow-up with your primary provider as discussed.  Continue to take the lisinopril daily and monitor blood pressure readings.  You should also monitor your weight at least weekly.

## 2024-10-13 NOTE — ED Triage Notes (Addendum)
 Pt has a sitter that sits with him during the day. Pt had a stroke 2 years ago and is non-ambulatory. Pt said his sitter checked his BP and it was 187/106 at highest at noon. Pt denies sx. Pt was restarted on lisinopril 1 week ago and has f/u with pcp on Friday. Pt unsure if he took his lisinopril today.

## 2024-10-21 ENCOUNTER — Ambulatory Visit: Admitting: Internal Medicine

## 2024-10-21 ENCOUNTER — Ambulatory Visit

## 2024-10-21 ENCOUNTER — Other Ambulatory Visit

## 2024-10-28 ENCOUNTER — Inpatient Hospital Stay: Attending: Internal Medicine

## 2024-10-28 ENCOUNTER — Encounter: Payer: Self-pay | Admitting: Internal Medicine

## 2024-10-28 VITALS — BP 165/82

## 2024-10-28 DIAGNOSIS — N184 Chronic kidney disease, stage 4 (severe): Secondary | ICD-10-CM | POA: Insufficient documentation

## 2024-10-28 DIAGNOSIS — D461 Refractory anemia with ring sideroblasts: Secondary | ICD-10-CM | POA: Insufficient documentation

## 2024-10-28 DIAGNOSIS — D469 Myelodysplastic syndrome, unspecified: Secondary | ICD-10-CM

## 2024-10-28 DIAGNOSIS — I129 Hypertensive chronic kidney disease with stage 1 through stage 4 chronic kidney disease, or unspecified chronic kidney disease: Secondary | ICD-10-CM | POA: Insufficient documentation

## 2024-10-28 LAB — CBC WITH DIFFERENTIAL (CANCER CENTER ONLY)
Abs Immature Granulocytes: 0.05 K/uL (ref 0.00–0.07)
Basophils Absolute: 0 K/uL (ref 0.0–0.1)
Basophils Relative: 1 %
Eosinophils Absolute: 0.2 K/uL (ref 0.0–0.5)
Eosinophils Relative: 3 %
HCT: 30.8 % — ABNORMAL LOW (ref 39.0–52.0)
Hemoglobin: 10.2 g/dL — ABNORMAL LOW (ref 13.0–17.0)
Immature Granulocytes: 1 %
Lymphocytes Relative: 22 %
Lymphs Abs: 1.5 K/uL (ref 0.7–4.0)
MCH: 31.8 pg (ref 26.0–34.0)
MCHC: 33.1 g/dL (ref 30.0–36.0)
MCV: 96 fL (ref 80.0–100.0)
Monocytes Absolute: 0.6 K/uL (ref 0.1–1.0)
Monocytes Relative: 9 %
Neutro Abs: 4.5 K/uL (ref 1.7–7.7)
Neutrophils Relative %: 64 %
Platelet Count: 279 K/uL (ref 150–400)
RBC: 3.21 MIL/uL — ABNORMAL LOW (ref 4.22–5.81)
RDW: 27.1 % — ABNORMAL HIGH (ref 11.5–15.5)
WBC Count: 6.8 K/uL (ref 4.0–10.5)
nRBC: 0.7 % — ABNORMAL HIGH (ref 0.0–0.2)

## 2024-10-28 LAB — CMP (CANCER CENTER ONLY)
ALT: 19 U/L (ref 0–44)
AST: 24 U/L (ref 15–41)
Albumin: 4.3 g/dL (ref 3.5–5.0)
Alkaline Phosphatase: 98 U/L (ref 38–126)
Anion gap: 11 (ref 5–15)
BUN: 32 mg/dL — ABNORMAL HIGH (ref 8–23)
CO2: 25 mmol/L (ref 22–32)
Calcium: 9.4 mg/dL (ref 8.9–10.3)
Chloride: 105 mmol/L (ref 98–111)
Creatinine: 1.79 mg/dL — ABNORMAL HIGH (ref 0.61–1.24)
GFR, Estimated: 38 mL/min — ABNORMAL LOW (ref >60)
Glucose, Bld: 114 mg/dL — ABNORMAL HIGH (ref 70–99)
Potassium: 5.2 mmol/L — ABNORMAL HIGH (ref 3.5–5.1)
Sodium: 141 mmol/L (ref 135–145)
Total Bilirubin: 2.1 mg/dL — ABNORMAL HIGH (ref 0.0–1.2)
Total Protein: 7 g/dL (ref 6.5–8.1)

## 2024-10-28 MED ORDER — LUSPATERCEPT-AAMT 75 MG ~~LOC~~ SOLR
1.0000 mg/kg | Freq: Once | SUBCUTANEOUS | Status: AC
  Administered 2024-10-28: 90 mg via SUBCUTANEOUS
  Filled 2024-10-28: qty 0.5

## 2024-11-18 ENCOUNTER — Other Ambulatory Visit

## 2024-11-18 ENCOUNTER — Ambulatory Visit: Admitting: Internal Medicine

## 2024-11-18 ENCOUNTER — Ambulatory Visit

## 2024-11-24 ENCOUNTER — Other Ambulatory Visit: Payer: Self-pay | Admitting: *Deleted

## 2024-11-24 DIAGNOSIS — D469 Myelodysplastic syndrome, unspecified: Secondary | ICD-10-CM

## 2024-11-25 ENCOUNTER — Inpatient Hospital Stay

## 2024-11-25 ENCOUNTER — Inpatient Hospital Stay (HOSPITAL_BASED_OUTPATIENT_CLINIC_OR_DEPARTMENT_OTHER): Admitting: Internal Medicine

## 2024-11-25 ENCOUNTER — Encounter: Payer: Self-pay | Admitting: Internal Medicine

## 2024-11-25 VITALS — BP 140/80 | HR 61 | Temp 97.7°F | Resp 16 | Ht 69.0 in | Wt 198.4 lb

## 2024-11-25 DIAGNOSIS — D469 Myelodysplastic syndrome, unspecified: Secondary | ICD-10-CM

## 2024-11-25 DIAGNOSIS — D461 Refractory anemia with ring sideroblasts: Secondary | ICD-10-CM | POA: Diagnosis not present

## 2024-11-25 LAB — CBC WITH DIFFERENTIAL (CANCER CENTER ONLY)
Abs Immature Granulocytes: 0.07 K/uL (ref 0.00–0.07)
Basophils Absolute: 0.1 K/uL (ref 0.0–0.1)
Basophils Relative: 1 %
Eosinophils Absolute: 0.1 K/uL (ref 0.0–0.5)
Eosinophils Relative: 2 %
HCT: 30 % — ABNORMAL LOW (ref 39.0–52.0)
Hemoglobin: 10.1 g/dL — ABNORMAL LOW (ref 13.0–17.0)
Immature Granulocytes: 1 %
Lymphocytes Relative: 15 %
Lymphs Abs: 1 K/uL (ref 0.7–4.0)
MCH: 32.4 pg (ref 26.0–34.0)
MCHC: 33.7 g/dL (ref 30.0–36.0)
MCV: 96.2 fL (ref 80.0–100.0)
Monocytes Absolute: 0.5 K/uL (ref 0.1–1.0)
Monocytes Relative: 8 %
Neutro Abs: 5.1 K/uL (ref 1.7–7.7)
Neutrophils Relative %: 73 %
Platelet Count: 296 K/uL (ref 150–400)
RBC: 3.12 MIL/uL — ABNORMAL LOW (ref 4.22–5.81)
RDW: 27.8 % — ABNORMAL HIGH (ref 11.5–15.5)
WBC Count: 6.8 K/uL (ref 4.0–10.5)
nRBC: 1.3 % — ABNORMAL HIGH (ref 0.0–0.2)

## 2024-11-25 LAB — CMP (CANCER CENTER ONLY)
ALT: 10 U/L (ref 0–44)
AST: 20 U/L (ref 15–41)
Albumin: 4.3 g/dL (ref 3.5–5.0)
Alkaline Phosphatase: 103 U/L (ref 38–126)
Anion gap: 11 (ref 5–15)
BUN: 33 mg/dL — ABNORMAL HIGH (ref 8–23)
CO2: 25 mmol/L (ref 22–32)
Calcium: 9.6 mg/dL (ref 8.9–10.3)
Chloride: 106 mmol/L (ref 98–111)
Creatinine: 1.85 mg/dL — ABNORMAL HIGH (ref 0.61–1.24)
GFR, Estimated: 37 mL/min — ABNORMAL LOW
Glucose, Bld: 132 mg/dL — ABNORMAL HIGH (ref 70–99)
Potassium: 4.9 mmol/L (ref 3.5–5.1)
Sodium: 142 mmol/L (ref 135–145)
Total Bilirubin: 1.9 mg/dL — ABNORMAL HIGH (ref 0.0–1.2)
Total Protein: 7.1 g/dL (ref 6.5–8.1)

## 2024-11-25 MED ORDER — LUSPATERCEPT-AAMT 75 MG ~~LOC~~ SOLR
1.0000 mg/kg | Freq: Once | SUBCUTANEOUS | Status: AC
  Administered 2024-11-25: 90 mg via SUBCUTANEOUS
  Filled 2024-11-25: qty 0.3

## 2024-11-25 NOTE — Progress Notes (Signed)
 Also, has been added AMLODIPINE , unsure of mg.

## 2024-11-25 NOTE — Assessment & Plan Note (Addendum)
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  Patient currently on epo agents [since April 2021]; hemoglobin between 8-9.     # Proceed with  Luspatercept - today- Labs-CBC/chemistries were reviewed with the patient. ok with Luspatercept -Given overall improvement in his hematocrit and hemoglobin- continue every 4-week injections.   # HTN- at home in 140s; BP- 150s- also on amlodipine -  monitor closely- stable  # Hx Of A.fib [OFF eliquis  secondary to intracranial bleed]- s/p watchman device- on asprin ONLY- stable  # Hx of gout- recent falre- improved- continue allopurinol;   defer to [place]- PCP- Dr.Johnston in July 2025.    # Stage kidney disease-stage III-IV [Dr.Korrapati]- on lasix  10 mg twice a week- stable  # IV access: difficulty with IV access- hold off the port placement for now- stable  PS- injection q 4 weeks; MD- q 12 weeks  # DISPOSITION:   # ok with Luspatercept  today   as per IS-  # in 4 weeks-   labs- cbc/cmp; Luspatercept ;   #  in 8 weeks-  labs- cbc/cmp; Luspatercept ;- # in 12 weeks- MD: labs- cbc/cmp; Luspatercept ;  - Dr.B

## 2024-11-25 NOTE — Progress Notes (Signed)
 [facility] CONSULT NOTE  Patient Care Team: Rudolpho Norleen BIRCH, MD as PCP - General (Internal Medicine) Cindie Ole DASEN, MD as PCP - Electrophysiology (Cardiology) O'Neal, Darryle Ned, MD as PCP - Cardiology (Cardiology) Rennie Cindy SAUNDERS, MD as Consulting Physician (Oncology)  CHIEF COMPLAINTS/PURPOSE OF CONSULTATION: MDS  Oncology History Overview Note  # EGD-none; colonoscopy-never [Cologurad x2- NEG];FEB 2021- US - abdomen-no liver disease/ Mild splenic enlargement [460cc; kidney cysts]; B12 folic acid /LDH haptoglobin normal.  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   # Hx Of A.fib [OFF eliquis  secondary to intracranial bleed]- s/p watchman device- on asprin ONLY- stable   #  small side branch IPMNs. Given small size, initially established imaging stabilityFEB 2-25-fluid signal cystic lesion in the inferior pancreatic head measuring 0.6 cm and in the dorsal pancreatic neck measuring 1.4 x 0.7 cm, unchanged. No solid component or suspicious contrast enhancement. No pancreatic ductal dilatation or surrounding inflammatory changes. These are most consistent with, and patient age,  or characterization is required. No surveillance needed. MARCH 2025-   # NGS/MOLECULAR TESTS:[facility] ON hem- May 2021- Cux-1; DNMT3A; SF3B**  # PALLIATIVE CARE EVALUATION: NA   DIAGNOSIS: MDS low risk   GOALS: Control  CURRENT/MOST RECENT THERAPY : Retacrit     MDS (myelodysplastic syndrome) (HCC)  02/18/2020 Initial Diagnosis   MDS (myelodysplastic syndrome), low grade (HCC)   01/01/2024 -   Chemotherapy   Patient is on Treatment Plan : MYELODYSPLASIA Luspatercept  q21d      HISTORY OF PRESENTING ILLNESS: Ambulating in a wheelchair.  With wife.   Ubaldo BIRCH Molt 78 y.o.  male with history of recently diagnosed low-grade MDS; A-fib status post Watchman device; off Eliquis  [history of hemorrhagic stroke] currently on Luspatercept -  Discussed the use of AI scribe software for clinical note transcription with the patient, who gave verbal consent to proceed.  History of Present Illness   ZHAMIR PIRRO is a 78 year old male with myelodysplastic syndrome who presents for routine hematology/oncology follow-up to monitor disease stability and cytopenias.  His recent hemoglobin values have been between 10.1-10.3 g/dL, with a prior low of 8.6 g/dL. He reports no new or worsening symptoms related to MDS and is comfortable with the current follow-up interval.  Yesterday, he sustained minor trauma to his knee resulting in a small skin flap and significant bleeding that soaked through his pants. He describes the bleeding as occurring in 'sheets' or layers, which was atypical for him. Hemostasis was achieved with direct pressure and a bandage. He is currently taking aspirin  and previously discontinued Eliquis  after an intracranial hemorrhage.  He denies fever, infection, or new bruising.      Review of Systems  Constitutional:  Positive for malaise/fatigue. Negative for chills, diaphoresis, fever and weight loss.  HENT:  Negative for nosebleeds and sore throat.   Eyes:  Negative for double vision.  Respiratory:  Negative for cough, hemoptysis, sputum production, shortness of breath and wheezing.   Cardiovascular:  Positive for leg swelling. Negative for chest pain, palpitations and orthopnea.  Gastrointestinal:  Negative for abdominal pain, blood in stool, constipation, diarrhea, heartburn, melena, nausea and vomiting.  Genitourinary:  Negative for dysuria, frequency and urgency.   Musculoskeletal:  Negative for back pain and joint pain.  Skin: Negative.  Negative for itching and rash.  Neurological:  Negative for dizziness, tingling, focal weakness, weakness and headaches.  Endo/Heme/Allergies:  Does not bruise/bleed easily.  Psychiatric/Behavioral:  Negative for depression. The patient is not nervous/anxious and does not have insomnia.     MEDICAL HISTORY:  Past Medical History:  Diagnosis Date   Anemia    Aortic stenosis    Arthritis    Complication of anesthesia    hard time getting bp up after knee replacement   Coronary artery disease    Diabetes mellitus without complication (HCC)    GERD (gastroesophageal reflux disease)    occ tums prn   History of hiatal hernia    Hypertension    MDS (myelodysplastic syndrome) (HCC)    Presence of Watchman left atrial appendage closure device 07/26/2022   27mm Watchman FLX with Dr. Cindie    SURGICAL HISTORY: Past Surgical History:  Procedure Laterality Date   CARPAL TUNNEL RELEASE  2012   CRANIOTOMY N/A 02/10/2022   Procedure: SUBOCCIPITAL CRANIECTOMY FOR EVACUATION OF CEREBELLAR HEMATOMA;  Surgeon: Colon Shove, MD;  Location: [facility] OR;  Service: Neurosurgery;  Laterality: N/A;   JOINT REPLACEMENT Right 2010   LEFT ATRIAL APPENDAGE OCCLUSION N/A 07/26/2022   Procedure: LEFT ATRIAL APPENDAGE OCCLUSION;  Surgeon: Cindie Ole DASEN, MD;  Location: [facility] INVASIVE CV LAB;  Service: Cardiovascular;  Laterality: N/A;   RIGHT/LEFT HEART CATH AND CORONARY ANGIOGRAPHY N/A 04/10/2023   Procedure: RIGHT/LEFT HEART CATH AND CORONARY ANGIOGRAPHY;  Surgeon: Anner Alm ORN, MD;  Location: [facility] INVASIVE CV LAB;  Service: Cardiovascular;  Laterality: N/A;   SHOULDER ARTHROSCOPY WITH ROTATOR CUFF REPAIR AND OPEN BICEPS TENODESIS Right 11/09/2019   Procedure: RIGHT SHOULDER ARTHROSCOPY WITH SUBSCAPULARIS REPAIR, SUBACROMIAL DECOMPRESSION,MINI OPEN ROTATOR CUFF REPAIR;  Surgeon: Tobie Priest, MD;  Location: [facility] ORS;  Service:  Orthopedics;  Laterality: Right;   TEE WITHOUT CARDIOVERSION N/A 07/26/2022   Procedure: TRANSESOPHAGEAL ECHOCARDIOGRAM (TEE);  Surgeon: Cindie Ole DASEN, MD;  Location: [facility] INVASIVE CV LAB;  Service: Cardiovascular;  Laterality: N/A;    SOCIAL HISTORY: Social History   Socioeconomic History   Marital status: Married    Spouse name: Not on file   Number of children: 2   Years of education: Not on file   Highest education level: Not on file  Occupational History   Occupation: Retired - Academic Librarian  Tobacco Use   Smoking status: Former    Current packs/day: 0.00    Average packs/day: 0.5 packs/day for 8.0 years (4.0 ttl pk-yrs)    Types: Cigarettes    Start date: 07/21/1970    Quit date: 07/21/1978    Years since quitting: 46.3   Smokeless tobacco: Never  Vaping Use   Vaping status: Never Used  Substance and Sexual Activity   Alcohol use: Yes    Alcohol/week: 0.0 - 1.0 standard drinks of alcohol    Comment: rare beer   Drug use: Never   Sexual activity: Not on file  Other Topics Concern   Not on file  Social History Narrative   Lives in [place]; with wife; quit smoking in early 25s; ocassional/ rare [may be 1 a month beer]. 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    Social Drivers of Health   Tobacco Use: Medium Risk (11/25/2024)   Patient History    Smoking Tobacco Use: Former    Smokeless Tobacco Use: Never    Passive Exposure: Not on Actuary Strain:  Not on file  Food Insecurity: No Food Insecurity (12/18/2022)   Hunger Vital Sign    Worried About Running Out of Food in the Last Year: Never true    Ran Out of Food in the Last Year: Never true  Transportation Needs: No Transportation Needs (12/18/2022)   PRAPARE - Administrator, Civil Service (Medical): No    Lack of Transportation (Non-Medical): No  Physical Activity: Not on file  Stress: Not on file  Social Connections: Not on file  Intimate Partner Violence: Not At Risk  (12/18/2022)   Humiliation, Afraid, Rape, and Kick questionnaire    Fear of Current or Ex-Partner: No    Emotionally Abused: No    Physically Abused: No    Sexually Abused: No  Depression (PHQ2-9): Low Risk (11/25/2024)   Depression (PHQ2-9)    PHQ-2 Score: 0  Alcohol Screen: Not on file  Housing: Unknown (10/16/2024)   Received from [facility]   Epic    Unable to Pay for Housing in the Last Year: Not on file    Number of Times Moved in the Last Year: Not on file    At any time in the past 12 months, were you homeless or living in a shelter (including now)?: No  Utilities: Not At Risk (12/18/2022)   [facility] Utilities    Threatened with loss of utilities: No  Health Literacy: Not on file    FAMILY HISTORY: Family History  Problem Relation Age of Onset   Cancer Maternal Uncle     ALLERGIES:  has no known allergies.  MEDICATIONS:  Current Outpatient Medications  Medication Sig Dispense Refill   aspirin  EC 81 MG tablet Take 1 tablet (81 mg total) by mouth daily. Swallow whole. 90 tablet 3   ezetimibe  (ZETIA ) 10 MG tablet Take 1 tablet (10 mg total) by mouth daily. 90 tablet 3   furosemide  (LASIX ) 20 MG tablet TAKE 0.5 TABLET DAILY. MAY TAKE EXTRA TABLET IF NEEDED (Patient taking differently: TAKE 0.5 TABLET MONDAY, WEDNESDAY AND FRIDAY) 90 tablet 3   lisinopril (ZESTRIL) 20 MG tablet Take 20 mg by mouth daily.     metFORMIN  (GLUCOPHAGE ) 500 MG tablet Take 500 mg by mouth 2 (two) times daily with a meal.     No current facility-administered medications for this visit.      PHYSICAL EXAMINATION:   Vitals:   11/25/24 1014 11/25/24 1054  BP: (!) 150/92 (!) 140/80  Pulse: 61   Resp: 16   Temp: 97.7 F (36.5 C)   SpO2: 97%     Filed Weights   11/25/24 1014  Weight: 198 lb 6.4 oz (90 kg)    Positive for abdominal distention.  Positive for leg swelling bilaterally.  Pitting  edema Physical Exam HENT:     Head: Normocephalic and atraumatic.      Mouth/Throat:     Pharynx: No oropharyngeal exudate.  Eyes:     Pupils: Pupils are equal, round, and reactive to light.  Cardiovascular:     Rate and Rhythm: Normal rate. Rhythm irregular.     Heart sounds: Murmur heard.  Pulmonary:     Effort: Pulmonary effort is normal. No respiratory distress.     Breath sounds: Normal breath sounds. No wheezing.  Abdominal:     General: Bowel sounds are normal. There is no distension.     Palpations: Abdomen is soft. There is no mass.     Tenderness: There is no abdominal tenderness. There is no  guarding or rebound.  Musculoskeletal:        General: No tenderness. Normal range of motion.     Cervical back: Normal range of motion and neck supple.  Skin:    General: Skin is warm.  Neurological:     Mental Status: He is alert and oriented to person, place, and time.  Psychiatric:        Mood and Affect: Affect normal.    LABORATORY DATA:  I have reviewed the data as listed Lab Results  Component Value Date   WBC 6.8 11/25/2024   HGB 10.1 (L) 11/25/2024   HCT 30.0 (L) 11/25/2024   MCV 96.2 11/25/2024   PLT 296 11/25/2024   Recent Labs    09/23/24 0906 10/13/24 1444 10/28/24 1114 11/25/24 1028  NA 138 140 141 142  K 4.3 4.6 5.2* 4.9  CL 106 105 105 106  CO2 25 24 25 25   GLUCOSE 145* 110* 114* 132*  BUN 25* 27* 32* 33*  CREATININE 1.47* 1.59* 1.79* 1.85*  CALCIUM  8.7* 9.1 9.4 9.6  GFRNONAA 49* 44* 38* 37*  PROT 6.7  --  7.0 7.1  ALBUMIN  4.0  --  4.3 4.3  AST 23  --  24 20  ALT 15  --  19 10  ALKPHOS 87  --  98 103  BILITOT 2.1*  --  2.1* 1.9*     No results found.    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  Patient currently on epo agents [since April 2021]; hemoglobin between 8-9.     # Proceed with  Luspatercept - today- Labs-CBC/chemistries were reviewed with the patient. ok with Luspatercept -Given overall improvement in  his hematocrit and hemoglobin- continue every 4-week injections.   # HTN- at home in 140s; BP- 150s- also on amlodipine -  monitor closely- stable  # Hx Of A.fib [OFF eliquis  secondary to intracranial bleed]- s/p watchman device- on asprin ONLY- stable  # Hx of gout- recent falre- improved- continue allopurinol;   defer to [place]- PCP- Dr.Johnston in July 2025.    # Stage kidney disease-stage III-IV [Dr.Korrapati]- on lasix  10 mg twice a week- stable  # IV access: difficulty with IV access- hold off the port placement for now- stable  PS- injection q 4 weeks; MD- q 12 weeks  # DISPOSITION:   # ok with Luspatercept  today   as per IS-  # in 4 weeks-   labs- cbc/cmp; Luspatercept ;   #  in 8 weeks-  labs- cbc/cmp; Luspatercept ;- # in 12 weeks- MD: labs- cbc/cmp; Luspatercept ;  - Dr.B      All questions were answered. The patient knows to call the clinic with any problems, questions or concerns.    Cindy JONELLE Joe, MD 11/25/2024 12:51 PM

## 2024-12-23 ENCOUNTER — Encounter: Payer: Self-pay | Admitting: Internal Medicine

## 2024-12-23 ENCOUNTER — Inpatient Hospital Stay

## 2024-12-23 ENCOUNTER — Inpatient Hospital Stay: Admitting: Internal Medicine

## 2024-12-23 ENCOUNTER — Inpatient Hospital Stay: Attending: Internal Medicine

## 2024-12-23 VITALS — BP 140/82 | HR 66 | Temp 98.1°F | Resp 16 | Ht 69.0 in | Wt 203.9 lb

## 2024-12-23 DIAGNOSIS — N183 Chronic kidney disease, stage 3 unspecified: Secondary | ICD-10-CM | POA: Diagnosis not present

## 2024-12-23 DIAGNOSIS — D461 Refractory anemia with ring sideroblasts: Secondary | ICD-10-CM | POA: Diagnosis present

## 2024-12-23 DIAGNOSIS — D469 Myelodysplastic syndrome, unspecified: Secondary | ICD-10-CM

## 2024-12-23 DIAGNOSIS — I129 Hypertensive chronic kidney disease with stage 1 through stage 4 chronic kidney disease, or unspecified chronic kidney disease: Secondary | ICD-10-CM | POA: Diagnosis not present

## 2024-12-23 LAB — CBC WITH DIFFERENTIAL (CANCER CENTER ONLY)
Abs Immature Granulocytes: 0.06 10*3/uL (ref 0.00–0.07)
Basophils Absolute: 0 10*3/uL (ref 0.0–0.1)
Basophils Relative: 0 %
Eosinophils Absolute: 0.2 10*3/uL (ref 0.0–0.5)
Eosinophils Relative: 2 %
HCT: 29.6 % — ABNORMAL LOW (ref 39.0–52.0)
Hemoglobin: 9.7 g/dL — ABNORMAL LOW (ref 13.0–17.0)
Immature Granulocytes: 1 %
Lymphocytes Relative: 15 %
Lymphs Abs: 1 10*3/uL (ref 0.7–4.0)
MCH: 31.9 pg (ref 26.0–34.0)
MCHC: 32.8 g/dL (ref 30.0–36.0)
MCV: 97.4 fL (ref 80.0–100.0)
Monocytes Absolute: 0.5 10*3/uL (ref 0.1–1.0)
Monocytes Relative: 7 %
Neutro Abs: 5 10*3/uL (ref 1.7–7.7)
Neutrophils Relative %: 75 %
Platelet Count: 315 10*3/uL (ref 150–400)
RBC: 3.04 MIL/uL — ABNORMAL LOW (ref 4.22–5.81)
RDW: 27.9 % — ABNORMAL HIGH (ref 11.5–15.5)
WBC Count: 6.8 10*3/uL (ref 4.0–10.5)
nRBC: 1 % — ABNORMAL HIGH (ref 0.0–0.2)

## 2024-12-23 LAB — CMP (CANCER CENTER ONLY)
ALT: 23 U/L (ref 0–44)
AST: 26 U/L (ref 15–41)
Albumin: 4.3 g/dL (ref 3.5–5.0)
Alkaline Phosphatase: 151 U/L — ABNORMAL HIGH (ref 38–126)
Anion gap: 12 (ref 5–15)
BUN: 27 mg/dL — ABNORMAL HIGH (ref 8–23)
CO2: 23 mmol/L (ref 22–32)
Calcium: 9.5 mg/dL (ref 8.9–10.3)
Chloride: 108 mmol/L (ref 98–111)
Creatinine: 1.71 mg/dL — ABNORMAL HIGH (ref 0.61–1.24)
GFR, Estimated: 40 mL/min — ABNORMAL LOW
Glucose, Bld: 143 mg/dL — ABNORMAL HIGH (ref 70–99)
Potassium: 4.5 mmol/L (ref 3.5–5.1)
Sodium: 142 mmol/L (ref 135–145)
Total Bilirubin: 1.8 mg/dL — ABNORMAL HIGH (ref 0.0–1.2)
Total Protein: 7.4 g/dL (ref 6.5–8.1)

## 2024-12-23 MED ORDER — LUSPATERCEPT-AAMT 75 MG ~~LOC~~ SOLR
1.0000 mg/kg | Freq: Once | SUBCUTANEOUS | Status: AC
  Administered 2024-12-23: 90 mg via SUBCUTANEOUS
  Filled 2024-12-23: qty 0.3

## 2024-12-23 NOTE — Assessment & Plan Note (Addendum)
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  Patient currently on epo agents [since April 2021]; hemoglobin between 8-9.     # Proceed with  Luspatercept - today- Labs-CBC/chemistries were reviewed with the patient. ok with Luspatercept -Given overall improvement in his hematocrit and hemoglobin- continue every 4-week injections.   # HTN- at home in 140s; BP- 150s- also on amlodipine -  monitor closely- stable  # Hx Of A.fib [OFF eliquis  secondary to intracranial bleed]- s/p watchman device- on asprin ONLY- stable  # Hx of gout- recent falre- improved- continue allopurinol;   defer to [place]- PCP- Dr.Johnston in July 2025.    # Stage kidney disease-stage III-IV [Dr.Korrapati]- on lasix  10 mg twice a week- stable  # IV access: difficulty with IV access- hold off the port placement for now- stable  PS- injection q 4 weeks; MD- q 12 weeks/cancel MD  # DISPOSITION:   # ok with Luspatercept  today   as per IS-  # in 4 weeks-   labs- cbc/cmp; Luspatercept ;   #  in 8 weeks-  labs- cbc/cmp; Luspatercept ;- # in 12 weeks- MD: labs- cbc/cmp; Luspatercept ;  - Dr.B

## 2024-12-23 NOTE — Progress Notes (Signed)
 [facility] CONSULT NOTE  Patient Care Team: Rudolpho Norleen BIRCH, MD as PCP - General (Internal Medicine) Cindie Ole DASEN, MD (Inactive) as PCP - Electrophysiology (Cardiology) O'Neal, Darryle Ned, MD as PCP - Cardiology (Cardiology) Rennie Cindy SAUNDERS, MD as Consulting Physician (Oncology)  CHIEF COMPLAINTS/PURPOSE OF CONSULTATION: MDS  Oncology History Overview Note  # EGD-none; colonoscopy-never [Cologurad x2- NEG];FEB 2021- US - abdomen-no liver disease/ Mild splenic enlargement [460cc; kidney cysts]; B12 folic acid /LDH haptoglobin normal.  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   # Hx Of A.fib [OFF eliquis  secondary to intracranial bleed]- s/p watchman device- on asprin ONLY- stable   #  small side branch IPMNs. Given small size, initially established imaging stabilityFEB 2-25-fluid signal cystic lesion in the inferior pancreatic head measuring 0.6 cm and in the dorsal pancreatic neck measuring 1.4 x 0.7 cm, unchanged. No solid component or suspicious contrast enhancement. No pancreatic ductal dilatation or surrounding inflammatory changes. These are most consistent with, and patient age,  or characterization is required. No surveillance needed. MARCH 2025-   # NGS/MOLECULAR TESTS:[facility] ON hem- May 2021- Cux-1; DNMT3A; SF3B**  # PALLIATIVE CARE EVALUATION: NA   DIAGNOSIS: MDS low risk   GOALS: Control  CURRENT/MOST RECENT THERAPY : Retacrit     MDS (myelodysplastic syndrome) (HCC)  02/18/2020 Initial Diagnosis   MDS (myelodysplastic syndrome), low grade (HCC)   01/01/2024 -   Chemotherapy   Patient is on Treatment Plan : MYELODYSPLASIA Luspatercept  q21d      HISTORY OF PRESENTING ILLNESS: Ambulating in a wheelchair.  With wife.   Vernon [name] 79 y.o.  male with history of recently diagnosed low-grade MDS; A-fib status post Watchman device; off Eliquis  [history of hemorrhagic stroke] currently on Luspatercept -  Discussed the use of AI scribe software for clinical note transcription with the patient, who gave verbal consent to proceed.  History of Present Illness   Vernon [name] is a 79 year old male with myelodysplastic syndrome receiving Luceparacept who presents for routine hematology follow-up.  He is currently undergoing treatment for myelodysplastic syndrome with Luceparacept and reports significant improvement in overall well-being since initiation. Hemoglobin levels have remained stable, ranging from 9 to 10 g/dL, with a recent value of 9.7 g/dL. He is aware of minor fluctuations in hemoglobin but expresses no concern, as this has been a consistent pattern.  He describes a recent episode of acute low back pain that began approximately one week ago, likely secondary to twisting during sleep. The pain persisted for several days but is now improving. He is comfortable while standing but experiences discomfort when sitting. There is no radiation, numbness, or weakness, and he has not required additional analgesics beyond his usual regimen.  Blood pressure has been elevated, averaging 140/80 mmHg, both before and after starting Luceparacept. He remains asymptomatic, with no dizziness, chest pain, or other cardiovascular complaints.      Review of Systems  Constitutional:  Positive for malaise/fatigue. Negative for chills, diaphoresis, fever and weight loss.  HENT:  Negative for nosebleeds and sore throat.   Eyes:  Negative for double vision.  Respiratory:  Negative for cough, hemoptysis, sputum production, shortness of breath and wheezing.   Cardiovascular:  Positive for leg swelling. Negative for chest pain, palpitations and orthopnea.  Gastrointestinal:  Negative for abdominal pain, blood in stool, constipation, diarrhea, heartburn, melena, nausea and vomiting.  Genitourinary:  Negative for dysuria, frequency and urgency.  Musculoskeletal:  Negative for back pain and joint pain.  Skin: Negative.  Negative for itching and rash.  Neurological:  Negative for dizziness, tingling, focal weakness, weakness and headaches.  Endo/Heme/Allergies:  Does not bruise/bleed easily.  Psychiatric/Behavioral:  Negative for depression. The patient is not nervous/anxious and does not have insomnia.     MEDICAL HISTORY:  Past Medical History:  Diagnosis Date   Anemia    Aortic stenosis    Arthritis    Complication of anesthesia    hard time getting bp up after knee replacement   Coronary artery disease    Diabetes mellitus without complication (HCC)    GERD (gastroesophageal reflux disease)    occ tums prn   History of hiatal hernia    Hypertension    MDS (myelodysplastic syndrome) (HCC)    Presence of Watchman left atrial appendage closure device 07/26/2022   27mm Watchman FLX with Dr. Cindie    SURGICAL HISTORY: Past Surgical History:  Procedure Laterality Date   CARPAL TUNNEL RELEASE  2012   CRANIOTOMY N/A 02/10/2022   Procedure: SUBOCCIPITAL CRANIECTOMY FOR EVACUATION OF CEREBELLAR HEMATOMA;  Surgeon: Colon Shove, MD;  Location: [facility] OR;  Service: Neurosurgery;  Laterality: N/A;   JOINT REPLACEMENT Right 2010   LEFT ATRIAL APPENDAGE OCCLUSION N/A 07/26/2022   Procedure: LEFT ATRIAL APPENDAGE OCCLUSION;  Surgeon: Cindie Ole DASEN, MD;  Location: [facility] INVASIVE CV LAB;  Service: Cardiovascular;  Laterality: N/A;   RIGHT/LEFT HEART CATH AND CORONARY ANGIOGRAPHY N/A 04/10/2023   Procedure: RIGHT/LEFT HEART CATH AND CORONARY ANGIOGRAPHY;  Surgeon: Anner Alm ORN, MD;  Location: [facility] INVASIVE CV LAB;  Service: Cardiovascular;  Laterality: N/A;   SHOULDER  ARTHROSCOPY WITH ROTATOR CUFF REPAIR AND OPEN BICEPS TENODESIS Right 11/09/2019   Procedure: RIGHT SHOULDER ARTHROSCOPY WITH SUBSCAPULARIS REPAIR, SUBACROMIAL DECOMPRESSION,MINI OPEN ROTATOR CUFF REPAIR;  Surgeon: Tobie Priest, MD;  Location: [facility] ORS;  Service: Orthopedics;  Laterality: Right;   TEE WITHOUT CARDIOVERSION N/A 07/26/2022   Procedure: TRANSESOPHAGEAL ECHOCARDIOGRAM (TEE);  Surgeon: Cindie Ole DASEN, MD;  Location: [facility] INVASIVE CV LAB;  Service: Cardiovascular;  Laterality: N/A;    SOCIAL HISTORY: Social History   Socioeconomic History   Marital status: Married    Spouse name: Not on file   Number of children: 2   Years of education: Not on file   Highest education level: Not on file  Occupational History   Occupation: Retired - Academic Librarian  Tobacco Use   Smoking status: Former    Current packs/day: 0.00    Average packs/day: 0.5 packs/day for 8.0 years (4.0 ttl pk-yrs)    Types: Cigarettes    Start date: 07/21/1970    Quit date: 07/21/1978    Years since quitting: 46.4   Smokeless tobacco: Never  Vaping Use   Vaping status: Never Used  Substance and Sexual Activity   Alcohol use: Yes    Alcohol/week: 0.0 - 1.0 standard drinks of alcohol    Comment: rare beer   Drug use: Never   Sexual activity: Not on file  Other Topics Concern   Not on file  Social History Narrative   Lives in [place]; with wife; quit smoking in early 34s; ocassional/ rare [may be 1 a month beer]. 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    Social  Drivers of Health   Tobacco Use: Medium Risk (12/23/2024)   Patient History    Smoking Tobacco Use: Former    Smokeless Tobacco Use: Never    Passive Exposure: Not on file  Financial Resource Strain: Not on file  Food Insecurity: No Food Insecurity (12/18/2022)   Hunger Vital Sign    Worried About Running Out of Food in the Last Year: Never true    Ran Out of Food in the Last Year: Never true  Transportation Needs: No Transportation Needs  (12/18/2022)   PRAPARE - Administrator, Civil Service (Medical): No    Lack of Transportation (Non-Medical): No  Physical Activity: Not on file  Stress: Not on file  Social Connections: Not on file  Intimate Partner Violence: Not At Risk (12/18/2022)   Humiliation, Afraid, Rape, and Kick questionnaire    Fear of Current or Ex-Partner: No    Emotionally Abused: No    Physically Abused: No    Sexually Abused: No  Depression (PHQ2-9): Medium Risk (12/23/2024)   Depression (PHQ2-9)    PHQ-2 Score: 5  Alcohol Screen: Not on file  Housing: Unknown (10/16/2024)   Received from [facility]   Epic    Unable to Pay for Housing in the Last Year: Not on file    Number of Times Moved in the Last Year: Not on file    At any time in the past 12 months, were you homeless or living in a shelter (including now)?: No  Utilities: Not At Risk (12/18/2022)   [facility] Utilities    Threatened with loss of utilities: No  Health Literacy: Not on file    FAMILY HISTORY: Family History  Problem Relation Age of Onset   Cancer Maternal Uncle     ALLERGIES:  has no known allergies.  MEDICATIONS:  Current Outpatient Medications  Medication Sig Dispense Refill   amLODipine  (NORVASC ) 10 MG tablet Take 10 mg by mouth daily. (Patient taking differently: Take 20 mg by mouth daily.)     aspirin  EC 81 MG tablet Take 1 tablet (81 mg total) by mouth daily. Swallow whole. 90 tablet 3   ezetimibe  (ZETIA ) 10 MG tablet Take 1 tablet (10 mg total) by mouth daily. 90 tablet 3   furosemide  (LASIX ) 20 MG tablet TAKE 0.5 TABLET DAILY. MAY TAKE EXTRA TABLET IF NEEDED (Patient taking differently: TAKE 0.5 TABLET MONDAY, WEDNESDAY AND FRIDAY) 90 tablet 3   lisinopril (ZESTRIL) 20 MG tablet Take 20 mg by mouth daily.     metFORMIN  (GLUCOPHAGE ) 500 MG tablet Take 500 mg by mouth 2 (two) times daily with a meal.     No current facility-administered medications for this visit.   Facility-Administered  Medications Ordered in Other Visits  Medication Dose Route Frequency Provider Last Rate Last Admin   luspatercept -aamt (REBLOZYL ) subcutaneous injection 90 mg  1 mg/kg (Treatment Plan Recorded) Subcutaneous Once Ethelean Colla R, MD          PHYSICAL EXAMINATION:   Vitals:   12/23/24 0909  BP: (!) 140/82  Pulse: 66  Resp: 16  Temp: 98.1 F (36.7 C)  SpO2: 99%    Filed Weights   12/23/24 0909  Weight: 203 lb 14.4 oz (92.5 kg)    Positive for abdominal distention.  Positive for leg swelling bilaterally.  Pitting  edema Physical Exam HENT:     Head: Normocephalic and atraumatic.     Mouth/Throat:     Pharynx: No oropharyngeal exudate.  Eyes:  Pupils: Pupils are equal, round, and reactive to light.  Cardiovascular:     Rate and Rhythm: Normal rate. Rhythm irregular.     Heart sounds: Murmur heard.  Pulmonary:     Effort: Pulmonary effort is normal. No respiratory distress.     Breath sounds: Normal breath sounds. No wheezing.  Abdominal:     General: Bowel sounds are normal. There is no distension.     Palpations: Abdomen is soft. There is no mass.     Tenderness: There is no abdominal tenderness. There is no guarding or rebound.  Musculoskeletal:        General: No tenderness. Normal range of motion.     Cervical back: Normal range of motion and neck supple.  Skin:    General: Skin is warm.  Neurological:     Mental Status: He is alert and oriented to person, place, and time.  Psychiatric:        Mood and Affect: Affect normal.    LABORATORY DATA:  I have reviewed the data as listed Lab Results  Component Value Date   WBC 6.8 12/23/2024   HGB 9.7 (L) 12/23/2024   HCT 29.6 (L) 12/23/2024   MCV 97.4 12/23/2024   PLT 315 12/23/2024   Recent Labs    10/28/24 1114 11/25/24 1028 12/23/24 0914  NA 141 142 142  K 5.2* 4.9 4.5  CL 105 106 108  CO2 25 25 23   GLUCOSE 114* 132* 143*  BUN 32* 33* 27*  CREATININE 1.79* 1.85* 1.71*  CALCIUM  9.4 9.6  9.5  GFRNONAA 38* 37* 40*  PROT 7.0 7.1 7.4  ALBUMIN  4.3 4.3 4.3  AST 24 20 26   ALT 19 10 23   ALKPHOS 98 103 151*  BILITOT 2.1* 1.9* 1.8*     No results found.    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  Patient currently on epo agents [since April 2021]; hemoglobin between 8-9.     # Proceed with  Luspatercept - today- Labs-CBC/chemistries were reviewed with the patient. ok with Luspatercept -Given overall improvement in his hematocrit and hemoglobin- continue every 4-week injections.   # HTN- at home in 140s; BP- 150s- also on amlodipine -  monitor closely- stable  # Hx Of A.fib [OFF eliquis  secondary to intracranial bleed]- s/p watchman device- on asprin ONLY- stable  # Hx of gout- recent falre- improved- continue allopurinol;   defer to [place]- PCP- Dr.Johnston in July 2025.    # Stage kidney disease-stage III-IV [Dr.Korrapati]- on lasix  10 mg twice a week- stable  # IV access: difficulty with IV access- hold off the port placement for now- stable  PS- injection q 4 weeks; MD- q 12 weeks/cancel MD  # DISPOSITION:   # ok with Luspatercept  today   as per IS-  # in 4 weeks-   labs- cbc/cmp; Luspatercept ;   #  in 8 weeks-  labs- cbc/cmp; Luspatercept ;- # in 12 weeks- MD: labs- cbc/cmp; Luspatercept ;  - Dr.B      All questions were answered. The patient knows to call the clinic with any problems, questions or concerns.    Cindy JONELLE Joe, MD 12/23/2024 10:25 AM

## 2024-12-23 NOTE — Progress Notes (Signed)
 No concerns today.

## 2025-01-20 ENCOUNTER — Inpatient Hospital Stay: Admitting: Internal Medicine

## 2025-01-20 ENCOUNTER — Inpatient Hospital Stay

## 2025-02-17 ENCOUNTER — Inpatient Hospital Stay

## 2025-02-17 ENCOUNTER — Inpatient Hospital Stay: Admitting: Internal Medicine

## 2025-03-17 ENCOUNTER — Inpatient Hospital Stay: Admitting: Internal Medicine

## 2025-03-17 ENCOUNTER — Inpatient Hospital Stay

## 2025-04-14 ENCOUNTER — Inpatient Hospital Stay

## 2025-04-14 ENCOUNTER — Inpatient Hospital Stay: Admitting: Internal Medicine

## 2025-06-03 ENCOUNTER — Ambulatory Visit: Admitting: Neurology

## 2025-06-28 ENCOUNTER — Other Ambulatory Visit (HOSPITAL_COMMUNITY)
